# Patient Record
Sex: Male | Born: 1945 | Race: White | Hispanic: No | Marital: Married | Smoking: Former smoker
Health system: Southern US, Community
[De-identification: ages and names within clinical notes are randomized; demographics above are authoritative.]

## PROBLEM LIST (undated history)

## (undated) DIAGNOSIS — E119 Type 2 diabetes mellitus without complications: Secondary | ICD-10-CM

## (undated) DIAGNOSIS — I252 Old myocardial infarction: Secondary | ICD-10-CM

## (undated) DIAGNOSIS — R0600 Dyspnea, unspecified: Secondary | ICD-10-CM

## (undated) DIAGNOSIS — R350 Frequency of micturition: Secondary | ICD-10-CM

## (undated) DIAGNOSIS — I4819 Other persistent atrial fibrillation: Secondary | ICD-10-CM

## (undated) DIAGNOSIS — I219 Acute myocardial infarction, unspecified: Secondary | ICD-10-CM

## (undated) DIAGNOSIS — I10 Essential (primary) hypertension: Secondary | ICD-10-CM

## (undated) DIAGNOSIS — E785 Hyperlipidemia, unspecified: Secondary | ICD-10-CM

## (undated) DIAGNOSIS — M5430 Sciatica, unspecified side: Secondary | ICD-10-CM

## (undated) DIAGNOSIS — I2510 Atherosclerotic heart disease of native coronary artery without angina pectoris: Secondary | ICD-10-CM

## (undated) DIAGNOSIS — R319 Hematuria, unspecified: Secondary | ICD-10-CM

## (undated) DIAGNOSIS — K760 Fatty (change of) liver, not elsewhere classified: Secondary | ICD-10-CM

## (undated) DIAGNOSIS — I700 Atherosclerosis of aorta: Secondary | ICD-10-CM

## (undated) DIAGNOSIS — N183 Chronic kidney disease, stage 3 unspecified: Secondary | ICD-10-CM

## (undated) DIAGNOSIS — I509 Heart failure, unspecified: Secondary | ICD-10-CM

## (undated) DIAGNOSIS — I482 Chronic atrial fibrillation, unspecified: Secondary | ICD-10-CM

## (undated) DIAGNOSIS — M1990 Unspecified osteoarthritis, unspecified site: Secondary | ICD-10-CM

## (undated) DIAGNOSIS — Z7901 Long term (current) use of anticoagulants: Secondary | ICD-10-CM

## (undated) DIAGNOSIS — R9341 Abnormal radiologic findings on diagnostic imaging of renal pelvis, ureter, or bladder: Secondary | ICD-10-CM

## (undated) DIAGNOSIS — Z8619 Personal history of other infectious and parasitic diseases: Secondary | ICD-10-CM

## (undated) DIAGNOSIS — I5032 Chronic diastolic (congestive) heart failure: Secondary | ICD-10-CM

## (undated) DIAGNOSIS — C801 Malignant (primary) neoplasm, unspecified: Secondary | ICD-10-CM

## (undated) DIAGNOSIS — G4700 Insomnia, unspecified: Secondary | ICD-10-CM

## (undated) DIAGNOSIS — R0609 Other forms of dyspnea: Secondary | ICD-10-CM

## (undated) DIAGNOSIS — F419 Anxiety disorder, unspecified: Secondary | ICD-10-CM

## (undated) HISTORY — PX: KIDNEY SURGERY: SHX687

## (undated) HISTORY — DX: Other persistent atrial fibrillation: I48.19

## (undated) HISTORY — PX: COLONOSCOPY: SHX174

## (undated) HISTORY — PX: EYE SURGERY: SHX253

## (undated) HISTORY — DX: Atherosclerotic heart disease of native coronary artery without angina pectoris: I25.10

## (undated) HISTORY — PX: KNEE ARTHROSCOPY: SHX127

## (undated) HISTORY — PX: TRANSTHORACIC ECHOCARDIOGRAM: SHX275

---

## 2013-04-19 DIAGNOSIS — G4700 Insomnia, unspecified: Secondary | ICD-10-CM | POA: Insufficient documentation

## 2013-04-19 DIAGNOSIS — M25669 Stiffness of unspecified knee, not elsewhere classified: Secondary | ICD-10-CM | POA: Insufficient documentation

## 2013-04-19 DIAGNOSIS — R350 Frequency of micturition: Secondary | ICD-10-CM | POA: Insufficient documentation

## 2013-04-19 DIAGNOSIS — R972 Elevated prostate specific antigen [PSA]: Secondary | ICD-10-CM | POA: Insufficient documentation

## 2013-04-19 DIAGNOSIS — F411 Generalized anxiety disorder: Secondary | ICD-10-CM | POA: Insufficient documentation

## 2013-04-19 DIAGNOSIS — C61 Malignant neoplasm of prostate: Secondary | ICD-10-CM | POA: Insufficient documentation

## 2013-04-19 DIAGNOSIS — G44209 Tension-type headache, unspecified, not intractable: Secondary | ICD-10-CM | POA: Insufficient documentation

## 2013-04-19 DIAGNOSIS — Z8546 Personal history of malignant neoplasm of prostate: Secondary | ICD-10-CM | POA: Insufficient documentation

## 2013-04-19 DIAGNOSIS — M5430 Sciatica, unspecified side: Secondary | ICD-10-CM | POA: Insufficient documentation

## 2013-11-13 DIAGNOSIS — I219 Acute myocardial infarction, unspecified: Secondary | ICD-10-CM

## 2013-11-13 HISTORY — DX: Acute myocardial infarction, unspecified: I21.9

## 2014-02-03 ENCOUNTER — Encounter (HOSPITAL_COMMUNITY): Payer: Self-pay | Admitting: Emergency Medicine

## 2014-02-03 ENCOUNTER — Inpatient Hospital Stay (HOSPITAL_COMMUNITY)
Admission: EM | Admit: 2014-02-03 | Discharge: 2014-02-05 | DRG: 282 | Disposition: A | Discharge status: Home / Self Care | Payer: Medicare Other | Attending: Cardiology | Admitting: Cardiology

## 2014-02-03 ENCOUNTER — Emergency Department (HOSPITAL_COMMUNITY): Payer: Medicare Other

## 2014-02-03 DIAGNOSIS — I252 Old myocardial infarction: Secondary | ICD-10-CM | POA: Diagnosis present

## 2014-02-03 DIAGNOSIS — I2541 Coronary artery aneurysm: Secondary | ICD-10-CM | POA: Diagnosis present

## 2014-02-03 DIAGNOSIS — Z79899 Other long term (current) drug therapy: Secondary | ICD-10-CM

## 2014-02-03 DIAGNOSIS — I200 Unstable angina: Secondary | ICD-10-CM | POA: Diagnosis present

## 2014-02-03 DIAGNOSIS — E785 Hyperlipidemia, unspecified: Secondary | ICD-10-CM | POA: Diagnosis present

## 2014-02-03 DIAGNOSIS — R079 Chest pain, unspecified: Secondary | ICD-10-CM | POA: Insufficient documentation

## 2014-02-03 DIAGNOSIS — I2510 Atherosclerotic heart disease of native coronary artery without angina pectoris: Secondary | ICD-10-CM | POA: Diagnosis present

## 2014-02-03 DIAGNOSIS — N289 Disorder of kidney and ureter, unspecified: Secondary | ICD-10-CM | POA: Diagnosis present

## 2014-02-03 DIAGNOSIS — I214 Non-ST elevation (NSTEMI) myocardial infarction: Principal | ICD-10-CM | POA: Diagnosis present

## 2014-02-03 DIAGNOSIS — I10 Essential (primary) hypertension: Secondary | ICD-10-CM | POA: Diagnosis present

## 2014-02-03 HISTORY — DX: Unspecified osteoarthritis, unspecified site: M19.90

## 2014-02-03 HISTORY — DX: Essential (primary) hypertension: I10

## 2014-02-03 LAB — BASIC METABOLIC PANEL
BUN: 27 mg/dL — ABNORMAL HIGH (ref 6–23)
CALCIUM: 9.4 mg/dL (ref 8.4–10.5)
CO2: 26 mEq/L (ref 19–32)
CREATININE: 1.46 mg/dL — AB (ref 0.50–1.35)
Chloride: 99 mEq/L (ref 96–112)
GFR calc non Af Amer: 48 mL/min — ABNORMAL LOW (ref >90)
GFR, EST AFRICAN AMERICAN: 56 mL/min — AB (ref >90)
Glucose, Bld: 168 mg/dL — ABNORMAL HIGH (ref 70–99)
Potassium: 4 mEq/L (ref 3.7–5.3)
Sodium: 139 mEq/L (ref 137–147)

## 2014-02-03 LAB — I-STAT TROPONIN, ED: TROPONIN I, POC: 0.17 ng/mL — AB (ref 0.00–0.08)

## 2014-02-03 LAB — CBC
HEMATOCRIT: 42.4 % (ref 39.0–52.0)
Hemoglobin: 14.9 g/dL (ref 13.0–17.0)
MCH: 30.3 pg (ref 26.0–34.0)
MCHC: 35.1 g/dL (ref 30.0–36.0)
MCV: 86.2 fL (ref 78.0–100.0)
PLATELETS: 227 10*3/uL (ref 150–400)
RBC: 4.92 MIL/uL (ref 4.22–5.81)
RDW: 13.1 % (ref 11.5–15.5)
WBC: 10 10*3/uL (ref 4.0–10.5)

## 2014-02-03 LAB — TROPONIN I
TROPONIN I: 0.37 ng/mL — AB (ref <0.30)
Troponin I: 0.41 ng/mL (ref <0.30)

## 2014-02-03 MED ORDER — ASPIRIN 81 MG PO CHEW
81.0000 mg | CHEWABLE_TABLET | ORAL | Status: AC
  Administered 2014-02-04: 81 mg via ORAL
  Filled 2014-02-03: qty 1

## 2014-02-03 MED ORDER — HEPARIN (PORCINE) IN NACL 100-0.45 UNIT/ML-% IJ SOLN
1350.0000 [IU]/h | INTRAMUSCULAR | Status: DC
  Administered 2014-02-03: 1150 [IU]/h via INTRAVENOUS
  Filled 2014-02-03 (×2): qty 250

## 2014-02-03 MED ORDER — HEPARIN SODIUM (PORCINE) 5000 UNIT/ML IJ SOLN: 60.0000 [IU]/kg | Freq: Once | INTRAMUSCULAR | Status: DC

## 2014-02-03 MED ORDER — NITROGLYCERIN 0.4 MG SL SUBL: 0.4000 mg | SUBLINGUAL_TABLET | SUBLINGUAL | Status: DC | PRN

## 2014-02-03 MED ORDER — METOPROLOL TARTRATE 25 MG PO TABS
25.0000 mg | ORAL_TABLET | Freq: Two times a day (BID) | ORAL | Status: DC
  Administered 2014-02-03 – 2014-02-05 (×4): 25 mg via ORAL
  Filled 2014-02-03 (×5): qty 1

## 2014-02-03 MED ORDER — ASPIRIN EC 81 MG PO TBEC
81.0000 mg | DELAYED_RELEASE_TABLET | Freq: Every day | ORAL | Status: DC
  Administered 2014-02-04 – 2014-02-05 (×2): 81 mg via ORAL
  Filled 2014-02-03 (×2): qty 1

## 2014-02-03 MED ORDER — ASPIRIN 325 MG PO TABS
325.0000 mg | ORAL_TABLET | Freq: Once | ORAL | Status: AC
  Administered 2014-02-03: 325 mg via ORAL
  Filled 2014-02-03: qty 1

## 2014-02-03 MED ORDER — SODIUM CHLORIDE 0.9 % IJ SOLN: 3.0000 mL | Freq: Two times a day (BID) | INTRAMUSCULAR | Status: DC

## 2014-02-03 MED ORDER — SODIUM CHLORIDE 0.9 % IV SOLN
1.0000 mL/kg/h | INTRAVENOUS | Status: DC
  Administered 2014-02-04 (×2): 1 mL/kg/h via INTRAVENOUS

## 2014-02-03 MED ORDER — HEPARIN BOLUS VIA INFUSION
4000.0000 [IU] | Freq: Once | INTRAVENOUS | Status: AC
  Administered 2014-02-03: 4000 [IU] via INTRAVENOUS
  Filled 2014-02-03: qty 4000

## 2014-02-03 MED ORDER — SODIUM CHLORIDE 0.9 % IV SOLN: 250.0000 mL | INTRAVENOUS | Status: DC | PRN

## 2014-02-03 MED ORDER — ZOLPIDEM TARTRATE 5 MG PO TABS
5.0000 mg | ORAL_TABLET | Freq: Every day | ORAL | Status: DC
  Administered 2014-02-03 – 2014-02-04 (×2): 5 mg via ORAL
  Filled 2014-02-03 (×2): qty 1

## 2014-02-03 MED ORDER — SODIUM CHLORIDE 0.9 % IJ SOLN: 3.0000 mL | INTRAMUSCULAR | Status: DC | PRN

## 2014-02-03 MED ORDER — ASPIRIN 81 MG PO CHEW: 81.0000 mg | CHEWABLE_TABLET | ORAL | Status: DC

## 2014-02-03 NOTE — H&P (Signed)
CARDIOLOGY ADMISSION NOTE  Patient ID: Adam Harris MRN: [id number] DOB/AGE: 1946-10-20 68 y.o.  Admit date: 02/03/2014 Primary Physician   [place] [facility] Primary Cardiologist   None Chief Complaint    Chest pain  HPI:  The patient presents for evaluation of chest pain.  He actually had chest pain about 2 weeks ago.  This happened after he walked up some stairs.  Is was 4/10and mid to left chest.  He had no associated symptoms.  He went to bed and it went away.  Two days ago, again climbing the stairs, he had a similar episode although less severe.  He decided today to have an appointment with his primary MD.  Apparently a troponin was sent and was elevated.  He was told to come to the ED.  Here again the troponin was elevated at 0.17.    However, the patient has had no symptoms in the past two days.  Denies ongoing dyspnea, PND or orthopnea.    Past Medical History  Diagnosis Date  . Hypertension   . Arthritis     History reviewed. No pertinent past surgical history.  No Known Allergies  Prior to Admission medications   Medication Sig Start Date End Date Taking? Authorizing Provider  diphenhydramine-acetaminophen (TYLENOL PM) 25-500 MG TABS Take 1 tablet by mouth at bedtime. Take every night for sleep per patient   Yes Historical Provider, MD  ibuprofen (ADVIL,MOTRIN) 200 MG tablet Take 1,200 mg by mouth every 6 (six) hours as needed. Take 6 tablets every day per patient for knee pain   Yes Historical Provider, MD  Olmesartan-Amlodipine-HCTZ (TRIBENZOR) 40-5-25 MG TABS Take 1 tablet by mouth daily.   Yes Historical Provider, MD   History   Social History  . Marital Status: Married    Spouse Name: N/A    Number of Children: N/A  . Years of Education: N/A   Occupational History  . Not on file.   Social History Main Topics  . Smoking status: Never Smoker   . Smokeless tobacco: Not on file  . Alcohol Use: No  . Drug Use: Not on file  . Sexual Activity: Not on  file   Other Topics Concern  . Not on file   Social History Narrative  . No narrative on file    History reviewed. No pertinent family history.   ROS:  Positive for knee pain.  Otherwise as stated in the HPI and negative for all other systems.  Physical Exam: Blood pressure 113/69, pulse 65, resp. rate 17, height 5\' 11"  (1.803 m), weight 242 lb (109.77 kg), SpO2 98.00%.  GENERAL:  Well appearing HEENT:  Pupils equal round and reactive, fundi not visualized, oral mucosa unremarkable NECK:  No jugular venous distention, waveform within normal limits, carotid upstroke brisk and symmetric, no bruits, no thyromegaly LYMPHATICS:  No cervical, inguinal adenopathy LUNGS:  Clear to auscultation bilaterally BACK:  No CVA tenderness CHEST:  Unremarkable HEART:  PMI not displaced or sustained,S1 and S2 within normal limits, no S3, no S4, no clicks, no rubs, no murmurs ABD:  Flat, positive bowel sounds normal in frequency in pitch, no bruits, no rebound, no guarding, no midline pulsatile mass, no hepatomegaly, no splenomegaly EXT:  2 plus pulses throughout, no edema, no cyanosis no clubbing SKIN:  No rashes no nodules NEURO:  Cranial nerves II through XII grossly intact, motor grossly intact throughout PSYCH:  Cognitively intact, oriented to person place and time   Labs: Lab Results  Component  Value Date   BUN 27* 02/03/2014   Lab Results  Component Value Date   CREATININE 1.46* 02/03/2014   Lab Results  Component Value Date   NA 139 02/03/2014   K 4.0 02/03/2014   CL 99 02/03/2014   CO2 26 02/03/2014   Lab Results  Component Value Date   TROPONINI 0.37* 02/03/2014   Lab Results  Component Value Date   WBC 10.0 02/03/2014   HGB 14.9 02/03/2014   HCT 42.4 02/03/2014   MCV 86.2 02/03/2014   PLT 227 02/03/2014     Radiology:  CXR:  The cardiac and mediastinal silhouettes are within normal limits.  The lungs are normally inflated. No airspace consolidation, pleural  effusion, or  pulmonary edema is identified. There is no  pneumothorax.  No acute osseous abnormality identified. Mild multilevel  degenerative disc disease seen within the visualized spine.   EKG:  Sinus rhythm, rate 70, axis within normal limits, intervals within normal limits, no acute ST-T wave changes.  ASSESSMENT AND PLAN:    CHEST PAIN:  The pain has typical and atypical features.  However, his cardiac enzymes are elevated.  Therefore he will need cardiac cath in the AM.  The patient understands that risks included but are not limited to stroke (1 in 1000), death (1 in 1000), kidney failure [usually temporary] (1 in 500), bleeding (1 in 200), allergic reaction [possibly serious] (1 in 200).  The patient understands and agrees to proceed.   HTN:  I will hold his ARB/HCTZ pending cath.    ELEVATED CREATININE:   As above.  I will check a creat in the AM.     Signed: Minus Breeding 02/03/2014, 6:00 PM

## 2014-02-03 NOTE — ED Notes (Signed)
NOTIFIED DR. ZACKOWSKI IN PERSON OF PATIENTS PANIC LAB RESULTS OF I-STAT TROPONIN @16 :05 PM ,02/03/2014.

## 2014-02-03 NOTE — Progress Notes (Signed)
ANTICOAGULATION CONSULT NOTE - Initial Consult  Pharmacy Consult for Heparin Indication: chest pain/ACS  No Known Allergies  Patient Measurements: Height: 5\' 11"  (180.3 cm) (Patient reported) Weight: 242 lb (109.77 kg) (Patient reported ) IBW/kg (Calculated) : 75.3 Heparin Dosing Weight: 99 kg   Vital Signs: BP: 113/69 mmHg (03/24 1715) Pulse Rate: 65 (03/24 1715)  Labs:  Recent Labs  02/03/14 1538 02/03/14 1612  HGB 14.9  --   HCT 42.4  --   PLT 227  --   CREATININE 1.46*  --   TROPONINI  --  0.37*    Estimated Creatinine Clearance: 61.9 ml/min (by C-G formula based on Cr of 1.46).   Medical History: Past Medical History  Diagnosis Date  . Hypertension   . Arthritis     Medications:   (Not in a hospital admission)  Assessment: 89 YOM with chest pain to start heparin for ACS/STEMI. Troponin was elevated at 0.17. Hgb and Plt wnl. He is not on any chronic anticoagulation prior to admission.   Goal of Therapy:  Heparin level 0.3-0.7 units/ml Monitor platelets by anticoagulation protocol: Yes   Plan:  Give 4000 units bolus x 1 Start heparin infusion at 1150 units/hr Check anti-Xa level in 6 hours and daily while on heparin Continue to monitor H&H and platelets  Albertina Parr, PharmD.  Clinical Pharmacist Pager [phone]

## 2014-02-03 NOTE — ED Notes (Signed)
Per pt sts that he has been having episodes of chest pain and heaviness intermittently over the past few week. sts some heaviness now. Denies SOB.

## 2014-02-03 NOTE — ED Notes (Signed)
MD at bedside.  Cardiologist.

## 2014-02-03 NOTE — ED Notes (Signed)
PA Student at bedside

## 2014-02-03 NOTE — ED Provider Notes (Signed)
CSN: [id number]     Arrival date & time 02/03/14  1515 History   First MD Initiated Contact with Patient 02/03/14 1546     Chief Complaint  Patient presents with  . Chest Pain     (Consider location/radiation/quality/duration/timing/severity/associated sxs/prior Treatment) HPI Comments: Patient is a 68 year old male with past medical history of hypertension and arthritis who presents to the emergency department from his primary care physician's office with intermittent chest pain x2 weeks. Patient states 2 weeks ago he had an episode of midsternal chest pain described as a cramping, radiating towards his left shoulder rated 4/10. Chest pain lasted about 30 minutes and subsided on its own. He was asymptomatic until 2 days ago when the chest pain returned, lasting about 30 minutes and subsiding without any intervention. Pain has not returned since, however he decided to be checked out as primary care physician's office, he had blood work done and was advised to go to the emergency department because of an abnormal lab value. Denies ever having chest pain like this in the past. Denies associated shortness of breath, nausea, vomiting or diaphoresis, no recent cough. Denies any cardiac history. Nonsmoker.  Patient is a 68 y.o. male presenting with chest pain. The history is provided by the patient and the spouse.  Chest Pain   Past Medical History  Diagnosis Date  . Hypertension   . Arthritis    Past Surgical History  Procedure Laterality Date  . Knee arthroscopy     Family History  Problem Relation Age of Onset  . Hypertension Mother    History  Substance Use Topics  . Smoking status: Never Smoker   . Smokeless tobacco: Not on file  . Alcohol Use: No    Review of Systems  Cardiovascular: Positive for chest pain.  All other systems reviewed and are negative.      Allergies  Review of patient's allergies indicates no known allergies.  Home Medications   Current Outpatient  Rx  Name  Route  Sig  Dispense  Refill  . diphenhydramine-acetaminophen (TYLENOL PM) 25-500 MG TABS   Oral   Take 1 tablet by mouth at bedtime. Take every night for sleep per patient         . ibuprofen (ADVIL,MOTRIN) 200 MG tablet   Oral   Take 1,200 mg by mouth every 6 (six) hours as needed. Take 6 tablets every day per patient for knee pain         . Olmesartan-Amlodipine-HCTZ (TRIBENZOR) 40-5-25 MG TABS   Oral   Take 1 tablet by mouth daily.          BP 132/77  Pulse 70  Resp 18  Ht 5\' 11"  (1.803 m)  Wt 242 lb (109.77 kg)  BMI 33.77 kg/m2  SpO2 99% Physical Exam  Nursing note and vitals reviewed. Constitutional: He is oriented to person, place, and time. He appears well-developed and well-nourished. No distress.  HENT:  Head: Normocephalic and atraumatic.  Mouth/Throat: Oropharynx is clear and moist.  Eyes: Conjunctivae and EOM are normal. Pupils are equal, round, and reactive to light.  Neck: Normal range of motion. Neck supple. No JVD present.  Cardiovascular: Normal rate, regular rhythm, normal heart sounds and intact distal pulses.   No extremity edema.  Pulmonary/Chest: Effort normal and breath sounds normal. No respiratory distress.  Abdominal: Soft. Bowel sounds are normal. There is no tenderness.  Musculoskeletal: Normal range of motion. He exhibits no edema.  Neurological: He is alert and oriented to person,  place, and time. He has normal strength. No sensory deficit.  Speech fluent, goal oriented. Moves limbs without ataxia. Equal grip strength bilateral.  Skin: Skin is warm and dry. He is not diaphoretic.  Psychiatric: He has a normal mood and affect. His behavior is normal.    ED Course  Procedures (including critical care time) Labs Review Labs Reviewed  BASIC METABOLIC PANEL - Abnormal; Notable for the following:    Glucose, Bld 168 (*)    BUN 27 (*)    Creatinine, Ser 1.46 (*)    GFR calc non Af Amer 48 (*)    GFR calc Af Amer 56 (*)     All other components within normal limits  TROPONIN I - Abnormal; Notable for the following:    Troponin I 0.37 (*)    All other components within normal limits  I-STAT TROPOININ, ED - Abnormal; Notable for the following:    Troponin i, poc 0.17 (*)    All other components within normal limits  CBC  HEPARIN LEVEL (UNFRACTIONATED)   Imaging Review Dg Chest 2 View  02/03/2014   CLINICAL DATA:  Left upper chest pain, hypertension  EXAM: CHEST  2 VIEW  COMPARISON:  None available  FINDINGS: The cardiac and mediastinal silhouettes are within normal limits.  The lungs are normally inflated. No airspace consolidation, pleural effusion, or pulmonary edema is identified. There is no pneumothorax.  No acute osseous abnormality identified. Mild multilevel degenerative disc disease seen within the visualized spine.  IMPRESSION: No active cardiopulmonary disease.   Electronically Signed   By: Jeannine Boga M.D.   On: 02/03/2014 16:05     EKG Interpretation   Date/Time:  Tuesday February 03 2014 16:20:29 EDT Ventricular Rate:  70 PR Interval:  182 QRS Duration: 84 QT Interval:  391 QTC Calculation: 422 R Axis:   57 Text Interpretation:  Sinus rhythm Nonspecific repol abnormality, lateral  leads No significant change since last tracing Confirmed by ZACKOWSKI  MD,  SCOTT [id number]) on 02/03/2014 4:27:12 PM      MDM   Final diagnoses:  NSTEMI (non-ST elevated myocardial infarction)   Presenting with 2 episodes of chest pain over the past 2 weeks. Currently asymptomatic. He is well appearing and in no apparent distress. Vital signs stable. Labs obtained in triage prior to patient being seen, i-STAT troponin I elevated at 0.17. EKG showing nonspecific ST abnormalities in leads 2, aVR, V1, V4, V5 and V6. EKG repeated and unchanged. Plan to obtain regular lab troponin. Case discussed with attending Dr. Rogene Houston who agrees with plan of care.  Regular lab troponin elevated at 0.37. Heparin bolus and  drip started. Cardiology consult is appreciated, patient admitted by Dr. Percival Spanish for cardiac catheterization tomorrow.  Illene Labrador, PA-C 02/03/14 [phone]

## 2014-02-03 NOTE — ED Provider Notes (Addendum)
Medical screening examination/treatment/procedure(s) were conducted as a shared visit with non-physician practitioner(s) and myself.  I personally evaluated the patient during the encounter.   EKG Interpretation   Date/Time:  Tuesday February 03 2014 16:20:29 EDT Ventricular Rate:  70 PR Interval:  182 QRS Duration: 84 QT Interval:  391 QTC Calculation: 422 R Axis:   57 Text Interpretation:  Sinus rhythm Nonspecific repol abnormality, lateral  leads No significant change since last tracing Confirmed by Latissa Frick  MD,  Markelle Asaro [phone]) on 02/03/2014 4:27:12 PM       Patient seen by me. The patient referred in from primary care doctor's office after lab work was suspicious for abnormal cardiac enzymes. Patient had some sniffing and chest pain about 2 weeks ago had some mild discomfort yesterday and today. No history of coronary disease. Does have a history of hypertension. The patient's point-of-care troponin here was elevated. Patient further troponin also came back elevated. His be consistent with a non-STEMI. We do not have significant pain at this time. Patient given aspirin. We'll hold on the nitroglycerin but we'll start heparin. We'll discuss with cardiology. This all would be consistent with a non-STEMI. EKG as above did have some subtle changes in V1 with some ST segment elevation and some subtle depression in the lateral leads and inferior leads. No old EKG to compare it with. We did do 2 EKGs here and there was no significant change in them.    Results for orders placed during the hospital encounter of 02/03/14  CBC      Result Value Ref Range   WBC 10.0  4.0 - 10.5 K/uL   RBC 4.92  4.22 - 5.81 MIL/uL   Hemoglobin 14.9  13.0 - 17.0 g/dL   HCT 42.4  39.0 - 52.0 %   MCV 86.2  78.0 - 100.0 fL   MCH 30.3  26.0 - 34.0 pg   MCHC 35.1  30.0 - 36.0 g/dL   RDW 13.1  11.5 - 15.5 %   Platelets 227  150 - 400 K/uL  BASIC METABOLIC PANEL      Result Value Ref Range   Sodium 139  137 - 147  mEq/L   Potassium 4.0  3.7 - 5.3 mEq/L   Chloride 99  96 - 112 mEq/L   CO2 26  19 - 32 mEq/L   Glucose, Bld 168 (*) 70 - 99 mg/dL   BUN 27 (*) 6 - 23 mg/dL   Creatinine, Ser 1.46 (*) 0.50 - 1.35 mg/dL   Calcium 9.4  8.4 - 10.5 mg/dL   GFR calc non Af Amer 48 (*) >90 mL/min   GFR calc Af Amer 56 (*) >90 mL/min  TROPONIN I      Result Value Ref Range   Troponin I 0.37 (*) <0.30 ng/mL  I-STAT TROPOININ, ED      Result Value Ref Range   Troponin i, poc 0.17 (*) 0.00 - 0.08 ng/mL   Comment NOTIFIED PHYSICIAN     Comment 3            Dg Chest 2 View  02/03/2014   CLINICAL DATA:  Left upper chest pain, hypertension  EXAM: CHEST  2 VIEW  COMPARISON:  None available  FINDINGS: The cardiac and mediastinal silhouettes are within normal limits.  The lungs are normally inflated. No airspace consolidation, pleural effusion, or pulmonary edema is identified. There is no pneumothorax.  No acute osseous abnormality identified. Mild multilevel degenerative disc disease seen within the visualized spine.  IMPRESSION: No active cardiopulmonary disease.   Electronically Signed   By: Jeannine Boga M.D.   On: 02/03/2014 16:05    CRITICAL CARE Performed by: Mervin Kung. Total critical care time: 30 Critical care time was exclusive of separately billable procedures and treating other patients. Critical care was necessary to treat or prevent imminent or life-threatening deterioration. Critical care was time spent personally by me on the following activities: development of treatment plan with patient and/or surrogate as well as nursing, discussions with consultants, evaluation of patient's response to treatment, examination of patient, obtaining history from patient or surrogate, ordering and performing treatments and interventions, ordering and review of laboratory studies, ordering and review of radiographic studies, pulse oximetry and re-evaluation of patient's condition.   Mervin Kung,  MD 02/03/14 [place], MD 02/03/14 [phone]

## 2014-02-03 NOTE — ED Notes (Addendum)
Notified Dr. Venita Sheffield and PA Michele Mcalpine

## 2014-02-03 NOTE — ED Notes (Signed)
Cardiac Diet Tray ordered for pt. At Cardiologist Request.

## 2014-02-04 ENCOUNTER — Encounter (HOSPITAL_COMMUNITY)
Admission: EM | Disposition: A | Discharge status: Home / Self Care | Payer: Self-pay | Source: Home / Self Care | Attending: Cardiology

## 2014-02-04 ENCOUNTER — Institutional Professional Consult (permissible substitution): Payer: Self-pay | Admitting: Cardiology

## 2014-02-04 DIAGNOSIS — E785 Hyperlipidemia, unspecified: Secondary | ICD-10-CM

## 2014-02-04 DIAGNOSIS — I214 Non-ST elevation (NSTEMI) myocardial infarction: Secondary | ICD-10-CM | POA: Diagnosis present

## 2014-02-04 DIAGNOSIS — I2510 Atherosclerotic heart disease of native coronary artery without angina pectoris: Secondary | ICD-10-CM

## 2014-02-04 HISTORY — PX: LEFT HEART CATHETERIZATION WITH CORONARY ANGIOGRAM: SHX5451

## 2014-02-04 LAB — BASIC METABOLIC PANEL
BUN: 24 mg/dL — ABNORMAL HIGH (ref 6–23)
CHLORIDE: 100 meq/L (ref 96–112)
CO2: 26 mEq/L (ref 19–32)
Calcium: 9.2 mg/dL (ref 8.4–10.5)
Creatinine, Ser: 1.42 mg/dL — ABNORMAL HIGH (ref 0.50–1.35)
GFR calc non Af Amer: 50 mL/min — ABNORMAL LOW (ref >90)
GFR, EST AFRICAN AMERICAN: 58 mL/min — AB (ref >90)
Glucose, Bld: 131 mg/dL — ABNORMAL HIGH (ref 70–99)
POTASSIUM: 3.9 meq/L (ref 3.7–5.3)
Sodium: 140 mEq/L (ref 137–147)

## 2014-02-04 LAB — CBC
HEMATOCRIT: 40 % (ref 39.0–52.0)
HEMOGLOBIN: 13.8 g/dL (ref 13.0–17.0)
MCH: 29.9 pg (ref 26.0–34.0)
MCHC: 34.5 g/dL (ref 30.0–36.0)
MCV: 86.8 fL (ref 78.0–100.0)
Platelets: 178 10*3/uL (ref 150–400)
RBC: 4.61 MIL/uL (ref 4.22–5.81)
RDW: 13 % (ref 11.5–15.5)
WBC: 9.4 10*3/uL (ref 4.0–10.5)

## 2014-02-04 LAB — HEPARIN LEVEL (UNFRACTIONATED)
Heparin Unfractionated: 0.28 IU/mL — ABNORMAL LOW (ref 0.30–0.70)
Heparin Unfractionated: 0.36 IU/mL (ref 0.30–0.70)

## 2014-02-04 LAB — PROTIME-INR
INR: 0.99 (ref 0.00–1.49)
Prothrombin Time: 12.9 seconds (ref 11.6–15.2)

## 2014-02-04 LAB — TROPONIN I
TROPONIN I: 0.32 ng/mL — AB (ref <0.30)
Troponin I: 0.36 ng/mL (ref <0.30)

## 2014-02-04 LAB — LIPID PANEL
CHOL/HDL RATIO: 4.5 ratio
CHOLESTEROL: 154 mg/dL (ref 0–200)
HDL: 34 mg/dL — ABNORMAL LOW (ref >39)
LDL Cholesterol: 96 mg/dL (ref 0–99)
TRIGLYCERIDES: 122 mg/dL (ref <150)
VLDL: 24 mg/dL (ref 0–40)

## 2014-02-04 LAB — TSH: TSH: 3.527 u[IU]/mL (ref 0.350–4.500)

## 2014-02-04 SURGERY — LEFT HEART CATHETERIZATION WITH CORONARY ANGIOGRAM
Anesthesia: LOCAL

## 2014-02-04 MED ORDER — SODIUM CHLORIDE 0.9 % IV SOLN: INTRAVENOUS | Status: AC

## 2014-02-04 MED ORDER — CLOPIDOGREL BISULFATE 300 MG PO TABS
ORAL_TABLET | ORAL | Status: AC
  Administered 2014-02-05: 75 mg via ORAL
  Filled 2014-02-04: qty 2

## 2014-02-04 MED ORDER — VERAPAMIL HCL 2.5 MG/ML IV SOLN
INTRAVENOUS | Status: AC
  Filled 2014-02-04: qty 2

## 2014-02-04 MED ORDER — ATORVASTATIN CALCIUM 20 MG PO TABS
20.0000 mg | ORAL_TABLET | Freq: Every day | ORAL | Status: DC
  Administered 2014-02-04 – 2014-02-05 (×2): 20 mg via ORAL
  Filled 2014-02-04 (×2): qty 1

## 2014-02-04 MED ORDER — CLOPIDOGREL BISULFATE 75 MG PO TABS
75.0000 mg | ORAL_TABLET | Freq: Every day | ORAL | Status: DC
  Administered 2014-02-05: 75 mg via ORAL
  Filled 2014-02-04: qty 1

## 2014-02-04 MED ORDER — MIDAZOLAM HCL 2 MG/2ML IJ SOLN
INTRAMUSCULAR | Status: AC
  Filled 2014-02-04: qty 2

## 2014-02-04 MED ORDER — HEPARIN SODIUM (PORCINE) 1000 UNIT/ML IJ SOLN
INTRAMUSCULAR | Status: AC
  Filled 2014-02-04: qty 1

## 2014-02-04 MED ORDER — HEPARIN (PORCINE) IN NACL 2-0.9 UNIT/ML-% IJ SOLN
INTRAMUSCULAR | Status: AC
  Filled 2014-02-04: qty 1000

## 2014-02-04 MED ORDER — LIDOCAINE HCL (PF) 1 % IJ SOLN
INTRAMUSCULAR | Status: AC
  Filled 2014-02-04: qty 30

## 2014-02-04 MED ORDER — GUAIFENESIN-DM 100-10 MG/5ML PO SYRP
5.0000 mL | ORAL_SOLUTION | ORAL | Status: DC | PRN
  Administered 2014-02-04: 5 mL via ORAL
  Filled 2014-02-04: qty 5

## 2014-02-04 MED ORDER — FENTANYL CITRATE 0.05 MG/ML IJ SOLN
INTRAMUSCULAR | Status: AC
  Filled 2014-02-04: qty 2

## 2014-02-04 NOTE — CV Procedure (Signed)
   Cardiac Catheterization Procedure Note  Name: Adam Harris MRN: [id number] DOB: 03/26/1946  Procedure: Left Heart Cath, Selective Coronary Angiography, LV angiography  Indication: Non-ST elevation myocardial infarction.  Medications:  Sedation:  1 mg IV Versed, 50 mcg IV Fentanyl  Contrast:  55 ML Omnipaque   Procedural Details: The right wrist was prepped, draped, and anesthetized with 1% lidocaine. Using the modified Seldinger technique, a 5 French sheath was introduced into the right radial artery. 3 mg of verapamil was administered through the sheath, weight-based unfractionated heparin was administered intravenously. A Jackie catheter was used for selective coronary angiography and left ventricular angiography.  There were no immediate procedural complications. A TR band was used for radial hemostasis at the completion of the procedure.  The patient was transferred to the post catheterization recovery area for further monitoring.  Procedural Findings:  Hemodynamics: AO:  136/83   mmHg LV:  137/16    mmHg LVEDP: 19  mmHg  Coronary angiography: Coronary dominance: Codominant   Left Main:  Normal  Left Anterior Descending (LAD):  Normal in size. There is 30% proximal stenosis. There is diffuse 20% disease in the midsegment there are faint left-to-right collaterals.  1st diagonal (D1):  Large in size with 20% proximal stenosis.  2nd diagonal (D2):  Normal in size with minor irregularities.  3rd diagonal (D3):  Very small in size.  Circumflex (LCx):  Large aneurysmal vessel especially in the midsegment with sluggish coronary flow. There is diffuse 20% disease proximally. There is 50% stenosis in the midsegment.  1st obtuse marginal:  Small in size with minor irregularities.  2nd obtuse marginal:  Normal in size with 20% ostial disease.  3rd obtuse marginal:  Normal in size with 20% proximal disease.   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 There is  50% proximal disease. This gives 2 normal size posterolateral branches which are free of significant disease and a small PDA with minor irregularities.   Right Coronary Artery: Seems to be medium in size and occluded proximally.   Left ventriculography: Left ventricular systolic function is normal , LVEF is estimated at 55-60 %, there is no significant mitral regurgitation   Final Conclusions:   1. 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 The vessel overall seems to be small to medium in size and codominant. 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 2. Normal LV systolic function with moderately elevated left ventricular end-diastolic pressure.  Recommendations:  Given aneurysmal changes of the left circumflex with sluggish coronary flow, I recommend lifelong dual antiplatelet therapy if tolerated. He was started on Plavix. Continue aggressive medical therapy for coronary artery disease. Expect discharge tomorrow.  [name] MD, [facility] 02/04/2014, 2:04 PM

## 2014-02-04 NOTE — Progress Notes (Signed)
ANTICOAGULATION CONSULT NOTE   Pharmacy Consult for Heparin Indication: chest pain/ACS  No Known Allergies  Patient Measurements: Height: 5\' 11"  (180.3 cm) Weight: 240 lb 14.4 oz (109.272 kg) IBW/kg (Calculated) : 75.3 Heparin Dosing Weight: 99 kg   Vital Signs: Temp: 98.4 F (36.9 C) (03/24 2036) Temp src: Oral (03/24 2036) BP: 153/84 mmHg (03/24 2036) Pulse Rate: 80 (03/24 2036)  Labs:  Recent Labs  02/03/14 1538 02/03/14 1612 02/03/14 2235 02/04/14 0257  HGB 14.9  --   --  13.8  HCT 42.4  --   --  40.0  PLT 227  --   --  178  HEPARINUNFRC  --   --   --  0.28*  CREATININE 1.46*  --   --   --   TROPONINI  --  0.37* 0.41*  --     Estimated Creatinine Clearance: 61.7 ml/min (by C-G formula based on Cr of 1.46).   Medical History: Past Medical History  Diagnosis Date  . Hypertension   . Arthritis     Medications:  Prescriptions prior to admission  Medication Sig Dispense Refill  . diphenhydramine-acetaminophen (TYLENOL PM) 25-500 MG TABS Take 1 tablet by mouth at bedtime. Take every night for sleep per patient      . ibuprofen (ADVIL,MOTRIN) 200 MG tablet Take 1,200 mg by mouth every 6 (six) hours as needed. Take 6 tablets every day per patient for knee pain      . Olmesartan-Amlodipine-HCTZ (TRIBENZOR) 40-5-25 MG TABS Take 1 tablet by mouth daily.        Assessment: 63 YOM with chest pain to start heparin for ACS/STEMI. Troponin was elevated at 0.17. Hgb and Plt wnl. He is not on any chronic anticoagulation prior to admission. His initial heparin level is slightly less than goal.  Goal of Therapy:  Heparin level 0.3-0.7 units/ml Monitor platelets by anticoagulation protocol: Yes   Plan:  Increase heparin infusion to 1350 units/hr Check anti-Xa level in 6 hours and daily while on heparin Continue to monitor H&H and platelets  Excell Seltzer, PharmD.  Clinical Pharmacist Pager [phone]

## 2014-02-04 NOTE — Progress Notes (Signed)
    Subjective:  No chest pain or dyspnea since hospitalization. The patient reports episodes of progressive chest discomfort with exertion over 2 weeks prior to admission  Objective:  Vital Signs in the last 24 hours: Temp:  [98.4 F (36.9 C)-99 F (37.2 C)] 99 F (37.2 C) (03/25 0601) Pulse Rate:  [62-80] 66 (03/25 0601) Resp:  [12-21] 18 (03/25 0601) BP: (113-155)/(60-99) 127/71 mmHg (03/25 0601) SpO2:  [97 %-100 %] 98 % (03/25 0601) Weight:  [109.272 kg (240 lb 14.4 oz)-109.77 kg (242 lb)] 109.272 kg (240 lb 14.4 oz) (03/24 2036)  Intake/Output from previous day: 03/24 0701 - 03/25 0700 In: -  Out: 800 [Urine:800]  Physical Exam: Pt is alert and oriented, NAD HEENT: normal Neck: JVP - normal Lungs: CTA bilaterally CV: RRR without murmur or gallop Abd: soft, NT, Positive BS, no hepatomegaly Ext: no C/C/E, distal pulses intact and equal Skin: warm/dry no rash   Lab Results:  Recent Labs  02/03/14 1538 02/04/14 0257  WBC 10.0 9.4  HGB 14.9 13.8  PLT 227 178    Recent Labs  02/03/14 1538 02/04/14 0257  NA 139 140  K 4.0 3.9  CL 99 100  CO2 26 26  GLUCOSE 168* 131*  BUN 27* 24*  CREATININE 1.46* 1.42*    Recent Labs  02/03/14 2235 02/04/14 0257  TROPONINI 0.41* 0.36*    Assessment/Plan:  1. Non-ST elevation MI. Symptoms seem consistent with acute coronary syndrome. He is currently on heparin, aspirin, and metoprolol. I am going to add a statin drug. Plans for cardiac catheterization and possible PCI today. I have reviewed the risks, indications, and alternatives to cath and PCI with the patient. He understands and agrees to proceed.  2. Hypertension. His ARB and diuretic are currently on hold because of mildly elevated creatinine in anticipation of cardiac catheterization today. Will likely resume these medicines 24-48 hours post cath.  3. Elevated creatinine. Baseline unknown, but mild renal insufficiency noted. Creatinine this morning is 1.4 to  which is essentially unchanged from yesterday. His ARB and diuretic are on hold. Will check a 2-D echocardiogram to assess left ventricular function so that ventriculography can be avoided.  4. Lipids: Total cholesterol 154, LDL is 96. Considering ACS presentation, will add atorvastatin  Disposition: Pending cardiac catheterization findings.   Sherren Mocha, M.D. 02/04/2014, 9:06 AM

## 2014-02-04 NOTE — Progress Notes (Signed)
UR Completed.  Kaelin Holford Jane [phone] 02/04/2014  

## 2014-02-04 NOTE — Interval H&P Note (Signed)
Cath Lab Visit (complete for each Cath Lab visit)  Clinical Evaluation Leading to the Procedure:   ACS: yes  Non-ACS:    Anginal Classification: CCS IV  Anti-ischemic medical therapy: Minimal Therapy (1 class of medications)  Non-Invasive Test Results: No non-invasive testing performed  Prior CABG: No previous CABG      History and Physical Interval Note:  02/04/2014 1:33 PM  Adam Harris  has presented today for surgery, with the diagnosis of CP  The various methods of treatment have been discussed with the patient and family. After consideration of risks, benefits and other options for treatment, the patient has consented to  Procedure(s): LEFT HEART CATHETERIZATION WITH CORONARY ANGIOGRAM (N/A) as a surgical intervention .  The patient's history has been reviewed, patient examined, no change in status, stable for surgery.  I have reviewed the patient's chart and labs.  Questions were answered to the patient's satisfaction.     Adam Harris

## 2014-02-04 NOTE — H&P (View-Only) (Signed)
    Subjective:  No chest pain or dyspnea since hospitalization. The patient reports episodes of progressive chest discomfort with exertion over 2 weeks prior to admission  Objective:  Vital Signs in the last 24 hours: Temp:  [98.4 F (36.9 C)-99 F (37.2 C)] 99 F (37.2 C) (03/25 0601) Pulse Rate:  [62-80] 66 (03/25 0601) Resp:  [12-21] 18 (03/25 0601) BP: (113-155)/(60-99) 127/71 mmHg (03/25 0601) SpO2:  [97 %-100 %] 98 % (03/25 0601) Weight:  [109.272 kg (240 lb 14.4 oz)-109.77 kg (242 lb)] 109.272 kg (240 lb 14.4 oz) (03/24 2036)  Intake/Output from previous day: 03/24 0701 - 03/25 0700 In: -  Out: 800 [Urine:800]  Physical Exam: Pt is alert and oriented, NAD HEENT: normal Neck: JVP - normal Lungs: CTA bilaterally CV: RRR without murmur or gallop Abd: soft, NT, Positive BS, no hepatomegaly Ext: no C/C/E, distal pulses intact and equal Skin: warm/dry no rash   Lab Results:  Recent Labs  02/03/14 1538 02/04/14 0257  WBC 10.0 9.4  HGB 14.9 13.8  PLT 227 178    Recent Labs  02/03/14 1538 02/04/14 0257  NA 139 140  K 4.0 3.9  CL 99 100  CO2 26 26  GLUCOSE 168* 131*  BUN 27* 24*  CREATININE 1.46* 1.42*    Recent Labs  02/03/14 2235 02/04/14 0257  TROPONINI 0.41* 0.36*    Assessment/Plan:  1. Non-ST elevation MI. Symptoms seem consistent with acute coronary syndrome. He is currently on heparin, aspirin, and metoprolol. I am going to add a statin drug. Plans for cardiac catheterization and possible PCI today. I have reviewed the risks, indications, and alternatives to cath and PCI with the patient. He understands and agrees to proceed.  2. Hypertension. His ARB and diuretic are currently on hold because of mildly elevated creatinine in anticipation of cardiac catheterization today. Will likely resume these medicines 24-48 hours post cath.  3. Elevated creatinine. Baseline unknown, but mild renal insufficiency noted. Creatinine this morning is 1.4 to  which is essentially unchanged from yesterday. His ARB and diuretic are on hold. Will check a 2-D echocardiogram to assess left ventricular function so that ventriculography can be avoided.  4. Lipids: Total cholesterol 154, LDL is 96. Considering ACS presentation, will add atorvastatin  Disposition: Pending cardiac catheterization findings.   Sherren Mocha, M.D. 02/04/2014, 9:06 AM

## 2014-02-05 DIAGNOSIS — E785 Hyperlipidemia, unspecified: Secondary | ICD-10-CM | POA: Diagnosis present

## 2014-02-05 LAB — BASIC METABOLIC PANEL
BUN: 18 mg/dL (ref 6–23)
CALCIUM: 8.9 mg/dL (ref 8.4–10.5)
CHLORIDE: 103 meq/L (ref 96–112)
CO2: 24 meq/L (ref 19–32)
Creatinine, Ser: 1.35 mg/dL (ref 0.50–1.35)
GFR calc non Af Amer: 53 mL/min — ABNORMAL LOW (ref >90)
GFR, EST AFRICAN AMERICAN: 61 mL/min — AB (ref >90)
Glucose, Bld: 129 mg/dL — ABNORMAL HIGH (ref 70–99)
Potassium: 4.1 mEq/L (ref 3.7–5.3)
SODIUM: 140 meq/L (ref 137–147)

## 2014-02-05 MED ORDER — ATORVASTATIN CALCIUM 20 MG PO TABS: 20.0000 mg | ORAL_TABLET | Freq: Every day | ORAL | Status: DC

## 2014-02-05 MED ORDER — METOPROLOL TARTRATE 25 MG PO TABS: 25.0000 mg | ORAL_TABLET | Freq: Two times a day (BID) | ORAL | Status: DC

## 2014-02-05 MED ORDER — NITROGLYCERIN 0.4 MG SL SUBL: 0.4000 mg | SUBLINGUAL_TABLET | SUBLINGUAL | Status: DC | PRN

## 2014-02-05 MED ORDER — ASPIRIN 81 MG PO TBEC: 81.0000 mg | DELAYED_RELEASE_TABLET | Freq: Every day | ORAL | Status: DC

## 2014-02-05 MED ORDER — OLMESARTAN-AMLODIPINE-HCTZ 40-5-25 MG PO TABS: 1.0000 | ORAL_TABLET | Freq: Every day | ORAL | Status: DC

## 2014-02-05 MED ORDER — CLOPIDOGREL BISULFATE 75 MG PO TABS: 75.0000 mg | ORAL_TABLET | Freq: Every day | ORAL | Status: DC

## 2014-02-05 NOTE — Progress Notes (Signed)
Pt provided with dc instructions and educaiton. Pt verbalized understanding. Pt has no questions at this time. IV removed with tip intact. Heart monitor cleaned and returned to front. Dorna Bloom, Rn

## 2014-02-05 NOTE — Discharge Summary (Signed)
Physician Discharge Summary     Patient ID: Adam Harris MRN: [id number] DOB/AGE: 1946-03-20 68 y.o. Cardiologist:  Hochrein(New)   Admit date: 02/03/2014 Discharge date: 02/05/2014  Admission Diagnoses:  NSTEMI  Discharge Diagnoses:  Principal Problem:   NSTEMI, initial episode of care Active Problems:   HTN (hypertension)   Intermediate coronary syndrome   Dyslipidemia   Discharged Condition: stable  Hospital Course:   The patient presents for evaluation of chest pain. He actually had chest pain about 2 weeks ago. This happened after he walked up some stairs. Is was 4/10and mid to left chest. He had no associated symptoms. He went to bed and it went away. Two days ago, again climbing the stairs, he had a similar episode although less severe. He decided today to have an appointment with his primary MD. Apparently a troponin was sent and was elevated. He was told to come to the ED. Here again the troponin was elevated at 0.17. However, the patient has had no symptoms in the past two days. Denies ongoing dyspnea, PND or orthopnea.   He was admitted and ruled in for NSTEMI.  IV heparin was added. ASA, metoprolol and statin added.  LDL at 96.  Triglycerides < 150.  Diet and exercise discussed.  Follow up coaching/encouragement required.  ARB and diuretic were held.  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 The vessel overall seems to be small to medium in size and codominant. 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  Normal LV systolic function with moderately elevated left ventricular end-diastolic pressure. Recommendations were for lifelong dual antiplatelet therapy with ASA, and plavix.  SCr improved mildly post cath.  Will resume ARB the day after DC.  The patient was seen by Dr. Burt Knack who felt he was stable for DC home.   Consults:  None  Significant Diagnostic Studies:  Coronary angiography Procedural Findings:  Hemodynamics:  AO: 136/83 mmHg  LV: 137/16 mmHg  LVEDP: 19 mmHg  Coronary angiography:  Coronary dominance: Codominant  Left Main: Normal  Left Anterior Descending (LAD): Normal in size. There is 30% proximal stenosis. There is diffuse 20% disease in the midsegment there are faint left-to-right collaterals.  1st diagonal (D1): Large in size with 20% proximal stenosis.  2nd diagonal (D2): Normal in size with minor irregularities.  3rd diagonal (D3): Very small in size.  Circumflex (LCx): Large aneurysmal vessel especially in the midsegment with sluggish coronary flow. There is diffuse 20% disease proximally. There is 50% stenosis in the midsegment.  1st obtuse marginal: Small in size with minor irregularities.  2nd obtuse marginal: Normal in size with 20% ostial disease.  3rd obtuse marginal: Normal in size with 20% proximal disease.  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 There is 50% proximal disease. This gives 2 normal size posterolateral branches which are free of significant disease and a small PDA with minor irregularities. Right Coronary Artery: Seems to be medium in size and occluded proximally. Left ventriculography: Left ventricular systolic function is normal , LVEF is estimated at 55-60 %, there is no significant mitral regurgitation  Final Conclusions:  1. 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 The vessel overall seems to be small to medium in size and codominant. 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  2. Normal LV systolic function with moderately elevated left  ventricular end-diastolic pressure.  Recommendations:  Given aneurysmal changes of the left circumflex with sluggish coronary flow, I recommend lifelong dual antiplatelet therapy if tolerated. He was  started on Plavix. Continue aggressive medical therapy for coronary artery disease. Expect discharge tomorrow.  [name] MD, [facility]  02/04/2014, 2:04 PM  Lipid Panel     Component Value Date/Time   CHOL 154 02/04/2014 0257   TRIG 122 02/04/2014 0257   HDL 34* 02/04/2014 0257   CHOLHDL 4.5 02/04/2014 0257   VLDL 24 02/04/2014 0257   LDLCALC 96 02/04/2014 0257    Treatments: See above.  Discharge Exam: Blood pressure 125/61, pulse 63, temperature 98.6 F (37 C), temperature source Oral, resp. rate 18, height 5\' 11"  (1.803 m), weight 240 lb 14.4 oz (109.272 kg), SpO2 100.00%.   Disposition: Final discharge disposition not confirmed      Discharge Orders   Future Appointments Provider Department Dept Phone   02/20/2014 9:50 AM Liliane Shi, PA-C [facility] [phone]   Future Orders Complete By Expires   Diet - low sodium heart healthy  As directed    Discharge instructions  As directed    Comments:     No lifting with your right arm for three days.  Restart Tribenzor tomorrow.   Increase activity slowly  As directed        Medication List         aspirin 81 MG EC tablet  Take 1 tablet (81 mg total) by mouth daily.     atorvastatin 20 MG tablet  Commonly known as:  LIPITOR  Take 1 tablet (20 mg total) by mouth daily at 6 PM.     clopidogrel 75 MG tablet  Commonly known as:  PLAVIX  Take 1 tablet (75 mg total) by mouth daily with breakfast.     diphenhydramine-acetaminophen 25-500 MG Tabs  Commonly known as:  TYLENOL PM  Take 1 tablet by mouth at bedtime. Take every night for sleep per patient     ibuprofen 200 MG tablet  Commonly known as:  ADVIL,MOTRIN  Take 1,200 mg by mouth every 6 (six) hours as needed. Take 6 tablets every day per patient for knee pain     metoprolol tartrate 25 MG tablet  Commonly known as:  LOPRESSOR  Take 1 tablet (25 mg total) by mouth 2 (two) times daily.     nitroGLYCERIN 0.4 MG SL tablet  Commonly known as:   NITROSTAT  Place 1 tablet (0.4 mg total) under the tongue every 5 (five) minutes x 3 doses as needed for chest pain.     Olmesartan-Amlodipine-HCTZ 40-5-25 MG Tabs  Commonly known as:  TRIBENZOR  Take 1 tablet by mouth daily.       Follow-up Information   Follow up with Richardson Dopp, PA-C On 02/20/2014. (9:50)    Specialty:  Physician Assistant   Contact information:   [address] [place] [address] [phone]       Signed: Tarri Fuller 02/05/2014, 10:13 AM

## 2014-02-05 NOTE — Progress Notes (Signed)
Subjective: No Complaints  Objective: Vital signs in last 24 hours: Temp:  [98.6 F (37 C)-99.5 F (37.5 C)] 98.6 F (37 C) (03/26 0630) Pulse Rate:  [60-74] 63 (03/26 0630) Resp:  [18] 18 (03/26 0630) BP: (125-155)/(61-101) 125/61 mmHg (03/26 0630) SpO2:  [98 %-100 %] 100 % (03/26 0630)    Intake/Output from previous day: 03/25 0701 - 03/26 0700 In: 240 [P.O.:240] Out: 1600 [Urine:1600] Intake/Output this shift:    Medications Current Facility-Administered Medications  Medication Dose Route Frequency Provider Last Rate Last Dose  . aspirin EC tablet 81 mg  81 mg Oral Daily Minus Breeding, MD   81 mg at 02/04/14 [id number]  . atorvastatin (LIPITOR) tablet 20 mg  20 mg Oral q1800 Sherren Mocha, MD   20 mg at 02/04/14 2125  . clopidogrel (PLAVIX) tablet 75 mg  75 mg Oral Q breakfast [name], MD      . guaiFENesin-dextromethorphan [facility] DM) 100-10 MG/5ML syrup 5 mL  5 mL Oral Q4H PRN Cecilie Kicks, NP   5 mL at 02/04/14 2125  . metoprolol tartrate (LOPRESSOR) tablet 25 mg  25 mg Oral BID Minus Breeding, MD   25 mg at 02/04/14 2124  . nitroGLYCERIN (NITROSTAT) SL tablet 0.4 mg  0.4 mg Sublingual Q5 Min x 3 PRN Minus Breeding, MD      . zolpidem Lorrin Mais) tablet 5 mg  5 mg Oral QHS Alwyn Pea, MD   5 mg at 02/04/14 2124    PE: General appearance: alert, cooperative and no distress Lungs: clear to auscultation bilaterally Heart: regular rate and rhythm, S1, S2 normal, no murmur, click, rub or gallop Extremities: No LEE Pulses: 2+ and symmetric Skin: Warm and dry Neurologic: Grossly normal  Lab Results:   Recent Labs  02/03/14 1538 02/04/14 0257  WBC 10.0 9.4  HGB 14.9 13.8  HCT 42.4 40.0  PLT 227 178   BMET  Recent Labs  02/03/14 1538 02/04/14 0257 02/05/14 0530  NA 139 140 140  K 4.0 3.9 4.1  CL 99 100 103  CO2 26 26 24   GLUCOSE 168* 131* 129*  BUN 27* 24* 18  CREATININE 1.46* 1.42* 1.35  CALCIUM 9.4 9.2 8.9   PT/INR  Recent  Labs  02/04/14 0257  LABPROT 12.9  INR 0.99   Cholesterol  Recent Labs  02/04/14 0257  CHOL 154   Lipid Panel     Component Value Date/Time   CHOL 154 02/04/2014 0257   TRIG 122 02/04/2014 0257   HDL 34* 02/04/2014 0257   CHOLHDL 4.5 02/04/2014 0257   VLDL 24 02/04/2014 0257   LDLCALC 96 02/04/2014 0257     Cardiac Panel (last 3 results)  Recent Labs  02/03/14 2235 02/04/14 0257 02/04/14 0930  TROPONINI 0.41* 0.36* 0.32*    Studies/Results: LHC Coronary angiography:  Coronary dominance: Codominant  Left Main: Normal  Left Anterior Descending (LAD): Normal in size. There is 30% proximal stenosis. There is diffuse 20% disease in the midsegment there are faint left-to-right collaterals.  1st diagonal (D1): Large in size with 20% proximal stenosis.  2nd diagonal (D2): Normal in size with minor irregularities.  3rd diagonal (D3): Very small in size.  Circumflex (LCx): Large aneurysmal vessel especially in the midsegment with sluggish coronary flow. There is diffuse 20% disease proximally. There is 50% stenosis in the midsegment.  1st obtuse marginal: Small in size with minor irregularities.  2nd obtuse marginal: Normal in size with 20% ostial disease.  3rd obtuse marginal:  Normal in size with 20% proximal disease.  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 There is 50% proximal disease. This gives 2 normal size posterolateral branches which are free of significant disease and a small PDA with minor irregularities. Right Coronary Artery: Seems to be medium in size and occluded proximally. Left ventriculography: Left ventricular systolic function is normal , LVEF is estimated at 55-60 %, there is no significant mitral regurgitation  Final Conclusions:  1. 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 The vessel overall seems to be small to medium in size and codominant.  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  2. Normal LV systolic function with moderately elevated left ventricular end-diastolic pressure.  Recommendations:  Given aneurysmal changes of the left circumflex with sluggish coronary flow, I recommend lifelong dual antiplatelet therapy if tolerated. He was started on Plavix. Continue aggressive medical therapy for coronary artery disease. Expect discharge tomorrow.  [name] MD, [facility]  02/04/2014, 2:04 PM   Assessment/Plan  Principal Problem:   NSTEMI, initial episode of care  Peak troponin 0.41.  SP LHC revealing severe single vessel disease.  Chronically occluded RCA. with faint  left-to-right collaterals.  It is small to medium in size and codominant. Aneurysmal left circumflex artery with sluggish  coronary flow.  Normal LV systolic function with moderately elevated left ventricular end-diastolic pressure.  Dual  antiplatelet recommended.  ASA, Plavix. Lopressor.  DC home today.  Recommend checking a P2Y12 at next  office visit.   Active Problems:   HTN (hypertension)  BP stable and controlled -lopressor   Dyslipidemia  Continue statin.  Discussed diet and exercise.  Triglycerides are below 150 but I would target a TG:HDL of 1.0 or  less.   Intermediate coronary syndrome      LOS: 2 days    HAGER, BRYAN PA-C 02/05/2014 7:50 AM  Patient seen, examined. Available data reviewed. Agree with findings, assessment, and plan as outlined by Tarri Fuller, PA-C. Exam reveals alert, oriented male in NAD. Lungs clear, heart RRR without murmur, abdomen soft, obese, ext without edema. Cath findings reviewed. Medications reviewed. Would discharge home today on current medical therapy. Follow-up with Dr Percival Spanish as outpatient.   Sherren Mocha, M.D. 02/05/2014 10:06 AM

## 2014-02-06 ENCOUNTER — Telehealth: Payer: Self-pay | Admitting: Cardiology

## 2014-02-06 NOTE — Telephone Encounter (Signed)
New message    Need a note return to work.    Fax # [phone]  Attention Davy Pique

## 2014-02-06 NOTE — Telephone Encounter (Signed)
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.  He does not do any manual labor.  States he was told to exercise and has been during so which is more strenuous than working.  He does not wish to remain out of work until his follow up appt.  Aware I will discuss with Dr Percival Spanish and call him back.

## 2014-02-06 NOTE — Telephone Encounter (Signed)
Reviewed with Dr Percival Spanish who states patient may return to work 02/07/2014 without restrictions.  Pt is aware and note will be faxed as requested.

## 2014-02-10 ENCOUNTER — Encounter: Payer: Self-pay | Admitting: *Deleted

## 2014-02-17 DIAGNOSIS — E119 Type 2 diabetes mellitus without complications: Secondary | ICD-10-CM | POA: Insufficient documentation

## 2014-02-17 DIAGNOSIS — E1159 Type 2 diabetes mellitus with other circulatory complications: Secondary | ICD-10-CM | POA: Insufficient documentation

## 2014-02-20 ENCOUNTER — Encounter: Payer: Medicare Other | Admitting: Physician Assistant

## 2014-02-24 ENCOUNTER — Encounter: Payer: Medicare Other | Admitting: Physician Assistant

## 2014-03-09 ENCOUNTER — Encounter: Payer: Medicare Other | Admitting: Physician Assistant

## 2014-03-09 ENCOUNTER — Encounter (INDEPENDENT_AMBULATORY_CARE_PROVIDER_SITE_OTHER): Payer: Self-pay

## 2014-03-09 ENCOUNTER — Encounter: Payer: Self-pay | Admitting: Physician Assistant

## 2014-03-09 ENCOUNTER — Ambulatory Visit (INDEPENDENT_AMBULATORY_CARE_PROVIDER_SITE_OTHER): Payer: Medicare Other | Admitting: Physician Assistant

## 2014-03-09 VITALS — BP 110/71 | HR 50 | Ht 71.0 in | Wt 225.0 lb

## 2014-03-09 DIAGNOSIS — I10 Essential (primary) hypertension: Secondary | ICD-10-CM

## 2014-03-09 DIAGNOSIS — I252 Old myocardial infarction: Secondary | ICD-10-CM

## 2014-03-09 DIAGNOSIS — I25119 Atherosclerotic heart disease of native coronary artery with unspecified angina pectoris: Secondary | ICD-10-CM | POA: Insufficient documentation

## 2014-03-09 DIAGNOSIS — I2510 Atherosclerotic heart disease of native coronary artery without angina pectoris: Secondary | ICD-10-CM

## 2014-03-09 DIAGNOSIS — E785 Hyperlipidemia, unspecified: Secondary | ICD-10-CM

## 2014-03-09 NOTE — Patient Instructions (Signed)
NO CHANGES WERE MADE TODAY WITH MEDICATIONS  You have been referred to [place] physician recommends that you return for lab work in: [place] [facility];IPID AND LIVER PANEL  Your physician recommends that you schedule a follow-up appointment in: [place] DR. HOCHREIN

## 2014-03-09 NOTE — Progress Notes (Signed)
[place], [address] [place], [place]  [id number] Phone: [phone] Fax:  [phone]  Date:  03/09/2014   ID:  Adam Harris, DOB 1946/04/25, MRN [id number]  PCP:  No primary provider on file.  Cardiologist:  Dr. Minus Breeding     History of Present Illness: Adam Harris is a 68 y.o. male with a history of HTN. He was admitted 3/24-3/26 with a non-STEMI. Cardiac catheterization demonstrated chronically occluded RCA and an aneurysmal circumflex with sluggish flow. Lifelong dual antiplatelet therapy was recommended. 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  He did have an elevated creatinine.  This was improved at d/c.    He returns with his wife. He is doing well. He denies chest pain, dyspnea, orthopnea, PND or edema. He denies syncope or near-syncope.  Studies:  - LHC (02/04/14):  Proximal LAD 30%, mid LAD 20%, proximal D1 20%, circumflex aneurysmal with sluggish flow in the middle segment and prox 20%, mid circumflex 50%, ostial OM2 20%, proximal OM3 20%, proximal AV circumflex 50%, proximal RCA occluded with left to right collaterals from the LAD, EF 50-50%.   Recent Labs: 02/04/2014: HDL Cholesterol 34*; Hemoglobin 13.8; LDL (calc) 96; TSH 3.527  02/05/2014: Creatinine 1.35; Potassium 4.1   Wt Readings from Last 3 Encounters:  03/09/14 225 lb (102.059 kg)  02/03/14 240 lb 14.4 oz (109.272 kg)  02/03/14 240 lb 14.4 oz (109.272 kg)     Past Medical History  Diagnosis Date  . Hypertension   . Arthritis     Current Outpatient Prescriptions  Medication Sig Dispense Refill  . aspirin EC 81 MG EC tablet Take 1 tablet (81 mg total) by mouth daily.      Marland Kitchen atorvastatin (LIPITOR) 20 MG tablet Take 1 tablet (20 mg total) by mouth daily at 6 PM.  30 tablet  5  . clopidogrel (PLAVIX) 75 MG tablet Take 1 tablet (75 mg total) by mouth daily with breakfast.  30 tablet  11  . diphenhydramine-acetaminophen (TYLENOL PM) 25-500 MG TABS Take 1 tablet by mouth at bedtime. Take  every night for sleep per patient      . meloxicam (MOBIC) 7.5 MG tablet Take 7.5 mg by mouth daily.       . metoprolol tartrate (LOPRESSOR) 25 MG tablet Take 1 tablet (25 mg total) by mouth 2 (two) times daily.  60 tablet  5  . nitroGLYCERIN (NITROSTAT) 0.4 MG SL tablet Place 1 tablet (0.4 mg total) under the tongue every 5 (five) minutes x 3 doses as needed for chest pain.  25 tablet  11  . Olmesartan-Amlodipine-HCTZ (TRIBENZOR) 40-10-25 MG TABS Take by mouth.      Marland Kitchen ibuprofen (ADVIL,MOTRIN) 200 MG tablet Take 1,200 mg by mouth every 6 (six) hours as needed. Take 6 tablets every day per patient for knee pain      . metFORMIN (GLUCOPHAGE) 500 MG tablet Take 500 mg by mouth daily with breakfast.        No current facility-administered medications for this visit.    Allergies:   Review of patient's allergies indicates no known allergies.   Social History:  The patient  reports that he has never smoked. He does not have any smokeless tobacco history on file. He reports that he does not drink alcohol.   Family History:  The patient's family history includes Hypertension in his mother.   ROS:  Please see the history of present illness.      All other systems  reviewed and negative.   PHYSICAL EXAM: VS:  BP 110/71  Pulse 50  Ht 5\' 11"  (1.803 m)  Wt 225 lb (102.059 kg)  BMI 31.39 kg/m2 Well nourished, well developed, in no acute distress HEENT: normal Neck: no JVD Cardiac:  normal S1, S2; RRR; no murmur Lungs:  clear to auscultation bilaterally, no wheezing, rhonchi or rales Abd: soft, nontender, no hepatomegaly Ext: no edemaright wrist without hematoma or mass  Skin: warm and dry Neuro:  CNs 2-12 intact, no focal abnormalities noted  EKG:  Sinus bradycardia, HR 50, normal axis, T-wave inversions in 1, aVL, V5-6     ASSESSMENT AND PLAN:  1. History of non-ST elevation myocardial infarction (NSTEMI): Continue aspirin, Plavix, statin, beta blocker. Refer to cardiac  rehabilitation. 2. Coronary Artery Disease: As noted his chronically occluded RCA is to be treated medically. He will need to remain on lifelong dual antiplatelet therapy in light of his aneurysmal circumflex with sluggish flow and chronically occluded RCA. Continue aspirin, Plavix, statin, beta blocker. 3. HTN (hypertension): Controlled. 4. Dyslipidemia: Continue statin. Check lipids and LFTs in 6 weeks. 5. Disposition: Follow up with Dr. Percival Spanish in 6 weeks.  Signed, Richardson Dopp, PA-C  03/09/2014 2:28 PM

## 2014-04-16 ENCOUNTER — Inpatient Hospital Stay (HOSPITAL_COMMUNITY): Admission: RE | Admit: 2014-04-16 | Payer: Medicare Other | Source: Ambulatory Visit

## 2014-04-20 ENCOUNTER — Encounter: Payer: Self-pay | Admitting: Cardiology

## 2014-04-20 ENCOUNTER — Ambulatory Visit (INDEPENDENT_AMBULATORY_CARE_PROVIDER_SITE_OTHER): Payer: Medicare Other | Admitting: Cardiology

## 2014-04-20 ENCOUNTER — Other Ambulatory Visit: Payer: Medicare Other

## 2014-04-20 ENCOUNTER — Ambulatory Visit (HOSPITAL_COMMUNITY): Payer: Medicare Other

## 2014-04-20 VITALS — BP 122/74 | HR 67 | Ht 71.0 in | Wt 223.0 lb

## 2014-04-20 DIAGNOSIS — E785 Hyperlipidemia, unspecified: Secondary | ICD-10-CM

## 2014-04-20 DIAGNOSIS — I10 Essential (primary) hypertension: Secondary | ICD-10-CM

## 2014-04-20 DIAGNOSIS — I2510 Atherosclerotic heart disease of native coronary artery without angina pectoris: Secondary | ICD-10-CM

## 2014-04-20 NOTE — Progress Notes (Signed)
HPI The patient presents for follow up of CAD with a hospitalizatoin 3/24-3/26 with a non-STEMI. Cardiac catheterization demonstrated chronically occluded RCA and an aneurysmal circumflex with sluggish flow. Lifelong dual antiplatelet therapy was recommended. 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 He did have an elevated creatinine. This was improved at discharge.  Since I last saw him he has done well.  The patient denies any new symptoms such as chest discomfort, neck or arm discomfort. There has been no new shortness of breath, PND or orthopnea. There have been no reported palpitations, presyncope or syncope.   No Known Allergies  Current Outpatient Prescriptions  Medication Sig Dispense Refill  . aspirin EC 81 MG EC tablet Take 1 tablet (81 mg total) by mouth daily.      Marland Kitchen atorvastatin (LIPITOR) 20 MG tablet Take 1 tablet (20 mg total) by mouth daily at 6 PM.  30 tablet  5  . clopidogrel (PLAVIX) 75 MG tablet Take 1 tablet (75 mg total) by mouth daily with breakfast.  30 tablet  11  . diphenhydramine-acetaminophen (TYLENOL PM) 25-500 MG TABS Take 1 tablet by mouth at bedtime. Take every night for sleep per patient      . ibuprofen (ADVIL,MOTRIN) 200 MG tablet Take 1,200 mg by mouth every 6 (six) hours as needed. Take 6 tablets every day per patient for knee pain      . meloxicam (MOBIC) 7.5 MG tablet Take 7.5 mg by mouth daily.       . metFORMIN (GLUCOPHAGE) 500 MG tablet Take 500 mg by mouth daily with breakfast.       . metoprolol tartrate (LOPRESSOR) 25 MG tablet Take 1 tablet (25 mg total) by mouth 2 (two) times daily.  60 tablet  5  . nitroGLYCERIN (NITROSTAT) 0.4 MG SL tablet Place 1 tablet (0.4 mg total) under the tongue every 5 (five) minutes x 3 doses as needed for chest pain.  25 tablet  11  . Olmesartan-Amlodipine-HCTZ (TRIBENZOR) 40-10-25 MG TABS Take by mouth.       No current facility-administered medications for this visit.    Past Medical History    Diagnosis Date  . Hypertension   . Arthritis     Past Surgical History  Procedure Laterality Date  . Knee arthroscopy      ROS:  As stated in the HPI and negative for all other systems.  PHYSICAL EXAM BP 122/74  Pulse 67  Ht 5\' 11"  (1.803 m)  Wt 223 lb (101.152 kg)  BMI 31.12 kg/m2  SpO2 97% GENERAL:  Well appearing NECK:  No jugular venous distention, waveform within normal limits, carotid upstroke brisk and symmetric, no bruits, no thyromegaly LUNGS:  Clear to auscultation bilaterally BACK:  No CVA tenderness CHEST:  Unremarkable HEART:  PMI not displaced or sustained,S1 and S2 within normal limits, no S3, no S4, no clicks, no rubs, no murmurs ABD:  Flat, positive bowel sounds normal in frequency in pitch, no bruits, no rebound, no guarding, no midline pulsatile mass, no hepatomegaly, no splenomegaly EXT:  2 plus pulses throughout, no edema, no cyanosis no clubbing   ASSESSMENT AND PLAN  CAD:  The patient has no new sypmtoms.  No further cardiovascular testing is indicated.  We will continue with aggressive risk reduction and meds as listed.  DYSLIPIDEMIA:  He was to have his lipids drawn.  However, he ate this morning.  He will come back for lipid profile and liver enzymes.  HTN:  The blood pressure is at target. No change in medications is indicated. We will continue with therapeutic lifestyle changes (TLC).

## 2014-04-20 NOTE — Patient Instructions (Signed)
Your physician recommends that you return for fasting lab work tomorrow at the [facility] across from [place] recommends that you continue on your current medications as directed. Please refer to the Current Medication list given to you today.  Your physician wants you to follow-up in: 6 months with Dr. Percival Spanish at the [facility] office in [place] above the [place]. You will receive a reminder letter in the mail two months in advance. If you don't receive a letter, please call our office to schedule the follow-up appointment.

## 2014-04-21 ENCOUNTER — Ambulatory Visit (HOSPITAL_COMMUNITY): Payer: Medicare Other

## 2014-04-21 ENCOUNTER — Other Ambulatory Visit: Payer: Medicare Other

## 2014-04-21 ENCOUNTER — Other Ambulatory Visit (INDEPENDENT_AMBULATORY_CARE_PROVIDER_SITE_OTHER): Payer: Medicare Other

## 2014-04-21 DIAGNOSIS — E785 Hyperlipidemia, unspecified: Secondary | ICD-10-CM

## 2014-04-21 LAB — HEPATIC FUNCTION PANEL
ALT: 14 U/L (ref 0–53)
AST: 18 U/L (ref 0–37)
Albumin: 4.3 g/dL (ref 3.5–5.2)
Alkaline Phosphatase: 60 U/L (ref 39–117)
Bilirubin, Direct: 0.2 mg/dL (ref 0.0–0.3)
TOTAL PROTEIN: 7.6 g/dL (ref 6.0–8.3)
Total Bilirubin: 1.5 mg/dL — ABNORMAL HIGH (ref 0.2–1.2)

## 2014-04-21 LAB — LIPID PANEL
CHOLESTEROL: 130 mg/dL (ref 0–200)
HDL: 40.5 mg/dL (ref >39.00)
LDL Cholesterol: 81 mg/dL (ref 0–99)
NonHDL: 89.5
TRIGLYCERIDES: 45 mg/dL (ref 0.0–149.0)
Total CHOL/HDL Ratio: 3
VLDL: 9 mg/dL (ref 0.0–40.0)

## 2014-04-22 ENCOUNTER — Ambulatory Visit (HOSPITAL_COMMUNITY): Payer: Medicare Other

## 2014-04-24 ENCOUNTER — Ambulatory Visit (HOSPITAL_COMMUNITY): Payer: Medicare Other

## 2014-04-27 ENCOUNTER — Ambulatory Visit (HOSPITAL_COMMUNITY): Payer: Medicare Other

## 2014-04-29 ENCOUNTER — Ambulatory Visit (HOSPITAL_COMMUNITY): Payer: Medicare Other

## 2014-05-01 ENCOUNTER — Ambulatory Visit (HOSPITAL_COMMUNITY): Payer: Medicare Other

## 2014-05-04 ENCOUNTER — Ambulatory Visit (HOSPITAL_COMMUNITY): Payer: Medicare Other

## 2014-05-06 ENCOUNTER — Ambulatory Visit (HOSPITAL_COMMUNITY): Payer: Medicare Other

## 2014-05-08 ENCOUNTER — Ambulatory Visit (HOSPITAL_COMMUNITY): Payer: Medicare Other

## 2014-05-11 ENCOUNTER — Ambulatory Visit (HOSPITAL_COMMUNITY): Payer: Medicare Other

## 2014-05-13 ENCOUNTER — Ambulatory Visit (HOSPITAL_COMMUNITY): Payer: Medicare Other

## 2014-05-18 ENCOUNTER — Ambulatory Visit (HOSPITAL_COMMUNITY): Payer: Medicare Other

## 2014-05-20 ENCOUNTER — Ambulatory Visit (HOSPITAL_COMMUNITY): Payer: Medicare Other

## 2014-05-22 ENCOUNTER — Ambulatory Visit (HOSPITAL_COMMUNITY): Payer: Medicare Other

## 2014-05-25 ENCOUNTER — Ambulatory Visit (HOSPITAL_COMMUNITY): Payer: Medicare Other

## 2014-05-27 ENCOUNTER — Ambulatory Visit (HOSPITAL_COMMUNITY): Payer: Medicare Other

## 2014-05-29 ENCOUNTER — Ambulatory Visit (HOSPITAL_COMMUNITY): Payer: Medicare Other

## 2014-06-01 ENCOUNTER — Ambulatory Visit (HOSPITAL_COMMUNITY): Payer: Medicare Other

## 2014-06-03 ENCOUNTER — Ambulatory Visit (HOSPITAL_COMMUNITY): Payer: Medicare Other

## 2014-06-05 ENCOUNTER — Ambulatory Visit (HOSPITAL_COMMUNITY): Payer: Medicare Other

## 2014-06-08 ENCOUNTER — Ambulatory Visit (HOSPITAL_COMMUNITY): Payer: Medicare Other

## 2014-06-10 ENCOUNTER — Ambulatory Visit (HOSPITAL_COMMUNITY): Payer: Medicare Other

## 2014-06-12 ENCOUNTER — Ambulatory Visit (HOSPITAL_COMMUNITY): Payer: Medicare Other

## 2014-06-15 ENCOUNTER — Ambulatory Visit (HOSPITAL_COMMUNITY): Payer: Medicare Other

## 2014-06-17 ENCOUNTER — Ambulatory Visit (HOSPITAL_COMMUNITY): Payer: Medicare Other

## 2014-06-19 ENCOUNTER — Ambulatory Visit (HOSPITAL_COMMUNITY): Payer: Medicare Other

## 2014-06-22 ENCOUNTER — Ambulatory Visit (HOSPITAL_COMMUNITY): Payer: Medicare Other

## 2014-06-24 ENCOUNTER — Ambulatory Visit (HOSPITAL_COMMUNITY): Payer: Medicare Other

## 2014-06-26 ENCOUNTER — Ambulatory Visit (HOSPITAL_COMMUNITY): Payer: Medicare Other

## 2014-06-29 ENCOUNTER — Ambulatory Visit (HOSPITAL_COMMUNITY): Payer: Medicare Other

## 2014-07-01 ENCOUNTER — Ambulatory Visit (HOSPITAL_COMMUNITY): Payer: Medicare Other

## 2014-07-03 ENCOUNTER — Ambulatory Visit (HOSPITAL_COMMUNITY): Payer: Medicare Other

## 2014-07-06 ENCOUNTER — Ambulatory Visit (HOSPITAL_COMMUNITY): Payer: Medicare Other

## 2014-07-08 ENCOUNTER — Ambulatory Visit (HOSPITAL_COMMUNITY): Payer: Medicare Other

## 2014-07-10 ENCOUNTER — Ambulatory Visit (HOSPITAL_COMMUNITY): Payer: Medicare Other

## 2014-07-13 ENCOUNTER — Ambulatory Visit (HOSPITAL_COMMUNITY): Payer: Medicare Other

## 2014-07-15 ENCOUNTER — Ambulatory Visit (HOSPITAL_COMMUNITY): Payer: Medicare Other

## 2014-07-17 ENCOUNTER — Ambulatory Visit (HOSPITAL_COMMUNITY): Payer: Medicare Other

## 2014-07-22 ENCOUNTER — Ambulatory Visit (HOSPITAL_COMMUNITY): Payer: Medicare Other

## 2014-07-24 ENCOUNTER — Ambulatory Visit (HOSPITAL_COMMUNITY): Payer: Medicare Other

## 2014-07-24 DIAGNOSIS — Z955 Presence of coronary angioplasty implant and graft: Secondary | ICD-10-CM | POA: Insufficient documentation

## 2014-07-27 ENCOUNTER — Encounter: Payer: Self-pay | Admitting: Internal Medicine

## 2014-07-27 ENCOUNTER — Ambulatory Visit (INDEPENDENT_AMBULATORY_CARE_PROVIDER_SITE_OTHER): Payer: Medicare Other | Admitting: Internal Medicine

## 2014-07-27 ENCOUNTER — Other Ambulatory Visit (INDEPENDENT_AMBULATORY_CARE_PROVIDER_SITE_OTHER): Payer: Medicare Other

## 2014-07-27 VITALS — BP 122/68 | HR 68 | Temp 98.3°F | Resp 16 | Ht 71.0 in | Wt 231.1 lb

## 2014-07-27 DIAGNOSIS — R972 Elevated prostate specific antigen [PSA]: Secondary | ICD-10-CM | POA: Diagnosis not present

## 2014-07-27 DIAGNOSIS — E119 Type 2 diabetes mellitus without complications: Secondary | ICD-10-CM

## 2014-07-27 DIAGNOSIS — I2510 Atherosclerotic heart disease of native coronary artery without angina pectoris: Secondary | ICD-10-CM

## 2014-07-27 DIAGNOSIS — I2583 Coronary atherosclerosis due to lipid rich plaque: Principal | ICD-10-CM

## 2014-07-27 DIAGNOSIS — E785 Hyperlipidemia, unspecified: Secondary | ICD-10-CM

## 2014-07-27 DIAGNOSIS — I10 Essential (primary) hypertension: Secondary | ICD-10-CM

## 2014-07-27 DIAGNOSIS — Z23 Encounter for immunization: Secondary | ICD-10-CM

## 2014-07-27 DIAGNOSIS — E1122 Type 2 diabetes mellitus with diabetic chronic kidney disease: Secondary | ICD-10-CM

## 2014-07-27 LAB — BASIC METABOLIC PANEL
BUN: 28 mg/dL — AB (ref 6–23)
CALCIUM: 9.4 mg/dL (ref 8.4–10.5)
CO2: 27 mEq/L (ref 19–32)
Chloride: 103 mEq/L (ref 96–112)
Creatinine, Ser: 1.6 mg/dL — ABNORMAL HIGH (ref 0.4–1.5)
GFR: 45.96 mL/min — AB (ref >60.00)
GLUCOSE: 157 mg/dL — AB (ref 70–99)
Potassium: 4.1 mEq/L (ref 3.5–5.1)
Sodium: 139 mEq/L (ref 135–145)

## 2014-07-27 LAB — HEMOGLOBIN A1C: Hgb A1c MFr Bld: 7.1 % — ABNORMAL HIGH (ref 4.6–6.5)

## 2014-07-27 MED ORDER — PNEUMOCOCCAL VAC POLYVALENT 25 MCG/0.5ML IJ INJ: 0.5000 mL | INJECTION | INTRAMUSCULAR | Status: DC

## 2014-07-27 MED ORDER — DICLOFENAC SODIUM 1 % TD GEL: 2.0000 g | Freq: Three times a day (TID) | TRANSDERMAL | Status: DC | PRN

## 2014-07-27 NOTE — Assessment & Plan Note (Signed)
Per patient he had abnormal PSA and then biopsy with low grade changes and follows with urology every 6 months to monitor PSA. No new symptoms.

## 2014-07-27 NOTE — Assessment & Plan Note (Signed)
BP initially elevated but reduced with sitting for about 10 minutes. He did not take his tribenzor this morning because he was coming in but usually take it. He is also on metorolol 25 mg bid. HR 68 today. Check BMP today. Filed Vitals:   07/27/14 1023 07/27/14 1046  BP: 168/90 122/68  Pulse: 68   Temp: 98.3 F (36.8 C)   TempSrc: Oral   Resp: 16   Height: 5\' 11"  (1.803 m)   Weight: 231 lb 1.9 oz (104.835 kg)   SpO2: 96%

## 2014-07-27 NOTE — Patient Instructions (Signed)
We will check on your blood work today. We will give you a cream medicine for pain that you can use on your heel and your knee. It is called voltaren gel and you can use it up to 3 times per day.  Work on increasing your exercise to help keep your heart healthy.   Come back in about 3-6 months to check on your blood pressure and your sugars.   We will give you the flu shot and pneumonia shot today.   Use these stretches to help your ankle and heel to recover.  Achilles Tendinitis  with Rehab Achilles tendinitis is a disorder of the Achilles tendon. The Achilles tendon connects the large calf muscles (Gastrocnemius and Soleus) to the heel bone (calcaneus). This tendon is sometimes called the heel cord. It is important for pushing-off and standing on your toes and is important for walking, running, or jumping. Tendinitis is often caused by overuse and repetitive microtrauma. SYMPTOMS  Pain, tenderness, swelling, warmth, and redness may occur over the Achilles tendon even at rest.  Pain with pushing off, or flexing or extending the ankle.  Pain that is worsened after or during activity. CAUSES   Overuse sometimes seen with rapid increase in exercise programs or in sports requiring running and jumping.  Poor physical conditioning (strength and flexibility or endurance).  Running sports, especially training running down hills.  Inadequate warm-up before practice or play or failure to stretch before participation.  Injury to the tendon. PREVENTION   Warm up and stretch before practice or competition.  Allow time for adequate rest and recovery between practices and competition.  Keep up conditioning.  Keep up ankle and leg flexibility.  Improve or keep muscle strength and endurance.  Improve cardiovascular fitness.  Use proper technique.  Use proper equipment (shoes, skates).  To help prevent recurrence, taping, protective strapping, or an adhesive bandage may be recommended  for several weeks after healing is complete. PROGNOSIS   Recovery may take weeks to several months to heal.  Longer recovery is expected if symptoms have been prolonged.  Recovery is usually quicker if the inflammation is due to a direct blow as compared with overuse or sudden strain. RELATED COMPLICATIONS   Healing time will be prolonged if the condition is not correctly treated. The injury must be given plenty of time to heal.  Symptoms can reoccur if activity is resumed too soon.  Untreated, tendinitis may increase the risk of tendon rupture requiring additional time for recovery and possibly surgery. TREATMENT   The first treatment consists of rest anti-inflammatory medication, and ice to relieve the pain.  Stretching and strengthening exercises after resolution of pain will likely help reduce the risk of recurrence. Referral to a physical therapist or athletic trainer for further evaluation and treatment may be helpful.  A walking boot or cast may be recommended to rest the Achilles tendon. This can help break the cycle of inflammation and microtrauma.  Arch supports (orthotics) may be prescribed or recommended by your caregiver as an adjunct to therapy and rest.  Surgery to remove the inflamed tendon lining or degenerated tendon tissue is rarely necessary and has shown less than predictable results. MEDICATION   Nonsteroidal anti-inflammatory medications, such as aspirin and ibuprofen, may be used for pain and inflammation relief. Do not take within 7 days before surgery. Take these as directed by your caregiver. Contact your caregiver immediately if any bleeding, stomach upset, or signs of allergic reaction occur. Other minor pain relievers, such  as acetaminophen, may also be used.  Pain relievers may be prescribed as necessary by your caregiver. Do not take prescription pain medication for longer than 4 to 7 days. Use only as directed and only as much as you need.  Cortisone  injections are rarely indicated. Cortisone injections may weaken tendons and predispose to rupture. It is better to give the condition more time to heal than to use them. HEAT AND COLD  Cold is used to relieve pain and reduce inflammation for acute and chronic Achilles tendinitis. Cold should be applied for 10 to 15 minutes every 2 to 3 hours for inflammation and pain and immediately after any activity that aggravates your symptoms. Use ice packs or an ice massage.  Heat may be used before performing stretching and strengthening activities prescribed by your caregiver. Use a heat pack or a warm soak. SEEK MEDICAL CARE IF:  Symptoms get worse or do not improve in 2 weeks despite treatment.  New, unexplained symptoms develop. Drugs used in treatment may produce side effects. EXERCISES RANGE OF MOTION (ROM) AND STRETCHING EXERCISES - Achilles Tendinitis  These exercises may help you when beginning to rehabilitate your injury. Your symptoms may resolve with or without further involvement from your physician, physical therapist or athletic trainer. While completing these exercises, remember:   Restoring tissue flexibility helps normal motion to return to the joints. This allows healthier, less painful movement and activity.  An effective stretch should be held for at least 30 seconds.  A stretch should never be painful. You should only feel a gentle lengthening or release in the stretched tissue. STRETCH - Gastroc, Standing   Place hands on wall.  Extend right / left leg, keeping the front knee somewhat bent.  Slightly point your toes inward on your back foot.  Keeping your right / left heel on the floor and your knee straight, shift your weight toward the wall, not allowing your back to arch.  You should feel a gentle stretch in the right / left calf. Hold this position for __________ seconds. Repeat __________ times. Complete this stretch __________ times per day. STRETCH - Soleus,  Standing   Place hands on wall.  Extend right / left leg, keeping the other knee somewhat bent.  Slightly point your toes inward on your back foot.  Keep your right / left heel on the floor, bend your back knee, and slightly shift your weight over the back leg so that you feel a gentle stretch deep in your back calf.  Hold this position for __________ seconds. Repeat __________ times. Complete this stretch __________ times per day. STRETCH - Gastrocsoleus, Standing  Note: This exercise can place a lot of stress on your foot and ankle. Please complete this exercise only if specifically instructed by your caregiver.   Place the ball of your right / left foot on a step, keeping your other foot firmly on the same step.  Hold on to the wall or a rail for balance.  Slowly lift your other foot, allowing your body weight to press your heel down over the edge of the step.  You should feel a stretch in your right / left calf.  Hold this position for __________ seconds.  Repeat this exercise with a slight bend in your knee. Repeat __________ times. Complete this stretch __________ times per day.  STRENGTHENING EXERCISES - Achilles Tendinitis These exercises may help you when beginning to rehabilitate your injury. They may resolve your symptoms with or without further involvement  from your physician, physical therapist or athletic trainer. While completing these exercises, remember:   Muscles can gain both the endurance and the strength needed for everyday activities through controlled exercises.  Complete these exercises as instructed by your physician, physical therapist or athletic trainer. Progress the resistance and repetitions only as guided.  You may experience muscle soreness or fatigue, but the pain or discomfort you are trying to eliminate should never worsen during these exercises. If this pain does worsen, stop and make certain you are following the directions exactly. If the pain  is still present after adjustments, discontinue the exercise until you can discuss the trouble with your clinician. STRENGTH - Plantar-flexors   Sit with your right / left leg extended. Holding onto both ends of a rubber exercise band/tubing, loop it around the ball of your foot. Keep a slight tension in the band.  Slowly push your toes away from you, pointing them downward.  Hold this position for __________ seconds. Return slowly, controlling the tension in the band/tubing. Repeat __________ times. Complete this exercise __________ times per day.  STRENGTH - Plantar-flexors   Stand with your feet shoulder width apart. Steady yourself with a wall or table using as little support as needed.  Keeping your weight evenly spread over the width of your feet, rise up on your toes.*  Hold this position for __________ seconds. Repeat __________ times. Complete this exercise __________ times per day.  *If this is too easy, shift your weight toward your right / left leg until you feel challenged. Ultimately, you may be asked to do this exercise with your right / left foot only. STRENGTH - Plantar-flexors, Eccentric  Note: This exercise can place a lot of stress on your foot and ankle. Please complete this exercise only if specifically instructed by your caregiver.   Place the balls of your feet on a step. With your hands, use only enough support from a wall or rail to keep your balance.  Keep your knees straight and rise up on your toes.  Slowly shift your weight entirely to your right / left toes and pick up your opposite foot. Gently and with controlled movement, lower your weight through your right / left foot so that your heel drops below the level of the step. You will feel a slight stretch in the back of your calf at the end position.  Use the healthy leg to help rise up onto the balls of both feet, then lower weight only on the right / left leg again. Build up to 15 repetitions. Then progress  to 3 consecutive sets of 15 repetitions.*  After completing the above exercise, complete the same exercise with a slight knee bend (about 30 degrees). Again, build up to 15 repetitions. Then progress to 3 consecutive sets of 15 repetitions.* Perform this exercise __________ times per day.  *When you easily complete 3 sets of 15, your physician, physical therapist or athletic trainer may advise you to add resistance by wearing a backpack filled with additional weight. STRENGTH - Plantar Flexors, Seated   Sit on a chair that allows your feet to rest flat on the ground. If necessary, sit at the edge of the chair.  Keeping your toes firmly on the ground, lift your right / left heel as far as you can without increasing any discomfort in your ankle. Repeat __________ times. Complete this exercise __________ times a day. *If instructed by your physician, physical therapist or athletic trainer, you may add ____________________ of  resistance by placing a weighted object on your right / left knee. Document Released: 05/31/2005 Document Revised: 01/22/2012 Document Reviewed: 02/11/2009 [facility] Patient Information 2015 [place], [place]. This information is not intended to replace advice given to you by your health care provider. Make sure you discuss any questions you have with your health care provider.

## 2014-07-27 NOTE — Progress Notes (Signed)
   Subjective:    Patient ID: Adam Harris, male    DOB: September 13, 1946, 68 y.o.   MRN: [id number]  HPI The patient is a 68 YO man who is coming in today to establish care. He has PMH of DM type 2, CAD with recent NSTEMI with stenting earlier this year, HTN, dyslipidemia. He is having some heel pain and thinks that he was told he strained his achilles tendon and so he is walking a little different and having some pain in the contralateral knee. He denies chest pain with activity, SOB, abdominal pain, nausea, diarrhea, constipation. He is in today with his wife and she helps to provide some of the history.   Review of Systems  Constitutional: Negative for fever, chills, activity change, appetite change and fatigue.  HENT: Negative.   Respiratory: Negative for cough, chest tightness, shortness of breath and wheezing.   Cardiovascular: Negative for chest pain, palpitations and leg swelling.  Gastrointestinal: Negative for abdominal pain, diarrhea, constipation and abdominal distention.  Musculoskeletal: Positive for myalgias. Negative for back pain, gait problem and joint swelling.  Neurological: Negative for dizziness, weakness, numbness and headaches.      Objective:   Physical Exam  Vitals reviewed. Constitutional: He is oriented to person, place, and time. He appears well-developed and well-nourished.  Overweight  HENT:  Head: Normocephalic and atraumatic.  Eyes: EOM are normal.  Neck: Normal range of motion.  Cardiovascular: Normal rate and regular rhythm.   No murmur heard. Pulmonary/Chest: Effort normal and breath sounds normal. No respiratory distress. He has no wheezes. He has no rales.  Abdominal: Soft. Bowel sounds are normal. He exhibits no distension. There is no tenderness. There is no rebound.  Neurological: He is alert and oriented to person, place, and time.  Skin: Skin is warm and dry.   Filed Vitals:   07/27/14 1023 07/27/14 1046  BP: 168/90 122/68  Pulse: 68   Temp: 98.3  F (36.8 C)   TempSrc: Oral   Resp: 16   Height: 5\' 11"  (1.803 m)   Weight: 231 lb 1.9 oz (104.835 kg)   SpO2: 96%        Assessment & Plan:

## 2014-07-27 NOTE — Assessment & Plan Note (Signed)
Continue lipitor  ?

## 2014-07-27 NOTE — Progress Notes (Signed)
Pre visit review using our clinic review tool, if applicable. No additional management support is needed unless otherwise documented below in the visit note. 

## 2014-07-27 NOTE — Assessment & Plan Note (Signed)
Will check HgA1c today but he is taking metformin 500 daily at this time and room to increase. He is on ARB, lipitor.

## 2014-07-27 NOTE — Assessment & Plan Note (Signed)
Patient currently without anginal symptoms. He is on lifelong DAPT with ASA and plavix. He is on statin and BP good today. Has not needed his nitro recently.

## 2014-07-28 MED ORDER — METFORMIN HCL 500 MG PO TABS: 500.0000 mg | ORAL_TABLET | Freq: Two times a day (BID) | ORAL | Status: DC

## 2014-07-28 NOTE — Addendum Note (Signed)
Addended by: Vertell Novak A on: 07/28/2014 03:32 PM   Modules accepted: Orders

## 2014-07-30 ENCOUNTER — Telehealth: Payer: Self-pay | Admitting: Internal Medicine

## 2014-07-30 NOTE — Telephone Encounter (Signed)
Patient states pharmacy is sending a PA over for voltaren

## 2014-08-03 ENCOUNTER — Ambulatory Visit: Payer: Medicare Other | Admitting: Family Medicine

## 2014-08-10 ENCOUNTER — Telehealth: Payer: Self-pay | Admitting: Family Medicine

## 2014-08-10 NOTE — Telephone Encounter (Signed)
error 

## 2014-08-13 ENCOUNTER — Encounter: Payer: Self-pay | Admitting: Family Medicine

## 2014-08-13 ENCOUNTER — Other Ambulatory Visit (INDEPENDENT_AMBULATORY_CARE_PROVIDER_SITE_OTHER): Payer: Medicare Other

## 2014-08-13 ENCOUNTER — Ambulatory Visit (INDEPENDENT_AMBULATORY_CARE_PROVIDER_SITE_OTHER): Payer: Medicare Other | Admitting: Family Medicine

## 2014-08-13 VITALS — BP 136/72 | HR 62 | Ht 71.0 in | Wt 231.0 lb

## 2014-08-13 DIAGNOSIS — M79672 Pain in left foot: Secondary | ICD-10-CM

## 2014-08-13 DIAGNOSIS — M6788 Other specified disorders of synovium and tendon, other site: Secondary | ICD-10-CM | POA: Insufficient documentation

## 2014-08-13 DIAGNOSIS — M7662 Achilles tendinitis, left leg: Secondary | ICD-10-CM

## 2014-08-13 NOTE — Assessment & Plan Note (Signed)
Patient does have what appears to be calcific Achilles tendinosis. This chronic problem and will take some time to improve. Patient is going to do an icing regimen, home exercise program and was given a pneumatic compression sleeve that I think will help stabilize the Achilles. We discussed the importance of weight loss and staying active. We discussed shoe choices that could be beneficial as well. Patient will try these interventions and come back and see me again in 3 weeks for further evaluation and treatment.

## 2014-08-13 NOTE — Patient Instructions (Signed)
Good to see you.  Ice 20 minutes 2 times daily. Usually after activity and before bed. Exercises 3 times a week.  Try the brace with activity.  Try the pennsaid up to 2 times daily when you are having pain.  A pinkie worth. Come back again in 3 weeks and we will see if that knee is better.

## 2014-08-13 NOTE — Progress Notes (Signed)
[facility] [address], [place] [address] Phone: [phone] Subjective:    I'm seeing this patient by the request  of:  Olga Millers, MD   CC: Left ankle pain  [id number] Adam Harris is a 68 y.o. male coming in with complaint of left ankle pain. Patient states that this has been hurting for multiple months the symptoms have gotten worse in the last 2 months. Patient states that he stands for more than 3-4 hours he has pain on the posterior aspect near his heel. Patient states that sometimes it seems to be swelling. Depends on what shoes he wears. Patient has had some benefit when he actually wears a heel lift. Patient has not tried any other home remedies at this time. Sometimes takes ibuprofen even though he is not supposed to with his heart medications he states. Patient denies that this is stopping him from any activities. Denies any radiation of pain or any numbness. Denies any weakness in his legs.    Past medical history, social, surgical and family history all reviewed in electronic medical record.   Review of Systems: No headache, visual changes, nausea, vomiting, diarrhea, constipation, dizziness, abdominal pain, skin rash, fevers, chills, night sweats, weight loss, swollen lymph nodes, body aches, joint swelling, muscle aches, chest pain, shortness of breath, mood changes.   Objective Blood pressure 136/72, pulse 62, height 5\' 11"  (1.803 m), weight 231 lb (104.781 kg), SpO2 97.00%.  General: No apparent distress alert and oriented x3 mood and affect normal, dressed appropriately.  HEENT: Pupils equal, extraocular movements intact  Respiratory: Patient's speak in full sentences and does not appear short of breath  Cardiovascular: No lower extremity edema, non tender, no erythema  Skin: Warm dry intact with no signs of infection or rash on extremities or on axial skeleton.  Abdomen: Soft nontender  Neuro: Cranial nerves II through  XII are intact, neurovascularly intact in all extremities with 2+ DTRs and 2+ pulses.  Lymph: No lymphadenopathy of posterior or anterior cervical chain or axillae bilaterally.  Gait normal with good balance and coordination.  MSK:  Non tender with full range of motion and good stability and symmetric strength and tone of shoulders, elbows, wrist, hip, knee and  bilaterally. Osteophytic changes in multiple joints. Ankle: Haglund's nodule on the posterior aspect of the heel. Patient also has some mild pes planus Range of motion is full in all directions. Strength is 5/5 in all directions. Stable lateral and medial ligaments; squeeze test and kleiger test unremarkable; Talar dome nontender; No pain at base of 5th MT; No tenderness over cuboid; No tenderness over N spot or navicular prominence No tenderness on posterior aspects of lateral and medial malleolus No sign of peroneal tendon subluxations or tenderness to palpation Negative tarsal tunnel tinel's Able to walk 4 steps. Contralateral ankle is unremarkable with pes planus noted as well.  MSK US performed of: Left ankle This study was ordered, performed, and interpreted by Charlann Boxer D.O.  Foot/Ankle:   All structures visualized.   Talar dome unremarkable  Ankle mortise without effusion. Peroneus longus and brevis tendons unremarkable on long and transverse views without sheath effusions. Posterior tibialis, flexor hallucis longus, and flexor digitorum longus tendons unremarkable on long and transverse views without sheath effusions. Achilles tendon visualized with significant hypoechoic changes at its insertion as well as calcific changes. No true tear appreciated.  Anterior Talofibular Ligament and Calcaneofibular Ligaments unremarkable and intact. Deltoid Ligament unremarkable and intact. Plantar  fascia intact and without effusion, normal thickness. No increased doppler signal, cap sign, or thickening of tibial cortex. Power  doppler signal normal.  IMPRESSION:  Chronic calcific Achilles tendinosis       Impression and Recommendations:     This case required medical decision making of moderate complexity.

## 2014-08-14 ENCOUNTER — Telehealth: Payer: Self-pay | Admitting: Family Medicine

## 2014-08-14 NOTE — Telephone Encounter (Signed)
Spoke to pt's wife, dr. Tamala Julian advised him to use the topical voltaren gel, apply, ice, & can take OTC tylenol 650mg  TID. Pt's wife stated that he has done all of this but has not helped. She asked if Dr. Tamala Julian would prescribe Hydrocodone because she's worried that her husband is in so much pain & it may effect her heart, I told her that if she's worried about the pt's heart then prescribing him Hydrocodone would not be a good idea. She insisted again that the pt needs it to help with the pain, I advised her that Dr. Tamala Julian does not prescribe those meds & she then hung up the phone.

## 2014-08-14 NOTE — Telephone Encounter (Signed)
Wife is requesting something for pain for her husband.  He has a pain med but he can not take b/c of blood thinner that he is on.

## 2014-09-25 ENCOUNTER — Telehealth: Payer: Self-pay | Admitting: Cardiology

## 2014-09-25 NOTE — Telephone Encounter (Addendum)
New message     Pt is having teeth pulled on Monday.  Need clearance to have this done---will call back today with the fax number 2:12pm please fax note to [phone]

## 2014-09-25 NOTE — Telephone Encounter (Signed)
Will defer to Dr Debara Pickett (D.O.D),  Dr Percival Spanish is not avaiable

## 2014-09-25 NOTE — Telephone Encounter (Signed)
Would hold ASA for 7 days and plavix for 5 days prior to dental work, resume after.  Dr. Debara Pickett (for Dr. Percival Spanish)

## 2014-09-25 NOTE — Telephone Encounter (Signed)
Returned call to patient no answer on home phone,no answer on cell phone.Left message on both phones Dr.Hilty advised hold aspirin for 7 days and hold plavix for 5 days prior to dental work,then resume after.Note faxed to fax # [phone].

## 2014-09-25 NOTE — Telephone Encounter (Signed)
Pt is calling back to see if the paper will be faxed to him today in regards to medications he can and can not take before he has a tooth pulled on 11/16. Please call  Thanks

## 2014-10-21 ENCOUNTER — Other Ambulatory Visit: Payer: Self-pay | Admitting: Physician Assistant

## 2014-10-22 ENCOUNTER — Encounter (HOSPITAL_COMMUNITY): Payer: Self-pay | Admitting: Cardiovascular Disease

## 2014-10-28 ENCOUNTER — Telehealth: Payer: Self-pay | Admitting: Cardiology

## 2014-10-28 NOTE — Telephone Encounter (Signed)
Pt. To see Kerin Ransom at [address] at 0800 hrs on 10/29/2014 for c/o CP states feels ok today but wants to be checked out

## 2014-10-28 NOTE — Telephone Encounter (Signed)
New Message  Per Pt wife- pt yesterday (12/15) had cp on left side, took 2 nitro (under tongue) and a baby aspirin and felt better; today 12/16 feels better. Pt wife wants him to see someone. Please call back and discuss.

## 2014-10-29 ENCOUNTER — Encounter: Payer: Self-pay | Admitting: Cardiology

## 2014-10-29 ENCOUNTER — Ambulatory Visit (INDEPENDENT_AMBULATORY_CARE_PROVIDER_SITE_OTHER): Payer: Medicare Other | Admitting: Cardiology

## 2014-10-29 VITALS — BP 140/82 | HR 82 | Ht 71.0 in | Wt 230.0 lb

## 2014-10-29 DIAGNOSIS — N184 Chronic kidney disease, stage 4 (severe): Secondary | ICD-10-CM | POA: Insufficient documentation

## 2014-10-29 DIAGNOSIS — E785 Hyperlipidemia, unspecified: Secondary | ICD-10-CM

## 2014-10-29 DIAGNOSIS — N183 Chronic kidney disease, stage 3 unspecified: Secondary | ICD-10-CM

## 2014-10-29 DIAGNOSIS — I4891 Unspecified atrial fibrillation: Secondary | ICD-10-CM | POA: Insufficient documentation

## 2014-10-29 DIAGNOSIS — I208 Other forms of angina pectoris: Secondary | ICD-10-CM

## 2014-10-29 DIAGNOSIS — I10 Essential (primary) hypertension: Secondary | ICD-10-CM

## 2014-10-29 DIAGNOSIS — E1122 Type 2 diabetes mellitus with diabetic chronic kidney disease: Secondary | ICD-10-CM

## 2014-10-29 DIAGNOSIS — N1832 Chronic kidney disease, stage 3b: Secondary | ICD-10-CM | POA: Insufficient documentation

## 2014-10-29 DIAGNOSIS — I482 Chronic atrial fibrillation, unspecified: Secondary | ICD-10-CM

## 2014-10-29 DIAGNOSIS — N189 Chronic kidney disease, unspecified: Secondary | ICD-10-CM

## 2014-10-29 DIAGNOSIS — I25118 Atherosclerotic heart disease of native coronary artery with other forms of angina pectoris: Secondary | ICD-10-CM

## 2014-10-29 LAB — BASIC METABOLIC PANEL
BUN: 38 mg/dL — ABNORMAL HIGH (ref 6–23)
CO2: 24 mEq/L (ref 19–32)
Calcium: 9.7 mg/dL (ref 8.4–10.5)
Chloride: 102 mEq/L (ref 96–112)
Creatinine, Ser: 1.6 mg/dL — ABNORMAL HIGH (ref 0.4–1.5)
GFR: 44.63 mL/min — ABNORMAL LOW (ref >60.00)
Glucose, Bld: 133 mg/dL — ABNORMAL HIGH (ref 70–99)
Potassium: 4.2 mEq/L (ref 3.5–5.1)
Sodium: 137 mEq/L (ref 135–145)

## 2014-10-29 LAB — CBC
HCT: 43.5 % (ref 39.0–52.0)
Hemoglobin: 14.2 g/dL (ref 13.0–17.0)
MCHC: 32.7 g/dL (ref 30.0–36.0)
MCV: 88.8 fl (ref 78.0–100.0)
Platelets: 234 10*3/uL (ref 150.0–400.0)
RBC: 4.9 Mil/uL (ref 4.22–5.81)
RDW: 13.9 % (ref 11.5–15.5)
WBC: 9.2 10*3/uL (ref 4.0–10.5)

## 2014-10-29 LAB — TSH: TSH: 1.98 u[IU]/mL (ref 0.35–4.50)

## 2014-10-29 MED ORDER — APIXABAN 5 MG PO TABS: 5.0000 mg | ORAL_TABLET | Freq: Two times a day (BID) | ORAL | Status: DC

## 2014-10-29 NOTE — Assessment & Plan Note (Signed)
Last SCr 1.6 Sept 2015

## 2014-10-29 NOTE — Assessment & Plan Note (Signed)
Treated

## 2014-10-29 NOTE — Patient Instructions (Addendum)
Your physician has recommended you make the following change in your medication:    STOP TAKING PLAVIX   START TAKING ELIQUIS 5MG  TWICE A DAY  Your physician recommends that you return for lab work in:   BMP CBC TSH AND [place]   Your physician has requested that you have an echocardiogram. Echocardiography is a painless test that uses sound waves to create images of your heart. It provides your doctor with information about the size and shape of your heart and how well your heart's chambers and valves are working. This procedure takes approximately one hour. There are no restrictions for this procedure. SCHEDULE THE SAME DAY AS APPOINTMENT WITH [facility]   Your physician recommends that you schedule a follow-up appointment in:  IN ONE MONTH WITH DR [facility]   Atrial Fibrillation Atrial fibrillation is a type of irregular heart rhythm (arrhythmia). During atrial fibrillation, the upper chambers of the heart (atria) quiver continuously in a chaotic pattern. This causes an irregular and often rapid heart rate.  Atrial fibrillation is the result of the heart becoming overloaded with disorganized signals that tell it to beat. These signals are normally released one at a time by a part of the right atrium called the sinoatrial node. They then travel from the atria to the lower chambers of the heart (ventricles), causing the atria and ventricles to contract and pump blood as they pass. In atrial fibrillation, parts of the atria outside of the sinoatrial node also release these signals. This results in two problems. First, the atria receive so many signals that they do not have time to fully contract. Second, the ventricles, which can only receive one signal at a time, beat irregularly and out of rhythm with the atria.  There are three types of atrial fibrillation:   Paroxysmal. Paroxysmal atrial fibrillation starts suddenly and stops on its own within a week.  Persistent. Persistent atrial fibrillation  lasts for more than a week. It may stop on its own or with treatment.  Permanent. Permanent atrial fibrillation does not go away. Episodes of atrial fibrillation may lead to permanent atrial fibrillation. Atrial fibrillation can prevent your heart from pumping blood normally. It increases your risk of stroke and can lead to heart failure.  CAUSES   Heart conditions, including a heart attack, heart failure, coronary artery disease, and heart valve conditions.   Inflammation of the sac that surrounds the heart (pericarditis).  Blockage of an artery in the lungs (pulmonary embolism).  Pneumonia or other infections.  Chronic lung disease.  Thyroid problems, especially if the thyroid is overactive (hyperthyroidism).  Caffeine, excessive alcohol use, and use of some illegal drugs.   Use of some medicines, including certain decongestants and diet pills.  Heart surgery.   Birth defects.  Sometimes, no cause can be found. When this happens, the atrial fibrillation is called lone atrial fibrillation. The risk of complications from atrial fibrillation increases if you have lone atrial fibrillation and you are age 67 years or older. RISK FACTORS  Heart failure.  Coronary artery disease.  Diabetes mellitus.   High blood pressure (hypertension).   Obesity.   Other arrhythmias.   Increased age. SIGNS AND SYMPTOMS   A feeling that your heart is beating rapidly or irregularly.   A feeling of discomfort or pain in your chest.   Shortness of breath.   Sudden light-headedness or weakness.   Getting tired easily when exercising.   Urinating more often than normal (mainly when atrial fibrillation first begins).  In paroxysmal atrial fibrillation, symptoms may start and suddenly stop. DIAGNOSIS  Your health care provider may be able to detect atrial fibrillation when taking your pulse. Your health care provider may have you take a test called an ambulatory  electrocardiogram (ECG). An ECG records your heartbeat patterns over a 24-hour period. You may also have other tests, such as:  Transthoracic echocardiogram (TTE). During echocardiography, sound waves are used to evaluate how blood flows through your heart.  Transesophageal echocardiogram (TEE).  Stress test. There is more than one type of stress test. If a stress test is needed, ask your health care provider about which type is best for you.  Chest X-ray exam.  Blood tests.  Computed tomography (CT). TREATMENT  Treatment may include:  Treating any underlying conditions. For example, if you have an overactive thyroid, treating the condition may correct atrial fibrillation.  Taking medicine. Medicines may be given to control a rapid heart rate or to prevent blood clots, heart failure, or a stroke.  Having a procedure to correct the rhythm of the heart:  Electrical cardioversion. During electrical cardioversion, a controlled, low-energy shock is delivered to the heart through your skin. If you have chest pain, very low blood pressure, or sudden heart failure, this procedure may need to be done as an emergency.  Catheter ablation. During this procedure, heart tissues that send the signals that cause atrial fibrillation are destroyed.  Surgical ablation. During this surgery, thin lines of heart tissue that carry the abnormal signals are destroyed. This procedure can either be an open-heart surgery or a minimally invasive surgery. With the minimally invasive surgery, small cuts are made to access the heart instead of a large opening.  Pulmonary venous isolation. During this surgery, tissue around the veins that carry blood from the lungs (pulmonary veins) is destroyed. This tissue is thought to carry the abnormal signals. HOME CARE INSTRUCTIONS   Take medicines only as directed by your health care provider. Some medicines can make atrial fibrillation worse or recur.  If blood thinners  were prescribed by your health care provider, take them exactly as directed. Too much blood-thinning medicine can cause bleeding. If you take too little, you will not have the needed protection against stroke and other problems.  Perform blood tests at home if directed by your health care provider. Perform blood tests exactly as directed.  Quit smoking if you smoke.  Do not drink alcohol.  Do not drink caffeinated beverages such as coffee, soda, and some teas. You may drink decaffeinated coffee, soda, or tea.   Maintain a healthy weight.Do not use diet pills unless your health care provider approves. They may make heart problems worse.   Follow diet instructions as directed by your health care provider.  Exercise regularly as directed by your health care provider.  Keep all follow-up visits as directed by your health care provider. This is important. PREVENTION  The following substances can cause atrial fibrillation to recur:   Caffeinated beverages.  Alcohol.  Certain medicines, especially those used for breathing problems.  Certain herbs and herbal medicines, such as those containing ephedra or ginseng.  Illegal drugs, such as cocaine and amphetamines. Sometimes medicines are given to prevent atrial fibrillation from recurring. Proper treatment of any underlying condition is also important in helping prevent recurrence.  SEEK MEDICAL CARE IF:  You notice a change in the rate, rhythm, or strength of your heartbeat.  You suddenly begin urinating more frequently.  You tire more easily when exerting yourself  or exercising. SEEK IMMEDIATE MEDICAL CARE IF:   You have chest pain, abdominal pain, sweating, or weakness.  You feel nauseous.  You have shortness of breath.  You suddenly have swollen feet and ankles.  You feel dizzy.  Your face or limbs feel numb or weak.  You have a change in your vision or speech. MAKE SURE YOU:   Understand these instructions.  Will  watch your condition.  Will get help right away if you are not doing well or get worse. Document Released: 10/30/2005 Document Revised: 03/16/2014 Document Reviewed: 12/10/2012 [facility] Patient Information 2015 [place], [place]. This information is not intended to replace advice given to you by your health care provider. Make sure you discuss any questions you have with your health care provider.

## 2014-10-29 NOTE — Assessment & Plan Note (Signed)
Controlled.  

## 2014-10-29 NOTE — Progress Notes (Signed)
10/29/2014 Ethel Mara   1945/12/21  [id number]  Primary Physician Olga Millers, MD Primary Cardiologist: Dr Percival Spanish  HPI:  68 y/o male with a history of NSTEMI March 2015. 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 He was treated medically. He was last seen in June 2015. He is seen in the office today to be "checked out" after he had an episode of exertional chest pain 48 hrs ago. In the office he is noted to be in AF which is new for him. He admits he has had fatigue since the summer but was unaware of any tachycardia. $8 hrs ago he felt chest discomfort when he was going up and down stairs. His symptoms were relieved with NTG x 2 and rest.    Current Outpatient Prescriptions  Medication Sig Dispense Refill  . aspirin EC 81 MG EC tablet Take 1 tablet (81 mg total) by mouth daily.    Marland Kitchen atorvastatin (LIPITOR) 20 MG tablet Take 1 tablet (20 mg total) by mouth daily at 6 PM. 30 tablet 5  . metFORMIN (GLUCOPHAGE) 500 MG tablet Take 1 tablet (500 mg total) by mouth 2 (two) times daily with a meal. 60 tablet 6  . metoprolol tartrate (LOPRESSOR) 25 MG tablet TAKE 1 TABLET BY MOUTH TWICE DAILY 60 tablet 0  . Olmesartan-Amlodipine-HCTZ (TRIBENZOR) 40-10-25 MG TABS Take by mouth.    Marland Kitchen apixaban (ELIQUIS) 5 MG TABS tablet Take 1 tablet (5 mg total) by mouth 2 (two) times daily. 60 tablet 5  . nitroGLYCERIN (NITROSTAT) 0.4 MG SL tablet Place 1 tablet (0.4 mg total) under the tongue every 5 (five) minutes x 3 doses as needed for chest pain. (Patient not taking: Reported on 10/29/2014) 25 tablet 11   No current facility-administered medications for this visit.    No Known Allergies  History   Social History  . Marital Status: Married    Spouse Name: N/A    Number of Children: N/A  . Years of Education: N/A   Occupational History  . Not on file.   Social History Main Topics  . Smoking status: Never Smoker   . Smokeless tobacco: Not on file  . Alcohol Use: No  .  Drug Use: Not on file  . Sexual Activity: Not on file   Other Topics Concern  . Not on file   Social History Narrative     Review of Systems: General: negative for chills, fever, night sweats or weight changes.  Cardiovascular: negative for chest pain, dyspnea on exertion, edema, orthopnea, palpitations, paroxysmal nocturnal dyspnea or shortness of breath Dermatological: negative for rash Respiratory: negative for cough or wheezing Urologic: negative for hematuria Abdominal: negative for nausea, vomiting, diarrhea, bright red blood per rectum, melena, or hematemesis Neurologic: negative for visual changes, syncope, or dizziness All other systems reviewed and are otherwise negative except as noted above.    Blood pressure 140/82, pulse 82, height 5\' 11"  (1.803 m), weight 230 lb (104.327 kg).  General appearance: alert, cooperative and no distress Neck: no carotid bruit and no JVD Lungs: clear to auscultation bilaterally Heart: irregularly irregular rhythm Extremities: no edema Skin: pale, cool, dry Neurologic: Grossly normal  EKG AF with CVR, inferior Qs  ASSESSMENT AND PLAN:   Exertional angina Pt came today to be "checked out" after an episode of exertional chest pain 48 hrs ago.   Coronary Artery Disease Known CTO of RCA with aneurysmal dilatation of the CFX and slow flow  Atrial fibrillation,  new onset Pt was unaware of AF, last office visit June 2015 NSR by exam.  Essential hypertension Controlled  Diabetes mellitus, type 2 Type 2 NIDDM  Chronic renal disease, stage III Last SCr 1.6 Sept 2015  Dyslipidemia Treated   PLAN  Reviewed with Dr Irish Lack in the office today. Will start Eliquis 5 mg BID, stop Plavix, check labs and echo. His rate is controlled on Lopressor. F/U with Dr Percival Spanish in 4 weeks for possible DCCV.   Alishba Naples KPA-C 10/29/2014 9:46 AM

## 2014-10-29 NOTE — Assessment & Plan Note (Signed)
Pt came today to be "checked out" after an episode of exertional chest pain 48 hrs ago.

## 2014-10-29 NOTE — Assessment & Plan Note (Signed)
Pt was unaware of AF, last office visit June 2015 NSR by exam.

## 2014-10-29 NOTE — Assessment & Plan Note (Signed)
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

## 2014-10-29 NOTE — Assessment & Plan Note (Signed)
Type 2 NIDDM 

## 2014-10-30 ENCOUNTER — Other Ambulatory Visit: Payer: Self-pay | Admitting: Cardiology

## 2014-10-30 DIAGNOSIS — N183 Chronic kidney disease, stage 3 unspecified: Secondary | ICD-10-CM

## 2014-11-03 ENCOUNTER — Telehealth: Payer: Self-pay | Admitting: Cardiology

## 2014-11-03 NOTE — Telephone Encounter (Signed)
Pt called in stating that Lurena Joiner called in him in reference to a medication change and he was returning his call.   Thanks

## 2014-11-03 NOTE — Telephone Encounter (Signed)
Pt verifying lab results and new medication. No other questions. Pt voiced understanding.

## 2014-11-03 NOTE — Telephone Encounter (Signed)
LMTCB

## 2014-11-05 ENCOUNTER — Other Ambulatory Visit: Payer: Self-pay | Admitting: *Deleted

## 2014-11-05 ENCOUNTER — Encounter: Payer: Self-pay | Admitting: Cardiology

## 2014-11-05 MED ORDER — OLMESARTAN-AMLODIPINE-HCTZ 40-10-12.5 MG PO TABS: 1.0000 | ORAL_TABLET | Freq: Every day | ORAL | Status: DC

## 2014-11-05 NOTE — Telephone Encounter (Signed)
New message        Returning Adam Harris's call

## 2014-11-05 NOTE — Telephone Encounter (Signed)
This encounter was created in error - please disregard.

## 2014-11-12 ENCOUNTER — Other Ambulatory Visit: Payer: Self-pay | Admitting: Physician Assistant

## 2014-11-12 NOTE — Telephone Encounter (Signed)
E sent to pharm 

## 2014-12-01 ENCOUNTER — Other Ambulatory Visit: Payer: Self-pay | Admitting: Cardiology

## 2014-12-08 ENCOUNTER — Ambulatory Visit (HOSPITAL_COMMUNITY)
Admission: RE | Admit: 2014-12-08 | Discharge: 2014-12-08 | Disposition: A | Discharge status: Home / Self Care | Payer: Medicare Other | Source: Ambulatory Visit | Attending: Cardiology | Admitting: Cardiology

## 2014-12-08 ENCOUNTER — Ambulatory Visit (INDEPENDENT_AMBULATORY_CARE_PROVIDER_SITE_OTHER): Payer: Medicare Other | Admitting: Cardiology

## 2014-12-08 ENCOUNTER — Encounter: Payer: Self-pay | Admitting: Cardiology

## 2014-12-08 VITALS — BP 122/80 | HR 51 | Ht 71.0 in | Wt 227.0 lb

## 2014-12-08 DIAGNOSIS — I481 Persistent atrial fibrillation: Secondary | ICD-10-CM

## 2014-12-08 DIAGNOSIS — I482 Chronic atrial fibrillation, unspecified: Secondary | ICD-10-CM

## 2014-12-08 DIAGNOSIS — I4891 Unspecified atrial fibrillation: Secondary | ICD-10-CM

## 2014-12-08 NOTE — Progress Notes (Signed)
HPI The patient presents for follow up of CAD with a hospitalizatoin 3/24-3/26 with a non-STEMI. Cardiac catheterization demonstrated chronically occluded RCA and an aneurysmal circumflex with sluggish flow. Lifelong dual antiplatelet therapy was recommended. 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   He came back last month with some vague chest pain.  He was noted to be in atrial fib.  He did not really feel this.  The patient currently denies any new symptoms such as chest discomfort, neck or arm discomfort. There has been no new shortness of breath, PND or orthopnea. There have been no reported palpitations, presyncope or syncope.  He does do some exercising.  He was started on Eliquis at the last appt.  He is back for follow up.   No Known Allergies  Current Outpatient Prescriptions  Medication Sig Dispense Refill  . apixaban (ELIQUIS) 5 MG TABS tablet Take 1 tablet (5 mg total) by mouth 2 (two) times daily. 60 tablet 5  . aspirin EC 81 MG EC tablet Take 1 tablet (81 mg total) by mouth daily.    Marland Kitchen atorvastatin (LIPITOR) 20 MG tablet TAKE 1 TABLET BY MOUTH DAILY AT 6 PM 30 tablet 6  . metFORMIN (GLUCOPHAGE) 500 MG tablet Take 1 tablet (500 mg total) by mouth 2 (two) times daily with a meal. 60 tablet 6  . metoprolol tartrate (LOPRESSOR) 25 MG tablet Take ONE tablet by mouth twice daily 180 tablet 3  . nitroGLYCERIN (NITROSTAT) 0.4 MG SL tablet Place 1 tablet (0.4 mg total) under the tongue every 5 (five) minutes x 3 doses as needed for chest pain. (Patient not taking: Reported on 10/29/2014) 25 tablet 11  . Olmesartan-Amlodipine-HCTZ 40-10-12.5 MG TABS Take 1 tablet by mouth daily. 30 tablet 12   No current facility-administered medications for this visit.    Past Medical History  Diagnosis Date  . Hypertension   . Arthritis   . CAD (coronary artery disease)     Occluded RCA 12/2013    Past Surgical History  Procedure Laterality Date  . Knee arthroscopy    . Left  heart catheterization with coronary angiogram N/A 02/04/2014    Procedure: LEFT HEART CATHETERIZATION WITH CORONARY ANGIOGRAM;  Surgeon: [name], MD;  Location: [facility] CATH LAB;  Service: Cardiovascular;  Laterality: N/A;    ROS:  As stated in the HPI and negative for all other systems.  PHYSICAL EXAM There were no vitals taken for this visit. GENERAL:  Well appearing NECK:  No jugular venous distention, waveform within normal limits, carotid upstroke brisk and symmetric, no bruits, no thyromegaly LUNGS:  Clear to auscultation bilaterally BACK:  No CVA tenderness CHEST:  Unremarkable HEART:  PMI not displaced or sustained,S1 and S2 within normal limits, no S3, no S4, no clicks, no rubs, no murmurs ABD:  Flat, positive bowel sounds normal in frequency in pitch, no bruits, no rebound, no guarding, no midline pulsatile mass, no hepatomegaly, no splenomegaly EXT:  2 plus pulses throughout, no edema, no cyanosis no clubbing  EKG:    Sinus rhythm, rate 51, axis within normal limits, intervals within normal limits, no acute ST-T wave changes.  12/08/2014   ASSESSMENT AND PLAN  CAD:  The patient has no new sypmtoms.  No further cardiovascular testing is indicated.  We will continue with aggressive risk reduction and meds as listed.  DYSLIPIDEMIA:   His LD was 81 with an HDL of 40.5.  He will remain on the meds as listed.  HTN:  The blood pressure is at target. No change in medications is indicated. We will continue with therapeutic lifestyle changes (TLC).  ATRIAL FIB:  He is in NSR today.  He is not noticing this rhythm.  He tolerates anticoagulation.  No change in therapy is indicated.   Echo was done today.  Results are pending.   Mr. Avid Sollie has a CHA2DS2 - VASc score of 3 with a risk of stroke of 3.2%  and a HAS - BLED score of 1 with a low risk of bleeding.  We discussed at length the atrial fib.

## 2014-12-08 NOTE — Progress Notes (Signed)
2D Echo Performed 12/08/2014    Adam Harris, RCS

## 2014-12-08 NOTE — Patient Instructions (Signed)
Your physician recommends that you schedule a follow-up appointment in: 6 months with Dr. Hochrein  

## 2014-12-24 ENCOUNTER — Other Ambulatory Visit (INDEPENDENT_AMBULATORY_CARE_PROVIDER_SITE_OTHER): Payer: Medicare Other

## 2014-12-24 ENCOUNTER — Ambulatory Visit (INDEPENDENT_AMBULATORY_CARE_PROVIDER_SITE_OTHER): Payer: Medicare Other | Admitting: Internal Medicine

## 2014-12-24 ENCOUNTER — Encounter: Payer: Self-pay | Admitting: Internal Medicine

## 2014-12-24 VITALS — BP 150/82 | HR 65 | Temp 97.4°F | Resp 18 | Ht 71.0 in | Wt 229.4 lb

## 2014-12-24 DIAGNOSIS — Z418 Encounter for other procedures for purposes other than remedying health state: Secondary | ICD-10-CM

## 2014-12-24 DIAGNOSIS — Z299 Encounter for prophylactic measures, unspecified: Secondary | ICD-10-CM

## 2014-12-24 DIAGNOSIS — E119 Type 2 diabetes mellitus without complications: Secondary | ICD-10-CM

## 2014-12-24 DIAGNOSIS — Z23 Encounter for immunization: Secondary | ICD-10-CM | POA: Diagnosis not present

## 2014-12-24 DIAGNOSIS — Z0000 Encounter for general adult medical examination without abnormal findings: Secondary | ICD-10-CM

## 2014-12-24 DIAGNOSIS — R972 Elevated prostate specific antigen [PSA]: Secondary | ICD-10-CM

## 2014-12-24 DIAGNOSIS — I4891 Unspecified atrial fibrillation: Secondary | ICD-10-CM

## 2014-12-24 DIAGNOSIS — E1122 Type 2 diabetes mellitus with diabetic chronic kidney disease: Secondary | ICD-10-CM

## 2014-12-24 DIAGNOSIS — Z1211 Encounter for screening for malignant neoplasm of colon: Secondary | ICD-10-CM

## 2014-12-24 LAB — BASIC METABOLIC PANEL
BUN: 33 mg/dL — ABNORMAL HIGH (ref 6–23)
CO2: 29 mEq/L (ref 19–32)
Calcium: 9.7 mg/dL (ref 8.4–10.5)
Chloride: 106 mEq/L (ref 96–112)
Creatinine, Ser: 1.26 mg/dL (ref 0.40–1.50)
GFR: 60.47 mL/min (ref >60.00)
Glucose, Bld: 122 mg/dL — ABNORMAL HIGH (ref 70–99)
Potassium: 4.3 mEq/L (ref 3.5–5.1)
SODIUM: 141 meq/L (ref 135–145)

## 2014-12-24 LAB — PSA: PSA: 2.69 ng/mL (ref 0.10–4.00)

## 2014-12-24 LAB — MICROALBUMIN / CREATININE URINE RATIO
Creatinine,U: 151.1 mg/dL
MICROALB/CREAT RATIO: 3.4 mg/g (ref 0.0–30.0)
Microalb, Ur: 5.1 mg/dL — ABNORMAL HIGH (ref 0.0–1.9)

## 2014-12-24 LAB — HEMOGLOBIN A1C: Hgb A1c MFr Bld: 6.5 % (ref 4.6–6.5)

## 2014-12-24 NOTE — Progress Notes (Signed)
   Subjective:    Patient ID: Adam Harris, male    DOB: 1946/09/05, 69 y.o.   MRN: [id number]  HPI The patient is a 69 YO man who is coming in today for wellness. He has been diagnosed with paroxysmal atrial fibrillation since our last visit. He has then in sinus rhytmn when he came back. He is currently taking anticoagulation for it and will follow up with cardiology to see if this is necessary long term. He is dealing with some heel pain and may get injection to see if this helps soon. Trying exercises as well.  Here for medicare wellness  Diet: heart healthy Physical activity: sedentary Depression/mood screen: negative Hearing: intact to whispered voice Visual acuity: grossly normal, needs eye exam and reminded about that ADLs: capable Fall risk: none Home safety: good Cognitive evaluation: intact to orientation, naming, recall and repetition EOL planning: adv directives, full code/ I agree  I have personally reviewed and have noted 1. The patient's medical and social history 2. Their use of alcohol, tobacco or illicit drugs 3. Their current medications and supplements 4. The patient's functional ability including ADL's, fall risks, home safety risks and hearing or visual impairment. 5. Diet and physical activities 6. Evidence for depression or mood disorders 7. Care team reviewed and updated  Review of Systems  Constitutional: Negative for fever, chills, activity change, appetite change and fatigue.  HENT: Negative.   Eyes: Negative.   Respiratory: Negative for cough, chest tightness, shortness of breath and wheezing.   Cardiovascular: Negative for chest pain, palpitations and leg swelling.  Gastrointestinal: Negative for abdominal pain, diarrhea, constipation and abdominal distention.  Genitourinary: Negative.   Musculoskeletal: Positive for myalgias. Negative for back pain, joint swelling and gait problem.  Skin: Negative.   Neurological: Negative for dizziness, weakness,  numbness and headaches.  Psychiatric/Behavioral: Negative.       Objective:   Physical Exam  Constitutional: He is oriented to person, place, and time. He appears well-developed and well-nourished.  Overweight  HENT:  Head: Normocephalic and atraumatic.  Eyes: EOM are normal.  Neck: Normal range of motion.  Cardiovascular: Normal rate and regular rhythm.   No murmur heard. Pulmonary/Chest: Effort normal and breath sounds normal. No respiratory distress. He has no wheezes. He has no rales.  Abdominal: Soft. Bowel sounds are normal. He exhibits no distension. There is no tenderness. There is no rebound.  Musculoskeletal:  Left heel tenderness to palpation, no skin changes or swelling. Good pulses to the feet bilaterally with PT and DP.  Neurological: He is alert and oriented to person, place, and time.  Skin: Skin is warm and dry.  Vitals reviewed.  Filed Vitals:   12/24/14 0947  BP: 150/82  Pulse: 65  Temp: 97.4 F (36.3 C)  TempSrc: Oral  Resp: 18  Height: 5\' 11"  (1.803 m)  Weight: 229 lb 6.4 oz (104.055 kg)  SpO2: 98%      Assessment & Plan:  Tdap and Pneumonia 23 given today.

## 2014-12-24 NOTE — Patient Instructions (Signed)
We will send you to get the colon cancer screening and you should hear back about scheduling that.   We are going to check the blood and urine today to check on the kidneys and the PSA. We will call you back about the results.  Work on losing about 5 pounds before you come back. We will see you back in about 6 months to check again on the diabetes and the cholesterol.   Exercise to Lose Weight Exercise and a healthy diet may help you lose weight. Your doctor may suggest specific exercises. EXERCISE IDEAS AND TIPS  Choose low-cost things you enjoy doing, such as walking, bicycling, or exercising to workout videos.  Take stairs instead of the elevator.  Walk during your lunch break.  Park your car further away from work or school.  Go to a gym or an exercise class.  Start with 5 to 10 minutes of exercise each day. Build up to 30 minutes of exercise 4 to 6 days a week.  Wear shoes with good support and comfortable clothes.  Stretch before and after working out.  Work out until you breathe harder and your heart beats faster.  Drink extra water when you exercise.  Do not do so much that you hurt yourself, feel dizzy, or get very short of breath. Exercises that burn about 150 calories:  Running 1  miles in 15 minutes.  Playing volleyball for 45 to 60 minutes.  Washing and waxing a car for 45 to 60 minutes.  Playing touch football for 45 minutes.  Walking 1  miles in 35 minutes.  Pushing a stroller 1  miles in 30 minutes.  Playing basketball for 30 minutes.  Raking leaves for 30 minutes.  Bicycling 5 miles in 30 minutes.  Walking 2 miles in 30 minutes.  Dancing for 30 minutes.  Shoveling snow for 15 minutes.  Swimming laps for 20 minutes.  Walking up stairs for 15 minutes.  Bicycling 4 miles in 15 minutes.  Gardening for 30 to 45 minutes.  Jumping rope for 15 minutes.  Washing windows or floors for 45 to 60 minutes. Document Released: 12/02/2010  Document Revised: 01/22/2012 Document Reviewed: 12/02/2010 [facility] Patient Information 2015 [place], [place]. This information is not intended to replace advice given to you by your health care provider. Make sure you discuss any questions you have with your health care provider.

## 2014-12-24 NOTE — Progress Notes (Signed)
Pre visit review using our clinic review tool, if applicable. No additional management support is needed unless otherwise documented below in the visit note. 

## 2014-12-25 ENCOUNTER — Encounter: Payer: Self-pay | Admitting: Internal Medicine

## 2014-12-25 DIAGNOSIS — Z1211 Encounter for screening for malignant neoplasm of colon: Secondary | ICD-10-CM | POA: Insufficient documentation

## 2014-12-25 DIAGNOSIS — Z0000 Encounter for general adult medical examination without abnormal findings: Secondary | ICD-10-CM | POA: Insufficient documentation

## 2014-12-25 NOTE — Assessment & Plan Note (Signed)
Check HgA1c and microalbumin to creatinine ratio. He is currently taking metformin and will continue. Check BMP to ensure adequate kidney function.

## 2014-12-25 NOTE — Assessment & Plan Note (Signed)
He is overdue for colonoscopy and referral placed for GI today. Check labs. Given tdap and pneumonia 23 today. He is up to date on his flu shot. Still needs shingles shot and talked to him about it and he wants to think about it. Also overdue for eye exam and reminded him about that.

## 2014-12-25 NOTE — Assessment & Plan Note (Signed)
Check PSA today. 

## 2014-12-25 NOTE — Assessment & Plan Note (Signed)
Unclear to me if he still warrants anticoagulation as isolated incident. Will continue to monitor but regular rhythm today on exam. Rate at goal of <110.

## 2014-12-31 ENCOUNTER — Ambulatory Visit: Payer: Medicare Other

## 2014-12-31 VITALS — BP 120/78

## 2014-12-31 DIAGNOSIS — Z0130 Encounter for examination of blood pressure without abnormal findings: Secondary | ICD-10-CM

## 2015-01-28 ENCOUNTER — Ambulatory Visit: Payer: Medicare Other | Admitting: Internal Medicine

## 2015-01-29 ENCOUNTER — Encounter: Payer: Self-pay | Admitting: Internal Medicine

## 2015-06-03 ENCOUNTER — Telehealth: Payer: Self-pay | Admitting: Cardiology

## 2015-06-03 NOTE — Telephone Encounter (Signed)
Spoke to wife.  Patient had episode of midsternal chest pain last night. Was relieved w/ pepto bismol, however, head was cold and clammy shortly after.  He took [employer], 3x81mg  ASA. Reported feeling better, went back to sleep.  Pt not having problems today as he reports to wife, but she expresses some concern. She felt he overdid some manual labor previous day which may have been too much for him.  Advised ER if symptoms return/still having symptoms. 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  Wife wanted to set up appt w/ Dr. Percival Spanish - patient due for 6 mo f/u. Will defer to see what is advised - next available routine schedule openings in September. Reinforced instruction to go to ER w/ active symptoms. Understanding verbalized.

## 2015-06-03 NOTE — Telephone Encounter (Signed)
Returned call w/ Dr. Rosezella Florida recommendations. Wife of patient received information. voiced understanding. Informed her will have scheduling call her to set up provider appt.

## 2015-06-03 NOTE — Telephone Encounter (Signed)
Adam Harris is calling because Adam Harris is having some pain in his chest , he was cold and clamy . Please call    Thanks

## 2015-06-03 NOTE — Telephone Encounter (Signed)
Needs to go to ED with recurrent symptoms.   Otherwise follow up at next available appt with me or APP. r

## 2015-06-24 ENCOUNTER — Ambulatory Visit: Payer: Medicare Other | Admitting: Internal Medicine

## 2015-06-25 ENCOUNTER — Other Ambulatory Visit: Payer: Self-pay | Admitting: Internal Medicine

## 2015-07-08 ENCOUNTER — Encounter: Payer: Self-pay | Admitting: Cardiology

## 2015-07-08 ENCOUNTER — Ambulatory Visit (INDEPENDENT_AMBULATORY_CARE_PROVIDER_SITE_OTHER): Payer: Medicare Other | Admitting: Cardiology

## 2015-07-08 VITALS — BP 138/82 | HR 56 | Ht 71.0 in | Wt 226.0 lb

## 2015-07-08 DIAGNOSIS — I10 Essential (primary) hypertension: Secondary | ICD-10-CM | POA: Diagnosis not present

## 2015-07-08 DIAGNOSIS — I25118 Atherosclerotic heart disease of native coronary artery with other forms of angina pectoris: Secondary | ICD-10-CM

## 2015-07-08 DIAGNOSIS — I4891 Unspecified atrial fibrillation: Secondary | ICD-10-CM

## 2015-07-08 DIAGNOSIS — E785 Hyperlipidemia, unspecified: Secondary | ICD-10-CM

## 2015-07-08 MED ORDER — ISOSORBIDE MONONITRATE ER 30 MG PO TB24: 30.0000 mg | ORAL_TABLET | Freq: Every day | ORAL | Status: DC

## 2015-07-08 NOTE — Progress Notes (Signed)
HPI The patient presents for follow up of CAD with a hospitalizatoin 3/24-3/26/15 with a non-STEMI. Cardiac catheterization demonstrated chronically occluded RCA and an aneurysmal circumflex with sluggish flow. Lifelong dual antiplatelet therapy was recommended.  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   He was subsequently found to be in atrial fib.  He did not really feel this.  He is now on ASA and Eliquis  He is  Working at [employer] He periodically gets some chest discomfort this is very rare. He takes a nitroglycerin rarely. They did call me in on 7/21 with some symptoms that were treated with TUMs and he thought it was GI.   He has had occasional back pain. None of this is very reminiscent of his previous arm pain which was his angina. He is able to do activities such as sometimes loading or moving stock without limitations.   He denies any new shortness of breath, PND or orthopnea. He's had no palpitations, presyncope or syncope.   No Known Allergies  Current Outpatient Prescriptions  Medication Sig Dispense Refill  . apixaban (ELIQUIS) 5 MG TABS tablet Take 1 tablet (5 mg total) by mouth 2 (two) times daily. 60 tablet 5  . aspirin EC 81 MG EC tablet Take 1 tablet (81 mg total) by mouth daily.    Marland Kitchen atorvastatin (LIPITOR) 20 MG tablet Take 20 mg by mouth every evening.  5  . metFORMIN (GLUCOPHAGE) 500 MG tablet Take ONE tablet by mouth twice daily with meals 60 tablet 1  . metoprolol tartrate (LOPRESSOR) 25 MG tablet Take ONE tablet by mouth twice daily 180 tablet 3  . nitroGLYCERIN (NITROSTAT) 0.4 MG SL tablet Place 1 tablet (0.4 mg total) under the tongue every 5 (five) minutes x 3 doses as needed for chest pain. 25 tablet 11  . Olmesartan-Amlodipine-HCTZ 40-10-12.5 MG TABS Take 1 tablet by mouth daily. 30 tablet 12   No current facility-administered medications for this visit.    Past Medical History  Diagnosis Date  . Hypertension   . Arthritis   . CAD (coronary  artery disease)     Occluded RCA 12/2013  . Atrial fibrillation     Past Surgical History  Procedure Laterality Date  . Knee arthroscopy    . Left heart catheterization with coronary angiogram N/A 02/04/2014    Procedure: LEFT HEART CATHETERIZATION WITH CORONARY ANGIOGRAM;  Surgeon: [name], MD;  Location: [facility];  Service: Cardiovascular;  Laterality: N/A;    ROS:  As stated in the HPI and negative for all other systems.  PHYSICAL EXAM BP 138/82 mmHg  Pulse 56  Ht 5\' 11"  (1.803 m)  Wt 226 lb (102.513 kg)  BMI 31.53 kg/m2 GENERAL:  Well appearing NECK:  No jugular venous distention, waveform within normal limits, carotid upstroke brisk and symmetric, no bruits, no thyromegaly LUNGS:  Clear to auscultation bilaterally BACK:  No CVA tenderness CHEST:  Unremarkable HEART:  PMI not displaced or sustained,S1 and S2 within normal limits, no S3, no S4, no clicks, no rubs, no murmurs ABD:  Flat, positive bowel sounds normal in frequency in pitch, no bruits, no rebound, no guarding, no midline pulsatile mass, no hepatomegaly, no splenomegaly EXT:  2 plus pulses throughout, no edema, no cyanosis no clubbing  EKG:    Sinus rhythm, rate 56, axis within normal limits, intervals within normal limits,  he does have some mild lateral T-wave inversions are slightly more pronounced than previous but had been  present on other EKG well.  07/08/2015   ASSESSMENT AND PLAN  CAD:  The patient has atypical chest pain. He has an occluded RCA  Which could cause symptoms. At this point I think he needs aggressive medical management. Since his symptoms are mild and seem to be stable I would not consider treatment of his CTO. However, we could consider this if he has continued recurrent symptoms or perhaps  high-risk findings on stress testing in the future.  We will continue with aggressive risk reduction and meds as listed.  DYSLIPIDEMIA:   His LD was 81 with an HDL of 40.5.  He will remain on the  meds as listed.   This was last June. I will order another fasting lipid profile.  HTN:  The blood pressure is at target. No change in medications is indicated. We will continue with therapeutic lifestyle changes (TLC).  ATRIAL FIB:  He is in NSR today.  He is not noticing this rhythm.  He tolerates anticoagulation.  No change in therapy is indicated.  .   Mr. Paramveer Swoveland has a CHA2DS2 - VASc score of 3 with a risk of stroke of 3.2%

## 2015-07-08 NOTE — Patient Instructions (Signed)
Your physician wants you to follow-up in: 6 Months. You will receive a reminder letter in the mail two months in advance. If you don't receive a letter, please call our office to schedule the follow-up appointment.  Your physician has recommended you make the following change in your medication: START Isosorbide 30 mg daily  Your physician recommends that you return for lab work Fast Lipids

## 2015-07-13 ENCOUNTER — Ambulatory Visit: Payer: Medicare Other | Admitting: Internal Medicine

## 2015-07-13 DIAGNOSIS — Z0289 Encounter for other administrative examinations: Secondary | ICD-10-CM

## 2015-08-13 ENCOUNTER — Other Ambulatory Visit: Payer: Self-pay | Admitting: Physician Assistant

## 2015-08-13 NOTE — Telephone Encounter (Signed)
REFILL 

## 2015-09-24 ENCOUNTER — Other Ambulatory Visit: Payer: Self-pay | Admitting: Cardiology

## 2015-09-29 ENCOUNTER — Other Ambulatory Visit: Payer: Self-pay | Admitting: Internal Medicine

## 2015-10-28 ENCOUNTER — Other Ambulatory Visit: Payer: Self-pay | Admitting: Physician Assistant

## 2015-10-29 NOTE — Telephone Encounter (Signed)
Rx request sent to pharmacy.  

## 2015-12-10 ENCOUNTER — Other Ambulatory Visit: Payer: Self-pay | Admitting: Cardiology

## 2015-12-16 DIAGNOSIS — C61 Malignant neoplasm of prostate: Secondary | ICD-10-CM | POA: Diagnosis not present

## 2015-12-16 DIAGNOSIS — R8271 Bacteriuria: Secondary | ICD-10-CM | POA: Diagnosis not present

## 2015-12-16 DIAGNOSIS — Z0000 Encounter for general adult medical examination without abnormal findings: Secondary | ICD-10-CM | POA: Diagnosis not present

## 2016-01-06 ENCOUNTER — Other Ambulatory Visit: Payer: Self-pay | Admitting: Cardiology

## 2016-01-06 NOTE — Telephone Encounter (Signed)
Rx(s) sent to pharmacy electronically.  

## 2016-02-18 ENCOUNTER — Other Ambulatory Visit: Payer: Self-pay | Admitting: Cardiology

## 2016-02-18 NOTE — Telephone Encounter (Signed)
Rx(s) sent to pharmacy electronically.  

## 2016-02-19 ENCOUNTER — Other Ambulatory Visit: Payer: Self-pay | Admitting: Cardiology

## 2016-02-21 NOTE — Telephone Encounter (Signed)
REFILL 

## 2016-05-22 ENCOUNTER — Other Ambulatory Visit (INDEPENDENT_AMBULATORY_CARE_PROVIDER_SITE_OTHER): Payer: Medicare Other

## 2016-05-22 ENCOUNTER — Ambulatory Visit (INDEPENDENT_AMBULATORY_CARE_PROVIDER_SITE_OTHER): Payer: Medicare Other | Admitting: Internal Medicine

## 2016-05-22 ENCOUNTER — Encounter: Payer: Self-pay | Admitting: Internal Medicine

## 2016-05-22 VITALS — BP 162/88 | HR 67 | Temp 98.6°F | Resp 18 | Ht 71.0 in | Wt 230.0 lb

## 2016-05-22 DIAGNOSIS — E119 Type 2 diabetes mellitus without complications: Secondary | ICD-10-CM

## 2016-05-22 DIAGNOSIS — I10 Essential (primary) hypertension: Secondary | ICD-10-CM | POA: Diagnosis not present

## 2016-05-22 DIAGNOSIS — Z1159 Encounter for screening for other viral diseases: Secondary | ICD-10-CM

## 2016-05-22 DIAGNOSIS — E785 Hyperlipidemia, unspecified: Secondary | ICD-10-CM

## 2016-05-22 DIAGNOSIS — Z23 Encounter for immunization: Secondary | ICD-10-CM | POA: Diagnosis not present

## 2016-05-22 DIAGNOSIS — Z0000 Encounter for general adult medical examination without abnormal findings: Secondary | ICD-10-CM

## 2016-05-22 DIAGNOSIS — Z1211 Encounter for screening for malignant neoplasm of colon: Secondary | ICD-10-CM

## 2016-05-22 LAB — CBC
HCT: 44.7 % (ref 39.0–52.0)
Hemoglobin: 15 g/dL (ref 13.0–17.0)
MCHC: 33.6 g/dL (ref 30.0–36.0)
MCV: 85.2 fl (ref 78.0–100.0)
Platelets: 214 10*3/uL (ref 150.0–400.0)
RBC: 5.25 Mil/uL (ref 4.22–5.81)
RDW: 13.7 % (ref 11.5–15.5)
WBC: 7.9 10*3/uL (ref 4.0–10.5)

## 2016-05-22 LAB — LIPID PANEL
CHOL/HDL RATIO: 5
Cholesterol: 178 mg/dL (ref 0–200)
HDL: 37.4 mg/dL — AB (ref >39.00)
LDL CALC: 116 mg/dL — AB (ref 0–99)
NONHDL: 140.7
TRIGLYCERIDES: 122 mg/dL (ref 0.0–149.0)
VLDL: 24.4 mg/dL (ref 0.0–40.0)

## 2016-05-22 LAB — MICROALBUMIN / CREATININE URINE RATIO
Creatinine,U: 196.1 mg/dL
MICROALB/CREAT RATIO: 0.5 mg/g (ref 0.0–30.0)
Microalb, Ur: 1 mg/dL (ref 0.0–1.9)

## 2016-05-22 LAB — COMPREHENSIVE METABOLIC PANEL
ALT: 11 U/L (ref 0–53)
AST: 14 U/L (ref 0–37)
Albumin: 4.5 g/dL (ref 3.5–5.2)
Alkaline Phosphatase: 70 U/L (ref 39–117)
BILIRUBIN TOTAL: 1.1 mg/dL (ref 0.2–1.2)
BUN: 30 mg/dL — ABNORMAL HIGH (ref 6–23)
CALCIUM: 9.8 mg/dL (ref 8.4–10.5)
CHLORIDE: 105 meq/L (ref 96–112)
CO2: 29 meq/L (ref 19–32)
Creatinine, Ser: 1.52 mg/dL — ABNORMAL HIGH (ref 0.40–1.50)
GFR: 48.5 mL/min — AB (ref >60.00)
GLUCOSE: 118 mg/dL — AB (ref 70–99)
Potassium: 4.5 mEq/L (ref 3.5–5.1)
SODIUM: 140 meq/L (ref 135–145)
Total Protein: 7.4 g/dL (ref 6.0–8.3)

## 2016-05-22 LAB — HEMOGLOBIN A1C: Hgb A1c MFr Bld: 6.7 % — ABNORMAL HIGH (ref 4.6–6.5)

## 2016-05-22 NOTE — Patient Instructions (Signed)
We have given you the pneumonia shot today.  We are checking the blood and urine today.  You do need to go see the eye doctor for an eye exam since you have the diabetes.   Diabetes and Standards of Medical Care Diabetes is complicated. You may find that your diabetes team includes a dietitian, nurse, diabetes educator, eye doctor, and more. To help everyone know what is going on and to help you get the care you deserve, the following schedule of care was developed to help keep you on track. Below are the tests, exams, vaccines, medicines, education, and plans you will need. HbA1c test This test shows how well you have controlled your glucose over the past 2-3 months. It is used to see if your diabetes management plan needs to be adjusted.   It is performed at least 2 times a year if you are meeting treatment goals.  It is performed 4 times a year if therapy has changed or if you are not meeting treatment goals. Blood pressure test  This test is performed at every routine medical visit. The goal is less than 140/90 mm Hg for most people, but 130/80 mm Hg in some cases. Ask your health care provider about your goal. Dental exam  Follow up with the dentist regularly. Eye exam  If you are diagnosed with type 1 diabetes as a child, get an exam upon reaching the age of 13 years or older and having had diabetes for 3-5 years. Yearly eye exams are recommended after that initial eye exam.  If you are diagnosed with type 1 diabetes as an adult, get an exam within 5 years of diagnosis and then yearly.  If you are diagnosed with type 2 diabetes, get an exam as soon as possible after the diagnosis and then yearly. Foot care exam  Visual foot exams are performed at every routine medical visit. The exams check for cuts, injuries, or other problems with the feet.  You should have a complete foot exam performed every year. This exam includes an inspection of the structure and skin of your feet, a  check of the pulses in your feet, and a check of the sensation in your feet.  Type 1 diabetes: The first exam is performed 5 years after diagnosis.  Type 2 diabetes: The first exam is performed at the time of diagnosis.  Check your feet nightly for cuts, injuries, or other problems with your feet. Tell your health care provider if anything is not healing. Kidney function test (urine microalbumin)  This test is performed once a year.  Type 1 diabetes: The first test is performed 5 years after diagnosis.  Type 2 diabetes: The first test is performed at the time of diagnosis.  A serum creatinine and estimated glomerular filtration rate (eGFR) test is done once a year to assess the level of chronic kidney disease (CKD), if present. Lipid profile (cholesterol, HDL, LDL, triglycerides)  Performed every 5 years for most people.  The goal for LDL is less than 100 mg/dL. If you are at high risk, the goal is less than 70 mg/dL.  The goal for HDL is 40 mg/dL-50 mg/dL for men and 50 mg/dL-60 mg/dL for women. An HDL cholesterol of 60 mg/dL or higher gives some protection against heart disease.  The goal for triglycerides is less than 150 mg/dL. Immunizations  The flu (influenza) vaccine is recommended yearly for every person 61 months of age or older who has diabetes.  The pneumonia (pneumococcal)  vaccine is recommended for every person 37 years of age or older who has diabetes. Adults 39 years of age or older may receive the pneumonia vaccine as a series of two separate shots.  The hepatitis B vaccine is recommended for adults shortly after they have been diagnosed with diabetes.  The Tdap (tetanus, diphtheria, and pertussis) vaccine should be given:  According to normal childhood vaccination schedules, for children.  Every 10 years, for adults who have diabetes. Diabetes self-management education  Education is recommended at diagnosis and ongoing as needed. Treatment plan  Your  treatment plan is reviewed at every medical visit.   This information is not intended to replace advice given to you by your health care provider. Make sure you discuss any questions you have with your health care provider.   Document Released: 08/27/2009 Document Revised: 11/20/2014 Document Reviewed: 04/01/2013 Elsevier Interactive Patient Education 2016 [place] Maintenance, Male A healthy lifestyle and preventative care can promote health and wellness.  Maintain regular health, dental, and eye exams.  Eat a healthy diet. Foods like vegetables, fruits, whole grains, low-fat dairy products, and lean protein foods contain the nutrients you need and are low in calories. Decrease your intake of foods high in solid fats, added sugars, and salt. Get information about a proper diet from your health care provider, if necessary.  Regular physical exercise is one of the most important things you can do for your health. Most adults should get at least 150 minutes of moderate-intensity exercise (any activity that increases your heart rate and causes you to sweat) each week. In addition, most adults need muscle-strengthening exercises on 2 or more days a week.   Maintain a healthy weight. The body mass index (BMI) is a screening tool to identify possible weight problems. It provides an estimate of body fat based on height and weight. Your health care provider can find your BMI and can help you achieve or maintain a healthy weight. For males 20 years and older:  A BMI below 18.5 is considered underweight.  A BMI of 18.5 to 24.9 is normal.  A BMI of 25 to 29.9 is considered overweight.  A BMI of 30 and above is considered obese.  Maintain normal blood lipids and cholesterol by exercising and minimizing your intake of saturated fat. Eat a balanced diet with plenty of fruits and vegetables. Blood tests for lipids and cholesterol should begin at age 4 and be repeated every 5 years. If your  lipid or cholesterol levels are high, you are over age 14, or you are at high risk for heart disease, you may need your cholesterol levels checked more frequently.Ongoing high lipid and cholesterol levels should be treated with medicines if diet and exercise are not working.  If you smoke, find out from your health care provider how to quit. If you do not use tobacco, do not start.  Lung cancer screening is recommended for adults aged 10-80 years who are at high risk for developing lung cancer because of a history of smoking. A yearly low-dose CT scan of the lungs is recommended for people who have at least a 30-pack-year history of smoking and are current smokers or have quit within the past 15 years. A pack year of smoking is smoking an average of 1 pack of cigarettes a day for 1 year (for example, a 30-pack-year history of smoking could mean smoking 1 pack a day for 30 years or 2 packs a day for 15 years). Yearly  screening should continue until the smoker has stopped smoking for at least 15 years. Yearly screening should be stopped for people who develop a health problem that would prevent them from having lung cancer treatment.  If you choose to drink alcohol, do not have more than 2 drinks per day. One drink is considered to be 12 oz (360 mL) of beer, 5 oz (150 mL) of wine, or 1.5 oz (45 mL) of liquor.  Avoid the use of street drugs. Do not share needles with anyone. Ask for help if you need support or instructions about stopping the use of drugs.  High blood pressure causes heart disease and increases the risk of stroke. High blood pressure is more likely to develop in:  People who have blood pressure in the end of the normal range (100-139/85-89 mm Hg).  People who are overweight or obese.  People who are African American.  If you are 47-73 years of age, have your blood pressure checked every 3-5 years. If you are 27 years of age or older, have your blood pressure checked every year. You  should have your blood pressure measured twice--once when you are at a hospital or clinic, and once when you are not at a hospital or clinic. Record the average of the two measurements. To check your blood pressure when you are not at a hospital or clinic, you can use:  An automated blood pressure machine at a pharmacy.  A home blood pressure monitor.  If you are 19-26 years old, ask your health care provider if you should take aspirin to prevent heart disease.  Diabetes screening involves taking a blood sample to check your fasting blood sugar level. This should be done once every 3 years after age 110 if you are at a normal weight and without risk factors for diabetes. Testing should be considered at a younger age or be carried out more frequently if you are overweight and have at least 1 risk factor for diabetes.  Colorectal cancer can be detected and often prevented. Most routine colorectal cancer screening begins at the age of 24 and continues through age 26. However, your health care provider may recommend screening at an earlier age if you have risk factors for colon cancer. On a yearly basis, your health care provider may provide home test kits to check for hidden blood in the stool. A small camera at the end of a tube may be used to directly examine the colon (sigmoidoscopy or colonoscopy) to detect the earliest forms of colorectal cancer. Talk to your health care provider about this at age 85 when routine screening begins. A direct exam of the colon should be repeated every 5-10 years through age 58, unless early forms of precancerous polyps or small growths are found.  People who are at an increased risk for hepatitis B should be screened for this virus. You are considered at high risk for hepatitis B if:  You were born in a country where hepatitis B occurs often. Talk with your health care provider about which countries are considered high risk.  Your parents were born in a high-risk  country and you have not received a shot to protect against hepatitis B (hepatitis B vaccine).  You have HIV or AIDS.  You use needles to inject street drugs.  You live with, or have sex with, someone who has hepatitis B.  You are a man who has sex with other men (MSM).  You get hemodialysis treatment.  You take  certain medicines for conditions like cancer, organ transplantation, and autoimmune conditions.  Hepatitis C blood testing is recommended for all people born from [birth year] through 1964 and any individual with known risk factors for hepatitis C.  Healthy men should no longer receive prostate-specific antigen (PSA) blood tests as part of routine cancer screening. Talk to your health care provider about prostate cancer screening.  Testicular cancer screening is not recommended for adolescents or adult males who have no symptoms. Screening includes self-exam, a health care provider exam, and other screening tests. Consult with your health care provider about any symptoms you have or any concerns you have about testicular cancer.  Practice safe sex. Use condoms and avoid high-risk sexual practices to reduce the spread of sexually transmitted infections (STIs).  You should be screened for STIs, including gonorrhea and chlamydia if:  You are sexually active and are younger than 24 years.  You are older than 24 years, and your health care provider tells you that you are at risk for this type of infection.  Your sexual activity has changed since you were last screened, and you are at an increased risk for chlamydia or gonorrhea. Ask your health care provider if you are at risk.  If you are at risk of being infected with HIV, it is recommended that you take a prescription medicine daily to prevent HIV infection. This is called pre-exposure prophylaxis (PrEP). You are considered at risk if:  You are a man who has sex with other men (MSM).  You are a heterosexual man who is sexually active  with multiple partners.  You take drugs by injection.  You are sexually active with a partner who has HIV.  Talk with your health care provider about whether you are at high risk of being infected with HIV. If you choose to begin PrEP, you should first be tested for HIV. You should then be tested every 3 months for as long as you are taking PrEP.  Use sunscreen. Apply sunscreen liberally and repeatedly throughout the day. You should seek shade when your shadow is shorter than you. Protect yourself by wearing long sleeves, pants, a wide-brimmed hat, and sunglasses year round whenever you are outdoors.  Tell your health care provider of new moles or changes in moles, especially if there is a change in shape or color. Also, tell your health care provider if a mole is larger than the size of a pencil eraser.  A one-time screening for abdominal aortic aneurysm (AAA) and surgical repair of large AAAs by ultrasound is recommended for men aged 71-75 years who are current or former smokers.  Stay current with your vaccines (immunizations).   This information is not intended to replace advice given to you by your health care provider. Make sure you discuss any questions you have with your health care provider.   Document Released: 04/27/2008 Document Revised: 11/20/2014 Document Reviewed: 03/27/2011 Elsevier Interactive Patient Education [employer].

## 2016-05-22 NOTE — Progress Notes (Signed)
   Subjective:    Patient ID: Adam Harris, male    DOB: 1946/11/11, 70 y.o.   MRN: [id number]  HPI Here for medicare wellness, no new complaints. Please see A/P for status and treatment of chronic medical problems.   Diet: heart healthy/carb mod Physical activity: sedentary Depression/mood screen: negative Hearing: intact to whispered voice Visual acuity: grossly normal, performs annual eye exam  ADLs: capable Fall risk: none Home safety: good Cognitive evaluation: intact to orientation, naming, recall and repetition EOL planning: adv directives discussed  I have personally reviewed and have noted 1. The patient's medical and social history - reviewed today no changes 2. Their use of alcohol, tobacco or illicit drugs 3. Their current medications and supplements 4. The patient's functional ability including ADL's, fall risks, home safety risks and hearing or visual impairment. 5. Diet and physical activities 6. Evidence for depression or mood disorders 7. Care team reviewed and updated (available in snapshot)  Review of Systems  Constitutional: Negative for fever, chills, activity change, appetite change and fatigue.  HENT: Negative.   Eyes: Negative.   Respiratory: Negative for cough, chest tightness, shortness of breath and wheezing.   Cardiovascular: Negative for chest pain, palpitations and leg swelling.  Gastrointestinal: Negative for abdominal pain, diarrhea, constipation and abdominal distention.  Genitourinary: Negative.   Musculoskeletal: Positive for myalgias and arthralgias. Negative for back pain, joint swelling and gait problem.  Skin: Negative.   Neurological: Negative for dizziness, weakness, numbness and headaches.  Psychiatric/Behavioral: Negative.       Objective:   Physical Exam  Constitutional: He is oriented to person, place, and time. He appears well-developed and well-nourished.  Overweight  HENT:  Head: Normocephalic and atraumatic.  Eyes: EOM are  normal.  Neck: Normal range of motion.  Cardiovascular: Normal rate and regular rhythm.   No murmur heard. Pulmonary/Chest: Effort normal and breath sounds normal. No respiratory distress. He has no wheezes. He has no rales.  Abdominal: Soft. Bowel sounds are normal. He exhibits no distension. There is no tenderness. There is no rebound.  Musculoskeletal:  See foot exam  Neurological: He is alert and oriented to person, place, and time.  Skin: Skin is warm and dry.  Vitals reviewed.  Filed Vitals:   05/22/16 1535  BP: 180/90  Pulse: 67  Temp: 98.6 F (37 C)  TempSrc: Oral  Resp: 18  Height: 5\' 11"  (1.803 m)  Weight: 230 lb (104.327 kg)  SpO2: 97%      Assessment & Plan:  Prevnar 13 given at visit

## 2016-05-22 NOTE — Progress Notes (Signed)
Pre visit review using our clinic review tool, if applicable. No additional management support is needed unless otherwise documented below in the visit note. 

## 2016-05-23 ENCOUNTER — Encounter: Payer: Self-pay | Admitting: Internal Medicine

## 2016-05-23 LAB — HEPATITIS C ANTIBODY: HCV Ab: NEGATIVE

## 2016-05-23 NOTE — Assessment & Plan Note (Signed)
Checking lipid panel and adjust as needed. Taking lipitor 20 mg daily.  

## 2016-05-23 NOTE — Assessment & Plan Note (Signed)
He did not take any BP meds this morning. Prior visit he had taken with cardiology and at goal. Continue imdur, metoprolol, tribenzor. Checking CMP and adjust as needed. No symptoms from high pressure.

## 2016-05-23 NOTE — Assessment & Plan Note (Signed)
Referral to GI again for colonoscopy as he did not follow through last time. Given prevnar 13 to complete pneumonia series. Checking labs. Counseled about sun safety and mole surveillance. Given 10 year screening recommendations.

## 2016-05-23 NOTE — Assessment & Plan Note (Signed)
Not known to be complicated. Checking HgA1c and microalbumin to creatinine ratio. Has not had follow up in >1 year. Last Hga1c at goal. Taking metformin 500 mg BID and ACE-I. Adjust as needed. Foot exam done and reminded about the importance of eye exam as he has not had that done.

## 2016-05-31 ENCOUNTER — Encounter: Payer: Self-pay | Admitting: Family Medicine

## 2016-05-31 ENCOUNTER — Ambulatory Visit (INDEPENDENT_AMBULATORY_CARE_PROVIDER_SITE_OTHER)
Admission: RE | Admit: 2016-05-31 | Discharge: 2016-05-31 | Disposition: A | Discharge status: Home / Self Care | Payer: Medicare Other | Source: Ambulatory Visit | Attending: Family Medicine | Admitting: Family Medicine

## 2016-05-31 ENCOUNTER — Ambulatory Visit (INDEPENDENT_AMBULATORY_CARE_PROVIDER_SITE_OTHER): Payer: Medicare Other | Admitting: Family Medicine

## 2016-05-31 VITALS — BP 142/82 | HR 66 | Wt 235.0 lb

## 2016-05-31 DIAGNOSIS — M545 Low back pain, unspecified: Secondary | ICD-10-CM

## 2016-05-31 DIAGNOSIS — M25561 Pain in right knee: Secondary | ICD-10-CM | POA: Diagnosis not present

## 2016-05-31 DIAGNOSIS — M179 Osteoarthritis of knee, unspecified: Secondary | ICD-10-CM | POA: Insufficient documentation

## 2016-05-31 DIAGNOSIS — M25562 Pain in left knee: Secondary | ICD-10-CM

## 2016-05-31 DIAGNOSIS — M170 Bilateral primary osteoarthritis of knee: Secondary | ICD-10-CM

## 2016-05-31 DIAGNOSIS — M5136 Other intervertebral disc degeneration, lumbar region: Secondary | ICD-10-CM | POA: Diagnosis not present

## 2016-05-31 MED ORDER — TIZANIDINE HCL 4 MG PO TABS: 4.0000 mg | ORAL_TABLET | Freq: Every evening | ORAL | Status: DC

## 2016-05-31 NOTE — Assessment & Plan Note (Signed)
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 Patient likely does have osteophytic changes. We discussed home exercises, core strengthening, patient only has a brace. X-rays are pending. Muscle relaxer given to help him with his nighttime pain. We discussed if worsening symptoms he could be a candidate for gabapentin as well as formal physical therapy. Patient will follow-up again in 3-4 weeks.

## 2016-05-31 NOTE — Progress Notes (Signed)
[facility] [address] [place], [place] [address] Phone: [phone] Subjective:    I'm seeing this patient by the request  of:    CC: bilateral knee pain, low back pain  [id number] Adam Harris is a 70 y.o. male coming in with complaint of bilateral knee pain. Patient has had this intermittently for years. Seems to be worsening. Starting to have trouble going up and down stairs or hills. Seems to be more on the medial and anterior. Patient states that sometimes it feels like his legs are a little bit weak. Patient denies any significant radiation of the pain. Denies any numbness. Patient though states that making it more difficult to do activities of daily living he states such as chopping wood. Has not notice any swelling  Patient also complaining of back pain. Patient was moving some heavy objects. Patient felt a sharp pain on the right side. Has had chronic back pain for quite some time but this is significantly worse. Has been wearing a back brace which seems to be helpful. Mild radiation into the right buttocks. No weakness of the lower extremities. Some mild stiffness at night but nothing that keeps him up or wakes him up. States though if he makes quick movement she can have sharp stabbing pain. Has tried some over-the-counter medicines with very minimal benefit     Past Medical History  Diagnosis Date  . Hypertension   . Arthritis   . CAD (coronary artery disease)     Occluded RCA 12/2013  . Atrial fibrillation [facility])    Past Surgical History  Procedure Laterality Date  . Knee arthroscopy    . Left heart catheterization with coronary angiogram N/A 02/04/2014    Procedure: LEFT HEART CATHETERIZATION WITH CORONARY ANGIOGRAM;  Surgeon: [name], MD;  Location: [facility] CATH LAB;  Service: Cardiovascular;  Laterality: N/A;   Social History   Social History  . Marital Status: Married    Spouse Name: N/A  . Number of Children: N/A  . Years of  Education: N/A   Social History Main Topics  . Smoking status: Never Smoker   . Smokeless tobacco: None  . Alcohol Use: No  . Drug Use: None  . Sexual Activity: Not Asked   Other Topics Concern  . None   Social History Narrative   No Known Allergies Family History  Problem Relation Age of Onset  . Hypertension Mother     Past medical history, social, surgical and family history all reviewed in electronic medical record.  No pertanent information unless stated regarding to the chief complaint.   Review of Systems: No headache, visual changes, nausea, vomiting, diarrhea, constipation, dizziness, abdominal pain, skin rash, fevers, chills, night sweats, weight loss, swollen lymph nodes, body aches, joint swelling, muscle aches, chest pain, shortness of breath, mood changes.   Objective Blood pressure 142/82, pulse 66, weight 235 lb (106.595 kg).  General: No apparent distress alert and oriented x3 mood and affect normal, dressed appropriately.  HEENT: Pupils equal, extraocular movements intact  Respiratory: Patient's speak in full sentences and does not appear short of breath  Cardiovascular: No lower extremity edema, non tender, no erythema  Skin: Warm dry intact with no signs of infection or rash on extremities or on axial skeleton.  Abdomen: Soft nontender  Neuro: Cranial nerves II through XII are intact, neurovascularly intact in all extremities with 2+ DTRs and 2+ pulses.  Lymph: No lymphadenopathy of posterior or anterior cervical chain or axillae  bilaterally.  Gait normal with good balance and coordination.  MSK:  Non tender with full range of motion and good stability and symmetric strength and tone of shoulders, elbows, wrist, hip, and ankles bilaterally. Arthritic changes of multiple joints Knee: Osteophytic changes of the knees bilaterally. Mild valgus deformity bilaterally Tenderness to palpation over the medial joint line ROM full in flexion and extension and lower leg  rotation. Mild instability noted with valgus force bilaterally Mild painful patellar compression. Patellar glide with mildcrepitus. Patellar and quadriceps tendons unremarkable. Hamstring and quadriceps strength is normal.    Back Exam:  Inspection: Unremarkable  Motion: Flexion 35 deg, Extension 15 deg, Side Bending to 35 deg bilaterally,  Rotation to 35 deg bilaterally  SLR laying: Negative  XSLR laying: Negative  Palpable tenderness: tenderness to palpation in the paraspinal musculature on the right side of the lumbar spine overlying lipoma noted FABER: negative. Sensory change: Gross sensation intact to all lumbar and sacral dermatomes.  Reflexes: 2+ at both patellar tendons, 2+ at achilles tendons, Babinski's downgoing.  Strength at foot  Plantar-flexion: 5/5 Dorsi-flexion: 5/5 Eversion: 5/5 Inversion: 5/5  Leg strength  Quad: 5/5 Hamstring: 5/5 Hip flexor: 5/5 Hip abductors: 5/5  Gait unremarkable.  After informed written and verbal consent, patient was seated on exam table. Right knee was prepped with alcohol swab and utilizing anterolateral approach, patient's right knee space was injected with 4:1  marcaine 0.5%: Kenalog 40mg /dL. Patient tolerated the procedure well without immediate complications.  After informed written and verbal consent, patient was seated on exam table. Left knee was prepped with alcohol swab and utilizing anterolateral approach, patient's left knee space was injected with 4:1  marcaine 0.5%: Kenalog 40mg /dL. Patient tolerated the procedure well without immediate complications.   Impression and Recommendations:     This case required medical decision making of moderate complexity.      Note: This dictation was prepared with Dragon dictation along with smaller phrase technology. Any transcriptional errors that result from this process are unintentional.

## 2016-05-31 NOTE — Patient Instructions (Signed)
Good to see you Ice 20 minutes 2 times daily. Usually after activity and before bed. pennsaid pinkie amount topically 2 times daily as needed. Stop if your stomach hurts or you bruise easy  2 xrays downstairs today.  For the knees, Good shoes with rigid bottom.  Jalene Mullet, [place] or New balance greater then 700 For the back also take a muscle relaxer at night as needed for pain and can take during the day but may make you drowsy.  DO NOT drive on it.  Vitamin D 2000 IU dialy  Tart cherry extract any dose at night  See me again in 3-4 weeks to make sure you are doing all right.

## 2016-05-31 NOTE — Assessment & Plan Note (Signed)
Given bilateral injections. We'll avoid oral anti-inflammatories secondary to patient's coronary artery disease and renal disease. Patient given some topical anti-inflammatory to try but warned of potential side effects. We discussed icing regimen. Discussed over-the-counter medications a could be beneficial. We discussed proper shoes as well as orthotics. Patient and will come back and see me again in 3-4 weeks. X-rays are pending. Likely moderate to severe arthritis and he would be a candidate for viscous supplementation.

## 2016-06-08 NOTE — Progress Notes (Signed)
[facility] [address] [place], [place] [address] Phone: [phone] Subjective:     CC: bilateral knee pain, low back pain f/u  [id number]  Adam Harris is a 70 y.o. male coming in with complaint of bilateral knee pain.  Moderate OA on xray, independently visualized by me.  Given injections at last visit.  Was to do HEP, icing and proper shoes.  Patient states left knee better but the right knee gives him significant pain. Patient is having continued instability.    Patient also complaining of back pain.  States he is having more of a discomfort. Patient states that it seems to continue to catch from time to time. Patient  Xrays show  . 4 mm anterolisthesis of L4 on L5. 2. 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 3. Degenerative change of the facet joints of L4-5 and L5-S1. Given HEP, icing, muscle relaxer and topical NSAIDs Patient states continues to have some discomfort. Patient was little more guided somewhat to do. No significant pain going down the legs.    Past Medical History:  Diagnosis Date  . Arthritis   . Atrial fibrillation ([place])   . CAD (coronary artery disease)    Occluded RCA 12/2013  . Hypertension    Past Surgical History:  Procedure Laterality Date  . KNEE ARTHROSCOPY    . LEFT HEART CATHETERIZATION WITH CORONARY ANGIOGRAM N/A 02/04/2014   Procedure: LEFT HEART CATHETERIZATION WITH CORONARY ANGIOGRAM;  Surgeon: [name], MD;  Location: [facility] C[facility];  Service: Cardiovascular;  Laterality: N/A;   Social History   Social History  . Marital status: Married    Spouse name: N/A  . Number of children: N/A  . Years of education: N/A   Social History Main Topics  . Smoking status: Never Smoker  . Smokeless tobacco: None  . Alcohol use No  . Drug use: Unknown  . Sexual activity: Not Asked   Other Topics Concern  . None   Social History Narrative  . None   No Known Allergies Family History  Problem  Relation Age of Onset  . Hypertension Mother     Past medical history, social, surgical and family history all reviewed in electronic medical record.  No pertanent information unless stated regarding to the chief complaint.   Review of Systems: No headache, visual changes, nausea, vomiting, diarrhea, constipation, dizziness, abdominal pain, skin rash, fevers, chills, night sweats, weight loss, swollen lymph nodes, body aches, joint swelling, muscle aches, chest pain, shortness of breath, mood changes.   Objective  Blood pressure 132/80, pulse (!) 56, weight 229 lb (103.9 kg).  General: No apparent distress alert and oriented x3 mood and affect normal, dressed appropriately.  HEENT: Pupils equal, extraocular movements intact  Respiratory: Patient's speak in full sentences and does not appear short of breath  Cardiovascular: No lower extremity edema, non tender, no erythema  Skin: Warm dry intact with no signs of infection or rash on extremities or on axial skeleton.  Abdomen: Soft nontender  Neuro: Cranial nerves II through XII are intact, neurovascularly intact in all extremities with 2+ DTRs and 2+ pulses.  Lymph: No lymphadenopathy of posterior or anterior cervical chain or axillae bilaterally.  Gait normal with good balance and coordination.  MSK:  Non tender with full range of motion and good stability and symmetric strength and tone of shoulders, elbows, wrist, hip, and ankles bilaterally. Arthritic changes of multiple joints Knee:right Osteophytic changes of the knees bilaterally. Mild valgus  deformity bilaterally Tenderness to palpation over the medial joint line ROM full in flexion and extension and lower leg rotation. Mild instability noted with valgus force bilaterally Mild painful patellar compression. Patellar glide with mildcrepitus. Patellar and quadriceps tendons unremarkable. Hamstring and quadriceps strength is normal.  Contralateral side considerably better. Still some  mild tenderness   Back Exam:  Inspection: Unremarkable  Motion: Flexion 35 deg, Extension 15 deg, Side Bending to 35 deg bilaterally,  Rotation to 35 deg bilaterally  SLR laying: Negative  XSLR laying: Negative  Palpable tenderness: tenderness to palpation in the paraspinal musculature on the right side of the lumbar spine overlying lipoma noted FABER: negative. Sensory change: Gross sensation intact to all lumbar and sacral dermatomes.  Reflexes: 2+ at both patellar tendons, 2+ at achilles tendons, Babinski's downgoing.  Strength at foot  Plantar-flexion: 5/5 Dorsi-flexion: 5/5 Eversion: 5/5 Inversion: 5/5  Leg strength  Quad: 5/5 Hamstring: 5/5 Hip flexor: 5/5 Hip abductors: 5/5  Gait unremarkable.  After informed written and verbal consent, patient was seated on exam table. Right knee was prepped with alcohol swab and utilizing anterolateral approach, patient's right knee space was injected with 4:1  25 mg/1 mL of Monovisc(sodium hyaluronate) in a prefilled syringe was injected easily into the knee through a 22-gauge needle.. Patient tolerated the procedure well without immediate complications.     Impression and Recommendations:     This case required medical decision making of moderate complexity.      Note: This dictation was prepared with Dragon dictation along with smaller phrase technology. Any transcriptional errors that result from this process are unintentional.

## 2016-06-09 ENCOUNTER — Encounter: Payer: Self-pay | Admitting: Family Medicine

## 2016-06-09 ENCOUNTER — Ambulatory Visit (INDEPENDENT_AMBULATORY_CARE_PROVIDER_SITE_OTHER): Payer: Medicare Other | Admitting: Family Medicine

## 2016-06-09 VITALS — BP 132/80 | HR 56 | Wt 229.0 lb

## 2016-06-09 DIAGNOSIS — G8929 Other chronic pain: Secondary | ICD-10-CM

## 2016-06-09 DIAGNOSIS — M545 Low back pain, unspecified: Secondary | ICD-10-CM

## 2016-06-09 DIAGNOSIS — M170 Bilateral primary osteoarthritis of knee: Secondary | ICD-10-CM | POA: Diagnosis not present

## 2016-06-09 NOTE — Patient Instructions (Addendum)
Good to see you  Ice is your friend We did monvisc in your knee.  This hopefully will help for 6 months.  [place] will be calling you  Continue all the same medicines and lets hope the back keeps getting better See me again in 4 weeks.

## 2016-06-09 NOTE — Assessment & Plan Note (Signed)
Patient states that it continues to have some discomfort. Encouraged with the idea of formal physical therapy was referred today. We discussed icing regimen and given a trial of topical anti-inflammatory's. Muscle relaxers given. Patient wanted to hold off on the gabapentin at this moment. No radicular symptoms. We'll consider that in the future. Follow-up in 4 weeks.

## 2016-06-09 NOTE — Assessment & Plan Note (Signed)
Patient was given an injection today. Viscous supple mentation and the knee. Should do very well with conservative therapy. We discussed bracing. Patient rather do formal physical therapy. Patient will come back and see me again in 4 weeks to follow-up on his back and we'll discuss the knee as well.

## 2016-06-27 ENCOUNTER — Other Ambulatory Visit: Payer: Self-pay | Admitting: Internal Medicine

## 2016-06-30 ENCOUNTER — Encounter: Payer: Self-pay | Admitting: Internal Medicine

## 2016-07-04 ENCOUNTER — Other Ambulatory Visit: Payer: Self-pay | Admitting: Cardiology

## 2016-07-04 NOTE — Telephone Encounter (Signed)
Rx(s) sent to pharmacy electronically.  

## 2016-07-11 NOTE — Progress Notes (Deleted)
[facility] [address] [place], [place] [address] Phone: [phone] Subjective:     CC: bilateral knee pain, low back pain f/u  [id number]  Adam Harris is a 70 y.o. male coming in with complaint of bilateral knee pain.  Moderate OA on xray, independently visualized by me.  Given injections at last visit.  Was to do HEP, icing and proper shoes.  Patient states left knee better  Patient did have a viscous supple mentation and the right knee but continued to give him difficulty. Patient states    Patient also complaining of back pain.  States he is having more of a discomfort. Patient states that it seems to continue to catch from time to time. Patient  Xrays show  . 4 mm anterolisthesis of L4 on L5. 2. 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 3. Degenerative change of the facet joints of L4-5 and L5-S1. Given HEP, icing, muscle relaxer and topical NSAIDs Patient was going to formal physical therapy. Patient states    Past Medical History:  Diagnosis Date  . Arthritis   . Atrial fibrillation ([place])   . CAD (coronary artery disease)    Occluded RCA 12/2013  . Hypertension    Past Surgical History:  Procedure Laterality Date  . KNEE ARTHROSCOPY    . LEFT HEART CATHETERIZATION WITH CORONARY ANGIOGRAM N/A 02/04/2014   Procedure: LEFT HEART CATHETERIZATION WITH CORONARY ANGIOGRAM;  Surgeon: [name], MD;  Location: [facility] CATH LAB;  Service: Cardiovascular;  Laterality: N/A;   Social History   Social History  . Marital status: Married    Spouse name: N/A  . Number of children: N/A  . Years of education: N/A   Social History Main Topics  . Smoking status: Never Smoker  . Smokeless tobacco: Not on file  . Alcohol use No  . Drug use: Unknown  . Sexual activity: Not on file   Other Topics Concern  . Not on file   Social History Narrative  . No narrative on file   No Known Allergies Family History  Problem Relation Age  of Onset  . Hypertension Mother     Past medical history, social, surgical and family history all reviewed in electronic medical record.  No pertanent information unless stated regarding to the chief complaint.   Review of Systems: No headache, visual changes, nausea, vomiting, diarrhea, constipation, dizziness, abdominal pain, skin rash, fevers, chills, night sweats, weight loss, swollen lymph nodes, body aches, joint swelling, muscle aches, chest pain, shortness of breath, mood changes.   Objective  There were no vitals taken for this visit.  General: No apparent distress alert and oriented x3 mood and affect normal, dressed appropriately.  HEENT: Pupils equal, extraocular movements intact  Respiratory: Patient's speak in full sentences and does not appear short of breath  Cardiovascular: No lower extremity edema, non tender, no erythema  Skin: Warm dry intact with no signs of infection or rash on extremities or on axial skeleton.  Abdomen: Soft nontender  Neuro: Cranial nerves II through XII are intact, neurovascularly intact in all extremities with 2+ DTRs and 2+ pulses.  Lymph: No lymphadenopathy of posterior or anterior cervical chain or axillae bilaterally.  Gait normal with good balance and coordination.  MSK:  Non tender with full range of motion and good stability and symmetric strength and tone of shoulders, elbows, wrist, hip, and ankles bilaterally. Arthritic changes of multiple joints Knee:right Osteophytic changes of the knees bilaterally. Mild valgus deformity  bilaterally Tenderness to palpation over the medial joint line ROM full in flexion and extension and lower leg rotation. Mild instability noted with valgus force bilaterally Mild painful patellar compression. Patellar glide with mildcrepitus. Patellar and quadriceps tendons unremarkable. Hamstring and quadriceps strength is normal.  Contralateral side considerably better. Still some mild tenderness   Back Exam:    Inspection: Unremarkable  Motion: Flexion 35 deg, Extension 15 deg, Side Bending to 35 deg bilaterally,  Rotation to 35 deg bilaterally  SLR laying: Negative  XSLR laying: Negative  Palpable tenderness: tenderness to palpation in the paraspinal musculature on the right side of the lumbar spine overlying lipoma noted FABER: negative. Sensory change: Gross sensation intact to all lumbar and sacral dermatomes.  Reflexes: 2+ at both patellar tendons, 2+ at achilles tendons, Babinski's downgoing.  Strength at foot  Plantar-flexion: 5/5 Dorsi-flexion: 5/5 Eversion: 5/5 Inversion: 5/5  Leg strength  Quad: 5/5 Hamstring: 5/5 Hip flexor: 5/5 Hip abductors: 5/5  Gait unremarkable.  After informed written and verbal consent, patient was seated on exam table. Right knee was prepped with alcohol swab and utilizing anterolateral approach, patient's right knee space was injected with 4:1  25 mg/1 mL of Monovisc(sodium hyaluronate) in a prefilled syringe was injected easily into the knee through a 22-gauge needle.. Patient tolerated the procedure well without immediate complications.     Impression and Recommendations:     This case required medical decision making of moderate complexity.      Note: This dictation was prepared with Dragon dictation along with smaller phrase technology. Any transcriptional errors that result from this process are unintentional.

## 2016-07-12 ENCOUNTER — Ambulatory Visit: Payer: Medicare Other | Admitting: Family Medicine

## 2016-07-19 DIAGNOSIS — C61 Malignant neoplasm of prostate: Secondary | ICD-10-CM | POA: Diagnosis not present

## 2016-07-29 ENCOUNTER — Other Ambulatory Visit: Payer: Self-pay | Admitting: Cardiology

## 2016-08-04 ENCOUNTER — Other Ambulatory Visit: Payer: Self-pay | Admitting: Family Medicine

## 2016-08-07 NOTE — Telephone Encounter (Signed)
Refill done.  

## 2016-08-12 ENCOUNTER — Other Ambulatory Visit: Payer: Self-pay | Admitting: Cardiology

## 2016-08-14 NOTE — Telephone Encounter (Signed)
Rx request sent to pharmacy.  

## 2016-08-21 ENCOUNTER — Other Ambulatory Visit: Payer: Self-pay | Admitting: Cardiology

## 2016-08-22 ENCOUNTER — Other Ambulatory Visit: Payer: Self-pay

## 2016-08-22 MED ORDER — NITROGLYCERIN 0.4 MG SL SUBL: SUBLINGUAL_TABLET | SUBLINGUAL | 6 refills | Status: DC

## 2016-08-23 DIAGNOSIS — C61 Malignant neoplasm of prostate: Secondary | ICD-10-CM | POA: Diagnosis not present

## 2016-08-23 DIAGNOSIS — R351 Nocturia: Secondary | ICD-10-CM | POA: Diagnosis not present

## 2016-08-23 DIAGNOSIS — N401 Enlarged prostate with lower urinary tract symptoms: Secondary | ICD-10-CM | POA: Diagnosis not present

## 2016-08-28 ENCOUNTER — Encounter: Payer: Self-pay | Admitting: Cardiology

## 2016-08-28 NOTE — Progress Notes (Signed)
HPI The patient presents for follow up of CAD with a hospitalizatoin 3/24-3/26/15 with a non-STEMI. Cardiac catheterization demonstrated chronically occluded RCA and an aneurysmal circumflex with sluggish flow. Lifelong dual antiplatelet therapy was recommended.  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   He was subsequently found to be in atrial fib.  He did not really feel this.  He is now on ASA and Eliquis.  He returns for follow up.  He reports that he only gets chest pain if he eats birthday cake.  He otherwise hasn't had to use any nitroglycerin. He's not been taking is aspirin. He's been taking Motrin every day because of knee pain. He stopped taking his Lipitor. He denies any new shortness of breath, PND or orthopnea. He's had no new palpitations, presyncope or syncope.   No Known Allergies  Current Outpatient Prescriptions  Medication Sig Dispense Refill  . aspirin EC 81 MG EC tablet Take 1 tablet (81 mg total) by mouth daily.    Marland Kitchen atorvastatin (LIPITOR) 20 MG tablet Take 1 tablet (20 mg total) by mouth every evening. 30 tablet 11  . ELIQUIS 5 MG TABS tablet Take ONE tablet by mouth twice daily 60 tablet 6  . isosorbide mononitrate (IMDUR) 30 MG 24 hr tablet Take 1 tablet (30 mg total) by mouth daily. 60 tablet 0  . metFORMIN (GLUCOPHAGE) 500 MG tablet Take ONE (1) tablet by mouth twice daily with meals 60 tablet 3  . metoprolol tartrate (LOPRESSOR) 25 MG tablet Take 1 tablet (25 mg total) by mouth 2 (two) times daily. Please schedule appointment for refills. 60 tablet 0  . nitroGLYCERIN (NITROSTAT) 0.4 MG SL tablet Place 1 tablet under the tongue every 5 minutes times 3 doses as needed for chest pains, call 911 if 2nd dose does not help 25 tablet 6  . Olmesartan-Amlodipine-HCTZ (TRIBENZOR) 40-10-12.5 MG TABS Take 1 tablet by mouth daily. <PLEASE MAKE APPOINTMENT FOR REFILLS> 30 tablet 0  . tiZANidine (ZANAFLEX) 4 MG tablet Take 1 tablet (4 mg total) by mouth Nightly.  30 tablet 0   No current facility-administered medications for this visit.     Past Medical History:  Diagnosis Date  . Arthritis   . Atrial fibrillation ([place])   . CAD (coronary artery disease)    Occluded RCA 12/2013  . Hypertension     Past Surgical History:  Procedure Laterality Date  . KNEE ARTHROSCOPY    . LEFT HEART CATHETERIZATION WITH CORONARY ANGIOGRAM N/A 02/04/2014   Procedure: LEFT HEART CATHETERIZATION WITH CORONARY ANGIOGRAM;  Surgeon: [name], MD;  Location: [facility];  Service: Cardiovascular;  Laterality: N/A;    ROS:   As stated in the HPI and negative for all other systems.  PHYSICAL EXAM BP 104/72 (BP Location: Left Arm, Patient Position: Sitting, Cuff Size: Normal)   Pulse (!) 54   Ht 5\' 11"  (1.803 m)   Wt 231 lb (104.8 kg)   BMI 32.22 kg/m  GENERAL:  Well appearing NECK:  No jugular venous distention, waveform within normal limits, carotid upstroke brisk and symmetric, no bruits, no thyromegaly LUNGS:  Clear to auscultation bilaterally BACK:  No CVA tenderness CHEST:  Unremarkable HEART:  PMI not displaced or sustained,S1 and S2 within normal limits, no S3, no S4, no clicks, no rubs, no murmurs ABD:  Flat, positive bowel sounds normal in frequency in pitch, no bruits, no rebound, no guarding, no midline pulsatile mass, no hepatomegaly, no splenomegaly EXT:  2  plus pulses throughout, no edema, no cyanosis no clubbing  EKG:    Sinus rhythm, rate 55, axis within normal limits, intervals within normal limits,  he does have some mild lateral T-wave inversions unchanged from the previous.  08/30/2016  Lab Results  Component Value Date   CHOL 178 05/22/2016   TRIG 122.0 05/22/2016   HDL 37.40 (L) 05/22/2016   LDLCALC 116 (H) 05/22/2016   Lab Results  Component Value Date   HGBA1C 6.7 (H) 05/22/2016    ASSESSMENT AND PLAN  CAD:  The patient has atypical chest pain. He has an occluded RCA   Is done relatively well. He was doing well with  the combination aspirin and Eliquis and I would like to continue this. However, he's going to stop the Motrin and talk to his primary care provider about something else is knee pain.  He's going to stop eating birthday cake.  DYSLIPIDEMIA:   He is not at target with his LDL but hasn't been taking his Lipitor because he ran out. I'm going to re-prescribe this.  HTN:  The blood pressure is at target. No change in medications is indicated. We will continue with therapeutic lifestyle changes (TLC).  ATRIAL FIB:  He is in NSR today.  He is not noticing this rhythm.  He tolerates anticoagulation.  No change in therapy is indicated.     [name] has a CHA2DS2 - VASc score of 3 with a risk of stroke of 3.2%

## 2016-08-29 ENCOUNTER — Ambulatory Visit (INDEPENDENT_AMBULATORY_CARE_PROVIDER_SITE_OTHER): Payer: Medicare Other | Admitting: Cardiology

## 2016-08-29 ENCOUNTER — Encounter: Payer: Self-pay | Admitting: Cardiology

## 2016-08-29 VITALS — BP 104/72 | HR 54 | Ht 71.0 in | Wt 231.0 lb

## 2016-08-29 DIAGNOSIS — I2510 Atherosclerotic heart disease of native coronary artery without angina pectoris: Secondary | ICD-10-CM

## 2016-08-29 DIAGNOSIS — I10 Essential (primary) hypertension: Secondary | ICD-10-CM

## 2016-08-29 MED ORDER — ATORVASTATIN CALCIUM 20 MG PO TABS: 20.0000 mg | ORAL_TABLET | Freq: Every evening | ORAL | 11 refills | Status: DC

## 2016-08-29 NOTE — Patient Instructions (Signed)
Medication Instructions:  RESTART- Atorvastatin(Lipitor) 1 tablet daily  Labwork: None Ordered  Testing/Procedures: None Ordered  Follow-Up: Your physician wants you to follow-up in: 1 Year. You will receive a reminder letter in the mail two months in advance. If you don't receive a letter, please call our office to schedule the follow-up appointment.   Any Other Special Instructions Will Be Listed Below (If Applicable).   If you need a refill on your cardiac medications before your next appointment, please call your pharmacy.

## 2016-08-30 ENCOUNTER — Encounter: Payer: Self-pay | Admitting: Cardiology

## 2016-09-19 ENCOUNTER — Other Ambulatory Visit: Payer: Self-pay

## 2016-09-19 MED ORDER — OLMESARTAN-AMLODIPINE-HCTZ 40-10-12.5 MG PO TABS: 1.0000 | ORAL_TABLET | Freq: Every day | ORAL | 0 refills | Status: DC

## 2016-09-20 ENCOUNTER — Other Ambulatory Visit: Payer: Self-pay | Admitting: *Deleted

## 2016-10-10 ENCOUNTER — Other Ambulatory Visit: Payer: Self-pay | Admitting: Cardiology

## 2016-10-26 ENCOUNTER — Telehealth: Payer: Self-pay | Admitting: Cardiology

## 2016-10-26 MED ORDER — METOPROLOL TARTRATE 25 MG PO TABS: 25.0000 mg | ORAL_TABLET | Freq: Two times a day (BID) | ORAL | 10 refills | Status: DC

## 2016-10-26 MED ORDER — OLMESARTAN-AMLODIPINE-HCTZ 40-10-12.5 MG PO TABS: 1.0000 | ORAL_TABLET | Freq: Every day | ORAL | 10 refills | Status: DC

## 2016-10-26 NOTE — Telephone Encounter (Signed)
New message     *STAT* If patient is at the pharmacy, call can be transferred to refill team.   1. Which medications need to be refilled? (please list name of each medication and dose if known) metoprolol tartrate (LOPRESSOR) 25 MG Olmesartan-Amlodipine-HCTZ (TRIBENZOR) 40-10-12.5 MG TABS tablet 2. Which pharmacy/location (including street and city if local pharmacy) is medication to be sent to? [facility]   3. Do they need a 30 day or 90 day supply? 30 days

## 2016-10-26 NOTE — Telephone Encounter (Signed)
Rx(s) sent to pharmacy electronically.  

## 2016-11-13 DIAGNOSIS — I4819 Other persistent atrial fibrillation: Secondary | ICD-10-CM

## 2016-11-13 HISTORY — DX: Other persistent atrial fibrillation: I48.19

## 2016-11-15 ENCOUNTER — Telehealth: Payer: Self-pay | Admitting: Cardiology

## 2016-11-15 MED ORDER — APIXABAN 5 MG PO TABS: 5.0000 mg | ORAL_TABLET | Freq: Two times a day (BID) | ORAL | 5 refills | Status: DC

## 2016-11-15 NOTE — Telephone Encounter (Signed)
°*  STAT* If patient is at the pharmacy, call can be transferred to refill team.   1. Which medications need to be refilled? (please list name of each medication and dose if known) Eliquis- Please call today,pt is completely out of it 2. Which pharmacy/location (including street and city if local pharmacy) is medication to be sent to?[facility] [phone]  3. Do they need a 30 day or 90 day supply? 60 and refills

## 2016-11-15 NOTE — Telephone Encounter (Signed)
Spoke with pharmacy staff and provided verbal Rx for eliquis

## 2016-11-20 ENCOUNTER — Inpatient Hospital Stay (HOSPITAL_COMMUNITY)
Admission: EM | Admit: 2016-11-20 | Discharge: 2016-11-22 | DRG: 281 | Disposition: A | Discharge status: Home / Self Care | Payer: Medicare Other | Attending: Cardiology | Admitting: Cardiology

## 2016-11-20 ENCOUNTER — Encounter (HOSPITAL_COMMUNITY): Payer: Self-pay | Admitting: Emergency Medicine

## 2016-11-20 ENCOUNTER — Emergency Department (HOSPITAL_COMMUNITY): Payer: Medicare Other

## 2016-11-20 ENCOUNTER — Ambulatory Visit: Payer: Medicare Other | Admitting: Internal Medicine

## 2016-11-20 DIAGNOSIS — Z79899 Other long term (current) drug therapy: Secondary | ICD-10-CM | POA: Diagnosis not present

## 2016-11-20 DIAGNOSIS — Z7984 Long term (current) use of oral hypoglycemic drugs: Secondary | ICD-10-CM | POA: Diagnosis not present

## 2016-11-20 DIAGNOSIS — R001 Bradycardia, unspecified: Secondary | ICD-10-CM | POA: Diagnosis not present

## 2016-11-20 DIAGNOSIS — E1122 Type 2 diabetes mellitus with diabetic chronic kidney disease: Secondary | ICD-10-CM | POA: Diagnosis present

## 2016-11-20 DIAGNOSIS — N184 Chronic kidney disease, stage 4 (severe): Secondary | ICD-10-CM | POA: Diagnosis present

## 2016-11-20 DIAGNOSIS — E785 Hyperlipidemia, unspecified: Secondary | ICD-10-CM | POA: Diagnosis not present

## 2016-11-20 DIAGNOSIS — I4430 Unspecified atrioventricular block: Secondary | ICD-10-CM | POA: Diagnosis present

## 2016-11-20 DIAGNOSIS — I4891 Unspecified atrial fibrillation: Secondary | ICD-10-CM

## 2016-11-20 DIAGNOSIS — Z7901 Long term (current) use of anticoagulants: Secondary | ICD-10-CM | POA: Diagnosis not present

## 2016-11-20 DIAGNOSIS — I10 Essential (primary) hypertension: Secondary | ICD-10-CM | POA: Diagnosis present

## 2016-11-20 DIAGNOSIS — T45516A Underdosing of anticoagulants, initial encounter: Secondary | ICD-10-CM | POA: Diagnosis present

## 2016-11-20 DIAGNOSIS — Z7982 Long term (current) use of aspirin: Secondary | ICD-10-CM | POA: Diagnosis not present

## 2016-11-20 DIAGNOSIS — R000 Tachycardia, unspecified: Secondary | ICD-10-CM | POA: Diagnosis not present

## 2016-11-20 DIAGNOSIS — I4892 Unspecified atrial flutter: Secondary | ICD-10-CM

## 2016-11-20 DIAGNOSIS — I129 Hypertensive chronic kidney disease with stage 1 through stage 4 chronic kidney disease, or unspecified chronic kidney disease: Secondary | ICD-10-CM | POA: Diagnosis not present

## 2016-11-20 DIAGNOSIS — I25119 Atherosclerotic heart disease of native coronary artery with unspecified angina pectoris: Secondary | ICD-10-CM | POA: Diagnosis present

## 2016-11-20 DIAGNOSIS — I252 Old myocardial infarction: Secondary | ICD-10-CM

## 2016-11-20 DIAGNOSIS — R7303 Prediabetes: Secondary | ICD-10-CM

## 2016-11-20 DIAGNOSIS — I2510 Atherosclerotic heart disease of native coronary artery without angina pectoris: Secondary | ICD-10-CM | POA: Diagnosis present

## 2016-11-20 DIAGNOSIS — N1832 Chronic kidney disease, stage 3b: Secondary | ICD-10-CM | POA: Diagnosis present

## 2016-11-20 DIAGNOSIS — N183 Chronic kidney disease, stage 3 unspecified: Secondary | ICD-10-CM | POA: Diagnosis present

## 2016-11-20 DIAGNOSIS — R079 Chest pain, unspecified: Secondary | ICD-10-CM | POA: Diagnosis not present

## 2016-11-20 DIAGNOSIS — I480 Paroxysmal atrial fibrillation: Secondary | ICD-10-CM | POA: Diagnosis present

## 2016-11-20 DIAGNOSIS — I214 Non-ST elevation (NSTEMI) myocardial infarction: Principal | ICD-10-CM

## 2016-11-20 DIAGNOSIS — E119 Type 2 diabetes mellitus without complications: Secondary | ICD-10-CM

## 2016-11-20 DIAGNOSIS — Z8249 Family history of ischemic heart disease and other diseases of the circulatory system: Secondary | ICD-10-CM

## 2016-11-20 DIAGNOSIS — I25110 Atherosclerotic heart disease of native coronary artery with unstable angina pectoris: Secondary | ICD-10-CM | POA: Diagnosis present

## 2016-11-20 DIAGNOSIS — E1159 Type 2 diabetes mellitus with other circulatory complications: Secondary | ICD-10-CM

## 2016-11-20 DIAGNOSIS — I481 Persistent atrial fibrillation: Secondary | ICD-10-CM | POA: Diagnosis not present

## 2016-11-20 DIAGNOSIS — I25118 Atherosclerotic heart disease of native coronary artery with other forms of angina pectoris: Secondary | ICD-10-CM | POA: Diagnosis not present

## 2016-11-20 LAB — URINALYSIS, ROUTINE W REFLEX MICROSCOPIC
Bacteria, UA: NONE SEEN
Bilirubin Urine: NEGATIVE
GLUCOSE, UA: NEGATIVE mg/dL
Ketones, ur: NEGATIVE mg/dL
Leukocytes, UA: NEGATIVE
Nitrite: NEGATIVE
PH: 6 (ref 5.0–8.0)
Protein, ur: 30 mg/dL — AB
SPECIFIC GRAVITY, URINE: 1.024 (ref 1.005–1.030)
Squamous Epithelial / LPF: NONE SEEN

## 2016-11-20 LAB — CBC WITH DIFFERENTIAL/PLATELET
BASOS ABS: 0 10*3/uL (ref 0.0–0.1)
Basophils Relative: 0 %
Eosinophils Absolute: 0 10*3/uL (ref 0.0–0.7)
Eosinophils Relative: 0 %
HEMATOCRIT: 47 % (ref 39.0–52.0)
Hemoglobin: 16.2 g/dL (ref 13.0–17.0)
LYMPHS ABS: 2.1 10*3/uL (ref 0.7–4.0)
Lymphocytes Relative: 12 %
MCH: 30.1 pg (ref 26.0–34.0)
MCHC: 34.5 g/dL (ref 30.0–36.0)
MCV: 87.4 fL (ref 78.0–100.0)
MONOS PCT: 6 %
Monocytes Absolute: 1.1 10*3/uL — ABNORMAL HIGH (ref 0.1–1.0)
NEUTROS PCT: 82 %
Neutro Abs: 14.6 10*3/uL — ABNORMAL HIGH (ref 1.7–7.7)
PLATELETS: 257 10*3/uL (ref 150–400)
RBC: 5.38 MIL/uL (ref 4.22–5.81)
RDW: 13.9 % (ref 11.5–15.5)
WBC: 17.8 10*3/uL — AB (ref 4.0–10.5)

## 2016-11-20 LAB — APTT
APTT: 35 s (ref 24–36)
aPTT: 74 seconds — ABNORMAL HIGH (ref 24–36)

## 2016-11-20 LAB — BASIC METABOLIC PANEL
ANION GAP: 12 (ref 5–15)
BUN: 33 mg/dL — ABNORMAL HIGH (ref 6–20)
CO2: 23 mmol/L (ref 22–32)
Calcium: 9.6 mg/dL (ref 8.9–10.3)
Chloride: 105 mmol/L (ref 101–111)
Creatinine, Ser: 1.5 mg/dL — ABNORMAL HIGH (ref 0.61–1.24)
GFR, EST AFRICAN AMERICAN: 53 mL/min — AB (ref >60)
GFR, EST NON AFRICAN AMERICAN: 45 mL/min — AB (ref >60)
GLUCOSE: 176 mg/dL — AB (ref 65–99)
POTASSIUM: 3.7 mmol/L (ref 3.5–5.1)
Sodium: 140 mmol/L (ref 135–145)

## 2016-11-20 LAB — HEPARIN LEVEL (UNFRACTIONATED): Heparin Unfractionated: 1.58 IU/mL — ABNORMAL HIGH (ref 0.30–0.70)

## 2016-11-20 LAB — I-STAT TROPONIN, ED: TROPONIN I, POC: 0.23 ng/mL — AB (ref 0.00–0.08)

## 2016-11-20 LAB — TROPONIN I
Troponin I: 1.77 ng/mL (ref <0.03)
Troponin I: 2.53 ng/mL (ref <0.03)

## 2016-11-20 LAB — MAGNESIUM: Magnesium: 1.9 mg/dL (ref 1.7–2.4)

## 2016-11-20 LAB — TSH: TSH: 1.287 u[IU]/mL (ref 0.350–4.500)

## 2016-11-20 MED ORDER — ISOSORBIDE MONONITRATE ER 30 MG PO TB24
30.0000 mg | ORAL_TABLET | Freq: Every day | ORAL | Status: DC
  Administered 2016-11-20 – 2016-11-21 (×2): 30 mg via ORAL
  Filled 2016-11-20 (×2): qty 1

## 2016-11-20 MED ORDER — SODIUM CHLORIDE 0.9% FLUSH: 3.0000 mL | INTRAVENOUS | Status: DC | PRN

## 2016-11-20 MED ORDER — HEPARIN (PORCINE) IN NACL 100-0.45 UNIT/ML-% IJ SOLN
1300.0000 [IU]/h | INTRAMUSCULAR | Status: DC
  Administered 2016-11-20 – 2016-11-21 (×2): 1300 [IU]/h via INTRAVENOUS
  Filled 2016-11-20 (×2): qty 250

## 2016-11-20 MED ORDER — SODIUM CHLORIDE 0.9 % WEIGHT BASED INFUSION: 1.0000 mL/kg/h | INTRAVENOUS | Status: DC

## 2016-11-20 MED ORDER — DILTIAZEM HCL 100 MG IV SOLR
5.0000 mg/h | Freq: Once | INTRAVENOUS | Status: AC
  Administered 2016-11-20: 15 mg/h via INTRAVENOUS
  Filled 2016-11-20: qty 100

## 2016-11-20 MED ORDER — ASPIRIN 81 MG PO CHEW
81.0000 mg | CHEWABLE_TABLET | ORAL | Status: AC
  Administered 2016-11-21: 81 mg via ORAL
  Filled 2016-11-20: qty 1

## 2016-11-20 MED ORDER — ASPIRIN 81 MG PO CHEW
324.0000 mg | CHEWABLE_TABLET | Freq: Once | ORAL | Status: AC
  Administered 2016-11-20: 324 mg via ORAL
  Filled 2016-11-20: qty 4

## 2016-11-20 MED ORDER — DILTIAZEM LOAD VIA INFUSION
10.0000 mg | Freq: Once | INTRAVENOUS | Status: AC
  Administered 2016-11-20: 10 mg via INTRAVENOUS

## 2016-11-20 MED ORDER — SODIUM CHLORIDE 0.9 % IV SOLN: 250.0000 mL | INTRAVENOUS | Status: DC | PRN

## 2016-11-20 MED ORDER — SODIUM CHLORIDE 0.9 % WEIGHT BASED INFUSION
3.0000 mL/kg/h | INTRAVENOUS | Status: DC
  Administered 2016-11-21: 3 mL/kg/h via INTRAVENOUS

## 2016-11-20 MED ORDER — ATORVASTATIN CALCIUM 20 MG PO TABS
20.0000 mg | ORAL_TABLET | Freq: Every day | ORAL | Status: DC
  Administered 2016-11-20: 20 mg via ORAL
  Filled 2016-11-20: qty 1

## 2016-11-20 MED ORDER — NITROGLYCERIN 0.4 MG SL SUBL: 0.4000 mg | SUBLINGUAL_TABLET | SUBLINGUAL | Status: DC | PRN

## 2016-11-20 MED ORDER — DILTIAZEM LOAD VIA INFUSION
10.0000 mg | Freq: Once | INTRAVENOUS | Status: DC
  Filled 2016-11-20: qty 10

## 2016-11-20 MED ORDER — TRAZODONE HCL 50 MG PO TABS
50.0000 mg | ORAL_TABLET | Freq: Every evening | ORAL | Status: DC | PRN
  Administered 2016-11-20 – 2016-11-21 (×2): 50 mg via ORAL
  Filled 2016-11-20 (×2): qty 1

## 2016-11-20 MED ORDER — ASPIRIN EC 81 MG PO TBEC: 81.0000 mg | DELAYED_RELEASE_TABLET | Freq: Every day | ORAL | Status: DC

## 2016-11-20 MED ORDER — SODIUM CHLORIDE 0.9% FLUSH
3.0000 mL | Freq: Two times a day (BID) | INTRAVENOUS | Status: DC
  Administered 2016-11-20 – 2016-11-21 (×2): 3 mL via INTRAVENOUS

## 2016-11-20 MED ORDER — ATORVASTATIN CALCIUM 20 MG PO TABS: 20.0000 mg | ORAL_TABLET | Freq: Every evening | ORAL | Status: DC

## 2016-11-20 MED ORDER — METOPROLOL TARTRATE 25 MG PO TABS
25.0000 mg | ORAL_TABLET | Freq: Two times a day (BID) | ORAL | Status: DC
  Administered 2016-11-20 – 2016-11-21 (×3): 25 mg via ORAL
  Filled 2016-11-20 (×3): qty 1

## 2016-11-20 MED ORDER — DILTIAZEM HCL 100 MG IV SOLR
5.0000 mg/h | Freq: Once | INTRAVENOUS | Status: AC
  Administered 2016-11-20: 5 mg/h via INTRAVENOUS
  Filled 2016-11-20: qty 100

## 2016-11-20 NOTE — Progress Notes (Signed)
Pt HR noted to be dropping into 40s nonsustained but sustains in the 50s - pt still afib/aflutter. Dilt drip stopped per parameters. VSS, patient asymptomatic. [place], [place] with cardiology paged and made aware. To place hold parameters on metoprolol. Will continue to monitor patient closely.

## 2016-11-20 NOTE — ED Notes (Signed)
Attempted report 

## 2016-11-20 NOTE — H&P (Signed)
Patient ID: Adam Harris MRN: [id number], DOB/AGE: 1946/03/19   Admit date: 11/20/2016   Primary Physician: Hoyt Koch, MD Primary Cardiologist: Dr. Percival Spanish   Pt. Profile:  71 y/o male with h/o CAD and PAF, presenting to the ED with complaint of chest pain, found to be in atrial fibrillation/flutter w/ RVR.   Problem List  Past Medical History:  Diagnosis Date  . Arthritis   . Atrial fibrillation ([place])   . CAD (coronary artery disease)    Occluded RCA 12/2013  . Hypertension     Past Surgical History:  Procedure Laterality Date  . KNEE ARTHROSCOPY    . LEFT HEART CATHETERIZATION WITH CORONARY ANGIOGRAM N/A 02/04/2014   Procedure: LEFT HEART CATHETERIZATION WITH CORONARY ANGIOGRAM;  Surgeon: [name], MD;  Location: [facility];  Service: Cardiovascular;  Laterality: N/A;     Allergies  No Known Allergies  HPI  The patient is a 71 y/o male, followed by Dr. Percival Spanish, with a h/o CAD and atrial fibrillation. In March 2015, he presented with a NSTEMI. Cardiac catheterization demonstrated chronically occluded RCA with faint left-to-right collaterals and an aneurysmal circumflex with sluggish flow. Lifelong dual antiplatelet therapy was recommended.  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 He is on chronic anticoagulation with Eliquis for his PAF. Also takes ASA given his CAD.   It is documented in his last OV note from 08/2016 that "he only gets chest pain if he eats birthday cake". Yesterday, [birth year], was his 71th birthday. However he denies consumption of cake. He was in his usual state of health until he suddenly developed substernal chest tightness yesterday. Occurred at rest. It did not radiate. It was different from his angina he had with his MI in 2015, which was only left arm pain. He had no anterior CP in 2015. He denies any recent history of left arm pain. No dyspnea. He denies palpitations. He reports that he took 4 SL NTG tablets at home and his  systolic BP dropped into the 60s. He called EMS. By the time they arrived his BP had improved some and he started to feel better but still had CP. He was noted to be tachycardiac and brought to the ED. He was given adenosine and Cardizem and rate improved demonstrating atrial flutter with variable AV block and lateral ST depressions. His CP resolved. He remains in atrial flutter on telemetry with HR in the 70s. BP is stable. He denies any ETOH. No excess caffeine. No tobacco use. He admits that his diet is poor. He eats a high calorie, high fat diet. He admits to compliance issues with his meds. He often forgets to take the evening doses of his BID meds, including Eliquis. He has missed several doses within the past month. POC troponin is abnormal at 0.23 in the ED. His WBC is elevated at 17. CXR shows no focal airspace disease or consolidation in the lungs. However his lung exam is + for faint bibasilar crackles. He denies dyspnea and cough. He is afebrile. UA pending. BMP shows elevated BUN/SCr at 33/1.50. His baseline Scr 1.3-1.6.     Home Medications  Prior to Admission medications   Medication Sig Start Date End Date Taking? Authorizing Provider  apixaban (ELIQUIS) 5 MG TABS tablet Take 1 tablet (5 mg total) by mouth 2 (two) times daily. 11/15/16  Yes Minus Breeding, MD  aspirin EC 81 MG EC tablet Take 1 tablet (81 mg total) by mouth daily. 02/05/14  Yes Brett Canales, PA-C  atorvastatin (LIPITOR) 20 MG tablet Take 1 tablet (20 mg total) by mouth every evening. 08/29/16  Yes Minus Breeding, MD  isosorbide mononitrate (IMDUR) 30 MG 24 hr tablet TAKE 1 TABLET (30 MG TOTAL) BY MOUTH DAILY. 10/11/16  Yes Minus Breeding, MD  metFORMIN (GLUCOPHAGE) 500 MG tablet Take ONE (1) tablet by mouth twice daily with meals 06/27/16  Yes Hoyt Koch, MD  metoprolol tartrate (LOPRESSOR) 25 MG tablet Take 1 tablet (25 mg total) by mouth 2 (two) times daily. 10/26/16  Yes Minus Breeding, MD  nitroGLYCERIN  (NITROSTAT) 0.4 MG SL tablet Place 1 tablet under the tongue every 5 minutes times 3 doses as needed for chest pains, call 911 if 2nd dose does not help 08/22/16  Yes Minus Breeding, MD  Olmesartan-Amlodipine-HCTZ (TRIBENZOR) 40-10-12.5 MG TABS Take 1 tablet by mouth daily. 10/26/16  Yes Minus Breeding, MD    Family History  Family History  Problem Relation Age of Onset  . Hypertension Mother     Social History  Social History   Social History  . Marital status: Married    Spouse name: N/A  . Number of children: N/A  . Years of education: N/A   Occupational History  . Not on file.   Social History Main Topics  . Smoking status: Never Smoker  . Smokeless tobacco: Never Used  . Alcohol use No  . Drug use: No  . Sexual activity: Not on file   Other Topics Concern  . Not on file   Social History Narrative  . No narrative on file     Review of Systems General:  No chills, fever, night sweats or weight changes.  Cardiovascular:  + chest pain, - dyspnea on exertion, edema, orthopnea, palpitations, paroxysmal nocturnal dyspnea. Dermatological: No rash, lesions/masses Respiratory: No cough, dyspnea Urologic: No hematuria, dysuria Abdominal:   No nausea, vomiting, diarrhea, bright red blood per rectum, melena, or hematemesis Neurologic:  No visual changes, wkns, changes in mental status. All other systems reviewed and are otherwise negative except as noted above.  Physical Exam  Blood pressure 104/63, pulse 73, resp. rate 16, SpO2 93 %.  General: Pleasant, NAD Psych: Normal affect. Neuro: Alert and oriented X 3. Moves all extremities spontaneously. HEENT: Normal  Neck: Supple without bruits or JVD. Lungs:  Resp regular and unlabored, faint bibasilar crackles  Heart: RRR no s3, s4, or murmurs. Abdomen: Soft, non-tender, non-distended, BS + x 4.  Extremities: No clubbing, cyanosis or edema. DP/PT/Radials 2+ and equal bilaterally.  Labs  Troponin [facility]  Test)  Recent Labs  11/20/16 0510  TROPIPOC 0.23*   No results for input(s): CKTOTAL, CKMB, TROPONINI in the last 72 hours. Lab Results  Component Value Date   WBC 17.8 (H) 11/20/2016   HGB 16.2 11/20/2016   HCT 47.0 11/20/2016   MCV 87.4 11/20/2016   PLT 257 11/20/2016    Recent Labs Lab 11/20/16 0500  NA 140  K 3.7  CL 105  CO2 23  BUN 33*  CREATININE 1.50*  CALCIUM 9.6  GLUCOSE 176*   Lab Results  Component Value Date   CHOL 178 05/22/2016   HDL 37.40 (L) 05/22/2016   LDLCALC 116 (H) 05/22/2016   TRIG 122.0 05/22/2016   No results found for: DDIMER   Radiology/Studies  Dg Chest Portable 1 View  Result Date: 11/20/2016 CLINICAL DATA:  Chest pain and diaphoresis tonight. EXAM: PORTABLE CHEST 1 VIEW COMPARISON:  02/03/2014 FINDINGS: Normal heart  size and pulmonary vascularity. No focal airspace disease or consolidation in the lungs. No blunting of costophrenic angles. No pneumothorax. Mediastinal contours appear intact. Calcified and tortuous aorta. IMPRESSION: No active disease. Electronically Signed   By: Lucienne Capers M.D.   On: 11/20/2016 04:34    ECG  Atrial flutter with variable AV block, lateral ST depressions    ASSESSMENT AND PLAN  Active Problems:   Atrial fibrillation ([place])  1. CAD/ Chest Pain with Moderate-High Risk For Cardiac Etiology: known CAD with prior NSTEMI in 2015 with cath showing CTO of the RCA with faint left-to-right collaterals and an aneurysmal circumflex with sluggish flow, treated medically. He now has recurrent CP, c/w unstable angina. Initial POC troponin is abnormal at 0.23. ? If secondary to coronary ischemia from worsening coronary artery disease vs demand ischemia from rapid atrial fibrillation/ flutter. Continue to cycle cardiac enzymes to assess trend. We will plan for ischemic w/u to better assess. Repeat LHC vs NST, pending troponin trend. Continue medical therapy for CAD. Check 2D echo.   2. Atrial Flutter w/RVR: rate  improved with IV Cardizem. Continue to monitor on telemetry. Given possible need for cath, we will hold Eliquis and start IV heparin per pharmacy. He has had compliance issues with BID meds. We will need to consider change to once daily Xarelto to help improve compliance for stroke risk reduction. Continue to monitor on telemetry. If cardioversion is needed, he will require a TEE prior to r/o LA thrombus, given several missed doses of Eliqius within the past month.   - possible causes for his recurrent atrial arhythmia may be coronary ischemia vs possible infection (elevated WBC of 17). We will continue ischemic w/u. Repeat CBC in the am. Follow results of UA. Monitor vitals signs. Consider repeat CXR if further increase in WBC. He is afebrile and CXR is unremarkable, however faint bibasilar rales are noted on exam. K is WNL. We will check a Mg and TSH level. He denies caffeine and ETOH use. Check 2D echo to assess LVF and wall motion. Continue home metoprolol.   3. HLD: Lipid panel 7/17 showed an elevated LDL level at 116 mg/dL. Goal given CAD is < 70 mg/dL. Recheck FLP and increase to 40 mg nightly, if not at goal.     Signed, Lyda Jester, PA-C 11/20/2016, 9:47 AM   History and all data above reviewed.  Patient examined.  I agree with the findings as above. He reports chest pain similar to his previous complaints.  He then took at home two NTG and thinks his BP fell.  Cam into the ED with atrial flutter with RVR.  Troponins are on the rise.   The patient exam reveals DHR:CBULAGT (2:1)   ,  Lungs: Clear,  Abd: Positive bowel sounds, no rebound no guarding, Ext No edema  .  All available labs, radiology testing, previous records reviewed. Agree with documented assessment and plan. Continue IV Dilt for now.  IV Heparin. Convert to PO and perhaps Xareto at discharge (once daily).  NSTEMI:  Possibly secondary to rapid rate and CTO.  However, will need a cardiac cath.  The patient understands that risks  included but are not limited to stroke (1 in 1000), death (1 in 9), kidney failure [usually temporary] (1 in 500), bleeding (1 in 200), allergic reaction [possibly serious] (1 in 200).  The patient understands and agrees to proceed.   Plan for AM.      Minus Breeding  2:23 PM  11/20/2016

## 2016-11-20 NOTE — Progress Notes (Signed)
ANTICOAGULATION CONSULT NOTE - Follow Up Consult  Pharmacy Consult for Heparin Indication: chest pain/ACS and history of atrial fibrillation (Eliquis PTA)  No Known Allergies  Patient Measurements: Height: 5\' 11"  (180.3 cm) Weight: 226 lb 4.8 oz (102.6 kg) IBW/kg (Calculated) : 75.3 Heparin Dosing Weight: 96.7 kg  Vital Signs: Temp: 97.6 F (36.4 C) (01/08 1142) Temp Source: Oral (01/08 1142) BP: 106/61 (01/08 1031) Pulse Rate: 75 (01/08 1142)  Labs:  Recent Labs  11/20/16 0500 11/20/16 1121 11/20/16 1145 11/20/16 1615 11/20/16 2034  HGB 16.2  --   --   --   --   HCT 47.0  --   --   --   --   PLT 257  --   --   --   --   APTT  --   --  35  --  74*  HEPARINUNFRC  --   --  1.58*  --   --   CREATININE 1.50*  --   --   --   --   TROPONINI  --  1.77*  --  2.53*  --     Estimated Creatinine Clearance: 55.9 mL/min (by C-G formula based on SCr of 1.5 mg/dL (H)).   Medications:  Infusions:  . [START ON 11/21/2016] sodium chloride     Followed by  . [START ON 11/21/2016] sodium chloride    . heparin 1,300 Units/hr (11/20/16 1249)    Assessment: 71 year old male on Eliquis PTA for history of atrial fibrillation with new chest pain transitioning from Eliquis to IV heparin for pending cardiac cath.   Dosing on aPTT at this time due to inaccurate heparin levels with recent Eliquis. Will plan to transition to heparin levels once correlating with aPTTs.   APTT is therapeutic at 74 sec on 1300 units/hr.  No bleeding noted.   Goal of Therapy:  Heparin level 0.3-0.7 units/ml aPTT 66-102 seconds Monitor platelets by anticoagulation protocol: Yes   Plan:  Continue heparin at 1300 units/hr. Daily aPTT, heparin level and CBC.   Sloan Leiter, PharmD, BCPS Clinical Pharmacist Clinical phone 11/20/2016 from 2:30 to [place] [phone] After hours, call #[phone] 11/20/2016,9:17 PM

## 2016-11-20 NOTE — ED Triage Notes (Signed)
Patient arrives from home with complaint of sudden onset chest pain while laying down to sleep. History of MI. Patient took 3 SL NTG tabs at home via wife. Afterward patient became pale and diaphoretic. First responders noted SBP to be @ 60. By arrival of [facility], BP had recovered to SBP of 100. PTA HR was 150+. EMS gave 6 of adenosine and 10 of diltiazem PTA. Once HR slowed they were able to see that patient was in Afib. After reduction in rate, patient reported to EMS that his pain was improved.

## 2016-11-20 NOTE — ED Notes (Signed)
Nurse Shanon Brow is drawing the blood

## 2016-11-20 NOTE — ED Provider Notes (Addendum)
Adam Harris DEPT Provider Note   CSN: [id number] Arrival date & time: 11/20/16  1540  By signing my name below, I, Adam Harris, attest that this documentation has been prepared under the direction and in the presence of Merryl Hacker, MD . Electronically Signed: Higinio Harris, Scribe. 11/20/2016. 4:03 AM.  History   Chief Complaint Chief Complaint  Patient presents with  . Chest Pain   The history is provided by the patient. No language interpreter was used.   HPI Comments: Adam Harris is a 71 y.o. male with PMHx of CAD, A-Fib, MI and HTN, brought in by EMS to the Emergency Department complaining of sudden onset, gradually improving, central chest pain radiating into his right arm that began a few minutes PTA. Pt reports he was laying down in bed at the onset of his pain. He notes he took 3 SL NTG tablets at home with no relief but states he experienced symptoms of lightheadedness soon after. He denies any loss of consciousness. Per EMS, pt was given 6 adenosine and 10 diltiazem en route to the ED with moderate relief of his pain. He notes his chest pain has now resolved in the ED. He reports he experienced similar symptoms of chest pain 2 days ago while lying down in bed. Pt notes hx of MI in 2015 and states his current symptoms do not feel similar. He denies abdominal pain and leg swelling. He initially denied history of atrial fibrillation but chart review reveals history of paroxysmal atrial fibrillation. Patient is currently on a liquids. He states he takes it "most of the time." When asked when he last missed a dose, patient is unsure.   Per last clinic note by Dr. Percival Spanish: "CAD with a hospitalizatoin 3/24-3/26/15 with a non-STEMI. Cardiac catheterization demonstrated chronically occluded RCA and an aneurysmal circumflex with sluggish flow. Lifelong dual antiplatelet therapy was recommended.  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   He was subsequently found to be in atrial  fib.  He did not really feel this.  He is now on ASA and Eliquis."  Past Medical History:  Diagnosis Date  . Arthritis   . Atrial fibrillation ([place])   . CAD (coronary artery disease)    Occluded RCA 12/2013  . Hypertension     Patient Active Problem List   Diagnosis Date Noted  . Degenerative arthritis of knee, bilateral 05/31/2016  . Low back pain 05/31/2016  . Routine general medical examination at a health care facility 12/25/2014  . Exertional angina ([place]) 10/29/2014  . Atrial fibrillation, new onset ([place]) 10/29/2014  . Chronic renal disease, stage III 10/29/2014  . Coronary Artery Disease 03/09/2014  . Diabetes mellitus, type 2 ([place]) 02/17/2014  . Dyslipidemia 02/05/2014  . History of non-ST elevation myocardial infarction (NSTEMI) 02/03/2014  . Essential hypertension 02/03/2014  . Abnormal prostate specific antigen 04/19/2013    Past Surgical History:  Procedure Laterality Date  . KNEE ARTHROSCOPY    . LEFT HEART CATHETERIZATION WITH CORONARY ANGIOGRAM N/A 02/04/2014   Procedure: LEFT HEART CATHETERIZATION WITH CORONARY ANGIOGRAM;  Surgeon: [name], MD;  Location: [facility]ATH [facility];  Service: Cardiovascular;  Laterality: N/A;    Home Medications    Prior to Admission medications   Medication Sig Start Date End Date Taking? Authorizing Provider  apixaban (ELIQUIS) 5 MG TABS tablet Take 1 tablet (5 mg total) by mouth 2 (two) times daily. 11/15/16   Minus Breeding, MD  aspirin EC 81 MG EC tablet Take  1 tablet (81 mg total) by mouth daily. 02/05/14   Brett Canales, PA-C  atorvastatin (LIPITOR) 20 MG tablet Take 1 tablet (20 mg total) by mouth every evening. 08/29/16   Minus Breeding, MD  isosorbide mononitrate (IMDUR) 30 MG 24 hr tablet TAKE 1 TABLET (30 MG TOTAL) BY MOUTH DAILY. 10/11/16   Minus Breeding, MD  metFORMIN (GLUCOPHAGE) 500 MG tablet Take ONE (1) tablet by mouth twice daily with meals 06/27/16   Hoyt Koch, MD  metoprolol tartrate (LOPRESSOR) 25 MG  tablet Take 1 tablet (25 mg total) by mouth 2 (two) times daily. 10/26/16   Minus Breeding, MD  nitroGLYCERIN (NITROSTAT) 0.4 MG SL tablet Place 1 tablet under the tongue every 5 minutes times 3 doses as needed for chest pains, call 911 if 2nd dose does not help 08/22/16   Minus Breeding, MD  Olmesartan-Amlodipine-HCTZ [facility]) 40-10-12.5 MG TABS Take 1 tablet by mouth daily. 10/26/16   Minus Breeding, MD  tiZANidine (ZANAFLEX) 4 MG tablet Take 1 tablet (4 mg total) by mouth Nightly. 08/07/16   Lyndal Pulley, DO    Family History Family History  Problem Relation Age of Onset  . Hypertension Mother     Social History Social History  Substance Use Topics  . Smoking status: Never Smoker  . Smokeless tobacco: Never Used  . Alcohol use No     Allergies   Patient has no known allergies.   Review of Systems Review of Systems  Constitutional: Negative for fever.  Cardiovascular: Positive for chest pain. Negative for leg swelling.  Gastrointestinal: Negative for abdominal pain.  Neurological: Positive for light-headedness. Negative for syncope.  All other systems reviewed and are negative.  Physical Exam Updated Vital Signs BP 110/70   Pulse (!) 149   Resp 25   SpO2 96%   Physical Exam  Constitutional: He is oriented to person, place, and time. He appears well-developed and well-nourished. No distress.  HENT:  Head: Normocephalic and atraumatic.  Cardiovascular: Normal heart sounds.   No murmur heard. Tachycardia, irregular rhythm  Pulmonary/Chest: Effort normal and breath sounds normal. No respiratory distress. He has no wheezes.  Abdominal: Soft. Bowel sounds are normal. There is no tenderness. There is no rebound.  Musculoskeletal: He exhibits edema.  Neurological: He is alert and oriented to person, place, and time.  Skin: Skin is warm and dry.  Psychiatric: He has a normal mood and affect.  Nursing note and vitals reviewed.    ED Treatments / Results   Labs (all labs ordered are listed, but only abnormal results are displayed) Labs Reviewed  CBC WITH DIFFERENTIAL/PLATELET - Abnormal; Notable for the following:       Result Value   WBC 17.8 (*)    Neutro Abs 14.6 (*)    Monocytes Absolute 1.1 (*)    All other components within normal limits  BASIC METABOLIC PANEL - Abnormal; Notable for the following:    Glucose, Bld 176 (*)    BUN 33 (*)    Creatinine, Ser 1.50 (*)    GFR calc non Af Amer 45 (*)    GFR calc Af Amer 53 (*)    All other components within normal limits  I-STAT TROPOININ, ED - Abnormal; Notable for the following:    Troponin i, poc 0.23 (*)    All other components within normal limits  URINALYSIS, ROUTINE W REFLEX MICROSCOPIC    EKG  EKG Interpretation  Date/Time:  Monday November 20 2016 03:41:35 EST Ventricular Rate:  118 PR Interval:    QRS Duration: 123 QT Interval:  364 QTC Calculation: 423 R Axis:   60 Text Interpretation:  Atrial flutter with varied AV block, Nonspecific intraventricular conduction delay Repol abnrm suggests ischemia, diffuse leads elevation in AVR, depressions laterally Confirmed by Dina Rich  MD, COURTNEY ([id number]) on 11/20/2016 3:50:56 AM       Radiology Dg Chest Portable 1 View  Result Date: 11/20/2016 CLINICAL DATA:  Chest pain and diaphoresis tonight. EXAM: PORTABLE CHEST 1 VIEW COMPARISON:  02/03/2014 FINDINGS: Normal heart size and pulmonary vascularity. No focal airspace disease or consolidation in the lungs. No blunting of costophrenic angles. No pneumothorax. Mediastinal contours appear intact. Calcified and tortuous aorta. IMPRESSION: No active disease. Electronically Signed   By: Lucienne Capers M.D.   On: 11/20/2016 04:34    Procedures Procedures (including critical care time)  CRITICAL CARE Performed by: Merryl Hacker   Total critical care time: 25 minutes  Critical care time was exclusive of separately billable procedures and treating other patients.  Critical  care was necessary to treat or prevent imminent or life-threatening deterioration.  Critical care was time spent personally by me on the following activities: development of treatment Harris with patient and/or surrogate as well as nursing, discussions with consultants, evaluation of patient's response to treatment, examination of patient, obtaining history from patient or surrogate, ordering and performing treatments and interventions, ordering and review of laboratory studies, ordering and review of radiographic studies, pulse oximetry and re-evaluation of patient's condition.  Medications Ordered in ED Medications  diltiazem (CARDIZEM) 100 mg in dextrose 5 % 100 mL (1 mg/mL) infusion (5 mg/hr Intravenous New Bag/Given 11/20/16 0538)    DIAGNOSTIC STUDIES:  Oxygen Saturation is 96% on RA, normal by my interpretation.    COORDINATION OF CARE:  4:03 AM Discussed treatment Harris with pt at bedside and pt agreed to Harris.  Initial Impression / Assessment and Harris / ED Course  I have reviewed the triage vital signs and the nursing notes.  Pertinent labs & imaging results that were available during my care of the patient were reviewed by me and considered in my medical decision making (see chart for details).  Clinical Course     Patient presents in atrial fibrillation. Initially reported chest pain which resolved with rate control by EMS. EKG shows isolated elevation in aVR with depression laterally. Patient is currently asymptomatic. His rates now are back in the 140s. At baseline he is on metoprolol but responded well to diltiazem in route. He is placed on a diltiazem drip. Unclear how compliant he is with his request. May not be a candidate for cardioversion for this reason. Troponin elevated at 0.23. He was given aspirin. He remains without chest pain. Diltiazem drip is being actively titrated with minimal improvement of heart rate. Will consult cardiology for further management.  7:20  AM Patient now rate controlled on a diltiazem drip with a rate of 15. Awaiting cardiology consult.  Cardiology to admit.  I personally performed the services described in this documentation, which was scribed in my presence. The recorded information has been reviewed and is accurate.   Final Clinical Impressions(s) / ED Diagnoses   Final diagnoses:  Atrial fibrillation with RVR [facility])    New Prescriptions New Prescriptions   No medications on file     Merryl Hacker, MD 11/20/16 [place], MD 11/20/16 [phone]

## 2016-11-20 NOTE — Progress Notes (Signed)
ANTICOAGULATION CONSULT NOTE - Initial Consult  Pharmacy Consult for Heparin Indication: chest pain/ACS and hx atrial fibrillation (Eliquis PTA)  No Known Allergies  Patient Measurements: Height: 5\' 11"  (180.3 cm) Weight: 226 lb 4.8 oz (102.6 kg) IBW/kg (Calculated) : 75.3 Heparin Dosing Weight: 96.7 kg  Vital Signs: Temp: 97.9 F (36.6 C) (01/08 1031) Temp Source: Oral (01/08 1031) BP: 106/61 (01/08 1031) Pulse Rate: 84 (01/08 1031)  Labs:  Recent Labs  11/20/16 0500  HGB 16.2  HCT 47.0  PLT 257  CREATININE 1.50*    Estimated Creatinine Clearance: 55.9 mL/min (by C-G formula based on SCr of 1.5 mg/dL (H)).   Medical History: Past Medical History:  Diagnosis Date  . Arthritis   . Atrial fibrillation ([place])   . CAD (coronary artery disease)    Occluded RCA 12/2013  . Hypertension     Medications:  Prescriptions Prior to Admission  Medication Sig Dispense Refill Last Dose  . apixaban (ELIQUIS) 5 MG TABS tablet Take 1 tablet (5 mg total) by mouth 2 (two) times daily. 60 tablet 5 11/19/2016 at Unknown time  . aspirin EC 81 MG EC tablet Take 1 tablet (81 mg total) by mouth daily.   11/19/2016 at Unknown time  . atorvastatin (LIPITOR) 20 MG tablet Take 1 tablet (20 mg total) by mouth every evening. 30 tablet 11 11/19/2016 at Unknown time  . isosorbide mononitrate (IMDUR) 30 MG 24 hr tablet TAKE 1 TABLET (30 MG TOTAL) BY MOUTH DAILY. 60 tablet 5 11/19/2016 at Unknown time  . metFORMIN (GLUCOPHAGE) 500 MG tablet Take ONE (1) tablet by mouth twice daily with meals 60 tablet 3 11/19/2016 at Unknown time  . metoprolol tartrate (LOPRESSOR) 25 MG tablet Take 1 tablet (25 mg total) by mouth 2 (two) times daily. 60 tablet 10 11/19/2016 at 2030  . nitroGLYCERIN (NITROSTAT) 0.4 MG SL tablet Place 1 tablet under the tongue every 5 minutes times 3 doses as needed for chest pains, call 911 if 2nd dose does not help 25 tablet 6 11/20/2016 at Unknown time  . Olmesartan-Amlodipine-HCTZ (TRIBENZOR)  40-10-12.5 MG TABS Take 1 tablet by mouth daily. 30 tablet 10 11/19/2016 at Unknown time    Assessment: 71 yo M on Eliquis PTA for hx afib.  Pt admits to noncompliance with meds, especially missing the evening dose of his BID medications including Eliquis.  He presented to ED with CP.  To transition to heparin pending plans for cardiac cath.  Last Eliquis dose 1/7 AM.  Residual drug effects may influence heparin level monitoring.  Will obtain baseline labs to evaluate.  Goal of Therapy:  Heparin level 0.3-0.7 units/ml aPTT 66-102 seconds Monitor platelets by anticoagulation protocol: Yes   Plan:  Stat heparin level and aPTT to evaluate baseline Heparin infusion at 1300 units/hr. Depending on baseline labs, will order heparin level or aPTT for 8 hour follow-up. Daily heparin level (or aPTT) and CBC while on heparin  [employer], Pharm.D., BCPS Clinical Pharmacist Pager [phone] 11/20/2016 11:36 AM

## 2016-11-20 NOTE — Progress Notes (Signed)
CRITICAL VALUE ALERT  Critical value received:  Trop 1.77  Date of notification:  11/20/2016  Time of notification:  4315  Critical value read back:Yes.    Nurse who received alert:  Clint Lipps RN   MD notified (1st page):  Ellen Henri, [place]  Time of first page:  1235  MD notified (2nd page):  Time of second page:  Responding MD:  Ellen Henri, PA  Time MD responded:  [phone]

## 2016-11-21 ENCOUNTER — Encounter (HOSPITAL_COMMUNITY)
Admission: EM | Disposition: A | Discharge status: Home / Self Care | Payer: Self-pay | Source: Home / Self Care | Attending: Cardiology

## 2016-11-21 ENCOUNTER — Other Ambulatory Visit (HOSPITAL_COMMUNITY): Payer: Medicare Other

## 2016-11-21 DIAGNOSIS — I25118 Atherosclerotic heart disease of native coronary artery with other forms of angina pectoris: Secondary | ICD-10-CM

## 2016-11-21 DIAGNOSIS — I481 Persistent atrial fibrillation: Secondary | ICD-10-CM

## 2016-11-21 HISTORY — PX: CARDIAC CATHETERIZATION: SHX172

## 2016-11-21 LAB — BASIC METABOLIC PANEL
ANION GAP: 7 (ref 5–15)
BUN: 24 mg/dL — ABNORMAL HIGH (ref 6–20)
CALCIUM: 8.9 mg/dL (ref 8.9–10.3)
CO2: 26 mmol/L (ref 22–32)
Chloride: 106 mmol/L (ref 101–111)
Creatinine, Ser: 1.54 mg/dL — ABNORMAL HIGH (ref 0.61–1.24)
GFR, EST AFRICAN AMERICAN: 51 mL/min — AB (ref >60)
GFR, EST NON AFRICAN AMERICAN: 44 mL/min — AB (ref >60)
GLUCOSE: 154 mg/dL — AB (ref 65–99)
POTASSIUM: 3.8 mmol/L (ref 3.5–5.1)
Sodium: 139 mmol/L (ref 135–145)

## 2016-11-21 LAB — CBC
HEMATOCRIT: 39.8 % (ref 39.0–52.0)
HEMOGLOBIN: 13.1 g/dL (ref 13.0–17.0)
MCH: 28.9 pg (ref 26.0–34.0)
MCHC: 32.9 g/dL (ref 30.0–36.0)
MCV: 87.9 fL (ref 78.0–100.0)
Platelets: 196 10*3/uL (ref 150–400)
RBC: 4.53 MIL/uL (ref 4.22–5.81)
RDW: 13.9 % (ref 11.5–15.5)
WBC: 10.8 10*3/uL — ABNORMAL HIGH (ref 4.0–10.5)

## 2016-11-21 LAB — LIPID PANEL
CHOL/HDL RATIO: 4 ratio
CHOLESTEROL: 124 mg/dL (ref 0–200)
HDL: 31 mg/dL — AB (ref >40)
LDL Cholesterol: 74 mg/dL (ref 0–99)
Triglycerides: 97 mg/dL (ref <150)
VLDL: 19 mg/dL (ref 0–40)

## 2016-11-21 LAB — TROPONIN I: Troponin I: 2.35 ng/mL (ref <0.03)

## 2016-11-21 LAB — APTT: aPTT: 89 seconds — ABNORMAL HIGH (ref 24–36)

## 2016-11-21 LAB — PROTIME-INR
INR: 1.24
Prothrombin Time: 15.6 seconds — ABNORMAL HIGH (ref 11.4–15.2)

## 2016-11-21 LAB — HEPARIN LEVEL (UNFRACTIONATED): HEPARIN UNFRACTIONATED: 1.1 [IU]/mL — AB (ref 0.30–0.70)

## 2016-11-21 SURGERY — LEFT HEART CATH AND CORONARY ANGIOGRAPHY

## 2016-11-21 MED ORDER — ONDANSETRON HCL 4 MG/2ML IJ SOLN: 4.0000 mg | Freq: Four times a day (QID) | INTRAMUSCULAR | Status: DC | PRN

## 2016-11-21 MED ORDER — MIDAZOLAM HCL 2 MG/2ML IJ SOLN
INTRAMUSCULAR | Status: AC
  Filled 2016-11-21: qty 2

## 2016-11-21 MED ORDER — ATORVASTATIN CALCIUM 80 MG PO TABS
80.0000 mg | ORAL_TABLET | Freq: Every day | ORAL | Status: DC
  Administered 2016-11-21: 80 mg via ORAL
  Filled 2016-11-21: qty 1

## 2016-11-21 MED ORDER — ISOSORBIDE MONONITRATE ER 30 MG PO TB24
30.0000 mg | ORAL_TABLET | Freq: Every day | ORAL | Status: DC
  Administered 2016-11-22: 30 mg via ORAL
  Filled 2016-11-21: qty 1

## 2016-11-21 MED ORDER — FENTANYL CITRATE (PF) 100 MCG/2ML IJ SOLN
INTRAMUSCULAR | Status: DC | PRN
  Administered 2016-11-21: 25 ug via INTRAVENOUS

## 2016-11-21 MED ORDER — SODIUM CHLORIDE 0.9 % IV SOLN: 250.0000 mL | INTRAVENOUS | Status: DC | PRN

## 2016-11-21 MED ORDER — SODIUM CHLORIDE 0.9% FLUSH
3.0000 mL | Freq: Two times a day (BID) | INTRAVENOUS | Status: DC
  Administered 2016-11-22: 3 mL via INTRAVENOUS

## 2016-11-21 MED ORDER — ATORVASTATIN CALCIUM 40 MG PO TABS: 40.0000 mg | ORAL_TABLET | Freq: Every day | ORAL | Status: DC

## 2016-11-21 MED ORDER — HEPARIN SODIUM (PORCINE) 1000 UNIT/ML IJ SOLN
INTRAMUSCULAR | Status: DC | PRN
  Administered 2016-11-21: 5000 [IU] via INTRAVENOUS

## 2016-11-21 MED ORDER — HEPARIN SODIUM (PORCINE) 1000 UNIT/ML IJ SOLN
INTRAMUSCULAR | Status: AC
  Filled 2016-11-21: qty 1

## 2016-11-21 MED ORDER — VERAPAMIL HCL 2.5 MG/ML IV SOLN
INTRAVENOUS | Status: AC
  Filled 2016-11-21: qty 2

## 2016-11-21 MED ORDER — HEPARIN (PORCINE) IN NACL 100-0.45 UNIT/ML-% IJ SOLN
1300.0000 [IU]/h | INTRAMUSCULAR | Status: DC
  Administered 2016-11-21: 1300 [IU]/h via INTRAVENOUS
  Filled 2016-11-21: qty 250

## 2016-11-21 MED ORDER — LIDOCAINE HCL (PF) 1 % IJ SOLN
INTRAMUSCULAR | Status: AC
  Filled 2016-11-21: qty 30

## 2016-11-21 MED ORDER — HEPARIN (PORCINE) IN NACL 2-0.9 UNIT/ML-% IJ SOLN
INTRAMUSCULAR | Status: AC
  Filled 2016-11-21: qty 1000

## 2016-11-21 MED ORDER — METOPROLOL TARTRATE 25 MG PO TABS
37.5000 mg | ORAL_TABLET | Freq: Two times a day (BID) | ORAL | Status: DC
  Administered 2016-11-21: 37.5 mg via ORAL
  Filled 2016-11-21: qty 1

## 2016-11-21 MED ORDER — SODIUM CHLORIDE 0.9% FLUSH: 3.0000 mL | INTRAVENOUS | Status: DC | PRN

## 2016-11-21 MED ORDER — LIDOCAINE HCL (PF) 1 % IJ SOLN
INTRAMUSCULAR | Status: DC | PRN
  Administered 2016-11-21: 2 mL

## 2016-11-21 MED ORDER — IOPAMIDOL (ISOVUE-370) INJECTION 76%
INTRAVENOUS | Status: DC | PRN
  Administered 2016-11-21: 100 mL via INTRA_ARTERIAL

## 2016-11-21 MED ORDER — OFF THE BEAT BOOK
Freq: Once | Status: AC
  Administered 2016-11-21: 23:00:00
  Filled 2016-11-21: qty 1

## 2016-11-21 MED ORDER — VERAPAMIL HCL 2.5 MG/ML IV SOLN
INTRAVENOUS | Status: DC | PRN
  Administered 2016-11-21: 10 mL via INTRA_ARTERIAL

## 2016-11-21 MED ORDER — MIDAZOLAM HCL 2 MG/2ML IJ SOLN
INTRAMUSCULAR | Status: DC | PRN
  Administered 2016-11-21: 2 mg via INTRAVENOUS

## 2016-11-21 MED ORDER — ACETAMINOPHEN 325 MG PO TABS: 650.0000 mg | ORAL_TABLET | ORAL | Status: DC | PRN

## 2016-11-21 MED ORDER — FENTANYL CITRATE (PF) 100 MCG/2ML IJ SOLN
INTRAMUSCULAR | Status: AC
  Filled 2016-11-21: qty 2

## 2016-11-21 MED ORDER — SODIUM CHLORIDE 0.9 % IV SOLN: INTRAVENOUS | Status: DC

## 2016-11-21 MED ORDER — HEPARIN (PORCINE) IN NACL 2-0.9 UNIT/ML-% IJ SOLN
INTRAMUSCULAR | Status: DC | PRN
  Administered 2016-11-21: 1000 mL

## 2016-11-21 MED ORDER — IOPAMIDOL (ISOVUE-370) INJECTION 76%
INTRAVENOUS | Status: AC
  Filled 2016-11-21: qty 100

## 2016-11-21 MED ORDER — ASPIRIN 81 MG PO CHEW
81.0000 mg | CHEWABLE_TABLET | Freq: Every day | ORAL | Status: DC
  Administered 2016-11-22: 81 mg via ORAL
  Filled 2016-11-21: qty 1

## 2016-11-21 SURGICAL SUPPLY — 11 items
CATH EXPO 5FR FR4 (CATHETERS) ×2 IMPLANT
CATH OPTITORQUE JACKY 4.0 5F (CATHETERS) ×2 IMPLANT
CATH OPTITORQUE TIG 4.0 5F (CATHETERS) ×2 IMPLANT
DEVICE RAD COMP TR BAND LRG (VASCULAR PRODUCTS) ×2 IMPLANT
GLIDESHEATH SLEND SS 6F .021 (SHEATH) ×2 IMPLANT
GUIDEWIRE INQWIRE 1.5J.035X260 (WIRE) ×1 IMPLANT
INQWIRE 1.5J .035X260CM (WIRE) ×2
KIT HEART LEFT (KITS) ×2 IMPLANT
PACK CARDIAC CATHETERIZATION (CUSTOM PROCEDURE TRAY) ×2 IMPLANT
TRANSDUCER W/STOPCOCK (MISCELLANEOUS) ×2 IMPLANT
TUBING CIL FLEX 10 FLL-RA (TUBING) ×2 IMPLANT

## 2016-11-21 NOTE — Progress Notes (Deleted)
Note created in error.   Lyda Jester, PA-C

## 2016-11-21 NOTE — Interval H&P Note (Signed)
Cath Lab Visit (complete for each Cath Lab visit)  Clinical Evaluation Leading to the Procedure:   ACS: Yes.    Non-ACS:    Anginal Classification: CCS III  Anti-ischemic medical therapy: Maximal Therapy (2 or more classes of medications)  Non-Invasive Test Results: No non-invasive testing performed  Prior CABG: No previous CABG      History and Physical Interval Note:  11/21/2016 12:12 PM  Adam Harris  has presented today for surgery, with the diagnosis of n stemi  The various methods of treatment have been discussed with the patient and family. After consideration of risks, benefits and other options for treatment, the patient has consented to  Procedure(s): Left Heart Cath and Coronary Angiography (N/A) as a surgical intervention .  The patient's history has been reviewed, patient examined, no change in status, stable for surgery.  I have reviewed the patient's chart and labs.  Questions were answered to the patient's satisfaction.     Shelva Majestic

## 2016-11-21 NOTE — H&P (View-Only) (Signed)
Patient Name: Adam Harris Date of Encounter: 11/21/2016  Primary Cardiologist: Dr. [facility] Problem List     Active Problems:   Essential hypertension   NSTEMI (non-ST elevated myocardial infarction) ([place])   Dyslipidemia   CAD (coronary artery disease)   Diabetes mellitus, type 2 (HCC)   Chronic renal disease, stage III   Atrial fibrillation [facility])    Patient Profile     71 y/o male with h/o CAD and PAF, presenting to the ED with complaint of chest pain, found to be in atrial fibrillation/flutter w/ RVR and troponin elevation c/w NSTEMI.    Subjective   Doing well this am. No complaints. He denies any current CP. No dyspnea.   Inpatient Medications    Scheduled Meds: . aspirin EC  81 mg Oral Daily  . atorvastatin  20 mg Oral QHS  . isosorbide mononitrate  30 mg Oral Daily  . metoprolol tartrate  25 mg Oral BID  . sodium chloride flush  3 mL Intravenous Q12H   Continuous Infusions: . sodium chloride 1 mL/kg/hr (11/21/16 0736)  . heparin 1,300 Units/hr (11/20/16 1249)   PRN Meds: sodium chloride, nitroGLYCERIN, sodium chloride flush, traZODone   Vital Signs    Vitals:   11/20/16 2131 11/20/16 2151 11/21/16 0032 11/21/16 0429  BP: 108/66 119/67 109/74 126/90  Pulse: 61 64  73  Resp:      Temp: 97.6 F (36.4 C)  98.2 F (36.8 C)   TempSrc: Oral  Oral   SpO2: 96%  95% 97%  Weight:      Height:        Intake/Output Summary (Last 24 hours) at 11/21/16 0758 Last data filed at 11/21/16 0700  Gross per 24 hour  Intake          1143.67 ml  Output              850 ml  Net           293.67 ml   Filed Weights   11/20/16 1031  Weight: 226 lb 4.8 oz (102.6 kg)    Physical Exam   GEN: Well nourished, well developed, in no acute distress.  HEENT: Grossly normal.  Neck: Supple, no JVD, carotid bruits, or masses. Cardiac: irregular rhythm, regular rate, no murmurs, rubs, or gallops. No clubbing, cyanosis, edema.  Radials/DP/PT 2+ and equal bilaterally.   Respiratory:  Respirations regular and unlabored, clear to auscultation bilaterally. GI: Soft, nontender, nondistended, BS + x 4. MS: no deformity or atrophy. Skin: warm and dry, no rash. Neuro:  Strength and sensation are intact. Psych: AAOx3.  Normal affect.  Labs    CBC  Recent Labs  11/20/16 0500 11/21/16 0551  WBC 17.8* 10.8*  NEUTROABS 14.6*  --   HGB 16.2 13.1  HCT 47.0 39.8  MCV 87.4 87.9  PLT 257 229   Basic Metabolic Panel  Recent Labs  11/20/16 0500 11/20/16 1121 11/21/16 0551  NA 140  --  139  K 3.7  --  3.8  CL 105  --  106  CO2 23  --  26  GLUCOSE 176*  --  154*  BUN 33*  --  24*  CREATININE 1.50*  --  1.54*  CALCIUM 9.6  --  8.9  MG  --  1.9  --    Liver Function Tests No results for input(s): AST, ALT, ALKPHOS, BILITOT, PROT, ALBUMIN in the last 72 hours. No results for input(s): LIPASE, AMYLASE in the last  72 hours. Cardiac Enzymes  Recent Labs  11/20/16 1121 11/20/16 1615 11/20/16 2313  TROPONINI 1.77* 2.53* 2.35*   BNP Invalid input(s): POCBNP D-Dimer No results for input(s): DDIMER in the last 72 hours. Hemoglobin A1C No results for input(s): HGBA1C in the last 72 hours. Fasting Lipid Panel  Recent Labs  11/21/16 0551  CHOL 124  HDL 31*  LDLCALC 74  TRIG 97  CHOLHDL 4.0   Thyroid Function Tests  Recent Labs  11/20/16 1121  TSH 1.287    Telemetry    Atrial flutter w/ CVR - Personally Reviewed  Radiology    Dg Chest Portable 1 View  Result Date: 11/20/2016 CLINICAL DATA:  Chest pain and diaphoresis tonight. EXAM: PORTABLE CHEST 1 VIEW COMPARISON:  02/03/2014 FINDINGS: Normal heart size and pulmonary vascularity. No focal airspace disease or consolidation in the lungs. No blunting of costophrenic angles. No pneumothorax. Mediastinal contours appear intact. Calcified and tortuous aorta. IMPRESSION: No active disease. Electronically Signed   By: Lucienne Capers M.D.   On: 11/20/2016 04:34    Cardiac Studies   2D  Echo- pending   LHC- pending. Scheduled 11/21/16  Patient Profile     71 y/o male with h/o CAD and PAF, presenting to the ED with complaint of chest pain, found to be in atrial fibrillation/flutter w/ RVR and troponin elevation c/w NSTEMI.   Assessment & Plan    Active Problems:   Essential hypertension   NSTEMI (non-ST elevated myocardial infarction) ([place])   Dyslipidemia   CAD (coronary artery disease)   Diabetes mellitus, type 2 (HCC)   Chronic renal disease, stage III   Atrial fibrillation ([place])   1. CAD/ NSTEMI: Troponin peaked at 2.53. He is on IV heparin with plans for LHC this am with Dr. Claiborne Billings. Prior cath in 2015 showed chronically occluded RCA with faint left-to-right collaterals and an aneurysmal circumflex with sluggish flow. He is currently CP free on heparin. Continue ASA, Lipitor and metoprolol.   [name] has a CHA2DS2 - VASc score of 3 with a risk of stroke of 3.2%.   2. Atrial Flutter: HR controlled in the 80s currently. IV Cardizem discontinued yesterday due to bradycardia with HR in the 40s. Home BB, 25 mg of metoprolol BID ordered with hold parameters. Continue to monitor on telemetry. Eliquis is on hold for cath today. He is on IV heparin. We will consider transition to once daily Xarelto to help improve compliance. Pt admits to difficulties remembering to take evening dose of Eliquis at home. 2D echo pending. TSH, Mg and K all WNL. UA negative for UTI. CXR unremarkable. WBC improving from 17>>10.8   3. DLD: fasting Lipid panel shows LDL of 74 mg/dL. Given known CAD and new NSTEMI, will increase Lipitor from 20- 40 mg nightly for further LDL reduction.   4. Chronic Renal Insufficiency: SCr is 1.54 today. He is receiving pre cath IV hydration, 0.9% NaCl infusion at 100 mL/hr. His home metformin and ARB currently on hold. Will continue to hydrate post cath. F/u BMP in the am.   5. DM: Metformin on hold for cath. Fasting BG was 154. Treat with SSI.   6. HTN: BP is  currently well controlled. ARB currently on hold for cath. Monitor pressures.   Signed, Lyda Jester, PA-C  11/21/2016, 7:58 AM   History and all data above reviewed.  Patient examined.  I agree with the findings as above.   No further chest pain.  No SOB.  The patient exam  reveals COR  Irregular  ,  Lungs: Clear  ,  Abd: Positive bowel sounds, no rebound no guarding, Ext No edema  .  All available labs, radiology testing, previous records reviewed. Agree with documented assessment and plan. Atrial flutter:  Rate OK.  Start Xarelto after the case.  CKD.  Limit dye with cath.   Minus Breeding  8:39 AM  11/21/2016

## 2016-11-21 NOTE — Plan of Care (Signed)
Problem: Education: Goal: Understanding of medication regimen will improve Outcome: Progressing RN provided medication education to patient on all medications administered thus far this shift.  Patient stated understanding.

## 2016-11-21 NOTE — Progress Notes (Signed)
ANTICOAGULATION CONSULT NOTE - Follow Up Consult  Pharmacy Consult for Heparin Indication: chest pain/ACS and history of atrial fibrillation (Eliquis PTA)  No Known Allergies  Patient Measurements: Height: 5\' 11"  (180.3 cm) Weight: 226 lb 4.8 oz (102.6 kg) IBW/kg (Calculated) : 75.3 Heparin Dosing Weight: 96.7 kg  Vital Signs: Temp: 98 F (36.7 C) (01/09 0757) Temp Source: Oral (01/09 0757) BP: 119/85 (01/09 1435) Pulse Rate: 47 (01/09 1435)  Labs:  Recent Labs  11/20/16 0500 11/20/16 1121 11/20/16 1145 11/20/16 1615 11/20/16 2034 11/20/16 2313 11/21/16 0551  HGB 16.2  --   --   --   --   --  13.1  HCT 47.0  --   --   --   --   --  39.8  PLT 257  --   --   --   --   --  196  APTT  --   --  35  --  74*  --  89*  LABPROT  --   --   --   --   --   --  15.6*  INR  --   --   --   --   --   --  1.24  HEPARINUNFRC  --   --  1.58*  --   --   --  1.10*  CREATININE 1.50*  --   --   --   --   --  1.54*  TROPONINI  --  1.77*  --  2.53*  --  2.35*  --     Estimated Creatinine Clearance: 54.4 mL/min (by C-G formula based on SCr of 1.54 mg/dL (H)).   Medications:  Infusions:  . sodium chloride    . heparin Stopped (11/21/16 1144)    Assessment: 71 year old male on Eliquis PTA for history of atrial fibrillation with new chest pain transitioning from Eliquis to IV heparin for cardiac cath.  Pt is now s/p cath with plans order to restart heparin 8 hours after sheath pull.  Ultimate plans for medical management of CAD and likely restart oral anticoagulation 1/10.  Dosing based on aPTT at this time due to inaccurate heparin levels with recent Eliquis. Will plan to transition to heparin levels once correlating with aPTTs.   APTT was therapeutic on 1300 units/hr prior to cath.  No bleeding noted.  Sheath removed and TR band placed at 1315.  Heparin to start at 2115.  Goal of Therapy:  Heparin level 0.3-0.7 units/ml aPTT 66-102 seconds Monitor platelets by anticoagulation  protocol: Yes   Plan:  At 2115, restart heparin at 1300 units/hr. Daily aPTT, heparin level and CBC.  Follow-up plans to restart oral anticoagulation.  [employer], Pharm.D., BCPS Clinical Pharmacist Pager [phone] 11/21/2016 4:36 PM

## 2016-11-21 NOTE — Progress Notes (Addendum)
Patient Name: Adam Harris Date of Encounter: 11/21/2016  Primary Cardiologist: Dr. [facility] Problem List     Active Problems:   Essential hypertension   NSTEMI (non-ST elevated myocardial infarction) ([place])   Dyslipidemia   CAD (coronary artery disease)   Diabetes mellitus, type 2 (HCC)   Chronic renal disease, stage III   Atrial fibrillation [facility])    Patient Profile     71 y/o male with h/o CAD and PAF, presenting to the ED with complaint of chest pain, found to be in atrial fibrillation/flutter w/ RVR and troponin elevation c/w NSTEMI.    Subjective   Doing well this am. No complaints. He denies any current CP. No dyspnea.   Inpatient Medications    Scheduled Meds: . aspirin EC  81 mg Oral Daily  . atorvastatin  20 mg Oral QHS  . isosorbide mononitrate  30 mg Oral Daily  . metoprolol tartrate  25 mg Oral BID  . sodium chloride flush  3 mL Intravenous Q12H   Continuous Infusions: . sodium chloride 1 mL/kg/hr (11/21/16 0736)  . heparin 1,300 Units/hr (11/20/16 1249)   PRN Meds: sodium chloride, nitroGLYCERIN, sodium chloride flush, traZODone   Vital Signs    Vitals:   11/20/16 2131 11/20/16 2151 11/21/16 0032 11/21/16 0429  BP: 108/66 119/67 109/74 126/90  Pulse: 61 64  73  Resp:      Temp: 97.6 F (36.4 C)  98.2 F (36.8 C)   TempSrc: Oral  Oral   SpO2: 96%  95% 97%  Weight:      Height:        Intake/Output Summary (Last 24 hours) at 11/21/16 0758 Last data filed at 11/21/16 0700  Gross per 24 hour  Intake          1143.67 ml  Output              850 ml  Net           293.67 ml   Filed Weights   11/20/16 1031  Weight: 226 lb 4.8 oz (102.6 kg)    Physical Exam   GEN: Well nourished, well developed, in no acute distress.  HEENT: Grossly normal.  Neck: Supple, no JVD, carotid bruits, or masses. Cardiac: irregular rhythm, regular rate, no murmurs, rubs, or gallops. No clubbing, cyanosis, edema.  Radials/DP/PT 2+ and equal bilaterally.   Respiratory:  Respirations regular and unlabored, clear to auscultation bilaterally. GI: Soft, nontender, nondistended, BS + x 4. MS: no deformity or atrophy. Skin: warm and dry, no rash. Neuro:  Strength and sensation are intact. Psych: AAOx3.  Normal affect.  Labs    CBC  Recent Labs  11/20/16 0500 11/21/16 0551  WBC 17.8* 10.8*  NEUTROABS 14.6*  --   HGB 16.2 13.1  HCT 47.0 39.8  MCV 87.4 87.9  PLT 257 703   Basic Metabolic Panel  Recent Labs  11/20/16 0500 11/20/16 1121 11/21/16 0551  NA 140  --  139  K 3.7  --  3.8  CL 105  --  106  CO2 23  --  26  GLUCOSE 176*  --  154*  BUN 33*  --  24*  CREATININE 1.50*  --  1.54*  CALCIUM 9.6  --  8.9  MG  --  1.9  --    Liver Function Tests No results for input(s): AST, ALT, ALKPHOS, BILITOT, PROT, ALBUMIN in the last 72 hours. No results for input(s): LIPASE, AMYLASE in the last  72 hours. Cardiac Enzymes  Recent Labs  11/20/16 1121 11/20/16 1615 11/20/16 2313  TROPONINI 1.77* 2.53* 2.35*   BNP Invalid input(s): POCBNP D-Dimer No results for input(s): DDIMER in the last 72 hours. Hemoglobin A1C No results for input(s): HGBA1C in the last 72 hours. Fasting Lipid Panel  Recent Labs  11/21/16 0551  CHOL 124  HDL 31*  LDLCALC 74  TRIG 97  CHOLHDL 4.0   Thyroid Function Tests  Recent Labs  11/20/16 1121  TSH 1.287    Telemetry    Atrial flutter w/ CVR - Personally Reviewed  Radiology    Dg Chest Portable 1 View  Result Date: 11/20/2016 CLINICAL DATA:  Chest pain and diaphoresis tonight. EXAM: PORTABLE CHEST 1 VIEW COMPARISON:  02/03/2014 FINDINGS: Normal heart size and pulmonary vascularity. No focal airspace disease or consolidation in the lungs. No blunting of costophrenic angles. No pneumothorax. Mediastinal contours appear intact. Calcified and tortuous aorta. IMPRESSION: No active disease. Electronically Signed   By: Lucienne Capers M.D.   On: 11/20/2016 04:34    Cardiac Studies   2D  Echo- pending   LHC- pending. Scheduled 11/21/16  Patient Profile     71 y/o male with h/o CAD and PAF, presenting to the ED with complaint of chest pain, found to be in atrial fibrillation/flutter w/ RVR and troponin elevation c/w NSTEMI.   Assessment & Plan    Active Problems:   Essential hypertension   NSTEMI (non-ST elevated myocardial infarction) ([place])   Dyslipidemia   CAD (coronary artery disease)   Diabetes mellitus, type 2 (HCC)   Chronic renal disease, stage III   Atrial fibrillation ([place])   1. CAD/ NSTEMI: Troponin peaked at 2.53. He is on IV heparin with plans for LHC this am with Dr. Claiborne Billings. Prior cath in 2015 showed chronically occluded RCA with faint left-to-right collaterals and an aneurysmal circumflex with sluggish flow. He is currently CP free on heparin. Continue ASA, Lipitor and metoprolol.   Mr. Stanislav Gervase has a CHA2DS2 - VASc score of 3 with a risk of stroke of 3.2%.   2. Atrial Flutter: HR controlled in the 80s currently. IV Cardizem discontinued yesterday due to bradycardia with HR in the 40s. Home BB, 25 mg of metoprolol BID ordered with hold parameters. Continue to monitor on telemetry. Eliquis is on hold for cath today. He is on IV heparin. We will consider transition to once daily Xarelto to help improve compliance. Pt admits to difficulties remembering to take evening dose of Eliquis at home. 2D echo pending. TSH, Mg and K all WNL. UA negative for UTI. CXR unremarkable. WBC improving from 17>>10.8   3. DLD: fasting Lipid panel shows LDL of 74 mg/dL. Given known CAD and new NSTEMI, will increase Lipitor from 20- 40 mg nightly for further LDL reduction.   4. Chronic Renal Insufficiency: SCr is 1.54 today. He is receiving pre cath IV hydration, 0.9% NaCl infusion at 100 mL/hr. His home metformin and ARB currently on hold. Will continue to hydrate post cath. F/u BMP in the am.   5. DM: Metformin on hold for cath. Fasting BG was 154. Treat with SSI.   6. HTN: BP is  currently well controlled. ARB currently on hold for cath. Monitor pressures.   Signed, Lyda Jester, PA-C  11/21/2016, 7:58 AM   History and all data above reviewed.  Patient examined.  I agree with the findings as above.   No further chest pain.  No SOB.  The patient exam  reveals COR  Irregular  ,  Lungs: Clear  ,  Abd: Positive bowel sounds, no rebound no guarding, Ext No edema  .  All available labs, radiology testing, previous records reviewed. Agree with documented assessment and plan. Atrial flutter:  Rate OK.  Start Xarelto after the case.  CKD.  Limit dye with cath.   Minus Breeding  8:39 AM  11/21/2016

## 2016-11-21 NOTE — Care Management Note (Addendum)
Case Management Note  Patient Details  Name: Ruslan Mccabe MRN: [id number] Date of Birth: Dec 19, 1945  Subjective/Objective: Pt presented for Atrial Fib. CM received referral for Xarelto. Benefits Check completed and cost will be: S/W EZRA @ [facility] # [phone]   XARELTO 20 MG PO DAILY   COVER- YES  CO-PAY- $ 47.00  TIER- 1 DRUG  PRIOR APPROVAL- NO  PHARMACY : [facility]          Action/Plan: Pt is from home with wife. 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 Per pt he still drives. CM did provide pt with 30 day free Xarelto Card. Pt uses [facility] and medication is available. No further needs identified by CM at this time.   Expected Discharge Date:                  Expected Discharge Plan:  Home/Self Care  In-House Referral:  NA  Discharge planning Services  CM Consult, Medication Assistance  Post Acute Care Choice:  NA Choice offered to:  NA  DME Arranged:  N/A DME Agency:  NA  HH Arranged:  NA HH Agency:  NA  Status of Service:  Completed, signed off  If discussed at [place] of Stay Meetings, dates discussed:    Additional Comments:  Bethena Roys, RN 11/21/2016, 3:03 PM

## 2016-11-22 ENCOUNTER — Encounter (HOSPITAL_COMMUNITY): Payer: Self-pay | Admitting: Cardiovascular Disease

## 2016-11-22 ENCOUNTER — Inpatient Hospital Stay (HOSPITAL_COMMUNITY): Payer: Medicare Other

## 2016-11-22 DIAGNOSIS — I4891 Unspecified atrial fibrillation: Secondary | ICD-10-CM

## 2016-11-22 LAB — BASIC METABOLIC PANEL
ANION GAP: 6 (ref 5–15)
BUN: 17 mg/dL (ref 6–20)
CO2: 24 mmol/L (ref 22–32)
Calcium: 8.5 mg/dL — ABNORMAL LOW (ref 8.9–10.3)
Chloride: 111 mmol/L (ref 101–111)
Creatinine, Ser: 1.29 mg/dL — ABNORMAL HIGH (ref 0.61–1.24)
GFR calc Af Amer: >60 mL/min (ref >60)
GFR, EST NON AFRICAN AMERICAN: 55 mL/min — AB (ref >60)
GLUCOSE: 149 mg/dL — AB (ref 65–99)
POTASSIUM: 3.9 mmol/L (ref 3.5–5.1)
Sodium: 141 mmol/L (ref 135–145)

## 2016-11-22 LAB — ECHOCARDIOGRAM COMPLETE
CHL CUP TV REG PEAK VELOCITY: 293 cm/s
FS: 37 % (ref 28–44)
HEIGHTINCHES: 71 in
IVS/LV PW RATIO, ED: 0.82
LA ID, A-P, ES: 45 mm
LA vol A4C: 65.5 ml
LA vol index: 35.3 mL/m2
LADIAMINDEX: 2.01 cm/m2
LAVOL: 79.1 mL
LEFT ATRIUM END SYS DIAM: 45 mm
LV PW d: 11.7 mm — AB (ref 0.6–1.1)
LVOT area: 3.14 cm2
LVOT diameter: 20 mm
RV sys press: 37 mmHg
TRMAXVEL: 293 cm/s
Weight: 3688 oz

## 2016-11-22 LAB — HEPATIC FUNCTION PANEL
ALK PHOS: 57 U/L (ref 38–126)
ALT: 20 U/L (ref 17–63)
AST: 17 U/L (ref 15–41)
Albumin: 3.1 g/dL — ABNORMAL LOW (ref 3.5–5.0)
BILIRUBIN DIRECT: 0.2 mg/dL (ref 0.1–0.5)
BILIRUBIN TOTAL: 1.3 mg/dL — AB (ref 0.3–1.2)
Indirect Bilirubin: 1.1 mg/dL — ABNORMAL HIGH (ref 0.3–0.9)
Total Protein: 5.5 g/dL — ABNORMAL LOW (ref 6.5–8.1)

## 2016-11-22 LAB — CBC
HCT: 37.6 % — ABNORMAL LOW (ref 39.0–52.0)
HEMOGLOBIN: 12.6 g/dL — AB (ref 13.0–17.0)
MCH: 29 pg (ref 26.0–34.0)
MCHC: 33.5 g/dL (ref 30.0–36.0)
MCV: 86.4 fL (ref 78.0–100.0)
Platelets: 182 10*3/uL (ref 150–400)
RBC: 4.35 MIL/uL (ref 4.22–5.81)
RDW: 13.4 % (ref 11.5–15.5)
WBC: 8.8 10*3/uL (ref 4.0–10.5)

## 2016-11-22 LAB — HEPARIN LEVEL (UNFRACTIONATED): HEPARIN UNFRACTIONATED: 0.43 [IU]/mL (ref 0.30–0.70)

## 2016-11-22 LAB — APTT: aPTT: 69 seconds — ABNORMAL HIGH (ref 24–36)

## 2016-11-22 MED ORDER — METOPROLOL SUCCINATE ER 50 MG PO TB24
75.0000 mg | ORAL_TABLET | Freq: Every day | ORAL | Status: DC
  Administered 2016-11-22: 75 mg via ORAL
  Filled 2016-11-22: qty 1

## 2016-11-22 MED ORDER — NITROGLYCERIN 0.4 MG SL SUBL: SUBLINGUAL_TABLET | SUBLINGUAL | 6 refills | Status: DC

## 2016-11-22 MED ORDER — ISOSORBIDE MONONITRATE ER 30 MG PO TB24: 30.0000 mg | ORAL_TABLET | Freq: Every day | ORAL | 11 refills | Status: DC

## 2016-11-22 MED ORDER — METOPROLOL SUCCINATE ER 25 MG PO TB24: 75.0000 mg | ORAL_TABLET | Freq: Every day | ORAL | 11 refills | Status: DC

## 2016-11-22 MED ORDER — RIVAROXABAN 20 MG PO TABS: 20.0000 mg | ORAL_TABLET | Freq: Every day | ORAL | 11 refills | Status: DC

## 2016-11-22 MED ORDER — ATORVASTATIN CALCIUM 80 MG PO TABS: 80.0000 mg | ORAL_TABLET | Freq: Every evening | ORAL | 11 refills | Status: DC

## 2016-11-22 MED ORDER — RIVAROXABAN 20 MG PO TABS
20.0000 mg | ORAL_TABLET | Freq: Every day | ORAL | Status: DC
  Administered 2016-11-22: 20 mg via ORAL
  Filled 2016-11-22: qty 1

## 2016-11-22 MED ORDER — RIVAROXABAN 20 MG PO TABS: 20.0000 mg | ORAL_TABLET | Freq: Every day | ORAL | 0 refills | Status: DC

## 2016-11-22 NOTE — Progress Notes (Signed)
Nutrition Education Note  RD consulted for nutrition education regarding a Heart Healthy diet.   Lipid Panel     Component Value Date/Time   CHOL 124 11/21/2016 0551   TRIG 97 11/21/2016 0551   HDL 31 (L) 11/21/2016 0551   CHOLHDL 4.0 11/21/2016 0551   VLDL 19 11/21/2016 0551   LDLCALC 74 11/21/2016 0551    RD provided "Heart Healthy Nutrition Therapy" handout from the Academy of [employer]. Reviewed patient's dietary recall. Provided examples on ways to decrease sodium and fat intake in diet. Discouraged intake of processed foods and use of salt shaker. Encouraged fresh fruits and vegetables as well as whole grain sources of carbohydrates to maximize fiber intake. Teach back method used.  Expect good compliance.  Body mass index is 32.15 kg/m. Pt meets criteria for obesity based on current BMI.  Current diet order is heart healthy. Labs and medications reviewed. No further nutrition interventions warranted at this time. Patient being discharged today. RD contact information provided. If additional nutrition issues arise, please re-consult RD.  Molli Barrows, RD, LDN, [place] Pager [phone] After Hours Pager [phone]

## 2016-11-22 NOTE — Discharge Summary (Signed)
Discharge Summary    Patient ID: Adam Harris,  MRN: [id number], DOB/AGE: 1946-11-08 71 y.o.  Admit date: 11/20/2016 Discharge date: 11/22/2016  Primary Care Provider: Hoyt Koch Primary Cardiologist: Dr Percival Spanish  Discharge Diagnoses    Principal Problem:   NSTEMI (non-ST elevated myocardial infarction) [facility]) Active Problems:   Essential hypertension   Dyslipidemia   Coronary artery disease   Diabetes mellitus, type 2 (HCC)   Chronic renal disease, stage III   Atrial fibrillation with RVR (HCC)   Allergies No Known Allergies  Diagnostic Studies/Procedures    11/21/2008 CATH  Prox RCA lesion, 100 %stenosed.  Ost LAD lesion, 45 %stenosed.  3rd Mrg lesion, 80 %stenosed.  Mid Cx to Dist Cx lesion, 100 %stenosed.  Lat 3rd Mrg lesion, 50 %stenosed. Multivessel CAD: The LAD had ostial 40 to less than 50% focal stenosis. There were mild luminal irregularities in the LAD, which extended to the apex but otherwise was without additional significant obstructive disease.    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 The OM1 vessel had 80% stenosis before giving rise to a branch and then had 50% stenosis in the inferior limb. The AV groove circumflex was 99/100% occluded immediately after the large aneurysmal segment and there was faint filling of a very large an additional aneurysmal segment with thrombus and very faint filling of the distal posterolateral branches. There was faint collateralization of the distal posterolateral branches via the inferior branch of the obtuse marginal vessel.       The RCA was totally occluded proximally as had been previously demonstrated and there were faint bridging collaterals supplying the mid RCA which was a nondominant vessel distally.    LVEDP 18 mm  Hg. RECOMMENDATION: Angiographic findings were reviewed with Dr. Tamala Julian. 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 His RCA was totally occluded at that time. His RCA is essentially unchanged. His AV groove circumflex is now occluded, which most likely is the culprit lesion. The patient had been inadequately been taking his anticoagulation. Heparin will be resumed tonight with plans to resume oral anticoagulation. Initially, the patient will be treated with increasing medical therapy. He is completely pain-free. He will be started on nitrates, in addition to increased beta blocker therapy. He is not a candidate for stenting due to the significant aneurysmal dilatations throughout his vessels. If he develops increasing symptomatology, consider possible PTCA and/or evaluation for surgical revascularization. _____________   History of Present Illness     71 y/o male with h/o CAD and PAF, presenting to the ED with complaint of chest pain, found to be in atrial fibrillation/flutter w/ RVR and troponin elevation c/w NSTEMI.   Hospital Course     Consultants: Cardiac rehab   Mr [name] was treated with IV Cardizem and his HR improved. Eliquis held due to possible cath and pt placed on heparin. He had leukocytosis, but his CXR and UA were ok, no fever. His WBCs trended down and normalized before discharge.  His enzymes elevated, indicating a NSTEMI. He was taken to the cath lab on 01/09, results are above. He is to be treated medically, and is on appropriate meds. He is on ASA, BB, high-dose statin and Imdur. On 01/10, he was seen  by cardiac rehab and ambulated without any chest pain or SOB.   A lipid profile was checked, he is not at goal, so will continue high-dose statin. LFTs are pending at the time of discharge.  He is a borderline diabetic. He is encouraged to eat a diabetic diet. His  Tribenzor was held on admission, to allow HR control meds to be increased. Continue to hold this at discharge, his BP was well-controlled.  He remained in atrial fib. He developed bradycardia on the Cardizem, it was d/c'd. He had been on BB pta, it was uptitrated, with improvement in his HR. Compliance with bid meds is poor, all meds were changed to qd, he is compliant with am rx.   On 01/10, he was seen by Dr Percival Spanish and all data were reviewed. He did well with ambulation and his HR control had improved. No further inpatient workup was indicated and he is considered stable for discharge, to follow up as an outpatient.  _____________  Discharge Vitals Blood pressure 118/81, pulse 74, temperature 98.4 F (36.9 C), temperature source Oral, resp. rate 17, height 5\' 11"  (1.803 m), weight 230 lb 8 oz (104.6 kg), SpO2 99 %.  Filed Weights   11/20/16 1031 11/22/16 0333  Weight: 226 lb 4.8 oz (102.6 kg) 230 lb 8 oz (104.6 kg)    Labs & Radiologic Studies    CBC  Recent Labs  11/20/16 0500 11/21/16 0551 11/22/16 0637  WBC 17.8* 10.8* 8.8  NEUTROABS 14.6*  --   --   HGB 16.2 13.1 12.6*  HCT 47.0 39.8 37.6*  MCV 87.4 87.9 86.4  PLT 257 196 505   Basic Metabolic Panel  Recent Labs  11/20/16 1121 11/21/16 0551 11/22/16 0637  NA  --  139 141  K  --  3.8 3.9  CL  --  106 111  CO2  --  26 24  GLUCOSE  --  154* 149*  BUN  --  24* 17  CREATININE  --  1.54* 1.29*  CALCIUM  --  8.9 8.5*  MG 1.9  --   --    Liver Function Tests ordered  Cardiac Enzymes  Recent Labs  11/20/16 1121 11/20/16 1615 11/20/16 2313  TROPONINI 1.77* 2.53* 2.35*   Fasting Lipid Panel  Recent Labs  11/21/16 0551  CHOL 124  HDL 31*  LDLCALC 74  TRIG 97  CHOLHDL 4.0   Thyroid Function Tests  Recent Labs  11/20/16 1121  TSH 1.287   Lab Results  Component Value Date   HGBA1C 6.7 (H) 05/22/2016   _____________  Dg Chest Portable 1 View  Result Date: 11/20/2016 CLINICAL DATA:  Chest  pain and diaphoresis tonight. EXAM: PORTABLE CHEST 1 VIEW COMPARISON:  02/03/2014 FINDINGS: Normal heart size and pulmonary vascularity. No focal airspace disease or consolidation in the lungs. No blunting of costophrenic angles. No pneumothorax. Mediastinal contours appear intact. Calcified and tortuous aorta. IMPRESSION: No active disease. Electronically Signed   By: Lucienne Capers M.D.   On: 11/20/2016 04:34   Disposition   Pt is being discharged home today in good condition.  Follow-up Plans & Appointments    Follow-up Information    Minus Breeding, MD Follow up.   Specialty:  Cardiology Why:  The office will call. Contact information: [place] [id number] [phone]          Discharge Instructions    Amb Referral to Cardiac Rehabilitation    Complete by:  As directed  Diagnosis:  NSTEMI   Diet - low sodium heart healthy    Complete by:  As directed    Diet Carb Modified    Complete by:  As directed    Increase activity slowly    Complete by:  As directed       Discharge Medications   Current Discharge Medication List    START taking these medications   Details  metoprolol succinate (TOPROL-XL) 25 MG 24 hr tablet Take 3 tablets (75 mg total) by mouth daily. Qty: 90 tablet, Refills: 11    rivaroxaban (XARELTO) 20 MG TABS tablet Take 1 tablet (20 mg total) by mouth daily with breakfast. Qty: 30 tablet, Refills: 11      CONTINUE these medications which have CHANGED   Details  atorvastatin (LIPITOR) 80 MG tablet Take 1 tablet (80 mg total) by mouth every evening. Qty: 30 tablet, Refills: 11    isosorbide mononitrate (IMDUR) 30 MG 24 hr tablet Take 1 tablet (30 mg total) by mouth daily. Qty: 60 tablet, Refills: 11    nitroGLYCERIN (NITROSTAT) 0.4 MG SL tablet Place 1 tablet under the tongue every 5 minutes times 3 doses as needed for chest pains, call 911 if 2nd dose does not help Qty: 25 tablet, Refills: 6      CONTINUE  these medications which have NOT CHANGED   Details  aspirin EC 81 MG EC tablet Take 1 tablet (81 mg total) by mouth daily.    metFORMIN (GLUCOPHAGE) 500 MG tablet Take ONE (1) tablet by mouth twice daily with meals Qty: 60 tablet, Refills: 3      STOP taking these medications     apixaban (ELIQUIS) 5 MG TABS tablet      metoprolol tartrate (LOPRESSOR) 25 MG tablet      Olmesartan-Amlodipine-HCTZ (TRIBENZOR) 40-10-12.5 MG TABS          Aspirin prescribed at discharge?  Yes High Intensity Statin Prescribed? (Lipitor 40-80mg  or Crestor 20-40mg ): Yes Beta Blocker Prescribed? Yes For EF <40%, was ACEI/ARB Prescribed? No: n/a ADP Receptor Inhibitor Prescribed? (i.e. Plavix etc.-Includes Medically Managed Patients): Yes For EF <40%, Aldosterone Inhibitor Prescribed? No: n/a Was EF assessed during THIS hospitalization? Yes Was Cardiac Rehab II ordered? (Included Medically managed Patients): Yes   Outstanding Labs/Studies   Hepatic function panel  Duration of Discharge Encounter   Greater than 30 minutes including physician time.  Jonetta Speak NP 11/22/2016, 12:23 PM   Patient seen and examined.  Plan as discussed in my rounding note for today and outlined above. Jeneen Rinks [facility]  11/22/2016  12:41 PM

## 2016-11-22 NOTE — Progress Notes (Signed)
Patient Name: Adam Harris Date of Encounter: 11/22/2016  Primary Cardiologist: Dr [facility] Problem List     Principal Problem:   NSTEMI (non-ST elevated myocardial infarction) [facility]) Active Problems:   Essential hypertension   Dyslipidemia   Coronary artery disease   Diabetes mellitus, type 2 (HCC)   Chronic renal disease, stage III   Atrial fibrillation with RVR (HCC)     Subjective   No chest pain, no SOB.  Concerned about med costs as his wife's meds are expensive but willing to try qd rx for ease of compliance. Was rarely taking rx at night.  Wonders if he is going to die soon.  He wonders if CABG would help.  He wonders if he can go home.  Inpatient Medications    Scheduled Meds: . aspirin  81 mg Oral Daily  . atorvastatin  80 mg Oral q1800  . isosorbide mononitrate  30 mg Oral Daily  . metoprolol tartrate  37.5 mg Oral BID  . sodium chloride flush  3 mL Intravenous Q12H   Continuous Infusions: . sodium chloride Stopped (11/22/16 0345)  . heparin 1,300 Units/hr (11/21/16 2108)   PRN Meds: sodium chloride, acetaminophen, nitroGLYCERIN, ondansetron (ZOFRAN) IV, sodium chloride flush, traZODone   Vital Signs    Vitals:   11/21/16 2010 11/22/16 0009 11/22/16 0328 11/22/16 0333  BP: 120/88 102/79 123/81   Pulse: 81 65 70   Resp:   19   Temp: 98.8 F (37.1 C) 98 F (36.7 C) 97.8 F (36.6 C)   TempSrc: Oral Oral Oral   SpO2: 96% 95% 97%   Weight:    230 lb 8 oz (104.6 kg)  Height:        Intake/Output Summary (Last 24 hours) at 11/22/16 0742 Last data filed at 11/22/16 0659  Gross per 24 hour  Intake              933 ml  Output              775 ml  Net              158 ml   Filed Weights   11/20/16 1031 11/22/16 0333  Weight: 226 lb 4.8 oz (102.6 kg) 230 lb 8 oz (104.6 kg)    Physical Exam    GEN: Well nourished, well developed, in no acute distress.  HEENT: Grossly normal.  Neck: Supple, no JVD seen, difficult to assess 2nd body  habitus, carotid bruits, or masses. Cardiac: Irreg R&R, no murmurs, rubs, or gallops. No clubbing, cyanosis, edema.  Radials/DP/PT 2+ and equal bilaterally. R radial cath site without ecchymosis or hematoma Respiratory:  Respirations regular and unlabored, decreased BS bases bilaterally. GI: Soft, nontender, nondistended, BS + x 4. MS: no deformity or atrophy. Skin: warm and dry, no rash. Neuro:  Strength and sensation are intact. Psych: AAOx3.  Normal affect.  Labs    CBC  Recent Labs  11/20/16 0500 11/21/16 0551  WBC 17.8* 10.8*  NEUTROABS 14.6*  --   HGB 16.2 13.1  HCT 47.0 39.8  MCV 87.4 87.9  PLT 257 242   Basic Metabolic Panel  Recent Labs  11/20/16 0500 11/20/16 1121 11/21/16 0551  NA 140  --  139  K 3.7  --  3.8  CL 105  --  106  CO2 23  --  26  GLUCOSE 176*  --  154*  BUN 33*  --  24*  CREATININE 1.50*  --  1.54*  CALCIUM 9.6  --  8.9  MG  --  1.9  --    Cardiac Enzymes  Recent Labs  11/20/16 1121 11/20/16 1615 11/20/16 2313  TROPONINI 1.77* 2.53* 2.35*   Fasting Lipid Panel  Recent Labs  11/21/16 0551  CHOL 124  HDL 31*  LDLCALC 74  TRIG 97  CHOLHDL 4.0   Thyroid Function Tests  Recent Labs  11/20/16 1121  TSH 1.287    Telemetry    Atrial fib, RVR at times, no pauses > 2.5 sec - Personally Reviewed  ECG    n/a - Personally Reviewed  Radiology    Dg Chest Portable 1 View  Result Date: 11/20/2016 CLINICAL DATA:  Chest pain and diaphoresis tonight. EXAM: PORTABLE CHEST 1 VIEW COMPARISON:  02/03/2014 FINDINGS: Normal heart size and pulmonary vascularity. No focal airspace disease or consolidation in the lungs. No blunting of costophrenic angles. No pneumothorax. Mediastinal contours appear intact. Calcified and tortuous aorta. IMPRESSION: No active disease. Electronically Signed   By: Lucienne Capers M.D.   On: 11/20/2016 04:34     Cardiac Studies   11/21/2008 CATH  Prox RCA lesion, 100 %stenosed.  Ost LAD lesion, 45  %stenosed.  3rd Mrg lesion, 80 %stenosed.  Mid Cx to Dist Cx lesion, 100 %stenosed.  Lat 3rd Mrg lesion, 50 %stenosed.  Multivessel CAD: The LAD had ostial 40 to less than 50% focal stenosis.  There were mild luminal irregularities in the LAD, which extended to the apex but otherwise was without additional significant obstructive disease.    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  The OM1 vessel had 80% stenosis before giving rise to a branch and then had 50% stenosis in the inferior limb.  The AV groove circumflex was 99/100% occluded immediately after the large aneurysmal segment and  there was faint filling of a very large an additional aneurysmal segment with thrombus and very faint filling of the distal posterolateral branches.  There was faint collateralization of the distal posterolateral branches via the inferior branch of the obtuse marginal vessel.       The RCA was totally occluded proximally as had been previously demonstrated and there were faint bridging collaterals supplying the mid RCA which was a nondominant vessel distally.    LVEDP 18 mm Hg. RECOMMENDATION: Angiographic findings were reviewed with Dr. Tamala Julian.  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  His RCA was totally occluded at that time.  His RCA is essentially unchanged.  His AV groove circumflex is now occluded, which most likely is the culprit lesion.  The patient had been inadequately been taking his anticoagulation.  Heparin will be resumed tonight with plans to resume oral anticoagulation.  Initially, the patient will be treated with increasing medical therapy.  He is completely pain-free.  He will be started on nitrates, in addition to  increased beta blocker therapy.  He is not a candidate for stenting due to the significant aneurysmal dilatations throughout his vessels.  If he develops increasing symptomatology, consider possible PTCA and/or evaluation for surgical revascularization.  Patient Profile     71 y/o male with h/o CAD and PAF, presenting to the ED with complaint of chest pain, found to be in atrial fibrillation/flutter w/  RVR and troponin elevation c/w NSTEMI.   Assessment & Plan    Principal Problem:   NSTEMI (non-ST elevated myocardial infarction) [facility]) Active Problems:   Essential hypertension   Dyslipidemia   Coronary artery disease   Diabetes mellitus, type 2 (HCC)   Chronic renal disease, stage III   Atrial fibrillation with RVR ([place])  1. CAD/ NSTEMI: Troponin peak 2.53. He is on IV heparin,  ASA, BB, statin. Prior cath in 2015 showed chronically occluded RCA with faint left-to-right collateralsand an aneurysmal circumflex with sluggish flow>>Now occluded also. Consider PTCA vs TCTS consult if sx not controlled.  - He is currently CP free on heparin. Continue ASA, Lipitor and metoprolol.   Imdur added. - cardiac rehab to see.  2. Atrial Flutter: HR controlled most of the time. IV Cardizem discontinued 01/08 due to bradycardia with HR in the 40s. -  Home BB, metoprolol 25 mg bid >>37.5 mg bid 01/09, with hold parameters.  - Continue to monitor on telemetry. See how HR does w/ ambulation - Eliquis was on hold for cath. He is on IV heparin. Will transition to once daily Xarelto to help improve compliance. Pt admits to difficulties remembering to take evening dose of Eliquis at home.  - 2D echo pending.  - TSH, Mg and K all WNL. UA negative for UTI. CXR unremarkable. WBC improving from 17>>10.8 so probably 2nd MI   3. DLD: fasting Lipid panel shows LDL of 74 mg/dL. Given known CAD and new NSTEMI, will increase Lipitor from 20-> 80 mg daily for further LDL reduction.   4. Chronic Renal Insufficiency:  SCr is 1.54 precath. His home metformin and ARB currently on hold. F/u BMP today (ordered).   5. DM: Metformin on hold for cath. Fasting BG was 154. Treat with SSI.   6. HTN: BP is currently well controlled. ARB currently on hold for cath. Monitor pressures.   Plan: Pt pm compliance is poor. Will change Eliquis to Xarelto. Can change metoprolol to XL. Ok to take statin in am if he otherwise will not remember to take it at all.  - ambulate with cardiac rehab. If does well, consider d/c today w/ TCM f/u in office.  Signed, Rosaria Ferries, PA-C  11/22/2016, 7:42 AM   History and all data above reviewed.  Patient examined.  I agree with the findings as above.  No chest pain.  No SOB.  Ambulated in the hall.  OK to discharge  The patient exam reveals COR:RRR  ,  Lungs: Clear  ,  Abd: Positive bowel sounds, no rebound no guarding, Ext No edema.    .  All available labs, radiology testing, previous records reviewed. Agree with documented assessment and plan. CAD:  Disease as above.  Beta blocker increased.  OK to go home.  Cardiac rehab is seeing. I have consulted dietary.    Jeneen Rinks Ezelle Surprenant  11:28 AM  11/22/2016

## 2016-11-22 NOTE — Progress Notes (Signed)
  Echocardiogram 2D Echocardiogram has been performed.  Adam Harris 11/22/2016, 1:42 PM

## 2016-11-22 NOTE — Progress Notes (Signed)
CARDIAC REHAB PHASE I   PRE:  Rate/Rhythm: 111 a fib  BP:  Sitting: 126/82        SaO2: 95 RA  MODE:  Ambulation: 550 ft   POST:  Rate/Rhythm: 101 a fib  BP:  Sitting: 121/95         SaO2: 95 RA  Pt ambulated 550 ft on RA, IV, handheld assist, steady gait, tolerated well with no complaints other than chronic knee pain. Completed MI education.  Reviewed risk factors, MI book, anti-platelet therapy, activity restrictions, ntg, exercise, heart healthy diet, carb counting, and phase 2 cardiac rehab. Pt verbalized understanding. Pt agrees to phase 2 cardiac rehab referral, will send to [facility] per pt request. Pt to recliner after walk, call bell within reach.    [id number] [name], RN, BSN 11/22/2016 10:19 AM

## 2016-11-22 NOTE — Discharge Instructions (Addendum)
Atrial Fibrillation Introduction Atrial fibrillation is a type of heartbeat that is irregular or fast (rapid). If you have this condition, your heart keeps quivering in a weird (chaotic) way. This condition can make it so your heart cannot pump blood normally. Having this condition gives a person more risk for stroke, heart failure, and other heart problems. There are different types of atrial fibrillation. Talk with your doctor to learn about the type that you have. Follow these instructions at home:  Take over-the-counter and prescription medicines only as told by your doctor.  If your doctor prescribed a blood-thinning medicine, take it exactly as told. Taking too much of it can cause bleeding. If you do not take enough of it, you will not have the protection that you need against stroke and other problems.  Do not use any tobacco products. These include cigarettes, chewing tobacco, and e-cigarettes. If you need help quitting, ask your doctor.  If you have apnea (obstructive sleep apnea), manage it as told by your doctor.  Do not drink alcohol.  Do not drink beverages that have caffeine. These include coffee, soda, and tea.  Maintain a healthy weight. Do not use diet pills unless your doctor says they are safe for you. Diet pills may make heart problems worse.  Follow diet instructions as told by your doctor.  Exercise regularly as told by your doctor.  Keep all follow-up visits as told by your doctor. This is important. Contact a doctor if:  You notice a change in the speed, rhythm, or strength of your heartbeat.  You are taking a blood-thinning medicine and you notice more bruising.  You get tired more easily when you move or exercise. Get help right away if:  You have pain in your chest or your belly (abdomen).  You have sweating or weakness.  You feel sick to your stomach (nauseous).  You notice blood in your throw up (vomit), poop (stool), or pee (urine).  You are  short of breath.  You suddenly have swollen feet and ankles.  You feel dizzy.  Your suddenly get weak or numb in your face, arms, or legs, especially if it happens on one side of your body.  You have trouble talking, trouble understanding, or both.  Your face or your eyelid droops on one side. These symptoms may be an emergency. Do not wait to see if the symptoms will go away. Get medical help right away. Call your local emergency services (911 in the [place] Do not drive yourself to the hospital.  This information is not intended to replace advice given to you by your health care provider. Make sure you discuss any questions you have with your health care provider. Document Released: 08/08/2008 Document Revised: 04/06/2016 Document Reviewed: 02/24/2015  2017 Elsevier PLEASE REMEMBER TO BRING ALL OF YOUR MEDICATIONS TO EACH OF YOUR FOLLOW-UP OFFICE VISITS.  PLEASE ATTEND ALL SCHEDULED FOLLOW-UP APPOINTMENTS.   Activity: Increase activity slowly as tolerated. You may shower, but no soaking baths (or swimming) for 1 week. No driving for 1 week. No lifting over 5 lbs for 2 weeks. No sexual activity for 1 week.   You May Return to Work: in 3 weeks (if applicable)  Wound Care: You may wash cath site gently with soap and water. Keep cath site clean and dry. If you notice pain, swelling, bleeding or pus at your cath site, please call [phone].    Cardiac Cath Site Care Refer to this sheet in the next few weeks. These  instructions provide you with information on caring for yourself after your procedure. Your caregiver may also give you more specific instructions. Your treatment has been planned according to current medical practices, but problems sometimes occur. Call your caregiver if you have any problems or questions after your procedure. HOME CARE INSTRUCTIONS  You may shower 24 hours after the procedure. Remove the bandage (dressing) and gently wash the site with plain soap and water.  Gently pat the site dry.   Do not apply powder or lotion to the site.   Do not sit in a bathtub, swimming pool, or whirlpool for 5 to 7 days.   No bending, squatting, or lifting anything over 10 pounds (4.5 kg) as directed by your caregiver.   Inspect the site at least twice daily.   Do not drive home if you are discharged the same day of the procedure. Have someone else drive you.   You may drive 24 hours after the procedure unless otherwise instructed by your caregiver.  What to expect:  Any bruising will usually fade within 1 to 2 weeks.   Blood that collects in the tissue (hematoma) may be painful to the touch. It should usually decrease in size and tenderness within 1 to 2 weeks.  SEEK IMMEDIATE MEDICAL CARE IF:  You have unusual pain at the site or down the affected limb.   You have redness, warmth, swelling, or pain at the site.   You have drainage (other than a small amount of blood on the dressing).   You have chills.   You have a fever or persistent symptoms for more than 72 hours.   You have a fever and your symptoms suddenly get worse.   Your leg becomes pale, cool, tingly, or numb.   You have heavy bleeding from the site. Hold pressure on the site.  Document Released: 12/02/2010 Document Revised: 10/19/2011 Document Reviewed:   Information on my medicine - XARELTO (Rivaroxaban)  This medication education was reviewed with me or my healthcare representative as part of my discharge preparation.  Why was Xarelto prescribed for you? Xarelto was prescribed for you to reduce the risk of a blood clot forming that can cause a stroke if you have a medical condition called atrial fibrillation (a type of irregular heartbeat).  What do you need to know about xarelto ? Take your Xarelto ONCE DAILY at the same time every day with your evening meal. If you have difficulty swallowing the tablet whole, you may crush it and mix in applesauce just prior to taking your  dose.  Take Xarelto exactly as prescribed by your doctor and DO NOT stop taking Xarelto without talking to the doctor who prescribed the medication.  Stopping without other stroke prevention medication to take the place of Xarelto may increase your risk of developing a clot that causes a stroke.  Refill your prescription before you run out.  After discharge, you should have regular check-up appointments with your healthcare provider that is prescribing your Xarelto.  In the future your dose may need to be changed if your kidney function or weight changes by a significant amount.  What do you do if you miss a dose? If you are taking Xarelto ONCE DAILY and you miss a dose, take it as soon as you remember on the same day then continue your regularly scheduled once daily regimen the next day. Do not take two doses of Xarelto at the same time or on the same day.   Important  Safety Information A possible side effect of Xarelto is bleeding. You should call your healthcare provider right away if you experience any of the following: ? Bleeding from an injury or your nose that does not stop. ? Unusual colored urine (red or dark brown) or unusual colored stools (red or black). ? Unusual bruising for unknown reasons. ? A serious fall or if you hit your head (even if there is no bleeding).  Some medicines may interact with Xarelto and might increase your risk of bleeding while on Xarelto. To help avoid this, consult your healthcare provider or pharmacist prior to using any new prescription or non-prescription medications, including herbals, vitamins, non-steroidal anti-inflammatory drugs (NSAIDs) and supplements.  This website has more information on Xarelto: [email or web address]

## 2016-11-22 NOTE — Progress Notes (Addendum)
ANTICOAGULATION CONSULT NOTE - Follow Up Consult  Pharmacy Consult for Heparin > Xarelto Indication: chest pain/ACS and history of atrial fibrillation (Eliquis PTA)  No Known Allergies  Patient Measurements: Height: 5\' 11"  (180.3 cm) Weight: 230 lb 8 oz (104.6 kg) IBW/kg (Calculated) : 75.3 Heparin Dosing Weight: 96.7 kg  Vital Signs: Temp: 98.1 F (36.7 C) (01/10 0821) Temp Source: Oral (01/10 0821) BP: 121/81 (01/10 0821) Pulse Rate: 71 (01/10 0821)  Labs:  Recent Labs  11/20/16 0500 11/20/16 1121 11/20/16 1145 11/20/16 1615 11/20/16 2034 11/20/16 2313 11/21/16 0551  HGB 16.2  --   --   --   --   --  13.1  HCT 47.0  --   --   --   --   --  39.8  PLT 257  --   --   --   --   --  196  APTT  --   --  35  --  74*  --  89*  LABPROT  --   --   --   --   --   --  15.6*  INR  --   --   --   --   --   --  1.24  HEPARINUNFRC  --   --  1.58*  --   --   --  1.10*  CREATININE 1.50*  --   --   --   --   --  1.54*  TROPONINI  --  1.77*  --  2.53*  --  2.35*  --     Estimated Creatinine Clearance: 54.9 mL/min (by C-G formula based on SCr of 1.54 mg/dL (H)).   Medications:  Infusions:  . sodium chloride Stopped (11/22/16 0345)  . heparin 1,300 Units/hr (11/21/16 2108)    Assessment: 71 year old male on Eliquis PTA for history of atrial fibrillation with new chest pain transitioning from Eliquis to IV heparin for cardiac cath.  Pt is now s/p cath with heparin resumed. Ultimate plans for medical management of CAD and likely restart oral anticoagulation 1/10.  Pharmacy consulted to transition from heparin to Xarelto (due to compliance issues with evening dose of Eliquis). CBC wnl, CrCl>50, no bleed documented.  Goal of Therapy:  Stroke prevention Monitor platelets by anticoagulation protocol: Yes   Plan:  D/c heparin at time of 1st dose of Xarelto 20mg  Qbreakfast - communicated plan with RN Monitor CBC, s/sx bleeding   Elicia Lamp, PharmD, BCPS Clinical  Pharmacist 11/22/2016 8:35 AM

## 2016-11-23 ENCOUNTER — Telehealth: Payer: Self-pay | Admitting: *Deleted

## 2016-11-23 NOTE — Telephone Encounter (Signed)
Pt was on TCM list admitted for NSTEMI (non-ST elevated myocardial infarction) ([place]). Pt was D/C 1/10,. And will f/u w/Dr. Jenkins Rouge...Johny Chess

## 2016-11-24 ENCOUNTER — Telehealth: Payer: Self-pay | Admitting: Cardiology

## 2016-11-24 NOTE — Telephone Encounter (Signed)
SPOKE TO PATIENT  Patient wanted to know if he should be taking tribeniznor . This medication was not on discharge list. Patient states he has been taking medication for years.  RN  Reviewed DISCHARGE INSTRUCTION SUMMARY  patient is not take medication .  Follow up at office appointment 11/29/16  With [facility]  Patient also states he get short of breath ( anxious when he lays flat.  no noted edema, congestion . Recommend  Use 2 pillows , or elevate head of bed or use pillow wedge.  If continue inform extender at office appointment. Patient verbalized understanding.

## 2016-11-24 NOTE — Telephone Encounter (Signed)
Pt was discharged from the hospital.Question about the medication list he received on discharge.

## 2016-11-27 ENCOUNTER — Telehealth: Payer: Self-pay | Admitting: Cardiology

## 2016-11-27 NOTE — Telephone Encounter (Signed)
Spoke with pt states that he has sores with scabs on his head, arms and legs. He states that he just noticed he had them Saturday. Pt states no other symptoms, but they do look infected because they are red around scab. Informed pt to clean area, and keep covered to avoid infection and discuss at 11-29-16 appoint with Almyra Deforest because he sill thinks that this is from [place].

## 2016-11-27 NOTE — Telephone Encounter (Signed)
Mr.Onstad is calling about a new blood thinner and he is breaking out in sores on his elbows ,legs calfs , hands and on top of his head . He cannot remember the name of the medication . Please call

## 2016-11-28 NOTE — Telephone Encounter (Signed)
Will assess on followup. Thank you

## 2016-11-29 ENCOUNTER — Ambulatory Visit: Payer: Medicare Other | Admitting: Physician Assistant

## 2016-12-01 ENCOUNTER — Telehealth: Payer: Self-pay | Admitting: Cardiology

## 2016-12-01 ENCOUNTER — Telehealth (HOSPITAL_COMMUNITY): Payer: Self-pay | Admitting: Internal Medicine

## 2016-12-01 MED ORDER — APIXABAN 5 MG PO TABS: 5.0000 mg | ORAL_TABLET | Freq: Two times a day (BID) | ORAL | 5 refills | Status: DC

## 2016-12-01 NOTE — Telephone Encounter (Signed)
Xarelto may cause skin blister in ~ 1% of patients.   Recommend to use urgent care services if blisters "infected" .   May go back to eliquis 5mg  BID if okay with cardiologist.  LFT = within normal limits Age=71yo Scr=1.29 (CrCl= 77ml/min ; est with IBW) BMI = 32.2

## 2016-12-01 NOTE — Telephone Encounter (Addendum)
Routed to Dr. Percival Spanish to review medications and determine if patient can resume eliquis in place of xarelto (changed at hospital discharge)  Spoke with Dr. Percival Spanish - he is [place] with patient changed back to eliquis 5mg  BID Notified patient Stressed importance of med adherence. He voiced understanding He has some eliquis at home and also asked for refill to be sent to pharmacy Rx(s) sent to pharmacy electronically.  xarelto added to allergy list

## 2016-12-01 NOTE — Telephone Encounter (Signed)
Pt had left arm pain last night, denies any pain today, denies chest pain, SOB, dizziness-lightheadedness -has some sores on body-wife wants pt seen today-pls advise

## 2016-12-01 NOTE — Telephone Encounter (Signed)
Attempted to return call to patient's wife, VM not set up  Called patient. He states he had left arm pain last night - not severe He states every time he is in the hospital he gets sores on his legs and his arms (see triage call from 1/15) - med changes were made upon hospital discharge  - patient OK to review symptoms and sores w/provider at appt on 1/25 - aware there is no sooner appt  Wife returned call - she states patient is weak, tired - explained that metoprolol tartrate was changed to metoprolol succinate at increased dose (tribenzor stopped) - explained that beta-blocker can cause fatigue at times -- wife states patient has red, scab-like sores on his elbow, leg, head and is cleaning his "sores" with alcohol -- wife would like to know if xarelto could cause this - would like him to go back on eliquis   Both patient and wife have concerns about patient being on xarelto given lawsuits advertised on TV. Wife would like the patient to go back on eliquis.   Will route to clinical pharmacy staff for assistance Explained to patient's wife that we will call back with advice but it is imperative that he take blood thinner

## 2016-12-01 NOTE — Telephone Encounter (Signed)
S/w Laquina H. from [employer] verifying benefits for pt for Cardiac Rehab. Primary Insurance is [employer] Medicare and Secondary Insurance is [employer] with $1,080.00 Deductible,30% co-insurance and Out of Pocket $4,388.00, with nothing met. No limit of visits.... Reference # [id number]... KJ °

## 2016-12-03 NOTE — Telephone Encounter (Signed)
OK to restart Eliquis which he was taking previously.  Call Mr. Clayson with the results and send results to Hoyt Koch, MD

## 2016-12-07 ENCOUNTER — Ambulatory Visit (INDEPENDENT_AMBULATORY_CARE_PROVIDER_SITE_OTHER): Payer: Medicare Other | Admitting: Physician Assistant

## 2016-12-07 ENCOUNTER — Encounter: Payer: Self-pay | Admitting: Physician Assistant

## 2016-12-07 VITALS — BP 149/100 | HR 85 | Ht 71.0 in | Wt 223.6 lb

## 2016-12-07 DIAGNOSIS — I214 Non-ST elevation (NSTEMI) myocardial infarction: Secondary | ICD-10-CM | POA: Diagnosis not present

## 2016-12-07 DIAGNOSIS — I4891 Unspecified atrial fibrillation: Secondary | ICD-10-CM | POA: Diagnosis not present

## 2016-12-07 DIAGNOSIS — R0602 Shortness of breath: Secondary | ICD-10-CM | POA: Diagnosis not present

## 2016-12-07 DIAGNOSIS — I480 Paroxysmal atrial fibrillation: Secondary | ICD-10-CM

## 2016-12-07 MED ORDER — OLMESARTAN-AMLODIPINE-HCTZ 40-10-12.5 MG PO TABS: 0.5000 | ORAL_TABLET | Freq: Every day | ORAL | 6 refills | Status: DC

## 2016-12-07 MED ORDER — METOPROLOL SUCCINATE ER 100 MG PO TB24: 100.0000 mg | ORAL_TABLET | Freq: Every day | ORAL | 6 refills | Status: DC

## 2016-12-07 NOTE — Progress Notes (Signed)
Cardiology Office Note   Date:  12/07/2016   ID:  Adam Harris, DOB 1946-07-25, MRN [id number]  PCP:  Hoyt Koch, MD  Cardiologist:  Dr. Percival Spanish 08/29/2016 and in the hospital [place], Suanne Marker, [place]   Chief Complaint  Patient presents with  . Follow-up  . Shortness of Breath    with exertion; when laying down at night;  . Fatigue    constantly during the day.     History of Present Illness: Adam Harris is a 71 y.o. male with a history of 100% RCA, PAF, HTN  Admit 01/08-01/10 with atrial fibrillation, RVR and NSTEMI. S/p cath w/ RCA & CFX 100%, med rx for now. LFTs were pending. Beta blocker increased for better heart rate control. Eliquis was changed to Xarelto for cost savings, the patient developed skin sores and switched back to Eliquis.  Adam Harris presents for post-hospital follow up.  He is having trouble breathing when he lays down at night. He is sleeping in the car so he can sit up. He denies DOE, but his wife feels he gets winded and he states his legs get tired. He does c/o fatigue, but feels this is because he is not sleeping at night. He snores heavily and may stop breathing at times at night.   He has not had palpitations, is not aware of his irregular HR.   He got lesions all over after d/c, they were blisters which popped open. They have improved since stopping the Xarelto, he is tolerating the Eliquis.   He has not had chest pain. He is compliant with meds, he had not been in the past. He is pleased that he has lost weight. His diet is much better. He is walking around the grocery store with his wife, and states he will walk for about 10 minutes at a time. He is interested in cardiac rehabilitation. He has not had chest pain with ambulation. He gets a little short of breath, and his legs get tired.   Past Medical History:  Diagnosis Date  . Arthritis   . CAD (coronary artery disease)    Occluded RCA 12/2013; NSTEMI 11/2016 w/ RCA & CFX 100%, med rx. If  that does not work, PTCA or CABG  . Hypertension   . Persistent atrial fibrillation ([place]) 11/2016    Past Surgical History:  Procedure Laterality Date  . CARDIAC CATHETERIZATION N/A 11/21/2016   Procedure: Left Heart Cath and Coronary Angiography;  Surgeon: Troy Sine, MD;  Location: [facility];  Service: Cardiovascular;  Laterality: N/A;  . KNEE ARTHROSCOPY    . LEFT HEART CATHETERIZATION WITH CORONARY ANGIOGRAM N/A 02/04/2014   Procedure: LEFT HEART CATHETERIZATION WITH CORONARY ANGIOGRAM;  Surgeon: [name], MD;  Location: [facility];  Service: Cardiovascular;  Laterality: N/A;    Current Outpatient Prescriptions  Medication Sig Dispense Refill  . apixaban (ELIQUIS) 5 MG TABS tablet Take 1 tablet (5 mg total) by mouth 2 (two) times daily. 60 tablet 5  . aspirin EC 81 MG EC tablet Take 1 tablet (81 mg total) by mouth daily.    Marland Kitchen atorvastatin (LIPITOR) 80 MG tablet Take 1 tablet (80 mg total) by mouth every evening. 30 tablet 11  . isosorbide mononitrate (IMDUR) 30 MG 24 hr tablet Take 1 tablet (30 mg total) by mouth daily. 60 tablet 11  . metFORMIN (GLUCOPHAGE) 500 MG tablet Take ONE (1) tablet by mouth twice daily with meals 60 tablet 3  . metoprolol  succinate (TOPROL-XL) 25 MG 24 hr tablet Take 3 tablets (75 mg total) by mouth daily. 90 tablet 11  . nitroGLYCERIN (NITROSTAT) 0.4 MG SL tablet Place 1 tablet under the tongue every 5 minutes times 3 doses as needed for chest pains, call 911 if 2nd dose does not help 25 tablet 6   No current facility-administered medications for this visit.     Allergies:   Xarelto [rivaroxaban]    Social History:  The patient  reports that he has never smoked. He has never used smokeless tobacco. He reports that he does not drink alcohol or use drugs.   Family History:  The patient's family history includes Hypertension in his mother.    ROS:  Please see the history of present illness. All other systems are reviewed and negative.     PHYSICAL EXAM: VS:  BP (!) 149/100   Pulse 85   Ht 5\' 11"  (1.803 m)   Wt 223 lb 9.6 oz (101.4 kg)   BMI 31.19 kg/m  , BMI Body mass index is 31.19 kg/m. GEN: Well nourished, well developed, male in no acute distress  HEENT: normal for age  Neck: minimal JVD, no carotid bruit, no masses Cardiac: Irreg R&R; no murmur, no rubs, ?+S3 gallop Respiratory: decreased BS bases bilaterally with few rales R, normal work of breathing GI: soft, nontender, nondistended, + BS MS: no deformity or atrophy; no edema; distal pulses are 2+ in all 4 extremities   Skin: warm and dry, no rash Neuro:  Strength and sensation are intact Psych: euthymic mood, full affect   EKG:  EKG is ordered today. The ekg ordered today demonstrates Atrial fib, HR 95.  ECHO: 11/22/2016 - Left ventricle: The cavity size was normal. Systolic function was   normal. The estimated ejection fraction was in the range of 55%   to 60%. Wall motion was normal; there were no regional wall   motion abnormalities. - Aortic valve: Trileaflet; moderately thickened, moderately   calcified leaflets. - Mitral valve: There was mild regurgitation. - Left atrium: The atrium was moderately dilated. - Right atrium:  The atrium was normal in size. - Tricuspid valve: There was mild regurgitation. - Pulmonary arteries: Systolic pressure was mildly increased. PA   peak pressure: 37 mm Hg (S). Impressions: - Compared to the prior study, there has been no significant   interval change.  CATH: 11/21/2016  Prox RCA lesion, 100 %stenosed.  Ost LAD lesion, 45 %stenosed.  3rd Mrg lesion, 80 %stenosed.  Mid Cx to Dist Cx lesion, 100 %stenosed.  Lat 3rd Mrg lesion, 50 %stenosed.  Multivessel CAD: The LAD had ostial 40 to less than 50% focal stenosis.  There were mild luminal irregularities in the LAD, which extended to the apex but otherwise was without additional significant obstructive disease. The left circumflex vessel was a  large dominant vessel that had a large region of aneurysmal dilation in the proximal to mid segment prior to bifurcating into a bifurcating marginal branch and the  AV groove circumflex.  The OM1 vessel had 80% stenosis before giving rise to a branch and then had 50% stenosis in the inferior limb.  The AV groove circumflex was 99/100% occluded immediately after the large aneurysmal segment and  there was faint filling of a very large an additional aneurysmal segment with thrombus and very faint filling of the distal posterolateral branches.  There was faint collateralization of the distal posterolateral branches via the inferior branch of the obtuse marginal vessel. The RCA  was totally occluded proximally as had been previously demonstrated and there were faint bridging collaterals supplying the mid RCA which was a nondominant vessel distally. VEDP 18 mm Hg. RECOMMENDATION: Angiographic findings were reviewed with Dr. Tamala Julian.  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  His RCA was totally occluded at that time.  His RCA is essentially unchanged.  His AV groove circumflex is now occluded, which most likely is the culprit lesion.  The patient had been inadequately been taking his anticoagulation.  Heparin will be resumed tonight with plans to resume oral anticoagulation.  Initially, the patient will be treated with increasing medical therapy.  He is completely pain-free.  He will be started on nitrates, in addition to increased beta blocker therapy.  He is not a candidate for stenting due to the significant aneurysmal dilatations throughout his vessels.  If he develops increasing symptomatology, consider possible PTCA and/or evaluation for surgical revascularization.    Dg Chest Portable 1 View  Result Date: 11/20/2016 CLINICAL DATA:  Chest pain and diaphoresis tonight. EXAM: PORTABLE CHEST 1 VIEW  COMPARISON:  02/03/2014 FINDINGS: Normal heart size and pulmonary vascularity. No focal airspace disease or consolidation in the lungs. No blunting of costophrenic angles. No pneumothorax. Mediastinal contours appear intact. Calcified and tortuous aorta. IMPRESSION: No active disease. Electronically Signed   By: Lucienne Capers M.D.   On: 11/20/2016 04:34     Recent Labs: 11/20/2016: Magnesium 1.9; TSH 1.287 11/22/2016: ALT 20; BUN 17; Creatinine, Ser 1.29; Hemoglobin 12.6; Platelets 182; Potassium 3.9; Sodium 141   Lipid Panel    Component Value Date/Time   CHOL 124 11/21/2016 0551   TRIG 97 11/21/2016 0551   HDL 31 (L) 11/21/2016 0551   CHOLHDL 4.0 11/21/2016 0551   VLDL 19 11/21/2016 0551   LDLCALC 74 11/21/2016 0551     Wt Readings from Last 3 Encounters:  12/07/16 223 lb 9.6 oz (101.4 kg)  11/22/16 230 lb 8 oz (104.6 kg)  08/29/16 231 lb (104.8 kg)     Other studies Reviewed: Additional studies/ records that were reviewed today include: Office notes, hospital records and testing.  ASSESSMENT AND PLAN:  1.  Persistent atrial fib: His rate runs high pretty easily. Will increase the metoprolol from 75 mg 200 mg daily. He understands that the beta blocker may be making him tired, but it is help keep his heart rate under control and he needs that right now. He accepts this.  I feel he would benefit from maintaining sinus rhythm. His left atrium was moderately dilated, right atrium was normal in size. The importance of compliance with the Eliquis was emphasized. Once he has been on the Eliquis for or weeks, we can pursue cardioversion without a TEE which is preferable. The patient is agreeable to this and wished to pursue it. We will schedule cardioversion in 4 weeks and he is okay to get labs performed the day of the procedure.  2. Chronic anticoagulation: He is not having any bleeding issues with the Eliquis. The skin lesions that he developed on Xarelto were healing without any  signs of infection.  3. Hypertension: He liked the Tribenzor, felt that he did well on it.We can restart the Tribenzor at half dose. If his blood pressure still not adequately controlled, we can go to a full dose. He has a blood pressure cuff at home, and will watch for hypotension.  4. Dyspnea on  exertion: His last chest x-ray did not show any acute disease. He does not have significant volume overload by exam. However he has a mild cough and continues to have dyspnea on exertion. We'll recheck a chest x-ray and follow up on those results.   Current medicines are reviewed at length with the patient today.  The patient does not have concerns regarding medicines.  The following changes have been made:  Increase metoprolol, restart Tribenzor  Labs/ tests ordered today include:   Orders Placed This Encounter  Procedures  . DG Chest 2 View  . EKG 12-Lead     Disposition:   FU with Dr. Percival Spanish  Signed, Rosaria Ferries, PA-C  12/07/2016 1:52 PM    [facility] Phone: [phone]; Fax: [phone]  This note was written with the assistance of speech recognition software. Please excuse any transcriptional errors.

## 2016-12-07 NOTE — Patient Instructions (Signed)
Medication Instructions:  INCREASE metoprolol to 100mg  one time daily.  RESTART Tribenzor (1/2 tablet) one time daily.   Labwork: NONE  Testing/Procedures: Your physician has recommended that you have a Cardioversion (DCCV). Electrical Cardioversion uses a jolt of electricity to your heart either through paddles or wired patches attached to your chest. This is a controlled, usually prescheduled, procedure. Defibrillation is done under light anesthesia in the hospital, and you usually go home the day of the procedure. This is done to get your heart back into a normal rhythm. You are not awake for the procedure. Please see the instruction sheet given to you today.  A chest x-ray takes a picture of the organs and structures inside the chest, including the heart, lungs, and blood vessels. This test can show several things, including, whether the heart is enlarges; whether fluid is building up in the lungs; and whether pacemaker / defibrillator leads are still in place.   Follow-Up: Follow up after Cardioversion completed.  Any Other Special Instructions Will Be Listed Below (If Applicable).     If you need a refill on your cardiac medications before your next appointment, please call your pharmacy.

## 2016-12-12 ENCOUNTER — Ambulatory Visit (INDEPENDENT_AMBULATORY_CARE_PROVIDER_SITE_OTHER)
Admission: RE | Admit: 2016-12-12 | Discharge: 2016-12-12 | Disposition: A | Discharge status: Home / Self Care | Payer: Medicare Other | Source: Ambulatory Visit | Attending: Physician Assistant | Admitting: Physician Assistant

## 2016-12-12 DIAGNOSIS — I480 Paroxysmal atrial fibrillation: Secondary | ICD-10-CM | POA: Diagnosis not present

## 2016-12-12 DIAGNOSIS — R05 Cough: Secondary | ICD-10-CM | POA: Diagnosis not present

## 2016-12-12 DIAGNOSIS — R0602 Shortness of breath: Secondary | ICD-10-CM | POA: Diagnosis not present

## 2016-12-13 ENCOUNTER — Telehealth: Payer: Self-pay | Admitting: Physician Assistant

## 2016-12-13 NOTE — Telephone Encounter (Signed)
New message   Pt wife verbalized that he is calling for reults of X-ray

## 2016-12-13 NOTE — Telephone Encounter (Signed)
Spoke to wife. Noted CXR results still pending review, will communicate w her once read by Suanne Marker.  She outlines continued concerns for patient's dyspnea.  Wife states pt continues to get SOB and wake at night. He is a "side sleeper". Cannot determine a different cause for his sleep interruptions, he feels SOB and gets scared to go back to bed.  She's concerned that his shortness of breath, lethargy is tied to medication. Discussed w her and advised that often adjustments to medications such as his BB can produce increased lethargy (metoprolol increased at last visit). Advised to continue at most current dosing unless o/w instructed.  She asks if OK for pt to drink caffeine. Aware he was instructed to stop in hospital, wanted to verify. I advised caffeine can aggravate cardiac arrhythmias and not to resume it -- would need provider review.  Will seek review by [facility]. Wife requesting call back tomorrow. I've informed her I'm out of office but will request this be addressed by Suanne Marker or triage RN

## 2016-12-14 ENCOUNTER — Telehealth: Payer: Self-pay | Admitting: Cardiology

## 2016-12-14 DIAGNOSIS — Z79899 Other long term (current) drug therapy: Secondary | ICD-10-CM

## 2016-12-14 NOTE — Telephone Encounter (Signed)
New Message      Wife is returning your call about Adam Harris's xray results

## 2016-12-14 NOTE — Telephone Encounter (Signed)
Preliminary reading: IMPRESSION: Mild CHF with interstitial edema and small bilateral pleural effusions. No evidence of pneumonia.   Thoracic aortic atherosclerosis.  Awaiting hochrein

## 2016-12-14 NOTE — Telephone Encounter (Signed)
Spoke wit pt's wife she is very frustrated that Dr Percival Spanish has not read result for pt. I assured her that we have requested that Dr Percival Spanish for reading and also preliminary has been reviewed and that we sent additional message for results. Wife hung up.

## 2016-12-14 NOTE — Telephone Encounter (Signed)
Follow Up   Pt wife calling back frustrated that she has not been contacted back with her husbands results. Requesting a call back

## 2016-12-14 NOTE — Telephone Encounter (Signed)
I did not have a CXR in my in basket to review.  After seeing this note reviewed the CXR done on 1/30.  There was no pneumonia.  He might have some mild edema and might feel better with Lasix.  Give 20 mg daily po.  (disp Lasix 20 mg po daily disp number 30 with 3 refills).  Have him bet a bmet in one week.

## 2016-12-15 MED ORDER — FUROSEMIDE 20 MG PO TABS: 20.0000 mg | ORAL_TABLET | Freq: Every day | ORAL | 3 refills | Status: DC

## 2016-12-15 NOTE — Telephone Encounter (Signed)
Follow up   Pt wife verbalized that she is calling because she is becoming restless because she do not have the results

## 2016-12-15 NOTE — Telephone Encounter (Signed)
Returned call to patient's wife. She expressed understanding regarding results and recommendations. Thankful for return call and advice. I have called rx to pharmacy and ordered labwork for [place] at [place] per request. She's aware to call if no improvement or new concerns & that we'll follow up w the results of labwork to notify if potassium supplement advised.

## 2016-12-15 NOTE — Telephone Encounter (Signed)
See other note - CXR reviewed by Dr. Percival Spanish, recommendations communicated to caller.

## 2016-12-16 ENCOUNTER — Encounter: Payer: Self-pay | Admitting: Cardiology

## 2016-12-16 ENCOUNTER — Telehealth: Payer: Self-pay | Admitting: Cardiology

## 2016-12-16 NOTE — Telephone Encounter (Signed)
Patient called stating that he was recently placed on Lasix for volume overload.  He is on Lasix 20mg  daily.  He has noticed tonight that his feet are swollen.  He is not SOB except for going up stairs and some mild SOB when he lays down.  I instructed him to take another Lasix 20mg  tonight and then take 40mg  of Lasix in am.  He is to call in am if symptoms are not better or sooner if symptoms worsen.  I also instructed him to call on Monday for further recs on Lasix dosage from Dr. Percival Spanish on Monday.

## 2016-12-16 NOTE — Telephone Encounter (Signed)
This encounter was created in error - please disregard.

## 2016-12-18 ENCOUNTER — Telehealth: Payer: Self-pay | Admitting: Cardiology

## 2016-12-18 DIAGNOSIS — Z79899 Other long term (current) drug therapy: Secondary | ICD-10-CM

## 2016-12-18 NOTE — Telephone Encounter (Signed)
Pt called back needing clarification on lab order as well as when to go. Requested this for collection at [facility] lab site. Reminded him to get this done 1 week  After medication changes. Pt voiced understanding and thanks.

## 2016-12-18 NOTE — Telephone Encounter (Signed)
New message   Pt c/o swelling: STAT is pt has developed SOB within 24 hours  1. How long have you been experiencing swelling? Since Friday night  2. Where is the swelling located? Ankles - both  3.  Are you currently taking a "fluid pill"? Yes , talk on -call was advise to take 2  4.  Are you currently SOB? A little   5.  Have you traveled recently? No

## 2016-12-18 NOTE — Telephone Encounter (Signed)
Reviewed chart. Spoke to wife.  As instructed, pt started the lasix 20mg  on Friday. Noted no improvement so spoke to Dr. Radford Pax (on-call) over weekend who instructed to take 40mg . He did this Sunday, today as well. Wife states "slight" improvement in his LE swelling as compared to Wednesday or Thursday of last week. Wants to know if OK to continue the 40mg  lasix until swelling improved.  Advised since he already took 40mg  today, would reach out to Dr. Percival Spanish for further advice. Advised still to plan on doing BMET 1 week after initiation of medication.

## 2016-12-22 NOTE — Telephone Encounter (Signed)
"  Yellow sheet" filled out and dropped off w/ indication to call patient at phone number [phone].  Patient request reads "confused abt medication/med list". No other information given.   Called number, no answer, goes to VM. Left msg to call.

## 2016-12-25 ENCOUNTER — Telehealth: Payer: Self-pay | Admitting: Family Medicine

## 2016-12-25 NOTE — Telephone Encounter (Signed)
Patient called in upset because he got a no show fee.  He said that he does a lot of business with [facility] and that the fee will be waived.  I tried to calm patient down and tell him why we charge a no show fee.  Patient continued to yell.  I told patient I was not going to help him with him yelling at me.  I have transferred patient to your voice mail.

## 2016-12-26 NOTE — Telephone Encounter (Signed)
Msg left for patient to call.

## 2016-12-27 ENCOUNTER — Telehealth: Payer: Self-pay | Admitting: Cardiology

## 2016-12-27 NOTE — Telephone Encounter (Signed)
Agree with plan to increase Lasix x ~2 days, but with planned DCCV, would probably have him come in after ~2 d to assess - can see APP.  Glenetta Hew

## 2016-12-27 NOTE — Telephone Encounter (Signed)
  Pt of Dr. Phillips Grout to Manuela Schwartz, spouse of patient. Explains that the patient has been short of breath since hospital, cold as well, uses space heater to keep warm.  Notes that his sleep pattern is improved, but not sure it's where it should be. Still a little fatigued throughout the day.  Has upcoming TEE cardioversion on 2/20 w Dr. Oval Linsey.  He's continued to have shortness of breath, c/o this on exertion and sometimes when reclined. Wife states still having a little swelling in his lower extremities - this is improved from 1-2 weeks ago.  States pt has cough. Denies other URI symptoms.  She did not have any HR or BP readings for me. I did ask her to try to get VS if she's able.  Informed her I'd seek review from provider. Note that some of his symptoms should improve following cardioversion but would see if we could do anything to address the continued shortness of breath in the interim.  Last BMET ~1 month ago. Advise if OK to increase lasix, any other advice?

## 2016-12-27 NOTE — Telephone Encounter (Signed)
Please call,pt is having some issues,stays cold all the time and short of breath.

## 2016-12-27 NOTE — Telephone Encounter (Signed)
As discussed w Dr. Ellyn Hack, advice communicated to patient's wife. She voiced that the patient took extra lasix today for total of 40mg , and did note some improvement including reduced swelling in lower extremities. Advised to continue this for now and to follow up w APP. Appt made to see Adam Harris Friday afternoon.  Aware to call if new concerns.  We also discussed BP monitoring and obtaining a home cuff.

## 2016-12-29 ENCOUNTER — Encounter: Payer: Self-pay | Admitting: Physician Assistant

## 2016-12-29 ENCOUNTER — Ambulatory Visit (INDEPENDENT_AMBULATORY_CARE_PROVIDER_SITE_OTHER): Payer: Medicare Other | Admitting: Physician Assistant

## 2016-12-29 VITALS — BP 128/85 | HR 71 | Ht 71.0 in | Wt 221.0 lb

## 2016-12-29 DIAGNOSIS — I481 Persistent atrial fibrillation: Secondary | ICD-10-CM | POA: Diagnosis not present

## 2016-12-29 DIAGNOSIS — I5043 Acute on chronic combined systolic (congestive) and diastolic (congestive) heart failure: Secondary | ICD-10-CM | POA: Diagnosis not present

## 2016-12-29 DIAGNOSIS — I4819 Other persistent atrial fibrillation: Secondary | ICD-10-CM

## 2016-12-29 DIAGNOSIS — Z7901 Long term (current) use of anticoagulants: Secondary | ICD-10-CM

## 2016-12-29 NOTE — Progress Notes (Signed)
Cardiology Office Note   Date:  12/29/2016   ID:  Lyon Dumont, DOB 09-15-1946, MRN [id number]  PCP:  Hoyt Koch, MD  Cardiologist:  Dr. Percival Spanish 08/29/2016 and in the hospital [place], [address] 12/07/2016  Chief Complaint  Patient presents with  . Shortness of Breath  . Edema  . Cough    History of Present Illness: Adam Harris is a 71 y.o. male with a history of 100% RCA, PAF, HTN  Admit 01/08-01/10 with atrial fibrillation, RVR and NSTEMI. S/p cath w/ RCA & CFX 100%, med rx for now. LFTs OK. BB increased, Eliquis>>Xarelto for cost savings>>skin sores>>Eliquis at d/c. 01/25 office visit and beta blocker increased, follow-up in 3 weeks to schedule cardioversion, Tribenzor restarted at half dose, CXR with mild CHF, small effusions, Lasix started at 20 mg a day, BMET in one week 02/03 phone note describing shortness of breath, Lasix increased to 40 mg daily, BMET has not been done 02/14 phone note describing ongoing dyspnea on exertion and feeling cold all the time  Adam Harris presents for follow up.  His HR was > 140 about a week and a half ago. He was just checking his HR, but did feel a little bit off, just not quite normal. His HR has been otherwise normal.  He is generally compliant with his medications but did miss 2 doses of Lasix recently. His LE edema is worse because of that. He is not over-drinking but drinks Gatorade. His wife tries to help him be compliant with a low-sodium diet.   His breathing is better. He is weighing himself daily.His weight has been stable on his home scales until the last few days when it has trended up.  Even when his heart rate was 140 or more, he did not have any chest pain. He has not had any chest pain with exertion.  Even when his heart rate is controlled, he just does not feel that well.   Past Medical History:  Diagnosis Date  . Arthritis   . CAD (coronary artery disease)    Occluded RCA 12/2013; NSTEMI 11/2016 w/ RCA &  CFX 100%, med rx. If that does not work, PTCA or CABG  . Hypertension   . Persistent atrial fibrillation ([place]) 11/2016    Past Surgical History:  Procedure Laterality Date  . CARDIAC CATHETERIZATION N/A 11/21/2016   Procedure: Left Heart Cath and Coronary Angiography;  Surgeon: Troy Sine, MD;  Location: [facility];  Service: Cardiovascular;  Laterality: N/A;  . KNEE ARTHROSCOPY    . LEFT HEART CATHETERIZATION WITH CORONARY ANGIOGRAM N/A 02/04/2014   Procedure: LEFT HEART CATHETERIZATION WITH CORONARY ANGIOGRAM;  Surgeon: [name], MD;  Location: [facility];  Service: Cardiovascular;  Laterality: N/A;    Current Outpatient Prescriptions  Medication Sig Dispense Refill  . apixaban (ELIQUIS) 5 MG TABS tablet Take 1 tablet (5 mg total) by mouth 2 (two) times daily. 60 tablet 5  . aspirin EC 81 MG EC tablet Take 1 tablet (81 mg total) by mouth daily.    Marland Kitchen atorvastatin (LIPITOR) 80 MG tablet Take 1 tablet (80 mg total) by mouth every evening. 30 tablet 11  . furosemide (LASIX) 20 MG tablet Take 40 mg by mouth daily.    . isosorbide mononitrate (IMDUR) 30 MG 24 hr tablet Take 1 tablet (30 mg total) by mouth daily. 60 tablet 11  . metFORMIN (GLUCOPHAGE) 500 MG tablet Take ONE (1) tablet by mouth twice daily with  meals 60 tablet 3  . metoprolol succinate (TOPROL-XL) 100 MG 24 hr tablet Take 1 tablet (100 mg total) by mouth daily. 60 tablet 6  . nitroGLYCERIN (NITROSTAT) 0.4 MG SL tablet Place 1 tablet under the tongue every 5 minutes times 3 doses as needed for chest pains, call 911 if 2nd dose does not help 25 tablet 6  . Olmesartan-Amlodipine-HCTZ (TRIBENZOR) 40-10-12.5 MG TABS Take 0.5 tablets by mouth daily. 30 tablet 6   No current facility-administered medications for this visit.     Allergies:   Xarelto [rivaroxaban]    Social History:  The patient  reports that he has never smoked. He has never used smokeless tobacco. He reports that he does not drink alcohol or use  drugs.   Family History:  The patient's family history includes Hypertension in his mother.    ROS:  Please see the history of present illness. All other systems are reviewed and negative.    PHYSICAL EXAM: VS:  BP 128/85   Pulse 71   Ht 5\' 11"  (1.803 m)   Wt 221 lb (100.2 kg)   BMI 30.82 kg/m  , BMI Body mass index is 30.82 kg/m. GEN: Well nourished, well developed, male in no acute distress  HEENT: normal for age  Neck: JVD at 8 cm, mildly positive hepatojugular reflux, no carotid bruit, no masses Cardiac: Irregular rate and rhythm; no murmur, no rubs, or gallops Respiratory: Decreased breath sounds bases bilaterally, normal work of breathing GI: soft, nontender, nondistended, + BS MS: no deformity or atrophy; one-2+ lower extremity edema; distal pulses are 2+ in all 4 extremities   Skin: warm and dry, areas of petechiae noted on both lower extremities, there is some erythema on the right lower extremity but no weeping lesions, no areas of tenderness Neuro:  Strength and sensation are intact Psych: euthymic mood, full affect   EKG:  EKG is ordered today. The ekg ordered today demonstrates atrial fibrillation versus atypical atrial flutter, heart rate 74  ECHO: 11/22/2016 - Left ventricle: The cavity size was normal. Systolic function was normal. The estimated ejection fraction was in the range of 55% to 60%. Wall motion was normal; there were no regional wall motion abnormalities. - Aortic valve: Trileaflet; moderately thickened, moderately calcified leaflets. - Mitral valve: There was mild regurgitation. - Left atrium: The atrium was moderately dilated. - Right atrium: The atrium was normal in size. - Tricuspid valve: There was mild regurgitation. - Pulmonary arteries: Systolic pressure was mildly increased. PA peak pressure: 37 mm Hg (S). Impressions: - Compared to the prior study, there has been no significant interval change.  CATH:  11/21/2016  Prox RCA lesion, 100 %stenosed.  Ost LAD lesion, 45 %stenosed.  3rd Mrg lesion, 80 %stenosed.  Mid Cx to Dist Cx lesion, 100 %stenosed.  Lat 3rd Mrg lesion, 50 %stenosed. Multivessel CAD: The LAD had ostial 40 to less than 50% focal stenosis. There were mild luminal irregularities in the LAD, which extended to the apex but otherwise was without additional significant obstructive disease. 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 The OM1 vessel had 80% stenosis before giving rise to a branch and then had 50% stenosis in the inferior limb. The AV groove circumflex was 99/100% occluded immediately after the large aneurysmal segment and there was faint filling of a very large  an additional aneurysmal segment with thrombus and very faint filling of the distal posterolateral branches. There was faint collateralization of the distal posterolateral branches via the inferior branch of the obtuse marginal vessel. The RCA was totally occluded proximally as had been previously demonstrated and there were faint bridging collaterals supplying the mid RCA which was a nondominant vessel distally. VEDP 18 mm Hg. RECOMMENDATION: Angiographic findings were reviewed with Dr. Tamala Julian. 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 His RCA was totally occluded at that time. His RCA is essentially unchanged. His AV groove circumflex is now occluded, which most likely is the culprit lesion. The patient had been inadequately been taking his anticoagulation. Heparin will be resumed tonight with plans to resume oral anticoagulation. Initially, the patient will be treated with increasing medical therapy. He is completely  pain-free. He will be started on nitrates, in addition to increased beta blocker therapy. He is not a candidate for stenting due to the significant aneurysmal dilatations throughout his vessels. If he develops increasing symptomatology, consider possible PTCA and/or evaluation for surgical revascularization.   Recent Labs: 11/20/2016: Magnesium 1.9; TSH 1.287 11/22/2016: ALT 20; BUN 17; Creatinine, Ser 1.29; Hemoglobin 12.6; Platelets 182; Potassium 3.9; Sodium 141    Lipid Panel    Component Value Date/Time   CHOL 124 11/21/2016 0551   TRIG 97 11/21/2016 0551   HDL 31 (L) 11/21/2016 0551   CHOLHDL 4.0 11/21/2016 0551   VLDL 19 11/21/2016 0551   LDLCALC 74 11/21/2016 0551     Wt Readings from Last 3 Encounters:  12/29/16 221 lb (100.2 kg)  12/07/16 223 lb 9.6 oz (101.4 kg)  11/22/16 230 lb 8 oz (104.6 kg)     Other studies Reviewed: Additional studies/ records that were reviewed today include: Office notes, hospital records and testing.  ASSESSMENT AND PLAN:  1.  Persistent atrial fibrillation: His cardioversion is set up for next week. He understands that he has to get labs done on Monday morning in order that they will be to see them at the hospital on Tuesday. He understands not to miss a dose of his Eliquis. He assures me that he has not. Continue metoprolol.  2. Acute on chronic diastolic CHF: He has missed a couple of doses of Lasix. He is getting excess sodium and the Gatorade if nothing else. He is encouraged to be compliant with a 2000 mg sodium diet. Continue daily weights. Drink water at between 1 and 1.5 L daily. Take Lasix 40 mg daily. Get labs done on Monday. Hopefully his lower extremity edema and respiratory status will improve social he is stable for cardioversion on Tuesday.  3. Chronic anticoagulation: The patient states he has not missed any doses of Eliquis.  4. CAD: He is not having any ischemic symptoms. Continue current therapy.   Current medicines are  reviewed at length with the patient today.  The patient does not have concerns regarding medicines.  The following changes have been made:  no change  Labs/ tests ordered today include:  No orders of the defined types were placed in this encounter.   Disposition:   FU with Dr. Percival Spanish  Signed, Rosaria Ferries, PA-C  12/29/2016 4:00 PM    [place] Phone: [phone]; Fax: [phone]  This note was written with the assistance of speech recognition software. Please excuse any transcriptional errors.

## 2016-12-29 NOTE — Patient Instructions (Addendum)
Medication Instructions:   YOU HAVE BEEN INSTRUCTED TO  NOT  MISS ANY  DOSES OF YOUR LASIX   If you need a refill on your cardiac medications before your next appointment, please call your pharmacy.  Labwork: BMET AND CBC  ON Monday     Testing/Procedures: NONE ORDERED  TODAY    Follow-Up: KEEP SCHEDULED CARDIOVERSION   Any Other Special Instructions Will Be Listed Below (If Applicable).  YOU  HAVE BEEN  RECOMMENDED TO DRINK 1 TO 1 AND HALF LITERS OF WATER DAILY   WEIGH YOUR SELF DAILY CONTACT OFFICE IF YOU  HAVE A WEIGHT GAIN OF  3 LBS IN  24 HOURS OR 5 LBS                                                                                                                                       Low-Sodium Eating Plan Sodium raises blood pressure and causes water to be held in the body. Getting less sodium from food will help lower your blood pressure, reduce any swelling, and protect your heart, liver, and kidneys. We get sodium by adding salt (sodium chloride) to food. Most of our sodium comes from canned, boxed, and frozen foods. Restaurant foods, fast foods, and pizza are also very high in sodium. Even if you take medicine to lower your blood pressure or to reduce fluid in your body, getting less sodium from your food is important. What is my plan? Most people should limit their sodium intake to 2,300 mg a day. Your health care provider recommends that you limit your sodium intake to __________ a day. What do I need to know about this eating plan? For the low-sodium eating plan, you will follow these general guidelines:  Choose foods with a % Daily Value for sodium of less than 5% (as listed on the food label).  Use salt-free seasonings or herbs instead of table salt or sea salt.  Check with your health care provider or pharmacist before using salt substitutes.  Eat fresh foods.  Eat more vegetables and fruits.  Limit canned vegetables. If you do use them, rinse them well to  decrease the sodium.  Limit cheese to 1 oz (28 g) per day.  Eat lower-sodium products, often labeled as "lower sodium" or "no salt added."  Avoid foods that contain monosodium glutamate (MSG). MSG is sometimes added to [place] food and some canned foods.  Check food labels (Nutrition Facts labels) on foods to learn how much sodium is in one serving.  Eat more home-cooked food and less restaurant, buffet, and fast food.  When eating at a restaurant, ask that your food be prepared with less salt, or no salt if possible. How do I read food labels for sodium information? The Nutrition Facts label lists the amount of sodium in one serving of the food. If you eat more than one serving, you must multiply the listed  amount of sodium by the number of servings. Food labels may also identify foods as:  Sodium free-Less than 5 mg in a serving.  Very low sodium-35 mg or less in a serving.  Low sodium-140 mg or less in a serving.  Light in sodium-50% less sodium in a serving. For example, if a food that usually has 300 mg of sodium is changed to become light in sodium, it will have 150 mg of sodium.  Reduced sodium-25% less sodium in a serving. For example, if a food that usually has 400 mg of sodium is changed to reduced sodium, it will have 300 mg of sodium. What foods can I eat? Grains  Low-sodium cereals, including oats, puffed wheat and rice, and shredded wheat cereals. Low-sodium crackers. Unsalted rice and pasta. Lower-sodium bread. Vegetables  Frozen or fresh vegetables. Low-sodium or reduced-sodium canned vegetables. Low-sodium or reduced-sodium tomato sauce and paste. Low-sodium or reduced-sodium tomato and vegetable juices. Fruits  Fresh, frozen, and canned fruit. Fruit juice. Meat and Other Protein Products  Low-sodium canned tuna and salmon. Fresh or frozen meat, poultry, seafood, and fish. Lamb. Unsalted nuts. Dried beans, peas, and lentils without added salt. Unsalted canned beans.  Homemade soups without salt. Eggs. Dairy  Milk. Soy milk. Ricotta cheese. Low-sodium or reduced-sodium cheeses. Yogurt. Condiments  Fresh and dried herbs and spices. Salt-free seasonings. Onion and garlic powders. Low-sodium varieties of mustard and ketchup. Fresh or refrigerated horseradish. Lemon juice. Fats and Oils  Reduced-sodium salad dressings. Unsalted butter. Other  Unsalted popcorn and pretzels. The items listed above may not be a complete list of recommended foods or beverages. Contact your dietitian for more options.  What foods are not recommended? Grains  Instant hot cereals. Bread stuffing, pancake, and biscuit mixes. Croutons. Seasoned rice or pasta mixes. Noodle soup cups. Boxed or frozen macaroni and cheese. Self-rising flour. Regular salted crackers. Vegetables  Regular canned vegetables. Regular canned tomato sauce and paste. Regular tomato and vegetable juices. Frozen vegetables in sauces. Salted [place] fries. Olives. Angie Fava. Relishes. Sauerkraut. Salsa. Meat and Other Protein Products  Salted, canned, smoked, spiced, or pickled meats, seafood, or fish. Bacon, ham, sausage, hot dogs, corned beef, chipped beef, and packaged luncheon meats. Salt pork. Jerky. Pickled herring. Anchovies, regular canned tuna, and sardines. Salted nuts. Dairy  Processed cheese and cheese spreads. Cheese curds. Blue cheese and cottage cheese. Buttermilk. Condiments  Onion and garlic salt, seasoned salt, table salt, and sea salt. Canned and packaged gravies. Worcestershire sauce. Tartar sauce. Barbecue sauce. Teriyaki sauce. Soy sauce, including reduced sodium. Steak sauce. Fish sauce. Oyster sauce. Cocktail sauce. Horseradish that you find on the shelf. Regular ketchup and mustard. Meat flavorings and tenderizers. Bouillon cubes. Hot sauce. Tabasco sauce. Marinades. Taco seasonings. Relishes. Fats and Oils  Regular salad dressings. Salted butter. Margarine. Ghee. Bacon fat. Other  Potato and  tortilla chips. Corn chips and puffs. Salted popcorn and pretzels. Canned or dried soups. Pizza. Frozen entrees and pot pies. The items listed above may not be a complete list of foods and beverages to avoid. Contact your dietitian for more information.  This information is not intended to replace advice given to you by your health care provider. Make sure you discuss any questions you have with your health care provider. Document Released: 04/21/2002 Document Revised: 04/06/2016 Document Reviewed: 09/03/2013 Elsevier Interactive Patient Education  2017 [employer].

## 2017-01-01 ENCOUNTER — Other Ambulatory Visit: Payer: Self-pay

## 2017-01-01 ENCOUNTER — Telehealth: Payer: Self-pay | Admitting: Cardiovascular Disease

## 2017-01-01 DIAGNOSIS — I10 Essential (primary) hypertension: Secondary | ICD-10-CM | POA: Diagnosis not present

## 2017-01-01 DIAGNOSIS — I4891 Unspecified atrial fibrillation: Secondary | ICD-10-CM | POA: Diagnosis not present

## 2017-01-01 DIAGNOSIS — N289 Disorder of kidney and ureter, unspecified: Secondary | ICD-10-CM

## 2017-01-01 LAB — CBC WITH DIFFERENTIAL/PLATELET
BASOS ABS: 94 {cells}/uL (ref 0–200)
Basophils Relative: 1 %
Eosinophils Absolute: 188 cells/uL (ref 15–500)
Eosinophils Relative: 2 %
HCT: 42.4 % (ref 38.5–50.0)
HEMOGLOBIN: 13.6 g/dL (ref 13.2–17.1)
LYMPHS ABS: 1974 {cells}/uL (ref 850–3900)
Lymphocytes Relative: 21 %
MCH: 27.8 pg (ref 27.0–33.0)
MCHC: 32.1 g/dL (ref 32.0–36.0)
MCV: 86.7 fL (ref 80.0–100.0)
MONO ABS: 1316 {cells}/uL — AB (ref 200–950)
MPV: 10 fL (ref 7.5–12.5)
Monocytes Relative: 14 %
NEUTROS PCT: 62 %
Neutro Abs: 5828 cells/uL (ref 1500–7800)
Platelets: 256 10*3/uL (ref 140–400)
RBC: 4.89 MIL/uL (ref 4.20–5.80)
RDW: 15.2 % — ABNORMAL HIGH (ref 11.0–15.0)
WBC: 9.4 10*3/uL (ref 3.8–10.8)

## 2017-01-01 LAB — BASIC METABOLIC PANEL
BUN: 34 mg/dL — AB (ref 7–25)
CO2: 27 mmol/L (ref 20–31)
Calcium: 8.8 mg/dL (ref 8.6–10.3)
Chloride: 104 mmol/L (ref 98–110)
Creat: 1.77 mg/dL — ABNORMAL HIGH (ref 0.70–1.18)
GLUCOSE: 108 mg/dL — AB (ref 65–99)
POTASSIUM: 3.9 mmol/L (ref 3.5–5.3)
Sodium: 140 mmol/L (ref 135–146)

## 2017-01-01 NOTE — Telephone Encounter (Signed)
Left message for pt to call me

## 2017-01-01 NOTE — Telephone Encounter (Signed)
New Message     Pt can not have cardio version done 01/02/17 he is at the hospital with his mother, wcb to reschedule next week   Please call to confirm you got this message

## 2017-01-01 NOTE — Telephone Encounter (Signed)
Called spoke to wife to see when patient would be available next week for cardioversion.  Wife states patient had an emergency and is unable to have cardioversion tomorrow 01/02/17.  Wife states she will call back tomorrow , she will know a specific day of availability. Will route to scheduler and extender , primary cardiologist  Called an cancelled cardioversion with endoscopy - spoke to Autumn

## 2017-01-02 ENCOUNTER — Encounter (HOSPITAL_COMMUNITY): Admission: RE | Payer: Self-pay | Source: Ambulatory Visit

## 2017-01-02 ENCOUNTER — Ambulatory Visit (HOSPITAL_COMMUNITY): Admission: RE | Admit: 2017-01-02 | Payer: Medicare Other | Source: Ambulatory Visit | Admitting: Cardiovascular Disease

## 2017-01-02 SURGERY — CARDIOVERSION
Anesthesia: Monitor Anesthesia Care

## 2017-01-03 NOTE — Telephone Encounter (Signed)
Spoke to patient. Note that he actually had needed to cancel the cardioversion due to family emergency. He's voiced he's ready to get this rescheduled now, and aware I have sent a msg to schedulers to help arrange this.  His BUN, Cr were elevated at last check (34 and 1.77). Discussed w patient -- he's not noting any LE swelling, SOB, etc. VSS. Pt thinks he is a little dehydrated, had not been drinking a lot of fluids prior to labs. He is going to increase fluid intake. I advised to monitor for LE swelling, call if he notes this. For now advised to stay on same dose of lasix, unsure if he needs to hold this or reduce dose - will seek clarification from provider. Will see if/when labwork recheck advised.  Pt voiced thanks for call.  Routed to Dr. Oval Linsey to address.

## 2017-01-03 NOTE — Telephone Encounter (Signed)
-----   Message from Lonn Georgia, PA-C sent at 01/01/2017  4:03 PM EST ----- Patient of Dr Percival Spanish He needs to hold his Lasix tomorrow. (For DCCV at the hospital) His lymphocytes are a little elevated on the CBC, follow up with primary MD for this. Thanks

## 2017-01-03 NOTE — Telephone Encounter (Signed)
This is an established Dr Percival Spanish patient that Dr Oval Linsey was only scheduled to do cardioversion  Will forward to Dr Stanford Breed DOD since Dr Percival Spanish out of the office

## 2017-01-03 NOTE — Telephone Encounter (Signed)
Change lasix to 20 mg daily with additional 20 mg daily PRN increasing edema or dyspnea; BMET one week with results to Dr Percival Spanish. Kirk Ruths

## 2017-01-04 NOTE — Telephone Encounter (Signed)
Advised patient of change, verbalized understanding Stated he will go to [place] next Thursday or Friday (3/1 or 3/2)

## 2017-01-12 NOTE — Telephone Encounter (Signed)
Spoke with patient during his last visit at the office. He apologized to [facility] for his behavior on that date. Informed him we would void the NS fee as a one time courtesy. Pt understood.

## 2017-02-01 ENCOUNTER — Telehealth (HOSPITAL_COMMUNITY): Payer: Self-pay | Admitting: Internal Medicine

## 2017-02-01 NOTE — Telephone Encounter (Signed)
I called and spoke to patient about scheduling and participating in the Cardiac Rehab program. Patient stated that he currently has a part-time job and will contact me when he is ready to schedule. Patient given my contact information.

## 2017-02-12 ENCOUNTER — Other Ambulatory Visit: Payer: Self-pay | Admitting: Internal Medicine

## 2017-03-26 ENCOUNTER — Telehealth: Payer: Self-pay | Admitting: Physician Assistant

## 2017-03-29 NOTE — Telephone Encounter (Signed)
close encounter °

## 2017-04-02 ENCOUNTER — Encounter: Payer: Self-pay | Admitting: Physician Assistant

## 2017-04-02 ENCOUNTER — Ambulatory Visit (INDEPENDENT_AMBULATORY_CARE_PROVIDER_SITE_OTHER): Payer: Medicare Other | Admitting: Physician Assistant

## 2017-04-02 VITALS — BP 122/70 | HR 52 | Ht 71.0 in | Wt 201.0 lb

## 2017-04-02 DIAGNOSIS — Z7901 Long term (current) use of anticoagulants: Secondary | ICD-10-CM | POA: Diagnosis not present

## 2017-04-02 DIAGNOSIS — I10 Essential (primary) hypertension: Secondary | ICD-10-CM

## 2017-04-02 DIAGNOSIS — I4819 Other persistent atrial fibrillation: Secondary | ICD-10-CM

## 2017-04-02 DIAGNOSIS — I2510 Atherosclerotic heart disease of native coronary artery without angina pectoris: Secondary | ICD-10-CM

## 2017-04-02 DIAGNOSIS — I5032 Chronic diastolic (congestive) heart failure: Secondary | ICD-10-CM

## 2017-04-02 DIAGNOSIS — I481 Persistent atrial fibrillation: Secondary | ICD-10-CM

## 2017-04-02 DIAGNOSIS — Z79899 Other long term (current) drug therapy: Secondary | ICD-10-CM | POA: Diagnosis not present

## 2017-04-02 DIAGNOSIS — I4891 Unspecified atrial fibrillation: Secondary | ICD-10-CM | POA: Diagnosis not present

## 2017-04-02 MED ORDER — METOPROLOL TARTRATE 50 MG PO TABS: 50.0000 mg | ORAL_TABLET | Freq: Two times a day (BID) | ORAL | 11 refills | Status: DC

## 2017-04-02 NOTE — Progress Notes (Signed)
Cardiology Office Note   Date:  04/02/2017   ID:  Sly Parlee, DOB 31-Jul-1946, MRN [id number]  PCP:  Hoyt Koch, MD  Cardiologist:  Dr Percival Spanish 08/29/2016 and in hospital 11/2016 Lenoard Aden 12/29/2016  Chief Complaint  Patient presents with  . Follow-up    3 month f/u    History of Present Illness: Adam Harris is a 71 y.o. male with a history of 100% RCA & CFX, PAF on Eliquis, HTN, EF 55% by echo 11/2016  01/25 office visit and beta blocker increased, follow-up in 3 weeks to schedule cardioversion, Tribenzor restarted at half dose, CXR with mild CHF, small effusions, Lasix started at 20 mg a day, BMET in one week 02/03 phone note describing shortness of breath, Lasix increased to 40 mg daily, BMET had not been done 02/14 phone note describing ongoing dyspnea on exertion and feeling cold all the time 02/16 Office visit, pt w/ volume overload and Na indiscretion, Lasix and Na compliance emphasized. Pt in afib. Pt had been scheduled for DCCV, canceled this due to family emergency  Adam Harris presents for cardiology follow up.  He has been feeling well. No pain. He has lost weight, has been watching what he eats. He has been working at the [employer]. He is tired after he works 8 hours on his feet, but not in an unusual way. He is not exercising.   He has had some LE edema when not taking Lasix, but that has improved. He is taking Lasix 20 mg qd, takes 2 tabs occasionally when needed for edema. He denies Dyspnea on exertion, orthopnea, or PND. He has some lower extremity edema after being on his feet all day, but does not wake with it.  He has not had palpitations. He does not know when he converted to SR, but started noticing HR in the 50s over a week ago. He has not had chest pain with exertion or at rest.    Past Medical History:  Diagnosis Date  . Arthritis   . CAD (coronary artery disease)    Occluded RCA 12/2013; NSTEMI 11/2016 w/ RCA & CFX 100%, med rx. If  that does not work, PTCA or CABG  . Hypertension   . Persistent atrial fibrillation ([place]) 11/2016    Past Surgical History:  Procedure Laterality Date  . CARDIAC CATHETERIZATION N/A 11/21/2016   Procedure: Left Heart Cath and Coronary Angiography;  Surgeon: Troy Sine, MD;  Location: [facility];  Service: Cardiovascular;  Laterality: N/A;  . KNEE ARTHROSCOPY    . LEFT HEART CATHETERIZATION WITH CORONARY ANGIOGRAM N/A 02/04/2014   Procedure: LEFT HEART CATHETERIZATION WITH CORONARY ANGIOGRAM;  Surgeon: [name], MD;  Location: [facility];  Service: Cardiovascular;  Laterality: N/A;    Current Outpatient Prescriptions  Medication Sig Dispense Refill  . apixaban (ELIQUIS) 5 MG TABS tablet Take 1 tablet (5 mg total) by mouth 2 (two) times daily. 60 tablet 5  . aspirin EC 81 MG EC tablet Take 1 tablet (81 mg total) by mouth daily.    Marland Kitchen atorvastatin (LIPITOR) 80 MG tablet Take 1 tablet (80 mg total) by mouth every evening. 30 tablet 11  . furosemide (LASIX) 20 MG tablet Take 20 mg by mouth daily. AND AN EXTRA TABLET AS NEEDED FOR SWELLING OR SOB    . isosorbide mononitrate (IMDUR) 30 MG 24 hr tablet Take 1 tablet (30 mg total) by mouth daily. 60 tablet 11  . metFORMIN (GLUCOPHAGE)  500 MG tablet TAKE 1 TABLET BY MOUTH TWICE A DAY WITH MEALS 60 tablet 5  . metoprolol succinate (TOPROL-XL) 100 MG 24 hr tablet Take 1 tablet (100 mg total) by mouth daily. 60 tablet 6  . nitroGLYCERIN (NITROSTAT) 0.4 MG SL tablet Place 1 tablet under the tongue every 5 minutes times 3 doses as needed for chest pains, call 911 if 2nd dose does not help 25 tablet 6  . Olmesartan-Amlodipine-HCTZ (TRIBENZOR) 40-10-12.5 MG TABS Take 0.5 tablets by mouth daily. 30 tablet 6   No current facility-administered medications for this visit.     Allergies:   Xarelto [rivaroxaban]    Social History:  The patient  reports that he has never smoked. He has never used smokeless tobacco. He reports that he does  not drink alcohol or use drugs.   Family History:  The patient's family history includes Hypertension in his mother.    ROS:  Please see the history of present illness. All other systems are reviewed and negative.    PHYSICAL EXAM: VS:  BP 122/70 (BP Location: Right Arm, Patient Position: Sitting, Cuff Size: Normal)   Pulse (!) 52   Ht 5\' 11"  (1.803 m)   Wt 201 lb (91.2 kg)   BMI 28.03 kg/m  , BMI Body mass index is 28.03 kg/m. GEN: Well nourished, well developed, male in no acute distress  HEENT: normal for age  Neck: no JVD, no carotid bruit, no masses Cardiac: RRR; no murmur, no rubs, or gallops Respiratory:  clear to auscultation bilaterally, normal work of breathing GI: soft, nontender, nondistended, + BS MS: no deformity or atrophy; no edema; distal pulses are 2+ in all 4 extremities   Skin: warm and dry, no rash Neuro:  Strength and sensation are intact Psych: euthymic mood, full affect   EKG:  EKG is ordered today. The ekg ordered today demonstrates sinus bradycardia, heart rate 52, no acute ischemic changes   Recent Labs: 11/20/2016: Magnesium 1.9; TSH 1.287 11/22/2016: ALT 20 01/01/2017: BUN 34; Creat 1.77; Hemoglobin 13.6; Platelets 256; Potassium 3.9; Sodium 140    Lipid Panel    Component Value Date/Time   CHOL 124 11/21/2016 0551   TRIG 97 11/21/2016 0551   HDL 31 (L) 11/21/2016 0551   CHOLHDL 4.0 11/21/2016 0551   VLDL 19 11/21/2016 0551   LDLCALC 74 11/21/2016 0551     Wt Readings from Last 3 Encounters:  04/02/17 201 lb (91.2 kg)  12/29/16 221 lb (100.2 kg)  12/07/16 223 lb 9.6 oz (101.4 kg)     Other studies Reviewed: Additional studies/ records that were reviewed today include: office notes, hospital records and testing.  ASSESSMENT AND PLAN:  1.  Persistent atrial fibrillation:He was in atrial fibrillation oral prolonged period of time. However, he has converted spontaneously to sinus rhythm. Continue his beta blocker. For cost savings, he  wishes to change to Lopressor which we will do with no dose change.  2. Chronic anticoagulation: He is not having any bleeding issues on the Eliquis, continue this  3. CAD: He feels the Imdur is helping him quite a bit. He is on aspirin, a beta blocker, and a statin as well. I advised that we could increase the Imdur if needed for symptoms, but he does not feel this is needed at this time. He is encouraged to continue to increase his activity and let us know if he has any symptoms.  4. Hypertension: His blood pressure is well controlled on current medications.  5.  Chronic diastolic CHF: He has been monitoring his weight carefully and has no evidence of volume overload by exam. He is currently taking Lasix 20 mg daily. We will check a BMET.   Current medicines are reviewed at length with the patient today.  The patient has concerns regarding medicines. Concerns were addressed  The following changes have been made:  Change Toprol-X to Lopressor  Labs/ tests ordered today include:   Orders Placed This Encounter  Procedures  . Basic Metabolic Panel (BMET)  . EKG 12-Lead     Disposition:   FU with Dr. Percival Spanish  Signed, Rosaria Ferries, PA-C  04/02/2017 4:41 PM    [place] Phone: [phone]; Fax: [phone]  This note was written with the assistance of speech recognition software. Please excuse any transcriptional errors.

## 2017-04-02 NOTE — Patient Instructions (Addendum)
Medication Instructions:  STOP TOPROL XL START METOPROLOL TARTRATE 50MG  TWICE DAILY  If you need a refill on your cardiac medications before your next appointment, please call your pharmacy.  Labwork: BMP TODAY HERE IN OUR OFFICE AT [facility]  Follow-Up: Your physician wants you to follow-up in: 3 MONTHS WITH DR [facility]   Special Instructions: CONTINUE DAILY WEIGHTS  FOLLOW 2,000MG  DAILY SODIUM DIET (SEE BELOW)  WE WILL CALL YOU IF KIDNEY FUNCTION/LAB IS ABNORMAL  Thank you for choosing [facility] at [facility]!!     DASH Eating Plan LOW SODIUM DIET 2,000MG  SODIUM DAILY DASH stands for "Dietary Approaches to Stop Hypertension." The DASH eating plan is a healthy eating plan that has been shown to reduce high blood pressure (hypertension). It may also reduce your risk for type 2 diabetes, heart disease, and stroke. The DASH eating plan may also help with weight loss. What are tips for following this plan? General guidelines   Avoid eating more than 2,300 mg (milligrams) of salt (sodium) a day. If you have hypertension, you may need to reduce your sodium intake to 1,500 mg a day.  Limit alcohol intake to no more than 1 drink a day for nonpregnant women and 2 drinks a day for men. One drink equals 12 oz of beer, 5 oz of wine, or 1 oz of hard liquor.  Work with your health care provider to maintain a healthy body weight or to lose weight. Ask what an ideal weight is for you.  Get at least 30 minutes of exercise that causes your heart to beat faster (aerobic exercise) most days of the week. Activities may include walking, swimming, or biking.  Work with your health care provider or diet and nutrition specialist (dietitian) to adjust your eating plan to your individual calorie needs. Reading food labels   Check food labels for the amount of sodium per serving. Choose foods with less than 5 percent of the Daily Value of sodium. Generally, foods with less than 300 mg of sodium  per serving fit into this eating plan.  To find whole grains, look for the word "whole" as the first word in the ingredient list. Shopping   Buy products labeled as "low-sodium" or "no salt added."  Buy fresh foods. Avoid canned foods and premade or frozen meals. Cooking   Avoid adding salt when cooking. Use salt-free seasonings or herbs instead of table salt or sea salt. Check with your health care provider or pharmacist before using salt substitutes.  Do not fry foods. Cook foods using healthy methods such as baking, boiling, grilling, and broiling instead.  Cook with heart-healthy oils, such as olive, canola, soybean, or sunflower oil. Meal planning    Eat a balanced diet that includes:  5 or more servings of fruits and vegetables each day. At each meal, try to fill half of your plate with fruits and vegetables.  Up to 6-8 servings of whole grains each day.  Less than 6 oz of lean meat, poultry, or fish each day. A 3-oz serving of meat is about the same size as a deck of cards. One egg equals 1 oz.  2 servings of low-fat dairy each day.  A serving of nuts, seeds, or beans 5 times each week.  Heart-healthy fats. Healthy fats called Omega-3 fatty acids are found in foods such as flaxseeds and coldwater fish, like sardines, salmon, and mackerel.  Limit how much you eat of the following:  Canned or prepackaged foods.  Food that is high  in trans fat, such as fried foods.  Food that is high in saturated fat, such as fatty meat.  Sweets, desserts, sugary drinks, and other foods with added sugar.  Full-fat dairy products.  Do not salt foods before eating.  Try to eat at least 2 vegetarian meals each week.  Eat more home-cooked food and less restaurant, buffet, and fast food.  When eating at a restaurant, ask that your food be prepared with less salt or no salt, if possible. What foods are recommended? The items listed may not be a complete list. Talk with your dietitian  about what dietary choices are best for you. Grains  Whole-grain or whole-wheat bread. Whole-grain or whole-wheat pasta. Brown rice. Modena Morrow. Bulgur. Whole-grain and low-sodium cereals. Pita bread. Low-fat, low-sodium crackers. Whole-wheat flour tortillas. Vegetables  Fresh or frozen vegetables (raw, steamed, roasted, or grilled). Low-sodium or reduced-sodium tomato and vegetable juice. Low-sodium or reduced-sodium tomato sauce and tomato paste. Low-sodium or reduced-sodium canned vegetables. Fruits  All fresh, dried, or frozen fruit. Canned fruit in natural juice (without added sugar). Meat and other protein foods  Skinless chicken or [place]. Ground chicken or [place]. Pork with fat trimmed off. Fish and seafood. Egg whites. Dried beans, peas, or lentils. Unsalted nuts, nut butters, and seeds. Unsalted canned beans. Lean cuts of beef with fat trimmed off. Low-sodium, lean deli meat. Dairy  Low-fat (1%) or fat-free (skim) milk. Fat-free, low-fat, or reduced-fat cheeses. Nonfat, low-sodium ricotta or cottage cheese. Low-fat or nonfat yogurt. Low-fat, low-sodium cheese. Fats and oils  Soft margarine without trans fats. Vegetable oil. Low-fat, reduced-fat, or light mayonnaise and salad dressings (reduced-sodium). Canola, safflower, olive, soybean, and sunflower oils. Avocado. Seasoning and other foods  Herbs. Spices. Seasoning mixes without salt. Unsalted popcorn and pretzels. Fat-free sweets. What foods are not recommended? The items listed may not be a complete list. Talk with your dietitian about what dietary choices are best for you. Grains  Baked goods made with fat, such as croissants, muffins, or some breads. Dry pasta or rice meal packs. Vegetables  Creamed or fried vegetables. Vegetables in a cheese sauce. Regular canned vegetables (not low-sodium or reduced-sodium). Regular canned tomato sauce and paste (not low-sodium or reduced-sodium). Regular tomato and vegetable juice (not  low-sodium or reduced-sodium). Angie Fava. Olives. Fruits  Canned fruit in a light or heavy syrup. Fried fruit. Fruit in cream or butter sauce. Meat and other protein foods  Fatty cuts of meat. Ribs. Fried meat. Berniece Salines. Sausage. Bologna and other processed lunch meats. Salami. Fatback. Hotdogs. Bratwurst. Salted nuts and seeds. Canned beans with added salt. Canned or smoked fish. Whole eggs or egg yolks. Chicken or [place] with skin. Dairy  Whole or 2% milk, cream, and half-and-half. Whole or full-fat cream cheese. Whole-fat or sweetened yogurt. Full-fat cheese. Nondairy creamers. Whipped toppings. Processed cheese and cheese spreads. Fats and oils  Butter. Stick margarine. Lard. Shortening. Ghee. Bacon fat. Tropical oils, such as coconut, palm kernel, or palm oil. Seasoning and other foods  Salted popcorn and pretzels. Onion salt, garlic salt, seasoned salt, table salt, and sea salt. Worcestershire sauce. Tartar sauce. Barbecue sauce. Teriyaki sauce. Soy sauce, including reduced-sodium. Steak sauce. Canned and packaged gravies. Fish sauce. Oyster sauce. Cocktail sauce. Horseradish that you find on the shelf. Ketchup. Mustard. Meat flavorings and tenderizers. Bouillon cubes. Hot sauce and Tabasco sauce. Premade or packaged marinades. Premade or packaged taco seasonings. Relishes. Regular salad dressings. Where to find more information:  National Heart, Lung, and Blood Institute: https://wilson-eaton.com/  American  Heart Association: www.heart.org Summary  The DASH eating plan is a healthy eating plan that has been shown to reduce high blood pressure (hypertension). It may also reduce your risk for type 2 diabetes, heart disease, and stroke.  With the DASH eating plan, you should limit salt (sodium) intake to 2,300 mg a day. If you have hypertension, you may need to reduce your sodium intake to 1,500 mg a day.  When on the DASH eating plan, aim to eat more fresh fruits and vegetables, whole grains, lean  proteins, low-fat dairy, and heart-healthy fats.  Work with your health care provider or diet and nutrition specialist (dietitian) to adjust your eating plan to your individual calorie needs. This information is not intended to replace advice given to you by your health care provider. Make sure you discuss any questions you have with your health care provider. Document Released: 10/19/2011 Document Revised: 10/23/2016 Document Reviewed: 10/23/2016 Elsevier Interactive Patient Education  2017 [employer].

## 2017-04-03 LAB — BASIC METABOLIC PANEL
BUN / CREAT RATIO: 18 (ref 10–24)
BUN: 22 mg/dL (ref 8–27)
CO2: 24 mmol/L (ref 18–29)
CREATININE: 1.24 mg/dL (ref 0.76–1.27)
Calcium: 9.7 mg/dL (ref 8.6–10.2)
Chloride: 103 mmol/L (ref 96–106)
GFR calc Af Amer: 68 mL/min/{1.73_m2} (ref >59)
GFR calc non Af Amer: 59 mL/min/{1.73_m2} — ABNORMAL LOW (ref >59)
Glucose: 133 mg/dL — ABNORMAL HIGH (ref 65–99)
POTASSIUM: 4.2 mmol/L (ref 3.5–5.2)
SODIUM: 140 mmol/L (ref 134–144)

## 2017-04-16 ENCOUNTER — Other Ambulatory Visit: Payer: Self-pay | Admitting: Cardiology

## 2017-04-16 NOTE — Telephone Encounter (Signed)
Rx request sent to pharmacy.  

## 2017-06-07 DIAGNOSIS — C61 Malignant neoplasm of prostate: Secondary | ICD-10-CM | POA: Diagnosis not present

## 2017-06-12 ENCOUNTER — Other Ambulatory Visit: Payer: Self-pay | Admitting: Pharmacist

## 2017-06-12 MED ORDER — APIXABAN 5 MG PO TABS: 5.0000 mg | ORAL_TABLET | Freq: Two times a day (BID) | ORAL | 5 refills | Status: DC

## 2017-07-02 NOTE — Progress Notes (Signed)
Cardiology Office Note   Date:  07/04/2017   ID:  Adam Harris, Alferd Apa 04-26-1946, MRN [id number]  PCP:  Hoyt Koch, MD  Cardiologist:   Minus Breeding, MD    Chief Complaint  Patient presents with  . Atrial Fibrillation      History of Present Illness: Adam Harris is a 71 y.o. male who presents for atrial fib and CAD.   He was in atrial fibrillation was being considered for cardioversion but he apparently went back in sinus rhythm. He really feel his fibrillation. He actually feels well. He is active working sometimes 8 hours a day part-time weekly at the [employer]. He's lost weight.  The patient denies any new symptoms such as chest discomfort, neck or arm discomfort. There has been no new shortness of breath, PND or orthopnea. There have been no reported palpitations, presyncope or syncope.   Past Medical History:  Diagnosis Date  . Arthritis   . CAD (coronary artery disease)    Occluded RCA 12/2013; NSTEMI 11/2016 w/ RCA & CFX 100%, med rx. If that does not work, PTCA or CABG  . Hypertension   . Persistent atrial fibrillation ([place]) 11/2016    Past Surgical History:  Procedure Laterality Date  . CARDIAC CATHETERIZATION N/A 11/21/2016   Procedure: Left Heart Cath and Coronary Angiography;  Surgeon: Troy Sine, MD;  Location: [facility];  Service: Cardiovascular;  Laterality: N/A;  . KNEE ARTHROSCOPY    . LEFT HEART CATHETERIZATION WITH CORONARY ANGIOGRAM N/A 02/04/2014   Procedure: LEFT HEART CATHETERIZATION WITH CORONARY ANGIOGRAM;  Surgeon: [name], MD;  Location: [facility];  Service: Cardiovascular;  Laterality: N/A;     Current Outpatient Prescriptions  Medication Sig Dispense Refill  . apixaban (ELIQUIS) 5 MG TABS tablet Take 1 tablet (5 mg total) by mouth 2 (two) times daily. 60 tablet 5  . atorvastatin (LIPITOR) 80 MG tablet Take 1 tablet (80 mg total) by mouth every evening. 30 tablet 11  . furosemide (LASIX) 20 MG tablet Take 20 mg by  mouth daily. AND AN EXTRA TABLET AS NEEDED FOR SWELLING OR SOB    . furosemide (LASIX) 20 MG tablet Take 1 tablet (20 mg total) by mouth daily. 30 tablet 3  . isosorbide mononitrate (IMDUR) 30 MG 24 hr tablet Take 1 tablet (30 mg total) by mouth daily. 60 tablet 11  . metFORMIN (GLUCOPHAGE) 500 MG tablet TAKE 1 TABLET BY MOUTH TWICE A DAY WITH MEALS 60 tablet 5  . metoprolol tartrate (LOPRESSOR) 50 MG tablet Take 1 tablet (50 mg total) by mouth 2 (two) times daily. 60 tablet 11  . nitroGLYCERIN (NITROSTAT) 0.4 MG SL tablet Place 1 tablet under the tongue every 5 minutes times 3 doses as needed for chest pains, call 911 if 2nd dose does not help 25 tablet 6  . Olmesartan-Amlodipine-HCTZ (TRIBENZOR) 40-10-12.5 MG TABS Take 0.5 tablets by mouth daily. 30 tablet 6   No current facility-administered medications for this visit.     Allergies:   Xarelto [rivaroxaban]     ROS:  Please see the history of present illness.   Otherwise, review of systems are positive for none.   All other systems are reviewed and negative.    PHYSICAL EXAM: VS:  BP 112/72   Pulse (!) 52   Ht 5\' 11"  (1.803 m)   Wt 200 lb (90.7 kg)   BMI 27.89 kg/m  , BMI Body mass index is 27.89 kg/m.  GENERAL:  Well appearing HEENT:  Pupils equal round and reactive, fundi not visualized, oral mucosa unremarkable NECK:  No jugular venous distention, waveform within normal limits, carotid upstroke brisk and symmetric, no bruits, no thyromegaly LUNGS:  Clear to auscultation bilaterally BACK:  No CVA tenderness CHEST:  Unremarkable HEART:  PMI not displaced or sustained,S1 and S2 within normal limits, no S3, no S4, no clicks, no rubs, no murmurs ABD:  Flat, positive bowel sounds normal in frequency in pitch, no bruits, no rebound, no guarding, no midline pulsatile mass, no hepatomegaly, no splenomegaly EXT:  2 plus pulses throughout, no edema, no cyanosis no clubbing    EKG:  EKG is not ordered today.    Recent  Labs: 11/20/2016: Magnesium 1.9; TSH 1.287 11/22/2016: ALT 20 04/02/2017: BUN 22; Creatinine, Ser 1.24; Potassium 4.2; Sodium 140 07/03/2017: Hemoglobin 13.4; Platelets 227    Lipid Panel    Component Value Date/Time   CHOL 124 11/21/2016 0551   TRIG 97 11/21/2016 0551   HDL 31 (L) 11/21/2016 0551   CHOLHDL 4.0 11/21/2016 0551   VLDL 19 11/21/2016 0551   LDLCALC 74 11/21/2016 0551      Wt Readings from Last 3 Encounters:  07/03/17 200 lb (90.7 kg)  04/02/17 201 lb (91.2 kg)  12/29/16 221 lb (100.2 kg)      Other studies Reviewed: Additional studies/ records that were reviewed today include: None. Review of the above records demonstrates:  Please see elsewhere in the note.     ASSESSMENT AND PLAN:  ATRIAL FIB:  Mr. Antoinne Spadaccini has a CHA2DS2 - VASc score of 3.   He spontaneously converted to NSR.  On exam he is maintaining sinus rhythm. I would like him to continue on Eliquis but he will stop his aspirin. I will check a CBC. He's having no symptoms  CAD:   Occlusive disease managed medically.  He is having no symptoms.  He will continue with risk reduction.   HTN:  The blood pressure is at target. No change in medications is indicated. We will continue with therapeutic lifestyle changes (TLC).  CHRONIC DIASTOLIC HF:  He is euvolemic.  No change in therapy.       Current medicines are reviewed at length with the patient today.  The patient does not have concerns regarding medicines.  The following changes have been made:  As above  Labs/ tests ordered today include:   Orders Placed This Encounter  Procedures  . CBC     Disposition:   FU with Rhonda Barrett in six months.     Signed, Minus Breeding, MD  07/04/2017 9:07 AM    [facility]

## 2017-07-03 ENCOUNTER — Encounter: Payer: Self-pay | Admitting: Cardiology

## 2017-07-03 ENCOUNTER — Ambulatory Visit (INDEPENDENT_AMBULATORY_CARE_PROVIDER_SITE_OTHER): Payer: Medicare Other | Admitting: Cardiology

## 2017-07-03 VITALS — BP 112/72 | HR 52 | Ht 71.0 in | Wt 200.0 lb

## 2017-07-03 DIAGNOSIS — Z79899 Other long term (current) drug therapy: Secondary | ICD-10-CM | POA: Diagnosis not present

## 2017-07-03 DIAGNOSIS — I480 Paroxysmal atrial fibrillation: Secondary | ICD-10-CM

## 2017-07-03 DIAGNOSIS — I2510 Atherosclerotic heart disease of native coronary artery without angina pectoris: Secondary | ICD-10-CM | POA: Diagnosis not present

## 2017-07-03 NOTE — Patient Instructions (Signed)
Medication Instructions:  STOP- Aspirin  Labwork: CBC  Testing/Procedures: None Ordered  Follow-Up: Your physician wants you to follow-up in: 6 Months with Rosaria Ferries. You will receive a reminder letter in the mail two months in advance. If you don't receive a letter, please call our office to schedule the follow-up appointment.   Any Other Special Instructions Will Be Listed Below (If Applicable).   If you need a refill on your cardiac medications before your next appointment, please call your pharmacy.

## 2017-07-04 ENCOUNTER — Encounter: Payer: Self-pay | Admitting: Cardiology

## 2017-07-04 LAB — CBC
HEMATOCRIT: 40.2 % (ref 37.5–51.0)
HEMOGLOBIN: 13.4 g/dL (ref 13.0–17.7)
MCH: 27.2 pg (ref 26.6–33.0)
MCHC: 33.3 g/dL (ref 31.5–35.7)
MCV: 82 fL (ref 79–97)
Platelets: 227 10*3/uL (ref 150–379)
RBC: 4.93 x10E6/uL (ref 4.14–5.80)
RDW: 15.7 % — ABNORMAL HIGH (ref 12.3–15.4)
WBC: 11.7 10*3/uL — ABNORMAL HIGH (ref 3.4–10.8)

## 2017-07-05 ENCOUNTER — Telehealth: Payer: Self-pay | Admitting: Cardiology

## 2017-07-05 NOTE — Telephone Encounter (Signed)
New message     Pt returning Gray call for lab results

## 2017-07-06 NOTE — Telephone Encounter (Signed)
Pt aware of his results

## 2017-07-11 DIAGNOSIS — C61 Malignant neoplasm of prostate: Secondary | ICD-10-CM | POA: Diagnosis not present

## 2017-07-11 DIAGNOSIS — N400 Enlarged prostate without lower urinary tract symptoms: Secondary | ICD-10-CM | POA: Diagnosis not present

## 2017-07-11 LAB — PSA: PSA: 10.8

## 2017-07-13 ENCOUNTER — Encounter: Payer: Self-pay | Admitting: Internal Medicine

## 2017-07-13 LAB — CHG URINALYSIS NONAUTO W/O SCOPE
BILIRUBIN: NEGATIVE
BLOOD: NEGATIVE
GLUCOSE: NEGATIVE
KETONE: NEGATIVE
Leukocytes, UA: NEGATIVE — AB
Nitrites: NEGATIVE
PH: 6.5
Protein: NEGATIVE
SPECIFIC GRAVITY: 1.02
Urobilinogen, UA: NORMAL

## 2017-07-13 NOTE — Progress Notes (Signed)
Abstracted and sent to scan  

## 2017-07-20 ENCOUNTER — Telehealth: Payer: Self-pay | Admitting: Cardiology

## 2017-07-20 NOTE — Telephone Encounter (Signed)
Xanax needs to come from performing MD or primary care.

## 2017-07-20 NOTE — Telephone Encounter (Signed)
New message    Pt wife is calling asking for a call back. She said she has some questions about her husband. Please call. She asked if she could have a call back before 5.

## 2017-07-20 NOTE — Telephone Encounter (Signed)
S/w pt's wife (DPR) she states that pt is having prostate  biopsy, she states that "last time he had this done he went into "shock" sounds like pt had vaso-vagal reaction, cold and clammy, sweating, etc. Wife states that pt is very anxious and he would like xanax or something else to calm pt down for procedure, she says that they do not use anesthesia just a general. She states that she does not want him to have "any heart problems" due to pt being so anxious. Informed wife to call surgeon, she states that they are asking the doctor and could not give her an answer either. Informed wife notified that we do not usually do this, also mentioned that she could call PCP also for this. Verbalizes understanding she will await call back.

## 2017-07-23 NOTE — Telephone Encounter (Signed)
Noted  

## 2017-07-24 DIAGNOSIS — C61 Malignant neoplasm of prostate: Secondary | ICD-10-CM | POA: Diagnosis not present

## 2017-07-31 DIAGNOSIS — C61 Malignant neoplasm of prostate: Secondary | ICD-10-CM | POA: Diagnosis not present

## 2017-08-23 ENCOUNTER — Other Ambulatory Visit: Payer: Self-pay | Admitting: Cardiology

## 2017-09-18 IMAGING — DX DG LUMBAR SPINE COMPLETE 4+V
5 series · 5 of 5 positions shown · non-contrast
Comparison: None.

CLINICAL DATA: Right low back pain for 1 month after stopping would

EXAM:
LUMBAR SPINE - COMPLETE 4+ VIEW

[l-spine ap]
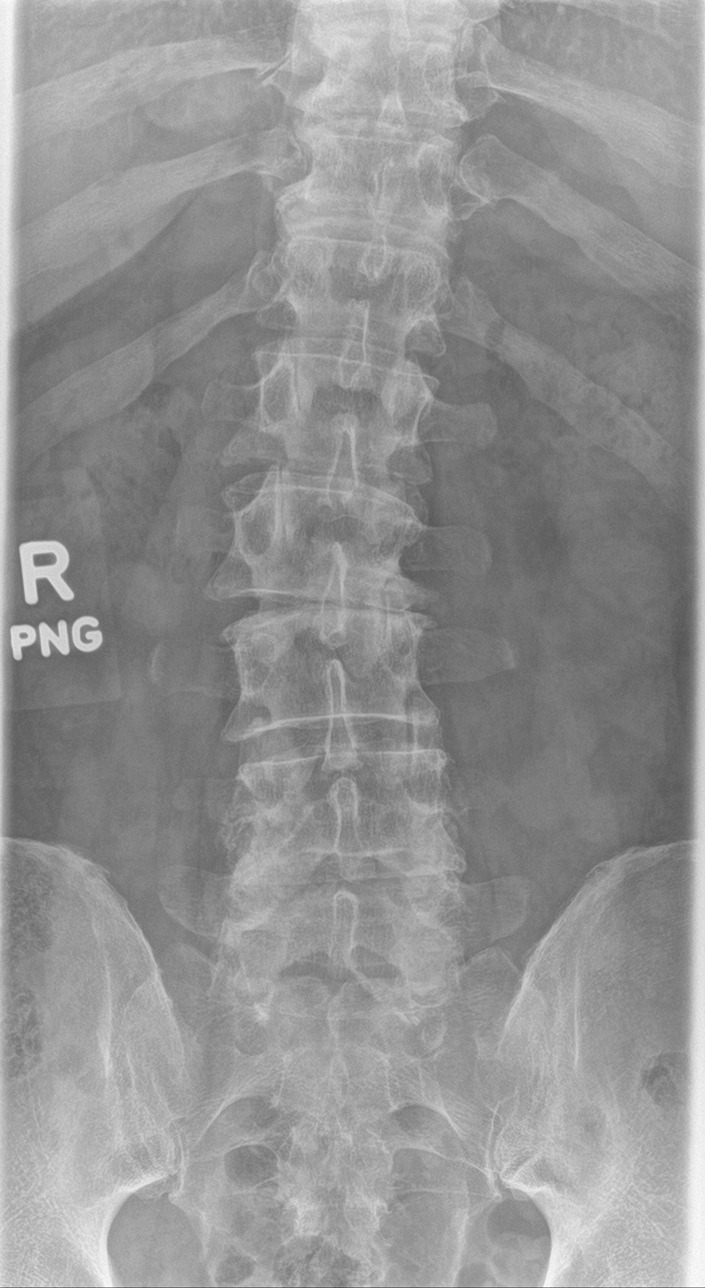

[l-spine obl (1 of 2)]
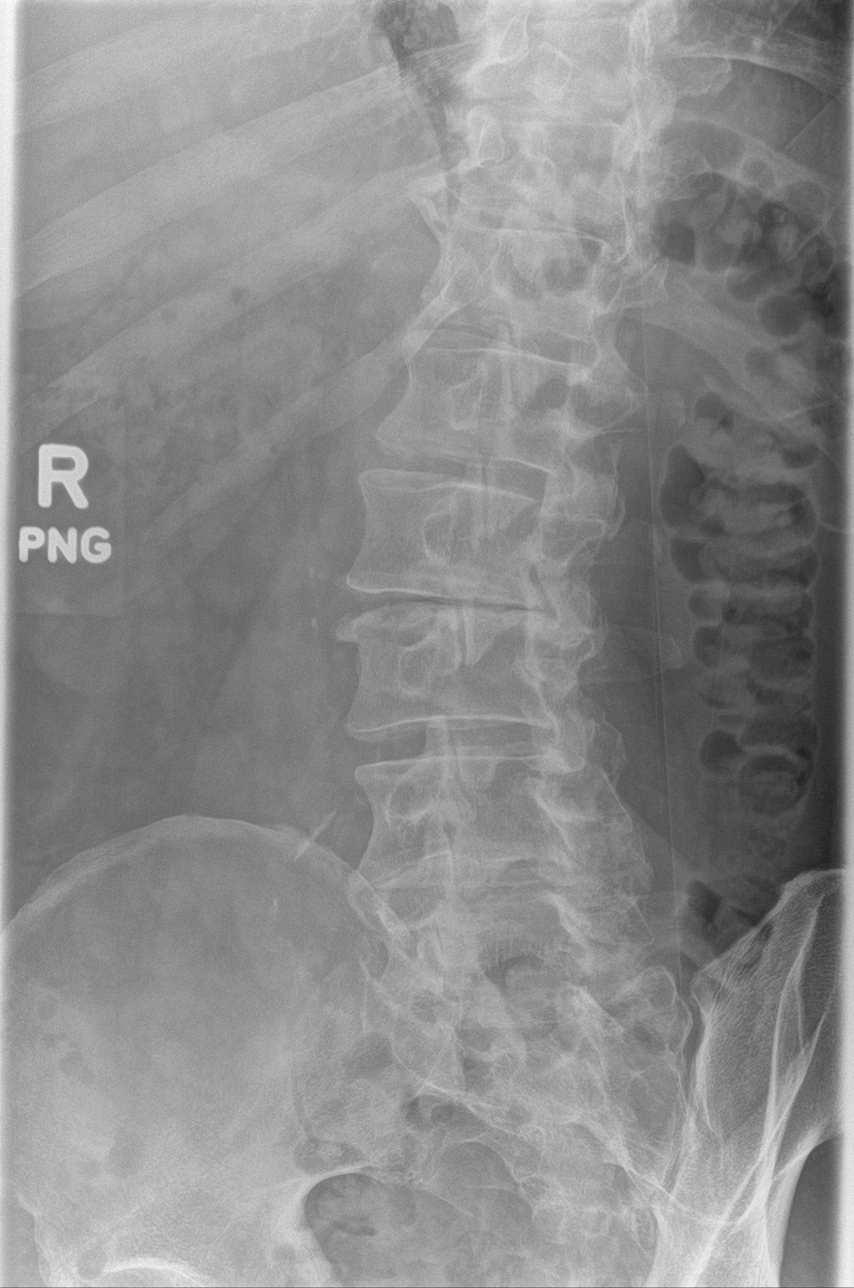

[l-spine obl (2 of 2)]
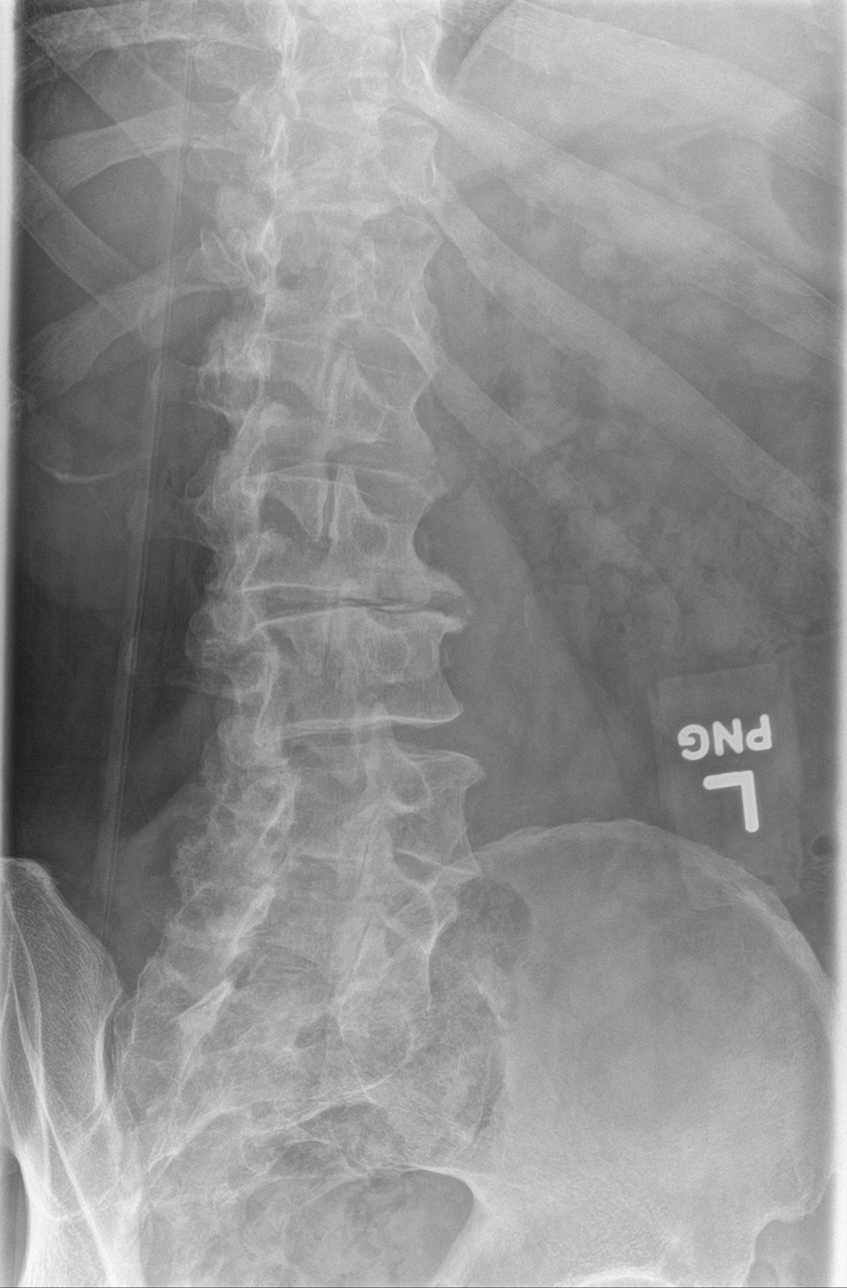

[l-spine lat]
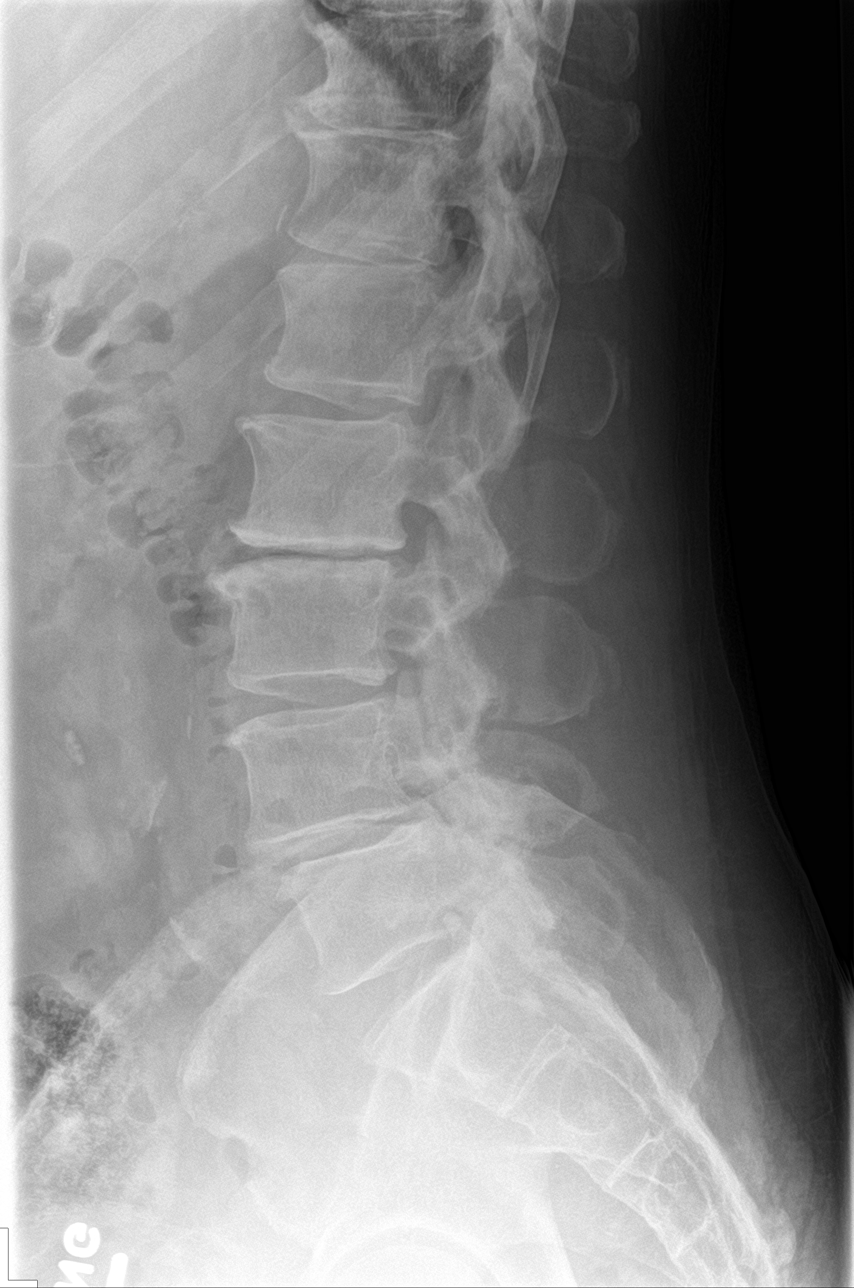

[l-spine spot]
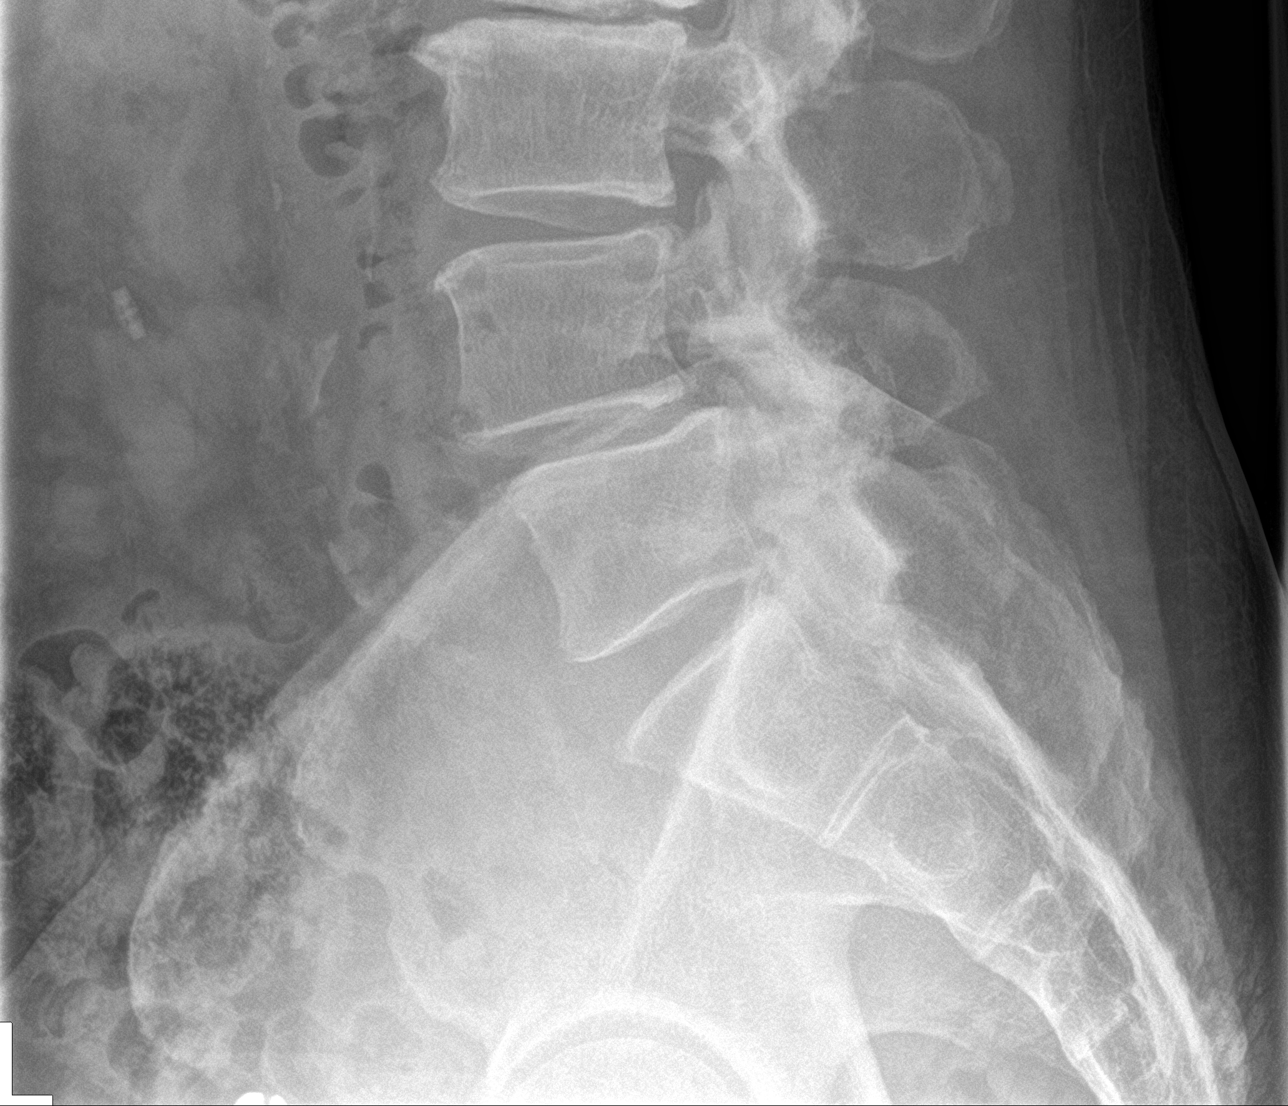

[5 of 5 positions shown; findings below may reference images not displayed]

FINDINGS: There is approximately 4 mm anterolisthesis of L4 on L5 which
appears to be due to degenerative change involving the facet joints.
Mild degenerative disc disease is noted at L4-5 and to a slightly
greater degree at at L2-3 where there is more loss of joint space
and sclerosis with spurring. No compression deformity is seen.
Degenerative change does involve the facet joints of L4-5 and L5-S1.
The SI joints are corticated.
IMPRESSION: 1. 4 mm anterolisthesis of L4 on L5.
2. Degenerative disc disease involving L2-3 and L4-5.
3. Degenerative change of the facet joints of L4-5 and L5-S1.

## 2017-12-04 ENCOUNTER — Other Ambulatory Visit: Payer: Self-pay | Admitting: *Deleted

## 2017-12-04 MED ORDER — ATORVASTATIN CALCIUM 80 MG PO TABS: 80.0000 mg | ORAL_TABLET | Freq: Every evening | ORAL | 11 refills | Status: DC

## 2017-12-14 DIAGNOSIS — Z8619 Personal history of other infectious and parasitic diseases: Secondary | ICD-10-CM

## 2017-12-14 HISTORY — DX: Personal history of other infectious and parasitic diseases: Z86.19

## 2017-12-27 ENCOUNTER — Other Ambulatory Visit: Payer: Self-pay

## 2017-12-27 MED ORDER — ISOSORBIDE MONONITRATE ER 30 MG PO TB24: 30.0000 mg | ORAL_TABLET | Freq: Every day | ORAL | 5 refills | Status: DC

## 2018-01-03 ENCOUNTER — Telehealth: Payer: Self-pay | Admitting: Physician Assistant

## 2018-01-03 NOTE — Telephone Encounter (Signed)
Attempt to return call, no answer and VM full, unable to leave message.

## 2018-01-03 NOTE — Telephone Encounter (Signed)
New Message   Pt c/o of Chest Pain: STAT if CP now or developed within 24 hours  1. Are you having CP right now? No   2. Are you experiencing any other symptoms (ex. SOB, nausea, vomiting, sweating)? No symptoms   3. How long have you been experiencing CP? Past two days  4. Is your CP continuous or coming and going? Coming and going  5. Have you taken Nitroglycerin? Yes, today   Patients wife is calling on his behalf.  ?

## 2018-01-04 NOTE — Telephone Encounter (Signed)
Pt wife called back, she states that pt is not currently having chest pain it has just been happening on and off since pulling a muscle in his back at work. She states that pt was lifting a box at work and pulled a muscle she states that it is on the same side as the pulled muscle and it happens when the pain in his back is hurting so she states that she does not think that this is his "normal" but will keep an eye on it and verbalizes understanding she will go to ER if pain/symptoms recur. Pt has appt schedued 2-27 for evaluation. She will call back if sx return

## 2018-01-04 NOTE — Telephone Encounter (Signed)
Attempted to return call, VM not set up.

## 2018-01-07 DIAGNOSIS — M546 Pain in thoracic spine: Secondary | ICD-10-CM | POA: Diagnosis not present

## 2018-01-07 DIAGNOSIS — R3121 Asymptomatic microscopic hematuria: Secondary | ICD-10-CM | POA: Diagnosis not present

## 2018-01-08 DIAGNOSIS — R3121 Asymptomatic microscopic hematuria: Secondary | ICD-10-CM | POA: Diagnosis not present

## 2018-01-08 DIAGNOSIS — C61 Malignant neoplasm of prostate: Secondary | ICD-10-CM | POA: Diagnosis not present

## 2018-01-08 LAB — PSA: PSA: 1.67

## 2018-01-09 ENCOUNTER — Ambulatory Visit (INDEPENDENT_AMBULATORY_CARE_PROVIDER_SITE_OTHER): Payer: Medicare Other | Admitting: Physician Assistant

## 2018-01-09 ENCOUNTER — Encounter: Payer: Self-pay | Admitting: Physician Assistant

## 2018-01-09 VITALS — BP 104/64 | HR 76 | Temp 98.6°F | Ht 71.0 in | Wt 208.0 lb

## 2018-01-09 DIAGNOSIS — I10 Essential (primary) hypertension: Secondary | ICD-10-CM

## 2018-01-09 DIAGNOSIS — R319 Hematuria, unspecified: Secondary | ICD-10-CM | POA: Diagnosis not present

## 2018-01-09 DIAGNOSIS — B029 Zoster without complications: Secondary | ICD-10-CM

## 2018-01-09 DIAGNOSIS — I480 Paroxysmal atrial fibrillation: Secondary | ICD-10-CM

## 2018-01-09 DIAGNOSIS — I2510 Atherosclerotic heart disease of native coronary artery without angina pectoris: Secondary | ICD-10-CM

## 2018-01-09 DIAGNOSIS — R0789 Other chest pain: Secondary | ICD-10-CM | POA: Diagnosis not present

## 2018-01-09 NOTE — Patient Instructions (Addendum)
Medication Instructions: Your physician recommends that you continue on your current medications as directed. Please refer to the Current Medication list given to you today.  If you need a refill on your cardiac medications before your next appointment, please call your pharmacy.   Labwork: Your provider would like for you to have the following labs today: CBC  Follow-Up: Your physician wants you to follow-up in 6 months with Dr. Percival Spanish. You will receive a reminder letter in the mail two months in advance. If you don't receive a letter, please call our office at [phone] to schedule this follow-up appointment.   Please follow up with your PCP about your rash.   Thank you for choosing [facility]care at [facility]!!

## 2018-01-09 NOTE — Progress Notes (Signed)
Cardiology Office Note    Date:  01/11/2018   ID:  Adam Harris, DOB August 03, 1946, MRN [id number]  PCP:  Hoyt Koch, MD  Cardiologist:  Dr. Percival Spanish  Chief Complaint  Patient presents with  . Follow-up    seen for Dr. Percival Spanish, atypical chest pain    History of Present Illness:  Adam Harris is a 72 y.o. male with PMH of CAD, HTN, and atrial fibrillation.  He has known occluded RCA since February 2015.  He underwent cardiac catheterization for NSTEMI in January 2018, this showed occluded left circumflex and RCA, 80% small OM 3, 45% ostial LAD lesion, medical therapy was recommended.  He is on metoprolol and Eliquis for atrial fibrillation.  He was supposed to undergo DC cardioversion in February 2018, however it was fortunately enough to convert prior to the planned procedure.  Patient presents today for evaluation of chest pain.  He was actually seen in urgent care 3 days ago.  For the past 4 days, he has been having persistent left-sided chest pain radiating from the back to the front.  He says the chest pain does occasionally go away however only briefly, for all purpose, his chest pain is pretty much persistent throughout the day.  On examination, patient had distinctive red rash following the dermatome both on his back and also in his left chest.  There are also blisters as well.  He denies any of blisters on his wife recently.  EKG shows he has went back to into atrial fibrillation.  Otherwise he has been compliant with his blood thinner and rate control therapy.  Given lack of significant discomfort, I will continue him using the rate control strategy for his atrial fibrillation.  However his chest pain is more consistent with shingles.  I have advised him to discuss this with his primary care provider to start on antiviral medication.  EKG showed nonspecific changes without clear evidence of ischemia.  He did complain of recent hematuria, I will obtain a CBC, unless hemoglobin  significantly drops, otherwise I would not recommend to discontinue Eliquis.    Past Medical History:  Diagnosis Date  . Arthritis   . CAD (coronary artery disease)    Occluded RCA 12/2013; NSTEMI 11/2016 w/ RCA & CFX 100%, med rx. If that does not work, PTCA or CABG  . Hypertension   . Persistent atrial fibrillation ([place]) 11/2016    Past Surgical History:  Procedure Laterality Date  . CARDIAC CATHETERIZATION N/A 11/21/2016   Procedure: Left Heart Cath and Coronary Angiography;  Surgeon: Troy Sine, MD;  Location: [facility];  Service: Cardiovascular;  Laterality: N/A;  . KNEE ARTHROSCOPY    . LEFT HEART CATHETERIZATION WITH CORONARY ANGIOGRAM N/A 02/04/2014   Procedure: LEFT HEART CATHETERIZATION WITH CORONARY ANGIOGRAM;  Surgeon: [name], MD;  Location: [facility];  Service: Cardiovascular;  Laterality: N/A;    Current Medications: Outpatient Medications Prior to Visit  Medication Sig Dispense Refill  . apixaban (ELIQUIS) 5 MG TABS tablet Take 1 tablet (5 mg total) by mouth 2 (two) times daily. 60 tablet 5  . atorvastatin (LIPITOR) 80 MG tablet Take 1 tablet (80 mg total) by mouth every evening. 30 tablet 11  . furosemide (LASIX) 20 MG tablet Take 20 mg by mouth daily. AND AN EXTRA TABLET AS NEEDED FOR SWELLING OR SOB    . isosorbide mononitrate (IMDUR) 30 MG 24 hr tablet Take 1 tablet (30 mg total) by mouth daily. 30 tablet  5  . meloxicam (MOBIC) 7.5 MG tablet Take 7.5 mg by mouth daily.    . metFORMIN (GLUCOPHAGE) 500 MG tablet TAKE 1 TABLET BY MOUTH TWICE A DAY WITH MEALS 60 tablet 5  . metoprolol tartrate (LOPRESSOR) 50 MG tablet Take 1 tablet (50 mg total) by mouth 2 (two) times daily. 60 tablet 11  . nitroGLYCERIN (NITROSTAT) 0.4 MG SL tablet Place 1 tablet under the tongue every 5 minutes times 3 doses as needed for chest pains, call 911 if 2nd dose does not help 25 tablet 6  . Olmesartan-Amlodipine-HCTZ (TRIBENZOR) 40-10-12.5 MG TABS Take 0.5 tablets by  mouth daily. 30 tablet 6  . furosemide (LASIX) 20 MG tablet TAKE 1 TABLET (20 MG TOTAL) BY MOUTH DAILY. 30 tablet 3   No facility-administered medications prior to visit.      Allergies:   Xarelto [rivaroxaban]   Social History   Socioeconomic History  . Marital status: Married    Spouse name: None  . Number of children: None  . Years of education: None  . Highest education level: None  Social Needs  . Financial resource strain: None  . Food insecurity - worry: None  . Food insecurity - inability: None  . Transportation needs - medical: None  . Transportation needs - non-medical: None  Occupational History  . None  Tobacco Use  . Smoking status: Never Smoker  . Smokeless tobacco: Never Used  Substance and Sexual Activity  . Alcohol use: No  . Drug use: No  . Sexual activity: None  Other Topics Concern  . None  Social History Narrative  . None     Family History:  The patient's family history includes Hypertension in his mother.   ROS:   Please see the history of present illness.    ROS All other systems reviewed and are negative.   PHYSICAL EXAM:   VS:  BP 104/64   Pulse 76   Temp 98.6 F (37 C) (Oral)   Ht 5\' 11"  (1.803 m)   Wt 208 lb (94.3 kg)   SpO2 98%   BMI 29.01 kg/m    GEN: Well nourished, well developed, in no acute distress  HEENT: normal  Neck: no JVD, carotid bruits, or masses Cardiac: Irregularly irregular; no murmurs, rubs, or gallops,no edema  Respiratory:  clear to auscultation bilaterally, normal work of breathing GI: soft, nontender, nondistended, + BS MS: no deformity or atrophy  Skin: warm and dry, no rash Neuro:  Alert and Oriented x 3, Strength and sensation are intact Psych: euthymic mood, full affect  Wt Readings from Last 3 Encounters:  01/10/18 208 lb (94.3 kg)  01/09/18 208 lb (94.3 kg)  07/03/17 200 lb (90.7 kg)      Studies/Labs Reviewed:   EKG:  EKG is ordered today.  The ekg ordered today demonstrates atrial  fibrillation with nonspecific ST-T wave changes  Recent Labs: 04/02/2017: BUN 22; Creatinine, Ser 1.24; Potassium 4.2; Sodium 140 01/09/2018: Hemoglobin 14.3; Platelets 132   Lipid Panel    Component Value Date/Time   CHOL 124 11/21/2016 0551   TRIG 97 11/21/2016 0551   HDL 31 (L) 11/21/2016 0551   CHOLHDL 4.0 11/21/2016 0551   VLDL 19 11/21/2016 0551   LDLCALC 74 11/21/2016 0551    Additional studies/ records that were reviewed today include:   Cath 11/21/2016 Conclusion     Prox RCA lesion, 100 %stenosed.  Ost LAD lesion, 45 %stenosed.  3rd Mrg lesion, 80 %stenosed.  Mid Cx to  Dist Cx lesion, 100 %stenosed.  Lat 3rd Mrg lesion, 50 %stenosed.   Multivessel CAD: The LAD had ostial 40 to less than 50% focal stenosis.  There were mild luminal irregularities in the LAD, which extended to the apex but otherwise was without additional significant obstructive disease.  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  The OM1 vessel had 80% stenosis before giving rise to a branch and then had 50% stenosis in the inferior limb.  The AV groove circumflex was 99/100% occluded immediately after the large aneurysmal segment and  there was faint filling of a very large an additional aneurysmal segment with thrombus and very faint filling of the distal posterolateral branches.  There was faint collateralization of the distal posterolateral branches via the inferior branch of the obtuse marginal vessel.  The RCA was totally occluded proximally as had been previously demonstrated and there were faint bridging collaterals supplying the mid RCA which was a nondominant vessel distally.  LVEDP 18 mm Hg.  RECOMMENDATION: Angiographic findings were reviewed with Dr. Tamala Julian.  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  His RCA was totally occluded at that time.  His RCA is essentially unchanged.  His AV groove circumflex is now occluded, which most likely is the culprit lesion.  The patient had been inadequately been taking his anticoagulation.  Heparin will be resumed tonight with plans to resume oral anticoagulation.  Initially, the patient will be treated with increasing medical therapy.  He is completely pain-free.  He will be started on nitrates, in addition to increased beta blocker therapy.  He is not a candidate for stenting due to the significant aneurysmal dilatations throughout his vessels.  If he develops increasing symptomatology, consider possible PTCA and/or evaluation for surgical revascularization.      ASSESSMENT:    1. Atypical chest pain   2. Hematuria, unspecified type   3. Essential hypertension   4. Coronary artery disease involving native coronary artery of native heart without angina pectoris   5. Paroxysmal atrial fibrillation (HCC)   6. Herpes zoster without complication      PLAN:  In order of problems listed above:  1. Atypical chest pain: He essentially has been having persistent chest pain for the past 4-5 days, worse with palpation.  When undressed, he has red rash with blisters that following dermatome on his back and the chest.  This is consistent with shingles.  He will need to see his primary care doctor immediately and potentially start on antiviral medication.  He is still very contagious at this point, at this time I did not recommend him to have direct physical contact with other people.  2. CAD: Denies any exertional chest pain.  Not on aspirin given the need of Eliquis.  3. Paroxysmal atrial fibrillation: On Eliquis.  Continue rate control with metoprolol to 50 mg twice daily  4. Shingles: Advised the patient to discuss with his primary care provider immediately to start on  antiviral medication  5. Hematuria: Very rare, I will obtain a CBC, unless hemoglobin significantly drops, I would not recommend discontinue Eliquis.    Medication Adjustments/Labs and Tests Ordered: Current medicines are reviewed at length with the patient today.  Concerns regarding medicines are outlined  above.  Medication changes, Labs and Tests ordered today are listed in the Patient Instructions below. Patient Instructions  Medication Instructions: Your physician recommends that you continue on your current medications as directed. Please refer to the Current Medication list given to you today.  If you need a refill on your cardiac medications before your next appointment, please call your pharmacy.   Labwork: Your provider would like for you to have the following labs today: CBC  Follow-Up: Your physician wants you to follow-up in 6 months with Dr. Percival Spanish. You will receive a reminder letter in the mail two months in advance. If you don't receive a letter, please call our office at [phone] to schedule this follow-up appointment.   Please follow up with your PCP about your rash.   Thank you for choosing [facility]care at [facility], Adam Harris, [place]  01/11/2018 10:50 AM    [address], [place], [place]  [address] Phone: [phone]; Fax: [phone]

## 2018-01-10 ENCOUNTER — Telehealth: Payer: Self-pay

## 2018-01-10 ENCOUNTER — Ambulatory Visit (INDEPENDENT_AMBULATORY_CARE_PROVIDER_SITE_OTHER): Payer: Medicare Other | Admitting: Internal Medicine

## 2018-01-10 ENCOUNTER — Encounter: Payer: Self-pay | Admitting: Internal Medicine

## 2018-01-10 DIAGNOSIS — B029 Zoster without complications: Secondary | ICD-10-CM | POA: Diagnosis not present

## 2018-01-10 LAB — CBC
Hematocrit: 42.1 % (ref 37.5–51.0)
Hemoglobin: 14.3 g/dL (ref 13.0–17.7)
MCH: 28 pg (ref 26.6–33.0)
MCHC: 34 g/dL (ref 31.5–35.7)
MCV: 83 fL (ref 79–97)
PLATELETS: 132 10*3/uL — AB (ref 150–379)
RBC: 5.1 x10E6/uL (ref 4.14–5.80)
RDW: 14.7 % (ref 12.3–15.4)
WBC: 9.5 10*3/uL (ref 3.4–10.8)

## 2018-01-10 MED ORDER — GABAPENTIN 300 MG PO CAPS: 300.0000 mg | ORAL_CAPSULE | Freq: Three times a day (TID) | ORAL | 3 refills | Status: DC

## 2018-01-10 MED ORDER — TRAMADOL HCL 50 MG PO TABS: 50.0000 mg | ORAL_TABLET | Freq: Three times a day (TID) | ORAL | 0 refills | Status: DC | PRN

## 2018-01-10 MED ORDER — VALACYCLOVIR HCL 1 G PO TABS: 1000.0000 mg | ORAL_TABLET | Freq: Three times a day (TID) | ORAL | 0 refills | Status: DC

## 2018-01-10 NOTE — Patient Instructions (Signed)
We have sent in the valtrex to treat the shingles. Take 1 pill 3 times per day for 1 week.  We have sent in gabapentin to use for pain up to 3 times per day. This is for the nerve pain.  If the gabapentin does not work we have also sent in tramadol to use for pain up to 2 times per day.    Shingles Shingles is an infection that causes a painful skin rash and fluid-filled blisters. Shingles is caused by the same virus that causes chickenpox. Shingles only develops in people who:  Have had chickenpox.  Have gotten the chickenpox vaccine. (This is rare.)  The first symptoms of shingles may be itching, tingling, or pain in an area on your skin. A rash will follow in a few days or weeks. The rash is usually on one side of the body in a bandlike or beltlike pattern. Over time, the rash turns into fluid-filled blisters that break open, scab over, and dry up. Medicines may:  Help you manage pain.  Help you recover more quickly.  Help to prevent long-term problems.  Follow these instructions at home: Medicines  Take medicines only as told by your doctor.  Apply an anti-itch or numbing cream to the affected area as told by your doctor. Blister and Rash Care  Take a cool bath or put cool compresses on the area of the rash or blisters as told by your doctor. This may help with pain and itching.  Keep your rash covered with a loose bandage (dressing). Wear loose-fitting clothing.  Keep your rash and blisters clean with mild soap and cool water or as told by your doctor.  Check your rash every day for signs of infection. These include redness, swelling, and pain that lasts or gets worse.  Do not pick your blisters.  Do not scratch your rash. General instructions  Rest as told by your doctor.  Keep all follow-up visits as told by your doctor. This is important.  Until your blisters scab over, your infection can cause chickenpox in people who have never had it or been vaccinated  against it. To prevent this from happening, avoid touching other people or being around other people, especially: ? Babies. ? Pregnant women. ? Children who have eczema. ? Elderly people who have transplants. ? People who have chronic illnesses, such as leukemia or AIDS. Contact a doctor if:  Your pain does not get better with medicine.  Your pain does not get better after the rash heals.  Your rash looks infected. Signs of infection include: ? Redness. ? Swelling. ? Pain that lasts or gets worse. Get help right away if:  The rash is on your face or nose.  You have pain in your face, pain around your eye area, or loss of feeling on one side of your face.  You have ear pain or you have ringing in your ear.  You have loss of taste.  Your condition gets worse. This information is not intended to replace advice given to you by your health care provider. Make sure you discuss any questions you have with your health care provider. Document Released: 04/17/2008 Document Revised: 06/25/2016 Document Reviewed: 08/11/2014 Elsevier Interactive Patient Education  [employer].

## 2018-01-10 NOTE — Assessment & Plan Note (Signed)
Classic rash on exam. Valtrex 1 g TID for 1 week and gabapentin for pain with tramadol in case this is ineffective. Advised about typical course.

## 2018-01-10 NOTE — Telephone Encounter (Signed)
PA started on CoverMyMeds KEY: [id number]

## 2018-01-10 NOTE — Progress Notes (Signed)
   Subjective:    Patient ID: Adam Harris, male    DOB: 03-22-1946, 72 y.o.   MRN: [id number]  HPI The patient is a 72 YO man coming in for chest pain. He saw his cardiologist yesterday and they told him that he had shingles but he should come to [place] for it. He has been suffering with this for about 5-6 days now. Has a red rash where he is having the pain. Some blistering. Overall pain is worsening. He has tried otc pain medications with no relief. Denies fevers or chills.   Review of Systems  Constitutional: Negative.   Respiratory: Negative for cough, chest tightness and shortness of breath.   Cardiovascular: Positive for chest pain. Negative for palpitations and leg swelling.  Gastrointestinal: Negative for abdominal distention, abdominal pain, constipation, diarrhea, nausea and vomiting.  Musculoskeletal: Positive for back pain.  Skin: Positive for rash.  Neurological: Negative.   Psychiatric/Behavioral: Negative.       Objective:   Physical Exam  Constitutional: He is oriented to person, place, and time. He appears well-developed and well-nourished.  HENT:  Head: Normocephalic and atraumatic.  Eyes: EOM are normal.  Neck: Normal range of motion.  Cardiovascular: Normal rate and regular rhythm.  Pulmonary/Chest: Effort normal and breath sounds normal. No respiratory distress. He has no wheezes. He has no rales. He exhibits tenderness.  Classic dermatomal rash on the mid back spreading around the left flank to chest with blistering.   Abdominal: Soft. Bowel sounds are normal. He exhibits no distension. There is no tenderness. There is no rebound.  Musculoskeletal: He exhibits no edema.  Neurological: He is alert and oriented to person, place, and time. Coordination normal.  Skin: Skin is warm and dry.   Vitals:   01/10/18 0757  BP: 110/76  Pulse: 83  Temp: 97.8 F (36.6 C)  TempSrc: Oral  SpO2: 99%  Weight: 208 lb (94.3 kg)  Height: 5\' 11"  (1.803 m)      Assessment & Plan:

## 2018-01-11 ENCOUNTER — Encounter: Payer: Self-pay | Admitting: Physician Assistant

## 2018-01-18 NOTE — Telephone Encounter (Signed)
PA denied.

## 2018-01-24 ENCOUNTER — Ambulatory Visit: Payer: Medicare Other | Admitting: Cardiology

## 2018-02-01 ENCOUNTER — Other Ambulatory Visit: Payer: Self-pay | Admitting: Cardiology

## 2018-02-04 ENCOUNTER — Telehealth: Payer: Self-pay

## 2018-02-04 DIAGNOSIS — R3121 Asymptomatic microscopic hematuria: Secondary | ICD-10-CM | POA: Diagnosis not present

## 2018-02-04 DIAGNOSIS — R3129 Other microscopic hematuria: Secondary | ICD-10-CM | POA: Diagnosis not present

## 2018-02-04 NOTE — Telephone Encounter (Signed)
Cell phone was busy, LVM on home phone to call back to let us know if he knew what medication and dose he was taking that did not work (tramadol or gabapentin).

## 2018-02-04 NOTE — Telephone Encounter (Signed)
What medication is he taking and at what doses?

## 2018-02-04 NOTE — Telephone Encounter (Signed)
Copied from [place] [phone]. Topic: Quick Communication - Office Called Patient >> Feb 04, 2018 10:14 AM Peace, Tammy L wrote: Patient came into the office today.  States Dr. Sharlet Salina gave him gabapentin and tramadol.  States the pharmacy would only let him fill one but he dropped both off.  Patient states he is not sure which one the pharmacy gave him but it is not working and he wants to fill the other.  Patient is to contact the pharnacy in regard and give [place] a call if he needs script.  Please ask what happened to the other script? >> Feb 04, 2018 11:07 AM Robina Ade, Helene Kelp D wrote: Patient called back today and said that he is in pain and would like pain medication called that will be approved by his insurance. Please call patient back, thanks.

## 2018-02-04 NOTE — Telephone Encounter (Signed)
The PA for the tramadol was denied

## 2018-02-08 ENCOUNTER — Other Ambulatory Visit: Payer: Self-pay | Admitting: Internal Medicine

## 2018-02-08 ENCOUNTER — Other Ambulatory Visit: Payer: Self-pay | Admitting: Cardiology

## 2018-02-12 ENCOUNTER — Other Ambulatory Visit: Payer: Self-pay | Admitting: Cardiology

## 2018-02-12 DIAGNOSIS — R3121 Asymptomatic microscopic hematuria: Secondary | ICD-10-CM | POA: Diagnosis not present

## 2018-02-12 DIAGNOSIS — R9341 Abnormal radiologic findings on diagnostic imaging of renal pelvis, ureter, or bladder: Secondary | ICD-10-CM | POA: Diagnosis not present

## 2018-02-12 MED ORDER — METOPROLOL TARTRATE 50 MG PO TABS: 50.0000 mg | ORAL_TABLET | Freq: Two times a day (BID) | ORAL | 11 refills | Status: DC

## 2018-02-12 NOTE — Telephone Encounter (Signed)
Rx(s) sent to pharmacy electronically.  

## 2018-02-13 ENCOUNTER — Other Ambulatory Visit: Payer: Self-pay

## 2018-02-13 NOTE — Telephone Encounter (Signed)
Control database checked last refill: 02/04/2018 LOV: 01/10/2018

## 2018-02-14 ENCOUNTER — Other Ambulatory Visit: Payer: Self-pay | Admitting: Urology

## 2018-02-15 ENCOUNTER — Telehealth: Payer: Self-pay

## 2018-02-15 NOTE — Telephone Encounter (Signed)
   [facility] Pre-operative Risk Assessment    Request for surgical clearance:  1. What type of surgery is being performed? Cyptoscopy, Bilateral Retrograde Pyelogram, Possible Bilateral Ureteroscopy Ureteral  Biopsy and Stent  2. When is this surgery scheduled? 02/22/18   3. What type of clearance is required (medical clearance vs. Pharmacy clearance to hold med vs. Both)? Medicaion  4. Are there any medications that need to be held prior to surgery and how long? Eliquis ( need to hold 3 days prior)  5. Practice name and name of physician performing surgery? [facility] (Dr. Karsten Ro)   6. What is your office phone and fax number? [phone] Fax: [phone]  7. Anesthesia type (None, local, MAC, general) ? General    Meryl Crutch 02/15/2018, 8:11 AM  _________________________________________________________________   (provider comments below)

## 2018-02-18 NOTE — Telephone Encounter (Signed)
Called Patient .  Left detailed message for patient (DPR)w/medication directions and to call dr Simone Curia office for furthr questions Forwarded to requesting providers office via [facility]

## 2018-02-18 NOTE — Telephone Encounter (Signed)
   Primary Cardiologist: Minus Breeding, MD  Chart reviewed as part of pre-operative protocol coverage. Given past medical history and time since last visit, based on ACC/AHA guidelines, Berwyn Bigley would be at acceptable risk for the planned procedure without further cardiovascular testing.   Patient is on Eliquis in the setting of atrial fib. I will forward to Pharmacy for recommendations prior to release.   I will route this recommendation to the requesting party via Epic fax function and remove from pre-op pool.  Please call with questions.  Jory Sims, NP 02/18/2018, 2:12 PM

## 2018-02-18 NOTE — Telephone Encounter (Signed)
Have read and reviewed recommendations from pharmacist. Will route.

## 2018-02-18 NOTE — Telephone Encounter (Signed)
Forwarded to requesting providers office via EIC 

## 2018-02-18 NOTE — Telephone Encounter (Signed)
Patient with diagnosis of ATRIAL FIBRILLATION on ELIQUIS 5MG  for anticoagulation.    Procedure: BILATERAL PYELOGRAM AND CUTOSCOPY Date of procedure: 02/22/18  CHADS2-VASc score of  5 (CHF, HTN, AGE, DM2, CAD); NO HISTORY OF VTE/STROKE OR TIA  CrCl = 58 ML/MIN (WITH IBW)  Per office protocol, patient can hold ELIQUIS for 3 days prior to procedure.     Angela Platner Rodriguez-Guzman PharmD, BCPS, [place] [id number] 02/18/2018 3:36 PM

## 2018-02-20 ENCOUNTER — Encounter: Payer: Self-pay | Admitting: Internal Medicine

## 2018-02-22 ENCOUNTER — Telehealth: Payer: Self-pay | Admitting: Internal Medicine

## 2018-02-22 ENCOUNTER — Other Ambulatory Visit: Payer: Self-pay | Admitting: Internal Medicine

## 2018-02-22 MED ORDER — LIDOCAINE 5 % EX PTCH: 1.0000 | MEDICATED_PATCH | CUTANEOUS | 0 refills | Status: DC

## 2018-02-22 NOTE — Telephone Encounter (Signed)
Patient called and asked about the Valtrex prescription, he says "well the shingles are all scabbed over, it's just this one area that is painful. I don't know if I need this or not, but I definitely need something for the pain." I advised the Lidoderm patches were sent in earlier today to [facility]. He says "I went down there to pick them up and they said they need a prior authorization." I advised this will be sent to the provider for the authorization of the Lidoderm and for approval of the Valtrex. He says "can the Lidoderm be sent to [facility] instead, because [facility] is expensive to buy medications, if it's no trouble." I advised this could be done and a prior authorization will be sent to prevent further delays.

## 2018-02-22 NOTE — Telephone Encounter (Signed)
Copied from [place] [id number]. Topic: Quick Communication - Rx Refill/Question >> Feb 22, 2018  1:58 PM Bea Graff, NT wrote: Medication: valACYclovir (VALTREX) 1000 MG tablet Has the patient contacted their pharmacy? Yes.   (Agent: If no, request that the patient contact the pharmacy for the refill.) Preferred Pharmacy (with phone number or street name): [place], [phone] (Phone) [phone] (Fax)  Agent: Please be advised that RX refills may take up to 3 business days. We ask that you follow-up with your pharmacy.

## 2018-02-22 NOTE — Telephone Encounter (Signed)
Patient informed Lidoderm patches sent in

## 2018-02-22 NOTE — Telephone Encounter (Signed)
What is he taking for it now?

## 2018-02-22 NOTE — Telephone Encounter (Signed)
Sent in lidoderm patches to use for the pain which tends to be good in shingles. If rash is still present needs follow up visit as this was almost 2 months ago now to make sure this is still the problem and no new problems.

## 2018-02-22 NOTE — Telephone Encounter (Signed)
Copied from [place] [id number]. Topic: Quick Communication - See Telephone Encounter >> Feb 22, 2018  8:13 AM Margot Ables wrote: CRM for notification. See Telephone encounter for: 02/22/18. Pt wife called stating he is in constant pain from shingles. The medications that have been prescribed are not helping. Pt is not sleeping because he is hurting so much. She states pt has shown no signs of improvement other than some scabbing on the initial location of shingles. Pt is wanting a stronger medicine so that he is able to function. Please advise.  [place], [place] [address] [phone] (Phone) [phone] (Fax)

## 2018-02-22 NOTE — Telephone Encounter (Signed)
Gabapentin states that he took all those and it did not seem to help   Tramadol moved on to this medication and is taking 1 in the morning and 1 at night  Also states he finished the valacyclovir   States that the pain is worse at night when he tries to sleep states "it feels like a hot poker on my back" is wanting something stronger to help with the pain so he can sleep.

## 2018-02-26 ENCOUNTER — Telehealth: Payer: Self-pay

## 2018-02-26 ENCOUNTER — Telehealth: Payer: Self-pay | Admitting: Cardiology

## 2018-02-26 NOTE — Telephone Encounter (Signed)
New Message:      Request for surgical clearance:  1. What type of surgery is being performed? Cyptoscopy, Bilateral Retrograde Pyelogram, Possible Bilateral Ureteroscopy Ureteral  Biopsy and Stent  2. When is this surgery scheduled? 03/18/18  3. What type of clearance is required (medical clearance vs. Pharmacy clearance to hold med vs. Both)? Medicaion  4. Are there any medications that need to be held prior to surgery and how long? Eliquis ( need to hold 3 days prior)  5. Practice name and name of physician performing surgery? [facility] (Dr. Karsten Ro)   6. What is your office phone and fax number? [phone] Fax: [phone]  7. Anesthesia type (None, local, MAC, general) ? General     Marlowe Kays states she received something back but was told that the pharmacist would have to look at it and get back with them. Marlowe Kays states they need it faxed if it was put in epic.

## 2018-02-26 NOTE — Telephone Encounter (Signed)
PA started on CoverMyMeds KEY: [id number]

## 2018-02-27 NOTE — Telephone Encounter (Signed)
Patient with diagnosis of atrial fibrillation on Eliquis for anticoagulation.    Procedure: cypotscopy, bilateral retrograde pyelogram, possible bilateral ureteroscopy, uretal biopsy and stent Date of procedure: 03/18/18  CHADS2-VASc score of  5 (CHF, HTN, AGE, DM2,, CAD,)  CrCl 58.2 (with IBW) Platelet count 256  Per office protocol, patient can hold Eliquis for 3 days prior to procedure.    Patient should restart Eliquis , 24-72 hrs after procedure at discretion of procedure MD

## 2018-02-27 NOTE — Telephone Encounter (Signed)
There are 2 separate pharmacy recommendations regarding the anticoagulation.  Routing to pharmacy here at [facility] for review.   Burtis Junes, RN, [place] [address] [place], [place]  [phone]

## 2018-03-06 ENCOUNTER — Telehealth: Payer: Self-pay | Admitting: Physician Assistant

## 2018-03-06 MED ORDER — OLMESARTAN-AMLODIPINE-HCTZ 40-10-12.5 MG PO TABS: 0.5000 | ORAL_TABLET | Freq: Every day | ORAL | 6 refills | Status: DC

## 2018-03-06 NOTE — Telephone Encounter (Signed)
New Message   *STAT* If patient is at the pharmacy, call can be transferred to refill team.   1. Which medications need to be refilled? (please list name of each medication and dose if known) Olmesartan-Amlodipine-HCTZ (TRIBENZOR) 40-10-12.5 MG TABS  2. Which pharmacy/location (including street and city if local pharmacy) is medication to be sent to? [place], [place] [employer] [facility]  3. Do they need a 30 day or 90 day supply? [place]

## 2018-03-13 ENCOUNTER — Encounter (HOSPITAL_BASED_OUTPATIENT_CLINIC_OR_DEPARTMENT_OTHER): Payer: Self-pay | Admitting: *Deleted

## 2018-03-15 ENCOUNTER — Encounter (HOSPITAL_BASED_OUTPATIENT_CLINIC_OR_DEPARTMENT_OTHER): Payer: Self-pay

## 2018-03-15 ENCOUNTER — Other Ambulatory Visit: Payer: Self-pay

## 2018-03-15 NOTE — Progress Notes (Signed)
Spoke with: [place] NPO:  After Midnight, no gum, candy, or mints   Arrival time: 0530AM Labs: Istat 8, EKG in epic 01/10/2018 AM medications:  Metoprolol, Isosorbide Pre op orders: Yes Ride home:  Manuela Schwartz (wife) [phone]

## 2018-03-16 NOTE — H&P (Signed)
HPI: Adam Harris is a 72 year-old male With hematuria and abnormal findings on CT of Rt. ureter and Lt. renal pelvis  He did not see the blood in his urine. The blood was first detected 01/07/2018. He has not been told that they had blood in the urine in the past.   He does have a history of smoking. He does not have a history of exposure to chemicals or fumes. He does not have a history of urinary infections. He does not have a burning sensation when he urinates. There is not a history of GU malignancy in the family. There is not a history of calculus disease in the family. He is not having pain. He has not recently had unwanted weight loss.   This condition would be considered of mild to moderate severity with no modifying factors or associated signs or symptoms other than as noted above.   01/08/18: He came in to see me today because he said for a couple of days he has felt bad. He was experiencing pain in his upper back between the shoulder blades or a little bit higher but no flank pain. He has not seen any gross hematuria but was seen yesterday and started on meloxicam. He was also told he had microscopic hematuria and was recommended that he follow up with me.   02/12/18: He returns today for completion of his workup of microscopic hematuria and has not had any new voiding complaints, gross hematuria or pain.     ALLERGIES: No Allergies    MEDICATIONS: Atorvastatin Calcium 20 MG Oral Tablet Oral  Eliquis 5 mg tablet Oral  MetFORMIN HCl - 500 MG Oral Tablet Oral  Metoprolol Tartrate 25 MG Oral Tablet Oral  Nitrostat 0.4 MG Sublingual Tablet Sublingual Sublingual  Tribenzor 40 mg-10 mg-25 mg tablet Oral     GU PSH: Locm 300-399Mg /Ml Iodine,1Ml - 02/04/2018 Prostate Needle Biopsy - 07/24/2017      PSH Notes: Knee Arthroscopy   NON-GU PSH: Surgical Pathology, Gross And Microscopic Examination For Prostate Needle - 07/24/2017    GU PMH: Microscopic hematuria, He was noted to have  microscopic hematuria today. We therefore have discussed the need for further evaluation. He did not appear to have any infection but did have a few white cells so I am going to culture his urine. We will then obtain a creatinine and schedule him for a CT scan to evaluate the upper tract and will then have him return for lower tract evaluation with cystoscopy. - 01/08/2018 Prostate Cancer (Stable), His prostate was noted to be entirely benign today. I will obtain a PSA today since he has due for a recheck. - 01/08/2018, (Stable), I went over his pathology report with him today which has revealed no evidence of grade or stage progression of his low risk prostate cancer. My recommendation to him has been continued active surveillance and he has agreed with this plan., - 07/31/2017 (Stable), I have discussed with the patient the possibility of blood per rectum, per urethra and in the ejaculate. He was counseled to contact me if he has any difficulties following his prostate biopsy whatsoever., - 07/24/2017 (Stable), His prostate was noted to be smooth and benign to examination however his PSA has risen significantly. It was 8.3 when we did his biopsy initially and then came down quite low. I recommended we repeat the PSA today but also I told him this time we proceed with a repeat biopsy., - 07/11/2017 (Stable), His prostate remains benign  on his examination and his PSA continues to remain low and stable. 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 - 07/11/2017 BPH w/LUTS (Stable), He has some BPH by exam with nocturia 2 but he said it is not significant enough that he would want to consider any form of pharmacologic therapy at this time. - 08/23/2016, Benign prostatic hyperplasia with urinary obstruction, -  2016 Nocturia (Stable), His nocturia is not a significant bother to him. 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  Treatment: None currently   Left hydrocele: At one point he found it to be a bother but he says it really not giving him any trouble other than occasionally when he crosses his legs. It is not increasing in size.     NON-GU PMH: Encounter for general adult medical examination without abnormal findings, Encounter for preventive health examination - 2017 Atherosclerotic heart disease of native coronary artery without angina pectoris, CAD (coronary artery disease) - 2016 Personal history of other diseases of the circulatory system, History of atrial fibrillation - 2016, History of angina pectoris, - 2016, History of hypertension, - 2014 Personal history of other endocrine, nutritional and metabolic disease, History of hypercholesterolemia - 2016, History of type 2 diabetes mellitus, - 2016 Stenosis of anus and rectum, Rectal stricture - 2016    FAMILY HISTORY: Family Health Status - Mother's Age - Runs In Family Family Health Status Number - Runs In Family Father Deceased At [place] ___ - Runs In Family Hypertension - Mother   SOCIAL HISTORY: Marital Status: Married Preferred Language: English; Race: White Has never drank.  Drinks 3 caffeinated drinks per day.     Notes: Former smoker, Occupation:, Marital History - Currently Married, Caffeine Use, Alcohol Use   REVIEW OF SYSTEMS:    GU Review Male:   Patient denies frequent urination, hard to postpone urination, burning/ pain with urination, get up at night to urinate, leakage of urine, stream starts and stops, trouble starting your stream, have to strain to urinate , erection problems, and penile pain.  Gastrointestinal (Upper):   Patient denies nausea,  vomiting, and indigestion/ heartburn.  Gastrointestinal (Lower):   Patient denies diarrhea and constipation.  Constitutional:   Patient denies fever, night sweats, weight loss, and fatigue.  Skin:   Patient denies skin rash/ lesion and itching.  Eyes:   Patient denies blurred vision and double vision.  Ears/ Nose/ Throat:   Patient denies sore throat and sinus problems.  Hematologic/Lymphatic:   Patient denies swollen glands and easy bruising.  Cardiovascular:   Patient denies leg swelling and chest pains.  Respiratory:   Patient denies cough and shortness of breath.  Endocrine:   Patient denies excessive thirst.  Musculoskeletal:   Patient denies back pain and joint pain.  Neurological:   Patient denies headaches and dizziness.  Psychologic:   Patient denies depression and anxiety.   VITAL SIGNS:    Weight 211 lb / 95.71 kg  Height 71 in / 180.34 cm  BP 142/94 mmHg  Pulse 71 /min  BMI 29.4 kg/m   Physical Exam  Constitutional: Well nourished and well developed . No acute distress.  ENT:. The ears and nose are normal in appearance.   Neck: The appearance of the neck is normal and no neck mass is present.   Pulmonary: No respiratory distress and normal respiratory rhythm and effort.   Cardiovascular: Heart rate and rhythm are normal . No peripheral edema.   Abdomen: The abdomen is soft and nontender. No masses are palpated. No CVA tenderness. No hernias are palpable. No hepatosplenomegaly noted.   Rectal: Rectal exam demonstrates normal sphincter tone, no tenderness and no masses. Estimated prostate size is 1+. He has a mild rectal stricture. The prostate has no nodularity and is not tender. The left seminal vesicle is nonpalpable. The right seminal vesicle is nonpalpable. The perineum is normal on inspection.   Genitourinary: Examination of the penis demonstrates no discharge, no masses, no lesions and a normal meatus. The penis is uncircumcised. The scrotum is without lesions.  Examination of the left scrotum demostrates a hydrocele. The right epididymis is palpably normal and non-tender. The left epididymis is palpably normal and non-tender. The right testis is non-tender and without masses. The left testis is non-tender and without masses.   Lymphatics: The femoral and inguinal nodes are not enlarged or tender.   Skin: Normal skin turgor, no visible rash and no visible skin lesions.   Neuro/Psych:. Mood and affect are appropriate.     PAST DATA REVIEWED:  Source Of History:  Patient  Lab Test Review:   BUN/Creatinine  Records Review:   Previous Patient Records, POC Tool  X-Ray Review: C.T. Hematuria: Reviewed Films. Reviewed Report. Discussed With Patient. EXAM: CT ABDOMEN AND PELVIS WITHOUT AND WITH CONTRAST TECHNIQUE: Multidetector CT imaging of the abdomen and pelvis was performed following the standard protocol before and following the bolus administration of intravenous contrast. CONTRAST: 125 cc of Isovue 300 COMPARISON: None FINDINGS: Lower chest: Mild interstitial thickening identified within the lung bases. Scattered small calcified and noncalcified nodules are identified within both lung bases. No pleural effusion. Hepatobiliary: No focal liver abnormality is seen. No gallstones, gallbladder wall thickening, or biliary dilatation. Pancreas: Unremarkable. No pancreatic ductal dilatation or surrounding inflammatory changes. Spleen: Normal in size without focal abnormality. Adrenals/Urinary Tract: Normal adrenal glands. There is mild right hydroureter with ureteral enhancement to the level of the urinary bladder. No urinary tract calculi or focal lesion identified. Unremarkable appearance of the left kidney. The urinary bladder appears normal. Stomach/Bowel: The stomach appears normal. The small bowel loops have a normal course and caliber. No obstruction. The appendix is visualized and appears normal. No pathologic dilatation of the colon. Vascular/Lymphatic: Aortic  atherosclerosis. No aneurysm. Left periaortic node is upper limits of normal in size measuring 1 cm, image 42/9. No pelvic or inguinal adenopathy. Reproductive: Prostate gland measures 4.6 by 4.5 by 5.3 cm, (volume = 57 cm^3) image 87/601. Right-sided hydrocele is noted, image 98/9 Other: No ascites or focal fluid collections. Musculoskeletal: Degenerative disc disease identified within the lumbar spine. IMPRESSION: 1. No urinary tract calculi or focal urinary tract lesion identified. 2. There is mild right hydroureter with ureteral enhancement which may be the sequelae of recently passed calculus or infection. 3. Mild prostate gland enlargement. 4. Right hydrocele. 5. Aortic Atherosclerosis (ICD10-I70.0). 6. Calcified and noncalcified nodules are identified within both lung bases. Findings may be the sequelae of prior granulomatous disease. Consider further evaluation with nonemergent CT of the chest. 7. Borderline enlarged left retroperitoneal lymph node measures 1 cm.     01/08/18 07/11/17 06/07/17 07/19/16 12/14/15 05/28/15 04/21/14 10/07/13  PSA  Total PSA 1.67 ng/mL 4.28 ng/mL 10.80 ng/mL 1.92 ng/dl 1.68  3.03  2.71  2.69     01/08/18  General Chemistry  Creatinine 1.3 mg/dL  Notes:                     His creatinine was noted to be 1.3.   PROCEDURES:         Flexible Cystoscopy - 52000  Risks, benefits, and some of the potential complications of the procedure were discussed at length with the patient including infection, bleeding, voiding discomfort, urinary retention, fever, chills, sepsis, and others. All questions were answered. Informed consent was obtained. Sterile technique and 2% Lidocaine intraurethral analgesia were used.  Meatus:  Normal size. Normal location. Normal condition.  Urethra:  No strictures.  External Sphincter:  Normal.  Verumontanum:  Normal.  Prostate:  Borderline obstructing. Mild hyperplasia.  Bladder Neck:  Non-obstructing.  Ureteral Orifices:  Normal location.  Normal size. Normal shape. Effluxed clear urine.  Bladder:  No trabeculation. No tumors. Normal mucosa. No stones.      The lower urinary tract was carefully examined. The procedure was well-tolerated and without complications. Instructions were given to call the office immediately for bloody urine, difficulty urinating, urinary retention, painful or frequent urination, fever or other illness. The patient stated that he understood these instructions and would comply with them.   ASSESSMENT/PLAN:      ICD-10 Details  1 GU:   Abnormal radiologic findings on diagnostic imaging of of renal pelvis, ureter, or bladder - R93.41 Bilateral, Due to the finding of abnormalities of both his right ureter and left renal pelvic region we are going to proceed with further evaluation with cystoscopy, bilateral retrograde pyelography, bilateral ureteroscopy and possible bilateral double-J stent placement.  2   Microscopic hematuria - R31.21 Stable - He had no abnormality of the lower track on cystoscopy today.          Notes:   I discussed with the patient the findings I noted on his CT scan which has revealed, according to the radiologist, some dilation of the right ureter with enhancement of the ureteral wall down to the level of the bladder. I do appreciate this however I am more concerned by what appears to be a possible filling defect in the area of the renal pelvis at the level of the UPJ on the left-hand side. That is the side where the single slightly enlarged lymph node is noted as well.   His left hydrocele was noted as well.

## 2018-03-17 ENCOUNTER — Encounter (HOSPITAL_BASED_OUTPATIENT_CLINIC_OR_DEPARTMENT_OTHER): Payer: Self-pay | Admitting: Anesthesiology

## 2018-03-17 NOTE — Anesthesia Preprocedure Evaluation (Addendum)
Anesthesia Evaluation  Patient identified by MRN, date of birth, ID band Patient awake    Reviewed: Allergy & Precautions, NPO status , Patient's Chart, lab work & pertinent test results  Airway Mallampati: II       Dental  (+) Edentulous Upper, Edentulous Lower   Pulmonary neg pulmonary ROS,    Pulmonary exam normal breath sounds clear to auscultation       Cardiovascular hypertension, Pt. on home beta blockers and Pt. on medications + angina + CAD and + Past MI   Rhythm:Irregular Rate:Normal     Neuro/Psych negative neurological ROS  negative psych ROS   GI/Hepatic negative GI ROS, Neg liver ROS,   Endo/Other  diabetes, Type 2, Oral Hypoglycemic Agents  Renal/GU      Musculoskeletal   Abdominal Normal abdominal exam  (+)   Peds  Hematology negative hematology ROS (+)   Anesthesia Other Findings   Reproductive/Obstetrics                           Anesthesia Physical Anesthesia Plan  ASA: III  Anesthesia Plan: General   Post-op Pain Management:    Induction: Intravenous  PONV Risk Score and Plan: 3 and Ondansetron  Airway Management Planned: LMA  Additional Equipment:   Intra-op Plan:   Post-operative Plan:   Informed Consent: I have reviewed the patients History and Physical, chart, labs and discussed the procedure including the risks, benefits and alternatives for the proposed anesthesia with the patient or authorized representative who has indicated his/her understanding and acceptance.   Dental advisory given  Plan Discussed with: CRNA and Surgeon  Anesthesia Plan Comments:        Anesthesia Quick Evaluation

## 2018-03-18 ENCOUNTER — Encounter (HOSPITAL_BASED_OUTPATIENT_CLINIC_OR_DEPARTMENT_OTHER)
Admission: RE | Disposition: A | Discharge status: Home / Self Care | Payer: Self-pay | Source: Ambulatory Visit | Attending: Urology

## 2018-03-18 ENCOUNTER — Ambulatory Visit (HOSPITAL_BASED_OUTPATIENT_CLINIC_OR_DEPARTMENT_OTHER): Payer: Medicare Other | Admitting: Anesthesiology

## 2018-03-18 ENCOUNTER — Encounter (HOSPITAL_BASED_OUTPATIENT_CLINIC_OR_DEPARTMENT_OTHER): Payer: Self-pay | Admitting: *Deleted

## 2018-03-18 ENCOUNTER — Ambulatory Visit (HOSPITAL_COMMUNITY)
Admission: RE | Admit: 2018-03-18 | Discharge: 2018-03-18 | Disposition: A | Discharge status: Home / Self Care | Payer: Medicare Other | Source: Ambulatory Visit | Attending: Urology | Admitting: Urology

## 2018-03-18 ENCOUNTER — Other Ambulatory Visit: Payer: Self-pay

## 2018-03-18 DIAGNOSIS — E7800 Pure hypercholesterolemia, unspecified: Secondary | ICD-10-CM | POA: Diagnosis not present

## 2018-03-18 DIAGNOSIS — Z87891 Personal history of nicotine dependence: Secondary | ICD-10-CM | POA: Diagnosis not present

## 2018-03-18 DIAGNOSIS — Z79899 Other long term (current) drug therapy: Secondary | ICD-10-CM | POA: Insufficient documentation

## 2018-03-18 DIAGNOSIS — N433 Hydrocele, unspecified: Secondary | ICD-10-CM | POA: Insufficient documentation

## 2018-03-18 DIAGNOSIS — Z7901 Long term (current) use of anticoagulants: Secondary | ICD-10-CM | POA: Insufficient documentation

## 2018-03-18 DIAGNOSIS — E119 Type 2 diabetes mellitus without complications: Secondary | ICD-10-CM | POA: Diagnosis not present

## 2018-03-18 DIAGNOSIS — Z7984 Long term (current) use of oral hypoglycemic drugs: Secondary | ICD-10-CM | POA: Diagnosis not present

## 2018-03-18 DIAGNOSIS — N35819 Other urethral stricture, male, unspecified site: Secondary | ICD-10-CM | POA: Diagnosis not present

## 2018-03-18 DIAGNOSIS — N529 Male erectile dysfunction, unspecified: Secondary | ICD-10-CM | POA: Diagnosis not present

## 2018-03-18 DIAGNOSIS — R3129 Other microscopic hematuria: Secondary | ICD-10-CM | POA: Insufficient documentation

## 2018-03-18 DIAGNOSIS — I252 Old myocardial infarction: Secondary | ICD-10-CM | POA: Insufficient documentation

## 2018-03-18 DIAGNOSIS — R896 Abnormal cytological findings in specimens from other organs, systems and tissues: Secondary | ICD-10-CM | POA: Diagnosis not present

## 2018-03-18 DIAGNOSIS — R3121 Asymptomatic microscopic hematuria: Secondary | ICD-10-CM | POA: Diagnosis not present

## 2018-03-18 DIAGNOSIS — I10 Essential (primary) hypertension: Secondary | ICD-10-CM | POA: Insufficient documentation

## 2018-03-18 DIAGNOSIS — I2510 Atherosclerotic heart disease of native coronary artery without angina pectoris: Secondary | ICD-10-CM | POA: Diagnosis not present

## 2018-03-18 DIAGNOSIS — R9389 Abnormal findings on diagnostic imaging of other specified body structures: Secondary | ICD-10-CM | POA: Diagnosis not present

## 2018-03-18 DIAGNOSIS — I4891 Unspecified atrial fibrillation: Secondary | ICD-10-CM | POA: Insufficient documentation

## 2018-03-18 DIAGNOSIS — R9341 Abnormal radiologic findings on diagnostic imaging of renal pelvis, ureter, or bladder: Secondary | ICD-10-CM

## 2018-03-18 DIAGNOSIS — N99115 Postprocedural fossa navicularis urethral stricture: Secondary | ICD-10-CM | POA: Diagnosis not present

## 2018-03-18 DIAGNOSIS — Z8546 Personal history of malignant neoplasm of prostate: Secondary | ICD-10-CM | POA: Insufficient documentation

## 2018-03-18 DIAGNOSIS — R93421 Abnormal radiologic findings on diagnostic imaging of right kidney: Secondary | ICD-10-CM | POA: Diagnosis present

## 2018-03-18 HISTORY — DX: Type 2 diabetes mellitus without complications: E11.9

## 2018-03-18 HISTORY — DX: Old myocardial infarction: I25.2

## 2018-03-18 HISTORY — DX: Long term (current) use of anticoagulants: Z79.01

## 2018-03-18 HISTORY — DX: Other forms of dyspnea: R06.09

## 2018-03-18 HISTORY — DX: Chronic kidney disease, stage 3 (moderate): N18.3

## 2018-03-18 HISTORY — DX: Hematuria, unspecified: R31.9

## 2018-03-18 HISTORY — DX: Acute myocardial infarction, unspecified: I21.9

## 2018-03-18 HISTORY — DX: Personal history of other infectious and parasitic diseases: Z86.19

## 2018-03-18 HISTORY — PX: CYSTOSCOPY/RETROGRADE/URETEROSCOPY: SHX5316

## 2018-03-18 HISTORY — DX: Abnormal radiologic findings on diagnostic imaging of renal pelvis, ureter, or bladder: R93.41

## 2018-03-18 HISTORY — DX: Dyspnea, unspecified: R06.00

## 2018-03-18 HISTORY — DX: Chronic kidney disease, stage 3 unspecified: N18.30

## 2018-03-18 LAB — POCT I-STAT, CHEM 8
BUN: 27 mg/dL — ABNORMAL HIGH (ref 6–20)
Calcium, Ion: 1.25 mmol/L (ref 1.15–1.40)
Chloride: 105 mmol/L (ref 101–111)
Creatinine, Ser: 1.3 mg/dL — ABNORMAL HIGH (ref 0.61–1.24)
Glucose, Bld: 118 mg/dL — ABNORMAL HIGH (ref 65–99)
HCT: 38 % — ABNORMAL LOW (ref 39.0–52.0)
Hemoglobin: 12.9 g/dL — ABNORMAL LOW (ref 13.0–17.0)
Potassium: 3.8 mmol/L (ref 3.5–5.1)
Sodium: 141 mmol/L (ref 135–145)
TCO2: 25 mmol/L (ref 22–32)

## 2018-03-18 LAB — GLUCOSE, CAPILLARY: Glucose-Capillary: 131 mg/dL — ABNORMAL HIGH (ref 65–99)

## 2018-03-18 SURGERY — CYSTOSCOPY/RETROGRADE/URETEROSCOPY
Anesthesia: General | Site: Ureter | Laterality: Bilateral

## 2018-03-18 MED ORDER — LIDOCAINE HCL (CARDIAC) PF 100 MG/5ML IV SOSY
PREFILLED_SYRINGE | INTRAVENOUS | Status: DC | PRN
  Administered 2018-03-18: 20 mg via INTRAVENOUS

## 2018-03-18 MED ORDER — OXYCODONE HCL 5 MG/5ML PO SOLN
5.0000 mg | Freq: Once | ORAL | Status: DC | PRN
  Filled 2018-03-18: qty 5

## 2018-03-18 MED ORDER — FENTANYL CITRATE (PF) 100 MCG/2ML IJ SOLN
25.0000 ug | INTRAMUSCULAR | Status: DC | PRN
  Administered 2018-03-18 (×2): 25 ug via INTRAVENOUS
  Administered 2018-03-18: 50 ug via INTRAVENOUS
  Filled 2018-03-18: qty 1

## 2018-03-18 MED ORDER — PHENAZOPYRIDINE HCL 100 MG PO TABS
ORAL_TABLET | ORAL | Status: AC
  Filled 2018-03-18: qty 2

## 2018-03-18 MED ORDER — FENTANYL CITRATE (PF) 100 MCG/2ML IJ SOLN
INTRAMUSCULAR | Status: AC
  Filled 2018-03-18: qty 2

## 2018-03-18 MED ORDER — PROPOFOL 10 MG/ML IV BOLUS
INTRAVENOUS | Status: AC
Start: 2018-03-18 — End: 2018-03-18
  Filled 2018-03-18: qty 40

## 2018-03-18 MED ORDER — PHENYLEPHRINE HCL 10 MG/ML IJ SOLN
INTRAMUSCULAR | Status: DC | PRN
  Administered 2018-03-18 (×2): 40 ug via INTRAVENOUS

## 2018-03-18 MED ORDER — PROPOFOL 10 MG/ML IV BOLUS
INTRAVENOUS | Status: DC | PRN
  Administered 2018-03-18 (×2): 50 mg via INTRAVENOUS

## 2018-03-18 MED ORDER — HYDROCODONE-ACETAMINOPHEN 10-325 MG PO TABS: 1.0000 | ORAL_TABLET | ORAL | 0 refills | Status: DC | PRN

## 2018-03-18 MED ORDER — DEXAMETHASONE SODIUM PHOSPHATE 10 MG/ML IJ SOLN
INTRAMUSCULAR | Status: AC
  Filled 2018-03-18: qty 1

## 2018-03-18 MED ORDER — KETOROLAC TROMETHAMINE 15 MG/ML IJ SOLN
15.0000 mg | Freq: Once | INTRAMUSCULAR | Status: DC
  Filled 2018-03-18: qty 1

## 2018-03-18 MED ORDER — HYDROCODONE-ACETAMINOPHEN 5-325 MG PO TABS
ORAL_TABLET | ORAL | Status: AC
  Filled 2018-03-18: qty 1

## 2018-03-18 MED ORDER — PHENAZOPYRIDINE HCL 200 MG PO TABS: 200.0000 mg | ORAL_TABLET | Freq: Three times a day (TID) | ORAL | 0 refills | Status: DC | PRN

## 2018-03-18 MED ORDER — HYDROCODONE-ACETAMINOPHEN 5-325 MG PO TABS
1.0000 | ORAL_TABLET | Freq: Once | ORAL | Status: AC
  Administered 2018-03-18: 1 via ORAL
  Filled 2018-03-18: qty 1

## 2018-03-18 MED ORDER — ONDANSETRON HCL 4 MG/2ML IJ SOLN
INTRAMUSCULAR | Status: DC | PRN
  Administered 2018-03-18: 4 mg via INTRAVENOUS

## 2018-03-18 MED ORDER — DEXAMETHASONE SODIUM PHOSPHATE 10 MG/ML IJ SOLN
INTRAMUSCULAR | Status: DC | PRN
  Administered 2018-03-18: 10 mg via INTRAVENOUS

## 2018-03-18 MED ORDER — OXYCODONE HCL 5 MG PO TABS
5.0000 mg | ORAL_TABLET | Freq: Once | ORAL | Status: DC | PRN
  Filled 2018-03-18: qty 1

## 2018-03-18 MED ORDER — LIDOCAINE 2% (20 MG/ML) 5 ML SYRINGE
INTRAMUSCULAR | Status: AC
  Filled 2018-03-18: qty 5

## 2018-03-18 MED ORDER — PHENYLEPHRINE HCL 10 MG/ML IJ SOLN
INTRAMUSCULAR | Status: AC
  Filled 2018-03-18: qty 1

## 2018-03-18 MED ORDER — CIPROFLOXACIN IN D5W 400 MG/200ML IV SOLN
400.0000 mg | Freq: Once | INTRAVENOUS | Status: AC
  Administered 2018-03-18: 400 mg via INTRAVENOUS
  Filled 2018-03-18: qty 200

## 2018-03-18 MED ORDER — SODIUM CHLORIDE 0.9 % IV SOLN
INTRAVENOUS | Status: DC
  Administered 2018-03-18 (×2): via INTRAVENOUS
  Filled 2018-03-18: qty 1000

## 2018-03-18 MED ORDER — FENTANYL CITRATE (PF) 100 MCG/2ML IJ SOLN
INTRAMUSCULAR | Status: DC | PRN
  Administered 2018-03-18 (×4): 25 ug via INTRAVENOUS

## 2018-03-18 MED ORDER — MEPERIDINE HCL 25 MG/ML IJ SOLN
6.2500 mg | INTRAMUSCULAR | Status: DC | PRN
  Filled 2018-03-18: qty 1

## 2018-03-18 MED ORDER — CIPROFLOXACIN IN D5W 400 MG/200ML IV SOLN
INTRAVENOUS | Status: AC
  Filled 2018-03-18: qty 200

## 2018-03-18 MED ORDER — IOHEXOL 300 MG/ML  SOLN
INTRAMUSCULAR | Status: DC | PRN
  Administered 2018-03-18: 16 mL via URETHRAL

## 2018-03-18 MED ORDER — PHENAZOPYRIDINE HCL 200 MG PO TABS
200.0000 mg | ORAL_TABLET | Freq: Once | ORAL | Status: AC
  Administered 2018-03-18: 200 mg via ORAL
  Filled 2018-03-18: qty 1

## 2018-03-18 MED ORDER — ONDANSETRON HCL 4 MG/2ML IJ SOLN
INTRAMUSCULAR | Status: AC
  Filled 2018-03-18: qty 2

## 2018-03-18 MED ORDER — ACETAMINOPHEN 325 MG PO TABS
325.0000 mg | ORAL_TABLET | ORAL | Status: DC | PRN
  Filled 2018-03-18: qty 2

## 2018-03-18 MED ORDER — LIDOCAINE HCL (CARDIAC) PF 100 MG/5ML IV SOSY: PREFILLED_SYRINGE | INTRAVENOUS | Status: DC | PRN

## 2018-03-18 MED ORDER — ACETAMINOPHEN 160 MG/5ML PO SOLN
325.0000 mg | ORAL | Status: DC | PRN
  Filled 2018-03-18: qty 20.3

## 2018-03-18 MED ORDER — SODIUM CHLORIDE 0.9 % IR SOLN
Status: DC | PRN
  Administered 2018-03-18: 3000 mL via INTRAVESICAL

## 2018-03-18 MED ORDER — PROMETHAZINE HCL 25 MG/ML IJ SOLN
6.2500 mg | INTRAMUSCULAR | Status: DC | PRN
  Filled 2018-03-18: qty 1

## 2018-03-18 SURGICAL SUPPLY — 15 items
BAG DRAIN URO-CYSTO SKYTR STRL (DRAIN) ×2 IMPLANT
CATH INTERMIT  6FR 70CM (CATHETERS) ×2 IMPLANT
CLOTH BEACON ORANGE TIMEOUT ST (SAFETY) ×2 IMPLANT
GLOVE BIO SURGEON STRL SZ8 (GLOVE) ×2 IMPLANT
GOWN STRL REUS W/TWL XL LVL3 (GOWN DISPOSABLE) ×4 IMPLANT
GUIDEWIRE STR DUAL SENSOR (WIRE) ×2 IMPLANT
IV NS IRRIG 3000ML ARTHROMATIC (IV SOLUTION) ×2 IMPLANT
KIT TURNOVER CYSTO (KITS) ×2 IMPLANT
MANIFOLD NEPTUNE II (INSTRUMENTS) ×2 IMPLANT
NS IRRIG 500ML POUR BTL (IV SOLUTION) ×2 IMPLANT
PACK CYSTO (CUSTOM PROCEDURE TRAY) ×2 IMPLANT
SHEATH URET ACCESS 12FR/35CM (UROLOGICAL SUPPLIES) ×2 IMPLANT
SYR 10ML LL (SYRINGE) ×2 IMPLANT
TUBE CONNECTING 12X1/4 (SUCTIONS) ×2 IMPLANT
TUBING UROLOGY SET (TUBING) ×2 IMPLANT

## 2018-03-18 NOTE — Anesthesia Procedure Notes (Signed)
Procedure Name: LMA Insertion Date/Time: 03/18/2018 7:28 AM Performed by: Justice Rocher, CRNA Pre-anesthesia Checklist: Patient identified, Emergency Drugs available, Suction available and Patient being monitored Patient Re-evaluated:Patient Re-evaluated prior to induction Oxygen Delivery Method: Circle system utilized Preoxygenation: Pre-oxygenation with 100% oxygen Induction Type: IV induction Ventilation: Mask ventilation without difficulty LMA: LMA inserted LMA Size: 5.0 Number of attempts: 1 Airway Equipment and Method: Bite block Placement Confirmation: positive ETCO2 and breath sounds checked- equal and bilateral Tube secured with: Tape Dental Injury: Teeth and Oropharynx as per pre-operative assessment

## 2018-03-18 NOTE — Anesthesia Postprocedure Evaluation (Signed)
Anesthesia Post Note  Patient: Adam Harris  Procedure(s) Performed: CYSTOSCOPY/RETROGRADE/URETEROSCOPY. (Bilateral Ureter)     Patient location during evaluation: PACU Anesthesia Type: General Level of consciousness: awake and sedated Pain management: pain level controlled Vital Signs Assessment: post-procedure vital signs reviewed and stable Respiratory status: spontaneous breathing Cardiovascular status: stable Postop Assessment: no apparent nausea or vomiting Anesthetic complications: no    Last Vitals:  Vitals:   03/18/18 0915 03/18/18 0930  BP: 132/80 (!) 129/91  Pulse: (!) 53 61  Resp: 12 17  Temp:    SpO2: 99% 95%    Last Pain:  Vitals:   03/18/18 0930  TempSrc:   PainSc: 2    Pain Goal: Patients Stated Pain Goal: 4 (03/18/18 0631)               [place]

## 2018-03-18 NOTE — Op Note (Signed)
PATIENT:  Adam Harris  PRE-OPERATIVE DIAGNOSIS: Abnormal findings on CT  POST-OPERATIVE DIAGNOSIS: 1.  Abnormal findings on CT.    2.  Mild fossa navicularis stricture.  PROCEDURE: 1.  Cystoscopy with bilateral retrograde pyelograms including interpretation. 2.  Bilateral ureteroscopy 3.  Dilation of distal urethral stenosis.  SURGEON:  Claybon Jabs  INDICATION: Adam Harris is a 17 male who was evaluated for microscopic hematuria with a CT scan which revealed some enhancement of the right ureteral wall as well as what appeared to be a possible filling defect at the level of the UPJ on the left-hand side.  Cystoscopy revealed no abnormality of the bladder.  He is brought to the operating room for further evaluation of both his right and left ureters.  ANESTHESIA:  General  EBL:  Minimal  DRAINS: None  LOCAL MEDICATIONS USED:  None  SPECIMEN: Barbotage specimen from right ureter to pathology  Description of procedure: After informed consent the patient was taken to the operating room and placed on the table in a supine position. General anesthesia was then administered. Once fully anesthetized the patient was moved to the dorsal lithotomy position and the genitalia were sterilely prepped and draped in standard fashion. An official timeout was then performed.  I initially attempted to pass a 60 French cystoscope but was unable to advance the scope through  the area of the fossa navicularis distally.  I therefore used [employer] sounds to dilate his distal urethra starting at 54 [place] and progressing to 24 [place].  I noted a moderate amount of difficulty passing the 24 [place] but noted no bleeding or tearing of the urethra after the dilation.  The 23 French cystoscope with 30 degree lens was then passed under direct vision down the urethra which was noted to be entirely normal.  The prostatic urethra bladder was entered and fully and systematically inspected.  There were no tumors, stones or  inflammatory lesions.  The right and left ureteral orifice were noted to be of normal configuration and position.  6 [place] open-ended ureteral catheter was then passed through the cystoscope and into the right ureteral orifice.  Full-strength Omnipaque contrast was then injected through the open-ended catheter and up the right ureter under real-time fluoroscopy.  This revealed an entirely normal ureter throughout its length with a normal-appearing intrarenal collecting system.  I therefore passed the 0.038 inch floppy tip sensor guidewire through the open-ended catheter and up the right ureter into the area the renal pelvis under fluoroscopic control and left this in place removing the open-ended catheter and the cystoscope.  The 6 French flexible, digital ureteroscope was then passed over the guidewire after I dilated the intramural ureter with the inner portion of the ureteral access sheath.  I advanced the scope up the ureter under direct vision and noted no abnormality of the ureter throughout its entire length.  I then backed the scope down the ureter and obtained a barbotage specimen from the mid ureter and a second barbotage specimen from the lower ureter and these were combined and sent for cytology.  I then backed the scope on down the ureter under direct vision and again noted entirely normal ureteral mucosa throughout its length.  I then proceeded to perform a left retrograde pyelogram in an identical fashion.  This revealed some slight narrowing of the ureter just above the ureterovesical junction appeared to have no filling defects or mass-effect.  The remainder of the ureter was entirely normal and not dilated.  I noted no evidence of filling defect at the area of the ureteropelvic junction and a normal intrarenal collecting system.  I passed the guidewire in identical fashion as described above and dilated the intramural ureter with the inner portion of the ureteral access sheath and attempted  to pass the flexible, digital ureteroscope over the guidewire but met some resistance at the level of the intramural ureter.  I therefore removed the ureteroscope, inserted the ureteral access sheath over guidewire up the right ureter and then remove the guidewire.  Flexeril ureteroscope was then passed through the access sheath and into the area of the renal pelvis.  I then did a systematic inspection of which noted and then circumferentially evaluated the renal pelvis and ureteropelvic junction region with no filling defect noted.  I then backed the scope down the ureter under direct vision noting the entire ureter to be normal in appearance.  The very distal ureter appeared to be normal as well with no abnormality of the mucosa.  I then removed the access sheath and ureteroscope, drained the bladder and the patient was awakened and taken to recovery room in stable and satisfactory condition.  He tolerated procedure well no intraoperative complications.  PLAN OF CARE: Discharge to home after PACU  PATIENT DISPOSITION:  PACU - hemodynamically stable.

## 2018-03-18 NOTE — Discharge Instructions (Signed)
Cystoscopy patient instructions  Following a cystoscopy, a catheter (a flexible rubber tube) is sometimes left in place to empty the bladder. This may cause some discomfort or a feeling that you need to urinate. Your doctor determines the period of time that the catheter will be left in place. You may have bloody urine for two to three days (Call your doctor if the amount of bleeding increases or does not subside).  You may pass blood clots in your urine, especially if you had a biopsy. It is not unusual to pass small blood clots and have some bloody urine a couple of weeks after your cystoscopy. Again, call your doctor if the bleeding does not subside. You may have: Dysuria (painful urination) Frequency (urinating often) Urgency (strong desire to urinate)  These symptoms are common especially if medicine is instilled into the bladder or a ureteral stent is placed. Avoiding alcohol and caffeine, such as coffee, tea, and chocolate, may help relieve these symptoms. Drink plenty of water, unless otherwise instructed. Your doctor may also prescribe an antibiotic or other medicine to reduce these symptoms.  Cystoscopy results are available soon after the procedure; biopsy results usually take two to four days. Your doctor will discuss the results of your exam with you. Before you go home, you will be given specific instructions for follow-up care.  Note: Printed twice an option to hold but I checked a major Special Instructions:  1 If you are going home with a catheter in place do not take a tub bath until removed by your doctor.  2 You may resume your normal activities.  3 Do not drive or operate machinery if you are taking narcotic pain medicine.  4 Be sure to keep all follow-up appointments with your doctor.   5 Call Your Doctor If: The catheter is not draining  You have severe pain  You are unable to urinate  You have a fever over 101  You have severe bleeding  Post Anesthesia Home  Care Instructions  Activity: Get plenty of rest for the remainder of the day. A responsible individual must stay with you for 24 hours following the procedure.  For the next 24 hours, DO NOT: -Drive a car -[name] -Drink alcoholic beverages -Take any medication unless instructed by your physician -Make any legal decisions or sign important papers.  Meals: Start with liquid foods such as gelatin or soup. Progress to regular foods as tolerated. Avoid greasy, spicy, heavy foods. If nausea and/or vomiting occur, drink only clear liquids until the nausea and/or vomiting subsides. Call your physician if vomiting continues.  Special Instructions/Symptoms: Your throat may feel dry or sore from the anesthesia or the breathing tube placed in your throat during surgery. If this causes discomfort, gargle with warm salt water. The discomfort should disappear within 24 hours.

## 2018-03-18 NOTE — Transfer of Care (Signed)
Immediate Anesthesia Transfer of Care Note  Patient: Adam Harris  Procedure(s) Performed: Procedure(s) (LRB): CYSTOSCOPY/RETROGRADE/URETEROSCOPY. (Bilateral)  Patient Location: PACU  Anesthesia Type: General  Level of Consciousness: awake, sedated, patient cooperative and responds to stimulation  Airway & Oxygen Therapy: Patient Spontanous Breathing and Patient connected to NCO2  Post-op Assessment: Report given to PACU RN, Post -op Vital signs reviewed and stable and Patient moving all extremities  Post vital signs: Reviewed and stable  Complications: No apparent anesthesia complications

## 2018-03-19 ENCOUNTER — Encounter (HOSPITAL_BASED_OUTPATIENT_CLINIC_OR_DEPARTMENT_OTHER): Payer: Self-pay | Admitting: Urology

## 2018-03-26 DIAGNOSIS — R9341 Abnormal radiologic findings on diagnostic imaging of renal pelvis, ureter, or bladder: Secondary | ICD-10-CM | POA: Diagnosis not present

## 2018-05-09 ENCOUNTER — Other Ambulatory Visit: Payer: Self-pay | Admitting: Internal Medicine

## 2018-07-07 ENCOUNTER — Other Ambulatory Visit: Payer: Self-pay

## 2018-07-07 ENCOUNTER — Emergency Department (HOSPITAL_BASED_OUTPATIENT_CLINIC_OR_DEPARTMENT_OTHER)
Admission: EM | Admit: 2018-07-07 | Discharge: 2018-07-07 | Disposition: A | Discharge status: Home / Self Care | Payer: Medicare Other | Attending: Emergency Medicine | Admitting: Emergency Medicine

## 2018-07-07 ENCOUNTER — Encounter (HOSPITAL_BASED_OUTPATIENT_CLINIC_OR_DEPARTMENT_OTHER): Payer: Self-pay | Admitting: *Deleted

## 2018-07-07 DIAGNOSIS — E1122 Type 2 diabetes mellitus with diabetic chronic kidney disease: Secondary | ICD-10-CM | POA: Insufficient documentation

## 2018-07-07 DIAGNOSIS — I2510 Atherosclerotic heart disease of native coronary artery without angina pectoris: Secondary | ICD-10-CM | POA: Diagnosis not present

## 2018-07-07 DIAGNOSIS — W108XXA Fall (on) (from) other stairs and steps, initial encounter: Secondary | ICD-10-CM | POA: Insufficient documentation

## 2018-07-07 DIAGNOSIS — S60142A Contusion of left ring finger with damage to nail, initial encounter: Secondary | ICD-10-CM | POA: Diagnosis not present

## 2018-07-07 DIAGNOSIS — N183 Chronic kidney disease, stage 3 (moderate): Secondary | ICD-10-CM | POA: Insufficient documentation

## 2018-07-07 DIAGNOSIS — S0181XA Laceration without foreign body of other part of head, initial encounter: Secondary | ICD-10-CM

## 2018-07-07 DIAGNOSIS — W01190A Fall on same level from slipping, tripping and stumbling with subsequent striking against furniture, initial encounter: Secondary | ICD-10-CM | POA: Diagnosis not present

## 2018-07-07 DIAGNOSIS — I5032 Chronic diastolic (congestive) heart failure: Secondary | ICD-10-CM | POA: Diagnosis not present

## 2018-07-07 DIAGNOSIS — Y999 Unspecified external cause status: Secondary | ICD-10-CM | POA: Diagnosis not present

## 2018-07-07 DIAGNOSIS — I130 Hypertensive heart and chronic kidney disease with heart failure and stage 1 through stage 4 chronic kidney disease, or unspecified chronic kidney disease: Secondary | ICD-10-CM | POA: Diagnosis not present

## 2018-07-07 DIAGNOSIS — Z7901 Long term (current) use of anticoagulants: Secondary | ICD-10-CM | POA: Diagnosis not present

## 2018-07-07 DIAGNOSIS — S6010XA Contusion of unspecified finger with damage to nail, initial encounter: Secondary | ICD-10-CM

## 2018-07-07 DIAGNOSIS — S01511A Laceration without foreign body of lip, initial encounter: Secondary | ICD-10-CM | POA: Diagnosis not present

## 2018-07-07 DIAGNOSIS — Z7984 Long term (current) use of oral hypoglycemic drugs: Secondary | ICD-10-CM | POA: Insufficient documentation

## 2018-07-07 DIAGNOSIS — Y939 Activity, unspecified: Secondary | ICD-10-CM | POA: Diagnosis not present

## 2018-07-07 DIAGNOSIS — W1800XA Striking against unspecified object with subsequent fall, initial encounter: Secondary | ICD-10-CM

## 2018-07-07 DIAGNOSIS — Y929 Unspecified place or not applicable: Secondary | ICD-10-CM | POA: Diagnosis not present

## 2018-07-07 DIAGNOSIS — Z23 Encounter for immunization: Secondary | ICD-10-CM | POA: Diagnosis not present

## 2018-07-07 MED ORDER — HYDROGEN PEROXIDE 1.5 % MT GEL: 1.0000 "application " | Freq: Two times a day (BID) | OROMUCOSAL | 0 refills | Status: AC

## 2018-07-07 MED ORDER — TETANUS-DIPHTH-ACELL PERTUSSIS 5-2.5-18.5 LF-MCG/0.5 IM SUSP
0.5000 mL | Freq: Once | INTRAMUSCULAR | Status: AC
Start: 2018-07-07 — End: 2018-07-07
  Administered 2018-07-07: 0.5 mL via INTRAMUSCULAR
  Filled 2018-07-07: qty 0.5

## 2018-07-07 MED ORDER — LIDOCAINE HCL (PF) 1 % IJ SOLN
5.0000 mL | Freq: Once | INTRAMUSCULAR | Status: AC
  Administered 2018-07-07: 5 mL via INTRADERMAL
  Filled 2018-07-07: qty 5

## 2018-07-07 NOTE — ED Triage Notes (Signed)
Pt states he was walking downstairs and tripped and fell. Pt has laceration to chin

## 2018-07-07 NOTE — ED Notes (Signed)
ED Provider at bedside for lac repair 

## 2018-07-07 NOTE — ED Provider Notes (Signed)
[facility] EMERGENCY DEPARTMENT Provider Note   CSN: [id number] Arrival date & time: 07/07/18  1724     History   Chief Complaint Chief Complaint  Patient presents with  . Fall  . Facial Laceration    HPI Vuk Skillern is a 72 y.o. male with a past medical history of long-term anticoagulation, CAD, history of MI, CKD, diabetes who presents emergency department for evaluation of laceration after fall.  Patient states that he was walking down the stairs when his son called him.  He turned quickly lost his footing and fell backwards landing his face against the edge of a Bureau.  He also injured his left ring finger.  He denies loss of consciousness, neck pain or head injury.  He is on blood thinner.  He complains of laceration to the outer lower lip.Marland Kitchen  He has noted on the bottom though denies any damage.  Will give 1 dose on the injury occurred just prior to arrival  HPI  Past Medical History:  Diagnosis Date  . Abnormal radiologic findings on diagnostic imaging of renal pelvis, ureter, or bladder    bilateral ureter abnormalities  . Anticoagulant long-term use    eliquis  . Arthritis   . CAD (coronary artery disease) cardiologist-- dr hochrein   NSTEMI 02-04-2014  per cardiac cath chronic occluded RCA w/ faint left-to-right collaterals and aneurysmal LCFx with sluggish coronary flow/   NSTEMI --11-21-2016 per cardiac cath occluded proximal RCA & mid to diastal CFX 100%, med rx. If that does not work, PTCA or CABG  . CKD (chronic kidney disease), stage III [facility])    patient unaware  . DOE (dyspnea on exertion)   . Hematuria   . History of non-ST elevation myocardial infarction (NSTEMI)    02-04-2014  and 11-21-2016  cardiac cath done both times ,  medically management  . History of shingles 12/2017   slight pain and numbness still noted in the area  . Hypertension   . Myocardial infarction ([place]) 2015  . Persistent atrial fibrillation [facility])    cardiologsit-- dr Percival Spanish    . Type 2 diabetes mellitus [facility])     Patient Active Problem List   Diagnosis Date Noted  . Shingles 01/10/2018  . Chronic diastolic CHF (congestive heart failure), NYHA class 2 ([place]) 04/02/2017  . Atrial fibrillation with RVR ([place]) 11/20/2016  . Degenerative arthritis of knee, bilateral 05/31/2016  . Low back pain 05/31/2016  . Routine general medical examination at a health care facility 12/25/2014  . Exertional angina ([place]) 10/29/2014  . Atrial fibrillation, new onset ([place]) 10/29/2014  . Chronic renal disease, stage III ([place]) 10/29/2014  . Coronary artery disease 03/09/2014  . Diabetes mellitus, type 2 ([place]) 02/17/2014  . Dyslipidemia 02/05/2014  . NSTEMI (non-ST elevated myocardial infarction) ([place]) 02/04/2014  . History of non-ST elevation myocardial infarction (NSTEMI) 02/03/2014  . Essential hypertension 02/03/2014  . Abnormal prostate specific antigen 04/19/2013    Past Surgical History:  Procedure Laterality Date  . CARDIAC CATHETERIZATION N/A 11/21/2016   Procedure: Left Heart Cath and Coronary Angiography;  Surgeon: Troy Sine, MD;  Location: [facility] CV LAB;  Service: Cardiovascular;  Laterality: N/A;  pRCA 100% , ostial LAD 45%, OM3 80%, mCFX to dCFX 100% (AV groove), lateral OM3 50%  . COLONOSCOPY    . CYSTOSCOPY/RETROGRADE/URETEROSCOPY Bilateral 03/18/2018   Procedure: CYSTOSCOPY/RETROGRADE/URETEROSCOPY.;  Surgeon: [name], MD;  [facility]: [facility];  Service: Urology;  Laterality: Bilateral;  . KNEE ARTHROSCOPY    .  LEFT HEART CATHETERIZATION WITH CORONARY ANGIOGRAM N/A 02/04/2014   Procedure: LEFT HEART CATHETERIZATION WITH CORONARY ANGIOGRAM;  Surgeon: [name], MD;  Location: [facility] CATH LAB;  Service: Cardiovascular;  Laterality: N/A;  severe one-vessel CAD, chronically occluded RCA with faint left-to-right collaterals;  aneurysmal LCFx with sluggish coronary flow;  normal LVSF w/ moderately elevated LVEDP (ostialOM2 20%, pOM3 20%, pD1  20%, mCFX 50%, diffuse 20% pCFX)  . TRANSTHORACIC ECHOCARDIOGRAM  11-22-2016   dr hochrein   ef 55-60%/ mild MR and TR/ moderate LAE        Home Medications    Prior to Admission medications   Medication Sig Start Date End Date Taking? Authorizing Provider  ELIQUIS 5 MG TABS tablet Take 1 tablet (5 mg total) by mouth 2 (two) times daily. 02/04/18  Yes Minus Breeding, MD  atorvastatin (LIPITOR) 80 MG tablet Take 1 tablet (80 mg total) by mouth every evening. 12/04/17   Minus Breeding, MD  diphenhydramine-acetaminophen (TYLENOL PM) 25-500 MG TABS tablet Take 1 tablet by mouth at bedtime as needed.    [provider]  furosemide (LASIX) 20 MG tablet TAKE 1 TABLET (20 MG TOTAL) BY MOUTH DAILY. 02/04/18 05/05/18  Minus Breeding, MD  HYDROcodone-acetaminophen (NORCO) 10-325 MG tablet Take 1-2 tablets by mouth every 4 (four) hours as needed for moderate pain. Maximum dose per 24 hours - 8 pills 03/18/18   [name], MD  isosorbide mononitrate (IMDUR) 30 MG 24 hr tablet Take 1 tablet (30 mg total) by mouth daily. 12/27/17   Barrett, Evelene Croon, PA-C  metFORMIN (GLUCOPHAGE) 500 MG tablet TAKE 1 TABLET (500 MG TOTAL) BY MOUTH TWO (TWO) TIMES DAILY WITH A MEAL. NEEDS ANNUAL APPOINTMENT FOR FURTHER REFILLS 05/09/18   Hoyt Koch, MD  metoprolol tartrate (LOPRESSOR) 50 MG tablet Take 1 tablet (50 mg total) by mouth 2 (two) times daily. 02/12/18   Minus Breeding, MD  nitroGLYCERIN (NITROSTAT) 0.4 MG SL tablet Place 1 tablet under the tongue every 5 minutes times 3 doses as needed for chest pains, call 911 if 2nd dose does not help 11/22/16   Barrett, Evelene Croon, PA-C  Olmesartan-amLODIPine-HCTZ (TRIBENZOR) 40-10-12.5 MG TABS Take 0.5 tablets by mouth daily. 03/06/18   Almyra Deforest, PA  phenazopyridine (PYRIDIUM) 200 MG tablet Take 1 tablet (200 mg total) by mouth 3 (three) times daily as needed for pain. 03/18/18   [name], MD  traMADol (ULTRAM) 50 MG tablet Take 1 tablet (50 mg total) by mouth  every 8 (eight) hours as needed. 01/10/18   Hoyt Koch, MD    Family History Family History  Problem Relation Age of Onset  . Hypertension Mother     Social History Social History   Tobacco Use  . Smoking status: Never Smoker  . Smokeless tobacco: Never Used  Substance Use Topics  . Alcohol use: No  . Drug use: No     Allergies   Xarelto [rivaroxaban]   Review of Systems Review of Systems Ten systems reviewed and are negative for acute change, except as noted in the HPI.    Physical Exam Updated Vital Signs BP (!) 157/88 (BP Location: Left Arm)   Pulse 64   Temp 97.7 F (36.5 C) (Oral)   Resp 18   Ht 6' (1.829 m)   Wt 97.5 kg   SpO2 100%   BMI 29.16 kg/m   Physical Exam  Physical Exam  Nursing note and vitals reviewed. Constitutional: He appears well-developed and well-nourished. No distress.  HENT:  Head: Normocephalic and 2 cm jagged laceration in the left mental region.  Laceration does extend through to the oral mucosa at the base of the lip. the lip is split but that does not involve the vermilion border.  There is a small hematoma on the posterior lip..  Eyes: Conjunctivae normal are normal. No scleral icterus.  Neck: Normal range of motion. Neck supple.  Cardiovascular: Normal rate, regular rhythm and normal heart sounds.   Pulmonary/Chest: Effort normal and breath sounds normal. No respiratory distress.  Abdominal: Soft. There is no tenderness.  Musculoskeletal: He exhibits no edema.  Neurological: Speech is clear and goal oriented, follows commands Major Cranial nerves without deficit, no facial droop Normal strength in upper and lower extremities bilaterally including dorsiflexion and plantar flexion, strong and equal grip strength Sensation normal to light and sharp touch Moves extremities without ataxia, coordination intact Normal finger to nose and rapid alternating movements Neg romberg, no pronator drift Normal gait Normal  heel-shin and balance Musculoskeletal: Left ring finger with small subungual hematoma just above the lunula.  Bruising on the tip of the finger, minimal tenderness.  Full range of motion and strength.  Neurovascularly intact Skin: Skin is warm and dry. He is not diaphoretic.  Psychiatric: His behavior is normal.    ED Treatments / Results  Labs (all labs ordered are listed, but only abnormal results are displayed) Labs Reviewed - No data to display  EKG None  Radiology No results found.  Procedures Procedures (including critical care time) LACERATION REPAIR Performed by: Margarita Mail Authorized by: Margarita Mail Consent: Verbal consent obtained. Risks and benefits: risks, benefits and alternatives were discussed Consent given by: patient Patient identity confirmed: provided demographic data Prepped and Draped in normal sterile fashion Wound explored  Laceration Location: Left lower face  Laceration Length: 3 cm  No Foreign Bodies seen or palpated  Anesthesia: local infiltration  Local anesthetic: lidocaine 1% w/o epinephrine  Anesthetic total: 3 ml  Irrigation method: syringe Amount of cleaning: standard  Skin closure: Five-point 0 Prolene  Number of sutures: 3  Technique: Simple interrupted  Patient tolerance: Patient tolerated the procedure well with no immediate complications.  Medications Ordered in ED Medications  lidocaine (PF) (XYLOCAINE) 1 % injection 5 mL (has no administration in time range)  Tdap (BOOSTRIX) injection 0.5 mL (has no administration in time range)     Initial Impression / Assessment and Plan / ED Course  I have reviewed the triage vital signs and the nursing notes.  Pertinent labs & imaging results that were available during my care of the patient were reviewed by me and considered in my medical decision making (see chart for details).     Antwane Grose is a 72 y.o. male who presents to ED for laceration of face. Wound  thoroughly cleaned in ED today. Wound explored and bottom of wound seen in a bloodless field. Laceration repaired as dictated above. Patient counseled on home wound care. Follow up with PCP/urgent care or return to ER for suture removal in  5 days. Patient was urged to return to the Emergency Department for worsening pain, swelling, expanding erythema especially if it streaks away from the affected area, fever, or for any additional concerns. Patient verbalized understanding. All questions answered.   Final Clinical Impressions(s) / ED Diagnoses   Final diagnoses:  Fall against object  Facial laceration, initial encounter  Lip laceration, initial encounter  Subungual hematoma of digit of hand, initial encounter    ED Discharge Orders  None       Margarita Mail, PA-C 07/07/18 1943    Charlesetta Shanks, MD 07/07/18 [phone]

## 2018-07-07 NOTE — Discharge Instructions (Signed)
WOUND CARE Please have your stitches/staples removed in 5 to 7 days or sooner if you have concerns. You may do this at any available urgent care or at your primary care doctor's office.  Keep area clean and dry for 24 hours. Do not remove bandage, if applied.  After 24 hours, remove bandage and wash wound gently with mild soap and warm water. Reapply a new bandage after cleaning wound, if directed.  Continue daily cleansing with soap and water until stitches/staples are removed.  Do not apply any ointments or creams to the wound while stitches/staples are in place, as this may cause delayed healing.  Seek medical careif you experience any of the following signs of infection: Swelling, redness, pus drainage, streaking, fever >101.0 F  Seek care if you experience excessive bleeding that does not stop after 15-20 minutes of constant, firm pressure.

## 2018-07-19 ENCOUNTER — Encounter: Payer: Self-pay | Admitting: Gastroenterology

## 2018-07-22 ENCOUNTER — Other Ambulatory Visit: Payer: Self-pay

## 2018-07-22 MED ORDER — ISOSORBIDE MONONITRATE ER 30 MG PO TB24: 30.0000 mg | ORAL_TABLET | Freq: Every day | ORAL | 1 refills | Status: DC

## 2018-07-22 NOTE — Telephone Encounter (Signed)
Rx(s) sent to pharmacy electronically.  

## 2018-07-24 ENCOUNTER — Other Ambulatory Visit: Payer: Self-pay | Admitting: Internal Medicine

## 2018-07-31 ENCOUNTER — Other Ambulatory Visit: Payer: Self-pay | Admitting: Internal Medicine

## 2018-08-23 ENCOUNTER — Encounter: Payer: Self-pay | Admitting: Family Medicine

## 2018-08-23 ENCOUNTER — Other Ambulatory Visit: Payer: Self-pay | Admitting: Cardiology

## 2018-08-23 ENCOUNTER — Other Ambulatory Visit: Payer: Self-pay

## 2018-08-23 ENCOUNTER — Ambulatory Visit (INDEPENDENT_AMBULATORY_CARE_PROVIDER_SITE_OTHER): Payer: Medicare Other | Admitting: Family Medicine

## 2018-08-23 VITALS — BP 118/88 | HR 67 | Temp 98.7°F | Ht 72.0 in | Wt 207.2 lb

## 2018-08-23 DIAGNOSIS — C61 Malignant neoplasm of prostate: Secondary | ICD-10-CM

## 2018-08-23 DIAGNOSIS — E1122 Type 2 diabetes mellitus with diabetic chronic kidney disease: Secondary | ICD-10-CM | POA: Diagnosis not present

## 2018-08-23 DIAGNOSIS — Z1283 Encounter for screening for malignant neoplasm of skin: Secondary | ICD-10-CM

## 2018-08-23 DIAGNOSIS — Z23 Encounter for immunization: Secondary | ICD-10-CM

## 2018-08-23 DIAGNOSIS — N183 Chronic kidney disease, stage 3 unspecified: Secondary | ICD-10-CM

## 2018-08-23 DIAGNOSIS — E785 Hyperlipidemia, unspecified: Secondary | ICD-10-CM | POA: Diagnosis not present

## 2018-08-23 DIAGNOSIS — I5032 Chronic diastolic (congestive) heart failure: Secondary | ICD-10-CM

## 2018-08-23 DIAGNOSIS — I10 Essential (primary) hypertension: Secondary | ICD-10-CM

## 2018-08-23 DIAGNOSIS — Z0000 Encounter for general adult medical examination without abnormal findings: Secondary | ICD-10-CM

## 2018-08-23 NOTE — Progress Notes (Signed)
Adam Harris is a 72 y.o. male here as a new patient to our office to establish care and for his annual exam and f/u on chronic medical issues including HTN, DM, hyperlipidemia, CKD, h/o prostate cancer. He has a significant cardiac hx as well with CHF, CAD, NSTEMI x 2 most recently in 11/2016. He is accompanied by his wife.  He is due for colonoscopy and has appt with GI on 08/29/18.  He appears to be due for labs including A1C, LFTs. BMP, PSA were done in 03/2018 and 12/2017 respectively.  He is due for flu vaccine but is UTD on pneumonia vaccines, Tdap. He was diagnosed with shingles in 12/2017 but has not had shingrix vaccine. Specialists - cardio (Dr. Minus Breeding), urology (Dr. [name]) Recent hospitalization - ER visit in 06/2018 for a fall and laceration repair  He is concerned about an area on his face just beneath Rt eye that has been present for years and has gotten slightly smaller but feels rough and "like a scab" at times. Pt states he picks and peels it. He does have a h/o significant sun exposure.   Past Medical History:  Diagnosis Date  . Abnormal radiologic findings on diagnostic imaging of renal pelvis, ureter, or bladder    bilateral ureter abnormalities  . Anticoagulant long-term use    eliquis  . Arthritis   . CAD (coronary artery disease) cardiologist-- dr hochrein   NSTEMI 02-04-2014  per cardiac cath chronic occluded RCA w/ faint left-to-right collaterals and aneurysmal LCFx with sluggish coronary flow/   NSTEMI --11-21-2016 per cardiac cath occluded proximal RCA & mid to diastal CFX 100%, med rx. If that does not work, PTCA or CABG  . CKD (chronic kidney disease), stage III [facility])    patient unaware  . DOE (dyspnea on exertion)   . Hematuria   . History of non-ST elevation myocardial infarction (NSTEMI)    02-04-2014  and 11-21-2016  cardiac cath done both times ,  medically management  . History of shingles 12/2017   slight pain and numbness still noted in the area   . Hypertension   . Myocardial infarction ([place]) 2015  . Persistent atrial fibrillation    cardiologsit-- dr Percival Spanish  . Type 2 diabetes mellitus ([place])    Past Surgical History:  Procedure Laterality Date  . CARDIAC CATHETERIZATION N/A 11/21/2016   Procedure: Left Heart Cath and Coronary Angiography;  Surgeon: Troy Sine, MD;  Location: [place] [facility];  Service: Cardiovascular;  Laterality: N/A;  pRCA 100% , ostial LAD 45%, OM3 80%, mCFX to dCFX 100% (AV groove), lateral OM3 50%  . COLONOSCOPY    . CYSTOSCOPY/RETROGRADE/URETEROSCOPY Bilateral 03/18/2018   Procedure: CYSTOSCOPY/RETROGRADE/URETEROSCOPY.;  Surgeon: [name], MD;  Location: [facility];  Service: Urology;  Laterality: Bilateral;  . KNEE ARTHROSCOPY    . LEFT HEART CATHETERIZATION WITH CORONARY ANGIOGRAM N/A 02/04/2014   Procedure: LEFT HEART CATHETERIZATION WITH CORONARY ANGIOGRAM;  Surgeon: [name], MD;  Location: [place] CATH [facility];  Service: Cardiovascular;  Laterality: N/A;  severe one-vessel CAD, chronically occluded RCA with faint left-to-right collaterals;  aneurysmal LCFx with sluggish coronary flow;  normal LVSF w/ moderately elevated LVEDP (ostialOM2 20%, pOM3 20%, pD1 20%, mCFX 50%, diffuse 20% pCFX)  . TRANSTHORACIC ECHOCARDIOGRAM  11-22-2016   dr hochrein   ef 55-60%/ mild MR and TR/ moderate LAE   Social History   Socioeconomic History  . Marital status: Married    Spouse name: Not on file  .  Number of children: Not on file  . Years of education: Not on file  . Highest education level: Not on file  Occupational History  . Not on file  Social Needs  . Financial resource strain: Not on file  . Food insecurity:    Worry: Not on file    Inability: Not on file  . Transportation needs:    Medical: Not on file    Non-medical: Not on file  Tobacco Use  . Smoking status: Never Smoker  . Smokeless tobacco: Never Used  Substance and Sexual Activity  . Alcohol use: No  . Drug use: No   . Sexual activity: Not on file  Lifestyle  . Physical activity:    Days per week: Not on file    Minutes per session: Not on file  . Stress: Not on file  Relationships  . Social connections:    Talks on phone: Not on file    Gets together: Not on file    Attends religious service: Not on file    Active member of club or organization: Not on file    Attends meetings of clubs or organizations: Not on file    Relationship status: Not on file  . Intimate partner violence:    Fear of current or ex partner: Not on file    Emotionally abused: Not on file    Physically abused: Not on file    Forced sexual activity: Not on file  Other Topics Concern  . Not on file  Social History Narrative  . Not on file   Family History  Problem Relation Age of Onset  . Hypertension Mother    Immunization History  Administered Date(s) Administered  . DTaP 12/24/2014  . Influenza,inj,Quad PF,6+ Mos 07/27/2014  . Influenza-Unspecified 07/27/2014  . Pneumococcal Conjugate-13 05/22/2016  . Pneumococcal Polysaccharide-23 02/03/2014, 12/24/2014  . Tdap 02/03/2014, 12/24/2014, 07/07/2018   Outpatient Encounter Medications as of 08/23/2018  Medication Sig Note  . ASSURE COMFORT LANCETS 28G MISC Frequency:   Dosage:0     Instructions:  Note:check sugars fasting and if feeling sick   . atorvastatin (LIPITOR) 80 MG tablet Take 1 tablet (80 mg total) by mouth every evening.   . diphenhydramine-acetaminophen (TYLENOL PM) 25-500 MG TABS tablet Take 1 tablet by mouth at bedtime as needed.   Marland Kitchen ELIQUIS 5 MG TABS tablet Take 1 tablet (5 mg total) by mouth 2 (two) times daily.   . furosemide (LASIX) 20 MG tablet Take 1 tablet (20 mg total) by mouth daily. May take extra tab if needed for swelling   . isosorbide mononitrate (IMDUR) 30 MG 24 hr tablet Take 1 tablet (30 mg total) by mouth daily.   . metFORMIN (GLUCOPHAGE) 500 MG tablet TAKE 1 TABLET (500 MG TOTAL) BY MOUTH TWO (TWO) TIMES DAILY WITH A MEAL. NEEDS  ANNUAL APPOINTMENT FOR FURTHER REFILLS   . metoprolol tartrate (LOPRESSOR) 50 MG tablet Take 1 tablet (50 mg total) by mouth 2 (two) times daily.   . nitroGLYCERIN (NITROSTAT) 0.4 MG SL tablet Place 1 tablet under the tongue every 5 minutes times 3 doses as needed for chest pains, call 911 if 2nd dose does not help   . Olmesartan-amLODIPine-HCTZ (TRIBENZOR) 40-10-12.5 MG TABS Take 0.5 tablets by mouth daily.   Marland Kitchen HYDROcodone-acetaminophen (NORCO) 10-325 MG tablet Take 1-2 tablets by mouth every 4 (four) hours as needed for moderate pain. Maximum dose per 24 hours - 8 pills (Patient not taking: Reported on 08/23/2018)   . phenazopyridine (  PYRIDIUM) 200 MG tablet Take 1 tablet (200 mg total) by mouth 3 (three) times daily as needed for pain. (Patient not taking: Reported on 08/23/2018)   . traMADol (ULTRAM) 50 MG tablet Take 1 tablet (50 mg total) by mouth every 8 (eight) hours as needed. (Patient not taking: Reported on 08/23/2018) 03/15/2018: Not currently taking   No facility-administered encounter medications on file as of 08/23/2018.    Review of Systems  Constitutional: Negative for chills, fever, malaise/fatigue and weight loss.  HENT: Negative for congestion, ear discharge, ear pain, hearing loss and sore throat.   Eyes: Negative for blurred vision and double vision.  Respiratory: Positive for shortness of breath (with exertion). Negative for cough and wheezing.   Cardiovascular: Negative for chest pain, palpitations, orthopnea and PND.  Gastrointestinal: Positive for constipation. Negative for abdominal pain, blood in stool, diarrhea, heartburn, nausea and vomiting.  Genitourinary: Positive for frequency (nocturia). Negative for dysuria, hematuria (not currently but has had in the past) and urgency.  Musculoskeletal: Positive for back pain (Rt low back pain when he is on his feet for a long time) and joint pain (B/L knee pain - h/o OA).  Skin: Positive for rash.  Neurological: Negative for  dizziness, tingling, tremors, weakness and headaches.  Psychiatric/Behavioral: Negative for depression. The patient is not nervous/anxious and does not have insomnia.     Allergies  Allergen Reactions  . Xarelto [Rivaroxaban] Other (See Comments)    Skin Blisters    BP 118/88 (BP Location: Left Arm, Patient Position: Sitting, Cuff Size: Normal)   Pulse 67   Temp 98.7 F (37.1 C)   Ht 6' (1.829 m)   Wt 207 lb 3.2 oz (94 kg)   SpO2 97%   BMI 28.10 kg/m   General appearance: alert, appears stated age and no distress Head: Normocephalic, without obvious abnormality, atraumatic Ears: normal TM's and external ear canals both ears Nose: Nares normal. Septum midline. Mucosa normal. No drainage or sinus tenderness. Throat: lips, mucosa, and tongue normal; teeth and gums normal Neck: no adenopathy, no carotid bruit, no JVD, supple, symmetrical, trachea midline and thyroid not enlarged, symmetric, no tenderness/mass/nodules Lungs: clear to auscultation bilaterally Heart: regular rate and rhythm and S1, S2 normal Abdomen: soft, non-tender; bowel sounds normal; no masses,  no organomegaly GU: Lt testicular swelling/edema (chronic as per pt) Extremities: extremities normal, atraumatic, no cyanosis or edema Pulses: 2+ and symmetric Skin: inferior to Rt eye pt with apprx 1cm area of hyperpigmented rough/scaly skin Neurologic: Alert and oriented X 3, normal strength and tone. Normal symmetric reflexes. Normal coordination and gait   A/P: 1. Type 2 diabetes mellitus with stage 3 chronic kidney disease, without long-term current use of insulin ([place])   2. Annual physical exam   3. Chronic diastolic CHF (congestive heart failure), NYHA class 2 (HCC) Chronic  4. Malignant neoplasm of prostate (HCC) Chronic  5. Dyslipidemia   6. Essential hypertension   7. Need for influenza vaccination   8. Screening for skin cancer   - due for A1C, LFTs today; rest of labs UTD - cont current meds at this  time - pt has GI appt next week ahead of colonoscopy - BP at goal, cont current med regimen and stressed importance of low salt diet and regular CV exercise - high dose flu vaccine today, recommended pt call insurance co to find out cost of shingrix vaccine, pneumo vaccine UTD - cont regular f/u with cardio and urology - pt due for cardio appt and  will call to schedule - f/u in 3 mo or sooner PRN  I spent 45 min with the patient today and greater than 50% was spent in counseling, coordination of care, education

## 2018-08-23 NOTE — Patient Instructions (Signed)
Call insurance to ask about shingles vaccine (Shingrix) Check to see if you need any med refills

## 2018-08-24 LAB — HEMOGLOBIN A1C
Hgb A1c MFr Bld: 6.5 % of total Hgb — ABNORMAL HIGH (ref <5.7)
Mean Plasma Glucose: 140 (calc)
eAG (mmol/L): 7.7 (calc)

## 2018-08-24 LAB — AST: AST: 17 U/L (ref 10–35)

## 2018-08-24 LAB — ALT: ALT: 12 U/L (ref 9–46)

## 2018-08-27 ENCOUNTER — Telehealth: Payer: Self-pay

## 2018-08-27 NOTE — Telephone Encounter (Signed)
Copied from [place] [id number]. Topic: General - Other >> Aug 27, 2018  4:22 PM Leward Quan A wrote: Reason for CRM: Patient wife called to request a call with the results of his Labs done on 08/23/18. Ph# [phone]

## 2018-08-28 ENCOUNTER — Other Ambulatory Visit: Payer: Self-pay | Admitting: Family Medicine

## 2018-08-28 ENCOUNTER — Encounter: Payer: Self-pay | Admitting: Family Medicine

## 2018-08-28 MED ORDER — METFORMIN HCL 500 MG PO TABS: 500.0000 mg | ORAL_TABLET | Freq: Two times a day (BID) | ORAL | 3 refills | Status: DC

## 2018-08-28 NOTE — Progress Notes (Signed)
Referring Provider: Hoyt Koch, * Primary Care Physician:  Ronnald Nian, DO   Reason for Consultation:  Colon cancer screening   IMPRESSION:  No prior colon cancer screening Chronic Eliquis therapy CHF, CAD, NSTEMI x 2 most recently in 11/2016 Occassional Advil for knee pain  He is due colon cancer screening. No GI symptoms at this time. We discussed a colonoscopy as a colon cancer screening strategy at length including the risks, benefits, and alternatives. We also discussed risks and benefits to an Eliquis washout prior to proceeding with colonoscopy to for allow for the removal of polyps at the time of his endoscopy.  I discussed the low, but real, risk of a cardiovascular event such as heart attack, stroke, or embolism/thrombosis while off blood thinner.    PLAN: Proceed with colonoscopy if approved for Eliquis washout by his prescribing cardiologist.  I recommended that he discuss alternatives to NSAIDs for management of his arthralgias given his long-term use of Eliquis  I consented the patient at the bedside today discussing the risks, benefits, and alternatives to endoscopic evaluation. In particular, we discussed the risks that include, but are not limited to, reaction to medication, cardiopulmonary compromise, bleeding requiring blood transfusion, aspiration resulting in pneumonia, perforation requiring surgery, and even death. We reviewed the risk of missed lesion including polyps or even cancer. The patient acknowledges these risks and asks that we proceed.   HPI: Adam Harris is a 72 y.o. male who presents for surveillance colonoscopy. He has a significant cardiac hx with CHF, CAD, NSTEMI x 2 most recently in 11/2016. He is currently on Eliquis.   He had a flexible sigmoidoscopy at [facility] care 20 years ago. It was not a pleasant experience due to discomfort. No colon cancer screening since that time.   GI ROS is negative. His wife accompanies him to this  appointment.     Past Medical History:  Diagnosis Date  . Abnormal radiologic findings on diagnostic imaging of renal pelvis, ureter, or bladder    bilateral ureter abnormalities  . Anticoagulant long-term use    eliquis  . Arthritis   . CAD (coronary artery disease) cardiologist-- dr hochrein   NSTEMI 02-04-2014  per cardiac cath chronic occluded RCA w/ faint left-to-right collaterals and aneurysmal LCFx with sluggish coronary flow/   NSTEMI --11-21-2016 per cardiac cath occluded proximal RCA & mid to diastal CFX 100%, med rx. If that does not work, PTCA or CABG  . CKD (chronic kidney disease), stage III [facility])    patient unaware  . DOE (dyspnea on exertion)   . Hematuria   . History of non-ST elevation myocardial infarction (NSTEMI)    02-04-2014  and 11-21-2016  cardiac cath done both times ,  medically management  . History of shingles 12/2017   slight pain and numbness still noted in the area  . Hypertension   . Myocardial infarction ([place]) 2015  . Persistent atrial fibrillation    cardiologsit-- dr Percival Spanish  . Type 2 diabetes mellitus ([place])     Past Surgical History:  Procedure Laterality Date  . CARDIAC CATHETERIZATION N/A 11/21/2016   Procedure: Left Heart Cath and Coronary Angiography;  Surgeon: Troy Sine, MD;  Location: [place] [facility];  Service: Cardiovascular;  Laterality: N/A;  pRCA 100% , ostial LAD 45%, OM3 80%, mCFX to dCFX 100% (AV groove), lateral OM3 50%  . COLONOSCOPY    . CYSTOSCOPY/RETROGRADE/URETEROSCOPY Bilateral 03/18/2018   Procedure: CYSTOSCOPY/RETROGRADE/URETEROSCOPY.;  Surgeon: [name], MD;  Location:  [place];  Service: Urology;  Laterality: Bilateral;  . KNEE ARTHROSCOPY    . LEFT HEART CATHETERIZATION WITH CORONARY ANGIOGRAM N/A 02/04/2014   Procedure: LEFT HEART CATHETERIZATION WITH CORONARY ANGIOGRAM;  Surgeon: [name], MD;  Location: [place] CATH LAB;  Service: Cardiovascular;  Laterality: N/A;  severe one-vessel  CAD, chronically occluded RCA with faint left-to-right collaterals;  aneurysmal LCFx with sluggish coronary flow;  normal LVSF w/ moderately elevated LVEDP (ostialOM2 20%, pOM3 20%, pD1 20%, mCFX 50%, diffuse 20% pCFX)  . TRANSTHORACIC ECHOCARDIOGRAM  11-22-2016   dr hochrein   ef 55-60%/ mild MR and TR/ moderate LAE    Prior to Admission medications   Medication Sig Start Date End Date Taking? Authorizing Provider  ASSURE COMFORT LANCETS 28G MISC Frequency:   Dosage:0     Instructions:  Note:check sugars fasting and if feeling sick 02/17/14   [provider]  atorvastatin (LIPITOR) 80 MG tablet Take 1 tablet (80 mg total) by mouth every evening. 12/04/17   Minus Breeding, MD  diphenhydramine-acetaminophen (TYLENOL PM) 25-500 MG TABS tablet Take 1 tablet by mouth at bedtime as needed.    [provider]  ELIQUIS 5 MG TABS tablet Take 1 tablet (5 mg total) by mouth 2 (two) times daily. 02/04/18   Minus Breeding, MD  furosemide (LASIX) 20 MG tablet Take 1 tablet (20 mg total) by mouth daily. May take extra tab if needed for swelling 08/23/18 11/21/18  Minus Breeding, MD  HYDROcodone-acetaminophen (NORCO) 10-325 MG tablet Take 1-2 tablets by mouth every 4 (four) hours as needed for moderate pain. Maximum dose per 24 hours - 8 pills Patient not taking: Reported on 08/23/2018 03/18/18   [name], MD  isosorbide mononitrate (IMDUR) 30 MG 24 hr tablet Take 1 tablet (30 mg total) by mouth daily. 07/22/18   Minus Breeding, MD  metFORMIN (GLUCOPHAGE) 500 MG tablet TAKE 1 TABLET (500 MG TOTAL) BY MOUTH TWO (TWO) TIMES DAILY WITH A MEAL. NEEDS ANNUAL APPOINTMENT FOR FURTHER REFILLS 05/09/18   Hoyt Koch, MD  metoprolol tartrate (LOPRESSOR) 50 MG tablet Take 1 tablet (50 mg total) by mouth 2 (two) times daily. 02/12/18   Minus Breeding, MD  nitroGLYCERIN (NITROSTAT) 0.4 MG SL tablet Place 1 tablet under the tongue every 5 minutes times 3 doses as needed for chest pains, call 911 if 2nd  dose does not help 11/22/16   Barrett, Evelene Croon, PA-C  Olmesartan-amLODIPine-HCTZ (TRIBENZOR) 40-10-12.5 MG TABS Take 0.5 tablets by mouth daily. 03/06/18   Almyra Deforest, PA  phenazopyridine (PYRIDIUM) 200 MG tablet Take 1 tablet (200 mg total) by mouth 3 (three) times daily as needed for pain. Patient not taking: Reported on 08/23/2018 03/18/18   [name], MD  traMADol (ULTRAM) 50 MG tablet Take 1 tablet (50 mg total) by mouth every 8 (eight) hours as needed. Patient not taking: Reported on 08/23/2018 01/10/18   Hoyt Koch, MD    Current Outpatient Medications  Medication Sig Dispense Refill  . atorvastatin (LIPITOR) 80 MG tablet Take 1 tablet (80 mg total) by mouth every evening. 30 tablet 11  . ELIQUIS 5 MG TABS tablet Take 1 tablet (5 mg total) by mouth 2 (two) times daily. 180 tablet 1  . furosemide (LASIX) 20 MG tablet Take 1 tablet (20 mg total) by mouth daily. May take extra tab if needed for swelling 30 tablet 3  . isosorbide mononitrate (IMDUR) 30 MG 24 hr tablet Take 1 tablet (30 mg total) by mouth  daily. 90 tablet 1  . metFORMIN (GLUCOPHAGE) 500 MG tablet Take 1 tablet (500 mg total) by mouth 2 (two) times daily with a meal. 180 tablet 3  . metoprolol tartrate (LOPRESSOR) 50 MG tablet Take 1 tablet (50 mg total) by mouth 2 (two) times daily. 60 tablet 11  . nitroGLYCERIN (NITROSTAT) 0.4 MG SL tablet Place 1 tablet under the tongue every 5 minutes times 3 doses as needed for chest pains, call 911 if 2nd dose does not help 25 tablet 6  . Olmesartan-amLODIPine-HCTZ (TRIBENZOR) 40-10-12.5 MG TABS Take 0.5 tablets by mouth daily. 30 tablet 6  . diphenhydramine-acetaminophen (TYLENOL PM) 25-500 MG TABS tablet Take 1 tablet by mouth at bedtime as needed.     No current facility-administered medications for this visit.     Allergies as of 08/29/2018 - Review Complete 08/29/2018  Allergen Reaction Noted  . Xarelto [rivaroxaban] Other (See Comments) 12/01/2016    Family History    Problem Relation Age of Onset  . Hypertension Mother     Social History   Socioeconomic History  . Marital status: Married    Spouse name: Not on file  . Number of children: Not on file  . Years of education: Not on file  . Highest education level: Not on file  Occupational History  . Not on file  Social Needs  . Financial resource strain: Not on file  . Food insecurity:    Worry: Not on file    Inability: Not on file  . Transportation needs:    Medical: Not on file    Non-medical: Not on file  Tobacco Use  . Smoking status: Never Smoker  . Smokeless tobacco: Never Used  Substance and Sexual Activity  . Alcohol use: No  . Drug use: No  . Sexual activity: Not on file  Lifestyle  . Physical activity:    Days per week: Not on file    Minutes per session: Not on file  . Stress: Not on file  Relationships  . Social connections:    Talks on phone: Not on file    Gets together: Not on file    Attends religious service: Not on file    Active member of club or organization: Not on file    Attends meetings of clubs or organizations: Not on file    Relationship status: Not on file  . Intimate partner violence:    Fear of current or ex partner: Not on file    Emotionally abused: Not on file    Physically abused: Not on file    Forced sexual activity: Not on file  Other Topics Concern  . Not on file  Social History Narrative  . Not on file    Review of Systems: 12 system ROS is negative except as noted above with the addition of: back pain, fatigue, insomnia.   Physical Exam:   General:   Alert, well-nourished, pleasant and cooperative in NAD. Appears his stated age.  Head:  Normocephalic and atraumatic. Eyes:  Sclera clear, no icterus.   Conjunctiva pink. Lungs:  Clear throughout to auscultation.   No wheezes. Heart:  Regular rate and rhythm; no murmurs Abdomen:  Soft, nontender, normal bowel sounds. No rebound or guarding. No hepatosplenomegaly Extremities:  No  gross deformities or edema. Neurologic:  Alert and  oriented x4;  grossly nonfocal Skin:  No rash or bruise. Psych:  Alert and cooperative. Normal mood and affect.    Brittanee Ghazarian L. Tarri Glenn, Md, MPH [facility]  08/29/2018, 2:15 PM

## 2018-08-28 NOTE — Telephone Encounter (Signed)
Informed pt of lab results. Agrees to take metformin 550mg  BID, requests refill be sent to [facility]. Pt states he is out of that medication.

## 2018-08-28 NOTE — Telephone Encounter (Signed)
Refill sent as requested. 

## 2018-08-29 ENCOUNTER — Telehealth: Payer: Self-pay | Admitting: Emergency Medicine

## 2018-08-29 ENCOUNTER — Encounter: Payer: Self-pay | Admitting: Cardiology

## 2018-08-29 ENCOUNTER — Ambulatory Visit (INDEPENDENT_AMBULATORY_CARE_PROVIDER_SITE_OTHER): Payer: Medicare Other | Admitting: Gastroenterology

## 2018-08-29 VITALS — BP 118/68 | HR 78 | Ht 71.0 in | Wt 209.0 lb

## 2018-08-29 DIAGNOSIS — Z7901 Long term (current) use of anticoagulants: Secondary | ICD-10-CM | POA: Diagnosis not present

## 2018-08-29 DIAGNOSIS — Z1211 Encounter for screening for malignant neoplasm of colon: Secondary | ICD-10-CM | POA: Diagnosis not present

## 2018-08-29 NOTE — Telephone Encounter (Signed)
[facility] Pre-operative Risk Assessment     Request for surgical clearance:     Endoscopy Procedure  What type of surgery is being performed?     colonoscopy  When is this surgery scheduled?     TBD  What type of clearance is required ?   Pharmacy  Are there any medications that need to be held prior to surgery and how long? Eliquis 2 days  Practice name and name of physician performing surgery?      [facility]  What is your office phone and fax number?      Phone- [phone]  [phone]  Anesthesia type (None, local, MAC, general) ?       MAC

## 2018-08-29 NOTE — Progress Notes (Signed)
Cardiology Office Note   Date:  08/30/2018   ID:  Adam Harris, DOB [birth year], MRN [id number]  PCP:  Ronnald Nian, DO  Cardiologist:   Minus Breeding, MD    Chief Complaint  Patient presents with  . Atrial Fibrillation      History of Present Illness: Adam Harris is a 72 y.o. male who presents for atrial fib and CAD.   He was in atrial fibrillation was being considered for cardioversion but he apparently went back in sinus rhythm.  He says he is feeling well.  He does not notice any palpitations today although he is in fibrillation.  He says he does feel like he goes in and out sometimes but is not particularly symptomatic.  He denies any chest pressure, neck or arm discomfort.  He has had no new shortness of breath, PND or orthopnea.  He has had no weight gain.  He still works part-time at the [employer] and and he takes care of his mom who has dementia.  He has to climb some stairs and he has a little dyspnea with this.   Past Medical History:  Diagnosis Date  . Abnormal radiologic findings on diagnostic imaging of renal pelvis, ureter, or bladder    bilateral ureter abnormalities  . Anticoagulant long-term use    eliquis  . Arthritis   . CAD (coronary artery disease) cardiologist-- dr Lorenzo Pereyra   NSTEMI 02-04-2014  per cardiac cath chronic occluded RCA w/ faint left-to-right collaterals and aneurysmal LCFx with sluggish coronary flow/   NSTEMI --11-21-2016 per cardiac cath occluded proximal RCA & mid to diastal CFX 100%, med rx. If that does not work, PTCA or CABG  . CKD (chronic kidney disease), stage III [facility])    patient unaware  . DOE (dyspnea on exertion)   . Hematuria   . History of non-ST elevation myocardial infarction (NSTEMI)    02-04-2014  and 11-21-2016  cardiac cath done both times ,  medically management  . History of shingles 12/2017   slight pain and numbness still noted in the area  . Hypertension   . Myocardial infarction ([place]) 2015  . Persistent atrial  fibrillation    cardiologsit-- dr Percival Spanish  . Type 2 diabetes mellitus ([place])     Past Surgical History:  Procedure Laterality Date  . CARDIAC CATHETERIZATION N/A 11/21/2016   Procedure: Left Heart Cath and Coronary Angiography;  Surgeon: Troy Sine, MD;  Location: [place] [facility];  Service: Cardiovascular;  Laterality: N/A;  pRCA 100% , ostial LAD 45%, OM3 80%, mCFX to dCFX 100% (AV groove), lateral OM3 50%  . COLONOSCOPY    . CYSTOSCOPY/RETROGRADE/URETEROSCOPY Bilateral 03/18/2018   Procedure: CYSTOSCOPY/RETROGRADE/URETEROSCOPY.;  Surgeon: [name], MD;  [facility];  Service: Urology;  Laterality: Bilateral;  . KNEE ARTHROSCOPY    . LEFT HEART CATHETERIZATION WITH CORONARY ANGIOGRAM N/A 02/04/2014   Procedure: LEFT HEART CATHETERIZATION WITH CORONARY ANGIOGRAM;  Surgeon: [name], MD;  Location: [place] CATH LAB;  Service: Cardiovascular;  Laterality: N/A;  severe one-vessel CAD, chronically occluded RCA with faint left-to-right collaterals;  aneurysmal LCFx with sluggish coronary flow;  normal LVSF w/ moderately elevated LVEDP (ostialOM2 20%, pOM3 20%, pD1 20%, mCFX 50%, diffuse 20% pCFX)  . TRANSTHORACIC ECHOCARDIOGRAM  11-22-2016   dr Gates Jividen   ef 55-60%/ mild MR and TR/ moderate LAE     Current Outpatient Medications  Medication Sig Dispense Refill  . atorvastatin (LIPITOR) 80 MG tablet Take 1 tablet (  80 mg total) by mouth every evening. 30 tablet 11  . diphenhydramine-acetaminophen (TYLENOL PM) 25-500 MG TABS tablet Take 1 tablet by mouth at bedtime as needed.    Marland Kitchen ELIQUIS 5 MG TABS tablet Take 1 tablet (5 mg total) by mouth 2 (two) times daily. 180 tablet 1  . furosemide (LASIX) 20 MG tablet Take 1 tablet (20 mg total) by mouth daily. May take extra tab if needed for swelling 30 tablet 3  . isosorbide mononitrate (IMDUR) 30 MG 24 hr tablet Take 1 tablet (30 mg total) by mouth daily. 90 tablet 1  . metFORMIN (GLUCOPHAGE) 500 MG tablet Take 1  tablet (500 mg total) by mouth 2 (two) times daily with a meal. 180 tablet 3  . metoprolol tartrate (LOPRESSOR) 50 MG tablet Take 1 tablet (50 mg total) by mouth 2 (two) times daily. 60 tablet 11  . nitroGLYCERIN (NITROSTAT) 0.4 MG SL tablet Place 1 tablet under the tongue every 5 minutes times 3 doses as needed for chest pains, call 911 if 2nd dose does not help 25 tablet 6  . Olmesartan-amLODIPine-HCTZ (TRIBENZOR) 40-10-12.5 MG TABS Take 0.5 tablets by mouth daily. 30 tablet 6   No current facility-administered medications for this visit.     Allergies:   Xarelto [rivaroxaban]     ROS:  Please see the history of present illness.   Otherwise, review of systems are negative for all other.   All other systems are reviewed and negative.    PHYSICAL EXAM: VS:  BP (!) 158/100   Pulse 62   Ht 5\' 11"  (1.803 m)   Wt 210 lb 12.8 oz (95.6 kg)   BMI 29.40 kg/m  , BMI Body mass index is 29.4 kg/m.  GENERAL:  Well appearing NECK:  No jugular venous distention, waveform within normal limits, carotid upstroke brisk and symmetric, no bruits, no thyromegaly LUNGS:  Clear to auscultation bilaterally CHEST:  Unremarkable HEART:  PMI not displaced or sustained,S1 and S2 within normal limits, no S3, no clicks, no rubs, no murmurs, irregular ABD:  Flat, positive bowel sounds normal in frequency in pitch, no bruits, no rebound, no guarding, no midline pulsatile mass, no hepatomegaly, no splenomegaly EXT:  2 plus pulses throughout, no edema, no cyanosis no clubbing    EKG:  EKG is  ordered today. Atrial fibrillation, rate 62, axis within normal limits, intervals within normal limits, nonspecific anterior T wave changes.   Recent Labs: 01/09/2018: Platelets 132 03/18/2018: BUN 27; Creatinine, Ser 1.30; Hemoglobin 12.9; Potassium 3.8; Sodium 141 08/23/2018: ALT 12    Lipid Panel    Component Value Date/Time   CHOL 124 11/21/2016 0551   TRIG 97 11/21/2016 0551   HDL 31 (L) 11/21/2016 0551    CHOLHDL 4.0 11/21/2016 0551   VLDL 19 11/21/2016 0551   LDLCALC 74 11/21/2016 0551      Wt Readings from Last 3 Encounters:  08/30/18 210 lb 12.8 oz (95.6 kg)  08/29/18 209 lb (94.8 kg)  08/23/18 207 lb 3.2 oz (94 kg)      Other studies Reviewed: Additional studies/ records that were reviewed today include: Labs. Review of the above records demonstrates:      ASSESSMENT AND PLAN:  ATRIAL FIB:  [name] has a CHA2DS2 - VASc score of 3.    He is not really noticing this.  He tolerates anticoagulation.  No change in therapy is indicated.  CAD:   Occlusive disease managed medically.   He is having no symptoms.  No change in therapy is indicated.  HTN:  The blood pressure is slightly elevated.  He can check a blood pressure diary and use his mom's blood pressure cuff.  Further med changes will be based on this.   CHRONIC DIASTOLIC HF:  He is euvolemic.  No change in therapy is indicated.   DYSLIPIDEMIA:  He needs a repeat lipid profile.  He was at target last year.      Current medicines are reviewed at length with the patient today.  The patient does not have concerns regarding medicines.  The following changes have been made:  None  Labs/ tests ordered today include:    Orders Placed This Encounter  Procedures  . Lipid panel  . EKG 12-Lead     Disposition:   FU with me in one year.    Signed, Minus Breeding, MD  08/30/2018 2:24 PM    [place]

## 2018-08-29 NOTE — Patient Instructions (Addendum)
Tips for colonoscopy:  -Stay well hydrated for 3-4 days prior to the exam. This reduces nausea and dehydration.  -To prevent skin and hemorrhoid irritation: prior to wiping, put A&Dointment or vaseline on the toilet paper. -Keep a towel or pad on the bed.  -Drink 64oz of clear liquids in the morning of prep day (prior to starting the prep) to be sure that there is enough fluid to flush the colon and stay hydrated!!!! This is in addition to the fluids required for preparation.  It has been recommended to you by your physician that you have a(n) colonscopy completed. Per your request, we did not schedule the procedure(s) today. Please contact our office at [phone] should you decide to have the procedure completed.

## 2018-08-30 ENCOUNTER — Ambulatory Visit (INDEPENDENT_AMBULATORY_CARE_PROVIDER_SITE_OTHER): Payer: Medicare Other | Admitting: Cardiology

## 2018-08-30 ENCOUNTER — Encounter: Payer: Self-pay | Admitting: Cardiology

## 2018-08-30 VITALS — BP 158/100 | HR 62 | Ht 71.0 in | Wt 210.8 lb

## 2018-08-30 DIAGNOSIS — I10 Essential (primary) hypertension: Secondary | ICD-10-CM | POA: Diagnosis not present

## 2018-08-30 DIAGNOSIS — I2510 Atherosclerotic heart disease of native coronary artery without angina pectoris: Secondary | ICD-10-CM | POA: Diagnosis not present

## 2018-08-30 DIAGNOSIS — I480 Paroxysmal atrial fibrillation: Secondary | ICD-10-CM

## 2018-08-30 DIAGNOSIS — E785 Hyperlipidemia, unspecified: Secondary | ICD-10-CM | POA: Diagnosis not present

## 2018-08-30 NOTE — Patient Instructions (Signed)
Medication Instructions:  Continue current medications  If you need a refill on your cardiac medications before your next appointment, please call your pharmacy.  Labwork: Fasting Lipids HERE IN OUR OFFICE AT [facility]  Take the provided lab slips with you to the lab for your blood draw.   You will need to fast. DO NOT EAT OR DRINK PAST MIDNIGHT.    If you have labs (blood work) drawn today and your tests are completely normal, you will receive your results only by: Marland Kitchen MyChart Message (if you have MyChart) OR . A paper copy in the mail If you have any lab test that is abnormal or we need to change your treatment, we will call you to review the results.  Testing/Procedures: None Ordered   Follow-Up: You will need a follow up appointment in 6 Months.  Please call our office 2 months in advance([phone]) to schedule the appointment.  You may see  DR Percival Spanish or one of the following Advanced Practice Providers on your designated Care Team:   . Jory Sims, DNP, ANP . Rhonda Barrett, PA-C .  Marland Kitchen Kerin Ransom, PA-C . Daleen Snook Kroeger, PA-C . Sande Rives, PA-C .  Marland Kitchen Almyra Deforest, PA-C . Fabian Sharp, PA-C  At [facility], you and your health needs are our priority.  As part of our continuing mission to provide you with exceptional heart care, we have created designated Provider Care Teams.  These Care Teams include your primary Cardiologist (physician) and Advanced Practice Providers (APPs -  Physician Assistants and Nurse Practitioners) who all work together to provide you with the care you need, when you need it.   Thank you for choosing [facility] at [facility]!!

## 2018-08-30 NOTE — Telephone Encounter (Signed)
Pt takes Eliquis for afib with CHADS2VASc score of 5 (age, HTN, CHF, DM, CAD). CrCl is 80mL/min. Ok to hold Eliquis for 1-2 days prior to procedure.

## 2018-10-23 ENCOUNTER — Ambulatory Visit: Payer: Medicare Other | Admitting: Family Medicine

## 2018-10-25 ENCOUNTER — Ambulatory Visit: Payer: Medicare Other | Admitting: Family Medicine

## 2018-11-19 ENCOUNTER — Telehealth: Payer: Self-pay | Admitting: Cardiology

## 2018-11-19 NOTE — Telephone Encounter (Signed)
Returned call to patient's wife who is concerned since patient took NTG twice over the week. She states it is hard to get any info out of him and he did not want to speak to nurse on the phone despite wife's attempts. She reports she was SOB after going up the steps and had a little chest pain. He had ran out of imdur for a few days. She reports he is feeling better now and is back on medication   He is scheduled for an appt with Arnold Long DNP on 11/26/18.  Advised that if he has recurrent chest pain that is worse in intensity for frequency, he should seek ED evaluation.   Routed to MD as Juluis Rainier

## 2018-11-19 NOTE — Telephone Encounter (Signed)
New Message    Pt's wife called and says he  took nitroglycerin 2 times over the weekend. She said she is unaware of any pain he is having. She scheduled him an appt. Please call

## 2018-11-26 ENCOUNTER — Encounter: Payer: Self-pay | Admitting: Adult Health

## 2018-11-26 ENCOUNTER — Ambulatory Visit (INDEPENDENT_AMBULATORY_CARE_PROVIDER_SITE_OTHER): Payer: Medicare Other | Admitting: Adult Health

## 2018-11-26 VITALS — BP 130/78 | HR 76 | Ht 71.0 in | Wt 208.6 lb

## 2018-11-26 DIAGNOSIS — E785 Hyperlipidemia, unspecified: Secondary | ICD-10-CM | POA: Diagnosis not present

## 2018-11-26 DIAGNOSIS — Z79899 Other long term (current) drug therapy: Secondary | ICD-10-CM | POA: Diagnosis not present

## 2018-11-26 DIAGNOSIS — R079 Chest pain, unspecified: Secondary | ICD-10-CM | POA: Diagnosis not present

## 2018-11-26 DIAGNOSIS — I4891 Unspecified atrial fibrillation: Secondary | ICD-10-CM | POA: Diagnosis not present

## 2018-11-26 NOTE — Patient Instructions (Signed)
Medication Instructions:  NO CHANGES- Your physician recommends that you continue on your current medications as directed. Please refer to the Current Medication list given to you today. If you need a refill on your cardiac medications before your next appointment, please call your pharmacy.  Labwork: A1C,BMET,CBC,FLP AND LFT  HERE IN OUR OFFICE AT [facility]   You will need to fast. DO NOT EAT OR DRINK PAST MIDNIGHT.      Take the provided lab slips with you to the lab for your blood draw.    When you have your labs (blood work) drawn today and your tests are completely normal, you will receive your results only by MyChart Message (if you have MyChart) -OR-  A paper copy in the mail.  If you have any lab test that is abnormal or we need to change your treatment, we will call you to review these results.  Follow-Up: You will need a follow up appointment in [place] . You may see Minus Breeding, MD or one of the following Advanced Practice Providers on your designated Care Team:  Jory Sims, DNP, AACC  Rosaria Ferries, PA-C  At [facility], you and your health needs are our priority.  As part of our continuing mission to provide you with exceptional heart care, we have created designated Provider Care Teams.  These Care Teams include your primary Cardiologist (physician) and Advanced Practice Providers (APPs -  Physician Assistants and Nurse Practitioners) who all work together to provide you with the care you need, when you need it.  Thank you for choosing [facility] at [facility]!!

## 2018-11-26 NOTE — Progress Notes (Signed)
Cardiology Office Note   Date:  11/26/2018   ID:  Tip Atienza, DOB 1946-07-29, MRN [id number]  PCP:  Louie Casa, DO  Cardiologist: Dr. Percival Spanish  Chief Complaint  Patient presents with  . Atrial Fibrillation  . Coronary Artery Disease     History of Present Illness: Adam Harris is a 73 y.o. male who presents for ongoing assessment and management of atrial fib, converted to NSR on pharmacological management. CAD with most recent cardiac cath  01/2014 revealing severe 1 vessel disease with CTO RCA with faint left to right collaterals, aneurysmal LCFx with sluggish coronary flow;  normal LVSF w/ moderately elevated LVEDP (ostialOM2 20%, pOM3 20%, pD1 20%, mCFX 50%, diffuse 20% pCFX). Chronic diastolic CHF and HL.    He was last seen on 08/30/2018 and was stable, no complaints of racing HR or palpitations or chest pain. He was found to be euvolemic.   The patient's wife called our office on 11/19/2017 reporting that the patient had taken NTG X 2 over that last week. He was more dyspneic when going up the stairs, and had some chest discomfort associated with this. He had run out of Imdur for a few days but was back on it  He was scheduled today for follow up.   He admits that he is not taking his medications as directed, often forgetting to take his medications or taking them at different times. He states that he can tell when he forgets because he has some discomfort or dyspnea. He continues to work in his yard but climbing stairs continues to be a challenge.   Past Medical History:  Diagnosis Date  . Abnormal radiologic findings on diagnostic imaging of renal pelvis, ureter, or bladder    bilateral ureter abnormalities  . Anticoagulant long-term use    eliquis  . Arthritis   . CAD (coronary artery disease) cardiologist-- dr hochrein   NSTEMI 02-04-2014  per cardiac cath chronic occluded RCA w/ faint left-to-right collaterals and aneurysmal LCFx with sluggish coronary flow/   NSTEMI  --11-21-2016 per cardiac cath occluded proximal RCA & mid to diastal CFX 100%, med rx. If that does not work, PTCA or CABG  . CKD (chronic kidney disease), stage III [facility])    patient unaware  . DOE (dyspnea on exertion)   . Hematuria   . History of non-ST elevation myocardial infarction (NSTEMI)    02-04-2014  and 11-21-2016  cardiac cath done both times ,  medically management  . History of shingles 12/2017   slight pain and numbness still noted in the area  . Hypertension   . Myocardial infarction ([place]) 2015  . Persistent atrial fibrillation    cardiologsit-- dr Percival Spanish  . Type 2 diabetes mellitus ([place])     Past Surgical History:  Procedure Laterality Date  . CARDIAC CATHETERIZATION N/A 11/21/2016   Procedure: Left Heart Cath and Coronary Angiography;  Surgeon: Troy Sine, MD;  Location: [place] [facility];  Service: Cardiovascular;  Laterality: N/A;  pRCA 100% , ostial LAD 45%, OM3 80%, mCFX to dCFX 100% (AV groove), lateral OM3 50%  . COLONOSCOPY    . CYSTOSCOPY/RETROGRADE/URETEROSCOPY Bilateral 03/18/2018   Procedure: CYSTOSCOPY/RETROGRADE/URETEROSCOPY.;  Surgeon: [name], MD;  Location: [facility];  Service: Urology;  Laterality: Bilateral;  . KNEE ARTHROSCOPY    . LEFT HEART CATHETERIZATION WITH CORONARY ANGIOGRAM N/A 02/04/2014   Procedure: LEFT HEART CATHETERIZATION WITH CORONARY ANGIOGRAM;  Surgeon: [name], MD;  Location: [place] CATH LAB;  Service:  Cardiovascular;  Laterality: N/A;  severe one-vessel CAD, chronically occluded RCA with faint left-to-right collaterals;  aneurysmal LCFx with sluggish coronary flow;  normal LVSF w/ moderately elevated LVEDP (ostialOM2 20%, pOM3 20%, pD1 20%, mCFX 50%, diffuse 20% pCFX)  . TRANSTHORACIC ECHOCARDIOGRAM  11-22-2016   dr hochrein   ef 55-60%/ mild MR and TR/ moderate LAE     Current Outpatient Medications  Medication Sig Dispense Refill  . atorvastatin (LIPITOR) 80 MG tablet Take 1 tablet (80 mg total)  by mouth every evening. 30 tablet 11  . diphenhydramine-acetaminophen (TYLENOL PM) 25-500 MG TABS tablet Take 1 tablet by mouth at bedtime as needed.    Marland Kitchen ELIQUIS 5 MG TABS tablet Take 1 tablet (5 mg total) by mouth 2 (two) times daily. 180 tablet 1  . isosorbide mononitrate (IMDUR) 30 MG 24 hr tablet Take 1 tablet (30 mg total) by mouth daily. 90 tablet 1  . metFORMIN (GLUCOPHAGE) 500 MG tablet Take 1 tablet (500 mg total) by mouth 2 (two) times daily with a meal. 180 tablet 3  . metoprolol tartrate (LOPRESSOR) 50 MG tablet Take 1 tablet (50 mg total) by mouth 2 (two) times daily. 60 tablet 11  . nitroGLYCERIN (NITROSTAT) 0.4 MG SL tablet Place 1 tablet under the tongue every 5 minutes times 3 doses as needed for chest pains, call 911 if 2nd dose does not help 25 tablet 6  . Olmesartan-amLODIPine-HCTZ (TRIBENZOR) 40-10-12.5 MG TABS Take 0.5 tablets by mouth daily. 30 tablet 6  . furosemide (LASIX) 20 MG tablet Take 1 tablet (20 mg total) by mouth daily. May take extra tab if needed for swelling 30 tablet 3   No current facility-administered medications for this visit.     Allergies:   Xarelto [rivaroxaban]    Social History:  The patient  reports that he has never smoked. He has never used smokeless tobacco. He reports that he does not drink alcohol or use drugs.   Family History:  The patient's family history includes Hypertension in his mother.    ROS: All other systems are reviewed and negative. Unless otherwise mentioned in H&P    PHYSICAL EXAM: VS:  BP 130/78 (BP Location: Left Arm, Patient Position: Sitting)   Pulse 76   Ht 5\' 11"  (1.803 m)   Wt 208 lb 9.6 oz (94.6 kg)   BMI 29.09 kg/m  , BMI Body mass index is 29.09 kg/m. GEN: Well nourished, well developed, in no acute distress HEENT: normal Neck: no JVD, carotid bruits, or masses Cardiac: RRR; no murmurs, rubs, or gallops,no edema  Respiratory:  Clear to auscultation bilaterally, normal work of breathing GI: soft,  nontender, nondistended, + BS MS: no deformity or atrophy Skin: warm and dry, no rash Neuro:  Strength and sensation are intact Psych: euthymic mood, full affect   EKG: Atrial fibrillation, rate of 76 bpm.   Recent Labs: 01/09/2018: Platelets 132 03/18/2018: BUN 27; Creatinine, Ser 1.30; Hemoglobin 12.9; Potassium 3.8; Sodium 141 08/23/2018: ALT 12    Lipid Panel    Component Value Date/Time   CHOL 124 11/21/2016 0551   TRIG 97 11/21/2016 0551   HDL 31 (L) 11/21/2016 0551   CHOLHDL 4.0 11/21/2016 0551   VLDL 19 11/21/2016 0551   LDLCALC 74 11/21/2016 0551      Wt Readings from Last 3 Encounters:  11/26/18 208 lb 9.6 oz (94.6 kg)  08/30/18 210 lb 12.8 oz (95.6 kg)  08/29/18 209 lb (94.8 kg)      Other studies  Reviewed: Cardiac Cath 11/21/2016 Conclusion     Prox RCA lesion, 100 %stenosed.  Ost LAD lesion, 45 %stenosed.  3rd Mrg lesion, 80 %stenosed.  Mid Cx to Dist Cx lesion, 100 %stenosed.  Lat 3rd Mrg lesion, 50 %stenosed.   Multivessel CAD: The LAD had ostial 40 to less than 50% focal stenosis.  There were mild luminal irregularities in the LAD, which extended to the apex but otherwise was without additional significant obstructive disease.  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  The OM1 vessel had 80% stenosis before giving rise to a branch and then had 50% stenosis in the inferior limb.  The AV groove circumflex was 99/100% occluded immediately after the large aneurysmal segment and  there was faint filling of a very large an additional aneurysmal segment with thrombus and very faint filling of the distal posterolateral branches.  There was faint collateralization of the distal posterolateral branches via the inferior branch of the obtuse marginal vessel.  The RCA was totally  occluded proximally as had been previously demonstrated and there were faint bridging collaterals supplying the mid RCA which was a nondominant vessel distally.  LVEDP 18 mm Hg.  RECOMMENDATION: Angiographic findings were reviewed with Dr. Tamala Julian.  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  His RCA was totally occluded at that time.  His RCA is essentially unchanged.  His AV groove circumflex is now occluded, which most likely is the culprit lesion.  The patient had been inadequately been taking his anticoagulation.  Heparin will be resumed tonight with plans to resume oral anticoagulation.  Initially, the patient will be treated with increasing medical therapy.  He is completely pain-free.  He will be started on nitrates, in addition to increased beta blocker therapy.  He is not a candidate for stenting due to the significant aneurysmal dilatations throughout his vessels.  If he develops increasing symptomatology, consider possible PTCA and/or evaluation for surgical revascularization.    Echocardiogram 11/22/2016 Left ventricle: The cavity size was normal. Systolic function was   normal. The estimated ejection fraction was in the range of 55%   to 60%. Wall motion was normal; there were no regional wall   motion abnormalities. - Aortic valve: Trileaflet; moderately thickened, moderately   calcified leaflets. - Mitral valve: There was mild regurgitation. - Left atrium: The atrium was moderately dilated. - Tricuspid valve: There was mild regurgitation. - Pulmonary arteries: Systolic pressure was mildly increased. PA   peak pressure: 37 mm Hg (S).  Impressions:  - Compared to the prior study, there has been no significant   interval change.  ASSESSMENT AND PLAN:  1. Chest pain: Multiple etiology for this, with know CAD, inconsistent medication dosing at home, and stress. He states that  he often forgets to take his medication regularly. He has a pill dispenser that he states he needs to start using again. I have suggested "pill packs" which can be filled for him by the pharmacy. He is interested in this but wants to wait and think about it.   I have advised him to take his medications as directed and encouraged him to use whatever method helps him to remember to take them. I will check BMET, CBC to evaluate for abnormalities  Last labs were in July of 2019,  2. Atrial fib: He is rate controlled at this time. He is on Eliquis BID. He sometimes forgets the evening dose. I have explained the importance of taking the anticoagulant as directed to avoid stroke. He verbalizes understanding. I think the pill pack would be best for him, if he is willing to change to this, especially in light of high risk for CVA.   3. CAD: Per cath above, not a candidate for PCI per 2018 recommendations due to significant aneurysmal dilatations. Consideration for revascularization if symptoms persist. Will defer to Dr.Hochrein for recommendations to repeat cath.   4. Diabetes: He will have Hgb A1c completed with labs.   5. Hyperlipidemia: Continue atorvastatin 40 mg daily. I will check lipids with goal of LDL < 70.    Current medicines are reviewed at length with the patient today.    Labs/ tests ordered today include: BMET, CBC, Hgb A1c, Lipids and LFT's   Phill Myron. West Pugh, ANP, AACC   11/26/2018 4:21 PM    [address] Office [phone] Fax [phone]

## 2018-12-03 ENCOUNTER — Telehealth: Payer: Self-pay | Admitting: Cardiology

## 2018-12-03 NOTE — Telephone Encounter (Signed)
Spoke to patient. Informed patient the medication is generic and no samples ar available.   Patient states the medication has gone up in price and he does not know if he can afford medication.patient states he and his wife have several medication that are over $100 dollars.   RN informed patient 1 or 2 things have happen 1-  No longer on formulary  2- need to meet his deductible until price goes down.  RN informed patient to contact insurance company--to see if that is the issue also medication could be separate if necessary. Patient may call back if assistance is needed.  Patient verbalized understanding.   Patient states he will call insurance tomoorow

## 2018-12-03 NOTE — Telephone Encounter (Signed)
New Message          Patient calling the office for samples of medication:   1.  What medication and dosage are you requesting samples for?Tribenzor  2.  Are you currently out of this medication? Yes

## 2018-12-04 ENCOUNTER — Telehealth: Payer: Self-pay

## 2018-12-04 NOTE — Telephone Encounter (Signed)
Left Pt.  A VM to call back need to inform pt. That we do not carry these medications in the office.  Copied from [place]. Topic: General - Inquiry >> Dec 04, 2018 12:25 PM Virl Axe D wrote: Reason for CRM: Pt called to see if there are any samples of tribenzor and eliquis that he could pick up. Please advise.

## 2018-12-06 ENCOUNTER — Telehealth: Payer: Self-pay | Admitting: Cardiology

## 2018-12-06 NOTE — Telephone Encounter (Signed)
Left message to call back  

## 2018-12-06 NOTE — Telephone Encounter (Signed)
Pt c/o medication issue:  1. Name of Medication: TRIBENZOR   2. How are you currently taking this medication (dosage and times per day)? 1 daily  3. Are you having a reaction (difficulty breathing--STAT)? NO   4. What is your medication issue? Need a replacement to expensive please give pt a call back.

## 2018-12-09 ENCOUNTER — Telehealth: Payer: Self-pay | Admitting: Cardiology

## 2018-12-09 NOTE — Telephone Encounter (Signed)
New Message        Patient is calling today to see if we can switch "Olmesartan" to something cheaper, patient has been without for two weeks

## 2018-12-09 NOTE — Telephone Encounter (Signed)
Spoke to wife states she is concerned   patient has not had medication in several weeks. Informed wife RN had discuss with patient about medication. Patient states he wanted to call insurance to see if something is cheaper  wife verbalized understanding.

## 2018-12-09 NOTE — Telephone Encounter (Signed)
Spoke with patient. He states the medication is over $100 for pill. Informed patient this mediation a combination pill of 3 different medication in one.  RN INFORMED PATIENT MEDICATION COULD BE  SEPARATED IF NEEDED. Patient wanted to know the cost , RN informed patient office does not know the price for medication , but patient can call insurance to see what is on his formulary and contact office  for the change. Patient verbalized understanding and states he will call back

## 2019-01-11 ENCOUNTER — Other Ambulatory Visit: Payer: Self-pay | Admitting: Cardiology

## 2019-02-07 ENCOUNTER — Other Ambulatory Visit: Payer: Self-pay | Admitting: Cardiology

## 2019-02-10 ENCOUNTER — Telehealth: Payer: Self-pay | Admitting: *Deleted

## 2019-02-10 NOTE — Telephone Encounter (Signed)
Try calling pt several times with no call back, pt appt was cancel

## 2019-02-11 ENCOUNTER — Telehealth: Payer: Medicare Other | Admitting: Cardiology

## 2019-02-12 DIAGNOSIS — R319 Hematuria, unspecified: Secondary | ICD-10-CM

## 2019-02-12 HISTORY — DX: Hematuria, unspecified: R31.9

## 2019-02-25 ENCOUNTER — Encounter (HOSPITAL_BASED_OUTPATIENT_CLINIC_OR_DEPARTMENT_OTHER): Payer: Self-pay

## 2019-02-25 ENCOUNTER — Emergency Department (HOSPITAL_BASED_OUTPATIENT_CLINIC_OR_DEPARTMENT_OTHER)
Admission: EM | Admit: 2019-02-25 | Discharge: 2019-02-25 | Disposition: A | Discharge status: Home / Self Care | Payer: Medicare Other | Attending: Emergency Medicine | Admitting: Emergency Medicine

## 2019-02-25 ENCOUNTER — Ambulatory Visit: Payer: Medicare Other | Admitting: Cardiology

## 2019-02-25 ENCOUNTER — Other Ambulatory Visit: Payer: Self-pay

## 2019-02-25 ENCOUNTER — Emergency Department (HOSPITAL_BASED_OUTPATIENT_CLINIC_OR_DEPARTMENT_OTHER): Payer: Medicare Other

## 2019-02-25 DIAGNOSIS — M549 Dorsalgia, unspecified: Secondary | ICD-10-CM | POA: Diagnosis not present

## 2019-02-25 DIAGNOSIS — Z7984 Long term (current) use of oral hypoglycemic drugs: Secondary | ICD-10-CM | POA: Insufficient documentation

## 2019-02-25 DIAGNOSIS — Z8546 Personal history of malignant neoplasm of prostate: Secondary | ICD-10-CM | POA: Insufficient documentation

## 2019-02-25 DIAGNOSIS — E1122 Type 2 diabetes mellitus with diabetic chronic kidney disease: Secondary | ICD-10-CM | POA: Diagnosis not present

## 2019-02-25 DIAGNOSIS — N183 Chronic kidney disease, stage 3 (moderate): Secondary | ICD-10-CM | POA: Insufficient documentation

## 2019-02-25 DIAGNOSIS — I2510 Atherosclerotic heart disease of native coronary artery without angina pectoris: Secondary | ICD-10-CM | POA: Insufficient documentation

## 2019-02-25 DIAGNOSIS — Z79899 Other long term (current) drug therapy: Secondary | ICD-10-CM | POA: Insufficient documentation

## 2019-02-25 DIAGNOSIS — R319 Hematuria, unspecified: Secondary | ICD-10-CM | POA: Diagnosis not present

## 2019-02-25 DIAGNOSIS — Z7901 Long term (current) use of anticoagulants: Secondary | ICD-10-CM | POA: Insufficient documentation

## 2019-02-25 DIAGNOSIS — R310 Gross hematuria: Secondary | ICD-10-CM | POA: Diagnosis not present

## 2019-02-25 DIAGNOSIS — I5032 Chronic diastolic (congestive) heart failure: Secondary | ICD-10-CM | POA: Diagnosis not present

## 2019-02-25 DIAGNOSIS — R109 Unspecified abdominal pain: Secondary | ICD-10-CM | POA: Diagnosis not present

## 2019-02-25 DIAGNOSIS — I130 Hypertensive heart and chronic kidney disease with heart failure and stage 1 through stage 4 chronic kidney disease, or unspecified chronic kidney disease: Secondary | ICD-10-CM | POA: Diagnosis not present

## 2019-02-25 LAB — URINALYSIS, MICROSCOPIC (REFLEX): RBC / HPF: >50 RBC/hpf (ref 0–5)

## 2019-02-25 LAB — BASIC METABOLIC PANEL
Anion gap: 9 (ref 5–15)
BUN: 30 mg/dL — ABNORMAL HIGH (ref 8–23)
CO2: 22 mmol/L (ref 22–32)
Calcium: 9.2 mg/dL (ref 8.9–10.3)
Chloride: 106 mmol/L (ref 98–111)
Creatinine, Ser: 1.55 mg/dL — ABNORMAL HIGH (ref 0.61–1.24)
GFR calc Af Amer: 51 mL/min — ABNORMAL LOW (ref >60)
GFR calc non Af Amer: 44 mL/min — ABNORMAL LOW (ref >60)
Glucose, Bld: 198 mg/dL — ABNORMAL HIGH (ref 70–99)
Potassium: 4.2 mmol/L (ref 3.5–5.1)
Sodium: 137 mmol/L (ref 135–145)

## 2019-02-25 LAB — URINALYSIS, ROUTINE W REFLEX MICROSCOPIC
Bilirubin Urine: NEGATIVE
Glucose, UA: NEGATIVE mg/dL
Ketones, ur: NEGATIVE mg/dL
Nitrite: NEGATIVE
Protein, ur: 100 mg/dL — AB
Specific Gravity, Urine: 1.025 (ref 1.005–1.030)
pH: 6 (ref 5.0–8.0)

## 2019-02-25 LAB — CBC WITH DIFFERENTIAL/PLATELET
Abs Immature Granulocytes: 0.02 10*3/uL (ref 0.00–0.07)
Basophils Absolute: 0.1 10*3/uL (ref 0.0–0.1)
Basophils Relative: 1 %
Eosinophils Absolute: 0.1 10*3/uL (ref 0.0–0.5)
Eosinophils Relative: 1 %
HCT: 45.1 % (ref 39.0–52.0)
Hemoglobin: 14.6 g/dL (ref 13.0–17.0)
Immature Granulocytes: 0 %
Lymphocytes Relative: 19 %
Lymphs Abs: 1.6 10*3/uL (ref 0.7–4.0)
MCH: 28.6 pg (ref 26.0–34.0)
MCHC: 32.4 g/dL (ref 30.0–36.0)
MCV: 88.3 fL (ref 80.0–100.0)
Monocytes Absolute: 0.9 10*3/uL (ref 0.1–1.0)
Monocytes Relative: 10 %
Neutro Abs: 5.9 10*3/uL (ref 1.7–7.7)
Neutrophils Relative %: 69 %
Platelets: 196 10*3/uL (ref 150–400)
RBC: 5.11 MIL/uL (ref 4.22–5.81)
RDW: 14.6 % (ref 11.5–15.5)
WBC: 8.6 10*3/uL (ref 4.0–10.5)
nRBC: 0 % (ref 0.0–0.2)

## 2019-02-25 MED ORDER — IOHEXOL 300 MG/ML  SOLN
80.0000 mL | Freq: Once | INTRAMUSCULAR | Status: AC | PRN
  Administered 2019-02-25: 80 mL via INTRAVENOUS

## 2019-02-25 MED ORDER — SODIUM CHLORIDE 0.9 % IV BOLUS
500.0000 mL | Freq: Once | INTRAVENOUS | Status: AC
  Administered 2019-02-25: 500 mL via INTRAVENOUS

## 2019-02-25 MED ORDER — CEPHALEXIN 250 MG PO CAPS
500.0000 mg | ORAL_CAPSULE | Freq: Once | ORAL | Status: AC
  Administered 2019-02-25: 500 mg via ORAL
  Filled 2019-02-25: qty 2

## 2019-02-25 MED ORDER — CEPHALEXIN 500 MG PO CAPS: 500.0000 mg | ORAL_CAPSULE | Freq: Four times a day (QID) | ORAL | 0 refills | Status: AC

## 2019-02-25 NOTE — Discharge Instructions (Addendum)
Please take Keflex until completed.  Please follow-up with Dr. Karsten Ro for further evaluation and treatment of your pain and blood in the urine.  This needs to be rechecked and make sure that it clears.  Please return the emergency department develop any new or worsening symptoms including worsening pain, fevers, severe abdominal pain, intractable vomiting, or any other new or concerning symptoms.

## 2019-02-25 NOTE — ED Triage Notes (Signed)
C/o flank pain x months-blood in urine x 3-4 days-NAD-steady gait

## 2019-02-25 NOTE — ED Notes (Signed)
Patient transported to CT 

## 2019-02-25 NOTE — ED Provider Notes (Signed)
[facility] Provider Note   CSN: [id number] Arrival date & time: 02/25/19  2131    History   Chief Complaint Chief Complaint  Patient presents with   Flank Pain    HPI Adam Harris is a 73 y.o. male with history of atrial fibrillation anticoagulated on Eliquis who presents with a several month history of intermittent right flank pain and 3-day history of gross hematuria.  He reports his pain was worse prior to arrival, however he took Advil which has now resolved his pain.  He denies any fevers, chest pain, shortness of breath, abdominal pain, nausea, vomiting, urinary symptoms, other than hematuria.  He has no history of kidney stones.  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     HPI  Past Medical History:  Diagnosis Date   Abnormal radiologic findings on diagnostic imaging of renal pelvis, ureter, or bladder    bilateral ureter abnormalities   Anticoagulant long-term use    eliquis   Arthritis    CAD (coronary artery disease) cardiologist-- dr hochrein   NSTEMI 02-04-2014  per cardiac cath chronic occluded RCA w/ faint left-to-right collaterals and aneurysmal LCFx with sluggish coronary flow/   NSTEMI --11-21-2016 per cardiac cath occluded proximal RCA & mid to diastal CFX 100%, med rx. If that does not work, PTCA or CABG   CKD (chronic kidney disease), stage III ([place])    patient unaware   DOE (dyspnea on exertion)    Hematuria    History of non-ST elevation myocardial infarction (NSTEMI)    02-04-2014  and 11-21-2016  cardiac cath done both times ,  medically management   History of shingles 12/2017   slight pain and numbness still noted in the area   Hypertension    Myocardial infarction [facility]) 2015   Persistent atrial fibrillation    cardiologsit-- dr hochrein   Type 2 diabetes mellitus ([place])     Patient Active Problem List   Diagnosis Date Noted   Shingles 01/10/2018   Chronic  diastolic CHF (congestive heart failure), NYHA class 2 ([place]) 04/02/2017   Atrial fibrillation with RVR ([place]) 11/20/2016   Degenerative arthritis of knee, bilateral 05/31/2016   Low back pain 05/31/2016   Routine general medical examination at a health care facility 12/25/2014   Exertional angina ([place]) 10/29/2014   Atrial fibrillation, new onset ([place]) 10/29/2014   Chronic renal disease, stage III ([place]) 10/29/2014   History of coronary artery stent placement 07/24/2014   Coronary artery disease 03/09/2014   Diabetes mellitus, type 2 ([place]) 02/17/2014   Dyslipidemia 02/05/2014   NSTEMI (non-ST elevated myocardial infarction) ([place]) 02/04/2014   History of non-ST elevation myocardial infarction (NSTEMI) 02/03/2014   Essential hypertension 02/03/2014   Chest pain 02/03/2014   Abnormal prostate specific antigen 04/19/2013   Anxiety state 04/19/2013   Increased frequency of urination 04/19/2013   Malignant neoplasm of prostate ([place]) 04/19/2013   Insomnia 04/19/2013   Stiffness of joint, lower leg 04/19/2013   Sciatica 04/19/2013   Tension type headache 04/19/2013    Past Surgical History:  Procedure Laterality Date   CARDIAC CATHETERIZATION N/A 11/21/2016   Procedure: Left Heart Cath and Coronary Angiography;  Surgeon: Troy Sine, MD;  Location: [facility];  Service: Cardiovascular;  Laterality: N/A;  pRCA 100% , ostial LAD 45%, OM3 80%, mCFX to dCFX 100% (AV groove), lateral OM3 50%   COLONOSCOPY     CYSTOSCOPY/RETROGRADE/URETEROSCOPY Bilateral 03/18/2018   Procedure: CYSTOSCOPY/RETROGRADE/URETEROSCOPY.;  Surgeon: [name], MD;  Location: [facility];  Service: Urology;  Laterality: Bilateral;   KNEE ARTHROSCOPY     LEFT HEART CATHETERIZATION WITH CORONARY ANGIOGRAM N/A 02/04/2014   Procedure: LEFT HEART CATHETERIZATION WITH CORONARY ANGIOGRAM;  Surgeon: [name], MD;  Location: [facility];  Service: Cardiovascular;   Laterality: N/A;  severe one-vessel CAD, chronically occluded RCA with faint left-to-right collaterals;  aneurysmal LCFx with sluggish coronary flow;  normal LVSF w/ moderately elevated LVEDP (ostialOM2 20%, pOM3 20%, pD1 20%, mCFX 50%, diffuse 20% pCFX)   TRANSTHORACIC ECHOCARDIOGRAM  11-22-2016   dr hochrein   ef 55-60%/ mild MR and TR/ moderate LAE        Home Medications    Prior to Admission medications   Medication Sig Start Date End Date Taking? Authorizing Provider  atorvastatin (LIPITOR) 80 MG tablet TAKE 1 TABLET (80 MG TOTAL) BY MOUTH EVERY EVENING. 01/14/19   Lendon Colonel, NP  cephALEXin (KEFLEX) 500 MG capsule Take 1 capsule (500 mg total) by mouth 4 (four) times daily for 10 days. 02/25/19 03/07/19  Svara Twyman, Bea Graff, PA-C  diphenhydramine-acetaminophen (TYLENOL PM) 25-500 MG TABS tablet Take 1 tablet by mouth at bedtime as needed.    [provider]  ELIQUIS 5 MG TABS tablet TAKE 1 TABLET (5 MG TOTAL) BY MOUTH TWICE A DAY 02/10/19   Minus Breeding, MD  furosemide (LASIX) 20 MG tablet Take 1 tablet (20 mg total) by mouth daily. May take extra tab if needed for swelling 08/23/18 11/21/18  Minus Breeding, MD  isosorbide mononitrate (IMDUR) 30 MG 24 hr tablet Take 1 tablet (30 mg total) by mouth daily. 07/22/18   Minus Breeding, MD  metFORMIN (GLUCOPHAGE) 500 MG tablet Take 1 tablet (500 mg total) by mouth 2 (two) times daily with a meal. 08/28/18   Cirigliano, Garvin Fila, DO  metoprolol tartrate (LOPRESSOR) 50 MG tablet Take 1 tablet (50 mg total) by mouth 2 (two) times daily. 02/12/18   Minus Breeding, MD  nitroGLYCERIN (NITROSTAT) 0.4 MG SL tablet Place 1 tablet under the tongue every 5 minutes times 3 doses as needed for chest pains, call 911 if 2nd dose does not help 11/22/16   Barrett, Evelene Croon, PA-C  Olmesartan-amLODIPine-HCTZ (TRIBENZOR) 40-10-12.5 MG TABS Take 0.5 tablets by mouth daily. 03/06/18   Almyra Deforest, PA    Family History Family History  Problem Relation Age  of Onset   Hypertension Mother     Social History Social History   Tobacco Use   Smoking status: Never Smoker   Smokeless tobacco: Never Used  Substance Use Topics   Alcohol use: No   Drug use: No     Allergies   Xarelto [rivaroxaban]   Review of Systems Review of Systems  Constitutional: Negative for chills and fever.  HENT: Negative for facial swelling and sore throat.   Respiratory: Negative for shortness of breath.   Cardiovascular: Negative for chest pain.  Gastrointestinal: Negative for abdominal pain, nausea and vomiting.  Genitourinary: Positive for flank pain and hematuria. Negative for dysuria.  Musculoskeletal: Positive for back pain.  Skin: Negative for rash and wound.  Neurological: Negative for headaches.  Psychiatric/Behavioral: The patient is not nervous/anxious.      Physical Exam Updated Vital Signs BP (!) 154/103 (BP Location: Left Arm)    Pulse 78    Temp 97.9 F (36.6 C) (Oral)    Resp 18    Ht 5\' 11"  (1.803 m)    Wt 98  kg    SpO2 98%    BMI 30.13 kg/m   Physical Exam Vitals signs and nursing note reviewed.  Constitutional:      General: He is not in acute distress.    Appearance: He is well-developed. He is not diaphoretic.  HENT:     Head: Normocephalic and atraumatic.     Mouth/Throat:     Pharynx: No oropharyngeal exudate.  Eyes:     General: No scleral icterus.       Right eye: No discharge.        Left eye: No discharge.     Conjunctiva/sclera: Conjunctivae normal.     Pupils: Pupils are equal, round, and reactive to light.  Neck:     Musculoskeletal: Normal range of motion and neck supple.     Thyroid: No thyromegaly.  Cardiovascular:     Rate and Rhythm: Normal rate and regular rhythm.     Heart sounds: Normal heart sounds. No murmur. No friction rub. No gallop.   Pulmonary:     Effort: Pulmonary effort is normal. No respiratory distress.     Breath sounds: Normal breath sounds. No stridor. No wheezing or rales.    Abdominal:     General: Bowel sounds are normal. There is no distension.     Palpations: Abdomen is soft.     Tenderness: There is no abdominal tenderness. There is no right CVA tenderness, left CVA tenderness, guarding or rebound.  Lymphadenopathy:     Cervical: No cervical adenopathy.  Skin:    General: Skin is warm and dry.     Coloration: Skin is not pale.     Findings: No rash.  Neurological:     Mental Status: He is alert.     Coordination: Coordination normal.      ED Treatments / Results  Labs (all labs ordered are listed, but only abnormal results are displayed) Labs Reviewed  URINALYSIS, ROUTINE W REFLEX MICROSCOPIC - Abnormal; Notable for the following components:      Result Value   Color, Urine BROWN (*)    APPearance TURBID (*)    Hgb urine dipstick LARGE (*)    Protein, ur 100 (*)    Leukocytes,Ua TRACE (*)    All other components within normal limits  BASIC METABOLIC PANEL - Abnormal; Notable for the following components:   Glucose, Bld 198 (*)    BUN 30 (*)    Creatinine, Ser 1.55 (*)    GFR calc non Af Amer 44 (*)    GFR calc Af Amer 51 (*)    All other components within normal limits  URINALYSIS, MICROSCOPIC (REFLEX) - Abnormal; Notable for the following components:   Bacteria, UA FEW (*)    All other components within normal limits  URINE CULTURE  CBC WITH DIFFERENTIAL/PLATELET    EKG None  Radiology Ct Abdomen Pelvis W Contrast  Result Date: 02/25/2019 CLINICAL DATA:  Right flank pain for several months with hematuria EXAM: CT ABDOMEN AND PELVIS WITH CONTRAST TECHNIQUE: Multidetector CT imaging of the abdomen and pelvis was performed using the standard protocol following bolus administration of intravenous contrast. CONTRAST:  32mL OMNIPAQUE 300 COMPARISON:  02/04/2018 FINDINGS: Lower chest: Changes of prior granulomatous disease are noted. No focal infiltrate or effusion is noted. Hepatobiliary: Gallbladder is within normal limits. The liver is  fatty infiltrated. No focal mass is seen. Pancreas: Unremarkable. No pancreatic ductal dilatation or surrounding inflammatory changes. Spleen: Normal in size without focal abnormality. Adrenals/Urinary Tract: Adrenal glands are  within normal limits. Kidneys are well visualized bilaterally. Normal enhancement pattern is seen. Delayed images demonstrate excretion bilaterally. There is again noted some thickening of the walls of the collecting system and right ureter with mild hydronephrosis and hydroureter similar to that seen on the prior exam. This may be related to underlying urinary tract infection. Bladder is well distended. Stomach/Bowel: The appendix is within normal limits. No obstructive or inflammatory changes of large or small bowel are noted. Stomach is within normal limits. Vascular/Lymphatic: Aortic atherosclerosis. No enlarged abdominal or pelvic lymph nodes. Reproductive: Prostate is unremarkable. Other: No abdominal wall hernia or abnormality. No abdominopelvic ascites. Musculoskeletal: Degenerative changes of lumbar spine are noted. IMPRESSION: Wall thickening involving the collecting system and ureter on the right with mild hydronephrosis and hydroureter. The overall appearance is similar to that seen on the prior exam from 2019. This may again represent some underlying urinary tract infection. No calculi are identified. Fatty liver. No other focal abnormality is seen. Electronically Signed   By: Inez Catalina M.D.   On: 02/25/2019 23:32    Procedures Procedures (including critical care time)  Medications Ordered in ED Medications  cephALEXin (KEFLEX) capsule 500 mg (has no administration in time range)  iohexol (OMNIPAQUE) 300 MG/ML solution 80 mL (80 mLs Intravenous Contrast Given 02/25/19 2236)  sodium chloride 0.9 % bolus 500 mL (500 mLs Intravenous New Bag/Given 02/25/19 2251)     Initial Impression / Assessment and Plan / ED Course  I have reviewed the triage vital signs and the  nursing notes.  Pertinent labs & imaging results that were available during my care of the patient were reviewed by me and considered in my medical decision making (see chart for details).        Patient with 1 month intermittent right flank pain and a 3 to 4-day history of gross hematuria.  CBC unremarkable.  BMP shows mild AKI with BUN 30 and creatinine 1.55.  Patient given 500 mL IV fluid bolus in the ED.  UA shows large hematuria, trace leukocytes, greater than 50 RBCs, 6-10 WBCs, few bacteria.  CT abdomen pelvis shows Wall thickening involving the collecting system and ureter on the right with mild hydronephrosis and hydroureter. The overall appearance is similar to that seen on the prior exam from 2019. This may again represent some underlying urinary tract infection. No calculi are identified.  We will treat with Keflex with pyelonephritis coverage.  Urine culture sent.  Patient will be referred to urology for further work-up.  Return precautions discussed.  Patient understands and agrees with plan.  Patient vital stable throughout ED course and discharged in satisfactory condition.  Patient also evaluated by my attending, Dr. Ellender Hose, who guided the patient's management and agrees with plan.  Final Clinical Impressions(s) / ED Diagnoses   Final diagnoses:  Gross hematuria    ED Discharge Orders         Ordered    cephALEXin (KEFLEX) 500 MG capsule  4 times daily     02/25/19 2344           Frederica Kuster, PA-C 02/25/19 2347    Duffy Bruce, MD 02/26/19 1501

## 2019-02-25 NOTE — ED Notes (Signed)
ED Provider at bedside. 

## 2019-02-27 ENCOUNTER — Ambulatory Visit: Payer: Medicare Other | Admitting: Family Medicine

## 2019-02-27 DIAGNOSIS — N130 Hydronephrosis with ureteropelvic junction obstruction: Secondary | ICD-10-CM | POA: Diagnosis not present

## 2019-02-27 DIAGNOSIS — R3121 Asymptomatic microscopic hematuria: Secondary | ICD-10-CM | POA: Diagnosis not present

## 2019-02-27 DIAGNOSIS — R310 Gross hematuria: Secondary | ICD-10-CM | POA: Diagnosis not present

## 2019-02-27 DIAGNOSIS — R9341 Abnormal radiologic findings on diagnostic imaging of renal pelvis, ureter, or bladder: Secondary | ICD-10-CM | POA: Diagnosis not present

## 2019-02-27 DIAGNOSIS — C61 Malignant neoplasm of prostate: Secondary | ICD-10-CM | POA: Diagnosis not present

## 2019-02-27 LAB — URINE CULTURE: Culture: NO GROWTH

## 2019-02-28 ENCOUNTER — Telehealth: Payer: Self-pay | Admitting: Cardiology

## 2019-02-28 NOTE — Telephone Encounter (Signed)
lmtcb - pt needs evisit now or fu in august.  6 month recall.

## 2019-03-03 ENCOUNTER — Other Ambulatory Visit: Payer: Self-pay | Admitting: Cardiology

## 2019-03-03 NOTE — Telephone Encounter (Signed)
imdur 30 mg refilled.

## 2019-03-05 ENCOUNTER — Other Ambulatory Visit: Payer: Self-pay | Admitting: Physician Assistant

## 2019-03-06 NOTE — Telephone Encounter (Signed)
Olmesartan/amlodipine/HCT refilled.

## 2019-03-10 ENCOUNTER — Telehealth: Payer: Self-pay | Admitting: *Deleted

## 2019-03-10 NOTE — Telephone Encounter (Signed)
03/10/19 LMOM @ 7703 am,re: appointment.

## 2019-04-21 ENCOUNTER — Telehealth: Payer: Self-pay | Admitting: Cardiology

## 2019-04-21 NOTE — Telephone Encounter (Signed)
LMTCB to r/s appt with Dr. Percival Spanish on 6/19, change in provider's schedule, when patient calls back please reschedule patient to another day.

## 2019-05-02 ENCOUNTER — Ambulatory Visit: Payer: Medicare Other | Admitting: Cardiology

## 2019-05-12 ENCOUNTER — Telehealth: Payer: Self-pay | Admitting: Cardiology

## 2019-05-12 NOTE — Progress Notes (Signed)
Cardiology Office Note   Date:  05/13/2019   ID:  Shayaan, Parke Oct 22, 1946, MRN [id number]  PCP:  Ronnald Nian, DO  Cardiologist:   Minus Breeding, MD    No chief complaint on file.     History of Present Illness: Adam Harris is a 73 y.o. male who presents for atrial fib and CAD and atrial fibrillation.  Most recent cardiac cath  01/2014 revealed severe 1 vessel disease with CTO RCA with faint left to right collaterals, aneurysmal LCFx with sluggish coronary flow; normal LVSF w/ moderately elevated LVEDP (ostialOM2 20%, pOM3 20%, pD1 20%, mCFX 50%, diffuse 20% pCFX). Chronic diastolic CHF and HL.   At the last visit in January he was having chest pain.  It was revealed that he was not taking his meds consistently.     Since he was last seen he is done quite well.  He frequently walks up 2-3 flights of stairs at his mother's condo.  He does not have any chest pressure with this.  He denies any shortness of breath, PND or orthopnea.  He said no palpitations, presyncope or syncope.  Said no weight gain or edema.  Past Medical History:  Diagnosis Date  . Abnormal radiologic findings on diagnostic imaging of renal pelvis, ureter, or bladder    bilateral ureter abnormalities  . Anticoagulant long-term use    eliquis  . Arthritis   . CAD (coronary artery disease) cardiologist-- dr Sharonda Llamas   NSTEMI 02-04-2014  per cardiac cath chronic occluded RCA w/ faint left-to-right collaterals and aneurysmal LCFx with sluggish coronary flow/   NSTEMI --11-21-2016 per cardiac cath occluded proximal RCA & mid to diastal CFX 100%, med rx. If that does not work, PTCA or CABG  . CKD (chronic kidney disease), stage III [facility])    patient unaware  . DOE (dyspnea on exertion)   . Hematuria   . History of non-ST elevation myocardial infarction (NSTEMI)    02-04-2014  and 11-21-2016  cardiac cath done both times ,  medically management  . History of shingles 12/2017   slight pain and numbness still noted  in the area  . Hypertension   . Myocardial infarction ([place]) 2015  . Persistent atrial fibrillation    cardiologsit-- dr Percival Spanish  . Type 2 diabetes mellitus ([place])     Past Surgical History:  Procedure Laterality Date  . CARDIAC CATHETERIZATION N/A 11/21/2016   Procedure: Left Heart Cath and Coronary Angiography;  Surgeon: Troy Sine, MD;  Location: [place] [facility];  Service: Cardiovascular;  Laterality: N/A;  pRCA 100% , ostial LAD 45%, OM3 80%, mCFX to dCFX 100% (AV groove), lateral OM3 50%  . COLONOSCOPY    . CYSTOSCOPY/RETROGRADE/URETEROSCOPY Bilateral 03/18/2018   Procedure: CYSTOSCOPY/RETROGRADE/URETEROSCOPY.;  Surgeon: [name], MD;  Location: [facility];  Service: Urology;  Laterality: Bilateral;  . KNEE ARTHROSCOPY    . LEFT HEART CATHETERIZATION WITH CORONARY ANGIOGRAM N/A 02/04/2014   Procedure: LEFT HEART CATHETERIZATION WITH CORONARY ANGIOGRAM;  Surgeon: [name], MD;  Location: [place] [facility];  Service: Cardiovascular;  Laterality: N/A;  severe one-vessel CAD, chronically occluded RCA with faint left-to-right collaterals;  aneurysmal LCFx with sluggish coronary flow;  normal LVSF w/ moderately elevated LVEDP (ostialOM2 20%, pOM3 20%, pD1 20%, mCFX 50%, diffuse 20% pCFX)  . TRANSTHORACIC ECHOCARDIOGRAM  11-22-2016   dr Larhonda Dettloff   ef 55-60%/ mild MR and TR/ moderate LAE     Current Outpatient Medications  Medication Sig Dispense Refill  .  atorvastatin (LIPITOR) 80 MG tablet TAKE 1 TABLET (80 MG TOTAL) BY MOUTH EVERY EVENING. 30 tablet 10  . diphenhydramine-acetaminophen (TYLENOL PM) 25-500 MG TABS tablet Take 1 tablet by mouth at bedtime as needed.    Marland Kitchen ELIQUIS 5 MG TABS tablet TAKE 1 TABLET (5 MG TOTAL) BY MOUTH TWICE A DAY 180 tablet 1  . furosemide (LASIX) 20 MG tablet Take 1 tablet (20 mg total) by mouth daily. May take extra tab if needed for swelling 30 tablet 3  . isosorbide mononitrate (IMDUR) 30 MG 24 hr tablet TAKE ONE TABLET BY MOUTH  DAILY 90 tablet 1  . metFORMIN (GLUCOPHAGE) 500 MG tablet Take 1 tablet (500 mg total) by mouth 2 (two) times daily with a meal. 180 tablet 3  . nitroGLYCERIN (NITROSTAT) 0.4 MG SL tablet Place 1 tablet under the tongue every 5 minutes times 3 doses as needed for chest pains, call 911 if 2nd dose does not help 25 tablet 6  . Olmesartan-amLODIPine-HCTZ 40-10-12.5 MG TABS TAKE 0.5(HALF) TABLETS BY MOUTH DAILY. 30 tablet 5  . metoprolol tartrate (LOPRESSOR) 50 MG tablet Take 1 tablet (50 mg total) by mouth 2 (two) times daily. 180 tablet 3  . sildenafil (VIAGRA) 50 MG tablet Take 1 tablet (50 mg total) by mouth daily as needed for erectile dysfunction. 10 tablet 3   No current facility-administered medications for this visit.     Allergies:   Xarelto [rivaroxaban]    ROS:  Please see the history of present illness and ED.   Otherwise, review of systems are negative for all other.   All other systems are reviewed and negative.    PHYSICAL EXAM: VS:  BP 123/78   Pulse 81   Temp (!) 97.3 F (36.3 C)   Ht 5\' 11"  (1.803 m)   Wt 214 lb 6.4 oz (97.3 kg)   BMI 29.90 kg/m  , BMI Body mass index is 29.9 kg/m.  GENERAL:  Well appearing NECK:  No jugular venous distention, waveform within normal limits, carotid upstroke brisk and symmetric, no bruits, no thyromegaly LUNGS:  Clear to auscultation bilaterally CHEST:  Unremarkable HEART:  PMI not displaced or sustained,S1 and S2 within normal limits, no S3, no S4, no clicks, no rubs, no murmurs ABD:  Flat, positive bowel sounds normal in frequency in pitch, no bruits, no rebound, no guarding, no midline pulsatile mass, no hepatomegaly, no splenomegaly EXT:  2 plus pulses throughout, no edema, no cyanosis no clubbing    EKG:  EKG is not  ordered today.   Recent Labs: 08/23/2018: ALT 12 02/25/2019: BUN 30; Creatinine, Ser 1.55; Hemoglobin 14.6; Platelets 196; Potassium 4.2; Sodium 137    Lipid Panel    Component Value Date/Time   CHOL 124  11/21/2016 0551   TRIG 97 11/21/2016 0551   HDL 31 (L) 11/21/2016 0551   CHOLHDL 4.0 11/21/2016 0551   VLDL 19 11/21/2016 0551   LDLCALC 74 11/21/2016 0551      Wt Readings from Last 3 Encounters:  05/13/19 214 lb 6.4 oz (97.3 kg)  02/25/19 216 lb 0.8 oz (98 kg)  11/26/18 208 lb 9.6 oz (94.6 kg)      Other studies Reviewed: Additional studies/ records that were reviewed today include: None. Review of the above records demonstrates:      ASSESSMENT AND PLAN:  ATRIAL FIB:  Mr. Koehn Salehi has a CHA2DS2 - VASc score of 3.   Continue current therapy.   CAD:   Occlusive disease managed medically.  No change in therapy.   HTN:  The blood pressure is at target.  No change in therapy.   CHRONIC DIASTOLIC HF: He seems to be euvolemic.  Continue meds as listed.  DYSLIPIDEMIA: I will check a lipid profile today.  Goal will be an LDL less than 70.  ED: He really wants therapy for this and I will give him a trial of Viagra 50 mg as needed but he understands he cannot take his Imdur when he is taking his medication and we reviewed these instructions.    Current medicines are reviewed at length with the patient today.  The patient does not have concerns regarding medicines.  The following changes have been made:  As abve  Labs/ tests ordered today include:    Orders Placed This Encounter  Procedures  . Lipid panel     Disposition:   FU with me in one year.    Signed, Minus Breeding, MD  05/13/2019 1:02 PM    [place]

## 2019-05-12 NOTE — Telephone Encounter (Signed)
LVM , reminding pt of his appt on 05-13-19 with Dr Percival Spanish.

## 2019-05-13 ENCOUNTER — Other Ambulatory Visit: Payer: Self-pay

## 2019-05-13 ENCOUNTER — Ambulatory Visit (INDEPENDENT_AMBULATORY_CARE_PROVIDER_SITE_OTHER): Payer: Medicare Other | Admitting: Cardiology

## 2019-05-13 ENCOUNTER — Encounter: Payer: Self-pay | Admitting: Cardiology

## 2019-05-13 VITALS — BP 123/78 | HR 81 | Temp 97.3°F | Ht 71.0 in | Wt 214.4 lb

## 2019-05-13 DIAGNOSIS — I4891 Unspecified atrial fibrillation: Secondary | ICD-10-CM

## 2019-05-13 DIAGNOSIS — E785 Hyperlipidemia, unspecified: Secondary | ICD-10-CM | POA: Diagnosis not present

## 2019-05-13 DIAGNOSIS — I5032 Chronic diastolic (congestive) heart failure: Secondary | ICD-10-CM | POA: Diagnosis not present

## 2019-05-13 DIAGNOSIS — I2510 Atherosclerotic heart disease of native coronary artery without angina pectoris: Secondary | ICD-10-CM | POA: Diagnosis not present

## 2019-05-13 DIAGNOSIS — N529 Male erectile dysfunction, unspecified: Secondary | ICD-10-CM

## 2019-05-13 MED ORDER — SILDENAFIL CITRATE 50 MG PO TABS: 50.0000 mg | ORAL_TABLET | Freq: Every day | ORAL | 3 refills | Status: DC | PRN

## 2019-05-13 MED ORDER — METOPROLOL TARTRATE 50 MG PO TABS: 50.0000 mg | ORAL_TABLET | Freq: Two times a day (BID) | ORAL | 3 refills | Status: DC

## 2019-05-13 NOTE — Patient Instructions (Addendum)
Medication Instructions:  SILDENAFIL 50 MG AS NEEDED, NO ISOSORBIDE DAY BEFORE, DAY OF, OR DAY AFTER   If you need a refill on your cardiac medications before your next appointment, please call your pharmacy.   Lab work: FASTING LP TODAY   If you have labs (blood work) drawn today and your tests are completely normal, you will receive your results only by: Marland Kitchen MyChart Message (if you have MyChart) OR . A paper copy in the mail If you have any lab test that is abnormal or we need to change your treatment, we will call you to review the results.  Testing/Procedures: NONE   Follow-Up: At [facility], you and your health needs are our priority.  As part of our continuing mission to provide you with exceptional heart care, we have created designated Provider Care Teams.  These Care Teams include your primary Cardiologist (physician) and Advanced Practice Providers (APPs -  Physician Assistants and Nurse Practitioners) who all work together to provide you with the care you need, when you need it. You will need a follow up appointment in 12 months.  Please call our office 2 months in advance to schedule this appointment.  You may see Minus Breeding, MD or one of the following Advanced Practice Providers on your designated Care Team:   Rosaria Ferries, PA-C . Jory Sims, DNP, ANP

## 2019-06-25 ENCOUNTER — Telehealth: Payer: Self-pay | Admitting: *Deleted

## 2019-06-25 DIAGNOSIS — E785 Hyperlipidemia, unspecified: Secondary | ICD-10-CM | POA: Diagnosis not present

## 2019-06-25 NOTE — Telephone Encounter (Signed)
Eliquis 5 mg samples 2 boxes Lot [id number] Exp 4/22 Given to patient while he was here with wife

## 2019-06-26 LAB — LIPID PANEL
Chol/HDL Ratio: 3.5 ratio (ref 0.0–5.0)
Cholesterol, Total: 149 mg/dL (ref 100–199)
HDL: 43 mg/dL (ref >39)
LDL Calculated: 88 mg/dL (ref 0–99)
Triglycerides: 91 mg/dL (ref 0–149)
VLDL Cholesterol Cal: 18 mg/dL (ref 5–40)

## 2019-07-07 ENCOUNTER — Other Ambulatory Visit: Payer: Self-pay | Admitting: Cardiology

## 2019-07-08 ENCOUNTER — Telehealth: Payer: Self-pay | Admitting: Cardiology

## 2019-07-08 DIAGNOSIS — E785 Hyperlipidemia, unspecified: Secondary | ICD-10-CM

## 2019-07-08 MED ORDER — ROSUVASTATIN CALCIUM 40 MG PO TABS: 40.0000 mg | ORAL_TABLET | Freq: Every day | ORAL | 3 refills | Status: DC

## 2019-07-08 NOTE — Telephone Encounter (Signed)
Called patient, gave results. Ordered new medication and ordered new lab work in 10 weeks. FYI to primary nurse.

## 2019-07-08 NOTE — Telephone Encounter (Signed)
° ° °  Please return call to patient with lab results 

## 2019-07-29 ENCOUNTER — Telehealth: Payer: Self-pay | Admitting: Cardiology

## 2019-07-29 NOTE — Telephone Encounter (Signed)
Advised wife currently out of Eliquis samples, ok to call back

## 2019-07-29 NOTE — Telephone Encounter (Signed)
° ° °  Patient calling the office for samples of medication: ° ° °1.  What medication and dosage are you requesting samples for? °ELIQUIS 5 MG TABS tablet ° °2.  Are you currently out of this medication? yes ° ° °

## 2019-08-26 ENCOUNTER — Other Ambulatory Visit: Payer: Self-pay | Admitting: Cardiology

## 2019-09-04 ENCOUNTER — Ambulatory Visit (INDEPENDENT_AMBULATORY_CARE_PROVIDER_SITE_OTHER): Payer: Medicare Other

## 2019-09-04 ENCOUNTER — Other Ambulatory Visit: Payer: Self-pay

## 2019-09-04 DIAGNOSIS — Z23 Encounter for immunization: Secondary | ICD-10-CM

## 2019-10-14 DIAGNOSIS — Z8616 Personal history of COVID-19: Secondary | ICD-10-CM

## 2019-10-14 HISTORY — DX: Personal history of COVID-19: Z86.16

## 2019-10-15 ENCOUNTER — Other Ambulatory Visit: Payer: Self-pay | Admitting: Family Medicine

## 2019-10-15 MED ORDER — METFORMIN HCL 500 MG PO TABS: 500.0000 mg | ORAL_TABLET | Freq: Two times a day (BID) | ORAL | 3 refills | Status: DC

## 2019-10-21 ENCOUNTER — Telehealth: Payer: Self-pay | Admitting: Family Medicine

## 2019-10-21 DIAGNOSIS — C61 Malignant neoplasm of prostate: Secondary | ICD-10-CM | POA: Diagnosis not present

## 2019-10-21 DIAGNOSIS — R310 Gross hematuria: Secondary | ICD-10-CM | POA: Diagnosis not present

## 2019-10-21 NOTE — Telephone Encounter (Signed)
Pt stated he tested positive for Covid 19 and is experiencing a slight cough and some chills. He would like to know if Dr. Loletha Grayer could send a rx to his pharmacy for the symptoms. Please advise.  [place], [address] [phone] (Phone) [phone] (Fax)

## 2019-10-22 MED ORDER — BENZONATATE 100 MG PO CAPS: 100.0000 mg | ORAL_CAPSULE | Freq: Three times a day (TID) | ORAL | 1 refills | Status: DC | PRN

## 2019-10-22 NOTE — Telephone Encounter (Signed)
Called pateint made aware meds called into pharmacy.

## 2019-10-22 NOTE — Telephone Encounter (Signed)
Rx sent for tessalon 1 cap TID PRN to pharm as requested. Hope he feels better!

## 2019-10-28 DIAGNOSIS — Z7189 Other specified counseling: Secondary | ICD-10-CM | POA: Insufficient documentation

## 2019-10-28 NOTE — Progress Notes (Deleted)
Cardiology Office Note   Date:  10/28/2019   ID:  Adam Harris, DOB 06-13-1946, MRN [id number]  PCP:  Ronnald Nian, DO  Cardiologist:   Minus Breeding, MD    No chief complaint on file.     History of Present Illness: Adam Harris is a 73 y.o. male who presents for atrial fib and CAD and atrial fibrillation.  Most recent cardiac cath  01/2014 revealed severe 1 vessel disease with CTO RCA with faint left to right collaterals, aneurysmal LCFx with sluggish coronary flow; normal LVSF w/ moderately elevated LVEDP (ostialOM2 20%, pOM3 20%, pD1 20%, mCFX 50%, diffuse 20% pCFX). Chronic diastolic CHF and HL.     Since I last saw him he has done ***    ***At the last visit in January he was having chest pain.  It was revealed that he was not taking his meds consistently.     Since he was last seen he is done quite well.  He frequently walks up 2-3 flights of stairs at his mother's condo.  He does not have any chest pressure with this.  He denies any shortness of breath, PND or orthopnea.  He said no palpitations, presyncope or syncope.  Said no weight gain or edema.  Past Medical History:  Diagnosis Date  . Abnormal radiologic findings on diagnostic imaging of renal pelvis, ureter, or bladder    bilateral ureter abnormalities  . Anticoagulant long-term use    eliquis  . Arthritis   . CAD (coronary artery disease) cardiologist-- dr Kingson Lohmeyer   NSTEMI 02-04-2014  per cardiac cath chronic occluded RCA w/ faint left-to-right collaterals and aneurysmal LCFx with sluggish coronary flow/   NSTEMI --11-21-2016 per cardiac cath occluded proximal RCA & mid to diastal CFX 100%, med rx. If that does not work, PTCA or CABG  . CKD (chronic kidney disease), stage III [facility])    patient unaware  . DOE (dyspnea on exertion)   . Hematuria   . History of non-ST elevation myocardial infarction (NSTEMI)    02-04-2014  and 11-21-2016  cardiac cath done both times ,  medically management  . History of  shingles 12/2017   slight pain and numbness still noted in the area  . Hypertension   . Myocardial infarction ([place]) 2015  . Persistent atrial fibrillation    cardiologsit-- dr Percival Spanish  . Type 2 diabetes mellitus ([place])     Past Surgical History:  Procedure Laterality Date  . CARDIAC CATHETERIZATION N/A 11/21/2016   Procedure: Left Heart Cath and Coronary Angiography;  Surgeon: Troy Sine, MD;  Location: [place] [facility];  Service: Cardiovascular;  Laterality: N/A;  pRCA 100% , ostial LAD 45%, OM3 80%, mCFX to dCFX 100% (AV groove), lateral OM3 50%  . COLONOSCOPY    . CYSTOSCOPY/RETROGRADE/URETEROSCOPY Bilateral 03/18/2018   Procedure: CYSTOSCOPY/RETROGRADE/URETEROSCOPY.;  Surgeon: [name], MD;  Location: [facility];  Service: Urology;  Laterality: Bilateral;  . KNEE ARTHROSCOPY    . LEFT HEART CATHETERIZATION WITH CORONARY ANGIOGRAM N/A 02/04/2014   Procedure: LEFT HEART CATHETERIZATION WITH CORONARY ANGIOGRAM;  Surgeon: [name], MD;  Location: [place] [facility];  Service: Cardiovascular;  Laterality: N/A;  severe one-vessel CAD, chronically occluded RCA with faint left-to-right collaterals;  aneurysmal LCFx with sluggish coronary flow;  normal LVSF w/ moderately elevated LVEDP (ostialOM2 20%, pOM3 20%, pD1 20%, mCFX 50%, diffuse 20% pCFX)  . TRANSTHORACIC ECHOCARDIOGRAM  11-22-2016   dr Giliana Vantil   ef 55-60%/ mild MR and TR/  moderate LAE     Current Outpatient Medications  Medication Sig Dispense Refill  . benzonatate (TESSALON) 100 MG capsule Take 1 capsule (100 mg total) by mouth 3 (three) times daily as needed for cough. 30 capsule 1  . diphenhydramine-acetaminophen (TYLENOL PM) 25-500 MG TABS tablet Take 1 tablet by mouth at bedtime as needed.    Marland Kitchen ELIQUIS 5 MG TABS tablet TAKE 1 TABLET (5 MG TOTAL) BY MOUTH TWICE A DAY 180 tablet 1  . furosemide (LASIX) 20 MG tablet Take 1 tablet (20 mg total) by mouth daily. May take extra tab if needed for swelling 30  tablet 3  . isosorbide mononitrate (IMDUR) 30 MG 24 hr tablet TAKE ONE TABLET BY MOUTH DAILY 90 tablet 2  . metFORMIN (GLUCOPHAGE) 500 MG tablet Take 1 tablet (500 mg total) by mouth 2 (two) times daily with a meal. 180 tablet 3  . metoprolol tartrate (LOPRESSOR) 50 MG tablet Take 1 tablet (50 mg total) by mouth 2 (two) times daily. 180 tablet 3  . nitroGLYCERIN (NITROSTAT) 0.4 MG SL tablet PLACE ONE TABLET UNDER THE TONGUE EVERY 5 MINUTES TIMES THREE DOSES AS NEEDED FOR CHEST PAIN. CALL 911 IF 2ND DOSE DOES NOT HELP 25 tablet 5  . Olmesartan-amLODIPine-HCTZ 40-10-12.5 MG TABS TAKE 0.5(HALF) TABLETS BY MOUTH DAILY. 30 tablet 5  . rosuvastatin (CRESTOR) 40 MG tablet Take 1 tablet (40 mg total) by mouth daily. 90 tablet 3  . sildenafil (VIAGRA) 50 MG tablet Take 1 tablet (50 mg total) by mouth daily as needed for erectile dysfunction. 10 tablet 3   No current facility-administered medications for this visit.    Allergies:   Xarelto [rivaroxaban]    ROS:  ***.   Otherwise, review of systems are negative for all other.   All other systems are reviewed and negative.    PHYSICAL EXAM: VS:  There were no vitals taken for this visit. , BMI There is no height or weight on file to calculate BMI.  GENERAL:  Well appearing NECK:  No jugular venous distention, waveform within normal limits, carotid upstroke brisk and symmetric, no bruits, no thyromegaly LUNGS:  Clear to auscultation bilaterally CHEST:  Unremarkable HEART:  PMI not displaced or sustained,S1 and S2 within normal limits, no S3, no clicks, no rubs, *** murmurs, irregular ABD:  Flat, positive bowel sounds normal in frequency in pitch, no bruits, no rebound, no guarding, no midline pulsatile mass, no hepatomegaly, no splenomegaly EXT:  2 plus pulses throughout, no edema, no cyanosis no clubbing    ***GENERAL:  Well appearing NECK:  No jugular venous distention, waveform within normal limits, carotid upstroke brisk and symmetric, no  bruits, no thyromegaly LUNGS:  Clear to auscultation bilaterally CHEST:  Unremarkable HEART:  PMI not displaced or sustained,S1 and S2 within normal limits, no S3, no S4, no clicks, no rubs, no murmurs ABD:  Flat, positive bowel sounds normal in frequency in pitch, no bruits, no rebound, no guarding, no midline pulsatile mass, no hepatomegaly, no splenomegaly EXT:  2 plus pulses throughout, no edema, no cyanosis no clubbing    EKG:  EKG is *** ordered today. ***  Recent Labs: 02/25/2019: BUN 30; Creatinine, Ser 1.55; Hemoglobin 14.6; Platelets 196; Potassium 4.2; Sodium 137    Lipid Panel    Component Value Date/Time   CHOL 149 06/25/2019 1621   TRIG 91 06/25/2019 1621   HDL 43 06/25/2019 1621   CHOLHDL 3.5 06/25/2019 1621   CHOLHDL 4.0 11/21/2016 0551   VLDL 19  11/21/2016 0551   LDLCALC 88 06/25/2019 1621      Wt Readings from Last 3 Encounters:  05/13/19 214 lb 6.4 oz (97.3 kg)  02/25/19 216 lb 0.8 oz (98 kg)  11/26/18 208 lb 9.6 oz (94.6 kg)      Other studies Reviewed: Additional studies/ records that were reviewed today include: ***. Review of the above records demonstrates:   ***   ASSESSMENT AND PLAN:  CHRONIC ATRIAL FIB:  Mr. Owen Pratte has a CHA2DS2 - VASc score of 3.   *** Continue current therapy.   CAD:   *** Occlusive disease managed medically.   No change in therapy.   HTN:  The blood pressure is *** at target.  No change in therapy.   CHRONIC DIASTOLIC HF:  *** He seems to be euvolemic.  Continue meds as listed.  DYSLIPIDEMIA: ***  I will check a lipid profile today.  Goal will be an LDL less than 70.  ED:   *** He really wants therapy for this and I will give him a trial of Viagra 50 mg as needed but he understands he cannot take his Imdur when he is taking his medication and we reviewed these instructions.  COVID EDUCATION:  ***   Current medicines are reviewed at length with the patient today.  The patient does not have concerns regarding  medicines.  The following changes have been made:  As abve  Labs/ tests ordered today include:    No orders of the defined types were placed in this encounter.    Disposition:   FU with me in one year.    Signed, Minus Breeding, MD  10/28/2019 6:35 PM    [place]

## 2019-10-29 ENCOUNTER — Ambulatory Visit: Payer: Medicare Other | Admitting: Cardiology

## 2019-11-21 ENCOUNTER — Other Ambulatory Visit: Payer: Self-pay | Admitting: Cardiology

## 2019-11-24 DIAGNOSIS — R310 Gross hematuria: Secondary | ICD-10-CM | POA: Diagnosis not present

## 2019-11-25 ENCOUNTER — Telehealth: Payer: Self-pay | Admitting: Cardiology

## 2019-11-25 ENCOUNTER — Other Ambulatory Visit: Payer: Self-pay | Admitting: Urology

## 2019-11-25 NOTE — Telephone Encounter (Signed)
   [facility] Pre-operative Risk Assessment    Request for surgical clearance:  What type of surgery is being performed?  Cystoscopy Right Retrograde Pyelogram Right Ureteroscopy Possible Stent Placement   1. When is this surgery scheduled?  Pending Clearance   2. What type of clearance is required (medical clearance vs. Pharmacy clearance to hold med vs. Both)? Pharmacy & Surgical Clearance  3. Are there any medications that need to be held prior to surgery and how long?  Eliquis needs to be held 3 days prior  4. Practice name and name of physician performing surgery?  [facility] Dr. [name]  5. What is your office phone number  [phone]    7.   What is your office fax number  [phone]  8.   Anesthesia type (None, local, MAC, general) ?  General    Trilby Drummer 11/25/2019, 3:19 PM  _________________________________________________________________   (provider comments below)

## 2019-11-25 NOTE — Telephone Encounter (Signed)
Patient with diagnosis of afib on Eliquis for anticoagulation.    Procedure: Cystoscopy Right Retrograde Pyelogram Right Ureteroscopy Possible Stent Placement   Date of procedure: TBD  CHADS2-VASc score of  5 (CHF, HTN, AGE, DM2, CAD)  CrCl 48ml/min  Per office protocol, patient can hold Eliquis for 3 days prior to procedure.

## 2019-11-26 ENCOUNTER — Encounter: Payer: Medicare Other | Admitting: Family Medicine

## 2019-11-26 ENCOUNTER — Other Ambulatory Visit: Payer: Self-pay

## 2019-11-26 NOTE — Telephone Encounter (Signed)
Left message to call back and ask to speak with pre-op team. 

## 2019-11-27 ENCOUNTER — Ambulatory Visit (INDEPENDENT_AMBULATORY_CARE_PROVIDER_SITE_OTHER): Payer: Medicare Other | Admitting: Family Medicine

## 2019-11-27 ENCOUNTER — Encounter: Payer: Self-pay | Admitting: Family Medicine

## 2019-11-27 VITALS — BP 108/76 | HR 81 | Temp 97.4°F | Ht 71.0 in | Wt 213.6 lb

## 2019-11-27 DIAGNOSIS — I4891 Unspecified atrial fibrillation: Secondary | ICD-10-CM

## 2019-11-27 DIAGNOSIS — N1831 Chronic kidney disease, stage 3a: Secondary | ICD-10-CM

## 2019-11-27 DIAGNOSIS — E1122 Type 2 diabetes mellitus with diabetic chronic kidney disease: Secondary | ICD-10-CM

## 2019-11-27 DIAGNOSIS — I10 Essential (primary) hypertension: Secondary | ICD-10-CM | POA: Diagnosis not present

## 2019-11-27 DIAGNOSIS — I5032 Chronic diastolic (congestive) heart failure: Secondary | ICD-10-CM

## 2019-11-27 DIAGNOSIS — E785 Hyperlipidemia, unspecified: Secondary | ICD-10-CM

## 2019-11-27 DIAGNOSIS — E1121 Type 2 diabetes mellitus with diabetic nephropathy: Secondary | ICD-10-CM | POA: Diagnosis not present

## 2019-11-27 DIAGNOSIS — Z0000 Encounter for general adult medical examination without abnormal findings: Secondary | ICD-10-CM | POA: Diagnosis not present

## 2019-11-27 DIAGNOSIS — Z955 Presence of coronary angioplasty implant and graft: Secondary | ICD-10-CM

## 2019-11-27 DIAGNOSIS — Z1211 Encounter for screening for malignant neoplasm of colon: Secondary | ICD-10-CM

## 2019-11-27 NOTE — Telephone Encounter (Addendum)
   Primary Cardiologist: Minus Breeding, MD  Chart reviewed as part of pre-operative protocol coverage. Patient was contacted 11/27/2019 in reference to pre-operative risk assessment for pending surgery as outlined below.  Adam Harris was last seen on 05/13/19 by Dr. Percival Spanish.  Since that day, Adam Harris has done well. He has an exercise tolerance of >4 METS. Reports no chest pain, pressure, tightness. According to the Revised Cardiac Risk Index (RCRI), his perioperative risk of major cardiac events in 6.6%.   Therefore, based on ACC/AHA guidelines, the patient would be at acceptable risk for the planned procedure without further cardiovascular testing.   His case has been reviewed by PharmD - per office protocol patient can hold Eliquis for 3 days prior to procedure.   Adam Harris was contacted via telephone and made aware of these recommendations. Made me aware that he no-showed an early follow up scheduled for December. Requests to reschedule office visits for him and his wife with Dr. Percival Spanish only. These appointments do not need to be completed prior to upcoming procedure, but I will route to [facility] scheduling team for assistance scheduling.   I will route this recommendation to the requesting party via Epic fax function and remove from pre-op pool.  Please call with questions.  Adam Dubonnet, NP 11/27/2019, 11:35 AM  '

## 2019-11-27 NOTE — Progress Notes (Signed)
Adam Harris is a 74 y.o. male  Chief Complaint  Patient presents with  . Annual Exam  . Colonoscopy    due for one    HPI: Adam Harris is a 74 y.o. male here for annual exam and f/u on chronic medical issues.   Pt offers no acute concerns or complaints today. He has a h/o  ureter abnormalities and hematuria, which has recently recurred. He is scheduled for cystoscopy scheduled for 12/15/19 with Dr. Karsten Ro.   Pt also follows with Dr. Alba Cory (cardio) for h/o a-fib, CAD w/ NSTEMI and s/p stent placement, CHF. He missed an appt on 10/29/19 but has not rescheduled.  Today he denies HA, dizziness, vision changes, CP, SOB, n/v/d/c, LE edema, numbness or tingling.  He is UTD on immunizations.   Past Medical History:  Diagnosis Date  . Abnormal radiologic findings on diagnostic imaging of renal pelvis, ureter, or bladder    bilateral ureter abnormalities  . Anticoagulant long-term use    eliquis  . Arthritis   . CAD (coronary artery disease) cardiologist-- dr hochrein   NSTEMI 02-04-2014  per cardiac cath chronic occluded RCA w/ faint left-to-right collaterals and aneurysmal LCFx with sluggish coronary flow/   NSTEMI --11-21-2016 per cardiac cath occluded proximal RCA & mid to diastal CFX 100%, med rx. If that does not work, PTCA or CABG  . CKD (chronic kidney disease), stage III    patient unaware  . DOE (dyspnea on exertion)   . Hematuria   . History of non-ST elevation myocardial infarction (NSTEMI)    02-04-2014  and 11-21-2016  cardiac cath done both times ,  medically management  . History of shingles 12/2017   slight pain and numbness still noted in the area  . Hypertension   . Myocardial infarction ([place]) 2015  . Persistent atrial fibrillation [facility])    cardiologsit-- dr Percival Spanish  . Type 2 diabetes mellitus ([place])     Past Surgical History:  Procedure Laterality Date  . CARDIAC CATHETERIZATION N/A 11/21/2016   Procedure: Left Heart Cath and Coronary Angiography;  Surgeon:  Troy Sine, MD;  Location: [facility];  Service: Cardiovascular;  Laterality: N/A;  pRCA 100% , ostial LAD 45%, OM3 80%, mCFX to dCFX 100% (AV groove), lateral OM3 50%  . COLONOSCOPY    . CYSTOSCOPY/RETROGRADE/URETEROSCOPY Bilateral 03/18/2018   Procedure: CYSTOSCOPY/RETROGRADE/URETEROSCOPY.;  Surgeon: [name], MD;  Location: [facility];  Service: Urology;  Laterality: Bilateral;  . KNEE ARTHROSCOPY    . LEFT HEART CATHETERIZATION WITH CORONARY ANGIOGRAM N/A 02/04/2014   Procedure: LEFT HEART CATHETERIZATION WITH CORONARY ANGIOGRAM;  Surgeon: [name], MD;  Location: [facility];  Service: Cardiovascular;  Laterality: N/A;  severe one-vessel CAD, chronically occluded RCA with faint left-to-right collaterals;  aneurysmal LCFx with sluggish coronary flow;  normal LVSF w/ moderately elevated LVEDP (ostialOM2 20%, pOM3 20%, pD1 20%, mCFX 50%, diffuse 20% pCFX)  . TRANSTHORACIC ECHOCARDIOGRAM  11-22-2016   dr hochrein   ef 55-60%/ mild MR and TR/ moderate LAE    Social History   Socioeconomic History  . Marital status: Married    Spouse name: Not on file  . Number of children: Not on file  . Years of education: Not on file  . Highest education level: Not on file  Occupational History  . Not on file  Tobacco Use  . Smoking status: Never Smoker  . Smokeless tobacco: Never Used  Substance and Sexual Activity  . Alcohol use: No  .  Drug use: No  . Sexual activity: Not on file  Other Topics Concern  . Not on file  Social History Narrative  . Not on file   Social Determinants of Health   Financial Resource Strain:   . Difficulty of Paying Living Expenses: Not on file  Food Insecurity:   . Worried About Charity fundraiser in the Last Year: Not on file  . Ran Out of Food in the Last Year: Not on file  Transportation Needs:   . Lack of Transportation (Medical): Not on file  . Lack of Transportation (Non-Medical): Not on file  Physical Activity:   .  Days of Exercise per Week: Not on file  . Minutes of Exercise per Session: Not on file  Stress:   . Feeling of Stress : Not on file  Social Connections:   . Frequency of Communication with Friends and Family: Not on file  . Frequency of Social Gatherings with Friends and Family: Not on file  . Attends Religious Services: Not on file  . Active Member of Clubs or Organizations: Not on file  . Attends Archivist Meetings: Not on file  . Marital Status: Not on file  Intimate Partner Violence:   . Fear of Current or Ex-Partner: Not on file  . Emotionally Abused: Not on file  . Physically Abused: Not on file  . Sexually Abused: Not on file    Family History  Problem Relation Age of Onset  . Hypertension Mother      Immunization History  Administered Date(s) Administered  . DTaP 12/24/2014  . Fluad Quad(high Dose 65+) 09/04/2019  . Influenza, High Dose Seasonal PF 08/23/2018  . Influenza,inj,Quad PF,6+ Mos 07/27/2014  . Influenza-Unspecified 07/27/2014  . Pneumococcal Conjugate-13 05/22/2016  . Pneumococcal Polysaccharide-23 02/03/2014, 12/24/2014  . Tdap 02/03/2014, 12/24/2014, 07/07/2018    Outpatient Encounter Medications as of 11/27/2019  Medication Sig  . diphenhydramine-acetaminophen (TYLENOL PM) 25-500 MG TABS tablet Take 1 tablet by mouth at bedtime as needed.  Marland Kitchen ELIQUIS 5 MG TABS tablet TAKE 1 TABLET (5 MG TOTAL) BY MOUTH TWICE A DAY  . furosemide (LASIX) 20 MG tablet TAKE ONE TABLET BY MOUTH DAILY . MAY TAKE EXTRA TABLET IF NEEDED FOR SWELLING  . isosorbide mononitrate (IMDUR) 30 MG 24 hr tablet TAKE ONE TABLET BY MOUTH DAILY  . metFORMIN (GLUCOPHAGE) 500 MG tablet Take 1 tablet (500 mg total) by mouth 2 (two) times daily with a meal.  . nitroGLYCERIN (NITROSTAT) 0.4 MG SL tablet PLACE ONE TABLET UNDER THE TONGUE EVERY 5 MINUTES TIMES THREE DOSES AS NEEDED FOR CHEST PAIN. CALL 911 IF 2ND DOSE DOES NOT HELP  . Olmesartan-amLODIPine-HCTZ 40-10-12.5 MG TABS TAKE  0.5(HALF) TABLETS BY MOUTH DAILY.  . rosuvastatin (CRESTOR) 40 MG tablet Take 1 tablet (40 mg total) by mouth daily.  . metoprolol tartrate (LOPRESSOR) 50 MG tablet Take 1 tablet (50 mg total) by mouth 2 (two) times daily.  . [DISCONTINUED] benzonatate (TESSALON) 100 MG capsule Take 1 capsule (100 mg total) by mouth 3 (three) times daily as needed for cough. (Patient not taking: Reported on 11/27/2019)  . [DISCONTINUED] sildenafil (VIAGRA) 50 MG tablet Take 1 tablet (50 mg total) by mouth daily as needed for erectile dysfunction. (Patient not taking: Reported on 11/27/2019)   No facility-administered encounter medications on file as of 11/27/2019.     ROS: Gen: no fever, chills  Skin: no rash, itching ENT: no ear pain, ear drainage, nasal congestion, rhinorrhea, sinus pressure, sore throat  Eyes: no blurry vision, double vision Resp: no cough, wheeze,SOB CV: no CP, palpitations, LE edema,  GI: no heartburn, n/v/d/c, abd pain GU: no dysuria, urgency, frequency, + hematuria; no testicular swelling or masses MSK: no joint pain, myalgias, back pain Neuro: no dizziness, headache, weakness Psych: no depression, anxiety, insomnia   Allergies  Allergen Reactions  . Xarelto [Rivaroxaban] Other (See Comments)    Skin Blisters     BP 108/76   Pulse 81   Temp (!) 97.4 F (36.3 C)   Ht 5\' 11"  (1.803 m)   Wt 213 lb 9.6 oz (96.9 kg)   SpO2 96%   BMI 29.79 kg/m    BP Readings from Last 3 Encounters:  11/27/19 108/76  05/13/19 123/78  02/25/19 (!) 139/92   Pulse Readings from Last 3 Encounters:  11/27/19 81  05/13/19 81  02/25/19 66   Wt Readings from Last 3 Encounters:  11/27/19 213 lb 9.6 oz (96.9 kg)  05/13/19 214 lb 6.4 oz (97.3 kg)  02/25/19 216 lb 0.8 oz (98 kg)    Physical Exam  Constitutional: He is oriented to person, place, and time. He appears well-developed and well-nourished. No distress.  HENT:  Head: Normocephalic and atraumatic.  Right Ear: Tympanic membrane and  ear canal normal.  Left Ear: Tympanic membrane and ear canal normal.  Nose: Nose normal.  Mouth/Throat: Oropharynx is clear and moist and mucous membranes are normal.  Neck: No thyromegaly present.  Cardiovascular: Normal rate and intact distal pulses.  S1 and S2 within normal limits, no S3, no S4, no clicks, no rubs, no murmurs  Pulmonary/Chest: Effort normal and breath sounds normal. He has no wheezes. He has no rhonchi.  Abdominal: Soft. Bowel sounds are normal. He exhibits no distension and no mass. There is no abdominal tenderness. There is no rebound and no guarding.  Musculoskeletal:        General: No edema. Normal range of motion.     Cervical back: Neck supple.  Lymphadenopathy:    He has no cervical adenopathy.  Neurological: He is alert and oriented to person, place, and time. He exhibits normal muscle tone. Coordination normal.  Skin: Skin is warm and dry.  Psychiatric: He has a normal mood and affect.     A/P:  1. Screening for colon cancer - Colonoscopy Referral  2. Type 2 diabetes mellitus with stage 3a chronic kidney disease, without long-term current use of insulin (HCC) - controlled  - cont glucophage 500mg  BID - cont statin, ARB - Hemoglobin A1c - Urine Microalbumin w/creat. Ratio - pt due for eye exam, will schedule - f/u in 4-57mo or sooner PRN  3. Essential hypertension - controlled, at goal - cont current meds - Basic metabolic panel - Urine Microalbumin w/creat. ratio  4. Annual physical exam - immunizations UTD - due for colo - referral placed today - discussed importance of regular CV exercise, healthy diet, adequate sleep - pt due for eye and dental exam and encouraged him to schedule - labs today - per orders - next CPE in 1 year  5. Chronic diastolic CHF (congestive heart failure), NYHA class 2 ([place]) 6. History of coronary artery stent placement 7. Atrial fibrillation with RVR ([place])  7. Dyslipidemia - cont statin - due for repeat FLP  after statin was changed in 06/2019 - previously ordered by cardio, will draw today - low fat diet and regular CV exercise  This visit occurred during the SARS-CoV-2 public health emergency.  Safety protocols were  in place, including screening questions prior to the visit, additional usage of staff PPE, and extensive cleaning of exam room while observing appropriate contact time as indicated for disinfecting solutions.

## 2019-11-28 LAB — BASIC METABOLIC PANEL
BUN: 31 mg/dL — ABNORMAL HIGH (ref 6–23)
CO2: 23 mEq/L (ref 19–32)
Calcium: 9.3 mg/dL (ref 8.4–10.5)
Chloride: 105 mEq/L (ref 96–112)
Creatinine, Ser: 1.6 mg/dL — ABNORMAL HIGH (ref 0.40–1.50)
GFR: 42.58 mL/min — ABNORMAL LOW (ref >60.00)
Glucose, Bld: 135 mg/dL — ABNORMAL HIGH (ref 70–99)
Potassium: 4.4 mEq/L (ref 3.5–5.1)
Sodium: 137 mEq/L (ref 135–145)

## 2019-11-28 LAB — HEMOGLOBIN A1C: Hgb A1c MFr Bld: 7.1 % — ABNORMAL HIGH (ref 4.6–6.5)

## 2019-11-28 LAB — MICROALBUMIN / CREATININE URINE RATIO
Creatinine,U: 185.3 mg/dL
Microalb Creat Ratio: 17.8 mg/g (ref 0.0–30.0)
Microalb, Ur: 33.1 mg/dL — ABNORMAL HIGH (ref 0.0–1.9)

## 2019-11-30 ENCOUNTER — Encounter: Payer: Self-pay | Admitting: Family Medicine

## 2019-12-03 ENCOUNTER — Telehealth: Payer: Self-pay | Admitting: Family Medicine

## 2019-12-03 DIAGNOSIS — N1831 Chronic kidney disease, stage 3a: Secondary | ICD-10-CM

## 2019-12-03 DIAGNOSIS — E1122 Type 2 diabetes mellitus with diabetic chronic kidney disease: Secondary | ICD-10-CM

## 2019-12-03 MED ORDER — SITAGLIPTIN PHOSPHATE 100 MG PO TABS: 100.0000 mg | ORAL_TABLET | Freq: Every day | ORAL | 3 refills | Status: DC

## 2019-12-03 NOTE — Telephone Encounter (Signed)
I spoke with pt's wife and informed her pt's lab results.  Wife wanted to know if there is anything he can do to keep kidney levels from declining.  Please advise.

## 2019-12-03 NOTE — Telephone Encounter (Signed)
Please apologize to pt for me not reaching out sooner with his lab results. A1C increased from 6.5 to 7.1. goal is less than 7.0. His kidney function remains at/close to baseline, but has slowly declined since last year.  He should not be on metformin with his current kidney function. I will send Rx for different med which will hopefully better control his blood sugars but also not impact his kidney function

## 2019-12-03 NOTE — Telephone Encounter (Signed)
Pt informed

## 2019-12-03 NOTE — Telephone Encounter (Signed)
Good blood sugar and BP control, good water intake

## 2019-12-03 NOTE — Telephone Encounter (Signed)
LMTCB

## 2019-12-03 NOTE — Telephone Encounter (Signed)
Please see message and advise.  Thank you. ° °

## 2019-12-03 NOTE — Telephone Encounter (Signed)
Patient calling about lab results, pt has not gotten lab results.

## 2019-12-04 NOTE — Progress Notes (Signed)
Cardiology Office Note   Date:  12/05/2019   ID:  Adam Harris, DOB 02-Nov-1946, MRN [id number]  PCP:  Ronnald Nian, DO  Cardiologist:   Minus Breeding, MD    Chief Complaint  Patient presents with  . Coronary Artery Disease      History of Present Illness: Adam Harris is a 74 y.o. male who presents for atrial fib and CAD and atrial fibrillation.  Most recent cardiac cath  01/2014 revealed severe 1 vessel disease with CTO RCA with faint left to right collaterals, aneurysmal LCFx with sluggish coronary flow; normal LVSF w/ moderately elevated LVEDP (ostialOM2 20%, pOM3 20%, pD1 20%, mCFX 50%, diffuse 20% pCFX). Chronic diastolic CHF and HL.   He had fibrillation Covid late last year.  It was a mild case like his wife's.  He has had no acute shortness of breath, PND or orthopnea.  He does not notice his fibrillation.  He has had no chest pressure, neck or arm discomfort.  He has had no weight gain or edema.   Past Medical History:  Diagnosis Date  . Abnormal radiologic findings on diagnostic imaging of renal pelvis, ureter, or bladder    bilateral ureter abnormalities  . Anticoagulant long-term use    eliquis  . Arthritis   . CAD (coronary artery disease) cardiologist-- dr Jame Morrell   NSTEMI 02-04-2014  per cardiac cath chronic occluded RCA w/ faint left-to-right collaterals and aneurysmal LCFx with sluggish coronary flow/   NSTEMI --11-21-2016 per cardiac cath occluded proximal RCA & mid to diastal CFX 100%, med rx. If that does not work, PTCA or CABG  . CKD (chronic kidney disease), stage III    patient unaware  . DOE (dyspnea on exertion)   . Hematuria   . History of non-ST elevation myocardial infarction (NSTEMI)    02-04-2014  and 11-21-2016  cardiac cath done both times ,  medically management  . History of shingles 12/2017   slight pain and numbness still noted in the area  . Hypertension   . Myocardial infarction ([place]) 2015  . Persistent atrial fibrillation [facility])     cardiologsit-- dr Percival Spanish  . Type 2 diabetes mellitus ([place])     Past Surgical History:  Procedure Laterality Date  . CARDIAC CATHETERIZATION N/A 11/21/2016   Procedure: Left Heart Cath and Coronary Angiography;  Surgeon: Troy Sine, MD;  Location: [facility];  Service: Cardiovascular;  Laterality: N/A;  pRCA 100% , ostial LAD 45%, OM3 80%, mCFX to dCFX 100% (AV groove), lateral OM3 50%  . COLONOSCOPY    . CYSTOSCOPY/RETROGRADE/URETEROSCOPY Bilateral 03/18/2018   Procedure: CYSTOSCOPY/RETROGRADE/URETEROSCOPY.;  Surgeon: [name], MD;  Location: [facility];  Service: Urology;  Laterality: Bilateral;  . KNEE ARTHROSCOPY    . LEFT HEART CATHETERIZATION WITH CORONARY ANGIOGRAM N/A 02/04/2014   Procedure: LEFT HEART CATHETERIZATION WITH CORONARY ANGIOGRAM;  Surgeon: [name], MD;  Location: [facility];  Service: Cardiovascular;  Laterality: N/A;  severe one-vessel CAD, chronically occluded RCA with faint left-to-right collaterals;  aneurysmal LCFx with sluggish coronary flow;  normal LVSF w/ moderately elevated LVEDP (ostialOM2 20%, pOM3 20%, pD1 20%, mCFX 50%, diffuse 20% pCFX)  . TRANSTHORACIC ECHOCARDIOGRAM  11-22-2016   dr Khaniya Tenaglia   ef 55-60%/ mild MR and TR/ moderate LAE     Current Outpatient Medications  Medication Sig Dispense Refill  . diphenhydramine-acetaminophen (TYLENOL PM) 25-500 MG TABS tablet Take 1 tablet by mouth at bedtime as needed.    Marland Kitchen  ELIQUIS 5 MG TABS tablet TAKE 1 TABLET (5 MG TOTAL) BY MOUTH TWICE A DAY 180 tablet 1  . furosemide (LASIX) 20 MG tablet TAKE ONE TABLET BY MOUTH DAILY . MAY TAKE EXTRA TABLET IF NEEDED FOR SWELLING 45 tablet 1  . isosorbide mononitrate (IMDUR) 30 MG 24 hr tablet TAKE ONE TABLET BY MOUTH DAILY 90 tablet 2  . metoprolol tartrate (LOPRESSOR) 50 MG tablet Take 1 tablet (50 mg total) by mouth 2 (two) times daily. 180 tablet 3  . nitroGLYCERIN (NITROSTAT) 0.4 MG SL tablet PLACE ONE TABLET UNDER THE TONGUE  EVERY 5 MINUTES TIMES THREE DOSES AS NEEDED FOR CHEST PAIN. CALL 911 IF 2ND DOSE DOES NOT HELP 25 tablet 5  . Olmesartan-amLODIPine-HCTZ 40-10-12.5 MG TABS TAKE 0.5(HALF) TABLETS BY MOUTH DAILY. 30 tablet 5  . rosuvastatin (CRESTOR) 40 MG tablet Take 1 tablet (40 mg total) by mouth daily. 90 tablet 3  . sitaGLIPtin (JANUVIA) 100 MG tablet Take 1 tablet (100 mg total) by mouth daily. 90 tablet 3   No current facility-administered medications for this visit.    Allergies:   Xarelto [rivaroxaban]    ROS:  Please see the history of present illness.   Otherwise, review of systems are negative for all other.   All other systems are reviewed and negative.    PHYSICAL EXAM: VS:  BP 132/88   Pulse 78   Temp 97.6 F (36.4 C)   Ht 5\' 11"  (1.803 m)   Wt 215 lb (97.5 kg)   BMI 29.99 kg/m  , BMI Body mass index is 29.99 kg/m.  GENERAL:  Well appearing NECK:  No jugular venous distention, waveform within normal limits, carotid upstroke brisk and symmetric, no bruits, no thyromegaly LUNGS:  Clear to auscultation bilaterally CHEST:  Unremarkable HEART:  PMI not displaced or sustained,S1 and S2 within normal limits, no S3, no clicks, no rubs, no murmurs, irregular.   ABD:  Flat, positive bowel sounds normal in frequency in pitch, no bruits, no rebound, no guarding, no midline pulsatile mass, no hepatomegaly, no splenomegaly EXT:  2 plus pulses throughout, no edema, no cyanosis no clubbing   EKG:  EKG is ordered today. Atrial fibrillation, rate 78, axis within normal limits.  No changes.  Recent Labs: 02/25/2019: Hemoglobin 14.6; Platelets 196 11/27/2019: BUN 31; Creatinine, Ser 1.60; Potassium 4.4; Sodium 137    Lipid Panel    Component Value Date/Time   CHOL 149 06/25/2019 1621   TRIG 91 06/25/2019 1621   HDL 43 06/25/2019 1621   CHOLHDL 3.5 06/25/2019 1621   CHOLHDL 4.0 11/21/2016 0551   VLDL 19 11/21/2016 0551   LDLCALC 88 06/25/2019 1621      Wt Readings from Last 3 Encounters:   12/05/19 215 lb (97.5 kg)  11/27/19 213 lb 9.6 oz (96.9 kg)  05/13/19 214 lb 6.4 oz (97.3 kg)      Other studies Reviewed: Additional studies/ records that were reviewed today include: Labs Review of the above records demonstrates:   NA   ASSESSMENT AND PLAN:  CHRONIC ATRIAL FIB:  Adam Harris has a CHA2DS2 - VASc score of 3.    He is tolerating anticoagulation.  No change in therapy.   CAD:   Patient has had no chest discomfort.  No change in therapy.  HTN:  The blood pressure is at target.  No change in therapy.   CHRONIC DIASTOLIC HF: He seems to be euvolemic.  We will continue meds as listed.   DYSLIPIDEMIA:  LDL was 88 with an HDL of 43.  We can change his diet slightly and have improvement with this.  COVID EDUCATION: We talked about indications for vaccination in the weeks ahead since he has some natural immunity from his infection.  Current medicines are reviewed at length with the patient today.  The patient does not have concerns regarding medicines.  The following changes have been made:  None  Labs/ tests ordered today include:  None  No orders of the defined types were placed in this encounter.    Disposition:   FU with me in one year.    Signed, Minus Breeding, MD  12/05/2019 5:51 PM    [place]

## 2019-12-05 ENCOUNTER — Other Ambulatory Visit: Payer: Self-pay

## 2019-12-05 ENCOUNTER — Encounter: Payer: Self-pay | Admitting: Cardiology

## 2019-12-05 ENCOUNTER — Ambulatory Visit (INDEPENDENT_AMBULATORY_CARE_PROVIDER_SITE_OTHER): Payer: Medicare Other | Admitting: Cardiology

## 2019-12-05 VITALS — BP 132/88 | HR 78 | Temp 97.6°F | Ht 71.0 in | Wt 215.0 lb

## 2019-12-05 DIAGNOSIS — E785 Hyperlipidemia, unspecified: Secondary | ICD-10-CM

## 2019-12-05 DIAGNOSIS — I10 Essential (primary) hypertension: Secondary | ICD-10-CM | POA: Diagnosis not present

## 2019-12-05 DIAGNOSIS — I482 Chronic atrial fibrillation, unspecified: Secondary | ICD-10-CM

## 2019-12-05 DIAGNOSIS — I2510 Atherosclerotic heart disease of native coronary artery without angina pectoris: Secondary | ICD-10-CM | POA: Diagnosis not present

## 2019-12-05 DIAGNOSIS — Z7189 Other specified counseling: Secondary | ICD-10-CM

## 2019-12-05 NOTE — Patient Instructions (Signed)
Medication Instructions:  No Changes *If you need a refill on your cardiac medications before your next appointment, please call your pharmacy*  Lab Work: None  Testing/Procedures: None  Follow-Up: At [facility], you and your health needs are our priority.  As part of our continuing mission to provide you with exceptional heart care, we have created designated Provider Care Teams.  These Care Teams include your primary Cardiologist (physician) and Advanced Practice Providers (APPs -  Physician Assistants and Nurse Practitioners) who all work together to provide you with the care you need, when you need it.  Your next appointment:   1 year(s)  You will receive a reminder letter in the mail two months in advance. If you don't receive a letter, please call our office to schedule the follow-up appointment.   The format for your next appointment:   In Person  Provider:   James Hochrein, MD   

## 2019-12-07 NOTE — Progress Notes (Deleted)
     12/07/2019 Adam Harris [id number] [birth year]  Chief Complaint:   History of Present Illness: Adam Harris is a 74 year old male with a past medical history of hypertension, coronary artery disease, NSTEMI 01/2014 and 02/5037, Diastolic CHF, atrial fibrillation on Eliquis, DM II, CKD stage III, hematuria and COVID 19 last year.   Flexible sigmoidoscopy by [facility] 20 years ago. No colon cancer screening since then.   He was seen by his cardiologist, Dr. Minus Breeding, on 12/05/2019. His EKG was stable, afib rate 78 b/min. Follow up in 1 year.   He is not a candidate for stenting due to the significant aneurysmal dilatations throughout his vessels  Hematuria cystoscopy scheduled for 12/15/19 with Dr. Karsten Ro.   ??? Prostate cancer 2014 listed in care everywhere  BMP Latest Ref Rng & Units 11/27/2019 02/25/2019 03/18/2018  Glucose 70 - 99 mg/dL 135(H) 198(H) 118(H)  BUN 6 - 23 mg/dL 31(H) 30(H) 27(H)  Creatinine 0.40 - 1.50 mg/dL 1.60(H) 1.55(H) 1.30(H)  BUN/Creat Ratio 10 - 24 - - -  Sodium 135 - 145 mEq/L 137 137 141  Potassium 3.5 - 5.1 mEq/L 4.4 4.2 3.8  Chloride 96 - 112 mEq/L 105 106 105  CO2 19 - 32 mEq/L 23 22 -  Calcium 8.4 - 10.5 mg/dL 9.3 9.2 -    CBC Latest Ref Rng & Units 02/25/2019 03/18/2018 01/09/2018  WBC 4.0 - 10.5 K/uL 8.6 - 9.5  Hemoglobin 13.0 - 17.0 g/dL 14.6 12.9(L) 14.3  Hematocrit 39.0 - 52.0 % 45.1 38.0(L) 42.1  Platelets 150 - 400 K/uL 196 - 132(L)    Current Medications, Allergies, Past Medical History, Past Surgical History, Family History and Social History were reviewed in [employer] record.   Physical Exam: There were no vitals taken for this visit. General: Well developed , w   ***male in no acute distress Head: Normocephalic and atraumatic Eyes:  sclerae anicteric, conjunctiva pink  Ears: Normal auditory acuity Lungs: Clear throughout to auscultation Heart: Regular rate and rhythm Abdomen: Soft, non tender and non  distended. No masses, no hepatomegaly. Normal bowel sounds Rectal: *** Musculoskeletal: Symmetrical with no gross deformities  Extremities: No edema  Neurological: Alert oriented x 4, grossly nonfocal Psychological:  Alert and cooperative. Normal mood and affect  Assessment and Recommendations: 1.    2. Atrial fibrillation on Eliquis.  CHA2DS2 - VASc score of 3.      3. CAD. CHF-D.

## 2019-12-08 ENCOUNTER — Emergency Department (HOSPITAL_BASED_OUTPATIENT_CLINIC_OR_DEPARTMENT_OTHER): Payer: Medicare Other

## 2019-12-08 ENCOUNTER — Emergency Department (HOSPITAL_BASED_OUTPATIENT_CLINIC_OR_DEPARTMENT_OTHER)
Admission: EM | Admit: 2019-12-08 | Discharge: 2019-12-08 | Disposition: A | Discharge status: Home / Self Care | Payer: Medicare Other | Attending: Emergency Medicine | Admitting: Emergency Medicine

## 2019-12-08 ENCOUNTER — Ambulatory Visit: Payer: Medicare Other | Admitting: Nurse Practitioner

## 2019-12-08 ENCOUNTER — Encounter (HOSPITAL_BASED_OUTPATIENT_CLINIC_OR_DEPARTMENT_OTHER): Payer: Self-pay | Admitting: Emergency Medicine

## 2019-12-08 ENCOUNTER — Other Ambulatory Visit: Payer: Self-pay

## 2019-12-08 DIAGNOSIS — M064 Inflammatory polyarthropathy: Secondary | ICD-10-CM | POA: Insufficient documentation

## 2019-12-08 DIAGNOSIS — I4891 Unspecified atrial fibrillation: Secondary | ICD-10-CM | POA: Diagnosis not present

## 2019-12-08 DIAGNOSIS — I5032 Chronic diastolic (congestive) heart failure: Secondary | ICD-10-CM | POA: Insufficient documentation

## 2019-12-08 DIAGNOSIS — M1711 Unilateral primary osteoarthritis, right knee: Secondary | ICD-10-CM | POA: Diagnosis not present

## 2019-12-08 DIAGNOSIS — I252 Old myocardial infarction: Secondary | ICD-10-CM | POA: Diagnosis not present

## 2019-12-08 DIAGNOSIS — I2510 Atherosclerotic heart disease of native coronary artery without angina pectoris: Secondary | ICD-10-CM | POA: Insufficient documentation

## 2019-12-08 DIAGNOSIS — E1122 Type 2 diabetes mellitus with diabetic chronic kidney disease: Secondary | ICD-10-CM | POA: Insufficient documentation

## 2019-12-08 DIAGNOSIS — Z7984 Long term (current) use of oral hypoglycemic drugs: Secondary | ICD-10-CM | POA: Diagnosis not present

## 2019-12-08 DIAGNOSIS — Z888 Allergy status to other drugs, medicaments and biological substances status: Secondary | ICD-10-CM | POA: Diagnosis not present

## 2019-12-08 DIAGNOSIS — Z79899 Other long term (current) drug therapy: Secondary | ICD-10-CM | POA: Insufficient documentation

## 2019-12-08 DIAGNOSIS — M25561 Pain in right knee: Secondary | ICD-10-CM | POA: Diagnosis present

## 2019-12-08 DIAGNOSIS — M1990 Unspecified osteoarthritis, unspecified site: Secondary | ICD-10-CM

## 2019-12-08 DIAGNOSIS — Z9861 Coronary angioplasty status: Secondary | ICD-10-CM | POA: Insufficient documentation

## 2019-12-08 DIAGNOSIS — N183 Chronic kidney disease, stage 3 unspecified: Secondary | ICD-10-CM | POA: Diagnosis not present

## 2019-12-08 DIAGNOSIS — I130 Hypertensive heart and chronic kidney disease with heart failure and stage 1 through stage 4 chronic kidney disease, or unspecified chronic kidney disease: Secondary | ICD-10-CM | POA: Insufficient documentation

## 2019-12-08 LAB — SYNOVIAL CELL COUNT + DIFF, W/ CRYSTALS
Crystals, Fluid: NONE SEEN
Eosinophils-Synovial: 0 % (ref 0–1)
Lymphocytes-Synovial Fld: 0 % (ref 0–20)
Monocyte-Macrophage-Synovial Fluid: 39 % — ABNORMAL LOW (ref 50–90)
Neutrophil, Synovial: 61 % — ABNORMAL HIGH (ref 0–25)
WBC, Synovial: 12000 /mm3 — ABNORMAL HIGH (ref 0–200)

## 2019-12-08 LAB — CBG MONITORING, ED: Glucose-Capillary: 154 mg/dL — ABNORMAL HIGH (ref 70–99)

## 2019-12-08 LAB — URIC ACID: Uric Acid, Serum: 5.3 mg/dL (ref 3.7–8.6)

## 2019-12-08 MED ORDER — LIDOCAINE HCL 1 % IJ SOLN
5.0000 mL | Freq: Once | INTRAMUSCULAR | Status: AC
  Administered 2019-12-08: 5 mL
  Filled 2019-12-08: qty 20

## 2019-12-08 MED ORDER — PREDNISONE 20 MG PO TABS
30.0000 mg | ORAL_TABLET | Freq: Once | ORAL | Status: AC
  Administered 2019-12-08: 30 mg via ORAL
  Filled 2019-12-08: qty 1

## 2019-12-08 MED ORDER — PREDNISONE 10 MG PO TABS: 30.0000 mg | ORAL_TABLET | Freq: Every day | ORAL | 0 refills | Status: AC

## 2019-12-08 MED FILL — predniSONE 10 MG TABS: 10 | 4 days supply | Qty: 12 | Fill #0

## 2019-12-08 NOTE — ED Notes (Signed)
Dr. Rees at bedside  

## 2019-12-08 NOTE — Addendum Note (Signed)
Addended by: Therisa Doyne on: 12/08/2019 05:31 PM   Modules accepted: Orders

## 2019-12-08 NOTE — ED Triage Notes (Signed)
Right knee pain started during the middle of the night.  Got up and took hot bath and took Aleve.  Denies known injury.  Sts he worked in the yard yesterday but does not remember injuring it. It was fine yesterday.

## 2019-12-08 NOTE — ED Provider Notes (Signed)
[facility] EMERGENCY DEPARTMENT Provider Note   CSN: [id number] Arrival date & time: 12/08/19  4098     History Chief Complaint  Patient presents with  . Knee Pain    Dannel Althaus is a 74 y.o. male.  The history is provided by the patient and medical records. No language interpreter was used.  Knee Pain  Jabier Bartoli is a 74 y.o. male who presents to the Emergency Department complaining of right knee pain. He presents the emergency department complaining of right knee pain that began last night, waking him from sleep. Pain is located throughout the anterior and posterior aspect of the knee and is worse with range of motion and ambulation. He woke up in the night and took a warm bath and took two aleve. He states that his pain is mildly improved but still persists. He worked in the yard yesterday but does not recall any injuries. No prior similar symptoms. He does have a history of atrial fibrillation and takes eliquis. He did not take it this morning. He also has a history of CKD. No history of gout.    Past Medical History:  Diagnosis Date  . Abnormal radiologic findings on diagnostic imaging of renal pelvis, ureter, or bladder    bilateral ureter abnormalities  . Anticoagulant long-term use    eliquis  . Arthritis   . CAD (coronary artery disease) cardiologist-- dr hochrein   NSTEMI 02-04-2014  per cardiac cath chronic occluded RCA w/ faint left-to-right collaterals and aneurysmal LCFx with sluggish coronary flow/   NSTEMI --11-21-2016 per cardiac cath occluded proximal RCA & mid to diastal CFX 100%, med rx. If that does not work, PTCA or CABG  . CKD (chronic kidney disease), stage III    patient unaware  . DOE (dyspnea on exertion)   . Hematuria   . History of non-ST elevation myocardial infarction (NSTEMI)    02-04-2014  and 11-21-2016  cardiac cath done both times ,  medically management  . History of shingles 12/2017   slight pain and numbness still noted in the area    . Hypertension   . Myocardial infarction ([place]) 2015  . Persistent atrial fibrillation [facility])    cardiologsit-- dr Percival Spanish  . Type 2 diabetes mellitus [facility])     Patient Active Problem List   Diagnosis Date Noted  . Educated about COVID-19 virus infection 10/28/2019  . Shingles 01/10/2018  . Chronic diastolic CHF (congestive heart failure), NYHA class 2 ([place]) 04/02/2017  . Atrial fibrillation with RVR ([place]) 11/20/2016  . Degenerative arthritis of knee, bilateral 05/31/2016  . Low back pain 05/31/2016  . Routine general medical examination at a health care facility 12/25/2014  . Exertional angina ([place]) 10/29/2014  . Atrial fibrillation, new onset ([place]) 10/29/2014  . Chronic renal disease, stage III 10/29/2014  . History of coronary artery stent placement 07/24/2014  . Coronary artery disease 03/09/2014  . Diabetes mellitus, type 2 ([place]) 02/17/2014  . Dyslipidemia 02/05/2014  . NSTEMI (non-ST elevated myocardial infarction) ([place]) 02/04/2014  . History of non-ST elevation myocardial infarction (NSTEMI) 02/03/2014  . Essential hypertension 02/03/2014  . Chest pain 02/03/2014  . Abnormal prostate specific antigen 04/19/2013  . Anxiety state 04/19/2013  . Increased frequency of urination 04/19/2013  . Malignant neoplasm of prostate ([place]) 04/19/2013  . Insomnia 04/19/2013  . Stiffness of joint, lower leg 04/19/2013  . Sciatica 04/19/2013  . Tension type headache 04/19/2013    Past Surgical History:  Procedure Laterality Date  . CARDIAC  CATHETERIZATION N/A 11/21/2016   Procedure: Left Heart Cath and Coronary Angiography;  Surgeon: Troy Sine, MD;  Location: [facility] CV LAB;  Service: Cardiovascular;  Laterality: N/A;  pRCA 100% , ostial LAD 45%, OM3 80%, mCFX to dCFX 100% (AV groove), lateral OM3 50%  . COLONOSCOPY    . CYSTOSCOPY/RETROGRADE/URETEROSCOPY Bilateral 03/18/2018   Procedure: CYSTOSCOPY/RETROGRADE/URETEROSCOPY.;  Surgeon: [name], MD;  Location: [facility];  Service: Urology;  Laterality: Bilateral;  . KNEE ARTHROSCOPY    . LEFT HEART CATHETERIZATION WITH CORONARY ANGIOGRAM N/A 02/04/2014   Procedure: LEFT HEART CATHETERIZATION WITH CORONARY ANGIOGRAM;  Surgeon: [name], MD;  Location: [facility] CATH LAB;  Service: Cardiovascular;  Laterality: N/A;  severe one-vessel CAD, chronically occluded RCA with faint left-to-right collaterals;  aneurysmal LCFx with sluggish coronary flow;  normal LVSF w/ moderately elevated LVEDP (ostialOM2 20%, pOM3 20%, pD1 20%, mCFX 50%, diffuse 20% pCFX)  . TRANSTHORACIC ECHOCARDIOGRAM  11-22-2016   dr hochrein   ef 55-60%/ mild MR and TR/ moderate LAE       Family History  Problem Relation Age of Onset  . Hypertension Mother     Social History   Tobacco Use  . Smoking status: Never Smoker  . Smokeless tobacco: Never Used  Substance Use Topics  . Alcohol use: No  . Drug use: No    Home Medications Prior to Admission medications   Medication Sig Start Date End Date Taking? Authorizing Provider  diphenhydramine-acetaminophen (TYLENOL PM) 25-500 MG TABS tablet Take 1 tablet by mouth at bedtime as needed.    [provider]  ELIQUIS 5 MG TABS tablet TAKE 1 TABLET (5 MG TOTAL) BY MOUTH TWICE A DAY 02/10/19   Minus Breeding, MD  furosemide (LASIX) 20 MG tablet TAKE ONE TABLET BY MOUTH DAILY . MAY TAKE EXTRA TABLET IF NEEDED FOR SWELLING 11/21/19   Minus Breeding, MD  isosorbide mononitrate (IMDUR) 30 MG 24 hr tablet TAKE ONE TABLET BY MOUTH DAILY 07/07/19   Minus Breeding, MD  metoprolol tartrate (LOPRESSOR) 50 MG tablet Take 1 tablet (50 mg total) by mouth 2 (two) times daily. 05/13/19 08/11/19  Minus Breeding, MD  nitroGLYCERIN (NITROSTAT) 0.4 MG SL tablet PLACE ONE TABLET UNDER THE TONGUE EVERY 5 MINUTES TIMES THREE DOSES AS NEEDED FOR CHEST PAIN. CALL 911 IF 2ND DOSE DOES NOT HELP 08/26/19   Minus Breeding, MD  Olmesartan-amLODIPine-HCTZ 40-10-12.5 MG TABS TAKE 0.5(HALF) TABLETS BY  MOUTH DAILY. 03/06/19   Almyra Deforest, PA  predniSONE (DELTASONE) 10 MG tablet Take 3 tablets (30 mg total) by mouth daily for 4 days. 12/08/19 12/12/19  Quintella Reichert, MD  rosuvastatin (CRESTOR) 40 MG tablet Take 1 tablet (40 mg total) by mouth daily. 07/08/19   Minus Breeding, MD  sitaGLIPtin (JANUVIA) 100 MG tablet Take 1 tablet (100 mg total) by mouth daily. 12/03/19   CiriglianoGarvin Fila, DO    Allergies    Xarelto [rivaroxaban]  Review of Systems   Review of Systems  All other systems reviewed and are negative.   Physical Exam Updated Vital Signs BP 125/86 (BP Location: Right Arm)   Pulse 68   Temp 97.7 F (36.5 C) (Oral)   Resp 18   Ht 6' (1.829 m)   Wt 94.3 kg   SpO2 98%   BMI 28.18 kg/m   Physical Exam Vitals and nursing note reviewed.  Constitutional:      Appearance: He is well-developed.  HENT:     Head: Normocephalic and atraumatic.  Cardiovascular:     Rate and Rhythm: Normal rate and regular rhythm.  Pulmonary:     Effort: Pulmonary effort is normal. No respiratory distress.  Musculoskeletal:     Comments: Moderate edema to the right knee with decreased range of motion in the right knee. There is no significant tenderness to palpation throughout the knee. There is no overlying erythema. 2+ DP pulses bilaterally.  Skin:    General: Skin is warm and dry.  Neurological:     Mental Status: He is alert and oriented to person, place, and time.  Psychiatric:        Behavior: Behavior normal.     ED Results / Procedures / Treatments   Labs (all labs ordered are listed, but only abnormal results are displayed) Labs Reviewed  SYNOVIAL CELL COUNT + DIFF, W/ CRYSTALS - Abnormal; Notable for the following components:      Result Value   Appearance-Synovial CLOUDY (*)    WBC, Synovial 12,000 (*)    Neutrophil, Synovial 61 (*)    Monocyte-Macrophage-Synovial Fluid 39 (*)    All other components within normal limits  CBG MONITORING, ED - Abnormal; Notable for the  following components:   Glucose-Capillary 154 (*)    All other components within normal limits  BODY FLUID CULTURE  URIC ACID  GLUCOSE, BODY FLUID OTHER    EKG None  Radiology DG Knee Complete 4 Views Right  Result Date: 12/08/2019 CLINICAL DATA:  Right knee pain EXAM: RIGHT KNEE - COMPLETE 4+ VIEW COMPARISON:  2017 FINDINGS: Alignment is anatomic. Joint effusion is present. There are tricompartmental changes of osteoarthritis my greatest in the medial and patellofemoral compartments. Medial compartment joint space narrowing is similar to the prior study. Vascular calcification is present. IMPRESSION: Tricompartmental osteoarthritis. Electronically Signed   By: Macy Mis M.D.   On: 12/08/2019 09:08    Procedures .Joint Aspiration/Arthrocentesis  Date/Time: 12/08/2019 9:35 AM Performed by: Quintella Reichert, MD Authorized by: Quintella Reichert, MD   Consent:    Consent obtained:  Verbal   Consent given by:  Patient   Risks discussed:  Bleeding, infection and pain Location:    Location:  Knee   Knee:  R knee Anesthesia (see MAR for exact dosages):    Anesthesia method:  Local infiltration   Local anesthetic:  Lidocaine 1% w/o epi Procedure details:    Preparation: Patient was prepped and draped in usual sterile fashion     Needle gauge:  18 G   Ultrasound guidance: no     Approach:  Lateral   Aspirate amount:  20   Aspirate characteristics:  Yellow   Steroid injected: no     Specimen collected: yes   Post-procedure details:    Dressing:  Adhesive bandage   Patient tolerance of procedure:  Tolerated well, no immediate complications   (including critical care time)  Medications Ordered in ED Medications  predniSONE (DELTASONE) tablet 30 mg (has no administration in time range)  lidocaine (XYLOCAINE) 1 % (with pres) injection 5 mL (5 mLs Infiltration Given 12/08/19 0947)    ED Course  I have reviewed the triage vital signs and the nursing  notes.  Pertinent labs & imaging results that were available during my care of the patient were reviewed by me and considered in my medical decision making (see chart for details).    MDM Rules/Calculators/A&P                     patient here for evaluation  of right knee pain and swelling. He is non-toxic appearing on evaluation. He does have decreased range of motion and the right knee effusion without any overlying erythema. Joint aspiration performed per procedure note. Synovial fluid is not consistent with septic arthritis. There is elevation in  WBCs consistent with inflammatory arthritis. Will treat with course of prednisone. Discussed with patient home care for knee pain and swelling. Discussed outpatient follow-up and return precautions.  Final Clinical Impression(s) / ED Diagnoses Final diagnoses:  Inflammatory arthritis    Rx / DC Orders ED Discharge Orders         Ordered    predniSONE (DELTASONE) 10 MG tablet  Daily     12/08/19 1231           Quintella Reichert, MD 12/08/19 1236

## 2019-12-09 LAB — GLUCOSE, BODY FLUID OTHER: Glucose, Body Fluid Other: 139 mg/dL

## 2019-12-09 NOTE — Progress Notes (Signed)
Copy of covid positive test result from 10-21-2019 [facility] on chart, patient stated covid symptoms were coughing and weakness and resolved during 14 day quarantine. No coughing or weakness now per patient.

## 2019-12-10 ENCOUNTER — Other Ambulatory Visit: Payer: Self-pay

## 2019-12-10 ENCOUNTER — Encounter (HOSPITAL_BASED_OUTPATIENT_CLINIC_OR_DEPARTMENT_OTHER): Payer: Self-pay | Admitting: Urology

## 2019-12-10 NOTE — Progress Notes (Signed)
Spoke with: Benino NPO:  After Midnight, no gum, candy, or mints   Arrival time: 0530 AM Labs: Istat 8 (EKG 11/2019, HGb A1C 11/2019 in epic) AM medications: Isosorbide, Metoprolol, Rosuvastatin Pre op orders: Yes Ride home:  Manuela Schwartz (wife) [phone] or Larkin Ina (son) [phone] COVID-19 positive 10/21/2019 had cough and fatigue, denies any symptoms in the last 14 days.

## 2019-12-11 LAB — BODY FLUID CULTURE: Culture: NO GROWTH

## 2019-12-14 NOTE — H&P (Signed)
HPI: Adam Harris is a 74 year-old male with history of gross hematuria and an abnormal right renal pelvis by CT scan.  He first noticed the symptoms approximately 10/13/2018. He did not see the blood in his urine. He has not seen blood clots.   He does have a burning sensation when he urinates. He is not currently having trouble urinating.   He has not had kidney stones. He is not having pain. He has not recently had unwanted weight loss.   10/02/19: He came in to see me today because for the last 2-3 years he has had progressively worsening lower urinary tract symptoms consisting of nocturia every 1-2 hours. This is associated with hesitancy and decreased force of urinary stream as well as a prolonged stream but not associated with any hematuria or difficulty with recurrent infections. He has had a stone in the past but is not experiencing any flank pain.   10/21/19: He has come in to see me today because he has continued to see intermittent gross hematuria. He said when it occurs he has some pain in the right flank region. His urine then clears and his pain resolved. He does not see any stones pass nor does he passed any clots. The last time this occurred was 2 days ago. The pain is not severe. He has been completely evaluated for an abnormality of his right ureter with ureteroscopy and far Barbot[place] urine specimen for cytology which were negative as well as urine cytology on follow-up which was also negative.     ALLERGIES: No Allergies    MEDICATIONS: Sildenafil Citrate 20 mg tablet 1-2 tablet PO PRN  Atorvastatin Calcium 20 MG Oral Tablet Oral  Eliquis 5 mg tablet Oral  MetFORMIN HCl - 500 MG Oral Tablet Oral  Metoprolol Tartrate 25 MG Oral Tablet Oral  Nitrostat 0.4 MG Sublingual Tablet Sublingual Sublingual  Tribenzor 40 mg-10 mg-25 mg tablet Oral     GU PSH: Cystoscopy - 2019 Cystoscopy Ureteroscopy, Bilateral - 2019 Locm 300-399Mg /Ml Iodine,1Ml - 2019 Prostate Needle Biopsy -  2018       PSH Notes: Knee Arthroscopy   NON-GU PSH: Surgical Pathology, Gross And Microscopic Examination For Prostate Needle - 2018     GU PMH: Gross hematuria, He experienced gross hematuria. He is on Eliquis. My suspicion is that this is going to be from the prostate since no other abnormality was noted previously. - 02/27/2019 Hydronephrosis (Stable), Right, He has mild right hydronephrosis noted on his CT scan that is unchanged and has been evaluated and noted to be nonobstructive in nature. - 02/27/2019 Prostate Cancer (Stable), His prostate remains benign to exam. I will obtain a PSA and have recommended he return in 6 months for repeat DRE and PSA. - 02/27/2019, (Stable), His prostate was noted to be entirely benign today. I will obtain a PSA today since he has due for a recheck., - 2019 (Stable), I went over his pathology report with him today which has revealed no evidence of grade or stage progression of his low risk prostate cancer. My recommendation to him has been continued active surveillance and he has agreed with this plan., - 2018 (Stable), I have discussed with the patient the possibility of blood per rectum, per urethra and in the ejaculate. He was counseled to contact me if he has any difficulties following his prostate biopsy whatsoever., - 2018 (Stable), His prostate was noted to be smooth and benign to examination however his PSA has risen significantly. It was  8.3 when we did his biopsy initially and then came down quite low. I recommended we repeat the PSA today but also I told him this time we proceed with a repeat biopsy., - 2018 (Stable), His prostate remains benign on his examination and his PSA continues to remain low and stable. I will continue active surveillance with DRE and PSA again in 6 months., - 2017, Adenocarcinoma of prostate, - 2017 Abnormal radiologic findings on diagnostic imaging of of renal pelvis, ureter, or bladder, Bilateral, Due to the finding of  abnormalities of both his right ureter and left renal pelvic region we are going to proceed with further evaluation with cystoscopy, bilateral retrograde pyelography, bilateral ureteroscopy and possible bilateral double-J stent placement. - 2019 Microscopic hematuria, He was noted to have microscopic hematuria today. We therefore have discussed the need for further evaluation. He did not appear to have any infection but did have a few white cells so I am going to culture his urine. We will then obtain a creatinine and schedule him for a CT scan to evaluate the upper tract and will then have him return for lower tract evaluation with cystoscopy. - 2019 BPH w/o LUTS (Stable), He does have some slight prostatic enlargement on exam but does not have significant voiding symptoms. - 2018 BPH w/LUTS (Stable), He has some BPH by exam with nocturia 2 but he said it is not significant enough that he would want to consider any form of pharmacologic therapy at this time. - 2017, Benign prostatic hyperplasia with urinary obstruction, - 2016 Nocturia (Stable), His nocturia is not a significant bother to him. - 2017 Hydrocele, Unspec, Hydrocele, left - 37 Male ED, unspecified, Erectile dysfunction - 2015      PMH Notes: Adenocarcinoma of the prostate: His PSA in 1/13 was found to be 8.33. He had no worrisome nodularity or induration noted on DRE but did have a mild rectal stricture. He underwent TRUS/BX on 01/18/12 at which time his prostate was noted to be 56 cc.  Pathology: He was found to have a single core positive for Gleason 3+3 = 6 adenocarcinoma involving 10% of that core.  Treatment: Active surveillance  Repeat TRUS/BX 07/24/17: Prostate volume - 57 cc  Pathology: 1 positive for Gleason 6 in 5% from the left apex laterally.  Treatment: Continued active surveillance.    BPH with LUTS: He reported having some slowing of his urinary stream as well as nocturia.  Treatment: None currently   Left hydrocele:  At one point he found it to be a bother but he says it really not giving him any trouble other than occasionally when he crosses his legs. It is not increasing in size.   Microscopic hematuria: He was noted to have microscopic hematuria in 2/19. Evaluation with a CT scan revealed possible abnormalities of the right ureter and left renal pelvic region and therefore on 03/18/18 he underwent cystoscopy with bilateral retrogrades and bilateral ureteroscopy. No abnormalities were noted. Are because urine from the right ureter was negative.  Cytology 4 /20: Negative.      NON-GU PMH: Encounter for general adult medical examination without abnormal findings, Encounter for preventive health examination - 2017 Atherosclerotic heart disease of native coronary artery without angina pectoris, CAD (coronary artery disease) - 2016 Personal history of other diseases of the circulatory system, History of angina pectoris - 2016, History of atrial fibrillation, - 2016, History of hypertension, - 2014 Personal history of other endocrine, nutritional and metabolic disease, History of type 2  diabetes mellitus - 2016, History of hypercholesterolemia, - 2016 Stenosis of anus and rectum, Rectal stricture - 2016    FAMILY HISTORY: Family Health Status - Mother's Age - Runs In Family Family Health Status Number - Runs In Family Father Deceased At [place] ___ - Runs In Family Hypertension - Mother   SOCIAL HISTORY: Marital Status: Married Preferred Language: English; Race: White Has never drank.  Drinks 3 caffeinated drinks per day.     Notes: Former smoker, Occupation:, Marital History - Currently Married, Caffeine Use, Alcohol Use   REVIEW OF SYSTEMS:    GU Review Male:   Patient denies frequent urination, hard to postpone urination, burning/ pain with urination, get up at night to urinate, leakage of urine, stream starts and stops, trouble starting your stream, have to strain to urinate , erection problems, and  penile pain.  Gastrointestinal (Upper):   Patient denies nausea, vomiting, and indigestion/ heartburn.  Gastrointestinal (Lower):   Patient denies diarrhea and constipation.  Constitutional:   Patient denies fever, night sweats, weight loss, and fatigue.  Skin:   Patient denies skin rash/ lesion and itching.  Eyes:   Patient denies blurred vision and double vision.  Ears/ Nose/ Throat:   Patient denies sore throat and sinus problems.  Hematologic/Lymphatic:   Patient denies swollen glands and easy bruising.  Cardiovascular:   Patient denies leg swelling and chest pains.  Respiratory:   Patient denies cough and shortness of breath.  Endocrine:   Patient denies excessive thirst.  Musculoskeletal:   Patient reports back pain. Patient denies joint pain.  Neurological:   Patient denies headaches and dizziness.  Psychologic:   Patient denies depression and anxiety.   VITAL SIGNS:    Weight 215 lb / 97.52 kg  Height 72 in / 182.88 cm  BP 155/89 mmHg  Pulse 84 /min  Temperature 97.5 F / 36.3 C  BMI 29.2 kg/m   Physical Exam  Constitutional: Well nourished and well developed . No acute distress.   ENT: The ears and nose are normal in appearance.   Neck: The appearance of the neck is normal and no neck mass is present.   Pulmonary: No respiratory distress and normal respiratory rhythm and effort.   Cardiovascular: Heart rate and rhythm are normal . No peripheral edema.   Abdomen: The abdomen is soft and nontender. No masses are palpated. No CVA tenderness. No hernias are palpable. No hepatosplenomegaly noted.   Rectal: Rectal exam demonstrates normal sphincter tone, no tenderness and no masses. Estimated prostate size is 1+. He has a mild rectal stricture. The prostate has no nodularity and is not tender. The left seminal vesicle is nonpalpable. The right seminal vesicle is nonpalpable. The perineum is normal on inspection.   Genitourinary: Examination of the penis demonstrates  no discharge, no masses, no lesions and a normal meatus. The penis is uncircumcised. The scrotum is without lesions. Examination of the left scrotum demostrates a hydrocele. The right epididymis is palpably normal and non-tender. The left epididymis is palpably normal and non-tender. The right testis is non-tender and without masses. The left testis is non-tender and without masses.   Lymphatics: The femoral and inguinal nodes are not enlarged or tender.   Skin: Normal skin turgor, no visible rash and no visible skin lesions.   Neuro/Psych:. Mood and affect are appropriate.   PAST DATA REVIEWED:  Source Of History:  Patient  Lab Test Review:   Urine Cytology, BUN/Creatinine  Records Review:   Previous Patient Records, POC  Tool   02/27/19 01/08/18 07/11/17 06/07/17 07/19/16 12/14/15 05/28/15 04/21/14  PSA  Total PSA 3.27 ng/mL 1.67 ng/mL 4.28 ng/mL 10.80 ng/mL 1.92 ng/dl 1.68  3.03  2.71    Notes:                     His cytology in 4/20 was negative. His creatinine in 4/20 was 1.55.   PROCEDURES:          Urinalysis w/Scope Dipstick Dipstick Cont'd Micro  Color: Brown Bilirubin: Neg mg/dL WBC/hpf: 0 - 5/hpf  Appearance: Cloudy Ketones: Neg mg/dL RBC/hpf: >60/hpf  Specific Gravity: 1.025 Blood: 3+ ery/uL Bacteria: Rare (0-9/hpf)  pH: 6.5 Protein: 3+ mg/dL Cystals: NS (Not Seen)  Glucose: Neg mg/dL Urobilinogen: 1.0 mg/dL Casts: Granular    Nitrites: Neg Trichomonas: Not Present    Leukocyte Esterase: Trace leu/uL Mucous: Not Present      Epithelial Cells: NS (Not Seen)      Yeast: NS (Not Seen)      Sperm: Not Present    ASSESSMENT/PLAN:      ICD-10 Details  1 GU:   Gross hematuria - R31.0 Continues to have intermittent gross hematuria. It seems to be associated with some pain in his right flank region but no history of stones. With his previous abnormality of his ureter I have recommended we repeat a CT scan with and without contrast. The CT scan revealed progression the soft  tissue process in the area of the right renal pelvis. This has been evaluated ureteroscopically in the past and found to be benign but there appears to have been progression and I therefore have recommended further evaluation with repeat cystoscopy, right retrograde pyelogram, right ureteroscopy with biopsy of the renal pelvic mucosa and probable stent. We discussed procedure in detail and he has undergone an identical procedure little over year ago. We did discuss the fact that my concern is that of urothelial malignancy. He will be scheduled for surgery. 2   Prostate Cancer - C61 Stable - He has prostate cancer and is undergoing active surveillance and so I will obtain PSA today as well.

## 2019-12-14 NOTE — Anesthesia Preprocedure Evaluation (Addendum)
Anesthesia Evaluation  Patient identified by MRN, date of birth, ID band Patient awake    Reviewed: Allergy & Precautions, NPO status , Patient's Chart, lab work & pertinent test results  History of Anesthesia Complications Negative for: history of anesthetic complications  Airway Mallampati: II  TM Distance: >3 FB Neck ROM: Full    Dental  (+) Edentulous Upper, Edentulous Lower   Pulmonary neg pulmonary ROS,    Pulmonary exam normal        Cardiovascular hypertension, Pt. on medications + angina + CAD and + Past MI  Normal cardiovascular exam+ dysrhythmias Atrial Fibrillation      Neuro/Psych negative neurological ROS  negative psych ROS   GI/Hepatic negative GI ROS, Neg liver ROS,   Endo/Other  diabetes, Type 2, Oral Hypoglycemic Agents  Renal/GU      Musculoskeletal   Abdominal   Peds  Hematology negative hematology ROS (+)   Anesthesia Other Findings   Reproductive/Obstetrics                            Anesthesia Physical  Anesthesia Plan  ASA: III  Anesthesia Plan: General   Post-op Pain Management:    Induction: Intravenous  PONV Risk Score and Plan: 3 and Ondansetron and Dexamethasone  Airway Management Planned: LMA  Additional Equipment:   Intra-op Plan:   Post-operative Plan: Extubation in OR  Informed Consent: I have reviewed the patients History and Physical, chart, labs and discussed the procedure including the risks, benefits and alternatives for the proposed anesthesia with the patient or authorized representative who has indicated his/her understanding and acceptance.     Dental advisory given  Plan Discussed with: Anesthesiologist and CRNA  Anesthesia Plan Comments:        Anesthesia Quick Evaluation

## 2019-12-15 ENCOUNTER — Ambulatory Visit (HOSPITAL_BASED_OUTPATIENT_CLINIC_OR_DEPARTMENT_OTHER)
Admission: RE | Admit: 2019-12-15 | Discharge: 2019-12-15 | Disposition: A | Discharge status: Home / Self Care | Payer: Medicare Other | Attending: Urology | Admitting: Urology

## 2019-12-15 ENCOUNTER — Encounter (HOSPITAL_BASED_OUTPATIENT_CLINIC_OR_DEPARTMENT_OTHER): Payer: Self-pay | Admitting: Urology

## 2019-12-15 ENCOUNTER — Ambulatory Visit (HOSPITAL_BASED_OUTPATIENT_CLINIC_OR_DEPARTMENT_OTHER): Payer: Medicare Other | Admitting: Anesthesiology

## 2019-12-15 ENCOUNTER — Other Ambulatory Visit: Payer: Self-pay

## 2019-12-15 ENCOUNTER — Encounter (HOSPITAL_BASED_OUTPATIENT_CLINIC_OR_DEPARTMENT_OTHER)
Admission: RE | Disposition: A | Discharge status: Home / Self Care | Payer: Self-pay | Source: Home / Self Care | Attending: Urology

## 2019-12-15 DIAGNOSIS — I2510 Atherosclerotic heart disease of native coronary artery without angina pectoris: Secondary | ICD-10-CM | POA: Diagnosis not present

## 2019-12-15 DIAGNOSIS — I214 Non-ST elevation (NSTEMI) myocardial infarction: Secondary | ICD-10-CM | POA: Diagnosis not present

## 2019-12-15 DIAGNOSIS — I252 Old myocardial infarction: Secondary | ICD-10-CM | POA: Insufficient documentation

## 2019-12-15 DIAGNOSIS — E119 Type 2 diabetes mellitus without complications: Secondary | ICD-10-CM | POA: Insufficient documentation

## 2019-12-15 DIAGNOSIS — I10 Essential (primary) hypertension: Secondary | ICD-10-CM | POA: Insufficient documentation

## 2019-12-15 DIAGNOSIS — Z79899 Other long term (current) drug therapy: Secondary | ICD-10-CM | POA: Diagnosis not present

## 2019-12-15 DIAGNOSIS — C641 Malignant neoplasm of right kidney, except renal pelvis: Secondary | ICD-10-CM | POA: Diagnosis not present

## 2019-12-15 DIAGNOSIS — D090 Carcinoma in situ of bladder: Secondary | ICD-10-CM | POA: Insufficient documentation

## 2019-12-15 DIAGNOSIS — E7800 Pure hypercholesterolemia, unspecified: Secondary | ICD-10-CM | POA: Insufficient documentation

## 2019-12-15 DIAGNOSIS — Z8546 Personal history of malignant neoplasm of prostate: Secondary | ICD-10-CM | POA: Insufficient documentation

## 2019-12-15 DIAGNOSIS — Z7901 Long term (current) use of anticoagulants: Secondary | ICD-10-CM | POA: Insufficient documentation

## 2019-12-15 DIAGNOSIS — Z87891 Personal history of nicotine dependence: Secondary | ICD-10-CM | POA: Diagnosis not present

## 2019-12-15 DIAGNOSIS — C651 Malignant neoplasm of right renal pelvis: Secondary | ICD-10-CM | POA: Diagnosis not present

## 2019-12-15 DIAGNOSIS — E785 Hyperlipidemia, unspecified: Secondary | ICD-10-CM | POA: Diagnosis not present

## 2019-12-15 DIAGNOSIS — Z7984 Long term (current) use of oral hypoglycemic drugs: Secondary | ICD-10-CM | POA: Insufficient documentation

## 2019-12-15 DIAGNOSIS — I4819 Other persistent atrial fibrillation: Secondary | ICD-10-CM | POA: Insufficient documentation

## 2019-12-15 DIAGNOSIS — R9341 Abnormal radiologic findings on diagnostic imaging of renal pelvis, ureter, or bladder: Secondary | ICD-10-CM | POA: Diagnosis not present

## 2019-12-15 DIAGNOSIS — R310 Gross hematuria: Secondary | ICD-10-CM

## 2019-12-15 DIAGNOSIS — I4891 Unspecified atrial fibrillation: Secondary | ICD-10-CM | POA: Diagnosis not present

## 2019-12-15 HISTORY — DX: Chronic diastolic (congestive) heart failure: I50.32

## 2019-12-15 HISTORY — DX: Hyperlipidemia, unspecified: E78.5

## 2019-12-15 HISTORY — DX: Atherosclerosis of aorta: I70.0

## 2019-12-15 HISTORY — DX: Insomnia, unspecified: G47.00

## 2019-12-15 HISTORY — DX: Frequency of micturition: R35.0

## 2019-12-15 HISTORY — DX: Sciatica, unspecified side: M54.30

## 2019-12-15 HISTORY — PX: CYSTOSCOPY WITH URETEROSCOPY AND STENT PLACEMENT: SHX6377

## 2019-12-15 HISTORY — DX: Anxiety disorder, unspecified: F41.9

## 2019-12-15 HISTORY — DX: Fatty (change of) liver, not elsewhere classified: K76.0

## 2019-12-15 LAB — POCT I-STAT, CHEM 8
BUN: 35 mg/dL — ABNORMAL HIGH (ref 8–23)
Calcium, Ion: 1.28 mmol/L (ref 1.15–1.40)
Chloride: 106 mmol/L (ref 98–111)
Creatinine, Ser: 1.3 mg/dL — ABNORMAL HIGH (ref 0.61–1.24)
Glucose, Bld: 141 mg/dL — ABNORMAL HIGH (ref 70–99)
HCT: 47 % (ref 39.0–52.0)
Hemoglobin: 16 g/dL (ref 13.0–17.0)
Potassium: 4.3 mmol/L (ref 3.5–5.1)
Sodium: 141 mmol/L (ref 135–145)
TCO2: 25 mmol/L (ref 22–32)

## 2019-12-15 LAB — GLUCOSE, CAPILLARY: Glucose-Capillary: 146 mg/dL — ABNORMAL HIGH (ref 70–99)

## 2019-12-15 SURGERY — CYSTOURETEROSCOPY, WITH STENT INSERTION
Anesthesia: General | Site: Ureter | Laterality: Right

## 2019-12-15 MED ORDER — ONDANSETRON HCL 4 MG/2ML IJ SOLN
INTRAMUSCULAR | Status: DC | PRN
  Administered 2019-12-15: 4 mg via INTRAVENOUS

## 2019-12-15 MED ORDER — PROMETHAZINE HCL 25 MG/ML IJ SOLN
6.2500 mg | INTRAMUSCULAR | Status: DC | PRN
  Filled 2019-12-15: qty 1

## 2019-12-15 MED ORDER — FENTANYL CITRATE (PF) 100 MCG/2ML IJ SOLN
INTRAMUSCULAR | Status: DC | PRN
  Administered 2019-12-15: 50 ug via INTRAVENOUS
  Administered 2019-12-15 (×2): 25 ug via INTRAVENOUS

## 2019-12-15 MED ORDER — PHENYLEPHRINE 40 MCG/ML (10ML) SYRINGE FOR IV PUSH (FOR BLOOD PRESSURE SUPPORT)
PREFILLED_SYRINGE | INTRAVENOUS | Status: DC | PRN
  Administered 2019-12-15: 40 ug via INTRAVENOUS

## 2019-12-15 MED ORDER — FENTANYL CITRATE (PF) 100 MCG/2ML IJ SOLN
25.0000 ug | INTRAMUSCULAR | Status: DC | PRN
  Filled 2019-12-15: qty 1

## 2019-12-15 MED ORDER — PROPOFOL 10 MG/ML IV BOLUS
INTRAVENOUS | Status: DC | PRN
  Administered 2019-12-15: 120 mg via INTRAVENOUS

## 2019-12-15 MED ORDER — LIDOCAINE 2% (20 MG/ML) 5 ML SYRINGE
INTRAMUSCULAR | Status: AC
  Filled 2019-12-15: qty 5

## 2019-12-15 MED ORDER — SODIUM CHLORIDE 0.9 % IV SOLN
INTRAVENOUS | Status: DC
  Administered 2019-12-15: 50 mL/h via INTRAVENOUS
  Filled 2019-12-15: qty 1000

## 2019-12-15 MED ORDER — CELECOXIB 200 MG PO CAPS
ORAL_CAPSULE | ORAL | Status: AC
  Filled 2019-12-15: qty 2

## 2019-12-15 MED ORDER — CIPROFLOXACIN IN D5W 400 MG/200ML IV SOLN
INTRAVENOUS | Status: AC
  Filled 2019-12-15: qty 200

## 2019-12-15 MED ORDER — PROPOFOL 10 MG/ML IV BOLUS
INTRAVENOUS | Status: AC
  Filled 2019-12-15: qty 20

## 2019-12-15 MED ORDER — FENTANYL CITRATE (PF) 100 MCG/2ML IJ SOLN
INTRAMUSCULAR | Status: AC
  Filled 2019-12-15: qty 2

## 2019-12-15 MED ORDER — SODIUM CHLORIDE 0.9 % IR SOLN
Status: DC | PRN
  Administered 2019-12-15: 3000 mL

## 2019-12-15 MED ORDER — LIDOCAINE 2% (20 MG/ML) 5 ML SYRINGE
INTRAMUSCULAR | Status: DC | PRN
  Administered 2019-12-15: 80 mg via INTRAVENOUS

## 2019-12-15 MED ORDER — IOHEXOL 300 MG/ML  SOLN
INTRAMUSCULAR | Status: DC | PRN
  Administered 2019-12-15: 50 mL

## 2019-12-15 MED ORDER — PHENYLEPHRINE 40 MCG/ML (10ML) SYRINGE FOR IV PUSH (FOR BLOOD PRESSURE SUPPORT)
PREFILLED_SYRINGE | INTRAVENOUS | Status: AC
  Filled 2019-12-15: qty 10

## 2019-12-15 MED ORDER — CIPROFLOXACIN IN D5W 400 MG/200ML IV SOLN
400.0000 mg | Freq: Once | INTRAVENOUS | Status: AC
  Administered 2019-12-15: 400 mg via INTRAVENOUS
  Filled 2019-12-15: qty 200

## 2019-12-15 MED ORDER — BELLADONNA ALKALOIDS-OPIUM 16.2-60 MG RE SUPP
RECTAL | Status: AC
  Filled 2019-12-15: qty 1

## 2019-12-15 MED ORDER — DEXAMETHASONE SODIUM PHOSPHATE 10 MG/ML IJ SOLN
INTRAMUSCULAR | Status: DC | PRN
  Administered 2019-12-15: 6 mg via INTRAVENOUS

## 2019-12-15 MED ORDER — ONDANSETRON HCL 4 MG/2ML IJ SOLN
INTRAMUSCULAR | Status: AC
  Filled 2019-12-15: qty 2

## 2019-12-15 MED ORDER — PHENAZOPYRIDINE HCL 200 MG PO TABS: 200.0000 mg | ORAL_TABLET | Freq: Three times a day (TID) | ORAL | 0 refills | Status: DC | PRN

## 2019-12-15 MED ORDER — DEXAMETHASONE SODIUM PHOSPHATE 10 MG/ML IJ SOLN
INTRAMUSCULAR | Status: AC
  Filled 2019-12-15: qty 1

## 2019-12-15 MED ORDER — CELECOXIB 400 MG PO CAPS
400.0000 mg | ORAL_CAPSULE | Freq: Once | ORAL | Status: AC
  Administered 2019-12-15: 400 mg via ORAL
  Filled 2019-12-15: qty 1

## 2019-12-15 MED ORDER — ACETAMINOPHEN 500 MG PO TABS
ORAL_TABLET | ORAL | Status: AC
  Filled 2019-12-15: qty 2

## 2019-12-15 MED ORDER — ACETAMINOPHEN 500 MG PO TABS
1000.0000 mg | ORAL_TABLET | Freq: Once | ORAL | Status: AC
  Administered 2019-12-15: 1000 mg via ORAL
  Filled 2019-12-15: qty 2

## 2019-12-15 SURGICAL SUPPLY — 24 items
BAG DRAIN URO-CYSTO SKYTR STRL (DRAIN) ×2 IMPLANT
BASKET ZERO TIP NITINOL 2.4FR (BASKET) IMPLANT
BRUSH URET BIOPSY 3F (UROLOGICAL SUPPLIES) ×2 IMPLANT
CATH INTERMIT  6FR 70CM (CATHETERS) IMPLANT
CATH URET 5FR 28IN CONE TIP (BALLOONS)
CATH URET 5FR 70CM CONE TIP (BALLOONS) IMPLANT
CLOTH BEACON ORANGE TIMEOUT ST (SAFETY) ×2 IMPLANT
DRSG TELFA 3X8 NADH (GAUZE/BANDAGES/DRESSINGS) ×2 IMPLANT
EXTRACTOR STONE 1.7FRX115CM (UROLOGICAL SUPPLIES) IMPLANT
FIBER LASER FLEXIVA 365 (UROLOGICAL SUPPLIES) IMPLANT
FIBER LASER TRAC TIP (UROLOGICAL SUPPLIES) IMPLANT
GLOVE BIO SURGEON STRL SZ8 (GLOVE) ×2 IMPLANT
GOWN STRL REUS W/TWL XL LVL3 (GOWN DISPOSABLE) ×2 IMPLANT
GUIDEWIRE ANG ZIPWIRE 038X150 (WIRE) IMPLANT
GUIDEWIRE STR DUAL SENSOR (WIRE) ×2 IMPLANT
IV NS IRRIG 3000ML ARTHROMATIC (IV SOLUTION) ×4 IMPLANT
KIT TURNOVER CYSTO (KITS) ×2 IMPLANT
MANIFOLD NEPTUNE II (INSTRUMENTS) IMPLANT
NS IRRIG 500ML POUR BTL (IV SOLUTION) ×2 IMPLANT
PACK CYSTO (CUSTOM PROCEDURE TRAY) ×2 IMPLANT
SHEATH URETERAL 12FRX35CM (MISCELLANEOUS) ×2 IMPLANT
TUBE CONNECTING 12X1/4 (SUCTIONS) IMPLANT
TUBING UROLOGY SET (TUBING) ×2 IMPLANT
WATER STERILE IRR 3000ML UROMA (IV SOLUTION) IMPLANT

## 2019-12-15 NOTE — Anesthesia Postprocedure Evaluation (Signed)
Anesthesia Post Note  Patient: Adam Harris  Procedure(s) Performed: CYSTOSCOPY WITH RIGHT RETROGRADE/ RIGHT URETEROSCOPY/ BIOPSY (Right Ureter)     Patient location during evaluation: PACU Anesthesia Type: General Level of consciousness: awake and alert Pain management: pain level controlled Vital Signs Assessment: post-procedure vital signs reviewed and stable Respiratory status: spontaneous breathing, nonlabored ventilation, respiratory function stable and patient connected to nasal cannula oxygen Cardiovascular status: blood pressure returned to baseline and stable Postop Assessment: no apparent nausea or vomiting Anesthetic complications: no    Last Vitals:  Vitals:   12/15/19 0915 12/15/19 0930  BP: (!) 137/94 (!) 143/90  Pulse: 72 70  Resp: 11 16  Temp:    SpO2: 95% 96%    Last Pain:  Vitals:   12/15/19 0938  TempSrc:   PainSc: 0-No pain                 Adam Harris DANIEL

## 2019-12-15 NOTE — Anesthesia Procedure Notes (Signed)
Procedure Name: LMA Insertion Date/Time: 12/15/2019 7:35 AM Performed by: Victoriano Lain, CRNA Pre-anesthesia Checklist: Patient identified, Emergency Drugs available, Suction available, Patient being monitored and Timeout performed Patient Re-evaluated:Patient Re-evaluated prior to induction Oxygen Delivery Method: Circle system utilized Preoxygenation: Pre-oxygenation with 100% oxygen Induction Type: IV induction LMA: LMA with gastric port inserted LMA Size: 4.0 Number of attempts: 1 Placement Confirmation: positive ETCO2 and breath sounds checked- equal and bilateral Tube secured with: Tape Dental Injury: Teeth and Oropharynx as per pre-operative assessment

## 2019-12-15 NOTE — Op Note (Signed)
PATIENT:  Adam Harris  PRE-OPERATIVE DIAGNOSIS: 1.  Intermittent gross hematuria. 2.  Abnormal appearing renal pelvic region on CT scan.  POST-OPERATIVE DIAGNOSIS: Same  PROCEDURE: 1.  Cystoscopy with right retrograde pyelogram including interpretation. 2.  Bladder biopsy. 3.  Right ureteroscopy with ureteral biopsies.  SURGEON:  Claybon Jabs  INDICATION: Adam Harris is a 74 year old male who has been experiencing intermittent gross hematuria.  He was evaluated for this with cystoscopy and retrograde pyelograms as well as ureteroscopy and biopsies in 5/19 with no abnormality identified pathologically.  He continued to have intermittent gross hematuria.  A repeat CT scan revealed what appeared to be some thickening of the renal pelvis region but no definite mass or filling defect was seen.  The left side was noted to be normal in appearance.  He is therefore brought back to the operating room today for repeat cystoscopy and evaluation of his collecting system on the right-hand side.  ANESTHESIA:  General  EBL:  Minimal  DRAINS: None  LOCAL MEDICATIONS USED:  None  SPECIMEN: 1.  Right renal pelvis biopsies. 2.  Brush biopsy of right renal pelvis. 3.  Barbotage from right renal pelvis. 4.  Bladder biopsies.  Findings: - There were some slightly red areas on the posterior wall the bladder without any papillary lesion associated however because of his history of hematuria these were biopsied. - The entire ureter was noted to be normal on retrograde pyelogram with no definite filling defects or significant abnormality identified in the area of the renal pelvis. - The ureter was noted to be entirely normal throughout its length on ureteroscopy.  The renal pelvis was also noted to be completely normal in appearance with normal-appearing mucosa without any evidence of erythema or irregularity of the mucosa identifiable.  Each calyx was identified and visualized with no definite abnormality noted  either.  Description of procedure: After informed consent the patient was taken to the operating room and placed on the table in a supine position. General anesthesia was then administered. Once fully anesthetized the patient was moved to the dorsal lithotomy position and the genitalia were sterilely prepped and draped in standard fashion. An official timeout was then performed.  The [employer] sounds were used to dilate the urethra distally up to 58 [place] and then the 23 [place] rigid cystoscope with 30 degree lens was passed under direct vision down the urethra which was noted to be entirely normal.  The prostatic urethra revealed bilobar hypertrophy but had no lesions.  Upon entering the bladder I noted 1+ trabeculation.  The right and left ureteral orifice were of normal configuration and position.  There were 2 areas of slightly reddened mucosa on the posterior wall just to the right of midline with no definite papillary lesions identified within the bladder upon full inspection.  No other worrisome erythematous areas were identified either.  A 6 French open-ended ureteral catheter was then passed through the cystoscope and into the right ureteral orifice and full-strength Omnipaque contrast was injected under direct fluoroscopy to perform a right retrograde pyelogram.  This revealed a normal ureter throughout its entire length with no filling defects or mass-effect.  The intrarenal collecting system also appeared to be free of any mass or definite abnormality.  I therefore passed a 0.038 inch sensor guidewire through the open-ended catheter into the area the renal pelvis and remove the open-ended catheter and cystoscope.  I passed the inner portion of ureteral access sheath over the guidewire to dilate the  intramural ureter and then proceeded with ureteroscopy.  A 6 French rigid ureteroscope was then passed under direct vision into the bladder and into the right ureteral orifice.  I was able to pass  this up the right ureter without difficulty under direct vision and noted no abnormalities of the ureter.  Upon entering the renal pelvis my visualization was somewhat limited due to the fact that it was a rigid scope but I found no lesions or worrisome areas.  Because of somewhat limited visualization I therefore elected to proceed with flexible ureteroscopy and therefore removed the rigid scope and passed the flexible ureteroscope over the guidewire into the area of the renal pelvis and remove the guidewire.  I then did a full and systematic inspection of each calyx and found no lesions.  I spent a great deal of time in the area of the renal pelvis and could find no abnormality of the mucosa.  It appeared entirely normal with no irregularities of the mucosal lining, no papillary lesions and no erythematous patches.  The UPJ appeared normal and the proximal ureter also was visualized and noted to be entirely normal.  I first used the biopsy forceps that were passed through the flexible ureteroscope and obtained minute biopsies from several areas of the renal pelvis.  I then performed barbotage and collected this for cytology.  I next passed a brush biopsy through the ureteroscope and brushed the renal pelvis in several locations.  This was then removed and included in the cytology specimen after which I then performed barbotage a second time after the brushing had occurred and included this in the cytology specimen.  There was no significant bleeding occurring and therefore I backed the ureteroscope down the ureter under direct visualization and noted the ureter was entirely normal throughout its length.  I reinserted the rigid cystoscope and used the cold cup biopsy forceps to obtain biopsies from the red areas on the posterior wall of the bladder.  These were then fulgurated with a Bugbee electrode and the bladder was drained, the cystoscope was removed and the patient was awakened and taken to the recovery  room in stable and satisfactory condition.  He tolerated the procedure well with no intraoperative complications.  PLAN OF CARE: Discharge to home after PACU  PATIENT DISPOSITION:  PACU - hemodynamically stable.

## 2019-12-15 NOTE — Discharge Instructions (Addendum)

## 2019-12-15 NOTE — Transfer of Care (Signed)
Immediate Anesthesia Transfer of Care Note  Patient: Adam Harris  Procedure(s) Performed: CYSTOSCOPY WITH RIGHT RETROGRADE/ RIGHT URETEROSCOPY/ BIOPSY (Right Ureter)  Patient Location: PACU  Anesthesia Type:General  Level of Consciousness: awake, alert , oriented and patient cooperative  Airway & Oxygen Therapy: Patient Spontanous Breathing and Patient connected to face mask oxygen  Post-op Assessment: Report given to RN, Post -op Vital signs reviewed and stable and Patient moving all extremities  Post vital signs: Reviewed and stable  Last Vitals:  Vitals Value Taken Time  BP 163/104 12/15/19 0855  Temp    Pulse 40 12/15/19 0858  Resp 16 12/15/19 0858  SpO2 100 % 12/15/19 0858  Vitals shown include unvalidated device data.  Last Pain:  Vitals:   12/15/19 0621  TempSrc: Oral  PainSc: 0-No pain      Patients Stated Pain Goal: 5 (40/08/67 6195)  Complications: No apparent anesthesia complications

## 2019-12-16 LAB — SURGICAL PATHOLOGY

## 2019-12-16 LAB — CYTOLOGY - NON PAP

## 2019-12-23 DIAGNOSIS — C651 Malignant neoplasm of right renal pelvis: Secondary | ICD-10-CM | POA: Diagnosis not present

## 2019-12-23 DIAGNOSIS — D090 Carcinoma in situ of bladder: Secondary | ICD-10-CM | POA: Diagnosis not present

## 2020-01-06 DIAGNOSIS — C651 Malignant neoplasm of right renal pelvis: Secondary | ICD-10-CM | POA: Diagnosis not present

## 2020-01-06 DIAGNOSIS — C61 Malignant neoplasm of prostate: Secondary | ICD-10-CM | POA: Diagnosis not present

## 2020-01-06 DIAGNOSIS — D381 Neoplasm of uncertain behavior of trachea, bronchus and lung: Secondary | ICD-10-CM | POA: Diagnosis not present

## 2020-01-06 DIAGNOSIS — D090 Carcinoma in situ of bladder: Secondary | ICD-10-CM | POA: Diagnosis not present

## 2020-01-07 LAB — BASIC METABOLIC PANEL
CO2: 32 — AB (ref 13–22)
Chloride: 105 (ref 99–108)
Creatinine: 1.3 (ref 0.6–1.3)
Glucose: 105
Sodium: 141 (ref 137–147)

## 2020-01-07 LAB — HEPATIC FUNCTION PANEL
ALT: 9 — AB (ref 10–40)
AST: 14 (ref 14–40)
Bilirubin, Total: 2.4

## 2020-01-07 LAB — COMPREHENSIVE METABOLIC PANEL
Albumin: 4.2 (ref 3.5–5.0)
Calcium: 9.9 (ref 8.7–10.7)
GFR calc Af Amer: 62.7
GFR calc non Af Amer: 54.1

## 2020-01-14 DIAGNOSIS — D090 Carcinoma in situ of bladder: Secondary | ICD-10-CM | POA: Diagnosis not present

## 2020-01-14 DIAGNOSIS — R911 Solitary pulmonary nodule: Secondary | ICD-10-CM | POA: Diagnosis not present

## 2020-01-14 DIAGNOSIS — R310 Gross hematuria: Secondary | ICD-10-CM | POA: Diagnosis not present

## 2020-01-14 DIAGNOSIS — C651 Malignant neoplasm of right renal pelvis: Secondary | ICD-10-CM | POA: Diagnosis not present

## 2020-01-14 DIAGNOSIS — C61 Malignant neoplasm of prostate: Secondary | ICD-10-CM | POA: Diagnosis not present

## 2020-01-14 DIAGNOSIS — C659 Malignant neoplasm of unspecified renal pelvis: Secondary | ICD-10-CM | POA: Diagnosis not present

## 2020-01-14 DIAGNOSIS — C679 Malignant neoplasm of bladder, unspecified: Secondary | ICD-10-CM | POA: Diagnosis not present

## 2020-01-14 DIAGNOSIS — Z5111 Encounter for antineoplastic chemotherapy: Secondary | ICD-10-CM | POA: Diagnosis not present

## 2020-01-21 DIAGNOSIS — D090 Carcinoma in situ of bladder: Secondary | ICD-10-CM | POA: Diagnosis not present

## 2020-01-21 DIAGNOSIS — Z5111 Encounter for antineoplastic chemotherapy: Secondary | ICD-10-CM | POA: Diagnosis not present

## 2020-01-26 ENCOUNTER — Other Ambulatory Visit: Payer: Self-pay | Admitting: Cardiology

## 2020-01-26 ENCOUNTER — Other Ambulatory Visit: Payer: Self-pay | Admitting: Physician Assistant

## 2020-01-26 NOTE — Telephone Encounter (Signed)
Rx request sent to pharmacy.  

## 2020-01-28 DIAGNOSIS — D090 Carcinoma in situ of bladder: Secondary | ICD-10-CM | POA: Diagnosis not present

## 2020-01-28 DIAGNOSIS — Z5111 Encounter for antineoplastic chemotherapy: Secondary | ICD-10-CM | POA: Diagnosis not present

## 2020-02-04 ENCOUNTER — Ambulatory Visit: Payer: Medicare Other | Admitting: *Deleted

## 2020-02-04 DIAGNOSIS — D090 Carcinoma in situ of bladder: Secondary | ICD-10-CM | POA: Diagnosis not present

## 2020-02-04 DIAGNOSIS — Z5111 Encounter for antineoplastic chemotherapy: Secondary | ICD-10-CM | POA: Diagnosis not present

## 2020-02-11 DIAGNOSIS — D090 Carcinoma in situ of bladder: Secondary | ICD-10-CM | POA: Diagnosis not present

## 2020-02-11 DIAGNOSIS — Z5111 Encounter for antineoplastic chemotherapy: Secondary | ICD-10-CM | POA: Diagnosis not present

## 2020-02-18 DIAGNOSIS — Z5111 Encounter for antineoplastic chemotherapy: Secondary | ICD-10-CM | POA: Diagnosis not present

## 2020-02-18 DIAGNOSIS — D090 Carcinoma in situ of bladder: Secondary | ICD-10-CM | POA: Diagnosis not present

## 2020-02-26 ENCOUNTER — Other Ambulatory Visit: Payer: Self-pay | Admitting: Cardiology

## 2020-02-27 NOTE — Telephone Encounter (Signed)
74 M 97.5 kg, SCr 1.6 (2/21), LOV 1/21 Hochrein

## 2020-03-01 ENCOUNTER — Telehealth: Payer: Self-pay | Admitting: Cardiology

## 2020-03-01 NOTE — Telephone Encounter (Signed)
      [facility] Pre-operative Risk Assessment    Request for surgical clearance:  1. What type of surgery is being performed? Right robot assisted laparoscopic nephroureterectomy   2. When is this surgery scheduled? TBD   3. What type of clearance is required (medical clearance vs. Pharmacy clearance to hold med vs. Both)? Both  4. Are there any medications that need to be held prior to surgery and how long? Hold Eliquis 48-72 hours prior   5. Practice name and name of physician performing surgery? Dr. [name]   6. What is your office phone number [phone] ext [phone]    7.   What is your office fax number [phone]  8.   Anesthesia type (None, local, MAC, general) ? General    Angeline S Hammer 03/01/2020, 2:41 PM  _________________________________________________________________   (provider comments below)

## 2020-03-01 NOTE — Progress Notes (Deleted)
Subjective:   Adam Harris is a 74 y.o. male who presents for Medicare Annual/Subsequent preventive examination.  Review of Systems:   Home Safety/Smoke Alarms: Feels safe in home. Smoke alarms in place.    Male:   CCS-     PSA-  Lab Results  Component Value Date   PSA 1.67 01/08/2018   PSA 10.8 07/11/2017   PSA 2.69 12/24/2014       Objective:    Vitals: There were no vitals taken for this visit.  There is no height or weight on file to calculate BMI.  Advanced Directives 12/15/2019 12/08/2019 02/25/2019 07/07/2018 03/18/2018 11/20/2016 02/03/2014  Does Patient Have a Medical Advance Directive? No No No No No No Patient does not have advance directive;Patient would not like information  Would patient like information on creating a medical advance directive? Yes (MAU/Ambulatory/Procedural Areas - Information given) Yes (ED - Information included in AVS) - - No - Patient declined No - Patient declined -  Pre-existing out of facility DNR order (yellow form or pink MOST form) - - - - - - No    Tobacco Social History   Tobacco Use  Smoking Status Never Smoker  Smokeless Tobacco Former Systems developer   Types: Chew     Counseling given: Not Answered   Clinical Intake:                       Past Medical History:  Diagnosis Date   Abnormal radiologic findings on diagnostic imaging of renal pelvis, ureter, or bladder    bilateral ureter abnormalities   Anticoagulant long-term use    eliquis   Anxiety    Arthritis    CAD (coronary artery disease) cardiologist-- dr hochrein   NSTEMI 02-04-2014  per cardiac cath chronic occluded RCA w/ faint left-to-right collaterals and aneurysmal LCFx with sluggish coronary flow/   NSTEMI --11-21-2016 per cardiac cath occluded proximal RCA & mid to diastal CFX 100%, med rx. If that does not work, PTCA or CABG   Chronic diastolic heart failure ([place])    CKD (chronic kidney disease), stage III    patient unaware   DOE (dyspnea on  exertion)    2-3 flights of stairs   Fatty liver    pt denies   Hematuria    Hematuria 02/2019   History of COVID-19 10/2019   History of non-ST elevation myocardial infarction (NSTEMI)    02-04-2014  and 11-21-2016  cardiac cath done both times ,  medically management   History of shingles 12/2017   slight pain and numbness still noted in the area   Hyperlipidemia    Hypertension    Insomnia    Myocardial infarction [facility]) 2015   Persistent atrial fibrillation [facility])    cardiologsit-- dr hochrein   Sciatica    Thoracic aortic atherosclerosis ([place])    Type 2 diabetes mellitus ([place])    Urinary frequency    Past Surgical History:  Procedure Laterality Date   CARDIAC CATHETERIZATION N/A 11/21/2016   Procedure: Left Heart Cath and Coronary Angiography;  Surgeon: Troy Sine, MD;  Location: [place] [facility];  Service: Cardiovascular;  Laterality: N/A;  pRCA 100% , ostial LAD 45%, OM3 80%, mCFX to dCFX 100% (AV groove), lateral OM3 50%   COLONOSCOPY     CYSTOSCOPY WITH URETEROSCOPY AND STENT PLACEMENT Right 12/15/2019   Procedure: CYSTOSCOPY WITH RIGHT RETROGRADE/ RIGHT URETEROSCOPY/ BIOPSY;  Surgeon: [name], MD;  Location: [place];  Service:  Urology;  Laterality: Right;   CYSTOSCOPY/RETROGRADE/URETEROSCOPY Bilateral 03/18/2018   Procedure: CYSTOSCOPY/RETROGRADE/URETEROSCOPY.;  Surgeon: [name], MD;  Location[facility];  Service: Urology;  Laterality: Bilateral;   KNEE ARTHROSCOPY Right    LEFT HEART CATHETERIZATION WITH CORONARY ANGIOGRAM N/A 02/04/2014   Procedure: LEFT HEART CATHETERIZATION WITH CORONARY ANGIOGRAM;  Surgeon: [name], MD;  Location: [place] [facility];  Service: Cardiovascular;  Laterality: N/A;  severe one-vessel CAD, chronically occluded RCA with faint left-to-right collaterals;  aneurysmal LCFx with sluggish coronary flow;  normal LVSF w/ moderately elevated LVEDP (ostialOM2 20%, pOM3 20%, pD1 20%,  mCFX 50%, diffuse 20% pCFX)   TRANSTHORACIC ECHOCARDIOGRAM  11-22-2016   dr hochrein   ef 55-60%/ mild MR and TR/ moderate LAE   Family History  Problem Relation Age of Onset   Hypertension Mother    Social History   Socioeconomic History   Marital status: Married    Spouse name: Not on file   Number of children: Not on file   Years of education: Not on file   Highest education level: Not on file  Occupational History   Not on file  Tobacco Use   Smoking status: Never Smoker   Smokeless tobacco: Former Systems developer    Types: Chew  Substance and Sexual Activity   Alcohol use: No   Drug use: No   Sexual activity: Not on file  Other Topics Concern   Not on file  Social History Narrative   Not on file   Social Determinants of Health   Financial Resource Strain:    Difficulty of Paying Living Expenses:   Food Insecurity:    Worried About Charity fundraiser in the Last Year:    Arboriculturist in the Last Year:   Transportation Needs:    Film/video editor (Medical):    Lack of Transportation (Non-Medical):   Physical Activity:    Days of Exercise per Week:    Minutes of Exercise per Session:   Stress:    Feeling of Stress :   Social Connections:    Frequency of Communication with Friends and Family:    Frequency of Social Gatherings with Friends and Family:    Attends Religious Services:    Active Member of Clubs or Organizations:    Attends Archivist Meetings:    Marital Status:     Outpatient Encounter Medications as of 03/03/2020  Medication Sig   diphenhydramine-acetaminophen (TYLENOL PM) 25-500 MG TABS tablet Take 1 tablet by mouth at bedtime as needed.   ELIQUIS 5 MG TABS tablet TAKE ONE TABLET BY MOUTH TWICE A DAY   furosemide (LASIX) 20 MG tablet TAKE ONE TABLET BY MOUTH DAILY . MAY TAKE EXTRA TABLET IF NEEDED FOR SWELLING (Patient taking differently: as needed. )   isosorbide mononitrate (IMDUR) 30 MG 24 hr tablet  TAKE ONE TABLET BY MOUTH DAILY (Patient taking differently: every morning. )   metoprolol tartrate (LOPRESSOR) 50 MG tablet Take 1 tablet (50 mg total) by mouth 2 (two) times daily.   nitroGLYCERIN (NITROSTAT) 0.4 MG SL tablet PLACE ONE TABLET UNDER THE TONGUE EVERY 5 MINUTES TIMES THREE DOSES AS NEEDED FOR CHEST PAIN. CALL 911 IF 2ND DOSE DOES NOT HELP   Olmesartan-amLODIPine-HCTZ 40-10-12.5 MG TABS TAKE HALF TABLET BY MOUTH DAILY   phenazopyridine (PYRIDIUM) 200 MG tablet Take 1 tablet (200 mg total) by mouth 3 (three) times daily as needed for pain.   rosuvastatin (CRESTOR) 40 MG tablet Take 1 tablet (  40 mg total) by mouth every morning.   sitaGLIPtin (JANUVIA) 100 MG tablet Take 1 tablet (100 mg total) by mouth daily.   No facility-administered encounter medications on file as of 03/03/2020.    Activities of Daily Living In your present state of health, do you have any difficulty performing the following activities: 12/15/2019 12/15/2019  Hearing? - N  Vision? - N  Difficulty concentrating or making decisions? - N  Walking or climbing stairs? - N  Dressing or bathing? - N  Doing errands, shopping? N -  Some recent data might be hidden    Patient Care Team: Ronnald Nian, DO as PCP - General (Family Medicine) Minus Breeding, MD as PCP - Cardiology (Cardiology)   Assessment:   This is a routine wellness examination for Ehan. Physical assessment deferred to PCP.  Exercise Activities and Dietary recommendations   Diet (meal preparation, eat out, water intake, caffeinated beverages, dairy products, fruits and vegetables): {Desc; diets:16563} Breakfast: Lunch:  Dinner:      Goals   None     Fall Risk Fall Risk  08/23/2018 05/22/2016  Falls in the past year? Yes No  Number falls in past yr: 1 -     Depression Screen PHQ 2/9 Scores 11/27/2019 08/23/2018 05/22/2016  PHQ - 2 Score 0 0 0    Cognitive Function   Ad8 score reviewed for issues:  Issues making  decisions:  Less interest in hobbies / activities:  Repeats questions, stories (family complaining):  Trouble using ordinary gadgets (microwave, computer, phone):  Forgets the month or year:   Mismanaging finances:   Remembering appts:  Daily problems with thinking and/or memory: Ad8 score is=          Immunization History  Administered Date(s) Administered   DTaP 12/24/2014   Fluad Quad(high Dose 65+) 09/04/2019   Influenza, High Dose Seasonal PF 08/23/2018   Influenza,inj,Quad PF,6+ Mos 07/27/2014   Influenza-Unspecified 07/27/2014   Pneumococcal Conjugate-13 05/22/2016   Pneumococcal Polysaccharide-23 02/03/2014, 12/24/2014   Tdap 02/03/2014, 12/24/2014, 07/07/2018   Screening Tests Health Maintenance  Topic Date Due   OPHTHALMOLOGY EXAM  Never done   COVID-19 Vaccine (1) Never done   COLONOSCOPY  Never done   FOOT EXAM  05/22/2017   HEMOGLOBIN A1C  05/26/2020   INFLUENZA VACCINE  06/13/2020   TETANUS/TDAP  07/07/2028   Hepatitis C Screening  Completed   PNA vac Low Risk Adult  Completed     Plan:   ***  I have personally reviewed and noted the following in the patients chart:    Medical and social history  Use of alcohol, tobacco or illicit drugs   Current medications and supplements  Functional ability and status  Nutritional status  Physical activity  Advanced directives  List of other physicians  Hospitalizations, surgeries, and ER visits in previous 12 months  Vitals  Screenings to include cognitive, depression, and falls  Referrals and appointments  In addition, I have reviewed and discussed with patient certain preventive protocols, quality metrics, and best practice recommendations. A written personalized care plan for preventive services as well as general preventive health recommendations were provided to patient.     Shela Nevin, [place]  03/01/2020

## 2020-03-02 NOTE — Telephone Encounter (Signed)
Patient with diagnosis of afib on Eliquis for anticoagulation.    Procedure: Right robot assisted laparoscopic nephroureterectomy  Date of procedure: TBD  CHADS2-VASc score of 5 (age, CHF, HTN, DM, CAD)  CrCl 68 Platelet count 196  Ok to hold Eliquis for 2-3 days prior to procedure as requested.

## 2020-03-02 NOTE — Telephone Encounter (Signed)
   Primary Cardiologist: Minus Breeding, MD  Chart reviewed as part of pre-operative protocol coverage. Given past medical history and time since last visit, based on ACC/AHA guidelines, Adam Harris would be at acceptable risk for the planned procedure without further cardiovascular testing.   Patient with diagnosis of afib on Eliquis for anticoagulation.    Procedure: Right robot assisted laparoscopic nephroureterectomy Date of procedure: TBD  CHADS2-VASc score of 5 (age, CHF, HTN, DM, CAD)  CrCl 68 Platelet count 196  Ok to hold Eliquis for 2-3 days prior to procedure as requested.   I will route this recommendation to the requesting party via Epic fax function and remove from pre-op pool.  Please call with questions.  Jossie Ng. [facility] [place] [address] Office [phone] Fax [phone]

## 2020-03-03 ENCOUNTER — Ambulatory Visit: Payer: Medicare Other | Admitting: *Deleted

## 2020-03-05 DIAGNOSIS — D090 Carcinoma in situ of bladder: Secondary | ICD-10-CM | POA: Diagnosis not present

## 2020-03-05 DIAGNOSIS — C651 Malignant neoplasm of right renal pelvis: Secondary | ICD-10-CM | POA: Diagnosis not present

## 2020-03-08 ENCOUNTER — Encounter: Payer: Self-pay | Admitting: Family Medicine

## 2020-03-08 ENCOUNTER — Other Ambulatory Visit: Payer: Self-pay | Admitting: Urology

## 2020-03-08 MED ORDER — FLEET ENEMA 7-19 GM/118ML RE ENEM: 1.0000 | ENEMA | Freq: Once | RECTAL | Status: DC

## 2020-03-10 NOTE — Patient Instructions (Signed)
DUE TO COVID-19 ONLY ONE VISITOR IS ALLOWED TO COME WITH YOU AND STAY IN THE WAITING ROOM ONLY DURING PRE OP AND PROCEDURE DAY OF SURGERY. THE 2 VISITORS  MAY VISIT WITH YOU AFTER SURGERY IN YOUR PRIVATE ROOM DURING VISITING HOURS ONLY!  YOU NEED TO HAVE A COVID 19 TEST ON_5/6/21______ @__1 :30 p_____, THIS TEST MUST BE DONE BEFORE SURGERY, COME  [place] , [id number].  ([place]) ONCE YOUR COVID TEST IS COMPLETED, PLEASE BEGIN THE QUARANTINE INSTRUCTIONS AS OUTLINED IN YOUR HANDOUT.                Adam Harris   Your procedure is scheduled on: 03/22/20   Report to [facility] Main  Entrance   Report to Short Stay at 5:30 AM     Call this number if you have problems the morning of surgery [phone]    Remember: Do not eat food or drink liquids :After Midnight.   BRUSH YOUR TEETH MORNING OF SURGERY AND RINSE YOUR MOUTH OUT, NO CHEWING GUM CANDY OR MINTS.     Take these medicines the morning of surgery with A SIP OF WATER: Isosorbide., Metoprolol  DO NOT TAKE ANY DIABETIC MEDICATIONS DAY OF YOUR SURGERY   How to Manage Your Diabetes Before and After Surgery  Why is it important to control my blood sugar before and after surgery? . Improving blood sugar levels before and after surgery helps healing and can limit problems. . A way of improving blood sugar control is eating a healthy diet by: o  Eating less sugar and carbohydrates o  Increasing activity/exercise o  Talking with your doctor about reaching your blood sugar goals . High blood sugars (greater than 180 mg/dL) can raise your risk of infections and slow your recovery, so you will need to focus on controlling your diabetes during the weeks before surgery. . Make sure that the doctor who takes care of your diabetes knows about your planned surgery including the date and location.  How do I manage my blood sugar before surgery? . Check your blood sugar at least 4 times a day,  starting 2 days before surgery, to make sure that the level is not too high or low. o Check your blood sugar the morning of your surgery when you wake up and every 2 hours until you get to the Short Stay unit. . If your blood sugar is less than 70 mg/dL, you will need to treat for low blood sugar: o Do not take insulin. o Treat a low blood sugar (less than 70 mg/dL) with  cup of clear juice (cranberry or apple), 4 glucose tablets, OR glucose gel. o Recheck blood sugar in 15 minutes after treatment (to make sure it is greater than 70 mg/dL). If your blood sugar is not greater than 70 mg/dL on recheck, call [phone] for further instructions. . Report your blood sugar to the short stay nurse when you get to Short Stay.  . If you are admitted to the hospital after surgery: o Your blood sugar will be checked by the staff and you will probably be given insulin after surgery (instead of oral diabetes medicines) to make sure you have good blood sugar levels. o The goal for blood sugar control after surgery is 80-180 mg/dL.  WHAT DO I DO ABOUT MY DIABETES MEDICATION?  Marland Kitchen Do not take oral diabetes medicines (pills) the morning of surgery.  You may not have any metal on your body including              piercings  Do not wear jewelry,  lotions, powders or  deodorant                        Men may shave face and neck.   Do not bring valuables to the hospital. [place].  Contacts, dentures or bridgework may not be worn into surgery.      Special Instructions: N/A              Please read over the following fact sheets you were given: _____________________________________________________________________             [facility] - Preparing for Surgery Before surgery, you can play an important role.   Because skin is not sterile, your skin needs to be as free of germs as possible.   You can reduce the number of germs on your skin  by washing with CHG (chlorahexidine gluconate) soap before surgery.   CHG is an antiseptic cleaner which kills germs and bonds with the skin to continue killing germs even after washing. Please DO NOT use if you have an allergy to CHG or antibacterial soaps.   If your skin becomes reddened/irritated stop using the CHG and inform your nurse when you arrive at Short Stay.   You may shave your face/neck.  Please follow these instructions carefully:  1.  Shower with CHG Soap the night before surgery and the  morning of Surgery.  2.  If you choose to wash your hair, wash your hair first as usual with your  normal  shampoo.  3.  After you shampoo, rinse your hair and body thoroughly to remove the  shampoo.                                        4.  Use CHG as you would any other liquid soap.  You can apply chg directly  to the skin and wash                       Gently with a scrungie or clean washcloth.  5.  Apply the CHG Soap to your body ONLY FROM THE NECK DOWN.   Do not use on face/ open                           Wound or open sores. Avoid contact with eyes, ears mouth and genitals (private parts).                       Wash face,  Genitals (private parts) with your normal soap.             6.  Wash thoroughly, paying special attention to the area where your surgery  will be performed.  7.  Thoroughly rinse your body with warm water from the neck down.  8.  DO NOT shower/wash with your normal soap after using and rinsing off  the CHG Soap.             9.  Pat yourself dry with a clean towel.  10.  Wear clean pajamas.            11.  Place clean sheets on your bed the night of your first shower and do not  sleep with pets. Day of Surgery : Do not apply any lotions/deodorants the morning of surgery.  Please wear clean clothes to the hospital/surgery center.  FAILURE TO FOLLOW THESE INSTRUCTIONS MAY RESULT IN THE CANCELLATION OF YOUR SURGERY PATIENT  SIGNATURE_________________________________  NURSE SIGNATURE__________________________________  ________________________________________________________________________

## 2020-03-11 ENCOUNTER — Other Ambulatory Visit: Payer: Self-pay

## 2020-03-11 ENCOUNTER — Encounter (HOSPITAL_COMMUNITY): Payer: Self-pay

## 2020-03-11 ENCOUNTER — Encounter (HOSPITAL_COMMUNITY)
Admission: RE | Admit: 2020-03-11 | Discharge: 2020-03-11 | Disposition: A | Discharge status: Home / Self Care | Payer: Medicare Other | Source: Ambulatory Visit | Attending: Urology | Admitting: Urology

## 2020-03-11 DIAGNOSIS — Z79899 Other long term (current) drug therapy: Secondary | ICD-10-CM | POA: Insufficient documentation

## 2020-03-11 DIAGNOSIS — Z8616 Personal history of COVID-19: Secondary | ICD-10-CM | POA: Insufficient documentation

## 2020-03-11 DIAGNOSIS — E118 Type 2 diabetes mellitus with unspecified complications: Secondary | ICD-10-CM | POA: Diagnosis not present

## 2020-03-11 DIAGNOSIS — Z01812 Encounter for preprocedural laboratory examination: Secondary | ICD-10-CM | POA: Diagnosis not present

## 2020-03-11 DIAGNOSIS — Z7901 Long term (current) use of anticoagulants: Secondary | ICD-10-CM | POA: Insufficient documentation

## 2020-03-11 DIAGNOSIS — I2510 Atherosclerotic heart disease of native coronary artery without angina pectoris: Secondary | ICD-10-CM | POA: Insufficient documentation

## 2020-03-11 DIAGNOSIS — E785 Hyperlipidemia, unspecified: Secondary | ICD-10-CM | POA: Diagnosis not present

## 2020-03-11 DIAGNOSIS — C7919 Secondary malignant neoplasm of other urinary organs: Secondary | ICD-10-CM | POA: Insufficient documentation

## 2020-03-11 DIAGNOSIS — C651 Malignant neoplasm of right renal pelvis: Secondary | ICD-10-CM | POA: Diagnosis not present

## 2020-03-11 DIAGNOSIS — I5032 Chronic diastolic (congestive) heart failure: Secondary | ICD-10-CM | POA: Insufficient documentation

## 2020-03-11 DIAGNOSIS — N183 Chronic kidney disease, stage 3 unspecified: Secondary | ICD-10-CM | POA: Diagnosis not present

## 2020-03-11 DIAGNOSIS — I130 Hypertensive heart and chronic kidney disease with heart failure and stage 1 through stage 4 chronic kidney disease, or unspecified chronic kidney disease: Secondary | ICD-10-CM | POA: Insufficient documentation

## 2020-03-11 DIAGNOSIS — I252 Old myocardial infarction: Secondary | ICD-10-CM | POA: Diagnosis not present

## 2020-03-11 LAB — BASIC METABOLIC PANEL
Anion gap: 6 (ref 5–15)
BUN: 28 mg/dL — ABNORMAL HIGH (ref 8–23)
CO2: 28 mmol/L (ref 22–32)
Calcium: 8.8 mg/dL — ABNORMAL LOW (ref 8.9–10.3)
Chloride: 105 mmol/L (ref 98–111)
Creatinine, Ser: 1.77 mg/dL — ABNORMAL HIGH (ref 0.61–1.24)
GFR calc Af Amer: 43 mL/min — ABNORMAL LOW (ref >60)
GFR calc non Af Amer: 37 mL/min — ABNORMAL LOW (ref >60)
Glucose, Bld: 121 mg/dL — ABNORMAL HIGH (ref 70–99)
Potassium: 3.9 mmol/L (ref 3.5–5.1)
Sodium: 139 mmol/L (ref 135–145)

## 2020-03-11 LAB — TYPE AND SCREEN
ABO/RH(D): A POS
Antibody Screen: NEGATIVE

## 2020-03-11 LAB — CBC
HCT: 48.9 % (ref 39.0–52.0)
Hemoglobin: 15.8 g/dL (ref 13.0–17.0)
MCH: 29.2 pg (ref 26.0–34.0)
MCHC: 32.3 g/dL (ref 30.0–36.0)
MCV: 90.4 fL (ref 80.0–100.0)
Platelets: 158 10*3/uL (ref 150–400)
RBC: 5.41 MIL/uL (ref 4.22–5.81)
RDW: 13.4 % (ref 11.5–15.5)
WBC: 8.2 10*3/uL (ref 4.0–10.5)
nRBC: 0 % (ref 0.0–0.2)

## 2020-03-11 LAB — HEMOGLOBIN A1C
Hgb A1c MFr Bld: 6.9 % — ABNORMAL HIGH (ref 4.8–5.6)
Mean Plasma Glucose: 151.33 mg/dL

## 2020-03-11 LAB — GLUCOSE, CAPILLARY: Glucose-Capillary: 136 mg/dL — ABNORMAL HIGH (ref 70–99)

## 2020-03-11 NOTE — Progress Notes (Signed)
PCP - Cherene Julian Cardiologist - Dr. Vita Barley  Chest x-ray no-  EKG - 12/05/19 Stress Test - no ECHO - 2018 Cardiac Cath - 20015,2018  Sleep Study - no CPAP -   Fasting Blood Sugar - Pt has no idea. Checks Blood Sugar __0___ times a day  Blood Thinner Instructions:Eliquis Aspirin Instructions:Stop 2 days prior to DOS. Dr. [name] Last Dose:5/7  Anesthesia review:   Patient denies shortness of breath, fever, cough and chest pain at PAT appointment yes  Patient verbalized understanding of instructions that were given to them at the PAT appointment. Patient was also instructed that they will need to review over the PAT instructions again at home before surgery. yes

## 2020-03-12 LAB — ABO/RH: ABO/RH(D): A POS

## 2020-03-12 NOTE — Progress Notes (Signed)
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 [name].   NSTEMI 02-04-2014  per cardiac cath chronic occluded RCA w/ faint left-to-right collaterals and aneurysmal LCFx with sluggish coronary flow/   NSTEMI --11-21-2016 per cardiac cath occluded proximal RCA & mid to diastal CFX 100%, med rx. If that does not work, PTCA or CABG  Per cardiology note 02/21/2020, "Chart reviewed as part of pre-operative protocol coverage. Given past medical history and time since last visit, based on ACC/AHA guidelines, Adam Harris would be at acceptable risk for the planned procedure without further cardiovascular testing.  Patient with diagnosis ofafibon Eliquisfor anticoagulation.  Procedure:Right robot assisted laparoscopic nephroureterectomy Date of procedure:TBD CHADS2-VASc score of5(age, CHF, HTN, DM, CAD) CrCl68 Platelet count196 Ok to hold Eliquis for 2-3 days prior to procedure as requested."  Anticipate pt can proceed with planned procedure barring acute status change.   VS: BP (!) 135/102 (BP Location: Right Arm)   Pulse 68   Temp 36.9 C (Oral)   Resp 18   Ht 5\' 11"  (1.803 m)   Wt 98.6 kg   SpO2 99%   BMI 30.33 kg/m   PROVIDERS: Ronnald Nian, DO is PCP   Minus Breeding, MD is Cardiologist  LABS: Labs reviewed: Acceptable for surgery. (all labs ordered are listed, but only abnormal results are displayed)  Labs Reviewed  GLUCOSE, CAPILLARY - Abnormal; Notable for the following components:      Result  Value   Glucose-Capillary 136 (*)    All other components within normal limits  ABO/RH     IMAGES:   EKG: 12/05/2019 Rate 78 bpm Atrial fibrillation  Nonspecific ST and T wave abnormality   CV: Echo 11/22/2016 Study Conclusions   - Left ventricle: The cavity size was normal. Systolic function was  normal. The estimated ejection fraction was in the range of 55%  to 60%. Wall motion was normal; there were no regional wall  motion abnormalities.  - Aortic valve: Trileaflet; moderately thickened, moderately  calcified leaflets.  - Mitral valve: There was mild regurgitation.  - Left atrium: The atrium was moderately dilated.  - Tricuspid valve: There was mild regurgitation.  - Pulmonary arteries: Systolic pressure was mildly increased. PA  peak pressure: 37 mm Hg (S).   Impressions:   - Compared to the prior study, there has been no significant  interval change.  Past Medical History:  Diagnosis Date  . Abnormal radiologic findings on diagnostic imaging of renal pelvis, ureter, or bladder    bilateral ureter abnormalities  . Anticoagulant long-term use    eliquis  . Anxiety   . Arthritis   . CAD (coronary artery disease) cardiologist-- dr hochrein   NSTEMI 02-04-2014  per cardiac cath chronic occluded RCA w/ faint left-to-right collaterals and aneurysmal LCFx with sluggish coronary flow/   NSTEMI --11-21-2016 per cardiac cath occluded proximal RCA & mid to diastal CFX 100%, med rx. If that does not work, PTCA or CABG  . Chronic diastolic heart failure ([place])   . CKD (chronic kidney  disease), stage III    patient unaware  . DOE (dyspnea on exertion)    2-3 flights of stairs  . Dysrhythmia 2019   A-Fib  . Fatty liver    pt denies  . Hematuria   . Hematuria 02/2019  . History of COVID-19 10/2019  . History of non-ST elevation myocardial infarction (NSTEMI)    02-04-2014  and 11-21-2016  cardiac cath done both times ,  medically management  . History of  shingles 12/2017   slight pain and numbness still noted in the area  . Hyperlipidemia   . Hypertension   . Insomnia   . Myocardial infarction ([place]) 2015  . Persistent atrial fibrillation [facility])    cardiologsit-- dr Percival Spanish  . Sciatica   . Thoracic aortic atherosclerosis ([place])   . Type 2 diabetes mellitus ([place])   . Urinary frequency     Past Surgical History:  Procedure Laterality Date  . CARDIAC CATHETERIZATION N/A 11/21/2016   Procedure: Left Heart Cath and Coronary Angiography;  Surgeon: Troy Sine, MD;  Location: [facility];  Service: Cardiovascular;  Laterality: N/A;  pRCA 100% , ostial LAD 45%, OM3 80%, mCFX to dCFX 100% (AV groove), lateral OM3 50%  . COLONOSCOPY    . CYSTOSCOPY WITH URETEROSCOPY AND STENT PLACEMENT Right 12/15/2019   Procedure: CYSTOSCOPY WITH RIGHT RETROGRADE/ RIGHT URETEROSCOPY/ BIOPSY;  Surgeon: [name], MD;  Location: [facility];  Service: Urology;  Laterality: Right;  . CYSTOSCOPY/RETROGRADE/URETEROSCOPY Bilateral 03/18/2018   Procedure: CYSTOSCOPY/RETROGRADE/URETEROSCOPY.;  Surgeon: [name], MD;  Location[facility];  Service: Urology;  Laterality: Bilateral;  . KNEE ARTHROSCOPY Right   . LEFT HEART CATHETERIZATION WITH CORONARY ANGIOGRAM N/A 02/04/2014   Procedure: LEFT HEART CATHETERIZATION WITH CORONARY ANGIOGRAM;  Surgeon: [name], MD;  Location: [facility] CATH LAB;  Service: Cardiovascular;  Laterality: N/A;  severe one-vessel CAD, chronically occluded RCA with faint left-to-right collaterals;  aneurysmal LCFx with sluggish coronary flow;  normal LVSF w/ moderately elevated LVEDP (ostialOM2 20%, pOM3 20%, pD1 20%, mCFX 50%, diffuse 20% pCFX)  . TRANSTHORACIC ECHOCARDIOGRAM  11-22-2016   dr hochrein   ef 55-60%/ mild MR and TR/ moderate LAE    MEDICATIONS: . diphenhydramine-acetaminophen (TYLENOL PM) 25-500 MG TABS tablet  . ELIQUIS 5 MG TABS tablet  . furosemide (LASIX) 20 MG tablet  . isosorbide  mononitrate (IMDUR) 30 MG 24 hr tablet  . metoprolol tartrate (LOPRESSOR) 25 MG tablet  . metoprolol tartrate (LOPRESSOR) 50 MG tablet  . nitroGLYCERIN (NITROSTAT) 0.4 MG SL tablet  . Olmesartan-amLODIPine-HCTZ 40-10-12.5 MG TABS  . phenazopyridine (PYRIDIUM) 200 MG tablet  . rosuvastatin (CRESTOR) 40 MG tablet  . sitaGLIPtin (JANUVIA) 100 MG tablet   No current facility-administered medications for this encounter.   . sodium phosphate (FLEET) 7-19 GM/118ML enema 1 enema    Maia Plan [facility] Pre-Surgical Testing [phone] 03/12/20  12:43 PM

## 2020-03-18 ENCOUNTER — Other Ambulatory Visit (HOSPITAL_COMMUNITY)
Admission: RE | Admit: 2020-03-18 | Discharge: 2020-03-18 | Disposition: A | Discharge status: Home / Self Care | Payer: Medicare Other | Source: Ambulatory Visit | Attending: Urology | Admitting: Urology

## 2020-03-18 DIAGNOSIS — Z20822 Contact with and (suspected) exposure to covid-19: Secondary | ICD-10-CM | POA: Diagnosis not present

## 2020-03-18 DIAGNOSIS — Z01812 Encounter for preprocedural laboratory examination: Secondary | ICD-10-CM | POA: Diagnosis not present

## 2020-03-18 LAB — SARS CORONAVIRUS 2 (TAT 6-24 HRS): SARS Coronavirus 2: NEGATIVE

## 2020-03-19 NOTE — H&P (Signed)
Office Visit Report     03/05/2020   --------------------------------------------------------------------------------   Adam Harris  MRN: [id number]  DOB: [birth year], 74 year old Male  SSN: [id number]   PRIMARY CARE:  Letta Median, MD  REFERRING:  Scott A. MacDiarmid, MD  PROVIDER:  [name], M.D.  TREATING:  [name], M.D.  LOCATION:  [facility] [phone]     --------------------------------------------------------------------------------   CC/HPI: Urothelial carcinoma    Adam Harris is a 74 year old gentleman followed since 2013 by Dr. 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 He was initially diagnosed on 01/18/12 after he was found to have an elevated PSA of 4.28. His biopsy demonstrated 10% of 1 out of 12 biopsy cores positive for Gleason 3+3=6 adenocarcinoma. A second biopsy performed on 07/24/17 again confirmed Gleason 3+3=6 adenocarcinoma in 5% of 1 out of 12 biopsy cores. His most recent PSA was 2.39 in December 2020. 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   He developed gross hematuria in the spring of 2019 that prompted an evaluation including a CT scan with concerning right hydroureter with ureteral enhancement. He underwent an evaluation including cystoscopy and bilateral ureteroscopy that was unremarkable. He again developed hematuria in April 2020 and urine cytology was negative at that time. Finally, he again developed gross hematuria in December 2020 prompting repeat imaging with CT that confirmed stable proximal right ureteral and renal collecting system enhancement. An incidental 2.8 cm right middle lobe pulmonary nodule was also seen. 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 Right ureteroscopic evaluation demonstrated a cytology that was positive for high grade urothelial carcinoma. He has now completed induction intravesical BCG.   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   His PMH is significant for CAD, hypertension, diabetes, hyperlipidemia, and atrial fibrillation (on Eliquis). He has no history of prior abdominal surgeries. His cardiologist is Dr. Minus Breeding. He was last seen in January and he has been cleared to proceed with surgery in his felt to be an acceptable risk. He also has been approved to stop his anticoagulation 48-72 hours before surgery. His last serum Cr was 1.3.   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     ALLERGIES: No Allergies    MEDICATIONS: Atorvastatin Calcium 20 MG Oral Tablet Oral  Eliquis 5 mg tablet Oral  MetFORMIN HCl - 500 MG Oral Tablet Oral  Metoprolol Tartrate 25 MG Oral Tablet Oral  Nitrostat 0.4 MG Sublingual Tablet Sublingual Sublingual  Tribenzor 40 mg-10 mg-25 mg tablet Oral     GU PSH: Bladder Instill AntiCA Agent - 02/18/2020, 02/11/2020, 02/04/2020, 01/28/2020, 01/21/2020, 01/14/2020 Cysto Bladder Ureth Biopsy - 12/15/2019 Cysto Uretero Biopsy Fulgura, Right - 12/15/2019 Cystoscopy - 2019 Cystoscopy Ureteroscopy, Bilateral - 03/18/2018 Locm 300-399Mg /Ml Iodine,1Ml - 01/14/2020, 11/24/2019, 2019 Prostate Needle Biopsy - 2018       PSH Notes: Knee Arthroscopy   NON-GU PSH: Surgical Pathology, Gross And Microscopic Examination For Prostate Needle - 2018     GU PMH: CIS of the bladder - 12/15/2019 Renal pelvis cancer, right, Right - 12/15/2019 Gross hematuria, Continues to have intermittent gross hematuria. It seems to be associated with some pain in his right flank region but no history of stones. With his previous abnormality of his ureter I have recommended we repeat  a CT scan with and without contrast and then will determine further evaluation once that is been completed. - 10/21/2019, He experienced gross hematuria. He is on Eliquis. My suspicion is that this  is going to be from the prostate since no other abnormality was noted previously., - 02/27/2019 Hydronephrosis (Stable), Right, He has mild right hydronephrosis noted on his CT scan that is unchanged and has been evaluated and noted to be nonobstructive in nature. - 02/27/2019 Prostate Cancer (Stable), His prostate remains benign to exam. I will obtain a PSA and have recommended he return in 6 months for repeat DRE and PSA. - 02/27/2019, (Stable), His prostate was noted to be entirely benign today. I will obtain a PSA today since he has due for a recheck., - 2019 (Stable), I went over his pathology report with him today which has revealed no evidence of grade or stage progression of his low risk prostate cancer. My recommendation to him has been continued active surveillance and he has agreed with this plan., - 2018 (Stable), I have discussed with the patient the possibility of blood per rectum, per urethra and in the ejaculate. He was counseled to contact me if he has any difficulties following his prostate biopsy whatsoever., - 2018 (Stable), His prostate was noted to be smooth and benign to examination however his PSA has risen significantly. It was 8.3 when we did his biopsy initially and then came down quite low. I recommended we repeat the PSA today but also I told him this time we proceed with a repeat biopsy., - 2018 (Stable), His prostate remains benign on his examination and his PSA continues to remain low and stable. I will continue active surveillance with DRE and PSA again in 6 months., - 2017, Adenocarcinoma of prostate, - 2017 Abnormal radiologic findings on diagnostic imaging of of renal pelvis, ureter, or bladder, Bilateral, Due to the finding of abnormalities of both his right ureter and left renal pelvic region we are going to proceed with further evaluation with cystoscopy, bilateral retrograde pyelography, bilateral ureteroscopy and possible bilateral double-J stent placement. -  2019 Microscopic hematuria, He was noted to have microscopic hematuria today. We therefore have discussed the need for further evaluation. He did not appear to have any infection but did have a few white cells so I am going to culture his urine. We will then obtain a creatinine and schedule him for a CT scan to evaluate the upper tract and will then have him return for lower tract evaluation with cystoscopy. - 2019 BPH w/o LUTS (Stable), He does have some slight prostatic enlargement on exam but does not have significant voiding symptoms. - 2018 BPH w/LUTS (Stable), He has some BPH by exam with nocturia 2 but he said it is not significant enough that he would want to consider any form of pharmacologic therapy at this time. - 2017, Benign prostatic hyperplasia with urinary obstruction, - 2016 Nocturia (Stable), His nocturia is not a significant bother to him. 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 He had no worrisome nodularity or induration noted on DRE but did have a mild rectal stricture. He underwent TRUS/BX on 01/18/12 at which time his prostate was noted to be 56 cc.  Pathology: He was found to have a single core positive for Gleason 3+3 = 6 adenocarcinoma involving 10% of that  core.  Treatment: Active surveillance  Repeat TRUS/BX 07/24/17: Prostate volume - 57 cc  Pathology: 1 positive for Gleason 6 in 5% from the left apex laterally.  Treatment: Continued active surveillance.    BPH with LUTS: He reported having some slowing of his urinary stream as well as nocturia.  Treatment: None currently   Left hydrocele: At one point he found it to be a bother but he says it really not giving him any trouble other than occasionally when he crosses his legs. It is not increasing in size.   Microscopic hematuria: He was noted to have microscopic hematuria in  2/19. Evaluation with a CT scan revealed possible abnormalities of the right ureter and left renal pelvic region and therefore on 03/18/18 he underwent cystoscopy with bilateral retrogrades and bilateral ureteroscopy. No abnormalities were noted. Are because urine from the right ureter was negative.  Cytology 4 /20: Negative.      NON-GU PMH: Neoplasm of uncertain behavior of trachea, bronchus and lung - 01/06/2020 Encounter for general adult medical examination without abnormal findings, Encounter for preventive health examination - 2017 Atherosclerotic heart disease of native coronary artery without angina pectoris, CAD (coronary artery disease) - 2016 Personal history of other diseases of the circulatory system, History of angina pectoris - 2016, History of atrial fibrillation, - 2016, History of hypertension, - 2014 Personal history of other endocrine, nutritional and metabolic disease, History of hypercholesterolemia - 2016, History of type 2 diabetes mellitus, - 2016 Stenosis of anus and rectum, Rectal stricture - 2016    FAMILY HISTORY: Family Health Status - Mother's Age - Runs In Family Family Health Status Number - Runs In Family Father Deceased At [place] ___ - Runs In Family Hypertension - Mother   SOCIAL HISTORY: Marital Status: Married Preferred Language: English; Race: White Has never drank.  Drinks 3 caffeinated drinks per day.     Notes: Former smoker, Occupation:, Marital History - Currently Married, Caffeine Use, Alcohol Use   REVIEW OF SYSTEMS:    GU Review Male:   Patient denies frequent urination, hard to postpone urination, burning/ pain with urination, get up at night to urinate, leakage of urine, stream starts and stops, trouble starting your streams, and have to strain to urinate .  Gastrointestinal (Lower):   Patient denies diarrhea and constipation.  Gastrointestinal (Upper):   Patient denies nausea and vomiting.  Constitutional:   Patient denies fever, night  sweats, weight loss, and fatigue.  Skin:   Patient denies skin rash/ lesion and itching.  Eyes:   Patient denies blurred vision and double vision.  Ears/ Nose/ Throat:   Patient denies sore throat and sinus problems.  Hematologic/Lymphatic:   Patient denies swollen glands and easy bruising.  Cardiovascular:   Patient denies leg swelling and chest pains.  Respiratory:   Patient denies cough and shortness of breath.  Endocrine:   Patient denies excessive thirst.  Musculoskeletal:   Patient denies back pain and joint pain.  Neurological:   Patient denies headaches and dizziness.  Psychologic:   Patient denies depression and anxiety.   VITAL SIGNS:      03/05/2020 11:34 AM  Weight 215 lb / 97.52 kg  Height 71 in / 180.34 cm  BP 144/104 mmHg  Pulse 85 /min  Temperature 96.9 F / 36.0 C  BMI 30.0 kg/m   MULTI-SYSTEM PHYSICAL EXAMINATION:    Constitutional: Well-nourished. No physical deformities. Normally developed. Good grooming.  Neck: Neck symmetrical, not swollen. Normal tracheal position.  Respiratory: No labored  breathing, no use of accessory muscles. Clear bilaterally.  Cardiovascular: Normal temperature, normal extremity pulses, no swelling, no varicosities. Irregularly, irregular.  Lymphatic: No enlargement of neck, axillae, groin.  Skin: No paleness, no jaundice, no cyanosis. No lesion, no ulcer, no rash.  Neurologic / Psychiatric: Oriented to time, oriented to place, oriented to person. No depression, no anxiety, no agitation.  Gastrointestinal: No mass, no tenderness, no rigidity, non obese abdomen.  Eyes: Normal conjunctivae. Normal eyelids.  Ears, Nose, Mouth, and Throat: Left ear no scars, no lesions, no masses. Right ear no scars, no lesions, no masses. Nose no scars, no lesions, no masses. Normal hearing. Normal lips.  Musculoskeletal: Normal gait and station of head and neck.     Complexity of Data:  Records Review:   Previous Patient Records  Urine Test Review:    Urinalysis  X-Ray Review: C.T. Chest: Reviewed Films.     10/21/19 02/27/19 01/08/18 07/11/17 06/07/17 07/19/16 12/14/15 05/28/15  PSA  Total PSA 2.39 ng/mL 3.27 ng/mL 1.67 ng/mL 4.28 ng/mL 10.80 ng/mL 1.92 ng/dl 1.68  3.03    Notes:                     CT CHEST WITH CONTRAST 01/14/2020   --------------------------------------------------------------------------------   Forrest Moron  MRN: [id number] Ordering Provider: [name], JR  DOB: [birth year] Date Collected: 01/14/2020 08:00:00  SSN: [id number] Date Completed: 01/14/2020 16:02:57   --------------------------------------------------------------------------------   CLINICAL DATA: Renal pelvic cancer and bladder cancer. Pulmonary  nodule. Prostate cancer 8 years ago on active surveillance.  Nonsmoker. No chest complaints.   EXAM:  CT CHEST WITH CONTRAST   TECHNIQUE:  Multidetector CT imaging of the chest was performed during  intravenous contrast administration.   CONTRAST: 100 cc Omnipaque 300   COMPARISON: Abdominal CT 11/24/2019. No prior chest CT. Chest  radiograph 12/12/2016 is reviewed.   FINDINGS:  Cardiovascular: Aortic and branch vessel atherosclerosis. Tortuous  thoracic aorta. Borderline cardiomegaly with left atrial  enlargement. Multivessel coronary artery atherosclerosis. No central  pulmonary embolism, on this non-dedicated study.   Mediastinum/Nodes: Small left low jugular nodes are not pathologic  by size criteria. Multiple small middle and anterior mediastinal  nodes are likely reactive. None are pathologic by size criteria. No  hilar adenopathy.   Lungs/Pleura: No pleural fluid. Mild centrilobular emphysema.   The previously detailed right middle lobe nodule is actually a bulla  or bleb along the anterior most right minor fissure, including on  73/3. 2.6 cm.   Numerous basilar predominant primarily calcified pulmonary nodules.  There are other nodules, on the order of 3-4 mm, which are not   definitely calcified. Example in the left lower lobe at 4 mm on 90/3  in the right lower lobe at 4 mm on 69/3. No dominant or highly  suspicious pulmonary nodule identified.   Upper Abdomen: Heterogeneous high right hepatic lobe enhancement,  likely related to small perfusion anomalies. Normal imaged portions  of the spleen, stomach, pancreas, gallbladder, adrenal glands,  kidneys.   Musculoskeletal: Mild left-sided gynecomastia. Upper thoracic  spondylosis.   IMPRESSION:  1. Innumerable calcified granulomas. Other small nodules are not  definitely calcified and therefore technically indeterminate. Felt  unlikely to represent pulmonary metastasis. Consider chest CT  follow-up at 6-12 months. Alternatively, many of these nodules could  be re-evaluated on abdominal CT surveillance.  2. The questioned nodule in the right middle lobe on the prior  abdominal CT is actually a bulla or bleb along the anterior most  right minor fissure. No suspicious or dominant pulmonary nodule.  3. No thoracic adenopathy.  4. Aortic atherosclerosis (ICD10-I70.0), coronary artery  atherosclerosis and emphysema (ICD10-J43.9).  5. Mild left gynecomastia.    Electronically Signed  By: Abigail Miyamoto M.D.  On: 01/14/2020 16:00    PROCEDURES:         Flexible Cystoscopy - 52000  Indication: Carcinoma in situ of the bladder Risks, benefits, and potential complications of the procedure were discussed with the patient including infection, bleeding, voiding discomfort, urinary retention, fever, chills, sepsis, and others. All questions were answered. Informed consent was obtained. Sterile technique and intraurethral analgesia were used.  Meatus:  Normal size. Normal location. Normal condition.  Urethra:  No strictures.  External Sphincter:  Normal.  Verumontanum:  Normal.  Prostate:  Mild hyperplasia. Non-obstructing.  Bladder Neck:  Non-obstructing.  Ureteral Orifices:  Normal location. Normal size. Normal  shape. Effluxed clear urine.  Bladder:  Systematic examination of the bladder reveals no obvious bladder tumors, stones. There are areas of erythema along the trigone particularly toward the right side. These findings are consistent with BCG effect although persistent/recurrent carcinoma in situ cannot be completely ruled out. A bladder washing was obtained for cytology.      Chaperone: Alleen Borne The procedure was well-tolerated and without complications. Instructions were given to call the office immediately if questions or problems.         Urinalysis w/Scope Dipstick Dipstick Cont'd Micro  Color: Amber Bilirubin: Neg mg/dL WBC/hpf: 6 - 10/hpf  Appearance: Cloudy Ketones: Neg mg/dL RBC/hpf: >60/hpf  Specific Gravity: 1.025 Blood: 3+ ery/uL Bacteria: NS (Not Seen)  pH: 6.0 Protein: 3+ mg/dL Cystals: NS (Not Seen)  Glucose: Neg mg/dL Urobilinogen: 1.0 mg/dL Casts: NS (Not Seen)    Nitrites: Neg Trichomonas: Not Present    Leukocyte Esterase: Trace leu/uL Mucous: Not Present      Epithelial Cells: 0 - 5/hpf      Yeast: NS (Not Seen)      Sperm: Not Present    ASSESSMENT:      ICD-10 Details  1 GU:   CIS of the bladder - D09.0   2   Renal pelvis cancer, 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          Document Letter(s):  Created for Patient: Clinical Summary         Notes:   1. Carcinoma in situ of the bladder: A bladder washing has been obtained for cytology today. If his cytology is negative, I do think we can proceed with treatment of his right-sided upper tract urothelial carcinoma. If his cytology is suspicious or positive, I do think that we should proceed with cystoscopy and repeat biopsy to exclude the presence of persistent carcinoma in situ. We would then discussed the  options of proceeding with a repeat induction course of BCG versus cystectomy. We would discuss this in the context of how to address his right upper urinary tract urothelial cancer.   2. 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 We have discussed that procedure in detail today including the potential risks. These include but are not limited to potential cardiopulmonary risks associated with general anesthesia, bleeding, infection, damage to adjacent organs or structures, decline in renal function, recurrent malignancy, etc. We discussed the ongoing need for surveillance and possible further treatment. We discussed the expected postoperative recovery course. All questions were answered to his stated satisfaction.   He will be instructed to hold his anticoagulation for a minimum of 48 hours prior to his planned surgery.   3. Low risk prostate cancer: Following definitive therapy for his upper tract urothelial tumor, we will discuss repeating his evaluation for his low risk prostate cancer discuss ongoing surveillance.   Cc: Dr. Letta Median  Dr. Minus Breeding  Dr. [name]     * Signed by [name], M.D. on 03/05/20 at 4:43 PM (EDT)*

## 2020-03-21 NOTE — Anesthesia Preprocedure Evaluation (Addendum)
Anesthesia Evaluation  Patient identified by MRN, date of birth, ID band Patient awake    Reviewed: Allergy & Precautions, NPO status , Patient's Chart, lab work & pertinent test results, reviewed documented beta blocker date and time   History of Anesthesia Complications Negative for: history of anesthetic complications  Airway Mallampati: II  TM Distance: >3 FB Neck ROM: Full    Dental  (+) Edentulous Upper, Edentulous Lower   Pulmonary neg PE 03/18/2020 SARS coronavirus NEG   breath sounds clear to auscultation       Cardiovascular hypertension, Pt. on medications and Pt. on home beta blockers + angina + CAD and + Past MI  + dysrhythmias Atrial Fibrillation  Rhythm:Regular Rate:Normal  '18 cath: Prox RCA lesion, 100 %stenosed.  Ost LAD lesion, 45 %stenosed.  3rd Mrg lesion, 80 %stenosed.  Mid Cx to Dist Cx lesion, 100 %stenosed.  Lat 3rd Mrg lesion, 50 %stenosed.  '18 ECHO:  EF 55-60%, mild MR, some AV thickening but no stenosis, mild TR   Neuro/Psych  Headaches, Anxiety    GI/Hepatic negative GI ROS, Neg liver ROS,   Endo/Other  diabetes (glu 96), Oral Hypoglycemic Agents  Renal/GU Renal InsufficiencyRenal disease     Musculoskeletal  (+) Arthritis ,   Abdominal   Peds  Hematology Eliquis: last dose saturday   Anesthesia Other Findings   Reproductive/Obstetrics                            Anesthesia Physical Anesthesia Plan  ASA: III  Anesthesia Plan: General   Post-op Pain Management:    Induction: Intravenous  PONV Risk Score and Plan: 3 and Ondansetron, Dexamethasone and Treatment may vary due to age or medical condition  Airway Management Planned: Oral ETT  Additional Equipment: Arterial line  Intra-op Plan:   Post-operative Plan: Extubation in OR  Informed Consent: I have reviewed the patients History and Physical, chart, labs and discussed the procedure  including the risks, benefits and alternatives for the proposed anesthesia with the patient or authorized representative who has indicated his/her understanding and acceptance.       Plan Discussed with: CRNA and Surgeon  Anesthesia Plan Comments:        Anesthesia Quick Evaluation

## 2020-03-22 ENCOUNTER — Other Ambulatory Visit: Payer: Self-pay

## 2020-03-22 ENCOUNTER — Encounter (HOSPITAL_COMMUNITY): Payer: Self-pay | Admitting: Urology

## 2020-03-22 ENCOUNTER — Encounter (HOSPITAL_COMMUNITY)
Admission: RE | Disposition: A | Discharge status: Home / Self Care | Payer: Self-pay | Source: Ambulatory Visit | Attending: Urology

## 2020-03-22 ENCOUNTER — Inpatient Hospital Stay (HOSPITAL_COMMUNITY): Payer: Medicare Other | Admitting: Physician Assistant

## 2020-03-22 ENCOUNTER — Inpatient Hospital Stay (HOSPITAL_COMMUNITY): Payer: Medicare Other | Admitting: Certified Registered Nurse Anesthetist

## 2020-03-22 ENCOUNTER — Inpatient Hospital Stay (HOSPITAL_COMMUNITY)
Admission: RE | Admit: 2020-03-22 | Discharge: 2020-03-24 | DRG: 658 | Disposition: A | Discharge status: Home / Self Care | Payer: Medicare Other | Source: Ambulatory Visit | Attending: Urology | Admitting: Urology

## 2020-03-22 DIAGNOSIS — J432 Centrilobular emphysema: Secondary | ICD-10-CM | POA: Diagnosis present

## 2020-03-22 DIAGNOSIS — M47814 Spondylosis without myelopathy or radiculopathy, thoracic region: Secondary | ICD-10-CM | POA: Diagnosis present

## 2020-03-22 DIAGNOSIS — E785 Hyperlipidemia, unspecified: Secondary | ICD-10-CM | POA: Diagnosis present

## 2020-03-22 DIAGNOSIS — C651 Malignant neoplasm of right renal pelvis: Secondary | ICD-10-CM | POA: Diagnosis not present

## 2020-03-22 DIAGNOSIS — D0919 Carcinoma in situ of other urinary organs: Secondary | ICD-10-CM | POA: Diagnosis not present

## 2020-03-22 DIAGNOSIS — N62 Hypertrophy of breast: Secondary | ICD-10-CM | POA: Diagnosis present

## 2020-03-22 DIAGNOSIS — I4891 Unspecified atrial fibrillation: Secondary | ICD-10-CM | POA: Diagnosis present

## 2020-03-22 DIAGNOSIS — I2510 Atherosclerotic heart disease of native coronary artery without angina pectoris: Secondary | ICD-10-CM | POA: Diagnosis not present

## 2020-03-22 DIAGNOSIS — Z8249 Family history of ischemic heart disease and other diseases of the circulatory system: Secondary | ICD-10-CM | POA: Diagnosis not present

## 2020-03-22 DIAGNOSIS — D090 Carcinoma in situ of bladder: Secondary | ICD-10-CM | POA: Diagnosis not present

## 2020-03-22 DIAGNOSIS — Z87891 Personal history of nicotine dependence: Secondary | ICD-10-CM

## 2020-03-22 DIAGNOSIS — I214 Non-ST elevation (NSTEMI) myocardial infarction: Secondary | ICD-10-CM | POA: Diagnosis not present

## 2020-03-22 DIAGNOSIS — C689 Malignant neoplasm of urinary organ, unspecified: Secondary | ICD-10-CM | POA: Diagnosis present

## 2020-03-22 DIAGNOSIS — I700 Atherosclerosis of aorta: Secondary | ICD-10-CM | POA: Diagnosis not present

## 2020-03-22 DIAGNOSIS — I517 Cardiomegaly: Secondary | ICD-10-CM | POA: Diagnosis not present

## 2020-03-22 DIAGNOSIS — I4819 Other persistent atrial fibrillation: Secondary | ICD-10-CM | POA: Diagnosis not present

## 2020-03-22 DIAGNOSIS — D4111 Neoplasm of uncertain behavior of right renal pelvis: Secondary | ICD-10-CM | POA: Diagnosis not present

## 2020-03-22 DIAGNOSIS — E119 Type 2 diabetes mellitus without complications: Secondary | ICD-10-CM | POA: Diagnosis present

## 2020-03-22 HISTORY — PX: ROBOT ASSITED LAPAROSCOPIC NEPHROURETERECTOMY: SHX6077

## 2020-03-22 LAB — BASIC METABOLIC PANEL
Anion gap: 10 (ref 5–15)
BUN: 32 mg/dL — ABNORMAL HIGH (ref 8–23)
CO2: 24 mmol/L (ref 22–32)
Calcium: 9 mg/dL (ref 8.9–10.3)
Chloride: 106 mmol/L (ref 98–111)
Creatinine, Ser: 2.1 mg/dL — ABNORMAL HIGH (ref 0.61–1.24)
GFR calc Af Amer: 35 mL/min — ABNORMAL LOW (ref >60)
GFR calc non Af Amer: 30 mL/min — ABNORMAL LOW (ref >60)
Glucose, Bld: 161 mg/dL — ABNORMAL HIGH (ref 70–99)
Potassium: 4.6 mmol/L (ref 3.5–5.1)
Sodium: 140 mmol/L (ref 135–145)

## 2020-03-22 LAB — HEMOGLOBIN AND HEMATOCRIT, BLOOD
HCT: 48.8 % (ref 39.0–52.0)
Hemoglobin: 15.8 g/dL (ref 13.0–17.0)

## 2020-03-22 LAB — GLUCOSE, CAPILLARY
Glucose-Capillary: 96 mg/dL (ref 70–99)
Glucose-Capillary: 161 mg/dL — ABNORMAL HIGH (ref 70–99)
Glucose-Capillary: 130 mg/dL — ABNORMAL HIGH (ref 70–99)
Glucose-Capillary: 163 mg/dL — ABNORMAL HIGH (ref 70–99)

## 2020-03-22 SURGERY — NEPHROURETERECTOMY, ROBOT-ASSISTED, LAPAROSCOPIC
Anesthesia: General | Laterality: Right

## 2020-03-22 MED ORDER — MEPERIDINE HCL 50 MG/ML IJ SOLN: 6.2500 mg | INTRAMUSCULAR | Status: DC | PRN

## 2020-03-22 MED ORDER — PHENYLEPHRINE HCL-NACL 10-0.9 MG/250ML-% IV SOLN
INTRAVENOUS | Status: DC | PRN
Start: 2020-03-22 — End: 2020-03-22
  Administered 2020-03-22: 20 ug/min via INTRAVENOUS

## 2020-03-22 MED ORDER — MORPHINE SULFATE (PF) 2 MG/ML IV SOLN: 2.0000 mg | INTRAVENOUS | Status: DC | PRN

## 2020-03-22 MED ORDER — ROSUVASTATIN CALCIUM 20 MG PO TABS
40.0000 mg | ORAL_TABLET | Freq: Every day | ORAL | Status: DC
  Administered 2020-03-23: 40 mg via ORAL
  Filled 2020-03-22: qty 2

## 2020-03-22 MED ORDER — ACETAMINOPHEN 325 MG PO TABS
650.0000 mg | ORAL_TABLET | ORAL | Status: DC
  Administered 2020-03-22 – 2020-03-24 (×10): 650 mg via ORAL
  Filled 2020-03-22 (×11): qty 2

## 2020-03-22 MED ORDER — LACTATED RINGERS IV SOLN
INTRAVENOUS | Status: DC | PRN
Start: 2020-03-22 — End: 2020-03-22

## 2020-03-22 MED ORDER — LACTATED RINGERS IR SOLN
Status: DC | PRN
  Administered 2020-03-22: 1000 mL

## 2020-03-22 MED ORDER — HYDROMORPHONE HCL 1 MG/ML IJ SOLN
INTRAMUSCULAR | Status: AC
  Filled 2020-03-22: qty 2

## 2020-03-22 MED ORDER — SULFAMETHOXAZOLE-TRIMETHOPRIM 800-160 MG PO TABS
1.0000 | ORAL_TABLET | Freq: Two times a day (BID) | ORAL | 0 refills | Status: DC
Start: 2020-03-22 — End: 2020-08-12

## 2020-03-22 MED ORDER — ISOSORBIDE MONONITRATE ER 30 MG PO TB24
30.0000 mg | ORAL_TABLET | Freq: Every day | ORAL | Status: DC
  Administered 2020-03-23 – 2020-03-24 (×2): 30 mg via ORAL
  Filled 2020-03-22 (×2): qty 1

## 2020-03-22 MED ORDER — DEXAMETHASONE SODIUM PHOSPHATE 10 MG/ML IJ SOLN
INTRAMUSCULAR | Status: DC | PRN
  Administered 2020-03-22: 4 mg via INTRAVENOUS

## 2020-03-22 MED ORDER — ONDANSETRON HCL 4 MG/2ML IJ SOLN
INTRAMUSCULAR | Status: DC | PRN
  Administered 2020-03-22: 4 mg via INTRAVENOUS

## 2020-03-22 MED ORDER — PROPOFOL 10 MG/ML IV BOLUS
INTRAVENOUS | Status: DC | PRN
  Administered 2020-03-22: 90 mg via INTRAVENOUS

## 2020-03-22 MED ORDER — PROMETHAZINE HCL 25 MG/ML IJ SOLN
6.2500 mg | INTRAMUSCULAR | Status: DC | PRN
  Administered 2020-03-22: 12.5 mg via INTRAVENOUS

## 2020-03-22 MED ORDER — METOPROLOL TARTRATE 25 MG PO TABS
25.0000 mg | ORAL_TABLET | Freq: Two times a day (BID) | ORAL | Status: DC
  Administered 2020-03-23 – 2020-03-24 (×3): 25 mg via ORAL
  Filled 2020-03-22 (×3): qty 1

## 2020-03-22 MED ORDER — DOCUSATE SODIUM 100 MG PO CAPS
100.0000 mg | ORAL_CAPSULE | Freq: Two times a day (BID) | ORAL | Status: DC
  Administered 2020-03-22 – 2020-03-24 (×4): 100 mg via ORAL
  Filled 2020-03-22 (×4): qty 1

## 2020-03-22 MED ORDER — MAGNESIUM CITRATE PO SOLN: 0.5000 | Freq: Once | ORAL | Status: DC

## 2020-03-22 MED ORDER — FENTANYL CITRATE (PF) 100 MCG/2ML IJ SOLN
INTRAMUSCULAR | Status: DC | PRN
  Administered 2020-03-22 (×2): 50 ug via INTRAVENOUS
  Administered 2020-03-22: 100 ug via INTRAVENOUS
  Administered 2020-03-22 (×3): 50 ug via INTRAVENOUS

## 2020-03-22 MED ORDER — BELLADONNA ALKALOIDS-OPIUM 16.2-60 MG RE SUPP: 1.0000 | Freq: Four times a day (QID) | RECTAL | Status: DC | PRN

## 2020-03-22 MED ORDER — SODIUM CHLORIDE (PF) 0.9 % IJ SOLN
INTRAMUSCULAR | Status: DC | PRN
  Administered 2020-03-22: 20 mL

## 2020-03-22 MED ORDER — ROCURONIUM BROMIDE 10 MG/ML (PF) SYRINGE
PREFILLED_SYRINGE | INTRAVENOUS | Status: AC
  Filled 2020-03-22: qty 10

## 2020-03-22 MED ORDER — INSULIN ASPART 100 UNIT/ML ~~LOC~~ SOLN
0.0000 [IU] | Freq: Three times a day (TID) | SUBCUTANEOUS | Status: DC
  Administered 2020-03-22 – 2020-03-23 (×2): 2 [IU] via SUBCUTANEOUS
  Administered 2020-03-23 (×2): 3 [IU] via SUBCUTANEOUS

## 2020-03-22 MED ORDER — ONDANSETRON HCL 4 MG/2ML IJ SOLN: 4.0000 mg | INTRAMUSCULAR | Status: DC | PRN

## 2020-03-22 MED ORDER — STERILE WATER FOR IRRIGATION IR SOLN
Status: DC | PRN
  Administered 2020-03-22: 1000 mL

## 2020-03-22 MED ORDER — SODIUM CHLORIDE (PF) 0.9 % IJ SOLN
INTRAMUSCULAR | Status: AC
  Filled 2020-03-22: qty 20

## 2020-03-22 MED ORDER — LACTATED RINGERS IV SOLN: INTRAVENOUS | Status: DC

## 2020-03-22 MED ORDER — DIPHENHYDRAMINE HCL 12.5 MG/5ML PO ELIX: 12.5000 mg | ORAL_SOLUTION | Freq: Four times a day (QID) | ORAL | Status: DC | PRN

## 2020-03-22 MED ORDER — FENTANYL CITRATE (PF) 100 MCG/2ML IJ SOLN
INTRAMUSCULAR | Status: AC
  Filled 2020-03-22: qty 2

## 2020-03-22 MED ORDER — FENTANYL CITRATE (PF) 250 MCG/5ML IJ SOLN
INTRAMUSCULAR | Status: AC
  Filled 2020-03-22: qty 5

## 2020-03-22 MED ORDER — LIDOCAINE 5 % EX PTCH
1.0000 | MEDICATED_PATCH | CUTANEOUS | Status: DC
  Administered 2020-03-22 – 2020-03-23 (×2): 1 via TRANSDERMAL
  Filled 2020-03-22 (×2): qty 1

## 2020-03-22 MED ORDER — DIPHENHYDRAMINE HCL 50 MG/ML IJ SOLN: 12.5000 mg | Freq: Four times a day (QID) | INTRAMUSCULAR | Status: DC | PRN

## 2020-03-22 MED ORDER — CHLORHEXIDINE GLUCONATE CLOTH 2 % EX PADS
6.0000 | MEDICATED_PAD | Freq: Every day | CUTANEOUS | Status: DC
  Administered 2020-03-23: 11:00:00 6 via TOPICAL

## 2020-03-22 MED ORDER — HYDROMORPHONE HCL 1 MG/ML IJ SOLN
0.2500 mg | INTRAMUSCULAR | Status: DC | PRN
  Administered 2020-03-22: 0.5 mg via INTRAVENOUS

## 2020-03-22 MED ORDER — HYDRALAZINE HCL 20 MG/ML IJ SOLN: 5.0000 mg | INTRAMUSCULAR | Status: DC | PRN

## 2020-03-22 MED ORDER — ONDANSETRON HCL 4 MG/2ML IJ SOLN
INTRAMUSCULAR | Status: AC
  Filled 2020-03-22: qty 2

## 2020-03-22 MED ORDER — MIDAZOLAM HCL 2 MG/2ML IJ SOLN: 0.5000 mg | Freq: Once | INTRAMUSCULAR | Status: DC | PRN

## 2020-03-22 MED ORDER — TRAMADOL HCL 50 MG PO TABS: 50.0000 mg | ORAL_TABLET | Freq: Four times a day (QID) | ORAL | 0 refills | Status: DC | PRN

## 2020-03-22 MED ORDER — CEFAZOLIN SODIUM-DEXTROSE 2-4 GM/100ML-% IV SOLN
2.0000 g | Freq: Once | INTRAVENOUS | Status: AC
  Administered 2020-03-22: 08:00:00 2 g via INTRAVENOUS
  Filled 2020-03-22: qty 100

## 2020-03-22 MED ORDER — ROCURONIUM BROMIDE 10 MG/ML (PF) SYRINGE
PREFILLED_SYRINGE | INTRAVENOUS | Status: DC | PRN
  Administered 2020-03-22: 20 mg via INTRAVENOUS
  Administered 2020-03-22 (×3): 10 mg via INTRAVENOUS
  Administered 2020-03-22: 60 mg via INTRAVENOUS

## 2020-03-22 MED ORDER — SODIUM CHLORIDE 0.45 % IV SOLN: INTRAVENOUS | Status: DC

## 2020-03-22 MED ORDER — LIDOCAINE 2% (20 MG/ML) 5 ML SYRINGE
INTRAMUSCULAR | Status: AC
  Filled 2020-03-22: qty 5

## 2020-03-22 MED ORDER — LIDOCAINE 2% (20 MG/ML) 5 ML SYRINGE
INTRAMUSCULAR | Status: DC | PRN
  Administered 2020-03-22: 80 mg via INTRAVENOUS

## 2020-03-22 MED ORDER — SUGAMMADEX SODIUM 200 MG/2ML IV SOLN
INTRAVENOUS | Status: DC | PRN
  Administered 2020-03-22: 200 mg via INTRAVENOUS

## 2020-03-22 MED ORDER — BUPIVACAINE LIPOSOME 1.3 % IJ SUSP
20.0000 mL | Freq: Once | INTRAMUSCULAR | Status: AC
  Administered 2020-03-22: 20 mL
  Filled 2020-03-22: qty 20

## 2020-03-22 MED ORDER — NITROGLYCERIN 0.4 MG SL SUBL: 0.4000 mg | SUBLINGUAL_TABLET | SUBLINGUAL | Status: DC | PRN

## 2020-03-22 MED ORDER — SENNA 8.6 MG PO TABS
1.0000 | ORAL_TABLET | Freq: Every day | ORAL | Status: DC
  Administered 2020-03-22 – 2020-03-24 (×3): 8.6 mg via ORAL
  Filled 2020-03-22 (×3): qty 1

## 2020-03-22 MED ORDER — PROMETHAZINE HCL 25 MG/ML IJ SOLN
INTRAMUSCULAR | Status: AC
  Filled 2020-03-22: qty 1

## 2020-03-22 MED ORDER — OXYCODONE HCL 5 MG PO TABS
5.0000 mg | ORAL_TABLET | ORAL | Status: DC | PRN
  Administered 2020-03-22: 5 mg via ORAL
  Filled 2020-03-22: qty 1

## 2020-03-22 MED ORDER — PHENYLEPHRINE 40 MCG/ML (10ML) SYRINGE FOR IV PUSH (FOR BLOOD PRESSURE SUPPORT)
PREFILLED_SYRINGE | INTRAVENOUS | Status: DC | PRN
  Administered 2020-03-22: 60 ug via INTRAVENOUS
  Administered 2020-03-22 (×2): 80 ug via INTRAVENOUS
  Administered 2020-03-22: 40 ug via INTRAVENOUS
  Administered 2020-03-22: 60 ug via INTRAVENOUS
  Administered 2020-03-22: 40 ug via INTRAVENOUS

## 2020-03-22 MED ORDER — PROPOFOL 10 MG/ML IV BOLUS
INTRAVENOUS | Status: AC
  Filled 2020-03-22: qty 20

## 2020-03-22 SURGICAL SUPPLY — 64 items
BAG LAPAROSCOPIC 12 15 PORT 16 (BASKET) ×1 IMPLANT
BAG RETRIEVAL 12/15 (BASKET) ×2
CATH FOLEY 3WAY  5CC 18FR (CATHETERS) ×2
CATH FOLEY 3WAY 30CC 16FR (CATHETERS) ×1 IMPLANT
CATH FOLEY 3WAY 5CC 18FR (CATHETERS) ×1 IMPLANT
CHLORAPREP W/TINT 26 (MISCELLANEOUS) ×2 IMPLANT
CLIP VESOLOCK LG 6/CT PURPLE (CLIP) ×3 IMPLANT
CLIP VESOLOCK MED LG 6/CT (CLIP) ×2 IMPLANT
COVER SURGICAL LIGHT HANDLE (MISCELLANEOUS) ×2 IMPLANT
COVER TIP SHEARS 8 DVNC (MISCELLANEOUS) ×1 IMPLANT
COVER TIP SHEARS 8MM DA VINCI (MISCELLANEOUS) ×2
COVER WAND RF STERILE (DRAPES) IMPLANT
CUTTER FLEX LINEAR 45M (STAPLE) ×1 IMPLANT
DECANTER SPIKE VIAL GLASS SM (MISCELLANEOUS) ×2 IMPLANT
DERMABOND ADVANCED (GAUZE/BANDAGES/DRESSINGS) ×1
DERMABOND ADVANCED .7 DNX12 (GAUZE/BANDAGES/DRESSINGS) ×2 IMPLANT
DRAIN CHANNEL 15F RND FF 3/16 (WOUND CARE) ×2 IMPLANT
DRAPE ARM DVNC X/XI (DISPOSABLE) ×4 IMPLANT
DRAPE COLUMN DVNC XI (DISPOSABLE) ×1 IMPLANT
DRAPE DA VINCI XI ARM (DISPOSABLE) ×8
DRAPE DA VINCI XI COLUMN (DISPOSABLE) ×2
DRAPE INCISE IOBAN 66X45 STRL (DRAPES) ×2 IMPLANT
DRAPE LAPAROSCOPIC ABDOMINAL (DRAPES) ×2 IMPLANT
DRAPE SHEET LG 3/4 BI-LAMINATE (DRAPES) ×2 IMPLANT
DRSG TEGADERM 4X4.75 (GAUZE/BANDAGES/DRESSINGS) ×2 IMPLANT
DRSG TEGADERM 6X8 (GAUZE/BANDAGES/DRESSINGS) ×1 IMPLANT
ELECT PENCIL ROCKER SW 15FT (MISCELLANEOUS) ×2 IMPLANT
ELECT REM PT RETURN 15FT ADLT (MISCELLANEOUS) ×2 IMPLANT
EVACUATOR SILICONE 100CC (DRAIN) ×2 IMPLANT
GAUZE SPONGE 2X2 8PLY STRL LF (GAUZE/BANDAGES/DRESSINGS) IMPLANT
GLOVE BIO SURGEON STRL SZ 6.5 (GLOVE) ×2 IMPLANT
GLOVE BIOGEL M STRL SZ7.5 (GLOVE) ×4 IMPLANT
GOWN STRL REUS W/TWL LRG LVL3 (GOWN DISPOSABLE) ×6 IMPLANT
IRRIG SUCT STRYKERFLOW 2 WTIP (MISCELLANEOUS)
IRRIGATION SUCT STRKRFLW 2 WTP (MISCELLANEOUS) IMPLANT
KIT BASIN (CUSTOM PROCEDURE TRAY) ×2 IMPLANT
KIT TURNOVER KIT A (KITS) IMPLANT
NS IRRIG 1000ML POUR BTL (IV SOLUTION) ×2 IMPLANT
PENCIL SMOKE EVACUATOR (MISCELLANEOUS) IMPLANT
PLUG CATH AND CAP STER (CATHETERS) ×2 IMPLANT
PROTECTOR NERVE ULNAR (MISCELLANEOUS) ×4 IMPLANT
RELOAD 45 VASCULAR/THIN (ENDOMECHANICALS) ×2 IMPLANT
RELOAD STAPLE 45 2.5 WHT GRN (ENDOMECHANICALS) IMPLANT
SEAL CANN UNIV 5-8 DVNC XI (MISCELLANEOUS) ×5 IMPLANT
SEAL XI 5MM-8MM UNIVERSAL (MISCELLANEOUS) ×10
SET IRRIG Y TYPE TUR BLADDER L (SET/KITS/TRAYS/PACK) ×2 IMPLANT
SET TUBE SMOKE EVAC HIGH FLOW (TUBING) ×2 IMPLANT
SOLUTION ELECTROLUBE (MISCELLANEOUS) ×2 IMPLANT
SPONGE GAUZE 2X2 STER 10/PKG (GAUZE/BANDAGES/DRESSINGS) ×1
SUT ETHILON 3 0 PS 1 (SUTURE) ×2 IMPLANT
SUT MNCRL AB 4-0 PS2 18 (SUTURE) ×4 IMPLANT
SUT PDS AB 1 CTX 36 (SUTURE) ×4 IMPLANT
SUT V-LOC BARB 180 2/0GR6 GS22 (SUTURE)
SUT VIC AB 0 CT1 27 (SUTURE) ×2
SUT VIC AB 0 CT1 27XBRD ANTBC (SUTURE) ×1 IMPLANT
SUT VICRYL 0 UR6 27IN ABS (SUTURE) ×2 IMPLANT
SUT VLOC BARB 180 ABS3/0GR12 (SUTURE) ×2
SUTURE V-LC BRB 180 2/0GR6GS22 (SUTURE) IMPLANT
SUTURE VLOC BRB 180 ABS3/0GR12 (SUTURE) ×1 IMPLANT
TOWEL OR NON WOVEN STRL DISP B (DISPOSABLE) ×2 IMPLANT
TRAY FOLEY MTR SLVR 16FR STAT (SET/KITS/TRAYS/PACK) ×2 IMPLANT
TRAY LAPAROSCOPIC (CUSTOM PROCEDURE TRAY) ×2 IMPLANT
TROCAR XCEL 12X100 BLDLESS (ENDOMECHANICALS) ×2 IMPLANT
WATER STERILE IRR 1000ML POUR (IV SOLUTION) ×4 IMPLANT

## 2020-03-22 NOTE — Anesthesia Postprocedure Evaluation (Signed)
Anesthesia Post Note  Patient: Adam Harris  Procedure(s) Performed: XI ROBOT ASSITED LAPAROSCOPIC NEPHROURETERECTOMY (Right )     Patient location during evaluation: PACU Anesthesia Type: General Level of consciousness: awake and alert, oriented and patient cooperative Pain management: pain level controlled Vital Signs Assessment: post-procedure vital signs reviewed and stable Respiratory status: spontaneous breathing, nonlabored ventilation, respiratory function stable and patient connected to nasal cannula oxygen Cardiovascular status: blood pressure returned to baseline and stable Postop Assessment: no apparent nausea or vomiting Anesthetic complications: no    Last Vitals:  Vitals:   03/22/20 1430 03/22/20 1519  BP: 126/89 (!) 141/79  Pulse: 67 69  Resp: 12 20  Temp: 36.7 C 36.8 C  SpO2: 96% 96%    Last Pain:  Vitals:   03/22/20 1523  TempSrc:   PainSc: 0-No pain                 Jerzi Tigert,E. Hisayo Delossantos

## 2020-03-22 NOTE — Progress Notes (Signed)
Urologic Surgery Post-op note  Subjective: The patient is doing well.  No complaints. No acute events since OR.  UOP picked up in PACU; thin cherry red.  Drowsy after phenergan.   Objective: Vital signs in last 24 hours: Temp:  [97.8 F (36.6 C)-98.3 F (36.8 C)] 98.3 F (36.8 C) (05/10 1519) Pulse Rate:  [50-83] 69 (05/10 1519) Resp:  [11-23] 20 (05/10 1519) BP: (113-153)/(79-102) 141/79 (05/10 1519) SpO2:  [94 %-100 %] 96 % (05/10 1519) Arterial Line BP: (150-154)/(80-84) 154/82 (05/10 1230) Weight:  [99.1 kg] 99.1 kg (05/10 1521)  Intake/Output from previous day: No intake/output data recorded. Intake/Output this shift: Total I/O In: 2400 [I.V.:2400] Out: 775 [Urine:575; Drains:100; Blood:100]  Physical Exam:  General: Alert and oriented. Abdomen: Soft, Nondistended.  GU: Foley in place with clear cherry red urine Incisions: Clean and dry. JP with SS output  Lab Results: Recent Labs    03/22/20 1214  HGB 15.8  HCT 48.8    Assessment/Plan: POD#0 from right robotic nephroureterectomy   1) Continue to monitor

## 2020-03-22 NOTE — Op Note (Signed)
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 Right robotic-assisted laparoscopic nephroureterectomy  Surgeon: [name]. M.D.   Resident: Dr. Sharlot Gowda   Anesthesia: General   Complications: None  EBL: 100  IVF: 1300 mL crystalloid   Specimens:  1. Right kidney, ureter, and bladder cuff  Disposition of specimens: Pathology   Drains:  1. # 15 Blake pelvic drain 2. 18 Fr Foley catheter  Indication:   Adam Harris is a 73 y.o. 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  After a thorough review of the management options, he elected to proceed with surgical treatment and the above procedure. We have discussed the potential benefits and risks of the procedure, side effects of the proposed treatment, the likelihood of the patient achieving the goals of the procedure, and any potential problems that might occur during the procedure or recuperation. Informed consent has been obtained.   Description of procedure:   The patient was taken to the operating room and a general anesthetic was administered. The patient was given preoperative antibiotics, placed in the right modified flank position with care to pad all potential pressure points, and prepped and draped in the usual sterile fashion. Next a preoperative timeout was performed.   A site was selected on the right side of the umbilicus for placement of the camera port. This was placed using a standard open Hassan technique which allowed entry into the peritoneal cavity under direct vision and without difficulty. A 12 mm port was placed and a pneumoperitoneum established. The camera was then used to inspect the abdomen and there was no evidence of any intra-abdominal injuries or other abnormalities. The remaining abdominal ports were then placed. 8 mm robotic ports were placed in the  right upper quadrant, right lower quadrant, and far right lateral abdominal wall. A 12 mm port was placed in the upper midline for laparoscopic assistance. All ports were placed under direct vision without difficulty.   Utilizing the cautery scissors, the white line of Toldt was incised allowing the colon to be mobilized medially and the plane between the mesocolon and the anterior layer of Gerota's fascia to be developed and the kidney to be exposed. The ureter and gonadal vein were identified inferiorly and the ureter was lifted anteriorly off the psoas muscle. Dissection proceeded superiorly along the gonadal vein until it entered the inferior vena cava. The gonadal vein was divided after ligation with multiple Weck clips. The renal hilum was then encountered. There was a single renal artery and vein.  Gerota's fascia was then intentionally entered superiorly to spare the adrenal gland and the hepatorenal ligaments were divided. The lateral attachments to the kidney were then divided allowing the kidney to be freely mobile. Dissection then proceeded inferiorly along the ureter toward the pelvis. The gonadal vein had been ligated superiorly at its insertion into the IVC and was again ligated with Weck clips distally and divided. The ureter was dissected free down the the common iliac vessels.   At this point attention returned to the renal fossa. Hemostasis was ensured.  Attention then focused on the pelvic dissection. The robotic cart was undocked. An additional 8 mm robotic port was placed in the lower portion of the left abdomen and the cart was redocked. The ureter was further dissected freely into the pelvis after the overlying peritoneum was incised and the small vessels feeding the ureter  was ligated with bipolar energy. A Weck clip was placed on the ureter distally to avoid any risk of tumor spillage. The detrusor muscle fibers were divided and the mucosa could be visualized. A 3-0 V-lock suture was  secured at the lateral side of the margin of resection in the bladder. The mucosa was then entered and the ureter and a surrounding bladder cuff were removed. The cystotomy was then closed with the v-lock suture in a running fashion with a second imbricating layer as well. The bladder was then filled with saline and there was no evidence for urine leak.   A # 15 Blake drain was then brought through the lateral lower port site and positioned in the perivesical space. The specimen was placed in a 12 mm retrieval bag and later removed through an upper midline incision extending from the 12 mm port site. The midline extraction site was closed with two running # 1 PDS sutures. All other laparoscopic/robotic ports were removed under direct vision and the pneumoperitoneum let down with inspection of the operative field performed and hemostasis again confirmed. All incision sites were then injected with local anesthetic and reapproximated at the skin level with 4-0 monocryl subcuticular closures. Dermabond was applied to the skin. The patient tolerated the procedure well and without complications. The patient was able to be extubated and transferred to the recovery unit in satisfactory condition.    [name] MD

## 2020-03-22 NOTE — Interval H&P Note (Signed)
History and Physical Interval Note:  03/22/2020 6:53 AM  Adam Harris  has presented today for surgery, with the diagnosis of UROTHELIAL CARCINOMA OF RIGHT RENAL PELVIS AND URETER.  The various methods of treatment have been discussed with the patient and family. After consideration of risks, benefits and other options for treatment, the patient has consented to  Procedure(s): XI ROBOT ASSITED LAPAROSCOPIC NEPHROURETERECTOMY (Right) as a surgical intervention.  The patient's history has been reviewed, patient examined, no change in status, stable for surgery.  I have reviewed the patient's chart and labs.  Questions were answered to the patient's satisfaction.     [employer]

## 2020-03-22 NOTE — Anesthesia Procedure Notes (Signed)
Procedure Name: Intubation Date/Time: 03/22/2020 7:29 AM Performed by: Montel Clock, CRNA Pre-anesthesia Checklist: Patient identified, Emergency Drugs available, Suction available, Patient being monitored and Timeout performed Patient Re-evaluated:Patient Re-evaluated prior to induction Oxygen Delivery Method: Circle system utilized Preoxygenation: Pre-oxygenation with 100% oxygen Induction Type: IV induction Ventilation: Mask ventilation without difficulty and Oral airway inserted - appropriate to patient size Laryngoscope Size: Mac and 3 Grade View: Grade I Tube type: Oral Tube size: 7.5 mm Number of attempts: 1 Airway Equipment and Method: Stylet Placement Confirmation: ETT inserted through vocal cords under direct vision,  positive ETCO2 and breath sounds checked- equal and bilateral Secured at: 23 cm Tube secured with: Tape Dental Injury: Teeth and Oropharynx as per pre-operative assessment

## 2020-03-22 NOTE — Transfer of Care (Signed)
Immediate Anesthesia Transfer of Care Note  Patient: Court Gracia  Procedure(s) Performed: XI ROBOT ASSITED LAPAROSCOPIC NEPHROURETERECTOMY (Right )  Patient Location: PACU  Anesthesia Type:General  Level of Consciousness: drowsy and patient cooperative  Airway & Oxygen Therapy: Patient Spontanous Breathing and Patient connected to face mask oxygen  Post-op Assessment: Report given to RN and Post -op Vital signs reviewed and stable  Post vital signs: Reviewed and stable  Last Vitals:  Vitals Value Taken Time  BP 138/87 03/22/20 1153  Temp    Pulse 88 03/22/20 1156  Resp 13 03/22/20 1155  SpO2 100 % 03/22/20 1156  Vitals shown include unvalidated device data.  Last Pain:  Vitals:   03/22/20 0542  TempSrc: Oral         Complications: No apparent anesthesia complications

## 2020-03-22 NOTE — Progress Notes (Signed)
Hand Irrigated Foley catheter with 120 NS per Dr. [name] request, patient tolerated well, no resistance with irrigation. 175cc bloody clear urine returned. Two small clots noted. MD Borden notified, no new orders will continue to monitor. MD [name] to see patient when finished in the OR.

## 2020-03-22 NOTE — Progress Notes (Signed)
Patient ID: Adam Harris, male   DOB: [birth year], 74 y.o.   MRN: [id number]  Post-op note  Subjective: The patient is doing well.  No complaints.  Objective: Vital signs in last 24 hours: Temp:  [97.8 F (36.6 C)-98.3 F (36.8 C)] 98.3 F (36.8 C) (05/10 1519) Pulse Rate:  [50-83] 69 (05/10 1519) Resp:  [11-23] 20 (05/10 1519) BP: (113-153)/(79-102) 141/79 (05/10 1519) SpO2:  [94 %-100 %] 96 % (05/10 1519) Arterial Line BP: (150-154)/(80-84) 154/82 (05/10 1230) Weight:  [99.1 kg] 99.1 kg (05/10 1521)  Intake/Output from previous day: No intake/output data recorded. Intake/Output this shift: Total I/O In: 2400 [I.V.:2400] Out: 775 [Urine:575; Drains:100; Blood:100]  Physical Exam:  General: Alert and oriented. Abdomen: Soft, Nondistended. Incisions: Clean and dry. GU: Urine pink and draining.  Lab Results: Recent Labs    03/22/20 1214  HGB 15.8  HCT 48.8    Assessment/Plan: Adam Harris   1) Continue to monitor, ambulate, IS   [name]. MD   LOS: 0 days   [name] 03/22/2020, 4:32 PM

## 2020-03-23 LAB — BASIC METABOLIC PANEL
Anion gap: 7 (ref 5–15)
Anion gap: 6 (ref 5–15)
BUN: 38 mg/dL — ABNORMAL HIGH (ref 8–23)
BUN: 39 mg/dL — ABNORMAL HIGH (ref 8–23)
CO2: 24 mmol/L (ref 22–32)
CO2: 26 mmol/L (ref 22–32)
Calcium: 8 mg/dL — ABNORMAL LOW (ref 8.9–10.3)
Calcium: 8.5 mg/dL — ABNORMAL LOW (ref 8.9–10.3)
Chloride: 102 mmol/L (ref 98–111)
Chloride: 103 mmol/L (ref 98–111)
Creatinine, Ser: 2.43 mg/dL — ABNORMAL HIGH (ref 0.61–1.24)
Creatinine, Ser: 2.63 mg/dL — ABNORMAL HIGH (ref 0.61–1.24)
GFR calc Af Amer: 29 mL/min — ABNORMAL LOW (ref >60)
GFR calc Af Amer: 27 mL/min — ABNORMAL LOW (ref >60)
GFR calc non Af Amer: 25 mL/min — ABNORMAL LOW (ref >60)
GFR calc non Af Amer: 23 mL/min — ABNORMAL LOW (ref >60)
Glucose, Bld: 187 mg/dL — ABNORMAL HIGH (ref 70–99)
Glucose, Bld: 158 mg/dL — ABNORMAL HIGH (ref 70–99)
Potassium: 4.7 mmol/L (ref 3.5–5.1)
Potassium: 4.7 mmol/L (ref 3.5–5.1)
Sodium: 133 mmol/L — ABNORMAL LOW (ref 135–145)
Sodium: 135 mmol/L (ref 135–145)

## 2020-03-23 LAB — HEMOGLOBIN AND HEMATOCRIT, BLOOD
HCT: 37.6 % — ABNORMAL LOW (ref 39.0–52.0)
HCT: 39.3 % (ref 39.0–52.0)
Hemoglobin: 12.2 g/dL — ABNORMAL LOW (ref 13.0–17.0)
Hemoglobin: 12.7 g/dL — ABNORMAL LOW (ref 13.0–17.0)

## 2020-03-23 LAB — GLUCOSE, CAPILLARY
Glucose-Capillary: 131 mg/dL — ABNORMAL HIGH (ref 70–99)
Glucose-Capillary: 168 mg/dL — ABNORMAL HIGH (ref 70–99)
Glucose-Capillary: 160 mg/dL — ABNORMAL HIGH (ref 70–99)
Glucose-Capillary: 203 mg/dL — ABNORMAL HIGH (ref 70–99)

## 2020-03-23 MED ORDER — LIDOCAINE 5 % EX PTCH
1.0000 | MEDICATED_PATCH | Freq: Every day | CUTANEOUS | Status: DC
  Administered 2020-03-23: 1 via TRANSDERMAL
  Filled 2020-03-23: qty 1

## 2020-03-23 MED ORDER — TRAMADOL HCL 50 MG PO TABS
50.0000 mg | ORAL_TABLET | Freq: Two times a day (BID) | ORAL | Status: DC | PRN
  Administered 2020-03-23: 50 mg via ORAL
  Filled 2020-03-23: qty 1

## 2020-03-23 MED ORDER — TRAMADOL HCL 50 MG PO TABS: 50.0000 mg | ORAL_TABLET | Freq: Four times a day (QID) | ORAL | Status: DC | PRN

## 2020-03-23 MED ORDER — SODIUM CHLORIDE 0.45 % IV SOLN: INTRAVENOUS | Status: DC

## 2020-03-23 NOTE — Progress Notes (Addendum)
Patient ID: Adam Harris, male   DOB: 01-19-1946, 74 y.o.   MRN: [id number]  1 Day Post-Op Subjective: Pt overall doing well.  Pain controlled. He was not ambulated last night for unclear reasons.  Drain stitch replaced last night.  Objective: Vital signs in last 24 hours: Temp:  [97.8 F (36.6 C)-98.5 F (36.9 C)] 98 F (36.7 C) (05/11 0452) Pulse Rate:  [50-88] 66 (05/11 0452) Resp:  [11-23] 18 (05/11 0452) BP: (113-149)/(72-102) 115/72 (05/11 0452) SpO2:  [94 %-100 %] 97 % (05/11 0452) Arterial Line BP: (150-154)/(80-84) 154/82 (05/10 1230) Weight:  [99.1 kg] 99.1 kg (05/10 1521)  Intake/Output from previous day: 05/10 0701 - 05/11 0700 In: 6062.3 [P.O.:2280; I.V.:3782.3] Out: 2835 [Urine:2345; Drains:390; Blood:100] Intake/Output this shift: No intake/output data recorded.  Physical Exam:  General: Alert and oriented CV: Irregular Lungs: Clear Abdomen: Soft, ND, positive BS Incisions: C/D/I Ext: NT, No erythema  Lab Results: Recent Labs    03/22/20 1214 03/23/20 0519  HGB 15.8 12.2*  HCT 48.8 37.6*   BMET Recent Labs    03/22/20 1214 03/23/20 0519  NA 140 133*  K 4.6 4.7  CL 106 102  CO2 24 24  GLUCOSE 161* 187*  BUN 32* 38*  CREATININE 2.10* 2.43*  CALCIUM 9.0 8.0*     Studies/Results: Path pending  Assessment/Plan: POD # 1 s/p right RAL nephroureterectomy - Ambulate, IS, DVT prophylaxis - Advance diet as tolerated, monitor BG levels and continue SSI - Monitor renal function and H/H (suspect decrease in H/H due to hemodilution based on excellent UOP and hemodynamic instability).  Recheck labs this afternoon. - Path pending - Oral pain medication - Continue to hold anticoagulation - Continue IVF at 50 cc/hr and monitor UOP and drain output   LOS: 1 day   [name] 03/23/2020, 7:22 AM

## 2020-03-23 NOTE — Progress Notes (Signed)
Patient ID: Jacksyn Beeks, male   DOB: [birth year], 74 y.o.   MRN: [id number]  1 Day Post-Op Subjective: No issues today.  Does report some right shoulder discomfort, likely positional or insufflation related. Has walked twice in the hallways. Tolerating diet without nausea and emesis. Pain well controlled. Urine output adequate today. JP output 160 during day shift. Afternoon labs with creatinine of 2.6.  Hemoglobin stable at 12.7.  Objective: Vital signs in last 24 hours: Temp:  [97.5 F (36.4 C)-98.5 F (36.9 C)] 97.6 F (36.4 C) (05/11 1318) Pulse Rate:  [53-88] 53 (05/11 1318) Resp:  [18-20] 20 (05/11 1318) BP: (115-149)/(72-102) 134/85 (05/11 1318) SpO2:  [96 %-98 %] 97 % (05/11 1318)  Intake/Output from previous day: 05/10 0701 - 05/11 0700 In: 6062.3 [P.O.:2280; I.V.:3782.3] Out: 2835 [Urine:2345; Drains:390; Blood:100] Intake/Output this shift: Total I/O In: 690 [P.O.:240; I.V.:450] Out: 960 [Urine:800; Drains:160]  Physical Exam:  General: Alert and oriented Abdomen: Soft, ND Incisions: C/D/I Ext: NT, No erythema  Lab Results: Recent Labs    03/22/20 1214 03/23/20 0519 03/23/20 1323  HGB 15.8 12.2* 12.7*  HCT 48.8 37.6* 39.3   BMET Recent Labs    03/23/20 0519 03/23/20 1323  NA 133* 135  K 4.7 4.7  CL 102 103  CO2 24 26  GLUCOSE 187* 158*  BUN 38* 39*  CREATININE 2.43* 2.63*  CALCIUM 8.0* 8.5*     Studies/Results: Path pending  Assessment/Plan: POD # 1 s/p right RAL nephroureterectomy  - Multimodal analgesia. Orals. - Ambulate, IS, DVT prophylaxis - Advance diet as tolerated, monitor BG levels and continue SSI - Monitor renal function and H/H (suspect decrease in H/H due to hemodilution based on excellent UOP and hemodynamic instability).  Recheck labs in AM - Check AM JP Cr - Path pending - Continue to hold anticoagulation - Continue IVF at 50 cc/hr and monitor UOP and drain output   LOS: 1 day   Nancee Liter 03/23/2020, 6:18  PM

## 2020-03-23 NOTE — Progress Notes (Signed)
Pt JP drain was leaking a fairly large amount of blood from site. Upon assessment, stitch was pulled out of skin and was no longer holding the drain in place. On call urologist Fredderick Phenix, MD notified and came to bed side to replace the stitch. Verbal orders to restart fluids at 100ml/h at 0400 also given. Pt has no concerns or complaints and tolerated the procedure very well. Will continue to monitor.

## 2020-03-24 LAB — BASIC METABOLIC PANEL
Anion gap: 6 (ref 5–15)
BUN: 42 mg/dL — ABNORMAL HIGH (ref 8–23)
CO2: 24 mmol/L (ref 22–32)
Calcium: 8.1 mg/dL — ABNORMAL LOW (ref 8.9–10.3)
Chloride: 104 mmol/L (ref 98–111)
Creatinine, Ser: 2.51 mg/dL — ABNORMAL HIGH (ref 0.61–1.24)
GFR calc Af Amer: 28 mL/min — ABNORMAL LOW (ref >60)
GFR calc non Af Amer: 24 mL/min — ABNORMAL LOW (ref >60)
Glucose, Bld: 129 mg/dL — ABNORMAL HIGH (ref 70–99)
Potassium: 4.5 mmol/L (ref 3.5–5.1)
Sodium: 134 mmol/L — ABNORMAL LOW (ref 135–145)

## 2020-03-24 LAB — GLUCOSE, CAPILLARY: Glucose-Capillary: 107 mg/dL — ABNORMAL HIGH (ref 70–99)

## 2020-03-24 LAB — CREATININE, FLUID (PLEURAL, PERITONEAL, JP DRAINAGE): Creat, Fluid: 2.6 mg/dL

## 2020-03-24 LAB — HEMOGLOBIN AND HEMATOCRIT, BLOOD
HCT: 34.9 % — ABNORMAL LOW (ref 39.0–52.0)
Hemoglobin: 11.3 g/dL — ABNORMAL LOW (ref 13.0–17.0)

## 2020-03-24 NOTE — Discharge Instructions (Signed)
1.  Activity:  You are encouraged to ambulate frequently (about every hour during waking hours) to help prevent blood clots from forming in your legs or lungs.  However, you should not engage in any heavy lifting (> 10-15 lbs), strenuous activity, or straining. 2. Diet: You should advance your diet as instructed by your physician.  It will be normal to have some bloating, nausea, and abdominal discomfort intermittently. 3. Prescriptions:  You will be provided a prescription for pain medication to take as needed.  If your pain is not severe enough to require the prescription pain medication, you may take extra strength Tylenol instead which will have less side effects.  You should also take a prescribed stool softener to avoid straining with bowel movements as the prescription pain medication may constipate you. 4. Incisions: You may remove your dressing bandages 48 hours after surgery if not removed in the hospital.  You will either have some small staples or special tissue glue at each of the incision sites. Once the bandages are removed (if present), the incisions may stay open to air.  You may start showering (but not soaking or bathing in water) the 2nd day after surgery and the incisions simply need to be patted dry after the shower.  No additional care is needed. 5. What to call us about: You should call the office [phone]) if you develop fever > 101 or develop persistent vomiting. 6.   If you have a catheter, you will be taught how to take care of the catheter by the nursing staff prior to discharge from the hospital.  You may periodically feel a strong urge to void with the catheter in place.  This is a bladder spasm and most often can occur when having a bowel movement or moving around. It is typically self-limited and usually will stop after a few minutes.  You may use some Vaseline or Neosporin around the tip of the catheter to reduce friction at the tip of the penis. You may also see some  blood in the urine.  A very small amount of blood can make the urine look quite red.  As long as the catheter is draining well, there usually is not a problem.  However, if the catheter is not draining well and is bloody, you should call the office [phone]) to notify us.       7. YOU MAY RESTART YOUR ELIQUIS ON Friday MORNING.

## 2020-03-24 NOTE — Progress Notes (Signed)
Patient ID: Adam Harris, male   DOB: 10/01/1946, 74 y.o.   MRN: [id number]  2 Days Post-Op Subjective: Pt doing well.  Ambulating well.  Passing flatus.  Tolerating diet.   Objective: Vital signs in last 24 hours: Temp:  [97.5 F (36.4 C)-99.2 F (37.3 C)] 98.2 F (36.8 C) (05/12 0509) Pulse Rate:  [53-88] 68 (05/12 0509) Resp:  [14-20] 16 (05/12 0509) BP: (113-134)/(71-85) 113/75 (05/12 0509) SpO2:  [91 %-98 %] 91 % (05/12 0509)  Intake/Output from previous day: 05/11 0701 - 05/12 0700 In: 1150 [P.O.:700; I.V.:450] Out: 3330 [Urine:3100; Drains:230] Intake/Output this shift: No intake/output data recorded.  Physical Exam:  General: Alert and oriented CV: Irregular Lungs: Clear Abdomen: Soft, ND, positive BS, Drain with serosanguinous drainage Incisions: C/D/I Ext: NT, No erythema  Lab Results: Recent Labs    03/23/20 0519 03/23/20 1323 03/24/20 0458  HGB 12.2* 12.7* 11.3*  HCT 37.6* 39.3 34.9*   BMET Recent Labs    03/23/20 1323 03/24/20 0458  NA 135 134*  K 4.7 4.5  CL 103 104  CO2 26 24  GLUCOSE 158* 129*  BUN 39* 42*  CREATININE 2.63* 2.51*  CALCIUM 8.5* 8.1*     Studies/Results: No results found.  Assessment/Plan: POD # 2 s/p right RAL nephroureterectomy - SL IVF - Check drain Cr - D/C home with catheter - Renal function stabilized - Path pending   LOS: 2 days   [name] 03/24/2020, 7:13 AM

## 2020-03-24 NOTE — Progress Notes (Signed)
All discharge informations including follow-up, foley care, medications and prescription med reviewed with patient. IVs removed and all patient belonging returned to patient. No further question ask. Patient waiting to be picked up by wife.

## 2020-03-24 NOTE — Discharge Summary (Signed)
[facility] Discharge Summary  Admit date: 03/22/2020  Discharge date and time: 03/24/20   Discharge to: Home  Discharge Service: Urology  Discharge Attending Physician:  Dr. [name]  Discharge  Diagnoses: Urothelial carcinoma  Secondary Diagnosis: Active Problems:   Urothelial carcinoma ([place])   OR Procedures: Procedure(s): XI ROBOT ASSITED LAPAROSCOPIC NEPHROURETERECTOMY 03/22/2020   Ancillary Procedures: None   Discharge Day Services: The patient was seen and examined by the Urology team both in the morning and immediately prior to discharge.  Vital signs and laboratory values were stable and within normal limits. JP Cr was consistent with serum and therefore removed. The physical exam was benign and unchanged and all surgical wounds were examined.  Discharge instructions were explained and all questions answered.  Subjective  No acute events overnight. Pain Controlled. No fever or chills.  Objective Patient Vitals for the past 8 hrs:  BP Temp Temp src Pulse Resp SpO2  03/24/20 0509 113/75 98.2 F (36.8 C) Oral 68 16 91 %  03/24/20 0206 113/73 98.4 F (36.9 C) Oral 67 14 91 %   No intake/output data recorded.  General Appearance:        No acute distress Lungs:                       Normal work of breathing on room air Heart:                                Regular rate and rhythm Abdomen:                         Soft, appropriately-tender, non-distended Extremities:                      Warm and well perfused GU:        Foley draining clear yellow urine.    Hospital Course:  The patient underwent robotic right nephroureterectomy with bladder cuff on 03/22/2020.  The patient tolerated the procedure well, was extubated in the OR, and afterwards was taken to the PACU for routine post-surgical care. When stable the patient was transferred to the floor.   The patient did well postoperatively. JP Drain was consistent with serum on POD2 and therefore removed. The  patient's diet was slowly advanced and at the time of discharge was tolerating a regular diet.  The patient was discharged home 2 Days Post-Op, at which point was tolerating a regular solid diet, was able to care for his catheter, have adequate pain control with P.O. pain medication, and could ambulate without difficulty. The patient will follow up with [place] for post op chec with cystogram.   He will resume eliquis tomorrow.   Condition at Discharge: Improved  Discharge Medications:  Allergies as of 03/24/2020      Reactions   Xarelto [rivaroxaban] Other (See Comments)   Skin Blisters       Medication List    STOP taking these medications   Eliquis 5 MG Tabs tablet Generic drug: apixaban     TAKE these medications   diphenhydramine-acetaminophen 25-500 MG Tabs tablet Commonly known as: TYLENOL PM Take 1 tablet by mouth at bedtime as needed (sleep.).   furosemide 20 MG tablet Commonly known as: LASIX TAKE ONE TABLET BY MOUTH DAILY . MAY TAKE EXTRA TABLET IF NEEDED FOR SWELLING What changed: See the new instructions.   isosorbide  mononitrate 30 MG 24 hr tablet Commonly known as: IMDUR TAKE ONE TABLET BY MOUTH DAILY   metoprolol tartrate 25 MG tablet Commonly known as: LOPRESSOR Take 25 mg by mouth 2 (two) times daily.   metoprolol tartrate 50 MG tablet Commonly known as: LOPRESSOR Take 1 tablet (50 mg total) by mouth 2 (two) times daily.   nitroGLYCERIN 0.4 MG SL tablet Commonly known as: NITROSTAT PLACE ONE TABLET UNDER THE TONGUE EVERY 5 MINUTES TIMES THREE DOSES AS NEEDED FOR CHEST PAIN. CALL 911 IF 2ND DOSE DOES NOT HELP What changed:   how much to take  how to take this  when to take this  reasons to take this  additional instructions   Olmesartan-amLODIPine-HCTZ 40-10-12.5 MG Tabs TAKE HALF TABLET BY MOUTH DAILY   phenazopyridine 200 MG tablet Commonly known as: Pyridium Take 1 tablet (200 mg total) by mouth 3 (three) times daily as needed for pain.    rosuvastatin 40 MG tablet Commonly known as: CRESTOR Take 1 tablet (40 mg total) by mouth every morning. What changed: when to take this   sitaGLIPtin 100 MG tablet Commonly known as: Januvia Take 1 tablet (100 mg total) by mouth daily.   sulfamethoxazole-trimethoprim 800-160 MG tablet Commonly known as: BACTRIM DS Take 1 tablet by mouth 2 (two) times daily. Begin on Friday morning (03/26/20)   traMADol 50 MG tablet Commonly known as: ULTRAM Take 1-2 tablets (50-100 mg total) by mouth every 6 (six) hours as needed (pain).

## 2020-03-25 ENCOUNTER — Other Ambulatory Visit: Payer: Self-pay

## 2020-03-25 ENCOUNTER — Encounter (HOSPITAL_BASED_OUTPATIENT_CLINIC_OR_DEPARTMENT_OTHER): Payer: Self-pay

## 2020-03-25 ENCOUNTER — Emergency Department (HOSPITAL_BASED_OUTPATIENT_CLINIC_OR_DEPARTMENT_OTHER)
Admission: EM | Admit: 2020-03-25 | Discharge: 2020-03-25 | Disposition: A | Discharge status: Home / Self Care | Payer: Medicare Other | Attending: Emergency Medicine | Admitting: Emergency Medicine

## 2020-03-25 DIAGNOSIS — N183 Chronic kidney disease, stage 3 unspecified: Secondary | ICD-10-CM | POA: Insufficient documentation

## 2020-03-25 DIAGNOSIS — I2510 Atherosclerotic heart disease of native coronary artery without angina pectoris: Secondary | ICD-10-CM | POA: Insufficient documentation

## 2020-03-25 DIAGNOSIS — Z7984 Long term (current) use of oral hypoglycemic drugs: Secondary | ICD-10-CM | POA: Insufficient documentation

## 2020-03-25 DIAGNOSIS — E1122 Type 2 diabetes mellitus with diabetic chronic kidney disease: Secondary | ICD-10-CM | POA: Diagnosis not present

## 2020-03-25 DIAGNOSIS — Z8616 Personal history of COVID-19: Secondary | ICD-10-CM | POA: Insufficient documentation

## 2020-03-25 DIAGNOSIS — M1180 Other specified crystal arthropathies, unspecified site: Secondary | ICD-10-CM

## 2020-03-25 DIAGNOSIS — M11861 Other specified crystal arthropathies, right knee: Secondary | ICD-10-CM | POA: Diagnosis not present

## 2020-03-25 DIAGNOSIS — Z79899 Other long term (current) drug therapy: Secondary | ICD-10-CM | POA: Insufficient documentation

## 2020-03-25 DIAGNOSIS — M25561 Pain in right knee: Secondary | ICD-10-CM | POA: Diagnosis not present

## 2020-03-25 DIAGNOSIS — I5032 Chronic diastolic (congestive) heart failure: Secondary | ICD-10-CM | POA: Diagnosis not present

## 2020-03-25 DIAGNOSIS — Z87891 Personal history of nicotine dependence: Secondary | ICD-10-CM | POA: Diagnosis not present

## 2020-03-25 DIAGNOSIS — I130 Hypertensive heart and chronic kidney disease with heart failure and stage 1 through stage 4 chronic kidney disease, or unspecified chronic kidney disease: Secondary | ICD-10-CM | POA: Insufficient documentation

## 2020-03-25 LAB — GRAM STAIN

## 2020-03-25 LAB — SURGICAL PATHOLOGY

## 2020-03-25 LAB — SYNOVIAL CELL COUNT + DIFF, W/ CRYSTALS
Eosinophils-Synovial: 0 % (ref 0–1)
Lymphocytes-Synovial Fld: 3 % (ref 0–20)
Monocyte-Macrophage-Synovial Fluid: 4 % — ABNORMAL LOW (ref 50–90)
Neutrophil, Synovial: 93 % — ABNORMAL HIGH (ref 0–25)
WBC, Synovial: 30000 /mm3 — ABNORMAL HIGH (ref 0–200)

## 2020-03-25 MED ORDER — PREDNISONE 10 MG PO TABS: 40.0000 mg | ORAL_TABLET | Freq: Every day | ORAL | 0 refills | Status: DC

## 2020-03-25 MED ORDER — OXYCODONE-ACETAMINOPHEN 5-325 MG PO TABS
1.0000 | ORAL_TABLET | Freq: Once | ORAL | Status: AC
  Administered 2020-03-25: 1 via ORAL
  Filled 2020-03-25: qty 1

## 2020-03-25 MED ORDER — LIDOCAINE HCL (PF) 1 % IJ SOLN
5.0000 mL | Freq: Once | INTRAMUSCULAR | Status: AC
  Administered 2020-03-25: 5 mL via INTRADERMAL
  Filled 2020-03-25: qty 5

## 2020-03-25 MED ORDER — PREDNISONE 10 MG PO TABS: 40.0000 mg | ORAL_TABLET | Freq: Every day | ORAL | 0 refills | Status: AC

## 2020-03-25 NOTE — ED Notes (Signed)
PT's WIFE CAN BE REACHED AT [phone]

## 2020-03-25 NOTE — ED Provider Notes (Signed)
[name] Note   CSN: [id number] Arrival date & time: 03/25/20  1449     History Chief Complaint  Patient presents with  . Joint Swelling    Adam Harris is a 74 y.o. male.  HPI     Presents with right knee pain Had nephrectomy 3 days ago Last night at 2AM woke with pain If not moving doesn't hurt but if trying to move or walk it is severe pain No fevers No injuries/falls/twisting of knee No n/v/abd pain Will be starting abx on Monday after his surgery No cp/dyspnea/no calf pain Similar pain to what he had in January Played football when he was young  Past Medical History:  Diagnosis Date  . Abnormal radiologic findings on diagnostic imaging of renal pelvis, ureter, or bladder    bilateral ureter abnormalities  . Anticoagulant long-term use    eliquis  . Anxiety   . Arthritis   . CAD (coronary artery disease) cardiologist-- dr hochrein   NSTEMI 02-04-2014  per cardiac cath chronic occluded RCA w/ faint left-to-right collaterals and aneurysmal LCFx with sluggish coronary flow/   NSTEMI --11-21-2016 per cardiac cath occluded proximal RCA & mid to diastal CFX 100%, med rx. If that does not work, PTCA or CABG  . Chronic diastolic heart failure ([place])   . CKD (chronic kidney disease), stage III    patient unaware  . DOE (dyspnea on exertion)    2-3 flights of stairs  . Dysrhythmia 2019   A-Fib  . Fatty liver    pt denies  . Hematuria   . Hematuria 02/2019  . History of COVID-19 10/2019  . History of non-ST elevation myocardial infarction (NSTEMI)    02-04-2014  and 11-21-2016  cardiac cath done both times ,  medically management  . History of shingles 12/2017   slight pain and numbness still noted in the area  . Hyperlipidemia   . Hypertension   . Insomnia   . Myocardial infarction ([place]) 2015  . Persistent atrial fibrillation [facility])    cardiologsit-- dr Percival Spanish  . Sciatica   . Thoracic aortic atherosclerosis ([place])   .  Type 2 diabetes mellitus ([place])   . Urinary frequency     Patient Active Problem List   Diagnosis Date Noted  . 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 Educated about COVID-19 virus infection 10/28/2019  . Shingles 01/10/2018  . Chronic diastolic CHF (congestive heart failure), NYHA class 2 ([place]) 04/02/2017  . Atrial fibrillation with RVR ([place]) 11/20/2016  . Degenerative arthritis of knee, bilateral 05/31/2016  . Low back pain 05/31/2016  . Routine general medical examination at a health care facility 12/25/2014  . Exertional angina ([place]) 10/29/2014  . Atrial fibrillation, new onset ([place]) 10/29/2014  . Chronic renal disease, stage III 10/29/2014  . History of coronary artery stent placement 07/24/2014  . Coronary artery disease 03/09/2014  . Diabetes mellitus, type 2 ([place]) 02/17/2014  . Dyslipidemia 02/05/2014  . NSTEMI (non-ST elevated myocardial infarction) ([place]) 02/04/2014  . History of non-ST elevation myocardial infarction (NSTEMI) 02/03/2014  . Essential hypertension 02/03/2014  . Chest pain 02/03/2014  . Abnormal prostate specific antigen 04/19/2013  . Anxiety state 04/19/2013  . Increased frequency of urination 04/19/2013  . 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 Insomnia 04/19/2013  . Stiffness of joint, lower leg 04/19/2013  . Sciatica 04/19/2013  . Tension type headache 04/19/2013    Past Surgical History:  Procedure Laterality Date  . CARDIAC CATHETERIZATION N/A  11/21/2016   Procedure: Left Heart Cath and Coronary Angiography;  Surgeon: Troy Sine, MD;  Location: [facility];  Service: Cardiovascular;  Laterality: N/A;  pRCA 100% , ostial LAD 45%, OM3 80%, mCFX to dCFX 100% (AV groove), lateral OM3 50%  . COLONOSCOPY    . CYSTOSCOPY WITH URETEROSCOPY AND STENT PLACEMENT Right 12/15/2019   Procedure: CYSTOSCOPY WITH RIGHT RETROGRADE/ RIGHT URETEROSCOPY/ BIOPSY;  Surgeon: [name], MD;  Location: [facility];  Service: Urology;   Laterality: Right;  . CYSTOSCOPY/RETROGRADE/URETEROSCOPY Bilateral 03/18/2018   Procedure: CYSTOSCOPY/RETROGRADE/URETEROSCOPY.;  Surgeon: [name], MD;  Location: [facility];  Service: Urology;  Laterality: Bilateral;  . KNEE ARTHROSCOPY Right   . LEFT HEART CATHETERIZATION WITH CORONARY ANGIOGRAM N/A 02/04/2014   Procedure: LEFT HEART CATHETERIZATION WITH CORONARY ANGIOGRAM;  Surgeon: [name], MD;  Location: [facility] C[facility];  Service: Cardiovascular;  Laterality: N/A;  severe one-vessel CAD, chronically occluded RCA with faint left-to-right collaterals;  aneurysmal LCFx with sluggish coronary flow;  normal LVSF w/ moderately elevated LVEDP (ostialOM2 20%, pOM3 20%, pD1 20%, mCFX 50%, diffuse 20% pCFX)  . ROBOT ASSITED LAPAROSCOPIC NEPHROURETERECTOMY Right 03/22/2020   Procedure: XI ROBOT ASSITED LAPAROSCOPIC NEPHROURETERECTOMY;  Surgeon: [name], MD;  Location: [facility];  Service: Urology;  Laterality: Right;  . TRANSTHORACIC ECHOCARDIOGRAM  11-22-2016   dr hochrein   ef 55-60%/ mild MR and TR/ moderate LAE       Family History  Problem Relation Age of Onset  . Hypertension Mother     Social History   Tobacco Use  . Smoking status: Never Smoker  . Smokeless tobacco: Former Systems developer    Types: Chew  Substance Use Topics  . Alcohol use: No  . Drug use: No    Home Medications Prior to Admission medications   Medication Sig Start Date End Date Taking? Authorizing Provider  diphenhydramine-acetaminophen (TYLENOL PM) 25-500 MG TABS tablet Take 1 tablet by mouth at bedtime as needed (sleep.).     [provider]  furosemide (LASIX) 20 MG tablet TAKE ONE TABLET BY MOUTH DAILY . MAY TAKE EXTRA TABLET IF NEEDED FOR SWELLING Patient taking differently: Take 20 mg by mouth daily as needed for fluid.  11/21/19   Minus Breeding, MD  isosorbide mononitrate (IMDUR) 30 MG 24 hr tablet TAKE ONE TABLET BY MOUTH DAILY Patient taking differently: Take 30 mg by mouth  daily.  07/07/19   Minus Breeding, MD  metoprolol tartrate (LOPRESSOR) 25 MG tablet Take 25 mg by mouth 2 (two) times daily.    [provider]  metoprolol tartrate (LOPRESSOR) 50 MG tablet Take 1 tablet (50 mg total) by mouth 2 (two) times daily. Patient not taking: Reported on 03/09/2020 05/13/19 03/09/21  Minus Breeding, MD  nitroGLYCERIN (NITROSTAT) 0.4 MG SL tablet PLACE ONE TABLET UNDER THE TONGUE EVERY 5 MINUTES TIMES THREE DOSES AS NEEDED FOR CHEST PAIN. CALL 911 IF 2ND DOSE DOES NOT HELP Patient taking differently: Place 0.4 mg under the tongue every 5 (five) minutes x 3 doses as needed for chest pain. CALL 911 IF 2ND DOSE DOES NOT HELP 08/26/19   Minus Breeding, MD  Olmesartan-amLODIPine-HCTZ 40-10-12.5 MG TABS TAKE HALF TABLET BY MOUTH DAILY Patient taking differently: Take 0.5 tablets by mouth daily.  01/27/20   Minus Breeding, MD  phenazopyridine (PYRIDIUM) 200 MG tablet Take 1 tablet (200 mg total) by mouth 3 (three) times daily as needed for pain. Patient not taking: Reported on 03/09/2020 12/15/19   Karsten Ro,  Elta Guadeloupe, MD  predniSONE (DELTASONE) 10 MG tablet Take 4 tablets (40 mg total) by mouth daily for 5 days. 03/25/20 03/30/20  Gareth Morgan, MD  rosuvastatin (CRESTOR) 40 MG tablet Take 1 tablet (40 mg total) by mouth every morning. Patient taking differently: Take 40 mg by mouth daily.  01/26/20   Minus Breeding, MD  sitaGLIPtin (JANUVIA) 100 MG tablet Take 1 tablet (100 mg total) by mouth daily. 12/03/19   Cirigliano, Garvin Fila, DO  sulfamethoxazole-trimethoprim (BACTRIM DS) 800-160 MG tablet Take 1 tablet by mouth 2 (two) times daily. Begin on Friday morning (03/26/20) 03/22/20   [name], MD  traMADol (ULTRAM) 50 MG tablet Take 1-2 tablets (50-100 mg total) by mouth every 6 (six) hours as needed (pain). 03/22/20   [name], MD    Allergies    Xarelto [rivaroxaban]  Review of Systems   Review of Systems  Constitutional: Negative for fever.  HENT: Negative for sore  throat.   Respiratory: Negative for shortness of breath.   Cardiovascular: Negative for chest pain.  Gastrointestinal: Negative for abdominal pain, diarrhea, nausea and vomiting.  Genitourinary: Negative for difficulty urinating (catheter in place).  Musculoskeletal: Positive for arthralgias. Negative for neck stiffness.  Skin: Negative for rash.  Neurological: Negative for syncope.    Physical Exam Updated Vital Signs BP 127/83   Pulse 98   Temp 99 F (37.2 C) (Oral)   Resp 20   Ht 5\' 11"  (1.803 m)   Wt 98.9 kg   SpO2 95%   BMI 30.40 kg/m   Physical Exam Vitals and nursing note reviewed.  Constitutional:      General: He is not in acute distress.    Appearance: Normal appearance. He is not ill-appearing, toxic-appearing or diaphoretic.  HENT:     Head: Normocephalic.  Eyes:     Conjunctiva/sclera: Conjunctivae normal.  Cardiovascular:     Rate and Rhythm: Normal rate and regular rhythm.     Pulses: Normal pulses.  Pulmonary:     Effort: Pulmonary effort is normal. No respiratory distress.  Musculoskeletal:        General: Swelling present. No deformity or signs of injury. Tenderness: right knee with swelling/joint effusion, decreased ROM, pain with passive ROM, no erythema. mild warmth.     Cervical back: No rigidity.  Skin:    General: Skin is warm and dry.     Coloration: Skin is not jaundiced or pale.  Neurological:     General: No focal deficit present.     Mental Status: He is alert and oriented to person, place, and time.     ED Results / Procedures / Treatments   Labs (all labs ordered are listed, but only abnormal results are displayed) Labs Reviewed  SYNOVIAL CELL COUNT + DIFF, W/ CRYSTALS - Abnormal; Notable for the following components:      Result Value   Color, Synovial YELLOW (*)    Appearance-Synovial TURBID (*)    WBC, Synovial 30,000 (*)    Neutrophil, Synovial 93 (*)    Monocyte-Macrophage-Synovial Fluid 4 (*)    All other components  within normal limits  GRAM STAIN  CULTURE, BODY FLUID-BOTTLE  GLUCOSE, BODY FLUID OTHER  PROTEIN, BODY FLUID (OTHER)    EKG None  Radiology No results found.  Procedures Procedures (including critical care time)  Medications Ordered in ED Medications  oxyCODONE-acetaminophen (PERCOCET/ROXICET) 5-325 MG per tablet 1 tablet (1 tablet Oral Given 03/25/20 1542)  lidocaine (PF) (XYLOCAINE) 1 % injection 5 mL (5 mLs  Intradermal Given by Other 03/25/20 1543)    ED Course  I have reviewed the triage vital signs and the nursing notes.  Pertinent labs & imaging results that were available during my care of the patient were reviewed by me and considered in my medical decision making (see chart for details).    MDM Rules/Calculators/A&P                      74yo male with history above including recent nephrectomy, afib on eliquis, presents with concern for right knee swelling. Had similar swelling 12/08/2019 and presented to ED, appeared to be inflammatory arthritis, had arthrocentesis and prednisone and improved until now.  No hx of trauma, do not see indication for repeat XR today. Prior showed tricompartmental osteoarthritis.  Knee warmth with severe pain with passive ROM. Discussed risks and benefits of arthrocentesis and performed with 30cc straw colored fluid aspirated.  Fluid showed cell count 30000.  Intracellular calcium pyrophosphate and extracellular calcium pyrophosphate seen.  Gram stain with WBC, no organisms. Culture sent.  Given rx for prednisone for pseudogout. Has pain medications from recent nephrectomy that he may also use for this. Recommend follow up with his PCP.   Final Clinical Impression(s) / ED Diagnoses Final diagnoses:  Calcium pyrophosphate crystal arthritis  Acute pain of right knee    Rx / DC Orders ED Discharge Orders         Ordered    predniSONE (DELTASONE) 10 MG tablet  Daily,   Status:  Discontinued     03/25/20 2014    predniSONE  (DELTASONE) 10 MG tablet  Daily     03/25/20 2028           Gareth Morgan, MD 03/26/20 0050

## 2020-03-25 NOTE — ED Notes (Signed)
Pt assisted with calling wife using our phone

## 2020-03-25 NOTE — ED Triage Notes (Signed)
Pt c/o R knee pain and swelling that started last PM. Pt states similar thing happened in the past where he had to have fluid removed and to be placed on abx.

## 2020-03-26 DIAGNOSIS — C651 Malignant neoplasm of right renal pelvis: Secondary | ICD-10-CM | POA: Diagnosis not present

## 2020-03-26 LAB — PROTEIN, BODY FLUID (OTHER): Total Protein, Body Fluid Other: 3 g/dL

## 2020-03-26 LAB — GLUCOSE, BODY FLUID OTHER: Glucose, Body Fluid Other: 74 mg/dL

## 2020-03-30 LAB — CULTURE, BODY FLUID W GRAM STAIN -BOTTLE: Culture: NO GROWTH

## 2020-04-06 ENCOUNTER — Other Ambulatory Visit: Payer: Self-pay | Admitting: Cardiology

## 2020-04-20 DIAGNOSIS — C651 Malignant neoplasm of right renal pelvis: Secondary | ICD-10-CM | POA: Diagnosis not present

## 2020-05-15 ENCOUNTER — Other Ambulatory Visit: Payer: Self-pay | Admitting: Cardiology

## 2020-05-25 ENCOUNTER — Other Ambulatory Visit: Payer: Self-pay

## 2020-05-25 NOTE — Progress Notes (Deleted)
Subjective:   Adam Harris is a 74 y.o. male who presents for Medicare Annual/Subsequent preventive examination.  Review of Systems:         Objective:    Vitals: There were no vitals taken for this visit.  There is no height or weight on file to calculate BMI.  Advanced Directives 03/25/2020 03/22/2020 03/11/2020 12/15/2019 12/08/2019 02/25/2019 07/07/2018  Does Patient Have a Medical Advance Directive? No No No No No No No  Would patient like information on creating a medical advance directive? - No - Patient declined No - Patient declined Yes (MAU/Ambulatory/Procedural Areas - Information given) Yes (ED - Information included in AVS) - -  Pre-existing out of facility DNR order (yellow form or pink MOST form) - - - - - - -    Tobacco Social History   Tobacco Use  Smoking Status Never Smoker  Smokeless Tobacco Former Systems developer  . Types: Chew     Counseling given: Not Answered   Clinical Intake:                 Nutrition Risk Assessment:  Has the patient had any N/V/D within the last 2 months?  {YES/NO:21197} Does the patient have any non-healing wounds?  {YES/NO:21197} Has the patient had any unintentional weight loss or weight gain?  {YES/NO:21197}  Diabetes:  Is the patient diabetic?  {YES/NO:21197} If diabetic, was a CBG obtained today?  {YES/NO:21197} Did the patient bring in their glucometer from home?  {YES/NO:21197} How often do you monitor your CBG's? ***.   Financial Strains and Diabetes Management:  Are you having any financial strains with the device, your supplies or your medication? {YES/NO:21197}.  Does the patient want to be seen by Chronic Care Management for management of their diabetes?  {YES/NO:21197} Would the patient like to be referred to a Nutritionist or for Diabetic Management?  {YES/NO:21197}  Diabetic Exams:  Diabetic Eye Exam: Completed ***. Overdue for diabetic eye exam. Pt has been advised about the importance in completing this exam. A  referral has been placed today. Message sent to referral coordinator for scheduling purposes. Advised pt to expect a call from our office re: appt.  Diabetic Foot Exam: Completed ***. Pt has been advised about the importance in completing this exam. Pt is scheduled for diabetic foot exam on ***.         Past Medical History:  Diagnosis Date  . Abnormal radiologic findings on diagnostic imaging of renal pelvis, ureter, or bladder    bilateral ureter abnormalities  . Anticoagulant long-term use    eliquis  . Anxiety   . Arthritis   . CAD (coronary artery disease) cardiologist-- dr hochrein   NSTEMI 02-04-2014  per cardiac cath chronic occluded RCA w/ faint left-to-right collaterals and aneurysmal LCFx with sluggish coronary flow/   NSTEMI --11-21-2016 per cardiac cath occluded proximal RCA & mid to diastal CFX 100%, med rx. If that does not work, PTCA or CABG  . Chronic diastolic heart failure ([place])   . CKD (chronic kidney disease), stage III    patient unaware  . DOE (dyspnea on exertion)    2-3 flights of stairs  . Dysrhythmia 2019   A-Fib  . Fatty liver    pt denies  . Hematuria   . Hematuria 02/2019  . History of COVID-19 10/2019  . History of non-ST elevation myocardial infarction (NSTEMI)    02-04-2014  and 11-21-2016  cardiac cath done both times ,  medically management  . History of  shingles 12/2017   slight pain and numbness still noted in the area  . Hyperlipidemia   . Hypertension   . Insomnia   . Myocardial infarction ([place]) 2015  . Persistent atrial fibrillation [facility])    cardiologsit-- dr Percival Spanish  . Sciatica   . Thoracic aortic atherosclerosis ([place])   . Type 2 diabetes mellitus ([place])   . Urinary frequency    Past Surgical History:  Procedure Laterality Date  . CARDIAC CATHETERIZATION N/A 11/21/2016   Procedure: Left Heart Cath and Coronary Angiography;  Surgeon: Troy Sine, MD;  Location: [place] [facility];  Service: Cardiovascular;  Laterality: N/A;   pRCA 100% , ostial LAD 45%, OM3 80%, mCFX to dCFX 100% (AV groove), lateral OM3 50%  . COLONOSCOPY    . CYSTOSCOPY WITH URETEROSCOPY AND STENT PLACEMENT Right 12/15/2019   Procedure: CYSTOSCOPY WITH RIGHT RETROGRADE/ RIGHT URETEROSCOPY/ BIOPSY;  Surgeon: [name], MD;  Location: [place];  Service: Urology;  Laterality: Right;  . CYSTOSCOPY/RETROGRADE/URETEROSCOPY Bilateral 03/18/2018   Procedure: CYSTOSCOPY/RETROGRADE/URETEROSCOPY.;  Surgeon: [name], MD;  [facility];  Service: Urology;  Laterality: Bilateral;  . KNEE ARTHROSCOPY Right   . LEFT HEART CATHETERIZATION WITH CORONARY ANGIOGRAM N/A 02/04/2014   Procedure: LEFT HEART CATHETERIZATION WITH CORONARY ANGIOGRAM;  Surgeon: [name], MD;  Location: [place] [facility];  Service: Cardiovascular;  Laterality: N/A;  severe one-vessel CAD, chronically occluded RCA with faint left-to-right collaterals;  aneurysmal LCFx with sluggish coronary flow;  normal LVSF w/ moderately elevated LVEDP (ostialOM2 20%, pOM3 20%, pD1 20%, mCFX 50%, diffuse 20% pCFX)  . ROBOT ASSITED LAPAROSCOPIC NEPHROURETERECTOMY Right 03/22/2020   Procedure: XI ROBOT ASSITED LAPAROSCOPIC NEPHROURETERECTOMY;  Surgeon: [name], MD;  [facility];  Service: Urology;  Laterality: Right;  . TRANSTHORACIC ECHOCARDIOGRAM  11-22-2016   dr hochrein   ef 55-60%/ mild MR and TR/ moderate LAE   Family History  Problem Relation Age of Onset  . Hypertension Mother    Social History   Socioeconomic History  . Marital status: Married    Spouse name: Not on file  . Number of children: Not on file  . Years of education: Not on file  . Highest education level: Not on file  Occupational History  . Not on file  Tobacco Use  . Smoking status: Never Smoker  . Smokeless tobacco: Former Systems developer    Types: Secondary school teacher  . Vaping Use: Never used  Substance and Sexual Activity  . Alcohol use: No  . Drug use: No  . Sexual  activity: Not on file  Other Topics Concern  . Not on file  Social History Narrative  . Not on file   Social Determinants of Health   Financial Resource Strain:   . Difficulty of Paying Living Expenses:   Food Insecurity:   . Worried About Charity fundraiser in the Last Year:   . Arboriculturist in the Last Year:   Transportation Needs:   . Film/video editor (Medical):   Marland Kitchen Lack of Transportation (Non-Medical):   Physical Activity:   . Days of Exercise per Week:   . Minutes of Exercise per Session:   Stress:   . Feeling of Stress :   Social Connections:   . Frequency of Communication with Friends and Family:   . Frequency of Social Gatherings with Friends and Family:   . Attends Religious Services:   . Active Member of Clubs or Organizations:   .  Attends Archivist Meetings:   Marland Kitchen Marital Status:     Outpatient Encounter Medications as of 05/26/2020  Medication Sig  . diphenhydramine-acetaminophen (TYLENOL PM) 25-500 MG TABS tablet Take 1 tablet by mouth at bedtime as needed (sleep.).   Marland Kitchen furosemide (LASIX) 20 MG tablet TAKE ONE TABLET BY MOUTH DAILY . MAY TAKE EXTRA TABLET IF NEEDED FOR SWELLING (Patient taking differently: Take 20 mg by mouth daily as needed for fluid. )  . isosorbide mononitrate (IMDUR) 30 MG 24 hr tablet TAKE ONE TABLET BY MOUTH DAILY  . metoprolol tartrate (LOPRESSOR) 25 MG tablet TAKE ONE TABLET BY MOUTH TWICE A DAY  . nitroGLYCERIN (NITROSTAT) 0.4 MG SL tablet PLACE ONE TABLET UNDER THE TONGUE EVERY 5 MINUTES TIMES THREE DOSES AS NEEDED FOR CHEST PAIN. CALL 911 IF 2ND DOSE DOES NOT HELP (Patient taking differently: Place 0.4 mg under the tongue every 5 (five) minutes x 3 doses as needed for chest pain. CALL 911 IF 2ND DOSE DOES NOT HELP)  . Olmesartan-amLODIPine-HCTZ 40-10-12.5 MG TABS TAKE HALF TABLET BY MOUTH DAILY (Patient taking differently: Take 0.5 tablets by mouth daily. )  . phenazopyridine (PYRIDIUM) 200 MG tablet Take 1 tablet (200  mg total) by mouth 3 (three) times daily as needed for pain. (Patient not taking: Reported on 03/09/2020)  . rosuvastatin (CRESTOR) 40 MG tablet Take 1 tablet (40 mg total) by mouth every morning. (Patient taking differently: Take 40 mg by mouth daily. )  . sitaGLIPtin (JANUVIA) 100 MG tablet Take 1 tablet (100 mg total) by mouth daily.  Marland Kitchen sulfamethoxazole-trimethoprim (BACTRIM DS) 800-160 MG tablet Take 1 tablet by mouth 2 (two) times daily. Begin on Friday morning (03/26/20)  . traMADol (ULTRAM) 50 MG tablet Take 1-2 tablets (50-100 mg total) by mouth every 6 (six) hours as needed (pain).   Facility-Administered Encounter Medications as of 05/26/2020  Medication  . sodium phosphate (FLEET) 7-19 GM/118ML enema 1 enema    Activities of Daily Living In your present state of health, do you have any difficulty performing the following activities: 03/22/2020 03/22/2020  Hearing? - N  Vision? - N  Difficulty concentrating or making decisions? - N  Walking or climbing stairs? - N  Dressing or bathing? - N  Doing errands, shopping? N -  Some recent data might be hidden    Patient Care Team: Ronnald Nian, DO as PCP - General (Family Medicine) Minus Breeding, MD as PCP - Cardiology (Cardiology)   Assessment:   This is a routine wellness examination for Adam Harris.  Exercise Activities and Dietary recommendations    Goals Addressed   None     Fall Risk Fall Risk  08/23/2018 05/22/2016  Falls in the past year? Yes No  Number falls in past yr: 1 -   FALL RISK PREVENTION PERTAINING TO THE HOME:  Any stairs in or around the home? {YES/NO:21197} If so, are there any without handrails? {YES/NO:21197}  Home free of loose throw rugs in walkways, pet beds, electrical cords, etc? {YES/NO:21197} Adequate lighting in your home to reduce risk of falls? {YES/NO:21197}  ASSISTIVE DEVICES UTILIZED TO PREVENT FALLS:  Life alert? {YES/NO:21197} Use of a cane, walker or w/c? {YES/NO:21197} Grab  bars in the bathroom? {YES/NO:21197} Shower chair or bench in shower? {YES/NO:21197} Elevated toilet seat or a handicapped toilet? {YES/NO:21197}   TIMED UP AND GO:  Was the test performed? {YES/NO:21197}.  Length of time to ambulate 10 feet: *** sec.   GAIT:  Appearance of gait: Gait steady  and fast *** with/without the use of an assistive device. OR Gait slow and steady *** with/without the use of an assistive device.  Education: Fall risk prevention has been discussed.  Intervention(s) required? {YES/NO:21197}  DME/home health order needed?  {YES/NO:21197}   Depression Screen PHQ 2/9 Scores 11/27/2019 08/23/2018 05/22/2016  PHQ - 2 Score 0 0 0    Cognitive Function        Immunization History  Administered Date(s) Administered  . DTaP 12/24/2014  . Fluad Quad(high Dose 65+) 09/04/2019  . Influenza, High Dose Seasonal PF 08/23/2018  . Influenza,inj,Quad PF,6+ Mos 07/27/2014  . Influenza-Unspecified 07/27/2014  . Pneumococcal Conjugate-13 05/22/2016  . Pneumococcal Polysaccharide-23 02/03/2014, 12/24/2014  . Tdap 02/03/2014, 12/24/2014, 07/07/2018    Qualifies for Shingles Vaccine? {YES/NO:21197} Zostavax completed ***. Due for Shingrix. Education has been provided regarding the importance of this vaccine. Pt has been advised to call insurance company to determine out of pocket expense. Advised may also receive vaccine at local pharmacy or Health Dept. Verbalized acceptance and understanding.  Tdap:  Up to Date  Flu Vaccine: Due 07/2020  Pneumococcal Vaccine:  Up to Dtae  Covid-19 Vaccine: *** Information provided/ Completed vaccines  Screening Tests Health Maintenance  Topic Date Due  . OPHTHALMOLOGY EXAM  Never done  . COVID-19 Vaccine (1) Never done  . COLONOSCOPY  Never done  . FOOT EXAM  05/22/2017  . INFLUENZA VACCINE  06/13/2020  . HEMOGLOBIN A1C  09/10/2020  . TETANUS/TDAP  07/07/2028  . Hepatitis C Screening  Completed  . PNA vac Low Risk Adult   Completed   Cancer Screenings:  Colorectal Screening: Completed ***. Repeat every *** years; No longer required. Referral to GI placed ***. Pt aware the office will call re: appt.  Lung Cancer Screening: (Low Dose CT Chest recommended if Age 62-80 years, 30 pack-year currently smoking OR have quit w/in 15years.) does not qualify.    Additional Screening:  Hepatitis C Screening: Completed 05/22/2016  Dental Screening: Recommended annual dental exams for proper oral hygiene  Community Resource Referral:  CRR required this visit?  {YES/NO:21197}       Plan:  I have personally reviewed and addressed the Medicare Annual Wellness questionnaire and have noted the following in the patient's chart:  A. Medical and social history B. Use of alcohol, tobacco or illicit drugs  C. Current medications and supplements D. Functional ability and status E.  Nutritional status F.  Physical activity G. Advance directives H. List of other physicians I.  Hospitalizations, surgeries, and ER visits in previous 12 months J.  [place] such as hearing and vision if needed, cognitive and depression L. Referrals and appointments   In addition, I have reviewed and discussed with patient certain preventive protocols, quality metrics, and best practice recommendations. A written personalized care plan for preventive services as well as general preventive health recommendations were provided to patient.   Signed,   Marta Antu, LPN  1/66/0630 Nurse Health Advisor  Nurse Notes:

## 2020-05-26 ENCOUNTER — Encounter: Payer: Self-pay | Admitting: Family Medicine

## 2020-05-26 ENCOUNTER — Ambulatory Visit (INDEPENDENT_AMBULATORY_CARE_PROVIDER_SITE_OTHER): Payer: Medicare Other | Admitting: Family Medicine

## 2020-05-26 ENCOUNTER — Ambulatory Visit: Payer: Medicare Other

## 2020-05-26 VITALS — BP 110/82 | HR 71 | Temp 97.6°F | Ht 71.0 in | Wt 207.8 lb

## 2020-05-26 DIAGNOSIS — E785 Hyperlipidemia, unspecified: Secondary | ICD-10-CM

## 2020-05-26 DIAGNOSIS — I25118 Atherosclerotic heart disease of native coronary artery with other forms of angina pectoris: Secondary | ICD-10-CM

## 2020-05-26 DIAGNOSIS — E1121 Type 2 diabetes mellitus with diabetic nephropathy: Secondary | ICD-10-CM | POA: Diagnosis not present

## 2020-05-26 DIAGNOSIS — I5032 Chronic diastolic (congestive) heart failure: Secondary | ICD-10-CM | POA: Diagnosis not present

## 2020-05-26 DIAGNOSIS — N1831 Chronic kidney disease, stage 3a: Secondary | ICD-10-CM

## 2020-05-26 DIAGNOSIS — E1122 Type 2 diabetes mellitus with diabetic chronic kidney disease: Secondary | ICD-10-CM

## 2020-05-26 DIAGNOSIS — D090 Carcinoma in situ of bladder: Secondary | ICD-10-CM

## 2020-05-26 DIAGNOSIS — I10 Essential (primary) hypertension: Secondary | ICD-10-CM

## 2020-05-26 NOTE — Progress Notes (Signed)
Adam Harris is a 74 y.o. male  No chief complaint on file.   HPI: Adam Harris is a 74 y.o. male here for routine f/u on chronic medical issues including HTN, hyperlipidemia, CAD, DM with CKD. In 03/2020, pt had Rt nephroureterectomy with Dr. [name] [facility]). He does not need any medication refills today. He follows with urology (Dr. [name], Dr. Karsten Ro), cardio (Dr. Percival Spanish). He is UTD on labs. No acute issues or concerns today.  Past Medical History:  Diagnosis Date  . Abnormal radiologic findings on diagnostic imaging of renal pelvis, ureter, or bladder    bilateral ureter abnormalities  . Anticoagulant long-term use    eliquis  . Anxiety   . Arthritis   . CAD (coronary artery disease) cardiologist-- dr hochrein   NSTEMI 02-04-2014  per cardiac cath chronic occluded RCA w/ faint left-to-right collaterals and aneurysmal LCFx with sluggish coronary flow/   NSTEMI --11-21-2016 per cardiac cath occluded proximal RCA & mid to diastal CFX 100%, med rx. If that does not work, PTCA or CABG  . Chronic diastolic heart failure ([place])   . CKD (chronic kidney disease), stage III    patient unaware  . DOE (dyspnea on exertion)    2-3 flights of stairs  . Dysrhythmia 2019   A-Fib  . Fatty liver    pt denies  . Hematuria   . Hematuria 02/2019  . History of COVID-19 10/2019  . History of non-ST elevation myocardial infarction (NSTEMI)    02-04-2014  and 11-21-2016  cardiac cath done both times ,  medically management  . History of shingles 12/2017   slight pain and numbness still noted in the area  . Hyperlipidemia   . Hypertension   . Insomnia   . Myocardial infarction ([place]) 2015  . Persistent atrial fibrillation [facility])    cardiologsit-- dr Percival Spanish  . Sciatica   . Thoracic aortic atherosclerosis ([place])   . Type 2 diabetes mellitus ([place])   . Urinary frequency     Past Surgical History:  Procedure Laterality Date  . CARDIAC CATHETERIZATION N/A 11/21/2016   Procedure: Left  Heart Cath and Coronary Angiography;  Surgeon: Troy Sine, MD;  Location: [place] [facility];  Service: Cardiovascular;  Laterality: N/A;  pRCA 100% , ostial LAD 45%, OM3 80%, mCFX to dCFX 100% (AV groove), lateral OM3 50%  . COLONOSCOPY    . CYSTOSCOPY WITH URETEROSCOPY AND STENT PLACEMENT Right 12/15/2019   Procedure: CYSTOSCOPY WITH RIGHT RETROGRADE/ RIGHT URETEROSCOPY/ BIOPSY;  Surgeon: [name], MD;  Location: [place];  Service: Urology;  Laterality: Right;  . CYSTOSCOPY/RETROGRADE/URETEROSCOPY Bilateral 03/18/2018   Procedure: CYSTOSCOPY/RETROGRADE/URETEROSCOPY.;  Surgeon: [name], MD;  Location: [facility];  Service: Urology;  Laterality: Bilateral;  . KNEE ARTHROSCOPY Right   . LEFT HEART CATHETERIZATION WITH CORONARY ANGIOGRAM N/A 02/04/2014   Procedure: LEFT HEART CATHETERIZATION WITH CORONARY ANGIOGRAM;  Surgeon: [name], MD;  Location: [place] [facility];  Service: Cardiovascular;  Laterality: N/A;  severe one-vessel CAD, chronically occluded RCA with faint left-to-right collaterals;  aneurysmal LCFx with sluggish coronary flow;  normal LVSF w/ moderately elevated LVEDP (ostialOM2 20%, pOM3 20%, pD1 20%, mCFX 50%, diffuse 20% pCFX)  . ROBOT ASSITED LAPAROSCOPIC NEPHROURETERECTOMY Right 03/22/2020   Procedure: XI ROBOT ASSITED LAPAROSCOPIC NEPHROURETERECTOMY;  Surgeon: [name], MD;  [facility];  Service: Urology;  Laterality: Right;  . TRANSTHORACIC ECHOCARDIOGRAM  11-22-2016   dr hochrein   ef 55-60%/ mild MR and TR/ moderate  LAE    Social History   Socioeconomic History  . Marital status: Married    Spouse name: Not on file  . Number of children: Not on file  . Years of education: Not on file  . Highest education level: Not on file  Occupational History  . Not on file  Tobacco Use  . Smoking status: Never Smoker  . Smokeless tobacco: Former Systems developer    Types: Secondary school teacher  . Vaping Use: Never used  Substance and  Sexual Activity  . Alcohol use: No  . Drug use: No  . Sexual activity: Not on file  Other Topics Concern  . Not on file  Social History Narrative  . Not on file   Social Determinants of Health   Financial Resource Strain:   . Difficulty of Paying Living Expenses:   Food Insecurity:   . Worried About Charity fundraiser in the Last Year:   . Arboriculturist in the Last Year:   Transportation Needs:   . Film/video editor (Medical):   Marland Kitchen Lack of Transportation (Non-Medical):   Physical Activity:   . Days of Exercise per Week:   . Minutes of Exercise per Session:   Stress:   . Feeling of Stress :   Social Connections:   . Frequency of Communication with Friends and Family:   . Frequency of Social Gatherings with Friends and Family:   . Attends Religious Services:   . Active Member of Clubs or Organizations:   . Attends Archivist Meetings:   Marland Kitchen Marital Status:   Intimate Partner Violence:   . Fear of Current or Ex-Partner:   . Emotionally Abused:   Marland Kitchen Physically Abused:   . Sexually Abused:     Family History  Problem Relation Age of Onset  . Hypertension Mother      Immunization History  Administered Date(s) Administered  . DTaP 12/24/2014  . Fluad Quad(high Dose 65+) 09/04/2019  . Influenza, High Dose Seasonal PF 08/23/2018  . Influenza,inj,Quad PF,6+ Mos 07/27/2014  . Influenza-Unspecified 07/27/2014  . Pneumococcal Conjugate-13 05/22/2016  . Pneumococcal Polysaccharide-23 02/03/2014, 12/24/2014  . Tdap 02/03/2014, 12/24/2014, 07/07/2018    Outpatient Encounter Medications as of 05/26/2020  Medication Sig  . diphenhydramine-acetaminophen (TYLENOL PM) 25-500 MG TABS tablet Take 1 tablet by mouth at bedtime as needed (sleep.).   Marland Kitchen ELIQUIS 5 MG TABS tablet Take 5 mg by mouth 2 (two) times daily.  . furosemide (LASIX) 20 MG tablet TAKE ONE TABLET BY MOUTH DAILY . MAY TAKE EXTRA TABLET IF NEEDED FOR SWELLING (Patient taking differently: Take 20 mg by  mouth daily as needed for fluid. )  . isosorbide mononitrate (IMDUR) 30 MG 24 hr tablet TAKE ONE TABLET BY MOUTH DAILY  . metoprolol tartrate (LOPRESSOR) 25 MG tablet TAKE ONE TABLET BY MOUTH TWICE A DAY  . nitroGLYCERIN (NITROSTAT) 0.4 MG SL tablet PLACE ONE TABLET UNDER THE TONGUE EVERY 5 MINUTES TIMES THREE DOSES AS NEEDED FOR CHEST PAIN. CALL 911 IF 2ND DOSE DOES NOT HELP (Patient taking differently: Place 0.4 mg under the tongue every 5 (five) minutes x 3 doses as needed for chest pain. CALL 911 IF 2ND DOSE DOES NOT HELP)  . Olmesartan-amLODIPine-HCTZ 40-10-12.5 MG TABS TAKE HALF TABLET BY MOUTH DAILY (Patient taking differently: Take 0.5 tablets by mouth daily. )  . rosuvastatin (CRESTOR) 40 MG tablet Take 1 tablet (40 mg total) by mouth every morning. (Patient taking differently: Take 40 mg by mouth  daily. )  . sitaGLIPtin (JANUVIA) 100 MG tablet Take 1 tablet (100 mg total) by mouth daily.  . phenazopyridine (PYRIDIUM) 200 MG tablet Take 1 tablet (200 mg total) by mouth 3 (three) times daily as needed for pain. (Patient not taking: Reported on 03/09/2020)  . sulfamethoxazole-trimethoprim (BACTRIM DS) 800-160 MG tablet Take 1 tablet by mouth 2 (two) times daily. Begin on Friday morning (03/26/20) (Patient not taking: Reported on 05/26/2020)  . traMADol (ULTRAM) 50 MG tablet Take 1-2 tablets (50-100 mg total) by mouth every 6 (six) hours as needed (pain). (Patient not taking: Reported on 05/26/2020)   Facility-Administered Encounter Medications as of 05/26/2020  Medication  . sodium phosphate (FLEET) 7-19 GM/118ML enema 1 enema     ROS: Pertinent positives and negatives noted in HPI. Remainder of ROS non-contributory    Allergies  Allergen Reactions  . Xarelto [Rivaroxaban] Other (See Comments)    Skin Blisters     BP 110/82 (BP Location: Left Arm, Patient Position: Sitting, Cuff Size: Normal)   Pulse 71   Temp 97.6 F (36.4 C) (Temporal)   Ht 5\' 11"  (1.803 m)   Wt 207 lb 12.8 oz  (94.3 kg)   SpO2 98%   BMI 28.98 kg/m   BP Readings from Last 3 Encounters:  05/26/20 110/82  03/25/20 127/83  03/24/20 113/75   Pulse Readings from Last 3 Encounters:  05/26/20 71  03/25/20 98  03/24/20 68   Wt Readings from Last 3 Encounters:  05/26/20 207 lb 12.8 oz (94.3 kg)  03/25/20 218 lb (98.9 kg)  03/22/20 218 lb 7.6 oz (99.1 kg)     Physical Exam Vitals reviewed.  Constitutional:      General: He is not in acute distress.    Appearance: Normal appearance.  Cardiovascular:     Rate and Rhythm: Normal rate and regular rhythm.     Pulses: Normal pulses.  Pulmonary:     Effort: Pulmonary effort is normal. No respiratory distress.     Breath sounds: Normal breath sounds. No wheezing, rhonchi or rales.  Abdominal:     General: Bowel sounds are normal.  Skin:    General: Skin is warm and dry.  Neurological:     General: No focal deficit present.     Mental Status: He is alert and oriented to person, place, and time.  Psychiatric:        Mood and Affect: Mood normal.        Behavior: Behavior normal.      A/P:  1. Essential hypertension - controlled, at goal - cont current meds  2. Type 2 diabetes mellitus with stage 3a chronic kidney disease, without long-term current use of insulin (HCC) - last A1C = 6.9 - cont current meds  3. Chronic diastolic CHF (congestive heart failure), NYHA class 2 (HCC) - stable, cont current meds - cont regular f/u with cardio  4. Dyslipidemia - cont crestor - due for FLP in 11/2020  5. Atherosclerotic heart disease of native coronary artery with other forms of angina pectoris ([place]) - stable, cont current meds - cont regular f/u with cardio  6. Carcinoma in situ of bladder - follows regularly with urology, due in 06/2020 for f/u   This visit occurred during the SARS-CoV-2 public health emergency.  Safety protocols were in place, including screening questions prior to the visit, additional usage of staff PPE, and  extensive cleaning of exam room while observing appropriate contact time as indicated for disinfecting solutions.

## 2020-05-27 ENCOUNTER — Ambulatory Visit: Payer: Medicare Other | Admitting: Family Medicine

## 2020-05-31 ENCOUNTER — Telehealth: Payer: Self-pay | Admitting: Family Medicine

## 2020-05-31 NOTE — Progress Notes (Signed)
  Chronic Care Management   Note  05/31/2020 Name: Maki Sweetser MRN: [id number] DOB: 04-09-1946  Suheyb Raucci is a 74 y.o. year old male who is a primary care patient of Cirigliano, Garvin Fila, DO. I reached out to Forrest Moron by phone today in response to a referral sent by Mr. Tyress Syracuse's PCP, Ronnald Nian, DO.   Mr. Fillinger was given information about Chronic Care Management services today including:  1. CCM service includes personalized support from designated clinical staff supervised by his physician, including individualized plan of care and coordination with other care providers 2. 24/7 contact phone numbers for assistance for urgent and routine care needs. 3. Service will only be billed when office clinical staff spend 20 minutes or more in a month to coordinate care. 4. Only one practitioner may furnish and bill the service in a calendar month. 5. The patient may stop CCM services at any time (effective at the end of the month) by phone call to the office staff.   Patient agreed to services and verbal consent obtained.   Follow up plan:   [facility]

## 2020-05-31 NOTE — Addendum Note (Signed)
Addended by: Darral Dash on: 05/31/2020 10:46 AM   Modules accepted: Orders

## 2020-06-09 ENCOUNTER — Telehealth: Payer: Self-pay

## 2020-06-09 NOTE — Progress Notes (Signed)
Have you seen any other providers since your last visit?no  Any changes in your medications or health? YES  Patient not taking phenazopyridine 200 MG,sodium phosphate 7-19 GM/118ML,Sulfamethoxazole-trimethoprim (Bactrim DS)800-160mg   Any side effects from any medications? No  Do you have an symptoms or problems not managed by your medications? No  Any concerns about your health right now? Yes  Patient is concerned about his heart , stated that the cardiologist told him that he had a blockage in his heart , he also little worried about his kidneys due to he had kidney cancer in the past.  Has your provider asked that you check blood pressure, blood sugar, or follow special diet at home? Yes  Patient stated  Urologist Borden MD recommended that he stay away from sugar, salt and carbohydrates , patient is trying his best to follow the recommendations.   Do you get any type of exercise on a regular basis? Yes  Patient walks alittle bit and always up and going and moving around.  Can you think of a goal you would like to reach for your health? To have his heart better  Do you have any problems getting your medications? Yes  Is there anything that you would like to discuss during the appointment?    Patient want assistance with medication expenses as he stated that him and his wife can barely afford the medications.   Patients informed me that his urologist does not want him to change any of his medication or th way he takes them  until he see his urologist [facility] again.  Please bring medications and supplements to appointment

## 2020-06-10 ENCOUNTER — Ambulatory Visit: Payer: Medicare Other

## 2020-06-11 ENCOUNTER — Ambulatory Visit: Payer: Medicare Other

## 2020-06-11 ENCOUNTER — Other Ambulatory Visit: Payer: Self-pay

## 2020-06-11 DIAGNOSIS — I10 Essential (primary) hypertension: Secondary | ICD-10-CM

## 2020-06-11 DIAGNOSIS — E1122 Type 2 diabetes mellitus with diabetic chronic kidney disease: Secondary | ICD-10-CM

## 2020-06-11 DIAGNOSIS — N1832 Chronic kidney disease, stage 3b: Secondary | ICD-10-CM

## 2020-06-11 NOTE — Chronic Care Management (AMB) (Signed)
Chronic Care Management Pharmacy  Name: Adam Harris  MRN: [id number] DOB: [birth year]   Chief Complaint/ HPI  Adam Harris,  74 y.o. , male presents for their Initial CCM visit with the clinical pharmacist In office.  PCP : Ronnald Nian, DO Patient Care Team: Ronnald Nian, DO as PCP - General (Family Medicine) Minus Breeding, MD as PCP - Cardiology (Cardiology) Germaine Pomfret, [facility] as Pharmacist (Pharmacist)  Their chronic conditions include: Hypertension, Hyperlipidemia, Diabetes, Atrial Fibrillation, Heart Failure, Coronary Artery Disease, Chronic Kidney Disease, Anxiety and Insomnia   Office Visits: 05/26/20: Patient presented to Dr. Bryan Lemma for follow-up. No medication changes made.   Consult Visit: 03/25/20: Patient presented to ED for acute knee pain. Patient prescribed prednisone.  03/22/20: Patient underwent Nephroureterectomy.   Allergies  Allergen Reactions   Xarelto [Rivaroxaban] Other (See Comments)    Skin Blisters     Medications: Outpatient Encounter Medications as of 06/11/2020  Medication Sig   diphenhydramine-acetaminophen (TYLENOL PM) 25-500 MG TABS tablet Take 1 tablet by mouth at bedtime as needed (sleep.).    ELIQUIS 5 MG TABS tablet Take 5 mg by mouth 2 (two) times daily.   furosemide (LASIX) 20 MG tablet TAKE ONE TABLET BY MOUTH DAILY . MAY TAKE EXTRA TABLET IF NEEDED FOR SWELLING (Patient taking differently: Take 20 mg by mouth daily as needed for fluid. )   isosorbide mononitrate (IMDUR) 30 MG 24 hr tablet TAKE ONE TABLET BY MOUTH DAILY   metoprolol tartrate (LOPRESSOR) 25 MG tablet TAKE ONE TABLET BY MOUTH TWICE A DAY   nitroGLYCERIN (NITROSTAT) 0.4 MG SL tablet PLACE ONE TABLET UNDER THE TONGUE EVERY 5 MINUTES TIMES THREE DOSES AS NEEDED FOR CHEST PAIN. CALL 911 IF 2ND DOSE DOES NOT HELP (Patient taking differently: Place 0.4 mg under the tongue every 5 (five) minutes x 3 doses as needed for chest pain. CALL 911 IF 2ND DOSE DOES NOT  HELP)   Olmesartan-amLODIPine-HCTZ 40-10-12.5 MG TABS TAKE HALF TABLET BY MOUTH DAILY (Patient taking differently: Take 0.5 tablets by mouth daily. )   phenazopyridine (PYRIDIUM) 200 MG tablet Take 1 tablet (200 mg total) by mouth 3 (three) times daily as needed for pain. (Patient not taking: Reported on 03/09/2020)   rosuvastatin (CRESTOR) 40 MG tablet Take 1 tablet (40 mg total) by mouth every morning. (Patient taking differently: Take 40 mg by mouth daily. )   sitaGLIPtin (JANUVIA) 100 MG tablet Take 1 tablet (100 mg total) by mouth daily.   sulfamethoxazole-trimethoprim (BACTRIM DS) 800-160 MG tablet Take 1 tablet by mouth 2 (two) times daily. Begin on Friday morning (03/26/20) (Patient not taking: Reported on 05/26/2020)   traMADol (ULTRAM) 50 MG tablet Take 1-2 tablets (50-100 mg total) by mouth every 6 (six) hours as needed (pain). (Patient not taking: Reported on 05/26/2020)   Facility-Administered Encounter Medications as of 06/11/2020  Medication   sodium phosphate (FLEET) 7-19 GM/118ML enema 1 enema     Current Diagnosis/Assessment:  SDOH Interventions     Most Recent Value  SDOH Interventions  Financial Strain Interventions Other (Comment)  [Referred to [employer] for Extra Help Program]  Transportation Interventions Intervention Not Indicated      Goals Addressed            This Visit's Progress    Chronic Care Management       CARE PLAN ENTRY (see longitudinal plan of care for additional care plan information)  Current Barriers:   Chronic Disease Management support, education, and care coordination  needs related to Hypertension, Hyperlipidemia, Diabetes, Atrial Fibrillation, Heart Failure, Coronary Artery Disease, Chronic Kidney Disease, Anxiety and Insomnia    Hypertension BP Readings from Last 3 Encounters:  05/26/20 110/82  03/25/20 127/83  03/24/20 113/75    Pharmacist Clinical Goal(s): o Over the next 90 days, patient will work with PharmD and  providers to maintain BP goal <130/80  Current regimen:   Furosemide 20 mg daily as needed  Isosorbide Mononitrate 30 mg daily   Metoprolol tartrate 25 mg twice daily   Olmesartan-Amlodipine-HCTZ 40-10-12.5 mg 1/2 tab daily  Interventions: o Discussed low salt diet and exercising as tolerated extensively  Patient self care activities - Over the next 90 days, patient will: o Check BP weekly, document, and provide at future appointments o Ensure daily salt intake < 2300 mg/day  Hyperlipidemia Lab Results  Component Value Date/Time   LDLCALC 88 06/25/2019 04:21 PM    Pharmacist Clinical Goal(s): o Over the next 90 days, patient will work with PharmD and providers to achieve LDL goal < 70  Current regimen:  o Rosuvastatin 40 mg daily   Interventions: o Discussed low cholesterol diet and exercising as tolerated extensively o Recommend stopping rosuvastatin and restarting atorvastatin 80 mg daily  Diabetes Lab Results  Component Value Date/Time   HGBA1C 6.9 (H) 03/11/2020 02:04 PM   HGBA1C 7.1 (H) 11/27/2019 03:56 PM    Pharmacist Clinical Goal(s): o Over the next 90 days, patient will work with PharmD and providers to maintain A1c goal <7%  Current regimen:  o Januvia 100 mg daily   Interventions: o Discussed carbohydrate counting and exercising as tolerated extensively o Recommend decreasing Januvia to 50 mg daily   Patient self care activities - Over the next 90 days, patient will: o Contact provider with any episodes of hypoglycemia  Medication management  Pharmacist Clinical Goal(s): o Over the next 90 days, patient will work with PharmD and providers to maintain optimal medication adherence  Current pharmacy: [facility]  Interventions o Comprehensive medication review performed. o Continue current medication management strategy o   Patient self care activities - Over the next 90 days, patient will: o Take medications as  prescribed o Report any questions or concerns to PharmD and/or provider(s) o Reach out to [employer] and apply for Extra Help Program to help with medication costs.       AFIB   Patient is currently rate controlled.  Patient has failed these meds in past: n/a Patient is currently controlled on the following medications:   Eliquis 5 mg BID   Metoprolol tartrate 25 mg BID   We discussed:  Denies unusual bruising/bleeding. Patient states affordability challenges with Elliquis.   Plan  Continue current medications  Patient to apply for Extra Help Program, otherwise will try for PAP.   Heart Failure   Type: Diastolic  Last ejection fraction: 55-60% NYHA Class: II (slight limitation of activity) AHA HF Stage: B (Heart disease present - no symptoms present)  Patient has failed these meds in past: n/a Patient is currently controlled on the following medications:   Furosemide 20 mg daily (takes PRN)  Isosorbide Mononitrate 30 mg daily   Metoprolol tartrate 25 mg BID   Olmesartan-Amlodipine-HCTZ 40-10-12.5 mg 1/2 tab daily   We discussed weighing daily; if you gain more than 3 pounds in one day or 5 pounds in one week call your doctor  Plan  Continue current medications  Diabetes   A1c goal <7%  Recent Relevant Labs: Lab  Results  Component Value Date/Time   HGBA1C 6.9 (H) 03/11/2020 02:04 PM   HGBA1C 7.1 (H) 11/27/2019 03:56 PM   GFR 42.58 (L) 11/27/2019 03:56 PM   GFR 48.50 (L) 05/22/2016 04:27 PM   MICROALBUR 33.1 (H) 11/28/2019 02:29 PM   MICROALBUR 1.0 05/22/2016 04:27 PM    CMP Latest Ref Rng & Units 03/24/2020 03/23/2020 03/23/2020  Glucose 70 - 99 mg/dL 129(H) 158(H) 187(H)  BUN 8 - 23 mg/dL 42(H) 39(H) 38(H)  Creatinine 0.61 - 1.24 mg/dL 2.51(H) 2.63(H) 2.43(H)  Sodium 135 - 145 mmol/L 134(L) 135 133(L)  Potassium 3.5 - 5.1 mmol/L 4.5 4.7 4.7  Chloride 98 - 111 mmol/L 104 103 102  CO2 22 - 32 mmol/L 24 26 24   Calcium 8.9 - 10.3 mg/dL 8.1(L)  8.5(L) 8.0(L)  Total Protein 6.5 - 8.1 g/dL - - -  Total Bilirubin 0.3 - 1.2 mg/dL - - -  Alkaline Phos 38 - 126 U/L - - -  AST 14 - 40 - - -  ALT 10 - 40 - - -     Last diabetic Eye exam: No results found for: HMDIABEYEEXA  Last diabetic Foot exam: No results found for: HMDIABFOOTEX   Checking BG: Never  Recent FBG Readings: n/a Recent pre-meal BG readings: n/a Recent 2hr PP BG readings:  n/a Recent HS BG readings: n/a  Patient has failed these meds in past: Metformin Patient is currently controlled on the following medications:  Januvia 100 mg daily   We discussed: diet and exercise extensively  Plan  Recommend decreasing Januvia to 50 mg daily (based on eGFR 30-45 mL/min)   Hypertension   BP goal is:  <130/80  Office blood pressures are  BP Readings from Last 3 Encounters:  05/26/20 110/82  03/25/20 127/83  03/24/20 113/75   Patient checks BP at home infrequently Patient home BP readings are ranging: n/a  Patient has failed these meds in the past: n/a Patient is currently controlled on the following medications:   Furosemide 20 mg daily (takes PRN)  Isosorbide Mononitrate 30 mg daily   Metoprolol tartrate 25 mg BID   Olmesartan-Amlodipine-HCTZ 40-10-12.5 mg 1/2 tab daily  We discussed diet and exercise extensively  Plan  Continue current medications   Hyperlipidemia   NSTEMI 2017   LDL goal < 70  Lipid Panel     Component Value Date/Time   CHOL 149 06/25/2019 1621   TRIG 91 06/25/2019 1621   HDL 43 06/25/2019 1621   LDLCALC 88 06/25/2019 1621    Hepatic Function Latest Ref Rng & Units 01/07/2020 08/23/2018 11/22/2016  Total Protein 6.5 - 8.1 g/dL - - 5.5(L)  Albumin 3.5 - 5.0 4.2 - 3.1(L)  AST 14 - 40 14 17 17   ALT 10 - 40 9(A) 12 20  Alk Phosphatase 38 - 126 U/L - - 57  Total Bilirubin 0.3 - 1.2 mg/dL - - 1.3(H)  Bilirubin, Direct 0.1 - 0.5 mg/dL - - 0.2     The ASCVD Risk score Mikey Bussing DC Jr., et al., 2013) failed to calculate for  the following reasons:   The patient has a prior MI or stroke diagnosis   Patient has failed these meds in past: Atorvastatin 80 mg daily (ineffective)  Patient is currently uncontrolled on the following medications:   Rosuvastatin 40 mg daily   We discussed:  diet and exercise extensively. Patient unsure why switched to rosuvastatin, switched on 06/29/19 as LDL not at goal.   Plan  Recommend stopping Rosuvastatin  40 mg daily due to chronic kidney disease  Recommend resuming atorvastatin 80 mg daily due to lack of impact on kidney function.  Misc / OTC     Phenazopyridine 200 mg TID PRN   Bactrim DS   Nitroglycerin 0.4 mg SL PRN   Tramadol 50 mg   Tylenol PM   Plan  Continue current medications   Medication Management   Pt uses [facility] for all medications Uses pill box? Yes  Plan  Continue current medication management strategy  Follow up: 2 week CMA phone visit for medication cost assistance.   Doristine Section Clinical Pharmacist [facility] at [facility]  [phone]

## 2020-06-11 NOTE — Patient Instructions (Addendum)
Visit Information It was great speaking with you today!  Please let me know if you have any questions about our visit.  Goals Addressed            This Visit's Progress   . Chronic Care Management       CARE PLAN ENTRY (see longitudinal plan of care for additional care plan information)  Current Barriers:  . Chronic Disease Management support, education, and care coordination needs related to Hypertension, Hyperlipidemia, Diabetes, Atrial Fibrillation, Heart Failure, Coronary Artery Disease, Chronic Kidney Disease, Anxiety and Insomnia    Hypertension BP Readings from Last 3 Encounters:  05/26/20 110/82  03/25/20 127/83  03/24/20 113/75   . Pharmacist Clinical Goal(s): o Over the next 90 days, patient will work with PharmD and providers to maintain BP goal <130/80 . Current regimen:  . Furosemide 20 mg daily as needed . Isosorbide Mononitrate 30 mg daily  . Metoprolol tartrate 25 mg twice daily  . Olmesartan-Amlodipine-HCTZ 40-10-12.5 mg 1/2 tab daily . Interventions: o Discussed low salt diet and exercising as tolerated extensively . Patient self care activities - Over the next 90 days, patient will: o Check BP weekly, document, and provide at future appointments o Ensure daily salt intake < 2300 mg/day  Hyperlipidemia Lab Results  Component Value Date/Time   LDLCALC 88 06/25/2019 04:21 PM   . Pharmacist Clinical Goal(s): o Over the next 90 days, patient will work with PharmD and providers to achieve LDL goal < 70 . Current regimen:  o Rosuvastatin 40 mg daily  . Interventions: o Discussed low cholesterol diet and exercising as tolerated extensively o Recommend stopping rosuvastatin and restarting atorvastatin 80 mg daily  Diabetes Lab Results  Component Value Date/Time   HGBA1C 6.9 (H) 03/11/2020 02:04 PM   HGBA1C 7.1 (H) 11/27/2019 03:56 PM   . Pharmacist Clinical Goal(s): o Over the next 90 days, patient will work with PharmD and providers to maintain A1c  goal <7% . Current regimen:  o Januvia 100 mg daily  . Interventions: o Discussed carbohydrate counting and exercising as tolerated extensively o Recommend decreasing Januvia to 50 mg daily  . Patient self care activities - Over the next 90 days, patient will: o Contact provider with any episodes of hypoglycemia  Medication management . Pharmacist Clinical Goal(s): o Over the next 90 days, patient will work with PharmD and providers to maintain optimal medication adherence . Current pharmacy: [facility] . Interventions o Comprehensive medication review performed. o Continue current medication management strategy o  . Patient self care activities - Over the next 90 days, patient will: o Take medications as prescribed o Report any questions or concerns to PharmD and/or provider(s) o Reach out to [employer] and apply for Extra Help Program to help with medication costs.       Adam Harris was given information about Chronic Care Management services today including:  1. CCM service includes personalized support from designated clinical staff supervised by his physician, including individualized plan of care and coordination with other care providers 2. 24/7 contact phone numbers for assistance for urgent and routine care needs. 3. Standard insurance, coinsurance, copays and deductibles apply for chronic care management only during months in which we provide at least 20 minutes of these services. Most insurances cover these services at 100%, however patients may be responsible for any copay, coinsurance and/or deductible if applicable. This service may help you avoid the need for more expensive face-to-face services. 4. Only one practitioner may furnish  and bill the service in a calendar month. 5. The patient may stop CCM services at any time (effective at the end of the month) by phone call to the office staff.  Patient agreed to services and verbal consent obtained.    Print copy of patient instructions provided.  The pharmacy team will reach out to the patient again over the next 14 days.   [place] at [facility]  [phone]

## 2020-06-15 LAB — PSA: PSA: 8.69

## 2020-06-16 ENCOUNTER — Ambulatory Visit: Payer: Self-pay

## 2020-06-16 ENCOUNTER — Telehealth: Payer: Self-pay

## 2020-06-16 DIAGNOSIS — E785 Hyperlipidemia, unspecified: Secondary | ICD-10-CM

## 2020-06-16 DIAGNOSIS — E1122 Type 2 diabetes mellitus with diabetic chronic kidney disease: Secondary | ICD-10-CM

## 2020-06-16 NOTE — Chronic Care Management (AMB) (Signed)
Patient called stating he was denied coverage for the Extra Help ([place] Program).   Will work on PAP applications for eligible medications for him and his wife, Adam Harris.   Patient also stated he was interested in enhanced pharmacy services with [facility]. Informed patient I will have to do a cost analysis to ensure his medication prices will not increase.   Verbal consent obtained for [facility] enhanced pharmacy services (medication synchronization, adherence packaging, delivery coordination). A medication sync plan was created to allow patient to get all medications delivered once every 30 to 90 days per patient preference. Patient understands they have freedom to choose pharmacy and clinical pharmacist will coordinate care between all prescribers and [facility].  Will begin onboarding process for patient and wife.

## 2020-06-16 NOTE — Chronic Care Management (AMB) (Signed)
Chronic Care Management Pharmacy Assistant   Name: Adam Harris  MRN: [id number] DOB: [birth year]  Reason for Encounter: Medication Review/ Patient assistance form  PCP : Ronnald Nian, DO  Allergies:   Allergies  Allergen Reactions  . Xarelto [Rivaroxaban] Other (See Comments)    Skin Blisters     Medications: Outpatient Encounter Medications as of 06/16/2020  Medication Sig  . diphenhydramine-acetaminophen (TYLENOL PM) 25-500 MG TABS tablet Take 1 tablet by mouth at bedtime as needed (sleep.).   Marland Kitchen ELIQUIS 5 MG TABS tablet Take 5 mg by mouth 2 (two) times daily.  . furosemide (LASIX) 20 MG tablet TAKE ONE TABLET BY MOUTH DAILY . MAY TAKE EXTRA TABLET IF NEEDED FOR SWELLING (Patient taking differently: Take 20 mg by mouth daily as needed for fluid. )  . isosorbide mononitrate (IMDUR) 30 MG 24 hr tablet TAKE ONE TABLET BY MOUTH DAILY  . metoprolol tartrate (LOPRESSOR) 25 MG tablet TAKE ONE TABLET BY MOUTH TWICE A DAY  . nitroGLYCERIN (NITROSTAT) 0.4 MG SL tablet PLACE ONE TABLET UNDER THE TONGUE EVERY 5 MINUTES TIMES THREE DOSES AS NEEDED FOR CHEST PAIN. CALL 911 IF 2ND DOSE DOES NOT HELP (Patient taking differently: Place 0.4 mg under the tongue every 5 (five) minutes x 3 doses as needed for chest pain. CALL 911 IF 2ND DOSE DOES NOT HELP)  . Olmesartan-amLODIPine-HCTZ 40-10-12.5 MG TABS TAKE HALF TABLET BY MOUTH DAILY (Patient taking differently: Take 0.5 tablets by mouth daily. )  . phenazopyridine (PYRIDIUM) 200 MG tablet Take 1 tablet (200 mg total) by mouth 3 (three) times daily as needed for pain. (Patient not taking: Reported on 03/09/2020)  . rosuvastatin (CRESTOR) 40 MG tablet Take 1 tablet (40 mg total) by mouth every morning. (Patient taking differently: Take 40 mg by mouth daily. )  . sitaGLIPtin (JANUVIA) 100 MG tablet Take 1 tablet (100 mg total) by mouth daily.  Marland Kitchen sulfamethoxazole-trimethoprim (BACTRIM DS) 800-160 MG tablet Take 1 tablet by mouth 2 (two) times daily. Begin  on Friday morning (03/26/20) (Patient not taking: Reported on 05/26/2020)  . traMADol (ULTRAM) 50 MG tablet Take 1-2 tablets (50-100 mg total) by mouth every 6 (six) hours as needed (pain). (Patient not taking: Reported on 05/26/2020)   Facility-Administered Encounter Medications as of 06/16/2020  Medication  . sodium phosphate (FLEET) 7-19 GM/118ML enema 1 enema    Current Diagnosis: Patient Active Problem List   Diagnosis Date Noted  . 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 Educated about COVID-19 virus infection 10/28/2019  . Shingles 01/10/2018  . Chronic diastolic CHF (congestive heart failure), NYHA class 2 ([place]) 04/02/2017  . Atrial fibrillation with RVR ([place]) 11/20/2016  . Degenerative arthritis of knee, bilateral 05/31/2016  . Low back pain 05/31/2016  . Routine general medical examination at a health care facility 12/25/2014  . Exertional angina ([place]) 10/29/2014  . Atrial fibrillation, new onset ([place]) 10/29/2014  . Chronic renal disease, stage III 10/29/2014  . History of coronary artery stent placement 07/24/2014  . Coronary artery disease 03/09/2014  . Diabetes mellitus, type 2 ([place]) 02/17/2014  . Dyslipidemia 02/05/2014  . NSTEMI (non-ST elevated myocardial infarction) ([place]) 02/04/2014  . History of non-ST elevation myocardial infarction (NSTEMI) 02/03/2014  . Essential hypertension 02/03/2014  . Chest pain 02/03/2014  . Abnormal prostate specific antigen 04/19/2013  . Anxiety state 04/19/2013  . Increased frequency of urination 04/19/2013  . 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 Insomnia 04/19/2013  . Stiffness of joint, lower leg 04/19/2013  .  Sciatica 04/19/2013  . Tension type headache 04/19/2013    Goals Addressed   None     Follow-Up:  Patient Assistance Coordination   Form has been completed and forward to CPP for review.  [place] Pharmacist Assistant [phone]

## 2020-06-18 NOTE — Progress Notes (Signed)
Chronic Care Management Pharmacy Assistant   Name: Adam Harris  MRN: [id number] DOB: [birth year]  Reason for Encounter: Medication Review  Patient Questions:  1.  Have you seen any other providers since your last visit? No  2.  Any changes in your medicines or health? No    PCP : Ronnald Nian, DO  Allergies:   Allergies  Allergen Reactions  . Xarelto [Rivaroxaban] Other (See Comments)    Skin Blisters     Medications: Outpatient Encounter Medications as of 06/16/2020  Medication Sig  . diphenhydramine-acetaminophen (TYLENOL PM) 25-500 MG TABS tablet Take 1 tablet by mouth at bedtime as needed (sleep.).   Marland Kitchen ELIQUIS 5 MG TABS tablet Take 5 mg by mouth 2 (two) times daily.  . furosemide (LASIX) 20 MG tablet TAKE ONE TABLET BY MOUTH DAILY . MAY TAKE EXTRA TABLET IF NEEDED FOR SWELLING (Patient taking differently: Take 20 mg by mouth daily as needed for fluid. )  . isosorbide mononitrate (IMDUR) 30 MG 24 hr tablet TAKE ONE TABLET BY MOUTH DAILY  . metoprolol tartrate (LOPRESSOR) 25 MG tablet TAKE ONE TABLET BY MOUTH TWICE A DAY  . nitroGLYCERIN (NITROSTAT) 0.4 MG SL tablet PLACE ONE TABLET UNDER THE TONGUE EVERY 5 MINUTES TIMES THREE DOSES AS NEEDED FOR CHEST PAIN. CALL 911 IF 2ND DOSE DOES NOT HELP (Patient taking differently: Place 0.4 mg under the tongue every 5 (five) minutes x 3 doses as needed for chest pain. CALL 911 IF 2ND DOSE DOES NOT HELP)  . Olmesartan-amLODIPine-HCTZ 40-10-12.5 MG TABS TAKE HALF TABLET BY MOUTH DAILY (Patient taking differently: Take 0.5 tablets by mouth daily. )  . phenazopyridine (PYRIDIUM) 200 MG tablet Take 1 tablet (200 mg total) by mouth 3 (three) times daily as needed for pain. (Patient not taking: Reported on 03/09/2020)  . rosuvastatin (CRESTOR) 40 MG tablet Take 1 tablet (40 mg total) by mouth every morning. (Patient taking differently: Take 40 mg by mouth daily. )  . sitaGLIPtin (JANUVIA) 100 MG tablet Take 1 tablet (100 mg total) by mouth  daily.  Marland Kitchen sulfamethoxazole-trimethoprim (BACTRIM DS) 800-160 MG tablet Take 1 tablet by mouth 2 (two) times daily. Begin on Friday morning (03/26/20) (Patient not taking: Reported on 05/26/2020)  . traMADol (ULTRAM) 50 MG tablet Take 1-2 tablets (50-100 mg total) by mouth every 6 (six) hours as needed (pain). (Patient not taking: Reported on 05/26/2020)   Facility-Administered Encounter Medications as of 06/16/2020  Medication  . sodium phosphate (FLEET) 7-19 GM/118ML enema 1 enema    Current Diagnosis: Patient Active Problem List   Diagnosis Date Noted  . 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 Educated about COVID-19 virus infection 10/28/2019  . Shingles 01/10/2018  . Chronic diastolic CHF (congestive heart failure), NYHA class 2 ([place]) 04/02/2017  . Atrial fibrillation with RVR ([place]) 11/20/2016  . Degenerative arthritis of knee, bilateral 05/31/2016  . Low back pain 05/31/2016  . Routine general medical examination at a health care facility 12/25/2014  . Exertional angina ([place]) 10/29/2014  . Atrial fibrillation, new onset ([place]) 10/29/2014  . Chronic renal disease, stage III 10/29/2014  . History of coronary artery stent placement 07/24/2014  . Coronary artery disease 03/09/2014  . Diabetes mellitus, type 2 ([place]) 02/17/2014  . Dyslipidemia 02/05/2014  . NSTEMI (non-ST elevated myocardial infarction) ([place]) 02/04/2014  . History of non-ST elevation myocardial infarction (NSTEMI) 02/03/2014  . Essential hypertension 02/03/2014  . Chest pain 02/03/2014  . Abnormal prostate specific antigen 04/19/2013  . Anxiety state 04/19/2013  .  Increased frequency of urination 04/19/2013  . 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 Insomnia 04/19/2013  . Stiffness of joint, lower leg 04/19/2013  . Sciatica 04/19/2013  . Tension type headache 04/19/2013    Goals Addressed   None     Follow-Up:  Patient Assistance Coordination and Pharmacist Review   Completed Onboarding  form.  Spoke to patient to inform him that we are sending him a Patient assistance form  for Eliquis by mail.Informed patient to include a copy of his proof of income AND a copy of his Explanation of Benefits (EOB) statement from her insurance.Advised patient to return the PAP forms back to the [place] office.  [place] Pharmacist Assistant [phone]

## 2020-06-21 ENCOUNTER — Other Ambulatory Visit: Payer: Self-pay | Admitting: Cardiology

## 2020-06-21 MED ORDER — ELIQUIS 5 MG PO TABS: 5.0000 mg | ORAL_TABLET | Freq: Two times a day (BID) | ORAL | 1 refills | Status: DC

## 2020-06-21 MED ORDER — ISOSORBIDE MONONITRATE ER 30 MG PO TB24: 30.0000 mg | ORAL_TABLET | Freq: Every day | ORAL | 1 refills | Status: DC

## 2020-06-21 MED ORDER — NITROGLYCERIN 0.4 MG SL SUBL: SUBLINGUAL_TABLET | SUBLINGUAL | 5 refills | Status: DC

## 2020-06-21 MED ORDER — OLMESARTAN-AMLODIPINE-HCTZ 40-10-12.5 MG PO TABS: 0.5000 | ORAL_TABLET | Freq: Every day | ORAL | 4 refills | Status: DC

## 2020-06-21 MED ORDER — FUROSEMIDE 20 MG PO TABS: 20.0000 mg | ORAL_TABLET | Freq: Every day | ORAL | 1 refills | Status: DC | PRN

## 2020-06-21 NOTE — Telephone Encounter (Signed)
New Message    *STAT* If patient is at the pharmacy, call can be transferred to refill team.   1. Which medications need to be refilled? (please list name of each medication and dose if known) ELIQUIS 5 MG TABS tablet, furosemide (LASIX) 20 MG tablet, isosorbide mononitrate (IMDUR) 30 MG 24 hr tablet, metoprolol tartrate (LOPRESSOR) 25 MG tablet, nitroGLYCERIN (NITROSTAT) 0.4 MG SL tablet, Olmesartan-amLODIPine-HCTZ 40-10-12.5 MG TABS, rosuvastatin (CRESTOR) 40 MG tablet  2. Which pharmacy/location (including street and city if local pharmacy) is medication to be sent to? [facility] - [place], [place] - [place] [address]  3. Do they need a 30 day or 90 day supply? 90 day

## 2020-06-22 ENCOUNTER — Other Ambulatory Visit: Payer: Self-pay

## 2020-06-22 DIAGNOSIS — E1122 Type 2 diabetes mellitus with diabetic chronic kidney disease: Secondary | ICD-10-CM

## 2020-06-22 MED ORDER — SITAGLIPTIN PHOSPHATE 100 MG PO TABS: 100.0000 mg | ORAL_TABLET | Freq: Every day | ORAL | 1 refills | Status: DC

## 2020-06-23 ENCOUNTER — Telehealth: Payer: Self-pay | Admitting: Cardiology

## 2020-06-23 DIAGNOSIS — C651 Malignant neoplasm of right renal pelvis: Secondary | ICD-10-CM | POA: Diagnosis not present

## 2020-06-23 DIAGNOSIS — C61 Malignant neoplasm of prostate: Secondary | ICD-10-CM | POA: Diagnosis not present

## 2020-06-23 DIAGNOSIS — D090 Carcinoma in situ of bladder: Secondary | ICD-10-CM | POA: Diagnosis not present

## 2020-06-23 DIAGNOSIS — R8271 Bacteriuria: Secondary | ICD-10-CM | POA: Diagnosis not present

## 2020-06-23 NOTE — Telephone Encounter (Signed)
Left a message at [facility] pharmacy. Spoke with pt, aware samples at the front desk for pick up. Also gave the patient the number to the assistance program to get help through the donut hole.

## 2020-06-23 NOTE — Telephone Encounter (Signed)
Patient calling the office for samples of medication:   1.  What medication and dosage are you requesting samples for? ELIQUIS 5 MG TABS tablet 2.  Are you currently out of this medication? No, but is in the doughnut hole.

## 2020-07-02 ENCOUNTER — Other Ambulatory Visit (HOSPITAL_COMMUNITY): Payer: Self-pay | Admitting: Urology

## 2020-07-02 ENCOUNTER — Telehealth: Payer: Self-pay | Admitting: Cardiology

## 2020-07-02 ENCOUNTER — Other Ambulatory Visit: Payer: Self-pay | Admitting: Urology

## 2020-07-02 DIAGNOSIS — C61 Malignant neoplasm of prostate: Secondary | ICD-10-CM

## 2020-07-02 NOTE — Telephone Encounter (Signed)
      [facility] Pre-operative Risk Assessment    HEARTCARE STAFF: - Please ensure there is not already an duplicate clearance open for this procedure. - Under Visit Info/Reason for Call, type in Other and utilize the format Clearance MM/DD/YY or Clearance TBD. Do not use dashes or single digits. - If request is for dental extraction, please clarify the # of teeth to be extracted.  Request for surgical clearance:  1. What type of surgery is being performed? cystoscopy bladder biopsy transurethral resection of bladder tumor, transrectal ultrasound guided prostate biopsy  2. When is this surgery scheduled? TBD   3. What type of clearance is required (medical clearance vs. Pharmacy clearance to hold med vs. Both)? Both  4. Are there any medications that need to be held prior to surgery and how long? Eliquis need to be held 3 days prior   5. Practice name and name of physician performing surgery? Dr. [name]  6. What is the office phone number? [place]   7.   What is the office fax number? [phone]  8.   Anesthesia type (None, local, MAC, general) ? General   Adam Harris 07/02/2020, 3:39 PM  _________________________________________________________________   (provider comments below)

## 2020-07-05 ENCOUNTER — Encounter: Payer: Self-pay | Admitting: Family Medicine

## 2020-07-05 ENCOUNTER — Other Ambulatory Visit: Payer: Self-pay | Admitting: Cardiology

## 2020-07-05 NOTE — Telephone Encounter (Signed)
Patient with diagnosis of afib on Eliquis for anticoagulation.    Procedure: cystoscopy bladder biopsy, TURP Date of procedure: TBD  CHADS2-VASc score of 5 (age, CHF, HTN, DM, CAD)  CrCl 39mL/min Platelet count 158K  Per office protocol, patient can hold Eliquis for 3 days prior to procedure as requested.

## 2020-07-05 NOTE — Telephone Encounter (Signed)
Clinical pharmacist to review Eliquis 

## 2020-07-05 NOTE — Telephone Encounter (Signed)
   Primary Cardiologist: Minus Breeding, MD  Chart reviewed and patient contacted by phone today as part of pre-operative protocol coverage. Given past medical history and time since last visit, based on ACC/AHA guidelines, Josia Cueva would be at acceptable risk for the planned procedure without further cardiovascular testing.   OK to hold Eliquis 3 days pre op if needed-resume as soon as possible post op.   I will route this recommendation to the requesting party via Epic fax function and remove from pre-op pool.  Please call with questions.  Kerin Ransom, PA-C 07/05/2020, 10:29 AM

## 2020-07-09 ENCOUNTER — Other Ambulatory Visit: Payer: Self-pay | Admitting: Physician Assistant

## 2020-07-23 NOTE — Patient Instructions (Signed)
DUE TO COVID-19 ONLY ONE VISITOR IS ALLOWED TO COME WITH YOU AND STAY IN THE WAITING ROOM ONLY DURING PRE OP AND PROCEDURE.   IF YOU WILL BE ADMITTED INTO THE HOSPITAL YOU ARE ALLOWED ONE SUPPORT PERSON DURING VISITATION HOURS ONLY (10AM -8PM)   . The support person may change daily. . The support person must pass our screening, gel in and out, and wear a mask at all times, including in the patient's room. . Patients must also wear a mask when staff or their support person are in the room.   COVID SWAB TESTING MUST BE COMPLETED ON:  Monday, 08-09-20 @    [address] [place], [place] [address]  (Must self quarantine after testing. Follow instructions on handout.)        Your procedure is scheduled on: Thursday, 08-12-20   Report to [facility] Main  Entrance   Report to Short Stay at 5:30 AM   [facility])    Call this number if you have problems the morning of surgery [phone]   Follow prep per surgeon's office:  USE A FLEET ENEMA 2-3 HOURS PRIOR TO ARRIVAL    Do not eat food :After Midnight.   May have liquids until 4:30 AM  day of surgery   CLEAR LIQUID DIET  Foods Allowed                                                                     Foods Excluded  Water, Black Coffee and tea, regular and decaf            liquids that you cannot  Plain Jell-O in any flavor  (No red)                                   see through such as: Fruit ices (not with fruit pulp)                                      milk, soups, orange juice              Iced Popsicles (No red)                                      All solid food                                   Apple juices Sports drinks like Gatorade (No red) Lightly seasoned clear broth or consume(fat free) Sugar, honey syrup    Oral Hygiene is also important to reduce your risk of infection.                                    Remember - BRUSH YOUR TEETH THE MORNING OF SURGERY WITH YOUR REGULAR TOOTHPASTE   Do NOT smoke  after Midnight   Take these medicines the  morning of surgery with A SIP OF WATER: Metoprolol, Isosorbide                  How to Manage Your Diabetes Before and After Surgery  Why is it important to control my blood sugar before and after surgery? . Improving blood sugar levels before and after surgery helps healing and can limit problems. . A way of improving blood sugar control is eating a healthy diet by: o  Eating less sugar and carbohydrates o  Increasing activity/exercise o  Talking with your doctor about reaching your blood sugar goals . High blood sugars (greater than 180 mg/dL) can raise your risk of infections and slow your recovery, so you will need to focus on controlling your diabetes during the weeks before surgery. . Make sure that the doctor who takes care of your diabetes knows about your planned surgery including the date and location.  How do I manage my blood sugar before surgery? . Check your blood sugar at least 4 times a day, starting 2 days before surgery, to make sure that the level is not too high or low. o Check your blood sugar the morning of your surgery when you wake up and every 2 hours until you get to the Short Stay unit. . If your blood sugar is less than 70 mg/dL, you will need to treat for low blood sugar: o Do not take insulin. o Treat a low blood sugar (less than 70 mg/dL) with  cup of clear juice (cranberry or apple), 4 glucose tablets, OR glucose gel. o Recheck blood sugar in 15 minutes after treatment (to make sure it is greater than 70 mg/dL). If your blood sugar is not greater than 70 mg/dL on recheck, call [phone] for further instructions. . Report your blood sugar to the short stay nurse when you get to Short Stay.  . If you are admitted to the hospital after surgery: o Your blood sugar will be checked by the staff and you will probably be given insulin after surgery (instead of oral diabetes medicines) to make sure you have good blood  sugar levels. o The goal for blood sugar control after surgery is 80-180 mg/dL.   WHAT DO I DO ABOUT MY DIABETES MEDICATION?  Marland Kitchen Do not take oral diabetes medicines (pills) the morning of surgery.  . THE DAY BEFORE SURGERY:  Take Januvia as prescribed.       . THE MORNING OF SURGERY:  Do not take Januvia                  You may not have any metal on your body including jewelry, and body piercings             Do not wear lotions, powders, perfumes/cologne, or deodorant             Men may shave face and neck.   Do not bring valuables to the hospital. [place].   Contacts, dentures or bridgework may not be worn into surgery.     Patients discharged the day of surgery will not be allowed to drive home.               Please read over the following fact sheets you were given: IF YOU HAVE QUESTIONS ABOUT YOUR PRE OP INSTRUCTIONS PLEASE CALL [phone]    [facility] - Preparing for Surgery Before surgery, you can play an important role.  Because skin  is not sterile, your skin needs to be as free of germs as possible.  You can reduce the number of germs on your skin by washing with CHG (chlorahexidine gluconate) soap before surgery.  CHG is an antiseptic cleaner which kills germs and bonds with the skin to continue killing germs even after washing. Please DO NOT use if you have an allergy to CHG or antibacterial soaps.  If your skin becomes reddened/irritated stop using the CHG and inform your nurse when you arrive at Short Stay. Do not shave (including legs and underarms) for at least 48 hours prior to the first CHG shower.  You may shave your face/neck.  Please follow these instructions carefully:  1.  Shower with CHG Soap the night before surgery and the  morning of surgery.  2.  If you choose to wash your hair, wash your hair first as usual with your normal  shampoo.  3.  After you shampoo, rinse your hair and body thoroughly to remove the  shampoo.                             4.  Use CHG as you would any other liquid soap.  You can apply chg directly to the skin and wash.  Gently with a scrungie or clean washcloth.  5.  Apply the CHG Soap to your body ONLY FROM THE NECK DOWN.   Do   not use on face/ open                           Wound or open sores. Avoid contact with eyes, ears mouth and   genitals (private parts).                       Wash face,  Genitals (private parts) with your normal soap.             6.  Wash thoroughly, paying special attention to the area where your    surgery  will be performed.  7.  Thoroughly rinse your body with warm water from the neck down.  8.  DO NOT shower/wash with your normal soap after using and rinsing off the CHG Soap.                9.  Pat yourself dry with a clean towel.            10.  Wear clean pajamas.            11.  Place clean sheets on your bed the night of your first shower and do not  sleep with pets. Day of Surgery : Do not apply any lotions/deodorants the morning of surgery.  Please wear clean clothes to the hospital/surgery center.  FAILURE TO FOLLOW THESE INSTRUCTIONS MAY RESULT IN THE CANCELLATION OF YOUR SURGERY  PATIENT SIGNATURE_________________________________  NURSE SIGNATURE__________________________________  ________________________________________________________________________

## 2020-07-23 NOTE — Progress Notes (Signed)
COVID Vaccine Completed: Date COVID Vaccine completed: COVID vaccine manufacturer: [employer]   PCP - Letta Median, DO Cardiologist - Minus Breeding, MD  Clearance in [place] dated 07-02-20 from Kerin Ransom, [place]  Chest x-ray -  EKG - 12-05-19 in Epic Stress Test -  ECHO - 11-22-16 in Epic Cardiac Cath - 11-21-16 in Epic Pacemaker/ICD device last checked:  Sleep Study -  CPAP -   Fasting Blood Sugar -  Checks Blood Sugar _____ times a day  Blood Thinner Instructions:  Eliquis 5 mg.  Can hold 3 days preop Aspirin Instructions: Last Dose:  Anesthesia review:  Afib, CHF, CAD, NSTEMI  Patient denies shortness of breath, fever, cough and chest pain at PAT appointment   Patient verbalized understanding of instructions that were given to them at the PAT appointment. Patient was also instructed that they will need to review over the PAT instructions again at home before surgery.

## 2020-08-02 ENCOUNTER — Encounter (HOSPITAL_COMMUNITY)
Admission: RE | Admit: 2020-08-02 | Discharge: 2020-08-02 | Disposition: A | Discharge status: Home / Self Care | Payer: Medicare Other | Source: Ambulatory Visit | Attending: Anesthesiology | Admitting: Anesthesiology

## 2020-08-02 NOTE — Progress Notes (Signed)
DUE TO COVID-19 ONLY ONE VISITOR IS ALLOWED TO COME WITH YOU AND STAY IN THE WAITING ROOM ONLY DURING PRE OP AND PROCEDURE DAY OF SURGERY. THE 1 VISITOR  MAY VISIT WITH YOU AFTER SURGERY IN YOUR PRIVATE ROOM DURING VISITING HOURS ONLY!  YOU NEED TO HAVE A COVID 19 TEST ON_9/27/2021 ______ @_______ , THIS TEST MUST BE DONE BEFORE SURGERY,  COVID TESTING SITE [address] [place] [address], IT IS ON THE RIGHT GOING OUT [address] APPROXIMATELY  2 MINUTES PAST [employer] ON THE RIGHT. ONCE YOUR COVID TEST IS COMPLETED,  PLEASE BEGIN THE QUARANTINE INSTRUCTIONS AS OUTLINED IN YOUR HANDOUT.                Adam Harris  08/02/2020   Your procedure is scheduled on: 08/12/2020    Report to [facility] Main  Entrance   Report to admitting at    [place] AM     Call this number if you have problems the morning of surgery [phone]    Remember: Do not eat food , candy gum or mints :After Midnight. You may have clear liquids from midnight until 0430am  Fleets enema 2-3 hours prior to surgery   CLEAR LIQUID DIET   Foods Allowed                                                                       Coffee and tea, regular and decaf                              Plain Jell-O any favor except red or purple                                            Fruit ices (not with fruit pulp)                                      Iced Popsicles                                     Carbonated beverages, regular and diet                                    Cranberry, grape and apple juices Sports drinks like Gatorade Lightly seasoned clear broth or consume(fat free) Sugar, honey syrup   _____________________________________________________________________    BRUSH YOUR TEETH MORNING OF SURGERY AND RINSE YOUR MOUTH OUT, NO CHEWING GUM CANDY OR MINTS.     Take these medicines the morning of surgery with A SIP OF WATER:  Imdur, Metoiprolol  DO NOT TAKE ANY DIABETIC MEDICATIONS DAY  OF YOUR SURGERY                               You may  not have any metal on your body including hair pins and              piercings  Do not wear jewelry, make-up, lotions, powders or perfumes, deodorant             Do not wear nail polish on your fingernails.  Do not shave  48 hours prior to surgery.              Men may shave face and neck.   Do not bring valuables to the hospital. [place].  Contacts, dentures or bridgework may not be worn into surgery.  Leave suitcase in the car. After surgery it may be brought to your room.     Patients discharged the day of surgery will not be allowed to drive home. IF YOU ARE HAVING SURGERY AND GOING HOME THE SAME DAY, YOU MUST HAVE AN ADULT TO DRIVE YOU HOME AND BE WITH YOU FOR 24 HOURS. YOU MAY GO HOME BY TAXI OR UBER OR ORTHERWISE, BUT AN ADULT MUST ACCOMPANY YOU HOME AND STAY WITH YOU FOR 24 HOURS.  Name and phone number of your driver:  Special Instructions: N/A              Please read over the following fact sheets you were given: _____________________________________________________________________  [facility] - Preparing for Surgery Before surgery, you can play an important role.  Because skin is not sterile, your skin needs to be as free of germs as possible.  You can reduce the number of germs on your skin by washing with CHG (chlorahexidine gluconate) soap before surgery.  CHG is an antiseptic cleaner which kills germs and bonds with the skin to continue killing germs even after washing. Please DO NOT use if you have an allergy to CHG or antibacterial soaps.  If your skin becomes reddened/irritated stop using the CHG and inform your nurse when you arrive at Short Stay. Do not shave (including legs and underarms) for at least 48 hours prior to the first CHG shower.  You may shave your face/neck. Please follow these instructions carefully:  1.  Shower with CHG Soap the night before surgery  and the  morning of Surgery.  2.  If you choose to wash your hair, wash your hair first as usual with your  normal  shampoo.  3.  After you shampoo, rinse your hair and body thoroughly to remove the  shampoo.                           4.  Use CHG as you would any other liquid soap.  You can apply chg directly  to the skin and wash                       Gently with a scrungie or clean washcloth.  5.  Apply the CHG Soap to your body ONLY FROM THE NECK DOWN.   Do not use on face/ open                           Wound or open sores. Avoid contact with eyes, ears mouth and genitals (private parts).  Wash face,  Genitals (private parts) with your normal soap.             6.  Wash thoroughly, paying special attention to the area where your surgery  will be performed.  7.  Thoroughly rinse your body with warm water from the neck down.  8.  DO NOT shower/wash with your normal soap after using and rinsing off  the CHG Soap.                9.  Pat yourself dry with a clean towel.            10.  Wear clean pajamas.            11.  Place clean sheets on your bed the night of your first shower and do not  sleep with pets. Day of Surgery : Do not apply any lotions/deodorants the morning of surgery.  Please wear clean clothes to the hospital/surgery center.  FAILURE TO FOLLOW THESE INSTRUCTIONS MAY RESULT IN THE CANCELLATION OF YOUR SURGERY PATIENT SIGNATURE_________________________________  NURSE SIGNATURE__________________________________  ________________________________________________________________________

## 2020-08-05 ENCOUNTER — Other Ambulatory Visit: Payer: Self-pay

## 2020-08-05 ENCOUNTER — Encounter (HOSPITAL_COMMUNITY)
Admission: RE | Admit: 2020-08-05 | Discharge: 2020-08-05 | Disposition: A | Discharge status: Home / Self Care | Payer: Medicare Other | Source: Ambulatory Visit | Attending: Urology | Admitting: Urology

## 2020-08-05 ENCOUNTER — Encounter (HOSPITAL_COMMUNITY): Payer: Self-pay

## 2020-08-05 DIAGNOSIS — E119 Type 2 diabetes mellitus without complications: Secondary | ICD-10-CM | POA: Diagnosis not present

## 2020-08-05 DIAGNOSIS — Z01818 Encounter for other preprocedural examination: Secondary | ICD-10-CM | POA: Diagnosis not present

## 2020-08-05 LAB — BASIC METABOLIC PANEL
Anion gap: 8 (ref 5–15)
BUN: 45 mg/dL — ABNORMAL HIGH (ref 8–23)
CO2: 26 mmol/L (ref 22–32)
Calcium: 9.1 mg/dL (ref 8.9–10.3)
Chloride: 108 mmol/L (ref 98–111)
Creatinine, Ser: 2.29 mg/dL — ABNORMAL HIGH (ref 0.61–1.24)
GFR calc Af Amer: 32 mL/min — ABNORMAL LOW (ref >60)
GFR calc non Af Amer: 27 mL/min — ABNORMAL LOW (ref >60)
Glucose, Bld: 105 mg/dL — ABNORMAL HIGH (ref 70–99)
Potassium: 4.5 mmol/L (ref 3.5–5.1)
Sodium: 142 mmol/L (ref 135–145)

## 2020-08-05 LAB — CBC
HCT: 47.1 % (ref 39.0–52.0)
Hemoglobin: 15.2 g/dL (ref 13.0–17.0)
MCH: 29.2 pg (ref 26.0–34.0)
MCHC: 32.3 g/dL (ref 30.0–36.0)
MCV: 90.4 fL (ref 80.0–100.0)
Platelets: 159 10*3/uL (ref 150–400)
RBC: 5.21 MIL/uL (ref 4.22–5.81)
RDW: 14.4 % (ref 11.5–15.5)
WBC: 7.7 10*3/uL (ref 4.0–10.5)
nRBC: 0 % (ref 0.0–0.2)

## 2020-08-05 LAB — HEMOGLOBIN A1C
Hgb A1c MFr Bld: 6.6 % — ABNORMAL HIGH (ref 4.8–5.6)
Mean Plasma Glucose: 142.72 mg/dL

## 2020-08-05 LAB — GLUCOSE, CAPILLARY: Glucose-Capillary: 101 mg/dL — ABNORMAL HIGH (ref 70–99)

## 2020-08-05 NOTE — Progress Notes (Addendum)
Anesthesia Review:  PCP: Cardiologist :dR Hpchrein  LOV- 07/02/20 with Kerin Ransom [facility]  Chest x-ray : EKG :12/05/19  Echo :2018  Stress test: Cardiac Cath : 2018  Activity level: can do a flight of stairs without difficulty  Sleep Study/ CPAP :no Fasting Blood Sugar :      / Checks Blood Sugar -- times a day:   Blood Thinner/ Instructions /Last Dose: ASA / Instructions/ Last Dose :  DM-type 2 glucose 101 at preop  HGBA1C- 6.6 on 08/05/20.  Pt on Eliquis - per pt to stop 2 days prior to surgery  Pt did not take am blood pressure meds prior to preop visit.  Pt to return home to take meds.  BMP done 08/05/20 routed via epic to DR [name].  Takes care of wife at home and helps to take care of 16 year old mother.

## 2020-08-05 NOTE — Progress Notes (Signed)
Anesthesia Chart Review   Case: [id number] Date/Time: 08/12/20 0715   Procedures:      CYSTOSCOPY WITH BLADDER BIOPSY VERSUS TRANSURETHRAL RESECTION OF BLADDER TUMOR (N/A )     BIOPSY TRANSRECTAL ULTRASONIC PROSTATE (TUBP) (N/A )   Anesthesia type: General   Pre-op diagnosis: BLADDER CANCER, PROSTATE CANCER   Location: [facility]OM 03 / [facility]   Surgeons: [name], MD      DISCUSSION:74 y.o. never smoker with h/o HTN, a-fib (on Eliquis), CAD, chronic diastolic heart failure, DM II, CKD Stage III, bladder cancer, prostate cancer scheduled for above procedure 08/12/2020 with Dr. [name].   NSTEMI 02-04-2014 per cardiac cath chronic occluded RCA w/ faint left-to-right collaterals and aneurysmal LCFx with sluggish coronary flow/ NSTEMI --11-21-2016 per cardiac cath occluded proximal RCA &mid to diastal CFX 100%, med rx. If that does not work, PTCA or CABG  Per cardiology preoperative evaluation 07/05/2020, "Chart reviewed and patient contacted by phone today as part of pre-operative protocol coverage. Given past medical history and time since last visit, based on ACC/AHA guidelines, Adam Harris would be at acceptable risk for the planned procedure without further cardiovascular testing.  OK to hold Eliquis 3 days pre op if needed-resume as soon as possible post op."  Anticipate pt can proceed with planned procedure barring acute status change.    VS: BP (!) 142/99   Pulse (!) 59   Temp 36.9 C (Oral)   Ht 5\' 11"  (1.803 m)   Wt 96.2 kg   SpO2 99%   BMI 29.57 kg/m   PROVIDERS: Ronnald Nian, DO is PCP   Minus Breeding, MD is Cardiologist  LABS: Labs reviewed: Acceptable for surgery. (all labs ordered are listed, but only abnormal results are displayed)  Labs Reviewed  BASIC METABOLIC PANEL - Abnormal; Notable for the following components:      Result Value   Glucose, Bld 105 (*)    BUN 45 (*)    Creatinine, Ser 2.29 (*)    GFR calc non Af Amer 27 (*)    GFR calc Af  Amer 32 (*)    All other components within normal limits  HEMOGLOBIN A1C - Abnormal; Notable for the following components:   Hgb A1c MFr Bld 6.6 (*)    All other components within normal limits  GLUCOSE, CAPILLARY - Abnormal; Notable for the following components:   Glucose-Capillary 101 (*)    All other components within normal limits  CBC     IMAGES:   EKG: 12/05/19 Rate 78 bpm  Atrial fibrillation  Nonspecific ST and T wave abnormality   CV: Echo 11/22/2016 Study Conclusions   - Left ventricle: The cavity size was normal. Systolic function was  normal. The estimated ejection fraction was in the range of 55%  to 60%. Wall motion was normal; there were no regional wall  motion abnormalities.  - Aortic valve: Trileaflet; moderately thickened, moderately  calcified leaflets.  - Mitral valve: There was mild regurgitation.  - Left atrium: The atrium was moderately dilated.  - Tricuspid valve: There was mild regurgitation.  - Pulmonary arteries: Systolic pressure was mildly increased. PA  peak pressure: 37 mm Hg (S).   Impressions:   - Compared to the prior study, there has been no significant  interval change. Past Medical History:  Diagnosis Date  . Abnormal radiologic findings on diagnostic imaging of renal pelvis, ureter, or bladder    bilateral ureter abnormalities  . Anticoagulant long-term use    eliquis  .  Anxiety   . Arthritis   . CAD (coronary artery disease) cardiologist-- dr hochrein   NSTEMI 02-04-2014  per cardiac cath chronic occluded RCA w/ faint left-to-right collaterals and aneurysmal LCFx with sluggish coronary flow/   NSTEMI --11-21-2016 per cardiac cath occluded proximal RCA & mid to diastal CFX 100%, med rx. If that does not work, PTCA or CABG  . Chronic diastolic heart failure ([place])   . CKD (chronic kidney disease), stage III    patient unaware  . DOE (dyspnea on exertion)    2-3 flights of stairs  . Dysrhythmia 2019   A-Fib  .  Fatty liver    pt denies  . Hematuria   . Hematuria 02/2019  . History of COVID-19 10/2019  . History of non-ST elevation myocardial infarction (NSTEMI)    02-04-2014  and 11-21-2016  cardiac cath done both times ,  medically management  . History of shingles 12/2017   slight pain and numbness still noted in the area  . Hyperlipidemia   . Hypertension   . Insomnia   . Myocardial infarction ([place]) 2015  . Persistent atrial fibrillation [facility])    cardiologsit-- dr Percival Spanish  . Sciatica   . Thoracic aortic atherosclerosis ([place])   . Type 2 diabetes mellitus ([place])   . Urinary frequency     Past Surgical History:  Procedure Laterality Date  . CARDIAC CATHETERIZATION N/A 11/21/2016   Procedure: Left Heart Cath and Coronary Angiography;  Surgeon: Troy Sine, MD;  Location: [place];  Service: Cardiovascular;  Laterality: N/A;  pRCA 100% , ostial LAD 45%, OM3 80%, mCFX to dCFX 100% (AV groove), lateral OM3 50%  . COLONOSCOPY    . CYSTOSCOPY WITH URETEROSCOPY AND STENT PLACEMENT Right 12/15/2019   Procedure: CYSTOSCOPY WITH RIGHT RETROGRADE/ RIGHT URETEROSCOPY/ BIOPSY;  Surgeon: [name], MD;  Location: [facility];  Service: Urology;  Laterality: Right;  . CYSTOSCOPY/RETROGRADE/URETEROSCOPY Bilateral 03/18/2018   Procedure: CYSTOSCOPY/RETROGRADE/URETEROSCOPY.;  Surgeon: [name], MD;  Location: [facility];  Service: Urology;  Laterality: Bilateral;  . KNEE ARTHROSCOPY Right   . LEFT HEART CATHETERIZATION WITH CORONARY ANGIOGRAM N/A 02/04/2014   Procedure: LEFT HEART CATHETERIZATION WITH CORONARY ANGIOGRAM;  Surgeon: [name], MD;  Location: [place] [facility];  Service: Cardiovascular;  Laterality: N/A;  severe one-vessel CAD, chronically occluded RCA with faint left-to-right collaterals;  aneurysmal LCFx with sluggish coronary flow;  normal LVSF w/ moderately elevated LVEDP (ostialOM2 20%, pOM3 20%, pD1 20%, mCFX 50%, diffuse 20% pCFX)  . ROBOT  ASSITED LAPAROSCOPIC NEPHROURETERECTOMY Right 03/22/2020   Procedure: XI ROBOT ASSITED LAPAROSCOPIC NEPHROURETERECTOMY;  Surgeon: [name], MD;  [facility];  Service: Urology;  Laterality: Right;  . TRANSTHORACIC ECHOCARDIOGRAM  11-22-2016   dr hochrein   ef 55-60%/ mild MR and TR/ moderate LAE    MEDICATIONS: . diphenhydramine-acetaminophen (TYLENOL PM) 25-500 MG TABS tablet  . ELIQUIS 5 MG TABS tablet  . furosemide (LASIX) 20 MG tablet  . isosorbide mononitrate (IMDUR) 30 MG 24 hr tablet  . metoprolol tartrate (LOPRESSOR) 25 MG tablet  . nitroGLYCERIN (NITROSTAT) 0.4 MG SL tablet  . Olmesartan-amLODIPine-HCTZ 40-10-12.5 MG TABS  . phenazopyridine (PYRIDIUM) 200 MG tablet  . rosuvastatin (CRESTOR) 40 MG tablet  . sitaGLIPtin (JANUVIA) 100 MG tablet  . sulfamethoxazole-trimethoprim (BACTRIM DS) 800-160 MG tablet  . traMADol (ULTRAM) 50 MG tablet   No current facility-administered medications for this encounter.   . sodium phosphate (FLEET) 7-19 GM/118ML enema 1 enema  Konrad Felix, PA-C [facility]Surgical Testing [phone]

## 2020-08-05 NOTE — Anesthesia Preprocedure Evaluation (Addendum)
Anesthesia Evaluation  Patient identified by MRN, date of birth, ID band  Reviewed: Allergy & Precautions, NPO status , Patient's Chart, lab work & pertinent test results  Airway Mallampati: II  TM Distance: >3 FB Neck ROM: Full    Dental  (+) Edentulous Upper, Edentulous Lower   Pulmonary neg pulmonary ROS,    Pulmonary exam normal breath sounds clear to auscultation       Cardiovascular hypertension, + angina + CAD, + Past MI, +CHF and + DOE  + dysrhythmias  Rhythm:Regular Rate:Normal  Per cardiology preoperative evaluation 07/05/2020, "Chart reviewedand patient contacted by phone todayas part of pre-operative protocol coverage. Given past medical history and time since last visit, based on ACC/AHA guidelines,Adam Tarowould be at acceptable risk for the planned procedure without further cardiovascular testing.  OK to hold Eliquis 3 days pre op if needed-resume as soon as possible post op.   - Left ventricle: The cavity size was normal. Systolic function was  normal. The estimated ejection fraction was in the range of 55%  to 60%. Wall motion was normal; there were no regional wall  motion abnormalities.  - Aortic valve: Trileaflet; moderately thickened, moderately  calcified leaflets.  - Mitral valve: There was mild regurgitation.  - Left atrium: The atrium was moderately dilated.  - Tricuspid valve: There was mild regurgitation.  - Pulmonary arteries: Systolic pressure was mildly increased. PA  peak pressure: 37 mm Hg (S).    Neuro/Psych  Headaches, Anxiety    GI/Hepatic negative GI ROS, Neg liver ROS,   Endo/Other  diabetes  Renal/GU Renal InsufficiencyRenal disease  negative genitourinary   Musculoskeletal  (+) Arthritis ,   Abdominal Normal abdominal exam  (+)   Peds negative pediatric ROS (+)  Hematology negative hematology ROS (+)   Anesthesia Other Findings   Reproductive/Obstetrics negative  OB ROS                           Anesthesia Physical Anesthesia Plan  ASA: III  Anesthesia Plan: General   Post-op Pain Management:    Induction: Intravenous  PONV Risk Score and Plan: 2  Airway Management Planned: LMA  Additional Equipment: None  Intra-op Plan:   Post-operative Plan:   Informed Consent: I have reviewed the patients History and Physical, chart, labs and discussed the procedure including the risks, benefits and alternatives for the proposed anesthesia with the patient or authorized representative who has indicated his/her understanding and acceptance.       Plan Discussed with:   Anesthesia Plan Comments:       Anesthesia Quick Evaluation

## 2020-08-09 ENCOUNTER — Inpatient Hospital Stay (HOSPITAL_COMMUNITY): Admission: RE | Admit: 2020-08-09 | Payer: Medicare Other | Source: Ambulatory Visit

## 2020-08-11 ENCOUNTER — Other Ambulatory Visit (HOSPITAL_COMMUNITY)
Admission: RE | Admit: 2020-08-11 | Discharge: 2020-08-11 | Disposition: A | Discharge status: Home / Self Care | Payer: Medicare Other | Source: Ambulatory Visit | Attending: Urology | Admitting: Urology

## 2020-08-11 DIAGNOSIS — Z20822 Contact with and (suspected) exposure to covid-19: Secondary | ICD-10-CM | POA: Diagnosis not present

## 2020-08-11 DIAGNOSIS — Z01812 Encounter for preprocedural laboratory examination: Secondary | ICD-10-CM | POA: Insufficient documentation

## 2020-08-11 LAB — SARS CORONAVIRUS 2 (TAT 6-24 HRS): SARS Coronavirus 2: NEGATIVE

## 2020-08-11 NOTE — H&P (Signed)
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 Carcinoma in situ of the bladder  3. Prostate cancer   [name] returns today now approximately 3 months out from his right nephroureterectomy. He denies any hematuria or voiding complaints. He is scheduled for surveillance cystoscopy today. He also scheduled to follow-up on his prostate cancer surveillance.     ALLERGIES: No Allergies    MEDICATIONS: Atorvastatin Calcium 20 MG Oral Tablet Oral  Eliquis 5 mg tablet Oral  MetFORMIN HCl - 500 MG Oral Tablet Oral  Metoprolol Tartrate 25 MG Oral Tablet Oral  Nitrostat 0.4 MG Sublingual Tablet Sublingual Sublingual  Tribenzor 40 mg-10 mg-25 mg tablet Oral     GU PSH: Bladder Instill AntiCA Agent - 02/18/2020, 02/11/2020, 02/04/2020, 01/28/2020, 01/21/2020, 01/14/2020 Cysto Bladder Ureth Biopsy - 12/15/2019 Cysto Uretero Biopsy Fulgura, Right - 12/15/2019 Cystoscopy - 03/05/2020, 2019 Cystoscopy Ureteroscopy, Bilateral - 2019 Inject For cystogram - 03/26/2020 Lap Nephro Ureterectomy, Right - 03/22/2020 Locm 300-399Mg /Ml Iodine,1Ml - 01/14/2020, 11/24/2019, 2019 Prostate Needle Biopsy - 2018       PSH Notes: Knee Arthroscopy   NON-GU PSH: Surgical Pathology, Gross And Microscopic Examination For Prostate Needle - 2018     GU PMH: Renal pelvis cancer, right (Stable) - 04/20/2020, Right, - 12/15/2019 CIS of the bladder - 12/15/2019 Gross hematuria, Continues to have intermittent gross hematuria. It seems to be associated with some pain in his right flank region but no history of stones. With his previous abnormality of his ureter I have recommended we repeat a CT scan with and without contrast and then will determine further evaluation once that is been completed. - 10/21/2019, He experienced gross hematuria. He is on Eliquis. My suspicion is that this is going to be from the prostate since no other abnormality was noted previously., - 02/27/2019 Hydronephrosis (Stable), Right, He has mild right  hydronephrosis noted on his CT scan that is unchanged and has been evaluated and noted to be nonobstructive in nature. - 02/27/2019 Prostate Cancer (Stable), His prostate remains benign to exam. I will obtain a PSA and have recommended he return in 6 months for repeat DRE and PSA. - 02/27/2019, (Stable), His prostate was noted to be entirely benign today. I will obtain a PSA today since he has due for a recheck., - 2019 (Stable), I went over his pathology report with him today which has revealed no evidence of grade or stage progression of his low risk prostate cancer. My recommendation to him has been continued active surveillance and he has agreed with this plan., - 2018 (Stable), I have discussed with the patient the possibility of blood per rectum, per urethra and in the ejaculate. He was counseled to contact me if he has any difficulties following his prostate biopsy whatsoever., - 2018 (Stable), His prostate was noted to be smooth and benign to examination however his PSA has risen significantly. It was 8.3 when we did his biopsy initially and then came down quite low. I recommended we repeat the PSA today but also I told him this time we proceed with a repeat biopsy., - 2018 (Stable), His prostate remains benign on his examination and his PSA continues to remain low and stable. I will continue active surveillance with DRE and PSA again in 6 months., - 2017, Adenocarcinoma of prostate, - 2017 Abnormal radiologic findings on diagnostic imaging of of renal pelvis, ureter, or bladder, Bilateral, Due to the finding of abnormalities of both his right ureter and left renal  pelvic region we are going to proceed with further evaluation with cystoscopy, bilateral retrograde pyelography, bilateral ureteroscopy and possible bilateral double-J stent placement. - 2019 Microscopic hematuria, He was noted to have microscopic hematuria today. We therefore have discussed the need for further evaluation. He did not appear to  have any infection but did have a few white cells so I am going to culture his urine. We will then obtain a creatinine and schedule him for a CT scan to evaluate the upper tract and will then have him return for lower tract evaluation with cystoscopy. - 2019 BPH w/o LUTS (Stable), He does have some slight prostatic enlargement on exam but does not have significant voiding symptoms. - 2018 BPH w/LUTS (Stable), He has some BPH by exam with nocturia 2 but he said it is not significant enough that he would want to consider any form of pharmacologic therapy at this time. - 2017, Benign prostatic hyperplasia with urinary obstruction, - 2016 Nocturia (Stable), His nocturia is not a significant bother to him. 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   Diagnosis: Extensive CIS of the right renal pelvis and ureter  Baseline renal function: Cr 1.77   2) Prostate cancer: His PSA in 1/13 was found to be 8.33. He had no worrisome nodularity or induration noted on DRE but did have a mild rectal stricture. He underwent TRUS/BX on 01/18/12 at which time his prostate was noted to be 56 cc.  Pathology: He was found to have a single core positive for Gleason 3+3 = 6 adenocarcinoma involving 10% of that core.  Treatment: Active surveillance  Repeat TRUS/BX 07/24/17: Prostate volume - 57 cc  Pathology: 1 positive for Gleason 6 in 5% from the left apex laterally.  Treatment: Continued active surveillance.   3) BPH with LUTS: He reported having some slowing of his urinary stream as well as nocturia.  Treatment: None currently   4) Left hydrocele: At one point he found it to be a bother but he says it really not giving him any trouble other than occasionally when he crosses his legs. It is not increasing in size.       NON-GU  PMH: Neoplasm of uncertain behavior of trachea, bronchus and lung - 01/06/2020 Encounter for general adult medical examination without abnormal findings, Encounter for preventive health examination - 2017 Atherosclerotic heart disease of native coronary artery without angina pectoris, CAD (coronary artery disease) - 2016 Personal history of other diseases of the circulatory system, History of angina pectoris - 2016, History of atrial fibrillation, - 2016, History of hypertension, - 2014 Personal history of other endocrine, nutritional and metabolic disease, History of type 2 diabetes mellitus - 2016, History of hypercholesterolemia, - 2016 Stenosis of anus and rectum, Rectal stricture - 2016    FAMILY HISTORY: Family Health Status - Mother's Age - Runs In Family Family Health Status Number - Runs In Family Father Deceased At [place] ___ - Runs In Family Hypertension - Mother   SOCIAL HISTORY: Marital Status: Married Preferred Language: English; Race: White Has never drank.  Drinks 3 caffeinated drinks per day.     Notes: Former smoker, Occupation:, Marital History - Currently Married, Caffeine Use, Alcohol Use   REVIEW OF SYSTEMS:    GU Review Male:   Patient denies frequent urination,  hard to postpone urination, burning/ pain with urination, get up at night to urinate, leakage of urine, stream starts and stops, trouble starting your streams, and have to strain to urinate .  Gastrointestinal (Lower):   Patient denies diarrhea and constipation.  Gastrointestinal (Upper):   Patient denies nausea and vomiting.  Constitutional:   Patient denies fever, night sweats, weight loss, and fatigue.  Skin:   Patient denies skin rash/ lesion and itching.  Eyes:   Patient denies blurred vision and double vision.  Ears/ Nose/ Throat:   Patient denies sinus problems and sore throat.  Hematologic/Lymphatic:   Patient denies swollen glands and easy bruising.  Cardiovascular:   Patient denies leg swelling and  chest pains.  Respiratory:   Patient denies cough and shortness of breath.  Endocrine:   Patient denies excessive thirst.  Musculoskeletal:   Patient denies back pain and joint pain.  Neurological:   Patient denies headaches and dizziness.  Psychologic:   Patient denies depression and anxiety.   VITAL SIGNS:     Weight 206 lb / 93.44 kg  BMI 28.7 kg/m    MULTI-SYSTEM PHYSICAL EXAMINATION:    Constitutional: Well-nourished. No physical deformities. Normally developed. Good grooming.  Respiratory: No labored breathing, no use of accessory muscles. Clear bilaterally.  Cardiovascular: Normal temperature, normal extremity pulses, no swelling, no varicosities. Regular rate and rhythm.  Neurologic / Psychiatric: Oriented to time, oriented to place, oriented to person. No depression, no anxiety, no agitation.     Complexity of Data:  Records Review:   Previous Patient Records   06/15/20 10/21/19 02/27/19 01/08/18 07/11/17 06/07/17 07/19/16 12/14/15  PSA  Total PSA 8.69 ng/mL 2.39 ng/mL 3.27 ng/mL 1.67 ng/mL 4.28 ng/mL 10.80 ng/mL 1.92 ng/dl 1.68      ASSESSMENT:      ICD-10 Details  1 GU:   CIS of the bladder - D09.0   2   Prostate Cancer - C61   3   Renal pelvis cancer, right - C65.1   4 NON-GU:   Bacteriuria - R82.71    PLAN:     1. Urothelial carcinoma of the right renal pelvis and ureter/carcinoma in situ of the bladder: We discussed his findings on cystoscopy that are suspicious. A bladder washing has been obtained for cytology. I have recommended that he proceed with cystoscopy under anesthesia with bladder biopsy vs. transurethral resection for further definitive evaluation. He has completed induction BCG although did not start maintenance therapy after his last cystoscopy due to his need to proceed with his right nephrectomy. We have reviewed this procedure in detail including the potential risks and the expected recovery process.   2. Prostate cancer: We reviewed his PSA  which is significantly increased from baseline. This is concerning and have recommended that we proceed with a repeat prostate biopsy at the time he is in the operating room for further definitive evaluation. It has been 3 years since his last biopsy by Dr. Karsten Ro.

## 2020-08-12 ENCOUNTER — Encounter (HOSPITAL_COMMUNITY)
Admission: RE | Disposition: A | Discharge status: Home / Self Care | Payer: Self-pay | Source: Home / Self Care | Attending: Urology

## 2020-08-12 ENCOUNTER — Telehealth (HOSPITAL_COMMUNITY): Payer: Self-pay | Admitting: *Deleted

## 2020-08-12 ENCOUNTER — Ambulatory Visit (HOSPITAL_COMMUNITY): Payer: Medicare Other | Admitting: Physician Assistant

## 2020-08-12 ENCOUNTER — Ambulatory Visit (HOSPITAL_COMMUNITY)
Admission: RE | Admit: 2020-08-12 | Discharge: 2020-08-12 | Disposition: A | Discharge status: Home / Self Care | Payer: Medicare Other | Source: Ambulatory Visit | Attending: Urology | Admitting: Urology

## 2020-08-12 ENCOUNTER — Encounter (HOSPITAL_COMMUNITY): Payer: Self-pay | Admitting: Urology

## 2020-08-12 ENCOUNTER — Ambulatory Visit (HOSPITAL_COMMUNITY)
Admission: RE | Admit: 2020-08-12 | Discharge: 2020-08-12 | Disposition: A | Discharge status: Home / Self Care | Payer: Medicare Other | Attending: Urology | Admitting: Urology

## 2020-08-12 ENCOUNTER — Ambulatory Visit (HOSPITAL_COMMUNITY): Payer: Medicare Other | Admitting: Anesthesiology

## 2020-08-12 DIAGNOSIS — I509 Heart failure, unspecified: Secondary | ICD-10-CM | POA: Diagnosis not present

## 2020-08-12 DIAGNOSIS — N401 Enlarged prostate with lower urinary tract symptoms: Secondary | ICD-10-CM | POA: Insufficient documentation

## 2020-08-12 DIAGNOSIS — D494 Neoplasm of unspecified behavior of bladder: Secondary | ICD-10-CM | POA: Diagnosis not present

## 2020-08-12 DIAGNOSIS — N4289 Other specified disorders of prostate: Secondary | ICD-10-CM | POA: Diagnosis not present

## 2020-08-12 DIAGNOSIS — C679 Malignant neoplasm of bladder, unspecified: Secondary | ICD-10-CM | POA: Diagnosis not present

## 2020-08-12 DIAGNOSIS — Z87891 Personal history of nicotine dependence: Secondary | ICD-10-CM | POA: Diagnosis not present

## 2020-08-12 DIAGNOSIS — Z79899 Other long term (current) drug therapy: Secondary | ICD-10-CM | POA: Insufficient documentation

## 2020-08-12 DIAGNOSIS — I4891 Unspecified atrial fibrillation: Secondary | ICD-10-CM | POA: Diagnosis not present

## 2020-08-12 DIAGNOSIS — C61 Malignant neoplasm of prostate: Secondary | ICD-10-CM

## 2020-08-12 DIAGNOSIS — I110 Hypertensive heart disease with heart failure: Secondary | ICD-10-CM | POA: Insufficient documentation

## 2020-08-12 DIAGNOSIS — I5032 Chronic diastolic (congestive) heart failure: Secondary | ICD-10-CM | POA: Diagnosis not present

## 2020-08-12 DIAGNOSIS — I2510 Atherosclerotic heart disease of native coronary artery without angina pectoris: Secondary | ICD-10-CM | POA: Diagnosis not present

## 2020-08-12 DIAGNOSIS — Z7984 Long term (current) use of oral hypoglycemic drugs: Secondary | ICD-10-CM | POA: Diagnosis not present

## 2020-08-12 DIAGNOSIS — C678 Malignant neoplasm of overlapping sites of bladder: Secondary | ICD-10-CM | POA: Insufficient documentation

## 2020-08-12 DIAGNOSIS — E119 Type 2 diabetes mellitus without complications: Secondary | ICD-10-CM | POA: Insufficient documentation

## 2020-08-12 DIAGNOSIS — Z7901 Long term (current) use of anticoagulants: Secondary | ICD-10-CM | POA: Insufficient documentation

## 2020-08-12 DIAGNOSIS — R3912 Poor urinary stream: Secondary | ICD-10-CM | POA: Diagnosis not present

## 2020-08-12 DIAGNOSIS — D090 Carcinoma in situ of bladder: Secondary | ICD-10-CM | POA: Diagnosis not present

## 2020-08-12 DIAGNOSIS — Z8553 Personal history of malignant neoplasm of renal pelvis: Secondary | ICD-10-CM | POA: Insufficient documentation

## 2020-08-12 DIAGNOSIS — C641 Malignant neoplasm of right kidney, except renal pelvis: Secondary | ICD-10-CM | POA: Diagnosis present

## 2020-08-12 DIAGNOSIS — N433 Hydrocele, unspecified: Secondary | ICD-10-CM | POA: Diagnosis not present

## 2020-08-12 DIAGNOSIS — I252 Old myocardial infarction: Secondary | ICD-10-CM | POA: Insufficient documentation

## 2020-08-12 DIAGNOSIS — N411 Chronic prostatitis: Secondary | ICD-10-CM | POA: Diagnosis not present

## 2020-08-12 DIAGNOSIS — E7800 Pure hypercholesterolemia, unspecified: Secondary | ICD-10-CM | POA: Diagnosis not present

## 2020-08-12 DIAGNOSIS — Z8554 Personal history of malignant neoplasm of ureter: Secondary | ICD-10-CM | POA: Insufficient documentation

## 2020-08-12 DIAGNOSIS — R351 Nocturia: Secondary | ICD-10-CM | POA: Insufficient documentation

## 2020-08-12 HISTORY — PX: CYSTOSCOPY WITH BIOPSY: SHX5122

## 2020-08-12 HISTORY — PX: PROSTATE BIOPSY: SHX241

## 2020-08-12 LAB — GLUCOSE, CAPILLARY: Glucose-Capillary: 99 mg/dL (ref 70–99)

## 2020-08-12 SURGERY — CYSTOSCOPY, WITH BIOPSY
Anesthesia: General

## 2020-08-12 MED ORDER — SODIUM CHLORIDE 0.9 % IV SOLN
INTRAVENOUS | Status: AC
  Filled 2020-08-12: qty 20

## 2020-08-12 MED ORDER — DEXAMETHASONE SODIUM PHOSPHATE 10 MG/ML IJ SOLN
INTRAMUSCULAR | Status: AC
  Filled 2020-08-12: qty 1

## 2020-08-12 MED ORDER — DEXAMETHASONE SODIUM PHOSPHATE 10 MG/ML IJ SOLN
INTRAMUSCULAR | Status: DC | PRN
  Administered 2020-08-12: 8 mg via INTRAVENOUS

## 2020-08-12 MED ORDER — SUCCINYLCHOLINE CHLORIDE 20 MG/ML IJ SOLN
INTRAMUSCULAR | Status: DC | PRN
  Administered 2020-08-12: 180 mg via INTRAVENOUS

## 2020-08-12 MED ORDER — SODIUM CHLORIDE 0.9 % IR SOLN
Status: DC | PRN
  Administered 2020-08-12: 9000 mL

## 2020-08-12 MED ORDER — MIDAZOLAM HCL 2 MG/2ML IJ SOLN
INTRAMUSCULAR | Status: AC
  Filled 2020-08-12: qty 2

## 2020-08-12 MED ORDER — SUGAMMADEX SODIUM 200 MG/2ML IV SOLN
INTRAVENOUS | Status: DC | PRN
  Administered 2020-08-12: 180 mg via INTRAVENOUS

## 2020-08-12 MED ORDER — ORAL CARE MOUTH RINSE: 15.0000 mL | Freq: Once | OROMUCOSAL | Status: AC

## 2020-08-12 MED ORDER — ESMOLOL HCL 100 MG/10ML IV SOLN
INTRAVENOUS | Status: AC
  Filled 2020-08-12: qty 10

## 2020-08-12 MED ORDER — PROPOFOL 10 MG/ML IV BOLUS
INTRAVENOUS | Status: DC | PRN
  Administered 2020-08-12: 150 mg via INTRAVENOUS

## 2020-08-12 MED ORDER — FENTANYL CITRATE (PF) 100 MCG/2ML IJ SOLN
INTRAMUSCULAR | Status: DC | PRN
Start: 2020-08-12 — End: 2020-08-12
  Administered 2020-08-12: 50 ug via INTRAVENOUS
  Administered 2020-08-12 (×2): 25 ug via INTRAVENOUS

## 2020-08-12 MED ORDER — PHENYLEPHRINE HCL (PRESSORS) 10 MG/ML IV SOLN
INTRAVENOUS | Status: AC
  Filled 2020-08-12: qty 2

## 2020-08-12 MED ORDER — ESMOLOL HCL 100 MG/10ML IV SOLN
INTRAVENOUS | Status: DC | PRN
  Administered 2020-08-12: 20 mg via INTRAVENOUS

## 2020-08-12 MED ORDER — SODIUM CHLORIDE 0.9 % IV SOLN
2.0000 g | Freq: Once | INTRAVENOUS | Status: AC
  Administered 2020-08-12: 2 g via INTRAVENOUS

## 2020-08-12 MED ORDER — ONDANSETRON HCL 4 MG/2ML IJ SOLN
INTRAMUSCULAR | Status: AC
  Filled 2020-08-12: qty 2

## 2020-08-12 MED ORDER — ROCURONIUM BROMIDE 10 MG/ML (PF) SYRINGE
PREFILLED_SYRINGE | INTRAVENOUS | Status: AC
  Filled 2020-08-12: qty 10

## 2020-08-12 MED ORDER — FENTANYL CITRATE (PF) 100 MCG/2ML IJ SOLN
INTRAMUSCULAR | Status: AC
  Administered 2020-08-12: 50 ug via INTRAVENOUS
  Filled 2020-08-12: qty 2

## 2020-08-12 MED ORDER — PHENAZOPYRIDINE HCL 200 MG PO TABS: 200.0000 mg | ORAL_TABLET | Freq: Three times a day (TID) | ORAL | 0 refills | Status: DC | PRN

## 2020-08-12 MED ORDER — FENTANYL CITRATE (PF) 100 MCG/2ML IJ SOLN
INTRAMUSCULAR | Status: AC
  Filled 2020-08-12: qty 2

## 2020-08-12 MED ORDER — FENTANYL CITRATE (PF) 100 MCG/2ML IJ SOLN: 25.0000 ug | INTRAMUSCULAR | Status: DC | PRN

## 2020-08-12 MED ORDER — MIDAZOLAM HCL 5 MG/5ML IJ SOLN
INTRAMUSCULAR | Status: DC | PRN
  Administered 2020-08-12: 1 mg via INTRAVENOUS

## 2020-08-12 MED ORDER — PROPOFOL 10 MG/ML IV BOLUS
INTRAVENOUS | Status: AC
  Filled 2020-08-12: qty 20

## 2020-08-12 MED ORDER — EPHEDRINE SULFATE-NACL 50-0.9 MG/10ML-% IV SOSY
PREFILLED_SYRINGE | INTRAVENOUS | Status: DC | PRN
  Administered 2020-08-12: 10 mg via INTRAVENOUS

## 2020-08-12 MED ORDER — FLEET ENEMA 7-19 GM/118ML RE ENEM: 1.0000 | ENEMA | Freq: Once | RECTAL | Status: DC

## 2020-08-12 MED ORDER — LIDOCAINE 2% (20 MG/ML) 5 ML SYRINGE
INTRAMUSCULAR | Status: DC | PRN
  Administered 2020-08-12: 100 mg via INTRAVENOUS

## 2020-08-12 MED ORDER — ONDANSETRON HCL 4 MG/2ML IJ SOLN
INTRAMUSCULAR | Status: DC | PRN
  Administered 2020-08-12: 4 mg via INTRAVENOUS

## 2020-08-12 MED ORDER — CHLORHEXIDINE GLUCONATE 0.12 % MT SOLN
15.0000 mL | Freq: Once | OROMUCOSAL | Status: AC
  Administered 2020-08-12: 15 mL via OROMUCOSAL

## 2020-08-12 MED ORDER — PROMETHAZINE HCL 25 MG/ML IJ SOLN: 6.2500 mg | INTRAMUSCULAR | Status: DC | PRN

## 2020-08-12 MED ORDER — LACTATED RINGERS IV SOLN: INTRAVENOUS | Status: DC

## 2020-08-12 MED ORDER — ROCURONIUM BROMIDE 100 MG/10ML IV SOLN
INTRAVENOUS | Status: DC | PRN
  Administered 2020-08-12: 30 mg via INTRAVENOUS

## 2020-08-12 SURGICAL SUPPLY — 21 items
BAG URINE DRAIN 2000ML AR STRL (UROLOGICAL SUPPLIES) IMPLANT
BAG URO CATCHER STRL LF (MISCELLANEOUS) ×2 IMPLANT
CATH FOLEY 2WAY SLVR  5CC 18FR (CATHETERS) ×2
CATH FOLEY 2WAY SLVR 5CC 18FR (CATHETERS) ×1 IMPLANT
ELECT REM PT RETURN 15FT ADLT (MISCELLANEOUS) ×2 IMPLANT
GLOVE BIOGEL M STRL SZ7.5 (GLOVE) ×2 IMPLANT
GOWN STRL REUS W/TWL LRG LVL3 (GOWN DISPOSABLE) ×2 IMPLANT
INST BIOPSY MAXCORE 18GX25 (NEEDLE) IMPLANT
INSTR BIOPSY MAXCORE 18GX20 (NEEDLE) ×2 IMPLANT
KIT TURNOVER KIT A (KITS) IMPLANT
LOOP CUT BIPOLAR 24F LRG (ELECTROSURGICAL) ×2 IMPLANT
MANIFOLD NEPTUNE II (INSTRUMENTS) ×2 IMPLANT
NEEDLE SPNL 22GX7 QUINCKE BK (NEEDLE) ×4 IMPLANT
PACK CYSTO (CUSTOM PROCEDURE TRAY) ×2 IMPLANT
PENCIL SMOKE EVACUATOR (MISCELLANEOUS) IMPLANT
SYR CONTROL 10ML LL (SYRINGE) IMPLANT
SYR TOOMEY IRRIG 70ML (MISCELLANEOUS) ×2
SYRINGE TOOMEY IRRIG 70ML (MISCELLANEOUS) ×1 IMPLANT
TUBING CONNECTING 10 (TUBING) ×2 IMPLANT
TUBING UROLOGY SET (TUBING) ×2 IMPLANT
UNDERPAD 30X36 HEAVY ABSORB (UNDERPADS AND DIAPERS) ×2 IMPLANT

## 2020-08-12 NOTE — Anesthesia Postprocedure Evaluation (Signed)
Anesthesia Post Note  Patient: [name]  Procedure(s) Performed: CYSTOSCOPY WITH BLADDER BIOPSY AND TRANSURETHRAL RESECTION OF BLADDER TUMOR (N/A ) BIOPSY TRANSRECTAL ULTRASONIC PROSTATE (TUBP) (N/A )     Patient location during evaluation: PACU Anesthesia Type: General Level of consciousness: awake and alert Pain management: pain level controlled Vital Signs Assessment: post-procedure vital signs reviewed and stable Respiratory status: spontaneous breathing, nonlabored ventilation, respiratory function stable and patient connected to nasal cannula oxygen Cardiovascular status: blood pressure returned to baseline and stable Postop Assessment: no apparent nausea or vomiting Anesthetic complications: no   No complications documented.  Last Vitals:  Vitals:   08/12/20 0915 08/12/20 0950  BP: (!) 156/101 (!) 148/92  Pulse: (!) 45 67  Resp: 19 16  Temp:    SpO2: 98% 100%    Last Pain:  Vitals:   08/12/20 0950  TempSrc:   PainSc: 0-No pain                 Merlinda Frederick

## 2020-08-12 NOTE — Anesthesia Procedure Notes (Signed)

## 2020-08-12 NOTE — Transfer of Care (Signed)
Immediate Anesthesia Transfer of Care Note  Patient: Adam Harris  Procedure(s) Performed: CYSTOSCOPY WITH BLADDER BIOPSY AND TRANSURETHRAL RESECTION OF BLADDER TUMOR (N/A ) BIOPSY TRANSRECTAL ULTRASONIC PROSTATE (TUBP) (N/A )  Patient Location: PACU  Anesthesia Type:General  Level of Consciousness: awake, alert , oriented and patient cooperative  Airway & Oxygen Therapy: Patient Spontanous Breathing and Patient connected to face mask oxygen  Post-op Assessment: Report given to RN, Post -op Vital signs reviewed and stable and Patient moving all extremities  Post vital signs: Reviewed and stable  Last Vitals:  Vitals Value Taken Time  BP 148/107 08/12/20 0841  Temp    Pulse 64 08/12/20 0843  Resp 13 08/12/20 0843  SpO2 99 % 08/12/20 0843  Vitals shown include unvalidated device data.  Last Pain:  Vitals:   08/12/20 0621  TempSrc:   PainSc: 0-No pain         Complications: No complications documented.

## 2020-08-12 NOTE — Op Note (Signed)
Preoperative diagnosis: 1.  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  Low risk prostate cancer  Postoperative diagnosis: 1.  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  Low risk prostate cancer  Procedures: 1.  Transrectal ultrasound-guided prostate needle biopsy 2.  Cystoscopy 3.  Urethral dilation 4.  Transurethral resection of bladder tumor ( 3.5 cm)  Surgeon: [name] MD  Anesthesia: General  Complications: None  Specimens: 1.  Right lateral base prostate biopsy 2.  Right parasagittal base prostate biopsy 3.  Right lateral mid prostate biopsy 4.  Right parasagittal mid prostate biopsy 5.  Right lateral apex prostate biopsy 6.  Right parasagittal apex prostate biopsy 7.  Left lateral base prostate biopsy 8.  Left parasagittal base prostate biopsy 9.  Left lateral mid prostate biopsy 10.  Left parasagittal mid prostate biopsy 11.  Left lateral apex prostate biopsy 12.  Left parasagittal apex prostate biopsy 13.  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  He recently presented for surveillance cystoscopy follow-up and was noted to have a diffuse erythematous area of the bladder along the trigone extending up the right lateral wall concerning for possible carcinoma in situ.  Cytology demonstrated atypical cells.  He was recommended to undergo further evaluation with biopsy/resection.  In addition, he has a history of low risk prostate cancer and has been on active surveillance management.  He has not undergone a prostate biopsy in 3 years and was recommended to proceed with a prostate biopsy for further evaluation.   The potential risks, complications, and the expected recovery process associated with these procedures were discussed in detail and informed consent was obtained.  Description of procedure: The patient was taken the operating room and a general anesthetic was administered.  He was given preoperative antibiotics, placed in the left lateral decubitus position, and prepped in the usual standard fashion.  It was confirmed preoperatively that he had stopped his anticoagulation over 48 hours before his procedure.  A preoperative timeout was performed.  The transrectal ultrasound was then inserted into the rectum and the prostate was carefully examined in both transverse and sagittal views.  Prostate volume was measured at 53 cc.  There was some heterogeneity to the prostate without specific hypoechoic lesions or other concerning findings.  No median lobe was identified.  Under ultrasound guidance, needle biopsies were then obtained from the right lateral and parasagittal regions of the base, mid, and apical prostate as described above.  Additional needle biopsies were then obtained from the corresponding left regions.  A total of 12 biopsies were obtained and all needle biopsies were obtained under ultrasound guidance.  A digital rectal exam was then performed.  Minimal bleeding was noted and no prostate abnormalities were otherwise identified on exam.  The patient was then repositioned in the dorsolithotomy position and another preoperative timeout was performed.  Cystourethroscopy was performed with a 22 French cystoscope sheath.  The bladder was carefully examined with a 30 and 70 degree lens.  The left ureteral orifice was noted in its normal position and effluxing clear urine.  The right ureteral orifice was surgically absent.  There was diffuse erythematous mucosal changes extending  from the left trigone across the middle of the bladder toward the right lateral wall and extending to the bladder neck.  This  accounted for an approximately 3.5 to 4 cm diameter area.  No distinct bladder tumors were identified.  No other abnormalities were seen.  After an attempt to place the 26 French resectoscope sheath, it was necessary to use [employer] sounds to serially dilate the patient's urethra to 30 [place].  The resectoscope was unable to be placed with a visual obturator.  Using loop resection, a large portion of the aforementioned abnormal mucosa was then carefully resected and served as the bladder tumor specimen.  Additional erythematous areas were carefully cauterized and hemostasis was achieved.  The bladder was emptied and reinspected.  There was no active bleeding.  However, the patient's prostate was noted to be enlarged and was somewhat friable.  Considering the patient's need to resume anticoagulation in the near future, a 16 [place] Foley catheter was placed and will be left in place for approximately 2 days.  He tolerated the procedure well without complications.  He was able to be awakened and transferred to recovery unit in satisfactory condition.

## 2020-08-13 ENCOUNTER — Encounter (HOSPITAL_COMMUNITY): Payer: Self-pay | Admitting: Urology

## 2020-08-13 DIAGNOSIS — C61 Malignant neoplasm of prostate: Secondary | ICD-10-CM | POA: Diagnosis not present

## 2020-08-13 LAB — SURGICAL PATHOLOGY

## 2020-08-24 DIAGNOSIS — C678 Malignant neoplasm of overlapping sites of bladder: Secondary | ICD-10-CM | POA: Diagnosis not present

## 2020-08-24 DIAGNOSIS — C651 Malignant neoplasm of right renal pelvis: Secondary | ICD-10-CM | POA: Diagnosis not present

## 2020-08-24 DIAGNOSIS — C61 Malignant neoplasm of prostate: Secondary | ICD-10-CM | POA: Diagnosis not present

## 2020-09-01 ENCOUNTER — Telehealth: Payer: Self-pay | Admitting: Family Medicine

## 2020-09-01 NOTE — Telephone Encounter (Signed)
Left message for patient to schedule Annual Wellness Visit.  Please schedule with Nurse Health Advisor Martha Stanley, RN at [facility]  °

## 2020-09-01 NOTE — Telephone Encounter (Signed)
Left message for patient to schedule Annual Wellness Visit.  Please schedule with Nurse Health Advisor Martha Stanley, RN at [facility]  °

## 2020-09-02 DIAGNOSIS — C678 Malignant neoplasm of overlapping sites of bladder: Secondary | ICD-10-CM | POA: Diagnosis not present

## 2020-09-02 DIAGNOSIS — Z5111 Encounter for antineoplastic chemotherapy: Secondary | ICD-10-CM | POA: Diagnosis not present

## 2020-09-08 DIAGNOSIS — Z905 Acquired absence of kidney: Secondary | ICD-10-CM | POA: Diagnosis not present

## 2020-09-08 DIAGNOSIS — I129 Hypertensive chronic kidney disease with stage 1 through stage 4 chronic kidney disease, or unspecified chronic kidney disease: Secondary | ICD-10-CM | POA: Diagnosis not present

## 2020-09-08 DIAGNOSIS — E1122 Type 2 diabetes mellitus with diabetic chronic kidney disease: Secondary | ICD-10-CM | POA: Diagnosis not present

## 2020-09-08 DIAGNOSIS — R82998 Other abnormal findings in urine: Secondary | ICD-10-CM | POA: Diagnosis not present

## 2020-09-08 DIAGNOSIS — N184 Chronic kidney disease, stage 4 (severe): Secondary | ICD-10-CM | POA: Diagnosis not present

## 2020-09-09 DIAGNOSIS — N3000 Acute cystitis without hematuria: Secondary | ICD-10-CM | POA: Diagnosis not present

## 2020-09-09 DIAGNOSIS — C678 Malignant neoplasm of overlapping sites of bladder: Secondary | ICD-10-CM | POA: Diagnosis not present

## 2020-09-16 DIAGNOSIS — Z5111 Encounter for antineoplastic chemotherapy: Secondary | ICD-10-CM | POA: Diagnosis not present

## 2020-09-16 DIAGNOSIS — C678 Malignant neoplasm of overlapping sites of bladder: Secondary | ICD-10-CM | POA: Diagnosis not present

## 2020-09-23 DIAGNOSIS — C678 Malignant neoplasm of overlapping sites of bladder: Secondary | ICD-10-CM | POA: Diagnosis not present

## 2020-09-23 DIAGNOSIS — Z5111 Encounter for antineoplastic chemotherapy: Secondary | ICD-10-CM | POA: Diagnosis not present

## 2020-09-24 ENCOUNTER — Telehealth: Payer: Self-pay

## 2020-09-24 NOTE — Progress Notes (Signed)
Chronic Care Management Pharmacy Assistant   Name: Adam Harris  MRN: [id number] DOB: 02/28/1946  Reason for [place] Call.  PCP : Ronnald Nian, DO  Allergies:   Allergies  Allergen Reactions  . Xarelto [Rivaroxaban] Other (See Comments)    Skin Blisters     Medications: Outpatient Encounter Medications as of 09/24/2020  Medication Sig  . diphenhydramine-acetaminophen (TYLENOL PM) 25-500 MG TABS tablet Take 1 tablet by mouth at bedtime as needed (sleep).   . furosemide (LASIX) 20 MG tablet TAKE ONE TABLET BY MOUTH DAILY . MAY TAKE EXTRA TABLET IF NEEDED FOR SWELLING (Patient taking differently: Take 20 mg by mouth daily as needed for edema. )  . isosorbide mononitrate (IMDUR) 30 MG 24 hr tablet Take 1 tablet (30 mg total) by mouth daily.  . metoprolol tartrate (LOPRESSOR) 25 MG tablet TAKE ONE TABLET BY MOUTH TWICE A DAY (Patient taking differently: Take 25 mg by mouth 2 (two) times daily. )  . nitroGLYCERIN (NITROSTAT) 0.4 MG SL tablet PLACE ONE TABLET UNDER THE TONGUE EVERY 5 MINUTES TIMES THREE DOSES AS NEEDED FOR CHEST PAIN. CALL 911 IF 2ND DOSE DOES NOT HELP (Patient taking differently: Place 0.4 mg under the tongue every 5 (five) minutes as needed for chest pain. CALL 911 IF 2ND DOSE DOES NOT HELP)  . Olmesartan-amLODIPine-HCTZ 40-10-12.5 MG TABS TAKE HALF TABLET BY MOUTH DAILY (Patient taking differently: Take 0.5 tablets by mouth daily. )  . phenazopyridine (PYRIDIUM) 200 MG tablet Take 1 tablet (200 mg total) by mouth 3 (three) times daily as needed for pain.  . rosuvastatin (CRESTOR) 40 MG tablet Take 1 tablet (40 mg total) by mouth every morning. (Patient taking differently: Take 40 mg by mouth daily. )  . sitaGLIPtin (JANUVIA) 100 MG tablet Take 1 tablet (100 mg total) by mouth daily.   Facility-Administered Encounter Medications as of 09/24/2020  Medication  . sodium phosphate (FLEET) 7-19 GM/118ML enema 1 enema    Current  Diagnosis: Patient Active Problem List   Diagnosis Date Noted  . Urothelial carcinoma ([place]) 03/22/2020  . Educated about COVID-19 virus infection 10/28/2019  . Shingles 01/10/2018  . Chronic diastolic CHF (congestive heart failure), NYHA class 2 ([place]) 04/02/2017  . Atrial fibrillation with RVR ([place]) 11/20/2016  . Degenerative arthritis of knee, bilateral 05/31/2016  . Low back pain 05/31/2016  . Routine general medical examination at a health care facility 12/25/2014  . Exertional angina ([place]) 10/29/2014  . Atrial fibrillation, new onset ([place]) 10/29/2014  . Chronic renal disease, stage III ([place]) 10/29/2014  . History of coronary artery stent placement 07/24/2014  . Coronary artery disease 03/09/2014  . Diabetes mellitus, type 2 ([place]) 02/17/2014  . Dyslipidemia 02/05/2014  . NSTEMI (non-ST elevated myocardial infarction) ([place]) 02/04/2014  . History of non-ST elevation myocardial infarction (NSTEMI) 02/03/2014  . Essential hypertension 02/03/2014  . Chest pain 02/03/2014  . Abnormal prostate specific antigen 04/19/2013  . Anxiety state 04/19/2013  . Increased frequency of urination 04/19/2013  . Malignant neoplasm of prostate ([place]) 04/19/2013  . Insomnia 04/19/2013  . Stiffness of joint, lower leg 04/19/2013  . Sciatica 04/19/2013  . Tension type headache 04/19/2013    Follow-Up:  Pharmacist Review   09/24/2020 Name: Adam Harris MRN: [id number] DOB: November 26, 1945 Adam Harris is a 74 y.o. year old male who is a primary care patient of Ronnald Nian, DO.  Comprehensive medication review performed; Spoke to patient regarding cholesterol  Lipid Panel    Component Value  Date/Time   CHOL 149 06/25/2019 1621   TRIG 91 06/25/2019 1621   HDL 43 06/25/2019 1621   LDLCALC 88 06/25/2019 1621    10-year ASCVD risk score: The ASCVD Risk score Mikey Bussing DC Jr., et al., 2013) failed to calculate for the following reasons:   The patient has a prior MI or stroke diagnosis  . Current  antihyperlipidemic regimen:  ? Rosuvastatin 40 mg daily   Patient wife states patient has forgotten to take his rosuvastatin as instructed.   . Previous antihyperlipidemic medications tried: None ID . ASCVD risk enhancing conditions: DM, HTN and CHF . What recent interventions/DTPs have been made by any provider to improve Cholesterol control since last CPP Visit: None ID . Any recent hospitalizations or ED visits since last visit with CPP? Yes  o 08/12/20  Hospitalization . What diet changes have been made to improve Cholesterol?  o None ID . What exercise is being done to improve Cholesterol?  o None ID    Patient wife states patient had one of his kidneys remove due to cancer and now he has cancer in his bladder.  Patient wife states they decide to stay with their  current pharmacy instead of switching to [facility].   Patient wife states she will discuss with her husband to schedule his next follow up appointment with the clinical Pharmacist.    Adherence Review: Does the patient have >5 day gap between last estimated fill dates? Yes   [facility] Pharmacist Assistant [phone]

## 2020-09-30 DIAGNOSIS — Z5111 Encounter for antineoplastic chemotherapy: Secondary | ICD-10-CM | POA: Diagnosis not present

## 2020-09-30 DIAGNOSIS — C678 Malignant neoplasm of overlapping sites of bladder: Secondary | ICD-10-CM | POA: Diagnosis not present

## 2020-10-11 DIAGNOSIS — Z5111 Encounter for antineoplastic chemotherapy: Secondary | ICD-10-CM | POA: Diagnosis not present

## 2020-10-11 DIAGNOSIS — C678 Malignant neoplasm of overlapping sites of bladder: Secondary | ICD-10-CM | POA: Diagnosis not present

## 2020-10-18 DIAGNOSIS — C678 Malignant neoplasm of overlapping sites of bladder: Secondary | ICD-10-CM | POA: Diagnosis not present

## 2020-10-18 DIAGNOSIS — Z5111 Encounter for antineoplastic chemotherapy: Secondary | ICD-10-CM | POA: Diagnosis not present

## 2020-10-21 ENCOUNTER — Telehealth: Payer: Self-pay

## 2020-10-21 NOTE — Progress Notes (Signed)
  I have attempted without success to contact this patient by phone three times to do his Hypertension  Disease State call. I left a Voice message for patient to return my call.  Bessie Kellihan,CPA Clinical Pharmacist Assistant [phone]   

## 2020-11-01 ENCOUNTER — Telehealth: Payer: Self-pay

## 2020-11-01 NOTE — Progress Notes (Signed)
    Chronic Care Management Pharmacy Assistant   Name: Adam Harris  MRN: [id number] DOB: 1945-12-27  Reason for Encounter: Medication Review. Patient assistance for Eliquis  PCP : Adam Nian, DO  Allergies:   Allergies  Allergen Reactions  . Xarelto [Rivaroxaban] Other (See Comments)    Skin Blisters     Medications: Outpatient Encounter Medications as of 11/01/2020  Medication Sig  . diphenhydramine-acetaminophen (TYLENOL PM) 25-500 MG TABS tablet Take 1 tablet by mouth at bedtime as needed (sleep).   . furosemide (LASIX) 20 MG tablet TAKE ONE TABLET BY MOUTH DAILY . MAY TAKE EXTRA TABLET IF NEEDED FOR SWELLING (Patient taking differently: Take 20 mg by mouth daily as needed for edema. )  . isosorbide mononitrate (IMDUR) 30 MG 24 hr tablet Take 1 tablet (30 mg total) by mouth daily.  . metoprolol tartrate (LOPRESSOR) 25 MG tablet TAKE ONE TABLET BY MOUTH TWICE A DAY (Patient taking differently: Take 25 mg by mouth 2 (two) times daily. )  . nitroGLYCERIN (NITROSTAT) 0.4 MG SL tablet PLACE ONE TABLET UNDER THE TONGUE EVERY 5 MINUTES TIMES THREE DOSES AS NEEDED FOR CHEST PAIN. CALL 911 IF 2ND DOSE DOES NOT HELP (Patient taking differently: Place 0.4 mg under the tongue every 5 (five) minutes as needed for chest pain. CALL 911 IF 2ND DOSE DOES NOT HELP)  . Olmesartan-amLODIPine-HCTZ 40-10-12.5 MG TABS TAKE HALF TABLET BY MOUTH DAILY (Patient taking differently: Take 0.5 tablets by mouth daily. )  . phenazopyridine (PYRIDIUM) 200 MG tablet Take 1 tablet (200 mg total) by mouth 3 (three) times daily as needed for pain.  . rosuvastatin (CRESTOR) 40 MG tablet Take 1 tablet (40 mg total) by mouth every morning. (Patient taking differently: Take 40 mg by mouth daily. )  . sitaGLIPtin (JANUVIA) 100 MG tablet Take 1 tablet (100 mg total) by mouth daily.   Facility-Administered Encounter Medications as of 11/01/2020  Medication  . sodium phosphate (FLEET) 7-19 GM/118ML enema 1 enema     Current Diagnosis: Patient Active Problem List   Diagnosis Date Noted  . Urothelial carcinoma ([place]) 03/22/2020  . Educated about COVID-19 virus infection 10/28/2019  . Shingles 01/10/2018  . Chronic diastolic CHF (congestive heart failure), NYHA class 2 ([place]) 04/02/2017  . Atrial fibrillation with RVR ([place]) 11/20/2016  . Degenerative arthritis of knee, bilateral 05/31/2016  . Low back pain 05/31/2016  . Routine general medical examination at a health care facility 12/25/2014  . Exertional angina ([place]) 10/29/2014  . Atrial fibrillation, new onset ([place]) 10/29/2014  . Chronic renal disease, stage III ([place]) 10/29/2014  . History of coronary artery stent placement 07/24/2014  . Coronary artery disease 03/09/2014  . Diabetes mellitus, type 2 ([place]) 02/17/2014  . Dyslipidemia 02/05/2014  . NSTEMI (non-ST elevated myocardial infarction) ([place]) 02/04/2014  . History of non-ST elevation myocardial infarction (NSTEMI) 02/03/2014  . Essential hypertension 02/03/2014  . Chest pain 02/03/2014  . Abnormal prostate specific antigen 04/19/2013  . Anxiety state 04/19/2013  . Increased frequency of urination 04/19/2013  . Malignant neoplasm of prostate ([place]) 04/19/2013  . Insomnia 04/19/2013  . Stiffness of joint, lower leg 04/19/2013  . Sciatica 04/19/2013  . Tension type headache 04/19/2013    Goals Addressed   None     Follow-Up:  Patient Assistance Coordination   [name] have not receive application for Eliquis.  [facility] Pharmacist Assistant [phone]

## 2020-11-16 ENCOUNTER — Ambulatory Visit: Payer: Medicare Other

## 2020-11-17 ENCOUNTER — Telehealth: Payer: Self-pay

## 2020-11-17 NOTE — Progress Notes (Signed)
Chronic Care Management Pharmacy Assistant   Name: Adam Harris  MRN: [id number] DOB: 06-Apr-1946  Reason for [place] Call.   PCP : Ronnald Nian, DO  Allergies:   Allergies  Allergen Reactions  . Xarelto [Rivaroxaban] Other (See Comments)    Skin Blisters     Medications: Outpatient Encounter Medications as of 11/17/2020  Medication Sig  . diphenhydramine-acetaminophen (TYLENOL PM) 25-500 MG TABS tablet Take 1 tablet by mouth at bedtime as needed (sleep).   . furosemide (LASIX) 20 MG tablet TAKE ONE TABLET BY MOUTH DAILY . MAY TAKE EXTRA TABLET IF NEEDED FOR SWELLING (Patient taking differently: Take 20 mg by mouth daily as needed for edema. )  . isosorbide mononitrate (IMDUR) 30 MG 24 hr tablet Take 1 tablet (30 mg total) by mouth daily.  . metoprolol tartrate (LOPRESSOR) 25 MG tablet TAKE ONE TABLET BY MOUTH TWICE A DAY (Patient taking differently: Take 25 mg by mouth 2 (two) times daily. )  . nitroGLYCERIN (NITROSTAT) 0.4 MG SL tablet PLACE ONE TABLET UNDER THE TONGUE EVERY 5 MINUTES TIMES THREE DOSES AS NEEDED FOR CHEST PAIN. CALL 911 IF 2ND DOSE DOES NOT HELP (Patient taking differently: Place 0.4 mg under the tongue every 5 (five) minutes as needed for chest pain. CALL 911 IF 2ND DOSE DOES NOT HELP)  . Olmesartan-amLODIPine-HCTZ 40-10-12.5 MG TABS TAKE HALF TABLET BY MOUTH DAILY (Patient taking differently: Take 0.5 tablets by mouth daily. )  . phenazopyridine (PYRIDIUM) 200 MG tablet Take 1 tablet (200 mg total) by mouth 3 (three) times daily as needed for pain.  . rosuvastatin (CRESTOR) 40 MG tablet Take 1 tablet (40 mg total) by mouth every morning. (Patient taking differently: Take 40 mg by mouth daily. )  . sitaGLIPtin (JANUVIA) 100 MG tablet Take 1 tablet (100 mg total) by mouth daily.   Facility-Administered Encounter Medications as of 11/17/2020  Medication  . sodium phosphate (FLEET) 7-19 GM/118ML enema 1 enema    Current  Diagnosis: Patient Active Problem List   Diagnosis Date Noted  . Urothelial carcinoma ([place]) 03/22/2020  . Educated about COVID-19 virus infection 10/28/2019  . Shingles 01/10/2018  . Chronic diastolic CHF (congestive heart failure), NYHA class 2 ([place]) 04/02/2017  . Atrial fibrillation with RVR ([place]) 11/20/2016  . Degenerative arthritis of knee, bilateral 05/31/2016  . Low back pain 05/31/2016  . Routine general medical examination at a health care facility 12/25/2014  . Exertional angina ([place]) 10/29/2014  . Atrial fibrillation, new onset ([place]) 10/29/2014  . Chronic renal disease, stage III ([place]) 10/29/2014  . History of coronary artery stent placement 07/24/2014  . Coronary artery disease 03/09/2014  . Diabetes mellitus, type 2 ([place]) 02/17/2014  . Dyslipidemia 02/05/2014  . NSTEMI (non-ST elevated myocardial infarction) ([place]) 02/04/2014  . History of non-ST elevation myocardial infarction (NSTEMI) 02/03/2014  . Essential hypertension 02/03/2014  . Chest pain 02/03/2014  . Abnormal prostate specific antigen 04/19/2013  . Anxiety state 04/19/2013  . Increased frequency of urination 04/19/2013  . Malignant neoplasm of prostate ([place]) 04/19/2013  . Insomnia 04/19/2013  . Stiffness of joint, lower leg 04/19/2013  . Sciatica 04/19/2013  . Tension type headache 04/19/2013    Goals Addressed   None     Reviewed chart prior to disease state call. Spoke with patient regarding BP  Recent Office Vitals: BP Readings from Last 3 Encounters:  08/12/20 (!) 148/92  08/05/20 (!) 142/99  05/26/20 110/82   Pulse Readings from Last 3 Encounters:  08/12/20 67  08/05/20 (!) 59  05/26/20 71    Wt Readings from Last 3 Encounters:  08/12/20 212 lb (96.2 kg)  08/05/20 212 lb (96.2 kg)  05/26/20 207 lb 12.8 oz (94.3 kg)     Kidney Function Lab Results  Component Value Date/Time   CREATININE 2.29 (H) 08/05/2020 08:26 AM   CREATININE 2.51 (H) 03/24/2020 04:58 AM   CREATININE 1.77 (H)  01/01/2017 08:10 AM   GFR 42.58 (L) 11/27/2019 03:56 PM   GFRNONAA 27 (L) 08/05/2020 08:26 AM   GFRAA 32 (L) 08/05/2020 08:26 AM    BMP Latest Ref Rng & Units 08/05/2020 03/24/2020 03/23/2020  Glucose 70 - 99 mg/dL 105(H) 129(H) 158(H)  BUN 8 - 23 mg/dL 45(H) 42(H) 39(H)  Creatinine 0.61 - 1.24 mg/dL 2.29(H) 2.51(H) 2.63(H)  BUN/Creat Ratio 10 - 24 - - -  Sodium 135 - 145 mmol/L 142 134(L) 135  Potassium 3.5 - 5.1 mmol/L 4.5 4.5 4.7  Chloride 98 - 111 mmol/L 108 104 103  CO2 22 - 32 mmol/L 26 24 26   Calcium 8.9 - 10.3 mg/dL 9.1 8.1(L) 8.5(L)    . Current antihypertensive regimen:   Furosemide 20 mg daily as needed  Isosorbide Mononitrate 30 mg daily   Metoprolol tartrate 25 mg twice daily   Olmesartan-Amlodipine-HCTZ 40-10-12.5 mg 1/2 tab daily . What recent interventions/DTPs have been made by any provider to improve Blood Pressure control since last CPP Visit: None ID . Any recent hospitalizations or ED visits since last visit with CPP? No  Adherence Review: Is the patient currently on ACE/ARB medication? Yes Does the patient have >5 day gap between last estimated fill dates? No  LVM 11/17/2020 ,11/23/2020,11/26/2020   I have attempted without success to contact this patient by phone three times to do his hypertension Disease State call. I left a Voice message for patient to return my call.  Maryjean Ka  Follow-Up:  Pharmacist Review

## 2020-11-26 ENCOUNTER — Other Ambulatory Visit: Payer: Self-pay

## 2020-11-30 ENCOUNTER — Ambulatory Visit: Payer: Medicare Other | Admitting: Family Medicine

## 2020-12-07 DIAGNOSIS — I129 Hypertensive chronic kidney disease with stage 1 through stage 4 chronic kidney disease, or unspecified chronic kidney disease: Secondary | ICD-10-CM | POA: Diagnosis not present

## 2020-12-07 DIAGNOSIS — R809 Proteinuria, unspecified: Secondary | ICD-10-CM | POA: Diagnosis not present

## 2020-12-07 DIAGNOSIS — N184 Chronic kidney disease, stage 4 (severe): Secondary | ICD-10-CM | POA: Diagnosis not present

## 2020-12-07 DIAGNOSIS — E1122 Type 2 diabetes mellitus with diabetic chronic kidney disease: Secondary | ICD-10-CM | POA: Diagnosis not present

## 2020-12-08 ENCOUNTER — Telehealth: Payer: Self-pay | Admitting: *Deleted

## 2020-12-08 NOTE — Telephone Encounter (Signed)
Prior authorization started on covermymeds for Eliquis

## 2020-12-09 NOTE — Telephone Encounter (Signed)
Per covermymeds  ? Your request has been approved Your PA request has been approved. Additional information will be provided in the approval communication. (Message 1145)  Advised patient, verbalized understanding

## 2020-12-15 MED ORDER — APIXABAN 5 MG PO TABS: 5.0000 mg | ORAL_TABLET | Freq: Two times a day (BID) | ORAL | 1 refills | Status: DC

## 2020-12-15 NOTE — Addendum Note (Signed)
Addended by: Duy Lemming E on: 12/15/2020 03:41 PM   Modules accepted: Orders

## 2020-12-15 NOTE — Telephone Encounter (Addendum)
Pt just needs $10 copay card activated. I have activated a copay card and sent it to his pharmacy, confirmed copay is now $10. ID [id number], [place], [place], GRP [id number].

## 2020-12-15 NOTE — Telephone Encounter (Signed)
**Note De-Identified Kaiyla Stahly Obfuscation** Letter received from [facility] stating that the pts Eliquis has been approved for coverage. Approval is valid until 12/08/2021  I have notified [employer] of this approval and they are saying that the pts ins BCBS is not paying on this RX at all and that they do not feel that the $500 cost to the pt is a deductible. I advised again that we have already received an approval letter from [place] for the pts Eliquis.  I am forwarding this phone note (and the approval letter [facility] e-mail)  to our pharmacy team for advisement as I am unsure what we need to do from here.

## 2020-12-16 ENCOUNTER — Other Ambulatory Visit: Payer: Self-pay

## 2020-12-17 ENCOUNTER — Encounter: Payer: Self-pay | Admitting: Family Medicine

## 2020-12-17 ENCOUNTER — Ambulatory Visit (INDEPENDENT_AMBULATORY_CARE_PROVIDER_SITE_OTHER): Payer: Medicare Other | Admitting: Family Medicine

## 2020-12-17 VITALS — BP 120/78 | HR 82 | Temp 97.9°F | Ht 71.0 in | Wt 216.6 lb

## 2020-12-17 DIAGNOSIS — I2510 Atherosclerotic heart disease of native coronary artery without angina pectoris: Secondary | ICD-10-CM

## 2020-12-17 DIAGNOSIS — Z23 Encounter for immunization: Secondary | ICD-10-CM

## 2020-12-17 DIAGNOSIS — C689 Malignant neoplasm of urinary organ, unspecified: Secondary | ICD-10-CM

## 2020-12-17 DIAGNOSIS — Z1211 Encounter for screening for malignant neoplasm of colon: Secondary | ICD-10-CM

## 2020-12-17 DIAGNOSIS — E1122 Type 2 diabetes mellitus with diabetic chronic kidney disease: Secondary | ICD-10-CM

## 2020-12-17 DIAGNOSIS — I4891 Unspecified atrial fibrillation: Secondary | ICD-10-CM

## 2020-12-17 DIAGNOSIS — N1831 Chronic kidney disease, stage 3a: Secondary | ICD-10-CM | POA: Diagnosis not present

## 2020-12-17 DIAGNOSIS — N183 Chronic kidney disease, stage 3 unspecified: Secondary | ICD-10-CM

## 2020-12-17 DIAGNOSIS — I10 Essential (primary) hypertension: Secondary | ICD-10-CM

## 2020-12-17 DIAGNOSIS — E785 Hyperlipidemia, unspecified: Secondary | ICD-10-CM | POA: Diagnosis not present

## 2020-12-17 LAB — LIPID PANEL
Cholesterol: 114 mg/dL (ref 0–200)
HDL: 39.3 mg/dL (ref >39.00)
LDL Cholesterol: 66 mg/dL (ref 0–99)
NonHDL: 74.96
Total CHOL/HDL Ratio: 3
Triglycerides: 43 mg/dL (ref 0.0–149.0)
VLDL: 8.6 mg/dL (ref 0.0–40.0)

## 2020-12-17 LAB — BASIC METABOLIC PANEL
BUN: 41 mg/dL — ABNORMAL HIGH (ref 6–23)
CO2: 25 mEq/L (ref 19–32)
Calcium: 9.2 mg/dL (ref 8.4–10.5)
Chloride: 106 mEq/L (ref 96–112)
Creatinine, Ser: 2.26 mg/dL — ABNORMAL HIGH (ref 0.40–1.50)
GFR: 27.95 mL/min — ABNORMAL LOW (ref >60.00)
Glucose, Bld: 106 mg/dL — ABNORMAL HIGH (ref 70–99)
Potassium: 4.7 mEq/L (ref 3.5–5.1)
Sodium: 138 mEq/L (ref 135–145)

## 2020-12-17 LAB — ALT: ALT: 11 U/L (ref 0–53)

## 2020-12-17 LAB — MICROALBUMIN / CREATININE URINE RATIO
Creatinine,U: 29.8 mg/dL
Microalb Creat Ratio: 9.9 mg/g (ref 0.0–30.0)
Microalb, Ur: 2.9 mg/dL — ABNORMAL HIGH (ref 0.0–1.9)

## 2020-12-17 LAB — AST: AST: 17 U/L (ref 0–37)

## 2020-12-17 LAB — HEMOGLOBIN A1C: Hgb A1c MFr Bld: 7 % — ABNORMAL HIGH (ref 4.6–6.5)

## 2020-12-17 MED ORDER — AMLODIPINE BESYLATE 5 MG PO TABS: 5.0000 mg | ORAL_TABLET | Freq: Every day | ORAL | 3 refills | Status: DC

## 2020-12-17 MED ORDER — ROSUVASTATIN CALCIUM 40 MG PO TABS: 40.0000 mg | ORAL_TABLET | ORAL | 3 refills | Status: DC

## 2020-12-17 MED ORDER — OLMESARTAN MEDOXOMIL 20 MG PO TABS: 20.0000 mg | ORAL_TABLET | Freq: Every day | ORAL | 3 refills | Status: DC

## 2020-12-17 MED ORDER — SITAGLIPTIN PHOSPHATE 100 MG PO TABS: 100.0000 mg | ORAL_TABLET | Freq: Every day | ORAL | 3 refills | Status: DC

## 2020-12-17 MED ORDER — FUROSEMIDE 20 MG PO TABS: 20.0000 mg | ORAL_TABLET | Freq: Every day | ORAL | 1 refills | Status: DC | PRN

## 2020-12-17 MED ORDER — ISOSORBIDE MONONITRATE ER 30 MG PO TB24: 30.0000 mg | ORAL_TABLET | Freq: Every day | ORAL | 3 refills | Status: DC

## 2020-12-17 NOTE — Patient Instructions (Signed)
shingrix (shingles vaccine)

## 2020-12-17 NOTE — Progress Notes (Signed)
Adam Harris is a 75 y.o. male  Chief Complaint  Patient presents with  . Annual Exam    F/u HTN and meds.  Wants flu shot today.      HPI: Adam Harris is a 75 y.o. male seen today for routine 64mo f/u on chronic medical issues and labs. Pts PMHx is significant for DM, HTN, HLD, bladder carcinoma, CKD, A-fib on anticoagulation, CAD. Pt would like flu vaccine today.  Pt follows with cardio Dr. Percival Spanish, urology Dr. [name], nephrology [facility]). He had labs done in 07/2020 - CBC, BMP, A1C.   Last Colonoscopy: flex sig > 10 years ago  Med refills needed today: needs meds changed to [employer]  Past Medical History:  Diagnosis Date  . Abnormal radiologic findings on diagnostic imaging of renal pelvis, ureter, or bladder    bilateral ureter abnormalities  . Anticoagulant long-term use    eliquis  . Anxiety   . Arthritis   . CAD (coronary artery disease) cardiologist-- dr hochrein   NSTEMI 02-04-2014  per cardiac cath chronic occluded RCA w/ faint left-to-right collaterals and aneurysmal LCFx with sluggish coronary flow/   NSTEMI --11-21-2016 per cardiac cath occluded proximal RCA & mid to diastal CFX 100%, med rx. If that does not work, PTCA or CABG  . Chronic diastolic heart failure ([place])   . CKD (chronic kidney disease), stage III [facility])    patient unaware  . DOE (dyspnea on exertion)    2-3 flights of stairs  . Dysrhythmia 2019   A-Fib  . Fatty liver    pt denies  . Hematuria   . Hematuria 02/2019  . History of COVID-19 10/2019  . History of non-ST elevation myocardial infarction (NSTEMI)    02-04-2014  and 11-21-2016  cardiac cath done both times ,  medically management  . History of shingles 12/2017   slight pain and numbness still noted in the area  . Hyperlipidemia   . Hypertension   . Insomnia   . Myocardial infarction ([place]) 2015  . Persistent atrial fibrillation [facility])    cardiologsit-- dr Percival Spanish  . Sciatica   . Thoracic aortic atherosclerosis  ([place])   . Type 2 diabetes mellitus ([place])   . Urinary frequency     Past Surgical History:  Procedure Laterality Date  . CARDIAC CATHETERIZATION N/A 11/21/2016   Procedure: Left Heart Cath and Coronary Angiography;  Surgeon: Troy Sine, MD;  Location: [place] CV LAB;  Service: Cardiovascular;  Laterality: N/A;  pRCA 100% , ostial LAD 45%, OM3 80%, mCFX to dCFX 100% (AV groove), lateral OM3 50%  . COLONOSCOPY    . CYSTOSCOPY WITH BIOPSY N/A 08/12/2020   Procedure: CYSTOSCOPY WITH BLADDER BIOPSY AND TRANSURETHRAL RESECTION OF BLADDER TUMOR;  Surgeon: [name], MD;  Location: [facility];  Service: Urology;  Laterality: N/A;  . CYSTOSCOPY WITH URETEROSCOPY AND STENT PLACEMENT Right 12/15/2019   Procedure: CYSTOSCOPY WITH RIGHT RETROGRADE/ RIGHT URETEROSCOPY/ BIOPSY;  Surgeon: [name], MD;  Location: [place];  Service: Urology;  Laterality: Right;  . CYSTOSCOPY/RETROGRADE/URETEROSCOPY Bilateral 03/18/2018   Procedure: CYSTOSCOPY/RETROGRADE/URETEROSCOPY.;  Surgeon: [name], MD;  Location: [facility];  Service: Urology;  Laterality: Bilateral;  . KIDNEY SURGERY    . KNEE ARTHROSCOPY Right   . LEFT HEART CATHETERIZATION WITH CORONARY ANGIOGRAM N/A 02/04/2014   Procedure: LEFT HEART CATHETERIZATION WITH CORONARY ANGIOGRAM;  Surgeon: [name], MD;  Location: [place] CATH LAB;  Service: Cardiovascular;  Laterality: N/A;  severe  one-vessel CAD, chronically occluded RCA with faint left-to-right collaterals;  aneurysmal LCFx with sluggish coronary flow;  normal LVSF w/ moderately elevated LVEDP (ostialOM2 20%, pOM3 20%, pD1 20%, mCFX 50%, diffuse 20% pCFX)  . PROSTATE BIOPSY N/A 08/12/2020   Procedure: BIOPSY TRANSRECTAL ULTRASONIC PROSTATE (TUBP);  Surgeon: [name], MD;  [facility];  Service: Urology;  Laterality: N/A;  . ROBOT ASSITED LAPAROSCOPIC NEPHROURETERECTOMY Right 03/22/2020   Procedure: XI ROBOT ASSITED LAPAROSCOPIC NEPHROURETERECTOMY;   Surgeon: [name], MD;  [facility];  Service: Urology;  Laterality: Right;  . TRANSTHORACIC ECHOCARDIOGRAM  11-22-2016   dr hochrein   ef 55-60%/ mild MR and TR/ moderate LAE    Social History   Socioeconomic History  . Marital status: Married    Spouse name: Not on file  . Number of children: Not on file  . Years of education: Not on file  . Highest education level: Not on file  Occupational History  . Not on file  Tobacco Use  . Smoking status: Never Smoker  . Smokeless tobacco: Former Systems developer    Types: Secondary school teacher  . Vaping Use: Never used  Substance and Sexual Activity  . Alcohol use: No  . Drug use: No  . Sexual activity: Yes  Other Topics Concern  . Not on file  Social History Narrative  . Not on file   Social Determinants of Health   Financial Resource Strain: High Risk  . Difficulty of Paying Living Expenses: Very hard  Food Insecurity: Not on file  Transportation Needs: No Transportation Needs  . Lack of Transportation (Medical): No  . Lack of Transportation (Non-Medical): No  Physical Activity: Not on file  Stress: Not on file  Social Connections: Not on file  Intimate Partner Violence: Not on file    Family History  Problem Relation Age of Onset  . Hypertension Mother      Immunization History  Administered Date(s) Administered  . DTaP 12/24/2014  . Fluad Quad(high Dose 65+) 09/04/2019  . Influenza, High Dose Seasonal PF 08/23/2018  . Influenza,inj,Quad PF,6+ Mos 07/27/2014  . Influenza-Unspecified 07/27/2014  . Pneumococcal Conjugate-13 05/22/2016  . Pneumococcal Polysaccharide-23 02/03/2014, 12/24/2014  . Tdap 02/03/2014, 12/24/2014, 07/07/2018    Outpatient Encounter Medications as of 12/17/2020  Medication Sig  . amLODipine (NORVASC) 5 MG tablet Take 5 mg by mouth daily.  Marland Kitchen apixaban (ELIQUIS) 5 MG TABS tablet Take 1 tablet (5 mg total) by mouth 2 (two) times daily.  . diphenhydramine-acetaminophen (TYLENOL PM) 25-500 MG TABS  tablet Take 1 tablet by mouth at bedtime as needed (sleep).   . furosemide (LASIX) 20 MG tablet TAKE ONE TABLET BY MOUTH DAILY . MAY TAKE EXTRA TABLET IF NEEDED FOR SWELLING (Patient taking differently: Take 20 mg by mouth daily as needed for edema.)  . isosorbide mononitrate (IMDUR) 30 MG 24 hr tablet Take 1 tablet (30 mg total) by mouth daily.  . metoprolol tartrate (LOPRESSOR) 25 MG tablet TAKE ONE TABLET BY MOUTH TWICE A DAY (Patient taking differently: Take 25 mg by mouth 2 (two) times daily.)  . nitroGLYCERIN (NITROSTAT) 0.4 MG SL tablet PLACE ONE TABLET UNDER THE TONGUE EVERY 5 MINUTES TIMES THREE DOSES AS NEEDED FOR CHEST PAIN. CALL 911 IF 2ND DOSE DOES NOT HELP (Patient taking differently: Place 0.4 mg under the tongue every 5 (five) minutes as needed for chest pain. CALL 911 IF 2ND DOSE DOES NOT HELP)  . olmesartan (BENICAR) 20 MG tablet Take 20 mg by mouth daily.  Marland Kitchen  rosuvastatin (CRESTOR) 40 MG tablet Take 1 tablet (40 mg total) by mouth every morning. (Patient taking differently: Take 40 mg by mouth daily.)  . sitaGLIPtin (JANUVIA) 100 MG tablet Take 1 tablet (100 mg total) by mouth daily.  . Olmesartan-amLODIPine-HCTZ 40-10-12.5 MG TABS TAKE HALF TABLET BY MOUTH DAILY (Patient not taking: Reported on 12/17/2020)  . phenazopyridine (PYRIDIUM) 200 MG tablet Take 1 tablet (200 mg total) by mouth 3 (three) times daily as needed for pain. (Patient not taking: Reported on 12/17/2020)   Facility-Administered Encounter Medications as of 12/17/2020  Medication  . sodium phosphate (FLEET) 7-19 GM/118ML enema 1 enema     ROS: Gen: no fever, chills  Skin: no rash, itching ENT: no ear pain, ear drainage, nasal congestion, rhinorrhea, sinus pressure, sore throat Eyes: no blurry vision, double vision Resp: no cough, wheeze,SOB CV: no CP, palpitations, LE edema,  GI: no heartburn, n/v/d/c, abd pain GU: follows with urology and nephrology MSK: no joint pain, myalgias, back pain Neuro: no dizziness,  headache, weakness, vertigo Psych: no depression, anxiety, insomnia   Allergies  Allergen Reactions  . Xarelto [Rivaroxaban] Other (See Comments)    Skin Blisters     BP 120/78   Pulse 82   Temp 97.9 F (36.6 C) (Temporal)   Ht 5\' 11"  (1.803 m)   Wt 216 lb 9.6 oz (98.2 kg)   SpO2 97%   BMI 30.21 kg/m    Wt Readings from Last 3 Encounters:  12/17/20 216 lb 9.6 oz (98.2 kg)  08/12/20 212 lb (96.2 kg)  08/05/20 212 lb (96.2 kg)   Temp Readings from Last 3 Encounters:  12/17/20 97.9 F (36.6 C) (Temporal)  08/12/20 97.7 F (36.5 C)  08/05/20 98.5 F (36.9 C) (Oral)   BP Readings from Last 3 Encounters:  12/17/20 120/78  08/12/20 (!) 148/92  08/05/20 (!) 142/99   Pulse Readings from Last 3 Encounters:  12/17/20 82  08/12/20 67  08/05/20 (!) 59    Physical Exam Constitutional:      General: He is not in acute distress.    Appearance: He is well-developed and well-nourished.  HENT:     Head: Normocephalic and atraumatic.     Right Ear: Tympanic membrane and ear canal normal.     Left Ear: Tympanic membrane and ear canal normal.     Nose: Nose normal.     Mouth/Throat:     Mouth: Oropharynx is clear and moist and mucous membranes are normal.  Neck:     Thyroid: No thyromegaly.  Cardiovascular:     Rate and Rhythm: Normal rate and regular rhythm.     Pulses: Intact distal pulses.     Heart sounds: Normal heart sounds.  Pulmonary:     Effort: Pulmonary effort is normal.     Breath sounds: Normal breath sounds. No wheezing or rhonchi.  Abdominal:     General: Bowel sounds are normal. There is no distension.     Palpations: Abdomen is soft. There is no mass.     Tenderness: There is no abdominal tenderness. There is no guarding or rebound.  Musculoskeletal:        General: No edema.     Cervical back: Neck supple.     Right lower leg: No edema.     Left lower leg: No edema.  Lymphadenopathy:     Cervical: No cervical adenopathy.  Skin:    General: Skin  is warm and dry.  Neurological:     Mental Status:  He is alert and oriented to person, place, and time.     Motor: No abnormal muscle tone.     Coordination: Coordination normal.  Psychiatric:        Mood and Affect: Mood and affect normal.     Lab Results  Component Value Date   NA 142 08/05/2020   K 4.5 08/05/2020   CREATININE 2.29 (H) 08/05/2020   GFRNONAA 27 (L) 08/05/2020   GFRAA 32 (L) 08/05/2020   GLUCOSE 105 (H) 08/05/2020   Lab Results  Component Value Date   HGBA1C 6.6 (H) 08/05/2020   Lab Results  Component Value Date   WBC 7.7 08/05/2020   HGB 15.2 08/05/2020   HCT 47.1 08/05/2020   MCV 90.4 08/05/2020   PLT 159 08/05/2020   Lab Results  Component Value Date   MICROALBUR 33.1 (H) 11/28/2019   MICROALBUR 1.0 05/22/2016   Lab Results  Component Value Date   CHOL 149 06/25/2019   HDL 43 06/25/2019   LDLCALC 88 06/25/2019   TRIG 91 06/25/2019   CHOLHDL 3.5 06/25/2019   Lab Results  Component Value Date   ALT 9 (A) 01/07/2020   AST 14 01/07/2020   ALKPHOS 57 11/22/2016   BILITOT 1.3 (H) 11/22/2016    A/P:  1. Type 2 diabetes mellitus with stage 3a chronic kidney disease, without long-term current use of insulin (HCC) - controlled - Hemoglobin A1c - Microalbumin / creatinine urine ratio Refill: - sitaGLIPtin (JANUVIA) 100 MG tablet; Take 1 tablet (100 mg total) by mouth daily.  Dispense: 90 tablet; Refill: 3 - f/u in 6 mo or sooner PRN  2. Dyslipidemia - Lipid panel - ALT - AST Refill: - rosuvastatin (CRESTOR) 40 MG tablet; Take 1 tablet (40 mg total) by mouth every morning.  Dispense: 90 tablet; Refill: 3  3. Essential hypertension - controlled, at goal Refill: - Basic metabolic panel - amLODipine (NORVASC) 5 MG tablet; Take 1 tablet (5 mg total) by mouth daily.  Dispense: 90 tablet; Refill: 3 - isosorbide mononitrate (IMDUR) 30 MG 24 hr tablet; Take 1 tablet (30 mg total) by mouth daily.  Dispense: 90 tablet; Refill: 3 - olmesartan  (BENICAR) 20 MG tablet; Take 1 tablet (20 mg total) by mouth daily.  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 [name] ([facility])  5. Stage 3 chronic kidney disease, unspecified whether stage 3a or 3b CKD ([place]) - follows with [facility] - Basic metabolic panel  6. Coronary artery disease involving native coronary artery of native heart without angina pectoris 7. Atrial fibrillation with RVR (HCC) - follows with cardio Dr. Percival Spanish - cont eliquis  8. Screening for colon cancer - Ambulatory referral to Gastroenterology   This visit occurred during the SARS-CoV-2 public health emergency.  Safety protocols were in place, including screening questions prior to the visit, additional usage of staff PPE, and extensive cleaning of exam room while observing appropriate contact time as indicated for disinfecting solutions.

## 2020-12-20 ENCOUNTER — Telehealth: Payer: Self-pay

## 2020-12-20 NOTE — Progress Notes (Signed)
Chronic Care Management Pharmacy Assistant   Name: Adam Harris  MRN: [id number] DOB: 10/12/1946  Reason for [place] Call.  Patient Questions:  1.  Have you seen any other providers since your last visit? Yes, 12/17/2020 PCP Letta Median  2.  Any changes in your medicines or health? No     PCP : Ronnald Nian, DO  Allergies:   Allergies  Allergen Reactions  . Xarelto [Rivaroxaban] Other (See Comments)    Skin Blisters     Medications: Outpatient Encounter Medications as of 12/20/2020  Medication Sig  . amLODipine (NORVASC) 5 MG tablet Take 1 tablet (5 mg total) by mouth daily.  Marland Kitchen apixaban (ELIQUIS) 5 MG TABS tablet Take 1 tablet (5 mg total) by mouth 2 (two) times daily.  . diphenhydramine-acetaminophen (TYLENOL PM) 25-500 MG TABS tablet Take 1 tablet by mouth at bedtime as needed (sleep).   . furosemide (LASIX) 20 MG tablet Take 1 tablet (20 mg total) by mouth daily as needed for edema.  . isosorbide mononitrate (IMDUR) 30 MG 24 hr tablet Take 1 tablet (30 mg total) by mouth daily.  . metoprolol tartrate (LOPRESSOR) 25 MG tablet TAKE ONE TABLET BY MOUTH TWICE A DAY (Patient taking differently: Take 25 mg by mouth 2 (two) times daily.)  . nitroGLYCERIN (NITROSTAT) 0.4 MG SL tablet PLACE ONE TABLET UNDER THE TONGUE EVERY 5 MINUTES TIMES THREE DOSES AS NEEDED FOR CHEST PAIN. CALL 911 IF 2ND DOSE DOES NOT HELP (Patient taking differently: Place 0.4 mg under the tongue every 5 (five) minutes as needed for chest pain. CALL 911 IF 2ND DOSE DOES NOT HELP)  . olmesartan (BENICAR) 20 MG tablet Take 1 tablet (20 mg total) by mouth daily.  . rosuvastatin (CRESTOR) 40 MG tablet Take 1 tablet (40 mg total) by mouth every morning.  . sitaGLIPtin (JANUVIA) 100 MG tablet Take 1 tablet (100 mg total) by mouth daily.   Facility-Administered Encounter Medications as of 12/20/2020  Medication  . sodium phosphate (FLEET) 7-19 GM/118ML enema 1 enema    Current  Diagnosis: Patient Active Problem List   Diagnosis Date Noted  . 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 Educated about COVID-19 virus infection 10/28/2019  . Shingles 01/10/2018  . Chronic diastolic CHF (congestive heart failure), NYHA class 2 ([place]) 04/02/2017  . Atrial fibrillation with RVR ([place]) 11/20/2016  . Degenerative arthritis of knee, bilateral 05/31/2016  . Low back pain 05/31/2016  . Routine general medical examination at a health care facility 12/25/2014  . Exertional angina ([place]) 10/29/2014  . Atrial fibrillation, new onset ([place]) 10/29/2014  . Chronic renal disease, stage III ([place]) 10/29/2014  . History of coronary artery stent placement 07/24/2014  . Coronary artery disease 03/09/2014  . Diabetes mellitus, type 2 ([place]) 02/17/2014  . Dyslipidemia 02/05/2014  . NSTEMI (non-ST elevated myocardial infarction) ([place]) 02/04/2014  . History of non-ST elevation myocardial infarction (NSTEMI) 02/03/2014  . Essential hypertension 02/03/2014  . Chest pain 02/03/2014  . Abnormal prostate specific antigen 04/19/2013  . Anxiety state 04/19/2013  . Increased frequency of urination 04/19/2013  . Malignant neoplasm of prostate ([place]) 04/19/2013  . Insomnia 04/19/2013  . Stiffness of joint, lower leg 04/19/2013  . Sciatica 04/19/2013  . Tension type headache 04/19/2013    Goals Addressed   None    Reviewed chart prior to disease state call. Spoke with patient regarding BP  Recent Office Vitals: BP Readings from Last 3 Encounters:  12/17/20 120/78  08/12/20 (!) 148/92  08/05/20 (!) 142/99   Pulse Readings from Last 3 Encounters:  12/17/20 82  08/12/20 67  08/05/20 (!) 59    Wt Readings from Last 3 Encounters:  12/17/20 216 lb 9.6 oz (98.2 kg)  08/12/20 212 lb (96.2 kg)  08/05/20 212 lb (96.2 kg)     Kidney Function Lab Results  Component Value Date/Time   CREATININE 2.26 (H) 12/17/2020 11:38 AM   CREATININE 2.29 (H) 08/05/2020 08:26 AM   CREATININE 1.77 (H)  01/01/2017 08:10 AM   GFR 27.95 (L) 12/17/2020 11:38 AM   GFRNONAA 27 (L) 08/05/2020 08:26 AM   GFRAA 32 (L) 08/05/2020 08:26 AM    BMP Latest Ref Rng & Units 12/17/2020 08/05/2020 03/24/2020  Glucose 70 - 99 mg/dL 106(H) 105(H) 129(H)  BUN 6 - 23 mg/dL 41(H) 45(H) 42(H)  Creatinine 0.40 - 1.50 mg/dL 2.26(H) 2.29(H) 2.51(H)  BUN/Creat Ratio 10 - 24 - - -  Sodium 135 - 145 mEq/L 138 142 134(L)  Potassium 3.5 - 5.1 mEq/L 4.7 4.5 4.5  Chloride 96 - 112 mEq/L 106 108 104  CO2 19 - 32 mEq/L 25 26 24   Calcium 8.4 - 10.5 mg/dL 9.2 9.1 8.1(L)    . Current antihypertensive regimen:   Furosemide 20 mg daily as needed  Isosorbide Mononitrate 30 mg daily   Metoprolol tartrate 25 mg twice daily   Olmesartan-Amlodipine-HCTZ 40-10-12.5 mg 1/2 tab daily  . What recent interventions/DTPs have been made by any provider to improve Blood Pressure control since last CPP Visit: None ID . Any recent hospitalizations or ED visits since last visit with CPP? No  Adherence Review: Is the patient currently on ACE/ARB medication? Yes Does the patient have >5 day gap between last estimated fill dates? Yes  I have attempted without success to contact this patient by phone three times to do his Hypertension Disease State call. I left a Voice message for patient to return my call.  Left voice message on 02/07,02/11,02/14  Maryjean Ka  Follow-Up:  Pharmacist Review   Anderson Malta Clinical Pharmacist Assistant [phone]

## 2020-12-21 ENCOUNTER — Telehealth: Payer: Self-pay | Admitting: Family Medicine

## 2020-12-21 NOTE — Telephone Encounter (Signed)
Kidney function is stable, liver function is normal. Lipid panel is good. A1C increased to 7.0. no med change but f/u in 3 mo for A1C recheck

## 2020-12-21 NOTE — Telephone Encounter (Signed)
patient notified VIA phone that labs haven't been reviewed yet.  Will call him when they are reviewed by provider. Dm/cma

## 2020-12-21 NOTE — Telephone Encounter (Signed)
Pt called and asked if someone could please call him back with lab results, please advise

## 2020-12-22 NOTE — Telephone Encounter (Signed)
lft VM to rtn call. Dm/cma  

## 2020-12-22 NOTE — Telephone Encounter (Signed)
Notified patient of lab results.  Patient verbalized understanding.  

## 2021-01-07 ENCOUNTER — Telehealth: Payer: Self-pay

## 2021-01-07 NOTE — Progress Notes (Signed)
    Chronic Care Management Pharmacy Assistant   Name: Adam Harris  MRN: [id number] DOB: Sep 30, 1946  Reason for Encounter: Medication Review  PCP : Ronnald Nian, DO  Allergies:   Allergies  Allergen Reactions  . Xarelto [Rivaroxaban] Other (See Comments)    Skin Blisters     Medications: Outpatient Encounter Medications as of 01/07/2021  Medication Sig  . amLODipine (NORVASC) 5 MG tablet Take 1 tablet (5 mg total) by mouth daily.  Marland Kitchen apixaban (ELIQUIS) 5 MG TABS tablet Take 1 tablet (5 mg total) by mouth 2 (two) times daily.  . diphenhydramine-acetaminophen (TYLENOL PM) 25-500 MG TABS tablet Take 1 tablet by mouth at bedtime as needed (sleep).   . furosemide (LASIX) 20 MG tablet Take 1 tablet (20 mg total) by mouth daily as needed for edema.  . isosorbide mononitrate (IMDUR) 30 MG 24 hr tablet Take 1 tablet (30 mg total) by mouth daily.  . metoprolol tartrate (LOPRESSOR) 25 MG tablet TAKE ONE TABLET BY MOUTH TWICE A DAY (Patient taking differently: Take 25 mg by mouth 2 (two) times daily.)  . nitroGLYCERIN (NITROSTAT) 0.4 MG SL tablet PLACE ONE TABLET UNDER THE TONGUE EVERY 5 MINUTES TIMES THREE DOSES AS NEEDED FOR CHEST PAIN. CALL 911 IF 2ND DOSE DOES NOT HELP (Patient taking differently: Place 0.4 mg under the tongue every 5 (five) minutes as needed for chest pain. CALL 911 IF 2ND DOSE DOES NOT HELP)  . olmesartan (BENICAR) 20 MG tablet Take 1 tablet (20 mg total) by mouth daily.  . rosuvastatin (CRESTOR) 40 MG tablet Take 1 tablet (40 mg total) by mouth every morning.  . sitaGLIPtin (JANUVIA) 100 MG tablet Take 1 tablet (100 mg total) by mouth daily.   Facility-Administered Encounter Medications as of 01/07/2021  Medication  . sodium phosphate (FLEET) 7-19 GM/118ML enema 1 enema    Current Diagnosis: Patient Active Problem List   Diagnosis Date Noted  . 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 Educated about COVID-19 virus infection 10/28/2019  . Shingles 01/10/2018  .  Chronic diastolic CHF (congestive heart failure), NYHA class 2 ([place]) 04/02/2017  . Atrial fibrillation with RVR ([place]) 11/20/2016  . Degenerative arthritis of knee, bilateral 05/31/2016  . Low back pain 05/31/2016  . Routine general medical examination at a health care facility 12/25/2014  . Exertional angina ([place]) 10/29/2014  . Atrial fibrillation, new onset ([place]) 10/29/2014  . Chronic renal disease, stage III ([place]) 10/29/2014  . History of coronary artery stent placement 07/24/2014  . Coronary artery disease 03/09/2014  . Diabetes mellitus, type 2 ([place]) 02/17/2014  . Dyslipidemia 02/05/2014  . NSTEMI (non-ST elevated myocardial infarction) ([place]) 02/04/2014  . History of non-ST elevation myocardial infarction (NSTEMI) 02/03/2014  . Essential hypertension 02/03/2014  . Chest pain 02/03/2014  . Abnormal prostate specific antigen 04/19/2013  . Anxiety state 04/19/2013  . Increased frequency of urination 04/19/2013  . Malignant neoplasm of prostate ([place]) 04/19/2013  . Insomnia 04/19/2013  . Stiffness of joint, lower leg 04/19/2013  . Sciatica 04/19/2013  . Tension type headache 04/19/2013    Goals Addressed   None    Performed cost analysis for patient, estimated yearly medication cost of $0.00.  Follow-Up:  Pharmacist Review   Bessie Toco Pharmacist Assistant [phone]

## 2021-01-19 DIAGNOSIS — C61 Malignant neoplasm of prostate: Secondary | ICD-10-CM | POA: Diagnosis not present

## 2021-01-19 DIAGNOSIS — C678 Malignant neoplasm of overlapping sites of bladder: Secondary | ICD-10-CM | POA: Diagnosis not present

## 2021-01-19 DIAGNOSIS — C651 Malignant neoplasm of right renal pelvis: Secondary | ICD-10-CM | POA: Diagnosis not present

## 2021-01-21 ENCOUNTER — Other Ambulatory Visit: Payer: Self-pay | Admitting: Urology

## 2021-01-21 ENCOUNTER — Telehealth: Payer: Self-pay | Admitting: Cardiology

## 2021-01-21 NOTE — Telephone Encounter (Signed)
   [facility] Pre-operative Risk Assessment    HEARTCARE STAFF: - Please ensure there is not already an duplicate clearance open for this procedure. - Under Visit Info/Reason for Call, type in Other and utilize the format Clearance MM/DD/YY or Clearance TBD. Do not use dashes or single digits. - If request is for dental extraction, please clarify the # of teeth to be extracted.  Request for surgical clearance:  1. What type of surgery is being performed?  Cystoscopy, Transurethral resection of bladder tumor    2. When is this surgery scheduled? TBD pending Cardiology Clearance   3. What type of clearance is required (medical clearance vs. Pharmacy clearance to hold med vs. Both)? Both   4. Are there any medications that need to be held prior to surgery and how long? Eliquis 48 hr prior to procedure    5. Practice name and name of physician performing surgery? Dr. [name], [facility]    6. What is the office phone number? [phone]   7.   What is the office fax number? [phone]   8.   Anesthesia type (None, local, MAC, general) ? General with paralysis    Johnna Acosta 01/21/2021, 12:19 PM  _________________________________________________________________   (provider comments below)

## 2021-01-21 NOTE — Telephone Encounter (Signed)
Left message for patient to call office to schedule appointment for pre op clearence

## 2021-01-21 NOTE — Telephone Encounter (Signed)
Patient with diagnosis of afib on Eliquis for anticoagulation.    Procedure:  Cystoscopy, Transurethral resection of bladder tumor   Date of procedure: TBD  CHA2DS2-VASc Score = 5  This indicates a 7.2% annual risk of stroke. The patient's score is based upon: CHF History: Yes HTN History: Yes Diabetes History: Yes Stroke History: No Vascular Disease History: Yes Age Score: 1 Gender Score: 0      CrCl 34 ml/min  Per office protocol, patient can hold Eliquis for 2 days prior to procedure.

## 2021-01-21 NOTE — Telephone Encounter (Signed)
Primary Cardiologist:James Hochrein, MD  Chart reviewed as part of pre-operative protocol coverage. Because of Adam Harris's past medical history and time since last visit, he/she will require a follow-up visit in order to better assess preoperative cardiovascular risk.  Pre-op covering staff: - Please schedule appointment and call patient to inform them. - Please contact requesting surgeon's office via preferred method (i.e, phone, fax) to inform them of need for appointment prior to surgery.  If applicable, this message will also be routed to pharmacy pool and/or primary cardiologist for input on holding anticoagulant/antiplatelet agent as requested below so that this information is available at time of patient's appointment.   Deberah Pelton, NP  01/21/2021, 3:39 PM

## 2021-01-24 NOTE — Telephone Encounter (Signed)
Status: Sch   Time:  Length: 20  Visit Type: OFFICE VISIT [1004] Copay: $25.00  Provider:  Jeneen Rinks, MD     Pt already has appt scheduled 02/08/2021@9 :20 AM W/Dr Percival Spanish, pt notified, of upcoming appt, verbalized understanding

## 2021-01-24 NOTE — Telephone Encounter (Signed)
Left message for patient to call back to schedule pre-op clearance appointment, per staff message.

## 2021-02-02 NOTE — Patient Instructions (Addendum)
DUE TO COVID-19 ONLY ONE VISITOR IS ALLOWED TO COME WITH YOU AND STAY IN THE WAITING ROOM ONLY DURING PRE OP AND PROCEDURE DAY OF SURGERY. THE 1 VISITOR  MAY VISIT WITH YOU AFTER SURGERY IN YOUR PRIVATE ROOM DURING VISITING HOURS ONLY!  YOU NEED TO HAVE A COVID 19 TEST ON: 02/07/21 @ 8:45 AM , THIS TEST MUST BE DONE BEFORE SURGERY,  COVID TESTING SITE [place] [address], IT IS ON THE RIGHT GOING OUT [address] APPROXIMATELY  2 MINUTES PAST [employer] ON THE RIGHT. ONCE YOUR COVID TEST IS COMPLETED,  PLEASE BEGIN THE QUARANTINE INSTRUCTIONS AS OUTLINED IN YOUR HANDOUT.                Gumecindo Hopkin   Your procedure is scheduled on: 02/10/21   Report to [facility] Main  Entrance   Report to admitting at: 8:45 AM     Call this number if you have problems the morning of surgery [phone]    Remember: Do not eat solid food  :After Midnight. Clear liquids until: 7:45 am.  CLEAR LIQUID DIET  Foods Allowed                                                                     Foods Excluded  Coffee and tea, regular and decaf                             liquids that you cannot  Plain Jell-O any favor except red or purple                                           see through such as: Fruit ices (not with fruit pulp)                                     milk, soups, orange juice  Iced Popsicles                                    All solid food Carbonated beverages, regular and diet                                    Cranberry, grape and apple juices Sports drinks like Gatorade Lightly seasoned clear broth or consume(fat free) Sugar, honey syrup  Sample Menu Breakfast                                Lunch                                     Supper Cranberry juice  Beef broth                            Chicken broth Jell-O                                     Grape juice                           Apple juice Coffee or tea                         Jell-O                                      Popsicle                                                Coffee or tea                        Coffee or tea  _____________________________________________________________________  BRUSH YOUR TEETH MORNING OF SURGERY AND RINSE YOUR MOUTH OUT, NO CHEWING GUM CANDY OR MINTS.    Take these medicines the morning of surgery with A SIP OF WATER: amlodipine,isosorbide,metoprolol.  How to Manage Your Diabetes Before and After Surgery  Why is it important to control my blood sugar before and after surgery? . Improving blood sugar levels before and after surgery helps healing and can limit problems. . A way of improving blood sugar control is eating a healthy diet by: o  Eating less sugar and carbohydrates o  Increasing activity/exercise o  Talking with your doctor about reaching your blood sugar goals . High blood sugars (greater than 180 mg/dL) can raise your risk of infections and slow your recovery, so you will need to focus on controlling your diabetes during the weeks before surgery. . Make sure that the doctor who takes care of your diabetes knows about your planned surgery including the date and location.  How do I manage my blood sugar before surgery? . Check your blood sugar at least 4 times a day, starting 2 days before surgery, to make sure that the level is not too high or low. o Check your blood sugar the morning of your surgery when you wake up and every 2 hours until you get to the Short Stay unit. . If your blood sugar is less than 70 mg/dL, you will need to treat for low blood sugar: o Do not take insulin. o Treat a low blood sugar (less than 70 mg/dL) with  cup of clear juice (cranberry or apple), 4 glucose tablets, OR glucose gel. o Recheck blood sugar in 15 minutes after treatment (to make sure it is greater than 70 mg/dL). If your blood sugar is not greater than 70 mg/dL on recheck, call [phone] for further  instructions. . Report your blood sugar to the short stay nurse when you get to Short Stay.  . If you are admitted to the hospital after surgery: o Your blood sugar will be checked by the staff and you will probably be given insulin  after surgery (instead of oral diabetes medicines) to make sure you have good blood sugar levels. o The goal for blood sugar control after surgery is 80-180 mg/dL.   WHAT DO I DO ABOUT MY DIABETES MEDICATION?  Marland Kitchen Do not take oral diabetes medicines (pills) the morning of surgery.  . THE MORNING BEFORE SURGERY, take Januvia as usual.       THE MORNING OF SURGERY, DO NOT TAKE ANY DIABETIC MEDICATIONS DAY OF YOUR SURGERY                               You may not have any metal on your body including hair pins and              piercings  Do not wear jewelry, lotions, powders or perfumes, deodorant             Men may shave face and neck.   Do not bring valuables to the hospital. [place].  Contacts, dentures or bridgework may not be worn into surgery.  Leave suitcase in the car. After surgery it may be brought to your room.     Patients discharged the day of surgery will not be allowed to drive home. IF YOU ARE HAVING SURGERY AND GOING HOME THE SAME DAY, YOU MUST HAVE AN ADULT TO DRIVE YOU HOME AND BE WITH YOU FOR 24 HOURS. YOU MAY GO HOME BY TAXI OR UBER OR ORTHERWISE, BUT AN ADULT MUST ACCOMPANY YOU HOME AND STAY WITH YOU FOR 24 HOURS.  Name and phone number of your driver:  Special Instructions: N/A              Please read over the following fact sheets you were given: _____________________________________________________________________      [facility] - Preparing for Surgery Before surgery, you can play an important role.  Because skin is not sterile, your skin needs to be as free of germs as possible.  You can reduce the number of germs on your skin by washing with CHG (chlorahexidine gluconate) soap  before surgery.  CHG is an antiseptic cleaner which kills germs and bonds with the skin to continue killing germs even after washing. Please DO NOT use if you have an allergy to CHG or antibacterial soaps.  If your skin becomes reddened/irritated stop using the CHG and inform your nurse when you arrive at Short Stay. Do not shave (including legs and underarms) for at least 48 hours prior to the first CHG shower.  You may shave your face/neck. Please follow these instructions carefully:  1.  Shower with CHG Soap the night before surgery and the  morning of Surgery.  2.  If you choose to wash your hair, wash your hair first as usual with your  normal  shampoo.  3.  After you shampoo, rinse your hair and body thoroughly to remove the  shampoo.                           4.  Use CHG as you would any other liquid soap.  You can apply chg directly  to the skin and wash                       Gently with a scrungie or clean  washcloth.  5.  Apply the CHG Soap to your body ONLY FROM THE NECK DOWN.   Do not use on face/ open                           Wound or open sores. Avoid contact with eyes, ears mouth and genitals (private parts).                       Wash face,  Genitals (private parts) with your normal soap.             6.  Wash thoroughly, paying special attention to the area where your surgery  will be performed.  7.  Thoroughly rinse your body with warm water from the neck down.  8.  DO NOT shower/wash with your normal soap after using and rinsing off  the CHG Soap.                9.  Pat yourself dry with a clean towel.            10.  Wear clean pajamas.            11.  Place clean sheets on your bed the night of your first shower and do not  sleep with pets. Day of Surgery : Do not apply any lotions/deodorants the morning of surgery.  Please wear clean clothes to the hospital/surgery center.  FAILURE TO FOLLOW THESE INSTRUCTIONS MAY RESULT IN THE CANCELLATION OF YOUR SURGERY PATIENT  SIGNATURE_________________________________  NURSE SIGNATURE__________________________________  ________________________________________________________________________

## 2021-02-03 ENCOUNTER — Encounter (HOSPITAL_COMMUNITY)
Admission: RE | Admit: 2021-02-03 | Discharge: 2021-02-03 | Disposition: A | Discharge status: Home / Self Care | Payer: Medicare Other | Source: Ambulatory Visit | Attending: Urology | Admitting: Urology

## 2021-02-03 ENCOUNTER — Other Ambulatory Visit: Payer: Self-pay

## 2021-02-03 ENCOUNTER — Encounter (HOSPITAL_COMMUNITY): Payer: Self-pay

## 2021-02-03 DIAGNOSIS — Z01818 Encounter for other preprocedural examination: Secondary | ICD-10-CM | POA: Insufficient documentation

## 2021-02-03 HISTORY — DX: Malignant (primary) neoplasm, unspecified: C80.1

## 2021-02-03 HISTORY — DX: Dyspnea, unspecified: R06.00

## 2021-02-03 LAB — BASIC METABOLIC PANEL
Anion gap: 6 (ref 5–15)
BUN: 38 mg/dL — ABNORMAL HIGH (ref 8–23)
CO2: 24 mmol/L (ref 22–32)
Calcium: 9.3 mg/dL (ref 8.9–10.3)
Chloride: 110 mmol/L (ref 98–111)
Creatinine, Ser: 2.34 mg/dL — ABNORMAL HIGH (ref 0.61–1.24)
GFR, Estimated: 28 mL/min — ABNORMAL LOW (ref >60)
Glucose, Bld: 111 mg/dL — ABNORMAL HIGH (ref 70–99)
Potassium: 4.8 mmol/L (ref 3.5–5.1)
Sodium: 140 mmol/L (ref 135–145)

## 2021-02-03 LAB — CBC
HCT: 46.7 % (ref 39.0–52.0)
Hemoglobin: 15 g/dL (ref 13.0–17.0)
MCH: 28.8 pg (ref 26.0–34.0)
MCHC: 32.1 g/dL (ref 30.0–36.0)
MCV: 89.6 fL (ref 80.0–100.0)
Platelets: 143 10*3/uL — ABNORMAL LOW (ref 150–400)
RBC: 5.21 MIL/uL (ref 4.22–5.81)
RDW: 14.6 % (ref 11.5–15.5)
WBC: 8.2 10*3/uL (ref 4.0–10.5)
nRBC: 0 % (ref 0.0–0.2)

## 2021-02-03 LAB — GLUCOSE, CAPILLARY: Glucose-Capillary: 151 mg/dL — ABNORMAL HIGH (ref 70–99)

## 2021-02-03 NOTE — Progress Notes (Signed)
COVID Vaccine Completed: NO Date COVID Vaccine completed: COVID vaccine manufacturer: [place]   PCP - Stanton Kidney Cirigliano: DO Cardiologist - Dr. Minus Breeding. LOV: 12/17/20  Chest x-ray -  EKG -  Stress Test -  ECHO - 11/22/16  Cardiac Cath -  Pacemaker/ICD device last checked: A1-C: 7.0: 12/17/20 Sleep Study -  CPAP -   Fasting Blood Sugar - N/A Checks Blood Sugar ___0__ times a day  Blood Thinner Instructions: Aspirin Instructions: Last Dose:  Anesthesia review: Hx: HTN,Afib,MI,CAD,DIA  Patient denies shortness of breath, fever, cough and chest pain at PAT appointment   Patient verbalized understanding of instructions that were given to them at the PAT appointment. Patient was also instructed that they will need to review over the PAT instructions again at home before surgery.COVID Vaccine Completed: Date COVID Vaccine completed: COVID vaccine manufacturer: [place]   PCP -  Cardiologist -   Chest x-ray -  EKG -  Stress Test -  ECHO -  Cardiac Cath -  Pacemaker/ICD device last checked:  Sleep Study -  CPAP -   Fasting Blood Sugar -  Checks Blood Sugar _____ times a day  Blood Thinner Instructions: Aspirin Instructions: Last Dose:  Anesthesia review:   Patient denies shortness of breath, fever, cough and chest pain at PAT appointment   Patient verbalized understanding of instructions that were given to them at the PAT appointment. Patient was also instructed that they will need to review over the PAT instructions again at home before surgery.

## 2021-02-04 NOTE — Progress Notes (Signed)
Anesthesia Chart Review   Case: [id number] Date/Time: 02/10/21 1030   Procedure: TRANSURETHRAL RESECTION OF BLADDER TUMOR (TURBT)/ CYSTOSCOPY/ LEFT RETROGRADE (N/A ) - GENERAL ANESTHESIA WITH PARALYSIS   Anesthesia type: General   Pre-op diagnosis: BLADDER CANCER   Location: [place] [facility]   Surgeons: [name], MD      DISCUSSION:75 y.o. never smoker with h/o HTN, DM II, A-fib, CAD, chronic diastolic heart failure, CKD Stage III, bladder cancer scheduled for above procedure 02/10/2021 with Dr. [name].   Pt has an upcoming appointment with cardiology 02/08/21.   Addendum 02/09/2021:  Pt seen by cardiology 02/08/2021. Per OV note, "PREOP: The patient is at acceptable risk for the planned surgery.  He has a high functional status.  It is not a high risk procedure.  There are no high risk findings.  According to ACC/AHA guidelines no further testing is indicated."  Anticipate pt can proceed with planned procedure barring acute status change.   VS: BP (!) 144/96   Pulse 84   Temp 36.6 C (Oral)   Ht 6' (1.829 m)   Wt 96.2 kg   SpO2 98%   BMI 28.75 kg/m   PROVIDERS: Ronnald Nian, DO is PCP   Minus Breeding, MD is Cardiologist  LABS: Labs reviewed: Acceptable for surgery. (all labs ordered are listed, but only abnormal results are displayed)  Labs Reviewed  BASIC METABOLIC PANEL - Abnormal; Notable for the following components:      Result Value   Glucose, Bld 111 (*)    BUN 38 (*)    Creatinine, Ser 2.34 (*)    GFR, Estimated 28 (*)    All other components within normal limits  CBC - Abnormal; Notable for the following components:   Platelets 143 (*)    All other components within normal limits  GLUCOSE, CAPILLARY - Abnormal; Notable for the following components:   Glucose-Capillary 151 (*)    All other components within normal limits     IMAGES:   EKG: 02/03/2021 Rate 63 bpm  Atrial fibrillation ST & T wave abnormality, consider lateral  ischemia Abnormal ECG No significant change since last tracing  CV: Echo 11/22/2016 Study Conclusions   - Left ventricle: The cavity size was normal. Systolic function was  normal. The estimated ejection fraction was in the range of 55%  to 60%. Wall motion was normal; there were no regional wall  motion abnormalities.  - Aortic valve: Trileaflet; moderately thickened, moderately  calcified leaflets.  - Mitral valve: There was mild regurgitation.  - Left atrium: The atrium was moderately dilated.  - Tricuspid valve: There was mild regurgitation.  - Pulmonary arteries: Systolic pressure was mildly increased. PA  peak pressure: 37 mm Hg (S).   Impressions:   - Compared to the prior study, there has been no significant  interval change.  Past Medical History:  Diagnosis Date  . Abnormal radiologic findings on diagnostic imaging of renal pelvis, ureter, or bladder    bilateral ureter abnormalities  . Anticoagulant long-term use    eliquis  . Anxiety   . Arthritis   . CAD (coronary artery disease) cardiologist-- dr hochrein   NSTEMI 02-04-2014  per cardiac cath chronic occluded RCA w/ faint left-to-right collaterals and aneurysmal LCFx with sluggish coronary flow/   NSTEMI --11-21-2016 per cardiac cath occluded proximal RCA & mid to diastal CFX 100%, med rx. If that does not work, PTCA or CABG  . Cancer ([place])    bladder and  kidney  . Chronic diastolic heart failure ([place])   . CKD (chronic kidney disease), stage III [facility])    patient unaware  . DOE (dyspnea on exertion)    2-3 flights of stairs  . Dyspnea    With exertion  . Dysrhythmia 2019   A-Fib  . Fatty liver    pt denies  . Hematuria   . Hematuria 02/2019  . History of COVID-19 10/2019  . History of non-ST elevation myocardial infarction (NSTEMI)    02-04-2014  and 11-21-2016  cardiac cath done both times ,  medically management  . History of shingles 12/2017   slight pain and numbness still noted in the  area  . Hyperlipidemia   . Hypertension   . Insomnia   . Myocardial infarction ([place]) 2015  . Persistent atrial fibrillation [facility])    cardiologsit-- dr Percival Spanish  . Sciatica   . Thoracic aortic atherosclerosis ([place])   . Type 2 diabetes mellitus ([place])   . Urinary frequency     Past Surgical History:  Procedure Laterality Date  . CARDIAC CATHETERIZATION N/A 11/21/2016   Procedure: Left Heart Cath and Coronary Angiography;  Surgeon: Troy Sine, MD;  Location: [place] CV LAB;  Service: Cardiovascular;  Laterality: N/A;  pRCA 100% , ostial LAD 45%, OM3 80%, mCFX to dCFX 100% (AV groove), lateral OM3 50%  . COLONOSCOPY    . CYSTOSCOPY WITH BIOPSY N/A 08/12/2020   Procedure: CYSTOSCOPY WITH BLADDER BIOPSY AND TRANSURETHRAL RESECTION OF BLADDER TUMOR;  Surgeon: [name], MD;  [facility];  Service: Urology;  Laterality: N/A;  . CYSTOSCOPY WITH URETEROSCOPY AND STENT PLACEMENT Right 12/15/2019   Procedure: CYSTOSCOPY WITH RIGHT RETROGRADE/ RIGHT URETEROSCOPY/ BIOPSY;  Surgeon: [name], MD;  Location: [place];  Service: Urology;  Laterality: Right;  . CYSTOSCOPY/RETROGRADE/URETEROSCOPY Bilateral 03/18/2018   Procedure: CYSTOSCOPY/RETROGRADE/URETEROSCOPY.;  Surgeon: [name], MD;  Location: [facility];  Service: Urology;  Laterality: Bilateral;  . KIDNEY SURGERY    . KNEE ARTHROSCOPY Right   . LEFT HEART CATHETERIZATION WITH CORONARY ANGIOGRAM N/A 02/04/2014   Procedure: LEFT HEART CATHETERIZATION WITH CORONARY ANGIOGRAM;  Surgeon: [name], MD;  Location: [place] CATH LAB;  Service: Cardiovascular;  Laterality: N/A;  severe one-vessel CAD, chronically occluded RCA with faint left-to-right collaterals;  aneurysmal LCFx with sluggish coronary flow;  normal LVSF w/ moderately elevated LVEDP (ostialOM2 20%, pOM3 20%, pD1 20%, mCFX 50%, diffuse 20% pCFX)  . PROSTATE BIOPSY N/A 08/12/2020   Procedure: BIOPSY TRANSRECTAL ULTRASONIC PROSTATE (TUBP);   Surgeon: [name], MD;  [facility];  Service: Urology;  Laterality: N/A;  . ROBOT ASSITED LAPAROSCOPIC NEPHROURETERECTOMY Right 03/22/2020   Procedure: XI ROBOT ASSITED LAPAROSCOPIC NEPHROURETERECTOMY;  Surgeon: [name], MD;  Location[facility];  Service: Urology;  Laterality: Right;  . TRANSTHORACIC ECHOCARDIOGRAM  11-22-2016   dr hochrein   ef 55-60%/ mild MR and TR/ moderate LAE    MEDICATIONS: . amLODipine (NORVASC) 5 MG tablet  . apixaban (ELIQUIS) 5 MG TABS tablet  . diphenhydramine-acetaminophen (TYLENOL PM) 25-500 MG TABS tablet  . furosemide (LASIX) 20 MG tablet  . isosorbide mononitrate (IMDUR) 30 MG 24 hr tablet  . metoprolol tartrate (LOPRESSOR) 25 MG tablet  . nitroGLYCERIN (NITROSTAT) 0.4 MG SL tablet  . olmesartan (BENICAR) 20 MG tablet  . rosuvastatin (CRESTOR) 40 MG tablet  . sitaGLIPtin (JANUVIA) 100 MG tablet   No current facility-administered medications for this encounter.   . sodium phosphate (FLEET) 7-19 GM/118ML enema  1 enema    Konrad Felix, PA-C [facility] Pre-Surgical Testing [phone]

## 2021-02-04 NOTE — Telephone Encounter (Signed)
Patient needs clarification about how long to hold his blood thinner prior to his surgery. The patient was told at his pre-surgery appt yesterday he needs to hold it for 3 days, but he does not see Dr. Percival Spanish until 3/29. If he waited until he sees Dr. Percival Spanish he would only hold the medication for 2 days.   Patient does not want to delay surgery. Please advise

## 2021-02-07 ENCOUNTER — Encounter: Payer: Self-pay | Admitting: Cardiology

## 2021-02-07 ENCOUNTER — Other Ambulatory Visit (HOSPITAL_COMMUNITY)
Admission: RE | Admit: 2021-02-07 | Discharge: 2021-02-07 | Disposition: A | Discharge status: Home / Self Care | Payer: Medicare Other | Source: Ambulatory Visit | Attending: Urology | Admitting: Urology

## 2021-02-07 DIAGNOSIS — Z01812 Encounter for preprocedural laboratory examination: Secondary | ICD-10-CM | POA: Insufficient documentation

## 2021-02-07 DIAGNOSIS — Z20822 Contact with and (suspected) exposure to covid-19: Secondary | ICD-10-CM | POA: Insufficient documentation

## 2021-02-07 LAB — SARS CORONAVIRUS 2 (TAT 6-24 HRS): SARS Coronavirus 2: NEGATIVE

## 2021-02-07 NOTE — Telephone Encounter (Signed)
Notes will be forwarded to Dr. Percival Spanish for pre op appt 02/08/21.

## 2021-02-07 NOTE — Telephone Encounter (Signed)
   Primary Cardiologist: Minus Breeding, MD  Pre-op team:  Please let the patient know about pharmacy holding recommendations as below:  Per office protocol, patient can hold Eliquis for 2 days prior to procedure. Therefore he should keep his appointment with Dr. Percival Spanish for medical clearance prior to his procedure.   Kathyrn Drown, NP 02/07/2021, 10:04 AM

## 2021-02-07 NOTE — Progress Notes (Signed)
Cardiology Office Note   Date:  02/08/2021   ID:  Adam Harris, DOB March 08, 1946, MRN [id number]  PCP:  Ronnald Nian, DO  Cardiologist:   Minus Breeding, MD    No chief complaint on file.     History of Present Illness: Adam Harris is a 75 y.o. male who presents for atrial fib and CAD and atrial fibrillation.  Most recent cardiac cath  01/2014 revealed severe 1 vessel disease with CTO RCA with faint left to right collaterals, aneurysmal LCFx with sluggish coronary flow; normal LVSF w/ moderately elevated LVEDP (ostialOM2 20%, pOM3 20%, pD1 20%, mCFX 50%, diffuse 20% pCFX). Chronic diastolic CHF, atrial fib and HL.   He is here in part for preop prior prior to bladder biopsy.  Since I last saw him he has done okay.  He still works part-time at the [employer].  The patient denies any new symptoms such as chest discomfort, neck or arm discomfort. There has been no new shortness of breath, PND or orthopnea. There have been no reported palpitations, presyncope or syncope.  He  he says that he might get short of breath 2 or 3 flights of stairs.  Of note he is having a bladder biopsy in a couple of days and needs preop clearance for this.  Past Medical History:  Diagnosis Date  . Abnormal radiologic findings on diagnostic imaging of renal pelvis, ureter, or bladder    bilateral ureter abnormalities  . Anticoagulant long-term use    eliquis  . Anxiety   . Arthritis   . CAD (coronary artery disease) cardiologist-- dr Delaynie Stetzer   NSTEMI 02-04-2014  per cardiac cath chronic occluded RCA w/ faint left-to-right collaterals and aneurysmal LCFx with sluggish coronary flow/   NSTEMI --11-21-2016 per cardiac cath occluded proximal RCA & mid to diastal CFX 100%, med rx. If that does not work, PTCA or CABG  . Cancer ([place])    bladder and kidney  . Chronic diastolic heart failure ([place])   . CKD (chronic kidney disease), stage III [facility])    patient unaware  . DOE (dyspnea on exertion)    2-3 flights of  stairs  . Dyspnea    With exertion  . Fatty liver    pt denies  . Hematuria 02/2019  . History of COVID-19 10/2019  . History of non-ST elevation myocardial infarction (NSTEMI)    02-04-2014  and 11-21-2016  cardiac cath done both times ,  medically management  . History of shingles 12/2017   slight pain and numbness still noted in the area  . Hyperlipidemia   . Hypertension   . Insomnia   . Myocardial infarction ([place]) 2015  . Persistent atrial fibrillation [facility])    cardiologsit-- dr Percival Spanish  . Sciatica   . Thoracic aortic atherosclerosis ([place])   . Type 2 diabetes mellitus ([place])   . Urinary frequency     Past Surgical History:  Procedure Laterality Date  . CARDIAC CATHETERIZATION N/A 11/21/2016   Procedure: Left Heart Cath and Coronary Angiography;  Surgeon: Troy Sine, MD;  Location: [place] [facility];  Service: Cardiovascular;  Laterality: N/A;  pRCA 100% , ostial LAD 45%, OM3 80%, mCFX to dCFX 100% (AV groove), lateral OM3 50%  . COLONOSCOPY    . CYSTOSCOPY WITH BIOPSY N/A 08/12/2020   Procedure: CYSTOSCOPY WITH BLADDER BIOPSY AND TRANSURETHRAL RESECTION OF BLADDER TUMOR;  Surgeon: [name], MD;  Location: [facility];  Service: Urology;  Laterality: N/A;  . CYSTOSCOPY WITH  URETEROSCOPY AND STENT PLACEMENT Right 12/15/2019   Procedure: CYSTOSCOPY WITH RIGHT RETROGRADE/ RIGHT URETEROSCOPY/ BIOPSY;  Surgeon: [name], MD;  Location: [place];  Service: Urology;  Laterality: Right;  . CYSTOSCOPY/RETROGRADE/URETEROSCOPY Bilateral 03/18/2018   Procedure: CYSTOSCOPY/RETROGRADE/URETEROSCOPY.;  Surgeon: [name], MD;  Location: [facility];  Service: Urology;  Laterality: Bilateral;  . KIDNEY SURGERY    . KNEE ARTHROSCOPY Right   . LEFT HEART CATHETERIZATION WITH CORONARY ANGIOGRAM N/A 02/04/2014   Procedure: LEFT HEART CATHETERIZATION WITH CORONARY ANGIOGRAM;  Surgeon: [name], MD;  Location: [facility];  Service: Cardiovascular;   Laterality: N/A;  severe one-vessel CAD, chronically occluded RCA with faint left-to-right collaterals;  aneurysmal LCFx with sluggish coronary flow;  normal LVSF w/ moderately elevated LVEDP (ostialOM2 20%, pOM3 20%, pD1 20%, mCFX 50%, diffuse 20% pCFX)  . PROSTATE BIOPSY N/A 08/12/2020   Procedure: BIOPSY TRANSRECTAL ULTRASONIC PROSTATE (TUBP);  Surgeon: [name], MD;  Location: [facility];  Service: Urology;  Laterality: N/A;  . ROBOT ASSITED LAPAROSCOPIC NEPHROURETERECTOMY Right 03/22/2020   Procedure: XI ROBOT ASSITED LAPAROSCOPIC NEPHROURETERECTOMY;  Surgeon: [name], MD;  Location: [facility];  Service: Urology;  Laterality: Right;  . TRANSTHORACIC ECHOCARDIOGRAM  11-22-2016   dr Mykai Wendorf   ef 55-60%/ mild MR and TR/ moderate LAE     Current Outpatient Medications  Medication Sig Dispense Refill  . amLODipine (NORVASC) 5 MG tablet Take 1 tablet (5 mg total) by mouth daily. 90 tablet 3  . apixaban (ELIQUIS) 5 MG TABS tablet Take 1 tablet (5 mg total) by mouth 2 (two) times daily. 180 tablet 1  . diphenhydramine-acetaminophen (TYLENOL PM) 25-500 MG TABS tablet Take 0.5 tablets by mouth at bedtime as needed (sleep).    . furosemide (LASIX) 20 MG tablet Take 1 tablet (20 mg total) by mouth daily as needed for edema. 90 tablet 1  . isosorbide mononitrate (IMDUR) 30 MG 24 hr tablet Take 1 tablet (30 mg total) by mouth daily. 90 tablet 3  . metoprolol tartrate (LOPRESSOR) 25 MG tablet TAKE ONE TABLET BY MOUTH TWICE A DAY 180 tablet 2  . nitroGLYCERIN (NITROSTAT) 0.4 MG SL tablet PLACE ONE TABLET UNDER THE TONGUE EVERY 5 MINUTES TIMES THREE DOSES AS NEEDED FOR CHEST PAIN. CALL 911 IF 2ND DOSE DOES NOT HELP 25 tablet 5  . olmesartan (BENICAR) 20 MG tablet Take 1 tablet (20 mg total) by mouth daily. 90 tablet 3  . rosuvastatin (CRESTOR) 40 MG tablet Take 1 tablet (40 mg total) by mouth every morning. 90 tablet 3  . sitaGLIPtin (JANUVIA) 100 MG tablet Take 1 tablet (100 mg total) by mouth daily.  90 tablet 3   No current facility-administered medications for this visit.   Facility-Administered Medications Ordered in Other Visits  Medication Dose Route Frequency Provider Last Rate Last Admin  . sodium phosphate (FLEET) 7-19 GM/118ML enema 1 enema  1 enema Rectal Once [name], MD        Allergies:   Xarelto [rivaroxaban]    ROS:  Please see the history of present illness.   Otherwise, review of systems are negative for all none.   All other systems are reviewed and negative.    PHYSICAL EXAM: VS:  BP 110/72 (BP Location: Left Arm, Patient Position: Sitting)   Pulse (!) 53   Ht 5\' 11"  (1.803 m)   Wt 218 lb 3.2 oz (99 kg)   SpO2 98%   BMI 30.43 kg/m  , BMI Body mass index is  30.43 kg/m.  GENERAL:  Well appearing NECK:  No jugular venous distention, waveform within normal limits, carotid upstroke brisk and symmetric, no bruits, no thyromegaly LUNGS:  Clear to auscultation bilaterally CHEST:  Unremarkable HEART:  PMI not displaced or sustained,S1 and S2 within normal limits, no S3, no clicks, no rubs, no murmurs, irregular  ABD:  Flat, positive bowel sounds normal in frequency in pitch, no bruits, no rebound, no guarding, no midline pulsatile mass, no hepatomegaly, no splenomegaly EXT:  2 plus pulses throughout, no edema, no cyanosis no clubbing   EKG:  EKG is  ordered today. Atrial fibrillation, rate 53, axis within normal limits.  No changes.  Poor anterior R wave progression.  Cannot exclude anteroseptal MI.  Recent Labs: 12/17/2020: ALT 11 02/03/2021: BUN 38; Creatinine, Ser 2.34; Hemoglobin 15.0; Platelets 143; Potassium 4.8; Sodium 140    Lipid Panel    Component Value Date/Time   CHOL 114 12/17/2020 1138   CHOL 149 06/25/2019 1621   TRIG 43.0 12/17/2020 1138   HDL 39.30 12/17/2020 1138   HDL 43 06/25/2019 1621   CHOLHDL 3 12/17/2020 1138   VLDL 8.6 12/17/2020 1138   LDLCALC 66 12/17/2020 1138   LDLCALC 88 06/25/2019 1621      Wt Readings from Last  3 Encounters:  02/08/21 218 lb 3.2 oz (99 kg)  02/03/21 212 lb (96.2 kg)  12/17/20 216 lb 9.6 oz (98.2 kg)      Other studies Reviewed: Additional studies/ records that were reviewed today include: Labs Review of the above records demonstrates:   See elsewhere   ASSESSMENT AND PLAN:  CHRONIC ATRIAL FIB:  Mr. Stefanos Haynesworth has a CHA2DS2 - VASc score of 3.    He is tolerating anticoagulation.  No change in therapy.   He can hold his Eliquis for the procedure.  I asked him to ask the operating surgeon when he should resume this.  He has not noticed his fibrillation.  CAD:   The patient has no new sypmtoms.  No further cardiovascular testing is indicated.  We will continue with aggressive risk reduction and meds as listed.  HTN:  The blood pressure is at target. No change in medications is indicated. We will continue with therapeutic lifestyle changes (TLC).  CHRONIC DIASTOLIC HF:   He seems to be euvolemic.  No change in therapy.  DYSLIPIDEMIA:    LDL was 66 with an HDL of 39.  No change in therapy.   DM: His A1c is 7.0.  No change in therapy.  PREOP: The patient is at acceptable risk for the planned surgery.  He has a high functional status.  It is not a high risk procedure.  There are no high risk findings.  According to ACC/AHA guidelines no further testing is indicated.   Current medicines are reviewed at length with the patient today.  The patient does not have concerns regarding medicines.  The following changes have been made:  None  Labs/ tests ordered today include:  None  Orders Placed This Encounter  Procedures  . EKG 12-Lead     Disposition:   FU with me in one year.    Signed, Minus Breeding, MD  02/08/2021 10:30 AM    [place]

## 2021-02-08 ENCOUNTER — Other Ambulatory Visit: Payer: Self-pay

## 2021-02-08 ENCOUNTER — Encounter: Payer: Self-pay | Admitting: Cardiology

## 2021-02-08 ENCOUNTER — Ambulatory Visit (INDEPENDENT_AMBULATORY_CARE_PROVIDER_SITE_OTHER): Payer: Medicare Other | Admitting: Cardiology

## 2021-02-08 VITALS — BP 110/72 | HR 53 | Ht 71.0 in | Wt 218.2 lb

## 2021-02-08 DIAGNOSIS — I2510 Atherosclerotic heart disease of native coronary artery without angina pectoris: Secondary | ICD-10-CM | POA: Diagnosis not present

## 2021-02-08 DIAGNOSIS — I10 Essential (primary) hypertension: Secondary | ICD-10-CM | POA: Diagnosis not present

## 2021-02-08 DIAGNOSIS — E785 Hyperlipidemia, unspecified: Secondary | ICD-10-CM

## 2021-02-08 DIAGNOSIS — I482 Chronic atrial fibrillation, unspecified: Secondary | ICD-10-CM | POA: Diagnosis not present

## 2021-02-08 DIAGNOSIS — I5032 Chronic diastolic (congestive) heart failure: Secondary | ICD-10-CM | POA: Diagnosis not present

## 2021-02-08 NOTE — Patient Instructions (Signed)
Medication Instructions:  Continue current medications  *If you need a refill on your cardiac medications before your next appointment, please call your pharmacy*   Lab Work: None Ordered   Testing/Procedures: None ordered   Follow-Up: At [facility], you and your health needs are our priority.  As part of our continuing mission to provide you with exceptional heart care, we have created designated Provider Care Teams.  These Care Teams include your primary Cardiologist (physician) and Advanced Practice Providers (APPs -  Physician Assistants and Nurse Practitioners) who all work together to provide you with the care you need, when you need it.  We recommend signing up for the patient portal called "MyChart".  Sign up information is provided on this After Visit Summary.  MyChart is used to connect with patients for Virtual Visits (Telemedicine).  Patients are able to view lab/test results, encounter notes, upcoming appointments, etc.  Non-urgent messages can be sent to your provider as well.   To learn more about what you can do with MyChart, go to [email or web address].    Your next appointment:   1 year(s)  The format for your next appointment:   In Person  Provider:   You may see James Hochrein, MD or one of the following Advanced Practice Providers on your designated Care Team:    Rhonda Barrett, PA-C  Kathryn Lawrence, DNP, ANP     

## 2021-02-09 ENCOUNTER — Ambulatory Visit: Payer: Medicare Other | Admitting: Physician Assistant

## 2021-02-09 NOTE — Anesthesia Preprocedure Evaluation (Addendum)
Anesthesia Evaluation  Patient identified by MRN, date of birth, ID band Patient awake    Reviewed: Allergy & Precautions, NPO status , Patient's Chart, lab work & pertinent test results  Airway Mallampati: II  TM Distance: >3 FB Neck ROM: Full    Dental  (+) Dental Advisory Given   Pulmonary shortness of breath,    breath sounds clear to auscultation       Cardiovascular hypertension, Pt. on medications and Pt. on home beta blockers + CAD, + Past MI and +CHF  + dysrhythmias Atrial Fibrillation  Rhythm:Regular Rate:Normal     Neuro/Psych  Headaches,  Neuromuscular disease    GI/Hepatic negative GI ROS, Neg liver ROS,   Endo/Other  diabetes  Renal/GU CRFRenal disease   Bladder CA    Musculoskeletal  (+) Arthritis ,   Abdominal   Peds  Hematology negative hematology ROS (+)   Anesthesia Other Findings   Reproductive/Obstetrics                            Anesthesia Physical Anesthesia Plan  ASA: III  Anesthesia Plan: General   Post-op Pain Management:    Induction: Intravenous  PONV Risk Score and Plan: 2 and Dexamethasone, Ondansetron and Treatment may vary due to age or medical condition  Airway Management Planned: Oral ETT  Additional Equipment: None  Intra-op Plan:   Post-operative Plan: Extubation in OR  Informed Consent: I have reviewed the patients History and Physical, chart, labs and discussed the procedure including the risks, benefits and alternatives for the proposed anesthesia with the patient or authorized representative who has indicated his/her understanding and acceptance.     Dental advisory given  Plan Discussed with: CRNA  Anesthesia Plan Comments:        Anesthesia Quick Evaluation

## 2021-02-09 NOTE — H&P (Signed)
Office Visit Report     01/19/2021   --------------------------------------------------------------------------------   Adam Harris  MRN: [id number]  DOB: [birth year], 75 year old Male  SSN: [id number]   PRIMARY CARE:  Letta Median, MD  REFERRING:  Georgette Dover, MD  PROVIDER:  [name], M.D.  TREATING:  [name], M.D.  LOCATION:  [facility] [phone]     --------------------------------------------------------------------------------   CC/HPI: 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 Prostate cancer   He returns today after completion of induction course of BCG. He had undergone a 6 week course of BCG for carcinoma in situ of the bladder prior to his right nephroureterectomy. He subsequently was noted to have a recurrence and was found have a high-grade, Ta bladder cancer. It was felt that this may be related to possible seeding from his kidney prior to removal. He underwent a repeat induction course of BCG for 6 weeks and follows up after that treatment for cystoscopic evaluation. He states that he has had some intermittent hematuria. He continues to have significant urinary frequency. Overall, he tolerated BCG quite well with minimal side effects otherwise.     ALLERGIES: No Allergies    MEDICATIONS: Tamsulosin Hcl 0.4 mg capsule 1 capsule PO Q HS  Atorvastatin Calcium 20 MG Oral Tablet Oral  Eliquis 5 mg tablet Oral  MetFORMIN HCl - 500 MG Oral Tablet Oral  Metoprolol Tartrate 25 MG Oral Tablet Oral  Nitrostat 0.4 MG Sublingual Tablet Sublingual Sublingual  Tribenzor 40 mg-10 mg-25 mg tablet Oral     GU PSH: Bladder Instill AntiCA Agent - 10/18/2020, 10/11/2020, 09/30/2020, 09/23/2020, 09/16/2020, 09/02/2020, 02/18/2020, 02/11/2020, 02/04/2020, 01/28/2020, 01/21/2020, 01/14/2020 Cysto Bladder Ureth Biopsy - 12/15/2019 Cysto Uretero Biopsy Fulgura, Right - 12/15/2019 Cystoscopy -  06/23/2020, 03/05/2020, 2019 Cystoscopy TURBT 2-5 cm - 08/12/2020 Cystoscopy Ureteroscopy, Bilateral - 2019 Inject For cystogram - 03/26/2020 Lap Nephro Ureterectomy, Right - 03/22/2020 Locm 300-399Mg /Ml Iodine,1Ml - 01/14/2020, 11/24/2019, 2019 Prostate Needle Biopsy - 08/12/2020, 2018       PSH Notes: Knee Arthroscopy   NON-GU PSH: Surgical Pathology, Gross And Microscopic Examination For Prostate Needle - 2018     GU PMH: Bladder Cancer overlapping sites - 10/18/2020, - 10/11/2020, - 09/30/2020, - 09/23/2020, - 09/16/2020, - 09/09/2020, - 09/02/2020, - 08/24/2020 Prostate Cancer - 08/24/2020, (Stable), His prostate remains benign to exam. I will obtain a PSA and have recommended he return in 6 months for repeat DRE and PSA., - 02/27/2019 (Stable), His prostate was noted to be entirely benign today. I will obtain a PSA today since he has due for a recheck., - 2019 (Stable), I went over his pathology report with him today which has revealed no evidence of grade or stage progression of his low risk prostate cancer. My recommendation to him has been continued active surveillance and he has agreed with this plan., - 2018 (Stable), I have discussed with the patient the possibility of blood per rectum, per urethra and in the ejaculate. He was counseled to contact me if he has any difficulties following his prostate biopsy whatsoever., - 2018 (Stable), His prostate was noted to be smooth and benign to examination however his PSA has risen significantly. It was 8.3 when we did his biopsy initially and then came down quite low. I recommended we repeat the PSA today but also I told him this time we proceed with a repeat biopsy., - 2018 (Stable),  His prostate remains benign on his examination and his PSA continues to remain low and stable. I will continue active surveillance with DRE and PSA again in 6 months., - 2017, Adenocarcinoma of prostate, - 2017 Renal pelvis cancer, right - 08/24/2020, (Stable), - 04/20/2020,  Right, - 12/15/2019 Abnormal radiologic findings on diagnostic imaging of of renal pelvis, ureter, or bladder, Bilateral - 08/13/2020, Bilateral, Due to the finding of abnormalities of both his right ureter and left renal pelvic region we are going to proceed with further evaluation with cystoscopy, bilateral retrograde pyelography, bilateral ureteroscopy and possible bilateral double-J stent placement., - 2019 CIS of the bladder - 12/15/2019 Gross hematuria, Continues to have intermittent gross hematuria. It seems to be associated with some pain in his right flank region but no history of stones. With his previous abnormality of his ureter I have recommended we repeat a CT scan with and without contrast and then will determine further evaluation once that is been completed. - 10/21/2019, He experienced gross hematuria. He is on Eliquis. My suspicion is that this is going to be from the prostate since no other abnormality was noted previously., - 02/27/2019 Hydronephrosis (Stable), Right, He has mild right hydronephrosis noted on his CT scan that is unchanged and has been evaluated and noted to be nonobstructive in nature. - 02/27/2019 Microscopic hematuria, He was noted to have microscopic hematuria today. We therefore have discussed the need for further evaluation. He did not appear to have any infection but did have a few white cells so I am going to culture his urine. We will then obtain a creatinine and schedule him for a CT scan to evaluate the upper tract and will then have him return for lower tract evaluation with cystoscopy. - 2019 BPH w/o LUTS (Stable), He does have some slight prostatic enlargement on exam but does not have significant voiding symptoms. - 2018 BPH w/LUTS (Stable), He has some BPH by exam with nocturia 2 but he said it is not significant enough that he would want to consider any form of pharmacologic therapy at this time. - 2017, Benign prostatic hyperplasia with urinary obstruction, -  2016 Nocturia (Stable), His nocturia is not a significant bother to him. 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 He is s/p right RAL nephroureterectomy on 03/22/20.   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 He had no worrisome nodularity or induration noted on DRE but did have a mild rectal stricture. He underwent TRUS/BX on 01/18/12 at which time his prostate was noted to be 56 cc.  Pathology: He was found to have a single core positive for Gleason 3+3 = 6 adenocarcinoma involving 10% of that core.  Treatment: Active surveillance  Repeat TRUS/BX 07/24/17: Prostate volume - 57 cc  Pathology: 1 positive for Gleason 6 in 5% from  the left apex laterally.  Treatment: Continued active surveillance.   Surveillance:  Sep 2021: 12 core biopsy - Benign with chronic inflammation, Vol 53 cc   3) BPH with LUTS: He reported having some slowing of his urinary stream as well as nocturia.  Treatment: None currently   4) Left hydrocele: At one point he found it to be a bother but he says it really not giving him any trouble other than occasionally when he crosses his legs. It is not increasing in size.       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, CAD (coronary artery disease) - 2016 Personal history of other diseases of the circulatory system, History of angina pectoris - 2016 Personal history of other endocrine, nutritional and metabolic disease, History of type 2 diabetes mellitus - 2016 Stenosis of anus and rectum, Rectal stricture - 2016 Atrial Fibrillation Hypercholesterolemia Hypertension    FAMILY HISTORY: Family Health Status - Mother's Age - Runs In Family Family Health Status Number - Runs In Family Father Deceased At [place] ___ - Runs In Family Hypertension - Mother   SOCIAL HISTORY: Marital Status: Married Preferred Language: English; Race: White Has never drank.  Drinks 3 caffeinated drinks per day.     Notes: Former smoker, Occupation:, Marital History - Currently Married, Caffeine Use, Alcohol Use   REVIEW OF SYSTEMS:    GU Review Male:   Patient denies frequent urination, hard to postpone urination, burning/ pain with urination, get up at night to urinate, leakage of urine, stream starts and stops, trouble starting your streams, and have to strain to urinate .  Gastrointestinal (Lower):   Patient denies diarrhea and constipation.  Gastrointestinal (Upper):   Patient denies nausea and vomiting.  Constitutional:   Patient denies fever, night sweats, weight loss, and fatigue.  Skin:   Patient denies itching and skin rash/ lesion.  Eyes:   Patient denies blurred vision and double vision.  Ears/ Nose/ Throat:   Patient denies sore throat and sinus problems.  Hematologic/Lymphatic:   Patient denies swollen glands and easy bruising.  Cardiovascular:   Patient denies leg swelling and chest pains.  Respiratory:   Patient denies cough and shortness of breath.  Endocrine:   Patient denies excessive thirst.  Musculoskeletal:   Patient denies back pain and joint pain.  Neurological:   Patient denies headaches and dizziness.  Psychologic:   Patient denies depression and anxiety.   VITAL SIGNS:       01/19/2021 02:18 PM  Weight 216 lb / 97.98 kg  Height 72 in / 182.88 cm  BP 161/93 mmHg  Pulse 62 /min  Temperature 96.9 F / 36.0 C  BMI 29.3 kg/m   GU PHYSICAL EXAMINATION:    Urethral Meatus: Normal size. No lesion, no wart, no discharge, no polyp. Normal location.   MULTI-SYSTEM PHYSICAL EXAMINATION:    Constitutional: Well-nourished. No physical deformities. Normally developed. Good grooming.  Respiratory: No labored breathing, no use of accessory muscles. Clear bilaterally.  Cardiovascular: Normal temperature, normal extremity pulses, no swelling, no varicosities. Regular rate and rhythm.     Complexity of Data:  Records Review:   Previous Patient Records   06/15/20 10/21/19 02/27/19 01/08/18 07/11/17 06/07/17 07/19/16 12/14/15  PSA  Total PSA 8.69 ng/mL 2.39 ng/mL 3.27 ng/mL 1.67 ng/mL 4.28 ng/mL 10.80 ng/mL 1.92 ng/dl 1.68     PROCEDURES:  Flexible Cystoscopy - 52000  Indication: Urothelial carcinoma the bladder and right renal pelvis Risks, benefits, and potential complications of the procedure were discussed with the patient including infection, bleeding, voiding discomfort, urinary retention, fever, chills, sepsis, and others. All questions were answered. Informed consent was obtained. Sterile technique and intraurethral analgesia were used.  Meatus:  Normal size. Normal location. Normal condition.  Urethra:  No strictures.  External Sphincter:  Normal.  Verumontanum:  Normal.  Prostate:  Non-obstructing. No hyperplasia.  Bladder Neck:  Non-obstructing.  Ureteral Orifices:  the right ureteral orifice was absent. Left ureteral orifice was in its expected location. There was significant erythema along the trigone with an area that appeared to be intermittently bleeding. This area was raised and concerning for possible tumor recurrence. In addition, there are multiple erythematous areas along the posterior bladder that also raise concern for possible tumor recurrence  albeit non specifically. A bladder washing was obtained for cytology.  Bladder:  No trabeculation. No tumors. Normal mucosa. No stones.      Chaperone: SM The procedure was well-tolerated and without complications. Instructions were given to call the office immediately if questions or problems.         Urinalysis w/Scope - 81001 Dipstick Dipstick Cont'd Micro  Color: Yellow Bilirubin: Neg WBC/hpf: 0 - 5/hpf  Appearance: Slightly Cloudy Ketones: Neg RBC/hpf: 40 - 60/hpf  Specific Gravity: 1.025 Blood: 3+ Bacteria: NS (Not Seen)  pH: 5.5 Protein: 1+ Cystals: NS (Not Seen)  Glucose: Neg Urobilinogen: 0.2 Casts: NS (Not Seen)    Nitrites: Neg Trichomonas: Not Present    Leukocyte Esterase: Neg Mucous: Present      Epithelial Cells: 0 - 5/hpf      Yeast: NS (Not Seen)      Sperm: Not Present    Notes:      ASSESSMENT:      ICD-10 Details  1 GU:   Bladder Cancer overlapping sites - C67.8   2   Prostate Cancer - C61   3   Renal pelvis cancer, right - C65.1    PLAN:           Orders Labs Urine Cytology          Schedule Return Visit/Planned Activity: Other See Visit Notes             Note: Will call to schedule surgery          Document Letter(s):  Created for Patient: Clinical Summary         Notes:   1. Urothelial carcinoma of the right renal pelvis /ureter and bladder: We discussed his cystoscopic findings today that do raise concern for possible recurrence. I have recommended proceeding with operative intervention including cystoscopy, left retrograde pyelography, and biopsy/ transurethral resection of his abnormal mucosal areas noted on cystoscopy today. A bladder washing was obtained for cytology.   2. Low risk prostate cancer: We will plan to check a PSA in the near future and DRE for ongoing surveillance.   3. Chronic kidney disease: Continue follow-up with Nephrology.   Cc: Dr. Letta Median    * Signed by [name], M.D. on 01/20/21 at 6:26 PM (EST)*

## 2021-02-10 ENCOUNTER — Encounter (HOSPITAL_COMMUNITY): Payer: Self-pay | Admitting: Urology

## 2021-02-10 ENCOUNTER — Ambulatory Visit (HOSPITAL_COMMUNITY)
Admission: RE | Admit: 2021-02-10 | Discharge: 2021-02-10 | Disposition: A | Discharge status: Home / Self Care | Payer: Medicare Other | Attending: Urology | Admitting: Urology

## 2021-02-10 ENCOUNTER — Ambulatory Visit (HOSPITAL_COMMUNITY): Payer: Medicare Other

## 2021-02-10 ENCOUNTER — Ambulatory Visit (HOSPITAL_COMMUNITY): Payer: Medicare Other | Admitting: Physician Assistant

## 2021-02-10 ENCOUNTER — Ambulatory Visit (HOSPITAL_COMMUNITY): Payer: Medicare Other | Admitting: Certified Registered Nurse Anesthetist

## 2021-02-10 ENCOUNTER — Encounter (HOSPITAL_COMMUNITY)
Admission: RE | Disposition: A | Discharge status: Home / Self Care | Payer: Self-pay | Source: Home / Self Care | Attending: Urology

## 2021-02-10 DIAGNOSIS — N3289 Other specified disorders of bladder: Secondary | ICD-10-CM | POA: Diagnosis not present

## 2021-02-10 DIAGNOSIS — E7800 Pure hypercholesterolemia, unspecified: Secondary | ICD-10-CM | POA: Diagnosis not present

## 2021-02-10 DIAGNOSIS — I130 Hypertensive heart and chronic kidney disease with heart failure and stage 1 through stage 4 chronic kidney disease, or unspecified chronic kidney disease: Secondary | ICD-10-CM | POA: Diagnosis not present

## 2021-02-10 DIAGNOSIS — E1122 Type 2 diabetes mellitus with diabetic chronic kidney disease: Secondary | ICD-10-CM | POA: Insufficient documentation

## 2021-02-10 DIAGNOSIS — I5032 Chronic diastolic (congestive) heart failure: Secondary | ICD-10-CM | POA: Diagnosis not present

## 2021-02-10 DIAGNOSIS — C641 Malignant neoplasm of right kidney, except renal pelvis: Secondary | ICD-10-CM | POA: Diagnosis not present

## 2021-02-10 DIAGNOSIS — N189 Chronic kidney disease, unspecified: Secondary | ICD-10-CM | POA: Insufficient documentation

## 2021-02-10 DIAGNOSIS — I4891 Unspecified atrial fibrillation: Secondary | ICD-10-CM | POA: Insufficient documentation

## 2021-02-10 DIAGNOSIS — Z87891 Personal history of nicotine dependence: Secondary | ICD-10-CM | POA: Diagnosis not present

## 2021-02-10 DIAGNOSIS — C679 Malignant neoplasm of bladder, unspecified: Secondary | ICD-10-CM | POA: Insufficient documentation

## 2021-02-10 DIAGNOSIS — C678 Malignant neoplasm of overlapping sites of bladder: Secondary | ICD-10-CM | POA: Diagnosis not present

## 2021-02-10 DIAGNOSIS — I2510 Atherosclerotic heart disease of native coronary artery without angina pectoris: Secondary | ICD-10-CM | POA: Insufficient documentation

## 2021-02-10 DIAGNOSIS — N3020 Other chronic cystitis without hematuria: Secondary | ICD-10-CM | POA: Diagnosis not present

## 2021-02-10 DIAGNOSIS — Z8249 Family history of ischemic heart disease and other diseases of the circulatory system: Secondary | ICD-10-CM | POA: Diagnosis not present

## 2021-02-10 DIAGNOSIS — N3030 Trigonitis without hematuria: Secondary | ICD-10-CM | POA: Insufficient documentation

## 2021-02-10 DIAGNOSIS — C651 Malignant neoplasm of right renal pelvis: Secondary | ICD-10-CM | POA: Diagnosis not present

## 2021-02-10 DIAGNOSIS — C61 Malignant neoplasm of prostate: Secondary | ICD-10-CM | POA: Insufficient documentation

## 2021-02-10 DIAGNOSIS — I129 Hypertensive chronic kidney disease with stage 1 through stage 4 chronic kidney disease, or unspecified chronic kidney disease: Secondary | ICD-10-CM | POA: Diagnosis not present

## 2021-02-10 HISTORY — PX: TRANSURETHRAL RESECTION OF BLADDER TUMOR: SHX2575

## 2021-02-10 LAB — GLUCOSE, CAPILLARY
Glucose-Capillary: 110 mg/dL — ABNORMAL HIGH (ref 70–99)
Glucose-Capillary: 128 mg/dL — ABNORMAL HIGH (ref 70–99)

## 2021-02-10 SURGERY — TURBT (TRANSURETHRAL RESECTION OF BLADDER TUMOR)
Anesthesia: General

## 2021-02-10 MED ORDER — PROPOFOL 10 MG/ML IV BOLUS
INTRAVENOUS | Status: AC
  Filled 2021-02-10: qty 20

## 2021-02-10 MED ORDER — LIDOCAINE 2% (20 MG/ML) 5 ML SYRINGE
INTRAMUSCULAR | Status: AC
  Filled 2021-02-10: qty 5

## 2021-02-10 MED ORDER — TRAMADOL HCL 50 MG PO TABS
50.0000 mg | ORAL_TABLET | Freq: Once | ORAL | Status: AC
  Administered 2021-02-10: 100 mg via ORAL

## 2021-02-10 MED ORDER — TRAMADOL HCL 50 MG PO TABS: 50.0000 mg | ORAL_TABLET | Freq: Four times a day (QID) | ORAL | 0 refills | Status: DC | PRN

## 2021-02-10 MED ORDER — DEXAMETHASONE SODIUM PHOSPHATE 4 MG/ML IJ SOLN
INTRAMUSCULAR | Status: DC | PRN
  Administered 2021-02-10: 4 mg via INTRAVENOUS

## 2021-02-10 MED ORDER — SODIUM CHLORIDE 0.9 % IR SOLN
Status: DC | PRN
  Administered 2021-02-10: 3000 mL

## 2021-02-10 MED ORDER — CEFAZOLIN SODIUM-DEXTROSE 2-4 GM/100ML-% IV SOLN
2.0000 g | Freq: Once | INTRAVENOUS | Status: AC
  Administered 2021-02-10: 2 g via INTRAVENOUS

## 2021-02-10 MED ORDER — TRAMADOL HCL 50 MG PO TABS
ORAL_TABLET | ORAL | Status: AC
  Filled 2021-02-10: qty 2

## 2021-02-10 MED ORDER — ROCURONIUM BROMIDE 100 MG/10ML IV SOLN
INTRAVENOUS | Status: DC | PRN
  Administered 2021-02-10: 50 mg via INTRAVENOUS

## 2021-02-10 MED ORDER — FENTANYL CITRATE (PF) 100 MCG/2ML IJ SOLN
INTRAMUSCULAR | Status: AC
  Filled 2021-02-10: qty 2

## 2021-02-10 MED ORDER — PROPOFOL 10 MG/ML IV BOLUS
INTRAVENOUS | Status: DC | PRN
  Administered 2021-02-10: 150 mg via INTRAVENOUS

## 2021-02-10 MED ORDER — PHENYLEPHRINE 40 MCG/ML (10ML) SYRINGE FOR IV PUSH (FOR BLOOD PRESSURE SUPPORT)
PREFILLED_SYRINGE | INTRAVENOUS | Status: AC
  Filled 2021-02-10: qty 10

## 2021-02-10 MED ORDER — ONDANSETRON HCL 4 MG/2ML IJ SOLN
INTRAMUSCULAR | Status: DC | PRN
  Administered 2021-02-10: 4 mg via INTRAVENOUS

## 2021-02-10 MED ORDER — ROCURONIUM BROMIDE 10 MG/ML (PF) SYRINGE
PREFILLED_SYRINGE | INTRAVENOUS | Status: AC
  Filled 2021-02-10: qty 10

## 2021-02-10 MED ORDER — ACETAMINOPHEN 500 MG PO TABS: 1000.0000 mg | ORAL_TABLET | Freq: Once | ORAL | Status: DC

## 2021-02-10 MED ORDER — FENTANYL CITRATE (PF) 100 MCG/2ML IJ SOLN
INTRAMUSCULAR | Status: DC | PRN
  Administered 2021-02-10: 50 ug via INTRAVENOUS

## 2021-02-10 MED ORDER — PROMETHAZINE HCL 25 MG/ML IJ SOLN: 6.2500 mg | INTRAMUSCULAR | Status: DC | PRN

## 2021-02-10 MED ORDER — IOHEXOL 300 MG/ML  SOLN
INTRAMUSCULAR | Status: DC | PRN
  Administered 2021-02-10: 8 mL via URETHRAL

## 2021-02-10 MED ORDER — ONDANSETRON HCL 4 MG/2ML IJ SOLN
INTRAMUSCULAR | Status: AC
  Filled 2021-02-10: qty 2

## 2021-02-10 MED ORDER — FENTANYL CITRATE (PF) 100 MCG/2ML IJ SOLN: 25.0000 ug | INTRAMUSCULAR | Status: DC | PRN

## 2021-02-10 MED ORDER — LIDOCAINE HCL (CARDIAC) PF 100 MG/5ML IV SOSY
PREFILLED_SYRINGE | INTRAVENOUS | Status: DC | PRN
  Administered 2021-02-10: 80 mg via INTRAVENOUS

## 2021-02-10 MED ORDER — CHLORHEXIDINE GLUCONATE 0.12 % MT SOLN
15.0000 mL | Freq: Once | OROMUCOSAL | Status: AC
  Administered 2021-02-10: 15 mL via OROMUCOSAL

## 2021-02-10 MED ORDER — ORAL CARE MOUTH RINSE: 15.0000 mL | Freq: Once | OROMUCOSAL | Status: AC

## 2021-02-10 MED ORDER — DEXAMETHASONE SODIUM PHOSPHATE 10 MG/ML IJ SOLN
INTRAMUSCULAR | Status: AC
  Filled 2021-02-10: qty 1

## 2021-02-10 MED ORDER — CEFAZOLIN SODIUM-DEXTROSE 2-4 GM/100ML-% IV SOLN
INTRAVENOUS | Status: AC
  Filled 2021-02-10: qty 100

## 2021-02-10 MED ORDER — SUGAMMADEX SODIUM 200 MG/2ML IV SOLN
INTRAVENOUS | Status: DC | PRN
  Administered 2021-02-10: 396 mg via INTRAVENOUS

## 2021-02-10 MED ORDER — LACTATED RINGERS IV SOLN: INTRAVENOUS | Status: DC

## 2021-02-10 MED ORDER — PHENYLEPHRINE HCL (PRESSORS) 10 MG/ML IV SOLN
INTRAVENOUS | Status: DC | PRN
  Administered 2021-02-10: 80 ug via INTRAVENOUS
  Administered 2021-02-10: 120 ug via INTRAVENOUS
  Administered 2021-02-10 (×2): 80 ug via INTRAVENOUS

## 2021-02-10 MED ORDER — SUGAMMADEX SODIUM 500 MG/5ML IV SOLN
INTRAVENOUS | Status: AC
  Filled 2021-02-10: qty 5

## 2021-02-10 SURGICAL SUPPLY — 15 items
BAG URINE DRAIN 2000ML AR STRL (UROLOGICAL SUPPLIES) IMPLANT
BAG URO CATCHER STRL LF (MISCELLANEOUS) ×2 IMPLANT
DRAPE FOOT SWITCH (DRAPES) ×2 IMPLANT
ELECT REM PT RETURN 15FT ADLT (MISCELLANEOUS) IMPLANT
GLOVE SURG ENC TEXT LTX SZ7.5 (GLOVE) ×2 IMPLANT
GOWN STRL REUS W/TWL LRG LVL3 (GOWN DISPOSABLE) ×2 IMPLANT
KIT TURNOVER KIT A (KITS) ×2 IMPLANT
LOOP CUT BIPOLAR 24F LRG (ELECTROSURGICAL) ×2 IMPLANT
MANIFOLD NEPTUNE II (INSTRUMENTS) ×2 IMPLANT
PACK CYSTO (CUSTOM PROCEDURE TRAY) ×2 IMPLANT
PENCIL SMOKE EVACUATOR (MISCELLANEOUS) IMPLANT
SYR TOOMEY IRRIG 70ML (MISCELLANEOUS) ×2
SYRINGE TOOMEY IRRIG 70ML (MISCELLANEOUS) ×1 IMPLANT
TUBING CONNECTING 10 (TUBING) ×2 IMPLANT
TUBING UROLOGY SET (TUBING) ×2 IMPLANT

## 2021-02-10 NOTE — Discharge Instructions (Addendum)
1. You may see some blood in the urine and may have some burning with urination for 48-72 hours. You also may notice that you have to urinate more frequently or urgently after your procedure which is normal.  2. You should call should you develop an inability urinate, fever > 101, persistent nausea and vomiting that prevents you from eating or drinking to stay hydrated.        3.   You may resume your Eliquis in 72 hrs if no blood in the urine at that time.

## 2021-02-10 NOTE — Anesthesia Postprocedure Evaluation (Signed)
Anesthesia Post Note  Patient: Adam Harris  Procedure(s) Performed: TRANSURETHRAL RESECTION OF BLADDER TUMOR (TURBT)/ CYSTOSCOPY/ LEFT RETROGRADE (N/A )     Patient location during evaluation: PACU Anesthesia Type: General Level of consciousness: awake and alert Pain management: pain level controlled Vital Signs Assessment: post-procedure vital signs reviewed and stable Respiratory status: spontaneous breathing, nonlabored ventilation, respiratory function stable and patient connected to nasal cannula oxygen Cardiovascular status: blood pressure returned to baseline and stable Postop Assessment: no apparent nausea or vomiting Anesthetic complications: no   No complications documented.  Last Vitals:  Vitals:   02/10/21 1156 02/10/21 1200  BP: (!) 144/94   Pulse: 74   Resp:    Temp: 36.6 C   SpO2: 99% 99%    Last Pain:  Vitals:   02/10/21 1159  TempSrc:   PainSc: 4                  Tiajuana Amass

## 2021-02-10 NOTE — Op Note (Signed)
Preoperative diagnosis: Urothelial carcinoma of the right kidney/ureter and bladder  Postoperative diagnosis: Urothelial carcinoma of the right kidney/ureter and bladder  Procedures: 1.  Cystoscopy 2.  Pelvic exam under anesthesia 3.  Left retrograde pyelography with interpretation 4.  Transurethral resection of bladder (2 cm)  Surgeon: Adam Curia MD  Anesthesia: General  Complications: None  EBL: Minimal  Specimens: 1.  Bladder tumor from trigone 2.  Bladder tumor from anterior bladder  Disposition of specimens: Pathology  Intraoperative findings: Left retrograde pyelography was performed with a 6 French ureteral catheter and Omnipaque contrast.  This revealed a normal caliber ureter without filling defects.  No hydronephrosis was noted.  No renal collecting system filling defects noted.  Indication: Adam Harris is a 75 year old gentleman with a history of urothelial carcinoma and carcinoma in situ of the bladder and right upper urinary tract.  He is status post right nephro ureterectomy.  Due to his persistent carcinoma in situ the bladder following his right renal surgery, he underwent a repeat induction course of BCG.  On cystoscopic surveillance, he was noted to have multiple erythematous sites concerning for possible persistent carcinoma in situ.  Urine cytology was suspicious.  He presents today for further evaluation and the above procedures.  The potential risks, complications, and the expected recovery process was discussed in detail.  Informed consent was obtained.  Description of procedure: The patient was taken to the operating room and a general anesthetic was administered.  He was given preoperative antibiotics, placed in the dorsolithotomy position, and prepped and draped in the usual sterile fashion.  Next, a preoperative timeout was performed.  Cystourethroscopy was performed with both a 30 degree and 70 degree lens.  This revealed an absent right ureter  consistent with a surgical history.  The left ureteral orifice was in its expected anatomic location and was effluxing clear urine.  There were multiple raised erythematous areas with the most obvious area at the trigone of the bladder extending toward the right hemitrigone measuring approximately 2 cm in largest diameter.  There was also an erythematous area that appeared suspicious toward the dome of the bladder and a another area at the anterior bladder.  These were smaller than 2 cm each.  I remove the cystoscope and attempted to place the 26 French resectoscope.  However, the patient's urethra would not accept the resectoscope.  He was therefore serially dilated with Adam Harris sounds up to 30 [place].  I then placed the resectoscope and using loop bipolar resection, resected the entire area of raised erythematous tissue toward the trigone and right hemitrigone.  This was sent as a single specimen.  The abnormal areas toward the dome of the bladder were then examined.  These appeared in a very difficult position to perform resection and there was concerned that he might be at risk for bladder perforation.  As such, these areas were fulgurated.  I then turned to the anterior bladder where that abnormal tissue was resected for adequate sampling and sent as a separate specimen.  Hemostasis was achieved with bipolar cautery.  The bladder was then reinspected and there was excellent hemostasis.  The patient was awakened and able to be transferred to the recovery unit in satisfactory condition.

## 2021-02-10 NOTE — Interval H&P Note (Signed)
History and Physical Interval Note:  02/10/2021 9:37 AM  Adam Harris  has presented today for surgery, with the diagnosis of BLADDER CANCER.  The various methods of treatment have been discussed with the patient and family. After consideration of risks, benefits and other options for treatment, the patient has consented to  Procedure(s) with comments: TRANSURETHRAL RESECTION OF BLADDER TUMOR (TURBT)/ CYSTOSCOPY/ LEFT RETROGRADE (N/A) - GENERAL ANESTHESIA WITH PARALYSIS as a surgical intervention.  The patient's history has been reviewed, patient examined, no change in status, stable for surgery.  I have reviewed the patient's chart and labs.  Questions were answered to the patient's satisfaction.     [employer]

## 2021-02-10 NOTE — Anesthesia Procedure Notes (Signed)
Procedure Name: Intubation Date/Time: 02/10/2021 10:11 AM Performed by: Deliah Boston, CRNA Pre-anesthesia Checklist: Patient identified, Emergency Drugs available, Suction available and Patient being monitored Patient Re-evaluated:Patient Re-evaluated prior to induction Oxygen Delivery Method: Circle system utilized Preoxygenation: Pre-oxygenation with 100% oxygen Induction Type: IV induction Ventilation: Mask ventilation without difficulty Laryngoscope Size: Mac and 4 Grade View: Grade I Tube type: Oral Tube size: 7.5 mm Number of attempts: 1 Airway Equipment and Method: Stylet and Oral airway Placement Confirmation: ETT inserted through vocal cords under direct vision,  positive ETCO2 and breath sounds checked- equal and bilateral Secured at: 22 cm Tube secured with: Tape Dental Injury: Teeth and Oropharynx as per pre-operative assessment

## 2021-02-10 NOTE — Transfer of Care (Signed)
Immediate Anesthesia Transfer of Care Note  Patient: Adam Harris  Procedure(s) Performed: Procedure(s) with comments: TRANSURETHRAL RESECTION OF BLADDER TUMOR (TURBT)/ CYSTOSCOPY/ LEFT RETROGRADE (N/A) - GENERAL ANESTHESIA WITH PARALYSIS  Patient Location: PACU  Anesthesia Type:General  Level of Consciousness: Patient easily awoken, sedated, comfortable, cooperative, following commands, responds to stimulation.   Airway & Oxygen Therapy: Patient spontaneously breathing, ventilating well, oxygen via simple oxygen mask.  Post-op Assessment: Report given to PACU RN, vital signs reviewed and stable, moving all extremities.   Post vital signs: Reviewed and stable.  Complications: No apparent anesthesia complications Last Vitals:  Vitals Value Taken Time  BP 130/80 02/10/21 1102  Temp    Pulse 66 02/10/21 1103  Resp 24 02/10/21 1103  SpO2 98 % 02/10/21 1103  Vitals shown include unvalidated device data.  Last Pain:  Vitals:   02/10/21 0930  TempSrc: Oral      Patients Stated Pain Goal: 4 (13/14/38 8875)  Complications: No complications documented.

## 2021-02-11 ENCOUNTER — Encounter (HOSPITAL_COMMUNITY): Payer: Self-pay | Admitting: Urology

## 2021-02-13 LAB — SURGICAL PATHOLOGY

## 2021-02-23 DIAGNOSIS — C678 Malignant neoplasm of overlapping sites of bladder: Secondary | ICD-10-CM | POA: Diagnosis not present

## 2021-02-23 DIAGNOSIS — C61 Malignant neoplasm of prostate: Secondary | ICD-10-CM | POA: Diagnosis not present

## 2021-02-23 DIAGNOSIS — R8271 Bacteriuria: Secondary | ICD-10-CM | POA: Diagnosis not present

## 2021-02-23 DIAGNOSIS — C651 Malignant neoplasm of right renal pelvis: Secondary | ICD-10-CM | POA: Diagnosis not present

## 2021-03-27 IMAGING — CR DG KNEE COMPLETE 4+V*R*
4 series · 4 of 4 positions shown · non-contrast
Comparison: 3404

CLINICAL DATA: Right knee pain

EXAM:
RIGHT KNEE - COMPLETE 4+ VIEW

[t knee ap right]
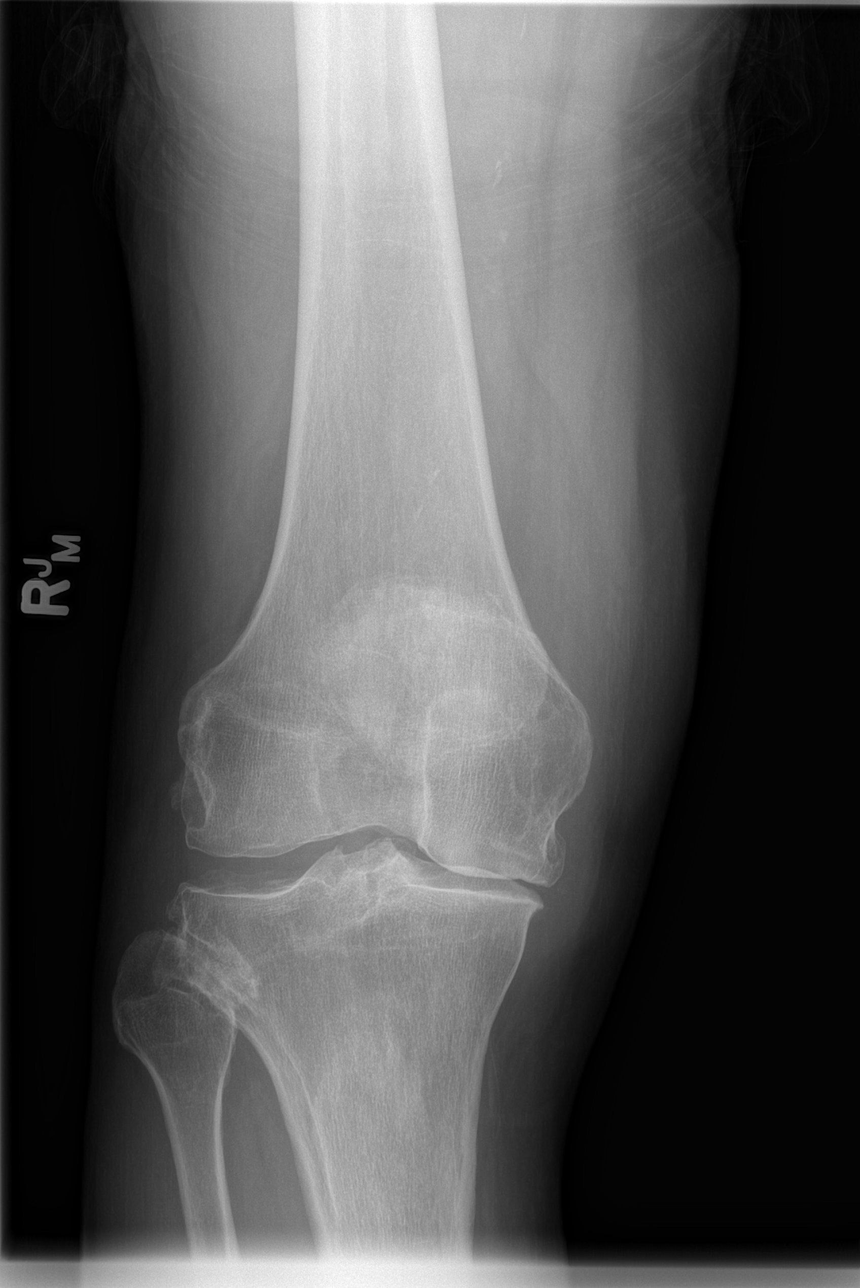

[t knee oblique right (1 of 2)]
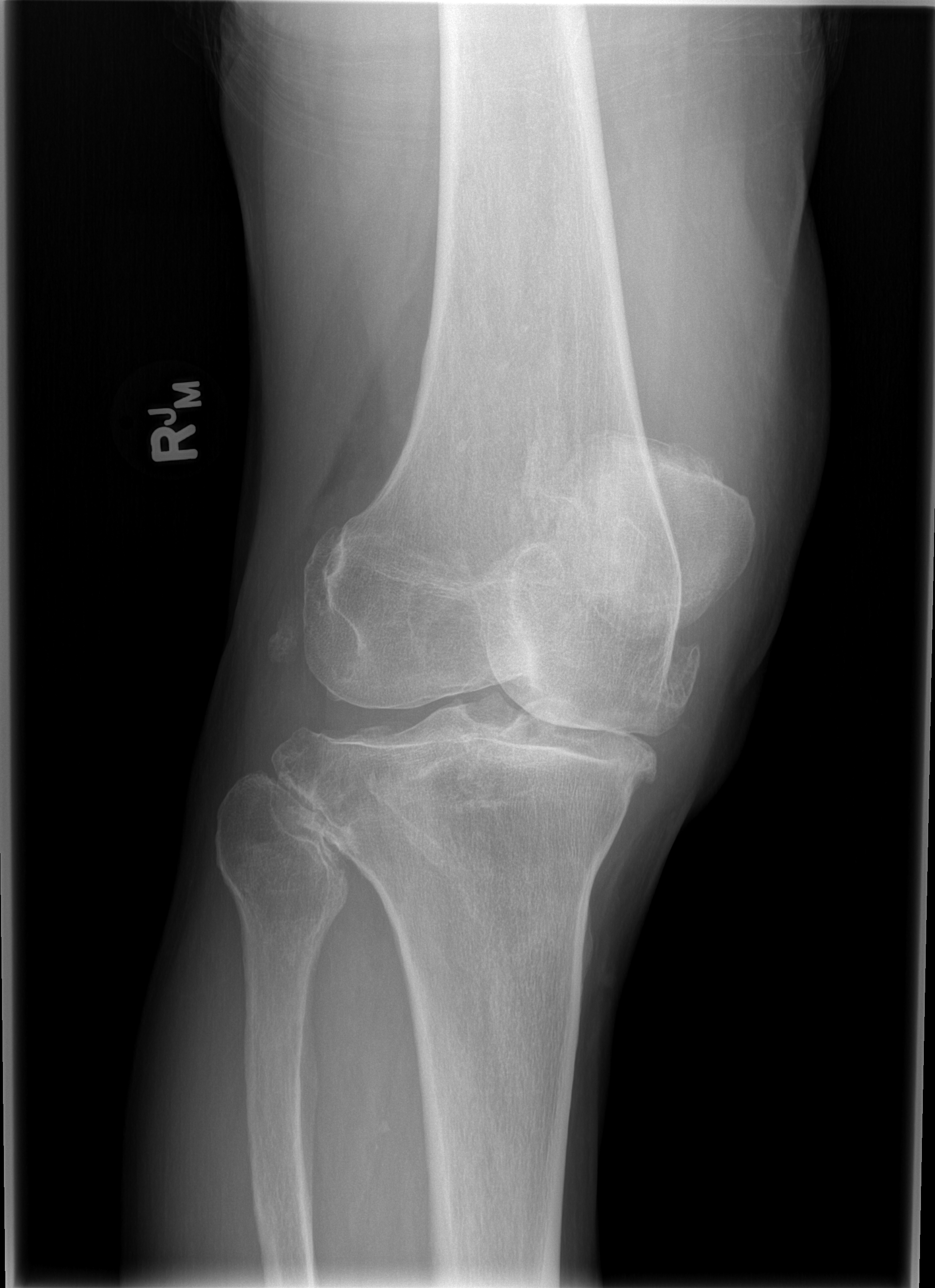

[t knee oblique right (2 of 2)]
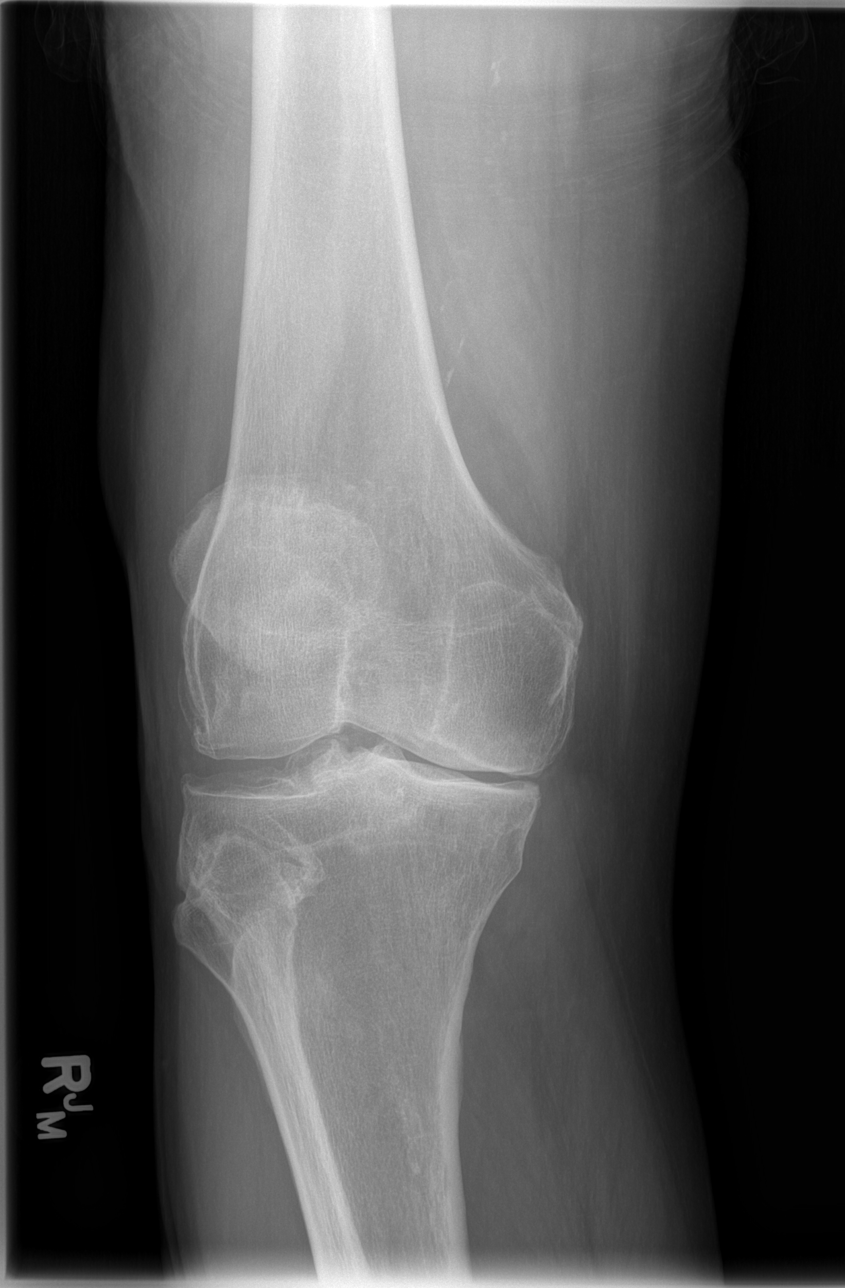

[t knee lat right]
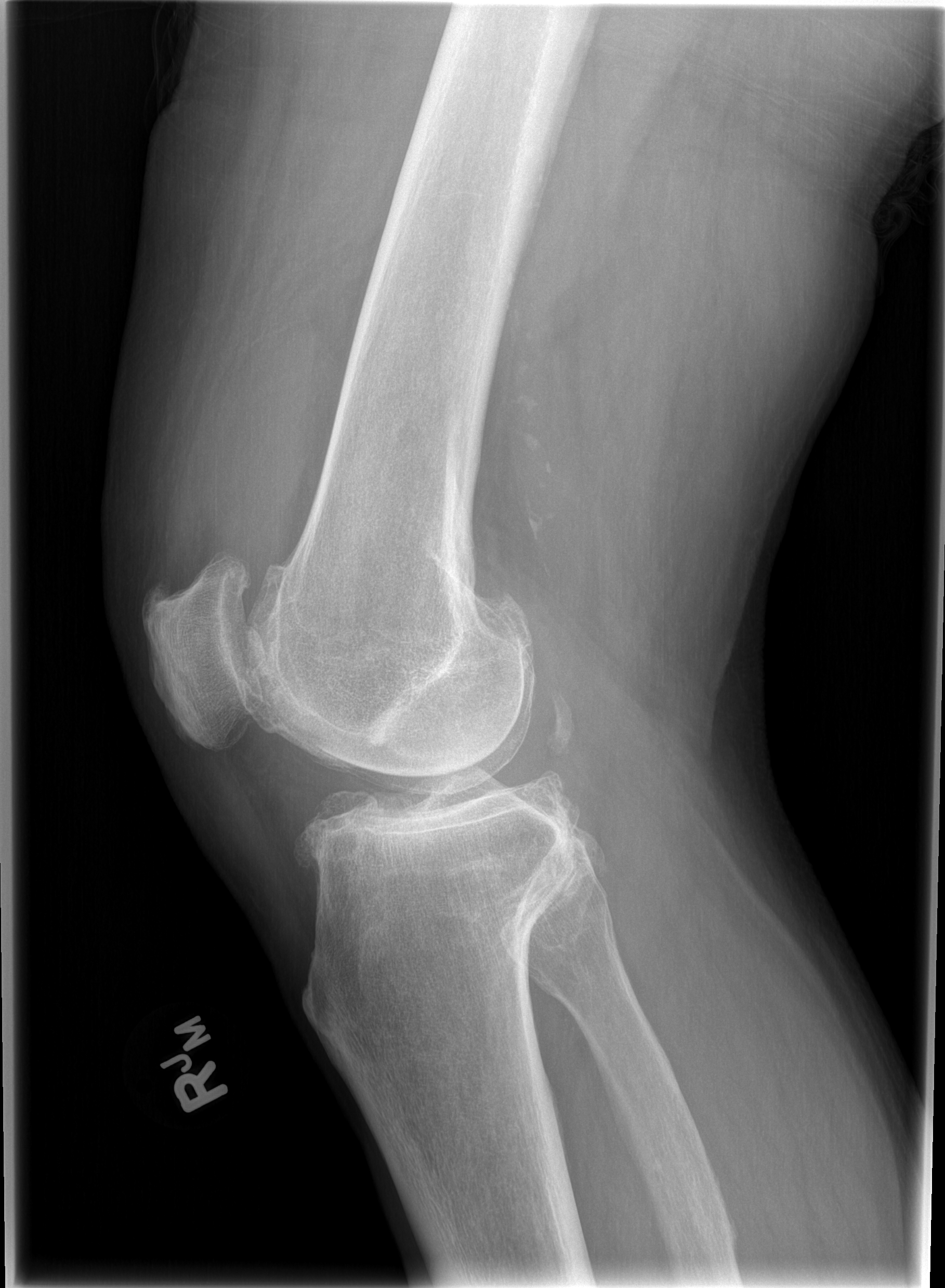

[4 of 4 positions shown; findings below may reference images not displayed]

FINDINGS: Alignment is anatomic. Joint effusion is present. There are
tricompartmental changes of osteoarthritis my greatest in the medial
and patellofemoral compartments. Medial compartment joint space
narrowing is similar to the prior study. Vascular calcification is
present.
IMPRESSION: Tricompartmental osteoarthritis.

## 2021-03-29 DIAGNOSIS — Z5111 Encounter for antineoplastic chemotherapy: Secondary | ICD-10-CM | POA: Diagnosis not present

## 2021-03-29 DIAGNOSIS — E1122 Type 2 diabetes mellitus with diabetic chronic kidney disease: Secondary | ICD-10-CM | POA: Diagnosis not present

## 2021-03-29 DIAGNOSIS — C678 Malignant neoplasm of overlapping sites of bladder: Secondary | ICD-10-CM | POA: Diagnosis not present

## 2021-03-29 DIAGNOSIS — R8271 Bacteriuria: Secondary | ICD-10-CM | POA: Diagnosis not present

## 2021-03-29 DIAGNOSIS — Z905 Acquired absence of kidney: Secondary | ICD-10-CM | POA: Diagnosis not present

## 2021-03-29 DIAGNOSIS — N184 Chronic kidney disease, stage 4 (severe): Secondary | ICD-10-CM | POA: Diagnosis not present

## 2021-03-29 DIAGNOSIS — I129 Hypertensive chronic kidney disease with stage 1 through stage 4 chronic kidney disease, or unspecified chronic kidney disease: Secondary | ICD-10-CM | POA: Diagnosis not present

## 2021-04-06 ENCOUNTER — Telehealth: Payer: Self-pay | Admitting: *Deleted

## 2021-04-06 ENCOUNTER — Ambulatory Visit (INDEPENDENT_AMBULATORY_CARE_PROVIDER_SITE_OTHER): Payer: Medicare Other | Admitting: Gastroenterology

## 2021-04-06 ENCOUNTER — Encounter: Payer: Self-pay | Admitting: Gastroenterology

## 2021-04-06 VITALS — BP 120/66 | HR 76 | Ht 71.0 in | Wt 214.6 lb

## 2021-04-06 DIAGNOSIS — Z7901 Long term (current) use of anticoagulants: Secondary | ICD-10-CM

## 2021-04-06 DIAGNOSIS — Z1211 Encounter for screening for malignant neoplasm of colon: Secondary | ICD-10-CM | POA: Diagnosis not present

## 2021-04-06 DIAGNOSIS — Z01818 Encounter for other preprocedural examination: Secondary | ICD-10-CM

## 2021-04-06 MED ORDER — CLENPIQ 10-3.5-12 MG-GM -GM/160ML PO SOLN: 1.0000 | Freq: Once | ORAL | 0 refills | Status: AC

## 2021-04-06 NOTE — Telephone Encounter (Signed)
Patient with diagnosis of afib on Eliquis for anticoagulation.    Procedure: colonoscopy Date of procedure: 04/22/21  CHA2DS2-VASc Score = 5  This indicates a 7.2% annual risk of stroke. The patient's score is based upon: CHF History: Yes HTN History: Yes Diabetes History: Yes Stroke History: No Vascular Disease History: Yes Age Score: 1 Gender Score: 0     CrCl 30 ml/min Platelet count 143  Per office protocol, patient can hold Eliquis for 2 days prior to procedure.

## 2021-04-06 NOTE — Patient Instructions (Signed)
If you are age 74 or older, your body mass index should be between 23-30. Your Body mass index is 29.93 kg/m. If this is out of the aforementioned range listed, please consider follow up with your Primary Care Provider.  If you are age 35 or younger, your body mass index should be between 19-25. Your Body mass index is 29.93 kg/m. If this is out of the aformentioned range listed, please consider follow up with your Primary Care Provider.   You have been scheduled for a colonoscopy. Please follow written instructions given to you at your visit today.  Please pick up your prep supplies at the pharmacy within the next 1-3 days. If you use inhalers (even only as needed), please bring them with you on the day of your procedure.  Due to recent changes in healthcare laws, you may see the results of your imaging and laboratory studies on MyChart before your provider has had a chance to review them.  We understand that in some cases there may be results that are confusing or concerning to you. Not all laboratory results come back in the same time frame and the provider may be waiting for multiple results in order to interpret others.  Please give [place] 48 hours in order for your provider to thoroughly review all the results before contacting the office for clarification of your results.   The [facility] providers would like to encourage you to use [facility] to communicate with providers for non-urgent requests or questions.  Due to long hold times on the telephone, sending your provider a message by [facility] may be a faster and more efficient way to get a response.  Please allow 48 business hours for a response.  Please remember that this is for non-urgent requests.

## 2021-04-06 NOTE — Telephone Encounter (Signed)
Will route to PharmD for rec's re: holding anticoagulation. Richardson Dopp, PA-C    04/06/2021 9:50 AM

## 2021-04-06 NOTE — Telephone Encounter (Signed)
Higher risk of stroke off anticoagulation for 4 days with chronic atrial fibrillation.  Will route to PharmD and Dr. Percival Spanish for rec's re: holding anticoagulation. Richardson Dopp, PA-C    04/06/2021 4:48 PM

## 2021-04-06 NOTE — Telephone Encounter (Signed)
Okay to hold Eliquis for the procedure.  Only needs to hold for 2 days.

## 2021-04-06 NOTE — Telephone Encounter (Signed)
[facility] Pre-operative Risk Assessment     Request for surgical clearance:     Endoscopy Procedure  What type of surgery is being performed?     colonoscopy  When is this surgery scheduled?     Friday 04/22/21  What type of clearance is required ?   Pharmacy  Are there any medications that need to be held prior to surgery and how long? Eliquis 2 days  Practice name and name of physician performing surgery?      [facility]  What is your office phone and fax number?      Phone- [phone]  [phone]  Anesthesia type (None, local, MAC, general) ?       MAC

## 2021-04-06 NOTE — Progress Notes (Addendum)
04/06/2021 Adam Harris [id number] Sep 15, 1946   HISTORY OF PRESENT ILLNESS: This is a 75 year old male who is new to our office.  He has been referred here by his PCP, Dr. Bryan Lemma, in order to discuss colonoscopy.  He has never had colonoscopy in the past.  He denies any GI complaints.  Says that he moves his bowels well, does not see blood in his stool.  He is on Eliquis for atrial fibrillation.  He just held his Eliquis at the end of March for bladder biopsy and had seen Dr. Percival Spanish, his cardiologist, prior to that and he deemed him not high risk for procedure.  He said he has not had any issues with holding Eliquis.   Past Medical History:  Diagnosis Date  . Abnormal radiologic findings on diagnostic imaging of renal pelvis, ureter, or bladder    bilateral ureter abnormalities  . Anticoagulant long-term use    eliquis  . Anxiety   . Arthritis   . CAD (coronary artery disease) cardiologist-- dr hochrein   NSTEMI 02-04-2014  per cardiac cath chronic occluded RCA w/ faint left-to-right collaterals and aneurysmal LCFx with sluggish coronary flow/   NSTEMI --11-21-2016 per cardiac cath occluded proximal RCA & mid to diastal CFX 100%, med rx. If that does not work, PTCA or CABG  . Cancer ([place])    bladder and kidney  . Chronic diastolic heart failure ([place])   . CKD (chronic kidney disease), stage III [facility])    patient unaware  . DOE (dyspnea on exertion)    2-3 flights of stairs  . Dyspnea    With exertion  . Fatty liver    pt denies  . Hematuria 02/2019  . History of COVID-19 10/2019  . History of non-ST elevation myocardial infarction (NSTEMI)    02-04-2014  and 11-21-2016  cardiac cath done both times ,  medically management  . History of shingles 12/2017   slight pain and numbness still noted in the area  . Hyperlipidemia   . Hypertension   . Insomnia   . Myocardial infarction ([place]) 2015  . Persistent atrial fibrillation [facility])    cardiologsit-- dr Percival Spanish  . Sciatica    . Thoracic aortic atherosclerosis ([place])   . Type 2 diabetes mellitus ([place])   . Urinary frequency    Past Surgical History:  Procedure Laterality Date  . CARDIAC CATHETERIZATION N/A 11/21/2016   Procedure: Left Heart Cath and Coronary Angiography;  Surgeon: Troy Sine, MD;  Location: [place] CV LAB;  Service: Cardiovascular;  Laterality: N/A;  pRCA 100% , ostial LAD 45%, OM3 80%, mCFX to dCFX 100% (AV groove), lateral OM3 50%  . COLONOSCOPY    . CYSTOSCOPY WITH BIOPSY N/A 08/12/2020   Procedure: CYSTOSCOPY WITH BLADDER BIOPSY AND TRANSURETHRAL RESECTION OF BLADDER TUMOR;  Surgeon: [name], MD;  Location: [facility];  Service: Urology;  Laterality: N/A;  . CYSTOSCOPY WITH URETEROSCOPY AND STENT PLACEMENT Right 12/15/2019   Procedure: CYSTOSCOPY WITH RIGHT RETROGRADE/ RIGHT URETEROSCOPY/ BIOPSY;  Surgeon: [name], MD;  Location: [place];  Service: Urology;  Laterality: Right;  . CYSTOSCOPY/RETROGRADE/URETEROSCOPY Bilateral 03/18/2018   Procedure: CYSTOSCOPY/RETROGRADE/URETEROSCOPY.;  Surgeon: [name], MD;  [facility];  Service: Urology;  Laterality: Bilateral;  . KIDNEY SURGERY    . KNEE ARTHROSCOPY Right   . LEFT HEART CATHETERIZATION WITH CORONARY ANGIOGRAM N/A 02/04/2014   Procedure: LEFT HEART CATHETERIZATION WITH CORONARY ANGIOGRAM;  Surgeon: [name], MD;  Location: [facility] CATH  LAB;  Service: Cardiovascular;  Laterality: N/A;  severe one-vessel CAD, chronically occluded RCA with faint left-to-right collaterals;  aneurysmal LCFx with sluggish coronary flow;  normal LVSF w/ moderately elevated LVEDP (ostialOM2 20%, pOM3 20%, pD1 20%, mCFX 50%, diffuse 20% pCFX)  . PROSTATE BIOPSY N/A 08/12/2020   Procedure: BIOPSY TRANSRECTAL ULTRASONIC PROSTATE (TUBP);  Surgeon: [name], MD;  Location[facility];  Service: Urology;  Laterality: N/A;  . ROBOT ASSITED LAPAROSCOPIC NEPHROURETERECTOMY Right 03/22/2020   Procedure: XI ROBOT  ASSITED LAPAROSCOPIC NEPHROURETERECTOMY;  Surgeon: [name], MD;  Location: [facility];  Service: Urology;  Laterality: Right;  . TRANSTHORACIC ECHOCARDIOGRAM  11-22-2016   dr hochrein   ef 55-60%/ mild MR and TR/ moderate LAE  . TRANSURETHRAL RESECTION OF BLADDER TUMOR N/A 02/10/2021   Procedure: TRANSURETHRAL RESECTION OF BLADDER TUMOR (TURBT)/ CYSTOSCOPY/ LEFT RETROGRADE;  Surgeon: [name], MD;  Location: [facility];  Service: Urology;  Laterality: N/A;  GENERAL ANESTHESIA WITH PARALYSIS    reports that he has never smoked. He has quit using smokeless tobacco.  His smokeless tobacco use included chew. He reports that he does not drink alcohol and does not use drugs. family history includes Cancer in his mother; Hypertension in his mother. Allergies  Allergen Reactions  . Xarelto [Rivaroxaban] Other (See Comments)    Skin Blisters       Outpatient Encounter Medications as of 04/06/2021  Medication Sig  . amLODipine (NORVASC) 5 MG tablet Take 1 tablet (5 mg total) by mouth daily.  Marland Kitchen apixaban (ELIQUIS) 5 MG TABS tablet Take 5 mg by mouth 2 (two) times daily.  . diphenhydramine-acetaminophen (TYLENOL PM) 25-500 MG TABS tablet Take 0.5 tablets by mouth at bedtime as needed (sleep).  . furosemide (LASIX) 20 MG tablet Take 1 tablet (20 mg total) by mouth daily as needed for edema.  . isosorbide mononitrate (IMDUR) 30 MG 24 hr tablet Take 1 tablet (30 mg total) by mouth daily.  Marland Kitchen levofloxacin (LEVAQUIN) 250 MG tablet Take 250 mg by mouth daily.  . metoprolol tartrate (LOPRESSOR) 25 MG tablet TAKE ONE TABLET BY MOUTH TWICE A DAY  . nitroGLYCERIN (NITROSTAT) 0.4 MG SL tablet PLACE ONE TABLET UNDER THE TONGUE EVERY 5 MINUTES TIMES THREE DOSES AS NEEDED FOR CHEST PAIN. CALL 911 IF 2ND DOSE DOES NOT HELP  . olmesartan (BENICAR) 20 MG tablet Take 1 tablet (20 mg total) by mouth daily.  . rosuvastatin (CRESTOR) 40 MG tablet Take 1 tablet (40 mg total) by mouth every morning.  . sitaGLIPtin  (JANUVIA) 100 MG tablet Take 1 tablet (100 mg total) by mouth daily.  . [DISCONTINUED] traMADol (ULTRAM) 50 MG tablet Take 1-2 tablets (50-100 mg total) by mouth every 6 (six) hours as needed (pain).   Facility-Administered Encounter Medications as of 04/06/2021  Medication  . sodium phosphate (FLEET) 7-19 GM/118ML enema 1 enema     REVIEW OF SYSTEMS  : All other systems reviewed and negative except where noted in the History of Present Illness.   PHYSICAL EXAM: BP 120/66   Pulse 76   Ht 5\' 11"  (1.803 m)   Wt 214 lb 9.6 oz (97.3 kg)   BMI 29.93 kg/m  General: Well developed white male in no acute distress Head: Normocephalic and atraumatic Eyes:  Sclerae anicteric, conjunctiva pink. Ears: Normal auditory acuity Lungs: Clear throughout to auscultation; no W/R/R. Heart: Irregular; no M/R/G. Abdomen: Soft, non-distended.  BS present.  Non-tender.  Small umbilical hernia noted.  Rectal:  Will be done at the time  of colonoscopy. Musculoskeletal: Symmetrical with no gross deformities  Skin: No lesions on visible extremities Extremities: No edema  Neurological: Alert oriented x 4, grossly non-focal Psychological:  Alert and cooperative. Normal mood and affect  ASSESSMENT AND PLAN: *CRC screening:  Never had a colonoscopy in the past.  No complaints.  Will schedule with Dr. Havery Moros. *Chronic anticoagulation with Eliquis for atrial fibrillation:  Will hold Eliquis for 2 days prior to endoscopic procedures - will instruct when and how to resume after procedure. Benefits and risks of procedure explained including risks of bleeding, perforation, infection, missed lesions, reactions to medications and possible need for hospitalization and surgery for complications. Additional rare but real risk of stroke or other vascular clotting events off Eliquis also explained and need to seek urgent help if any signs of these problems occur. Will communicate by phone or EMR with patient's prescribing  provider, Dr. Percival Spanish to confirm that holding Eliquis is reasonable in this case.    CC:  Ronnald Nian, DO

## 2021-04-06 NOTE — Progress Notes (Signed)
Jess agree with plan however the patient has CKD with a GFR in the 20s. He will need to hold Eliquis for 4 days prior to the procedure given his GFR and bleeding risk. Can you please make sure this is corrected in the request to Dr. Percival Spanish and please let the patient know. Thanks

## 2021-04-06 NOTE — Telephone Encounter (Signed)
Dr. Havery Moros would like patient to hold Eliquis for 4 days  instead of 2. Please advise.

## 2021-04-07 ENCOUNTER — Telehealth: Payer: Self-pay | Admitting: Family Medicine

## 2021-04-07 NOTE — Telephone Encounter (Signed)
FYI patient was only approved for 2 days.

## 2021-04-07 NOTE — Telephone Encounter (Signed)
Left message for patient to call back and schedule Medicare Annual Wellness Visit (AWV) in office.   If not able to come in office, please offer to do virtually or by telephone.   Last AWV:05/22/2016   Please schedule at anytime with Nurse Health Advisor.

## 2021-04-07 NOTE — Telephone Encounter (Signed)
Hi Dr. Percival Spanish, Given the patient's GFR is in high 20s, he is still at bleeding risk for polypectomy with only a 2 day hold. Our GI guidelines recommend a 4 day hold for Eliquis for colonoscopy with GFR this impaired in order to reduce his bleeding risk. Would it be okay if the patient can do that, or if not, is there a timeframe when he would be able to hold it for 4 days? Thanks for your help.  Adam Harris

## 2021-04-08 DIAGNOSIS — C678 Malignant neoplasm of overlapping sites of bladder: Secondary | ICD-10-CM | POA: Diagnosis not present

## 2021-04-12 NOTE — Telephone Encounter (Signed)
Per The American Journal of Gastroenterology: April 2022 - Volume 117 - Issue 4  "The duration of temporary DOAC interruption before endoscopic procedures associated with favorable outcomes is between 1 and 2 days, excluding the day of the procedure, which permits the shortest preprocedural duration of DOAC interruption while balancing bleeding and thromboembolism risk." . No mention of GFR.   Apixaban - 8 to 15 hours; five half-lives will have elapsed by day 1.5 to 3 after the last dose in normal renal function. Accumulation can could results on renal impairment.  Renal dose recommendations for Apixaban use CrCl (per clinical trials) and not GFR. Current CrCl =  38 ml/min (current weight) and 68ml/min with adjusted body weight.  Per cardiologist risk vs benefits assessment. No need to HOLD eliquis for more than 2 days prior to procedure. Okay to resume 1-2 days after procedure if polyps removed.   *MD performing procedure will need to make foinal decision if cardiology recommendations not agreeable with GI assessment and plan*

## 2021-04-14 NOTE — Telephone Encounter (Signed)
Heather I have spoken with Dr. Percival Spanish about this and updated guidelines. Okay to hold 2 days prior to the procedure. Our office will be coming up with standardized protocol for holding anticoagulation pre-procedures given updated guidelines to make sure we are all on the same page. Thanks

## 2021-04-14 NOTE — Telephone Encounter (Signed)
Please advise on recommendations.

## 2021-04-15 NOTE — Telephone Encounter (Signed)
Informed patient to hold Eliquis 2 days prior to procedure. Patient voiced understanding.

## 2021-04-19 DIAGNOSIS — Z5111 Encounter for antineoplastic chemotherapy: Secondary | ICD-10-CM | POA: Diagnosis not present

## 2021-04-19 DIAGNOSIS — C678 Malignant neoplasm of overlapping sites of bladder: Secondary | ICD-10-CM | POA: Diagnosis not present

## 2021-04-22 ENCOUNTER — Other Ambulatory Visit: Payer: Self-pay

## 2021-04-22 ENCOUNTER — Encounter: Payer: Self-pay | Admitting: Gastroenterology

## 2021-04-22 ENCOUNTER — Ambulatory Visit (AMBULATORY_SURGERY_CENTER): Payer: Medicare Other | Admitting: Gastroenterology

## 2021-04-22 VITALS — BP 118/73 | HR 65 | Temp 96.6°F | Resp 12 | Ht 71.0 in | Wt 214.0 lb

## 2021-04-22 DIAGNOSIS — Z1211 Encounter for screening for malignant neoplasm of colon: Secondary | ICD-10-CM

## 2021-04-22 DIAGNOSIS — D122 Benign neoplasm of ascending colon: Secondary | ICD-10-CM | POA: Diagnosis not present

## 2021-04-22 DIAGNOSIS — D124 Benign neoplasm of descending colon: Secondary | ICD-10-CM

## 2021-04-22 DIAGNOSIS — D125 Benign neoplasm of sigmoid colon: Secondary | ICD-10-CM | POA: Diagnosis not present

## 2021-04-22 DIAGNOSIS — D123 Benign neoplasm of transverse colon: Secondary | ICD-10-CM

## 2021-04-22 MED ORDER — SODIUM CHLORIDE 0.9 % IV SOLN: 500.0000 mL | Freq: Once | INTRAVENOUS | Status: DC

## 2021-04-22 NOTE — Patient Instructions (Signed)
Please read handouts provided. Continue present medications. Await pathology results. Resume Eliquis tomorrow.    YOU HAD AN ENDOSCOPIC PROCEDURE TODAY AT [facility]:   Refer to the procedure report that was given to you for any specific questions about what was found during the examination.  If the procedure report does not answer your questions, please call your gastroenterologist to clarify.  If you requested that your care partner not be given the details of your procedure findings, then the procedure report has been included in a sealed envelope for you to review at your convenience later.  YOU SHOULD EXPECT: Some feelings of bloating in the abdomen. Passage of more gas than usual.  Walking can help get rid of the air that was put into your GI tract during the procedure and reduce the bloating. If you had a lower endoscopy (such as a colonoscopy or flexible sigmoidoscopy) you may notice spotting of blood in your stool or on the toilet paper. If you underwent a bowel prep for your procedure, you may not have a normal bowel movement for a few days.  Please Note:  You might notice some irritation and congestion in your nose or some drainage.  This is from the oxygen used during your procedure.  There is no need for concern and it should clear up in a day or so.  SYMPTOMS TO REPORT IMMEDIATELY:  Following lower endoscopy (colonoscopy or flexible sigmoidoscopy):  Excessive amounts of blood in the stool  Significant tenderness or worsening of abdominal pains  Swelling of the abdomen that is new, acute  Fever of 100F or higher   For urgent or emergent issues, a gastroenterologist can be reached at any hour by calling [phone]. Do not use MyChart messaging for urgent concerns.    DIET:  We do recommend a small meal at first, but then you may proceed to your regular diet.  Drink plenty of fluids but you should avoid alcoholic beverages for 24 hours.  ACTIVITY:  You  should plan to take it easy for the rest of today and you should NOT DRIVE or use heavy machinery until tomorrow (because of the sedation medicines used during the test).    FOLLOW UP: Our staff will call the number listed on your records 48-72 hours following your procedure to check on you and address any questions or concerns that you may have regarding the information given to you following your procedure. If we do not reach you, we will leave a message.  We will attempt to reach you two times.  During this call, we will ask if you have developed any symptoms of COVID 19. If you develop any symptoms (ie: fever, flu-like symptoms, shortness of breath, cough etc.) before then, please call [phone].  If you test positive for Covid 19 in the 2 weeks post procedure, please call and report this information to [place].    If any biopsies were taken you will be contacted by phone or by letter within the next 1-3 weeks.  Please call us at [phone] if you have not heard about the biopsies in 3 weeks.    SIGNATURES/CONFIDENTIALITY: You and/or your care partner have signed paperwork which will be entered into your electronic medical record.  These signatures attest to the fact that that the information above on your After Visit Summary has been reviewed and is understood.  Full responsibility of the confidentiality of this discharge information lies with you and/or your care-partner.

## 2021-04-22 NOTE — Op Note (Signed)
[facility] Patient Name: Adam Harris Procedure Date: 04/22/2021 10:44 AM MRN: [id number] Endoscopist: Remo Lipps P. Havery Moros , MD Age: 75 Referring MD:  Date of Birth: 03-Aug-1946 Gender: Male Account #: [id number] Procedure:                Colonoscopy Indications:              Screening for colorectal malignant neoplasm, This                            is the patient's first colonoscopy Medicines:                Monitored Anesthesia Care Procedure:                Pre-Anesthesia Assessment:                           - Prior to the procedure, a History and Physical                            was performed, and patient medications and                            allergies were reviewed. The patient's tolerance of                            previous anesthesia was also reviewed. The risks                            and benefits of the procedure and the sedation                            options and risks were discussed with the patient.                            All questions were answered, and informed consent                            was obtained. Prior Anticoagulants: The patient has                            taken Eliquis (apixaban), last dose was 2 days                            prior to procedure. ASA Grade Assessment: III - A                            patient with severe systemic disease. After                            reviewing the risks and benefits, the patient was                            deemed in satisfactory condition to undergo the  procedure.                           After obtaining informed consent, the colonoscope                            was passed under direct vision. Throughout the                            procedure, the patient's blood pressure, pulse, and                            oxygen saturations were monitored continuously. The                            Olympus CF-[id number]) Colonoscope was                             introduced through the anus and advanced to the the                            cecum, identified by appendiceal orifice and                            ileocecal valve. The colonoscopy was performed                            without difficulty. The patient tolerated the                            procedure well. The quality of the bowel                            preparation was good. The ileocecal valve,                            appendiceal orifice, and rectum were photographed. Scope In: 10:46:06 AM Scope Out: 11:14:09 AM Scope Withdrawal Time: 0 hours 20 minutes 32 seconds  Total Procedure Duration: 0 hours 28 minutes 3 seconds  Findings:                 The perianal and digital rectal examinations were                            normal.                           Four sessile polyps were found in the ascending                            colon. The polyps were 4 to 10 mm in size. These                            polyps were removed with a cold snare. Resection  and retrieval were complete.                           A 3 mm polyp was found in the transverse colon. The                            polyp was sessile. The polyp was removed with a                            cold snare. Resection and retrieval were complete.                           Two flat and sessile polyps were found in the                            descending colon. The polyps were 3 to 4 mm in                            size. These polyps were removed with a cold snare.                            Resection and retrieval were complete.                           Three sessile polyps were found in the sigmoid                            colon. The polyps were 3 to 4 mm in size. These                            polyps were removed with a cold snare. Resection                            and retrieval were complete.                           Internal hemorrhoids were found during  retroflexion.                           The exam was otherwise without abnormality on                            direct and retroflexion views. Complications:            No immediate complications. Estimated blood loss:                            Minimal. Estimated Blood Loss:     Estimated blood loss was minimal. Impression:               - Four 4 to 10 mm polyps in the ascending colon,                            removed with a  cold snare. Resected and retrieved.                           - One 3 mm polyp in the transverse colon, removed                            with a cold snare. Resected and retrieved.                           - Two 3 to 4 mm polyps in the descending colon,                            removed with a cold snare. Resected and retrieved.                           - Three 3 to 4 mm polyps in the sigmoid colon,                            removed with a cold snare. Resected and retrieved.                           - Internal hemorrhoids.                           - The examination was otherwise normal on direct                            and retroflexion views. Recommendation:           - Patient has a contact number available for                            emergencies. The signs and symptoms of potential                            delayed complications were discussed with the                            patient. Return to normal activities tomorrow.                            Written discharge instructions were provided to the                            patient.                           - Resume previous diet.                           - Continue present medications.                           - Resume Eliquis tomorrow                           -  Await pathology results. Remo Lipps P. Kemonie Cutillo, MD 04/22/2021 11:20:07 AM This report has been signed electronically.

## 2021-04-22 NOTE — Progress Notes (Signed)
VS taken by  

## 2021-04-22 NOTE — Progress Notes (Signed)
A and O x3. Report to RN. Tolerated MAC anesthesia well. 

## 2021-04-22 NOTE — Progress Notes (Signed)
Called to room to assist during endoscopic procedure.  Patient ID and intended procedure confirmed with present staff. Received instructions for my participation in the procedure from the performing physician.  

## 2021-04-26 ENCOUNTER — Telehealth: Payer: Self-pay

## 2021-04-26 ENCOUNTER — Telehealth: Payer: Self-pay | Admitting: *Deleted

## 2021-04-26 NOTE — Telephone Encounter (Signed)
Second attempt, left VM.  

## 2021-04-26 NOTE — Telephone Encounter (Signed)
First attempt follow up call to pt, lm on vm 

## 2021-04-29 DIAGNOSIS — C678 Malignant neoplasm of overlapping sites of bladder: Secondary | ICD-10-CM | POA: Diagnosis not present

## 2021-04-29 DIAGNOSIS — Z5111 Encounter for antineoplastic chemotherapy: Secondary | ICD-10-CM | POA: Diagnosis not present

## 2021-05-18 DIAGNOSIS — H35371 Puckering of macula, right eye: Secondary | ICD-10-CM | POA: Diagnosis not present

## 2021-05-18 DIAGNOSIS — H25813 Combined forms of age-related cataract, bilateral: Secondary | ICD-10-CM | POA: Diagnosis not present

## 2021-05-18 DIAGNOSIS — E119 Type 2 diabetes mellitus without complications: Secondary | ICD-10-CM | POA: Diagnosis not present

## 2021-05-18 DIAGNOSIS — H35031 Hypertensive retinopathy, right eye: Secondary | ICD-10-CM | POA: Diagnosis not present

## 2021-05-18 LAB — HM DIABETES EYE EXAM

## 2021-05-27 DIAGNOSIS — C61 Malignant neoplasm of prostate: Secondary | ICD-10-CM | POA: Diagnosis not present

## 2021-05-27 DIAGNOSIS — Z8551 Personal history of malignant neoplasm of bladder: Secondary | ICD-10-CM | POA: Diagnosis not present

## 2021-05-27 DIAGNOSIS — Z85528 Personal history of other malignant neoplasm of kidney: Secondary | ICD-10-CM | POA: Diagnosis not present

## 2021-05-27 DIAGNOSIS — C678 Malignant neoplasm of overlapping sites of bladder: Secondary | ICD-10-CM | POA: Diagnosis not present

## 2021-05-27 DIAGNOSIS — K429 Umbilical hernia without obstruction or gangrene: Secondary | ICD-10-CM | POA: Diagnosis not present

## 2021-05-27 DIAGNOSIS — Z8546 Personal history of malignant neoplasm of prostate: Secondary | ICD-10-CM | POA: Diagnosis not present

## 2021-05-27 LAB — PSA: PSA: 5.54

## 2021-05-31 ENCOUNTER — Ambulatory Visit (INDEPENDENT_AMBULATORY_CARE_PROVIDER_SITE_OTHER): Payer: Medicare Other | Admitting: *Deleted

## 2021-05-31 DIAGNOSIS — Z0000 Encounter for general adult medical examination without abnormal findings: Secondary | ICD-10-CM

## 2021-05-31 NOTE — Patient Instructions (Signed)
Adam Harris , Thank you for taking time to come for your Medicare Wellness Visit. I appreciate your ongoing commitment to your health goals. Please review the following plan we discussed and let me know if I can assist you in the future.   Screening recommendations/referrals: Colonoscopy: up to date Recommended yearly ophthalmology/optometry visit for glaucoma screening and checkup Recommended yearly dental visit for hygiene and checkup  Vaccinations: Influenza vaccine: Education provided Pneumococcal vaccine: up to date Tdap vaccine: up to date Shingles vaccine: Education provided    Advanced directives: Education provided  Conditions/risks identified: na  Preventive Care 74 Years and Older, Male Preventive care refers to lifestyle choices and visits with your health care provider that can promote health and wellness. What does preventive care include? A yearly physical exam. This is also called an annual well check. Dental exams once or twice a year. Routine eye exams. Ask your health care provider how often you should have your eyes checked. Personal lifestyle choices, including: Daily care of your teeth and gums. Regular physical activity. Eating a healthy diet. Avoiding tobacco and drug use. Limiting alcohol use. Practicing safe sex. Taking low doses of aspirin every day. Taking vitamin and mineral supplements as recommended by your health care provider. What happens during an annual well check? The services and screenings done by your health care provider during your annual well check will depend on your age, overall health, lifestyle risk factors, and family history of disease. Counseling  Your health care provider may ask you questions about your: Alcohol use. Tobacco use. Drug use. Emotional well-being. Home and relationship well-being. Sexual activity. Eating habits. History of falls. Memory and ability to understand (cognition). Work and work  Statistician. Screening  You may have the following tests or measurements: Height, weight, and BMI. Blood pressure. Lipid and cholesterol levels. These may be checked every 5 years, or more frequently if you are over 74 years old. Skin check. Lung cancer screening. You may have this screening every year starting at age 74 if you have a 30-pack-year history of smoking and currently smoke or have quit within the past 15 years. Fecal occult blood test (FOBT) of the stool. You may have this test every year starting at age 74. Flexible sigmoidoscopy or colonoscopy. You may have a sigmoidoscopy every 5 years or a colonoscopy every 10 years starting at age 74. Prostate cancer screening. Recommendations will vary depending on your family history and other risks. Hepatitis C blood test. Hepatitis B blood test. Sexually transmitted disease (STD) testing. Diabetes screening. This is done by checking your blood sugar (glucose) after you have not eaten for a while (fasting). You may have this done every 1-3 years. Abdominal aortic aneurysm (AAA) screening. You may need this if you are a current or former smoker. Osteoporosis. You may be screened starting at age 50 if you are at high risk. Talk with your health care provider about your test results, treatment options, and if necessary, the need for more tests. Vaccines  Your health care provider may recommend certain vaccines, such as: Influenza vaccine. This is recommended every year. Tetanus, diphtheria, and acellular pertussis (Tdap, Td) vaccine. You may need a Td booster every 10 years. Zoster vaccine. You may need this after age 74. Pneumococcal 13-valent conjugate (PCV13) vaccine. One dose is recommended after age 74. Pneumococcal polysaccharide (PPSV23) vaccine. One dose is recommended after age 74. Talk to your health care provider about which screenings and vaccines you need and how often you need them.  This information is not intended to replace  advice given to you by your health care provider. Make sure you discuss any questions you have with your health care provider. Document Released: 11/26/2015 Document Revised: 07/19/2016 Document Reviewed: 08/31/2015 Elsevier Interactive Patient Education  2017 [employer] Prevention in the Home Falls can cause injuries. They can happen to people of all ages. There are many things you can do to make your home safe and to help prevent falls. What can I do on the outside of my home? Regularly fix the edges of walkways and driveways and fix any cracks. Remove anything that might make you trip as you walk through a door, such as a raised step or threshold. Trim any bushes or trees on the path to your home. Use bright outdoor lighting. Clear any walking paths of anything that might make someone trip, such as rocks or tools. Regularly check to see if handrails are loose or broken. Make sure that both sides of any steps have handrails. Any raised decks and porches should have guardrails on the edges. Have any leaves, snow, or ice cleared regularly. Use sand or salt on walking paths during winter. Clean up any spills in your garage right away. This includes oil or grease spills. What can I do in the bathroom? Use night lights. Install grab bars by the toilet and in the tub and shower. Do not use towel bars as grab bars. Use non-skid mats or decals in the tub or shower. If you need to sit down in the shower, use a plastic, non-slip stool. Keep the floor dry. Clean up any water that spills on the floor as soon as it happens. Remove soap buildup in the tub or shower regularly. Attach bath mats securely with double-sided non-slip rug tape. Do not have throw rugs and other things on the floor that can make you trip. What can I do in the bedroom? Use night lights. Make sure that you have a light by your bed that is easy to reach. Do not use any sheets or blankets that are too big for your bed.  They should not hang down onto the floor. Have a firm chair that has side arms. You can use this for support while you get dressed. Do not have throw rugs and other things on the floor that can make you trip. What can I do in the kitchen? Clean up any spills right away. Avoid walking on wet floors. Keep items that you use a lot in easy-to-reach places. If you need to reach something above you, use a strong step stool that has a grab bar. Keep electrical cords out of the way. Do not use floor polish or wax that makes floors slippery. If you must use wax, use non-skid floor wax. Do not have throw rugs and other things on the floor that can make you trip. What can I do with my stairs? Do not leave any items on the stairs. Make sure that there are handrails on both sides of the stairs and use them. Fix handrails that are broken or loose. Make sure that handrails are as long as the stairways. Check any carpeting to make sure that it is firmly attached to the stairs. Fix any carpet that is loose or worn. Avoid having throw rugs at the top or bottom of the stairs. If you do have throw rugs, attach them to the floor with carpet tape. Make sure that you have a light switch at the  top of the stairs and the bottom of the stairs. If you do not have them, ask someone to add them for you. What else can I do to help prevent falls? Wear shoes that: Do not have high heels. Have rubber bottoms. Are comfortable and fit you well. Are closed at the toe. Do not wear sandals. If you use a stepladder: Make sure that it is fully opened. Do not climb a closed stepladder. Make sure that both sides of the stepladder are locked into place. Ask someone to hold it for you, if possible. Clearly mark and make sure that you can see: Any grab bars or handrails. First and last steps. Where the edge of each step is. Use tools that help you move around (mobility aids) if they are needed. These  include: Canes. Walkers. Scooters. Crutches. Turn on the lights when you go into a dark area. Replace any light bulbs as soon as they burn out. Set up your furniture so you have a clear path. Avoid moving your furniture around. If any of your floors are uneven, fix them. If there are any pets around you, be aware of where they are. Review your medicines with your doctor. Some medicines can make you feel dizzy. This can increase your chance of falling. Ask your doctor what other things that you can do to help prevent falls. This information is not intended to replace advice given to you by your health care provider. Make sure you discuss any questions you have with your health care provider. Document Released: 08/26/2009 Document Revised: 04/06/2016 Document Reviewed: 12/04/2014 Elsevier Interactive Patient Education  2017 [employer].

## 2021-05-31 NOTE — Progress Notes (Signed)
Subjective:   Adam Harris is a 75 y.o. male who presents for Medicare Annual/Subsequent preventive examination.  I connected with  Adam Harris on 05/31/21 by a telephone enabled telemedicine application and verified that I am speaking with the correct person using two identifiers.   I discussed the limitations of evaluation and management by telemedicine. The patient expressed understanding and agreed to proceed.   Review of Systems    NA Cardiac Risk Factors include: advanced age (>22men, >23 women);diabetes mellitus;hypertension     Objective:    Today's Vitals   There is no height or weight on file to calculate BMI.  Advanced Directives 05/31/2021 02/03/2021 08/12/2020 03/25/2020 03/22/2020 03/11/2020 12/15/2019  Does Patient Have a Medical Advance Directive? No No No No No No No  Would patient like information on creating a medical advance directive? No - Patient declined - No - Patient declined - No - Patient declined No - Patient declined Yes (MAU/Ambulatory/Procedural Areas - Information given)  Pre-existing out of facility DNR order (yellow form or pink MOST form) - - - - - - -    Current Medications (verified) Outpatient Encounter Medications as of 05/31/2021  Medication Sig   amLODipine (NORVASC) 5 MG tablet Take 1 tablet (5 mg total) by mouth daily.   apixaban (ELIQUIS) 5 MG TABS tablet Take 5 mg by mouth 2 (two) times daily.   diphenhydramine-acetaminophen (TYLENOL PM) 25-500 MG TABS tablet Take 0.5 tablets by mouth at bedtime as needed (sleep).   furosemide (LASIX) 20 MG tablet Take 1 tablet (20 mg total) by mouth daily as needed for edema.   isosorbide mononitrate (IMDUR) 30 MG 24 hr tablet Take 1 tablet (30 mg total) by mouth daily.   levofloxacin (LEVAQUIN) 250 MG tablet Take 250 mg by mouth daily.   metoprolol tartrate (LOPRESSOR) 25 MG tablet TAKE ONE TABLET BY MOUTH TWICE A DAY   nitroGLYCERIN (NITROSTAT) 0.4 MG SL tablet PLACE ONE TABLET UNDER THE TONGUE EVERY 5 MINUTES  TIMES THREE DOSES AS NEEDED FOR CHEST PAIN. CALL 911 IF 2ND DOSE DOES NOT HELP   olmesartan (BENICAR) 20 MG tablet Take 1 tablet (20 mg total) by mouth daily.   rosuvastatin (CRESTOR) 40 MG tablet Take 1 tablet (40 mg total) by mouth every morning.   sitaGLIPtin (JANUVIA) 100 MG tablet Take 1 tablet (100 mg total) by mouth daily.   Facility-Administered Encounter Medications as of 05/31/2021  Medication   sodium phosphate (FLEET) 7-19 GM/118ML enema 1 enema    Allergies (verified) Xarelto [rivaroxaban]   History: Past Medical History:  Diagnosis Date   Abnormal radiologic findings on diagnostic imaging of renal pelvis, ureter, or bladder    bilateral ureter abnormalities   Anticoagulant long-term use    eliquis   Anxiety    Arthritis    CAD (coronary artery disease) cardiologist-- dr hochrein   NSTEMI 02-04-2014  per cardiac cath chronic occluded RCA w/ faint left-to-right collaterals and aneurysmal LCFx with sluggish coronary flow/   NSTEMI --11-21-2016 per cardiac cath occluded proximal RCA & mid to diastal CFX 100%, med rx. 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   Chronic diastolic heart failure ([place])    CKD (chronic kidney disease), stage III ([place])    patient unaware   DOE (dyspnea on exertion)    2-3 flights of stairs   Dyspnea    With exertion   Fatty liver    pt denies  Hematuria 02/2019   History of COVID-19 10/2019   History of non-ST elevation myocardial infarction (NSTEMI)    02-04-2014  and 11-21-2016  cardiac cath done both times ,  medically management   History of shingles 12/2017   slight pain and numbness still noted in the area   Hyperlipidemia    Hypertension    Insomnia    Myocardial infarction [facility]) 2015   Persistent atrial fibrillation [facility])    cardiologsit-- dr hochrein   Sciatica    Thoracic aortic atherosclerosis ([place])    Type 2 diabetes mellitus ([place])    Urinary frequency    Past Surgical History:   Procedure Laterality Date   CARDIAC CATHETERIZATION N/A 11/21/2016   Procedure: Left Heart Cath and Coronary Angiography;  Harris: Troy Sine, MD;  Location: [place] CV [facility];  Service: Cardiovascular;  Laterality: N/A;  pRCA 100% , ostial LAD 45%, OM3 80%, mCFX to dCFX 100% (AV groove), lateral OM3 50%   COLONOSCOPY     CYSTOSCOPY WITH BIOPSY N/A 08/12/2020   Procedure: CYSTOSCOPY WITH BLADDER BIOPSY AND TRANSURETHRAL RESECTION OF BLADDER TUMOR;  Harris: [name], MD;  Location: [facility];  Service: Urology;  Laterality: N/A;   CYSTOSCOPY WITH URETEROSCOPY AND STENT PLACEMENT Right 12/15/2019   Procedure: CYSTOSCOPY WITH RIGHT RETROGRADE/ RIGHT URETEROSCOPY/ BIOPSY;  Harris: [name], MD;  Location: [place];  Service: Urology;  Laterality: Right;   CYSTOSCOPY/RETROGRADE/URETEROSCOPY Bilateral 03/18/2018   Procedure: CYSTOSCOPY/RETROGRADE/URETEROSCOPY.;  Harris: [name], MD;  Location[facility];  Service: Urology;  Laterality: Bilateral;   KIDNEY SURGERY     KNEE ARTHROSCOPY Right    LEFT HEART CATHETERIZATION WITH CORONARY ANGIOGRAM N/A 02/04/2014   Procedure: LEFT HEART CATHETERIZATION WITH CORONARY ANGIOGRAM;  Harris: [name], MD;  Location: [place] CATH [facility];  Service: Cardiovascular;  Laterality: N/A;  severe one-vessel CAD, chronically occluded RCA with faint left-to-right collaterals;  aneurysmal LCFx with sluggish coronary flow;  normal LVSF w/ moderately elevated LVEDP (ostialOM2 20%, pOM3 20%, pD1 20%, mCFX 50%, diffuse 20% pCFX)   PROSTATE BIOPSY N/A 08/12/2020   Procedure: BIOPSY TRANSRECTAL ULTRASONIC PROSTATE (TUBP);  Harris: [name], MD;  [facility];  Service: Urology;  Laterality: N/A;   ROBOT ASSITED LAPAROSCOPIC NEPHROURETERECTOMY Right 03/22/2020   Procedure: XI ROBOT ASSITED LAPAROSCOPIC NEPHROURETERECTOMY;  Harris: [name], MD;  [facility];  Service: Urology;  Laterality: Right;   TRANSTHORACIC  ECHOCARDIOGRAM  11-22-2016   dr hochrein   ef 55-60%/ mild MR and TR/ moderate LAE   TRANSURETHRAL RESECTION OF BLADDER TUMOR N/A 02/10/2021   Procedure: TRANSURETHRAL RESECTION OF BLADDER TUMOR (TURBT)/ CYSTOSCOPY/ LEFT RETROGRADE;  Harris: [name], MD;  [facility];  Service: Urology;  Laterality: N/A;  GENERAL ANESTHESIA WITH PARALYSIS   Family History  Problem Relation Age of Onset   Hypertension Mother    Cancer Mother        anal cancer   Esophageal cancer Neg Hx    Pancreatic cancer Neg Hx    Stomach cancer Neg Hx    Liver disease Neg Hx    Social History   Socioeconomic History   Marital status: Married    Spouse name: Not on file   Number of children: Not on file   Years of education: Not on file   Highest education level: Not on file  Occupational History   Not on file  Tobacco Use   Smoking status: Never   Smokeless tobacco: Former  Types: Chew  Vaping Use   Vaping Use: Never used  Substance and Sexual Activity   Alcohol use: No   Drug use: No   Sexual activity: Yes  Other Topics Concern   Not on file  Social History Narrative   Not on file   Social Determinants of Health   Financial Resource Strain: Low Risk    Difficulty of Paying Living Expenses: Not hard at all  Food Insecurity: No Food Insecurity   Worried About Charity fundraiser in the Last Year: Never true   Ran Out of Food in the Last Year: Never true  Transportation Needs: No Transportation Needs   Lack of Transportation (Medical): No   Lack of Transportation (Non-Medical): No  Physical Activity: Insufficiently Active   Days of Exercise per Week: 3 days   Minutes of Exercise per Session: 30 min  Stress: No Stress Concern Present   Feeling of Stress : Not at all  Social Connections: Moderately Integrated   Frequency of Communication with Friends and Family: More than three times a week   Frequency of Social Gatherings with Friends and Family: Three times a week   Attends  Religious Services: 1 to 4 times per year   Active Member of Clubs or Organizations: No   Attends Archivist Meetings: Never   Marital Status: Married    Tobacco Counseling Counseling given: Not Answered   Clinical Intake:  Pre-visit preparation completed: Yes  Pain : No/denies pain     Nutritional Risks: None Diabetes: Yes CBG done?: No Did pt. bring in CBG monitor from home?: No  How often do you need to have someone help you when you read instructions, pamphlets, or other written materials from your doctor or pharmacy?: 1 - Never  Diabetic?  Yes  Nutrition Risk Assessment:  Has the patient had any N/V/D within the last 2 months?  No  Does the patient have any non-healing wounds?  No  Has the patient had any unintentional weight loss or weight gain?  No   Diabetes:  Is the patient diabetic?  Yes  If diabetic, was a CBG obtained today?  No  Did the patient bring in their glucometer from home?  No  How often do you monitor your CBG's? Never.   Financial Strains and Diabetes Management:  Are you having any financial strains with the device, your supplies or your medication? No .  Does the patient want to be seen by Chronic Care Management for management of their diabetes?  No  Would the patient like to be referred to a Nutritionist or for Diabetic Management?  Yes   Diabetic Exams:  Diabetic Eye Exam: Completed . Overdue for diabetic eye exam. Pt has been advised about the importance in completing this exam. A referral has been placed today. Message sent to referral coordinator for scheduling purposes. Advised pt to expect a call from office referred to regarding appt.  Diabetic Foot Exam:. Pt has been advised about the importance in completing this exam. Pt is scheduled for diabetic foot exam on   Interpreter Needed?: No  Information entered by :: Leroy Kennedy LPN   Activities of Daily Living In your present state of health, do you have any  difficulty performing the following activities: 05/31/2021 02/03/2021  Hearing? N N  Vision? N N  Difficulty concentrating or making decisions? N N  Walking or climbing stairs? N N  Dressing or bathing? N N  Doing errands, shopping? N N  Preparing  Food and eating ? N -  Using the Toilet? N -  In the past six months, have you accidently leaked urine? Y -  Do you have problems with loss of bowel control? N -  Managing your Medications? N -  Managing your Finances? N -  Housekeeping or managing your Housekeeping? N -  Some recent data might be hidden    Patient Care Team: Ronnald Nian, DO as PCP - General (Family Medicine) Minus Breeding, MD as PCP - Cardiology (Cardiology) Germaine Pomfret, [facility] as Pharmacist (Pharmacist)  Indicate any recent Medical Services you may have received from other than [facility] providers in the past year (date may be approximate).     Assessment:   This is a routine wellness examination for Dandy.  Hearing/Vision screen Hearing Screening - Comments:: No hearing problems Vision Screening - Comments:: Dr. Katy Fitch Cataracts  Up to date  Dietary issues and exercise activities discussed: Current Exercise Habits: Home exercise routine, Type of exercise: walking, Time (Minutes): 25, Frequency (Times/Week): 4, Weekly Exercise (Minutes/Week): 100, Intensity: Moderate   Goals Addressed             This Visit's Progress    Patient Stated       Loose 10lbs       Depression Screen PHQ 2/9 Scores 05/31/2021 06/01/2020 11/27/2019 08/23/2018 05/22/2016  PHQ - 2 Score 0 0 0 0 0  PHQ- 9 Score - 4 - - -    Fall Risk Fall Risk  05/31/2021 12/17/2020 08/23/2018 05/22/2016  Falls in the past year? 0 0 Yes No  Number falls in past yr: 0 0 1 -  Injury with Fall? 0 0 - -  Follow up Falls evaluation completed;Falls prevention discussed - - -    FALL RISK PREVENTION PERTAINING TO THE HOME:  Any stairs in or around the home? No  If so, are there any without  handrails? Yes  Home free of loose throw rugs in walkways, pet beds, electrical cords, etc? Yes  Adequate lighting in your home to reduce risk of falls? Yes   ASSISTIVE DEVICES UTILIZED TO PREVENT FALLS:  Life alert? No  Use of a cane, walker or w/c? No  Grab bars in the bathroom? No  Shower chair or bench in shower? Yes  Elevated toilet seat or a handicapped toilet? No   TIMED UP AND GO:  Was the test performed? No .  Tele-Health visit  Cognitive Function:        Immunizations Immunization History  Administered Date(s) Administered   DTaP 12/24/2014   Fluad Quad(high Dose 65+) 09/04/2019, 12/17/2020   Influenza, High Dose Seasonal PF 08/23/2018   Influenza,inj,Quad PF,6+ Mos 07/27/2014   Influenza-Unspecified 07/27/2014   Pneumococcal Conjugate-13 05/22/2016   Pneumococcal Polysaccharide-23 02/03/2014, 12/24/2014   Tdap 02/03/2014, 12/24/2014, 07/07/2018    TDAP status: Up to date  Flu Vaccine status: Up to date  Pneumococcal vaccine status: Up to date  Covid-19 vaccine status: Information provided on how to obtain vaccines.   Qualifies for Shingles Vaccine? Yes   Zostavax completed No   Shingrix Completed?: No.    Education has been provided regarding the importance of this vaccine. Patient has been advised to call insurance company to determine out of pocket expense if they have not yet received this vaccine. Advised may also receive vaccine at local pharmacy or Health Dept. Verbalized acceptance and understanding.  Screening Tests Health Maintenance  Topic Date Due   COVID-19 Vaccine (1) Never done  OPHTHALMOLOGY EXAM  Never done   Zoster Vaccines- Shingrix (1 of 2) Never done   FOOT EXAM  05/22/2017   INFLUENZA VACCINE  06/13/2021   HEMOGLOBIN A1C  06/16/2021   TETANUS/TDAP  07/07/2028   COLONOSCOPY (Pts 45-35yrs Insurance coverage will need to be confirmed)  04/23/2031   Hepatitis C Screening  Completed   PNA vac Low Risk Adult  Completed   HPV  VACCINES  Aged Out    Health Maintenance  Health Maintenance Due  Topic Date Due   COVID-19 Vaccine (1) Never done   OPHTHALMOLOGY EXAM  Never done   Zoster Vaccines- Shingrix (1 of 2) Never done   FOOT EXAM  05/22/2017    Colorectal cancer screening: Type of screening: Colonoscopy. Completed 2022. Repeat every no longer required years  Lung Cancer Screening: (Low Dose CT Chest recommended if Age 76-80 years, 30 pack-year currently smoking OR have quit w/in 15years.) does not qualify.   Lung Cancer Screening Referral: na  Additional Screening:  Hepatitis C Screening: does qualify;   Vision Screening: Recommended annual ophthalmology exams for early detection of glaucoma and other disorders of the eye. Is the patient up to date with their annual eye exam?  Yes  Who is the provider or what is the name of the office in which the patient attends annual eye exams? Dr. Katy Fitch If pt is not established with a provider, would they like to be referred to a provider to establish care? Yes .   Dental Screening: Recommended annual dental exams for proper oral hygiene  Community Resource Referral / Chronic Care Management: CRR required this visit?  No   CCM required this visit?  No      Plan:     I have personally reviewed and noted the following in the patient's chart:   Medical and social history Use of alcohol, tobacco or illicit drugs  Current medications and supplements including opioid prescriptions. Patient is not currently taking opioid prescriptions. Functional ability and status Nutritional status Physical activity Advanced directives List of other physicians Hospitalizations, surgeries, and ER visits in previous 12 months Vitals Screenings to include cognitive, depression, and falls Referrals and appointments  In addition, I have reviewed and discussed with patient certain preventive protocols, quality metrics, and best practice recommendations. A written  personalized care plan for preventive services as well as general preventive health recommendations were provided to patient.     Leroy Kennedy, LPN   12/15/3341   Nurse Notes:

## 2021-06-01 ENCOUNTER — Encounter (INDEPENDENT_AMBULATORY_CARE_PROVIDER_SITE_OTHER): Payer: Medicare Other | Admitting: Ophthalmology

## 2021-06-01 ENCOUNTER — Other Ambulatory Visit: Payer: Self-pay

## 2021-06-01 DIAGNOSIS — H35371 Puckering of macula, right eye: Secondary | ICD-10-CM | POA: Diagnosis not present

## 2021-06-01 DIAGNOSIS — H35031 Hypertensive retinopathy, right eye: Secondary | ICD-10-CM | POA: Diagnosis not present

## 2021-06-01 DIAGNOSIS — I10 Essential (primary) hypertension: Secondary | ICD-10-CM

## 2021-06-01 DIAGNOSIS — H43811 Vitreous degeneration, right eye: Secondary | ICD-10-CM | POA: Diagnosis not present

## 2021-06-01 DIAGNOSIS — H2513 Age-related nuclear cataract, bilateral: Secondary | ICD-10-CM

## 2021-06-03 DIAGNOSIS — C651 Malignant neoplasm of right renal pelvis: Secondary | ICD-10-CM | POA: Diagnosis not present

## 2021-06-03 DIAGNOSIS — C61 Malignant neoplasm of prostate: Secondary | ICD-10-CM | POA: Diagnosis not present

## 2021-06-03 DIAGNOSIS — D090 Carcinoma in situ of bladder: Secondary | ICD-10-CM | POA: Diagnosis not present

## 2021-06-10 ENCOUNTER — Other Ambulatory Visit: Payer: Self-pay

## 2021-06-10 ENCOUNTER — Ambulatory Visit (INDEPENDENT_AMBULATORY_CARE_PROVIDER_SITE_OTHER): Payer: Medicare Other | Admitting: Family Medicine

## 2021-06-10 ENCOUNTER — Encounter: Payer: Self-pay | Admitting: Family Medicine

## 2021-06-10 VITALS — BP 138/90 | HR 62 | Temp 97.5°F | Wt 218.8 lb

## 2021-06-10 DIAGNOSIS — N1831 Chronic kidney disease, stage 3a: Secondary | ICD-10-CM | POA: Diagnosis not present

## 2021-06-10 DIAGNOSIS — R600 Localized edema: Secondary | ICD-10-CM

## 2021-06-10 DIAGNOSIS — I5032 Chronic diastolic (congestive) heart failure: Secondary | ICD-10-CM

## 2021-06-10 DIAGNOSIS — E1122 Type 2 diabetes mellitus with diabetic chronic kidney disease: Secondary | ICD-10-CM | POA: Diagnosis not present

## 2021-06-10 MED ORDER — SITAGLIPTIN PHOSPHATE 100 MG PO TABS: 50.0000 mg | ORAL_TABLET | Freq: Every day | ORAL | 3 refills | Status: DC

## 2021-06-10 NOTE — Progress Notes (Signed)
Adam Harris is a 75 y.o. male  Chief Complaint  Patient presents with   Follow-up    Pt c/o swelling in both ankles, no pain/redness.     HPI: Adam Harris is a 75 y.o. male patient who complains of B/L ankle edema. Pt thinks this is back to baseline and came to discuss at his wife's insistence.  He is taking lasix 20mg  daily for the past week or so.  Pt denies pain/tenderness, redness. Pt denies CP, SOB, DOE, orthopnea, edema in hands.  Pts weight has increased 4lbs in 2 mo. Pt states he has been eating more than he should.  He follows with cardio Dr. Percival Spanish, last seen in 01/2021. Pts last echo was in 11/2016: Study Conclusions  - Left ventricle: The cavity size was normal. Systolic function was    normal. The estimated ejection fraction was in the range of 55%    to 60%. Wall motion was normal; there were no regional wall    motion abnormalities.  - Aortic valve: Trileaflet; moderately thickened, moderately    calcified leaflets.  - Mitral valve: There was mild regurgitation.  - Left atrium: The atrium was moderately dilated.  - Tricuspid valve: There was mild regurgitation.  - Pulmonary arteries: Systolic pressure was mildly increased. PA    peak pressure: 37 mm Hg (S).   Impressions:  - Compared to the prior study, there has been no significant    interval change.  Study data:  Comparison was made to the study of 12/08/2014.  Nephro note from 03/2021 recommended decreasing januvia to 25mg  from 100mg  due to CKD.   Past Medical History:  Diagnosis Date   Abnormal radiologic findings on diagnostic imaging of renal pelvis, ureter, or bladder    bilateral ureter abnormalities   Anticoagulant long-term use    eliquis   Anxiety    Arthritis    CAD (coronary artery disease) cardiologist-- dr hochrein   NSTEMI 02-04-2014  per cardiac cath chronic occluded RCA w/ faint left-to-right collaterals and aneurysmal LCFx with sluggish coronary flow/   NSTEMI --11-21-2016 per cardiac  cath occluded proximal RCA & mid to diastal CFX 100%, med rx. 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   Chronic diastolic heart failure ([place])    CKD (chronic kidney disease), stage III ([place])    patient unaware   DOE (dyspnea on exertion)    2-3 flights of stairs   Dyspnea    With exertion   Fatty liver    pt denies   Hematuria 02/2019   History of COVID-19 10/2019   History of non-ST elevation myocardial infarction (NSTEMI)    02-04-2014  and 11-21-2016  cardiac cath done both times ,  medically management   History of shingles 12/2017   slight pain and numbness still noted in the area   Hyperlipidemia    Hypertension    Insomnia    Myocardial infarction ([place]) 2015   Persistent atrial fibrillation [facility])    cardiologsit-- dr hochrein   Sciatica    Thoracic aortic atherosclerosis ([place])    Type 2 diabetes mellitus ([place])    Urinary frequency     Past Surgical History:  Procedure Laterality Date   CARDIAC CATHETERIZATION N/A 11/21/2016   Procedure: Left Heart Cath and Coronary Angiography;  Surgeon: Troy Sine, MD;  Location: [place] [facility];  Service: Cardiovascular;  Laterality: N/A;  pRCA 100% , ostial LAD 45%, OM3  80%, mCFX to dCFX 100% (AV groove), lateral OM3 50%   COLONOSCOPY     CYSTOSCOPY WITH BIOPSY N/A 08/12/2020   Procedure: CYSTOSCOPY WITH BLADDER BIOPSY AND TRANSURETHRAL RESECTION OF BLADDER TUMOR;  Surgeon: [name], MD;  Location: [facility];  Service: Urology;  Laterality: N/A;   CYSTOSCOPY WITH URETEROSCOPY AND STENT PLACEMENT Right 12/15/2019   Procedure: CYSTOSCOPY WITH RIGHT RETROGRADE/ RIGHT URETEROSCOPY/ BIOPSY;  Surgeon: [name], MD;  Location: [place];  Service: Urology;  Laterality: Right;   CYSTOSCOPY/RETROGRADE/URETEROSCOPY Bilateral 03/18/2018   Procedure: CYSTOSCOPY/RETROGRADE/URETEROSCOPY.;  Surgeon: [name], MD;  Location: [facility];  Service: Urology;  Laterality:  Bilateral;   KIDNEY SURGERY     KNEE ARTHROSCOPY Right    LEFT HEART CATHETERIZATION WITH CORONARY ANGIOGRAM N/A 02/04/2014   Procedure: LEFT HEART CATHETERIZATION WITH CORONARY ANGIOGRAM;  Surgeon: [name], MD;  Location: [place] [facility];  Service: Cardiovascular;  Laterality: N/A;  severe one-vessel CAD, chronically occluded RCA with faint left-to-right collaterals;  aneurysmal LCFx with sluggish coronary flow;  normal LVSF w/ moderately elevated LVEDP (ostialOM2 20%, pOM3 20%, pD1 20%, mCFX 50%, diffuse 20% pCFX)   PROSTATE BIOPSY N/A 08/12/2020   Procedure: BIOPSY TRANSRECTAL ULTRASONIC PROSTATE (TUBP);  Surgeon: [name], MD;  Location[facility];  Service: Urology;  Laterality: N/A;   ROBOT ASSITED LAPAROSCOPIC NEPHROURETERECTOMY Right 03/22/2020   Procedure: XI ROBOT ASSITED LAPAROSCOPIC NEPHROURETERECTOMY;  Surgeon: [name], MD;  Location: [facility];  Service: Urology;  Laterality: Right;   TRANSTHORACIC ECHOCARDIOGRAM  11-22-2016   dr hochrein   ef 55-60%/ mild MR and TR/ moderate LAE   TRANSURETHRAL RESECTION OF BLADDER TUMOR N/A 02/10/2021   Procedure: TRANSURETHRAL RESECTION OF BLADDER TUMOR (TURBT)/ CYSTOSCOPY/ LEFT RETROGRADE;  Surgeon: [name], MD;  Location: [facility];  Service: Urology;  Laterality: N/A;  GENERAL ANESTHESIA WITH PARALYSIS    Social History   Socioeconomic History   Marital status: Married    Spouse name: Not on file   Number of children: Not on file   Years of education: Not on file   Highest education level: Not on file  Occupational History   Not on file  Tobacco Use   Smoking status: Never   Smokeless tobacco: Former    Types: Nurse, children's Use: Never used  Substance and Sexual Activity   Alcohol use: No   Drug use: No   Sexual activity: Yes  Other Topics Concern   Not on file  Social History Narrative   Not on file   Social Determinants of Health   Financial Resource Strain: Low Risk    Difficulty of Paying  Living Expenses: Not hard at all  Food Insecurity: No Food Insecurity   Worried About Charity fundraiser in the Last Year: Never true   [place] in the Last Year: Never true  Transportation Needs: No Transportation Needs   Lack of Transportation (Medical): No   Lack of Transportation (Non-Medical): No  Physical Activity: Insufficiently Active   Days of Exercise per Week: 3 days   Minutes of Exercise per Session: 30 min  Stress: No Stress Concern Present   Feeling of Stress : Not at all  Social Connections: Moderately Integrated   Frequency of Communication with Friends and Family: More than three times a week   Frequency of Social Gatherings with Friends and Family: Three times a week   Attends Religious Services: 1 to 4 times per year  Active Member of Clubs or Organizations: No   Attends Archivist Meetings: Never   Marital Status: Married  Human resources officer Violence: Not At Risk   Fear of Current or Ex-Partner: No   Emotionally Abused: No   Physically Abused: No   Sexually Abused: No    Family History  Problem Relation Age of Onset   Hypertension Mother    Cancer Mother        anal cancer   Esophageal cancer Neg Hx    Pancreatic cancer Neg Hx    Stomach cancer Neg Hx    Liver disease Neg Hx      Immunization History  Administered Date(s) Administered   DTaP 12/24/2014   Fluad Quad(high Dose 65+) 09/04/2019, 12/17/2020   Influenza, High Dose Seasonal PF 08/23/2018   Influenza,inj,Quad PF,6+ Mos 07/27/2014   Influenza-Unspecified 07/27/2014   Pneumococcal Conjugate-13 05/22/2016   Pneumococcal Polysaccharide-23 02/03/2014, 12/24/2014   Tdap 02/03/2014, 12/24/2014, 07/07/2018    Outpatient Encounter Medications as of 06/10/2021  Medication Sig   amLODipine (NORVASC) 5 MG tablet Take 1 tablet (5 mg total) by mouth daily.   apixaban (ELIQUIS) 5 MG TABS tablet Take 5 mg by mouth 2 (two) times daily.   diphenhydramine-acetaminophen (TYLENOL PM) 25-500  MG TABS tablet Take 0.5 tablets by mouth at bedtime as needed (sleep).   furosemide (LASIX) 20 MG tablet Take 1 tablet (20 mg total) by mouth daily as needed for edema.   isosorbide mononitrate (IMDUR) 30 MG 24 hr tablet Take 1 tablet (30 mg total) by mouth daily.   metoprolol tartrate (LOPRESSOR) 25 MG tablet TAKE ONE TABLET BY MOUTH TWICE A DAY   nitroGLYCERIN (NITROSTAT) 0.4 MG SL tablet PLACE ONE TABLET UNDER THE TONGUE EVERY 5 MINUTES TIMES THREE DOSES AS NEEDED FOR CHEST PAIN. CALL 911 IF 2ND DOSE DOES NOT HELP   olmesartan (BENICAR) 20 MG tablet Take 1 tablet (20 mg total) by mouth daily.   rosuvastatin (CRESTOR) 40 MG tablet Take 1 tablet (40 mg total) by mouth every morning.   sitaGLIPtin (JANUVIA) 100 MG tablet Take 1 tablet (100 mg total) by mouth daily.   levofloxacin (LEVAQUIN) 250 MG tablet Take 250 mg by mouth daily. (Patient not taking: Reported on 06/10/2021)   Facility-Administered Encounter Medications as of 06/10/2021  Medication   sodium phosphate (FLEET) 7-19 GM/118ML enema 1 enema     ROS: Pertinent positives and negatives noted in HPI. Remainder of ROS non-contributory    Allergies  Allergen Reactions   Xarelto [Rivaroxaban] Other (See Comments)    Skin Blisters     BP 138/90   Pulse 62   Temp (!) 97.5 F (36.4 C) (Temporal)   Wt 218 lb 12.8 oz (99.2 kg)   SpO2 97%   BMI 30.52 kg/m  Wt Readings from Last 3 Encounters:  06/10/21 218 lb 12.8 oz (99.2 kg)  04/22/21 214 lb (97.1 kg)  04/06/21 214 lb 9.6 oz (97.3 kg)   Temp Readings from Last 3 Encounters:  06/10/21 (!) 97.5 F (36.4 C) (Temporal)  04/22/21 (!) 96.6 F (35.9 C)  02/10/21 97.9 F (36.6 C)   BP Readings from Last 3 Encounters:  06/10/21 138/90  04/22/21 118/73  04/06/21 120/66   Pulse Readings from Last 3 Encounters:  06/10/21 62  04/22/21 65  04/06/21 76     Physical Exam Constitutional:      General: He is not in acute distress.    Appearance: He is not ill-appearing.   Cardiovascular:  Rate and Rhythm: Normal rate and regular rhythm.  Pulmonary:     Effort: Pulmonary effort is normal.     Breath sounds: Normal breath sounds. No wheezing, rhonchi or rales.  Musculoskeletal:     Right lower leg: Edema present.     Left lower leg: Edema (trace B/L pedal edema) present.  Neurological:     Mental Status: He is alert and oriented to person, place, and time.  Psychiatric:        Behavior: Behavior normal.     A/P:  1. Bilateral lower extremity edema 2. Chronic diastolic CHF (congestive heart failure), NYHA class 2 (HCC) - trace pedal edema, no other symptoms - pt to take lasix 40mg  daily x 2-3 days then stop lasix -- if no/minimal improvement or if symptoms return or worsen, pt should f/u with new PCP or cardio. Would recommend echo and possible med adjustment  3. Type 2 diabetes mellitus with stage 3a chronic kidney disease, without long-term current use of insulin (HCC) Lab Results  Component Value Date   HGBA1C 7.0 (H) 12/17/2020   - nephro recommends decreasing to 25mg  d/t CKD - will decreased januiva from 100mg  to 50mg  at this time with ultimate goal of reduction to 25mg  - pt to f/u in 2-7mo for A1C, will check BS at home and f/u sooner PRN - may need to add med d/t januiva decrease but will determine based on A1C result at that time Decrease: - sitaGLIPtin (JANUVIA) 100 MG tablet; Take 0.5 tablets (50 mg total) by mouth daily.  Dispense: 90 tablet; Refill: 3 - f/u in 2-3 mo with Dr. Gena Fray   This visit occurred during the SARS-CoV-2 public health emergency.  Safety protocols were in place, including screening questions prior to the visit, additional usage of staff PPE, and extensive cleaning of exam room while observing appropriate contact time as indicated for disinfecting solutions.

## 2021-06-10 NOTE — Patient Instructions (Addendum)
Increase furosemide/lasix to 40mg  (2 tabs) daily x 2-3 days then stop lasix If not change or if symptoms return, schedule follow-up with new PCP or cardio  For your sitagliptan/januvia, take 1/2 tab (50mg ) daily - this is decreased from 100mg  at recommendation of nephrologist. Goal is to get to 25mg  daily.  Follow-up in 2-71mo We may need to add a medication to help your blood sugar since you'll be on less [place]

## 2021-06-11 ENCOUNTER — Other Ambulatory Visit: Payer: Self-pay | Admitting: Cardiology

## 2021-06-23 DIAGNOSIS — C678 Malignant neoplasm of overlapping sites of bladder: Secondary | ICD-10-CM | POA: Diagnosis not present

## 2021-06-23 DIAGNOSIS — Z5111 Encounter for antineoplastic chemotherapy: Secondary | ICD-10-CM | POA: Diagnosis not present

## 2021-06-30 DIAGNOSIS — Z5111 Encounter for antineoplastic chemotherapy: Secondary | ICD-10-CM | POA: Diagnosis not present

## 2021-06-30 DIAGNOSIS — C678 Malignant neoplasm of overlapping sites of bladder: Secondary | ICD-10-CM | POA: Diagnosis not present

## 2021-07-08 DIAGNOSIS — C678 Malignant neoplasm of overlapping sites of bladder: Secondary | ICD-10-CM | POA: Diagnosis not present

## 2021-07-08 DIAGNOSIS — Z5111 Encounter for antineoplastic chemotherapy: Secondary | ICD-10-CM | POA: Diagnosis not present

## 2021-08-11 DIAGNOSIS — N184 Chronic kidney disease, stage 4 (severe): Secondary | ICD-10-CM | POA: Diagnosis not present

## 2021-08-11 DIAGNOSIS — Z905 Acquired absence of kidney: Secondary | ICD-10-CM | POA: Diagnosis not present

## 2021-08-11 DIAGNOSIS — E1122 Type 2 diabetes mellitus with diabetic chronic kidney disease: Secondary | ICD-10-CM | POA: Diagnosis not present

## 2021-08-11 DIAGNOSIS — I129 Hypertensive chronic kidney disease with stage 1 through stage 4 chronic kidney disease, or unspecified chronic kidney disease: Secondary | ICD-10-CM | POA: Diagnosis not present

## 2021-08-19 ENCOUNTER — Other Ambulatory Visit: Payer: Self-pay | Admitting: Cardiology

## 2021-09-27 ENCOUNTER — Ambulatory Visit (INDEPENDENT_AMBULATORY_CARE_PROVIDER_SITE_OTHER): Payer: Medicare Other

## 2021-09-27 ENCOUNTER — Other Ambulatory Visit: Payer: Self-pay

## 2021-09-27 DIAGNOSIS — Z23 Encounter for immunization: Secondary | ICD-10-CM | POA: Diagnosis not present

## 2021-09-27 NOTE — Progress Notes (Addendum)
Per the orders of NP Wilfred Lacy pt is here for flu vaccine high dose, received at 10:35 am given by [place] CMA/CPT. Pt tolerated vaccine well.

## 2021-09-28 DIAGNOSIS — R311 Benign essential microscopic hematuria: Secondary | ICD-10-CM | POA: Diagnosis not present

## 2021-09-28 DIAGNOSIS — D090 Carcinoma in situ of bladder: Secondary | ICD-10-CM | POA: Diagnosis not present

## 2021-09-30 ENCOUNTER — Encounter: Payer: Self-pay | Admitting: Family Medicine

## 2021-09-30 DIAGNOSIS — D090 Carcinoma in situ of bladder: Secondary | ICD-10-CM | POA: Insufficient documentation

## 2021-09-30 DIAGNOSIS — C679 Malignant neoplasm of bladder, unspecified: Secondary | ICD-10-CM | POA: Insufficient documentation

## 2021-10-05 DIAGNOSIS — H43811 Vitreous degeneration, right eye: Secondary | ICD-10-CM | POA: Diagnosis not present

## 2021-10-05 DIAGNOSIS — H35371 Puckering of macula, right eye: Secondary | ICD-10-CM | POA: Diagnosis not present

## 2021-10-05 DIAGNOSIS — H25812 Combined forms of age-related cataract, left eye: Secondary | ICD-10-CM | POA: Diagnosis not present

## 2021-10-05 DIAGNOSIS — E119 Type 2 diabetes mellitus without complications: Secondary | ICD-10-CM | POA: Diagnosis not present

## 2021-10-17 DIAGNOSIS — I129 Hypertensive chronic kidney disease with stage 1 through stage 4 chronic kidney disease, or unspecified chronic kidney disease: Secondary | ICD-10-CM | POA: Diagnosis not present

## 2021-10-17 DIAGNOSIS — N184 Chronic kidney disease, stage 4 (severe): Secondary | ICD-10-CM | POA: Diagnosis not present

## 2021-10-17 DIAGNOSIS — E1122 Type 2 diabetes mellitus with diabetic chronic kidney disease: Secondary | ICD-10-CM | POA: Diagnosis not present

## 2021-10-17 DIAGNOSIS — Z905 Acquired absence of kidney: Secondary | ICD-10-CM | POA: Diagnosis not present

## 2021-10-26 ENCOUNTER — Encounter: Payer: Self-pay | Admitting: Family Medicine

## 2021-10-26 DIAGNOSIS — I129 Hypertensive chronic kidney disease with stage 1 through stage 4 chronic kidney disease, or unspecified chronic kidney disease: Secondary | ICD-10-CM | POA: Insufficient documentation

## 2021-10-26 DIAGNOSIS — Z905 Acquired absence of kidney: Secondary | ICD-10-CM | POA: Insufficient documentation

## 2021-10-27 ENCOUNTER — Telehealth: Payer: Self-pay | Admitting: Cardiology

## 2021-10-27 ENCOUNTER — Other Ambulatory Visit: Payer: Self-pay | Admitting: Urology

## 2021-10-27 NOTE — Telephone Encounter (Signed)
Will route to pharmD then pt will need call. Last OV 01/2021. Cleared for another surgery at that time.

## 2021-10-27 NOTE — Telephone Encounter (Signed)
° °  [facility] Pre-operative Risk Assessment    Request for surgical clearance:  What type of surgery is being performed? Cystoscopy with bladder biopsy    When is this surgery scheduled? 12/29   What type of clearance is required (medical clearance vs. Pharmacy clearance to hold med vs. Both)? Pharmacy   Are there any medications that need to be held prior to surgery and how long?apixaban (ELIQUIS) 5 MG TABS tablet 48hrs prior    Practice name and name of physician performing surgery?Dr. Wynetta Emery borden    What is your office phone number [phone] ext [phone]   7.   What is your office fax number [phone]  8.   Anesthesia type (None, local, MAC, general) ? General    Adam Harris 10/27/2021, 3:15 PM  _________________________________________________________________   (provider comments below)

## 2021-10-28 NOTE — Telephone Encounter (Signed)
Await for clinical pharmacist recommendation

## 2021-10-31 NOTE — Telephone Encounter (Signed)
Primary Cardiologist:James Hochrein, MD  Chart reviewed as part of pre-operative protocol coverage. Because of Adam Harris's past medical history and time since last visit, he/she will require a follow-up visit in order to better assess preoperative cardiovascular risk.  Pre-op covering staff: - Please schedule appointment and call patient to inform them. - Please contact requesting surgeon's office via preferred method (i.e, phone, fax) to inform them of need for appointment prior to surgery.  If applicable, this message will also be routed to pharmacy pool and/or primary cardiologist for input on holding anticoagulant/antiplatelet agent as requested below so that this information is available at time of patient's appointment.   [name], NP  10/31/2021, 2:18 PM

## 2021-10-31 NOTE — Patient Instructions (Addendum)
DUE TO COVID-19 ONLY ONE VISITOR IS ALLOWED TO COME WITH YOU AND STAY IN THE WAITING ROOM ONLY DURING PRE OP AND PROCEDURE DAY OF SURGERY IF YOU ARE GOING HOME AFTER SURGERY. IF YOU ARE SPENDING THE NIGHT 2 PEOPLE MAY VISIT WITH YOU IN YOUR PRIVATE ROOM AFTER SURGERY UNTIL VISITING  HOURS ARE OVER AT 800 PM AND 1  VISITOR  MAY  SPEND THE NIGHT.                 Adam Harris     Your procedure is scheduled on: 11/10/21   Report to [facility] Main  Entrance   Report to admitting at   2:00 PM     Call this number if you have problems the morning of surgery [phone]    Remember: Do not eat food  :After Midnight the night before your surgery,   You may have clear liquids from midnight until 10:00 am    CLEAR LIQUID DIET   Foods Allowed                                                                     Foods Excluded  Coffee and tea, regular and decaf                             liquids that you cannot  Plain Jell-O any favor except red or purple                                           see through such as: Fruit ices (not with fruit pulp)                                     milk, soups, orange juice  Iced Popsicles                                    All solid food Carbonated beverages, regular and diet                                    Cranberry, grape and apple juices Sports drinks like Gatorade Lightly seasoned clear broth or consume(fat free) Sugar  BRUSH YOUR TEETH MORNING OF SURGERY AND RINSE YOUR MOUTH OUT, NO CHEWING GUM CANDY OR MINTS.     Take these medicines the morning of surgery with A SIP OF WATER: Imdur, Metoprolol, Amlodipine  Stop taking _Eliquis__________on _2/26_________as instructed by __Borden___________.      DO NOT TAKE ANY DIABETIC MEDICATIONS DAY OF YOUR SURGERYHow to Manage Your Diabetes Before and After Surgery  Why is it important to control my blood sugar before and after surgery? Improving blood sugar levels before and after  surgery helps healing and can limit problems. A way of improving blood sugar control is eating a healthy diet by:  Eating less sugar and carbohydrates  Increasing activity/exercise  Talking with your doctor about reaching your blood sugar goals High blood sugars (greater than 180 mg/dL) can raise your risk of infections and slow your recovery, so you will need to focus on controlling your diabetes during the weeks before surgery. Make sure that the doctor who takes care of your diabetes knows about your planned surgery including the date and location.  How do I manage my blood sugar before surgery? Check your blood sugar at least 4 times a day, starting 2 days before surgery, to make sure that the level is not too high or low. Check your blood sugar the morning of your surgery when you wake up and every 2 hours until you get to the Short Stay unit. If your blood sugar is less than 70 mg/dL, you will need to treat for low blood sugar: Do not take insulin. Treat a low blood sugar (less than 70 mg/dL) with  cup of clear juice (cranberry or apple), 4 glucose tablets, OR glucose gel. Recheck blood sugar in 15 minutes after treatment (to make sure it is greater than 70 mg/dL). If your blood sugar is not greater than 70 mg/dL on recheck, call [phone] for further instructions. Report your blood sugar to the short stay nurse when you get to Short Stay.  If you are admitted to the hospital after surgery: Your blood sugar will be checked by the staff and you will probably be given insulin after surgery (instead of oral diabetes medicines) to make sure you have good blood sugar levels. The goal for blood sugar control after surgery is 80-180 mg/dL.   WHAT DO I DO ABOUT MY DIABETES MEDICATION?   Do not take your Januvia the day before surgery  Do not take oral diabetes medicines (pills) the morning of surgery.                                 You may not have any metal on your body including               piercings  Do not wear jewelry, lotions, powders or  deodorant             Men may shave face and neck.   Do not bring valuables to the hospital. [place].  Contacts, dentures or bridgework may not be worn into surgery.     Patients discharged the day of surgery will not be allowed to drive home.  IF YOU ARE HAVING SURGERY AND GOING HOME THE SAME DAY, YOU MUST HAVE AN ADULT TO DRIVE YOU HOME AND BE WITH YOU FOR 24 HOURS. YOU MAY GO HOME BY TAXI OR UBER OR ORTHERWISE, BUT AN ADULT MUST ACCOMPANY YOU HOME AND STAY WITH YOU FOR 24 HOURS.  Name and phone number of your driver:  Special Instructions: N/A              Please read over the following fact sheets you were given: _____________________________________________________________________             [facility] - Preparing for Surgery Before surgery, you can play an important role.  Because skin is not sterile, your skin needs to be as free of germs as possible.  You can reduce the number of germs on your skin by washing with CHG (chlorahexidine gluconate) soap before surgery.  CHG is an antiseptic cleaner which kills germs and bonds with the skin to continue killing germs even after washing. Please DO NOT use if you have an allergy to CHG or antibacterial soaps.  If your skin becomes reddened/irritated stop using the CHG and inform your nurse when you arrive at Short Stay..  You may shave your face/neck. Please follow these instructions carefully:  1.  Shower with CHG Soap the night before surgery and the  morning of Surgery.  2.  If you choose to wash your hair, wash your hair first as usual with your  normal  shampoo.  3.  After you shampoo, rinse your hair and body thoroughly to remove the  shampoo.                            4.  Use CHG as you would any other liquid soap.  You can apply chg directly  to the skin and wash                       Gently with a scrungie or  clean washcloth.  5.  Apply the CHG Soap to your body ONLY FROM THE NECK DOWN.   Do not use on face/ open                           Wound or open sores. Avoid contact with eyes, ears mouth and genitals (private parts).                       Wash face,  Genitals (private parts) with your normal soap.             6.  Wash thoroughly, paying special attention to the area where your surgery  will be performed.  7.  Thoroughly rinse your body with warm water from the neck down.  8.  DO NOT shower/wash with your normal soap after using and rinsing off  the CHG Soap.                9.  Pat yourself dry with a clean towel.            10.  Wear clean pajamas.            11.  Place clean sheets on your bed the night of your first shower and do not  sleep with pets. Day of Surgery : Do not apply any lotions/deodorants the morning of surgery.  Please wear clean clothes to the hospital/surgery center.  FAILURE TO FOLLOW THESE INSTRUCTIONS MAY RESULT IN THE CANCELLATION OF YOUR SURGERY PATIENT SIGNATURE_________________________________  NURSE SIGNATURE__________________________________  ________________________________________________________________________

## 2021-10-31 NOTE — Telephone Encounter (Signed)
Patient with diagnosis of atrial fibrillation on Eliquis for anticoagulation.    Procedure: cytoscopy w/ bladder biopsy Date of procedure: 11/10/21   CHA2DS2-VASc Score = 5   This indicates a 7.2% annual risk of stroke. The patient's score is based upon: CHF History: 1 HTN History: 1 Diabetes History: 1 Stroke History: 0 Vascular Disease History: 1 Age Score: 1 Gender Score: 0    CrCl 32  Platelet count 143  Per office protocol, patient can hold Eliquis for 2 days prior to procedure.   Patient will not need bridging with Lovenox (enoxaparin) around procedure.

## 2021-10-31 NOTE — Telephone Encounter (Signed)
Pt has appt with DOD Dr. Sallyanne Kuster 11/03/21. I will send FYI to requesting office pt has appt 11/03/21.

## 2021-11-02 ENCOUNTER — Encounter (HOSPITAL_COMMUNITY)
Admission: RE | Admit: 2021-11-02 | Discharge: 2021-11-02 | Disposition: A | Discharge status: Home / Self Care | Payer: Medicare Other | Source: Ambulatory Visit | Attending: Urology | Admitting: Urology

## 2021-11-02 ENCOUNTER — Encounter (HOSPITAL_COMMUNITY): Payer: Self-pay

## 2021-11-02 ENCOUNTER — Other Ambulatory Visit: Payer: Self-pay

## 2021-11-02 VITALS — BP 161/97 | HR 106 | Temp 98.2°F | Resp 20 | Ht 71.0 in | Wt 217.0 lb

## 2021-11-02 DIAGNOSIS — I4891 Unspecified atrial fibrillation: Secondary | ICD-10-CM | POA: Insufficient documentation

## 2021-11-02 DIAGNOSIS — Z87891 Personal history of nicotine dependence: Secondary | ICD-10-CM | POA: Diagnosis not present

## 2021-11-02 DIAGNOSIS — E1122 Type 2 diabetes mellitus with diabetic chronic kidney disease: Secondary | ICD-10-CM | POA: Diagnosis not present

## 2021-11-02 DIAGNOSIS — N1832 Chronic kidney disease, stage 3b: Secondary | ICD-10-CM

## 2021-11-02 DIAGNOSIS — C679 Malignant neoplasm of bladder, unspecified: Secondary | ICD-10-CM | POA: Insufficient documentation

## 2021-11-02 DIAGNOSIS — N184 Chronic kidney disease, stage 4 (severe): Secondary | ICD-10-CM | POA: Insufficient documentation

## 2021-11-02 DIAGNOSIS — I2510 Atherosclerotic heart disease of native coronary artery without angina pectoris: Secondary | ICD-10-CM | POA: Insufficient documentation

## 2021-11-02 DIAGNOSIS — Z7901 Long term (current) use of anticoagulants: Secondary | ICD-10-CM | POA: Diagnosis not present

## 2021-11-02 DIAGNOSIS — Z01812 Encounter for preprocedural laboratory examination: Secondary | ICD-10-CM | POA: Diagnosis not present

## 2021-11-02 DIAGNOSIS — I129 Hypertensive chronic kidney disease with stage 1 through stage 4 chronic kidney disease, or unspecified chronic kidney disease: Secondary | ICD-10-CM | POA: Diagnosis not present

## 2021-11-02 LAB — BASIC METABOLIC PANEL
Anion gap: 7 (ref 5–15)
BUN: 47 mg/dL — ABNORMAL HIGH (ref 8–23)
CO2: 24 mmol/L (ref 22–32)
Calcium: 9.2 mg/dL (ref 8.9–10.3)
Chloride: 109 mmol/L (ref 98–111)
Creatinine, Ser: 2.38 mg/dL — ABNORMAL HIGH (ref 0.61–1.24)
GFR, Estimated: 28 mL/min — ABNORMAL LOW (ref >60)
Glucose, Bld: 117 mg/dL — ABNORMAL HIGH (ref 70–99)
Potassium: 4.8 mmol/L (ref 3.5–5.1)
Sodium: 140 mmol/L (ref 135–145)

## 2021-11-02 LAB — CBC
HCT: 48.4 % (ref 39.0–52.0)
Hemoglobin: 15.5 g/dL (ref 13.0–17.0)
MCH: 29 pg (ref 26.0–34.0)
MCHC: 32 g/dL (ref 30.0–36.0)
MCV: 90.5 fL (ref 80.0–100.0)
Platelets: 149 10*3/uL — ABNORMAL LOW (ref 150–400)
RBC: 5.35 MIL/uL (ref 4.22–5.81)
RDW: 14.1 % (ref 11.5–15.5)
WBC: 8.8 10*3/uL (ref 4.0–10.5)
nRBC: 0 % (ref 0.0–0.2)

## 2021-11-02 LAB — HEMOGLOBIN A1C
Hgb A1c MFr Bld: 6.8 % — ABNORMAL HIGH (ref 4.8–5.6)
Mean Plasma Glucose: 148.46 mg/dL

## 2021-11-02 LAB — GLUCOSE, CAPILLARY: Glucose-Capillary: 140 mg/dL — ABNORMAL HIGH (ref 70–99)

## 2021-11-02 NOTE — Progress Notes (Signed)
COVID test- NA   PCP - Dr. Juliane Lack Cardiologist - Dr. Vita Barley  Chest x-ray - no EKG - 02/08/21-epic Stress Test - no ECHO - 2018-epic Cardiac Cath - 2015,2018 Pacemaker/ICD device last checked:NA  Sleep Study - no CPAP -   Fasting Blood Sugar - Pt doesn't test Checks Blood Sugar _____ times a day  Blood Thinner Instructions:Eliquis/ Dr. Percival Spanish Aspirin Instructions:Stop 2 days prior to DOS/ Dr. [name] Last Dose:11/07/21  Anesthesia review: yes  Patient denies shortness of breath, fever, cough and chest pain at PAT appointment Pt can climb 2 flight of stairs do housework and ADLs without SOB.  Patient verbalized understanding of instructions that were given to them at the PAT appointment. Patient was also instructed that they will need to review over the PAT instructions again at home before surgery. yes

## 2021-11-03 ENCOUNTER — Encounter: Payer: Self-pay | Admitting: Cardiovascular Disease

## 2021-11-03 ENCOUNTER — Ambulatory Visit (INDEPENDENT_AMBULATORY_CARE_PROVIDER_SITE_OTHER): Payer: Medicare Other | Admitting: Cardiovascular Disease

## 2021-11-03 VITALS — BP 138/86 | HR 68 | Ht 71.0 in | Wt 222.0 lb

## 2021-11-03 DIAGNOSIS — I2510 Atherosclerotic heart disease of native coronary artery without angina pectoris: Secondary | ICD-10-CM | POA: Diagnosis not present

## 2021-11-03 NOTE — Progress Notes (Signed)
Cardiology Office Note:    Date:  11/03/2021   ID:  Adam Harris, DOB 1946-07-12, MRN [id number]  PCP:  Haydee Salter, MD   [facility] Prov[facility] Cardiologist:  Minus Breeding, MD     Referring MD: Haydee Salter, MD   Chief Complaint  Patient presents with   Pre-op Exam         History of Present Illness:    Adam Harris is a 75 y.o. 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  He was seen by Dr. Percival Harris in March of this year for similar reason.  He has not had any change in his clinical status since then.  He has not had any angina at rest or with activity.  He can climb up to 2 or 3 flights of stairs before he becomes short of breath.  He does not have palpitations and denies dizziness, syncope, falls or serious bleeding problems.  He does have intermittent hematuria.  Denies edema, orthopnea and PND.  Past Medical History:  Diagnosis Date   Abnormal radiologic findings on diagnostic imaging of renal pelvis, ureter, or bladder    bilateral ureter abnormalities   Anticoagulant long-term use    eliquis   Anxiety    Arthritis    CAD (coronary artery disease) cardiologist-- dr hochrein   NSTEMI 02-04-2014  per cardiac cath chronic occluded RCA w/ faint left-to-right collaterals and aneurysmal LCFx with sluggish coronary flow/   NSTEMI --11-21-2016 per cardiac cath occluded proximal RCA & mid to diastal CFX 100%, med rx. If that does not work, PTCA or CABG   Cancer [facility])    bladder and kidney   CKD (chronic kidney disease), stage III ([place])    patient unaware   DOE (dyspnea on exertion)    2-3 flights of stairs   Fatty liver    pt denies    Hematuria 02/2019   History of COVID-19 10/2019   History of non-ST elevation myocardial infarction (NSTEMI)    02-04-2014  and 11-21-2016  cardiac cath done both times ,  medically management   History of shingles 12/2017   slight pain and numbness still noted in the area   Hyperlipidemia    Hypertension    Insomnia    Myocardial infarction [facility]) 2015   Persistent atrial fibrillation [facility])    cardiologsit-- dr hochrein   Thoracic aortic atherosclerosis ([place])    Type 2 diabetes mellitus ([place])    Urinary frequency     Past Surgical History:  Procedure Laterality Date   CARDIAC CATHETERIZATION N/A 11/21/2016   Procedure: Left Heart Cath and Coronary Angiography;  Surgeon: Troy Sine, MD;  Location: [place] CV LAB;  Service: Cardiovascular;  Laterality: N/A;  pRCA 100% , ostial LAD 45%, OM3 80%, mCFX to dCFX 100% (AV groove), lateral OM3 50%   COLONOSCOPY     CYSTOSCOPY WITH BIOPSY N/A 08/12/2020   Procedure: CYSTOSCOPY WITH BLADDER BIOPSY AND TRANSURETHRAL RESECTION OF BLADDER TUMOR;  Surgeon: [name], MD;  [facility];  Service: Urology;  Laterality: N/A;   CYSTOSCOPY WITH URETEROSCOPY AND STENT PLACEMENT Right 12/15/2019   Procedure: CYSTOSCOPY WITH RIGHT RETROGRADE/ RIGHT URETEROSCOPY/ BIOPSY;  Surgeon: [name], MD;  Location: [place];  Service:  Urology;  Laterality: Right;   CYSTOSCOPY/RETROGRADE/URETEROSCOPY Bilateral 03/18/2018   Procedure: CYSTOSCOPY/RETROGRADE/URETEROSCOPY.;  Surgeon: [name], MD;  Location[facility];  Service: Urology;  Laterality: Bilateral;   KNEE ARTHROSCOPY Right    LEFT HEART CATHETERIZATION WITH CORONARY ANGIOGRAM N/A 02/04/2014   Procedure: LEFT HEART CATHETERIZATION WITH CORONARY ANGIOGRAM;  Surgeon: [name], MD;  Location: [place] CATH LAB;  Service: Cardiovascular;  Laterality: N/A;  severe one-vessel CAD, chronically occluded RCA with faint left-to-right collaterals;  aneurysmal  LCFx with sluggish coronary flow;  normal LVSF w/ moderately elevated LVEDP (ostialOM2 20%, pOM3 20%, pD1 20%, mCFX 50%, diffuse 20% pCFX)   PROSTATE BIOPSY N/A 08/12/2020   Procedure: BIOPSY TRANSRECTAL ULTRASONIC PROSTATE (TUBP);  Surgeon: [name], MD;  [facility];  Service: Urology;  Laterality: N/A;   ROBOT ASSITED LAPAROSCOPIC NEPHROURETERECTOMY Right 03/22/2020   Procedure: XI ROBOT ASSITED LAPAROSCOPIC NEPHROURETERECTOMY;  Surgeon: [name], MD;  [facility];  Service: Urology;  Laterality: Right;   TRANSTHORACIC ECHOCARDIOGRAM  11-22-2016   dr hochrein   ef 55-60%/ mild MR and TR/ moderate LAE   TRANSURETHRAL RESECTION OF BLADDER TUMOR N/A 02/10/2021   Procedure: TRANSURETHRAL RESECTION OF BLADDER TUMOR (TURBT)/ CYSTOSCOPY/ LEFT RETROGRADE;  Surgeon: [name], MD;  [facility];  Service: Urology;  Laterality: N/A;  GENERAL ANESTHESIA WITH PARALYSIS    Current Medications: Current Meds  Medication Sig   amLODipine (NORVASC) 5 MG tablet Take 1 tablet (5 mg total) by mouth daily.   apixaban (ELIQUIS) 5 MG TABS tablet Take 5 mg by mouth 2 (two) times daily.   furosemide (LASIX) 20 MG tablet TAKE ONE TABLET BY MOUTH DAILY . MAY TAKE EXTRA TABLET IF NEEDED FOR SWELLING (Patient taking differently: Take 20 mg by mouth daily as needed for edema.)   isosorbide mononitrate (IMDUR) 30 MG 24 hr tablet Take 1 tablet (30 mg total) by mouth daily.   metoprolol tartrate (LOPRESSOR) 25 MG tablet TAKE ONE TABLET BY MOUTH TWICE A DAY   nitroGLYCERIN (NITROSTAT) 0.4 MG SL tablet PLACE ONE TABLET UNDER THE TONGUE EVERY 5 MINUTES TIMES THREE DOSES AS NEEDED FOR CHEST PAIN. CALL 911 IF 2ND DOSE DOES NOT HELP   olmesartan (BENICAR) 20 MG tablet Take 1 tablet (20 mg total) by mouth daily.   rosuvastatin (CRESTOR) 40 MG tablet Take 1 tablet (40 mg total) by mouth every morning.   sitaGLIPtin (JANUVIA) 100 MG tablet Take 0.5 tablets (50 mg total) by mouth daily.      Allergies:   Xarelto [rivaroxaban]   Social History   Socioeconomic History   Marital status: Married    Spouse name: Not on file   Number of children: Not on file   Years of education: Not on file   Highest education level: Not on file  Occupational History   Not on file  Tobacco Use   Smoking status: Former    Packs/day: 0.25    Years: 4.00    Pack years: 1.00    Types: Cigarettes    Quit date: 39    Years since quitting: 51.0   Smokeless tobacco: Former    Types: Chew    Quit date: 1972  Vaping Use   Vaping Use: Never used  Substance and Sexual Activity   Alcohol use: No   Drug use: No   Sexual activity: Yes  Other Topics Concern   Not on file  Social History Narrative   Not on file   Social Determinants of Health  Financial Resource Strain: Low Risk    Difficulty of Paying Living Expenses: Not hard at all  Food Insecurity: No Food Insecurity   Worried About Charity fundraiser in the Last Year: Never true   Ran Out of Food in the Last Year: Never true  Transportation Needs: No Transportation Needs   Lack of Transportation (Medical): No   Lack of Transportation (Non-Medical): No  Physical Activity: Insufficiently Active   Days of Exercise per Week: 3 days   Minutes of Exercise per Session: 30 min  Stress: No Stress Concern Present   Feeling of Stress : Not at all  Social Connections: Moderately Integrated   Frequency of Communication with Friends and Family: More than three times a week   Frequency of Social Gatherings with Friends and Family: Three times a week   Attends Religious Services: 1 to 4 times per year   Active Member of Clubs or Organizations: No   Attends Archivist Meetings: Never   Marital Status: Married     Family History: The patient's family history includes Cancer in his mother; Hypertension in his mother. There is no history of Esophageal cancer, Pancreatic cancer, Stomach cancer, or Liver disease.  ROS:   Please  see the history of present illness.     All other systems reviewed and are negative.  EKGs/Labs/Other Studies Reviewed:    The following studies were reviewed today: Echocardiogram 11/22/2016 - Left ventricle: The cavity size was normal. Systolic function was    normal. The estimated ejection fraction was in the range of 55%    to 60%. Wall motion was normal; there were no regional wall    motion abnormalities.  - Aortic valve: Trileaflet; moderately thickened, moderately    calcified leaflets.  - Mitral valve: There was mild regurgitation.  - Left atrium: The atrium was moderately dilated.  - Tricuspid valve: There was mild regurgitation.  - Pulmonary arteries: Systolic pressure was mildly increased. PA    peak pressure: 37 mm Hg (S).   Impressions:   - Compared to the prior study, there has been no significant    interval change.   Cardiac catheterization 11/21/2016 Prox RCA lesion, 100 %stenosed. Ost LAD lesion, 45 %stenosed. 3rd Mrg lesion, 80 %stenosed. Mid Cx to Dist Cx lesion, 100 %stenosed. Lat 3rd Mrg lesion, 50 %stenosed.   Multivessel CAD: The LAD had ostial 40 to less than 50% focal stenosis.  There were mild luminal irregularities in the LAD, which extended to the apex but otherwise was without additional significant obstructive disease.   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  The OM1 vessel had 80% stenosis before giving rise to a branch and then had 50% stenosis in the inferior limb.  The AV groove circumflex was 99/100% occluded immediately after the large aneurysmal segment and  there was faint filling of a very large an additional aneurysmal segment with thrombus and very faint filling of the distal posterolateral branches.  There was faint collateralization of the distal  posterolateral branches via the inferior branch of the obtuse marginal vessel.   The RCA was totally occluded proximally as had been previously demonstrated and there were faint bridging collaterals supplying the mid RCA which was a nondominant vessel distally.   LVEDP 18 mm Hg.   RECOMMENDATION: Angiographic findings were reviewed  with Dr. Tamala Julian.  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  His RCA was totally occluded at that time.  His RCA is essentially unchanged.  His AV groove circumflex is now occluded, which most likely is the culprit lesion.  The patient had been inadequately been taking his anticoagulation.  Heparin will be resumed tonight with plans to resume oral anticoagulation.  Initially, the patient will be treated with increasing medical therapy.  He is completely pain-free.  He will be started on nitrates, in addition to increased beta blocker therapy.  He is not a candidate for stenting due to the significant aneurysmal dilatations throughout his vessels.  If he develops increasing symptomatology, consider possible PTCA and/or evaluation for surgical revascularization.    EKG:  EKG is ordered today.  The ekg ordered today demonstrates relation with controlled rate, nonspecific ST-T wave changes most obvious in leads V6, 1, aVL, borderline QTC 465 ms  Recent Labs: 12/17/2020: ALT 11 11/02/2021: BUN 47; Creatinine, Ser 2.38; Hemoglobin 15.5; Platelets 149; Potassium 4.8; Sodium 140  Recent Lipid Panel    Component Value Date/Time   CHOL 114 12/17/2020 1138   CHOL 149 06/25/2019 1621   TRIG 43.0 12/17/2020 1138   HDL 39.30 12/17/2020 1138   HDL 43 06/25/2019 1621   CHOLHDL 3 12/17/2020 1138   VLDL 8.6 12/17/2020 1138   LDLCALC 66 12/17/2020 1138   LDLCALC 88 06/25/2019 1621     Risk Assessment/Calculations:    CHA2DS2-VASc Score = 5   This indicates a 7.2% annual risk of  stroke. The patient's score is based upon: CHF History: 1 HTN History: 1 Diabetes History: 1 Stroke History: 0 Vascular Disease History: 1 Age Score: 1 Gender Score: 0          Physical Exam:    VS:  BP 138/86    Pulse 68    Ht 5\' 11"  (1.803 m)    Wt 222 lb (100.7 kg)    SpO2 95%    BMI 30.96 kg/m     Wt Readings from Last 3 Encounters:  11/03/21 222 lb (100.7 kg)  11/02/21 217 lb (98.4 kg)  06/10/21 218 lb 12.8 oz (99.2 kg)     GEN: Appears well, well nourished, well developed in no acute distress HEENT: Normal NECK: No JVD; No carotid bruits LYMPHATICS: No lymphadenopathy CARDIAC: Irregular, no murmurs, rubs, gallops RESPIRATORY:  Clear to auscultation without rales, wheezing or rhonchi  ABDOMEN: Soft, non-tender, non-distended MUSCULOSKELETAL:  No edema; No deformity  SKIN: Warm and dry NEUROLOGIC:  Alert and oriented x 3 PSYCHIATRIC:  Normal affect   ASSESSMENT:    1. Coronary artery disease involving native coronary artery of native heart without angina pectoris    PLAN:    In order of problems listed above:  AFib: Adequate rate control.  On appropriate anticoagulation.  Asymptomatic. CAD: On the current medical regimen he does not have angina pectoris (on 3 antianginal medications).  He has not required invasive evaluation or revascularization in almost 5 years.  On beta-blocker and statin, not on aspirin due to full anticoagulation. HTN: Adequate control.  Blood pressure is usually lower than today. HLP: Small dose of highly active statin with lipid profile in desirable range. CKD 4: Acne and has been stable in the 2.2-2.4 range, GFR just under 30 is nephrectomy. Preop CV exam: Despite his many cardiovascular issues, he appears to be very well compensated,  NYHA functional class I, euvolemic.  Low risk for major cardiovascular complications with planned urological procedure.  Can stop the Eliquis for 3 days before surgery.           Medication  Adjustments/Labs and Tests Ordered: Current medicines are reviewed at length with the patient today.  Concerns regarding medicines are outlined above.  Orders Placed This Encounter  Procedures   EKG 12-Lead   No orders of the defined types were placed in this encounter.   Patient Instructions  Medication Instructions:  No changes *If you need a refill on your cardiac medications before your next appointment, please call your pharmacy*   Lab Work: None ordered If you have labs (blood work) drawn today and your tests are completely normal, you will receive your results only by: [place] (if you have MyChart) OR A paper copy in the mail If you have any lab test that is abnormal or we need to change your treatment, we will call you to review the results.   Testing/Procedures: None ordered   Follow-Up: At [facility], you and your health needs are our priority.  As part of our continuing mission to provide you with exceptional heart care, we have created designated Provider Care Teams.  These Care Teams include your primary Cardiologist (physician) and Advanced Practice Providers (APPs -  Physician Assistants and Nurse Practitioners) who all work together to provide you with the care you need, when you need it.  We recommend signing up for the patient portal called "MyChart".  Sign up information is provided on this After Visit Summary.  MyChart is used to connect with patients for Virtual Visits (Telemedicine).  Patients are able to view lab/test results, encounter notes, upcoming appointments, etc.  Non-urgent messages can be sent to your provider as well.   To learn more about what you can do with MyChart, go to [email or web address].    Your next appointment:   12 month(s)  The format for your next appointment:   In Person  Provider:   Minus Breeding, MD       Signed, Sanda Klein, MD  11/03/2021 5:22 PM    [place]

## 2021-11-03 NOTE — Patient Instructions (Signed)
Medication Instructions:  ?No changes ?*If you need a refill on your cardiac medications before your next appointment, please call your pharmacy* ? ? ?Lab Work: ?None ordered ?If you have labs (blood work) drawn today and your tests are completely normal, you will receive your results only by: ?MyChart Message (if you have MyChart) OR ?A paper copy in the mail ?If you have any lab test that is abnormal or we need to change your treatment, we will call you to review the results. ? ? ?Testing/Procedures: ?None ordered ? ? ?Follow-Up: ?At [facility], you and your health needs are our priority.  As part of our continuing mission to provide you with exceptional heart care, we have created designated Provider Care Teams.  These Care Teams include your primary Cardiologist (physician) and Advanced Practice Providers (APPs -  Physician Assistants and Nurse Practitioners) who all work together to provide you with the care you need, when you need it. ? ?We recommend signing up for the patient portal called "MyChart".  Sign up information is provided on this After Visit Summary.  MyChart is used to connect with patients for Virtual Visits (Telemedicine).  Patients are able to view lab/test results, encounter notes, upcoming appointments, etc.  Non-urgent messages can be sent to your provider as well.   ?To learn more about what you can do with MyChart, go to [email or web address].   ? ?Your next appointment:   ?12 month(s) ? ?The format for your next appointment:   ?In Person ? ?Provider:   ?James Hochrein, MD { ? ? ?

## 2021-11-03 NOTE — Progress Notes (Signed)
Anesthesia Chart Review   Case: [id number] Date/Time: 11/10/21 1603   Procedure: CYSTOSCOPY WITH BLADDER BIOPSIES/ LEFT RETROGRADE   Anesthesia type: General   Pre-op diagnosis: BLADDER CANCER   Location: [facility]   Surgeons: [name], MD       DISCUSSION:75 y.o. former smoker with h/o HTN, DM II, CAD, a-fib (Eliquis), CKD Stage IV, bladder cancer scheduled for above procedure 11/10/2021 with Dr. [name].   Pt seen by cardiology 11/03/21. Per OV note, "Preop CV exam: Despite his many cardiovascular issues, he appears to be very well compensated, NYHA functional class I, euvolemic.  Low risk for major cardiovascular complications with planned urological procedure.  Can stop the Eliquis for 3 days before surgery."  Anticipate pt can proceed with planned procedure barring acute status change.   VS: BP (!) 161/97    Pulse (!) 106    Temp 36.8 C (Oral)    Resp 20    Ht 5\' 11"  (1.803 m)    Wt 98.4 kg    SpO2 97%    BMI 30.27 kg/m   PROVIDERS: Haydee Salter, MD is PCP   Minus Breeding, MD is Cardiologist  LABS: Labs reviewed: Acceptable for surgery. (all labs ordered are listed, but only abnormal results are displayed)  Labs Reviewed  HEMOGLOBIN A1C - Abnormal; Notable for the following components:      Result Value   Hgb A1c MFr Bld 6.8 (*)    All other components within normal limits  BASIC METABOLIC PANEL - Abnormal; Notable for the following components:   Glucose, Bld 117 (*)    BUN 47 (*)    Creatinine, Ser 2.38 (*)    GFR, Estimated 28 (*)    All other components within normal limits  CBC - Abnormal; Notable for the following components:   Platelets 149 (*)    All other components within normal limits  GLUCOSE, CAPILLARY - Abnormal; Notable for the following components:   Glucose-Capillary 140 (*)    All other components within normal limits     IMAGES:   EKG: 02/08/2021 Rate 53 bpm  Atrial fibrillation with slow ventricular  response Incomplete right bundle branch block  ST & T wave abnormality, consider lateral ischemia   CV: Echo 11/22/2016 Study Conclusions   - Left ventricle: The cavity size was normal. Systolic function was    normal. The estimated ejection fraction was in the range of 55%    to 60%. Wall motion was normal; there were no regional wall    motion abnormalities.  - Aortic valve: Trileaflet; moderately thickened, moderately    calcified leaflets.  - Mitral valve: There was mild regurgitation.  - Left atrium: The atrium was moderately dilated.  - Tricuspid valve: There was mild regurgitation.  - Pulmonary arteries: Systolic pressure was mildly increased. PA    peak pressure: 37 mm Hg (S).  Past Medical History:  Diagnosis Date   Abnormal radiologic findings on diagnostic imaging of renal pelvis, ureter, or bladder    bilateral ureter abnormalities   Anticoagulant long-term use    eliquis   Anxiety    Arthritis    CAD (coronary artery disease) cardiologist-- dr hochrein   NSTEMI 02-04-2014  per cardiac cath chronic occluded RCA w/ faint left-to-right collaterals and aneurysmal LCFx with sluggish coronary flow/   NSTEMI --11-21-2016 per cardiac cath occluded proximal RCA & mid to diastal CFX 100%, med rx. If that does not work, PTCA or CABG  Cancer [facility])    bladder and kidney   CKD (chronic kidney disease), stage III ([place])    patient unaware   DOE (dyspnea on exertion)    2-3 flights of stairs   Fatty liver    pt denies   Hematuria 02/2019   History of COVID-19 10/2019   History of non-ST elevation myocardial infarction (NSTEMI)    02-04-2014  and 11-21-2016  cardiac cath done both times ,  medically management   History of shingles 12/2017   slight pain and numbness still noted in the area   Hyperlipidemia    Hypertension    Insomnia    Myocardial infarction [facility]) 2015   Persistent atrial fibrillation [facility])    cardiologsit-- dr hochrein   Thoracic aortic atherosclerosis  ([place])    Type 2 diabetes mellitus ([place])    Urinary frequency     Past Surgical History:  Procedure Laterality Date   CARDIAC CATHETERIZATION N/A 11/21/2016   Procedure: Left Heart Cath and Coronary Angiography;  Surgeon: Troy Sine, MD;  Location: [place] CV LAB;  Service: Cardiovascular;  Laterality: N/A;  pRCA 100% , ostial LAD 45%, OM3 80%, mCFX to dCFX 100% (AV groove), lateral OM3 50%   COLONOSCOPY     CYSTOSCOPY WITH BIOPSY N/A 08/12/2020   Procedure: CYSTOSCOPY WITH BLADDER BIOPSY AND TRANSURETHRAL RESECTION OF BLADDER TUMOR;  Surgeon: [name], MD;  [facility];  Service: Urology;  Laterality: N/A;   CYSTOSCOPY WITH URETEROSCOPY AND STENT PLACEMENT Right 12/15/2019   Procedure: CYSTOSCOPY WITH RIGHT RETROGRADE/ RIGHT URETEROSCOPY/ BIOPSY;  Surgeon: [name], MD;  Location: [place];  Service: Urology;  Laterality: Right;   CYSTOSCOPY/RETROGRADE/URETEROSCOPY Bilateral 03/18/2018   Procedure: CYSTOSCOPY/RETROGRADE/URETEROSCOPY.;  Surgeon: [name], MD;  Location: [facility];  Service: Urology;  Laterality: Bilateral;   KNEE ARTHROSCOPY Right    LEFT HEART CATHETERIZATION WITH CORONARY ANGIOGRAM N/A 02/04/2014   Procedure: LEFT HEART CATHETERIZATION WITH CORONARY ANGIOGRAM;  Surgeon: [name], MD;  Location: [place] CATH LAB;  Service: Cardiovascular;  Laterality: N/A;  severe one-vessel CAD, chronically occluded RCA with faint left-to-right collaterals;  aneurysmal LCFx with sluggish coronary flow;  normal LVSF w/ moderately elevated LVEDP (ostialOM2 20%, pOM3 20%, pD1 20%, mCFX 50%, diffuse 20% pCFX)   PROSTATE BIOPSY N/A 08/12/2020   Procedure: BIOPSY TRANSRECTAL ULTRASONIC PROSTATE (TUBP);  Surgeon: [name], MD;  [facility];  Service: Urology;  Laterality: N/A;   ROBOT ASSITED LAPAROSCOPIC NEPHROURETERECTOMY Right 03/22/2020   Procedure: XI ROBOT ASSITED LAPAROSCOPIC NEPHROURETERECTOMY;  Surgeon: [name], MD;  [facility];  Service: Urology;  Laterality: Right;   TRANSTHORACIC ECHOCARDIOGRAM  11-22-2016   dr hochrein   ef 55-60%/ mild MR and TR/ moderate LAE   TRANSURETHRAL RESECTION OF BLADDER TUMOR N/A 02/10/2021   Procedure: TRANSURETHRAL RESECTION OF BLADDER TUMOR (TURBT)/ CYSTOSCOPY/ LEFT RETROGRADE;  Surgeon: [name], MD;  [facility];  Service: Urology;  Laterality: N/A;  GENERAL ANESTHESIA WITH PARALYSIS    MEDICATIONS:  amLODipine (NORVASC) 5 MG tablet   apixaban (ELIQUIS) 5 MG TABS tablet   furosemide (LASIX) 20 MG tablet   isosorbide mononitrate (IMDUR) 30 MG 24 hr tablet   metoprolol tartrate (LOPRESSOR) 25 MG tablet   nitroGLYCERIN (NITROSTAT) 0.4 MG SL tablet   olmesartan (BENICAR) 20 MG tablet   rosuvastatin (CRESTOR) 40 MG tablet   sitaGLIPtin (JANUVIA) 100 MG tablet   No current facility-administered medications for this encounter.    sodium phosphate (FLEET) 7-19  GM/118ML enema 1 enema    [facility], PA-C [facility] Pre-Surgical Testing [phone]

## 2021-11-09 NOTE — H&P (Signed)
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 Prostate cancer   Adam Harris returns today for surveillance cystoscopy following his last 3-week maintenance session of BCG that he completed in August. He states that he did relatively well with this. He has continued to have intermittent hematuria that has occurred periodically. He remains anticoagulated with Eliquis. He has been urinating frequently although has been getting up less at night since beginning Benadryl.     ALLERGIES: No Allergies    MEDICATIONS: Atorvastatin Calcium 20 MG Oral Tablet Oral  Eliquis 5 mg tablet Oral  MetFORMIN HCl - 500 MG Oral Tablet Oral  Metoprolol Tartrate 25 MG Oral Tablet Oral  Nitrostat 0.4 MG Sublingual Tablet Sublingual Sublingual  Tribenzor 40 mg-10 mg-25 mg tablet Oral     GU PSH: Bladder Instill AntiCA Agent - 07/08/2021, 06/30/2021, 06/23/2021, 04/29/2021, 04/19/2021, 03/29/2021, 10/18/2020, 10/11/2020, 09/30/2020, 09/23/2020, 09/16/2020, 09/02/2020, 2021, 2021, 2021, 2021, 2021, 2021 Cysto Bladder Ureth Biopsy - 2021 Cysto Uretero Biopsy Fulgura, Right - 2021 Cystoscopy - 06/03/2021, 01/19/2021, 06/23/2020, 2021, 2019 Cystoscopy TURBT 2-5 cm - 02/10/2021, 08/12/2020 Cystoscopy Ureteroscopy, Bilateral - 2019 Inject For cystogram - 2021 Lap Nephro Ureterectomy, Right - 2021 Locm 300-399Mg /Ml Iodine,1Ml - 2021, 2021, 2019 Prostate Needle Biopsy - 08/12/2020, 2018       [place] Notes: Knee Arthroscopy   NON-GU PSH: Surgical Pathology, Gross And Microscopic Examination For Prostate Needle - 2018     GU PMH: Bladder Cancer overlapping sites - 07/08/2021, - 06/30/2021, - 06/23/2021, - 05/27/2021, - 04/29/2021, - 04/19/2021, - 04/08/2021, - 03/29/2021, - 02/23/2021, - 01/19/2021, - 10/18/2020, - 10/11/2020, - 09/30/2020, - 09/23/2020, - 09/16/2020, - 09/09/2020, - 09/02/2020, - 08/24/2020 CIS of the bladder - 06/03/2021, - 2021 Prostate Cancer - 06/03/2021, -  02/23/2021, - 01/19/2021, - 08/24/2020 (Stable), His prostate remains benign to exam. I will obtain a PSA and have recommended he return in 6 months for repeat DRE and PSA., - 2020 (Stable), His prostate was noted to be entirely benign today. I will obtain a PSA today since he has due for a recheck., - 2019 (Stable), I went over his pathology report with him today which has revealed no evidence of grade or stage progression of his low risk prostate cancer. My recommendation to him has been continued active surveillance and he has agreed with this plan., - 2018 (Stable), I have discussed with the patient the possibility of blood per rectum, per urethra and in the ejaculate. He was counseled to contact me if he has any difficulties following his prostate biopsy whatsoever., - 2018 (Stable), His prostate was noted to be smooth and benign to examination however his PSA has risen significantly. It was 8.3 when we did his biopsy initially and then came down quite low. I recommended we repeat the PSA today but also I told him this time we proceed with a repeat biopsy., - 2018 (Stable), His prostate remains benign on his examination and his PSA continues to remain low and stable. I will continue active surveillance with DRE and PSA again in 6 months., - 2017, Adenocarcinoma of prostate, - 2017 Renal pelvis cancer, right - 06/03/2021, - 02/23/2021, - 01/19/2021, - 08/24/2020 (Stable), - 04/20/2020, Right, - 2021 Abdominal Pain Unspec - 04/19/2021 Abnormal radiologic findings on diagnostic imaging of renal pelvis, ureter, or bladder, Bilateral - 08/13/2020, Bilateral, Due to the finding of abnormalities of both his right ureter and left renal pelvic region we are going  to proceed with further evaluation with cystoscopy, bilateral retrograde pyelography, bilateral ureteroscopy and possible bilateral double-J stent placement., - 2019 Gross hematuria, Continues to have intermittent gross hematuria. It seems to be associated with some  pain in his right flank region but no history of stones. With his previous abnormality of his ureter I have recommended we repeat a CT scan with and without contrast and then will determine further evaluation once that is been completed. - 10/21/2019, He experienced gross hematuria. He is on Eliquis. My suspicion is that this is going to be from the prostate since no other abnormality was noted previously., - 2020 Hydronephrosis (Stable), Right, He has mild right hydronephrosis noted on his CT scan that is unchanged and has been evaluated and noted to be nonobstructive in nature. - 2020 Microscopic hematuria, He was noted to have microscopic hematuria today. We therefore have discussed the need for further evaluation. He did not appear to have any infection but did have a few white cells so I am going to culture his urine. We will then obtain a creatinine and schedule him for a CT scan to evaluate the upper tract and will then have him return for lower tract evaluation with cystoscopy. - 2019 BPH w/o LUTS (Stable), He does have some slight prostatic enlargement on exam but does not have significant voiding symptoms. - 2018 BPH w/LUTS (Stable), He has some BPH by exam with nocturia 2 but he said it is not significant enough that he would want to consider any form of pharmacologic therapy at this time. - 2017, Benign prostatic hyperplasia with urinary obstruction, - 2016 Nocturia (Stable), His nocturia is not a significant bother to him. 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 He is s/p right RAL nephroureterectomy on 03/22/20.   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 He had no worrisome nodularity or induration noted on DRE but did have a mild rectal stricture. He underwent TRUS/BX on 01/18/12 at which time his prostate was noted to be 56 cc.  Pathology: He was found to have a single core positive for Gleason 3+3 = 6 adenocarcinoma involving 10% of that core.  Treatment: Active surveillance  Repeat TRUS/BX 07/24/17: Prostate volume - 57 cc  Pathology: 1 positive for Gleason 6 in 5% from the left apex laterally.  Treatment: Continued active surveillance.   Surveillance:  Sep 2021: 12 core biopsy - Benign with chronic inflammation, Vol 53 cc   3) BPH with LUTS: He reported having some slowing of his urinary stream as well as nocturia.  Treatment: None currently   4) Left hydrocele: At one point he found it to be a bother but  he says it really not giving him any trouble other than occasionally when he crosses his legs. It is not increasing in size.       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  Drinks 3 caffeinated drinks per day.     Notes: Former smoker, Occupation:, Marital History - Currently Married, Caffeine Use, Alcohol Use   REVIEW OF SYSTEMS:    GU Review Male:   Patient denies frequent urination, hard to postpone urination, burning/ pain with urination, get up at night to urinate, leakage of urine, stream starts and stops, trouble starting your streams, and have to strain to urinate .  Gastrointestinal (Upper):   Patient denies nausea and vomiting.  Gastrointestinal (Lower):   Patient denies diarrhea and constipation.  Constitutional:   Patient denies fever, night sweats, weight loss, and fatigue.  Skin:   Patient denies skin rash/ lesion and itching.  Eyes:   Patient denies blurred vision and double vision.  Ears/ Nose/ Throat:   Patient denies sore throat and sinus problems.  Hematologic/Lymphatic:   Patient denies swollen glands and easy bruising.  Cardiovascular:   Patient denies leg swelling and chest pains.  Respiratory:   Patient denies cough and shortness of breath.  Endocrine:   Patient denies excessive thirst.  Musculoskeletal:   Patient denies joint pain and back pain.  Neurological:   Patient denies headaches and dizziness.  Psychologic:   Patient denies depression and anxiety.   VITAL SIGNS:     Weight 220 lb / 99.79 kg  Height 71 in / 180.34 cm  BP 144/87 mmHg  Pulse 76 /min  Temperature 97.7 F / 36.5 C  BMI 30.7 kg/m   GU PHYSICAL EXAMINATION:    Urethral Meatus: Normal size. No lesion, no wart, no discharge, no polyp. Normal location.   MULTI-SYSTEM PHYSICAL EXAMINATION:    Constitutional: Well-nourished. No physical deformities. Normally developed. Good grooming.     Complexity of Data:  Records Review:   Previous Patient Records  Urodynamics Review:   Review Bladder Scan   05/27/21 06/15/20 10/21/19 02/27/19 01/08/18 07/11/17 06/07/17 07/19/16   PSA  Total PSA 5.54 ng/mL 8.69 ng/mL 2.39 ng/mL 3.27 ng/mL 1.67 ng/mL 4.28 ng/mL 10.80 ng/mL 1.92 ng/dl    PROCEDURES:         Flexible Cystoscopy - 52000  Indication: Bladder cancer Risks, benefits, and potential complications of the procedure were discussed with the patient including infection, bleeding, voiding discomfort, urinary retention, fever, chills, sepsis, and others. All questions were answered. Informed consent was obtained. Sterile technique and intraurethral analgesia were used.  Meatus:  Normal size. Normal location. Normal condition.  Urethra:  No strictures.  External Sphincter:  Normal.  Verumontanum:  Normal.  Prostate:  Moderate hyperplasia. Non-obstructing.  Bladder Neck:  Non-obstructing.  Ureteral Orifices:  Normal location. Normal size. Normal shape. Effluxed clear urine.  Bladder:  Visualization was somewhat obscured due to hematuria. He did have an area of mild  bleeding near the neck of the bladder. No tumor was identified. He did have multiple areas of erythema throughout the bladder possibly consistent with BCG change. A bladder washing was obtained for cytology.      Chaperone:BS The procedure was well-tolerated and without complications. Instructions were given to call the office immediately if questions or problems.        PVR Ultrasound - 19509  Scanned Volume: 0 cc         Urinalysis w/Scope - 81001 Dipstick Dipstick Cont'd Micro  Color: Amber Bilirubin: Neg WBC/hpf: 6 - 10/hpf  Appearance: Turbid Ketones: Neg RBC/hpf: 40 - 60/hpf  Specific Gravity: 1.020 Blood: 3+ Bacteria: NS (Not Seen)  pH: 6.0 Protein: 2+ Cystals: NS (Not Seen)  Glucose: Neg Urobilinogen: 0.2 Casts: NS (Not Seen)    Nitrites: Neg Trichomonas: Not Present    Leukocyte Esterase: Trace Mucous: Not Present      Epithelial Cells: NS (Not Seen)      Yeast: NS (Not Seen)      Sperm: Not Present    Notes:  unspun specimen     ASSESSMENT:      ICD-10 Details  1 GU:   CIS of  the bladder - D09.0   2   Microscopic hematuria - R31.1    PLAN:       1. Bladder cancer/upper tract urothelial cancer: He has no clear cystoscopic evidence of recurrence although a bladder washing has been obtained today and visualization was somewhat obscured. If his cytology is negative, he will follow-up in 3 months for surveillance cystoscopy as well as to undergo imaging studies and laboratory studies as ordered at his last visit in July. If he has no evidence of disease recurrence at that time, we will then proceed with another 72-month maintenance cycle of BCG although may consider reducing his dose to half strength.    APPENDED NOTES:    His cytology was noted to be suspicious for recurrence of malignancy. We discussed proceeding with cystoscopy and bladder biopsies for further evaluation. This will be scheduled for the near future. I likely will perform a left retrograde pyelogram at the same time to ensure no upper tract recurrence.    * Signed by [name], M.D. on 10/14/21 at 5:28 PM (EST)*      Cr 2.38    * Signed by [name], M.D. on 11/02/21 at 10:33 AM (EST)*

## 2021-11-10 ENCOUNTER — Encounter (HOSPITAL_COMMUNITY): Payer: Self-pay | Admitting: Urology

## 2021-11-10 ENCOUNTER — Encounter (HOSPITAL_COMMUNITY)
Admission: RE | Disposition: A | Discharge status: Home / Self Care | Payer: Self-pay | Source: Ambulatory Visit | Attending: Urology

## 2021-11-10 ENCOUNTER — Ambulatory Visit (HOSPITAL_COMMUNITY): Payer: Medicare Other | Admitting: Certified Registered Nurse Anesthetist

## 2021-11-10 ENCOUNTER — Ambulatory Visit (HOSPITAL_COMMUNITY)
Admission: RE | Admit: 2021-11-10 | Discharge: 2021-11-10 | Disposition: A | Discharge status: Home / Self Care | Payer: Medicare Other | Source: Ambulatory Visit | Attending: Urology | Admitting: Urology

## 2021-11-10 ENCOUNTER — Ambulatory Visit (HOSPITAL_COMMUNITY): Payer: Medicare Other | Admitting: Physician Assistant

## 2021-11-10 ENCOUNTER — Ambulatory Visit (HOSPITAL_COMMUNITY): Payer: Medicare Other

## 2021-11-10 ENCOUNTER — Other Ambulatory Visit: Payer: Self-pay

## 2021-11-10 DIAGNOSIS — C651 Malignant neoplasm of right renal pelvis: Secondary | ICD-10-CM | POA: Diagnosis not present

## 2021-11-10 DIAGNOSIS — I10 Essential (primary) hypertension: Secondary | ICD-10-CM | POA: Insufficient documentation

## 2021-11-10 DIAGNOSIS — C61 Malignant neoplasm of prostate: Secondary | ICD-10-CM | POA: Insufficient documentation

## 2021-11-10 DIAGNOSIS — R3912 Poor urinary stream: Secondary | ICD-10-CM | POA: Insufficient documentation

## 2021-11-10 DIAGNOSIS — E1122 Type 2 diabetes mellitus with diabetic chronic kidney disease: Secondary | ICD-10-CM

## 2021-11-10 DIAGNOSIS — N433 Hydrocele, unspecified: Secondary | ICD-10-CM | POA: Diagnosis not present

## 2021-11-10 DIAGNOSIS — Z7901 Long term (current) use of anticoagulants: Secondary | ICD-10-CM | POA: Diagnosis not present

## 2021-11-10 DIAGNOSIS — Z87891 Personal history of nicotine dependence: Secondary | ICD-10-CM | POA: Insufficient documentation

## 2021-11-10 DIAGNOSIS — R351 Nocturia: Secondary | ICD-10-CM | POA: Diagnosis not present

## 2021-11-10 DIAGNOSIS — C679 Malignant neoplasm of bladder, unspecified: Secondary | ICD-10-CM | POA: Insufficient documentation

## 2021-11-10 DIAGNOSIS — Z79899 Other long term (current) drug therapy: Secondary | ICD-10-CM | POA: Insufficient documentation

## 2021-11-10 DIAGNOSIS — N3031 Trigonitis with hematuria: Secondary | ICD-10-CM | POA: Diagnosis not present

## 2021-11-10 DIAGNOSIS — N3289 Other specified disorders of bladder: Secondary | ICD-10-CM | POA: Insufficient documentation

## 2021-11-10 DIAGNOSIS — N401 Enlarged prostate with lower urinary tract symptoms: Secondary | ICD-10-CM | POA: Diagnosis not present

## 2021-11-10 DIAGNOSIS — N3021 Other chronic cystitis with hematuria: Secondary | ICD-10-CM | POA: Insufficient documentation

## 2021-11-10 HISTORY — PX: CYSTOSCOPY WITH BIOPSY: SHX5122

## 2021-11-10 LAB — GLUCOSE, CAPILLARY
Glucose-Capillary: 117 mg/dL — ABNORMAL HIGH (ref 70–99)
Glucose-Capillary: 121 mg/dL — ABNORMAL HIGH (ref 70–99)

## 2021-11-10 SURGERY — CYSTOSCOPY, WITH BIOPSY
Anesthesia: General

## 2021-11-10 MED ORDER — STERILE WATER FOR IRRIGATION IR SOLN
Status: DC | PRN
  Administered 2021-11-10: 3000 mL via INTRAVESICAL

## 2021-11-10 MED ORDER — SUCCINYLCHOLINE CHLORIDE 200 MG/10ML IV SOSY
PREFILLED_SYRINGE | INTRAVENOUS | Status: DC | PRN
  Administered 2021-11-10: 120 mg via INTRAVENOUS

## 2021-11-10 MED ORDER — OXYCODONE HCL 5 MG/5ML PO SOLN: 5.0000 mg | Freq: Once | ORAL | Status: DC | PRN

## 2021-11-10 MED ORDER — PHENYLEPHRINE 40 MCG/ML (10ML) SYRINGE FOR IV PUSH (FOR BLOOD PRESSURE SUPPORT)
PREFILLED_SYRINGE | INTRAVENOUS | Status: DC | PRN
  Administered 2021-11-10 (×3): 80 ug via INTRAVENOUS

## 2021-11-10 MED ORDER — IOHEXOL 300 MG/ML  SOLN
INTRAMUSCULAR | Status: DC | PRN
  Administered 2021-11-10: 14:00:00 9 mL via URETHRAL

## 2021-11-10 MED ORDER — PHENYLEPHRINE HCL-NACL 20-0.9 MG/250ML-% IV SOLN
INTRAVENOUS | Status: DC | PRN
  Administered 2021-11-10: 35 ug/min via INTRAVENOUS

## 2021-11-10 MED ORDER — EPHEDRINE 5 MG/ML INJ
INTRAVENOUS | Status: AC
  Filled 2021-11-10: qty 15

## 2021-11-10 MED ORDER — PHENAZOPYRIDINE HCL 200 MG PO TABS: 200.0000 mg | ORAL_TABLET | Freq: Three times a day (TID) | ORAL | 0 refills | Status: DC | PRN

## 2021-11-10 MED ORDER — LIDOCAINE 2% (20 MG/ML) 5 ML SYRINGE
INTRAMUSCULAR | Status: DC | PRN
Start: 2021-11-10 — End: 2021-11-10
  Administered 2021-11-10: 60 mg via INTRAVENOUS

## 2021-11-10 MED ORDER — FENTANYL CITRATE (PF) 100 MCG/2ML IJ SOLN
INTRAMUSCULAR | Status: AC
  Filled 2021-11-10: qty 2

## 2021-11-10 MED ORDER — LACTATED RINGERS IV SOLN: INTRAVENOUS | Status: DC

## 2021-11-10 MED ORDER — ONDANSETRON HCL 4 MG/2ML IJ SOLN
INTRAMUSCULAR | Status: DC | PRN
  Administered 2021-11-10: 4 mg via INTRAVENOUS

## 2021-11-10 MED ORDER — ACETAMINOPHEN 160 MG/5ML PO SOLN: 1000.0000 mg | Freq: Once | ORAL | Status: DC | PRN

## 2021-11-10 MED ORDER — ACETAMINOPHEN 500 MG PO TABS: 1000.0000 mg | ORAL_TABLET | Freq: Once | ORAL | Status: DC | PRN

## 2021-11-10 MED ORDER — DEXAMETHASONE SODIUM PHOSPHATE 10 MG/ML IJ SOLN
INTRAMUSCULAR | Status: AC
  Filled 2021-11-10: qty 1

## 2021-11-10 MED ORDER — PROPOFOL 10 MG/ML IV BOLUS
INTRAVENOUS | Status: DC | PRN
  Administered 2021-11-10: 120 mg via INTRAVENOUS
  Administered 2021-11-10: 20 mg via INTRAVENOUS

## 2021-11-10 MED ORDER — ONDANSETRON HCL 4 MG/2ML IJ SOLN
INTRAMUSCULAR | Status: AC
  Filled 2021-11-10: qty 2

## 2021-11-10 MED ORDER — OXYCODONE HCL 5 MG PO TABS: 5.0000 mg | ORAL_TABLET | Freq: Once | ORAL | Status: DC | PRN

## 2021-11-10 MED ORDER — EPHEDRINE SULFATE-NACL 50-0.9 MG/10ML-% IV SOSY
PREFILLED_SYRINGE | INTRAVENOUS | Status: DC | PRN
  Administered 2021-11-10: 5 mg via INTRAVENOUS
  Administered 2021-11-10: 10 mg via INTRAVENOUS

## 2021-11-10 MED ORDER — FENTANYL CITRATE PF 50 MCG/ML IJ SOSY: 25.0000 ug | PREFILLED_SYRINGE | INTRAMUSCULAR | Status: DC | PRN

## 2021-11-10 MED ORDER — CHLORHEXIDINE GLUCONATE 0.12 % MT SOLN
15.0000 mL | Freq: Once | OROMUCOSAL | Status: AC
  Administered 2021-11-10: 12:00:00 15 mL via OROMUCOSAL

## 2021-11-10 MED ORDER — CEFAZOLIN SODIUM-DEXTROSE 2-4 GM/100ML-% IV SOLN
2.0000 g | Freq: Once | INTRAVENOUS | Status: AC
  Administered 2021-11-10: 14:00:00 2 g via INTRAVENOUS
  Filled 2021-11-10: qty 100

## 2021-11-10 MED ORDER — PHENYLEPHRINE 40 MCG/ML (10ML) SYRINGE FOR IV PUSH (FOR BLOOD PRESSURE SUPPORT)
PREFILLED_SYRINGE | INTRAVENOUS | Status: AC
  Filled 2021-11-10: qty 20

## 2021-11-10 MED ORDER — SUCCINYLCHOLINE CHLORIDE 200 MG/10ML IV SOSY
PREFILLED_SYRINGE | INTRAVENOUS | Status: AC
  Filled 2021-11-10: qty 10

## 2021-11-10 MED ORDER — DEXAMETHASONE SODIUM PHOSPHATE 10 MG/ML IJ SOLN
INTRAMUSCULAR | Status: DC | PRN
  Administered 2021-11-10: 5 mg via INTRAVENOUS

## 2021-11-10 MED ORDER — ORAL CARE MOUTH RINSE: 15.0000 mL | Freq: Once | OROMUCOSAL | Status: AC

## 2021-11-10 MED ORDER — FENTANYL CITRATE (PF) 100 MCG/2ML IJ SOLN
INTRAMUSCULAR | Status: DC | PRN
  Administered 2021-11-10: 100 ug via INTRAVENOUS
  Administered 2021-11-10 (×2): 50 ug via INTRAVENOUS

## 2021-11-10 MED ORDER — ACETAMINOPHEN 10 MG/ML IV SOLN: 1000.0000 mg | Freq: Once | INTRAVENOUS | Status: DC | PRN

## 2021-11-10 SURGICAL SUPPLY — 18 items
BAG URINE DRAIN 2000ML AR STRL (UROLOGICAL SUPPLIES) IMPLANT
BAG URO CATCHER STRL LF (MISCELLANEOUS) ×2 IMPLANT
CATH INTERMIT  6FR 70CM (CATHETERS) ×1 IMPLANT
CLEANER TIP ELECTROSURG 2X2 (MISCELLANEOUS) ×1 IMPLANT
DRAPE FOOT SWITCH (DRAPES) ×2 IMPLANT
ELECT REM PT RETURN 15FT ADLT (MISCELLANEOUS) ×2 IMPLANT
GLOVE SURG ENC TEXT LTX SZ7.5 (GLOVE) ×2 IMPLANT
GLOVE SURG UNDER POLY LF SZ6.5 (GLOVE) ×1 IMPLANT
GOWN STRL REUS W/TWL LRG LVL3 (GOWN DISPOSABLE) ×3 IMPLANT
KIT TURNOVER KIT A (KITS) IMPLANT
LOOP CUT BIPOLAR 24F LRG (ELECTROSURGICAL) IMPLANT
MANIFOLD NEPTUNE II (INSTRUMENTS) ×2 IMPLANT
PACK CYSTO (CUSTOM PROCEDURE TRAY) ×2 IMPLANT
PENCIL SMOKE EVACUATOR (MISCELLANEOUS) IMPLANT
SYR TOOMEY IRRIG 70ML (MISCELLANEOUS) ×2
SYRINGE TOOMEY IRRIG 70ML (MISCELLANEOUS) IMPLANT
TUBING CONNECTING 10 (TUBING) ×2 IMPLANT
TUBING UROLOGY SET (TUBING) ×2 IMPLANT

## 2021-11-10 NOTE — Interval H&P Note (Signed)
History and Physical Interval Note:  11/10/2021 11:52 AM  Adam Harris  has presented today for surgery, with the diagnosis of BLADDER CANCER.  The various methods of treatment have been discussed with the patient and family. After consideration of risks, benefits and other options for treatment, the patient has consented to  Procedure(s): CYSTOSCOPY WITH BLADDER BIOPSIES/ LEFT RETROGRADE (N/A) as a surgical intervention.  The patient's history has been reviewed, patient examined, no change in status, stable for surgery.  I have reviewed the patient's chart and labs.  Questions were answered to the patient's satisfaction.     [employer]

## 2021-11-10 NOTE — Op Note (Signed)
Preoperative diagnosis: History of urothelial carcinoma of the right ureter and bladder  Postoperative diagnosis: History of urothelial carcinoma of the right ureter and bladder  Procedure: 1.  Cystoscopy 2.  Left retrograde pyelography with interpretation 3.  Site selected bladder biopsies  Surgeon: [name] MD  Anesthesia: General  Complications: None  EBL: Minimal  Specimens: 1.  Posterior bladder biopsy 2.  Left trigone biopsy 3.  Right lateral bladder biopsy 4.  Right trigone biopsy  Disposition of specimens: Pathology  Intraoperative findings: A left retrograde pyelogram was performed with a 6 French ureteral catheter and Omnipaque contrast.  This indicated a normal caliber ureter without filling defects.  No left renal collecting system filling defects.  Indication: Adam Harris is a 75 year old gentleman with a history of urothelial carcinoma of the right ureter status post nephroureterectomy.  He is also noted to have a history of carcinoma in situ and high-grade, TA urothelial carcinoma the bladder status post multiple treatments with BCG intravesical therapy.  He recently presented was noted to have some erythematous change within the bladder consistent with inflammatory change related to BCG versus possible carcinoma in situ.  His cytology was suspicious for malignancy and he presents today to undergo further evaluation.  The potential risks, complications, and expected recovery process associated with the above procedures was discussed in detail.  Informed consent was obtained.  Description of procedure: The patient was taken to the operating room and a general anesthetic was administered.  He was given preoperative antibiotics, placed in the dorsolithotomy position, and prepped and draped in usual sterile fashion.  Next, a preoperative timeout was performed.  Cystourethroscopy was then performed with a 30 and 70 degree lens.  This revealed a normal anterior urethra.   He did have evidence of BPH with a high bladder neck and small intravesical median lobe.  Inspection of bladder revealed the left ureteral orifice to be in its normal expected anatomic location.  No right ureteral orifice was present consistent with his prior surgical history.  He had multiple erythematous areas throughout the bladder including the posterior bladder, left trigone, right lateral bladder, and right trigone.  No definite bladder tumors were identified.  The left ureteral orifice was cannulated with a 6 French ureteral catheter.  Omnipaque contrast was then injected and revealed a normal left upper urinary tract without concern for tumor.  Attention then returned to the bladder.  The aforementioned erythematous areas were biopsied with a cold cup biopsy forceps.  Each specimen was sent separately for permanent pathologic analysis.  A Bugbee electrode was then used to achieve hemostasis.  The bladder was then emptied and reinspected and hemostasis appeared excellent.  He was able to be awakened and transferred to recovery unit in satisfactory condition.

## 2021-11-10 NOTE — Discharge Instructions (Addendum)
You may see some blood in the urine and may have some burning with urination for 48-72 hours. You also may notice that you have to urinate more frequently or urgently after your procedure which is normal.  You should call should you develop an inability urinate, fever > 101, persistent nausea and vomiting that prevents you from eating or drinking to stay hydrated.    YOU MAY RESUME ELIQUIS IN 3 DAYS IF YOUR URINE IS CLEAR.  IF STILL BLOOD IN THE URINE, WAIT UNTIL NEXT Tuesday AND CALL THE OFFICE IF STILL WITH BLOODY URINE AT THAT TIME BEFORE RESTARTING ELIQUIS.

## 2021-11-10 NOTE — Anesthesia Procedure Notes (Addendum)
Procedure Name: Intubation Date/Time: 11/10/2021 1:30 PM Performed by: West Pugh, CRNA Pre-anesthesia Checklist: Patient identified, Emergency Drugs available, Suction available, Patient being monitored and Timeout performed Patient Re-evaluated:Patient Re-evaluated prior to induction Oxygen Delivery Method: Circle system utilized Preoxygenation: Pre-oxygenation with 100% oxygen Induction Type: IV induction Ventilation: Mask ventilation without difficulty Laryngoscope Size: Mac and 4 Grade View: Grade I Tube type: Oral Tube size: 7.5 mm Number of attempts: 1 Airway Equipment and Method: Stylet Placement Confirmation: ETT inserted through vocal cords under direct vision, positive ETCO2, CO2 detector and breath sounds checked- equal and bilateral Secured at: 22 cm Tube secured with: Tape Dental Injury: Teeth and Oropharynx as per pre-operative assessment

## 2021-11-10 NOTE — Anesthesia Preprocedure Evaluation (Addendum)
Anesthesia Evaluation  Patient identified by MRN, date of birth, ID band Patient awake    Reviewed: Allergy & Precautions, NPO status , Patient's Chart, lab work & pertinent test results  History of Anesthesia Complications (+) history of anesthetic complications  Airway Mallampati: II  TM Distance: >3 FB Neck ROM: Full    Dental  (+) Dental Advisory Given, Edentulous Upper, Edentulous Lower   Pulmonary shortness of breath, former smoker,    breath sounds clear to auscultation       Cardiovascular hypertension, Pt. on medications and Pt. on home beta blockers (-) angina+ CAD, + Past MI and +CHF  + dysrhythmias Atrial Fibrillation  Rhythm:Irregular  - Left ventricle: The cavity size was normal. Systolic function was  normal. The estimated ejection fraction was in the range of 55%  to 60%. Wall motion was normal; there were no regional wall  motion abnormalities.  - Aortic valve: Trileaflet; moderately thickened, moderately  calcified leaflets.  - Mitral valve: There was mild regurgitation.  - Left atrium: The atrium was moderately dilated.  - Tricuspid valve: There was mild regurgitation.  - Pulmonary arteries: Systolic pressure was mildly increased. PA  peak pressure: 37 mm Hg (S).    Conclusion   ? Prox RCA lesion, 100 %stenosed. ? Ost LAD lesion, 45 %stenosed. ? 3rd Mrg lesion, 80 %stenosed. ? Mid Cx to Dist Cx lesion, 100 %stenosed. ? Lat 3rd Mrg lesion, 50 %stenosed.   Multivessel CAD: The LAD had ostial 40 to less than 50% focal stenosis.  There were mild luminal irregularities in the LAD, which extended to the apex but otherwise was without additional significant obstructive disease.  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   The OM1 vessel had 80% stenosis before giving rise to a branch and then had 50% stenosis in the inferior limb.  The AV groove circumflex was 99/100% occluded immediately after the large aneurysmal segment and  there was faint filling of a very large an additional aneurysmal segment with thrombus and very faint filling of the distal posterolateral branches.  There was faint collateralization of the distal posterolateral branches via the inferior branch of the obtuse marginal vessel.  The RCA was totally occluded proximally as had been previously demonstrated and there were faint bridging collaterals supplying the mid RCA which was a nondominant vessel distally.  LVEDP 18 mm Hg.  RECOMMENDATION: Angiographic findings were reviewed with Dr. Tamala Julian.  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  His RCA was totally occluded at that time.  His RCA is essentially unchanged.  His AV groove circumflex is now occluded, which most likely is the culprit lesion.  The patient had been inadequately been taking his anticoagulation.  Heparin will be resumed tonight with plans to resume oral anticoagulation.  Initially, the patient will be treated with increasing medical therapy.  He is completely pain-free.  He will be started on nitrates, in addition to increased beta blocker therapy.  He is not a candidate for stenting due to the significant aneurysmal dilatations throughout his vessels.  If he develops increasing symptomatology, consider possible PTCA and/or evaluation for surgical revascularization.    Neuro/Psych  Headaches,  Neuromuscular disease    GI/Hepatic negative GI ROS, Neg liver  ROS,   Endo/Other  diabetesLab Results      Component                Value               Date                      HGBA1C                   6.8 (H)             11/02/2021             Renal/GU CRFRenal diseaseLab Results       Component                Value               Date                      CREATININE               2.38 (H)            11/02/2021              Bladder CA    Musculoskeletal  (+) Arthritis ,   Abdominal   Peds  Hematology eliquis  Lab Results      Component                Value               Date                      WBC                      8.8                 11/02/2021                HGB                      15.5                11/02/2021                HCT                      48.4                11/02/2021                MCV                      90.5                11/02/2021                PLT                      149 (L)             11/02/2021              Anesthesia Other Findings   Reproductive/Obstetrics                            Anesthesia Physical Anesthesia Plan  ASA: 3  Anesthesia Plan: General   Post-op Pain Management: Minimal or no pain anticipated   Induction: Intravenous  PONV Risk Score and Plan: 2 and Dexamethasone and Ondansetron  Airway Management Planned: Oral ETT  Additional Equipment: None  Intra-op Plan:   Post-operative Plan: Extubation in OR  Informed Consent: I have reviewed the patients History and Physical, chart, labs and discussed the procedure including the risks, benefits and alternatives for the proposed anesthesia with the patient or authorized representative who has indicated his/her understanding and acceptance.     Dental advisory given  Plan Discussed with: CRNA and Anesthesiologist  Anesthesia Plan Comments:         Anesthesia Quick Evaluation

## 2021-11-10 NOTE — Anesthesia Postprocedure Evaluation (Signed)
Anesthesia Post Note  Patient: Adam Harris  Procedure(s) Performed: CYSTOSCOPY WITH BLADDER BIOPSIES/ LEFT RETROGRADE     Patient location during evaluation: PACU Anesthesia Type: General Level of consciousness: awake and alert Pain management: pain level controlled Vital Signs Assessment: post-procedure vital signs reviewed and stable Respiratory status: spontaneous breathing, nonlabored ventilation, respiratory function stable and patient connected to nasal cannula oxygen Cardiovascular status: blood pressure returned to baseline and stable Postop Assessment: no apparent nausea or vomiting Anesthetic complications: no   No notable events documented.  Last Vitals:  Vitals:   11/10/21 1500 11/10/21 1515  BP: (!) 133/94 (!) 146/91  Pulse: 69 (!) 47  Resp: 13 17  Temp:  (!) 36.3 C  SpO2: 97% 94%    Last Pain:  Vitals:   11/10/21 1515  TempSrc:   PainSc: 0-No pain                 Orabelle Rylee

## 2021-11-10 NOTE — Transfer of Care (Signed)
Immediate Anesthesia Transfer of Care Note  Patient: Adam Harris  Procedure(s) Performed: CYSTOSCOPY WITH BLADDER BIOPSIES/ LEFT RETROGRADE  Patient Location: PACU  Anesthesia Type:General  Level of Consciousness: drowsy  Airway & Oxygen Therapy: Patient Spontanous Breathing and Patient connected to face mask oxygen  Post-op Assessment: Report given to RN and Post -op Vital signs reviewed and stable  Post vital signs: Reviewed and stable  Last Vitals:  Vitals Value Taken Time  BP 130/77 11/10/21 1431  Temp    Pulse 64 11/10/21 1435  Resp 19 11/10/21 1435  SpO2 96 % 11/10/21 1435  Vitals shown include unvalidated device data.  Last Pain:  Vitals:   11/10/21 1133  TempSrc: Oral         Complications: No notable events documented.

## 2021-11-11 ENCOUNTER — Encounter (HOSPITAL_COMMUNITY): Payer: Self-pay | Admitting: Urology

## 2021-11-11 LAB — SURGICAL PATHOLOGY

## 2021-12-02 ENCOUNTER — Encounter: Payer: Self-pay | Admitting: Family Medicine

## 2021-12-02 DIAGNOSIS — N433 Hydrocele, unspecified: Secondary | ICD-10-CM | POA: Insufficient documentation

## 2021-12-05 ENCOUNTER — Encounter (INDEPENDENT_AMBULATORY_CARE_PROVIDER_SITE_OTHER): Payer: Medicare Other | Admitting: Ophthalmology

## 2021-12-12 ENCOUNTER — Other Ambulatory Visit: Payer: Self-pay

## 2021-12-13 ENCOUNTER — Encounter: Payer: Self-pay | Admitting: Family Medicine

## 2021-12-13 ENCOUNTER — Ambulatory Visit (INDEPENDENT_AMBULATORY_CARE_PROVIDER_SITE_OTHER): Payer: Medicare PPO | Admitting: Family Medicine

## 2021-12-13 VITALS — BP 134/80 | HR 67 | Temp 97.1°F | Ht 71.0 in | Wt 220.8 lb

## 2021-12-13 DIAGNOSIS — E1142 Type 2 diabetes mellitus with diabetic polyneuropathy: Secondary | ICD-10-CM

## 2021-12-13 DIAGNOSIS — E1159 Type 2 diabetes mellitus with other circulatory complications: Secondary | ICD-10-CM

## 2021-12-13 DIAGNOSIS — I25119 Atherosclerotic heart disease of native coronary artery with unspecified angina pectoris: Secondary | ICD-10-CM | POA: Diagnosis not present

## 2021-12-13 DIAGNOSIS — H25012 Cortical age-related cataract, left eye: Secondary | ICD-10-CM | POA: Insufficient documentation

## 2021-12-13 DIAGNOSIS — E782 Mixed hyperlipidemia: Secondary | ICD-10-CM | POA: Diagnosis not present

## 2021-12-13 DIAGNOSIS — N184 Chronic kidney disease, stage 4 (severe): Secondary | ICD-10-CM

## 2021-12-13 DIAGNOSIS — I5032 Chronic diastolic (congestive) heart failure: Secondary | ICD-10-CM | POA: Diagnosis not present

## 2021-12-13 DIAGNOSIS — I4891 Unspecified atrial fibrillation: Secondary | ICD-10-CM

## 2021-12-13 DIAGNOSIS — I10 Essential (primary) hypertension: Secondary | ICD-10-CM

## 2021-12-13 NOTE — Progress Notes (Signed)
[facility] [address] [place] [address] Dept: [phone] Dept Fax: [phone]  Transfer of Care Office Visit  Subjective:    Patient ID: Adam Harris, male    DOB: 1946-02-16, 76 y.o..   MRN: [id number]  Chief Complaint  Patient presents with   Establish Care    [facility]- establish care.  C/o having bilateral knee pain.  Declines covid vaccines.      History of Present Illness:  Patient is in today to establish care. Mr. [name] was born in [place], [place]. His family moved to [place] when he was 76 years old. He attended [employer]. He received a BS in Health & Physical Education from [employer] and a Masters in Scientist, physiological from [employer]. 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 He has been married to Adam Harris for 67 years. They have 3 children (45, 42, 38). Their youngest child, Adam Harris, lives at home with them and is struggling with some mental issues. They find this stressful. They also have 2 grandchildren. Mr. [name] quit smoking in his 63s. He denies any alcohol or drug use.  Mr. [name] has a history of atrial fibrillation, hypertension, coronary artery disease, and chronic diastolic heart failure. He is managed on amlodipine and olmesartan for his blood pressure and CHF. He takes furosemide as needed. He is on isosorbide for management of angina. He takes Eliquis for anticoagulation related to his a. fib. He also has hyperlipidemia and is managed on rosuvastatin.  [name] has a history of Type 2 diabetes. He is managed on sitagliptin (Januvia).  [name] has a history of bilateral knee arthritis. He finds this gives him trouble only periodically.  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 He did not require treatment and this is beign followed by Dr. [name] (urology).  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 He has had a right nephroureterectomy. He continues to undergo close follow-upw ith both urology and nephrology.  Past Medical History: Patient Active Problem List   Diagnosis Date Noted   Cataract cortical, senile, left 12/13/2021   Diabetic peripheral neuropathy ([place]) 12/13/2021   Hydrocele 12/02/2021   Solitary kidney, acquired 10/26/2021   Hypertensive renal disease 10/26/2021   Carcinoma in situ of bladder 09/30/2021   Chronic anticoagulation 04/06/2021   Urothelial carcinoma ([place]) 03/22/2020   Chronic diastolic CHF (congestive heart failure), NYHA class 2 ([place]) 04/02/2017   Atrial fibrillation with RVR ([place]) 11/20/2016   Degenerative arthritis of knee, bilateral 05/31/2016   Low back pain 05/31/2016   Exertional angina ([place]) 10/29/2014   Chronic kidney disease, stage IV (severe) ([place]) 10/29/2014   History of coronary artery stent placement 07/24/2014   Coronary artery disease involving native coronary artery of native heart with angina pectoris ([place]) 03/09/2014   Type 2 diabetes mellitus with cardiac complication ([place]) 69/62/9528   Hyperlipidemia 02/05/2014   History of non-ST elevation myocardial infarction (NSTEMI) 02/03/2014   Essential hypertension 02/03/2014   Anxiety state 04/19/2013   History of prostate cancer 04/19/2013   Insomnia 04/19/2013   Stiffness of joint, lower leg 04/19/2013   Sciatica 04/19/2013   Past Surgical History:  Procedure Laterality Date   CARDIAC CATHETERIZATION N/A 11/21/2016   Procedure: Left Heart Cath and Coronary Angiography;  Surgeon: Troy Sine, MD;  Location: [facility];  Service: Cardiovascular;  Laterality: N/A;  pRCA 100% , ostial LAD 45%, OM3 80%, mCFX to dCFX 100% (AV groove), lateral OM3 50%   COLONOSCOPY     CYSTOSCOPY WITH BIOPSY N/A 08/12/2020   Procedure: CYSTOSCOPY WITH BLADDER BIOPSY AND TRANSURETHRAL RESECTION OF BLADDER TUMOR;  Surgeon: [name], MD;  Location: [facility];  Service:  Urology;  Laterality: N/A;   CYSTOSCOPY WITH BIOPSY N/A 11/10/2021   Procedure: CYSTOSCOPY WITH BLADDER BIOPSIES/ LEFT RETROGRADE;  Surgeon: [name], MD;  Location: [facility];  Service: Urology;  Laterality: N/A;   CYSTOSCOPY WITH URETEROSCOPY AND STENT PLACEMENT Right 12/15/2019   Procedure: CYSTOSCOPY WITH RIGHT RETROGRADE/ RIGHT URETEROSCOPY/ BIOPSY;  Surgeon: [name], MD;  Location: [facility];  Service: Urology;  Laterality: Right;   CYSTOSCOPY/RETROGRADE/URETEROSCOPY Bilateral 03/18/2018   Procedure: CYSTOSCOPY/RETROGRADE/URETEROSCOPY.;  Surgeon: [name], MD;  Location: [facility];  Service: Urology;  Laterality: Bilateral;   KNEE ARTHROSCOPY Right    LEFT HEART CATHETERIZATION WITH CORONARY ANGIOGRAM N/A 02/04/2014   Procedure: LEFT HEART CATHETERIZATION WITH CORONARY ANGIOGRAM;  Surgeon: [name], MD;  Location: [facility];  Service: Cardiovascular;  Laterality: N/A;  severe one-vessel CAD, chronically occluded RCA with faint left-to-right collaterals;  aneurysmal LCFx with sluggish coronary flow;  normal LVSF w/ moderately elevated LVEDP (ostialOM2 20%, pOM3 20%, pD1 20%, mCFX 50%, diffuse 20% pCFX)   PROSTATE BIOPSY N/A 08/12/2020   Procedure: BIOPSY TRANSRECTAL ULTRASONIC PROSTATE (TUBP);  Surgeon: [name], MD;  [facility];  Service: Urology;  Laterality: N/A;   ROBOT ASSITED LAPAROSCOPIC NEPHROURETERECTOMY Right 03/22/2020   Procedure: XI ROBOT ASSITED LAPAROSCOPIC NEPHROURETERECTOMY;  Surgeon: [name], MD;  [facility];  Service: Urology;  Laterality: Right;   TRANSTHORACIC ECHOCARDIOGRAM  11-22-2016   dr hochrein   ef 55-60%/ mild MR and TR/ moderate LAE   TRANSURETHRAL RESECTION OF BLADDER TUMOR N/A 02/10/2021   Procedure: TRANSURETHRAL RESECTION OF BLADDER TUMOR (TURBT)/ CYSTOSCOPY/ LEFT RETROGRADE;  Surgeon: [name], MD;  Location[facility];  Service: Urology;  Laterality: N/A;  GENERAL ANESTHESIA  WITH PARALYSIS   Family History  Problem Relation Age of Onset   Hypertension Mother    Cancer Mother        anal cancer   Esophageal cancer Neg Hx    Pancreatic cancer Neg Hx    Stomach cancer Neg Hx    Liver disease Neg Hx    Outpatient Medications Prior to Visit  Medication Sig Dispense Refill   amLODipine (NORVASC) 5 MG tablet Take 1 tablet (5 mg total) by mouth daily. 90 tablet 3   apixaban (ELIQUIS) 5 MG TABS tablet Take 5 mg by mouth 2 (two) times daily.     furosemide (LASIX) 20 MG tablet TAKE ONE TABLET BY MOUTH DAILY . MAY TAKE EXTRA TABLET IF NEEDED FOR SWELLING (Patient taking differently: Take 20 mg by mouth daily as needed for edema.) 45 tablet 4   isosorbide mononitrate (IMDUR) 30 MG 24 hr tablet Take 1 tablet (30 mg total) by mouth daily. 90 tablet 3   ketorolac (ACULAR) 0.4 % SOLN Apply to eye.     metoprolol tartrate (LOPRESSOR) 25 MG tablet TAKE ONE TABLET BY MOUTH TWICE A DAY 180 tablet 1   nitroGLYCERIN (NITROSTAT) 0.4 MG SL tablet PLACE ONE TABLET UNDER THE TONGUE EVERY 5 MINUTES TIMES THREE DOSES AS NEEDED FOR CHEST PAIN. CALL 911 IF 2ND DOSE DOES NOT HELP 25 tablet 5   ofloxacin (OCUFLOX) 0.3 % ophthalmic solution  olmesartan (BENICAR) 20 MG tablet Take 1 tablet (20 mg total) by mouth daily. 90 tablet 3   prednisoLONE acetate (PRED FORTE) 1 % ophthalmic suspension SMARTSIG:In Eye(s)     rosuvastatin (CRESTOR) 40 MG tablet Take 1 tablet (40 mg total) by mouth every morning. 90 tablet 3   sitaGLIPtin (JANUVIA) 100 MG tablet Take 0.5 tablets (50 mg total) by mouth daily. 90 tablet 3   phenazopyridine (PYRIDIUM) 200 MG tablet Take 1 tablet (200 mg total) by mouth 3 (three) times daily as needed for pain. 10 tablet 0   Facility-Administered Medications Prior to Visit  Medication Dose Route Frequency Provider Last Rate Last Admin   sodium phosphate (FLEET) 7-19 GM/118ML enema 1 enema  1 enema Rectal Once [name], MD       Allergies  Allergen Reactions    Xarelto [Rivaroxaban] Other (See Comments)    Skin Blisters     Objective:   Today's Vitals   12/13/21 1312  BP: 134/80  Pulse: 67  Temp: (!) 97.1 F (36.2 C)  TempSrc: Temporal  SpO2: 98%  Weight: 220 lb 12.8 oz (100.2 kg)  Height: 5\' 11"  (1.803 m)   Body mass index is 30.8 kg/m.   General: Well developed, well nourished. No acute distress. Feet- Skin intact. No sign of maceration between toes. Nails are normal. Dorsalis   pedis and posterior tibial artery pulses are normal. 5.07 monofilament testing with   some area insensate on the left sole. Psych: Alert and oriented. Normal mood and affect.  Health Maintenance Due  Topic Date Due   COVID-19 Vaccine (1) Never done   Zoster Vaccines- Shingrix (1 of 2) Never done   Lab Results Last CBC Lab Results  Component Value Date   WBC 8.8 11/02/2021   HGB 15.5 11/02/2021   HCT 48.4 11/02/2021   MCV 90.5 11/02/2021   MCH 29.0 11/02/2021   RDW 14.1 11/02/2021   PLT 149 (L) 11/21/3233   Last metabolic panel Lab Results  Component Value Date   GLUCOSE 117 (H) 11/02/2021   NA 140 11/02/2021   K 4.8 11/02/2021   CL 109 11/02/2021   CO2 24 11/02/2021   BUN 47 (H) 11/02/2021   CREATININE 2.38 (H) 11/02/2021   GFRNONAA 28 (L) 11/02/2021   CALCIUM 9.2 11/02/2021   PROT 5.5 (L) 11/22/2016   ALBUMIN 4.2 01/07/2020   BILITOT 1.3 (H) 11/22/2016   ALKPHOS 57 11/22/2016   AST 17 12/17/2020   ALT 11 12/17/2020   ANIONGAP 7 11/02/2021   Last lipids Lab Results  Component Value Date   CHOL 114 12/17/2020   HDL 39.30 12/17/2020   LDLCALC 66 12/17/2020   TRIG 43.0 12/17/2020   CHOLHDL 3 12/17/2020   Last hemoglobin A1c Lab Results  Component Value Date   HGBA1C 6.8 (H) 11/02/2021     Assessment & Plan:   1. Essential hypertension Blood pressure is at goal. He will continue on amlodipine and olmesartan. Nephrology will continue to follow and advise regarding need for tighter control.  2. Coronary artery disease  involving native coronary artery of native heart with angina pectoris [facility]) Reviewed prior cardiology notes. The patient's problem list noted him having a cardiac stent, but I find no evidence for this being true. He will continue medical management with isosorbide and rosuvastatin.  3. Mixed hyperlipidemia Last lipids at goal (LDL< 70). Continue rosuvastatin.  4. Chronic diastolic CHF (congestive heart failure), NYHA class 2 (HCC) Compensated. Continue olmesartan, metoprolol and PRN furosemide.  5. Atrial fibrillation with RVR (HCC) Rate well controlled on metoprolol. Continue Eliquis.  6. Type 2 diabetes mellitus with cardiac complication (HCC) P8K has been at goal on sitagliptin. Not due for labs today. Foot exam completed.  7. Diabetic peripheral neuropathy ([facility]) There are some insensate areas to the left foot. I reviewed precautions to reduce risk for diabetic foot infection.  8. Chronic kidney disease, stage IV (severe) ([place]) Followed by nephrology. Continue efforts at good BP control.  Haydee Salter, MD

## 2021-12-21 ENCOUNTER — Other Ambulatory Visit: Payer: Self-pay | Admitting: Cardiology

## 2021-12-23 ENCOUNTER — Telehealth: Payer: Self-pay | Admitting: Cardiology

## 2021-12-23 DIAGNOSIS — I10 Essential (primary) hypertension: Secondary | ICD-10-CM

## 2021-12-23 MED ORDER — AMLODIPINE BESYLATE 5 MG PO TABS: 5.0000 mg | ORAL_TABLET | Freq: Every day | ORAL | 3 refills | Status: DC

## 2021-12-23 NOTE — Telephone Encounter (Signed)
°*  STAT* If patient is at the pharmacy, call can be transferred to refill team.   1. Which medications need to be refilled? (please list name of each medication and dose if known) Amlodipine  2. Which pharmacy/location (including street and city if local pharmacy) is medication to be sent to? [facility], [place]  3. Do they need a 30 day or 90 day supply? 30 days or 90 days if price is not too expensive- Please call today,,he is out of his medicine

## 2022-01-13 ENCOUNTER — Telehealth: Payer: Self-pay | Admitting: Cardiology

## 2022-01-13 ENCOUNTER — Other Ambulatory Visit: Payer: Self-pay | Admitting: Cardiology

## 2022-01-13 DIAGNOSIS — E785 Hyperlipidemia, unspecified: Secondary | ICD-10-CM

## 2022-01-13 MED ORDER — ROSUVASTATIN CALCIUM 40 MG PO TABS: 40.0000 mg | ORAL_TABLET | ORAL | 6 refills | Status: DC

## 2022-01-13 NOTE — Telephone Encounter (Signed)
?*  STAT* If patient is at the pharmacy, call can be transferred to refill team. ? ? ?1. Which medications need to be refilled? (please list name of each medication and dose if known)  ?apixaban (ELIQUIS) 5 MG TABS tablet ?rosuvastatin (CRESTOR) 40 MG tablet ? ?2. Which pharmacy/location (including street and city if local pharmacy) is medication to be sent to?[place], [place] ? ?3. Do they need a 30 day or 90 day supply? 30 day ? ?Patient is out of medication  ?

## 2022-01-13 NOTE — Telephone Encounter (Signed)
Prescription refill request for Eliquis received. ?Indication:Afib ?Last office visit:12/22 ?Scr:2.3 ?Age: 76 ?Weight:100.2 kg ? ?Prescription refilled ? ?

## 2022-02-09 ENCOUNTER — Ambulatory Visit (INDEPENDENT_AMBULATORY_CARE_PROVIDER_SITE_OTHER): Payer: Medicare PPO | Admitting: Family Medicine

## 2022-02-09 ENCOUNTER — Other Ambulatory Visit: Payer: Self-pay

## 2022-02-09 ENCOUNTER — Encounter (HOSPITAL_BASED_OUTPATIENT_CLINIC_OR_DEPARTMENT_OTHER): Payer: Self-pay | Admitting: Emergency Medicine

## 2022-02-09 ENCOUNTER — Emergency Department (HOSPITAL_BASED_OUTPATIENT_CLINIC_OR_DEPARTMENT_OTHER)
Admission: EM | Admit: 2022-02-09 | Discharge: 2022-02-09 | Disposition: A | Discharge status: Home / Self Care | Payer: Medicare PPO | Attending: Emergency Medicine | Admitting: Emergency Medicine

## 2022-02-09 ENCOUNTER — Emergency Department: Payer: Self-pay

## 2022-02-09 ENCOUNTER — Emergency Department (HOSPITAL_BASED_OUTPATIENT_CLINIC_OR_DEPARTMENT_OTHER): Payer: Medicare PPO

## 2022-02-09 VITALS — BP 140/80 | Ht 71.0 in | Wt 218.0 lb

## 2022-02-09 DIAGNOSIS — I4891 Unspecified atrial fibrillation: Secondary | ICD-10-CM | POA: Insufficient documentation

## 2022-02-09 DIAGNOSIS — G8929 Other chronic pain: Secondary | ICD-10-CM | POA: Insufficient documentation

## 2022-02-09 DIAGNOSIS — Z7901 Long term (current) use of anticoagulants: Secondary | ICD-10-CM | POA: Diagnosis not present

## 2022-02-09 DIAGNOSIS — M25461 Effusion, right knee: Secondary | ICD-10-CM | POA: Diagnosis not present

## 2022-02-09 DIAGNOSIS — M1711 Unilateral primary osteoarthritis, right knee: Secondary | ICD-10-CM

## 2022-02-09 DIAGNOSIS — Z79899 Other long term (current) drug therapy: Secondary | ICD-10-CM | POA: Insufficient documentation

## 2022-02-09 DIAGNOSIS — M25561 Pain in right knee: Secondary | ICD-10-CM | POA: Diagnosis present

## 2022-02-09 MED ORDER — TRIAMCINOLONE ACETONIDE 40 MG/ML IJ SUSP
40.0000 mg | Freq: Once | INTRAMUSCULAR | Status: AC
Start: 2022-02-09 — End: 2022-02-09
  Administered 2022-02-09: 40 mg via INTRA_ARTICULAR

## 2022-02-09 NOTE — Assessment & Plan Note (Signed)
Acute on chronic in nature.  Exacerbation of his underlying degenerative changes. ?-Counseled on home exercise therapy and supportive care. ?-Aspiration and injection today. ?-Could consider gel or Zilretta injection. ?

## 2022-02-09 NOTE — ED Provider Notes (Signed)
?[facility] ?Provider Note ? ? ?CSN: [id number] ?Arrival date & time: 02/09/22  1223 ? ?  ? ?History ? ?Chief Complaint  ?Patient presents with  ? Knee Pain  ? ? ?Adam Harris is a 76 y.o. male with arthritis in his right knee presented ED with right knee pain and effusion.  He states this happens every couple of years and he typically has his knee drained.  Per my review of external records, 2021 the patient most recently had his knee aspirated, and the synovial fluid samples were unremarkable.  He reports his symptoms were getting worse the past several days.  He is stable to bear weight at this time.  He is on Eliquis for A-fib. ? ?HPI ? ?  ? ?Home Medications ?Prior to Admission medications   ?Medication Sig Start Date End Date Taking? Authorizing Provider  ?amLODipine (NORVASC) 5 MG tablet Take 1 tablet (5 mg total) by mouth daily. 12/23/21   Minus Breeding, MD  ?ELIQUIS 5 MG TABS tablet TAKE 1 TABLET (5 MG TOTAL) BY MOUTH TWO (TWO) TIMES DAILY. 01/13/22   Minus Breeding, MD  ?furosemide (LASIX) 20 MG tablet TAKE ONE TABLET BY MOUTH DAILY . MAY TAKE EXTRA TABLET IF NEEDED FOR SWELLING ?Patient taking differently: Take 20 mg by mouth daily as needed for edema. 08/19/21   Minus Breeding, MD  ?isosorbide mononitrate (IMDUR) 30 MG 24 hr tablet Take 1 tablet (30 mg total) by mouth daily. 12/17/20   Ronnald Nian, DO  ?ketorolac (ACULAR) 0.4 % SOLN Apply to eye. 12/01/21   [provider]  ?metoprolol tartrate (LOPRESSOR) 25 MG tablet TAKE ONE TABLET BY MOUTH TWICE A DAY 12/22/21   Minus Breeding, MD  ?nitroGLYCERIN (NITROSTAT) 0.4 MG SL tablet PLACE ONE TABLET UNDER THE TONGUE EVERY 5 MINUTES TIMES THREE DOSES AS NEEDED FOR CHEST PAIN. CALL 911 IF 2ND DOSE DOES NOT HELP 06/21/20   Minus Breeding, MD  ?ofloxacin (OCUFLOX) 0.3 % ophthalmic solution  12/01/21   [provider]  ?olmesartan (BENICAR) 20 MG tablet Take 1 tablet (20 mg total) by mouth daily. 12/17/20   Ronnald Nian, DO  ?prednisoLONE acetate (PRED FORTE) 1 % ophthalmic suspension SMARTSIG:In Eye(s) 12/01/21   [provider]  ?rosuvastatin (CRESTOR) 40 MG tablet Take 1 tablet (40 mg total) by mouth every morning. 01/13/22   Minus Breeding, MD  ?sitaGLIPtin (JANUVIA) 100 MG tablet Take 0.5 tablets (50 mg total) by mouth daily. 06/10/21   Ronnald Nian, DO  ?   ? ?Allergies    ?Xarelto [rivaroxaban]   ? ?Review of Systems   ?Review of Systems ? ?Physical Exam ?Updated Vital Signs ?BP (!) 141/100   Pulse 77   Temp 97.9 ?F (36.6 ?C) (Oral)   Resp 18   Ht '5\' 11"'$  (1.803 m)   Wt 99.8 kg   SpO2 100%   BMI 30.68 kg/m?  ?Physical Exam ?Constitutional:   ?   General: He is not in acute distress. ?HENT:  ?   Head: Normocephalic and atraumatic.  ?Eyes:  ?   Conjunctiva/sclera: Conjunctivae normal.  ?   Pupils: Pupils are equal, round, and reactive to light.  ?Cardiovascular:  ?   Rate and Rhythm: Normal rate and regular rhythm.  ?Pulmonary:  ?   Effort: Pulmonary effort is normal. No respiratory distress.  ?Musculoskeletal:  ?   Comments: Mild to moderate right peripatellar knee effusion.  Full range of motion at the right knee.  Patient is  able to bear weight.  No overlying warmth or erythema  ?Skin: ?   General: Skin is warm and dry.  ?Neurological:  ?   General: No focal deficit present.  ?   Mental Status: He is alert. Mental status is at baseline.  ?Psychiatric:     ?   Mood and Affect: Mood normal.     ?   Behavior: Behavior normal.  ? ? ?ED Results / Procedures / Treatments   ?Labs ?(all labs ordered are listed, but only abnormal results are displayed) ?Labs Reviewed - No data to display ? ?EKG ?None ? ?Radiology ?DG Knee Complete 4 Views Right ? ?Result Date: 02/09/2022 ?CLINICAL DATA:  Pain and swelling. EXAM: RIGHT KNEE - COMPLETE 4+ VIEW COMPARISON:  None. FINDINGS: No evidence of fracture or dislocation. Joint effusion is present. Tricompartment degenerative change worse in the medial and patellofemoral  compartment where it is at least moderate. Patellar enthesophytes. Vascular calcifications. IMPRESSION: 1. Joint effusion without identified acute osseous abnormality. 2. Chondrocalcinosis with Tricompartment degenerative change worse in the medial and patellofemoral compartments. Electronically Signed   By: Dahlia Bailiff M.D.   On: 02/09/2022 13:04   ? ?Procedures ?Procedures  ? ? ?Medications Ordered in ED ?Medications - No data to display ? ?ED Course/ Medical Decision Making/ A&P ?  ?                        ?Medical Decision Making ?Amount and/or Complexity of Data Reviewed ?Radiology: ordered. ? ? ?Patient is here with suspected nontraumatic, noninfectious right knee effusion, likely related to his significant tricompartmental osteoarthritis.  I personally viewed and interpreted his x-ray imaging and agree with radiologist interpretation, no acute fracture.  I have a low clinical suspicion for septic joint at this time or for gout.  There is no warmth or inflammation readily evident on exam.  I explained to the patient unfortunately do not do aspirations in the emergency department, that he is generally high risk for aspiration complications given that he is on Eliquis, which puts him at risk for bleeding.  I would refer him back to his PCP if they are comfortable doing this in the office, or also a sports medicine physician and he can reach out to that provider.  He can continue with Tylenol at home.  Recommended elevation and ice at home.  Recommended a walker for additional stability.  He verbalized understanding ? ? ? ? ? ? ? ?Final Clinical Impression(s) / ED Diagnoses ?Final diagnoses:  ?Chronic pain of right knee  ?Effusion of right knee  ? ? ?Rx / DC Orders ?ED Discharge Orders   ? ? None  ? ?  ? ? ?  ?Wyvonnia Dusky, MD ?02/09/22 1342 ? ?

## 2022-02-09 NOTE — Progress Notes (Signed)
?Adam Harris - 76 y.o. male MRN [id number]  Date of birth: [birth year] ? ?SUBJECTIVE:  Including CC & ROS.  ?No chief complaint on file. ? ? ?Adam Harris is a 76 y.o. male that is presenting with acute right knee pain.  He is having swelling of the knee.  He has a history of previous effusion from years ago.  Denies any injury or inciting event.  Localized to the knee.  No redness or warmth. ? ?Independent review of the right knee x-ray from today shows chondrocalcinosis with worsening degenerative changes. ? ?Review of Systems ?See HPI  ? ?HISTORY: Past Medical, Surgical, Social, and Family History Reviewed & Updated per EMR.   ?Pertinent Historical Findings include: ? ?Past Medical History:  ?Diagnosis Date  ? Abnormal radiologic findings on diagnostic imaging of renal pelvis, ureter, or bladder   ? bilateral ureter abnormalities  ? Anticoagulant long-term use   ? eliquis  ? Anxiety   ? Arthritis   ? CAD (coronary artery disease) cardiologist-- dr hochrein  ? NSTEMI 02-04-2014  per cardiac cath chronic occluded RCA w/ faint left-to-right collaterals and aneurysmal LCFx with sluggish coronary flow/   NSTEMI --11-21-2016 per cardiac cath occluded proximal RCA & mid to diastal CFX 100%, med rx. If that does not work, PTCA or CABG  ? Cancer [facility])   ? bladder and kidney  ? CKD (chronic kidney disease), stage III ([place])   ? patient unaware  ? DOE (dyspnea on exertion)   ? 2-3 flights of stairs  ? Fatty liver   ? pt denies  ? Hematuria 02/2019  ? History of COVID-19 10/2019  ? History of non-ST elevation myocardial infarction (NSTEMI)   ? 02-04-2014  and 11-21-2016  cardiac cath done both times ,  medically management  ? History of shingles 12/2017  ? slight pain and numbness still noted in the area  ? Hyperlipidemia   ? Hypertension   ? Insomnia   ? Myocardial infarction [facility]) 2015  ? Persistent atrial fibrillation ([place])   ? cardiologsit-- dr hochrein  ? Thoracic aortic atherosclerosis ([place])   ? Type 2 diabetes mellitus ([place])    ? Urinary frequency   ? ? ?Past Surgical History:  ?Procedure Laterality Date  ? CARDIAC CATHETERIZATION N/A 11/21/2016  ? Procedure: Left Heart Cath and Coronary Angiography;  Surgeon: Troy Sine, MD;  Location: [place] [facility];  Service: Cardiovascular;  Laterality: N/A;  pRCA 100% , ostial LAD 45%, OM3 80%, mCFX to dCFX 100% (AV groove), lateral OM3 50%  ? COLONOSCOPY    ? CYSTOSCOPY WITH BIOPSY N/A 08/12/2020  ? Procedure: CYSTOSCOPY WITH BLADDER BIOPSY AND TRANSURETHRAL RESECTION OF BLADDER TUMOR;  Surgeon: [name], MD;  [facility];  Service: Urology;  Laterality: N/A;  ? CYSTOSCOPY WITH BIOPSY N/A 11/10/2021  ? Procedure: CYSTOSCOPY WITH BLADDER BIOPSIES/ LEFT RETROGRADE;  Surgeon: [name], MD;  [facility];  Service: Urology;  Laterality: N/A;  ? CYSTOSCOPY WITH URETEROSCOPY AND STENT PLACEMENT Right 12/15/2019  ? Procedure: CYSTOSCOPY WITH RIGHT RETROGRADE/ RIGHT URETEROSCOPY/ BIOPSY;  Surgeon: [name], MD;  Location: [facility];  Service: Urology;  Laterality: Right;  ? CYSTOSCOPY/RETROGRADE/URETEROSCOPY Bilateral 03/18/2018  ? Procedure: CYSTOSCOPY/RETROGRADE/URETEROSCOPY.;  Surgeon: [name], MD;  Location: [facility];  Service: Urology;  Laterality: Bilateral;  ? KNEE ARTHROSCOPY Right   ? LEFT HEART CATHETERIZATION WITH CORONARY ANGIOGRAM N/A 02/04/2014  ? Procedure: LEFT HEART CATHETERIZATION WITH CORONARY ANGIOGRAM;  Surgeon: [name], MD;  Location: [place] [facility];  Service: Cardiovascular;  Laterality: N/A;  severe one-vessel CAD, chronically occluded RCA with faint left-to-right collaterals;  aneurysmal LCFx with sluggish coronary flow;  normal LVSF w/ moderately elevated LVEDP (ostialOM2 20%, pOM3 20%, pD1 20%, mCFX 50%, diffuse 20% pCFX)  ? PROSTATE BIOPSY N/A 08/12/2020  ? Procedure: BIOPSY TRANSRECTAL ULTRASONIC PROSTATE (TUBP);  Surgeon: [name], MD;  [facility];  Service: Urology;  Laterality: N/A;   ? ROBOT ASSITED LAPAROSCOPIC NEPHROURETERECTOMY Right 03/22/2020  ? Procedure: XI ROBOT ASSITED LAPAROSCOPIC NEPHROURETERECTOMY;  Surgeon: [name], MD;  [facility];  Service: Urology;  Laterality: Right;  ? TRANSTHORACIC ECHOCARDIOGRAM  11-22-2016   dr hochrein  ? ef 55-60%/ mild MR and TR/ moderate LAE  ? TRANSURETHRAL RESECTION OF BLADDER TUMOR N/A 02/10/2021  ? Procedure: TRANSURETHRAL RESECTION OF BLADDER TUMOR (TURBT)/ CYSTOSCOPY/ LEFT RETROGRADE;  Surgeon: [name], MD;  [facility];  Service: Urology;  Laterality: N/A;  GENERAL ANESTHESIA WITH PARALYSIS  ? ? ? ?PHYSICAL EXAM:  ?VS: BP 140/80   Ht '5\' 11"'$  (1.803 m)   Wt 218 lb (98.9 kg)   BMI 30.40 kg/m?  ?Physical Exam ?Gen: NAD, alert, cooperative with exam, well-appearing ?MSK:  ?Neurovascularly intact   ? ? ?Aspiration/Injection Procedure Note ?Adam Harris ?[birth year] ? ?Procedure: Aspiration and Injection ?Indications: Right knee pain ? ?Procedure Details ?Consent: Risks of procedure as well as the alternatives and risks of each were explained to the (patient/caregiver).  Consent for procedure obtained. ?Time Out: Verified patient identification, verified procedure, site/side was marked, verified correct patient position, special equipment/implants available, medications/allergies/relevent history reviewed, required imaging and test results available.  Performed.  The area was cleaned with iodine and alcohol swabs.   ? ?The right knee superior lateral suprapatellar pouch was injected using 3 cc of 1% lidocaine on a 25-gauge 1-1/2 inch needle.  An 18-gauge 1-1/2 inch needle was used to achieve aspiration.  The syringe was switched and a mixture containing 1 cc's of 40 mg Kenalog and 4 cc's of 0.25% bupivacaine was injected.  Ultrasound was used. Images were obtained in long views showing the injection.   ? ?Amount of Fluid Aspirated:  46m ?Character of Fluid: clear and straw colored ?Fluid was sent for: n/a ? ?A sterile dressing was  applied. ? ?Patient did tolerate procedure well. ? ? ? ? ?ASSESSMENT & PLAN:  ? ?OA (osteoarthritis) of knee ?Acute on chronic in nature.  Exacerbation of his underlying degenerative changes. ?-Counseled on home exercise therapy and supportive care. ?-Aspiration and injection today. ?-Could consider gel or Zilretta injection. ? ? ? ? ?

## 2022-02-09 NOTE — Patient Instructions (Signed)
Good to see you Please use ice as needed   Please send me a message in MyChart with any questions or updates.  Please see me back in 4-6 weeks.   --Dr. Windsor Zirkelbach  

## 2022-02-09 NOTE — Discharge Instructions (Addendum)
If your doctor cannot arrange to have your knee drained in the next week or two, you can call to follow up with the sports medicine physician here. ? ?I strongly recommend that you buy a cane or a walker for extra stability at home, to make sure your knee does not buckle or give out on you. ?

## 2022-02-09 NOTE — ED Triage Notes (Signed)
Patient c/o right knee pain and swelling x 3 days. Reports having to have fluid drained from same knee a few times.  ?

## 2022-02-22 ENCOUNTER — Telehealth: Payer: Self-pay | Admitting: Family Medicine

## 2022-02-22 DIAGNOSIS — N1831 Chronic kidney disease, stage 3a: Secondary | ICD-10-CM

## 2022-02-22 DIAGNOSIS — I10 Essential (primary) hypertension: Secondary | ICD-10-CM

## 2022-02-22 MED ORDER — OLMESARTAN MEDOXOMIL 20 MG PO TABS: 20.0000 mg | ORAL_TABLET | Freq: Every day | ORAL | 0 refills | Status: DC

## 2022-02-22 MED ORDER — SITAGLIPTIN PHOSPHATE 100 MG PO TABS: 50.0000 mg | ORAL_TABLET | Freq: Every day | ORAL | 0 refills | Status: DC

## 2022-02-22 NOTE — Telephone Encounter (Signed)
Spoke to patient's wife, Adam Harris,  ?Advised that the Rosuvastatin was sent in by Dr Cleotis Nipper and the Olmesartan and Januvia will be snt in due to having an up coming appointment.   Dm/cma ? ?

## 2022-02-22 NOTE — Telephone Encounter (Signed)
Caller Name: jermain curt ?Call back phone #: [phone] ? ?MEDICATION(S): rosuvastatin (CRESTOR) 40 MG tablet [[id number]] ?Olmesartan ?Januvia-his kidney doc is wanting this pill cut in half because of his kidney function ? ? ? ? ?Days of Med Remaining:  ? ?Has the patient contacted their pharmacy (YES/NO)?  yes ?IF YES, when and what did the pharmacy advise? Contact your PCP ?IF NO, request that the patient contact the pharmacy for the refills in the future.  ?           The pharmacy will send an electronic request (except for controlled medications). ? ?Preferred Pharmacy: [facility] [address]  ?[address], [place] [address]  ?Phone:  [phone]  Fax:  [phone]  ? ?~~~Please advise patient/caregiver to allow 2-3 business days to process RX refills. ? ?

## 2022-02-22 NOTE — Telephone Encounter (Signed)
Patient has called and stated he has no more refills. The pharmacist also called and told the same. ?

## 2022-02-27 DIAGNOSIS — M160 Bilateral primary osteoarthritis of hip: Secondary | ICD-10-CM | POA: Diagnosis not present

## 2022-02-27 DIAGNOSIS — J8410 Pulmonary fibrosis, unspecified: Secondary | ICD-10-CM | POA: Diagnosis not present

## 2022-02-27 DIAGNOSIS — C678 Malignant neoplasm of overlapping sites of bladder: Secondary | ICD-10-CM | POA: Diagnosis not present

## 2022-02-27 DIAGNOSIS — C651 Malignant neoplasm of right renal pelvis: Secondary | ICD-10-CM | POA: Diagnosis not present

## 2022-02-27 DIAGNOSIS — J929 Pleural plaque without asbestos: Secondary | ICD-10-CM | POA: Diagnosis not present

## 2022-02-27 DIAGNOSIS — C679 Malignant neoplasm of bladder, unspecified: Secondary | ICD-10-CM | POA: Diagnosis not present

## 2022-02-27 DIAGNOSIS — Z85528 Personal history of other malignant neoplasm of kidney: Secondary | ICD-10-CM | POA: Diagnosis not present

## 2022-02-27 DIAGNOSIS — D090 Carcinoma in situ of bladder: Secondary | ICD-10-CM | POA: Diagnosis not present

## 2022-02-27 DIAGNOSIS — N3289 Other specified disorders of bladder: Secondary | ICD-10-CM | POA: Diagnosis not present

## 2022-02-27 DIAGNOSIS — C61 Malignant neoplasm of prostate: Secondary | ICD-10-CM | POA: Diagnosis not present

## 2022-02-27 DIAGNOSIS — I2510 Atherosclerotic heart disease of native coronary artery without angina pectoris: Secondary | ICD-10-CM | POA: Diagnosis not present

## 2022-03-01 ENCOUNTER — Other Ambulatory Visit: Payer: Self-pay | Admitting: Family Medicine

## 2022-03-01 DIAGNOSIS — I10 Essential (primary) hypertension: Secondary | ICD-10-CM

## 2022-03-03 ENCOUNTER — Telehealth: Payer: Self-pay | Admitting: Cardiology

## 2022-03-03 DIAGNOSIS — C678 Malignant neoplasm of overlapping sites of bladder: Secondary | ICD-10-CM | POA: Diagnosis not present

## 2022-03-03 NOTE — Telephone Encounter (Signed)
?  Patient calling the office for samples of medication: ? ? ?1.  What medication and dosage are you requesting samples for?ELIQUIS 5 MG TABS tablet ? ?2.  Are you currently out of this medication? Yes ? ?Pt's spouse wondering if they can pick it up later today ?  ?

## 2022-03-06 DIAGNOSIS — I252 Old myocardial infarction: Secondary | ICD-10-CM | POA: Diagnosis not present

## 2022-03-06 DIAGNOSIS — I25119 Atherosclerotic heart disease of native coronary artery with unspecified angina pectoris: Secondary | ICD-10-CM | POA: Diagnosis not present

## 2022-03-06 DIAGNOSIS — E785 Hyperlipidemia, unspecified: Secondary | ICD-10-CM | POA: Diagnosis not present

## 2022-03-06 DIAGNOSIS — E669 Obesity, unspecified: Secondary | ICD-10-CM | POA: Diagnosis not present

## 2022-03-06 DIAGNOSIS — I509 Heart failure, unspecified: Secondary | ICD-10-CM | POA: Diagnosis not present

## 2022-03-06 DIAGNOSIS — H409 Unspecified glaucoma: Secondary | ICD-10-CM | POA: Diagnosis not present

## 2022-03-06 DIAGNOSIS — I110 Hypertensive heart disease with heart failure: Secondary | ICD-10-CM | POA: Diagnosis not present

## 2022-03-06 DIAGNOSIS — E119 Type 2 diabetes mellitus without complications: Secondary | ICD-10-CM | POA: Diagnosis not present

## 2022-03-06 DIAGNOSIS — I4891 Unspecified atrial fibrillation: Secondary | ICD-10-CM | POA: Diagnosis not present

## 2022-03-14 ENCOUNTER — Ambulatory Visit: Payer: Medicare PPO | Admitting: Family Medicine

## 2022-05-02 ENCOUNTER — Encounter: Payer: Self-pay | Admitting: Family Medicine

## 2022-05-02 ENCOUNTER — Ambulatory Visit (INDEPENDENT_AMBULATORY_CARE_PROVIDER_SITE_OTHER): Payer: Medicare PPO | Admitting: Family Medicine

## 2022-05-02 VITALS — BP 126/74 | HR 65 | Temp 96.9°F | Ht 71.0 in | Wt 221.4 lb

## 2022-05-02 DIAGNOSIS — D090 Carcinoma in situ of bladder: Secondary | ICD-10-CM

## 2022-05-02 DIAGNOSIS — E1159 Type 2 diabetes mellitus with other circulatory complications: Secondary | ICD-10-CM

## 2022-05-02 DIAGNOSIS — I10 Essential (primary) hypertension: Secondary | ICD-10-CM

## 2022-05-02 DIAGNOSIS — I4891 Unspecified atrial fibrillation: Secondary | ICD-10-CM

## 2022-05-02 DIAGNOSIS — I5032 Chronic diastolic (congestive) heart failure: Secondary | ICD-10-CM

## 2022-05-02 DIAGNOSIS — Z8546 Personal history of malignant neoplasm of prostate: Secondary | ICD-10-CM | POA: Diagnosis not present

## 2022-05-02 DIAGNOSIS — N184 Chronic kidney disease, stage 4 (severe): Secondary | ICD-10-CM | POA: Diagnosis not present

## 2022-05-02 DIAGNOSIS — I25119 Atherosclerotic heart disease of native coronary artery with unspecified angina pectoris: Secondary | ICD-10-CM | POA: Diagnosis not present

## 2022-05-02 DIAGNOSIS — E782 Mixed hyperlipidemia: Secondary | ICD-10-CM

## 2022-05-02 LAB — BASIC METABOLIC PANEL
BUN: 35 mg/dL — ABNORMAL HIGH (ref 6–23)
CO2: 26 mEq/L (ref 19–32)
Calcium: 9.6 mg/dL (ref 8.4–10.5)
Chloride: 107 mEq/L (ref 96–112)
Creatinine, Ser: 2.34 mg/dL — ABNORMAL HIGH (ref 0.40–1.50)
GFR: 26.55 mL/min — ABNORMAL LOW (ref >60.00)
Glucose, Bld: 113 mg/dL — ABNORMAL HIGH (ref 70–99)
Potassium: 5.3 mEq/L — ABNORMAL HIGH (ref 3.5–5.1)
Sodium: 140 mEq/L (ref 135–145)

## 2022-05-02 LAB — LIPID PANEL
Cholesterol: 100 mg/dL (ref 0–200)
HDL: 35.8 mg/dL — ABNORMAL LOW (ref >39.00)
LDL Cholesterol: 54 mg/dL (ref 0–99)
NonHDL: 64.06
Total CHOL/HDL Ratio: 3
Triglycerides: 51 mg/dL (ref 0.0–149.0)
VLDL: 10.2 mg/dL (ref 0.0–40.0)

## 2022-05-02 LAB — HEMOGLOBIN A1C: Hgb A1c MFr Bld: 7.1 % — ABNORMAL HIGH (ref 4.6–6.5)

## 2022-05-02 LAB — MICROALBUMIN / CREATININE URINE RATIO
Creatinine,U: 26.1 mg/dL
Microalb Creat Ratio: 27.8 mg/g (ref 0.0–30.0)
Microalb, Ur: 7.3 mg/dL — ABNORMAL HIGH (ref 0.0–1.9)

## 2022-05-02 LAB — TSH: TSH: 4.75 u[IU]/mL (ref 0.35–5.50)

## 2022-05-02 MED ORDER — SITAGLIPTIN PHOSPHATE 25 MG PO TABS: 25.0000 mg | ORAL_TABLET | Freq: Every day | ORAL | 3 refills | Status: DC

## 2022-05-02 NOTE — Progress Notes (Signed)
[facility] [address] [place] [address] Dept: [phone] Dept Fax: [phone]  Chronic Care Office Visit  Subjective:    Patient ID: Adam Harris, male    DOB: 1946/06/10, 76 y.o..   MRN: [id number]  Chief Complaint  Patient presents with   Follow-up    3 month f/u. C/o swelling in both lower legs 1-2 weeks.  fasting today.    History of Present Illness:  Patient is in today for reassessment of chronic medical issues.  Mr. Sayas has a history of atrial fibrillation, hypertension, coronary artery disease, and chronic diastolic heart failure. He is managed on amlodipine 5 mg daily and olmesartan  20 mg daily for his blood pressure and CHF. He takes furosemide as needed. He notes his legs have been swollen quite a bit more the last 2 weeks, but admits he only took 1 0r 2 doses of Lasix. He is on isosorbide 30 mg daily for management of angina. He takes Eliquis 5 mg daily for anticoagulation related to his a. fib. He also has hyperlipidemia and is managed on rosuvastatin 40 mg daily.   Mr. Baxendale has a history of Type 2 diabetes. He is managed on sitagliptin (Januvia) 50 mg daily.  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 He is followed by nephrology for this.   Mr. 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 He did not require treatment and this is being followed by Dr. [name] (urology).   Mr. 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 He has had a right nephroureterectomy. He had recent biopsies of the bladder without any finding of cancer at this point.  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[place]) 03/22/2020   Chronic diastolic CHF (congestive heart failure), NYHA class 2 ([place]) 04/02/2017   Atrial fibrillation with RVR ([place]) 11/20/2016   OA (osteoarthritis) of knee 05/31/2016   Low back pain 05/31/2016   Exertional angina ([place]) 10/29/2014   Chronic kidney disease, stage IV (severe) ([place]) 10/29/2014   Coronary artery disease involving native coronary artery of native heart with angina pectoris ([place]) 03/09/2014   Type 2 diabetes mellitus with cardiac complication ([place]) 56/81/2751   Hyperlipidemia 02/05/2014   History of non-ST elevation myocardial infarction (NSTEMI) 02/03/2014   Essential hypertension 02/03/2014   Anxiety state 04/19/2013   History of prostate cancer 04/19/2013   Insomnia 04/19/2013   Stiffness of joint, lower leg 04/19/2013   Sciatica 04/19/2013   Past Surgical History:  Procedure Laterality Date   CARDIAC CATHETERIZATION N/A 11/21/2016   Procedure: Left Heart Cath and Coronary Angiography;  Surgeon: Troy Sine, MD;  Location: [place] [facility];  Service: Cardiovascular;  Laterality: N/A;  pRCA 100% , ostial LAD 45%, OM3 80%, mCFX to dCFX 100% (AV groove), lateral OM3 50%   COLONOSCOPY     CYSTOSCOPY WITH BIOPSY N/A 08/12/2020   Procedure: CYSTOSCOPY WITH BLADDER BIOPSY AND TRANSURETHRAL RESECTION OF BLADDER TUMOR;  Surgeon: [name], MD;  [facility];  Service: Urology;  Laterality: N/A;   CYSTOSCOPY WITH BIOPSY N/A 11/10/2021   Procedure: CYSTOSCOPY WITH BLADDER  BIOPSIES/ LEFT RETROGRADE;  Surgeon: [name], MD;  [facility];  Service: Urology;  Laterality: N/A;   CYSTOSCOPY WITH URETEROSCOPY AND STENT PLACEMENT Right 12/15/2019   Procedure: CYSTOSCOPY WITH RIGHT RETROGRADE/ RIGHT URETEROSCOPY/ BIOPSY;  Surgeon: [name], MD;  Location: [place];  Service: Urology;  Laterality: Right;   CYSTOSCOPY/RETROGRADE/URETEROSCOPY Bilateral  03/18/2018   Procedure: CYSTOSCOPY/RETROGRADE/URETEROSCOPY.;  Surgeon: [name], MD;  Location: [name];  Service: Urology;  Laterality: Bilateral;   EYE SURGERY Bilateral    cateract in January 2023   KNEE ARTHROSCOPY Right    LEFT HEART CATHETERIZATION WITH CORONARY ANGIOGRAM N/A 02/04/2014   Procedure: LEFT HEART CATHETERIZATION WITH CORONARY ANGIOGRAM;  Surgeon: [name], MD;  Location: [place] [facility];  Service: Cardiovascular;  Laterality: N/A;  severe one-vessel CAD, chronically occluded RCA with faint left-to-right collaterals;  aneurysmal LCFx with sluggish coronary flow;  normal LVSF w/ moderately elevated LVEDP (ostialOM2 20%, pOM3 20%, pD1 20%, mCFX 50%, diffuse 20% pCFX)   PROSTATE BIOPSY N/A 08/12/2020   Procedure: BIOPSY TRANSRECTAL ULTRASONIC PROSTATE (TUBP);  Surgeon: [name], MD;  [facility];  Service: Urology;  Laterality: N/A;   ROBOT ASSITED LAPAROSCOPIC NEPHROURETERECTOMY Right 03/22/2020   Procedure: XI ROBOT ASSITED LAPAROSCOPIC NEPHROURETERECTOMY;  Surgeon: [name], MD;  [facility];  Service: Urology;  Laterality: Right;   TRANSTHORACIC ECHOCARDIOGRAM  11-22-2016   dr hochrein   ef 55-60%/ mild MR and TR/ moderate LAE   TRANSURETHRAL RESECTION OF BLADDER TUMOR N/A 02/10/2021   Procedure: TRANSURETHRAL RESECTION OF BLADDER TUMOR (TURBT)/ CYSTOSCOPY/ LEFT RETROGRADE;  Surgeon: [name], MD;  [facility];  Service: Urology;  Laterality: N/A;  GENERAL ANESTHESIA WITH PARALYSIS   Family History  Problem Relation Age of Onset   Hypertension Mother    Cancer Mother        anal cancer   Esophageal cancer Neg Hx    Pancreatic cancer Neg Hx    Stomach cancer Neg Hx    Liver disease Neg Hx    Outpatient Medications Prior to Visit  Medication Sig Dispense Refill   amLODipine (NORVASC) 5 MG tablet Take 1 tablet (5 mg total) by mouth daily. 90 tablet 3   ELIQUIS 5 MG TABS tablet TAKE 1 TABLET (5 MG TOTAL) BY  MOUTH TWO (TWO) TIMES DAILY. 180 tablet 1   furosemide (LASIX) 20 MG tablet TAKE ONE TABLET BY MOUTH DAILY . MAY TAKE EXTRA TABLET IF NEEDED FOR SWELLING (Patient taking differently: Take 20 mg by mouth daily as needed for edema.) 45 tablet 4   isosorbide mononitrate (IMDUR) 30 MG 24 hr tablet TAKE 1 TABLET (30 MG TOTAL) BY MOUTH DAILY. 90 tablet 0   metoprolol tartrate (LOPRESSOR) 25 MG tablet TAKE ONE TABLET BY MOUTH TWICE A DAY 180 tablet 0   nitroGLYCERIN (NITROSTAT) 0.4 MG SL tablet PLACE ONE TABLET UNDER THE TONGUE EVERY 5 MINUTES TIMES THREE DOSES AS NEEDED FOR CHEST PAIN. CALL 911 IF 2ND DOSE DOES NOT HELP 25 tablet 5   olmesartan (BENICAR) 20 MG tablet Take 1 tablet (20 mg total) by mouth daily. 90 tablet 0   rosuvastatin (CRESTOR) 40 MG tablet Take 1 tablet (40 mg total) by mouth every morning. 30 tablet 6   ketorolac (ACULAR) 0.4 % SOLN Apply to eye.     ofloxacin (OCUFLOX) 0.3 % ophthalmic solution      prednisoLONE acetate (PRED FORTE) 1 % ophthalmic suspension SMARTSIG:In Eye(s)     sitaGLIPtin (JANUVIA) 100 MG  tablet Take 0.5 tablets (50 mg total) by mouth daily. 90 tablet 0   sodium phosphate (FLEET) 7-19 GM/118ML enema 1 enema      No facility-administered medications prior to visit.   Allergies  Allergen Reactions   Xarelto [Rivaroxaban] Other (See Comments)    Skin Blisters      Objective:   Today's Vitals   05/02/22 1008  BP: 126/74  Pulse: 65  Temp: (!) 96.9 F (36.1 C)  TempSrc: Temporal  SpO2: 96%  Weight: 221 lb 6.4 oz (100.4 kg)  Height: '5\' 11"'$  (1.803 m)   Body mass index is 30.88 kg/m.   General: Well developed, well nourished. No acute distress. CV: RRR without murmurs or rubs. Pulses 2+ bilaterally. Extremities: 3-4+ edema noted. Psych: Alert and oriented. Normal mood and affect.  Health Maintenance Due  Topic Date Due   Zoster Vaccines- Shingrix (1 of 2) Never done     Assessment & Plan:   1. Type 2 diabetes mellitus with cardiac  complication (HCC) Due for annual DM labs. Nephrology has asked that his sitagliptin dose be lowered to 25 mg due to his kidney disease. I will check his A1c today. We will lower his dose. If his A1c is above goal, we would consider addition of an additional agent. We might consider adding an SGLT2i, as his GFR is >20. This would also potentially benefit his heart failure.  - Basic metabolic panel - Hemoglobin A1c - sitaGLIPtin (JANUVIA) 25 MG tablet; Take 1 tablet (25 mg total) by mouth daily.  Dispense: 90 tablet; Refill: 3 - Microalbumin / creatinine urine ratio  2. Essential hypertension Blood pressure is at goal. Continue amlodipine 5 mg and olmesartan 20 mg. The amlodipine could be contributing to his lower leg edema, so might consider stopping this if the edema persists.  3. Coronary artery disease involving native coronary artery of native heart with angina pectoris (HCC) Stable. Continue isosorbide.  4. Atrial fibrillation with RVR (HCC) Rate in good control. Continue Eliquis.  - TSH  5. Chronic diastolic CHF (congestive heart failure), NYHA class 2 (HCC) Weight is stable despite increased edema and patient has no dyspnea or rales on lung exam. The CHF appears stable. I recommended he start taking his Lasix daily for th next few weeks. I also strongly recommended that he elevate his legs some every day.  - Basic metabolic panel - TSH  6. Mixed hyperlipidemia Due for lipids. Cotninue rosuvastatin 40 mg daily.  - Lipid panel  7. Chronic kidney disease, stage IV (severe) (HCC) We will reassess his kidney function today.  - Basic metabolic panel  8. Carcinoma in situ of bladder 9. History of prostate cancer Continue to follow with urology.  Return in about 3 months (around 08/02/2022) for Reassessment.   Haydee Salter, MD

## 2022-05-04 ENCOUNTER — Telehealth: Payer: Self-pay | Admitting: Family Medicine

## 2022-05-04 NOTE — Telephone Encounter (Signed)
Pt's spouse Manuela Schwartz) is wanting a call back concerning her husband's most recent lab results. Please advise at [phone]

## 2022-05-05 NOTE — Telephone Encounter (Signed)
Unable to leave VM due to mail box is full.  Dm/cma  

## 2022-05-12 ENCOUNTER — Telehealth: Payer: Self-pay | Admitting: Cardiology

## 2022-05-12 NOTE — Telephone Encounter (Signed)
Pt c/o swelling: STAT is pt has developed SOB within 24 hours  If swelling, where is the swelling located? Both feet and ankles  How much weight have you gained and in what time span? Did not know  Have you gained 3 pounds in a day or 5 pounds in a week? No  Do you have a log of your daily weights (if so, list)?   No  Are you currently taking a fluid pill? Yes  Are you currently SOB? No  Have you traveled recently? No   Patient stated he has been having swelling in his feet and ankles.  Started taking fluid pill about a week ago and has seen very little difference.

## 2022-05-12 NOTE — Telephone Encounter (Signed)
Spoke to patient he stated he has been having swelling in both feet and ankles for the past 2 weeks.No sob.Advised to decrease salt.Elevate feet when sitting.Appointment scheduled with Dr.Hochrein 7/6 at 3:30 pm.

## 2022-05-17 NOTE — Progress Notes (Signed)
Cardiology Office Note   Date:  05/18/2022   ID:  Adam Harris, DOB Aug 26, 1946, MRN [id number]  PCP:  Haydee Salter, MD  Cardiologist:   Minus Breeding, MD    Chief Complaint  Patient presents with   Edema      History of Present Illness: Adam Harris is a 76 y.o. male who presents for atrial fib and CAD and atrial fibrillation.  Most recent cardiac cath  01/2014 revealed severe 1 vessel disease with CTO RCA with faint left to right collaterals, aneurysmal LCFx with sluggish coronary flow;  normal LVSF w/ moderately elevated LVEDP (ostialOM2 20%, pOM3 20%, pD1 20%, mCFX 50%, diffuse 20% pCFX). Chronic diastolic CHF, atrial fib and HL.   Since I last saw him he has had some increased lower extremity swelling.  His weights are up only about 4 pounds.  However, he said that the past few weeks or so he has had increased swelling.  He does take 20 mg of Lasix daily.  He is not particularly watching his salt.  He does not drink a ton of fluid.  He is not having any new shortness of breath, PND or orthopnea.  He is not having any new palpitations, presyncope or syncope.   Past Medical History:  Diagnosis Date   Abnormal radiologic findings on diagnostic imaging of renal pelvis, ureter, or bladder    bilateral ureter abnormalities   Anticoagulant long-term use    eliquis   Anxiety    Arthritis    CAD (coronary artery disease) cardiologist-- dr Manvir Thorson   NSTEMI 02-04-2014  per cardiac cath chronic occluded RCA w/ faint left-to-right collaterals and aneurysmal LCFx with sluggish coronary flow/   NSTEMI --11-21-2016 per cardiac cath occluded proximal RCA & mid to diastal CFX 100%, med rx. If that does not work, PTCA or CABG   Cancer [facility])    bladder and kidney   CKD (chronic kidney disease), stage III ([place])    patient unaware   DOE (dyspnea on exertion)    2-3 flights of stairs   Fatty liver    pt denies   Hematuria 02/2019   History of COVID-19 10/2019   History of non-ST elevation  myocardial infarction (NSTEMI)    02-04-2014  and 11-21-2016  cardiac cath done both times ,  medically management   History of shingles 12/2017   slight pain and numbness still noted in the area   Hyperlipidemia    Hypertension    Insomnia    Myocardial infarction [facility]) 2015   Persistent atrial fibrillation [facility])    cardiologsit-- dr Janssen Zee   Thoracic aortic atherosclerosis ([place])    Type 2 diabetes mellitus ([place])    Urinary frequency     Past Surgical History:  Procedure Laterality Date   CARDIAC CATHETERIZATION N/A 11/21/2016   Procedure: Left Heart Cath and Coronary Angiography;  Surgeon: Troy Sine, MD;  Location: [facility];  Service: Cardiovascular;  Laterality: N/A;  pRCA 100% , ostial LAD 45%, OM3 80%, mCFX to dCFX 100% (AV groove), lateral OM3 50%   COLONOSCOPY     CYSTOSCOPY WITH BIOPSY N/A 08/12/2020   Procedure: CYSTOSCOPY WITH BLADDER BIOPSY AND TRANSURETHRAL RESECTION OF BLADDER TUMOR;  Surgeon: [name], MD;  Location: [facility];  Service: Urology;  Laterality: N/A;   CYSTOSCOPY WITH BIOPSY N/A 11/10/2021   Procedure: CYSTOSCOPY WITH BLADDER BIOPSIES/ LEFT RETROGRADE;  Surgeon: [name], MD;  Location: [facility];  Service: Urology;  Laterality: N/A;  CYSTOSCOPY WITH URETEROSCOPY AND STENT PLACEMENT Right 12/15/2019   Procedure: CYSTOSCOPY WITH RIGHT RETROGRADE/ RIGHT URETEROSCOPY/ BIOPSY;  Surgeon: [name], MD;  Location: [facility];  Service: Urology;  Laterality: Right;   CYSTOSCOPY/RETROGRADE/URETEROSCOPY Bilateral 03/18/2018   Procedure: CYSTOSCOPY/RETROGRADE/URETEROSCOPY.;  Surgeon: [name], MD;  Location: [facility];  Service: Urology;  Laterality: Bilateral;   EYE SURGERY Bilateral    cateract in January 2023   KNEE ARTHROSCOPY Right    LEFT HEART CATHETERIZATION WITH CORONARY ANGIOGRAM N/A 02/04/2014   Procedure: LEFT HEART CATHETERIZATION WITH CORONARY ANGIOGRAM;  Surgeon: [name], MD;   Location: [facility];  Service: Cardiovascular;  Laterality: N/A;  severe one-vessel CAD, chronically occluded RCA with faint left-to-right collaterals;  aneurysmal LCFx with sluggish coronary flow;  normal LVSF w/ moderately elevated LVEDP (ostialOM2 20%, pOM3 20%, pD1 20%, mCFX 50%, diffuse 20% pCFX)   PROSTATE BIOPSY N/A 08/12/2020   Procedure: BIOPSY TRANSRECTAL ULTRASONIC PROSTATE (TUBP);  Surgeon: [name], MD;  [facility];  Service: Urology;  Laterality: N/A;   ROBOT ASSITED LAPAROSCOPIC NEPHROURETERECTOMY Right 03/22/2020   Procedure: XI ROBOT ASSITED LAPAROSCOPIC NEPHROURETERECTOMY;  Surgeon: [name], MD;  Location: [facility];  Service: Urology;  Laterality: Right;   TRANSTHORACIC ECHOCARDIOGRAM  11-22-2016   dr Aaden Buckman   ef 55-60%/ mild MR and TR/ moderate LAE   TRANSURETHRAL RESECTION OF BLADDER TUMOR N/A 02/10/2021   Procedure: TRANSURETHRAL RESECTION OF BLADDER TUMOR (TURBT)/ CYSTOSCOPY/ LEFT RETROGRADE;  Surgeon: [name], MD;  Location[facility];  Service: Urology;  Laterality: N/A;  GENERAL ANESTHESIA WITH PARALYSIS     Current Outpatient Medications  Medication Sig Dispense Refill   amLODipine (NORVASC) 5 MG tablet Take 1 tablet (5 mg total) by mouth daily. 90 tablet 3   ELIQUIS 5 MG TABS tablet TAKE 1 TABLET (5 MG TOTAL) BY MOUTH TWO (TWO) TIMES DAILY. 180 tablet 1   furosemide (LASIX) 20 MG tablet TAKE ONE TABLET BY MOUTH DAILY . MAY TAKE EXTRA TABLET IF NEEDED FOR SWELLING (Patient taking differently: Take 20 mg by mouth daily as needed for edema.) 45 tablet 4   isosorbide mononitrate (IMDUR) 30 MG 24 hr tablet TAKE 1 TABLET (30 MG TOTAL) BY MOUTH DAILY. 90 tablet 0   metoprolol tartrate (LOPRESSOR) 25 MG tablet TAKE ONE TABLET BY MOUTH TWICE A DAY 180 tablet 0   olmesartan (BENICAR) 20 MG tablet Take 1 tablet (20 mg total) by mouth daily. 90 tablet 0   rosuvastatin (CRESTOR) 40 MG tablet Take 1 tablet (40 mg total) by mouth every morning. 30 tablet 6    sitaGLIPtin (JANUVIA) 25 MG tablet Take 1 tablet (25 mg total) by mouth daily. 90 tablet 3   nitroGLYCERIN (NITROSTAT) 0.4 MG SL tablet PLACE ONE TABLET UNDER THE TONGUE EVERY 5 MINUTES TIMES THREE DOSES AS NEEDED FOR CHEST PAIN. CALL 911 IF 2ND DOSE DOES NOT HELP (Patient not taking: Reported on 05/18/2022) 25 tablet 5   No current facility-administered medications for this visit.    Allergies:   Xarelto [rivaroxaban]    ROS:  Please see the history of present illness.   Otherwise, review of systems are negative for all none.   All other systems are reviewed and negative.    PHYSICAL EXAM: VS:  BP 114/78 (BP Location: Left Arm, Patient Position: Sitting, Cuff Size: Large)   Pulse 60   Ht '5\' 11"'$  (1.803 m)   Wt 224 lb (101.6 kg)   SpO2 97%   BMI 31.24  kg/m  , BMI Body mass index is 31.24 kg/m.  GENERAL:  Well appearing NECK:  Positive jugular venous distention 6 cm at 45 degrees, waveform within normal limits, carotid upstroke brisk and symmetric, no bruits, no thyromegaly LUNGS:  Clear to auscultation bilaterally CHEST:  Unremarkable HEART:  PMI not displaced or sustained,S1 and S2 within normal limits, no S3, no clicks, no rubs, no murmurs, irregular  ABD:  Flat, positive bowel sounds normal in frequency in pitch, no bruits, no rebound, no guarding, no midline pulsatile mass, no hepatomegaly, no splenomegaly EXT:  2 plus pulses throughout, moderate bilateral lower extremity edema, no cyanosis no clubbing  EKG:  EKG is  ordered today. Atrial fibrillation, rate 60, axis within normal limits.  No changes.    Recent Labs: 11/02/2021: Hemoglobin 15.5; Platelets 149 05/02/2022: BUN 35; Creatinine, Ser 2.34; Potassium 5.3; Sodium 140; TSH 4.75    Lipid Panel    Component Value Date/Time   CHOL 100 05/02/2022 1105   CHOL 149 06/25/2019 1621   TRIG 51.0 05/02/2022 1105   HDL 35.80 (L) 05/02/2022 1105   HDL 43 06/25/2019 1621   CHOLHDL 3 05/02/2022 1105   VLDL 10.2 05/02/2022 1105    LDLCALC 54 05/02/2022 1105   LDLCALC 88 06/25/2019 1621      Wt Readings from Last 3 Encounters:  05/18/22 224 lb (101.6 kg)  05/02/22 221 lb 6.4 oz (100.4 kg)  02/09/22 220 lb (99.8 kg)      Other studies Reviewed: Additional studies/ records that were reviewed today include: Labs Review of the above records demonstrates:   See elsewhere   ASSESSMENT AND PLAN:  CHRONIC ATRIAL FIB:  Adam Harris has a CHA2DS2 - VASc score of 3.   He tolerates anticoagulation.  No change in therapy.  He is up-to-date with blood work.  CAD:   The patient has no new sypmtoms.  No further cardiovascular testing is indicated.  We will continue with aggressive risk reduction and meds as listed.    HTN:  The blood pressure is at target.  No change in therapy.   CHRONIC DIASTOLIC HF: I am going to get an echocardiogram.  I am going to increase his Lasix to 40 mg daily for the next 3 days.  He is going to keep his feet elevated.   DYSLIPIDEMIA:    LDL was 54 with an HDL 35.  No change in therapy.   DM: His A1c 7.1.   CKD IV: I will be checking a basic metabolic profile next week as above.  He has close follow-up with his kidney doctors.  Current medicines are reviewed at length with the patient today.  The patient does not have concerns regarding medicines.  The following changes have been made:  As above  Labs/ tests ordered today include:    Orders Placed This Encounter  Procedures   Basic metabolic panel   EKG [id number]   ECHOCARDIOGRAM COMPLETE     Disposition:   FU with APP 3 months.   Signed, Minus Breeding, MD  05/18/2022 5:10 PM    [facility]

## 2022-05-18 ENCOUNTER — Encounter: Payer: Self-pay | Admitting: Cardiology

## 2022-05-18 ENCOUNTER — Ambulatory Visit (INDEPENDENT_AMBULATORY_CARE_PROVIDER_SITE_OTHER): Payer: Medicare PPO | Admitting: Cardiology

## 2022-05-18 VITALS — BP 114/78 | HR 60 | Ht 71.0 in | Wt 224.0 lb

## 2022-05-18 DIAGNOSIS — I2510 Atherosclerotic heart disease of native coronary artery without angina pectoris: Secondary | ICD-10-CM

## 2022-05-18 DIAGNOSIS — I482 Chronic atrial fibrillation, unspecified: Secondary | ICD-10-CM

## 2022-05-18 DIAGNOSIS — I5032 Chronic diastolic (congestive) heart failure: Secondary | ICD-10-CM

## 2022-05-18 DIAGNOSIS — I10 Essential (primary) hypertension: Secondary | ICD-10-CM | POA: Diagnosis not present

## 2022-05-18 DIAGNOSIS — E785 Hyperlipidemia, unspecified: Secondary | ICD-10-CM | POA: Diagnosis not present

## 2022-05-18 DIAGNOSIS — E118 Type 2 diabetes mellitus with unspecified complications: Secondary | ICD-10-CM | POA: Diagnosis not present

## 2022-05-18 NOTE — Patient Instructions (Addendum)
Medication Instructions:  FUROSEMIDE: Take 40 mg for the next three days and then resume 20 mg once daily  *If you need a refill on your cardiac medications before your next appointment, please call your pharmacy*   Lab Work: Your provider would like for you to return in one week to have the following labs drawn: BMET. You do not need an appointment for the lab. Once in our office lobby there is a podium where you can sign in and ring the doorbell to alert [place] that you are here. The lab is open from 8:00 am to 4 pm; closed for lunch from 12:45pm-1:45pm.  You may also go to any of these [facility] locations:   [place] ([place]) - [facility] [address] [place]    [place] - [address] - [phone] [employer] [address]   If you have labs (blood work) drawn today and your tests are completely normal, you will receive your results only by: [employer] (if you have MyChart) OR A paper copy in the mail If you have any lab test that is abnormal or we need to change your treatment, we will call you to review the results.   Testing/Procedures: Your physician has requested that you have an echocardiogram. Echocardiography is a painless test that uses sound waves to create images of your heart. It provides your doctor with information about the size and shape of your heart and how well your heart's chambers and valves are working. You may receive an ultrasound enhancing agent through an IV if needed to better visualize your heart during the echo.This procedure takes approximately one hour. There are no restrictions for this procedure. This will take place at the [address], [address].    Follow-Up: At [facility], you and your health needs are our priority.  As part of our continuing mission to provide  you with exceptional heart care, we have created designated Provider Care Teams.  These Care Teams include your primary Cardiologist (physician) and Advanced Practice Providers (APPs -  Physician Assistants and Nurse Practitioners) who all work together to provide you with the care you need, when you need it.  We recommend signing up for the patient portal called "MyChart".  Sign up information is provided on this After Visit Summary.  MyChart is used to connect with patients for Virtual Visits (Telemedicine).  Patients are able to view lab/test results, encounter notes, upcoming appointments, etc.  Non-urgent messages can be sent to your provider as well.   To learn more about what you can do with MyChart, go to [email or web address].    Your next appointment:   3 months with an APP  Other Instructions Dr. Percival Spanish recommends [place]            -- [facility]             -- [address]             -- [place]             -- [address] [place]             -- [place]

## 2022-05-25 ENCOUNTER — Telehealth: Payer: Self-pay | Admitting: Family Medicine

## 2022-05-25 DIAGNOSIS — N184 Chronic kidney disease, stage 4 (severe): Secondary | ICD-10-CM | POA: Diagnosis not present

## 2022-05-25 DIAGNOSIS — I482 Chronic atrial fibrillation, unspecified: Secondary | ICD-10-CM | POA: Diagnosis not present

## 2022-05-25 NOTE — Telephone Encounter (Signed)
Left message for patient to call back and schedule Medicare Annual Wellness Visit (AWV).   Please offer to do virtually or by telephone.  Left office number and my jabber [phone].  Last AWV:05/31/2021  Please schedule at anytime with Nurse Health Advisor.

## 2022-05-26 LAB — BASIC METABOLIC PANEL
BUN/Creatinine Ratio: 17 (ref 10–24)
BUN: 37 mg/dL — ABNORMAL HIGH (ref 8–27)
CO2: 21 mmol/L (ref 20–29)
Calcium: 9.1 mg/dL (ref 8.6–10.2)
Chloride: 107 mmol/L — ABNORMAL HIGH (ref 96–106)
Creatinine, Ser: 2.13 mg/dL — ABNORMAL HIGH (ref 0.76–1.27)
Glucose: 111 mg/dL — ABNORMAL HIGH (ref 70–99)
Potassium: 4.6 mmol/L (ref 3.5–5.2)
Sodium: 141 mmol/L (ref 134–144)
eGFR: 32 mL/min/{1.73_m2} — ABNORMAL LOW (ref >59)

## 2022-05-31 DIAGNOSIS — R809 Proteinuria, unspecified: Secondary | ICD-10-CM | POA: Diagnosis not present

## 2022-05-31 DIAGNOSIS — I129 Hypertensive chronic kidney disease with stage 1 through stage 4 chronic kidney disease, or unspecified chronic kidney disease: Secondary | ICD-10-CM | POA: Diagnosis not present

## 2022-05-31 DIAGNOSIS — E1122 Type 2 diabetes mellitus with diabetic chronic kidney disease: Secondary | ICD-10-CM | POA: Diagnosis not present

## 2022-05-31 DIAGNOSIS — Z905 Acquired absence of kidney: Secondary | ICD-10-CM | POA: Diagnosis not present

## 2022-05-31 DIAGNOSIS — I2510 Atherosclerotic heart disease of native coronary artery without angina pectoris: Secondary | ICD-10-CM | POA: Diagnosis not present

## 2022-05-31 DIAGNOSIS — N400 Enlarged prostate without lower urinary tract symptoms: Secondary | ICD-10-CM | POA: Diagnosis not present

## 2022-05-31 DIAGNOSIS — N1832 Chronic kidney disease, stage 3b: Secondary | ICD-10-CM | POA: Diagnosis not present

## 2022-06-01 ENCOUNTER — Encounter (HOSPITAL_COMMUNITY): Payer: Self-pay | Admitting: Cardiology

## 2022-06-01 ENCOUNTER — Ambulatory Visit (HOSPITAL_COMMUNITY): Payer: Medicare PPO | Attending: Internal Medicine

## 2022-06-07 ENCOUNTER — Encounter: Payer: Self-pay | Admitting: Family Medicine

## 2022-06-12 ENCOUNTER — Ambulatory Visit (HOSPITAL_COMMUNITY): Payer: Medicare PPO | Attending: Cardiology

## 2022-06-12 DIAGNOSIS — I482 Chronic atrial fibrillation, unspecified: Secondary | ICD-10-CM | POA: Diagnosis not present

## 2022-06-12 LAB — ECHOCARDIOGRAM COMPLETE: S' Lateral: 1.7 cm

## 2022-06-13 ENCOUNTER — Other Ambulatory Visit: Payer: Self-pay

## 2022-06-13 ENCOUNTER — Ambulatory Visit (INDEPENDENT_AMBULATORY_CARE_PROVIDER_SITE_OTHER): Payer: Medicare PPO

## 2022-06-13 DIAGNOSIS — I10 Essential (primary) hypertension: Secondary | ICD-10-CM

## 2022-06-13 DIAGNOSIS — Z0000 Encounter for general adult medical examination without abnormal findings: Secondary | ICD-10-CM | POA: Diagnosis not present

## 2022-06-13 DIAGNOSIS — C678 Malignant neoplasm of overlapping sites of bladder: Secondary | ICD-10-CM | POA: Diagnosis not present

## 2022-06-13 MED ORDER — ISOSORBIDE MONONITRATE ER 30 MG PO TB24: 30.0000 mg | ORAL_TABLET | Freq: Every day | ORAL | 1 refills | Status: DC

## 2022-06-13 NOTE — Progress Notes (Signed)
Subjective:   Adam Harris is a 76 y.o. male who presents for an Subsequent Medicare Annual Wellness Visit.   I connected with Adam Harris  today by telephone and verified that I am speaking with the correct person using two identifiers. Location patient: home Location provider: work Persons participating in the virtual visit: patient, provider.   I discussed the limitations, risks, security and privacy concerns of performing an evaluation and management service by telephone and the availability of in person appointments. I also discussed with the patient that there may be a patient responsible charge related to this service. The patient expressed understanding and verbally consented to this telephonic visit.    Interactive audio and video telecommunications were attempted between this provider and patient, however failed, due to patient having technical difficulties OR patient did not have access to video capability.  We continued and completed visit with audio only.    Review of Systems           Objective:    Today's Vitals   There is no height or weight on file to calculate BMI.     06/13/2022    9:30 AM 02/09/2022   12:30 PM 11/10/2021   11:51 AM 11/02/2021    8:14 AM 05/31/2021   10:34 AM 02/03/2021    8:31 AM 08/12/2020    6:20 AM  Advanced Directives  Does Patient Have a Medical Advance Directive? No No No No No No No  Would patient like information on creating a medical advance directive? No - Patient declined  No - Patient declined No - Patient declined No - Patient declined  No - Patient declined    Current Medications (verified) Outpatient Encounter Medications as of 06/13/2022  Medication Sig   amLODipine (NORVASC) 5 MG tablet Take 1 tablet (5 mg total) by mouth daily.   ELIQUIS 5 MG TABS tablet TAKE 1 TABLET (5 MG TOTAL) BY MOUTH TWO (TWO) TIMES DAILY.   furosemide (LASIX) 20 MG tablet TAKE ONE TABLET BY MOUTH DAILY . MAY TAKE EXTRA TABLET IF NEEDED FOR SWELLING  (Patient taking differently: Take 20 mg by mouth daily as needed for edema.)   isosorbide mononitrate (IMDUR) 30 MG 24 hr tablet TAKE 1 TABLET (30 MG TOTAL) BY MOUTH DAILY.   metoprolol tartrate (LOPRESSOR) 25 MG tablet TAKE ONE TABLET BY MOUTH TWICE A DAY   nitroGLYCERIN (NITROSTAT) 0.4 MG SL tablet PLACE ONE TABLET UNDER THE TONGUE EVERY 5 MINUTES TIMES THREE DOSES AS NEEDED FOR CHEST PAIN. CALL 911 IF 2ND DOSE DOES NOT HELP   olmesartan (BENICAR) 20 MG tablet Take 1 tablet (20 mg total) by mouth daily.   rosuvastatin (CRESTOR) 40 MG tablet Take 1 tablet (40 mg total) by mouth every morning.   sitaGLIPtin (JANUVIA) 25 MG tablet Take 1 tablet (25 mg total) by mouth daily.   No facility-administered encounter medications on file as of 06/13/2022.    Allergies (verified) Xarelto [rivaroxaban]   History: Past Medical History:  Diagnosis Date   Abnormal radiologic findings on diagnostic imaging of renal pelvis, ureter, or bladder    bilateral ureter abnormalities   Anticoagulant long-term use    eliquis   Anxiety    Arthritis    CAD (coronary artery disease) cardiologist-- dr hochrein   NSTEMI 02-04-2014  per cardiac cath chronic occluded RCA w/ faint left-to-right collaterals and aneurysmal LCFx with sluggish coronary flow/   NSTEMI --11-21-2016 per cardiac cath occluded proximal RCA & mid to diastal CFX 100%, med rx.  If that does not work, PTCA or CABG   Cancer [facility])    bladder and kidney   CKD (chronic kidney disease), stage III ([place])    patient unaware   DOE (dyspnea on exertion)    2-3 flights of stairs   Fatty liver    pt denies   Hematuria 02/2019   History of COVID-19 10/2019   History of non-ST elevation myocardial infarction (NSTEMI)    02-04-2014  and 11-21-2016  cardiac cath done both times ,  medically management   History of shingles 12/2017   slight pain and numbness still noted in the area   Hyperlipidemia    Hypertension    Insomnia    Myocardial infarction [facility])  2015   Persistent atrial fibrillation [facility])    cardiologsit-- dr hochrein   Thoracic aortic atherosclerosis ([place])    Type 2 diabetes mellitus ([place])    Urinary frequency    Past Surgical History:  Procedure Laterality Date   CARDIAC CATHETERIZATION N/A 11/21/2016   Procedure: Left Heart Cath and Coronary Angiography;  Surgeon: Troy Sine, MD;  Location: [place] CV LAB;  Service: Cardiovascular;  Laterality: N/A;  pRCA 100% , ostial LAD 45%, OM3 80%, mCFX to dCFX 100% (AV groove), lateral OM3 50%   COLONOSCOPY     CYSTOSCOPY WITH BIOPSY N/A 08/12/2020   Procedure: CYSTOSCOPY WITH BLADDER BIOPSY AND TRANSURETHRAL RESECTION OF BLADDER TUMOR;  Surgeon: [name], MD;  Location: [facility];  Service: Urology;  Laterality: N/A;   CYSTOSCOPY WITH BIOPSY N/A 11/10/2021   Procedure: CYSTOSCOPY WITH BLADDER BIOPSIES/ LEFT RETROGRADE;  Surgeon: [name], MD;  Location: [facility];  Service: Urology;  Laterality: N/A;   CYSTOSCOPY WITH URETEROSCOPY AND STENT PLACEMENT Right 12/15/2019   Procedure: CYSTOSCOPY WITH RIGHT RETROGRADE/ RIGHT URETEROSCOPY/ BIOPSY;  Surgeon: [name], MD;  Location: [place];  Service: Urology;  Laterality: Right;   CYSTOSCOPY/RETROGRADE/URETEROSCOPY Bilateral 03/18/2018   Procedure: CYSTOSCOPY/RETROGRADE/URETEROSCOPY.;  Surgeon: [name], MD;  Location[facility];  Service: Urology;  Laterality: Bilateral;   EYE SURGERY Bilateral    cateract in January 2023   KNEE ARTHROSCOPY Right    LEFT HEART CATHETERIZATION WITH CORONARY ANGIOGRAM N/A 02/04/2014   Procedure: LEFT HEART CATHETERIZATION WITH CORONARY ANGIOGRAM;  Surgeon: [name], MD;  Location: [place] CATH LAB;  Service: Cardiovascular;  Laterality: N/A;  severe one-vessel CAD, chronically occluded RCA with faint left-to-right collaterals;  aneurysmal LCFx with sluggish coronary flow;  normal LVSF w/ moderately elevated LVEDP (ostialOM2 20%, pOM3 20%, pD1 20%, mCFX  50%, diffuse 20% pCFX)   PROSTATE BIOPSY N/A 08/12/2020   Procedure: BIOPSY TRANSRECTAL ULTRASONIC PROSTATE (TUBP);  Surgeon: [name], MD;  [facility];  Service: Urology;  Laterality: N/A;   ROBOT ASSITED LAPAROSCOPIC NEPHROURETERECTOMY Right 03/22/2020   Procedure: XI ROBOT ASSITED LAPAROSCOPIC NEPHROURETERECTOMY;  Surgeon: [name], MD;  [facility];  Service: Urology;  Laterality: Right;   TRANSTHORACIC ECHOCARDIOGRAM  11-22-2016   dr hochrein   ef 55-60%/ mild MR and TR/ moderate LAE   TRANSURETHRAL RESECTION OF BLADDER TUMOR N/A 02/10/2021   Procedure: TRANSURETHRAL RESECTION OF BLADDER TUMOR (TURBT)/ CYSTOSCOPY/ LEFT RETROGRADE;  Surgeon: [name], MD;  [facility];  Service: Urology;  Laterality: N/A;  GENERAL ANESTHESIA WITH PARALYSIS   Family History  Problem Relation Age of Onset   Hypertension Mother    Cancer Mother        anal cancer   Esophageal cancer Neg Hx  Pancreatic cancer Neg Hx    Stomach cancer Neg Hx    Liver disease Neg Hx    Social History   Socioeconomic History   Marital status: Married    Spouse name: Manuela Schwartz   Number of children: 3   Years of education: Not on file   Highest education level: Master's degree (e.g., MA, MS, MEng, MEd, MSW, MBA)  Occupational History   Occupation: Retired- Leisure centre manager    Comment: [place]  Tobacco Use   Smoking status: Former    Packs/day: 0.25    Years: 4.00    Total pack years: 1.00    Types: Cigarettes    Quit date: 1972    Years since quitting: 51.6   Smokeless tobacco: Former    Types: Chew    Quit date: 1972  Vaping Use   Vaping Use: Never used  Substance and Sexual Activity   Alcohol use: No   Drug use: No   Sexual activity: Yes  Other Topics Concern   Not on file  Social History Narrative   Not on file   Social Determinants of Health   Financial Resource Strain: Low Risk  (06/13/2022)   Overall Financial Resource Strain (CARDIA)    Difficulty of Paying  Living Expenses: Not hard at all  Food Insecurity: No Food Insecurity (06/13/2022)   Hunger Vital Sign    Worried About Running Out of Food in the Last Year: Never true    [place] in the Last Year: Never true  Transportation Needs: No Transportation Needs (06/13/2022)   PRAPARE - Hydrologist (Medical): No    Lack of Transportation (Non-Medical): No  Physical Activity: Insufficiently Active (06/13/2022)   Exercise Vital Sign    Days of Exercise per Week: 3 days    Minutes of Exercise per Session: 30 min  Stress: No Stress Concern Present (06/13/2022)   [place]    Feeling of Stress : Not at all  Social Connections: Moderately Integrated (06/13/2022)   Social Connection and Isolation Panel [NHANES]    Frequency of Communication with Friends and Family: Three times a week    Frequency of Social Gatherings with Friends and Family: Three times a week    Attends Religious Services: 1 to 4 times per year    Active Member of Clubs or Organizations: No    Attends Archivist Meetings: Never    Marital Status: Married    Tobacco Counseling Counseling given: Not Answered   Clinical Intake:  Pre-visit preparation completed: Yes  Pain : No/denies pain     Nutritional Risks: None Diabetes: Yes CBG done?: No Did pt. bring in CBG monitor from home?: No  How often do you need to have someone help you when you read instructions, pamphlets, or other written materials from your doctor or pharmacy?: 1 - Never What is the last grade level you completed in school?: Masters  Diabetic?yes Nutrition Risk Assessment:  Has the patient had any N/V/D within the last 2 months?  No  Does the patient have any non-healing wounds?  No  Has the patient had any unintentional weight loss or weight gain?  No   Diabetes:  Is the patient diabetic?  Yes  If diabetic, was a CBG obtained today?  No   Did the patient bring in their glucometer from home?  Yes  How often do you monitor your CBG's? Daily .   Financial  Strains and Diabetes Management:  Are you having any financial strains with the device, your supplies or your medication? No .  Does the patient want to be seen by Chronic Care Management for management of their diabetes?  No  Would the patient like to be referred to a Nutritionist or for Diabetic Management?  No   Diabetic Exams:  Diabetic Eye Exam: Completed 12/2021 Diabetic Foot Exam: Overdue, Pt has been advised about the importance in completing this exam. Pt is scheduled for diabetic foot exam on next office visit .   Interpreter Needed?: No  Information entered by :: L.Wilson,LPN   Activities of Daily Living    11/02/2021    8:15 AM 11/02/2021    8:13 AM  In your present state of health, do you have any difficulty performing the following activities:  Hearing?  0  Vision?  0  Walking or climbing stairs?  0  Dressing or bathing?  0  Doing errands, shopping? 0     Patient Care Team: Haydee Salter, MD as PCP - General (Family Medicine) Germaine Pomfret, [facility] as Pharmacist (Pharmacist) [name], MD as Consulting Physician (Urology) Minus Breeding, MD as Consulting Physician (Cardiology) Claudia Desanctis, MD as Consulting Physician (Nephrology)  Indicate any recent Medical Services you may have received from other than [facility] providers in the past year (date may be approximate).     Assessment:   This is a routine wellness examination for [place].  Hearing/Vision screen Vision Screening - Comments:: Annual eye exams   Dietary issues and exercise activities discussed:     Goals Addressed   None    Depression Screen    06/13/2022    9:31 AM 06/13/2022    9:29 AM 05/31/2021   10:43 AM 06/01/2020   10:20 AM 11/27/2019    3:08 PM 08/23/2018    3:02 PM 05/22/2016    3:35 PM  PHQ 2/9 Scores  PHQ - 2 Score 0 0 0 0 0 0 0  PHQ- 9 Score    4        Fall Risk    06/13/2022    9:31 AM 05/31/2021   10:44 AM 12/17/2020   10:54 AM 08/23/2018    3:02 PM 05/22/2016    3:35 PM  Fall Risk   Falls in the past year? 0 0 0 Yes No  Number falls in past yr: 0 0 0 1   Injury with Fall? 0 0 0    Follow up Falls evaluation completed;Education provided Falls evaluation completed;Falls prevention discussed       FALL RISK PREVENTION PERTAINING TO THE HOME:  Any stairs in or around the home? Yes  If so, are there any without handrails? No  Home free of loose throw rugs in walkways, pet beds, electrical cords, etc? Yes  Adequate lighting in your home to reduce risk of falls? Yes   ASSISTIVE DEVICES UTILIZED TO PREVENT FALLS:  Life alert? No  Use of a cane, walker or w/c? No  Grab bars in the bathroom? No  Shower chair or bench in shower? Yes  Elevated toilet seat or a handicapped toilet? No     Cognitive Function:    Normal cognitive status assessed by telephone conversation  by this Nurse Health Advisor. No abnormalities found.      06/13/2022    9:34 AM  6CIT Screen  What Year? 0 points  What month? 0 points  What time? 0 points  Count back from 20  0 points  Months in reverse 0 points  Repeat phrase 0 points  Total Score 0 points    Immunizations Immunization History  Administered Date(s) Administered   DTaP 12/24/2014   Fluad Quad(high Dose 65+) 09/04/2019, 12/17/2020, 09/27/2021   Influenza, High Dose Seasonal PF 08/23/2018   Influenza,inj,Quad PF,6+ Mos 07/27/2014   Influenza-Unspecified 07/27/2014   Pneumococcal Conjugate-13 05/22/2016   Pneumococcal Polysaccharide-23 02/03/2014, 12/24/2014   Tdap 02/03/2014, 12/24/2014, 07/07/2018   Typhoid Live 01/27/1966   Vaccinia,smallpox Monkeypox Vaccine Live,pf 01/27/1966    TDAP status: Up to date  Flu Vaccine status: Up to date  Pneumococcal vaccine status: Up to date  Covid-19 vaccine status: Completed vaccines  Qualifies for Shingles Vaccine? Yes   Zostavax  completed No   Shingrix Completed?: No.    Education has been provided regarding the importance of this vaccine. Patient has been advised to call insurance company to determine out of pocket expense if they have not yet received this vaccine. Advised may also receive vaccine at local pharmacy or Health Dept. Verbalized acceptance and understanding.  Screening Tests Health Maintenance  Topic Date Due   COVID-19 Vaccine (1) Never done   Zoster Vaccines- Shingrix (1 of 2) Never done   OPHTHALMOLOGY EXAM  05/18/2022   INFLUENZA VACCINE  06/13/2022   HEMOGLOBIN A1C  11/01/2022   FOOT EXAM  12/13/2022   TETANUS/TDAP  07/07/2028   COLONOSCOPY (Pts 45-7yr Insurance coverage will need to be confirmed)  04/23/2031   Pneumonia Vaccine 75 Years old  Completed   Hepatitis C Screening  Completed   HPV VACCINES  Aged Out    Health Maintenance  Health Maintenance Due  Topic Date Due   COVID-19 Vaccine (1) Never done   Zoster Vaccines- Shingrix (1 of 2) Never done   OPHTHALMOLOGY EXAM  05/18/2022   INFLUENZA VACCINE  06/13/2022    Colorectal cancer screening: No longer required.   Lung Cancer Screening: (Low Dose CT Chest recommended if Age 75-80years, 30 pack-year currently smoking OR have quit w/in 15years.) does not qualify.   Lung Cancer Screening Referral: n/a  Additional Screening:  Hepatitis C Screening: does not qualify;   Vision Screening: Recommended annual ophthalmology exams for early detection of glaucoma and other disorders of the eye. Is the patient up to date with their annual eye exam?  Yes  Who is the provider or what is the name of the office in which the patient attends annual eye exams? Dr.Groat  If pt is not established with a provider, would they like to be referred to a provider to establish care? No .   Dental Screening: Recommended annual dental exams for proper oral hygiene  Community Resource Referral / Chronic Care Management: CRR required this visit?   No   CCM required this visit?  No      Plan:     I have personally reviewed and noted the following in the patient's chart:   Medical and social history Use of alcohol, tobacco or illicit drugs  Current medications and supplements including opioid prescriptions. Patient is not currently taking opioid prescriptions. Functional ability and status Nutritional status Physical activity Advanced directives List of other physicians Hospitalizations, surgeries, and ER visits in previous 12 months Vitals Screenings to include cognitive, depression, and falls Referrals and appointments  In addition, I have reviewed and discussed with patient certain preventive protocols, quality metrics, and best practice recommendations. A written personalized care plan for preventive services as well as general preventive health recommendations were  provided to patient.     Daphane Shepherd, LPN   11/20/3670   Nurse Notes: none

## 2022-06-13 NOTE — Patient Instructions (Signed)
Adam Harris , Thank you for taking time to come for your Medicare Wellness Visit. I appreciate your ongoing commitment to your health goals. Please review the following plan we discussed and let me know if I can assist you in the future.   Screening recommendations/referrals: Colonoscopy: no longer required  Recommended yearly ophthalmology/optometry visit for glaucoma screening and checkup Recommended yearly dental visit for hygiene and checkup  Vaccinations: Influenza vaccine: completed  Pneumococcal vaccine: completed  Tdap vaccine: 08/225/2019 Shingles vaccine: will consider     Advanced directives: none   Conditions/risks identified: none   Next appointment: none   Preventive Care 75 Years and Older, Male Preventive care refers to lifestyle choices and visits with your health care provider that can promote health and wellness. What does preventive care include? A yearly physical exam. This is also called an annual well check. Dental exams once or twice a year. Routine eye exams. Ask your health care provider how often you should have your eyes checked. Personal lifestyle choices, including: Daily care of your teeth and gums. Regular physical activity. Eating a healthy diet. Avoiding tobacco and drug use. Limiting alcohol use. Practicing safe sex. Taking low doses of aspirin every day. Taking vitamin and mineral supplements as recommended by your health care provider. What happens during an annual well check? The services and screenings done by your health care provider during your annual well check will depend on your age, overall health, lifestyle risk factors, and family history of disease. Counseling  Your health care provider may ask you questions about your: Alcohol use. Tobacco use. Drug use. Emotional well-being. Home and relationship well-being. Sexual activity. Eating habits. History of falls. Memory and ability to understand (cognition). Work and work  Statistician. Screening  You may have the following tests or measurements: Height, weight, and BMI. Blood pressure. Lipid and cholesterol levels. These may be checked every 5 years, or more frequently if you are over 26 years old. Skin check. Lung cancer screening. You may have this screening every year starting at age 50 if you have a 30-pack-year history of smoking and currently smoke or have quit within the past 15 years. Fecal occult blood test (FOBT) of the stool. You may have this test every year starting at age 23. Flexible sigmoidoscopy or colonoscopy. You may have a sigmoidoscopy every 5 years or a colonoscopy every 10 years starting at age 37. Prostate cancer screening. Recommendations will vary depending on your family history and other risks. Hepatitis C blood test. Hepatitis B blood test. Sexually transmitted disease (STD) testing. Diabetes screening. This is done by checking your blood sugar (glucose) after you have not eaten for a while (fasting). You may have this done every 1-3 years. Abdominal aortic aneurysm (AAA) screening. You may need this if you are a current or former smoker. Osteoporosis. You may be screened starting at age 31 if you are at high risk. Talk with your health care provider about your test results, treatment options, and if necessary, the need for more tests. Vaccines  Your health care provider may recommend certain vaccines, such as: Influenza vaccine. This is recommended every year. Tetanus, diphtheria, and acellular pertussis (Tdap, Td) vaccine. You may need a Td booster every 10 years. Zoster vaccine. You may need this after age 73. Pneumococcal 13-valent conjugate (PCV13) vaccine. One dose is recommended after age 80. Pneumococcal polysaccharide (PPSV23) vaccine. One dose is recommended after age 37. Talk to your health care provider about which screenings and vaccines you need and  how often you need them. This information is not intended to replace  advice given to you by your health care provider. Make sure you discuss any questions you have with your health care provider. Document Released: 11/26/2015 Document Revised: 07/19/2016 Document Reviewed: 08/31/2015 Elsevier Interactive Patient Education  2017 [place] Prevention in the Home Falls can cause injuries. They can happen to people of all ages. There are many things you can do to make your home safe and to help prevent falls. What can I do on the outside of my home? Regularly fix the edges of walkways and driveways and fix any cracks. Remove anything that might make you trip as you walk through a door, such as a raised step or threshold. Trim any bushes or trees on the path to your home. Use bright outdoor lighting. Clear any walking paths of anything that might make someone trip, such as rocks or tools. Regularly check to see if handrails are loose or broken. Make sure that both sides of any steps have handrails. Any raised decks and porches should have guardrails on the edges. Have any leaves, snow, or ice cleared regularly. Use sand or salt on walking paths during winter. Clean up any spills in your garage right away. This includes oil or grease spills. What can I do in the bathroom? Use night lights. Install grab bars by the toilet and in the tub and shower. Do not use towel bars as grab bars. Use non-skid mats or decals in the tub or shower. If you need to sit down in the shower, use a plastic, non-slip stool. Keep the floor dry. Clean up any water that spills on the floor as soon as it happens. Remove soap buildup in the tub or shower regularly. Attach bath mats securely with double-sided non-slip rug tape. Do not have throw rugs and other things on the floor that can make you trip. What can I do in the bedroom? Use night lights. Make sure that you have a light by your bed that is easy to reach. Do not use any sheets or blankets that are too big for your bed.  They should not hang down onto the floor. Have a firm chair that has side arms. You can use this for support while you get dressed. Do not have throw rugs and other things on the floor that can make you trip. What can I do in the kitchen? Clean up any spills right away. Avoid walking on wet floors. Keep items that you use a lot in easy-to-reach places. If you need to reach something above you, use a strong step stool that has a grab bar. Keep electrical cords out of the way. Do not use floor polish or wax that makes floors slippery. If you must use wax, use non-skid floor wax. Do not have throw rugs and other things on the floor that can make you trip. What can I do with my stairs? Do not leave any items on the stairs. Make sure that there are handrails on both sides of the stairs and use them. Fix handrails that are broken or loose. Make sure that handrails are as long as the stairways. Check any carpeting to make sure that it is firmly attached to the stairs. Fix any carpet that is loose or worn. Avoid having throw rugs at the top or bottom of the stairs. If you do have throw rugs, attach them to the floor with carpet tape. Make sure that you have  a light switch at the top of the stairs and the bottom of the stairs. If you do not have them, ask someone to add them for you. What else can I do to help prevent falls? Wear shoes that: Do not have high heels. Have rubber bottoms. Are comfortable and fit you well. Are closed at the toe. Do not wear sandals. If you use a stepladder: Make sure that it is fully opened. Do not climb a closed stepladder. Make sure that both sides of the stepladder are locked into place. Ask someone to hold it for you, if possible. Clearly mark and make sure that you can see: Any grab bars or handrails. First and last steps. Where the edge of each step is. Use tools that help you move around (mobility aids) if they are needed. These  include: Canes. Walkers. Scooters. Crutches. Turn on the lights when you go into a dark area. Replace any light bulbs as soon as they burn out. Set up your furniture so you have a clear path. Avoid moving your furniture around. If any of your floors are uneven, fix them. If there are any pets around you, be aware of where they are. Review your medicines with your doctor. Some medicines can make you feel dizzy. This can increase your chance of falling. Ask your doctor what other things that you can do to help prevent falls. This information is not intended to replace advice given to you by your health care provider. Make sure you discuss any questions you have with your health care provider. Document Released: 08/26/2009 Document Revised: 04/06/2016 Document Reviewed: 12/04/2014 Elsevier Interactive Patient Education  2017 [employer].

## 2022-06-16 ENCOUNTER — Other Ambulatory Visit: Payer: Self-pay | Admitting: Urology

## 2022-06-16 DIAGNOSIS — C678 Malignant neoplasm of overlapping sites of bladder: Secondary | ICD-10-CM

## 2022-06-16 DIAGNOSIS — C61 Malignant neoplasm of prostate: Secondary | ICD-10-CM

## 2022-06-16 DIAGNOSIS — R9341 Abnormal radiologic findings on diagnostic imaging of renal pelvis, ureter, or bladder: Secondary | ICD-10-CM

## 2022-06-18 ENCOUNTER — Ambulatory Visit (HOSPITAL_COMMUNITY)
Admission: RE | Admit: 2022-06-18 | Discharge: 2022-06-18 | Disposition: A | Discharge status: Home / Self Care | Payer: Medicare PPO | Source: Ambulatory Visit | Attending: Urology | Admitting: Urology

## 2022-06-18 DIAGNOSIS — C678 Malignant neoplasm of overlapping sites of bladder: Secondary | ICD-10-CM

## 2022-06-18 DIAGNOSIS — C61 Malignant neoplasm of prostate: Secondary | ICD-10-CM | POA: Diagnosis not present

## 2022-06-18 DIAGNOSIS — R9341 Abnormal radiologic findings on diagnostic imaging of renal pelvis, ureter, or bladder: Secondary | ICD-10-CM | POA: Diagnosis not present

## 2022-06-18 DIAGNOSIS — R590 Localized enlarged lymph nodes: Secondary | ICD-10-CM | POA: Diagnosis not present

## 2022-06-18 DIAGNOSIS — N281 Cyst of kidney, acquired: Secondary | ICD-10-CM | POA: Diagnosis not present

## 2022-06-18 DIAGNOSIS — C679 Malignant neoplasm of bladder, unspecified: Secondary | ICD-10-CM | POA: Diagnosis not present

## 2022-06-18 DIAGNOSIS — N3289 Other specified disorders of bladder: Secondary | ICD-10-CM | POA: Diagnosis not present

## 2022-06-18 MED ORDER — GADOBUTROL 1 MMOL/ML IV SOLN
10.0000 mL | Freq: Once | INTRAVENOUS | Status: AC | PRN
Start: 2022-06-18 — End: 2022-06-18
  Administered 2022-06-18: 10 mL via INTRAVENOUS

## 2022-06-19 ENCOUNTER — Telehealth: Payer: Self-pay | Admitting: Cardiology

## 2022-06-19 NOTE — Telephone Encounter (Signed)
Patient is returning call for his echo results. Please advise.

## 2022-06-19 NOTE — Telephone Encounter (Signed)
Explained to patient the echo results per Dr. Percival Spanish..."EF is well preserved.  No LV dysfunction.  Some septal hypertrophy which I will follow clinically.  No change in therapy." Patient had no questions or concerns at this time. Reviewed f/u appointment dates with him.

## 2022-06-20 DIAGNOSIS — C678 Malignant neoplasm of overlapping sites of bladder: Secondary | ICD-10-CM | POA: Diagnosis not present

## 2022-06-20 DIAGNOSIS — R3912 Poor urinary stream: Secondary | ICD-10-CM | POA: Diagnosis not present

## 2022-06-20 DIAGNOSIS — N430 Encysted hydrocele: Secondary | ICD-10-CM | POA: Diagnosis not present

## 2022-06-20 DIAGNOSIS — N401 Enlarged prostate with lower urinary tract symptoms: Secondary | ICD-10-CM | POA: Diagnosis not present

## 2022-06-20 DIAGNOSIS — C61 Malignant neoplasm of prostate: Secondary | ICD-10-CM | POA: Diagnosis not present

## 2022-06-21 ENCOUNTER — Encounter: Payer: Self-pay | Admitting: Family Medicine

## 2022-06-21 DIAGNOSIS — C61 Malignant neoplasm of prostate: Secondary | ICD-10-CM | POA: Insufficient documentation

## 2022-06-27 DIAGNOSIS — N430 Encysted hydrocele: Secondary | ICD-10-CM | POA: Diagnosis not present

## 2022-07-18 DIAGNOSIS — R300 Dysuria: Secondary | ICD-10-CM | POA: Diagnosis not present

## 2022-07-19 ENCOUNTER — Other Ambulatory Visit: Payer: Self-pay | Admitting: Urology

## 2022-07-19 ENCOUNTER — Telehealth: Payer: Self-pay | Admitting: Cardiology

## 2022-07-19 NOTE — Telephone Encounter (Signed)
   Pre-operative Risk Assessment    Patient Name: Adam Harris  DOB: November 15, 1945 MRN: [id number]      Request for Surgical Clearance    Procedure:   Left Hydrocele Repair Cystoscopy Bladder Biopsy versus Transurethral Resection of a bladder tumor   Date of Surgery:  Clearance 07/27/22                                 Surgeon:  Dr. [name]  Surgeon's Group or Practice Name:  [facility]  Phone number:  [phone] Fax number:  [phone]   Type of Clearance Requested:   - Medical  - Pharmacy:  Hold Apixaban (Eliquis) Needing to be held 3 days prior    Type of Anesthesia:  General    Additional requests/questions:    Signed, April Henson   07/19/2022, 2:56 PM

## 2022-07-20 ENCOUNTER — Telehealth: Payer: Self-pay | Admitting: *Deleted

## 2022-07-20 NOTE — Telephone Encounter (Signed)
   Name: Adam Harris  DOB: Jan 22, 1946  MRN: [id number]  Primary Cardiologist: None  Chart reviewed as part of pre-operative protocol coverage. Because of [name]'s past medical history and time since last visit, he will require a follow-up telephone visit in order to better assess preoperative cardiovascular risk.  Pre-op covering staff: - Please schedule appointment and call patient to inform them. If patient already had an upcoming appointment within acceptable timeframe, please add "pre-op clearance" to the appointment notes so provider is aware. - Please contact requesting surgeon's office via preferred method (i.e, phone, fax) to inform them of need for appointment prior to surgery.  Per office protocol, patient can hold Eliquis for 3 days prior to procedure.   Patient will not need bridging with Lovenox (enoxaparin) around procedure.  Elgie Collard, PA-C  07/20/2022, 10:25 AM

## 2022-07-20 NOTE — Telephone Encounter (Signed)
Pt agreeable to plan of care for tele pre op appt 07/21/22 @ 1:40 add on due to Eliquis hold time. Pt will need to begin holding Eliquis as on 07/24/22 for procedure 07/27/22.    Med rec and consent are done.

## 2022-07-20 NOTE — Telephone Encounter (Signed)
Pt agreeable to plan of care for tele pre op appt 07/21/22 @ 1:40 add on due to Eliquis hold time. Pt will need to begin holding Eliquis as on 07/24/22 for procedure 07/27/22.   Med rec and consent are done.     Patient Consent for Virtual Visit        [name] has provided verbal consent on 07/20/2022 for a virtual visit (video or telephone).   CONSENT FOR VIRTUAL VISIT FOR:  Adam Harris  By participating in this virtual visit I agree to the following:  I hereby voluntarily request, consent and authorize [facility] and its employed or contracted physicians, physician assistants, nurse practitioners or other licensed health care professionals (the Practitioner), to provide me with telemedicine health care services (the "Services") as deemed necessary by the treating Practitioner. I acknowledge and consent to receive the Services by the Practitioner via telemedicine. I understand that the telemedicine visit will involve communicating with the Practitioner through live audiovisual communication technology and the disclosure of certain medical information by electronic transmission. I acknowledge that I have been given the opportunity to request an in-person assessment or other available alternative prior to the telemedicine visit and am voluntarily participating in the telemedicine visit.  I understand that I have the right to withhold or withdraw my consent to the use of telemedicine in the course of my care at any time, without affecting my right to future care or treatment, and that the Practitioner or I may terminate the telemedicine visit at any time. I understand that I have the right to inspect all information obtained and/or recorded in the course of the telemedicine visit and may receive copies of available information for a reasonable fee.  I understand that some of the potential risks of receiving the Services via telemedicine include:  Delay or interruption in medical evaluation due  to technological equipment failure or disruption; Information transmitted may not be sufficient (e.g. poor resolution of images) to allow for appropriate medical decision making by the Practitioner; and/or  In rare instances, security protocols could fail, causing a breach of personal health information.  Furthermore, I acknowledge that it is my responsibility to provide information about my medical history, conditions and care that is complete and accurate to the best of my ability. I acknowledge that Practitioner's advice, recommendations, and/or decision may be based on factors not within their control, such as incomplete or inaccurate data provided by me or distortions of diagnostic images or specimens that may result from electronic transmissions. I understand that the practice of medicine is not an exact science and that Practitioner makes no warranties or guarantees regarding treatment outcomes. I acknowledge that a copy of this consent can be made available to me via my patient portal ([facility]), or I can request a printed copy by calling the office of [facility].    I understand that my insurance will be billed for this visit.   I have read or had this consent read to me. I understand the contents of this consent, which adequately explains the benefits and risks of the Services being provided via telemedicine.  I have been provided ample opportunity to ask questions regarding this consent and the Services and have had my questions answered to my satisfaction. I give my informed consent for the services to be provided through the use of telemedicine in my medical care

## 2022-07-20 NOTE — Telephone Encounter (Signed)
Patient with diagnosis of atrial fibrillation on Eliquis for anticoagulation.    Procedure: left hydrocele repair cystoscopy balder biopsy vs transurethral resection of bladder tumor Date of procedure: 07/27/22    CHA2DS2-VASc Score = 6   This indicates a 9.7% annual risk of stroke. The patient's score is based upon: CHF History: 1 HTN History: 1 Diabetes History: 1 Stroke History: 0 Vascular Disease History: 1 Age Score: 2 Gender Score: 0   CrCl 36 with adjusted body weight Platelet count 149  Per office protocol, patient can hold Eliquis for 3 days prior to procedure.   Patient will not need bridging with Lovenox (enoxaparin) around procedure.  **This guidance is not considered finalized until pre-operative APP has relayed final recommendations.**

## 2022-07-21 ENCOUNTER — Ambulatory Visit: Payer: Medicare PPO | Attending: Cardiovascular Disease | Admitting: Physician Assistant

## 2022-07-21 DIAGNOSIS — Z01810 Encounter for preprocedural cardiovascular examination: Secondary | ICD-10-CM

## 2022-07-21 NOTE — Progress Notes (Signed)
Virtual Visit via Telephone Note   Because of Adam Harris's co-morbid illnesses, he is at least at moderate risk for complications without adequate follow up.  This format is felt to be most appropriate for this patient at this time.  The patient did not have access to video technology/had technical difficulties with video requiring transitioning to audio format only (telephone).  All issues noted in this document were discussed and addressed.  No physical exam could be performed with this format.  Please refer to the patient's chart for his consent to telehealth for [facility].  Evaluation Performed:  Preoperative cardiovascular risk assessment _____________   Date:  07/21/2022   Patient ID:  Adam Harris, DOB 05-20-1946, MRN [id number] Patient Location:  Home Provider location:   Office  Primary Care Provider:  Haydee Salter, MD Primary Cardiologist:  None  Chief Complaint / Patient Profile   76 y.o. y/o male with a h/o atrial fibrillation on long-term anticoagulation and CAD who is pending left hydrocele repair, cystoscopy bladder biopsy versus transurethral resection of a bladder tumor and presents today for telephonic preoperative cardiovascular risk assessment.  PATIENT NOT AVAILABLE FOR APPOINTMENT TIME. WILL RESCHEDULE MONDAY AT 11AM  Past Medical History    Past Medical History:  Diagnosis Date   Abnormal radiologic findings on diagnostic imaging of renal pelvis, ureter, or bladder    bilateral ureter abnormalities   Anticoagulant long-term use    eliquis   Anxiety    Arthritis    CAD (coronary artery disease) cardiologist-- dr hochrein   NSTEMI 02-04-2014  per cardiac cath chronic occluded RCA w/ faint left-to-right collaterals and aneurysmal LCFx with sluggish coronary flow/   NSTEMI --11-21-2016 per cardiac cath occluded proximal RCA & mid to diastal CFX 100%, med rx. 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   CKD (chronic  kidney disease), stage III ([place])    patient unaware   DOE (dyspnea on exertion)    2-3 flights of stairs   Fatty liver    pt denies   Hematuria 02/2019   History of COVID-19 10/2019   History of non-ST elevation myocardial infarction (NSTEMI)    02-04-2014  and 11-21-2016  cardiac cath done both times ,  medically management   History of shingles 12/2017   slight pain and numbness still noted in the area   Hyperlipidemia    Hypertension    Insomnia    Myocardial infarction [facility]) 2015   Persistent atrial fibrillation [facility])    cardiologsit-- dr hochrein   Thoracic aortic atherosclerosis ([place])    Type 2 diabetes mellitus ([place])    Urinary frequency    Past Surgical History:  Procedure Laterality Date   CARDIAC CATHETERIZATION N/A 11/21/2016   Procedure: Left Heart Cath and Coronary Angiography;  Surgeon: Troy Sine, MD;  Location: [facility];  Service: Cardiovascular;  Laterality: N/A;  pRCA 100% , ostial LAD 45%, OM3 80%, mCFX to dCFX 100% (AV groove), lateral OM3 50%   COLONOSCOPY     CYSTOSCOPY WITH BIOPSY N/A 08/12/2020   Procedure: CYSTOSCOPY WITH BLADDER BIOPSY AND TRANSURETHRAL RESECTION OF BLADDER TUMOR;  Surgeon: [name], MD;  Location: [facility];  Service: Urology;  Laterality: N/A;   CYSTOSCOPY WITH BIOPSY N/A 11/10/2021   Procedure: CYSTOSCOPY WITH BLADDER BIOPSIES/ LEFT RETROGRADE;  Surgeon: [name], MD;  Location: [facility];  Service: Urology;  Laterality: N/A;   CYSTOSCOPY WITH URETEROSCOPY AND  STENT PLACEMENT Right 12/15/2019   Procedure: CYSTOSCOPY WITH RIGHT RETROGRADE/ RIGHT URETEROSCOPY/ BIOPSY;  Surgeon: [name], MD;  Location: [place];  Service: Urology;  Laterality: Right;   CYSTOSCOPY/RETROGRADE/URETEROSCOPY Bilateral 03/18/2018   Procedure: CYSTOSCOPY/RETROGRADE/URETEROSCOPY.;  Surgeon: [name], MD;  Location: [facility];  Service: Urology;  Laterality: Bilateral;   EYE SURGERY Bilateral     cateract in January 2023   KNEE ARTHROSCOPY Right    LEFT HEART CATHETERIZATION WITH CORONARY ANGIOGRAM N/A 02/04/2014   Procedure: LEFT HEART CATHETERIZATION WITH CORONARY ANGIOGRAM;  Surgeon: [name], MD;  Location: [facility];  Service: Cardiovascular;  Laterality: N/A;  severe one-vessel CAD, chronically occluded RCA with faint left-to-right collaterals;  aneurysmal LCFx with sluggish coronary flow;  normal LVSF w/ moderately elevated LVEDP (ostialOM2 20%, pOM3 20%, pD1 20%, mCFX 50%, diffuse 20% pCFX)   PROSTATE BIOPSY N/A 08/12/2020   Procedure: BIOPSY TRANSRECTAL ULTRASONIC PROSTATE (TUBP);  Surgeon: [name], MD;  Location: [facility];  Service: Urology;  Laterality: N/A;   ROBOT ASSITED LAPAROSCOPIC NEPHROURETERECTOMY Right 03/22/2020   Procedure: XI ROBOT ASSITED LAPAROSCOPIC NEPHROURETERECTOMY;  Surgeon: [name], MD;  Location: [facility];  Service: Urology;  Laterality: Right;   TRANSTHORACIC ECHOCARDIOGRAM  11-22-2016   dr hochrein   ef 55-60%/ mild MR and TR/ moderate LAE   TRANSURETHRAL RESECTION OF BLADDER TUMOR N/A 02/10/2021   Procedure: TRANSURETHRAL RESECTION OF BLADDER TUMOR (TURBT)/ CYSTOSCOPY/ LEFT RETROGRADE;  Surgeon: [name], MD;  Location: [facility];  Service: Urology;  Laterality: N/A;  GENERAL ANESTHESIA WITH PARALYSIS    Allergies  Allergies  Allergen Reactions   Xarelto [Rivaroxaban] Other (See Comments)    Skin Blisters     History of Present Illness    Adam Harris is a 76 y.o. male who presents via audio/video conferencing for a telehealth visit today.  Pt was last seen in cardiology clinic on 05/18/2022 by Dr. Percival Spanish.  At that time Adam Harris was doing well .  The patient is now pending procedure as outlined above. Since his last visit, he   Per office protocol, patient can hold Eliquis for 3 days prior to procedure.   Patient will not need bridging with Lovenox (enoxaparin) around procedure. Home Medications    Prior to Admission  medications   Medication Sig Start Date End Date Taking? Authorizing Provider  amLODipine (NORVASC) 5 MG tablet Take 1 tablet (5 mg total) by mouth daily. 12/23/21   Minus Breeding, MD  cefpodoxime (VANTIN) 200 MG tablet Take 200 mg by mouth 2 (two) times daily. 07/18/22   [provider]  diphenhydramine-acetaminophen (TYLENOL PM) 25-500 MG TABS tablet Take 1 tablet by mouth at bedtime as needed (pain/sleep).    [provider]  ELIQUIS 5 MG TABS tablet TAKE 1 TABLET (5 MG TOTAL) BY MOUTH TWO (TWO) TIMES DAILY. 01/13/22   Minus Breeding, MD  finasteride (PROSCAR) 5 MG tablet Take 5 mg by mouth daily. 06/20/22   [provider]  furosemide (LASIX) 20 MG tablet TAKE ONE TABLET BY MOUTH DAILY . MAY TAKE EXTRA TABLET IF NEEDED FOR SWELLING Patient not taking: Reported on 07/20/2022 08/19/21   Minus Breeding, MD  furosemide (LASIX) 40 MG tablet Take 40 mg by mouth daily as needed for fluid.    [provider]  isosorbide mononitrate (IMDUR) 30 MG 24 hr tablet Take 1 tablet (30 mg total) by mouth daily. 06/13/22   Haydee Salter, MD  metoprolol tartrate (LOPRESSOR) 25 MG tablet TAKE  ONE TABLET BY MOUTH TWICE A DAY 12/22/21   Minus Breeding, MD  nitroGLYCERIN (NITROSTAT) 0.4 MG SL tablet PLACE ONE TABLET UNDER THE TONGUE EVERY 5 MINUTES TIMES THREE DOSES AS NEEDED FOR CHEST PAIN. CALL 911 IF 2ND DOSE DOES NOT HELP 06/21/20   Minus Breeding, MD  olmesartan (BENICAR) 20 MG tablet Take 1 tablet (20 mg total) by mouth daily. 02/22/22   Haydee Salter, MD  rosuvastatin (CRESTOR) 40 MG tablet Take 1 tablet (40 mg total) by mouth every morning. 01/13/22   Minus Breeding, MD  sitaGLIPtin (JANUVIA) 25 MG tablet Take 1 tablet (25 mg total) by mouth daily. 05/02/22   Haydee Salter, MD    Physical Exam    Vital Signs:  Jairus Tonne does not have vital signs available for review today.  Given telephonic nature of communication, physical exam is limited. AAOx3. NAD. Normal affect.  Speech  and respirations are unlabored.  Accessory Clinical Findings    None  Assessment & Plan    1.  Preoperative Cardiovascular Risk Assessment:     Recommendations:  Antiplatelet and/or Anticoagulation Recommendations:     A copy of this note will be routed to requesting surgeon.  Time:   Today, I have spent  minutes with the patient with telehealth technology discussing medical history, symptoms, and management plan.     Elgie Collard, PA-C  07/21/2022, 9:49 AM

## 2022-07-22 ENCOUNTER — Other Ambulatory Visit: Payer: Self-pay | Admitting: Family Medicine

## 2022-07-22 DIAGNOSIS — I10 Essential (primary) hypertension: Secondary | ICD-10-CM

## 2022-07-24 ENCOUNTER — Telehealth: Payer: Self-pay | Admitting: *Deleted

## 2022-07-24 ENCOUNTER — Ambulatory Visit: Payer: Medicare PPO | Attending: Physician Assistant | Admitting: Physician Assistant

## 2022-07-24 DIAGNOSIS — Z01810 Encounter for preprocedural cardiovascular examination: Secondary | ICD-10-CM

## 2022-07-24 NOTE — Progress Notes (Signed)
Please see other encounter.

## 2022-07-24 NOTE — Telephone Encounter (Signed)
Pt agreeable to URGENT PRE OP TELE APPT ADD ON TODAY AT 3:40. Med rec not done. Consent has been given.      Patient Consent for Virtual Visit        Rogelio Wegman has provided verbal consent on 07/24/2022 for a virtual visit (video or telephone).   CONSENT FOR VIRTUAL VISIT FOR:  Forrest Moron  By participating in this virtual visit I agree to the following:  I hereby voluntarily request, consent and authorize [facility] and its employed or contracted physicians, physician assistants, nurse practitioners or other licensed health care professionals (the Practitioner), to provide me with telemedicine health care services (the "Services") as deemed necessary by the treating Practitioner. I acknowledge and consent to receive the Services by the Practitioner via telemedicine. I understand that the telemedicine visit will involve communicating with the Practitioner through live audiovisual communication technology and the disclosure of certain medical information by electronic transmission. I acknowledge that I have been given the opportunity to request an in-person assessment or other available alternative prior to the telemedicine visit and am voluntarily participating in the telemedicine visit.  I understand that I have the right to withhold or withdraw my consent to the use of telemedicine in the course of my care at any time, without affecting my right to future care or treatment, and that the Practitioner or I may terminate the telemedicine visit at any time. I understand that I have the right to inspect all information obtained and/or recorded in the course of the telemedicine visit and may receive copies of available information for a reasonable fee.  I understand that some of the potential risks of receiving the Services via telemedicine include:  Delay or interruption in medical evaluation due to technological equipment failure or disruption; Information transmitted may not be sufficient  (e.g. poor resolution of images) to allow for appropriate medical decision making by the Practitioner; and/or  In rare instances, security protocols could fail, causing a breach of personal health information.  Furthermore, I acknowledge that it is my responsibility to provide information about my medical history, conditions and care that is complete and accurate to the best of my ability. I acknowledge that Practitioner's advice, recommendations, and/or decision may be based on factors not within their control, such as incomplete or inaccurate data provided by me or distortions of diagnostic images or specimens that may result from electronic transmissions. I understand that the practice of medicine is not an exact science and that Practitioner makes no warranties or guarantees regarding treatment outcomes. I acknowledge that a copy of this consent can be made available to me via my patient portal ([facility]), or I can request a printed copy by calling the office of [facility].    I understand that my insurance will be billed for this visit.   I have read or had this consent read to me. I understand the contents of this consent, which adequately explains the benefits and risks of the Services being provided via telemedicine.  I have been provided ample opportunity to ask questions regarding this consent and the Services and have had my questions answered to my satisfaction. I give my informed consent for the services to be provided through the use of telemedicine in my medical care

## 2022-07-24 NOTE — Telephone Encounter (Signed)
Pt agreeable to URGENT PRE OP TELE APPT ADD ON TODAY AT 3:40. Med rec not done. Consent has been given.

## 2022-07-24 NOTE — Patient Instructions (Signed)
SURGICAL WAITING ROOM VISITATION Patients having surgery or a procedure may have no more than 2 support people in the waiting area - these visitors may rotate.   Children under the age of 98 must have an adult with them who is not the patient. If the patient needs to stay at the hospital during part of their recovery, the visitor guidelines for inpatient rooms apply. Pre-op nurse will coordinate an appropriate time for 1 support person to accompany patient in pre-op.  This support person may not rotate.    Please refer to the [facility] website for the visitor guidelines for Inpatients (after your surgery is over and you are in a regular room).    Your procedure is scheduled on: 07/27/22   Report to [facility] Main Entrance    Report to admitting at 10:00 AM   Call this number if you have problems the morning of surgery [phone]   Do not eat food or drink fluids :After Midnight.  FOLLOW BOWEL PREP AND ANY ADDITIONAL PRE OP INSTRUCTIONS YOU RECEIVED FROM YOUR SURGEON'S OFFICE!!!     Oral Hygiene is also important to reduce your risk of infection.                                    Remember - BRUSH YOUR TEETH THE MORNING OF SURGERY WITH YOUR REGULAR TOOTHPASTE   Take these medicines the morning of surgery with A SIP OF WATER: Amlodipine, Finasteride, Isosorbide, Metoprolol, Rosuvastatin   DO NOT TAKE ANY ORAL DIABETIC MEDICATIONS DAY OF YOUR SURGERY  How to Manage Your Diabetes Before and After Surgery  Why is it important to control my blood sugar before and after surgery? Improving blood sugar levels before and after surgery helps healing and can limit problems. A way of improving blood sugar control is eating a healthy diet by:  Eating less sugar and carbohydrates  Increasing activity/exercise  Talking with your doctor about reaching your blood sugar goals High blood sugars (greater than 180 mg/dL) can raise your risk of infections and slow your recovery, so you  will need to focus on controlling your diabetes during the weeks before surgery. Make sure that the doctor who takes care of your diabetes knows about your planned surgery including the date and location.  How do I manage my blood sugar before surgery? Check your blood sugar at least 4 times a day, starting 2 days before surgery, to make sure that the level is not too high or low. Check your blood sugar the morning of your surgery when you wake up and every 2 hours until you get to the Short Stay unit. If your blood sugar is less than 70 mg/dL, you will need to treat for low blood sugar: Do not take insulin. Treat a low blood sugar (less than 70 mg/dL) with  cup of clear juice (cranberry or apple), 4 glucose tablets, OR glucose gel. Recheck blood sugar in 15 minutes after treatment (to make sure it is greater than 70 mg/dL). If your blood sugar is not greater than 70 mg/dL on recheck, call [phone] for further instructions. Report your blood sugar to the short stay nurse when you get to Short Stay.  If you are admitted to the hospital after surgery: Your blood sugar will be checked by the staff and you will probably be given insulin after surgery (instead of oral diabetes medicines) to make sure you have  good blood sugar levels. The goal for blood sugar control after surgery is 80-180 mg/dL.   WHAT DO I DO ABOUT MY DIABETES MEDICATION?  Do not take oral diabetes medicines (pills) the morning of surgery.  THE DAY BEFORE SURGERY, take Januvia as prescribed.      THE MORNING OF SURGERY, do not take Januvia.  Reviewed and Endorsed by [facility] Patient Education Committee, August 2015                               You may not have any metal on your body including jewelry, and body piercing             Do not wear lotions, powders, cologne, or deodorant              Men may shave face and neck.   Do not bring valuables to the hospital. [place].   Contacts, dentures or bridgework may not be worn into surgery.  DO NOT [place]. PHARMACY WILL DISPENSE MEDICATIONS LISTED ON YOUR MEDICATION LIST TO YOU DURING YOUR ADMISSION [place]!    Patients discharged on the day of surgery will not be allowed to drive home.  Someone NEEDS to stay with you for the first 24 hours after anesthesia.              Please read over the following fact sheets you were given: IF YOU HAVE QUESTIONS ABOUT YOUR PRE-OP INSTRUCTIONS PLEASE CALL [facility] - Preparing for Surgery Before surgery, you can play an important role.  Because skin is not sterile, your skin needs to be as free of germs as possible.  You can reduce the number of germs on your skin by washing with CHG (chlorahexidine gluconate) soap before surgery.  CHG is an antiseptic cleaner which kills germs and bonds with the skin to continue killing germs even after washing. Please DO NOT use if you have an allergy to CHG or antibacterial soaps.  If your skin becomes reddened/irritated stop using the CHG and inform your nurse when you arrive at Short Stay. Do not shave (including legs and underarms) for at least 48 hours prior to the first CHG shower.  You may shave your face/neck.  Please follow these instructions carefully:  1.  Shower with CHG Soap the night before surgery and the  morning of surgery.  2.  If you choose to wash your hair, wash your hair first as usual with your normal  shampoo.  3.  After you shampoo, rinse your hair and body thoroughly to remove the shampoo.                             4.  Use CHG as you would any other liquid soap.  You can apply chg directly to the skin and wash.  Gently with a scrungie or clean washcloth.  5.  Apply the CHG Soap to your body ONLY FROM THE NECK DOWN.   Do   not use on face/ open                           Wound or open sores. Avoid contact  with eyes, ears mouth and    genitals (private parts).                       Wash face,  Genitals (private parts) with your normal soap.             6.  Wash thoroughly, paying special attention to the area where your    surgery  will be performed.  7.  Thoroughly rinse your body with warm water from the neck down.  8.  DO NOT shower/wash with your normal soap after using and rinsing off the CHG Soap.                9.  Pat yourself dry with a clean towel.            10.  Wear clean pajamas.            11.  Place clean sheets on your bed the night of your first shower and do not  sleep with pets. Day of Surgery : Do not apply any lotions/deodorants the morning of surgery.  Please wear clean clothes to the hospital/surgery center.  FAILURE TO FOLLOW THESE INSTRUCTIONS MAY RESULT IN THE CANCELLATION OF YOUR SURGERY  PATIENT SIGNATURE_________________________________  NURSE SIGNATURE__________________________________  ________________________________________________________________________

## 2022-07-24 NOTE — Progress Notes (Signed)
COVID Vaccine Completed:  Date of COVID positive in last 90 days:  PCP - Arlester Marker, MD Cardiologist - Minus Breeding, MD  Cardiac clearance by Nicholes Rough 07/21/22 Epic   Chest x-ray -  EKG - 05/18/22 Epic Stress Test -  ECHO - 06/12/22 Epic Cardiac Cath - 11/21/16 Epic Pacemaker/ICD device last checked: Spinal Cord Stimulator:  Bowel Prep -   Sleep Study -  CPAP -   Fasting Blood Sugar -  Checks Blood Sugar _____ times a day  Blood Thinner Instructions: Eliquis, hold 3 days Aspirin Instructions: Last Dose:  Activity level:  Can go up a flight of stairs and perform activities of daily living without stopping and without symptoms of chest pain or shortness of breath.  Able to exercise without symptoms  Unable to go up a flight of stairs without symptoms of     Anesthesia review: HTN, CAD, DM2, a fib, CHF, MI, fatty liver  Patient denies shortness of breath, fever, cough and chest pain at PAT appointment  Patient verbalized understanding of instructions that were given to them at the PAT appointment. Patient was also instructed that they will need to review over the PAT instructions again at home before surgery.

## 2022-07-24 NOTE — Addendum Note (Signed)
Addended by: Elgie Collard on: 07/24/2022 03:59 PM   Modules accepted: Level of Service

## 2022-07-24 NOTE — Telephone Encounter (Signed)
Patient is calling due to not being called at 11 am today for his clearance as advised by Tessa on 09/08. Please advise.

## 2022-07-25 ENCOUNTER — Encounter (HOSPITAL_COMMUNITY): Payer: Self-pay

## 2022-07-25 ENCOUNTER — Encounter (HOSPITAL_COMMUNITY)
Admission: RE | Admit: 2022-07-25 | Discharge: 2022-07-25 | Disposition: A | Discharge status: Home / Self Care | Payer: Medicare PPO | Source: Ambulatory Visit | Attending: Urology | Admitting: Urology

## 2022-07-25 VITALS — BP 147/97 | HR 75 | Temp 98.3°F | Resp 14 | Ht 71.0 in | Wt 225.0 lb

## 2022-07-25 DIAGNOSIS — Z01812 Encounter for preprocedural laboratory examination: Secondary | ICD-10-CM | POA: Diagnosis not present

## 2022-07-25 DIAGNOSIS — N433 Hydrocele, unspecified: Secondary | ICD-10-CM | POA: Insufficient documentation

## 2022-07-25 DIAGNOSIS — I4819 Other persistent atrial fibrillation: Secondary | ICD-10-CM | POA: Insufficient documentation

## 2022-07-25 DIAGNOSIS — E1122 Type 2 diabetes mellitus with diabetic chronic kidney disease: Secondary | ICD-10-CM | POA: Insufficient documentation

## 2022-07-25 DIAGNOSIS — Z8616 Personal history of COVID-19: Secondary | ICD-10-CM | POA: Diagnosis not present

## 2022-07-25 DIAGNOSIS — I129 Hypertensive chronic kidney disease with stage 1 through stage 4 chronic kidney disease, or unspecified chronic kidney disease: Secondary | ICD-10-CM | POA: Insufficient documentation

## 2022-07-25 DIAGNOSIS — Z87891 Personal history of nicotine dependence: Secondary | ICD-10-CM | POA: Insufficient documentation

## 2022-07-25 DIAGNOSIS — K769 Liver disease, unspecified: Secondary | ICD-10-CM | POA: Insufficient documentation

## 2022-07-25 DIAGNOSIS — E1159 Type 2 diabetes mellitus with other circulatory complications: Secondary | ICD-10-CM | POA: Diagnosis not present

## 2022-07-25 DIAGNOSIS — N183 Chronic kidney disease, stage 3 unspecified: Secondary | ICD-10-CM | POA: Insufficient documentation

## 2022-07-25 DIAGNOSIS — I2510 Atherosclerotic heart disease of native coronary artery without angina pectoris: Secondary | ICD-10-CM | POA: Insufficient documentation

## 2022-07-25 DIAGNOSIS — C679 Malignant neoplasm of bladder, unspecified: Secondary | ICD-10-CM | POA: Insufficient documentation

## 2022-07-25 DIAGNOSIS — I10 Essential (primary) hypertension: Secondary | ICD-10-CM

## 2022-07-25 LAB — CBC
HCT: 43.3 % (ref 39.0–52.0)
Hemoglobin: 14 g/dL (ref 13.0–17.0)
MCH: 28.7 pg (ref 26.0–34.0)
MCHC: 32.3 g/dL (ref 30.0–36.0)
MCV: 88.9 fL (ref 80.0–100.0)
Platelets: 157 10*3/uL (ref 150–400)
RBC: 4.87 MIL/uL (ref 4.22–5.81)
RDW: 14.6 % (ref 11.5–15.5)
WBC: 9.8 10*3/uL (ref 4.0–10.5)
nRBC: 0 % (ref 0.0–0.2)

## 2022-07-25 LAB — COMPREHENSIVE METABOLIC PANEL
ALT: 14 U/L (ref 0–44)
AST: 21 U/L (ref 15–41)
Albumin: 3.6 g/dL (ref 3.5–5.0)
Alkaline Phosphatase: 75 U/L (ref 38–126)
Anion gap: 7 (ref 5–15)
BUN: 37 mg/dL — ABNORMAL HIGH (ref 8–23)
CO2: 21 mmol/L — ABNORMAL LOW (ref 22–32)
Calcium: 8.9 mg/dL (ref 8.9–10.3)
Chloride: 112 mmol/L — ABNORMAL HIGH (ref 98–111)
Creatinine, Ser: 2.15 mg/dL — ABNORMAL HIGH (ref 0.61–1.24)
GFR, Estimated: 31 mL/min — ABNORMAL LOW (ref >60)
Glucose, Bld: 102 mg/dL — ABNORMAL HIGH (ref 70–99)
Potassium: 4.3 mmol/L (ref 3.5–5.1)
Sodium: 140 mmol/L (ref 135–145)
Total Bilirubin: 1.9 mg/dL — ABNORMAL HIGH (ref 0.3–1.2)
Total Protein: 7.3 g/dL (ref 6.5–8.1)

## 2022-07-25 LAB — HEMOGLOBIN A1C
Hgb A1c MFr Bld: 7.2 % — ABNORMAL HIGH (ref 4.8–5.6)
Mean Plasma Glucose: 159.94 mg/dL

## 2022-07-25 LAB — GLUCOSE, CAPILLARY: Glucose-Capillary: 99 mg/dL (ref 70–99)

## 2022-07-25 NOTE — Progress Notes (Signed)
Creatinine 2.15, results routed to Dr. [name]

## 2022-07-26 DIAGNOSIS — N1832 Chronic kidney disease, stage 3b: Secondary | ICD-10-CM | POA: Diagnosis not present

## 2022-07-26 NOTE — H&P (Signed)
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 Prostate cancer  4. Left hydrocele  5. BPH/hematuria   [name] returns today for continued evaluation of the above urologic issues. He continues to have intermittent hematuria. This appears to be more problematic when he is more active. He remains on Eliquis due to atrial fibrillation. Of note, he did undergo an echocardiogram approximately 1 week ago that demonstrated preserved left ventricular function with an ejection fraction of 60 to 65%. No clots were noted and his valvular function apparently was satisfactory. He has begun to have increased discomfort and bother from his enlarged left hydrocele. He is interested in having this repaired. He unfortunately did not have his PSA drawn prior to his appointment today. His PSA had increased in April and was supposed to be checked prior to his appointment today. He does have a history of low risk prostate cancer that has been followed with his last biopsy having been in September 2021 that was benign. His PSA has fluctuated significantly over time likely related to his multiple interventions for his bladder cancer. His voiding symptoms overall remain relatively stable.     ALLERGIES: No Allergies    MEDICATIONS: Atorvastatin Calcium 20 MG Oral Tablet Oral  Eliquis 5 mg tablet Oral  MetFORMIN HCl - 500 MG Oral Tablet Oral  Metoprolol Tartrate 25 MG Oral Tablet Oral  Nitrostat 0.4 MG Sublingual Tablet Sublingual Sublingual  Tribenzor 40 mg-10 mg-25 mg tablet Oral     GU PSH: Bladder Instill AntiCA Agent - 01/13/2022, 01/06/2022, 12/30/2021, 07/08/2021, 06/30/2021, 06/23/2021, 04/29/2021, 04/19/2021, 03/29/2021, 10/18/2020, 10/11/2020, 09/30/2020, 09/23/2020, 09/16/2020, 09/02/2020, 2021, 2021, 2021, 2021, 2021, 2021 Cysto Bladder Ureth Biopsy - 11/10/2021, 2021 Cysto Uretero Biopsy Fulgura, Right - 2021 Cystoscopy - 03/03/2022, 09/28/2021,  06/03/2021, 01/19/2021, 06/23/2020, 2021, 2019 Cystoscopy TURBT 2-5 cm - 02/10/2021, 08/12/2020 Cystoscopy Ureteroscopy, Bilateral - 2019 Inject For cystogram - 2021 Lap Nephro Ureterectomy, Right - 2021 Locm 300-'399Mg'$ /Ml Iodine,1Ml - 2021, 2021, 2019 Prostate Needle Biopsy - 08/12/2020, 2018       [facility] Notes: Knee Arthroscopy   NON-GU PSH: Surgical Pathology, Gross And Microscopic Examination For Prostate Needle - 2018     GU PMH: Bladder Cancer overlapping sites - 03/03/2022, - 01/13/2022, - 01/06/2022, - 12/30/2021, - 12/23/2021, - 07/08/2021, - 06/30/2021, - 06/23/2021, - 05/27/2021, - 04/29/2021, - 04/19/2021, - 04/08/2021, - 03/29/2021, - 02/23/2021, - 01/19/2021, - 10/18/2020, - 10/11/2020, - 09/30/2020, - 09/23/2020, - 09/16/2020, - 09/09/2020, - 09/02/2020, - 08/24/2020 Prostate Cancer - 03/03/2022, - 11/29/2021, - 06/03/2021, - 02/23/2021, - 01/19/2021, - 08/24/2020 (Stable), His prostate remains benign to exam. I will obtain a PSA and have recommended he return in 6 months for repeat DRE and PSA., - 2020 (Stable), His prostate was noted to be entirely benign today. I will obtain a PSA today since he has due for a recheck., - 2019 (Stable), I went over his pathology report with him today which has revealed no evidence of grade or stage progression of his low risk prostate cancer. My recommendation to him has been continued active surveillance and he has agreed with this plan., - 2018 (Stable), I have discussed with the patient the possibility of blood per rectum, per urethra and in the ejaculate. He was counseled to contact me if he has any difficulties following his prostate biopsy whatsoever., - 2018 (Stable), His prostate was noted to be smooth and benign to examination however his PSA has risen  significantly. It was 8.3 when we did his biopsy initially and then came down quite low. I recommended we repeat the PSA today but also I told him this time we proceed with a repeat biopsy., - 2018 (Stable), His prostate remains  benign on his examination and his PSA continues to remain low and stable. I will continue active surveillance with DRE and PSA again in 6 months., - 2017, Adenocarcinoma of prostate, - 2017 CIS of the bladder - 02/27/2022, - 11/29/2021, - 09/28/2021, - 06/03/2021, - 2021 Renal pelvis cancer, right - 02/27/2022, - 06/03/2021, - 02/23/2021, - 01/19/2021, - 08/24/2020 (Stable), - 2021, Right, - 2021 Hydrocele, Unspec - 11/29/2021, Hydrocele, left, - 2015 Microscopic hematuria - 09/28/2021 Abdominal Pain Unspec - 04/19/2021 Abnormal radiologic findings on diagnostic imaging of renal pelvis, ureter, or bladder, Bilateral - 08/13/2020, Bilateral, Due to the finding of abnormalities of both his right ureter and left renal pelvic region we are going to proceed with further evaluation with cystoscopy, bilateral retrograde pyelography, bilateral ureteroscopy and possible bilateral double-J stent placement., - 2019 Gross hematuria, Continues to have intermittent gross hematuria. It seems to be associated with some pain in his right flank region but no history of stones. With his previous abnormality of his ureter I have recommended we repeat a CT scan with and without contrast and then will determine further evaluation once that is been completed. - 2020, He experienced gross hematuria. He is on Eliquis. My suspicion is that this is going to be from the prostate since no other abnormality was noted previously., - 2020 Hydronephrosis (Stable), Right, He has mild right hydronephrosis noted on his CT scan that is unchanged and has been evaluated and noted to be nonobstructive in nature. - 2020 Microscopic hematuria, He was noted to have microscopic hematuria today. We therefore have discussed the need for further evaluation. He did not appear to have any infection but did have a few white cells so I am going to culture his urine. We will then obtain a creatinine and schedule him for a CT scan to evaluate the upper tract and will  then have him return for lower tract evaluation with cystoscopy. - 2019 BPH w/o LUTS (Stable), He does have some slight prostatic enlargement on exam but does not have significant voiding symptoms. - 2018 BPH w/LUTS (Stable), He has some BPH by exam with nocturia 2 but he said it is not significant enough that he would want to consider any form of pharmacologic therapy at this time. - 2017, Benign prostatic hyperplasia with urinary obstruction, - 2016 Nocturia (Stable), His nocturia is not a significant bother to him. 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 He is s/p right RAL nephroureterectomy on 03/22/20.   Diagnosis: Extensive CIS of the right renal pelvis and ureter  Baseline renal function: Cr 1.77   Feb 2021: CIS of the bladder and right upper tract CIS  Feb-Mar 2021: 6 week induction BCG  May 2021: Right RAL nephroureterectomy  Aug 2021: Diffuse erythematous changes of bladder  Sep 2021: TUR of bladder tumor, High grade Ta urothelial carcinoma  Oct-Dec 2021: Repeat 6 week induction BCG  Mar 2022: Suspicious cystoscopy and cytology, TURBT/biopsy - Benign  May-Jun 2022: 3 week maintenance BCG  Aug 2022: 3 week maintenance BCG  Nov 2022:  Suspicious cytology  Dec 2022: Bladder biopsies - Inflammation, no malignancy, L RPG - normal  Feb-Mar 2023: 3 week maintenance BCG  Apr 2023: Negative cystoscopy but suspicious cytology (discussed going back to the OR for further evaluation but he declined base on negative evaluation in Dec 2022)    2) Prostate cancer: His PSA in 1/13 was found to be 8.33. He had no worrisome nodularity or induration noted on DRE but did have a mild rectal stricture. He underwent TRUS/BX on 01/18/12 at which time his prostate was noted to be 56 cc.  Pathology: He was found to have a single  core positive for Gleason 3+3 = 6 adenocarcinoma involving 10% of that core.  Treatment: Active surveillance  Repeat TRUS/BX 07/24/17: Prostate volume - 57 cc  Pathology: 1 positive for Gleason 6 in 5% from the left apex laterally.  Treatment: Continued active surveillance.   Surveillance:  Sep 2021: 12 core biopsy - Benign with chronic inflammation, Vol 53 cc   3) BPH with LUTS: He reported having some slowing of his urinary stream as well as nocturia.  Treatment: None currently   4) Left hydrocele: At one point he found it to be a bother but he says it really not giving him any trouble other than occasionally when he crosses his legs. It is not increasing in size.       NON-GU PMH: Bacteriuria (Stable) - 02/23/2021, - 2/95/1884 Neoplasm of uncertain behavior of trachea, bronchus and lung - 2021 Atherosclerotic heart disease of native coronary artery without angina pectoris, CAD (coronary artery disease) - 2016 Stenosis of anus and rectum, Rectal stricture - 2016 Atrial Fibrillation Diabetes Type 2 Hypercholesterolemia Hypertension    FAMILY HISTORY: Family Health Status - Mother's Age - Runs In Family Family Health Status Number - Runs In Family Father Deceased At [employer] ___ - Runs In Family Hypertension - Mother   SOCIAL HISTORY: Marital Status: Married Preferred Language: English; Race: White Has never drank.  Drinks 3 caffeinated drinks per day.     Notes: Former smoker, Occupation:, Marital History - Currently Married, Caffeine Use, Alcohol Use   REVIEW OF SYSTEMS:    GU Review Male:   Patient denies frequent urination, hard to postpone urination, burning/ pain with urination, get up at night to urinate, leakage of urine, stream starts and stops, trouble starting your streams, and have to strain to urinate .  Gastrointestinal (Upper):   Patient denies nausea and vomiting.  Gastrointestinal (Lower):   Patient denies diarrhea and constipation.  Constitutional:   Patient  denies fever, night sweats, weight loss, and fatigue.  Skin:   Patient denies skin rash/ lesion and itching.  Eyes:   Patient denies blurred vision and double vision.  Ears/ Nose/ Throat:   Patient denies sore throat and sinus problems.  Hematologic/Lymphatic:   Patient denies swollen glands and easy bruising.  Cardiovascular:   Patient denies leg swelling and chest pains.  Respiratory:   Patient denies cough and shortness of breath.  Endocrine:   Patient denies excessive thirst.  Musculoskeletal:   Patient denies back pain and joint pain.  Neurological:   Patient denies dizziness and headaches.  Psychologic:   Patient denies depression and anxiety.    MULTI-SYSTEM PHYSICAL EXAMINATION:    Constitutional: Well-nourished. No physical deformities. Normally developed. Good grooming.  Respiratory: No labored breathing, no use of accessory muscles. Clear bilaterally.  Cardiovascular: Irregular.  Neurologic / Psychiatric: Oriented to time, oriented to place, oriented to person. No depression, no anxiety,  no agitation.  Gastrointestinal: No mass, no tenderness, no rigidity, non obese abdomen.     Complexity of Data:  Records Review:   Pathology Reports, Previous Patient Records  X-Ray Review: MRI Pelvis: Reviewed Films.  MRI Abdomen: Reviewed Films.     02/27/22 05/27/21 06/15/20 10/21/19 02/27/19 01/08/18 07/11/17 06/07/17  PSA  Total PSA 9.66 ng/mL 5.54 ng/mL 8.69 ng/mL 2.39 ng/mL 3.27 ng/mL 1.67 ng/mL 4.28 ng/mL 10.80 ng/mL   Notes:                     CLINICAL DATA: Multifocal urothelial carcinoma of bladder and right  ureter. Prior right nephroureterectomy.   EXAM:  MRI ABDOMEN AND PELVIS WITHOUT AND WITH CONTRAST   TECHNIQUE:  Multiplanar multisequence MR imaging of the abdomen and pelvis was  performed both before and after the administration of intravenous  contrast.   CONTRAST: 61m GADAVIST GADOBUTROL 1 MMOL/ML IV SOLN   COMPARISON: CT on 02/25/2019   FINDINGS:   COMBINED FINDINGS FOR BOTH MR ABDOMEN AND PELVIS   Lower Chest: No acute findings.   Hepatobiliary: No hepatic masses identified. Gallbladder is  unremarkable. No evidence of biliary ductal dilatation.   Pancreas: No mass or inflammatory changes.   Spleen: Within normal limits in size and appearance.   Adrenals/Urinary Tract: Both adrenal glands are normal in  appearance. Postop changes are seen from previous right  nephroureterectomy. A tiny sub-cm benign left renal cyst is seen,  however no masses are seen involving left renal parenchymal or renal  collecting system. Ureters are nondilated and no masses are  identified. Focal areas of wall thickening are seen along the right  superior bladder wall, consistent with bladder carcinoma.   Stomach/Bowel: No evidence of obstruction, inflammatory process or  abnormal fluid collections.   Vascular/Lymphatic: 10 mm left external iliac lymph node is seen on  image 35/37. Other sub-centimeter lymph nodes are seen in bilateral  iliac chains in left para-aortic region, which are not considered  pathologically enlarged. No acute vascular findings.   Reproductive: No mass or other significant abnormality.   Other: None.   Musculoskeletal: No suspicious bone lesions identified.   IMPRESSION:  Focal areas of wall thickening and enhancement along the right  superior bladder wall, consistent with bladder carcinoma.   10 mm left external iliac lymph node. Metastatic disease cannot be  excluded.   Prior right nephroureterectomy. No evidence of left renal mass or  hydronephrosis. (Suggest follow-up imaging with CT urography due to  its increased spatial resolution, rather than MRI.)    Electronically Signed  By: JMarlaine HindM.D.  On: 06/19/2022 08:59   Creatinine was 2.2.           Urinalysis w/Scope Dipstick Dipstick Cont'd Micro  Color: Red Bilirubin: Neg mg/dL WBC/hpf: 0 - 5/hpf  Appearance: Cloudy Ketones: Neg mg/dL  RBC/hpf: >60/hpf  Specific Gravity: 1.025 Blood: 3+ ery/uL Bacteria: NS (Not Seen)  pH: 6.0 Protein: 1+ mg/dL Cystals: NS (Not Seen)  Glucose: Neg mg/dL Urobilinogen: 1.0 mg/dL Casts: NS (Not Seen)    Nitrites: Neg Trichomonas: Not Present    Leukocyte Esterase: 1+ leu/uL Mucous: Not Present      Epithelial Cells: 0 - 5/hpf      Yeast: NS (Not Seen)      Sperm: Not Present    ASSESSMENT:      ICD-10 Details  1 GU:   Bladder Cancer overlapping sites - C67.8   2   Prostate Cancer - C61  3   Hydrocele - N43.0   4   BPH w/LUTS - N40.1   5   Weak Urinary Stream - R39.12    PLAN:         1. Urothelial carcinoma: We discussed his MRI findings which do suggest some thickening of the right anterior bladder as well as a borderline left pelvic lymph node measuring 10 mm. Considering his previously noted suspicious cytologies, his cytology will be repeated today and we have discussed proceeding with further evaluation endoscopically including cystoscopy, exam under anesthesia, left retrograde pyelography, site selected biopsies including a biopsy of the right anterior bladder wall considering his MR findings. If his evaluation proves to be negative, he will need continued close surveillance imaging likely with a CT scan without contrast of the chest, abdomen, and pelvis in 3 months to ensure no progressive development of metastatic disease.   2. Left hydrocele: This has enlarged and is more bothersome to him. He will undergo scrotal ultrasound for definitive diagnosis. Assuming that this is indeed a simple left hydrocele as suspected, we have reviewed options and he would like to proceed with surgical treatment. Considering that we plan to go to the operating room regardless, he will undergo left hydrocele repair. We have reviewed the potential risks, complications, and the expected recovery process from this procedure. He likely will have a drain for a few days. He understands that he will need to  stop Eliquis at least 48 hours prior.   3. Prostate cancer: Unfortunately, his PSA was not drawn prior to his appointment today as ordered. As such, this will be checked today. Assuming that this is fluctuating and does not raise additional concern, he will continue with active surveillance management with a PSA/DRE in 6 months. We will potentially consider MRI of the prostate in the next 1 to 2 years and plan to avoid additional biopsies unless absolutely indicated. If his PSA has significantly increased further, we will consider proceeding with a biopsy at the time of his upcoming surgical procedure.   4. BPH/hematuria: It appears that his hematuria is likely related to his BPH and then exacerbated by his anticoagulation. We have discussed beginning finasteride 5 mg daily and reviewed proper use and potential side effects of this medication. He will begin this at this time.

## 2022-07-26 NOTE — Progress Notes (Signed)
Anesthesia Chart Review   Case: [id number] Date/Time: 07/27/22 1200   Procedures:      HYDROCELE REPAIR (Left) - GENERAL ANESTHESIA WITH PARALYSIS     CYSTOSCOPY WITH  BLADDER BIOPSY VERSUS TRANSURETHRAL RESECTION OF BLADDER TUMOR/CYSTOSCOPY/ EXAM UNDER ANESTHESIA/ LEFT RETROGRADE PYELOGRAM (Left)   Anesthesia type: General   Pre-op diagnosis: LEFT HYDROCELE, BLADDER CANCER   Location: [facility] 03 / [facility]   Surgeons: [name], MD       DISCUSSION:76 y.o. former smoker with h/o HTN, DM II, CAD, PAF, CKD Stage III, left hydrocele, bladder cancer scheduled for above procedure 07/27/2022 with Dr. [name].   Pt seen by cardiology 07/21/2022. Per OV note, "Mr. Goates's perioperative risk of a major cardiac event is 6.6% according to the Revised Cardiac Risk Index (RCRI).  Therefore, he is at high risk for perioperative complications.   His functional capacity is good at 5.07 METs according to the Duke Activity Status Index (DASI). Recommendations: According to ACC/AHA guidelines, no further cardiovascular testing needed.  The patient may proceed to surgery at acceptable risk.   Antiplatelet and/or Anticoagulation Recommendations:   Eliquis (Apixaban) can be held for 3 days prior to surgery.  Please resume post op when felt to be safe.  "  Anticipate pt can proceed with planned procedure barring acute status change.   VS: BP (!) 147/97   Pulse 75   Temp 36.8 C (Oral)   Resp 14   Ht '5\' 11"'$  (1.803 m)   Wt 102.1 kg   SpO2 95%   BMI 31.38 kg/m   PROVIDERS: Haydee Salter, MD is PCP   Cardiologist - Minus Breeding, MD LABS: Labs reviewed: Acceptable for surgery. (all labs ordered are listed, but only abnormal results are displayed)  Labs Reviewed  HEMOGLOBIN A1C - Abnormal; Notable for the following components:      Result Value   Hgb A1c MFr Bld 7.2 (*)    All other components within normal limits  COMPREHENSIVE METABOLIC PANEL - Abnormal; Notable for the following  components:   Chloride 112 (*)    CO2 21 (*)    Glucose, Bld 102 (*)    BUN 37 (*)    Creatinine, Ser 2.15 (*)    Total Bilirubin 1.9 (*)    GFR, Estimated 31 (*)    All other components within normal limits  GLUCOSE, CAPILLARY  CBC     IMAGES:   EKG:   CV: Echo 06/12/2022 1. Left ventricular ejection fraction, by estimation, is 60 to 65%. The  left ventricle has normal function. The left ventricle has no regional  wall motion abnormalities. There is moderate left ventricular hypertrophy  of the basal-septal segment. Left  ventricular diastolic parameters are indeterminate.   2. Right ventricular systolic function is normal. The right ventricular  size is normal. There is mildly elevated pulmonary artery systolic  pressure.   3. Left atrial size was mildly dilated.   4. The mitral valve is normal in structure. Mild mitral valve  regurgitation. No evidence of mitral stenosis.   5. The aortic valve is tricuspid. There is mild calcification of the  aortic valve. Aortic valve regurgitation is not visualized. Aortic valve  sclerosis is present, with no evidence of aortic valve stenosis.   6. The inferior vena cava is normal in size with greater than 50%  respiratory variability, suggesting right atrial pressure of 3 mmHg.  Past Medical History:  Diagnosis Date   Abnormal radiologic findings on diagnostic imaging  of renal pelvis, ureter, or bladder    bilateral ureter abnormalities   Anticoagulant long-term use    eliquis   Anxiety    Arthritis    CAD (coronary artery disease) cardiologist-- dr hochrein   NSTEMI 02-04-2014  per cardiac cath chronic occluded RCA w/ faint left-to-right collaterals and aneurysmal LCFx with sluggish coronary flow/   NSTEMI --11-21-2016 per cardiac cath occluded proximal RCA & mid to diastal CFX 100%, med rx. If that does not work, PTCA or CABG   Cancer [facility])    bladder and kidney   CKD (chronic kidney disease), stage III ([place])    patient  unaware   DOE (dyspnea on exertion)    2-3 flights of stairs   Fatty liver    pt denies   Hematuria 02/2019   History of COVID-19 10/2019   History of non-ST elevation myocardial infarction (NSTEMI)    02-04-2014  and 11-21-2016  cardiac cath done both times ,  medically management   History of shingles 12/2017   slight pain and numbness still noted in the area   Hyperlipidemia    Hypertension    Insomnia    Myocardial infarction [facility]) 2015   Persistent atrial fibrillation [facility])    cardiologsit-- dr hochrein   Thoracic aortic atherosclerosis ([place])    Type 2 diabetes mellitus ([place])    Urinary frequency     Past Surgical History:  Procedure Laterality Date   CARDIAC CATHETERIZATION N/A 11/21/2016   Procedure: Left Heart Cath and Coronary Angiography;  Surgeon: Troy Sine, MD;  Location: [place] [facility] LAB;  Service: Cardiovascular;  Laterality: N/A;  pRCA 100% , ostial LAD 45%, OM3 80%, mCFX to dCFX 100% (AV groove), lateral OM3 50%   COLONOSCOPY     CYSTOSCOPY WITH BIOPSY N/A 08/12/2020   Procedure: CYSTOSCOPY WITH BLADDER BIOPSY AND TRANSURETHRAL RESECTION OF BLADDER TUMOR;  Surgeon: [name], MD;  [facility];  Service: Urology;  Laterality: N/A;   CYSTOSCOPY WITH BIOPSY N/A 11/10/2021   Procedure: CYSTOSCOPY WITH BLADDER BIOPSIES/ LEFT RETROGRADE;  Surgeon: [name], MD;  [facility];  Service: Urology;  Laterality: N/A;   CYSTOSCOPY WITH URETEROSCOPY AND STENT PLACEMENT Right 12/15/2019   Procedure: CYSTOSCOPY WITH RIGHT RETROGRADE/ RIGHT URETEROSCOPY/ BIOPSY;  Surgeon: [name], MD;  Location: [place];  Service: Urology;  Laterality: Right;   CYSTOSCOPY/RETROGRADE/URETEROSCOPY Bilateral 03/18/2018   Procedure: CYSTOSCOPY/RETROGRADE/URETEROSCOPY.;  Surgeon: [name], MD;  Location: [facility];  Service: Urology;  Laterality: Bilateral;   EYE SURGERY Bilateral    cateract in January 2023   KNEE ARTHROSCOPY  Right    LEFT HEART CATHETERIZATION WITH CORONARY ANGIOGRAM N/A 02/04/2014   Procedure: LEFT HEART CATHETERIZATION WITH CORONARY ANGIOGRAM;  Surgeon: [name], MD;  Location: [place] CATH LAB;  Service: Cardiovascular;  Laterality: N/A;  severe one-vessel CAD, chronically occluded RCA with faint left-to-right collaterals;  aneurysmal LCFx with sluggish coronary flow;  normal LVSF w/ moderately elevated LVEDP (ostialOM2 20%, pOM3 20%, pD1 20%, mCFX 50%, diffuse 20% pCFX)   PROSTATE BIOPSY N/A 08/12/2020   Procedure: BIOPSY TRANSRECTAL ULTRASONIC PROSTATE (TUBP);  Surgeon: [name], MD;  [facility];  Service: Urology;  Laterality: N/A;   ROBOT ASSITED LAPAROSCOPIC NEPHROURETERECTOMY Right 03/22/2020   Procedure: XI ROBOT ASSITED LAPAROSCOPIC NEPHROURETERECTOMY;  Surgeon: [name], MD;  [facility];  Service: Urology;  Laterality: Right;   TRANSTHORACIC ECHOCARDIOGRAM  11-22-2016   dr hochrein   ef 55-60%/ mild MR and TR/  moderate LAE   TRANSURETHRAL RESECTION OF BLADDER TUMOR N/A 02/10/2021   Procedure: TRANSURETHRAL RESECTION OF BLADDER TUMOR (TURBT)/ CYSTOSCOPY/ LEFT RETROGRADE;  Surgeon: [name], MD;  [facility];  Service: Urology;  Laterality: N/A;  GENERAL ANESTHESIA WITH PARALYSIS    MEDICATIONS:  amLODipine (NORVASC) 5 MG tablet   cefpodoxime (VANTIN) 200 MG tablet   diphenhydramine-acetaminophen (TYLENOL PM) 25-500 MG TABS tablet   ELIQUIS 5 MG TABS tablet   finasteride (PROSCAR) 5 MG tablet   furosemide (LASIX) 20 MG tablet   furosemide (LASIX) 40 MG tablet   isosorbide mononitrate (IMDUR) 30 MG 24 hr tablet   metoprolol tartrate (LOPRESSOR) 25 MG tablet   nitroGLYCERIN (NITROSTAT) 0.4 MG SL tablet   olmesartan (BENICAR) 20 MG tablet   rosuvastatin (CRESTOR) 40 MG tablet   sitaGLIPtin (JANUVIA) 25 MG tablet   No current facility-administered medications for this encounter.    Adam Felix Ward, PA-C [facility] [phone]

## 2022-07-27 ENCOUNTER — Other Ambulatory Visit: Payer: Self-pay

## 2022-07-27 ENCOUNTER — Ambulatory Visit (HOSPITAL_BASED_OUTPATIENT_CLINIC_OR_DEPARTMENT_OTHER): Payer: Medicare PPO | Admitting: Anesthesiology

## 2022-07-27 ENCOUNTER — Encounter (HOSPITAL_COMMUNITY): Payer: Self-pay | Admitting: Urology

## 2022-07-27 ENCOUNTER — Ambulatory Visit (HOSPITAL_COMMUNITY)
Admission: RE | Admit: 2022-07-27 | Discharge: 2022-07-27 | Disposition: A | Discharge status: Home / Self Care | Payer: Medicare PPO | Attending: Urology | Admitting: Urology

## 2022-07-27 ENCOUNTER — Encounter (HOSPITAL_COMMUNITY)
Admission: RE | Disposition: A | Discharge status: Home / Self Care | Payer: Self-pay | Source: Home / Self Care | Attending: Urology

## 2022-07-27 ENCOUNTER — Ambulatory Visit (HOSPITAL_COMMUNITY): Payer: Medicare PPO

## 2022-07-27 ENCOUNTER — Ambulatory Visit (HOSPITAL_COMMUNITY): Payer: Medicare PPO | Admitting: Physician Assistant

## 2022-07-27 DIAGNOSIS — F419 Anxiety disorder, unspecified: Secondary | ICD-10-CM

## 2022-07-27 DIAGNOSIS — N433 Hydrocele, unspecified: Secondary | ICD-10-CM | POA: Insufficient documentation

## 2022-07-27 DIAGNOSIS — I4891 Unspecified atrial fibrillation: Secondary | ICD-10-CM | POA: Insufficient documentation

## 2022-07-27 DIAGNOSIS — N189 Chronic kidney disease, unspecified: Secondary | ICD-10-CM | POA: Insufficient documentation

## 2022-07-27 DIAGNOSIS — N452 Orchitis: Secondary | ICD-10-CM | POA: Diagnosis not present

## 2022-07-27 DIAGNOSIS — I509 Heart failure, unspecified: Secondary | ICD-10-CM | POA: Diagnosis not present

## 2022-07-27 DIAGNOSIS — I2510 Atherosclerotic heart disease of native coronary artery without angina pectoris: Secondary | ICD-10-CM | POA: Diagnosis not present

## 2022-07-27 DIAGNOSIS — N3289 Other specified disorders of bladder: Secondary | ICD-10-CM | POA: Diagnosis not present

## 2022-07-27 DIAGNOSIS — N3020 Other chronic cystitis without hematuria: Secondary | ICD-10-CM | POA: Diagnosis not present

## 2022-07-27 DIAGNOSIS — Z7984 Long term (current) use of oral hypoglycemic drugs: Secondary | ICD-10-CM | POA: Diagnosis not present

## 2022-07-27 DIAGNOSIS — E1122 Type 2 diabetes mellitus with diabetic chronic kidney disease: Secondary | ICD-10-CM | POA: Diagnosis not present

## 2022-07-27 DIAGNOSIS — C679 Malignant neoplasm of bladder, unspecified: Secondary | ICD-10-CM

## 2022-07-27 DIAGNOSIS — M1990 Unspecified osteoarthritis, unspecified site: Secondary | ICD-10-CM

## 2022-07-27 DIAGNOSIS — I130 Hypertensive heart and chronic kidney disease with heart failure and stage 1 through stage 4 chronic kidney disease, or unspecified chronic kidney disease: Secondary | ICD-10-CM | POA: Diagnosis not present

## 2022-07-27 DIAGNOSIS — C61 Malignant neoplasm of prostate: Secondary | ICD-10-CM | POA: Insufficient documentation

## 2022-07-27 DIAGNOSIS — Z8249 Family history of ischemic heart disease and other diseases of the circulatory system: Secondary | ICD-10-CM | POA: Insufficient documentation

## 2022-07-27 DIAGNOSIS — I781 Nevus, non-neoplastic: Secondary | ICD-10-CM | POA: Insufficient documentation

## 2022-07-27 DIAGNOSIS — R319 Hematuria, unspecified: Secondary | ICD-10-CM | POA: Diagnosis not present

## 2022-07-27 DIAGNOSIS — E1159 Type 2 diabetes mellitus with other circulatory complications: Secondary | ICD-10-CM

## 2022-07-27 DIAGNOSIS — Z8589 Personal history of malignant neoplasm of other organs and systems: Secondary | ICD-10-CM | POA: Insufficient documentation

## 2022-07-27 DIAGNOSIS — Z8553 Personal history of malignant neoplasm of renal pelvis: Secondary | ICD-10-CM | POA: Diagnosis not present

## 2022-07-27 DIAGNOSIS — N401 Enlarged prostate with lower urinary tract symptoms: Secondary | ICD-10-CM | POA: Insufficient documentation

## 2022-07-27 DIAGNOSIS — I252 Old myocardial infarction: Secondary | ICD-10-CM | POA: Insufficient documentation

## 2022-07-27 DIAGNOSIS — Z8551 Personal history of malignant neoplasm of bladder: Secondary | ICD-10-CM | POA: Insufficient documentation

## 2022-07-27 DIAGNOSIS — Z8554 Personal history of malignant neoplasm of ureter: Secondary | ICD-10-CM | POA: Insufficient documentation

## 2022-07-27 DIAGNOSIS — K769 Liver disease, unspecified: Secondary | ICD-10-CM

## 2022-07-27 DIAGNOSIS — Z87891 Personal history of nicotine dependence: Secondary | ICD-10-CM | POA: Insufficient documentation

## 2022-07-27 DIAGNOSIS — D303 Benign neoplasm of bladder: Secondary | ICD-10-CM | POA: Diagnosis not present

## 2022-07-27 HISTORY — PX: HYDROCELE EXCISION: SHX482

## 2022-07-27 HISTORY — PX: CYSTOSCOPY WITH BIOPSY: SHX5122

## 2022-07-27 LAB — GLUCOSE, CAPILLARY
Glucose-Capillary: 108 mg/dL — ABNORMAL HIGH (ref 70–99)
Glucose-Capillary: 138 mg/dL — ABNORMAL HIGH (ref 70–99)

## 2022-07-27 SURGERY — HYDROCELECTOMY
Anesthesia: General | Laterality: Left

## 2022-07-27 MED ORDER — FLUORESCEIN SODIUM 10 % IV SOLN
INTRAVENOUS | Status: DC | PRN
  Administered 2022-07-27: 250 mg via INTRAVENOUS

## 2022-07-27 MED ORDER — DEXAMETHASONE SODIUM PHOSPHATE 10 MG/ML IJ SOLN
INTRAMUSCULAR | Status: AC
  Filled 2022-07-27: qty 1

## 2022-07-27 MED ORDER — CEFAZOLIN SODIUM-DEXTROSE 2-4 GM/100ML-% IV SOLN
2.0000 g | Freq: Once | INTRAVENOUS | Status: AC
  Administered 2022-07-27: 2 g via INTRAVENOUS
  Filled 2022-07-27: qty 100

## 2022-07-27 MED ORDER — PROPOFOL 10 MG/ML IV BOLUS
INTRAVENOUS | Status: AC
  Filled 2022-07-27: qty 20

## 2022-07-27 MED ORDER — FLUORESCEIN SODIUM 10 % IV SOLN
INTRAVENOUS | Status: AC
  Filled 2022-07-27: qty 5

## 2022-07-27 MED ORDER — FENTANYL CITRATE (PF) 100 MCG/2ML IJ SOLN
INTRAMUSCULAR | Status: DC | PRN
  Administered 2022-07-27: 25 ug via INTRAVENOUS
  Administered 2022-07-27: 50 ug via INTRAVENOUS
  Administered 2022-07-27 (×2): 25 ug via INTRAVENOUS
  Administered 2022-07-27: 50 ug via INTRAVENOUS
  Administered 2022-07-27: 25 ug via INTRAVENOUS

## 2022-07-27 MED ORDER — ROCURONIUM BROMIDE 10 MG/ML (PF) SYRINGE
PREFILLED_SYRINGE | INTRAVENOUS | Status: DC | PRN
  Administered 2022-07-27: 70 mg via INTRAVENOUS

## 2022-07-27 MED ORDER — SUGAMMADEX SODIUM 200 MG/2ML IV SOLN
INTRAVENOUS | Status: DC | PRN
  Administered 2022-07-27: 200 mg via INTRAVENOUS

## 2022-07-27 MED ORDER — FENTANYL CITRATE (PF) 100 MCG/2ML IJ SOLN
INTRAMUSCULAR | Status: AC
  Filled 2022-07-27: qty 2

## 2022-07-27 MED ORDER — HYDROCODONE-ACETAMINOPHEN 5-325 MG PO TABS: 1.0000 | ORAL_TABLET | Freq: Four times a day (QID) | ORAL | 0 refills | Status: DC | PRN

## 2022-07-27 MED ORDER — LIDOCAINE 2% (20 MG/ML) 5 ML SYRINGE
INTRAMUSCULAR | Status: DC | PRN
  Administered 2022-07-27: 60 mg via INTRAVENOUS

## 2022-07-27 MED ORDER — OXYCODONE HCL 5 MG/5ML PO SOLN: 5.0000 mg | Freq: Once | ORAL | Status: DC | PRN

## 2022-07-27 MED ORDER — ONDANSETRON HCL 4 MG/2ML IJ SOLN
INTRAMUSCULAR | Status: DC | PRN
  Administered 2022-07-27: 4 mg via INTRAVENOUS

## 2022-07-27 MED ORDER — ACETAMINOPHEN 500 MG PO TABS
1000.0000 mg | ORAL_TABLET | Freq: Once | ORAL | Status: AC
  Administered 2022-07-27: 1000 mg via ORAL
  Filled 2022-07-27: qty 2

## 2022-07-27 MED ORDER — LACTATED RINGERS IV SOLN
INTRAVENOUS | Status: DC
Start: 2022-07-27 — End: 2022-07-27

## 2022-07-27 MED ORDER — ONDANSETRON HCL 4 MG/2ML IJ SOLN
INTRAMUSCULAR | Status: AC
  Filled 2022-07-27: qty 2

## 2022-07-27 MED ORDER — PHENAZOPYRIDINE HCL 200 MG PO TABS: 200.0000 mg | ORAL_TABLET | Freq: Three times a day (TID) | ORAL | 0 refills | Status: DC | PRN

## 2022-07-27 MED ORDER — AMISULPRIDE (ANTIEMETIC) 5 MG/2ML IV SOLN: 10.0000 mg | Freq: Once | INTRAVENOUS | Status: DC | PRN

## 2022-07-27 MED ORDER — OXYCODONE HCL 5 MG PO TABS: 5.0000 mg | ORAL_TABLET | Freq: Once | ORAL | Status: DC | PRN

## 2022-07-27 MED ORDER — 0.9 % SODIUM CHLORIDE (POUR BTL) OPTIME
TOPICAL | Status: DC | PRN
  Administered 2022-07-27: 1000 mL

## 2022-07-27 MED ORDER — PROPOFOL 10 MG/ML IV BOLUS
INTRAVENOUS | Status: DC | PRN
  Administered 2022-07-27: 100 mg via INTRAVENOUS

## 2022-07-27 MED ORDER — ROCURONIUM BROMIDE 10 MG/ML (PF) SYRINGE
PREFILLED_SYRINGE | INTRAVENOUS | Status: AC
  Filled 2022-07-27: qty 10

## 2022-07-27 MED ORDER — LIDOCAINE HCL (PF) 2 % IJ SOLN
INTRAMUSCULAR | Status: AC
  Filled 2022-07-27: qty 5

## 2022-07-27 MED ORDER — STERILE WATER FOR IRRIGATION IR SOLN
Status: DC | PRN
  Administered 2022-07-27: 12000 mL

## 2022-07-27 MED ORDER — CHLORHEXIDINE GLUCONATE 0.12 % MT SOLN
15.0000 mL | Freq: Once | OROMUCOSAL | Status: AC
  Administered 2022-07-27: 15 mL via OROMUCOSAL

## 2022-07-27 MED ORDER — ORAL CARE MOUTH RINSE: 15.0000 mL | Freq: Once | OROMUCOSAL | Status: AC

## 2022-07-27 MED ORDER — ONDANSETRON HCL 4 MG/2ML IJ SOLN: 4.0000 mg | Freq: Once | INTRAMUSCULAR | Status: DC | PRN

## 2022-07-27 MED ORDER — IOHEXOL 300 MG/ML  SOLN
INTRAMUSCULAR | Status: DC | PRN
  Administered 2022-07-27: 9 mL

## 2022-07-27 MED ORDER — BUPIVACAINE HCL 0.25 % IJ SOLN
INTRAMUSCULAR | Status: AC
  Filled 2022-07-27: qty 1

## 2022-07-27 MED ORDER — HYDROMORPHONE HCL 1 MG/ML IJ SOLN: 0.2500 mg | INTRAMUSCULAR | Status: DC | PRN

## 2022-07-27 MED ORDER — DEXAMETHASONE SODIUM PHOSPHATE 10 MG/ML IJ SOLN
INTRAMUSCULAR | Status: DC | PRN
  Administered 2022-07-27: 4 mg via INTRAVENOUS

## 2022-07-27 SURGICAL SUPPLY — 47 items
BAG COUNTER SPONGE SURGICOUNT (BAG) IMPLANT
BAG URINE DRAIN 2000ML AR STRL (UROLOGICAL SUPPLIES) IMPLANT
BAG URO CATCHER STRL LF (MISCELLANEOUS) ×1 IMPLANT
BNDG GAUZE DERMACEA FLUFF 4 (GAUZE/BANDAGES/DRESSINGS) ×1 IMPLANT
BRIEF MESH DISP LRG (UNDERPADS AND DIAPERS) IMPLANT
COVER MAYO STAND STRL (DRAPES) IMPLANT
COVER SURGICAL LIGHT HANDLE (MISCELLANEOUS) ×1 IMPLANT
DERMABOND IMPLANT
DERMABOND ADVANCED .7 DNX12 (GAUZE/BANDAGES/DRESSINGS) IMPLANT
DRAIN PENROSE 0.25X18 (DRAIN) IMPLANT
DRAPE FOOT SWITCH (DRAPES) ×1 IMPLANT
DRAPE LAPAROTOMY T 98X78 PEDS (DRAPES) ×1 IMPLANT
ELECT NDL TIP 2.8 STRL (NEEDLE) ×1 IMPLANT
ELECT NEEDLE TIP 2.8 STRL (NEEDLE) ×1 IMPLANT
ELECT PENCIL ROCKER SW 15FT (MISCELLANEOUS) IMPLANT
ELECT REM PT RETURN 15FT ADLT (MISCELLANEOUS) ×1 IMPLANT
GLOVE SURG LX STRL 7.5 STRW (GLOVE) ×1 IMPLANT
GOWN STRL REUS W/ TWL XL LVL3 (GOWN DISPOSABLE) ×1 IMPLANT
GOWN STRL REUS W/TWL XL LVL3 (GOWN DISPOSABLE) ×1
GUIDEWIRE STR DUAL SENSOR (WIRE) IMPLANT
KIT BASIN OR (CUSTOM PROCEDURE TRAY) ×1 IMPLANT
KIT TURNOVER KIT A (KITS) IMPLANT
LOOP CUT BIPOLAR 24F LRG (ELECTROSURGICAL) IMPLANT
LOOP MONOPOLAR YLW (ELECTROSURGICAL) IMPLANT
MANIFOLD NEPTUNE II (INSTRUMENTS) ×1 IMPLANT
NEEDLE HYPO 22GX1.5 SAFETY (NEEDLE) IMPLANT
NS IRRIG 1000ML POUR BTL (IV SOLUTION) ×1 IMPLANT
PACK CYSTO (CUSTOM PROCEDURE TRAY) ×1 IMPLANT
PACK GENERAL/GYN (CUSTOM PROCEDURE TRAY) ×1 IMPLANT
PENCIL SMOKE EVACUATOR (MISCELLANEOUS) IMPLANT
SPIKE FLUID TRANSFER (MISCELLANEOUS) ×1 IMPLANT
SUPPORT SCROTAL LG STRP (MISCELLANEOUS) ×1 IMPLANT
SUT CHROMIC 3 0 SH 27 (SUTURE) ×2 IMPLANT
SUT MNCRL AB 4-0 PS2 18 (SUTURE) IMPLANT
SUT PROLENE 4 0 RB 1 (SUTURE)
SUT PROLENE 4-0 RB1 .5 CRCL 36 (SUTURE) IMPLANT
SUT VIC AB 2-0 UR5 27 (SUTURE) IMPLANT
SUT VICRYL 0 TIES 12 18 (SUTURE) IMPLANT
SYR CONTROL 10ML LL (SYRINGE) IMPLANT
SYR TOOMEY IRRIG 70ML (MISCELLANEOUS)
SYRINGE TOOMEY IRRIG 70ML (MISCELLANEOUS) IMPLANT
TOWEL OR 17X26 10 PK STRL BLUE (TOWEL DISPOSABLE) ×2 IMPLANT
TOWEL OR NON WOVEN STRL DISP B (DISPOSABLE) ×1 IMPLANT
TUBING CONNECTING 10 (TUBING) ×1 IMPLANT
TUBING UROLOGY SET (TUBING) ×1 IMPLANT
WATER STERILE IRR 1000ML POUR (IV SOLUTION) ×1 IMPLANT
YANKAUER SUCT BULB TIP 10FT TU (MISCELLANEOUS) IMPLANT

## 2022-07-27 NOTE — Op Note (Signed)
Preoperative diagnosis: 1.  Left hydrocele 2.  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  Left hydrocele 2.  Urothelial carcinoma the bladder with hematuria  Procedures: 1.  Repair of left hydrocele 2.  Cystoscopy 3.  Left retrograde pyelography with interpretation 4.  Site selected bladder biopsies 5.  Fulguration of bladder  Surgeon: [name] MD  Anesthesia: General  Complications: None  EBL: 25 cc  Specimens: 1.  Left hydrocele sac 2.  Left testicular tunica biopsy 3.  Anterior bladder biopsy 4.  Right lateral bladder biopsy 5.  Right trigonal bladder biopsy  Disposition of specimens: Pathology  Intraoperative findings: Left retrograde pyelography was performed the 6 French ureteral catheter and Omnipaque contrast.  This demonstrated a normal caliber ureter without intraureteral filling defects.  No hydronephrosis noted.  No renal collecting system filling defects.  On inspection of the left testis, there was noted to be a gold colored, irregular rind on portions of the left testicular tunica.  This appeared to be consistent with probable chronic inflammation with no firm mass to suggest an underlying testicular tumor.  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  He recently presented with recurrent hematuria and suspicious urinary cytologies.  In addition, he has a symptomatic left hydrocele.  He therefore presented for the above procedures.  The potential risks, complications, and expected recovery process was discussed in detail.  Informed consent was obtained.  Description of procedure: The patient was taken the operating room and a general anesthetic was administered.  He was given preoperative  antibiotics, placed in the dorsolithotomy position, and prepped and draped in the usual sterile fashion.  Next, a preoperative timeout was performed.  Attention turned to the scrotum and an incision was made in the median raphae.  This was carried down through the subcutaneous dartos tissue allowing the tunica vaginalis to be identified.  This was carefully separated from the surrounding dartos tissue allowing the left hydrocele to be delivered.  This was opened with approximately 500 cc of serous appearing fluid returned.  A portion of the left hydrocele sac was then removed and sent for specimen.  The testicle and spermatic cord were then examined.  There did appear to be a gold colored, or regular rind around portions of the testicular tunica very superficially.  Samples of this were biopsied although this was most consistent with chronic inflammatory change rather than malignancy.  The hydrocele sac was then sewn back on itself behind the testicle in a Jaboulay fashion.  This was performed with a 3-0 chromic suture.  Meticulous hemostasis was then achieved with cautery.  1/4 inch Penrose drain was placed through a separate incision in the left hemiscrotum and sewn in place with a 3-0 nylon suture.  The dartos fascia was then closed with a running 3-0 chromic suture.  Skin was reapproximated with a running 4-0 Monocryl vertical mattress suture.  Dermabond was applied to the skin.  Attention then turned to the cystoscopic portion of the procedure.  A 22 French cystoscope was used to examine the urethra which appeared normal.  Inspection of bladder revealed multiple bleeding sites without obvious tumor.  The left ureteral orifice was initially difficult to identify but was eventually identified and a slightly aberrant position likely related to  his prior bladder surgery.  Intravenous fluorescein was administered to help identify the ureteral orifice.  At this point, a 6 [place] ureteral catheter was used to  intubate the left ureteral orifice and a retrograde pyelogram was performed.  Findings are described above and indicated no significant concern for pathology.  Attention then returned to the bladder.  There did appear to be hyperemic and edematous areas at the right trigone, right lateral bladder, and anterior bladder.  Each of these areas was biopsied with a cold cup biopsy forceps.  At this point, it was felt that hemostasis would be best achieved with loop cautery.  The urethra was dilated serially up to 30 [place] with [employer] sounds and the 28 French resectoscope sheath was inserted.  Using a monopolar loop, the aforementioned biopsy sites were cauterized along with surrounding areas that appeared somewhat hyperemic and with high potential to bleed in the future.  The bladder was emptied and reinspected and there appeared to be excellent hemostasis.  The patient tolerated the procedure well without complications.  He was able to be extubated and transferred to the recovery unit in satisfactory condition.

## 2022-07-27 NOTE — Anesthesia Procedure Notes (Signed)
Procedure Name: Intubation Date/Time: 07/27/2022 12:03 PM  Performed by: Niel Hummer, CRNAPre-anesthesia Checklist: Patient identified, Emergency Drugs available, Suction available and Patient being monitored Patient Re-evaluated:Patient Re-evaluated prior to induction Oxygen Delivery Method: Circle system utilized Preoxygenation: Pre-oxygenation with 100% oxygen Induction Type: IV induction Ventilation: Mask ventilation without difficulty and Oral airway inserted - appropriate to patient size Laryngoscope Size: Mac and 4 Grade View: Grade I Tube type: Oral Tube size: 7.0 mm Number of attempts: 1 Airway Equipment and Method: Stylet Placement Confirmation: ETT inserted through vocal cords under direct vision, positive ETCO2 and breath sounds checked- equal and bilateral Secured at: 23 cm Tube secured with: Tape Dental Injury: Teeth and Oropharynx as per pre-operative assessment

## 2022-07-27 NOTE — Discharge Instructions (Signed)
You may see some blood in the urine and may have some burning with urination for 48-72 hours. You also may notice that you have to urinate more frequently or urgently after your procedure which is normal.  You should call should you develop an inability urinate, fever > 101, persistent nausea and vomiting that prevents you from eating or drinking to stay hydrated.  Keep scrotal fluff dressing in place for approximately 48 hours.  The gauze can be changed as needed.  Drainage as expected. Use an ice pack to the scrotum intermittently to help reduce swelling. You may use your pain medication as needed.  If you do not need the prescription pain medication, Tylenol is okay to take.  Avoid ibuprofen, Aleve, or other nonsteroidal anti-inflammatory drugs. Stay off your Eliquis for 1 week.

## 2022-07-27 NOTE — Anesthesia Postprocedure Evaluation (Signed)
Anesthesia Post Note  Patient: [name]  Procedure(s) Performed: HYDROCELE REPAIR (Left) CYSTOSCOPY WITH  BLADDER BIOPSY, TRANSURETHRAL FULGERATION OF BLADDER, EXAM UNDER ANESTHESIA, LEFT RETROGRADE PYELOGRAM (Left)     Patient location during evaluation: PACU Anesthesia Type: General Level of consciousness: awake and alert, oriented and patient cooperative Pain management: pain level controlled Vital Signs Assessment: post-procedure vital signs reviewed and stable Respiratory status: spontaneous breathing, nonlabored ventilation and respiratory function stable Cardiovascular status: blood pressure returned to baseline and stable Postop Assessment: no apparent nausea or vomiting Anesthetic complications: no   No notable events documented.  Last Vitals:  Vitals:   07/27/22 1430 07/27/22 1445  BP: (!) 136/97 138/88  Pulse: 65 66  Resp: 20 13  Temp:  (!) 36.3 C  SpO2: 94% 92%    Last Pain:  Vitals:   07/27/22 1445  TempSrc:   PainSc: 0-No pain                 Adam Harris

## 2022-07-27 NOTE — Anesthesia Preprocedure Evaluation (Addendum)
Anesthesia Evaluation  Patient identified by MRN, date of birth, ID band Patient awake    Reviewed: Allergy & Precautions, NPO status , Patient's Chart, lab work & pertinent test results, reviewed documented beta blocker date and time   Airway Mallampati: III  TM Distance: >3 FB Neck ROM: Full    Dental  (+) Edentulous Lower, Edentulous Upper   Pulmonary former smoker,  Quit smoking 1972   Pulmonary exam normal breath sounds clear to auscultation       Cardiovascular hypertension, Pt. on medications and Pt. on home beta blockers pulmonary hypertension (mild pHTN)+ CAD, + Past MI, +CHF (normal LVEF on last echo) and + DOE  Normal cardiovascular exam+ dysrhythmias (eliquis LD 9/12) Atrial Fibrillation + Valvular Problems/Murmurs (mild MR) MR  Rhythm:Regular Rate:Normal  Echo 05/2022 1. Left ventricular ejection fraction, by estimation, is 60 to 65%. The  left ventricle has normal function. The left ventricle has no regional  wall motion abnormalities. There is moderate left ventricular hypertrophy  of the basal-septal segment. Left  ventricular diastolic parameters are indeterminate.  2. Right ventricular systolic function is normal. The right ventricular  size is normal. There is mildly elevated pulmonary artery systolic  pressure.  3. Left atrial size was mildly dilated.  4. The mitral valve is normal in structure. Mild mitral valve  regurgitation. No evidence of mitral stenosis.  5. The aortic valve is tricuspid. There is mild calcification of the  aortic valve. Aortic valve regurgitation is not visualized. Aortic valve  sclerosis is present, with no evidence of aortic valve stenosis.  6. The inferior vena cava is normal in size with greater than 50%  respiratory variability, suggesting right atrial pressure of 3 mmHg.    Neuro/Psych PSYCHIATRIC DISORDERS Anxiety negative neurological ROS     GI/Hepatic negative GI  ROS, Neg liver ROS,   Endo/Other  diabetes, Well Controlled, Type 2, Oral Hypoglycemic Agentsa1c 7.2  Renal/GU CRF and Renal InsufficiencyRenal diseaseCr 2.15 Bladder dysfunction  Bladder ca    Musculoskeletal  (+) Arthritis , Osteoarthritis,    Abdominal   Peds  Hematology negative hematology ROS (+)   Anesthesia Other Findings   Reproductive/Obstetrics negative OB ROS                            Anesthesia Physical Anesthesia Plan  ASA: 3  Anesthesia Plan: General   Post-op Pain Management: Tylenol PO (pre-op)*   Induction: Intravenous  PONV Risk Score and Plan: 3 and Ondansetron, Dexamethasone and Treatment may vary due to age or medical condition  Airway Management Planned: Oral ETT  Additional Equipment: None  Intra-op Plan:   Post-operative Plan: Extubation in OR  Informed Consent:   Plan Discussed with:   Anesthesia Plan Comments:        Anesthesia Quick Evaluation

## 2022-07-27 NOTE — Transfer of Care (Signed)
Immediate Anesthesia Transfer of Care Note  Patient: Adam Harris  Procedure(s) Performed: HYDROCELE REPAIR (Left) CYSTOSCOPY WITH  BLADDER BIOPSY, TRANSURETHRAL FULGERATION OF BLADDER, EXAM UNDER ANESTHESIA, LEFT RETROGRADE PYELOGRAM (Left)  Patient Location: PACU  Anesthesia Type:General  Level of Consciousness: drowsy  Airway & Oxygen Therapy: Patient Spontanous Breathing and Patient connected to face mask oxygen  Post-op Assessment: Report given to RN, Post -op Vital signs reviewed and stable and Patient moving all extremities X 4  Post vital signs: Reviewed and stable  Last Vitals:  Vitals Value Taken Time  BP 129/81   Temp    Pulse 56 07/27/22 1358  Resp 14 07/27/22 1358  SpO2 93 % 07/27/22 1358  Vitals shown include unvalidated device data.  Last Pain:  Vitals:   07/27/22 1036  TempSrc:   PainSc: 0-No pain         Complications: No notable events documented.

## 2022-07-28 ENCOUNTER — Encounter (HOSPITAL_COMMUNITY): Payer: Self-pay | Admitting: Urology

## 2022-07-28 LAB — SURGICAL PATHOLOGY

## 2022-07-31 DIAGNOSIS — D631 Anemia in chronic kidney disease: Secondary | ICD-10-CM | POA: Diagnosis not present

## 2022-07-31 DIAGNOSIS — E1122 Type 2 diabetes mellitus with diabetic chronic kidney disease: Secondary | ICD-10-CM | POA: Diagnosis not present

## 2022-07-31 DIAGNOSIS — I2510 Atherosclerotic heart disease of native coronary artery without angina pectoris: Secondary | ICD-10-CM | POA: Diagnosis not present

## 2022-07-31 DIAGNOSIS — I129 Hypertensive chronic kidney disease with stage 1 through stage 4 chronic kidney disease, or unspecified chronic kidney disease: Secondary | ICD-10-CM | POA: Diagnosis not present

## 2022-07-31 DIAGNOSIS — R809 Proteinuria, unspecified: Secondary | ICD-10-CM | POA: Diagnosis not present

## 2022-07-31 DIAGNOSIS — N400 Enlarged prostate without lower urinary tract symptoms: Secondary | ICD-10-CM | POA: Diagnosis not present

## 2022-07-31 DIAGNOSIS — N1832 Chronic kidney disease, stage 3b: Secondary | ICD-10-CM | POA: Diagnosis not present

## 2022-07-31 DIAGNOSIS — Z905 Acquired absence of kidney: Secondary | ICD-10-CM | POA: Diagnosis not present

## 2022-07-31 DIAGNOSIS — N390 Urinary tract infection, site not specified: Secondary | ICD-10-CM | POA: Diagnosis not present

## 2022-07-31 DIAGNOSIS — R609 Edema, unspecified: Secondary | ICD-10-CM | POA: Diagnosis not present

## 2022-08-01 DIAGNOSIS — C678 Malignant neoplasm of overlapping sites of bladder: Secondary | ICD-10-CM | POA: Diagnosis not present

## 2022-08-01 DIAGNOSIS — R3121 Asymptomatic microscopic hematuria: Secondary | ICD-10-CM | POA: Diagnosis not present

## 2022-08-02 ENCOUNTER — Ambulatory Visit: Payer: Medicare PPO | Admitting: Family Medicine

## 2022-08-02 ENCOUNTER — Telehealth: Payer: Self-pay | Admitting: Family Medicine

## 2022-08-02 NOTE — Telephone Encounter (Signed)
Pt was a no show 9/20 for an OV with Dr. Gena Fray, this is his first. Letter has been sent out

## 2022-08-07 ENCOUNTER — Telehealth: Payer: Self-pay | Admitting: Family Medicine

## 2022-08-07 NOTE — Telephone Encounter (Signed)
1st no show, fee waived, letter sent 

## 2022-08-07 NOTE — Telephone Encounter (Signed)
Pt said he call 24 hours before his appt to cancel  his appointment because he was in the hospital . Pt stated he received a letter saing sorry he missed his appointment

## 2022-08-16 DIAGNOSIS — D090 Carcinoma in situ of bladder: Secondary | ICD-10-CM | POA: Diagnosis not present

## 2022-08-16 DIAGNOSIS — N430 Encysted hydrocele: Secondary | ICD-10-CM | POA: Diagnosis not present

## 2022-08-18 ENCOUNTER — Ambulatory Visit: Payer: Medicare PPO | Admitting: Nurse Practitioner

## 2022-08-18 NOTE — Progress Notes (Deleted)
Office Visit    Patient Name: Adam Harris Date of Encounter: 08/18/2022  Primary Care Provider:  Haydee Salter, MD Primary Cardiologist:  Minus Breeding, MD  Chief Complaint    76 year old male with a history of permanent atrial fibrillation, CAD, chronic diastolic heart failure, hypertension, hyperlipidemia, type 2 diabetes, and CKD stage IV who presents for follow-up related to CAD, atrial fibrillation, and heart failure.  Past Medical History    Past Medical History:  Diagnosis Date   Abnormal radiologic findings on diagnostic imaging of renal pelvis, ureter, or bladder    bilateral ureter abnormalities   Anticoagulant long-term use    eliquis   Anxiety    Arthritis    CAD (coronary artery disease) cardiologist-- dr hochrein   NSTEMI 02-04-2014  per cardiac cath chronic occluded RCA w/ faint left-to-right collaterals and aneurysmal LCFx with sluggish coronary flow/   NSTEMI --11-21-2016 per cardiac cath occluded proximal RCA & mid to diastal CFX 100%, med rx. If that does not work, PTCA or CABG   Cancer [facility])    bladder and kidney   CKD (chronic kidney disease), stage III ([place])    patient unaware   DOE (dyspnea on exertion)    2-3 flights of stairs   Fatty liver    pt denies   Hematuria 02/2019   History of COVID-19 10/2019   History of non-ST elevation myocardial infarction (NSTEMI)    02-04-2014  and 11-21-2016  cardiac cath done both times ,  medically management   History of shingles 12/2017   slight pain and numbness still noted in the area   Hyperlipidemia    Hypertension    Insomnia    Myocardial infarction [facility]) 2015   Persistent atrial fibrillation [facility])    cardiologsit-- dr hochrein   Thoracic aortic atherosclerosis ([place])    Type 2 diabetes mellitus ([place])    Urinary frequency    Past Surgical History:  Procedure Laterality Date   CARDIAC CATHETERIZATION N/A 11/21/2016   Procedure: Left Heart Cath and Coronary Angiography;  Surgeon: Troy Sine,  MD;  Location: [place] [facility];  Service: Cardiovascular;  Laterality: N/A;  pRCA 100% , ostial LAD 45%, OM3 80%, mCFX to dCFX 100% (AV groove), lateral OM3 50%   COLONOSCOPY     CYSTOSCOPY WITH BIOPSY N/A 08/12/2020   Procedure: CYSTOSCOPY WITH BLADDER BIOPSY AND TRANSURETHRAL RESECTION OF BLADDER TUMOR;  Surgeon: [name], MD;  [facility];  Service: Urology;  Laterality: N/A;   CYSTOSCOPY WITH BIOPSY N/A 11/10/2021   Procedure: CYSTOSCOPY WITH BLADDER BIOPSIES/ LEFT RETROGRADE;  Surgeon: [name], MD;  [facility];  Service: Urology;  Laterality: N/A;   CYSTOSCOPY WITH BIOPSY Left 07/27/2022   Procedure: CYSTOSCOPY WITH  BLADDER BIOPSY, TRANSURETHRAL FULGERATION OF BLADDER, EXAM UNDER ANESTHESIA, LEFT RETROGRADE PYELOGRAM;  Surgeon: [name], MD;  [facility];  Service: Urology;  Laterality: Left;   CYSTOSCOPY WITH URETEROSCOPY AND STENT PLACEMENT Right 12/15/2019   Procedure: CYSTOSCOPY WITH RIGHT RETROGRADE/ RIGHT URETEROSCOPY/ BIOPSY;  Surgeon: [name], MD;  Location: [place];  Service: Urology;  Laterality: Right;   CYSTOSCOPY/RETROGRADE/URETEROSCOPY Bilateral 03/18/2018   Procedure: CYSTOSCOPY/RETROGRADE/URETEROSCOPY.;  Surgeon: [name], MD;  Location: [facility];  Service: Urology;  Laterality: Bilateral;   EYE SURGERY Bilateral    cateract in January 2023   HYDROCELE EXCISION Left 07/27/2022   Procedure: HYDROCELE REPAIR;  Surgeon: [name], MD;  [facility];  Service: Urology;  Laterality: Left;  GENERAL ANESTHESIA WITH PARALYSIS   KNEE ARTHROSCOPY Right    LEFT HEART CATHETERIZATION WITH CORONARY ANGIOGRAM N/A 02/04/2014   Procedure: LEFT HEART CATHETERIZATION WITH CORONARY ANGIOGRAM;  Surgeon: [name], MD;  Location: [place] [facility];  Service: Cardiovascular;  Laterality: N/A;  severe one-vessel CAD, chronically occluded RCA with faint left-to-right collaterals;  aneurysmal LCFx with  sluggish coronary flow;  normal LVSF w/ moderately elevated LVEDP (ostialOM2 20%, pOM3 20%, pD1 20%, mCFX 50%, diffuse 20% pCFX)   PROSTATE BIOPSY N/A 08/12/2020   Procedure: BIOPSY TRANSRECTAL ULTRASONIC PROSTATE (TUBP);  Surgeon: [name], MD;  [facility];  Service: Urology;  Laterality: N/A;   ROBOT ASSITED LAPAROSCOPIC NEPHROURETERECTOMY Right 03/22/2020   Procedure: XI ROBOT ASSITED LAPAROSCOPIC NEPHROURETERECTOMY;  Surgeon: [name], MD;  [facility];  Service: Urology;  Laterality: Right;   TRANSTHORACIC ECHOCARDIOGRAM  11-22-2016   dr hochrein   ef 55-60%/ mild MR and TR/ moderate LAE   TRANSURETHRAL RESECTION OF BLADDER TUMOR N/A 02/10/2021   Procedure: TRANSURETHRAL RESECTION OF BLADDER TUMOR (TURBT)/ CYSTOSCOPY/ LEFT RETROGRADE;  Surgeon: [name], MD;  [facility];  Service: Urology;  Laterality: N/A;  GENERAL ANESTHESIA WITH PARALYSIS    Allergies  Allergies  Allergen Reactions   Xarelto [Rivaroxaban] Other (See Comments)    Skin Blisters     History of Present Illness    76 year old male with the above past medical history including permanent atrial fibrillation, CAD, chronic diastolic heart failure, hypertension, hyperlipidemia, type 2 diabetes, and CKD stage IV.  He has a history of permanent atrial fibrillation on Eliquis.  Generally, he has a history of multivessel CAD.  Cardiac catheterization in January 2018 showed CTO of proximal RCA (with collaterals, unchanged from prior cath in 2015), 45% oLAD, 80% OM3, 100% m-d LCx (aneurysmal, not amenable to PCI), and 50% lateral OM3 stenoses.  Medical therapy was recommended.  Seen in the office on 05/18/2022 and was stable overall from a cardiac standpoint.  He did note some increased bilateral lower extremity edema.  Weight was stable overall.  Reports some dietary indiscretion.  His Lasix was increased to 40 mg daily X3 days. Most recent echocardiogram in 05/2022 showed EF 60 to 65%, normal LV  function, no RWMA, moderate LVH, indeterminate diastolic parameters, normal RV systolic function, mildly elevated PASP, mild mitral valve regurgitation.  He was seen virtually on 07/21/2022 for preoperative cardiac evaluation and was stable from a cardiac standpoint.  He presents today for follow-up.  Since his last visit  Permanent atrial fibrillation: CAD: Chronic diastolic heart failure: Hypertension: Hyperlipidemia: Type 2 diabetes: CKD stage IV: Disposition:  Home Medications    Current Outpatient Medications  Medication Sig Dispense Refill   amLODipine (NORVASC) 5 MG tablet Take 1 tablet (5 mg total) by mouth daily. 90 tablet 3   cefpodoxime (VANTIN) 200 MG tablet Take 200 mg by mouth 2 (two) times daily.     diphenhydramine-acetaminophen (TYLENOL PM) 25-500 MG TABS tablet Take 1 tablet by mouth at bedtime as needed (pain/sleep).     finasteride (PROSCAR) 5 MG tablet Take 5 mg by mouth daily.     furosemide (LASIX) 20 MG tablet TAKE ONE TABLET BY MOUTH DAILY . MAY TAKE EXTRA TABLET IF NEEDED FOR SWELLING (Patient not taking: Reported on 07/20/2022) 45 tablet 4   furosemide (LASIX) 40 MG tablet Take 40 mg by mouth daily as needed for fluid.     HYDROcodone-acetaminophen (NORCO/VICODIN) 5-325 MG tablet Take 1-2 tablets by mouth every 6 (six)  hours as needed. 20 tablet 0   isosorbide mononitrate (IMDUR) 30 MG 24 hr tablet Take 1 tablet (30 mg total) by mouth daily. 90 tablet 1   metoprolol tartrate (LOPRESSOR) 25 MG tablet TAKE ONE TABLET BY MOUTH TWICE A DAY 180 tablet 0   nitroGLYCERIN (NITROSTAT) 0.4 MG SL tablet PLACE ONE TABLET UNDER THE TONGUE EVERY 5 MINUTES TIMES THREE DOSES AS NEEDED FOR CHEST PAIN. CALL 911 IF 2ND DOSE DOES NOT HELP 25 tablet 5   olmesartan (BENICAR) 20 MG tablet TAKE ONE TABLET BY MOUTH DAILY 90 tablet 0   phenazopyridine (PYRIDIUM) 200 MG tablet Take 1 tablet (200 mg total) by mouth 3 (three) times daily as needed for pain. 20 tablet 0   rosuvastatin (CRESTOR)  40 MG tablet Take 1 tablet (40 mg total) by mouth every morning. 30 tablet 6   sitaGLIPtin (JANUVIA) 25 MG tablet Take 1 tablet (25 mg total) by mouth daily. 90 tablet 3   No current facility-administered medications for this visit.     Review of Systems    ***.  All other systems reviewed and are otherwise negative except as noted above.    Physical Exam    VS:  There were no vitals taken for this visit. , BMI There is no height or weight on file to calculate BMI.     GEN: Well nourished, well developed, in no acute distress. HEENT: normal. Neck: Supple, no JVD, carotid bruits, or masses. Cardiac: RRR, no murmurs, rubs, or gallops. No clubbing, cyanosis, edema.  Radials/DP/PT 2+ and equal bilaterally.  Respiratory:  Respirations regular and unlabored, clear to auscultation bilaterally. GI: Soft, nontender, nondistended, BS + x 4. MS: no deformity or atrophy. Skin: warm and dry, no rash. Neuro:  Strength and sensation are intact. Psych: Normal affect.  Accessory Clinical Findings    ECG personally reviewed by me today - *** - no acute changes.   Lab Results  Component Value Date   WBC 9.8 07/25/2022   HGB 14.0 07/25/2022   HCT 43.3 07/25/2022   MCV 88.9 07/25/2022   PLT 157 07/25/2022   Lab Results  Component Value Date   CREATININE 2.15 (H) 07/25/2022   BUN 37 (H) 07/25/2022   NA 140 07/25/2022   K 4.3 07/25/2022   CL 112 (H) 07/25/2022   CO2 21 (L) 07/25/2022   Lab Results  Component Value Date   ALT 14 07/25/2022   AST 21 07/25/2022   ALKPHOS 75 07/25/2022   BILITOT 1.9 (H) 07/25/2022   Lab Results  Component Value Date   CHOL 100 05/02/2022   HDL 35.80 (L) 05/02/2022   LDLCALC 54 05/02/2022   TRIG 51.0 05/02/2022   CHOLHDL 3 05/02/2022    Lab Results  Component Value Date   HGBA1C 7.2 (H) 07/25/2022    Assessment & Plan    1.  ***  No BP recorded.  {Refresh Note OR Click here to enter BP  :1}***   [name], NP 08/18/2022, 4:48 AM

## 2022-08-23 ENCOUNTER — Other Ambulatory Visit: Payer: Self-pay | Admitting: Cardiology

## 2022-08-23 DIAGNOSIS — E785 Hyperlipidemia, unspecified: Secondary | ICD-10-CM

## 2022-08-24 NOTE — Progress Notes (Signed)
Office Visit    Patient Name: Adam Harris Date of Encounter: 08/25/2022  Primary Care Provider:  Haydee Salter, MD Primary Cardiologist:  Minus Breeding, MD  Chief Complaint   76 year old male with a history of permanent atrial fibrillation, CAD, chronic diastolic heart failure, hypertension, hyperlipidemia, type 2 diabetes, and CKD stage IV who presents for follow-up related to heart failure and atrial fibrillation.  Past Medical History    Past Medical History:  Diagnosis Date   Abnormal radiologic findings on diagnostic imaging of renal pelvis, ureter, or bladder    bilateral ureter abnormalities   Anticoagulant long-term use    eliquis   Anxiety    Arthritis    CAD (coronary artery disease) cardiologist-- dr hochrein   NSTEMI 02-04-2014  per cardiac cath chronic occluded RCA w/ faint left-to-right collaterals and aneurysmal LCFx with sluggish coronary flow/   NSTEMI --11-21-2016 per cardiac cath occluded proximal RCA & mid to diastal CFX 100%, med rx. If that does not work, PTCA or CABG   Cancer [facility])    bladder and kidney   CKD (chronic kidney disease), stage III ([place])    patient unaware   DOE (dyspnea on exertion)    2-3 flights of stairs   Fatty liver    pt denies   Hematuria 02/2019   History of COVID-19 10/2019   History of non-ST elevation myocardial infarction (NSTEMI)    02-04-2014  and 11-21-2016  cardiac cath done both times ,  medically management   History of shingles 12/2017   slight pain and numbness still noted in the area   Hyperlipidemia    Hypertension    Insomnia    Myocardial infarction [facility]) 2015   Persistent atrial fibrillation [facility])    cardiologsit-- dr hochrein   Thoracic aortic atherosclerosis ([place])    Type 2 diabetes mellitus ([place])    Urinary frequency    Past Surgical History:  Procedure Laterality Date   CARDIAC CATHETERIZATION N/A 11/21/2016   Procedure: Left Heart Cath and Coronary Angiography;  Surgeon: Troy Sine, MD;   Location: [place] [facility];  Service: Cardiovascular;  Laterality: N/A;  pRCA 100% , ostial LAD 45%, OM3 80%, mCFX to dCFX 100% (AV groove), lateral OM3 50%   COLONOSCOPY     CYSTOSCOPY WITH BIOPSY N/A 08/12/2020   Procedure: CYSTOSCOPY WITH BLADDER BIOPSY AND TRANSURETHRAL RESECTION OF BLADDER TUMOR;  Surgeon: [name], MD;  [facility];  Service: Urology;  Laterality: N/A;   CYSTOSCOPY WITH BIOPSY N/A 11/10/2021   Procedure: CYSTOSCOPY WITH BLADDER BIOPSIES/ LEFT RETROGRADE;  Surgeon: [name], MD;  [facility];  Service: Urology;  Laterality: N/A;   CYSTOSCOPY WITH BIOPSY Left 07/27/2022   Procedure: CYSTOSCOPY WITH  BLADDER BIOPSY, TRANSURETHRAL FULGERATION OF BLADDER, EXAM UNDER ANESTHESIA, LEFT RETROGRADE PYELOGRAM;  Surgeon: [name], MD;  [facility];  Service: Urology;  Laterality: Left;   CYSTOSCOPY WITH URETEROSCOPY AND STENT PLACEMENT Right 12/15/2019   Procedure: CYSTOSCOPY WITH RIGHT RETROGRADE/ RIGHT URETEROSCOPY/ BIOPSY;  Surgeon: [name], MD;  Location: [place];  Service: Urology;  Laterality: Right;   CYSTOSCOPY/RETROGRADE/URETEROSCOPY Bilateral 03/18/2018   Procedure: CYSTOSCOPY/RETROGRADE/URETEROSCOPY.;  Surgeon: [name], MD;  Location: [facility];  Service: Urology;  Laterality: Bilateral;   EYE SURGERY Bilateral    cateract in January 2023   HYDROCELE EXCISION Left 07/27/2022   Procedure: HYDROCELE REPAIR;  Surgeon: [name], MD;  [facility];  Service: Urology;  Laterality: Left;  GENERAL ANESTHESIA  WITH PARALYSIS   KNEE ARTHROSCOPY Right    LEFT HEART CATHETERIZATION WITH CORONARY ANGIOGRAM N/A 02/04/2014   Procedure: LEFT HEART CATHETERIZATION WITH CORONARY ANGIOGRAM;  Surgeon: [name], MD;  Location: [place] [facility];  Service: Cardiovascular;  Laterality: N/A;  severe one-vessel CAD, chronically occluded RCA with faint left-to-right collaterals;  aneurysmal LCFx with sluggish  coronary flow;  normal LVSF w/ moderately elevated LVEDP (ostialOM2 20%, pOM3 20%, pD1 20%, mCFX 50%, diffuse 20% pCFX)   PROSTATE BIOPSY N/A 08/12/2020   Procedure: BIOPSY TRANSRECTAL ULTRASONIC PROSTATE (TUBP);  Surgeon: [name], MD;  [facility];  Service: Urology;  Laterality: N/A;   ROBOT ASSITED LAPAROSCOPIC NEPHROURETERECTOMY Right 03/22/2020   Procedure: XI ROBOT ASSITED LAPAROSCOPIC NEPHROURETERECTOMY;  Surgeon: [name], MD;  [facility];  Service: Urology;  Laterality: Right;   TRANSTHORACIC ECHOCARDIOGRAM  11-22-2016   dr hochrein   ef 55-60%/ mild MR and TR/ moderate LAE   TRANSURETHRAL RESECTION OF BLADDER TUMOR N/A 02/10/2021   Procedure: TRANSURETHRAL RESECTION OF BLADDER TUMOR (TURBT)/ CYSTOSCOPY/ LEFT RETROGRADE;  Surgeon: [name], MD;  [facility];  Service: Urology;  Laterality: N/A;  GENERAL ANESTHESIA WITH PARALYSIS    Allergies  Allergies  Allergen Reactions   Xarelto [Rivaroxaban] Other (See Comments)    Skin Blisters     History of Present Illness    76 year old male with the above past medical history including permanent atrial fibrillation, CAD, chronic diastolic heart failure, hypertension, hyperlipidemia, type 2 diabetes, and CKD stage IV.  He has a history of permanent atrial fibrillation on Eliquis.  Additionally, he has a history of multivessel CAD.  Cardiac catheterization in January 2018 showed CTO of proximal RCA (with collaterals, unchanged from prior cath in 2015), 45% oLAD, 80% OM3, 100% m-d LCx (aneurysmal, not amenable to PCI), and 50% lateral OM3 stenoses.  Medical therapy was recommended.  He was last seen in the office on 05/18/2022 and was stable overall from a cardiac standpoint.  He did note some increased bilateral lower extremity edema.  Weight was stable overall.  He reported some dietary indiscretion.  His Lasix was increased to 40 mg daily X3 days. Most recent echocardiogram in 05/2022 showed EF 60 to 65%,  normal LV function, no RWMA, moderate LVH, indeterminate diastolic parameters, normal RV systolic function, mildly elevated PASP, mild mitral valve regurgitation.  He was seen virtually on 07/21/2022 for preoperative cardiac evaluation and was stable from a cardiac standpoint.  He presents today for follow-up.  Since his last visit he has been stable from a cardiac standpoint.  He does have some nonpitting bilateral lower extremity edema.  He states he has not taken his Lasix in a while.  He denies any chest pain, dyspnea, palpitations, PND, orthopnea.  He did note some recent weight gain however, this has recently improved.  Other than his bilateral lower extremity edema, he denies any additional concerns today.  Home Medications    Current Outpatient Medications  Medication Sig Dispense Refill   amLODipine (NORVASC) 5 MG tablet Take 1 tablet (5 mg total) by mouth daily. 90 tablet 3   apixaban (ELIQUIS) 5 MG TABS tablet Take 5 mg by mouth 2 (two) times daily.     cefpodoxime (VANTIN) 200 MG tablet Take 200 mg by mouth 2 (two) times daily.     diphenhydramine-acetaminophen (TYLENOL PM) 25-500 MG TABS tablet Take 1 tablet by mouth at bedtime as needed (pain/sleep).     finasteride (PROSCAR) 5 MG tablet Take 5 mg  by mouth daily.     furosemide (LASIX) 20 MG tablet TAKE ONE TABLET BY MOUTH DAILY . MAY TAKE EXTRA TABLET IF NEEDED FOR SWELLING 45 tablet 4   isosorbide mononitrate (IMDUR) 30 MG 24 hr tablet Take 1 tablet (30 mg total) by mouth daily. 90 tablet 1   metoprolol tartrate (LOPRESSOR) 25 MG tablet TAKE ONE TABLET BY MOUTH TWICE A DAY 180 tablet 0   nitroGLYCERIN (NITROSTAT) 0.4 MG SL tablet PLACE ONE TABLET UNDER THE TONGUE EVERY 5 MINUTES TIMES THREE DOSES AS NEEDED FOR CHEST PAIN. CALL 911 IF 2ND DOSE DOES NOT HELP 25 tablet 5   olmesartan (BENICAR) 20 MG tablet TAKE ONE TABLET BY MOUTH DAILY 90 tablet 0   rosuvastatin (CRESTOR) 40 MG tablet Take 1 tablet (40 mg total) by mouth every morning.  Please keep scheduled appointment 90 tablet 3   sitaGLIPtin (JANUVIA) 25 MG tablet Take 1 tablet (25 mg total) by mouth daily. 90 tablet 3   furosemide (LASIX) 40 MG tablet Take 40 mg by mouth daily as needed for fluid. (Patient not taking: Reported on 08/25/2022)     HYDROcodone-acetaminophen (NORCO/VICODIN) 5-325 MG tablet Take 1-2 tablets by mouth every 6 (six) hours as needed. (Patient not taking: Reported on 08/25/2022) 20 tablet 0   phenazopyridine (PYRIDIUM) 200 MG tablet Take 1 tablet (200 mg total) by mouth 3 (three) times daily as needed for pain. (Patient not taking: Reported on 08/25/2022) 20 tablet 0   No current facility-administered medications for this visit.     Review of Systems    He denies chest pain, palpitations, dyspnea, pnd, orthopnea, n, v, dizziness, syncope, weight gain, or early satiety. All other systems reviewed and are otherwise negative except as noted above.   Physical Exam    VS:  BP 132/78 (BP Location: Left Arm, Patient Position: Sitting, Cuff Size: Normal)   Pulse 69   Ht '5\' 11"'$  (1.803 m)   Wt 221 lb 12.8 oz (100.6 kg)   SpO2 99%   BMI 30.93 kg/m   GEN: Well nourished, well developed, in no acute distress. HEENT: normal. Neck: Supple, no JVD, carotid bruits, or masses. Cardiac: RRR, no murmurs, rubs, or gallops. No clubbing, cyanosis, edema.  Radials/DP/PT 2+ and equal bilaterally.  Respiratory:  Respirations regular and unlabored, clear to auscultation bilaterally. GI: Soft, nontender, nondistended, BS + x 4. MS: no deformity or atrophy. Skin: warm and dry, no rash. Neuro:  Strength and sensation are intact. Psych: Normal affect.  Accessory Clinical Findings    ECG personally reviewed by me today -atrial fibrillation, 69 bpm.  Lab Results  Component Value Date   WBC 9.8 07/25/2022   HGB 14.0 07/25/2022   HCT 43.3 07/25/2022   MCV 88.9 07/25/2022   PLT 157 07/25/2022   Lab Results  Component Value Date   CREATININE 2.15 (H)  07/25/2022   BUN 37 (H) 07/25/2022   NA 140 07/25/2022   K 4.3 07/25/2022   CL 112 (H) 07/25/2022   CO2 21 (L) 07/25/2022   Lab Results  Component Value Date   ALT 14 07/25/2022   AST 21 07/25/2022   ALKPHOS 75 07/25/2022   BILITOT 1.9 (H) 07/25/2022   Lab Results  Component Value Date   CHOL 100 05/02/2022   HDL 35.80 (L) 05/02/2022   LDLCALC 54 05/02/2022   TRIG 51.0 05/02/2022   CHOLHDL 3 05/02/2022    Lab Results  Component Value Date   HGBA1C 7.2 (H) 07/25/2022  Assessment & Plan   1. Permanent atrial fibrillation: Rate controlled, continue Eliquis, metoprolol.  2. CAD: Cath in January 2018 showed CTO of proximal RCA (with collaterals, unchanged from prior cath in 2015), 45% oLAD, 80% OM3, 100% m-d LCx (aneurysmal, not amenable to PCI), and 50% lateral OM3 stenoses.  Medical therapy was recommended. Stable with no anginal symptoms. No indication for ischemic evaluation.  Continue amlodipine, metoprolol, Benicar, isosorbide, and Crestor.  2. Chronic diastolic heart failure: Most recent echo in 05/2022 showed EF 60 to 65%, normal LV function, no RWMA, moderate LVH, indeterminate diastolic parameters, normal RV systolic function, mildly elevated PASP, mild mitral valve regurgitation.  He does have nonpitting bilateral lower extremity edema.  He states he has not taken his Lasix in a while. Weight  is actually down since his last visit.  I encouraged him to take his Lasix as scribed (as needed for swelling, weight gain 3 pounds overnight, 5 pounds in a week).  Discussed sodium and fluid recommendations.  Continue Lasix, current medications as above.  3. Hypertension: BP well controlled. Continue current antihypertensive regimen.   4. Hyperlipidemia: LDL was 54 in 04/2022.  Continue Crestor.  5. Type 2 diabetes: A1c was 7.2 in 07/2022.  Monitored and managed per PCP.  6. CKD stage IV: Creatinine was 2.07 on 07/2022, stable.  Follows with nephrology.  7. Disposition: Follow-up  as scheduled with Dr. Percival Spanish in December 2023.     [name], NP 08/25/2022, 8:58 AM

## 2022-08-25 ENCOUNTER — Encounter: Payer: Self-pay | Admitting: Nurse Practitioner

## 2022-08-25 ENCOUNTER — Ambulatory Visit: Payer: Medicare PPO | Attending: Cardiovascular Disease | Admitting: Nurse Practitioner

## 2022-08-25 VITALS — BP 132/78 | HR 69 | Ht 71.0 in | Wt 221.8 lb

## 2022-08-25 DIAGNOSIS — I4821 Permanent atrial fibrillation: Secondary | ICD-10-CM | POA: Diagnosis not present

## 2022-08-25 DIAGNOSIS — I5032 Chronic diastolic (congestive) heart failure: Secondary | ICD-10-CM

## 2022-08-25 DIAGNOSIS — E118 Type 2 diabetes mellitus with unspecified complications: Secondary | ICD-10-CM | POA: Diagnosis not present

## 2022-08-25 DIAGNOSIS — N184 Chronic kidney disease, stage 4 (severe): Secondary | ICD-10-CM

## 2022-08-25 DIAGNOSIS — E785 Hyperlipidemia, unspecified: Secondary | ICD-10-CM

## 2022-08-25 DIAGNOSIS — I10 Essential (primary) hypertension: Secondary | ICD-10-CM

## 2022-08-25 DIAGNOSIS — I2510 Atherosclerotic heart disease of native coronary artery without angina pectoris: Secondary | ICD-10-CM

## 2022-08-25 NOTE — Patient Instructions (Signed)
Medication Instructions:  Your physician recommends that you continue on your current medications as directed. Please refer to the Current Medication list given to you today.  *If you need a refill on your cardiac medications before your next appointment, please call your pharmacy*   Lab Work: NONE ordered at this time of appointment   If you have labs (blood work) drawn today and your tests are completely normal, you will receive your results only by: [place] (if you have MyChart) OR A paper copy in the mail If you have any lab test that is abnormal or we need to change your treatment, we will call you to review the results.   Testing/Procedures: NONE ordered at this time of appointment     Follow-Up: At [facility], you and your health needs are our priority.  As part of our continuing mission to provide you with exceptional heart care, we have created designated Provider Care Teams.  These Care Teams include your primary Cardiologist (physician) and Advanced Practice Providers (APPs -  Physician Assistants and Nurse Practitioners) who all work together to provide you with the care you need, when you need it.  We recommend signing up for the patient portal called "MyChart".  Sign up information is provided on this After Visit Summary.  MyChart is used to connect with patients for Virtual Visits (Telemedicine).  Patients are able to view lab/test results, encounter notes, upcoming appointments, etc.  Non-urgent messages can be sent to your provider as well.   To learn more about what you can do with MyChart, go to [email or web address].    Your next appointment:    Keep Follow up   The format for your next appointment:   In Person  Provider:   Minus Breeding, MD     Other Instructions Heart Failure Education:  Weigh yourself EVERY morning after you go to the bathroom but before you eat or drink anything. Write this number down in a weight log/diary. If you  gain 3 pounds overnight or 5 pounds in a week, call the office. Take your medicines as prescribed. If you have concerns about your medications, please call us before you stop taking them. Eat low salt foods--Limit salt (sodium) to 2000 mg per day. This will help prevent your body from holding onto fluid. Read food labels as many processed foods have a lot of sodium, especially canned goods and prepackaged meats. If you would like some assistance choosing low sodium foods, we would be happy to set you up with a nutritionist. Limit all fluids for the day to less than 2 liters (64 ounces). Fluid includes all drinks, coffee, juice, ice chips, soup, jello, and all other liquids. Stay as active as you can everyday. Staying active will give you more energy and make your muscles stronger. Start with 5 minutes at a time and work your way up to 30 minutes a day. Break up your activities--do some in the morning and some in the afternoon. Start with 3 days per week and work your way up to 5 days as you can.  If you have chest pain, feel short of breath, dizzy, or lightheaded, STOP. If you don't feel better after a short rest, call 911. If you do feel better, call the office to let us know you have symptoms with exercise.  Important Information About Sugar

## 2022-09-13 ENCOUNTER — Telehealth: Payer: Self-pay

## 2022-09-13 NOTE — Telephone Encounter (Signed)
Med Adherence Report showing pt due for refill on SITAGLIPTIN 25 MG TABLET [JANUVIA]. LVM for pt to call the office.

## 2022-10-02 DIAGNOSIS — I129 Hypertensive chronic kidney disease with stage 1 through stage 4 chronic kidney disease, or unspecified chronic kidney disease: Secondary | ICD-10-CM | POA: Diagnosis not present

## 2022-10-02 DIAGNOSIS — R809 Proteinuria, unspecified: Secondary | ICD-10-CM | POA: Diagnosis not present

## 2022-10-02 DIAGNOSIS — E1122 Type 2 diabetes mellitus with diabetic chronic kidney disease: Secondary | ICD-10-CM | POA: Diagnosis not present

## 2022-10-02 DIAGNOSIS — I2510 Atherosclerotic heart disease of native coronary artery without angina pectoris: Secondary | ICD-10-CM | POA: Diagnosis not present

## 2022-10-02 DIAGNOSIS — Z905 Acquired absence of kidney: Secondary | ICD-10-CM | POA: Diagnosis not present

## 2022-10-02 DIAGNOSIS — N1832 Chronic kidney disease, stage 3b: Secondary | ICD-10-CM | POA: Diagnosis not present

## 2022-10-02 DIAGNOSIS — N400 Enlarged prostate without lower urinary tract symptoms: Secondary | ICD-10-CM | POA: Diagnosis not present

## 2022-10-03 ENCOUNTER — Ambulatory Visit: Payer: Medicare PPO | Admitting: Family Medicine

## 2022-10-03 ENCOUNTER — Encounter: Payer: Self-pay | Admitting: Family Medicine

## 2022-10-03 ENCOUNTER — Telehealth: Payer: Self-pay | Admitting: Family Medicine

## 2022-10-03 VITALS — BP 134/82 | HR 88 | Temp 97.4°F | Ht 71.0 in | Wt 224.6 lb

## 2022-10-03 DIAGNOSIS — U071 COVID-19: Secondary | ICD-10-CM | POA: Diagnosis not present

## 2022-10-03 LAB — POC COVID19 BINAXNOW: SARS Coronavirus 2 Ag: POSITIVE — AB

## 2022-10-03 MED ORDER — HYDROCODONE BIT-HOMATROP MBR 5-1.5 MG/5ML PO SOLN: 5.0000 mL | Freq: Three times a day (TID) | ORAL | 0 refills | Status: DC | PRN

## 2022-10-03 MED ORDER — NIRMATRELVIR/RITONAVIR (PAXLOVID) TABLET (RENAL DOSING): 2.0000 | ORAL_TABLET | Freq: Two times a day (BID) | ORAL | 0 refills | Status: AC

## 2022-10-03 NOTE — Progress Notes (Signed)
[facility] [address] [place] [address] Dept: [phone] Dept Fax: [phone]  Office Visit  Subjective:    Patient ID: Adam Harris, male    DOB: 03-Jun-1946, 76 y.o..   MRN: [id number]  Chief Complaint  Patient presents with   Acute Visit    C/o having cough, chills, fatigue, dizziness x 2-3 days.      History of Present Illness:  Patient is in today with a 4-day history of cough, chills, fatigue, dizziness, and mild increase in his baseline dyspnea on exertion. He notes he and his wife had a typical cold, early last week. He felt tlike this was resolving, when he developed the symptoms on Sat. A day or so before this, his daughter got sick and then tested positive for COVID. He has had COVID twice in the past. He chose nto to receive COVID vaccination. He does have multiple chronic diseases that would put him at high risk for complications of COVID.  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   Chronic diastolic CHF (congestive heart failure), NYHA class 2 ([place]) 04/02/2017   Atrial fibrillation with RVR ([place]) 11/20/2016   OA (osteoarthritis) of knee 05/31/2016   Low back pain 05/31/2016   Exertional angina 10/29/2014   Chronic kidney disease, stage 3b ([place]) 10/29/2014   Coronary artery disease involving native coronary artery of native heart with angina pectoris ([place]) 03/09/2014   Type 2 diabetes mellitus with cardiac complication ([place]) 17/79/3903   Hyperlipidemia 02/05/2014    History of non-ST elevation myocardial infarction (NSTEMI) 02/03/2014   Essential hypertension 02/03/2014   Anxiety state 04/19/2013   History of prostate cancer 04/19/2013   Insomnia 04/19/2013   Stiffness of joint, lower leg 04/19/2013   Sciatica 04/19/2013   Past Surgical History:  Procedure Laterality Date   CARDIAC CATHETERIZATION N/A 11/21/2016   Procedure: Left Heart Cath and Coronary Angiography;  Surgeon: Troy Sine, MD;  Location: [facility];  Service: Cardiovascular;  Laterality: N/A;  pRCA 100% , ostial LAD 45%, OM3 80%, mCFX to dCFX 100% (AV groove), lateral OM3 50%   COLONOSCOPY     CYSTOSCOPY WITH BIOPSY N/A 08/12/2020   Procedure: CYSTOSCOPY WITH BLADDER BIOPSY AND TRANSURETHRAL RESECTION OF BLADDER TUMOR;  Surgeon: [name], MD;  [facility];  Service: Urology;  Laterality: N/A;   CYSTOSCOPY WITH BIOPSY N/A 11/10/2021   Procedure: CYSTOSCOPY WITH BLADDER BIOPSIES/ LEFT RETROGRADE;  Surgeon: [name], MD;  [facility];  Service: Urology;  Laterality: N/A;   CYSTOSCOPY WITH BIOPSY Left 07/27/2022   Procedure: CYSTOSCOPY WITH  BLADDER BIOPSY, TRANSURETHRAL FULGERATION OF BLADDER, EXAM UNDER ANESTHESIA, LEFT RETROGRADE PYELOGRAM;  Surgeon: [name], MD;  [facility];  Service: Urology;  Laterality: Left;   CYSTOSCOPY WITH URETEROSCOPY AND STENT PLACEMENT Right 12/15/2019   Procedure: CYSTOSCOPY WITH RIGHT RETROGRADE/ RIGHT URETEROSCOPY/ BIOPSY;  Surgeon: [name], MD;  Location: [place];  Service: Urology;  Laterality: Right;   CYSTOSCOPY/RETROGRADE/URETEROSCOPY Bilateral 03/18/2018   Procedure: CYSTOSCOPY/RETROGRADE/URETEROSCOPY.;  Surgeon: [name], MD;  Location: [facility];  Service: Urology;  Laterality: Bilateral;   EYE SURGERY Bilateral    cateract in January 2023   HYDROCELE EXCISION Left 07/27/2022   Procedure: HYDROCELE REPAIR;  Surgeon: [name], MD;  [facility];   Service: Urology;  Laterality: Left;  GENERAL ANESTHESIA WITH PARALYSIS   KNEE ARTHROSCOPY Right    LEFT HEART CATHETERIZATION WITH CORONARY ANGIOGRAM N/A 02/04/2014   Procedure: LEFT HEART CATHETERIZATION WITH CORONARY ANGIOGRAM;  Surgeon: [name], MD;  Location: [place];  Service: Cardiovascular;  Laterality: N/A;  severe one-vessel CAD, chronically occluded RCA with faint left-to-right collaterals;  aneurysmal LCFx with sluggish coronary flow;  normal LVSF w/ moderately elevated LVEDP (ostialOM2 20%, pOM3 20%, pD1 20%, mCFX 50%, diffuse 20% pCFX)   PROSTATE BIOPSY N/A 08/12/2020   Procedure: BIOPSY TRANSRECTAL ULTRASONIC PROSTATE (TUBP);  Surgeon: [name], MD;  [facility];  Service: Urology;  Laterality: N/A;   ROBOT ASSITED LAPAROSCOPIC NEPHROURETERECTOMY Right 03/22/2020   Procedure: XI ROBOT ASSITED LAPAROSCOPIC NEPHROURETERECTOMY;  Surgeon: [name], MD;  [facility];  Service: Urology;  Laterality: Right;   TRANSTHORACIC ECHOCARDIOGRAM  11-22-2016   dr hochrein   ef 55-60%/ mild MR and TR/ moderate LAE   TRANSURETHRAL RESECTION OF BLADDER TUMOR N/A 02/10/2021   Procedure: TRANSURETHRAL RESECTION OF BLADDER TUMOR (TURBT)/ CYSTOSCOPY/ LEFT RETROGRADE;  Surgeon: [name], MD;  [facility];  Service: Urology;  Laterality: N/A;  GENERAL ANESTHESIA WITH PARALYSIS   Family History  Problem Relation Age of Onset   Hypertension Mother    Cancer Mother        anal cancer   Esophageal cancer Neg Hx    Pancreatic cancer Neg Hx    Stomach cancer Neg Hx    Liver disease Neg Hx    Outpatient Medications Prior to Visit  Medication Sig Dispense Refill   apixaban (ELIQUIS) 5 MG TABS tablet Take 5 mg by mouth 2 (two) times daily.     diphenhydramine-acetaminophen (TYLENOL PM) 25-500 MG TABS tablet Take 1 tablet by mouth at bedtime as needed (pain/sleep).     finasteride (PROSCAR) 5 MG tablet Take 5 mg by mouth daily.     furosemide (LASIX) 20 MG tablet  TAKE ONE TABLET BY MOUTH DAILY . MAY TAKE EXTRA TABLET IF NEEDED FOR SWELLING 45 tablet 4   furosemide (LASIX) 40 MG tablet Take 40 mg by mouth daily as needed for fluid.     isosorbide mononitrate (IMDUR) 30 MG 24 hr tablet Take 1 tablet (30 mg total) by mouth daily. 90 tablet 1   metoprolol tartrate (LOPRESSOR) 25 MG tablet TAKE ONE TABLET BY MOUTH TWICE A DAY 180 tablet 0   nitroGLYCERIN (NITROSTAT) 0.4 MG SL tablet PLACE ONE TABLET UNDER THE TONGUE EVERY 5 MINUTES TIMES THREE DOSES AS NEEDED FOR CHEST PAIN. CALL 911 IF 2ND DOSE DOES NOT HELP 25 tablet 5   olmesartan (BENICAR) 20 MG tablet TAKE ONE TABLET BY MOUTH DAILY 90 tablet 0   rosuvastatin (CRESTOR) 40 MG tablet Take 1 tablet (40 mg total) by mouth every morning. Please keep scheduled appointment 90 tablet 3   sitaGLIPtin (JANUVIA) 25 MG tablet Take 1 tablet (25 mg total) by mouth daily. 90 tablet 3   amLODipine (NORVASC) 5 MG tablet Take 1 tablet (5 mg total) by mouth daily. (Patient not taking: Reported on 10/03/2022) 90 tablet 3   cefpodoxime (VANTIN) 200 MG tablet Take 200 mg by mouth 2 (two) times daily. (Patient not taking: Reported on 10/03/2022)  HYDROcodone-acetaminophen (NORCO/VICODIN) 5-325 MG tablet Take 1-2 tablets by mouth every 6 (six) hours as needed. (Patient not taking: Reported on 08/25/2022) 20 tablet 0   phenazopyridine (PYRIDIUM) 200 MG tablet Take 1 tablet (200 mg total) by mouth 3 (three) times daily as needed for pain. (Patient not taking: Reported on 08/25/2022) 20 tablet 0   No facility-administered medications prior to visit.   Allergies  Allergen Reactions   Xarelto [Rivaroxaban] Other (See Comments)    Skin Blisters     Objective:   Today's Vitals   10/03/22 1550  BP: 134/82  Pulse: 88  Temp: (!) 97.4 F (36.3 C)  TempSrc: Temporal  SpO2: 97%  Weight: 224 lb 9.6 oz (101.9 kg)  Height: '5\' 11"'$  (1.803 m)   Body mass index is 31.33 kg/m.   General: Well developed, well nourished. No acute  distress. Lungs: Clear to auscultation bilaterally. No wheezing, rales or rhonchi. CV: RRR without murmurs or rubs. Pulses 2+ bilaterally. Psych: Alert and oriented. Normal mood and affect.  Health Maintenance Due  Topic Date Due   Zoster Vaccines- Shingrix (1 of 2) Never done   OPHTHALMOLOGY EXAM  05/18/2022   Lab Results POCT COVID: Positive    Assessment & Plan:   1. COVID-19 Reviewed home care instructions for COVID. Discussed home care for viral illness, including rest, pushing fluids, and OTC medications as needed for symptom relief. Recommend hot tea with honey for sore throat symptoms.Advised self-isolation at home for at least 5 days. After 5 days, if improved and fever resolved, can be in public, but should wear a mask around others for an additional 5 days. If symptoms, esp, dyspnea worsens, recommend re-evaluation.  I will order a course of Paxlovid. I recommend he reduce his Eliquis dose to once a day during the time he is on Paxlovid. I will also prescribe some cough medicine.  - POC COVID-19 - nirmatrelvir/ritonavir EUA, renal dosing, (PAXLOVID) 10 x 150 MG & 10 x '100MG'$  TABS; Take 2 tablets by mouth 2 (two) times daily for 5 days. (Take nirmatrelvir 150 mg one tablet twice daily for 5 days and ritonavir 100 mg one tablet twice daily for 5 days) Patient GFR is 31  Dispense: 20 tablet; Refill: 0 - HYDROcodone bit-homatropine (HYCODAN) 5-1.5 MG/5ML syrup; Take 5 mLs by mouth every 8 (eight) hours as needed for cough.  Dispense: 120 mL; Refill: 0   Return for As scheduled.   Haydee Salter, MD

## 2022-10-03 NOTE — Patient Instructions (Signed)
Decrease apixiban (Eliquis) to 5 mg once a day while on Paxlovid.

## 2022-10-03 NOTE — Telephone Encounter (Signed)
Error, he made an appointment to be seen today 10/03/22

## 2022-10-11 ENCOUNTER — Ambulatory Visit: Payer: Medicare PPO | Admitting: Family Medicine

## 2022-10-11 ENCOUNTER — Encounter: Payer: Self-pay | Admitting: Family Medicine

## 2022-10-11 VITALS — BP 130/80 | HR 78 | Temp 97.4°F | Ht 71.0 in | Wt 226.8 lb

## 2022-10-11 DIAGNOSIS — I4891 Unspecified atrial fibrillation: Secondary | ICD-10-CM | POA: Diagnosis not present

## 2022-10-11 DIAGNOSIS — I25119 Atherosclerotic heart disease of native coronary artery with unspecified angina pectoris: Secondary | ICD-10-CM

## 2022-10-11 DIAGNOSIS — I5032 Chronic diastolic (congestive) heart failure: Secondary | ICD-10-CM | POA: Diagnosis not present

## 2022-10-11 DIAGNOSIS — E1159 Type 2 diabetes mellitus with other circulatory complications: Secondary | ICD-10-CM

## 2022-10-11 DIAGNOSIS — I10 Essential (primary) hypertension: Secondary | ICD-10-CM

## 2022-10-11 LAB — GLUCOSE, RANDOM: Glucose, Bld: 223 mg/dL — ABNORMAL HIGH (ref 70–99)

## 2022-10-11 LAB — HEMOGLOBIN A1C: Hgb A1c MFr Bld: 7.5 % — ABNORMAL HIGH (ref 4.6–6.5)

## 2022-10-11 NOTE — Progress Notes (Signed)
[facility] [address] [place] [address] Dept: [phone] Dept Fax: [phone]  Chronic Care Office Visit  Subjective:    Patient ID: Adam Harris, male    DOB: 1946/07/24, 76 y.o..   MRN: [id number]  Chief Complaint  Patient presents with   Follow-up    F/u.  C/o having ST.      History of Present Illness:  Patient is in today for reassessment of chronic medical issues.  Mr. Carreno has a history of atrial fibrillation, hypertension, coronary artery disease, and chronic diastolic heart failure. He is managed on metoprolol tartrate 25 mg twice daily and olmesartan  20 mg daily for his blood pressure and CHF. He takes furosemide as needed. He continues to hove lower leg edema and uses compression stockings for this. He is on isosorbide mononitrate 30 mg daily for management of angina. He takes Eliquis 5 mg twice daily for anticoagulation related to his a. fib. He also has hyperlipidemia and is managed on rosuvastatin 40 mg daily.   Mr. Elk has a history of Type 2 diabetes. He is managed on sitagliptin (Januvia) 25 mg daily.   Mr. 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 He has had a right nephroureterectomy. He continue to follow with Dr. [name] for monitoring of this.  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   Chronic diastolic CHF (congestive heart failure), NYHA class 2 ([place]) 04/02/2017   Atrial fibrillation with RVR ([place]) 11/20/2016   OA  (osteoarthritis) of knee 05/31/2016   Low back pain 05/31/2016   Exertional angina 10/29/2014   Chronic kidney disease, stage 3b ([place]) 10/29/2014   Coronary artery disease involving native coronary artery of native heart with angina pectoris ([place]) 03/09/2014   Type 2 diabetes mellitus with cardiac complication ([place]) 37/62/8315   Hyperlipidemia 02/05/2014   History of non-ST elevation myocardial infarction (NSTEMI) 02/03/2014   Essential hypertension 02/03/2014   Anxiety state 04/19/2013   History of prostate cancer 04/19/2013   Insomnia 04/19/2013   Stiffness of joint, lower leg 04/19/2013   Sciatica 04/19/2013   Past Surgical History:  Procedure Laterality Date   CARDIAC CATHETERIZATION N/A 11/21/2016   Procedure: Left Heart Cath and Coronary Angiography;  Surgeon: Troy Sine, MD;  Location: [facility];  Service: Cardiovascular;  Laterality: N/A;  pRCA 100% , ostial LAD 45%, OM3 80%, mCFX to dCFX 100% (AV groove), lateral OM3 50%   COLONOSCOPY     CYSTOSCOPY WITH BIOPSY N/A 08/12/2020   Procedure: CYSTOSCOPY WITH BLADDER BIOPSY AND TRANSURETHRAL RESECTION OF BLADDER TUMOR;  Surgeon: [name], MD;  [facility];  Service: Urology;  Laterality: N/A;   CYSTOSCOPY WITH BIOPSY N/A 11/10/2021   Procedure: CYSTOSCOPY WITH BLADDER BIOPSIES/ LEFT RETROGRADE;  Surgeon: [name], MD;  [facility];  Service: Urology;  Laterality: N/A;   CYSTOSCOPY WITH BIOPSY Left 07/27/2022   Procedure: CYSTOSCOPY WITH  BLADDER BIOPSY, TRANSURETHRAL FULGERATION OF BLADDER, EXAM UNDER ANESTHESIA, LEFT RETROGRADE PYELOGRAM;  Surgeon: [name], MD;  [facility];  Service:  Urology;  Laterality: Left;   CYSTOSCOPY WITH URETEROSCOPY AND STENT PLACEMENT Right 12/15/2019   Procedure: CYSTOSCOPY WITH RIGHT RETROGRADE/ RIGHT URETEROSCOPY/ BIOPSY;  Surgeon: [name], MD;  Location: [facility];  Service: Urology;  Laterality: Right;    CYSTOSCOPY/RETROGRADE/URETEROSCOPY Bilateral 03/18/2018   Procedure: CYSTOSCOPY/RETROGRADE/URETEROSCOPY.;  Surgeon: [name], MD;  Location: [facility];  Service: Urology;  Laterality: Bilateral;   EYE SURGERY Bilateral    cateract in January 2023   HYDROCELE EXCISION Left 07/27/2022   Procedure: HYDROCELE REPAIR;  Surgeon: [name], MD;  [facility];  Service: Urology;  Laterality: Left;  GENERAL ANESTHESIA WITH PARALYSIS   KNEE ARTHROSCOPY Right    LEFT HEART CATHETERIZATION WITH CORONARY ANGIOGRAM N/A 02/04/2014   Procedure: LEFT HEART CATHETERIZATION WITH CORONARY ANGIOGRAM;  Surgeon: [name], MD;  Location: [facility];  Service: Cardiovascular;  Laterality: N/A;  severe one-vessel CAD, chronically occluded RCA with faint left-to-right collaterals;  aneurysmal LCFx with sluggish coronary flow;  normal LVSF w/ moderately elevated LVEDP (ostialOM2 20%, pOM3 20%, pD1 20%, mCFX 50%, diffuse 20% pCFX)   PROSTATE BIOPSY N/A 08/12/2020   Procedure: BIOPSY TRANSRECTAL ULTRASONIC PROSTATE (TUBP);  Surgeon: [name], MD;  [facility];  Service: Urology;  Laterality: N/A;   ROBOT ASSITED LAPAROSCOPIC NEPHROURETERECTOMY Right 03/22/2020   Procedure: XI ROBOT ASSITED LAPAROSCOPIC NEPHROURETERECTOMY;  Surgeon: [name], MD;  [facility];  Service: Urology;  Laterality: Right;   TRANSTHORACIC ECHOCARDIOGRAM  11-22-2016   dr hochrein   ef 55-60%/ mild MR and TR/ moderate LAE   TRANSURETHRAL RESECTION OF BLADDER TUMOR N/A 02/10/2021   Procedure: TRANSURETHRAL RESECTION OF BLADDER TUMOR (TURBT)/ CYSTOSCOPY/ LEFT RETROGRADE;  Surgeon: [name], MD;  [facility];  Service: Urology;  Laterality: N/A;  GENERAL ANESTHESIA WITH PARALYSIS   Family History  Problem Relation Age of Onset   Hypertension Mother    Cancer Mother        anal cancer   Esophageal cancer Neg Hx    Pancreatic cancer Neg Hx    Stomach cancer Neg Hx    Liver disease  Neg Hx    Outpatient Medications Prior to Visit  Medication Sig Dispense Refill   apixaban (ELIQUIS) 5 MG TABS tablet Take 5 mg by mouth 2 (two) times daily.     diphenhydramine-acetaminophen (TYLENOL PM) 25-500 MG TABS tablet Take 1 tablet by mouth at bedtime as needed (pain/sleep).     finasteride (PROSCAR) 5 MG tablet Take 5 mg by mouth daily.     furosemide (LASIX) 20 MG tablet TAKE ONE TABLET BY MOUTH DAILY . MAY TAKE EXTRA TABLET IF NEEDED FOR SWELLING 45 tablet 4   furosemide (LASIX) 40 MG tablet Take 40 mg by mouth daily as needed for fluid.     isosorbide mononitrate (IMDUR) 30 MG 24 hr tablet Take 1 tablet (30 mg total) by mouth daily. 90 tablet 1   metoprolol tartrate (LOPRESSOR) 25 MG tablet TAKE ONE TABLET BY MOUTH TWICE A DAY 180 tablet 0   nitroGLYCERIN (NITROSTAT) 0.4 MG SL tablet PLACE ONE TABLET UNDER THE TONGUE EVERY 5 MINUTES TIMES THREE DOSES AS NEEDED FOR CHEST PAIN. CALL 911 IF 2ND DOSE DOES NOT HELP 25 tablet 5   olmesartan (BENICAR) 20 MG tablet TAKE ONE TABLET BY MOUTH DAILY 90 tablet 0   rosuvastatin (CRESTOR) 40 MG tablet Take 1 tablet (40 mg total) by mouth every morning. Please keep scheduled appointment 90 tablet 3   sitaGLIPtin (JANUVIA) 25  MG tablet Take 1 tablet (25 mg total) by mouth daily. 90 tablet 3   HYDROcodone bit-homatropine (HYCODAN) 5-1.5 MG/5ML syrup Take 5 mLs by mouth every 8 (eight) hours as needed for cough. 120 mL 0   No facility-administered medications prior to visit.   Allergies  Allergen Reactions   Xarelto [Rivaroxaban] Other (See Comments)    Skin Blisters      Objective:   Today's Vitals   10/11/22 1305  BP: 130/80  Pulse: 78  Temp: (!) 97.4 F (36.3 C)  TempSrc: Temporal  SpO2: 99%  Weight: 226 lb 12.8 oz (102.9 kg)  Height: '5\' 11"'$  (1.803 m)   Body mass index is 31.63 kg/m.   General: Well developed, well nourished. No acute distress. Psych: Alert and oriented. Normal mood and affect.  Health Maintenance Due  Topic  Date Due   Zoster Vaccines- Shingrix (1 of 2) Never done   OPHTHALMOLOGY EXAM  05/18/2022     Assessment & Plan:   1. Type 2 diabetes mellitus with cardiac complication (HCC) We will recheck an A1c today. Continue sitagliptin 25 mg daily.  - Glucose, random - Hemoglobin A1c  2. Essential hypertension Blood pressure is in adequate control. Continue olmesartan 20 mg daily.  3. Coronary artery disease involving native coronary artery of native heart with angina pectoris (HCC) No chest pain. Continue isosorbide mononitrate 30 mg daily and metoprolol tartrate 25 mg bid.  4. Atrial fibrillation with RVR (HCC) Rate well controlled. Continue metoprolol tartrate 25 mg twice a day and apixiban 5 mg twice a day.  5. Chronic diastolic CHF (congestive heart failure), NYHA class 2 (HCC) Compensated. Continue metoprolol tartrate 25 mg bid and PRN Lasix.  Return in about 3 months (around 01/11/2023).   Haydee Salter, MD

## 2022-10-31 NOTE — Progress Notes (Deleted)
Cardiology Office Note   Date:  10/31/2022   ID:  Adam Harris, DOB 1946/10/28, MRN [id number]  PCP:  Haydee Salter, MD  Cardiologist:   Minus Breeding, MD    No chief complaint on file.     History of Present Illness: Adam Harris is a 76 y.o. male who presents for atrial fib and CAD and atrial fibrillation.  Most recent cardiac cath  01/2014 revealed severe 1 vessel disease with CTO RCA with faint left to right collaterals, aneurysmal LCFx with sluggish coronary flow;  normal LVSF w/ moderately elevated LVEDP (ostialOM2 20%, pOM3 20%, pD1 20%, mCFX 50%, diffuse 20% pCFX). Chronic diastolic CHF, atrial fib and HL.   At the last visit he had leg swelling and I sent him for an echo.  This demonstrated a normal EF.  There were no significant ***   ***  he has had some increased lower extremity swelling.  His weights are up only about 4 pounds.  However, he said that the past few weeks or so he has had increased swelling.  He does take 20 mg of Lasix daily.  He is not particularly watching his salt.  He does not drink a ton of fluid.  He is not having any new shortness of breath, PND or orthopnea.  He is not having any new palpitations, presyncope or syncope.   Past Medical History:  Diagnosis Date   Abnormal radiologic findings on diagnostic imaging of renal pelvis, ureter, or bladder    bilateral ureter abnormalities   Anticoagulant long-term use    eliquis   Anxiety    Arthritis    CAD (coronary artery disease) cardiologist-- dr Shevon Sian   NSTEMI 02-04-2014  per cardiac cath chronic occluded RCA w/ faint left-to-right collaterals and aneurysmal LCFx with sluggish coronary flow/   NSTEMI --11-21-2016 per cardiac cath occluded proximal RCA & mid to diastal CFX 100%, med rx. If that does not work, PTCA or CABG   Cancer [facility])    bladder and kidney   CKD (chronic kidney disease), stage III ([place])    patient unaware   DOE (dyspnea on exertion)    2-3 flights of stairs   Fatty liver     pt denies   Hematuria 02/2019   History of COVID-19 10/2019   History of non-ST elevation myocardial infarction (NSTEMI)    02-04-2014  and 11-21-2016  cardiac cath done both times ,  medically management   History of shingles 12/2017   slight pain and numbness still noted in the area   Hyperlipidemia    Hypertension    Insomnia    Myocardial infarction [facility]) 2015   Persistent atrial fibrillation [facility])    cardiologsit-- dr Cherise Fedder   Thoracic aortic atherosclerosis ([place])    Type 2 diabetes mellitus ([place])    Urinary frequency     Past Surgical History:  Procedure Laterality Date   CARDIAC CATHETERIZATION N/A 11/21/2016   Procedure: Left Heart Cath and Coronary Angiography;  Surgeon: Troy Sine, MD;  Location: [place] [facility];  Service: Cardiovascular;  Laterality: N/A;  pRCA 100% , ostial LAD 45%, OM3 80%, mCFX to dCFX 100% (AV groove), lateral OM3 50%   COLONOSCOPY     CYSTOSCOPY WITH BIOPSY N/A 08/12/2020   Procedure: CYSTOSCOPY WITH BLADDER BIOPSY AND TRANSURETHRAL RESECTION OF BLADDER TUMOR;  Surgeon: [name], MD;  [facility];  Service: Urology;  Laterality: N/A;   CYSTOSCOPY WITH BIOPSY N/A 11/10/2021   Procedure:  CYSTOSCOPY WITH BLADDER BIOPSIES/ LEFT RETROGRADE;  Surgeon: [name], MD;  [facility];  Service: Urology;  Laterality: N/A;   CYSTOSCOPY WITH BIOPSY Left 07/27/2022   Procedure: CYSTOSCOPY WITH  BLADDER BIOPSY, TRANSURETHRAL FULGERATION OF BLADDER, EXAM UNDER ANESTHESIA, LEFT RETROGRADE PYELOGRAM;  Surgeon: [name], MD;  [facility];  Service: Urology;  Laterality: Left;   CYSTOSCOPY WITH URETEROSCOPY AND STENT PLACEMENT Right 12/15/2019   Procedure: CYSTOSCOPY WITH RIGHT RETROGRADE/ RIGHT URETEROSCOPY/ BIOPSY;  Surgeon: [name], MD;  Location: [place];  Service: Urology;  Laterality: Right;   CYSTOSCOPY/RETROGRADE/URETEROSCOPY Bilateral 03/18/2018   Procedure: CYSTOSCOPY/RETROGRADE/URETEROSCOPY.;   Surgeon: [name], MD;  Location: [facility];  Service: Urology;  Laterality: Bilateral;   EYE SURGERY Bilateral    cateract in January 2023   HYDROCELE EXCISION Left 07/27/2022   Procedure: HYDROCELE REPAIR;  Surgeon: [name], MD;  [facility];  Service: Urology;  Laterality: Left;  GENERAL ANESTHESIA WITH PARALYSIS   KNEE ARTHROSCOPY Right    LEFT HEART CATHETERIZATION WITH CORONARY ANGIOGRAM N/A 02/04/2014   Procedure: LEFT HEART CATHETERIZATION WITH CORONARY ANGIOGRAM;  Surgeon: [name], MD;  Location: [place] CATH LAB;  Service: Cardiovascular;  Laterality: N/A;  severe one-vessel CAD, chronically occluded RCA with faint left-to-right collaterals;  aneurysmal LCFx with sluggish coronary flow;  normal LVSF w/ moderately elevated LVEDP (ostialOM2 20%, pOM3 20%, pD1 20%, mCFX 50%, diffuse 20% pCFX)   PROSTATE BIOPSY N/A 08/12/2020   Procedure: BIOPSY TRANSRECTAL ULTRASONIC PROSTATE (TUBP);  Surgeon: [name], MD;  [facility];  Service: Urology;  Laterality: N/A;   ROBOT ASSITED LAPAROSCOPIC NEPHROURETERECTOMY Right 03/22/2020   Procedure: XI ROBOT ASSITED LAPAROSCOPIC NEPHROURETERECTOMY;  Surgeon: [name], MD;  [facility];  Service: Urology;  Laterality: Right;   TRANSTHORACIC ECHOCARDIOGRAM  11-22-2016   dr Denica Web   ef 55-60%/ mild MR and TR/ moderate LAE   TRANSURETHRAL RESECTION OF BLADDER TUMOR N/A 02/10/2021   Procedure: TRANSURETHRAL RESECTION OF BLADDER TUMOR (TURBT)/ CYSTOSCOPY/ LEFT RETROGRADE;  Surgeon: [name], MD;  [facility];  Service: Urology;  Laterality: N/A;  GENERAL ANESTHESIA WITH PARALYSIS     Current Outpatient Medications  Medication Sig Dispense Refill   apixaban (ELIQUIS) 5 MG TABS tablet Take 5 mg by mouth 2 (two) times daily.     diphenhydramine-acetaminophen (TYLENOL PM) 25-500 MG TABS tablet Take 1 tablet by mouth at bedtime as needed (pain/sleep).     finasteride (PROSCAR) 5 MG tablet  Take 5 mg by mouth daily.     furosemide (LASIX) 20 MG tablet TAKE ONE TABLET BY MOUTH DAILY . MAY TAKE EXTRA TABLET IF NEEDED FOR SWELLING 45 tablet 4   furosemide (LASIX) 40 MG tablet Take 40 mg by mouth daily as needed for fluid.     isosorbide mononitrate (IMDUR) 30 MG 24 hr tablet Take 1 tablet (30 mg total) by mouth daily. 90 tablet 1   metoprolol tartrate (LOPRESSOR) 25 MG tablet TAKE ONE TABLET BY MOUTH TWICE A DAY 180 tablet 0   nitroGLYCERIN (NITROSTAT) 0.4 MG SL tablet PLACE ONE TABLET UNDER THE TONGUE EVERY 5 MINUTES TIMES THREE DOSES AS NEEDED FOR CHEST PAIN. CALL 911 IF 2ND DOSE DOES NOT HELP 25 tablet 5   olmesartan (BENICAR) 20 MG tablet TAKE ONE TABLET BY MOUTH DAILY 90 tablet 0   rosuvastatin (CRESTOR) 40 MG tablet Take 1 tablet (40 mg total) by mouth every morning. Please keep scheduled appointment 90 tablet 3   sitaGLIPtin (JANUVIA)  25 MG tablet Take 1 tablet (25 mg total) by mouth daily. 90 tablet 3   No current facility-administered medications for this visit.    Allergies:   Xarelto [rivaroxaban]    ROS:  Please see the history of present illness.   Otherwise, review of systems are negative for all ***.   All other systems are reviewed and negative.    PHYSICAL EXAM: VS:  There were no vitals taken for this visit. , BMI There is no height or weight on file to calculate BMI.  GENERAL:  Well appearing NECK:  No jugular venous distention, waveform within normal limits, carotid upstroke brisk and symmetric, no bruits, no thyromegaly LUNGS:  Clear to auscultation bilaterally CHEST:  Unremarkable HEART:  PMI not displaced or sustained,S1 and S2 within normal limits, no S3, no S4, no clicks, no rubs, *** murmurs ABD:  Flat, positive bowel sounds normal in frequency in pitch, no bruits, no rebound, no guarding, no midline pulsatile mass, no hepatomegaly, no splenomegaly EXT:  2 plus pulses throughout, no edema, no cyanosis no clubbing    ***GENERAL:  Well  appearing NECK:  Positive jugular venous distention 6 cm at 45 degrees, waveform within normal limits, carotid upstroke brisk and symmetric, no bruits, no thyromegaly LUNGS:  Clear to auscultation bilaterally CHEST:  Unremarkable HEART:  PMI not displaced or sustained,S1 and S2 within normal limits, no S3, no clicks, no rubs, no murmurs, irregular  ABD:  Flat, positive bowel sounds normal in frequency in pitch, no bruits, no rebound, no guarding, no midline pulsatile mass, no hepatomegaly, no splenomegaly EXT:  2 plus pulses throughout, moderate bilateral lower extremity edema, no cyanosis no clubbing  EKG:  EKG is *** ordered today. Atrial fibrillation, rate ***, axis within normal limits.  No changes.    Recent Labs: 05/02/2022: TSH 4.75 07/25/2022: ALT 14; BUN 37; Creatinine, Ser 2.15; Hemoglobin 14.0; Platelets 157; Potassium 4.3; Sodium 140    Lipid Panel    Component Value Date/Time   CHOL 100 05/02/2022 1105   CHOL 149 06/25/2019 1621   TRIG 51.0 05/02/2022 1105   HDL 35.80 (L) 05/02/2022 1105   HDL 43 06/25/2019 1621   CHOLHDL 3 05/02/2022 1105   VLDL 10.2 05/02/2022 1105   LDLCALC 54 05/02/2022 1105   LDLCALC 88 06/25/2019 1621      Wt Readings from Last 3 Encounters:  10/11/22 226 lb 12.8 oz (102.9 kg)  10/03/22 224 lb 9.6 oz (101.9 kg)  08/25/22 221 lb 12.8 oz (100.6 kg)      Other studies Reviewed: Additional studies/ records that were reviewed today include: *** Review of the above records demonstrates:   See elsewhere   ASSESSMENT AND PLAN:  CHRONIC ATRIAL FIB:  Adam Harris has a CHA2DS2 - VASc score of 3.  *** He tolerates anticoagulation.  No change in therapy.  He is up-to-date with blood work.  CAD:  *** The patient has no new sypmtoms.  No further cardiovascular testing is indicated.  We will continue with aggressive risk reduction and meds as listed.    HTN:  The blood pressure is *** at target.  No change in therapy.   CHRONIC DIASTOLIC HF:  ***    I am going to get an echocardiogram.  I am going to increase his Lasix to 40 mg daily for the next 3 days.  He is going to keep his feet elevated.   DYSLIPIDEMIA:    LDL was  *** 54 with an HDL 35.  No change in therapy.   DM: His A1c was ***  7.1.   CKD IV:   ***  I will be checking a basic metabolic profile next week as above.  He has close follow-up with his kidney doctors.  Current medicines are reviewed at length with the patient today.  The patient does not have concerns regarding medicines.  The following changes have been made:  ***  Labs/ tests ordered today include:  ***  No orders of the defined types were placed in this encounter.    Disposition:   FU with ***  Signed, Minus Breeding, MD  10/31/2022 9:37 PM    [place]

## 2022-11-02 ENCOUNTER — Ambulatory Visit: Payer: Medicare PPO | Admitting: Cardiology

## 2022-11-02 DIAGNOSIS — I5032 Chronic diastolic (congestive) heart failure: Secondary | ICD-10-CM

## 2022-11-02 DIAGNOSIS — E785 Hyperlipidemia, unspecified: Secondary | ICD-10-CM

## 2022-11-02 DIAGNOSIS — E118 Type 2 diabetes mellitus with unspecified complications: Secondary | ICD-10-CM

## 2022-11-02 DIAGNOSIS — I482 Chronic atrial fibrillation, unspecified: Secondary | ICD-10-CM

## 2022-11-02 DIAGNOSIS — I10 Essential (primary) hypertension: Secondary | ICD-10-CM

## 2022-11-10 ENCOUNTER — Other Ambulatory Visit: Payer: Self-pay | Admitting: Cardiology

## 2022-11-10 DIAGNOSIS — R300 Dysuria: Secondary | ICD-10-CM | POA: Diagnosis not present

## 2022-11-10 DIAGNOSIS — R351 Nocturia: Secondary | ICD-10-CM | POA: Diagnosis not present

## 2022-11-10 DIAGNOSIS — C678 Malignant neoplasm of overlapping sites of bladder: Secondary | ICD-10-CM | POA: Diagnosis not present

## 2022-11-10 DIAGNOSIS — N401 Enlarged prostate with lower urinary tract symptoms: Secondary | ICD-10-CM | POA: Diagnosis not present

## 2022-11-17 DIAGNOSIS — C679 Malignant neoplasm of bladder, unspecified: Secondary | ICD-10-CM | POA: Diagnosis not present

## 2022-11-17 DIAGNOSIS — C678 Malignant neoplasm of overlapping sites of bladder: Secondary | ICD-10-CM | POA: Diagnosis not present

## 2022-11-17 DIAGNOSIS — Z8551 Personal history of malignant neoplasm of bladder: Secondary | ICD-10-CM | POA: Diagnosis not present

## 2022-11-23 NOTE — Progress Notes (Signed)
Cardiology Office Note   Date:  11/24/2022   ID:  Adam Harris, DOB October 28, 1946, MRN [id number]  PCP:  Adam Salter, MD  Cardiologist:   Adam Breeding, MD    Chief Complaint  Patient presents with   Atrial Fibrillation   Shortness of Breath      History of Present Illness: Adam Harris is a 77 y.o. male who presents for atrial fib and CAD and atrial fibrillation.  Most recent cardiac cath  01/2014 revealed severe 1 vessel disease with CTO RCA with faint left to right collaterals, aneurysmal LCFx with sluggish coronary flow;  normal LVSF w/ moderately elevated LVEDP (ostialOM2 20%, pOM3 20%, pD1 20%, mCFX 50%, diffuse 20% pCFX). Chronic diastolic CHF, atrial fib and HL.   At the last visit he had leg swelling and I sent him for an echo.  This demonstrated a normal EF.  There were no significant valvular abnormalities and only mild pulmonary hypertension.  He says that his lower extremity swelling is improved.  However, he seems to be having increasing shortness of breath.  His oxygen saturation was 88% walking into the room today.  His wife worries that he is more short of breath and he says its about a flight of stairs.  He however seems to be describing some PND and orthopnea.  It does not seem to be any cough fevers or chills.  His blood pressure is elevated today and he said it was elevated when he was seen by neurology recently.  He has been off of anticoagulation because of hematuria.  He is getting this evaluated.  He has not been having any chest pressure, neck or arm discomfort.  He is not really noticing his fibrillation.  He has not had any presyncope or syncope.   Past Medical History:  Diagnosis Date   Abnormal radiologic findings on diagnostic imaging of renal pelvis, ureter, or bladder    bilateral ureter abnormalities   Anticoagulant long-term use    eliquis   Anxiety    Arthritis    CAD (coronary artery disease) cardiologist-- dr Adam Harris   NSTEMI 02-04-2014  per  cardiac cath chronic occluded RCA w/ faint left-to-right collaterals and aneurysmal LCFx with sluggish coronary flow/   NSTEMI --11-21-2016 per cardiac cath occluded proximal RCA & mid to diastal CFX 100%, med rx. 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   CKD (chronic kidney disease), stage III ([place])    patient unaware   DOE (dyspnea on exertion)    Fatty liver    pt denies   Hematuria 02/2019   History of COVID-19 10/2019   History of non-ST elevation myocardial infarction (NSTEMI)    02-04-2014  and 11-21-2016  cardiac cath done both times ,  medically management   History of shingles 12/2017   slight pain and numbness still noted in the area   Hyperlipidemia    Hypertension    Insomnia    Myocardial infarction [facility]) 2015   Persistent atrial fibrillation [facility])    cardiologsit-- dr Adam Harris   Thoracic aortic atherosclerosis ([place])    Type 2 diabetes mellitus ([place])    Urinary frequency     Past Surgical History:  Procedure Laterality Date   CARDIAC CATHETERIZATION N/A 11/21/2016   Procedure: Left Heart Cath and Coronary Angiography;  Surgeon: Adam Sine, MD;  Location: [place] CV LAB;  Service: Cardiovascular;  Laterality: N/A;  pRCA 100% ,  ostial LAD 45%, OM3 80%, mCFX to dCFX 100% (AV groove), lateral OM3 50%   COLONOSCOPY     CYSTOSCOPY WITH BIOPSY N/A 08/12/2020   Procedure: CYSTOSCOPY WITH BLADDER BIOPSY AND TRANSURETHRAL RESECTION OF BLADDER TUMOR;  Surgeon: Adam Bring, MD;  [facility];  Service: Urology;  Laterality: N/A;   CYSTOSCOPY WITH BIOPSY N/A 11/10/2021   Procedure: CYSTOSCOPY WITH BLADDER BIOPSIES/ LEFT RETROGRADE;  Surgeon: Adam Bring, MD;  [facility];  Service: Urology;  Laterality: N/A;   CYSTOSCOPY WITH BIOPSY Left 07/27/2022   Procedure: CYSTOSCOPY WITH  BLADDER BIOPSY, TRANSURETHRAL FULGERATION OF BLADDER, EXAM UNDER ANESTHESIA, LEFT RETROGRADE PYELOGRAM;  Surgeon: Adam Bring, MD;  [facility];  Service: Urology;  Laterality: Left;   CYSTOSCOPY WITH URETEROSCOPY AND STENT PLACEMENT Right 12/15/2019   Procedure: CYSTOSCOPY WITH RIGHT RETROGRADE/ RIGHT URETEROSCOPY/ BIOPSY;  Surgeon: Adam Rhodes, MD;  Location: [place];  Service: Urology;  Laterality: Right;   CYSTOSCOPY/RETROGRADE/URETEROSCOPY Bilateral 03/18/2018   Procedure: CYSTOSCOPY/RETROGRADE/URETEROSCOPY.;  Surgeon: Adam Rhodes, MD;  Location: [facility];  Service: Urology;  Laterality: Bilateral;   EYE SURGERY Bilateral    cateract in January 2023   HYDROCELE EXCISION Left 07/27/2022   Procedure: HYDROCELE REPAIR;  Surgeon: Adam Bring, MD;  [facility];  Service: Urology;  Laterality: Left;  GENERAL ANESTHESIA WITH PARALYSIS   KNEE ARTHROSCOPY Right    LEFT HEART CATHETERIZATION WITH CORONARY ANGIOGRAM N/A 02/04/2014   Procedure: LEFT HEART CATHETERIZATION WITH CORONARY ANGIOGRAM;  Surgeon: Adam Hampshire, MD;  Location: [place] CATH LAB;  Service: Cardiovascular;  Laterality: N/A;  severe one-vessel CAD, chronically occluded RCA with faint left-to-right collaterals;  aneurysmal LCFx with sluggish coronary flow;  normal LVSF w/ moderately elevated LVEDP (ostialOM2 20%, pOM3 20%, pD1 20%, mCFX 50%, diffuse 20% pCFX)   PROSTATE BIOPSY N/A 08/12/2020   Procedure: BIOPSY TRANSRECTAL ULTRASONIC PROSTATE (TUBP);  Surgeon: Adam Bring, MD;  [facility];  Service: Urology;  Laterality: N/A;   ROBOT ASSITED LAPAROSCOPIC NEPHROURETERECTOMY Right 03/22/2020   Procedure: XI ROBOT ASSITED LAPAROSCOPIC NEPHROURETERECTOMY;  Surgeon: Adam Bring, MD;  [facility];  Service: Urology;  Laterality: Right;   TRANSTHORACIC ECHOCARDIOGRAM  11-22-2016   dr Adam Harris   ef 55-60%/ mild MR and TR/ moderate LAE   TRANSURETHRAL RESECTION OF BLADDER TUMOR N/A 02/10/2021   Procedure: TRANSURETHRAL RESECTION OF BLADDER TUMOR (TURBT)/ CYSTOSCOPY/ LEFT RETROGRADE;  Surgeon: Adam Bring, MD;   [facility];  Service: Urology;  Laterality: N/A;  GENERAL ANESTHESIA WITH PARALYSIS     Current Outpatient Medications  Medication Sig Dispense Refill   amLODipine (NORVASC) 5 MG tablet Take 1 tablet (5 mg total) by mouth daily. 90 tablet 3   apixaban (ELIQUIS) 5 MG TABS tablet Take 5 mg by mouth 2 (two) times daily.     cephALEXin (KEFLEX) 500 MG capsule Take 500 mg by mouth 3 (three) times daily.     diphenhydramine-acetaminophen (TYLENOL PM) 25-500 MG TABS tablet Take 1 tablet by mouth at bedtime as needed (pain/sleep).     finasteride (PROSCAR) 5 MG tablet Take 5 mg by mouth daily.     furosemide (LASIX) 20 MG tablet TAKE ONE TABLET BY MOUTH DAILY . MAY TAKE EXTRA TABLET IF NEEDED FOR SWELLING (Patient taking differently: Take 40 mg by mouth daily. May take extra tablet if needed for swelling) 45 tablet 4   isosorbide mononitrate (IMDUR) 30 MG 24 hr tablet Take 1 tablet (30 mg total) by mouth daily. 90 tablet 1  metoprolol tartrate (LOPRESSOR) 25 MG tablet TAKE ONE TABLET BY MOUTH TWICE A DAY 180 tablet 0   nitroGLYCERIN (NITROSTAT) 0.4 MG SL tablet TAKE ONE TABLET UNDER THE TONGUE EVERY FIVE MINUTES FOR THREE DOSES AS NEEDED FOR CHEST PAIN, CALL 911 IF 2ND DOSE DOESN'T HELP 25 tablet 4   olmesartan (BENICAR) 20 MG tablet TAKE ONE TABLET BY MOUTH DAILY (Patient taking differently: Take 40 mg by mouth daily.) 90 tablet 0   rosuvastatin (CRESTOR) 40 MG tablet Take 1 tablet (40 mg total) by mouth every morning. Please keep scheduled appointment 90 tablet 3   sitaGLIPtin (JANUVIA) 25 MG tablet Take 1 tablet (25 mg total) by mouth daily. 90 tablet 3   No current facility-administered medications for this visit.    Allergies:   Xarelto [rivaroxaban]    ROS:  Please see the history of present illness.   Otherwise, review of systems are negative for decreased memory.   All other systems are reviewed and negative.    PHYSICAL EXAM: VS:  BP (!) 188/110   Pulse 79   Ht 6' (1.829 m)    Wt 227 lb 3.2 oz (103.1 kg)   BMI 30.81 kg/m  , BMI Body mass index is 30.81 kg/m.  GENERAL:  Well appearing NECK: Positive jugular venous distention, waveform within normal limits, carotid upstroke brisk and symmetric, no bruits, no thyromegaly LUNGS:  Clear to auscultation bilaterally CHEST:  Unremarkable HEART:  PMI not displaced or sustained,S1 and S2 within normal limits, no S3, no clicks, no rubs, no murmurs, irregular ABD:  Flat, positive bowel sounds normal in frequency in pitch, no bruits, no rebound, no guarding, no midline pulsatile mass, no hepatomegaly, no splenomegaly EXT:  2 plus pulses throughout, moderate bilateral leg edema, no cyanosis no clubbing  EKG:  EKG is not ordered today.    Recent Labs: 05/02/2022: TSH 4.75 07/25/2022: ALT 14; BUN 37; Creatinine, Ser 2.15; Hemoglobin 14.0; Platelets 157; Potassium 4.3; Sodium 140    Lipid Panel    Component Value Date/Time   CHOL 100 05/02/2022 1105   CHOL 149 06/25/2019 1621   TRIG 51.0 05/02/2022 1105   HDL 35.80 (L) 05/02/2022 1105   HDL 43 06/25/2019 1621   CHOLHDL 3 05/02/2022 1105   VLDL 10.2 05/02/2022 1105   LDLCALC 54 05/02/2022 1105   LDLCALC 88 06/25/2019 1621      Wt Readings from Last 3 Encounters:  11/24/22 227 lb 3.2 oz (103.1 kg)  10/11/22 226 lb 12.8 oz (102.9 kg)  10/03/22 224 lb 9.6 oz (101.9 kg)      Other studies Reviewed: Additional studies/ records that were reviewed today include: Labs Review of the above records demonstrates:   See elsewhere   ASSESSMENT AND PLAN:  PERMANENT ATRIAL FIB:  Mr. Nasri Boakye has a CHA2DS2 - VASc score of 3.  Right now he is off anticoagulation because of hematuria that is being worked up.  He should be back on this when felt safe from a urologic standpoint.   CAD: I do not suspect an anginal equivalent and would have high threshold for invasive testing given his renal insufficiency.  I am going to evaluate his shortness of breath as below.   HTN:  The  blood pressure is not at target and he needs 5 mg of amlodipine added to his regimen.   CHRONIC DIASTOLIC HF: I am going to check a BNP, basic metabolic profile, CBC and a chest x-ray.  DYSLIPIDEMIA:    LDL was 54  and he can continue the meds as listed.   DM: His A1c was 7.5.  I will defer to his primary provider.  CKD IV:   Creatinine was 2.06 and stable from previous.  SOB: He tends to minimize his symptoms.  However, his oxygen saturations as below.  I am going to be ordering pulmonary function tests.  I will be checking make sure he is not anemic as above.  I will be checking a BNP and chest x-ray.  He might need right heart cath eventually.  I like to see him back in a few weeks after all of these results are available.  In particular have to have the results of the PFTs.    Current medicines are reviewed at length with the patient today.  The patient does not have concerns regarding medicines.  The following changes have been made:  As above  Labs/ tests ordered today include:   Orders Placed This Encounter  Procedures   DG Chest 2 View   Basic metabolic panel   Brain natriuretic peptide   CBC   Pulmonary Function Test     Disposition:   FU with me after the PFTs.   Signed, Adam Breeding, MD  11/24/2022 9:23 AM    [place]

## 2022-11-24 ENCOUNTER — Encounter: Payer: Self-pay | Admitting: Cardiology

## 2022-11-24 ENCOUNTER — Ambulatory Visit: Payer: Medicare PPO | Attending: Cardiology | Admitting: Cardiology

## 2022-11-24 VITALS — BP 188/110 | HR 79 | Ht 72.0 in | Wt 227.2 lb

## 2022-11-24 DIAGNOSIS — I10 Essential (primary) hypertension: Secondary | ICD-10-CM | POA: Diagnosis not present

## 2022-11-24 DIAGNOSIS — I25119 Atherosclerotic heart disease of native coronary artery with unspecified angina pectoris: Secondary | ICD-10-CM

## 2022-11-24 DIAGNOSIS — E785 Hyperlipidemia, unspecified: Secondary | ICD-10-CM

## 2022-11-24 DIAGNOSIS — I4821 Permanent atrial fibrillation: Secondary | ICD-10-CM | POA: Diagnosis not present

## 2022-11-24 DIAGNOSIS — R0602 Shortness of breath: Secondary | ICD-10-CM | POA: Diagnosis not present

## 2022-11-24 DIAGNOSIS — I5032 Chronic diastolic (congestive) heart failure: Secondary | ICD-10-CM

## 2022-11-24 MED ORDER — AMLODIPINE BESYLATE 5 MG PO TABS: 5.0000 mg | ORAL_TABLET | Freq: Every day | ORAL | 3 refills | Status: DC

## 2022-11-24 NOTE — Patient Instructions (Signed)
Medication Instructions:  *If you need a refill on your cardiac medications before your next appointment, please call your pharmacy*   Lab Work: BNP, BMP, CBC If you have labs (blood work) drawn today and your tests are completely normal, you will receive your results only by: [place] (if you have MyChart) OR A paper copy in the mail If you have any lab test that is abnormal or we need to change your treatment, we will call you to review the results.   Testing/Procedures: A chest x-ray takes a picture of the organs and structures inside the chest, including the heart, lungs, and blood vessels. This test can show several things, including, whether the heart is enlarges; whether fluid is building up in the lungs; and whether pacemaker / defibrillator leads are still in place. [place]  Your physician has recommended that you have a pulmonary function test. Pulmonary Function Tests are a group of tests that measure how well air moves in and out of your lungs. [facility]   Follow-Up: At [facility], you and your health needs are our priority.  As part of our continuing mission to provide you with exceptional heart care, we have created designated Provider Care Teams.  These Care Teams include your primary Cardiologist (physician) and Advanced Practice Providers (APPs -  Physician Assistants and Nurse Practitioners) who all work together to provide you with the care you need, when you need it.  We recommend signing up for the patient portal called "MyChart".  Sign up information is provided on this After Visit Summary.  MyChart is used to connect with patients for Virtual Visits (Telemedicine).  Patients are able to view lab/test results, encounter notes, upcoming appointments, etc.  Non-urgent messages can be sent to your provider as well.   To learn more about what you can do with MyChart, go to [email or web address].     Your next appointment:   2 week(s)  Provider:   Minus Breeding, MD

## 2022-11-27 LAB — CBC
Hematocrit: 44.8 % (ref 37.5–51.0)
Hemoglobin: 14.3 g/dL (ref 13.0–17.7)
MCH: 28.9 pg (ref 26.6–33.0)
MCHC: 31.9 g/dL (ref 31.5–35.7)
MCV: 91 fL (ref 79–97)
Platelets: 155 10*3/uL (ref 150–450)
RBC: 4.95 x10E6/uL (ref 4.14–5.80)
RDW: 15.5 % — ABNORMAL HIGH (ref 11.6–15.4)
WBC: 10.5 10*3/uL (ref 3.4–10.8)

## 2022-11-27 LAB — BRAIN NATRIURETIC PEPTIDE: BNP: 1069.5 pg/mL — ABNORMAL HIGH (ref 0.0–100.0)

## 2022-11-27 LAB — BASIC METABOLIC PANEL
BUN/Creatinine Ratio: 12 (ref 10–24)
BUN: 31 mg/dL — ABNORMAL HIGH (ref 8–27)
CO2: 21 mmol/L (ref 20–29)
Calcium: 9.3 mg/dL (ref 8.6–10.2)
Chloride: 108 mmol/L — ABNORMAL HIGH (ref 96–106)
Creatinine, Ser: 2.49 mg/dL — ABNORMAL HIGH (ref 0.76–1.27)
Glucose: 102 mg/dL — ABNORMAL HIGH (ref 70–99)
Potassium: 4.6 mmol/L (ref 3.5–5.2)
Sodium: 142 mmol/L (ref 134–144)
eGFR: 26 mL/min/{1.73_m2} — ABNORMAL LOW (ref >59)

## 2022-12-01 ENCOUNTER — Encounter: Payer: Self-pay | Admitting: *Deleted

## 2022-12-01 ENCOUNTER — Other Ambulatory Visit: Payer: Self-pay | Admitting: *Deleted

## 2022-12-01 ENCOUNTER — Ambulatory Visit (HOSPITAL_COMMUNITY)
Admission: RE | Admit: 2022-12-01 | Discharge: 2022-12-01 | Disposition: A | Discharge status: Home / Self Care | Payer: Medicare PPO | Source: Ambulatory Visit | Attending: Cardiology | Admitting: Cardiology

## 2022-12-01 DIAGNOSIS — I25119 Atherosclerotic heart disease of native coronary artery with unspecified angina pectoris: Secondary | ICD-10-CM

## 2022-12-01 DIAGNOSIS — E785 Hyperlipidemia, unspecified: Secondary | ICD-10-CM | POA: Diagnosis not present

## 2022-12-01 DIAGNOSIS — I5032 Chronic diastolic (congestive) heart failure: Secondary | ICD-10-CM

## 2022-12-01 DIAGNOSIS — I10 Essential (primary) hypertension: Secondary | ICD-10-CM

## 2022-12-01 DIAGNOSIS — R0602 Shortness of breath: Secondary | ICD-10-CM | POA: Insufficient documentation

## 2022-12-01 DIAGNOSIS — I4821 Permanent atrial fibrillation: Secondary | ICD-10-CM | POA: Diagnosis not present

## 2022-12-01 LAB — PULMONARY FUNCTION TEST
DL/VA % pred: 75 %
DL/VA: 2.96 ml/min/mmHg/L
DLCO cor % pred: 60 %
DLCO cor: 15.71 ml/min/mmHg
DLCO unc % pred: 60 %
DLCO unc: 15.57 ml/min/mmHg
FEF 25-75 Post: 2.72 L/sec
FEF 25-75 Pre: 2.11 L/sec
FEF2575-%Change-Post: 29 %
FEF2575-%Pred-Post: 120 %
FEF2575-%Pred-Pre: 92 %
FEV1-%Change-Post: 3 %
FEV1-%Pred-Post: 90 %
FEV1-%Pred-Pre: 86 %
FEV1-Post: 2.85 L
FEV1-Pre: 2.74 L
FEV1FVC-%Change-Post: 5 %
FEV1FVC-%Pred-Pre: 104 %
FEV6-%Change-Post: -2 %
FEV6-%Pred-Post: 86 %
FEV6-%Pred-Pre: 88 %
FEV6-Post: 3.54 L
FEV6-Pre: 3.63 L
FEV6FVC-%Change-Post: 0 %
FEV6FVC-%Pred-Post: 106 %
FEV6FVC-%Pred-Pre: 106 %
FVC-%Change-Post: -1 %
FVC-%Pred-Post: 82 %
FVC-%Pred-Pre: 83 %
FVC-Post: 3.58 L
FVC-Pre: 3.64 L
Post FEV1/FVC ratio: 79 %
Post FEV6/FVC ratio: 100 %
Pre FEV1/FVC ratio: 75 %
Pre FEV6/FVC Ratio: 100 %
RV % pred: 88 %
RV: 2.33 L
TLC % pred: 84 %
TLC: 6.11 L

## 2022-12-01 MED ORDER — ALBUTEROL SULFATE (2.5 MG/3ML) 0.083% IN NEBU
2.5000 mg | INHALATION_SOLUTION | Freq: Once | RESPIRATORY_TRACT | Status: AC
  Administered 2022-12-01: 2.5 mg via RESPIRATORY_TRACT

## 2022-12-07 NOTE — Progress Notes (Signed)
Cardiology Office Note   Date:  12/08/2022   ID:  Adam Harris, Adam Harris 09-11-1946, MRN [id number]  PCP:  Haydee Salter, MD  Cardiologist:   Minus Breeding, MD    Chief Complaint  Patient presents with   Shortness of Breath      History of Present Illness: Adam Harris is a 77 y.o. male who presents for atrial fib and CAD and atrial fibrillation.  Most recent cardiac cath  01/2014 revealed severe 1 vessel disease with CTO RCA with faint left to right collaterals, aneurysmal LCFx with sluggish coronary flow;  normal LVSF w/ moderately elevated LVEDP (ostialOM2 20%, pOM3 20%, pD1 20%, mCFX 50%, diffuse 20% pCFX). Chronic diastolic CHF, atrial fib and HL.   He had increased leg swelling and echo demonstrated a normal EF.  There were no significant valvular abnormalities and only mild pulmonary hypertension.   BNP was markedly elevated.    PFTs demonstrated no diagnostic findings.  At the time I saw him he was not taking his Lasix routinely.  He has started since taking manage 40 mg daily and his breathing is much improved.  His weight is down about 11 pounds.  He denies chest pressure, neck or arm discomfort.  His lower extremity edema is improved.  States he is on the following   Past Medical History:  Diagnosis Date   Abnormal radiologic findings on diagnostic imaging of renal pelvis, ureter, or bladder    bilateral ureter abnormalities   Anticoagulant long-term use    eliquis   Anxiety    Arthritis    CAD (coronary artery disease) cardiologist-- dr Liddy Deam   NSTEMI 02-04-2014  per cardiac cath chronic occluded RCA w/ faint left-to-right collaterals and aneurysmal LCFx with sluggish coronary flow/   NSTEMI --11-21-2016 per cardiac cath occluded proximal RCA & mid to diastal CFX 100%, med rx. If that does not work, PTCA or CABG   Cancer [facility])    bladder and kidney   CKD (chronic kidney disease), stage III ([place])    patient unaware   DOE (dyspnea on exertion)    Fatty liver    pt  denies   Hematuria 02/2019   History of COVID-19 10/2019   History of non-ST elevation myocardial infarction (NSTEMI)    02-04-2014  and 11-21-2016  cardiac cath done both times ,  medically management   History of shingles 12/2017   slight pain and numbness still noted in the area   Hyperlipidemia    Hypertension    Insomnia    Myocardial infarction [facility]) 2015   Persistent atrial fibrillation [facility])    cardiologsit-- dr Klyn Kroening   Thoracic aortic atherosclerosis ([place])    Type 2 diabetes mellitus ([place])    Urinary frequency     Past Surgical History:  Procedure Laterality Date   CARDIAC CATHETERIZATION N/A 11/21/2016   Procedure: Left Heart Cath and Coronary Angiography;  Surgeon: Troy Sine, MD;  Location: [place] CV LAB;  Service: Cardiovascular;  Laterality: N/A;  pRCA 100% , ostial LAD 45%, OM3 80%, mCFX to dCFX 100% (AV groove), lateral OM3 50%   COLONOSCOPY     CYSTOSCOPY WITH BIOPSY N/A 08/12/2020   Procedure: CYSTOSCOPY WITH BLADDER BIOPSY AND TRANSURETHRAL RESECTION OF BLADDER TUMOR;  Surgeon: [name], MD;  Location: [facility];  Service: Urology;  Laterality: N/A;   CYSTOSCOPY WITH BIOPSY N/A 11/10/2021   Procedure: CYSTOSCOPY WITH BLADDER BIOPSIES/ LEFT RETROGRADE;  Surgeon: [name], MD;  Location: [facility];  Service: Urology;  Laterality: N/A;   CYSTOSCOPY WITH BIOPSY Left 07/27/2022   Procedure: CYSTOSCOPY WITH  BLADDER BIOPSY, TRANSURETHRAL FULGERATION OF BLADDER, EXAM UNDER ANESTHESIA, LEFT RETROGRADE PYELOGRAM;  Surgeon: [name], MD;  Location: [facility];  Service: Urology;  Laterality: Left;   CYSTOSCOPY WITH URETEROSCOPY AND STENT PLACEMENT Right 12/15/2019   Procedure: CYSTOSCOPY WITH RIGHT RETROGRADE/ RIGHT URETEROSCOPY/ BIOPSY;  Surgeon: [name], MD;  Location: [place];  Service: Urology;  Laterality: Right;   CYSTOSCOPY/RETROGRADE/URETEROSCOPY Bilateral 03/18/2018   Procedure: CYSTOSCOPY/RETROGRADE/URETEROSCOPY.;   Surgeon: [name], MD;  Location: [facility];  Service: Urology;  Laterality: Bilateral;   EYE SURGERY Bilateral    cateract in January 2023   HYDROCELE EXCISION Left 07/27/2022   Procedure: HYDROCELE REPAIR;  Surgeon: [name], MD;  [facility];  Service: Urology;  Laterality: Left;  GENERAL ANESTHESIA WITH PARALYSIS   KNEE ARTHROSCOPY Right    LEFT HEART CATHETERIZATION WITH CORONARY ANGIOGRAM N/A 02/04/2014   Procedure: LEFT HEART CATHETERIZATION WITH CORONARY ANGIOGRAM;  Surgeon: [name], MD;  Location: [place] CATH LAB;  Service: Cardiovascular;  Laterality: N/A;  severe one-vessel CAD, chronically occluded RCA with faint left-to-right collaterals;  aneurysmal LCFx with sluggish coronary flow;  normal LVSF w/ moderately elevated LVEDP (ostialOM2 20%, pOM3 20%, pD1 20%, mCFX 50%, diffuse 20% pCFX)   PROSTATE BIOPSY N/A 08/12/2020   Procedure: BIOPSY TRANSRECTAL ULTRASONIC PROSTATE (TUBP);  Surgeon: [name], MD;  [facility];  Service: Urology;  Laterality: N/A;   ROBOT ASSITED LAPAROSCOPIC NEPHROURETERECTOMY Right 03/22/2020   Procedure: XI ROBOT ASSITED LAPAROSCOPIC NEPHROURETERECTOMY;  Surgeon: [name], MD;  [facility];  Service: Urology;  Laterality: Right;   TRANSTHORACIC ECHOCARDIOGRAM  11-22-2016   dr Carrson Lightcap   ef 55-60%/ mild MR and TR/ moderate LAE   TRANSURETHRAL RESECTION OF BLADDER TUMOR N/A 02/10/2021   Procedure: TRANSURETHRAL RESECTION OF BLADDER TUMOR (TURBT)/ CYSTOSCOPY/ LEFT RETROGRADE;  Surgeon: [name], MD;  [facility];  Service: Urology;  Laterality: N/A;  GENERAL ANESTHESIA WITH PARALYSIS     Current Outpatient Medications  Medication Sig Dispense Refill   amLODipine (NORVASC) 5 MG tablet Take 1 tablet (5 mg total) by mouth daily. 90 tablet 3   apixaban (ELIQUIS) 5 MG TABS tablet Take 5 mg by mouth 2 (two) times daily.     diphenhydramine-acetaminophen (TYLENOL PM) 25-500 MG TABS tablet Take 1  tablet by mouth at bedtime as needed (pain/sleep).     finasteride (PROSCAR) 5 MG tablet Take 5 mg by mouth daily.     isosorbide mononitrate (IMDUR) 30 MG 24 hr tablet Take 1 tablet (30 mg total) by mouth daily. 90 tablet 1   metoprolol tartrate (LOPRESSOR) 25 MG tablet TAKE ONE TABLET BY MOUTH TWICE A DAY 180 tablet 0   nitroGLYCERIN (NITROSTAT) 0.4 MG SL tablet TAKE ONE TABLET UNDER THE TONGUE EVERY FIVE MINUTES FOR THREE DOSES AS NEEDED FOR CHEST PAIN, CALL 911 IF 2ND DOSE DOESN'T HELP 25 tablet 4   olmesartan (BENICAR) 20 MG tablet TAKE ONE TABLET BY MOUTH DAILY (Patient taking differently: Take 40 mg by mouth daily.) 90 tablet 0   rosuvastatin (CRESTOR) 40 MG tablet Take 1 tablet (40 mg total) by mouth every morning. Please keep scheduled appointment 90 tablet 3   sitaGLIPtin (JANUVIA) 25 MG tablet Take 1 tablet (25 mg total) by mouth daily. 90 tablet 3   furosemide (LASIX) 20 MG tablet Take 1 tablet (20 mg total) by mouth daily. 90 tablet  3   No current facility-administered medications for this visit.    Allergies:   Xarelto [rivaroxaban]    ROS:  Please see the history of present illness.   Otherwise, review of systems are negative for none.   All other systems are reviewed and negative.    PHYSICAL EXAM: VS:  BP 130/88 (BP Location: Right Arm, Patient Position: Sitting, Cuff Size: Large)   Pulse 61   Ht '5\' 11"'$  (1.803 m)   Wt 216 lb (98 kg)   BMI 30.13 kg/m  , BMI Body mass index is 30.13 kg/m.  GENERAL:  Well appearing NECK:  No jugular venous distention, waveform within normal limits, carotid upstroke brisk and symmetric, no bruits, no thyromegaly LUNGS:  Clear to auscultation bilaterally CHEST:  Unremarkable HEART:  PMI not displaced or sustained,S1 and S2 within normal limits, no S3, no o clicks, no rubs, no murmurs, irregularEXT:  2 plus pulses throughout, no edema, no cyanosis no clubbing   EKG:  EKG is  ordered today. Atrial fibrillation, rate 61, nonspecific ST-T  wave changes.   Recent Labs: 05/02/2022: TSH 4.75 07/25/2022: ALT 14 11/24/2022: BNP 1,069.5; BUN 31; Creatinine, Ser 2.49; Hemoglobin 14.3; Platelets 155; Potassium 4.6; Sodium 142    Lipid Panel    Component Value Date/Time   CHOL 100 05/02/2022 1105   CHOL 149 06/25/2019 1621   TRIG 51.0 05/02/2022 1105   HDL 35.80 (L) 05/02/2022 1105   HDL 43 06/25/2019 1621   CHOLHDL 3 05/02/2022 1105   VLDL 10.2 05/02/2022 1105   LDLCALC 54 05/02/2022 1105   LDLCALC 88 06/25/2019 1621      Wt Readings from Last 3 Encounters:  12/08/22 216 lb (98 kg)  11/24/22 227 lb 3.2 oz (103.1 kg)  10/11/22 226 lb 12.8 oz (102.9 kg)      Other studies Reviewed: Additional studies/ records that were reviewed today include: PFTs Review of the above records demonstrates:   See elsewhere   ASSESSMENT AND PLAN:  PERMANENT ATRIAL FIB:  Mr. Willet Schleifer has a CHA2DS2 - VASc score of 3.  We have held off on anticoagulation because he has had hematuria.  He is supposed to follow-up with urology prior to starting this.  He said he took a couple doses the other day and started bleeding.  I encouraged follow-up and then getting back to me if he is cleared to restart.  CAD:  The patient has no new sypmtoms.  No further cardiovascular testing is indicated.  We will continue with aggressive risk reduction and meds as listed.  HTN:  The blood pressure is at target.  No change in therapy.   CHRONIC DIASTOLIC HF: He seems to be euvolemic now.  Walking around the office his sats stayed at 90% which was better.  No change in therapy.  He is going to go down to 20 mg of Lasix daily and take the 40 mg only as needed.  We will have to keep a close eye on his renal function.    DYSLIPIDEMIA:    LDL was 54.  No change in therapy.  DM: His A1c was 7.5.  No change in therapy.   CKD IV:   Creatinine was 2.49 couple of weeks ago.  I will check this again today.   SOB:   This is much improved with the diuretic.  At this  point I do not see indication for right heart cath in.  I do think his issue was diastolic dysfunction.  Current  medicines are reviewed at length with the patient today.  The patient does not have concerns regarding medicines.  The following changes have been made:  None  Labs/ tests ordered today include:   Orders Placed This Encounter  Procedures   Basic Metabolic Panel (BMET)   EKG 12-Lead     Disposition:   FU with me or APP in two months.   Signed, Minus Breeding, MD  12/08/2022 2:23 PM    [facility]

## 2022-12-08 ENCOUNTER — Ambulatory Visit: Payer: Medicare PPO | Attending: Cardiology | Admitting: Cardiology

## 2022-12-08 ENCOUNTER — Encounter: Payer: Self-pay | Admitting: Cardiology

## 2022-12-08 VITALS — BP 130/88 | HR 61 | Ht 71.0 in | Wt 216.0 lb

## 2022-12-08 DIAGNOSIS — I2510 Atherosclerotic heart disease of native coronary artery without angina pectoris: Secondary | ICD-10-CM | POA: Diagnosis not present

## 2022-12-08 DIAGNOSIS — I10 Essential (primary) hypertension: Secondary | ICD-10-CM | POA: Diagnosis not present

## 2022-12-08 DIAGNOSIS — R0602 Shortness of breath: Secondary | ICD-10-CM | POA: Diagnosis not present

## 2022-12-08 DIAGNOSIS — N289 Disorder of kidney and ureter, unspecified: Secondary | ICD-10-CM | POA: Diagnosis not present

## 2022-12-08 DIAGNOSIS — I4821 Permanent atrial fibrillation: Secondary | ICD-10-CM | POA: Diagnosis not present

## 2022-12-08 DIAGNOSIS — E785 Hyperlipidemia, unspecified: Secondary | ICD-10-CM | POA: Diagnosis not present

## 2022-12-08 MED ORDER — FUROSEMIDE 20 MG PO TABS: 20.0000 mg | ORAL_TABLET | Freq: Every day | ORAL | 3 refills | Status: DC

## 2022-12-08 NOTE — Patient Instructions (Addendum)
Medication Instructions:  Your physician has recommended you make the following change in your medication:  DECREASE: Lasix to '20mg'$  daily.  *If you need a refill on your cardiac medications before your next appointment, please call your pharmacy*   Lab Work: Your physician recommends that you have the following lab drawn today: BMET  If you have labs (blood work) drawn today and your tests are completely normal, you will receive your results only by: MyChart Message (if you have MyChart) OR A paper copy in the mail If you have any lab test that is abnormal or we need to change your treatment, we will call you to review the results.   Testing/Procedures: NONE    Follow-Up: At [facility], you and your health needs are our priority.  As part of our continuing mission to provide you with exceptional heart care, we have created designated Provider Care Teams.  These Care Teams include your primary Cardiologist (physician) and Advanced Practice Providers (APPs -  Physician Assistants and Nurse Practitioners) who all work together to provide you with the care you need, when you need it.  We recommend signing up for the patient portal called "MyChart".  Sign up information is provided on this After Visit Summary.  MyChart is used to connect with patients for Virtual Visits (Telemedicine).  Patients are able to view lab/test results, encounter notes, upcoming appointments, etc.  Non-urgent messages can be sent to your provider as well.   To learn more about what you can do with MyChart, go to [email or web address].    Your next appointment:   4 month(s)  Provider:   Coletta Memos, FNP, Fabian Sharp, PA-C, Jory Sims, DNP, ANP, or Almyra Deforest, PA-C

## 2022-12-09 LAB — BASIC METABOLIC PANEL
BUN/Creatinine Ratio: 17 (ref 10–24)
BUN: 38 mg/dL — ABNORMAL HIGH (ref 8–27)
CO2: 20 mmol/L (ref 20–29)
Calcium: 9.6 mg/dL (ref 8.6–10.2)
Chloride: 107 mmol/L — ABNORMAL HIGH (ref 96–106)
Creatinine, Ser: 2.18 mg/dL — ABNORMAL HIGH (ref 0.76–1.27)
Glucose: 157 mg/dL — ABNORMAL HIGH (ref 70–99)
Potassium: 4.7 mmol/L (ref 3.5–5.2)
Sodium: 142 mmol/L (ref 134–144)
eGFR: 31 mL/min/{1.73_m2} — ABNORMAL LOW (ref >59)

## 2022-12-15 ENCOUNTER — Encounter: Payer: Self-pay | Admitting: *Deleted

## 2022-12-19 ENCOUNTER — Telehealth: Payer: Self-pay | Admitting: Cardiology

## 2022-12-19 ENCOUNTER — Other Ambulatory Visit: Payer: Self-pay | Admitting: Cardiology

## 2022-12-19 NOTE — Telephone Encounter (Signed)
*  STAT* If patient is at the pharmacy, call can be transferred to refill team.   1. Which medications need to be refilled? (please list name of each medication and dose if known) metoprolol tartrate (LOPRESSOR) 25 MG tablet   2. Which pharmacy/location (including street and city if local pharmacy) is medication to be sent to? [place], [place]   3. Do they need a 30 day or 90 day supply? [place]

## 2022-12-20 MED ORDER — METOPROLOL TARTRATE 25 MG PO TABS: 25.0000 mg | ORAL_TABLET | Freq: Two times a day (BID) | ORAL | 0 refills | Status: DC

## 2022-12-27 DIAGNOSIS — N401 Enlarged prostate with lower urinary tract symptoms: Secondary | ICD-10-CM | POA: Diagnosis not present

## 2022-12-27 DIAGNOSIS — R350 Frequency of micturition: Secondary | ICD-10-CM | POA: Diagnosis not present

## 2022-12-27 DIAGNOSIS — R310 Gross hematuria: Secondary | ICD-10-CM | POA: Diagnosis not present

## 2022-12-27 DIAGNOSIS — C678 Malignant neoplasm of overlapping sites of bladder: Secondary | ICD-10-CM | POA: Diagnosis not present

## 2023-01-11 DIAGNOSIS — Z905 Acquired absence of kidney: Secondary | ICD-10-CM | POA: Diagnosis not present

## 2023-01-11 DIAGNOSIS — I2510 Atherosclerotic heart disease of native coronary artery without angina pectoris: Secondary | ICD-10-CM | POA: Diagnosis not present

## 2023-01-11 DIAGNOSIS — I129 Hypertensive chronic kidney disease with stage 1 through stage 4 chronic kidney disease, or unspecified chronic kidney disease: Secondary | ICD-10-CM | POA: Diagnosis not present

## 2023-01-11 DIAGNOSIS — N390 Urinary tract infection, site not specified: Secondary | ICD-10-CM | POA: Diagnosis not present

## 2023-01-11 DIAGNOSIS — E1122 Type 2 diabetes mellitus with diabetic chronic kidney disease: Secondary | ICD-10-CM | POA: Diagnosis not present

## 2023-01-11 DIAGNOSIS — N1832 Chronic kidney disease, stage 3b: Secondary | ICD-10-CM | POA: Diagnosis not present

## 2023-01-11 DIAGNOSIS — R809 Proteinuria, unspecified: Secondary | ICD-10-CM | POA: Diagnosis not present

## 2023-01-11 DIAGNOSIS — N400 Enlarged prostate without lower urinary tract symptoms: Secondary | ICD-10-CM | POA: Diagnosis not present

## 2023-01-15 ENCOUNTER — Other Ambulatory Visit: Payer: Self-pay | Admitting: Family Medicine

## 2023-01-15 DIAGNOSIS — I10 Essential (primary) hypertension: Secondary | ICD-10-CM

## 2023-01-29 DIAGNOSIS — N1832 Chronic kidney disease, stage 3b: Secondary | ICD-10-CM | POA: Diagnosis not present

## 2023-01-30 DIAGNOSIS — R300 Dysuria: Secondary | ICD-10-CM | POA: Diagnosis not present

## 2023-02-13 ENCOUNTER — Telehealth: Payer: Self-pay | Admitting: Family Medicine

## 2023-02-13 NOTE — Telephone Encounter (Signed)
Pt would like a script for Volterin for his pain.

## 2023-02-14 NOTE — Telephone Encounter (Signed)
Unable to leave VM due to mail box is full.  Dm/cma  

## 2023-02-15 NOTE — Telephone Encounter (Signed)
Spoke to patient's wife, she would like to get a prescription for the Voltaren tabs due due patients arthrotic  pain.   Please review and advise.  Thanks. Dm/cma

## 2023-02-16 NOTE — Telephone Encounter (Signed)
Unable to leave VM due to mail box is full.  Dm/cma  

## 2023-02-20 NOTE — Telephone Encounter (Signed)
Unable to leave VM due to mail box is full.  Dm/cma  

## 2023-02-22 DIAGNOSIS — C678 Malignant neoplasm of overlapping sites of bladder: Secondary | ICD-10-CM | POA: Diagnosis not present

## 2023-02-22 NOTE — Telephone Encounter (Signed)
Unable to leave VM due to mail box is full.  Dm/cma  

## 2023-02-23 ENCOUNTER — Other Ambulatory Visit: Payer: Self-pay | Admitting: Urology

## 2023-02-23 DIAGNOSIS — C641 Malignant neoplasm of right kidney, except renal pelvis: Secondary | ICD-10-CM | POA: Diagnosis not present

## 2023-02-23 DIAGNOSIS — C678 Malignant neoplasm of overlapping sites of bladder: Secondary | ICD-10-CM | POA: Diagnosis not present

## 2023-02-23 DIAGNOSIS — Z8551 Personal history of malignant neoplasm of bladder: Secondary | ICD-10-CM | POA: Diagnosis not present

## 2023-02-26 ENCOUNTER — Encounter: Payer: Self-pay | Admitting: *Deleted

## 2023-02-28 DIAGNOSIS — C61 Malignant neoplasm of prostate: Secondary | ICD-10-CM | POA: Diagnosis not present

## 2023-02-28 DIAGNOSIS — C678 Malignant neoplasm of overlapping sites of bladder: Secondary | ICD-10-CM | POA: Diagnosis not present

## 2023-02-28 DIAGNOSIS — C651 Malignant neoplasm of right renal pelvis: Secondary | ICD-10-CM | POA: Diagnosis not present

## 2023-02-28 DIAGNOSIS — R8271 Bacteriuria: Secondary | ICD-10-CM | POA: Diagnosis not present

## 2023-03-01 ENCOUNTER — Other Ambulatory Visit: Payer: Self-pay | Admitting: Urology

## 2023-03-01 DIAGNOSIS — C678 Malignant neoplasm of overlapping sites of bladder: Secondary | ICD-10-CM

## 2023-03-20 ENCOUNTER — Encounter: Payer: Self-pay | Admitting: Cardiology

## 2023-03-20 ENCOUNTER — Telehealth: Payer: Self-pay | Admitting: Family Medicine

## 2023-03-20 NOTE — Telephone Encounter (Signed)
Pt called to ask about he and his wife's appts. During the conversation he stopped and started talking to his wife. He asked a question I replied with is that question for me? He said I guess not sorry I bothered you and hung up. I wasn't sure if he was still talking to his wife.

## 2023-03-21 NOTE — Telephone Encounter (Signed)
Noted. Dm/cma  

## 2023-03-22 ENCOUNTER — Ambulatory Visit
Admission: RE | Admit: 2023-03-22 | Discharge: 2023-03-22 | Disposition: A | Discharge status: Home / Self Care | Payer: Medicare PPO | Source: Ambulatory Visit | Attending: Urology | Admitting: Urology

## 2023-03-22 DIAGNOSIS — N184 Chronic kidney disease, stage 4 (severe): Secondary | ICD-10-CM | POA: Insufficient documentation

## 2023-03-22 DIAGNOSIS — C679 Malignant neoplasm of bladder, unspecified: Secondary | ICD-10-CM | POA: Diagnosis not present

## 2023-03-22 DIAGNOSIS — C678 Malignant neoplasm of overlapping sites of bladder: Secondary | ICD-10-CM

## 2023-03-22 DIAGNOSIS — E119 Type 2 diabetes mellitus without complications: Secondary | ICD-10-CM | POA: Diagnosis not present

## 2023-03-22 DIAGNOSIS — I10 Essential (primary) hypertension: Secondary | ICD-10-CM | POA: Diagnosis not present

## 2023-03-22 DIAGNOSIS — N289 Disorder of kidney and ureter, unspecified: Secondary | ICD-10-CM | POA: Diagnosis not present

## 2023-03-22 NOTE — Progress Notes (Signed)
Cardiology Office Note:   Date:  03/23/2023  ID:  Riggen Raba, DOB [birth year], MRN [id number]  History of Present Illness:   Adam Harris is a 77 y.o. male who presents for atrial fib and CAD and atrial fibrillation.  Most recent cardiac cath  01/2014 revealed severe 1 vessel disease with CTO RCA with faint left to right collaterals, aneurysmal LCFx with sluggish coronary flow;  normal LVSF w/ moderately elevated LVEDP (ostialOM2 20%, pOM3 20%, pD1 20%, mCFX 50%, diffuse 20% pCFX). Chronic diastolic CHF, atrial fib and HL.   He had well preserved EF on echo July 2023.    Since I last saw him he had no new cardiovascular complaints.  He has had increased lower extremity swelling.  He is not having any new shortness of breath, PND or orthopnea.  He is not having any palpitations, presyncope or syncope.  He denies any chest pressure, neck or arm discomfort.  Note he has not been taking his Eliquis because he has had to have urologic instrumentation and he bleeds after that and says sometimes he has spontaneous hematuria so he just stopped the Eliquis.  ROS: As stated in the HPI and negative for all other systems.  Studies Reviewed:    EKG: Atrial fibrillation, rate 61, axis within normal limits, intervals within normal limits, 12/08/2022   Risk Assessment/Calculations:    CHA2DS2-VASc Score = 6   This indicates a 9.7% annual risk of stroke. The patient's score is based upon:   Physical Exam:   VS:  BP (!) 142/90   Pulse 63   Ht 5\' 11"  (1.803 m)   Wt 227 lb (103 kg)   SpO2 97%   BMI 31.66 kg/m    Wt Readings from Last 3 Encounters:  03/23/23 227 lb (103 kg)  12/08/22 216 lb (98 kg)  11/24/22 227 lb 3.2 oz (103.1 kg)     GEN: Well nourished, well developed in no acute distress NECK: No JVD; No carotid bruits CARDIAC: Irregular RR, no murmurs, rubs, gallops RESPIRATORY:  Clear to auscultation without rales, wheezing or rhonchi  ABDOMEN: Soft, non-tender, non-distended EXTREMITIES: Moderate  bilateral leg edema edema; No deformity   ASSESSMENT AND PLAN:   CHRONIC ATRIAL FIB: I was going to refer him for watchman.  However, he would like to try himself back on the Eliquis and see if he feels that again and if he has recurrent hematuria then would consider referral for watchman.   CAD:  The patient has no new sypmtoms.  No further cardiovascular testing is indicated.  We will continue with aggressive risk reduction and meds as listed.  HTN:  The blood pressure is mildly elevated.  No change in therapy at this point.  He can watch this at home and let me know if he is not running in the 120s over 70s on might make med adjustments.   CHRONIC DIASTOLIC HF: I do think he has some excess volume.  Has not been taking his Lasix.  I am going to have him do that at least for 3 days and get compression stockings and keep his feet elevated.  DYSLIPIDEMIA:    LDL was 54 in June 2023.  He should come back for fasting lipid profile.   DM: His A1c is 7.5 and he is having this managed by his primary provider.  CKD IV: Creatinine was 2.0 which is stable.  He is followed by nephrology.        Signed, Rollene Rotunda, MD

## 2023-03-23 ENCOUNTER — Ambulatory Visit: Payer: Medicare PPO | Attending: Cardiology | Admitting: Cardiology

## 2023-03-23 ENCOUNTER — Encounter: Payer: Self-pay | Admitting: Cardiology

## 2023-03-23 VITALS — BP 142/90 | HR 63 | Ht 71.0 in | Wt 227.0 lb

## 2023-03-23 DIAGNOSIS — N184 Chronic kidney disease, stage 4 (severe): Secondary | ICD-10-CM

## 2023-03-23 DIAGNOSIS — I482 Chronic atrial fibrillation, unspecified: Secondary | ICD-10-CM | POA: Diagnosis not present

## 2023-03-23 DIAGNOSIS — I25119 Atherosclerotic heart disease of native coronary artery with unspecified angina pectoris: Secondary | ICD-10-CM | POA: Diagnosis not present

## 2023-03-23 DIAGNOSIS — E1159 Type 2 diabetes mellitus with other circulatory complications: Secondary | ICD-10-CM

## 2023-03-23 DIAGNOSIS — E785 Hyperlipidemia, unspecified: Secondary | ICD-10-CM

## 2023-03-23 NOTE — Patient Instructions (Addendum)
TAKE FUROSEMIDE 20 MG FOR 3 DAYS  Your physician recommends that you return for lab work FASTING   Follow-Up: At [facility], you and your health needs are our priority.  As part of our continuing mission to provide you with exceptional heart care, we have created designated Provider Care Teams.  These Care Teams include your primary Cardiologist (physician) and Advanced Practice Providers (APPs -  Physician Assistants and Nurse Practitioners) who all work together to provide you with the care you need, when you need it.  We recommend signing up for the patient portal called "MyChart".  Sign up information is provided on this After Visit Summary.  MyChart is used to connect with patients for Virtual Visits (Telemedicine).  Patients are able to view lab/test results, encounter notes, upcoming appointments, etc.  Non-urgent messages can be sent to your provider as well.   To learn more about what you can do with MyChart, go to [email or web address].    Your next appointment:   6 month(s)  Provider:   Rollene Rotunda, MD

## 2023-03-29 ENCOUNTER — Other Ambulatory Visit: Payer: Medicare PPO

## 2023-03-31 ENCOUNTER — Inpatient Hospital Stay
Admission: RE | Admit: 2023-03-31 | Discharge: 2023-03-31 | Disposition: A | Discharge status: Home / Self Care | Payer: Medicare PPO | Source: Ambulatory Visit | Attending: Family Medicine | Admitting: Family Medicine

## 2023-04-03 DIAGNOSIS — E785 Hyperlipidemia, unspecified: Secondary | ICD-10-CM | POA: Diagnosis not present

## 2023-04-04 ENCOUNTER — Encounter: Payer: Self-pay | Admitting: *Deleted

## 2023-04-04 LAB — LIPID PANEL
Chol/HDL Ratio: 2.8 ratio (ref 0.0–5.0)
Cholesterol, Total: 89 mg/dL — ABNORMAL LOW (ref 100–199)
HDL: 32 mg/dL — ABNORMAL LOW (ref >39)
LDL Chol Calc (NIH): 45 mg/dL (ref 0–99)
Triglycerides: 49 mg/dL (ref 0–149)
VLDL Cholesterol Cal: 12 mg/dL (ref 5–40)

## 2023-04-05 ENCOUNTER — Telehealth: Payer: Self-pay | Admitting: Cardiology

## 2023-04-05 ENCOUNTER — Other Ambulatory Visit: Payer: Self-pay | Admitting: Urology

## 2023-04-05 NOTE — Telephone Encounter (Signed)
Pharmacy please advise on holding Eliquis prior to cystoscopy scheduled for 04/23/2023. Thank you.

## 2023-04-05 NOTE — Telephone Encounter (Signed)
   Pre-operative Risk Assessment    Patient Name: Adam Harris  DOB: 03-29-1946 MRN: [id number]      Request for Surgical Clearance    Procedure:  CYSTOSCOPY WITH BLADDER AND PROSTATIC URETHRAL BIOPSIES   Date of Surgery:  Clearance 04/23/23                                 Surgeon:  Dr. Heloise Purpura  Surgeon's Group or Practice Name:  [facility]  Phone number:  [phone]  Fax number:  [phone]   Type of Clearance Requested:   - Medical  - Pharmacy:  Hold Apixaban (Eliquis) 48-72 hr hold    Type of Anesthesia:  General    Additional requests/questions:    Signed, April Henson   04/05/2023, 11:07 AM

## 2023-04-05 NOTE — Telephone Encounter (Signed)
Patient with diagnosis of afib on Eliquis for anticoagulation.    Procedure: cystoscopy with biopsy Date of procedure: 04/23/23  CHA2DS2-VASc Score = 6  This indicates a 9.7% annual risk of stroke. The patient's score is based upon: CHF History: 1 HTN History: 1 Diabetes History: 1 Stroke History: 0 Vascular Disease History: 1 Age Score: 2 Gender Score: 0   CrCl 29mL/min Platelet count 155K  Per office protocol, patient can hold Eliquis for 2-3 days prior to procedure.    **This guidance is not considered finalized until pre-operative APP has relayed final recommendations.**

## 2023-04-05 NOTE — Telephone Encounter (Signed)
   Patient Name: Adam Harris  DOB: 07-27-1946 MRN: [id number]  Primary Cardiologist: Rollene Rotunda, MD  Chart reviewed as part of pre-operative protocol coverage. Given past medical history and time since last visit, based on ACC/AHA guidelines, [name] is at acceptable risk for the planned procedure without further cardiovascular testing.  Patient was seen in the office by Dr. Antoine Poche and required no additional cardiovascular testing at this time.  The patient was advised that if he develops new symptoms prior to surgery to contact our office to arrange for a follow-up visit, and he verbalized understanding.  Per office protocol, patient can hold Eliquis for 2-3 days prior to procedure.   I will route this recommendation to the requesting party via Epic fax function and remove from pre-op pool.  Please call with questions.  Napoleon Form, Leodis Rains, NP 04/05/2023, 12:26 PM

## 2023-04-06 ENCOUNTER — Ambulatory Visit (INDEPENDENT_AMBULATORY_CARE_PROVIDER_SITE_OTHER): Payer: Medicare PPO | Admitting: Family Medicine

## 2023-04-06 ENCOUNTER — Encounter: Payer: Self-pay | Admitting: Family Medicine

## 2023-04-06 VITALS — BP 132/80 | HR 82 | Temp 97.6°F | Ht 71.0 in | Wt 225.4 lb

## 2023-04-06 DIAGNOSIS — R399 Unspecified symptoms and signs involving the genitourinary system: Secondary | ICD-10-CM | POA: Diagnosis not present

## 2023-04-06 DIAGNOSIS — L02414 Cutaneous abscess of left upper limb: Secondary | ICD-10-CM | POA: Diagnosis not present

## 2023-04-06 DIAGNOSIS — I4891 Unspecified atrial fibrillation: Secondary | ICD-10-CM | POA: Diagnosis not present

## 2023-04-06 MED ORDER — MUPIROCIN CALCIUM 2 % EX CREA
1.0000 | TOPICAL_CREAM | Freq: Two times a day (BID) | CUTANEOUS | 0 refills | Status: DC
Start: 2023-04-06 — End: 2023-11-05

## 2023-04-06 MED ORDER — TAMSULOSIN HCL 0.4 MG PO CAPS
0.4000 mg | ORAL_CAPSULE | Freq: Every day | ORAL | 3 refills | Status: DC
Start: 2023-04-06 — End: 2023-08-30

## 2023-04-06 NOTE — Assessment & Plan Note (Signed)
I will try adding tamsulosin to Adam Harris finasteride to see if the combination is more effective for him.

## 2023-04-06 NOTE — Progress Notes (Signed)
[facility] [address] [place] [address] Dept: [phone] Dept Fax: [phone]  Office Visit  Subjective:    Patient ID: Adam Harris, male    DOB: 1946-03-31, 77 y.o..   MRN: [id number]  Chief Complaint  Patient presents with   Sore    C/o having 2 red areas on the LT arm x 4-5 days.      History of Present Illness:  Patient is in today who presents with a concern for bug bites on his arm. He states he thinks they may have bed bugs in their home. He has developed two raised lesions on the arm over the past week.  Additionally, he notes he continues to not sleep well due to frequent urination. He is on finasteride 5 mg daily, but has not noted significant improvements with this. He does have a history of prostate cancer and has had noninvasive bladder cancer. he sees urology for this.  Adam Harris notes his apixiban has been stopped due to bleeding concerns. His cardiologist plans for him to have a Watchman device placed.   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   Chronic diastolic CHF (congestive heart failure), NYHA class 2 (HCC) 04/02/2017   Atrial fibrillation with RVR (HCC) 11/20/2016   OA (osteoarthritis) of knee 05/31/2016   Low back pain 05/31/2016   Exertional angina 10/29/2014   Chronic kidney disease, stage 3b (HCC) 10/29/2014   Coronary artery disease involving native coronary artery of native heart with angina  pectoris (HCC) 03/09/2014   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/2014   Hyperlipidemia 02/05/2014   History of non-ST elevation myocardial infarction (NSTEMI) 02/03/2014   Essential hypertension 02/03/2014   Anxiety state 04/19/2013   History of prostate cancer 04/19/2013   Insomnia 04/19/2013   Stiffness of joint, lower leg 04/19/2013   Sciatica 04/19/2013   Past Surgical History:  Procedure Laterality Date   CARDIAC CATHETERIZATION N/A 11/21/2016   Procedure: Left Heart Cath and Coronary Angiography;  Surgeon: Lennette Bihari, MD;  Location: [facility];  Service: Cardiovascular;  Laterality: N/A;  pRCA 100% , ostial LAD 45%, OM3 80%, mCFX to dCFX 100% (AV groove), lateral OM3 50%   COLONOSCOPY     CYSTOSCOPY WITH BIOPSY N/A 08/12/2020   Procedure: CYSTOSCOPY WITH BLADDER BIOPSY AND TRANSURETHRAL RESECTION OF BLADDER TUMOR;  Surgeon: Heloise Purpura, MD;  Location: [facility];  Service: Urology;  Laterality: N/A;   CYSTOSCOPY WITH BIOPSY N/A 11/10/2021   Procedure: CYSTOSCOPY WITH BLADDER BIOPSIES/ LEFT RETROGRADE;  Surgeon: Heloise Purpura, MD;  Location: [facility];  Service: Urology;  Laterality: N/A;   CYSTOSCOPY WITH BIOPSY Left 07/27/2022   Procedure: CYSTOSCOPY WITH  BLADDER BIOPSY, TRANSURETHRAL FULGERATION OF BLADDER, EXAM UNDER ANESTHESIA, LEFT RETROGRADE PYELOGRAM;  Surgeon: Heloise Purpura, MD;  Location: [facility];  Service: Urology;  Laterality: Left;   CYSTOSCOPY WITH URETEROSCOPY AND STENT PLACEMENT Right 12/15/2019   Procedure: CYSTOSCOPY WITH RIGHT RETROGRADE/ RIGHT URETEROSCOPY/ BIOPSY;  Surgeon: Ihor Gully, MD;  Location: [facility];  Service: Urology;  Laterality: Right;   CYSTOSCOPY/RETROGRADE/URETEROSCOPY Bilateral 03/18/2018   Procedure: CYSTOSCOPY/RETROGRADE/URETEROSCOPY.;  Surgeon: Ihor Gully, MD;  Location: [facility];  Service: Urology;  Laterality: Bilateral;   EYE SURGERY Bilateral    cateract in January  2023   HYDROCELE EXCISION Left 07/27/2022   Procedure: HYDROCELE REPAIR;  Surgeon: Heloise Purpura, MD;  Location: [facility];  Service: Urology;  Laterality: Left;  GENERAL ANESTHESIA WITH PARALYSIS   KNEE ARTHROSCOPY Right    LEFT HEART CATHETERIZATION WITH CORONARY ANGIOGRAM N/A 02/04/2014   Procedure: LEFT HEART CATHETERIZATION WITH CORONARY ANGIOGRAM;  Surgeon: Iran Ouch, MD;  Location: [facility];  Service: Cardiovascular;  Laterality: N/A;  severe one-vessel CAD, chronically occluded RCA with faint left-to-right collaterals;  aneurysmal LCFx with sluggish coronary flow;  normal LVSF w/ moderately elevated LVEDP (ostialOM2 20%, pOM3 20%, pD1 20%, mCFX 50%, diffuse 20% pCFX)   PROSTATE BIOPSY N/A 08/12/2020   Procedure: BIOPSY TRANSRECTAL ULTRASONIC PROSTATE (TUBP);  Surgeon: Heloise Purpura, MD;  Location: [facility];  Service: Urology;  Laterality: N/A;   ROBOT ASSITED LAPAROSCOPIC NEPHROURETERECTOMY Right 03/22/2020   Procedure: XI ROBOT ASSITED LAPAROSCOPIC NEPHROURETERECTOMY;  Surgeon: Heloise Purpura, MD;  Location: [facility];  Service: Urology;  Laterality: Right;   TRANSTHORACIC ECHOCARDIOGRAM  11-22-2016   dr hochrein   ef 55-60%/ mild MR and TR/ moderate LAE   TRANSURETHRAL RESECTION OF BLADDER TUMOR N/A 02/10/2021   Procedure: TRANSURETHRAL RESECTION OF BLADDER TUMOR (TURBT)/ CYSTOSCOPY/ LEFT RETROGRADE;  Surgeon: Heloise Purpura, MD;  Location: [facility];  Service: Urology;  Laterality: N/A;  GENERAL ANESTHESIA WITH PARALYSIS   Family History  Problem Relation Age of Onset   Hypertension Mother    Cancer Mother        anal cancer   Esophageal cancer Neg Hx    Pancreatic cancer Neg Hx    Stomach cancer Neg Hx    Liver disease Neg Hx    Outpatient Medications Prior to Visit  Medication Sig Dispense Refill   amLODipine (NORVASC) 5 MG tablet Take 1 tablet (5 mg total) by mouth daily. 90 tablet 3   diphenhydramine-acetaminophen (TYLENOL PM) 25-500 MG TABS tablet Take 1 tablet by mouth at  bedtime as needed (pain/sleep).     finasteride (PROSCAR) 5 MG tablet Take 5 mg by mouth daily.     furosemide (LASIX) 20 MG tablet Take 1 tablet (20 mg total) by mouth daily. 90 tablet 3   isosorbide mononitrate (IMDUR) 30 MG 24 hr tablet TAKE ONE TABLET BY MOUTH DAILY 90 tablet 0   metoprolol tartrate (LOPRESSOR) 25 MG tablet Take 1 tablet (25 mg total) by mouth 2 (two) times daily. (Patient taking differently: Take 25 mg by mouth once.) 180 tablet 0   nitroGLYCERIN (NITROSTAT) 0.4 MG SL tablet TAKE ONE TABLET UNDER THE TONGUE EVERY FIVE MINUTES FOR THREE DOSES AS NEEDED FOR CHEST PAIN, CALL 911 IF 2ND DOSE DOESN'T HELP 25 tablet 4   olmesartan (BENICAR) 20 MG tablet TAKE ONE TABLET BY MOUTH DAILY (Patient taking differently: Take 40 mg by mouth daily.) 90 tablet 0   rosuvastatin (CRESTOR) 40 MG tablet Take 1 tablet (40 mg total) by mouth every morning. Please keep scheduled appointment 90 tablet 3   sitaGLIPtin (JANUVIA) 25 MG tablet Take 1 tablet (25 mg total) by mouth daily. 90 tablet 3   apixaban (ELIQUIS) 5 MG TABS tablet Take 5 mg by mouth 2 (two) times daily. (Patient not taking: Reported on 03/23/2023)  No facility-administered medications prior to visit.   Allergies  Allergen Reactions   Xarelto [Rivaroxaban] Other (See Comments)    Skin Blisters      Objective:   Today's Vitals   04/06/23 1548  BP: 132/80  Pulse: 82  Temp: 97.6 F (36.4 C)  TempSrc: Temporal  SpO2: 96%  Weight: 225 lb 6.4 oz (102.2 kg)  Height: 5\' 11"  (1.803 m)   Body mass index is 31.44 kg/m.   General: Well developed, well nourished. No acute distress. Skin: Warm and dry. There are two raised areas on the left forearm with surrounding reddish-purple   discoloration. I was able to express a small amount of pus from both sites. Psych: Alert and oriented. Normal mood and affect.  Health Maintenance Due  Topic Date Due   Zoster Vaccines- Shingrix (1 of 2) Never done   OPHTHALMOLOGY EXAM   05/18/2022   FOOT EXAM  12/13/2022   Diabetic kidney evaluation - Urine ACR  05/03/2023   Imaging: MR Abdomen and Pelvis (03/22/2023) IMPRESSION: 1. Mild degradation secondary to lack of IV contrast and motion. 2. Similar to decrease in mild right-sided bladder wall thickening, nonspecific in the setting of prior bladder cancer. Most likely treatment related. 3. Borderline left external iliac and right inguinal adenopathy, decreased. This could represent response to therapy of metastasis or be reactive. 4. No evidence of abdominal metastasis or recurrent disease, status post right nephrectomy.    Assessment & Plan:   Problem List Items Addressed This Visit       Cardiovascular and Mediastinum   Atrial fibrillation with RVR (HCC)    Rate controlled currently. Patient has stopped taking his Eliquis. He has been recommended to have a Watchman device placed.        Other   Lower urinary tract symptoms (LUTS)    I will try adding tamsulosin to Adam Harris finasteride to see if the combination is more effective for him.      Relevant Medications   tamsulosin (FLOMAX) 0.4 MG CAPS capsule   Other Visit Diagnoses     Cutaneous abscess of left upper extremity    -  Primary   Small superficial abscess. These may have started as bed bug bites. I will treat with topical mupirocin.   Relevant Medications   mupirocin cream (BACTROBAN) 2 %       Return in about 4 weeks (around 05/04/2023) for Reassessment of diabetes.   Loyola Mast, MD

## 2023-04-06 NOTE — Assessment & Plan Note (Signed)
Rate controlled currently. Patient has stopped taking his Eliquis. He has been recommended to have a Watchman device placed.

## 2023-04-10 NOTE — Patient Instructions (Addendum)
SURGICAL WAITING ROOM VISITATION Patients having surgery or a procedure may have no more than 2 support people in the waiting area - these visitors may rotate.    Children under the age of 87 must have an adult with them who is not the patient.  If the patient needs to stay at the hospital during part of their recovery, the visitor guidelines for inpatient rooms apply. Pre-op nurse will coordinate an appropriate time for 1 support person to accompany patient in pre-op.  This support person may not rotate.    Please refer to the [facility] website for the visitor guidelines for Inpatients (after your surgery is over and you are in a regular room).       Your procedure is scheduled on: 04-23-23   Report to [facility] Main Entrance    Report to admitting at 1:00 PM   Call this number if you have problems the morning of surgery [phone]   Do not eat food or drink liquids:After Midnight.          If you have questions, please contact your surgeon's office.   FOLLOW  ANY ADDITIONAL PRE OP INSTRUCTIONS YOU RECEIVED FROM YOUR SURGEON'S OFFICE!!!     Oral Hygiene is also important to reduce your risk of infection.                                    Remember - BRUSH YOUR TEETH THE MORNING OF SURGERY WITH YOUR REGULAR TOOTHPASTE   Do NOT smoke after Midnight   Take these medicines the morning of surgery with A SIP OF WATER:   Amlodipine  Finasteride  Isosorbide  Metoprolol  Rosuvastatin  Tamsulosin  How to Manage Your Diabetes Before and After Surgery  Why is it important to control my blood sugar before and after surgery? Improving blood sugar levels before and after surgery helps healing and can limit problems. A way of improving blood sugar control is eating a healthy diet by:  Eating less sugar and carbohydrates  Increasing activity/exercise  Talking with your doctor about reaching your blood sugar goals High blood sugars (greater than 180 mg/dL) can raise your  risk of infections and slow your recovery, so you will need to focus on controlling your diabetes during the weeks before surgery. Make sure that the doctor who takes care of your diabetes knows about your planned surgery including the date and location.  How do I manage my blood sugar before surgery? Check your blood sugar at least 4 times a day, starting 2 days before surgery, to make sure that the level is not too high or low. Check your blood sugar the morning of your surgery when you wake up and every 2 hours until you get to the Short Stay unit. If your blood sugar is less than 70 mg/dL, you will need to treat for low blood sugar: Do not take insulin. Treat a low blood sugar (less than 70 mg/dL) with  cup of clear juice (cranberry or apple), 4 glucose tablets, OR glucose gel. Recheck blood sugar in 15 minutes after treatment (to make sure it is greater than 70 mg/dL). If your blood sugar is not greater than 70 mg/dL on recheck, call [phone] for further instructions. Report your blood sugar to the short stay nurse when you get to Short Stay.  If you are admitted to the hospital after surgery: Your blood sugar will be  checked by the staff and you will probably be given insulin after surgery (instead of oral diabetes medicines) to make sure you have good blood sugar levels. The goal for blood sugar control after surgery is 80-180 mg/dL.   WHAT DO I DO ABOUT MY DIABETES MEDICATION?  Do not take oral diabetes medicines (pills) the morning of surgery.        Hold Farxiga 3 days before surgery (do not take after 04-19-23)   DO NOT TAKE THE FOLLOWING 7 DAYS PRIOR TO SURGERY: Ozempic, Wegovy, Rybelsus (Semaglutide), Byetta (exenatide), Bydureon (exenatide ER), Victoza, Saxenda (liraglutide), or Trulicity (dulaglutide) Mounjaro (Tirzepatide) Adlyxin (Lixisenatide), Polyethylene Glycol Loxenatide.   Reviewed and Endorsed by [facility] Patient Education Committee, August 2015                               You may not have any metal on your body including  jewelry, and body piercing             Do not wear lotions, powders, cologne, or deodorant              Men may shave face and neck.   Do not bring valuables to the hospital. [facility] IS NOT RESPONSIBLE   FOR VALUABLES.   Contacts, dentures or bridgework may not be worn into surgery.  DO NOT BRING YOUR HOME MEDICATIONS TO THE HOSPITAL. PHARMACY WILL DISPENSE MEDICATIONS LISTED ON YOUR MEDICATION LIST TO YOU DURING YOUR ADMISSION IN THE HOSPITAL!    Patients discharged on the day of surgery will not be allowed to drive home.  Someone NEEDS to stay with you for the first 24 hours after anesthesia.               Please read over the following fact sheets you were given: IF YOU HAVE QUESTIONS ABOUT YOUR PRE-OP INSTRUCTIONS PLEASE CALL [phone] [place]  If you received a COVID test during your pre-op visit  it is requested that you wear a mask when out in public, stay away from anyone that may not be feeling well and notify your surgeon if you develop symptoms. If you test positive for Covid or have been in contact with anyone that has tested positive in the last 10 days please notify you surgeon.  [facility] - Preparing for Surgery Before surgery, you can play an important role.  Because skin is not sterile, your skin needs to be as free of germs as possible.  You can reduce the number of germs on your skin by washing with CHG (chlorahexidine gluconate) soap before surgery.  CHG is an antiseptic cleaner which kills germs and bonds with the skin to continue killing germs even after washing. Please DO NOT use if you have an allergy to CHG or antibacterial soaps.  If your skin becomes reddened/irritated stop using the CHG and inform your nurse when you arrive at Short Stay. Do not shave (including legs and underarms) for at least 48 hours prior to the first CHG shower.  You may shave your face/neck.  Please follow these  instructions carefully:  1.  Shower with CHG Soap the night before surgery and the  morning of surgery.  2.  If you choose to wash your hair, wash your hair first as usual with your normal  shampoo.  3.  After you shampoo, rinse your hair and body thoroughly to remove the shampoo.  4.  Use CHG as you would any other liquid soap.  You can apply chg directly to the skin and wash.  Gently with a scrungie or clean washcloth.  5.  Apply the CHG Soap to your body ONLY FROM THE NECK DOWN.   Do   not use on face/ open                           Wound or open sores. Avoid contact with eyes, ears mouth and   genitals (private parts).                       Wash face,  Genitals (private parts) with your normal soap.             6.  Wash thoroughly, paying special attention to the area where your    surgery  will be performed.  7.  Thoroughly rinse your body with warm water from the neck down.  8.  DO NOT shower/wash with your normal soap after using and rinsing off the CHG Soap.                9.  Pat yourself dry with a clean towel.            10.  Wear clean pajamas.            11.  Place clean sheets on your bed the night of your first shower and do not  sleep with pets. Day of Surgery : Do not apply any lotions/deodorants the morning of surgery.  Please wear clean clothes to the hospital/surgery center.  FAILURE TO FOLLOW THESE INSTRUCTIONS MAY RESULT IN THE CANCELLATION OF YOUR SURGERY  PATIENT SIGNATURE_________________________________  NURSE SIGNATURE__________________________________  ________________________________________________________________________

## 2023-04-10 NOTE — Progress Notes (Addendum)
COVID Vaccine Completed:  No  Date of COVID positive in last 90 days:  No  PCP - Herbie Drape, MD Cardiologist - Rollene Rotunda, MD  Cardiac clearance in Epic dated 04-05-23 by Robin Searing, NP  Chest x-ray - N/A EKG - 12-08-22 Epic Stress Test - N/A ECHO - 06-12-22 Epic Cardiac Cath - 11-21-16 Epic Pacemaker/ICD device last checked: Spinal Cord Stimulator:  Bowel Prep - N/A  Sleep Study - N/A CPAP -   CBG 120 at PAT Fasting Blood Sugar -  Checks Blood Sugar - does not check   Last dose of GLP1 agonist-  N/A GLP1 instructions:  N/A   Marcelline Deist Last dose of SGLT-2 inhibitors-  N/A SGLT-2 instructions: Hold x3 days (do not take after 04-19-23)   Blood Thinner Instructions: N/A Aspirin Instructions: Last Dose:  Activity level:  Can go up a flight of stairs and perform activities of daily living without stopping and without symptoms of chest pain or shortness of breath.  Anesthesia review: CAD, Afib, Hx of MI, CHF, HTN, DM, CKD  Creatinine 2.46 on PAT labs  Patient denies shortness of breath, fever, cough and chest pain at PAT appointment  Patient verbalized understanding of instructions that were given to them at the PAT appointment. Patient was also instructed that they will need to review over the PAT instructions again at home before surgery.

## 2023-04-13 ENCOUNTER — Other Ambulatory Visit: Payer: Self-pay

## 2023-04-13 ENCOUNTER — Encounter (HOSPITAL_COMMUNITY): Payer: Self-pay

## 2023-04-13 ENCOUNTER — Encounter (HOSPITAL_COMMUNITY)
Admission: RE | Admit: 2023-04-13 | Discharge: 2023-04-13 | Disposition: A | Discharge status: Home / Self Care | Payer: Medicare PPO | Source: Ambulatory Visit | Attending: Urology | Admitting: Urology

## 2023-04-13 VITALS — BP 143/84 | HR 53 | Temp 98.4°F | Resp 16 | Ht 71.0 in | Wt 220.8 lb

## 2023-04-13 DIAGNOSIS — I129 Hypertensive chronic kidney disease with stage 1 through stage 4 chronic kidney disease, or unspecified chronic kidney disease: Secondary | ICD-10-CM | POA: Insufficient documentation

## 2023-04-13 DIAGNOSIS — E1136 Type 2 diabetes mellitus with diabetic cataract: Secondary | ICD-10-CM | POA: Insufficient documentation

## 2023-04-13 DIAGNOSIS — Z87891 Personal history of nicotine dependence: Secondary | ICD-10-CM | POA: Diagnosis not present

## 2023-04-13 DIAGNOSIS — I4891 Unspecified atrial fibrillation: Secondary | ICD-10-CM | POA: Diagnosis not present

## 2023-04-13 DIAGNOSIS — C679 Malignant neoplasm of bladder, unspecified: Secondary | ICD-10-CM | POA: Diagnosis not present

## 2023-04-13 DIAGNOSIS — I2510 Atherosclerotic heart disease of native coronary artery without angina pectoris: Secondary | ICD-10-CM | POA: Diagnosis not present

## 2023-04-13 DIAGNOSIS — E119 Type 2 diabetes mellitus without complications: Secondary | ICD-10-CM

## 2023-04-13 DIAGNOSIS — E1122 Type 2 diabetes mellitus with diabetic chronic kidney disease: Secondary | ICD-10-CM | POA: Diagnosis not present

## 2023-04-13 DIAGNOSIS — Z01812 Encounter for preprocedural laboratory examination: Secondary | ICD-10-CM | POA: Diagnosis not present

## 2023-04-13 LAB — BASIC METABOLIC PANEL
Anion gap: 8 (ref 5–15)
BUN: 39 mg/dL — ABNORMAL HIGH (ref 8–23)
CO2: 20 mmol/L — ABNORMAL LOW (ref 22–32)
Calcium: 8.7 mg/dL — ABNORMAL LOW (ref 8.9–10.3)
Chloride: 109 mmol/L (ref 98–111)
Creatinine, Ser: 2.46 mg/dL — ABNORMAL HIGH (ref 0.61–1.24)
GFR, Estimated: 26 mL/min — ABNORMAL LOW (ref >60)
Glucose, Bld: 121 mg/dL — ABNORMAL HIGH (ref 70–99)
Potassium: 4.7 mmol/L (ref 3.5–5.1)
Sodium: 137 mmol/L (ref 135–145)

## 2023-04-13 LAB — GLUCOSE, CAPILLARY: Glucose-Capillary: 120 mg/dL — ABNORMAL HIGH (ref 70–99)

## 2023-04-14 LAB — HEMOGLOBIN A1C
Hgb A1c MFr Bld: 6.7 % — ABNORMAL HIGH (ref 4.8–5.6)
Mean Plasma Glucose: 146 mg/dL

## 2023-04-16 NOTE — Anesthesia Preprocedure Evaluation (Addendum)
Anesthesia Evaluation  Patient identified by MRN, date of birth, ID band Patient awake    Reviewed: Allergy & Precautions, NPO status , Patient's Chart, lab work & pertinent test results, reviewed documented beta blocker date and time   Airway Mallampati: III  TM Distance: >3 FB Neck ROM: Full    Dental  (+) Edentulous Upper, Edentulous Lower, Dental Advisory Given   Pulmonary former smoker   Pulmonary exam normal breath sounds clear to auscultation       Cardiovascular hypertension, Pt. on medications and Pt. on home beta blockers + angina  + CAD, + Past MI and +CHF  Normal cardiovascular exam+ dysrhythmias (not on anticoagulation) Atrial Fibrillation  Rhythm:Regular Rate:Normal  TTE 2023  1. Left ventricular ejection fraction, by estimation, is 60 to 65%. The  left ventricle has normal function. The left ventricle has no regional  wall motion abnormalities. There is moderate left ventricular hypertrophy  of the basal-septal segment. Left  ventricular diastolic parameters are indeterminate.   2. Right ventricular systolic function is normal. The right ventricular  size is normal. There is mildly elevated pulmonary artery systolic  pressure.   3. Left atrial size was mildly dilated.   4. The mitral valve is normal in structure. Mild mitral valve  regurgitation. No evidence of mitral stenosis.   5. The aortic valve is tricuspid. There is mild calcification of the  aortic valve. Aortic valve regurgitation is not visualized. Aortic valve  sclerosis is present, with no evidence of aortic valve stenosis.   6. The inferior vena cava is normal in size with greater than 50%  respiratory variability, suggesting right atrial pressure of 3 mmHg.    Cath 2018 Multivessel CAD: The LAD had ostial 40 to less than 50% focal stenosis.  There were mild luminal irregularities in the LAD, which extended to the apex but otherwise was without  additional significant obstructive disease.   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  The OM1 vessel had 80% stenosis before giving rise to a branch and then had 50% stenosis in the inferior limb.  The AV groove circumflex was 99/100% occluded immediately after the large aneurysmal segment and  there was faint filling of a very large an additional aneurysmal segment with thrombus and very faint filling of the distal posterolateral branches.  There was faint collateralization of the distal posterolateral branches via the inferior branch of the obtuse marginal vessel.   The RCA was totally occluded proximally as had been previously demonstrated and there were faint bridging collaterals supplying the mid RCA which was a nondominant vessel distally.   LVEDP 18 mm Hg.   RECOMMENDATION: Angiographic findings were reviewed with Dr. Katrinka Blazing.  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  His RCA was totally occluded at that time.  His RCA is essentially unchanged.  His AV groove circumflex is now occluded, which most likely is the culprit lesion.  The patient had been inadequately been taking his anticoagulation.  Heparin will be resumed tonight with plans to resume oral anticoagulation.  Initially, the patient will be treated with increasing medical therapy.  He is completely pain-free.  He will be started on nitrates, in addition to increased beta blocker therapy.  He is not  a candidate for stenting due to the significant aneurysmal dilatations throughout his vessels.  If he develops increasing symptomatology, consider possible PTCA and/or evaluation for surgical revascularization.    Neuro/Psych  PSYCHIATRIC DISORDERS Anxiety      negative neurological ROS     GI/Hepatic negative GI ROS, Neg liver ROS,,,  Endo/Other  diabetes, Type 2, Oral Hypoglycemic Agents    Renal/GU Renal InsufficiencyRenal diseaseLab Results      Component                Value               Date                      CREATININE               2.46 (H)            04/13/2023                BUN                      39 (H)              04/13/2023                NA                       137                 04/13/2023                K                        4.7                 04/13/2023                CL                       109                 04/13/2023                CO2                      20 (L)              04/13/2023             negative genitourinary   Musculoskeletal  (+) Arthritis ,    Abdominal   Peds  Hematology negative hematology ROS (+)   Anesthesia Other Findings   Reproductive/Obstetrics                             Anesthesia Physical Anesthesia Plan  ASA: 3  Anesthesia Plan: General   Post-op Pain Management: Tylenol PO (pre-op)*   Induction: Intravenous  PONV Risk Score and Plan: 2 and Dexamethasone, Ondansetron and Treatment may vary due to age or medical condition  Airway Management Planned: Oral ETT  Additional Equipment:   Intra-op Plan:   Post-operative Plan: Extubation in OR  Informed Consent: I have reviewed the patients History and Physical, chart, labs and discussed the procedure including the risks, benefits and alternatives for the proposed anesthesia with the patient or authorized representative who  has indicated his/her understanding and acceptance.     Dental advisory given  Plan Discussed with: CRNA  Anesthesia Plan Comments: (See PAT note 04/13/2023)       Anesthesia Quick Evaluation

## 2023-04-16 NOTE — Progress Notes (Signed)
Anesthesia Chart Review   Case: [id number] Date/Time: 04/23/23 1500   Procedure: CYSTOSCOPY WITH BLADDER AND PROSTATIC URETHRAL BIOPSIES - 60 MINUTES NEEDED FOR CASE   Anesthesia type: General   Pre-op diagnosis: BLADDER CANCER   Location: [facility]OM 03 / [facility]   Surgeons: Heloise Purpura, MD       DISCUSSION:77 y.o. former smoker with h/o HTN, atrial fibrillation, CAD, DM II, CKD Stage III, bladder cancer scheduled for above procedure 04/23/2023 with Dr. Heloise Purpura.   Per cardiology preoperative evaluation 04/05/2023, "Chart reviewed as part of pre-operative protocol coverage. Given past medical history and time since last visit, based on ACC/AHA guidelines, Adam Harris is at acceptable risk for the planned procedure without further cardiovascular testing.  Patient was seen in the office by Dr. Antoine Poche and required no additional cardiovascular testing at this time. The patient was advised that if he develops new symptoms prior to surgery to contact our office to arrange for a follow-up visit, and he verbalized understanding. Per office protocol, patient can hold Eliquis for 2-3 days prior to procedure. "  Anticipate pt can proceed with planned procedure barring acute status change.   VS: BP (!) 143/84   Pulse (!) 53   Temp 36.9 C (Oral)   Resp 16   Ht 5\' 11"  (1.803 m)   Wt 100.2 kg   SpO2 96%   BMI 30.80 kg/m   PROVIDERS: Loyola Mast, MD is PCP   Primary Cardiologist: Rollene Rotunda, MD  LABS: Labs reviewed: Acceptable for surgery. (all labs ordered are listed, but only abnormal results are displayed)  Labs Reviewed  HEMOGLOBIN A1C - Abnormal; Notable for the following components:      Result Value   Hgb A1c MFr Bld 6.7 (*)    All other components within normal limits  BASIC METABOLIC PANEL - Abnormal; Notable for the following components:   CO2 20 (*)    Glucose, Bld 121 (*)    BUN 39 (*)    Creatinine, Ser 2.46 (*)    Calcium 8.7 (*)    GFR, Estimated 26 (*)    All  other components within normal limits  GLUCOSE, CAPILLARY - Abnormal; Notable for the following components:   Glucose-Capillary 120 (*)    All other components within normal limits     IMAGES:   EKG:   CV: Echo 06/12/2022 1. Left ventricular ejection fraction, by estimation, is 60 to 65%. The  left ventricle has normal function. The left ventricle has no regional  wall motion abnormalities. There is moderate left ventricular hypertrophy  of the basal-septal segment. Left  ventricular diastolic parameters are indeterminate.   2. Right ventricular systolic function is normal. The right ventricular  size is normal. There is mildly elevated pulmonary artery systolic  pressure.   3. Left atrial size was mildly dilated.   4. The mitral valve is normal in structure. Mild mitral valve  regurgitation. No evidence of mitral stenosis.   5. The aortic valve is tricuspid. There is mild calcification of the  aortic valve. Aortic valve regurgitation is not visualized. Aortic valve  sclerosis is present, with no evidence of aortic valve stenosis.   6. The inferior vena cava is normal in size with greater than 50%  respiratory variability, suggesting right atrial pressure of 3 mmHg.  Past Medical History:  Diagnosis Date   Abnormal radiologic findings on diagnostic imaging of renal pelvis, ureter, or bladder    bilateral ureter abnormalities   Anticoagulant long-term use  eliquis   Anxiety    Arthritis    CAD (coronary artery disease) cardiologist-- dr hochrein   NSTEMI 02-04-2014  per cardiac cath chronic occluded RCA w/ faint left-to-right collaterals and aneurysmal LCFx with sluggish coronary flow/   NSTEMI --11-21-2016 per cardiac cath occluded proximal RCA & mid to diastal CFX 100%, med rx. If that does not work, PTCA or CABG   Cancer [facility])    bladder and kidney   CKD (chronic kidney disease), stage III (HCC)    patient unaware   DOE (dyspnea on exertion)    Fatty liver    pt  denies   Hematuria 02/2019   History of COVID-19 10/2019   History of non-ST elevation myocardial infarction (NSTEMI)    02-04-2014  and 11-21-2016  cardiac cath done both times ,  medically management   History of shingles 12/2017   slight pain and numbness still noted in the area   Hyperlipidemia    Hypertension    Insomnia    Myocardial infarction [facility]) 2015   Persistent atrial fibrillation [facility])    cardiologsit-- dr hochrein   Thoracic aortic atherosclerosis (HCC)    Type 2 diabetes mellitus (HCC)    Urinary frequency     Past Surgical History:  Procedure Laterality Date   CARDIAC CATHETERIZATION N/A 11/21/2016   Procedure: Left Heart Cath and Coronary Angiography;  Surgeon: Lennette Bihari, MD;  Location: [facility];  Service: Cardiovascular;  Laterality: N/A;  pRCA 100% , ostial LAD 45%, OM3 80%, mCFX to dCFX 100% (AV groove), lateral OM3 50%   COLONOSCOPY     CYSTOSCOPY WITH BIOPSY N/A 08/12/2020   Procedure: CYSTOSCOPY WITH BLADDER BIOPSY AND TRANSURETHRAL RESECTION OF BLADDER TUMOR;  Surgeon: Heloise Purpura, MD;  Location: [facility];  Service: Urology;  Laterality: N/A;   CYSTOSCOPY WITH BIOPSY N/A 11/10/2021   Procedure: CYSTOSCOPY WITH BLADDER BIOPSIES/ LEFT RETROGRADE;  Surgeon: Heloise Purpura, MD;  Location[facility];  Service: Urology;  Laterality: N/A;   CYSTOSCOPY WITH BIOPSY Left 07/27/2022   Procedure: CYSTOSCOPY WITH  BLADDER BIOPSY, TRANSURETHRAL FULGERATION OF BLADDER, EXAM UNDER ANESTHESIA, LEFT RETROGRADE PYELOGRAM;  Surgeon: Heloise Purpura, MD;  Location: [facility];  Service: Urology;  Laterality: Left;   CYSTOSCOPY WITH URETEROSCOPY AND STENT PLACEMENT Right 12/15/2019   Procedure: CYSTOSCOPY WITH RIGHT RETROGRADE/ RIGHT URETEROSCOPY/ BIOPSY;  Surgeon: Ihor Gully, MD;  Location: [facility];  Service: Urology;  Laterality: Right;   CYSTOSCOPY/RETROGRADE/URETEROSCOPY Bilateral 03/18/2018   Procedure: CYSTOSCOPY/RETROGRADE/URETEROSCOPY.;   Surgeon: Ihor Gully, MD;  Location: [facility];  Service: Urology;  Laterality: Bilateral;   EYE SURGERY Bilateral    cateract in January 2023   HYDROCELE EXCISION Left 07/27/2022   Procedure: HYDROCELE REPAIR;  Surgeon: Heloise Purpura, MD;  Location[facility];  Service: Urology;  Laterality: Left;  GENERAL ANESTHESIA WITH PARALYSIS   KNEE ARTHROSCOPY Right    LEFT HEART CATHETERIZATION WITH CORONARY ANGIOGRAM N/A 02/04/2014   Procedure: LEFT HEART CATHETERIZATION WITH CORONARY ANGIOGRAM;  Surgeon: Iran Ouch, MD;  Location: [facility];  Service: Cardiovascular;  Laterality: N/A;  severe one-vessel CAD, chronically occluded RCA with faint left-to-right collaterals;  aneurysmal LCFx with sluggish coronary flow;  normal LVSF w/ moderately elevated LVEDP (ostialOM2 20%, pOM3 20%, pD1 20%, mCFX 50%, diffuse 20% pCFX)   PROSTATE BIOPSY N/A 08/12/2020   Procedure: BIOPSY TRANSRECTAL ULTRASONIC PROSTATE (TUBP);  Surgeon: Heloise Purpura, MD;  [facility];  Service: Urology;  Laterality: N/A;   ROBOT ASSITED  LAPAROSCOPIC NEPHROURETERECTOMY Right 03/22/2020   Procedure: XI ROBOT ASSITED LAPAROSCOPIC NEPHROURETERECTOMY;  Surgeon: Heloise Purpura, MD;  [facility];  Service: Urology;  Laterality: Right;   TRANSTHORACIC ECHOCARDIOGRAM  11-22-2016   dr hochrein   ef 55-60%/ mild MR and TR/ moderate LAE   TRANSURETHRAL RESECTION OF BLADDER TUMOR N/A 02/10/2021   Procedure: TRANSURETHRAL RESECTION OF BLADDER TUMOR (TURBT)/ CYSTOSCOPY/ LEFT RETROGRADE;  Surgeon: Heloise Purpura, MD;  [facility];  Service: Urology;  Laterality: N/A;  GENERAL ANESTHESIA WITH PARALYSIS    MEDICATIONS:  amLODipine (NORVASC) 5 MG tablet   FARXIGA 10 MG TABS tablet   finasteride (PROSCAR) 5 MG tablet   furosemide (LASIX) 20 MG tablet   isosorbide mononitrate (IMDUR) 30 MG 24 hr tablet   metoprolol tartrate (LOPRESSOR) 25 MG tablet   mupirocin cream (BACTROBAN) 2 %   nitroGLYCERIN (NITROSTAT)  0.4 MG SL tablet   olmesartan (BENICAR) 40 MG tablet   rosuvastatin (CRESTOR) 40 MG tablet   sitaGLIPtin (JANUVIA) 25 MG tablet   sodium bicarbonate 650 MG tablet   tamsulosin (FLOMAX) 0.4 MG CAPS capsule   No current facility-administered medications for this encounter.    Jodell Cipro Ward, PA-C [facility] [phone]

## 2023-04-17 DIAGNOSIS — C678 Malignant neoplasm of overlapping sites of bladder: Secondary | ICD-10-CM | POA: Diagnosis not present

## 2023-04-17 DIAGNOSIS — R8271 Bacteriuria: Secondary | ICD-10-CM | POA: Diagnosis not present

## 2023-04-18 ENCOUNTER — Telehealth: Payer: Self-pay

## 2023-04-18 DIAGNOSIS — I10 Essential (primary) hypertension: Secondary | ICD-10-CM

## 2023-04-18 MED ORDER — ISOSORBIDE MONONITRATE ER 30 MG PO TB24
30.0000 mg | ORAL_TABLET | Freq: Every day | ORAL | 1 refills | Status: DC
Start: 2023-04-18 — End: 2023-08-24

## 2023-04-18 NOTE — Telephone Encounter (Signed)
Requesting: ISOSORBIDE 30MG  ER Last Visit: 04/06/2023 Next Visit: Visit date not found Last Refill: 01/15/2023  Please Advise

## 2023-04-20 NOTE — H&P (Signed)
Office Visit Report     04/17/2023   --------------------------------------------------------------------------------   Adam Harris  MRN: [id number]  DOB: 03-15-1946, 77 year old Male  SSN: [id number]   PRIMARY CARE:  Adam Shu, MD  PRIMARY CARE FAX:  [phone]  REFERRING:  Adam Harris  PROVIDER:  Heloise Harris, M.D.  TREATING:  Adam Harris, [place]  LOCATION:  [facility] [phone]     --------------------------------------------------------------------------------   CC/HPI: Pt presents today for pre-operative history and physical exam in anticipation of cysto, bladder/prostate/urethral biopsies by Dr. Laverle Harris on 04/23/23. He is doing well and is without complaint.   Cardiac clearance obtained and pt may stop Eliquis 2-3 days pre-op.   Pt denies F/C, HA, CP, SOB, N/V, diarrhea/constipation, back pain, flank pain, hematuria, and dysuria.     HX:   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 Prostate cancer  4. Left hydrocele  5. BPH/hematuria   Adam Harris follows up today for ongoing close surveillance of his urothelial carcinoma. He did attempt to get CT imaging of the chest, abdomen, and pelvis last week but his serum creatinine was noted to be at 2.0. As such, he did undergo a CT scan of the chest without contrast but his abdominal and pelvic scan was canceled in favor of rescheduling him for an MR urogram. This has not yet been performed. He denies any recent gross hematuria. He does continue to have fairly significant urinary frequency and sometimes is getting up every 10 minutes at night with very poor sleep. He was supposed to have his PSA drawn last week when he had his renal function checked. Unfortunately, this was not checked by the lab as was ordered. He follows up today for surveillance cystoscopy. His last biopsy was negative when performed in September 2023.      ALLERGIES: Xarelto - Hives    MEDICATIONS: Finasteride 5 mg tablet 1 tablet PO Daily  Amlodipine Besylate 5 mg tablet 1 tablet PO Daily  Eliquis 5 mg tablet Oral  Farxiga 10 mg tablet 1 tablet PO Daily  Furosemide 20 mg tablet 1 tablet PO Daily  Isosorbide Mononitrate Er 30 mg tablet, extended release 24 hr 1 tablet PO Daily  Januvia 25 mg tablet 1 tablet PO Daily  Metoprolol Tartrate 25 mg tablet 1 tablet PO Daily  Nitrostat 0.4 MG Sublingual Tablet Sublingual Sublingual  Olmesartan Medoxomil 40 mg tablet 1 tablet PO Daily  Rosuvastatin Calcium 40 mg tablet 1 tablet PO Daily     GU PSH: Bladder Instill AntiCA Agent - 01/13/2022, 01/06/2022, 12/30/2021, 07/08/2021, 06/30/2021, 06/23/2021, 04/29/2021, 04/19/2021, 2022, 10/18/2020, 2021, 2021, 2021, 2021, 2021, 2021, 2021, 2021, 2021, 2021, 2021 Cysto Bladder Ureth Biopsy - 07/27/2022, 11/10/2021, 2021 Cysto Uretero Biopsy Fulgura, Right - 2021 Cystoscopy - 02/28/2023, 11/17/2022, 06/20/2022, 03/03/2022, 09/28/2021, 06/03/2021, 2022, 2021, 2021, 2019 Cystoscopy TURBT 2-5 cm - 2022, 2021 Cystoscopy Ureteroscopy, Bilateral - 2019 Inject For cystogram - 2021 Lap Nephro Ureterectomy, Right - 2021 Locm 300-399Mg /Ml Iodine,1Ml - 2021, 2021, 2019 Prostate Needle Biopsy - 2021, 2018 Repair Hydrocele - 07/27/2022       PSH Notes: Knee Arthroscopy   NON-GU PSH: Surgical Pathology, Gross And Microscopic Examination For Prostate Needle - 2018     GU PMH: History of bladder cancer - 02/28/2023, - 02/23/2023, - 11/17/2022 Prostate Cancer - 02/28/2023, - 06/20/2022, - 03/03/2022, - 11/29/2021, - 06/03/2021, - 2022, - 2022, -  2021 (Stable), His prostate remains benign to exam. I will obtain a PSA and have recommended he return in 6 months for repeat DRE and PSA., - 2020 (Stable), His prostate was noted to be entirely benign today. I will obtain a PSA today since he has due for a recheck., - 2019 (Stable), I went over his pathology report with him today which has  revealed no evidence of grade or stage progression of his low risk prostate cancer. My recommendation to him has been continued active surveillance and he has agreed with this plan., - 2018 (Stable), I have discussed with the patient the possibility of blood per rectum, per urethra and in the ejaculate. He was counseled to contact me if he has any difficulties following his prostate biopsy whatsoever., - 2018 (Stable), His prostate was noted to be smooth and benign to examination however his PSA has risen significantly. It was 8.3 when we did his biopsy initially and then came down quite low. I recommended we repeat the PSA today but also I told him this time we proceed with a repeat biopsy., - 2018 (Stable), His prostate remains benign on his examination and his PSA continues to remain low and stable. I will continue active surveillance with DRE and PSA again in 6 months., - 2017, Adenocarcinoma of prostate, - 2017 Renal pelvis cancer, right - 02/28/2023, - 02/27/2022, - 06/03/2021, - 2022, - 2022, - 2021 (Stable), - 2021, Right, - 2021 Bladder Cancer overlapping sites - 12/27/2022, - 11/10/2022, - 08/01/2022, - 06/20/2022, - 03/03/2022, - 01/13/2022, - 01/06/2022, - 12/30/2021, - 12/23/2021, - 07/08/2021, - 06/30/2021, - 06/23/2021, - 05/27/2021, - 04/29/2021, - 04/19/2021, - 2022, - 2022, - 2022, - 2022, - 10/18/2020, - 2021, - 2021, - 2021, - 2021, - 2021, - 2021, - 2021 BPH w/LUTS - 12/27/2022, - 11/10/2022 (Stable), - 06/20/2022 (Stable), He has some BPH by exam with nocturia 2 but he said it is not significant enough that he would want to consider any form of pharmacologic therapy at this time., - 2017, Benign prostatic hyperplasia with urinary obstruction, - 2016 Gross hematuria - 12/27/2022, Continues to have intermittent gross hematuria. It seems to be associated with some pain in his right flank region but no history of stones. With his previous abnormality of his ureter I have recommended we repeat a CT scan with and  without contrast and then will determine further evaluation once that is been completed., - 2020, He experienced gross hematuria. He is on Eliquis. My suspicion is that this is going to be from the prostate since no other abnormality was noted previously., - 2020 Urinary Frequency - 12/27/2022 Dysuria - 11/10/2022 Nocturia (Stable) - 11/10/2022, (Stable), His nocturia is not a significant bother to him., - 2017 CIS of the bladder - 08/16/2022, - 02/27/2022, - 11/29/2021, - 09/28/2021, - 06/03/2021, - 2021 Hydrocele - 08/16/2022, - 08/01/2022, - 06/27/2022, - 06/20/2022 Weak Urinary Stream - 06/20/2022 Hydrocele, Unspec - 11/29/2021, Hydrocele, left, - 2015 Microscopic hematuria - 09/28/2021 Abdominal Pain Unspec - 04/19/2021 Abnormal radiologic findings on diagnostic imaging of renal pelvis, ureter, or bladder, Bilateral - 2021, Bilateral, Due to the finding of abnormalities of both his right ureter and left renal pelvic region we are going to proceed with further evaluation with cystoscopy, bilateral retrograde pyelography, bilateral ureteroscopy and possible bilateral double-J stent placement., - 2019 Hydronephrosis (Stable), Right, He has mild right hydronephrosis noted on his CT scan that is unchanged and has been evaluated and noted to be nonobstructive in nature. -  2020 Microscopic hematuria, He was noted to have microscopic hematuria today. We therefore have discussed the need for further evaluation. He did not appear to have any infection but did have a few white cells so I am going to culture his urine. We will then obtain a creatinine and schedule him for a CT scan to evaluate the upper tract and will then have him return for lower tract evaluation with cystoscopy. - 2019 BPH w/o LUTS (Stable), He does have some slight prostatic enlargement on exam but does not have significant voiding symptoms. 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 He is s/p right RAL nephroureterectomy on 03/22/20.   Diagnosis: Extensive CIS of the right renal pelvis and ureter  Baseline renal function: Cr 1.77   Feb 2021: CIS of the bladder and right upper tract CIS  Feb-Mar 2021: 6 week induction BCG  May 2021: Right RAL nephroureterectomy  Aug 2021: Diffuse erythematous changes of bladder  Sep 2021: TUR of bladder tumor, High grade Ta urothelial carcinoma  Oct-Dec 2021: Repeat 6 week induction BCG  Mar 2022: Suspicious cystoscopy and cytology, TURBT/biopsy - Benign  May-Jun 2022: 3 week maintenance BCG  Aug 2022: 3 week maintenance BCG  Nov 2022: Suspicious cytology  Dec 2022: Bladder biopsies - Inflammation, no malignancy, L RPG - normal  Feb-Mar 2023: 3 week maintenance BCG  Apr 2023: Negative cystoscopy but suspicious cytology (discussed going back to the OR for further evaluation but he declined base on negative evaluation in Dec 2022)  Sep 2023: Bladder biopsies - benign  Jan 2024: Positive cytology for high grade urothelial carcinoma (discussed options and elected to proceed with repeat upper tract imaging and office cystoscopy considering the multiple endoscopic evaluations despite concerning cytologies    2) Prostate cancer: His PSA in 1/13 was found to be 8.33. He had no worrisome nodularity or induration noted on DRE but did have a mild rectal stricture. He underwent TRUS/BX on 01/18/12 at which time his prostate was noted to be 56 cc.  Pathology: He was found to have a single core positive for Gleason 3+3 = 6 adenocarcinoma involving 10% of that core.  Treatment: Active surveillance  Repeat TRUS/BX 07/24/17: Prostate volume - 57 cc  Pathology: 1 positive for Gleason 6 in 5% from the left apex laterally.  Treatment: Continued active surveillance.   Surveillance:  Sep 2021: 12 core biopsy - Benign with chronic inflammation, Vol 53  cc   3) BPH with LUTS: He reported having some slowing of his urinary stream as well as nocturia.  Treatment: None currently   4) Left hydrocele: At one point he found it to be a bother but he says it really not giving him any trouble other than occasionally when he crosses his legs. It became more symptomatic during 2023.   Sep 2023: Left hydrocele repair       NON-GU PMH: Bacteriuria (Stable) - 02/28/2023, (Stable), - 11/17/2022 (Stable), - 2022, - 2021 Hypertensive urgency - 11/10/2022 Neoplasm of uncertain behavior of trachea, bronchus and lung - 2021 Atherosclerotic heart disease of native coronary artery without angina pectoris, CAD (coronary artery disease) - 2016 Stenosis of anus and rectum, Rectal stricture - 2016 Atrial Fibrillation Diabetes Type 2 Hypercholesterolemia Hypertension    FAMILY HISTORY: Family Health Status - Mother's  Age - Runs In Family Family Health Status Number - Runs In Family Father Deceased At Age86 ___ - Runs In Family Hypertension - Mother   SOCIAL HISTORY: Marital Status: Married Preferred Language: English; Race: White Has never drank.  Drinks 3 caffeinated drinks per day.     Notes: Former smoker, Occupation:, Marital History - Currently Married, Caffeine Use, Alcohol Use   REVIEW OF SYSTEMS:    GU Review Male:   Patient denies frequent urination, hard to postpone urination, burning/ pain with urination, get up at night to urinate, leakage of urine, stream starts and stops, trouble starting your stream, have to strain to urinate , erection problems, and penile pain.  Gastrointestinal (Upper):   Patient denies nausea, vomiting, and indigestion/ heartburn.  Gastrointestinal (Lower):   Patient denies diarrhea and constipation.  Constitutional:   Patient denies fever, night sweats, weight loss, and fatigue.  Skin:   Patient denies skin rash/ lesion and itching.  Eyes:   Patient denies blurred vision and double vision.  Ears/ Nose/ Throat:    Patient denies sore throat and sinus problems.  Hematologic/Lymphatic:   Patient denies swollen glands and easy bruising.  Cardiovascular:   Patient denies leg swelling and chest pains.  Respiratory:   Patient denies cough and shortness of breath.  Endocrine:   Patient denies excessive thirst.  Musculoskeletal:   Patient denies back pain and joint pain.  Neurological:   Patient denies headaches and dizziness.  Psychologic:   Patient denies depression and anxiety.   VITAL SIGNS:      04/17/2023 12:55 PM  BP 155/97 mmHg  Pulse 74 /min  Temperature 97.3 F / 36.2 C   MULTI-SYSTEM PHYSICAL EXAMINATION:    Constitutional: Well-nourished. No physical deformities. Normally developed. Good grooming.  Neck: Neck symmetrical, not swollen. Normal tracheal position.  Respiratory: Normal breath sounds. No labored breathing, no use of accessory muscles.   Cardiovascular: Regular rate and rhythm. No murmur, no gallop.   Lymphatic: No enlargement of neck, axillae, groin.  Skin: No paleness, no jaundice, no cyanosis. No lesion, no ulcer, no rash.  Neurologic / Psychiatric: Oriented to time, oriented to place, oriented to person. No depression, no anxiety, no agitation.  Gastrointestinal: No mass, no tenderness, no rigidity, obese abdomen.   Eyes: Normal conjunctivae. Normal eyelids.  Ears, Nose, Mouth, and Throat: Left ear no scars, no lesions, no masses. Right ear no scars, no lesions, no masses. Nose no scars, no lesions, no masses. Normal hearing. Normal lips.  Musculoskeletal: Normal gait and station of head and neck.     Complexity of Data:  Records Review:   Previous Patient Records  Urine Test Review:   Urinalysis   04/17/23  Urinalysis  Urine Appearance Slightly Cloudy   Urine Color Yellow   Urine Glucose 3+ mg/dL  Urine Bilirubin Neg mg/dL  Urine Ketones Neg mg/dL  Urine Specific Gravity 1.025   Urine Blood 3+ ery/uL  Urine pH 6.0   Urine Protein 2+ mg/dL  Urine Urobilinogen 0.2  mg/dL  Urine Nitrites Neg   Urine Leukocyte Esterase Trace leu/uL  Urine WBC/hpf 6 - 10/hpf   Urine RBC/hpf 40 - 60/hpf   Urine Epithelial Cells 0 - 5/hpf   Urine Bacteria Few (10-25/hpf)   Urine Mucous Not Present   Urine Yeast NS (Not Seen)   Urine Trichomonas Not Present   Urine Cystals NS (Not Seen)   Urine Casts NS (Not Seen)   Urine Sperm Not Present    PROCEDURES:  Urinalysis w/Scope - 81001 Dipstick Dipstick Cont'd Micro  Color: Yellow Bilirubin: Neg mg/dL WBC/hpf: 6 - 40/JWJ  Appearance: Slightly Cloudy Ketones: Neg mg/dL RBC/hpf: 40 - 19/JYN  Specific Gravity: 1.025 Blood: 3+ ery/uL Bacteria: Few (10-25/hpf)  pH: 6.0 Protein: 2+ mg/dL Cystals: NS (Not Seen)  Glucose: 3+ mg/dL Urobilinogen: 0.2 mg/dL Casts: NS (Not Seen)    Nitrites: Neg Trichomonas: Not Present    Leukocyte Esterase: Trace leu/uL Mucous: Not Present      Epithelial Cells: 0 - 5/hpf      Yeast: NS (Not Seen)      Sperm: Not Present    ASSESSMENT:      ICD-10 Details  1 GU:   Bladder Cancer overlapping sites - C67.8    PLAN:           Orders Labs Urine Culture          Schedule Return Visit/Planned Activity: Keep Scheduled Appointment - Schedule Surgery          Document Letter(s):  Created for Patient: Clinical Summary         Notes:   There are no changes in the patients history or physical exam since last evaluation by Dr. Laverle Harris. Pt is scheduled to undergo cysto with bladder/prostate/urethral biopsies on 04/23/23.   Check urine culture r/o infection.   All pt's questions were answered to the best of my ability.          Next Appointment:      Next Appointment: 04/23/2023 03:15 PM    Appointment Type: Surgery     Location: [facility] [phone]    Provider: Heloise Harris, M.D.    Reason for Visit: [facility]/OP CYSTO, BLADDER AND PROSTATIC URETHRAL BIOPSISES      * Signed by Adam Amor, PA on 04/17/23 at 1:10 PM (EDT)*      The information  contained in this medical record document is considered private and confidential

## 2023-04-23 ENCOUNTER — Ambulatory Visit (HOSPITAL_COMMUNITY)
Admission: RE | Admit: 2023-04-23 | Discharge: 2023-04-23 | Disposition: A | Discharge status: Home / Self Care | Payer: Medicare PPO | Attending: Urology | Admitting: Urology

## 2023-04-23 ENCOUNTER — Encounter (HOSPITAL_COMMUNITY): Payer: Self-pay | Admitting: Urology

## 2023-04-23 ENCOUNTER — Ambulatory Visit (HOSPITAL_COMMUNITY): Payer: Medicare PPO | Admitting: Physician Assistant

## 2023-04-23 ENCOUNTER — Encounter (HOSPITAL_COMMUNITY)
Admission: RE | Disposition: A | Discharge status: Home / Self Care | Payer: Self-pay | Source: Home / Self Care | Attending: Urology

## 2023-04-23 ENCOUNTER — Other Ambulatory Visit: Payer: Self-pay

## 2023-04-23 ENCOUNTER — Ambulatory Visit (HOSPITAL_BASED_OUTPATIENT_CLINIC_OR_DEPARTMENT_OTHER): Payer: Medicare PPO | Admitting: Anesthesiology

## 2023-04-23 DIAGNOSIS — I25119 Atherosclerotic heart disease of native coronary artery with unspecified angina pectoris: Secondary | ICD-10-CM | POA: Diagnosis not present

## 2023-04-23 DIAGNOSIS — N400 Enlarged prostate without lower urinary tract symptoms: Secondary | ICD-10-CM | POA: Insufficient documentation

## 2023-04-23 DIAGNOSIS — N3289 Other specified disorders of bladder: Secondary | ICD-10-CM | POA: Diagnosis not present

## 2023-04-23 DIAGNOSIS — C678 Malignant neoplasm of overlapping sites of bladder: Secondary | ICD-10-CM | POA: Insufficient documentation

## 2023-04-23 DIAGNOSIS — I110 Hypertensive heart disease with heart failure: Secondary | ICD-10-CM | POA: Diagnosis not present

## 2023-04-23 DIAGNOSIS — I252 Old myocardial infarction: Secondary | ICD-10-CM | POA: Diagnosis not present

## 2023-04-23 DIAGNOSIS — C680 Malignant neoplasm of urethra: Secondary | ICD-10-CM | POA: Diagnosis not present

## 2023-04-23 DIAGNOSIS — Z87891 Personal history of nicotine dependence: Secondary | ICD-10-CM | POA: Insufficient documentation

## 2023-04-23 DIAGNOSIS — D090 Carcinoma in situ of bladder: Secondary | ICD-10-CM | POA: Diagnosis not present

## 2023-04-23 DIAGNOSIS — I509 Heart failure, unspecified: Secondary | ICD-10-CM

## 2023-04-23 DIAGNOSIS — Z7901 Long term (current) use of anticoagulants: Secondary | ICD-10-CM | POA: Insufficient documentation

## 2023-04-23 DIAGNOSIS — E119 Type 2 diabetes mellitus without complications: Secondary | ICD-10-CM

## 2023-04-23 HISTORY — PX: CYSTOSCOPY WITH BIOPSY: SHX5122

## 2023-04-23 LAB — GLUCOSE, CAPILLARY
Glucose-Capillary: 78 mg/dL (ref 70–99)
Glucose-Capillary: 55 mg/dL — ABNORMAL LOW (ref 70–99)
Glucose-Capillary: 94 mg/dL (ref 70–99)

## 2023-04-23 SURGERY — CYSTOSCOPY, WITH BIOPSY
Anesthesia: General

## 2023-04-23 MED ORDER — FENTANYL CITRATE (PF) 100 MCG/2ML IJ SOLN
INTRAMUSCULAR | Status: AC
  Filled 2023-04-23: qty 2

## 2023-04-23 MED ORDER — LACTATED RINGERS IV SOLN: INTRAVENOUS | Status: DC

## 2023-04-23 MED ORDER — LACTATED RINGERS IV SOLN: INTRAVENOUS | Status: DC | PRN

## 2023-04-23 MED ORDER — LIDOCAINE 2% (20 MG/ML) 5 ML SYRINGE
INTRAMUSCULAR | Status: DC | PRN
  Administered 2023-04-23: 80 mg via INTRAVENOUS

## 2023-04-23 MED ORDER — DEXAMETHASONE SODIUM PHOSPHATE 10 MG/ML IJ SOLN
INTRAMUSCULAR | Status: DC | PRN
  Administered 2023-04-23: 5 mg via INTRAVENOUS

## 2023-04-23 MED ORDER — EPHEDRINE SULFATE (PRESSORS) 50 MG/ML IJ SOLN
INTRAMUSCULAR | Status: DC | PRN
  Administered 2023-04-23 (×3): 10 mg via INTRAVENOUS

## 2023-04-23 MED ORDER — ACETAMINOPHEN 500 MG PO TABS: 1000.0000 mg | ORAL_TABLET | Freq: Once | ORAL | Status: DC

## 2023-04-23 MED ORDER — PHENAZOPYRIDINE HCL 200 MG PO TABS: 200.0000 mg | ORAL_TABLET | Freq: Three times a day (TID) | ORAL | 0 refills | Status: DC | PRN

## 2023-04-23 MED ORDER — SODIUM CHLORIDE 0.9 % IR SOLN
Status: DC | PRN
  Administered 2023-04-23: 3000 mL

## 2023-04-23 MED ORDER — STERILE WATER FOR IRRIGATION IR SOLN
Status: DC | PRN
  Administered 2023-04-23 (×2): 3000 mL

## 2023-04-23 MED ORDER — ORAL CARE MOUTH RINSE: 15.0000 mL | Freq: Once | OROMUCOSAL | Status: AC

## 2023-04-23 MED ORDER — EPHEDRINE 5 MG/ML INJ
INTRAVENOUS | Status: AC
  Filled 2023-04-23: qty 5

## 2023-04-23 MED ORDER — PROPOFOL 10 MG/ML IV BOLUS
INTRAVENOUS | Status: AC
  Filled 2023-04-23: qty 20

## 2023-04-23 MED ORDER — PROPOFOL 10 MG/ML IV BOLUS
INTRAVENOUS | Status: DC | PRN
  Administered 2023-04-23: 15 mg via INTRAVENOUS

## 2023-04-23 MED ORDER — 0.9 % SODIUM CHLORIDE (POUR BTL) OPTIME
TOPICAL | Status: DC | PRN
  Administered 2023-04-23: 1000 mL

## 2023-04-23 MED ORDER — ONDANSETRON HCL 4 MG/2ML IJ SOLN
INTRAMUSCULAR | Status: DC | PRN
  Administered 2023-04-23: 4 mg via INTRAVENOUS

## 2023-04-23 MED ORDER — FENTANYL CITRATE PF 50 MCG/ML IJ SOSY: 25.0000 ug | PREFILLED_SYRINGE | INTRAMUSCULAR | Status: DC | PRN

## 2023-04-23 MED ORDER — FENTANYL CITRATE (PF) 100 MCG/2ML IJ SOLN
INTRAMUSCULAR | Status: DC | PRN
  Administered 2023-04-23: 50 ug via INTRAVENOUS

## 2023-04-23 MED ORDER — CEFAZOLIN SODIUM-DEXTROSE 2-4 GM/100ML-% IV SOLN
2.0000 g | INTRAVENOUS | Status: AC
  Administered 2023-04-23: 2 g via INTRAVENOUS
  Filled 2023-04-23: qty 100

## 2023-04-23 MED ORDER — CHLORHEXIDINE GLUCONATE 0.12 % MT SOLN
15.0000 mL | Freq: Once | OROMUCOSAL | Status: AC
  Administered 2023-04-23: 15 mL via OROMUCOSAL

## 2023-04-23 SURGICAL SUPPLY — 18 items
BAG DRN RND TRDRP ANRFLXCHMBR (UROLOGICAL SUPPLIES)
BAG URINE DRAIN 2000ML AR STRL (UROLOGICAL SUPPLIES) IMPLANT
BAG URO CATCHER STRL LF (MISCELLANEOUS) ×1 IMPLANT
DRAPE FOOT SWITCH (DRAPES) ×1 IMPLANT
DRSG TELFA 3X8 NADH STRL (GAUZE/BANDAGES/DRESSINGS) IMPLANT
ELECT REM PT RETURN 15FT ADLT (MISCELLANEOUS) ×1 IMPLANT
GLOVE SURG LX STRL 7.5 STRW (GLOVE) ×1 IMPLANT
GOWN STRL REUS W/ TWL XL LVL3 (GOWN DISPOSABLE) ×1 IMPLANT
GOWN STRL REUS W/TWL XL LVL3 (GOWN DISPOSABLE) ×1
KIT TURNOVER KIT A (KITS) IMPLANT
LOOP CUT BIPOLAR 24F LRG (ELECTROSURGICAL) IMPLANT
MANIFOLD NEPTUNE II (INSTRUMENTS) ×1 IMPLANT
PACK CYSTO (CUSTOM PROCEDURE TRAY) ×1 IMPLANT
PENCIL SMOKE EVACUATOR (MISCELLANEOUS) IMPLANT
SYR TOOMEY IRRIG 70ML (MISCELLANEOUS)
SYRINGE TOOMEY IRRIG 70ML (MISCELLANEOUS) IMPLANT
TUBING CONNECTING 10 (TUBING) ×1 IMPLANT
TUBING UROLOGY SET (TUBING) ×1 IMPLANT

## 2023-04-23 NOTE — Anesthesia Procedure Notes (Signed)
Procedure Name: Intubation Date/Time: 04/23/2023 4:54 PM  Performed by: Deri Fuelling, CRNAPre-anesthesia Checklist: Patient identified, Emergency Drugs available, Suction available and Patient being monitored Patient Re-evaluated:Patient Re-evaluated prior to induction Oxygen Delivery Method: Circle system utilized Preoxygenation: Pre-oxygenation with 100% oxygen Induction Type: IV induction Ventilation: Mask ventilation without difficulty LMA: LMA inserted LMA Size: 4.0 Tube type: Oral Number of attempts: 1 Airway Equipment and Method: Stylet and Oral airway Placement Confirmation: ETT inserted through vocal cords under direct vision, positive ETCO2 and breath sounds checked- equal and bilateral Tube secured with: Tape Dental Injury: Teeth and Oropharynx as per pre-operative assessment

## 2023-04-23 NOTE — Anesthesia Postprocedure Evaluation (Signed)
Anesthesia Post Note  Patient: Adam Harris  Procedure(s) Performed: CYSTOSCOPY WITH BLADDER AND PROSTATIC URETHRAL BIOPSIES     Patient location during evaluation: PACU Anesthesia Type: General Level of consciousness: awake and alert Pain management: pain level controlled Vital Signs Assessment: post-procedure vital signs reviewed and stable Respiratory status: spontaneous breathing, nonlabored ventilation, respiratory function stable and patient connected to nasal cannula oxygen Cardiovascular status: blood pressure returned to baseline and stable Postop Assessment: no apparent nausea or vomiting Anesthetic complications: no   No notable events documented.  Last Vitals:  Vitals:   04/23/23 1750 04/23/23 1755  BP:    Pulse: 64 (!) 59  Resp: 18 17  Temp:    SpO2: 93% 93%    Last Pain:  Vitals:   04/23/23 1755  TempSrc:   PainSc: 0-No pain                 Donnell Beauchamp

## 2023-04-23 NOTE — Interval H&P Note (Signed)
History and Physical Interval Note:  04/23/2023 3:54 PM  Adam Harris  has presented today for surgery, with the diagnosis of BLADDER CANCER.  The various methods of treatment have been discussed with the patient and family. After consideration of risks, benefits and other options for treatment, the patient has consented to  Procedure(s) with comments: CYSTOSCOPY WITH BLADDER AND PROSTATIC URETHRAL BIOPSIES (N/A) - 60 MINUTES NEEDED FOR CASE as a surgical intervention.  The patient's history has been reviewed, patient examined, no change in status, stable for surgery.  I have reviewed the patient's chart and labs.  Questions were answered to the patient's satisfaction.     [employer]

## 2023-04-23 NOTE — Discharge Instructions (Signed)
You may see some blood in the urine and may have some burning with urination for 48-72 hours. You also may notice that you have to urinate more frequently or urgently after your procedure which is normal.  You should call should you develop an inability urinate, fever > 101, persistent nausea and vomiting that prevents you from eating or drinking to stay hydrated.    

## 2023-04-23 NOTE — Transfer of Care (Signed)
Immediate Anesthesia Transfer of Care Note  Patient: Adam Harris  Procedure(s) Performed: CYSTOSCOPY WITH BLADDER AND PROSTATIC URETHRAL BIOPSIES  Patient Location: PACU  Anesthesia Type:General  Level of Consciousness: awake and alert   Airway & Oxygen Therapy: Patient Spontanous Breathing and Patient connected to face mask oxygen  Post-op Assessment: Report given to RN and Post -op Vital signs reviewed and stable  Post vital signs: Reviewed and stable  Last Vitals:  Vitals Value Taken Time  BP    Temp    Pulse 62 04/23/23 1738  Resp 20 04/23/23 1738  SpO2 92 % 04/23/23 1738  Vitals shown include unvalidated device data.  Last Pain:  Vitals:   04/23/23 1320  TempSrc: Oral         Complications: No notable events documented.

## 2023-04-23 NOTE — Op Note (Signed)
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  Cystoscopy 2.  Bladder biopsies 3.  Prostatic urethral biopsies  Surgeon: Moody Bruins MD  Anesthesia: General  Complications: None  EBL: Minimal  Specimens: 1.  Bladder biopsies 2.  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  He has been undergoing surveillance cystoscopy and was recently noted to have another suspicious urine cytology.  Upper tract imaging has been unremarkable.  His bladder is demonstrated persistent but stable erythematous change without evidence of malignancy despite multiple biopsy procedures.  He presents today to undergo the above procedures due to persistence of suspicious urine cytology.  The potential risks, complications, and expected recovery process was discussed in detail.  Informed consent was obtained.  Description of procedure: The patient was taken the operating room and a general anesthetic was administered.  He was given preoperative antibiotics, placed in the dorsolithotomy position, and prepped and draped in usual sterile fashion.  Next, a preoperative timeout was performed.  Cystourethroscopy was performed with a 22 French cystoscope.  This revealed a normal anterior and posterior urethra with mild prostatic hypertrophy.  Inspection of bladder revealed stable erythematous change.  The right ureteral orifice was absent consistent with a surgical history.  Erythematous change was noted along the trigone in particular the left hemitrigone along with the posterior bladder.  Using  cold cup biopsy forceps, these erythematous areas were biopsied with care to avoid the right ureteral orifice.  A Bugbee electrode was used for hemostasis.  Additionally, the cold cup biopsy forceps was used to perform prostatic urethral biopsies which were also then cauterized with the Bugbee electrode.  The bladder was emptied and reinspected.  There did not appear to be any active bleeding within the bladder or prostatic urethra.  Was felt that a catheter would not be necessary.  The patient tolerated the procedure well without complications.  He was able to be extubated and transferred to the recovery unit in satisfactory condition.

## 2023-04-24 ENCOUNTER — Encounter (HOSPITAL_COMMUNITY): Payer: Self-pay | Admitting: Urology

## 2023-04-27 ENCOUNTER — Telehealth: Payer: Self-pay | Admitting: Family Medicine

## 2023-04-27 LAB — SURGICAL PATHOLOGY

## 2023-04-27 NOTE — Telephone Encounter (Signed)
Pt's wife, Darl Pikes is wanting a sleeping aid for her husband. He is having difficulty sleeping at night, nothing seems to help. She said this has been discussed in the past.  [id number] [place], [place] - [address] AT [address] [facility] [address] [place] [address] Phone: [phone]  Fax: [phone] DEA #: [id number]

## 2023-04-30 MED ORDER — TRAZODONE HCL 50 MG PO TABS: 25.0000 mg | ORAL_TABLET | Freq: Every evening | ORAL | 3 refills | Status: DC | PRN

## 2023-04-30 NOTE — Addendum Note (Signed)
Addended by: Loyola Mast on: 04/30/2023 05:03 PM   Modules accepted: Orders

## 2023-05-04 ENCOUNTER — Ambulatory Visit: Payer: Medicare PPO | Admitting: Family Medicine

## 2023-05-04 ENCOUNTER — Encounter: Payer: Self-pay | Admitting: Family Medicine

## 2023-05-04 VITALS — BP 132/80 | HR 74 | Temp 98.0°F | Ht 71.0 in | Wt 220.0 lb

## 2023-05-04 DIAGNOSIS — E1159 Type 2 diabetes mellitus with other circulatory complications: Secondary | ICD-10-CM | POA: Diagnosis not present

## 2023-05-04 DIAGNOSIS — N1832 Chronic kidney disease, stage 3b: Secondary | ICD-10-CM

## 2023-05-04 DIAGNOSIS — I25119 Atherosclerotic heart disease of native coronary artery with unspecified angina pectoris: Secondary | ICD-10-CM | POA: Diagnosis not present

## 2023-05-04 DIAGNOSIS — C689 Malignant neoplasm of urinary organ, unspecified: Secondary | ICD-10-CM

## 2023-05-04 DIAGNOSIS — I4891 Unspecified atrial fibrillation: Secondary | ICD-10-CM

## 2023-05-04 DIAGNOSIS — I5032 Chronic diastolic (congestive) heart failure: Secondary | ICD-10-CM | POA: Diagnosis not present

## 2023-05-04 DIAGNOSIS — I10 Essential (primary) hypertension: Secondary | ICD-10-CM

## 2023-05-04 DIAGNOSIS — Z7984 Long term (current) use of oral hypoglycemic drugs: Secondary | ICD-10-CM

## 2023-05-04 DIAGNOSIS — E782 Mixed hyperlipidemia: Secondary | ICD-10-CM | POA: Diagnosis not present

## 2023-05-04 DIAGNOSIS — B351 Tinea unguium: Secondary | ICD-10-CM

## 2023-05-04 DIAGNOSIS — E1142 Type 2 diabetes mellitus with diabetic polyneuropathy: Secondary | ICD-10-CM

## 2023-05-04 LAB — MICROALBUMIN / CREATININE URINE RATIO
Creatinine,U: 72.5 mg/dL
Microalb Creat Ratio: 110.8 mg/g — ABNORMAL HIGH (ref 0.0–30.0)
Microalb, Ur: 80.3 mg/dL — ABNORMAL HIGH (ref 0.0–1.9)

## 2023-05-04 NOTE — Assessment & Plan Note (Signed)
Compensated. Continue metoprolol tartrate 25 mg twice daily, olmesartan 40 mg daily, dapagliflozin 10 mg daily and PRN furosemide.

## 2023-05-04 NOTE — Assessment & Plan Note (Signed)
Reviewed aspects of foot care related to neuropathy.

## 2023-05-04 NOTE — Assessment & Plan Note (Signed)
Blood pressure is in good control. Continue metoprolol tartrate 25 mg twice daily, amlodipine 5 mg daily, olmesartan 40 mg daily.

## 2023-05-04 NOTE — Assessment & Plan Note (Signed)
Reviewed recent biopsy results with M.r Nils Flack. He will follow-up with Dr. Laverle Patter concerning the ongoing management plan.

## 2023-05-04 NOTE — Assessment & Plan Note (Signed)
At goal. Continue rosuvastatin 40 mg daily. 

## 2023-05-04 NOTE — Assessment & Plan Note (Signed)
Stable. Continue focus on blood pressure control and use of dapagliflozin 10 mg daily.

## 2023-05-04 NOTE — Assessment & Plan Note (Signed)
No chest pain. Continue metoprolol tartrate 25 mg twice daily and isosorbide mononitrate 30 mg daily.

## 2023-05-04 NOTE — Progress Notes (Signed)
[facility] [address] [place] [address] Dept: [phone] Dept Fax: [phone]  Chronic Care Office Visit  Subjective:    Patient ID: Adam Harris, male    DOB: September 11, 1946, 77 y.o..   MRN: [id number]  Chief Complaint  Patient presents with   Diabetes    4 week follow up, concerns with frequent urination, Insomnia, referral to dermatology   History of Present Illness:  Patient is in today for reassessment of chronic medical issues.  Mr. Ripp has a history of atrial fibrillation. He is on metoprolol tartrate 25 mg twice daily for rate control. He is no longer taking anticoagulation due to urinary tract bleeding issues.  Mr. Rinehart has a history of hypertension. He is managed on on metoprolol tartrate 25 mg twice daily, amlodipine 5 mg daily, olmesartan 40 mg daily.  Mr. Purdy has a history of coronary artery disease. He is managed on metoprolol tartrate 25 mg twice daily and isosorbide mononitrate 30 mg daily. He is not on aspirin due to hematuria issues.  Mr. Yurchak has a history of chronic diastolic heart failure. He is managed on metoprolol tartrate 25 mg twice daily, olmesartan 40 mg daily, dapagliflozin 10 mg daily and PRN furosemide.  Mr. Strike has a history of hyperlipidemia and is managed on rosuvastatin 40 mg daily.   Mr. Carley has a history of Type 2 diabetes. He is managed on sitagliptin (Januvia) 25 mg daily and dapagliflozin 10 mg daily.   Mr. 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 He has had a right nephroureterectomy. He continues to have issues with hematuria. He follows with Dr. Laverle Patter for monitoring of this.  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   Chronic anticoagulation 04/06/2021   Urothelial carcinoma, right renal pelvis and ureter (HCC) 03/22/2020   Chronic diastolic CHF (congestive heart failure), NYHA class 2 (HCC) 04/02/2017   Atrial fibrillation with RVR (HCC) 11/20/2016   OA (osteoarthritis) of knee 05/31/2016   Low back pain 05/31/2016   Exertional angina 10/29/2014   Chronic kidney disease, stage 3b (HCC) 10/29/2014   Coronary artery disease involving native coronary artery of native heart with angina pectoris (HCC) 03/09/2014   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/2014   Hyperlipidemia 02/05/2014   History of non-ST elevation myocardial infarction (NSTEMI) 02/03/2014   Essential hypertension 02/03/2014   Anxiety state 04/19/2013   History of prostate cancer 04/19/2013   Insomnia 04/19/2013   Stiffness of joint, lower leg 04/19/2013   Sciatica 04/19/2013   Past Surgical History:  Procedure Laterality Date   CARDIAC CATHETERIZATION N/A 11/21/2016   Procedure: Left Heart Cath and Coronary Angiography;  Surgeon: Lennette Bihari, MD;  Location: [facility];  Service: Cardiovascular;  Laterality: N/A;  pRCA 100% , ostial LAD 45%, OM3 80%, mCFX to dCFX 100% (AV groove), lateral OM3 50%   COLONOSCOPY     CYSTOSCOPY WITH BIOPSY N/A 08/12/2020   Procedure: CYSTOSCOPY WITH BLADDER BIOPSY AND TRANSURETHRAL RESECTION OF BLADDER TUMOR;  Surgeon: Heloise Purpura, MD;  Location: [facility];  Service: Urology;  Laterality: N/A;   CYSTOSCOPY WITH BIOPSY N/A 11/10/2021   Procedure: CYSTOSCOPY WITH BLADDER BIOPSIES/ LEFT RETROGRADE;  Surgeon: Heloise Purpura, MD;  Location: [facility];  Service: Urology;  Laterality: N/A;   CYSTOSCOPY WITH BIOPSY Left 07/27/2022    Procedure: CYSTOSCOPY WITH  BLADDER BIOPSY, TRANSURETHRAL FULGERATION OF BLADDER, EXAM UNDER ANESTHESIA, LEFT RETROGRADE PYELOGRAM;  Surgeon: Heloise Purpura, MD;  Location: [facility];  Service: Urology;  Laterality: Left;   CYSTOSCOPY WITH BIOPSY N/A 04/23/2023   Procedure: CYSTOSCOPY WITH BLADDER AND PROSTATIC URETHRAL BIOPSIES;  Surgeon: Heloise Purpura, MD;  Location: [facility];  Service: Urology;  Laterality: N/A;  60 MINUTES NEEDED FOR CASE   CYSTOSCOPY WITH URETEROSCOPY AND STENT PLACEMENT Right 12/15/2019   Procedure: CYSTOSCOPY WITH RIGHT RETROGRADE/ RIGHT URETEROSCOPY/ BIOPSY;  Surgeon: Ihor Gully, MD;  Location: [facility];  Service: Urology;  Laterality: Right;   CYSTOSCOPY/RETROGRADE/URETEROSCOPY Bilateral 03/18/2018   Procedure: CYSTOSCOPY/RETROGRADE/URETEROSCOPY.;  Surgeon: Ihor Gully, MD;  Location: [facility];  Service: Urology;  Laterality: Bilateral;   EYE SURGERY Bilateral    cateract in January 2023   HYDROCELE EXCISION Left 07/27/2022   Procedure: HYDROCELE REPAIR;  Surgeon: Heloise Purpura, MD;  Location: [facility];  Service: Urology;  Laterality: Left;  GENERAL ANESTHESIA WITH PARALYSIS   KNEE ARTHROSCOPY Right    LEFT HEART CATHETERIZATION WITH CORONARY ANGIOGRAM N/A 02/04/2014   Procedure: LEFT HEART CATHETERIZATION WITH CORONARY ANGIOGRAM;  Surgeon: Iran Ouch, MD;  Location: [facility];  Service: Cardiovascular;  Laterality: N/A;  severe one-vessel CAD, chronically occluded RCA with faint left-to-right collaterals;  aneurysmal LCFx with sluggish coronary flow;  normal LVSF w/ moderately elevated LVEDP (ostialOM2 20%, pOM3 20%, pD1 20%, mCFX 50%, diffuse 20% pCFX)   PROSTATE BIOPSY N/A 08/12/2020   Procedure: BIOPSY TRANSRECTAL ULTRASONIC PROSTATE (TUBP);  Surgeon: Heloise Purpura, MD;  Location: [facility];  Service: Urology;  Laterality: N/A;   ROBOT ASSITED LAPAROSCOPIC NEPHROURETERECTOMY Right 03/22/2020   Procedure: XI ROBOT ASSITED  LAPAROSCOPIC NEPHROURETERECTOMY;  Surgeon: Heloise Purpura, MD;  Location: [facility];  Service: Urology;  Laterality: Right;   TRANSTHORACIC ECHOCARDIOGRAM  11-22-2016   dr hochrein   ef 55-60%/ mild MR and TR/ moderate LAE   TRANSURETHRAL RESECTION OF BLADDER TUMOR N/A 02/10/2021   Procedure: TRANSURETHRAL RESECTION OF BLADDER TUMOR (TURBT)/ CYSTOSCOPY/ LEFT RETROGRADE;  Surgeon: Heloise Purpura, MD;  Location: [facility];  Service: Urology;  Laterality: N/A;  GENERAL ANESTHESIA WITH PARALYSIS   Family History  Problem Relation Age of Onset   Hypertension Mother    Cancer Mother        anal cancer   Esophageal cancer Neg Hx    Pancreatic cancer Neg Hx    Stomach cancer Neg Hx    Liver disease Neg Hx    Outpatient Medications Prior to Visit  Medication Sig Dispense Refill   amLODipine (NORVASC) 5 MG tablet Take 1 tablet (5 mg total) by mouth daily. 90 tablet 3   FARXIGA 10 MG TABS tablet Take 10 mg by mouth daily.     finasteride (PROSCAR) 5 MG tablet Take 5 mg by mouth daily.     furosemide (LASIX) 20 MG tablet Take 1 tablet (20 mg total) by mouth daily. 90 tablet 3   isosorbide mononitrate (IMDUR) 30 MG 24 hr tablet Take 1 tablet (30 mg total) by mouth daily. 90 tablet 1   metoprolol tartrate (LOPRESSOR) 25 MG tablet Take 1 tablet (25  mg total) by mouth 2 (two) times daily. (Patient taking differently: Take 25 mg by mouth daily.) 180 tablet 0   mupirocin cream (BACTROBAN) 2 % Apply 1 Application topically 2 (two) times daily. 15 g 0   nitroGLYCERIN (NITROSTAT) 0.4 MG SL tablet TAKE ONE TABLET UNDER THE TONGUE EVERY FIVE MINUTES FOR THREE DOSES AS NEEDED FOR CHEST PAIN, CALL 911 IF 2ND DOSE DOESN'T HELP 25 tablet 4   olmesartan (BENICAR) 40 MG tablet Take 40 mg by mouth daily.     phenazopyridine (PYRIDIUM) 200 MG tablet Take 1 tablet (200 mg total) by mouth 3 (three) times daily as needed for pain. 20 tablet 0   rosuvastatin (CRESTOR) 40 MG tablet Take 1 tablet (40 mg total) by mouth every  morning. Please keep scheduled appointment 90 tablet 3   sitaGLIPtin (JANUVIA) 25 MG tablet Take 1 tablet (25 mg total) by mouth daily. 90 tablet 3   sodium bicarbonate 650 MG tablet Take 650 mg by mouth 2 (two) times daily.     tamsulosin (FLOMAX) 0.4 MG CAPS capsule Take 1 capsule (0.4 mg total) by mouth daily. 30 capsule 3   traZODone (DESYREL) 50 MG tablet Take 0.5-1 tablets (25-50 mg total) by mouth at bedtime as needed for sleep. 30 tablet 3   No facility-administered medications prior to visit.   Allergies  Allergen Reactions   Xarelto [Rivaroxaban] Other (See Comments)    Skin Blisters    Objective:   Today's Vitals   05/04/23 1327  BP: 132/80  Pulse: 74  Temp: 98 F (36.7 C)  SpO2: 94%  Weight: 220 lb (99.8 kg)  Height: 5\' 11"  (1.803 m)   Body mass index is 30.68 kg/m.   General: Well developed, well nourished. No acute distress. Feet- Skin intact. No sign of maceration between toes. Nails are thickened and dystrophic. Dorsalis pedis and posterior   tibial artery pulses are normal. 5.07 monofilament testing shows absence of sensation in toes. Psych: Alert and oriented. Normal mood and affect.  Health Maintenance Due  Topic Date Due   COVID-19 Vaccine (1) Never done   Zoster Vaccines- Shingrix (1 of 2) Never done   OPHTHALMOLOGY EXAM  05/18/2022   FOOT EXAM  12/13/2022   Diabetic kidney evaluation - Urine ACR  05/03/2023   Medicare Annual Wellness (AWV)  06/14/2023   Lab Results: Lab Results  Component Value Date   HGBA1C 6.7 (H) 04/13/2023      Latest Ref Rng & Units 04/13/2023   11:20 AM 12/08/2022    2:19 PM 11/24/2022    9:16 AM  BMP  Glucose 70 - 99 mg/dL 562  130  865   BUN 8 - 23 mg/dL 39  38  31   Creatinine 0.61 - 1.24 mg/dL 7.84  6.96  2.95   BUN/Creat Ratio 10 - 24  17  12    Sodium 135 - 145 mmol/L 137  142  142   Potassium 3.5 - 5.1 mmol/L 4.7  4.7  4.6   Chloride 98 - 111 mmol/L 109  107  108   CO2 22 - 32 mmol/L 20  20  21    Calcium 8.9  - 10.3 mg/dL 8.7  9.6  9.3    Lab Results  Component Value Date   CHOL 89 (L) 04/03/2023   HDL 32 (L) 04/03/2023   LDLCALC 45 04/03/2023   TRIG 49 04/03/2023   CHOLHDL 2.8 04/03/2023     Assessment & Plan:   Problem List Items  Addressed This Visit       Cardiovascular and Mediastinum   Essential hypertension    Blood pressure is in good control. Continue metoprolol tartrate 25 mg twice daily, amlodipine 5 mg daily, olmesartan 40 mg daily.      Coronary artery disease involving native coronary artery of native heart with angina pectoris (HCC)    No chest pain. Continue metoprolol tartrate 25 mg twice daily and isosorbide mononitrate 30 mg daily.      Type 2 diabetes mellitus with cardiac complication (HCC)    Diabetes is in good control. Continue sitagliptin (Januvia) 25 mg daily and dapagliflozin 10 mg daily. I recommend he follow-up for his annual eye exam. We will check micral today.      Relevant Orders   Microalbumin / creatinine urine ratio   Atrial fibrillation with RVR (HCC) - Primary    Rate controlled currently. Patient has stopped taking his Eliquis. He has been recommended to have a Watchman device placed.      Chronic diastolic CHF (congestive heart failure), NYHA class 2 (HCC)    Compensated. Continue metoprolol tartrate 25 mg twice daily, olmesartan 40 mg daily, dapagliflozin 10 mg daily and PRN furosemide.        Endocrine   Diabetic peripheral neuropathy (HCC)    Reviewed aspects of foot care related to neuropathy.        Genitourinary   Chronic kidney disease, stage 3b (HCC)    Stable. Continue focus on blood pressure control and use of dapagliflozin 10 mg daily.        Other   Hyperlipidemia    At goal. Continue rosuvastatin 40 mg daily.      Urothelial carcinoma, right renal pelvis and ureter [facility])    Reviewed recent biopsy results with M.r Nils Flack. He will follow-up with Dr. Laverle Patter concerning the ongoing management plan.      Other Visit  Diagnoses     Onychomycosis       I will refer to podiatry.   Relevant Orders   Ambulatory referral to Podiatry       Return in about 3 months (around 08/04/2023) for Reassessment.   Loyola Mast, MD

## 2023-05-04 NOTE — Assessment & Plan Note (Signed)
Rate controlled currently. Patient has stopped taking his Eliquis. He has been recommended to have a Watchman device placed. 

## 2023-05-04 NOTE — Assessment & Plan Note (Signed)
Diabetes is in good control. Continue sitagliptin (Januvia) 25 mg daily and dapagliflozin 10 mg daily. I recommend he follow-up for his annual eye exam. We will check micral today.

## 2023-05-09 ENCOUNTER — Telehealth: Payer: Self-pay | Admitting: Family Medicine

## 2023-05-09 DIAGNOSIS — F5101 Primary insomnia: Secondary | ICD-10-CM

## 2023-05-09 NOTE — Telephone Encounter (Signed)
Pt's wife says he is suppose to not get traZODone (DESYREL) 50 MG tablet [[id number]]. He has talked about getting another script besides this one.   [facility] - [place], [place] - [address] [place], [place] [address] Phone: [phone]  Fax: [phone]   Darl Pikes at [phone] [facility])

## 2023-05-09 NOTE — Telephone Encounter (Signed)
Pt is requesting for a change from his Trazodone. He stated it is not working.

## 2023-05-10 NOTE — Telephone Encounter (Signed)
Spoke to patient's wife, the Trazodone isn't working for him.  She states that there was a discussion at last OV about getting a different medication.   Please review and advise.  Thanks.  Dm/cma

## 2023-05-10 NOTE — Telephone Encounter (Signed)
Lft VM to rtn call. Dm/cma  

## 2023-05-11 DIAGNOSIS — I129 Hypertensive chronic kidney disease with stage 1 through stage 4 chronic kidney disease, or unspecified chronic kidney disease: Secondary | ICD-10-CM | POA: Diagnosis not present

## 2023-05-11 DIAGNOSIS — N184 Chronic kidney disease, stage 4 (severe): Secondary | ICD-10-CM | POA: Diagnosis not present

## 2023-05-11 DIAGNOSIS — I2510 Atherosclerotic heart disease of native coronary artery without angina pectoris: Secondary | ICD-10-CM | POA: Diagnosis not present

## 2023-05-11 DIAGNOSIS — Z905 Acquired absence of kidney: Secondary | ICD-10-CM | POA: Diagnosis not present

## 2023-05-11 DIAGNOSIS — E1122 Type 2 diabetes mellitus with diabetic chronic kidney disease: Secondary | ICD-10-CM | POA: Diagnosis not present

## 2023-05-11 DIAGNOSIS — N1832 Chronic kidney disease, stage 3b: Secondary | ICD-10-CM | POA: Diagnosis not present

## 2023-05-11 DIAGNOSIS — R809 Proteinuria, unspecified: Secondary | ICD-10-CM | POA: Diagnosis not present

## 2023-05-11 DIAGNOSIS — N400 Enlarged prostate without lower urinary tract symptoms: Secondary | ICD-10-CM | POA: Diagnosis not present

## 2023-05-11 MED ORDER — HYDROXYZINE PAMOATE 25 MG PO CAPS
25.0000 mg | ORAL_CAPSULE | Freq: Three times a day (TID) | ORAL | 2 refills | Status: DC | PRN
Start: 2023-05-11 — End: 2024-07-30

## 2023-05-11 NOTE — Addendum Note (Signed)
Addended by: Loyola Mast on: 05/11/2023 01:03 PM   Modules accepted: Orders

## 2023-05-15 DIAGNOSIS — R8271 Bacteriuria: Secondary | ICD-10-CM | POA: Diagnosis not present

## 2023-05-15 DIAGNOSIS — C61 Malignant neoplasm of prostate: Secondary | ICD-10-CM | POA: Diagnosis not present

## 2023-05-15 DIAGNOSIS — D090 Carcinoma in situ of bladder: Secondary | ICD-10-CM | POA: Diagnosis not present

## 2023-05-25 ENCOUNTER — Ambulatory Visit (INDEPENDENT_AMBULATORY_CARE_PROVIDER_SITE_OTHER): Payer: Medicare PPO | Admitting: Podiatry

## 2023-05-25 DIAGNOSIS — B351 Tinea unguium: Secondary | ICD-10-CM

## 2023-05-25 DIAGNOSIS — M79675 Pain in left toe(s): Secondary | ICD-10-CM

## 2023-05-25 DIAGNOSIS — M79674 Pain in right toe(s): Secondary | ICD-10-CM

## 2023-05-25 NOTE — Progress Notes (Signed)
  Subjective:  Patient ID: Adam Harris, male    DOB: [birth year],  MRN: [id number]  Chief Complaint  Patient presents with   Nail Problem   77 y.o. male returns for the above complaint.  Patient presents with thickened elongated dystrophic mycotic toenails x 10 mild pain on palpation worse with ambulation is with pressure he is not regularly self and denies any other complaints he would like for me to do it.  Objective:  There were no vitals filed for this visit. Podiatric Exam: Vascular: dorsalis pedis and posterior tibial pulses are palpable bilateral. Capillary return is immediate. Temperature gradient is WNL. Skin turgor WNL  Sensorium: Normal Semmes Weinstein monofilament test. Normal tactile sensation bilaterally. Nail Exam: Pt has thick disfigured discolored nails with subungual debris noted bilateral entire nail hallux through fifth toenails.  Pain on palpation to the nails. Ulcer Exam: There is no evidence of ulcer or pre-ulcerative changes or infection. Orthopedic Exam: Muscle tone and strength are WNL. No limitations in general ROM. No crepitus or effusions noted.  Skin: No Porokeratosis. No infection or ulcers    Assessment & Plan:   1. Pain due to onychomycosis of toenails of both feet     Patient was evaluated and treated and all questions answered.  Onychomycosis with pain  -Nails palliatively debrided as below. -Educated on self-care  Procedure: Nail Debridement Rationale: pain  Type of Debridement: manual, sharp debridement. Instrumentation: Nail nipper, rotary burr. Number of Nails: 10  Procedures and Treatment: Consent by patient was obtained for treatment procedures. The patient understood the discussion of treatment and procedures well. All questions were answered thoroughly reviewed. Debridement of mycotic and hypertrophic toenails, 1 through 5 bilateral and clearing of subungual debris. No ulceration, no infection noted.  Return Visit-Office Procedure: Patient  instructed to return to the office for a follow up visit 3 months for continued evaluation and treatment.  Nicholes Rough, DPM    No follow-ups on file.

## 2023-06-01 ENCOUNTER — Telehealth: Payer: Self-pay | Admitting: Hematology

## 2023-06-01 NOTE — Telephone Encounter (Signed)
Rescheduled appointment per referral message. Patient is aware of the changes made to her upcoming appointments.

## 2023-06-04 DIAGNOSIS — C61 Malignant neoplasm of prostate: Secondary | ICD-10-CM | POA: Diagnosis not present

## 2023-06-08 ENCOUNTER — Inpatient Hospital Stay: Payer: Medicare PPO | Attending: Hematology | Admitting: Hematology

## 2023-06-08 ENCOUNTER — Encounter: Payer: Self-pay | Admitting: Hematology

## 2023-06-08 ENCOUNTER — Inpatient Hospital Stay: Payer: Medicare PPO

## 2023-06-08 ENCOUNTER — Other Ambulatory Visit: Payer: Self-pay

## 2023-06-08 VITALS — BP 147/102 | HR 54 | Temp 97.6°F | Resp 18 | Ht 70.0 in | Wt 214.5 lb

## 2023-06-08 DIAGNOSIS — E1122 Type 2 diabetes mellitus with diabetic chronic kidney disease: Secondary | ICD-10-CM | POA: Diagnosis not present

## 2023-06-08 DIAGNOSIS — Z7901 Long term (current) use of anticoagulants: Secondary | ICD-10-CM | POA: Insufficient documentation

## 2023-06-08 DIAGNOSIS — Z7984 Long term (current) use of oral hypoglycemic drugs: Secondary | ICD-10-CM | POA: Insufficient documentation

## 2023-06-08 DIAGNOSIS — N184 Chronic kidney disease, stage 4 (severe): Secondary | ICD-10-CM | POA: Diagnosis not present

## 2023-06-08 DIAGNOSIS — Z87891 Personal history of nicotine dependence: Secondary | ICD-10-CM | POA: Diagnosis not present

## 2023-06-08 DIAGNOSIS — C680 Malignant neoplasm of urethra: Secondary | ICD-10-CM | POA: Diagnosis not present

## 2023-06-08 DIAGNOSIS — I129 Hypertensive chronic kidney disease with stage 1 through stage 4 chronic kidney disease, or unspecified chronic kidney disease: Secondary | ICD-10-CM | POA: Insufficient documentation

## 2023-06-08 DIAGNOSIS — I2510 Atherosclerotic heart disease of native coronary artery without angina pectoris: Secondary | ICD-10-CM | POA: Insufficient documentation

## 2023-06-08 DIAGNOSIS — Z79899 Other long term (current) drug therapy: Secondary | ICD-10-CM | POA: Insufficient documentation

## 2023-06-08 DIAGNOSIS — C679 Malignant neoplasm of bladder, unspecified: Secondary | ICD-10-CM

## 2023-06-08 DIAGNOSIS — C689 Malignant neoplasm of urinary organ, unspecified: Secondary | ICD-10-CM

## 2023-06-08 DIAGNOSIS — E785 Hyperlipidemia, unspecified: Secondary | ICD-10-CM | POA: Insufficient documentation

## 2023-06-08 NOTE — Progress Notes (Unsigned)
[facility]   [phone] Fax:([phone]   Clinic New Consult Note   Patient Care Team: Loyola Mast, MD as PCP - General (Family Medicine) Rollene Rotunda, MD as PCP - Cardiology (Cardiology) Heloise Purpura, MD as Consulting Physician (Urology) Rollene Rotunda, MD as Consulting Physician (Cardiology) Estanislado Emms, MD as Consulting Physician (Nephrology)  Date of Service:  06/08/2023  CHIEF COMPLAINTS/PURPOSE OF CONSULTATION:  Recurrent urethral carcinoma in situ  REFERRING PHYSICIAN:  Dr. Laverle Patter  ASSESSMENT & PLAN:  Adam Harris is a 77 y.o.  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  He has had multiple BCG maintenance therapy, is BCG refractory. -Given his BCG refractory disease, recurrence in bladder and the urethra, I recommend systemic therapy with Keytruda every 3 weeks (or every 6 weeks if he tolerates well) for 2 years, per NCCN guideline.  He had no history of autoimmune disease, overall in good health, there is no contraindication for immunotherapy. --Therapy consent: Side effects including but does not not limited to, fatigue, pneumonitis, colitis, thyroid and other endocrine disorders, skin rash, arthralgia, hepatitis, nephritis, autoimmune related cytopenias and neurotoxicity risk, with small possibility of fetal side effects, were discussed with patient in great detail.  We discussed side effect happens in 40-60% patients and includes severe side effects requiring hospitalization, he agrees to proceed.  We discussed the success rate (disease-free in 5  years) is about 40%. -The goal of therapy is curative,  -will schedule to start in 1-2 weeks -We also discussed the role of port placement, if he has poor IV access.  Will hold off for now.   Hypertension, diabetes, coronary artery disease, CKD -Continue medications, and follow-up with PCP and cardiologist.    PLAN:  -I recommend immunotherapy Keytruda every 3 weeks, will start in 1 to 2 weeks, patient agrees to proceed -Phone visit 1 to 2 weeks after his first treatment.  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 male is a here because of recurrent urethral carcinoma. The patient was referred by his urologist Dr. Laverle Patter. The patient presents to the clinic today accompanied by his wife.  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, status post BCG induction and right RAL nephroureterectomy on Mar 22, 2020 September 2021: TUR of bladder tumor, high-grade TA urothelial carcinoma October to December 2021: Repeat a 6-week induction BCG March 2022: Suspicious cystoscopy and cytology, TURBT tumor  biopsy was benign May to June of 2022: 3-week maintenance BCG August 2022: 3-week maintenance BCG November 2022: Suspicious cytology February to March 2023: 3 weeks of maintenance BCG January 2024: Positive cytology for  high-grade urothelial carcinoma June 2024: Repeat bladder biopsies and prostatic urethral biopsies showed persistent positive cytology, urothelial carcinoma in situ  Patient usually presented with mild hematuria, urinary frequency when he had a recurrence.  He still has some urinary frequency, no hematuria lately.  He otherwise feels well overall.  He does have multiple medical comorbidities including hypertension, diabetes, coronary artery disease, and stage IV chronic kidney disease.  He functions normally at home.    REVIEW OF SYSTEMS:    Constitutional: Denies fevers, chills or abnormal night sweats Eyes: Denies blurriness of vision, double vision or watery eyes Ears, nose, mouth, throat, and face: Denies mucositis or sore throat Respiratory: Denies cough, dyspnea or wheezes Cardiovascular: Denies palpitation, chest discomfort or lower extremity swelling Gastrointestinal:  Denies nausea, heartburn or change in bowel habits Skin: Denies abnormal skin rashes Lymphatics: Denies new lymphadenopathy or easy bruising Neurological:Denies numbness, tingling or new weaknesses Behavioral/Psych: Mood is stable, no new changes  All other systems were reviewed with the patient and are negative.   MEDICAL HISTORY:  Past Medical History:  Diagnosis Date   Abnormal radiologic findings on diagnostic imaging of renal pelvis, ureter, or bladder    bilateral ureter abnormalities   Anticoagulant long-term use    eliquis   Anxiety    Arthritis    CAD (coronary artery disease) cardiologist-- dr hochrein   NSTEMI 02-04-2014  per cardiac cath chronic occluded RCA w/ faint left-to-right collaterals and aneurysmal LCFx with sluggish coronary flow/   NSTEMI --11-21-2016 per cardiac cath occluded proximal RCA & mid to diastal CFX 100%, med rx. If that does not work, PTCA or CABG   Cancer [facility])    bladder and kidney   CKD (chronic kidney disease), stage III (HCC)    patient unaware   DOE (dyspnea on  exertion)    Fatty liver    pt denies   Hematuria 02/2019   History of COVID-19 10/2019   History of non-ST elevation myocardial infarction (NSTEMI)    02-04-2014  and 11-21-2016  cardiac cath done both times ,  medically management   History of shingles 12/2017   slight pain and numbness still noted in the area   Hyperlipidemia    Hypertension    Insomnia    Myocardial infarction [facility]) 2015   Persistent atrial fibrillation [facility])    cardiologsit-- dr hochrein   Thoracic aortic atherosclerosis (HCC)    Type 2 diabetes mellitus (HCC)    Urinary frequency     SURGICAL HISTORY: Past Surgical History:  Procedure Laterality Date   CARDIAC CATHETERIZATION N/A 11/21/2016   Procedure: Left Heart Cath and Coronary Angiography;  Surgeon: Lennette Bihari, MD;  Location: [facility] INVASIVE CV [facility];  Service: Cardiovascular;  Laterality: N/A;  pRCA 100% , ostial LAD 45%, OM3 80%, mCFX to dCFX 100% (AV groove), lateral OM3 50%   COLONOSCOPY     CYSTOSCOPY WITH BIOPSY N/A 08/12/2020   Procedure: CYSTOSCOPY WITH BLADDER BIOPSY AND TRANSURETHRAL RESECTION OF BLADDER TUMOR;  Surgeon: Heloise Purpura, MD;  Location: [facility] ORS;  Service: Urology;  Laterality: N/A;   CYSTOSCOPY WITH BIOPSY N/A 11/10/2021   Procedure: CYSTOSCOPY WITH BLADDER BIOPSIES/ LEFT RETROGRADE;  Surgeon: Heloise Purpura, MD;  Location: [facility];  Service: Urology;  Laterality: N/A;   CYSTOSCOPY WITH BIOPSY Left 07/27/2022  Procedure: CYSTOSCOPY WITH  BLADDER BIOPSY, TRANSURETHRAL FULGERATION OF BLADDER, EXAM UNDER ANESTHESIA, LEFT RETROGRADE PYELOGRAM;  Surgeon: Heloise Purpura, MD;  Location: [facility];  Service: Urology;  Laterality: Left;   CYSTOSCOPY WITH BIOPSY N/A 04/23/2023   Procedure: CYSTOSCOPY WITH BLADDER AND PROSTATIC URETHRAL BIOPSIES;  Surgeon: Heloise Purpura, MD;  Location: [facility];  Service: Urology;  Laterality: N/A;  60 MINUTES NEEDED FOR CASE   CYSTOSCOPY WITH URETEROSCOPY AND STENT PLACEMENT Right 12/15/2019   Procedure: CYSTOSCOPY  WITH RIGHT RETROGRADE/ RIGHT URETEROSCOPY/ BIOPSY;  Surgeon: Ihor Gully, MD;  Location: [facility];  Service: Urology;  Laterality: Right;   CYSTOSCOPY/RETROGRADE/URETEROSCOPY Bilateral 03/18/2018   Procedure: CYSTOSCOPY/RETROGRADE/URETEROSCOPY.;  Surgeon: Ihor Gully, MD;  Location: [facility];  Service: Urology;  Laterality: Bilateral;   EYE SURGERY Bilateral    cateract in January 2023   HYDROCELE EXCISION Left 07/27/2022   Procedure: HYDROCELE REPAIR;  Surgeon: Heloise Purpura, MD;  Location: [facility];  Service: Urology;  Laterality: Left;  GENERAL ANESTHESIA WITH PARALYSIS   KNEE ARTHROSCOPY Right    LEFT HEART CATHETERIZATION WITH CORONARY ANGIOGRAM N/A 02/04/2014   Procedure: LEFT HEART CATHETERIZATION WITH CORONARY ANGIOGRAM;  Surgeon: Iran Ouch, MD;  Location: [facility] CATH [facility];  Service: Cardiovascular;  Laterality: N/A;  severe one-vessel CAD, chronically occluded RCA with faint left-to-right collaterals;  aneurysmal LCFx with sluggish coronary flow;  normal LVSF w/ moderately elevated LVEDP (ostialOM2 20%, pOM3 20%, pD1 20%, mCFX 50%, diffuse 20% pCFX)   PROSTATE BIOPSY N/A 08/12/2020   Procedure: BIOPSY TRANSRECTAL ULTRASONIC PROSTATE (TUBP);  Surgeon: Heloise Purpura, MD;  Location: [facility];  Service: Urology;  Laterality: N/A;   ROBOT ASSITED LAPAROSCOPIC NEPHROURETERECTOMY Right 03/22/2020   Procedure: XI ROBOT ASSITED LAPAROSCOPIC NEPHROURETERECTOMY;  Surgeon: Heloise Purpura, MD;  Location: [facility];  Service: Urology;  Laterality: Right;   TRANSTHORACIC ECHOCARDIOGRAM  11-22-2016   dr hochrein   ef 55-60%/ mild MR and TR/ moderate LAE   TRANSURETHRAL RESECTION OF BLADDER TUMOR N/A 02/10/2021   Procedure: TRANSURETHRAL RESECTION OF BLADDER TUMOR (TURBT)/ CYSTOSCOPY/ LEFT RETROGRADE;  Surgeon: Heloise Purpura, MD;  Location: [facility];  Service: Urology;  Laterality: N/A;  GENERAL ANESTHESIA WITH PARALYSIS    SOCIAL HISTORY: Social History    Socioeconomic History   Marital status: Married    Spouse name: Darl Pikes   Number of children: 3   Years of education: Not on file   Highest education level: Master's degree (e.g., MA, MS, MEng, MEd, MSW, MBA)  Occupational History   Occupation: Retired- Psychologist, occupational    Comment: [employer]  Tobacco Use   Smoking status: Former    Current packs/day: 0.00    Average packs/day: 0.3 packs/day for 4.0 years (1.0 ttl pk-yrs)    Types: Cigarettes    Start date: 1968    Quit date: 1972    Years since quitting: 52.6   Smokeless tobacco: Former    Types: Chew    Quit date: 1972  Vaping Use   Vaping status: Never Used  Substance and Sexual Activity   Alcohol use: No   Drug use: No   Sexual activity: Yes  Other Topics Concern   Not on file  Social History Narrative   Not on file   Social Determinants of Health   Financial Resource Strain: Low Risk  (06/13/2022)   Overall Financial Resource Strain (CARDIA)    Difficulty of Paying Living Expenses: Not hard at all  Food Insecurity: No Food Insecurity (06/13/2022)   Hunger Vital Sign  Worried About Programme researcher, broadcasting/film/video in the Last Year: Never true    Ran Out of Food in the Last Year: Never true  Transportation Needs: No Transportation Needs (06/13/2022)   PRAPARE - Administrator, Civil Service (Medical): No    Lack of Transportation (Non-Medical): No  Physical Activity: Insufficiently Active (06/13/2022)   Exercise Vital Sign    Days of Exercise per Week: 3 days    Minutes of Exercise per Session: 30 min  Stress: No Stress Concern Present (06/13/2022)   [employer] of Occupational Health - Occupational Stress Questionnaire    Feeling of Stress : Not at all  Social Connections: Moderately Integrated (06/13/2022)   Social Connection and Isolation Panel [NHANES]    Frequency of Communication with Friends and Family: Three times a week    Frequency of Social Gatherings with Friends and Family: Three times a week    Attends  Religious Services: 1 to 4 times per year    Active Member of Clubs or Organizations: No    Attends Banker Meetings: Never    Marital Status: Married  Catering manager Violence: Not At Risk (06/13/2022)   Humiliation, Afraid, Rape, and Kick questionnaire    Fear of Current or Ex-Partner: No    Emotionally Abused: No    Physically Abused: No    Sexually Abused: No    FAMILY HISTORY: Family History  Problem Relation Age of Onset   Hypertension Mother    Cancer Mother        anal cancer   Esophageal cancer Neg Hx    Pancreatic cancer Neg Hx    Stomach cancer Neg Hx    Liver disease Neg Hx     ALLERGIES:  is allergic to xarelto [rivaroxaban].  MEDICATIONS:  Current Outpatient Medications  Medication Sig Dispense Refill   amLODipine (NORVASC) 5 MG tablet Take 1 tablet (5 mg total) by mouth daily. 90 tablet 3   FARXIGA 10 MG TABS tablet Take 10 mg by mouth daily.     finasteride (PROSCAR) 5 MG tablet Take 5 mg by mouth daily.     furosemide (LASIX) 20 MG tablet Take 1 tablet (20 mg total) by mouth daily. 90 tablet 3   hydrOXYzine (VISTARIL) 25 MG capsule Take 1 capsule (25 mg total) by mouth every 8 (eight) hours as needed. 30 capsule 2   isosorbide mononitrate (IMDUR) 30 MG 24 hr tablet Take 1 tablet (30 mg total) by mouth daily. 90 tablet 1   metoprolol tartrate (LOPRESSOR) 25 MG tablet Take 1 tablet (25 mg total) by mouth 2 (two) times daily. (Patient taking differently: Take 25 mg by mouth daily.) 180 tablet 0   mupirocin cream (BACTROBAN) 2 % Apply 1 Application topically 2 (two) times daily. 15 g 0   nitroGLYCERIN (NITROSTAT) 0.4 MG SL tablet TAKE ONE TABLET UNDER THE TONGUE EVERY FIVE MINUTES FOR THREE DOSES AS NEEDED FOR CHEST PAIN, CALL 911 IF 2ND DOSE DOESN'T HELP 25 tablet 4   olmesartan (BENICAR) 40 MG tablet Take 40 mg by mouth daily.     phenazopyridine (PYRIDIUM) 200 MG tablet Take 1 tablet (200 mg total) by mouth 3 (three) times daily as needed for pain.  20 tablet 0   rosuvastatin (CRESTOR) 40 MG tablet Take 1 tablet (40 mg total) by mouth every morning. Please keep scheduled appointment 90 tablet 3   sitaGLIPtin (JANUVIA) 25 MG tablet Take 1 tablet (25 mg total) by mouth daily. 90 tablet 3  sodium bicarbonate 650 MG tablet Take 650 mg by mouth 2 (two) times daily.     tamsulosin (FLOMAX) 0.4 MG CAPS capsule Take 1 capsule (0.4 mg total) by mouth daily. 30 capsule 3   No current facility-administered medications for this visit.    PHYSICAL EXAMINATION: ECOG PERFORMANCE STATUS: 0 - Asymptomatic  Vitals:   06/08/23 1132  BP: (!) 147/102  Pulse: (!) 54  Resp: 18  Temp: 97.6 F (36.4 C)   Filed Weights   06/08/23 1132  Weight: 214 lb 8 oz (97.3 kg)    GENERAL:alert, no distress and comfortable SKIN: skin color, texture, turgor are normal, no rashes or significant lesions EYES: normal, Conjunctiva are pink and non-injected, sclera clear NECK: supple, thyroid normal size, non-tender, without nodularity LYMPH:  no palpable lymphadenopathy in the cervical, axillary  LUNGS: clear to auscultation and percussion with normal breathing effort HEART: regular rate & rhythm and no murmurs and no lower extremity edema ABDOMEN:abdomen soft, non-tender and normal bowel sounds Musculoskeletal:no cyanosis of digits and no clubbing  NEURO: alert & oriented x 3 with fluent speech, no focal motor/sensory deficits  LABORATORY DATA:  I have reviewed the data as listed    Latest Ref Rng & Units 11/24/2022    9:16 AM 07/25/2022    8:25 AM 11/02/2021    8:30 AM  CBC  WBC 3.4 - 10.8 x10E3/uL 10.5  9.8  8.8   Hemoglobin 13.0 - 17.7 g/dL 16.1  09.6  04.5   Hematocrit 37.5 - 51.0 % 44.8  43.3  48.4   Platelets 150 - 450 x10E3/uL 155  157  149        Latest Ref Rng & Units 04/13/2023   11:20 AM 12/08/2022    2:19 PM 11/24/2022    9:16 AM  CMP  Glucose 70 - 99 mg/dL 409  811  914   BUN 8 - 23 mg/dL 39  38  31   Creatinine 0.61 - 1.24 mg/dL 7.82   9.56  2.13   Sodium 135 - 145 mmol/L 137  142  142   Potassium 3.5 - 5.1 mmol/L 4.7  4.7  4.6   Chloride 98 - 111 mmol/L 109  107  108   CO2 22 - 32 mmol/L 20  20  21    Calcium 8.9 - 10.3 mg/dL 8.7  9.6  9.3      RADIOGRAPHIC STUDIES: I have personally reviewed the radiological images as listed and agreed with the findings in the report. No results found.   Orders Placed This Encounter  Procedures   CBC with Differential (Cancer Center Only)    Standing Status:   Future    Standing Expiration Date:   06/14/2024   CMP (Cancer Center only)    Standing Status:   Future    Standing Expiration Date:   06/14/2024   T4    Standing Status:   Future    Standing Expiration Date:   06/14/2024   TSH    Standing Status:   Future    Standing Expiration Date:   06/14/2024   CBC with Differential (Cancer Center Only)    Standing Status:   Future    Standing Expiration Date:   07/05/2024   CMP (Cancer Center only)    Standing Status:   Future    Standing Expiration Date:   07/05/2024   CBC with Differential (Cancer Center Only)    Standing Status:   Future    Standing Expiration Date:  07/26/2024   CMP (Cancer Center only)    Standing Status:   Future    Standing Expiration Date:   07/26/2024   T4    Standing Status:   Future    Standing Expiration Date:   07/26/2024   TSH    Standing Status:   Future    Standing Expiration Date:   07/26/2024    All questions were answered. The patient knows to call the clinic with any problems, questions or concerns. The total time spent in the appointment was 60 minutes.     Malachy Mood, MD 06/08/2023  I, Sharlette Dense, am acting as scribe for Malachy Mood, MD.   I have reviewed the above documentation for accuracy and completeness, and I agree with the above.

## 2023-06-08 NOTE — Progress Notes (Signed)
START ON PATHWAY REGIMEN - Bladder     A cycle is every 21 days:     Pembrolizumab   **Always confirm dose/schedule in your pharmacy ordering system**  Patient Characteristics: Pre-Cystectomy or Nonsurgical Candidate, M0 (Clinical Staging), High-Grade cTa, cN0 or cTis/cT1, cN0, Refractory to Intravesical BCG and Referring Urologist Indicates Other Intravesical Therapies Not Preferred Therapeutic Status: Pre-Cystectomy or Nonsurgical Candidate, M0 (Clinical Staging) AJCC M Category: cM0 AJCC 8 Stage Grouping: 0is AJCC T Category: cTis AJCC N Category: cN0 Intent of Therapy: Curative Intent, Discussed with Patient

## 2023-06-09 ENCOUNTER — Encounter: Payer: Self-pay | Admitting: Hematology

## 2023-06-09 NOTE — Assessment & Plan Note (Addendum)
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  He has had multiple BCG maintenance therapy, is BCG refractory. -Given his BCG refractory disease, recurrence in bladder and the urethra, I recommend systemic therapy with Keytruda every 3 weeks (or every 6 weeks if he tolerates well) for 2 years, per NCCN guideline.  He had no history of autoimmune disease, overall in good health, there is no contraindication for immunotherapy. --Therapy consent: Side effects including but does not not limited to, fatigue, pneumonitis, colitis, thyroid and other endocrine disorders, skin rash, arthralgia, hepatitis, nephritis, autoimmune related cytopenias and neurotoxicity risk, with small possibility of fetal side effects, were discussed with patient in great detail.  We discussed side effect happens in 40-60% patients and includes severe side effects requiring hospitalization, he agrees to proceed.  We discussed the success rate (disease-free in 5 years) is about 40%. -The goal of therapy is curative,  -will schedule to start in 1-2 weeks -We also discussed the role of port placement, if he has poor IV access.  Will hold off for now.

## 2023-06-10 ENCOUNTER — Other Ambulatory Visit: Payer: Self-pay

## 2023-06-11 ENCOUNTER — Other Ambulatory Visit: Payer: Self-pay | Admitting: Cardiology

## 2023-06-14 ENCOUNTER — Other Ambulatory Visit: Payer: Medicare PPO

## 2023-06-14 ENCOUNTER — Ambulatory Visit: Payer: Medicare PPO | Admitting: Hematology

## 2023-06-14 ENCOUNTER — Inpatient Hospital Stay: Payer: Medicare PPO | Attending: Hematology

## 2023-06-14 ENCOUNTER — Other Ambulatory Visit: Payer: Self-pay

## 2023-06-14 DIAGNOSIS — I10 Essential (primary) hypertension: Secondary | ICD-10-CM | POA: Insufficient documentation

## 2023-06-14 DIAGNOSIS — C680 Malignant neoplasm of urethra: Secondary | ICD-10-CM | POA: Insufficient documentation

## 2023-06-14 DIAGNOSIS — Z79899 Other long term (current) drug therapy: Secondary | ICD-10-CM | POA: Insufficient documentation

## 2023-06-14 DIAGNOSIS — Z5112 Encounter for antineoplastic immunotherapy: Secondary | ICD-10-CM | POA: Insufficient documentation

## 2023-06-14 DIAGNOSIS — E119 Type 2 diabetes mellitus without complications: Secondary | ICD-10-CM | POA: Insufficient documentation

## 2023-06-15 ENCOUNTER — Inpatient Hospital Stay: Payer: Medicare PPO

## 2023-06-15 ENCOUNTER — Other Ambulatory Visit: Payer: Self-pay | Admitting: Hematology

## 2023-06-15 ENCOUNTER — Other Ambulatory Visit: Payer: Self-pay

## 2023-06-15 VITALS — BP 133/84 | HR 61 | Temp 97.8°F | Resp 18 | Wt 218.8 lb

## 2023-06-15 DIAGNOSIS — I10 Essential (primary) hypertension: Secondary | ICD-10-CM | POA: Diagnosis not present

## 2023-06-15 DIAGNOSIS — Z5112 Encounter for antineoplastic immunotherapy: Secondary | ICD-10-CM | POA: Diagnosis not present

## 2023-06-15 DIAGNOSIS — E119 Type 2 diabetes mellitus without complications: Secondary | ICD-10-CM | POA: Diagnosis not present

## 2023-06-15 DIAGNOSIS — C680 Malignant neoplasm of urethra: Secondary | ICD-10-CM | POA: Diagnosis not present

## 2023-06-15 DIAGNOSIS — C679 Malignant neoplasm of bladder, unspecified: Secondary | ICD-10-CM

## 2023-06-15 DIAGNOSIS — Z79899 Other long term (current) drug therapy: Secondary | ICD-10-CM | POA: Diagnosis not present

## 2023-06-15 LAB — CMP (CANCER CENTER ONLY)
ALT: 11 U/L (ref 0–44)
AST: 16 U/L (ref 15–41)
Albumin: 4.1 g/dL (ref 3.5–5.0)
Alkaline Phosphatase: 95 U/L (ref 38–126)
Anion gap: 8 (ref 5–15)
BUN: 33 mg/dL — ABNORMAL HIGH (ref 8–23)
CO2: 20 mmol/L — ABNORMAL LOW (ref 22–32)
Calcium: 9.3 mg/dL (ref 8.9–10.3)
Chloride: 111 mmol/L (ref 98–111)
Creatinine: 2.34 mg/dL — ABNORMAL HIGH (ref 0.61–1.24)
GFR, Estimated: 28 mL/min — ABNORMAL LOW (ref >60)
Glucose, Bld: 177 mg/dL — ABNORMAL HIGH (ref 70–99)
Potassium: 3.8 mmol/L (ref 3.5–5.1)
Sodium: 139 mmol/L (ref 135–145)
Total Bilirubin: 2.2 mg/dL — ABNORMAL HIGH (ref 0.3–1.2)
Total Protein: 7 g/dL (ref 6.5–8.1)

## 2023-06-15 LAB — CBC WITH DIFFERENTIAL (CANCER CENTER ONLY)
Abs Immature Granulocytes: 0.04 10*3/uL (ref 0.00–0.07)
Basophils Absolute: 0.1 10*3/uL (ref 0.0–0.1)
Basophils Relative: 1 %
Eosinophils Absolute: 0.2 10*3/uL (ref 0.0–0.5)
Eosinophils Relative: 2 %
HCT: 43.9 % (ref 39.0–52.0)
Hemoglobin: 15 g/dL (ref 13.0–17.0)
Immature Granulocytes: 0 %
Lymphocytes Relative: 16 %
Lymphs Abs: 1.5 10*3/uL (ref 0.7–4.0)
MCH: 29.2 pg (ref 26.0–34.0)
MCHC: 34.2 g/dL (ref 30.0–36.0)
MCV: 85.4 fL (ref 80.0–100.0)
Monocytes Absolute: 1.1 10*3/uL — ABNORMAL HIGH (ref 0.1–1.0)
Monocytes Relative: 12 %
Neutro Abs: 6.3 10*3/uL (ref 1.7–7.7)
Neutrophils Relative %: 69 %
Platelet Count: 145 10*3/uL — ABNORMAL LOW (ref 150–400)
RBC: 5.14 MIL/uL (ref 4.22–5.81)
RDW: 14.6 % (ref 11.5–15.5)
WBC Count: 9.3 10*3/uL (ref 4.0–10.5)
nRBC: 0 % (ref 0.0–0.2)

## 2023-06-15 LAB — TSH: TSH: 3.897 u[IU]/mL (ref 0.350–4.500)

## 2023-06-15 MED ORDER — SODIUM CHLORIDE 0.9 % IV SOLN
200.0000 mg | Freq: Once | INTRAVENOUS | Status: AC
  Administered 2023-06-15: 200 mg via INTRAVENOUS
  Filled 2023-06-15: qty 200

## 2023-06-15 MED ORDER — SODIUM CHLORIDE 0.9 % IV SOLN: Freq: Once | INTRAVENOUS | Status: AC

## 2023-06-15 NOTE — Patient Instructions (Signed)
[facility]  Discharge Instructions: Thank you for choosing [facility] to provide your oncology and hematology care.   If you have a lab appointment with the Cancer Center, please go directly to the Cancer Center and check in at the registration area.   Wear comfortable clothing and clothing appropriate for easy access to any Portacath or PICC line.   We strive to give you quality time with your provider. You may need to reschedule your appointment if you arrive late (15 or more minutes).  Arriving late affects you and other patients whose appointments are after yours.  Also, if you miss three or more appointments without notifying the office, you may be dismissed from the clinic at the provider's discretion.      For prescription refill requests, have your pharmacy contact our office and allow 72 hours for refills to be completed.    Today you received the following chemotherapy and/or immunotherapy agents: Keytruda      To help prevent nausea and vomiting after your treatment, we encourage you to take your nausea medication as directed.  BELOW ARE SYMPTOMS THAT SHOULD BE REPORTED IMMEDIATELY: *FEVER GREATER THAN 100.4 F (38 C) OR HIGHER *CHILLS OR SWEATING *NAUSEA AND VOMITING THAT IS NOT CONTROLLED WITH YOUR NAUSEA MEDICATION *UNUSUAL SHORTNESS OF BREATH *UNUSUAL BRUISING OR BLEEDING *URINARY PROBLEMS (pain or burning when urinating, or frequent urination) *BOWEL PROBLEMS (unusual diarrhea, constipation, pain near the anus) TENDERNESS IN MOUTH AND THROAT WITH OR WITHOUT PRESENCE OF ULCERS (sore throat, sores in mouth, or a toothache) UNUSUAL RASH, SWELLING OR PAIN  UNUSUAL VAGINAL DISCHARGE OR ITCHING   Items with * indicate a potential emergency and should be followed up as soon as possible or go to the Emergency Department if any problems should occur.  Please show the CHEMOTHERAPY ALERT CARD or IMMUNOTHERAPY ALERT CARD at  check-in to the Emergency Department and triage nurse.  Should you have questions after your visit or need to cancel or reschedule your appointment, please contact [facility]  Dept: [phone]  and follow the prompts.  Office hours are 8:00 a.m. to 4:30 p.m. Monday - Friday. Please note that voicemails left after 4:00 p.m. may not be returned until the following business day.  We are closed weekends and major holidays. You have access to a nurse at all times for urgent questions. Please call the main number to the clinic Dept: [phone] and follow the prompts.   For any non-urgent questions, you may also contact your provider using MyChart. We now offer e-Visits for anyone 18 and older to request care online for non-urgent symptoms. For details visit [email or web address].   Also download the MyChart app! Go to the app store, search "MyChart", open the app, select [facility], and log in with your MyChart username and password.  Pembrolizumab Injection What is this medication? PEMBROLIZUMAB (PEM broe LIZ ue mab) treats some types of cancer. It works by helping your immune system slow or stop the spread of cancer cells. It is a monoclonal antibody. This medicine may be used for other purposes; ask your health care provider or pharmacist if you have questions. COMMON BRAND NAME(S): Keytruda What should I tell my care team before I take this medication? They need to know if you have any of these conditions: Allogeneic stem cell transplant (uses someone else's stem cells) Autoimmune diseases, such as Crohn disease, ulcerative colitis, lupus History of chest radiation Nervous   system problems, such as Guillain-Barre syndrome, myasthenia gravis Organ transplant An unusual or allergic reaction to pembrolizumab, other medications, foods, dyes, or preservatives Pregnant or trying to get pregnant Breast-feeding How should I use this medication? This  medication is injected into a vein. It is given by your care team in a hospital or clinic setting. A special MedGuide will be given to you before each treatment. Be sure to read this information carefully each time. Talk to your care team about the use of this medication in children. While it may be prescribed for children as young as 6 months for selected conditions, precautions do apply. Overdosage: If you think you have taken too much of this medicine contact a poison control center or emergency room at once. NOTE: This medicine is only for you. Do not share this medicine with others. What if I miss a dose? Keep appointments for follow-up doses. It is important not to miss your dose. Call your care team if you are unable to keep an appointment. What may interact with this medication? Interactions have not been studied. This list may not describe all possible interactions. Give your health care provider a list of all the medicines, herbs, non-prescription drugs, or dietary supplements you use. Also tell them if you smoke, drink alcohol, or use illegal drugs. Some items may interact with your medicine. What should I watch for while using this medication? Your condition will be monitored carefully while you are receiving this medication. You may need blood work while taking this medication. This medication may cause serious skin reactions. They can happen weeks to months after starting the medication. Contact your care team right away if you notice fevers or flu-like symptoms with a rash. The rash may be red or purple and then turn into blisters or peeling of the skin. You may also notice a red rash with swelling of the face, lips, or lymph nodes in your neck or under your arms. Tell your care team right away if you have any change in your eyesight. Talk to your care team if you may be pregnant. Serious birth defects can occur if you take this medication during pregnancy and for 4 months after the last  dose. You will need a negative pregnancy test before starting this medication. Contraception is recommended while taking this medication and for 4 months after the last dose. Your care team can help you find the option that works for you. Do not breastfeed while taking this medication and for 4 months after the last dose. What side effects may I notice from receiving this medication? Side effects that you should report to your care team as soon as possible: Allergic reactions--skin rash, itching, hives, swelling of the face, lips, tongue, or throat Dry cough, shortness of breath or trouble breathing Eye pain, redness, irritation, or discharge with blurry or decreased vision Heart muscle inflammation--unusual weakness or fatigue, shortness of breath, chest pain, fast or irregular heartbeat, dizziness, swelling of the ankles, feet, or hands Hormone gland problems--headache, sensitivity to light, unusual weakness or fatigue, dizziness, fast or irregular heartbeat, increased sensitivity to cold or heat, excessive sweating, constipation, hair loss, increased thirst or amount of urine, tremors or shaking, irritability Infusion reactions--chest pain, shortness of breath or trouble breathing, feeling faint or lightheaded Kidney injury (glomerulonephritis)--decrease in the amount of urine, red or dark brown urine, foamy or bubbly urine, swelling of the ankles, hands, or feet Liver injury--right upper belly pain, loss of appetite, nausea, light-colored stool, dark yellow or   brown urine, yellowing skin or eyes, unusual weakness or fatigue Pain, tingling, or numbness in the hands or feet, muscle weakness, change in vision, confusion or trouble speaking, loss of balance or coordination, trouble walking, seizures Rash, fever, and swollen lymph nodes Redness, blistering, peeling, or loosening of the skin, including inside the mouth Sudden or severe stomach pain, bloody diarrhea, fever, nausea, vomiting Side effects  that usually do not require medical attention (report to your care team if they continue or are bothersome): Bone, joint, or muscle pain Diarrhea Fatigue Loss of appetite Nausea Skin rash This list may not describe all possible side effects. Call your doctor for medical advice about side effects. You may report side effects to FDA at 1-800-FDA-1088. Where should I keep my medication? This medication is given in a hospital or clinic. It will not be stored at home. NOTE: This sheet is a summary. It may not cover all possible information. If you have questions about this medicine, talk to your doctor, pharmacist, or health care provider.  2024 Elsevier/Gold Standard (2022-03-14 00:00:00)  

## 2023-06-15 NOTE — Progress Notes (Signed)
Per Mosetta Putt MD, ok to treat today with bilirubin 2.2 and SCR 2.34.

## 2023-06-18 ENCOUNTER — Telehealth: Payer: Self-pay

## 2023-06-18 ENCOUNTER — Other Ambulatory Visit: Payer: Self-pay

## 2023-06-18 NOTE — Telephone Encounter (Signed)
Pt called stating he's having increased urine frequency, burning with urination, and dark color urine.  Pt denied fever, lower back pain, and blood in urine.  Pt stated he had these symptoms 2 to 3 days prior to his Pembrolizumab infusion.  Pt stated it seems like his symptoms have gotten worse.  Asked pt had he reached out to his urologist office regarding his symptoms.  Pt stated "NO" but will contact them now.  Instructed pt if his urology office cannot see him today to please call Dr. Latanya Maudlin office back.  Pt verbalized understanding and had no further questions.

## 2023-06-18 NOTE — Telephone Encounter (Signed)
Spoke with pt via telephone to f/u on call from this morning.  Pt stated he was able to get an appt w/his urologist on 06/19/2023.  Pt was "Thankful" for the return f/u call regarding conversation this morning.  Instructed pt to contact Dr. Latanya Maudlin office if the appt on 06/19/2023 w/the Urologist office is canceled.  Pt verbalized understanding and had no further questions or concerns.

## 2023-06-19 ENCOUNTER — Telehealth: Payer: Self-pay | Admitting: *Deleted

## 2023-06-19 DIAGNOSIS — N3000 Acute cystitis without hematuria: Secondary | ICD-10-CM | POA: Diagnosis not present

## 2023-06-19 NOTE — Telephone Encounter (Signed)
Called pt to see how he did with his treatment & he reports doing well & denies any side effects from immunotherapy.  He knows his next appts & how to reach [place] if needed.

## 2023-06-19 NOTE — Telephone Encounter (Signed)
-----   Message from Nurse Threasa Beards sent at 06/15/2023  2:58 PM EDT ----- Regarding: Dr Mosetta Putt pt, first time Keytruda infusion. Dr Mosetta Putt pt came in 8/2 for first time Keytruda infusion. Tolerated infusion well. Needs call back.

## 2023-06-26 ENCOUNTER — Inpatient Hospital Stay: Payer: Medicare PPO | Admitting: Hematology

## 2023-06-26 ENCOUNTER — Other Ambulatory Visit: Payer: Self-pay

## 2023-06-26 DIAGNOSIS — C689 Malignant neoplasm of urinary organ, unspecified: Secondary | ICD-10-CM

## 2023-06-26 NOTE — Progress Notes (Signed)
[facility]   Telephone:(336) [phone] Fax:(336) [phone]   Clinic Follow up Note   Patient Care Team: Loyola Mast, MD as PCP - General (Family Medicine) Rollene Rotunda, MD as PCP - Cardiology (Cardiology) Heloise Purpura, MD as Consulting Physician (Urology) Rollene Rotunda, MD as Consulting Physician (Cardiology) Estanislado Emms, MD as Consulting Physician (Nephrology) Malachy Mood, MD as Consulting Physician (Hematology and Oncology)  Date of Service:  06/26/2023  I connected with Adam Harris on 06/26/2023 at  1:40 PM EDT by {Blank single:19197::"video enabled telemedicine visit","telephone visit"} and verified that I am speaking with the correct person using two identifiers.  I discussed the limitations, risks, security and privacy concerns of performing an evaluation and management service by telephone and the availability of in person appointments. I also discussed with the patient that there may be a patient responsible charge related to this service. The patient expressed understanding and agreed to proceed.   Other persons participating in the visit and their role in the encounter:  ***  Patient's location:  *** Provider's location:  ***  CHIEF COMPLAINT: f/u of Recurrent urethral carcinoma in situ   CURRENT THERAPY:  Keytruda q3 weeks  ASSESSMENT & PLAN:  Adam Harris is a 77 y.o. 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  He has had multiple BCG maintenance therapy, is BCG refractory. -Given his BCG refractory disease, recurrence in bladder and the urethra, I recommend systemic  therapy with Keytruda every 3 weeks (or every 6 weeks if he tolerates well) for 2 years, per NCCN guideline.  He had no history of autoimmune disease, overall in good health, there is no contraindication for immunotherapy. --he started on 06/15/2023.   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   06/08/2023 Cancer Staging   Staging form: Urinary Bladder, AJCC 8th Edition - Clinical stage from 06/08/2023: Stage 0is (cTis, cN0, cM0) - Signed by Malachy Mood, MD on 06/09/2023 WHO/ISUP grade (low/high): High Grade Histologic grading system: 2 grade system   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 He was last seen by me on 06/08/2023.    All other systems were reviewed with the patient and are negative.  MEDICAL HISTORY:  Past Medical History:  Diagnosis Date   Abnormal radiologic findings on diagnostic imaging of renal pelvis, ureter, or  bladder    bilateral ureter abnormalities   Anticoagulant long-term use    eliquis   Anxiety    Arthritis    CAD (coronary artery disease) cardiologist-- dr hochrein   NSTEMI 02-04-2014  per cardiac cath chronic occluded RCA w/ faint left-to-right collaterals and aneurysmal LCFx with sluggish coronary  flow/   NSTEMI --11-21-2016 per cardiac cath occluded proximal RCA & mid to diastal CFX 100%, med rx. If that does not work, PTCA or CABG   Cancer [facility])    bladder and kidney   CKD (chronic kidney disease), stage III (HCC)    patient unaware   DOE (dyspnea on exertion)    Fatty liver    pt denies   Hematuria 02/2019   History of COVID-19 10/2019   History of non-ST elevation myocardial infarction (NSTEMI)    02-04-2014  and 11-21-2016  cardiac cath done both times ,  medically management   History of shingles 12/2017   slight pain and numbness still noted in the area   Hyperlipidemia    Hypertension    Insomnia    Myocardial infarction [facility]) 2015   Persistent atrial fibrillation [facility])    cardiologsit-- dr hochrein   Thoracic aortic atherosclerosis (HCC)    Type 2 diabetes mellitus (HCC)    Urinary frequency     SURGICAL HISTORY: Past Surgical History:  Procedure Laterality Date   CARDIAC CATHETERIZATION N/A 11/21/2016   Procedure: Left Heart Cath and Coronary Angiography;  Surgeon: Lennette Bihari, MD;  Location: [facility] INVASIVE CV LAB;  Service: Cardiovascular;  Laterality: N/A;  pRCA 100% , ostial LAD 45%, OM3 80%, mCFX to dCFX 100% (AV groove), lateral OM3 50%   COLONOSCOPY     CYSTOSCOPY WITH BIOPSY N/A 08/12/2020   Procedure: CYSTOSCOPY WITH BLADDER BIOPSY AND TRANSURETHRAL RESECTION OF BLADDER TUMOR;  Surgeon: Heloise Purpura, MD;  Location: [facility] ORS;  Service: Urology;  Laterality: N/A;   CYSTOSCOPY WITH BIOPSY N/A 11/10/2021   Procedure: CYSTOSCOPY WITH BLADDER BIOPSIES/ LEFT RETROGRADE;  Surgeon: Heloise Purpura, MD;  Location: [facility];  Service: Urology;  Laterality: N/A;   CYSTOSCOPY WITH BIOPSY Left 07/27/2022   Procedure: CYSTOSCOPY WITH  BLADDER BIOPSY, TRANSURETHRAL FULGERATION OF BLADDER, EXAM UNDER ANESTHESIA, LEFT RETROGRADE PYELOGRAM;  Surgeon: Heloise Purpura, MD;  Location: [facility];  Service: Urology;  Laterality: Left;   CYSTOSCOPY WITH BIOPSY N/A 04/23/2023   Procedure:  CYSTOSCOPY WITH BLADDER AND PROSTATIC URETHRAL BIOPSIES;  Surgeon: Heloise Purpura, MD;  Location: [facility];  Service: Urology;  Laterality: N/A;  60 MINUTES NEEDED FOR CASE   CYSTOSCOPY WITH URETEROSCOPY AND STENT PLACEMENT Right 12/15/2019   Procedure: CYSTOSCOPY WITH RIGHT RETROGRADE/ RIGHT URETEROSCOPY/ BIOPSY;  Surgeon: Ihor Gully, MD;  Location: [facility];  Service: Urology;  Laterality: Right;   CYSTOSCOPY/RETROGRADE/URETEROSCOPY Bilateral 03/18/2018   Procedure: CYSTOSCOPY/RETROGRADE/URETEROSCOPY.;  Surgeon: Ihor Gully, MD;  Location: [facility];  Service: Urology;  Laterality: Bilateral;   EYE SURGERY Bilateral    cateract in January 2023   HYDROCELE EXCISION Left 07/27/2022   Procedure: HYDROCELE REPAIR;  Surgeon: Heloise Purpura, MD;  Location: [facility];  Service: Urology;  Laterality: Left;  GENERAL ANESTHESIA WITH PARALYSIS   KNEE ARTHROSCOPY Right    LEFT HEART CATHETERIZATION WITH CORONARY ANGIOGRAM N/A 02/04/2014   Procedure: LEFT HEART CATHETERIZATION WITH CORONARY ANGIOGRAM;  Surgeon: Iran Ouch, MD;  Location: [facility] CATH LAB;  Service: Cardiovascular;  Laterality: N/A;  severe one-vessel CAD, chronically occluded RCA with faint left-to-right collaterals;  aneurysmal LCFx  with sluggish coronary flow;  normal LVSF w/ moderately elevated LVEDP (ostialOM2 20%, pOM3 20%, pD1 20%, mCFX 50%, diffuse 20% pCFX)   PROSTATE BIOPSY N/A 08/12/2020   Procedure: BIOPSY TRANSRECTAL ULTRASONIC PROSTATE (TUBP);  Surgeon: Heloise Purpura, MD;  Location: [facility];  Service: Urology;  Laterality: N/A;   ROBOT ASSITED LAPAROSCOPIC NEPHROURETERECTOMY Right 03/22/2020   Procedure: XI ROBOT ASSITED LAPAROSCOPIC NEPHROURETERECTOMY;  Surgeon: Heloise Purpura, MD;  Location: [facility];  Service: Urology;  Laterality: Right;   TRANSTHORACIC ECHOCARDIOGRAM  11-22-2016   dr hochrein   ef 55-60%/ mild MR and TR/ moderate LAE   TRANSURETHRAL RESECTION OF BLADDER TUMOR N/A 02/10/2021    Procedure: TRANSURETHRAL RESECTION OF BLADDER TUMOR (TURBT)/ CYSTOSCOPY/ LEFT RETROGRADE;  Surgeon: Heloise Purpura, MD;  Location: [facility];  Service: Urology;  Laterality: N/A;  GENERAL ANESTHESIA WITH PARALYSIS    I have reviewed the social history and family history with the patient and they are unchanged from previous note.  ALLERGIES:  is allergic to xarelto [rivaroxaban].  MEDICATIONS:  Current Outpatient Medications  Medication Sig Dispense Refill   amLODipine (NORVASC) 5 MG tablet Take 1 tablet (5 mg total) by mouth daily. 90 tablet 3   FARXIGA 10 MG TABS tablet Take 10 mg by mouth daily.     finasteride (PROSCAR) 5 MG tablet Take 5 mg by mouth daily.     furosemide (LASIX) 20 MG tablet Take 1 tablet (20 mg total) by mouth daily. 90 tablet 3   hydrOXYzine (VISTARIL) 25 MG capsule Take 1 capsule (25 mg total) by mouth every 8 (eight) hours as needed. 30 capsule 2   isosorbide mononitrate (IMDUR) 30 MG 24 hr tablet Take 1 tablet (30 mg total) by mouth daily. 90 tablet 1   metoprolol tartrate (LOPRESSOR) 25 MG tablet TAKE ONE TABLET BY MOUTH TWICE A DAY 180 tablet 2   mupirocin cream (BACTROBAN) 2 % Apply 1 Application topically 2 (two) times daily. 15 g 0   nitroGLYCERIN (NITROSTAT) 0.4 MG SL tablet TAKE ONE TABLET UNDER THE TONGUE EVERY FIVE MINUTES FOR THREE DOSES AS NEEDED FOR CHEST PAIN, CALL 911 IF 2ND DOSE DOESN'T HELP 25 tablet 4   olmesartan (BENICAR) 40 MG tablet Take 40 mg by mouth daily.     phenazopyridine (PYRIDIUM) 200 MG tablet Take 1 tablet (200 mg total) by mouth 3 (three) times daily as needed for pain. 20 tablet 0   rosuvastatin (CRESTOR) 40 MG tablet Take 1 tablet (40 mg total) by mouth every morning. Please keep scheduled appointment 90 tablet 3   sitaGLIPtin (JANUVIA) 25 MG tablet Take 1 tablet (25 mg total) by mouth daily. 90 tablet 3   sodium bicarbonate 650 MG tablet Take 650 mg by mouth 2 (two) times daily.     tamsulosin (FLOMAX) 0.4 MG CAPS capsule Take 1  capsule (0.4 mg total) by mouth daily. 30 capsule 3   No current facility-administered medications for this visit.    PHYSICAL EXAMINATION: ECOG PERFORMANCE STATUS: {CHL ONC ECOG PS:[id number]}  There were no vitals filed for this visit. Wt Readings from Last 3 Encounters:  06/15/23 218 lb 12.8 oz (99.2 kg)  06/08/23 214 lb 8 oz (97.3 kg)  05/04/23 220 lb (99.8 kg)    *** No vitals taken today, Exam not performed today  LABORATORY DATA:  I have reviewed the data as listed    Latest Ref Rng & Units 06/15/2023    1:33 PM 11/24/2022    9:16 AM 07/25/2022    8:25  AM  CBC  WBC 4.0 - 10.5 K/uL 9.3  10.5  9.8   Hemoglobin 13.0 - 17.0 g/dL 98.1  19.1  47.8   Hematocrit 39.0 - 52.0 % 43.9  44.8  43.3   Platelets 150 - 400 K/uL 145  155  157         Latest Ref Rng & Units 06/15/2023    1:33 PM 04/13/2023   11:20 AM 12/08/2022    2:19 PM  CMP  Glucose 70 - 99 mg/dL 295  621  308   BUN 8 - 23 mg/dL 33  39  38   Creatinine 0.61 - 1.24 mg/dL 6.57  8.46  9.62   Sodium 135 - 145 mmol/L 139  137  142   Potassium 3.5 - 5.1 mmol/L 3.8  4.7  4.7   Chloride 98 - 111 mmol/L 111  109  107   CO2 22 - 32 mmol/L 20  20  20    Calcium 8.9 - 10.3 mg/dL 9.3  8.7  9.6   Total Protein 6.5 - 8.1 g/dL 7.0     Total Bilirubin 0.3 - 1.2 mg/dL 2.2     Alkaline Phos 38 - 126 U/L 95     AST 15 - 41 U/L 16     ALT 0 - 44 U/L 11         RADIOGRAPHIC STUDIES: I have personally reviewed the radiological images as listed and agreed with the findings in the report. No results found.    No orders of the defined types were placed in this encounter.  All questions were answered. The patient knows to call the clinic with any problems, questions or concerns. No barriers to learning was detected. The total time spent in the appointment was {CHL ONC TIME VISIT - [id number]}.     Salome Holmes, CMA 06/26/2023   Carolin Coy am acting as scribe for Malachy Mood, MD.   {Add scribe attestation  statement}

## 2023-06-26 NOTE — Assessment & Plan Note (Signed)
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  He has had multiple BCG maintenance therapy, is BCG refractory. -Given his BCG refractory disease, recurrence in bladder and the urethra, I recommend systemic therapy with Keytruda every 3 weeks (or every 6 weeks if he tolerates well) for 2 years, per NCCN guideline.  He had no history of autoimmune disease, overall in good health, there is no contraindication for immunotherapy. --he started on 06/15/2023.

## 2023-06-28 ENCOUNTER — Encounter (INDEPENDENT_AMBULATORY_CARE_PROVIDER_SITE_OTHER): Payer: Self-pay

## 2023-07-02 ENCOUNTER — Encounter: Payer: Self-pay | Admitting: Hematology

## 2023-07-02 NOTE — Progress Notes (Signed)
I called pt for phone visit and did not get an answer, left a message for him to call back.  Adam Harris

## 2023-07-04 ENCOUNTER — Encounter: Payer: Self-pay | Admitting: Hematology

## 2023-07-04 NOTE — Progress Notes (Signed)
Called pt to introduce myself as his Dance movement psychotherapist and to discuss the [employer].  I went over what it covers and went over the income requirement and he's over the income limit so unfortunately he doesn't qualify for the grant at this time.

## 2023-07-05 ENCOUNTER — Inpatient Hospital Stay (HOSPITAL_BASED_OUTPATIENT_CLINIC_OR_DEPARTMENT_OTHER): Payer: Medicare PPO | Admitting: Nurse Practitioner

## 2023-07-05 ENCOUNTER — Inpatient Hospital Stay: Payer: Medicare PPO

## 2023-07-05 VITALS — BP 156/86 | HR 66 | Temp 97.7°F | Resp 17 | Wt 216.4 lb

## 2023-07-05 DIAGNOSIS — C679 Malignant neoplasm of bladder, unspecified: Secondary | ICD-10-CM | POA: Diagnosis not present

## 2023-07-05 DIAGNOSIS — N390 Urinary tract infection, site not specified: Secondary | ICD-10-CM

## 2023-07-05 DIAGNOSIS — R319 Hematuria, unspecified: Secondary | ICD-10-CM | POA: Diagnosis not present

## 2023-07-05 DIAGNOSIS — R350 Frequency of micturition: Secondary | ICD-10-CM | POA: Diagnosis not present

## 2023-07-05 DIAGNOSIS — E119 Type 2 diabetes mellitus without complications: Secondary | ICD-10-CM | POA: Diagnosis not present

## 2023-07-05 DIAGNOSIS — C689 Malignant neoplasm of urinary organ, unspecified: Secondary | ICD-10-CM

## 2023-07-05 DIAGNOSIS — C680 Malignant neoplasm of urethra: Secondary | ICD-10-CM | POA: Diagnosis not present

## 2023-07-05 DIAGNOSIS — I10 Essential (primary) hypertension: Secondary | ICD-10-CM | POA: Diagnosis not present

## 2023-07-05 DIAGNOSIS — Z79899 Other long term (current) drug therapy: Secondary | ICD-10-CM | POA: Diagnosis not present

## 2023-07-05 DIAGNOSIS — Z5112 Encounter for antineoplastic immunotherapy: Secondary | ICD-10-CM | POA: Diagnosis not present

## 2023-07-05 LAB — CMP (CANCER CENTER ONLY)
ALT: 13 U/L (ref 0–44)
AST: 17 U/L (ref 15–41)
Albumin: 4.1 g/dL (ref 3.5–5.0)
Alkaline Phosphatase: 94 U/L (ref 38–126)
Anion gap: 7 (ref 5–15)
BUN: 32 mg/dL — ABNORMAL HIGH (ref 8–23)
CO2: 23 mmol/L (ref 22–32)
Calcium: 9.6 mg/dL (ref 8.9–10.3)
Chloride: 111 mmol/L (ref 98–111)
Creatinine: 2.64 mg/dL — ABNORMAL HIGH (ref 0.61–1.24)
GFR, Estimated: 24 mL/min — ABNORMAL LOW (ref >60)
Glucose, Bld: 111 mg/dL — ABNORMAL HIGH (ref 70–99)
Potassium: 3.9 mmol/L (ref 3.5–5.1)
Sodium: 141 mmol/L (ref 135–145)
Total Bilirubin: 3 mg/dL — ABNORMAL HIGH (ref 0.3–1.2)
Total Protein: 7.7 g/dL (ref 6.5–8.1)

## 2023-07-05 LAB — CBC WITH DIFFERENTIAL (CANCER CENTER ONLY)
Abs Immature Granulocytes: 0.02 10*3/uL (ref 0.00–0.07)
Basophils Absolute: 0.1 10*3/uL (ref 0.0–0.1)
Basophils Relative: 1 %
Eosinophils Absolute: 0.2 10*3/uL (ref 0.0–0.5)
Eosinophils Relative: 2 %
HCT: 49.1 % (ref 39.0–52.0)
Hemoglobin: 16.2 g/dL (ref 13.0–17.0)
Immature Granulocytes: 0 %
Lymphocytes Relative: 8 %
Lymphs Abs: 0.7 10*3/uL (ref 0.7–4.0)
MCH: 29 pg (ref 26.0–34.0)
MCHC: 33 g/dL (ref 30.0–36.0)
MCV: 88 fL (ref 80.0–100.0)
Monocytes Absolute: 0.9 10*3/uL (ref 0.1–1.0)
Monocytes Relative: 10 %
Neutro Abs: 6.9 10*3/uL (ref 1.7–7.7)
Neutrophils Relative %: 79 %
Platelet Count: 134 10*3/uL — ABNORMAL LOW (ref 150–400)
RBC: 5.58 MIL/uL (ref 4.22–5.81)
RDW: 15 % (ref 11.5–15.5)
WBC Count: 8.9 10*3/uL (ref 4.0–10.5)
nRBC: 0 % (ref 0.0–0.2)

## 2023-07-05 LAB — URINALYSIS, ROUTINE W REFLEX MICROSCOPIC
Bacteria, UA: NONE SEEN
Bilirubin Urine: NEGATIVE
Glucose, UA: >=500 mg/dL — AB
Ketones, ur: NEGATIVE mg/dL
Leukocytes,Ua: NEGATIVE
Nitrite: POSITIVE — AB
Protein, ur: 100 mg/dL — AB
RBC / HPF: >50 RBC/hpf (ref 0–5)
Specific Gravity, Urine: 1.015 (ref 1.005–1.030)
WBC, UA: >50 WBC/hpf (ref 0–5)
pH: 5 (ref 5.0–8.0)

## 2023-07-05 MED ORDER — OXYBUTYNIN CHLORIDE ER 5 MG PO TB24
5.0000 mg | ORAL_TABLET | Freq: Every day | ORAL | 0 refills | Status: DC
Start: 2023-07-05 — End: 2023-10-08

## 2023-07-05 MED ORDER — SODIUM CHLORIDE 0.9 % IV SOLN: Freq: Once | INTRAVENOUS | Status: AC

## 2023-07-05 MED ORDER — SODIUM CHLORIDE 0.9 % IV SOLN
200.0000 mg | Freq: Once | INTRAVENOUS | Status: AC
  Administered 2023-07-05: 200 mg via INTRAVENOUS
  Filled 2023-07-05: qty 8

## 2023-07-05 NOTE — Progress Notes (Signed)
Per Mosetta Putt MD, ok to treat today with Bilirubin 3.0 and SCR 2.64.

## 2023-07-05 NOTE — Assessment & Plan Note (Addendum)
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  He has had multiple BCG maintenance therapy, is BCG refractory. -Given his BCG refractory disease, recurrence in bladder and the urethra, I recommend systemic therapy with Keytruda every 3 weeks (or every 6 weeks if he tolerates well) for 2 years, per NCCN guideline.  He had no history of autoimmune disease, overall in good health, there is no contraindication for immunotherapy. --he started on 06/15/2023.  -07/05/2023 - he presents for Cycle 2 day 1 Keytruda  Labs reviewed  -CBC showing WBC 8.9; Hgb 16.2; Hct 49.1; Plt 134; Anc 6.9 -CMP - K 3.9; glucose 111; BUN 32; Creatinine 2.64; eGFR 24; Ca 9.6; LFTs normal with t.Bili 3.0.   He complains of urinary frequency and pain while urinating. He states that these symptoms are generally present, but they have recently been worse than normal. States that he has been evaluated per his urologist. States that he did not have infection when he saw urology last.  He denies chest pain, chest pressure, or shortness of breath. He denies headaches or visual disturbances. He denies abdominal pain, nausea, vomiting, or changes in bowel habits.

## 2023-07-05 NOTE — Progress Notes (Addendum)
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  He has had multiple BCG maintenance therapy, is BCG refractory. -Given his BCG refractory disease, recurrence in bladder and the urethra, I recommend systemic therapy with Keytruda every 3 weeks (or every 6 weeks if he tolerates well) for 2 years, per NCCN guideline.  He had no history of autoimmune disease, overall in good health, there is no contraindication for immunotherapy. --he started on 06/15/2023.  -07/05/2023 - he presents for Cycle 2 day 1 Keytruda  Labs reviewed  -CBC showing WBC 8.9; Hgb 16.2; Hct 49.1; Plt 134; Anc 6.9 -CMP - K 3.9; glucose 111; BUN 32; Creatinine 2.64; eGFR 24; Ca 9.6; LFTs normal with t.Bili 3.0.   He complains of urinary frequency and pain while urinating. He states that these symptoms are generally present, but they have recently been worse than normal. States that he has been evaluated per his urologist. States that he did not have infection when he saw urology last.  He denies chest pain, chest pressure, or shortness of breath. He denies headaches or visual disturbances. He denies abdominal pain,  nausea, vomiting, or changes in bowel habits.    Plan: .Labs reviewed  -CBC showing WBC 8.9; Hgb 16.2; Hct 49.1; Plt 134; Anc 6.9 -CMP - K 3.9; glucose 111; BUN 32; Creatinine 2.64; eGFR 24; Ca 9.6; LFTs normal with t.Bili 3.0.   Evaluate for uti today and treat as indicated.  Proceed with Keytruda today, Cycle 2 Day 1.  Labs/flush, follow up, and treatment as scheduled.  The patient understands the plans discussed today and is in agreement with them.  He knows to contact our office if he develops concerns prior to his next appointment.  I provided 30 minutes of face-to-face time during this encounter and > 50% was spent counseling as documented under my assessment and plan.    Carlean Jews, NP  [facility] [address] [place] [address] Dept: [phone] Dept Fax: [phone]   No orders of the defined types were placed in this encounter.     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 He denies fevers or chills. He denies pain. His appetite is good. His weight has decreased 2 pounds over last month .    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  -He has had multiple recurrence of  Ta and Tis urothelial carcinoma in bladder since then, with the most recent one in June 2024, also involve the urethra of prostate.  He has had multiple BCG maintenance therapy, is BCG refractory. -Given his BCG refractory disease, recurrence in bladder and the urethra, I recommend systemic therapy with Keytruda every 3 weeks (or every 6 weeks if he tolerates well) for 2 years,  per NCCN guideline.  He had no history of autoimmune disease, overall in good health, there is no contraindication for immunotherapy. -continues regular follow up appointments with urology provider.  -will treat for uti with antibiotics as indicated per u/a done today -continue with Keytruda every 3 weeks.   I have reviewed the past medical history, past surgical history, social history and family history with the patient and they are unchanged from previous note.  ALLERGIES:  is allergic to xarelto [rivaroxaban].  MEDICATIONS:  Current Outpatient Medications  Medication Sig Dispense Refill   amLODipine (NORVASC) 5 MG tablet Take 1 tablet (5 mg total) by mouth daily. 90 tablet 3   cephALEXin (KEFLEX) 500 MG capsule Take 1 capsule (500 mg total) by mouth 3 (three) times daily. 21 capsule 0   FARXIGA 10 MG TABS tablet Take 10 mg by mouth daily.     finasteride (PROSCAR) 5 MG tablet Take 5 mg by mouth daily.     furosemide (LASIX) 20 MG tablet Take 1 tablet (20 mg total) by mouth daily. 90 tablet 3   hydrOXYzine (VISTARIL) 25 MG capsule Take 1 capsule (25 mg total) by mouth every 8 (eight) hours as needed. 30 capsule 2   isosorbide mononitrate (IMDUR) 30 MG 24 hr tablet Take 1 tablet (30 mg total) by mouth daily. 90 tablet 1   metoprolol tartrate (LOPRESSOR) 25 MG tablet TAKE ONE TABLET BY MOUTH TWICE A DAY 180 tablet 2   nitroGLYCERIN (NITROSTAT) 0.4 MG SL tablet TAKE ONE TABLET UNDER THE TONGUE EVERY FIVE MINUTES FOR THREE DOSES AS NEEDED FOR CHEST PAIN, CALL 911 IF 2ND DOSE DOESN'T HELP 25 tablet 4   olmesartan (BENICAR) 40 MG tablet Take 40 mg by mouth daily.     oxybutynin (DITROPAN-XL) 5 MG 24 hr tablet Take 1 tablet (5 mg total) by mouth at bedtime. 30 tablet 0   rosuvastatin (CRESTOR) 40 MG tablet Take 1 tablet (40 mg total) by mouth every morning. Please keep scheduled appointment 90 tablet 3   sitaGLIPtin (JANUVIA) 25 MG tablet Take 1 tablet (25 mg total) by mouth daily. 90 tablet 3    sodium bicarbonate 650 MG tablet Take 650 mg by mouth 2 (two) times daily.     tamsulosin (FLOMAX) 0.4 MG CAPS capsule Take 1 capsule (0.4 mg total) by mouth daily. 30 capsule 3   mupirocin cream (BACTROBAN) 2 % Apply 1 Application topically 2 (two) times daily. (Patient not taking: Reported on 07/05/2023) 15 g 0   No current facility-administered medications for this visit.    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   06/08/2023 Cancer Staging   Staging form: Urinary  Bladder, AJCC 8th Edition - Clinical stage from 06/08/2023: Stage 0is (cTis, cN0, cM0) - Signed by Malachy Mood, MD on 06/09/2023 WHO/ISUP grade (low/high): High Grade Histologic grading system: 2 grade system   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)   06/15/2023 -  Chemotherapy   Patient is on Treatment Plan : BLADDER Pembrolizumab (200) q21d         REVIEW OF SYSTEMS:   Constitutional: Denies fevers, chills or abnormal weight loss Eyes: Denies blurriness of vision Ears, nose, mouth, throat, and face: Denies mucositis or sore throat Respiratory: Denies cough, dyspnea or wheezes Cardiovascular: Denies palpitation, chest discomfort or lower extremity swelling Gastrointestinal:  Denies nausea, heartburn or change in bowel habits Skin: Denies abnormal skin rashes Lymphatics: Denies new lymphadenopathy or easy bruising Neurological:Denies numbness, tingling or new  weaknesses Behavioral/Psych: Mood is stable, no new changes  GU: urinary frequency and burning during urination.  All other systems were reviewed with the patient and are negative.   VITALS:   Today's Vitals   07/05/23 1327 07/05/23 1334  BP: (!) 156/86   Pulse: 66   Resp: 17   Temp: 97.7 F (36.5 C)   TempSrc: Oral   SpO2: 97%   Weight: 216 lb 6.4 oz (98.2 kg)   PainSc:  0-No pain   Body mass index is 31.05 kg/m.   Wt Readings from Last 3 Encounters:  07/05/23 216 lb 6.4 oz (98.2 kg)  06/15/23 218 lb 12.8 oz (99.2 kg)  06/08/23 214 lb 8 oz (97.3 kg)    Body mass index is 31.05 kg/m.  Performance status (ECOG): 1 - Symptomatic but completely ambulatory  PHYSICAL EXAM:   GENERAL:alert, no distress and comfortable SKIN: skin color, texture, turgor are normal, no rashes or significant lesions EYES: normal, Conjunctiva are pink and non-injected, sclera clear OROPHARYNX:no exudate, no erythema and lips, buccal mucosa, and tongue normal  NECK: supple, thyroid normal size, non-tender, without nodularity LYMPH:  no palpable lymphadenopathy in the cervical, axillary or inguinal LUNGS: clear to auscultation and percussion with normal breathing effort HEART: regular rate & rhythm and no murmurs and no lower extremity edema ABDOMEN:abdomen soft, non-tender and normal bowel sounds Musculoskeletal:no cyanosis of digits and no clubbing  NEURO: alert & oriented x 3 with fluent speech, no focal motor/sensory deficits  LABORATORY DATA:  I have reviewed the data as listed    Component Value Date/Time   NA 141 07/05/2023 1248   NA 142 12/08/2022 1419   K 3.9 07/05/2023 1248   CL 111 07/05/2023 1248   CO2 23 07/05/2023 1248   GLUCOSE 111 (H) 07/05/2023 1248   BUN 32 (H) 07/05/2023 1248   BUN 38 (H) 12/08/2022 1419   CREATININE 2.64 (H) 07/05/2023 1248   CREATININE 1.77 (H) 01/01/2017 0810   CALCIUM 9.6 07/05/2023 1248   PROT 7.7 07/05/2023 1248   ALBUMIN 4.1 07/05/2023 1248    AST 17 07/05/2023 1248   ALT 13 07/05/2023 1248   ALKPHOS 94 07/05/2023 1248   BILITOT 3.0 (H) 07/05/2023 1248   GFRNONAA 24 (L) 07/05/2023 1248   GFRAA 32 (L) 08/05/2020 0826     Lab Results  Component Value Date   WBC 8.9 07/05/2023   NEUTROABS 6.9 07/05/2023   HGB 16.2 07/05/2023   HCT 49.1 07/05/2023   MCV 88.0 07/05/2023   PLT 134 (L) 07/05/2023   Addendum I have seen the patient, examined him. I  agree with the assessment and and plan and have edited the notes.   Adam Harris tolerated first cycle Keytruda well overall, with mild fatigue, no other significant side effects.  Lab reviewed, adequate for treatment, will proceed to cycle 2 today.  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  We again reviewed what to watch at home after treatment, he knows to call us if he has significant side effects. I spent a total of 25 minutes for his visit today, more than 50% time on face-to-face counseling.  Malachy Mood MD 07/05/2023

## 2023-07-05 NOTE — Patient Instructions (Signed)

## 2023-07-06 ENCOUNTER — Other Ambulatory Visit: Payer: Self-pay | Admitting: Family Medicine

## 2023-07-06 ENCOUNTER — Telehealth: Payer: Self-pay

## 2023-07-06 DIAGNOSIS — E1159 Type 2 diabetes mellitus with other circulatory complications: Secondary | ICD-10-CM

## 2023-07-06 MED ORDER — CEPHALEXIN 500 MG PO CAPS
500.0000 mg | ORAL_CAPSULE | Freq: Three times a day (TID) | ORAL | 0 refills | Status: DC
Start: 2023-07-06 — End: 2023-08-06

## 2023-07-06 NOTE — Telephone Encounter (Signed)
Spoke to w/pt regarding UA results.  Informed pt that he has a UTI and that Vincent Gros, NP sent in a prescription for Keflex to pt's preferred pharmacy for him to take 3 times daily for 3 days.  Pt verbalized understanding and had no further questions or concerns.

## 2023-07-06 NOTE — Telephone Encounter (Signed)
Requesting: JANUVIA 25MG  TAB  Last Visit: 05/04/2023 Next Visit: 08/06/2023 Last Refill: 05/02/2022  Please Advise

## 2023-07-13 ENCOUNTER — Encounter: Payer: Self-pay | Admitting: Hematology

## 2023-07-26 ENCOUNTER — Inpatient Hospital Stay: Payer: Medicare PPO

## 2023-07-26 ENCOUNTER — Encounter: Payer: Self-pay | Admitting: Hematology

## 2023-07-26 ENCOUNTER — Ambulatory Visit (HOSPITAL_COMMUNITY)
Admission: RE | Admit: 2023-07-26 | Discharge: 2023-07-26 | Disposition: A | Discharge status: Home / Self Care | Payer: Medicare PPO | Source: Ambulatory Visit | Attending: Hematology | Admitting: Hematology

## 2023-07-26 ENCOUNTER — Inpatient Hospital Stay: Payer: Medicare PPO | Attending: Hematology | Admitting: Hematology

## 2023-07-26 VITALS — BP 144/86 | HR 59 | Temp 98.0°F | Resp 17 | Wt 221.3 lb

## 2023-07-26 DIAGNOSIS — C689 Malignant neoplasm of urinary organ, unspecified: Secondary | ICD-10-CM | POA: Insufficient documentation

## 2023-07-26 DIAGNOSIS — C680 Malignant neoplasm of urethra: Secondary | ICD-10-CM | POA: Diagnosis not present

## 2023-07-26 DIAGNOSIS — Z79899 Other long term (current) drug therapy: Secondary | ICD-10-CM | POA: Insufficient documentation

## 2023-07-26 DIAGNOSIS — R5383 Other fatigue: Secondary | ICD-10-CM | POA: Insufficient documentation

## 2023-07-26 DIAGNOSIS — R059 Cough, unspecified: Secondary | ICD-10-CM | POA: Insufficient documentation

## 2023-07-26 DIAGNOSIS — Z7901 Long term (current) use of anticoagulants: Secondary | ICD-10-CM | POA: Diagnosis not present

## 2023-07-26 DIAGNOSIS — C679 Malignant neoplasm of bladder, unspecified: Secondary | ICD-10-CM

## 2023-07-26 DIAGNOSIS — R0609 Other forms of dyspnea: Secondary | ICD-10-CM | POA: Insufficient documentation

## 2023-07-26 DIAGNOSIS — R0600 Dyspnea, unspecified: Secondary | ICD-10-CM | POA: Diagnosis not present

## 2023-07-26 DIAGNOSIS — Z5112 Encounter for antineoplastic immunotherapy: Secondary | ICD-10-CM | POA: Diagnosis not present

## 2023-07-26 LAB — CMP (CANCER CENTER ONLY)
ALT: 29 U/L (ref 0–44)
AST: 28 U/L (ref 15–41)
Albumin: 3.9 g/dL (ref 3.5–5.0)
Alkaline Phosphatase: 78 U/L (ref 38–126)
Anion gap: 7 (ref 5–15)
BUN: 33 mg/dL — ABNORMAL HIGH (ref 8–23)
CO2: 22 mmol/L (ref 22–32)
Calcium: 9.6 mg/dL (ref 8.9–10.3)
Chloride: 111 mmol/L (ref 98–111)
Creatinine: 2.55 mg/dL — ABNORMAL HIGH (ref 0.61–1.24)
GFR, Estimated: 25 mL/min — ABNORMAL LOW (ref >60)
Glucose, Bld: 152 mg/dL — ABNORMAL HIGH (ref 70–99)
Potassium: 4.2 mmol/L (ref 3.5–5.1)
Sodium: 140 mmol/L (ref 135–145)
Total Bilirubin: 3.3 mg/dL — ABNORMAL HIGH (ref 0.3–1.2)
Total Protein: 7.1 g/dL (ref 6.5–8.1)

## 2023-07-26 LAB — CBC WITH DIFFERENTIAL (CANCER CENTER ONLY)
Abs Immature Granulocytes: 0.02 10*3/uL (ref 0.00–0.07)
Basophils Absolute: 0.1 10*3/uL (ref 0.0–0.1)
Basophils Relative: 1 %
Eosinophils Absolute: 0.3 10*3/uL (ref 0.0–0.5)
Eosinophils Relative: 3 %
HCT: 44.4 % (ref 39.0–52.0)
Hemoglobin: 14.6 g/dL (ref 13.0–17.0)
Immature Granulocytes: 0 %
Lymphocytes Relative: 16 %
Lymphs Abs: 1.4 10*3/uL (ref 0.7–4.0)
MCH: 29 pg (ref 26.0–34.0)
MCHC: 32.9 g/dL (ref 30.0–36.0)
MCV: 88.3 fL (ref 80.0–100.0)
Monocytes Absolute: 1.1 10*3/uL — ABNORMAL HIGH (ref 0.1–1.0)
Monocytes Relative: 12 %
Neutro Abs: 6 10*3/uL (ref 1.7–7.7)
Neutrophils Relative %: 68 %
Platelet Count: 135 10*3/uL — ABNORMAL LOW (ref 150–400)
RBC: 5.03 MIL/uL (ref 4.22–5.81)
RDW: 16.2 % — ABNORMAL HIGH (ref 11.5–15.5)
WBC Count: 8.9 10*3/uL (ref 4.0–10.5)
nRBC: 0 % (ref 0.0–0.2)

## 2023-07-26 LAB — TSH: TSH: 4.003 u[IU]/mL (ref 0.350–4.500)

## 2023-07-26 NOTE — Progress Notes (Signed)
Patient seen by Dr. America Brown are within treatment parameters.  Labs reviewed: bilirubin is 3.3   Per physician team, patient will not be receiving treatment today.

## 2023-07-26 NOTE — Assessment & Plan Note (Signed)
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  He has had multiple BCG maintenance therapy, is BCG refractory. -Given his BCG refractory disease, recurrence in bladder and the urethra, I recommend systemic therapy with Keytruda every 3 weeks (or every 6 weeks if he tolerates well) for 2 years, per NCCN guideline.  He had no history of autoimmune disease, overall in good health, there is no contraindication for immunotherapy. --he started on 06/15/2023.

## 2023-07-26 NOTE — Progress Notes (Signed)
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  He has had multiple BCG maintenance therapy, is BCG refractory. -Given his BCG refractory disease, recurrence in bladder and the urethra, I recommend systemic therapy with Keytruda every 3 weeks (or every 6 weeks if he tolerates well) for 2 years, per NCCN guideline.  He had no history of autoimmune disease, overall in good health, there is no contraindication for immunotherapy. --he started on 06/15/2023.  -Status post 2 cycle Keytruda.  He reports mild to moderate dyspnea on exertion, and more fatigued lately. 6 mins walking showed lowest O2sat 89% -This time also showed increasing total bilirubin (he has chronic mild hyperbilirubinemia, worse today).  Will hold treatment today.    PLAN: -I order  Chest X-ray today -pt did a 6 min walking today -lab reviewed -CMP bilirubin is 3.3  -No treatment today  -will likely need CT  chest -postpone treatment to next week.   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   06/08/2023 Cancer Staging   Staging form: Urinary Bladder, AJCC 8th Edition - Clinical stage from 06/08/2023: Stage 0is (cTis, cN0, cM0) - Signed by Malachy Mood, MD on 06/09/2023 WHO/ISUP grade (low/high): High Grade Histologic grading system: 2 grade system   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 He presents to the clinic accompanied by wife. Pt state that he coughs a little more, than usual. Sometimes he cough in morning and at night. Pt state that he has some dyspnea when being  mobile. Pt state that he has a problem with the urgency to urinate. Pt is able to care of himself. Pt report that his energy  level has decrease. Pt appetite is good.      All other systems were reviewed with the patient and are negative.  MEDICAL HISTORY:  Past Medical History:  Diagnosis Date   Abnormal radiologic findings on diagnostic imaging of renal pelvis, ureter, or bladder    bilateral ureter abnormalities   Anticoagulant long-term use    eliquis   Anxiety    Arthritis    CAD (coronary artery disease) cardiologist--  dr hochrein   NSTEMI 02-04-2014  per cardiac cath chronic occluded RCA w/ faint left-to-right collaterals and aneurysmal LCFx with sluggish coronary flow/   NSTEMI --11-21-2016 per cardiac cath occluded proximal RCA & mid to diastal CFX 100%, med rx. If that does not work, PTCA or CABG   Cancer [facility])    bladder and kidney   CKD (chronic kidney disease), stage III (HCC)    patient unaware   DOE (dyspnea on exertion)    Fatty liver    pt denies   Hematuria 02/2019   History of COVID-19 10/2019   History of non-ST elevation myocardial infarction (NSTEMI)    02-04-2014  and 11-21-2016  cardiac cath done both times ,  medically management   History of shingles 12/2017   slight pain and numbness still noted in the area   Hyperlipidemia    Hypertension    Insomnia    Myocardial infarction [facility]) 2015   Persistent atrial fibrillation [facility])    cardiologsit-- dr hochrein   Thoracic aortic atherosclerosis (HCC)    Type 2 diabetes mellitus (HCC)    Urinary frequency     SURGICAL HISTORY: Past Surgical History:  Procedure Laterality Date   CARDIAC CATHETERIZATION N/A 11/21/2016   Procedure: Left Heart Cath and Coronary Angiography;  Surgeon: Lennette Bihari, MD;  Location: [facility] INVAS[facility];  Service: Cardiovascular;  Laterality: N/A;  pRCA 100% , ostial LAD 45%, OM3 80%, mCFX to dCFX 100% (AV groove), lateral OM3 50%   COLONOSCOPY     CYSTOSCOPY WITH BIOPSY N/A 08/12/2020   Procedure: CYSTOSCOPY WITH BLADDER BIOPSY AND TRANSURETHRAL RESECTION OF BLADDER TUMOR;  Surgeon: Heloise Purpura, MD;  Location: [facility] ORS;  Service: Urology;  Laterality: N/A;   CYSTOSCOPY WITH BIOPSY N/A 11/10/2021   Procedure: CYSTOSCOPY WITH BLADDER BIOPSIES/ LEFT RETROGRADE;  Surgeon: Heloise Purpura, MD;  Location: [facility];  Service: Urology;  Laterality: N/A;   CYSTOSCOPY WITH BIOPSY Left 07/27/2022   Procedure: CYSTOSCOPY WITH  BLADDER BIOPSY, TRANSURETHRAL FULGERATION OF BLADDER, EXAM UNDER ANESTHESIA, LEFT RETROGRADE  PYELOGRAM;  Surgeon: Heloise Purpura, MD;  Location: [facility];  Service: Urology;  Laterality: Left;   CYSTOSCOPY WITH BIOPSY N/A 04/23/2023   Procedure: CYSTOSCOPY WITH BLADDER AND PROSTATIC URETHRAL BIOPSIES;  Surgeon: Heloise Purpura, MD;  Location: [facility];  Service: Urology;  Laterality: N/A;  60 MINUTES NEEDED FOR CASE   CYSTOSCOPY WITH URETEROSCOPY AND STENT PLACEMENT Right 12/15/2019   Procedure: CYSTOSCOPY WITH RIGHT RETROGRADE/ RIGHT URETEROSCOPY/ BIOPSY;  Surgeon: Ihor Gully, MD;  Location: [facility];  Service: Urology;  Laterality: Right;   CYSTOSCOPY/RETROGRADE/URETEROSCOPY Bilateral 03/18/2018   Procedure: CYSTOSCOPY/RETROGRADE/URETEROSCOPY.;  Surgeon: Ihor Gully, MD;  Location: [facility]  Service: Urology;  Laterality: Bilateral;   EYE SURGERY Bilateral    cateract in January 2023   HYDROCELE EXCISION Left 07/27/2022   Procedure: HYDROCELE REPAIR;  Surgeon: Heloise Purpura, MD;  Location: [facility];  Service: Urology;  Laterality: Left;  GENERAL ANESTHESIA WITH PARALYSIS   KNEE ARTHROSCOPY Right    LEFT HEART  CATHETERIZATION WITH CORONARY ANGIOGRAM N/A 02/04/2014   Procedure: LEFT HEART CATHETERIZATION WITH CORONARY ANGIOGRAM;  Surgeon: Iran Ouch, MD;  Location: [facility] CATH [facility];  Service: Cardiovascular;  Laterality: N/A;  severe one-vessel CAD, chronically occluded RCA with faint left-to-right collaterals;  aneurysmal LCFx with sluggish coronary flow;  normal LVSF w/ moderately elevated LVEDP (ostialOM2 20%, pOM3 20%, pD1 20%, mCFX 50%, diffuse 20% pCFX)   PROSTATE BIOPSY N/A 08/12/2020   Procedure: BIOPSY TRANSRECTAL ULTRASONIC PROSTATE (TUBP);  Surgeon: Heloise Purpura, MD;  Location: [facility];  Service: Urology;  Laterality: N/A;   ROBOT ASSITED LAPAROSCOPIC NEPHROURETERECTOMY Right 03/22/2020   Procedure: XI ROBOT ASSITED LAPAROSCOPIC NEPHROURETERECTOMY;  Surgeon: Heloise Purpura, MD;  Location: [facility];  Service: Urology;  Laterality: Right;    TRANSTHORACIC ECHOCARDIOGRAM  11-22-2016   dr hochrein   ef 55-60%/ mild MR and TR/ moderate LAE   TRANSURETHRAL RESECTION OF BLADDER TUMOR N/A 02/10/2021   Procedure: TRANSURETHRAL RESECTION OF BLADDER TUMOR (TURBT)/ CYSTOSCOPY/ LEFT RETROGRADE;  Surgeon: Heloise Purpura, MD;  Location: [facility];  Service: Urology;  Laterality: N/A;  GENERAL ANESTHESIA WITH PARALYSIS    I have reviewed the social history and family history with the patient and they are unchanged from previous note.  ALLERGIES:  is allergic to xarelto [rivaroxaban].  MEDICATIONS:  Current Outpatient Medications  Medication Sig Dispense Refill   amLODipine (NORVASC) 5 MG tablet Take 1 tablet (5 mg total) by mouth daily. 90 tablet 3   cephALEXin (KEFLEX) 500 MG capsule Take 1 capsule (500 mg total) by mouth 3 (three) times daily. 21 capsule 0   FARXIGA 10 MG TABS tablet Take 10 mg by mouth daily.     finasteride (PROSCAR) 5 MG tablet Take 5 mg by mouth daily.     furosemide (LASIX) 20 MG tablet Take 1 tablet (20 mg total) by mouth daily. 90 tablet 3   hydrOXYzine (VISTARIL) 25 MG capsule Take 1 capsule (25 mg total) by mouth every 8 (eight) hours as needed. 30 capsule 2   isosorbide mononitrate (IMDUR) 30 MG 24 hr tablet Take 1 tablet (30 mg total) by mouth daily. 90 tablet 1   metoprolol tartrate (LOPRESSOR) 25 MG tablet TAKE ONE TABLET BY MOUTH TWICE A DAY 180 tablet 2   mupirocin cream (BACTROBAN) 2 % Apply 1 Application topically 2 (two) times daily. (Patient not taking: Reported on 07/05/2023) 15 g 0   nitroGLYCERIN (NITROSTAT) 0.4 MG SL tablet TAKE ONE TABLET UNDER THE TONGUE EVERY FIVE MINUTES FOR THREE DOSES AS NEEDED FOR CHEST PAIN, CALL 911 IF 2ND DOSE DOESN'T HELP 25 tablet 4   olmesartan (BENICAR) 40 MG tablet Take 40 mg by mouth daily.     oxybutynin (DITROPAN-XL) 5 MG 24 hr tablet Take 1 tablet (5 mg total) by mouth at bedtime. 30 tablet 0   rosuvastatin (CRESTOR) 40 MG tablet Take 1 tablet (40 mg total) by mouth  every morning. Please keep scheduled appointment 90 tablet 3   sitaGLIPtin (JANUVIA) 25 MG tablet TAKE ONE TABLET BY MOUTH DAILY 60 tablet 0   sodium bicarbonate 650 MG tablet Take 650 mg by mouth 2 (two) times daily.     tamsulosin (FLOMAX) 0.4 MG CAPS capsule Take 1 capsule (0.4 mg total) by mouth daily. 30 capsule 3   No current facility-administered medications for this visit.    PHYSICAL EXAMINATION: ECOG PERFORMANCE STATUS: 1 - Symptomatic but completely ambulatory  Vitals:   07/26/23 1415  BP: (!) 144/86  Pulse: (!) 59  Resp:  17  Temp: 98 F (36.7 C)  SpO2: 94%   Wt Readings from Last 3 Encounters:  07/26/23 221 lb 4.8 oz (100.4 kg)  07/05/23 216 lb 6.4 oz (98.2 kg)  06/15/23 218 lb 12.8 oz (99.2 kg)     GENERAL:alert, no distress and comfortable SKIN: skin color normal, no rashes or significant lesions EYES: normal, Conjunctiva are pink and non-injected, sclera clear  NEURO: alert & oriented x 3 with fluent speech LUNGS:(-) clear to auscultation and percussion with normal breathing effort HEART: (+)regular rate & rhythm and no murmurs and no lower extremity edema  LABORATORY DATA:  I have reviewed the data as listed    Latest Ref Rng & Units 07/26/2023    1:45 PM 07/05/2023   12:48 PM 06/15/2023    1:33 PM  CBC  WBC 4.0 - 10.5 K/uL 8.9  8.9  9.3   Hemoglobin 13.0 - 17.0 g/dL 16.1  09.6  04.5   Hematocrit 39.0 - 52.0 % 44.4  49.1  43.9   Platelets 150 - 400 K/uL 135  134  145         Latest Ref Rng & Units 07/05/2023   12:48 PM 06/15/2023    1:33 PM 04/13/2023   11:20 AM  CMP  Glucose 70 - 99 mg/dL 409  811  914   BUN 8 - 23 mg/dL 32  33  39   Creatinine 0.61 - 1.24 mg/dL 7.82  9.56  2.13   Sodium 135 - 145 mmol/L 141  139  137   Potassium 3.5 - 5.1 mmol/L 3.9  3.8  4.7   Chloride 98 - 111 mmol/L 111  111  109   CO2 22 - 32 mmol/L 23  20  20    Calcium 8.9 - 10.3 mg/dL 9.6  9.3  8.7   Total Protein 6.5 - 8.1 g/dL 7.7  7.0    Total Bilirubin 0.3 - 1.2 mg/dL  3.0  2.2    Alkaline Phos 38 - 126 U/L 94  95    AST 15 - 41 U/L 17  16    ALT 0 - 44 U/L 13  11        RADIOGRAPHIC STUDIES: I have personally reviewed the radiological images as listed and agreed with the findings in the report. No results found.    No orders of the defined types were placed in this encounter.  All questions were answered. The patient knows to call the clinic with any problems, questions or concerns. No barriers to learning was detected. The total time spent in the appointment was 30 minutes.     Malachy Mood, MD 07/26/2023   Carolin Coy, CMA, am acting as scribe for Malachy Mood, MD.   I have reviewed the above documentation for accuracy and completeness, and I agree with the above.

## 2023-07-27 ENCOUNTER — Telehealth: Payer: Self-pay

## 2023-07-27 ENCOUNTER — Other Ambulatory Visit: Payer: Self-pay

## 2023-07-27 ENCOUNTER — Encounter: Payer: Self-pay | Admitting: Hematology

## 2023-07-27 NOTE — Telephone Encounter (Signed)
Pt and spouse called to get the results of the pt's CXR done on 07/26/2023.  Informed pt and spouse that Dr. Mosetta Putt is out of the office today but this nurse will make her aware of their call.  Pt's spouse wanted to know what did the CXR say.  Informed pt's spouse that this nurse could read to them what the radiology stated in their reading of the CXR but could not interpret any of the information.  Pt's and spouse verbalized understanding and requested if this nurse could read to them what the radiologist stated.  Stated the radiologist stated "No active cardiopulmonary disease."  Both pt and spouse was glad to hear the results but also requested to speak with Dr. Mosetta Putt when she returns to the office regarding the results.  Stated this nurse will make Dr. Mosetta Putt aware of their call.

## 2023-07-28 LAB — T4: T4, Total: 6.2 ug/dL (ref 4.5–12.0)

## 2023-07-30 ENCOUNTER — Telehealth: Payer: Self-pay

## 2023-07-30 ENCOUNTER — Other Ambulatory Visit: Payer: Self-pay

## 2023-07-30 NOTE — Telephone Encounter (Signed)
LVM stating that Dr. Mosetta Putt ordered a CT scan of the pt's chest that she would like for the pt to have completed before the end of this week.  Instructed pt to contact Central Scheduling at [phone] option#3 to schedule the chest CT Scan.  Stated someone from Dr. Latanya Maudlin scheduling team will contact him to get him scheduled next week to go over the results of the Chest CT.  Instructed pt to contact Dr. Latanya Maudlin office should he have additional questions or concerns.

## 2023-07-31 ENCOUNTER — Ambulatory Visit (HOSPITAL_BASED_OUTPATIENT_CLINIC_OR_DEPARTMENT_OTHER)
Admission: RE | Admit: 2023-07-31 | Discharge: 2023-07-31 | Disposition: A | Discharge status: Home / Self Care | Payer: Medicare PPO | Source: Ambulatory Visit | Attending: Hematology | Admitting: Hematology

## 2023-07-31 ENCOUNTER — Telehealth: Payer: Self-pay | Admitting: Hematology

## 2023-07-31 DIAGNOSIS — R0600 Dyspnea, unspecified: Secondary | ICD-10-CM | POA: Insufficient documentation

## 2023-07-31 DIAGNOSIS — C679 Malignant neoplasm of bladder, unspecified: Secondary | ICD-10-CM | POA: Diagnosis not present

## 2023-07-31 DIAGNOSIS — R918 Other nonspecific abnormal finding of lung field: Secondary | ICD-10-CM | POA: Diagnosis not present

## 2023-07-31 DIAGNOSIS — R0602 Shortness of breath: Secondary | ICD-10-CM | POA: Diagnosis not present

## 2023-08-03 ENCOUNTER — Inpatient Hospital Stay: Payer: Medicare PPO

## 2023-08-03 VITALS — BP 154/86 | HR 68 | Temp 97.6°F | Resp 16 | Wt 216.6 lb

## 2023-08-03 DIAGNOSIS — C679 Malignant neoplasm of bladder, unspecified: Secondary | ICD-10-CM

## 2023-08-03 DIAGNOSIS — Z7901 Long term (current) use of anticoagulants: Secondary | ICD-10-CM | POA: Diagnosis not present

## 2023-08-03 DIAGNOSIS — R5383 Other fatigue: Secondary | ICD-10-CM | POA: Diagnosis not present

## 2023-08-03 DIAGNOSIS — R059 Cough, unspecified: Secondary | ICD-10-CM | POA: Diagnosis not present

## 2023-08-03 DIAGNOSIS — C680 Malignant neoplasm of urethra: Secondary | ICD-10-CM | POA: Diagnosis not present

## 2023-08-03 DIAGNOSIS — R0609 Other forms of dyspnea: Secondary | ICD-10-CM | POA: Diagnosis not present

## 2023-08-03 DIAGNOSIS — Z79899 Other long term (current) drug therapy: Secondary | ICD-10-CM | POA: Diagnosis not present

## 2023-08-03 DIAGNOSIS — Z5112 Encounter for antineoplastic immunotherapy: Secondary | ICD-10-CM | POA: Diagnosis not present

## 2023-08-03 LAB — CBC WITH DIFFERENTIAL (CANCER CENTER ONLY)
Abs Immature Granulocytes: 0.03 10*3/uL (ref 0.00–0.07)
Basophils Absolute: 0.1 10*3/uL (ref 0.0–0.1)
Basophils Relative: 1 %
Eosinophils Absolute: 0.2 10*3/uL (ref 0.0–0.5)
Eosinophils Relative: 3 %
HCT: 43.3 % (ref 39.0–52.0)
Hemoglobin: 14.3 g/dL (ref 13.0–17.0)
Immature Granulocytes: 0 %
Lymphocytes Relative: 14 %
Lymphs Abs: 1.1 10*3/uL (ref 0.7–4.0)
MCH: 29.1 pg (ref 26.0–34.0)
MCHC: 33 g/dL (ref 30.0–36.0)
MCV: 88 fL (ref 80.0–100.0)
Monocytes Absolute: 0.9 10*3/uL (ref 0.1–1.0)
Monocytes Relative: 12 %
Neutro Abs: 5.6 10*3/uL (ref 1.7–7.7)
Neutrophils Relative %: 70 %
Platelet Count: 143 10*3/uL — ABNORMAL LOW (ref 150–400)
RBC: 4.92 MIL/uL (ref 4.22–5.81)
RDW: 16.4 % — ABNORMAL HIGH (ref 11.5–15.5)
WBC Count: 8 10*3/uL (ref 4.0–10.5)
nRBC: 0 % (ref 0.0–0.2)

## 2023-08-03 LAB — CMP (CANCER CENTER ONLY)
ALT: 14 U/L (ref 0–44)
AST: 16 U/L (ref 15–41)
Albumin: 3.9 g/dL (ref 3.5–5.0)
Alkaline Phosphatase: 76 U/L (ref 38–126)
Anion gap: 6 (ref 5–15)
BUN: 31 mg/dL — ABNORMAL HIGH (ref 8–23)
CO2: 21 mmol/L — ABNORMAL LOW (ref 22–32)
Calcium: 9.3 mg/dL (ref 8.9–10.3)
Chloride: 113 mmol/L — ABNORMAL HIGH (ref 98–111)
Creatinine: 2.21 mg/dL — ABNORMAL HIGH (ref 0.61–1.24)
GFR, Estimated: 30 mL/min — ABNORMAL LOW (ref >60)
Glucose, Bld: 143 mg/dL — ABNORMAL HIGH (ref 70–99)
Potassium: 4.2 mmol/L (ref 3.5–5.1)
Sodium: 140 mmol/L (ref 135–145)
Total Bilirubin: 3.2 mg/dL — ABNORMAL HIGH (ref 0.3–1.2)
Total Protein: 7.2 g/dL (ref 6.5–8.1)

## 2023-08-03 LAB — TSH: TSH: 4.858 u[IU]/mL — ABNORMAL HIGH (ref 0.350–4.500)

## 2023-08-03 MED ORDER — SODIUM CHLORIDE 0.9 % IV SOLN: Freq: Once | INTRAVENOUS | Status: AC

## 2023-08-03 MED ORDER — SODIUM CHLORIDE 0.9 % IV SOLN
200.0000 mg | Freq: Once | INTRAVENOUS | Status: AC
  Administered 2023-08-03: 200 mg via INTRAVENOUS
  Filled 2023-08-03 (×2): qty 8

## 2023-08-03 NOTE — Progress Notes (Signed)
Ok to treat with bili 3.2 mg/dL per Dr Bertis Ruddy

## 2023-08-04 LAB — T4: T4, Total: 5.5 ug/dL (ref 4.5–12.0)

## 2023-08-06 ENCOUNTER — Inpatient Hospital Stay: Payer: Medicare PPO | Admitting: Hematology

## 2023-08-06 ENCOUNTER — Ambulatory Visit: Payer: Medicare PPO | Admitting: Family Medicine

## 2023-08-06 ENCOUNTER — Other Ambulatory Visit: Payer: Self-pay | Admitting: Hematology

## 2023-08-06 ENCOUNTER — Ambulatory Visit (INDEPENDENT_AMBULATORY_CARE_PROVIDER_SITE_OTHER): Payer: Medicare PPO

## 2023-08-06 ENCOUNTER — Encounter: Payer: Self-pay | Admitting: Family Medicine

## 2023-08-06 ENCOUNTER — Telehealth: Payer: Self-pay | Admitting: Hematology

## 2023-08-06 VITALS — BP 118/76 | HR 50 | Temp 97.3°F | Ht 70.0 in | Wt 217.0 lb

## 2023-08-06 VITALS — BP 118/76 | HR 50 | Temp 97.3°F | Ht 70.0 in | Wt 217.8 lb

## 2023-08-06 DIAGNOSIS — C689 Malignant neoplasm of urinary organ, unspecified: Secondary | ICD-10-CM | POA: Diagnosis not present

## 2023-08-06 DIAGNOSIS — N1832 Chronic kidney disease, stage 3b: Secondary | ICD-10-CM

## 2023-08-06 DIAGNOSIS — Z0000 Encounter for general adult medical examination without abnormal findings: Secondary | ICD-10-CM | POA: Diagnosis not present

## 2023-08-06 DIAGNOSIS — E1159 Type 2 diabetes mellitus with other circulatory complications: Secondary | ICD-10-CM | POA: Diagnosis not present

## 2023-08-06 DIAGNOSIS — Z23 Encounter for immunization: Secondary | ICD-10-CM | POA: Diagnosis not present

## 2023-08-06 DIAGNOSIS — I10 Essential (primary) hypertension: Secondary | ICD-10-CM | POA: Diagnosis not present

## 2023-08-06 DIAGNOSIS — I25119 Atherosclerotic heart disease of native coronary artery with unspecified angina pectoris: Secondary | ICD-10-CM

## 2023-08-06 DIAGNOSIS — I4891 Unspecified atrial fibrillation: Secondary | ICD-10-CM | POA: Diagnosis not present

## 2023-08-06 DIAGNOSIS — Z7984 Long term (current) use of oral hypoglycemic drugs: Secondary | ICD-10-CM

## 2023-08-06 DIAGNOSIS — I5032 Chronic diastolic (congestive) heart failure: Secondary | ICD-10-CM | POA: Diagnosis not present

## 2023-08-06 LAB — GLUCOSE, RANDOM: Glucose, Bld: 122 mg/dL — ABNORMAL HIGH (ref 70–99)

## 2023-08-06 LAB — PHOSPHORUS: Phosphorus: 4.2 mg/dL (ref 2.3–4.6)

## 2023-08-06 LAB — HEMOGLOBIN A1C: Hgb A1c MFr Bld: 6.7 % — ABNORMAL HIGH (ref 4.6–6.5)

## 2023-08-06 NOTE — Assessment & Plan Note (Signed)
Compensated. Continue metoprolol tartrate 25 mg twice daily, olmesartan 40 mg daily, dapagliflozin 10 mg daily and PRN furosemide.

## 2023-08-06 NOTE — Assessment & Plan Note (Signed)
Rate controlled currently. Continue metoprolol tartrate 25 mg bid.

## 2023-08-06 NOTE — Assessment & Plan Note (Signed)
Stable. Continue focus on blood pressure control and use of dapagliflozin 10 mg daily.

## 2023-08-06 NOTE — Progress Notes (Signed)
[facility]IMARY [facility]ARE [facility] [address] [place] [address] Dept: [phone] Dept Fax: [phone]  Chronic Care Office Visit  Subjective:    Patient ID: Adam Harris, male    DOB: 07-15-1946, 77 y.o..   MRN: [id number]  Chief Complaint  Patient presents with   Hypertension    3 month f/u. C/o having knee pain.    History of Present Illness:  Patient is in today for reassessment of chronic medical issues.  Adam Harris has a history of atrial fibrillation. He is on metoprolol tartrate 25 mg twice daily for rate control. He is no longer taking anticoagulation due to urinary tract bleeding issues.   Adam Harris has a history of hypertension. He is managed on on metoprolol tartrate 25 mg twice daily, amlodipine 5 mg daily, olmesartan 40 mg daily.   Adam Harris has a history of coronary artery disease. He is managed on metoprolol tartrate 25 mg twice daily and isosorbide mononitrate 30 mg daily. He is not on aspirin due to hematuria issues.   Adam Harris has a history of chronic diastolic heart failure. He is managed on metoprolol tartrate 25 mg twice daily, olmesartan 40 mg daily, dapagliflozin 10 mg daily and PRN furosemide.   Adam Harris has a history of hyperlipidemia and is managed on rosuvastatin 40 mg daily.   Adam Harris has a history of Type 2 diabetes. He is managed on sitagliptin (Januvia) 25 mg daily and dapagliflozin 10 mg daily.   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 He has had a right nephroureterectomy. He continues to have issues with hematuria. Recently, he was started on every 2 week infusions of Keytruda.  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   Chronic diastolic CHF (congestive heart failure), NYHA class 2 (HCC) 04/02/2017   Atrial fibrillation with RVR (HCC) 11/20/2016   OA (osteoarthritis) of knee 05/31/2016   Low back pain 05/31/2016   Exertional angina 10/29/2014   Chronic kidney disease, stage 3b (HCC) 10/29/2014   Coronary artery disease involving native coronary artery of native heart with angina pectoris (HCC) 03/09/2014   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/2014   Hyperlipidemia 02/05/2014   History of non-ST elevation myocardial infarction (NSTEMI) 02/03/2014   Essential hypertension 02/03/2014   Anxiety state 04/19/2013   History of prostate cancer 04/19/2013   Insomnia 04/19/2013   Stiffness of joint, lower leg 04/19/2013   Sciatica 04/19/2013   Past Surgical History:  Procedure Laterality Date   CARDIAC CATHETERIZATION N/A 11/21/2016   Procedure: Left Heart Cath and Coronary Angiography;  Surgeon: Lennette Bihari, MD;  Location: [facility];  Service: Cardiovascular;  Laterality: N/A;  pRCA 100% , ostial LAD 45%, OM3 80%, mCFX to dCFX 100% (AV groove), lateral OM3 50%   COLONOSCOPY     CYSTOSCOPY WITH BIOPSY N/A 08/12/2020   Procedure: CYSTOSCOPY WITH BLADDER BIOPSY AND TRANSURETHRAL RESECTION OF  BLADDER TUMOR;  Surgeon: Heloise Purpura, MD;  Location: [facility];  Service: Urology;  Laterality: N/A;   CYSTOSCOPY WITH BIOPSY N/A 11/10/2021   Procedure: CYSTOSCOPY WITH BLADDER BIOPSIES/ LEFT RETROGRADE;  Surgeon: Heloise Purpura, MD;  Location: [facility];  Service: Urology;  Laterality: N/A;   CYSTOSCOPY WITH BIOPSY Left 07/27/2022   Procedure: CYSTOSCOPY WITH  BLADDER  BIOPSY, TRANSURETHRAL FULGERATION OF BLADDER, EXAM UNDER ANESTHESIA, LEFT RETROGRADE PYELOGRAM;  Surgeon: Heloise Purpura, MD;  Location: [facility];  Service: Urology;  Laterality: Left;   CYSTOSCOPY WITH BIOPSY N/A 04/23/2023   Procedure: CYSTOSCOPY WITH BLADDER AND PROSTATIC URETHRAL BIOPSIES;  Surgeon: Heloise Purpura, MD;  Location: [facility];  Service: Urology;  Laterality: N/A;  60 MINUTES NEEDED FOR CASE   CYSTOSCOPY WITH URETEROSCOPY AND STENT PLACEMENT Right 12/15/2019   Procedure: CYSTOSCOPY WITH RIGHT RETROGRADE/ RIGHT URETEROSCOPY/ BIOPSY;  Surgeon: Ihor Gully, MD;  Location: [facility];  Service: Urology;  Laterality: Right;   CYSTOSCOPY/RETROGRADE/URETEROSCOPY Bilateral 03/18/2018   Procedure: CYSTOSCOPY/RETROGRADE/URETEROSCOPY.;  Surgeon: Ihor Gully, MD;  Location: [facility];  Service: Urology;  Laterality: Bilateral;   EYE SURGERY Bilateral    cateract in January 2023   HYDROCELE EXCISION Left 07/27/2022   Procedure: HYDROCELE REPAIR;  Surgeon: Heloise Purpura, MD;  Location: [facility];  Service: Urology;  Laterality: Left;  GENERAL ANESTHESIA WITH PARALYSIS   KNEE ARTHROSCOPY Right    LEFT HEART CATHETERIZATION WITH CORONARY ANGIOGRAM N/A 02/04/2014   Procedure: LEFT HEART CATHETERIZATION WITH CORONARY ANGIOGRAM;  Surgeon: Iran Ouch, MD;  Location: [facility];  Service: Cardiovascular;  Laterality: N/A;  severe one-vessel CAD, chronically occluded RCA with faint left-to-right collaterals;  aneurysmal LCFx with sluggish coronary flow;  normal LVSF w/ moderately elevated LVEDP (ostialOM2 20%, pOM3 20%, pD1 20%, mCFX 50%, diffuse 20% pCFX)   PROSTATE BIOPSY N/A 08/12/2020   Procedure: BIOPSY TRANSRECTAL ULTRASONIC PROSTATE (TUBP);  Surgeon: Heloise Purpura, MD;  Location: [facility];  Service: Urology;  Laterality: N/A;   ROBOT ASSITED LAPAROSCOPIC NEPHROURETERECTOMY Right 03/22/2020   Procedure: XI ROBOT ASSITED LAPAROSCOPIC NEPHROURETERECTOMY;  Surgeon:  Heloise Purpura, MD;  Location: [facility];  Service: Urology;  Laterality: Right;   TRANSTHORACIC ECHOCARDIOGRAM  11-22-2016   dr hochrein   ef 55-60%/ mild MR and TR/ moderate LAE   TRANSURETHRAL RESECTION OF BLADDER TUMOR N/A 02/10/2021   Procedure: TRANSURETHRAL RESECTION OF BLADDER TUMOR (TURBT)/ CYSTOSCOPY/ LEFT RETROGRADE;  Surgeon: Heloise Purpura, MD;  Location: [facility];  Service: Urology;  Laterality: N/A;  GENERAL ANESTHESIA WITH PARALYSIS   Family History  Problem Relation Age of Onset   Hypertension Mother    Cancer Mother        anal cancer   Esophageal cancer Neg Hx    Pancreatic cancer Neg Hx    Stomach cancer Neg Hx    Liver disease Neg Hx    Outpatient Medications Prior to Visit  Medication Sig Dispense Refill   amLODipine (NORVASC) 5 MG tablet Take 1 tablet (5 mg total) by mouth daily. 90 tablet 3   FARXIGA 10 MG TABS tablet Take 10 mg by mouth daily.     finasteride (PROSCAR) 5 MG tablet Take 5 mg by mouth daily.     furosemide (LASIX) 20 MG tablet Take 1 tablet (20 mg total) by mouth daily. 90 tablet 3   hydrOXYzine (VISTARIL) 25 MG capsule Take 1 capsule (25 mg total) by mouth every 8 (eight) hours as needed. 30 capsule 2   isosorbide mononitrate (IMDUR) 30 MG  24 hr tablet Take 1 tablet (30 mg total) by mouth daily. 90 tablet 1   metoprolol tartrate (LOPRESSOR) 25 MG tablet TAKE ONE TABLET BY MOUTH TWICE A DAY 180 tablet 2   nitroGLYCERIN (NITROSTAT) 0.4 MG SL tablet TAKE ONE TABLET UNDER THE TONGUE EVERY FIVE MINUTES FOR THREE DOSES AS NEEDED FOR CHEST PAIN, CALL 911 IF 2ND DOSE DOESN'T HELP 25 tablet 4   olmesartan (BENICAR) 40 MG tablet Take 40 mg by mouth daily.     oxybutynin (DITROPAN-XL) 5 MG 24 hr tablet Take 1 tablet (5 mg total) by mouth at bedtime. 30 tablet 0   rosuvastatin (CRESTOR) 40 MG tablet Take 1 tablet (40 mg total) by mouth every morning. Please keep scheduled appointment 90 tablet 3   sitaGLIPtin (JANUVIA) 25 MG tablet TAKE ONE TABLET BY MOUTH DAILY  60 tablet 0   sodium bicarbonate 650 MG tablet Take 650 mg by mouth 2 (two) times daily.     tamsulosin (FLOMAX) 0.4 MG CAPS capsule Take 1 capsule (0.4 mg total) by mouth daily. 30 capsule 3   mupirocin cream (BACTROBAN) 2 % Apply 1 Application topically 2 (two) times daily. (Patient not taking: Reported on 07/05/2023) 15 g 0   cephALEXin (KEFLEX) 500 MG capsule Take 1 capsule (500 mg total) by mouth 3 (three) times daily. 21 capsule 0   No facility-administered medications prior to visit.   Allergies  Allergen Reactions   Xarelto [Rivaroxaban] Other (See Comments)    Skin Blisters    Objective:   Today's Vitals   08/06/23 1258  BP: 118/76  Pulse: (!) 50  Temp: (!) 97.3 F (36.3 C)  TempSrc: Temporal  SpO2: 99%  Weight: 217 lb 12.8 oz (98.8 kg)  Height: 5\' 10"  (1.778 m)   Body mass index is 31.25 kg/m.   General: Well developed, well nourished. No acute distress. Extremities: No edema noted. Psych: Alert and oriented. Normal mood and affect.  Health Maintenance Due  Topic Date Due   Zoster Vaccines- Shingrix (1 of 2) Never done   OPHTHALMOLOGY EXAM  05/18/2022   INFLUENZA VACCINE  06/14/2023   Medicare Annual Wellness (AWV)  06/14/2023   Lab Results    Latest Ref Rng & Units 08/03/2023    2:19 PM 07/26/2023    1:45 PM 07/05/2023   12:48 PM  CBC  WBC 4.0 - 10.5 K/uL 8.0  8.9  8.9   Hemoglobin 13.0 - 17.0 g/dL 46.9  62.9  52.8   Hematocrit 39.0 - 52.0 % 43.3  44.4  49.1   Platelets 150 - 400 K/uL 143  135  134        Latest Ref Rng & Units 08/03/2023    2:19 PM 07/26/2023    1:45 PM 07/05/2023   12:48 PM  CMP  Glucose 70 - 99 mg/dL 413  244  010   BUN 8 - 23 mg/dL 31  33  32   Creatinine 0.61 - 1.24 mg/dL 2.72  5.36  6.44   Sodium 135 - 145 mmol/L 140  140  141   Potassium 3.5 - 5.1 mmol/L 4.2  4.2  3.9   Chloride 98 - 111 mmol/L 113  111  111   CO2 22 - 32 mmol/L 21  22  23    Calcium 8.9 - 10.3 mg/dL 9.3  9.6  9.6   Total Protein 6.5 - 8.1 g/dL 7.2  7.1   7.7   Total Bilirubin 0.3 - 1.2 mg/dL 3.2  3.3  3.0   Alkaline Phos 38 - 126 U/L 76  78  94   AST 15 - 41 U/L 16  28  17    ALT 0 - 44 U/L 14  29  13     Assessment & Plan:   Problem List Items Addressed This Visit       Cardiovascular and Mediastinum   Atrial fibrillation with RVR (HCC) - Primary    Rate controlled currently. Continue metoprolol tartrate 25 mg bid.      Chronic diastolic CHF (congestive heart failure), NYHA class 2 (HCC)    Compensated. Continue metoprolol tartrate 25 mg twice daily, olmesartan 40 mg daily, dapagliflozin 10 mg daily and PRN furosemide.      Coronary artery disease involving native coronary artery of native heart with angina pectoris (HCC)    No chest pain. Continue metoprolol tartrate 25 mg twice daily and isosorbide mononitrate 30 mg daily.      Essential hypertension    Blood pressure is in good control. Continue metoprolol tartrate 25 mg twice daily, amlodipine 5 mg daily, olmesartan 40 mg daily.      Type 2 diabetes mellitus with cardiac complication (HCC)    I will check A1c today. Continue sitagliptin (Januvia) 25 mg daily and dapagliflozin 10 mg daily.      Relevant Orders   Glucose, random   Hemoglobin A1c     Genitourinary   Chronic kidney disease, stage 3b (HCC)    Stable. Continue focus on blood pressure control and use of dapagliflozin 10 mg daily.      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      Relevant Medications   Pembrolizumab (KEYTRUDA IV)   Other Visit Diagnoses     Need for immunization against influenza       Relevant Orders   Flu Vaccine Trivalent High Dose (Fluad) (Completed)       Return in about 3 months (around 11/05/2023) for Reassessment.   Loyola Mast, MD

## 2023-08-06 NOTE — Patient Instructions (Signed)
Adam Harris , Thank you for taking time to come for your Medicare Wellness Visit. I appreciate your ongoing commitment to your health goals. Please review the following plan we discussed and let me know if I can assist you in the future.   Referrals/Orders/Follow-Ups/Clinician Recommendations: none  This is a list of the screening recommended for you and due dates:  Health Maintenance  Topic Date Due   Zoster (Shingles) Vaccine (1 of 2) Never done   Eye exam for diabetics  05/18/2022   COVID-19 Vaccine (1) 08/22/2023*   Hemoglobin A1C  10/13/2023   Yearly kidney health urinalysis for diabetes  05/03/2024   Complete foot exam   05/03/2024   Yearly kidney function blood test for diabetes  08/02/2024   Medicare Annual Wellness Visit  08/05/2024   DTaP/Tdap/Td vaccine (5 - Td or Tdap) 07/07/2028   Pneumonia Vaccine  Completed   Flu Shot  Completed   Hepatitis C Screening  Completed   HPV Vaccine  Aged Out   Colon Cancer Screening  Discontinued  *Topic was postponed. The date shown is not the original due date.    Advanced directives: (Declined) Advance directive discussed with you today. Even though you declined this today, please call our office should you change your mind, and we can give you the proper paperwork for you to fill out.  Next Medicare Annual Wellness Visit scheduled for next year: Yes  insert Preventive Care attachment Insert FALL PREVENTION attachment if needed

## 2023-08-06 NOTE — Assessment & Plan Note (Signed)
No chest pain. Continue metoprolol tartrate 25 mg twice daily and isosorbide mononitrate 30 mg daily.

## 2023-08-06 NOTE — Assessment & Plan Note (Signed)
Blood pressure is in good control. Continue metoprolol tartrate 25 mg twice daily, amlodipine 5 mg daily, olmesartan 40 mg daily.

## 2023-08-06 NOTE — Assessment & Plan Note (Signed)
Currently on Keytruda.

## 2023-08-06 NOTE — Progress Notes (Signed)
Subjective:   Jonathn Santoli is a 77 y.o. male who presents for Medicare Annual/Subsequent preventive examination.  Visit Complete: In person    Cardiac Risk Factors include: advanced age (>56men, >63 women);diabetes mellitus;dyslipidemia;hypertension;male gender     Objective:    Today's Vitals   08/06/23 1342  BP: 118/76  Pulse: (!) 50  Temp: (!) 97.3 F (36.3 C)  TempSrc: Oral  SpO2: 99%  Weight: 217 lb (98.4 kg)  Height: 5\' 10"  (1.778 m)   Body mass index is 31.14 kg/m.     08/06/2023    1:49 PM 04/23/2023    1:25 PM 04/13/2023   11:11 AM 07/27/2022   10:27 AM 07/25/2022    8:10 AM 06/13/2022    9:30 AM 02/09/2022   12:30 PM  Advanced Directives  Does Patient Have a Medical Advance Directive? No No No No No No No  Would patient like information on creating a medical advance directive? No - Patient declined No - Patient declined Yes (MAU/Ambulatory/Procedural Areas - Information given) No - Patient declined No - Patient declined No - Patient declined     Current Medications (verified) Outpatient Encounter Medications as of 08/06/2023  Medication Sig   amLODipine (NORVASC) 5 MG tablet Take 1 tablet (5 mg total) by mouth daily.   FARXIGA 10 MG TABS tablet Take 10 mg by mouth daily.   finasteride (PROSCAR) 5 MG tablet Take 5 mg by mouth daily.   furosemide (LASIX) 20 MG tablet Take 1 tablet (20 mg total) by mouth daily.   hydrOXYzine (VISTARIL) 25 MG capsule Take 1 capsule (25 mg total) by mouth every 8 (eight) hours as needed.   isosorbide mononitrate (IMDUR) 30 MG 24 hr tablet Take 1 tablet (30 mg total) by mouth daily.   metoprolol tartrate (LOPRESSOR) 25 MG tablet TAKE ONE TABLET BY MOUTH TWICE A DAY   nitroGLYCERIN (NITROSTAT) 0.4 MG SL tablet TAKE ONE TABLET UNDER THE TONGUE EVERY FIVE MINUTES FOR THREE DOSES AS NEEDED FOR CHEST PAIN, CALL 911 IF 2ND DOSE DOESN'T HELP   olmesartan (BENICAR) 40 MG tablet Take 40 mg by mouth daily.   oxybutynin (DITROPAN-XL) 5 MG 24 hr  tablet Take 1 tablet (5 mg total) by mouth at bedtime.   rosuvastatin (CRESTOR) 40 MG tablet Take 1 tablet (40 mg total) by mouth every morning. Please keep scheduled appointment   sitaGLIPtin (JANUVIA) 25 MG tablet TAKE ONE TABLET BY MOUTH DAILY   sodium bicarbonate 650 MG tablet Take 650 mg by mouth 2 (two) times daily.   tamsulosin (FLOMAX) 0.4 MG CAPS capsule Take 1 capsule (0.4 mg total) by mouth daily.   mupirocin cream (BACTROBAN) 2 % Apply 1 Application topically 2 (two) times daily. (Patient not taking: Reported on 07/05/2023)   No facility-administered encounter medications on file as of 08/06/2023.    Allergies (verified) Xarelto [rivaroxaban]   History: Past Medical History:  Diagnosis Date   Abnormal radiologic findings on diagnostic imaging of renal pelvis, ureter, or bladder    bilateral ureter abnormalities   Anticoagulant long-term use    eliquis   Anxiety    Arthritis    CAD (coronary artery disease) cardiologist-- dr hochrein   NSTEMI 02-04-2014  per cardiac cath chronic occluded RCA w/ faint left-to-right collaterals and aneurysmal LCFx with sluggish coronary flow/   NSTEMI --11-21-2016 per cardiac cath occluded proximal RCA & mid to diastal CFX 100%, med rx. If that does not work, PTCA or CABG   Cancer (HCC)  bladder and kidney   CKD (chronic kidney disease), stage III (HCC)    patient unaware   DOE (dyspnea on exertion)    Fatty liver    pt denies   Hematuria 02/2019   History of COVID-19 10/2019   History of non-ST elevation myocardial infarction (NSTEMI)    02-04-2014  and 11-21-2016  cardiac cath done both times ,  medically management   History of shingles 12/2017   slight pain and numbness still noted in the area   Hyperlipidemia    Hypertension    Insomnia    Myocardial infarction [facility]) 2015   Persistent atrial fibrillation [facility])    cardiologsit-- dr hochrein   Thoracic aortic atherosclerosis (HCC)    Type 2 diabetes mellitus (HCC)    Urinary  frequency    Past Surgical History:  Procedure Laterality Date   CARDIAC CATHETERIZATION N/A 11/21/2016   Procedure: Left Heart Cath and Coronary Angiography;  Surgeon: Lennette Bihari, MD;  Location: [facility];  Service: Cardiovascular;  Laterality: N/A;  pRCA 100% , ostial LAD 45%, OM3 80%, mCFX to dCFX 100% (AV groove), lateral OM3 50%   COLONOSCOPY     CYSTOSCOPY WITH BIOPSY N/A 08/12/2020   Procedure: CYSTOSCOPY WITH BLADDER BIOPSY AND TRANSURETHRAL RESECTION OF BLADDER TUMOR;  Surgeon: Heloise Purpura, MD;  Location: [facility];  Service: Urology;  Laterality: N/A;   CYSTOSCOPY WITH BIOPSY N/A 11/10/2021   Procedure: CYSTOSCOPY WITH BLADDER BIOPSIES/ LEFT RETROGRADE;  Surgeon: Heloise Purpura, MD;  Location: [facility];  Service: Urology;  Laterality: N/A;   CYSTOSCOPY WITH BIOPSY Left 07/27/2022   Procedure: CYSTOSCOPY WITH  BLADDER BIOPSY, TRANSURETHRAL FULGERATION OF BLADDER, EXAM UNDER ANESTHESIA, LEFT RETROGRADE PYELOGRAM;  Surgeon: Heloise Purpura, MD;  Location: [facility];  Service: Urology;  Laterality: Left;   CYSTOSCOPY WITH BIOPSY N/A 04/23/2023   Procedure: CYSTOSCOPY WITH BLADDER AND PROSTATIC URETHRAL BIOPSIES;  Surgeon: Heloise Purpura, MD;  Location: [facility];  Service: Urology;  Laterality: N/A;  60 MINUTES NEEDED FOR CASE   CYSTOSCOPY WITH URETEROSCOPY AND STENT PLACEMENT Right 12/15/2019   Procedure: CYSTOSCOPY WITH RIGHT RETROGRADE/ RIGHT URETEROSCOPY/ BIOPSY;  Surgeon: Ihor Gully, MD;  Location[facility];  Service: Urology;  Laterality: Right;   CYSTOSCOPY/RETROGRADE/URETEROSCOPY Bilateral 03/18/2018   Procedure: CYSTOSCOPY/RETROGRADE/URETEROSCOPY.;  Surgeon: Ihor Gully, MD;  Location: [facility];  Service: Urology;  Laterality: Bilateral;   EYE SURGERY Bilateral    cateract in January 2023   HYDROCELE EXCISION Left 07/27/2022   Procedure: HYDROCELE REPAIR;  Surgeon: Heloise Purpura, MD;  Location: [facility];  Service: Urology;  Laterality:  Left;  GENERAL ANESTHESIA WITH PARALYSIS   KNEE ARTHROSCOPY Right    LEFT HEART CATHETERIZATION WITH CORONARY ANGIOGRAM N/A 02/04/2014   Procedure: LEFT HEART CATHETERIZATION WITH CORONARY ANGIOGRAM;  Surgeon: Iran Ouch, MD;  Location: [facility];  Service: Cardiovascular;  Laterality: N/A;  severe one-vessel CAD, chronically occluded RCA with faint left-to-right collaterals;  aneurysmal LCFx with sluggish coronary flow;  normal LVSF w/ moderately elevated LVEDP (ostialOM2 20%, pOM3 20%, pD1 20%, mCFX 50%, diffuse 20% pCFX)   PROSTATE BIOPSY N/A 08/12/2020   Procedure: BIOPSY TRANSRECTAL ULTRASONIC PROSTATE (TUBP);  Surgeon: Heloise Purpura, MD;  Location: [facility];  Service: Urology;  Laterality: N/A;   ROBOT ASSITED LAPAROSCOPIC NEPHROURETERECTOMY Right 03/22/2020   Procedure: XI ROBOT ASSITED LAPAROSCOPIC NEPHROURETERECTOMY;  Surgeon: Heloise Purpura, MD;  Location: [facility];  Service: Urology;  Laterality: Right;   TRANSTHORACIC ECHOCARDIOGRAM  11-22-2016   dr  hochrein   ef 55-60%/ mild MR and TR/ moderate LAE   TRANSURETHRAL RESECTION OF BLADDER TUMOR N/A 02/10/2021   Procedure: TRANSURETHRAL RESECTION OF BLADDER TUMOR (TURBT)/ CYSTOSCOPY/ LEFT RETROGRADE;  Surgeon: Heloise Purpura, MD;  Location[facility];  Service: Urology;  Laterality: N/A;  GENERAL ANESTHESIA WITH PARALYSIS   Family History  Problem Relation Age of Onset   Hypertension Mother    Cancer Mother        anal cancer   Esophageal cancer Neg Hx    Pancreatic cancer Neg Hx    Stomach cancer Neg Hx    Liver disease Neg Hx    Social History   Socioeconomic History   Marital status: Married    Spouse name: Darl Pikes   Number of children: 3   Years of education: Not on file   Highest education level: Master's degree (e.g., MA, MS, MEng, MEd, MSW, MBA)  Occupational History   Occupation: Retired- Psychologist, occupational    Comment: [employer]  Tobacco Use   Smoking status: Former    Current packs/day: 0.00    Average packs/day: 0.3  packs/day for 4.0 years (1.0 ttl pk-yrs)    Types: Cigarettes    Start date: 1968    Quit date: 1972    Years since quitting: 52.7   Smokeless tobacco: Former    Types: Chew    Quit date: 1972  Vaping Use   Vaping status: Never Used  Substance and Sexual Activity   Alcohol use: No   Drug use: No   Sexual activity: Yes  Other Topics Concern   Not on file  Social History Narrative   Not on file   Social Determinants of Health   Financial Resource Strain: Low Risk  (08/06/2023)   Overall Financial Resource Strain (CARDIA)    Difficulty of Paying Living Expenses: Not hard at all  Food Insecurity: No Food Insecurity (08/06/2023)   Hunger Vital Sign    Worried About Running Out of Food in the Last Year: Never true    Ran Out of Food in the Last Year: Never true  Transportation Needs: No Transportation Needs (08/06/2023)   PRAPARE - Administrator, Civil Service (Medical): No    Lack of Transportation (Non-Medical): No  Physical Activity: Inactive (08/06/2023)   Exercise Vital Sign    Days of Exercise per Week: 0 days    Minutes of Exercise per Session: 0 min  Stress: No Stress Concern Present (08/06/2023)   [employer] of Occupational Health - Occupational Stress Questionnaire    Feeling of Stress : Not at all  Social Connections: Moderately Integrated (08/06/2023)   Social Connection and Isolation Panel [NHANES]    Frequency of Communication with Friends and Family: More than three times a week    Frequency of Social Gatherings with Friends and Family: More than three times a week    Attends Religious Services: More than 4 times per year    Active Member of [employer] or Organizations: No    Attends Engineer, structural: Never    Marital Status: Married    Tobacco Counseling Counseling given: Not Answered   Clinical Intake:  Pre-visit preparation completed: Yes  Pain : No/denies pain     Nutritional Status: BMI > 30  Obese Nutritional Risks:  None Diabetes: Yes CBG done?: No Did pt. bring in CBG monitor from home?: No  How often do you need to have someone help you when you read instructions, pamphlets, or other written materials  from your doctor or pharmacy?: 1 - Never  Interpreter Needed?: No  Information entered by :: NAllen LPN   Activities of Daily Living    08/06/2023    1:43 PM 04/13/2023   11:10 AM  In your present state of health, do you have any difficulty performing the following activities:  Hearing? 0 0  Vision? 0 0  Difficulty concentrating or making decisions? 0 0  Walking or climbing stairs? 0 0  Dressing or bathing? 0 0  Doing errands, shopping? 0   Preparing Food and eating ? N   Using the Toilet? N   In the past six months, have you accidently leaked urine? Y   Comment trouble with bladder   Do you have problems with loss of bowel control? N   Managing your Medications? N   Managing your Finances? N   Housekeeping or managing your Housekeeping? N     Patient Care Team: Loyola Mast, MD as PCP - General (Family Medicine) Rollene Rotunda, MD as PCP - Cardiology (Cardiology) Heloise Purpura, MD as Consulting Physician (Urology) Rollene Rotunda, MD as Consulting Physician (Cardiology) Estanislado Emms, MD as Consulting Physician (Nephrology) Malachy Mood, MD as Consulting Physician (Hematology and Oncology) [facility]  Indicate any recent Medical Services you may have received from other than [facility] providers in the past year (date may be approximate).     Assessment:   This is a routine wellness examination for Vandy.  Hearing/Vision screen Hearing Screening - Comments:: Denies hearing issues Vision Screening - Comments:: Regular eye exams, [facility]   Goals Addressed             This Visit's Progress    Patient Stated       08/06/2023, get through treatment       Depression Screen    08/06/2023    1:52 PM 08/06/2023    1:03 PM 05/04/2023    1:31 PM  04/06/2023    3:58 PM 06/13/2022    9:31 AM 06/13/2022    9:29 AM 05/31/2021   10:43 AM  PHQ 2/9 Scores  PHQ - 2 Score 0 0 0 0 0 0 0  PHQ- 9 Score 4          Fall Risk    08/06/2023    1:51 PM 08/06/2023    1:03 PM 05/04/2023    1:30 PM 04/06/2023    3:57 PM 06/13/2022    9:31 AM  Fall Risk   Falls in the past year? 0 0 0 0 0  Number falls in past yr: 0 0 0 0 0  Injury with Fall? 0 0 1 0 0  Risk for fall due to : No Fall Risks No Fall Risks History of fall(s) No Fall Risks   Follow up Falls prevention discussed;Falls evaluation completed Falls evaluation completed Falls evaluation completed Falls evaluation completed Falls evaluation completed;Education provided    MEDICARE RISK AT HOME: Medicare Risk at Home Any stairs in or around the home?: Yes If so, are there any without handrails?: No Home free of loose throw rugs in walkways, pet beds, electrical cords, etc?: Yes Adequate lighting in your home to reduce risk of falls?: Yes Life alert?: No Use of a cane, walker or w/c?: No Grab bars in the bathroom?: No Shower chair or bench in shower?: No Elevated toilet seat or a handicapped toilet?: No  TIMED UP AND GO:  Was the test performed?  Yes  Length of time  to ambulate 10 feet: 5 sec Gait slow and steady without use of assistive device    Cognitive Function:        08/06/2023    1:53 PM 06/13/2022    9:34 AM  6CIT Screen  What Year? 0 points 0 points  What month? 0 points 0 points  What time? 0 points 0 points  Count back from 20 0 points 0 points  Months in reverse 0 points 0 points  Repeat phrase 2 points 0 points  Total Score 2 points 0 points    Immunizations Immunization History  Administered Date(s) Administered   DTaP 12/24/2014   Fluad Quad(high Dose 65+) 09/04/2019, 12/17/2020, 09/27/2021   Fluad Trivalent(High Dose 65+) 08/06/2023   Influenza, High Dose Seasonal PF 08/23/2018   Influenza,inj,Quad PF,6+ Mos 07/27/2014   Influenza-Unspecified 07/27/2014    Pneumococcal Conjugate-13 05/22/2016   Pneumococcal Polysaccharide-23 02/03/2014, 12/24/2014   Tdap 02/03/2014, 12/24/2014, 07/07/2018   Typhoid Live 01/27/1966   Vaccinia,smallpox Monkeypox Vaccine Live,pf 01/27/1966    TDAP status: Up to date  Flu Vaccine status: Up to date  Pneumococcal vaccine status: Up to date  Covid-19 vaccine status: Information provided on how to obtain vaccines.   Qualifies for Shingles Vaccine? Yes   Zostavax completed No   Shingrix Completed?: No.    Education has been provided regarding the importance of this vaccine. Patient has been advised to call insurance company to determine out of pocket expense if they have not yet received this vaccine. Advised may also receive vaccine at local pharmacy or Health Dept. Verbalized acceptance and understanding.  Screening Tests Health Maintenance  Topic Date Due   Zoster Vaccines- Shingrix (1 of 2) Never done   OPHTHALMOLOGY EXAM  05/18/2022   COVID-19 Vaccine (1) 08/22/2023 (Originally 11/20/1950)   HEMOGLOBIN A1C  10/13/2023   Diabetic kidney evaluation - Urine ACR  05/03/2024   FOOT EXAM  05/03/2024   Diabetic kidney evaluation - eGFR measurement  08/02/2024   Medicare Annual Wellness (AWV)  08/05/2024   DTaP/Tdap/Td (5 - Td or Tdap) 07/07/2028   Pneumonia Vaccine 26+ Years old  Completed   INFLUENZA VACCINE  Completed   Hepatitis C Screening  Completed   HPV VACCINES  Aged Out   Colonoscopy  Discontinued    Health Maintenance  Health Maintenance Due  Topic Date Due   Zoster Vaccines- Shingrix (1 of 2) Never done   OPHTHALMOLOGY EXAM  05/18/2022    Colorectal cancer screening: No longer required.   Lung Cancer Screening: (Low Dose CT Chest recommended if Age 52-80 years, 20 pack-year currently smoking OR have quit w/in 15years.) does not qualify.   Lung Cancer Screening Referral: no  Additional Screening:  Hepatitis C Screening: does qualify; Completed 05/22/2016  Vision Screening:  Recommended annual ophthalmology exams for early detection of glaucoma and other disorders of the eye. Is the patient up to date with their annual eye exam?  No  Who is the provider or what is the name of the office in which the patient attends annual eye exams? [facility] If pt is not established with a provider, would they like to be referred to a provider to establish care? No .   Dental Screening: Recommended annual dental exams for proper oral hygiene  Diabetic Foot Exam: Diabetic Foot Exam: Completed 05/04/2023  Community Resource Referral / Chronic Care Management: CRR required this visit?  No   CCM required this visit?  No     Plan:  I have personally reviewed and noted the following in the patient's chart:   Medical and social history Use of alcohol, tobacco or illicit drugs  Current medications and supplements including opioid prescriptions. Patient is not currently taking opioid prescriptions. Functional ability and status Nutritional status Physical activity Advanced directives List of other physicians Hospitalizations, surgeries, and ER visits in previous 12 months Vitals Screenings to include cognitive, depression, and falls Referrals and appointments  In addition, I have reviewed and discussed with patient certain preventive protocols, quality metrics, and best practice recommendations. A written personalized care plan for preventive services as well as general preventive health recommendations were provided to patient.     Barb Merino, LPN   1/61/0960   After Visit Summary: (In Person-Declined) Patient declined AVS at this time.  Nurse Notes: none

## 2023-08-06 NOTE — Assessment & Plan Note (Signed)
I will check A1c today. Continue sitagliptin (Januvia) 25 mg daily and dapagliflozin 10 mg daily.

## 2023-08-07 ENCOUNTER — Other Ambulatory Visit: Payer: Self-pay

## 2023-08-07 ENCOUNTER — Inpatient Hospital Stay: Payer: Medicare PPO | Admitting: Hematology

## 2023-08-10 DIAGNOSIS — I129 Hypertensive chronic kidney disease with stage 1 through stage 4 chronic kidney disease, or unspecified chronic kidney disease: Secondary | ICD-10-CM | POA: Diagnosis not present

## 2023-08-10 DIAGNOSIS — Z905 Acquired absence of kidney: Secondary | ICD-10-CM | POA: Diagnosis not present

## 2023-08-10 DIAGNOSIS — E1122 Type 2 diabetes mellitus with diabetic chronic kidney disease: Secondary | ICD-10-CM | POA: Diagnosis not present

## 2023-08-10 DIAGNOSIS — N184 Chronic kidney disease, stage 4 (severe): Secondary | ICD-10-CM | POA: Diagnosis not present

## 2023-08-10 DIAGNOSIS — R809 Proteinuria, unspecified: Secondary | ICD-10-CM | POA: Diagnosis not present

## 2023-08-10 DIAGNOSIS — I2510 Atherosclerotic heart disease of native coronary artery without angina pectoris: Secondary | ICD-10-CM | POA: Diagnosis not present

## 2023-08-10 DIAGNOSIS — N400 Enlarged prostate without lower urinary tract symptoms: Secondary | ICD-10-CM | POA: Diagnosis not present

## 2023-08-10 DIAGNOSIS — N1832 Chronic kidney disease, stage 3b: Secondary | ICD-10-CM | POA: Diagnosis not present

## 2023-08-14 DIAGNOSIS — Z961 Presence of intraocular lens: Secondary | ICD-10-CM | POA: Diagnosis not present

## 2023-08-14 DIAGNOSIS — E119 Type 2 diabetes mellitus without complications: Secondary | ICD-10-CM | POA: Diagnosis not present

## 2023-08-16 ENCOUNTER — Inpatient Hospital Stay: Payer: Medicare PPO | Admitting: Hematology

## 2023-08-16 ENCOUNTER — Inpatient Hospital Stay: Payer: Medicare PPO | Attending: Hematology

## 2023-08-16 ENCOUNTER — Other Ambulatory Visit: Payer: Medicare PPO

## 2023-08-16 ENCOUNTER — Other Ambulatory Visit: Payer: Self-pay

## 2023-08-16 ENCOUNTER — Ambulatory Visit: Payer: Medicare PPO | Admitting: Hematology

## 2023-08-16 ENCOUNTER — Encounter: Payer: Self-pay | Admitting: Hematology

## 2023-08-16 ENCOUNTER — Inpatient Hospital Stay: Payer: Medicare PPO

## 2023-08-16 ENCOUNTER — Ambulatory Visit: Payer: Medicare PPO

## 2023-08-16 VITALS — BP 149/94 | HR 71 | Temp 97.4°F | Resp 16 | Ht 70.0 in | Wt 215.8 lb

## 2023-08-16 DIAGNOSIS — C679 Malignant neoplasm of bladder, unspecified: Secondary | ICD-10-CM

## 2023-08-16 DIAGNOSIS — Z7901 Long term (current) use of anticoagulants: Secondary | ICD-10-CM | POA: Insufficient documentation

## 2023-08-16 DIAGNOSIS — I10 Essential (primary) hypertension: Secondary | ICD-10-CM | POA: Insufficient documentation

## 2023-08-16 DIAGNOSIS — C7919 Secondary malignant neoplasm of other urinary organs: Secondary | ICD-10-CM | POA: Insufficient documentation

## 2023-08-16 DIAGNOSIS — R350 Frequency of micturition: Secondary | ICD-10-CM | POA: Insufficient documentation

## 2023-08-16 DIAGNOSIS — C680 Malignant neoplasm of urethra: Secondary | ICD-10-CM | POA: Diagnosis not present

## 2023-08-16 DIAGNOSIS — C689 Malignant neoplasm of urinary organ, unspecified: Secondary | ICD-10-CM

## 2023-08-16 DIAGNOSIS — G479 Sleep disorder, unspecified: Secondary | ICD-10-CM | POA: Diagnosis not present

## 2023-08-16 DIAGNOSIS — E119 Type 2 diabetes mellitus without complications: Secondary | ICD-10-CM | POA: Diagnosis not present

## 2023-08-16 DIAGNOSIS — Z5112 Encounter for antineoplastic immunotherapy: Secondary | ICD-10-CM | POA: Insufficient documentation

## 2023-08-16 DIAGNOSIS — Z79899 Other long term (current) drug therapy: Secondary | ICD-10-CM | POA: Insufficient documentation

## 2023-08-16 DIAGNOSIS — Z7984 Long term (current) use of oral hypoglycemic drugs: Secondary | ICD-10-CM | POA: Diagnosis not present

## 2023-08-16 DIAGNOSIS — I4819 Other persistent atrial fibrillation: Secondary | ICD-10-CM | POA: Insufficient documentation

## 2023-08-16 LAB — CBC WITH DIFFERENTIAL (CANCER CENTER ONLY)
Abs Immature Granulocytes: 0.03 10*3/uL (ref 0.00–0.07)
Basophils Absolute: 0.1 10*3/uL (ref 0.0–0.1)
Basophils Relative: 1 %
Eosinophils Absolute: 0.2 10*3/uL (ref 0.0–0.5)
Eosinophils Relative: 2 %
HCT: 44.6 % (ref 39.0–52.0)
Hemoglobin: 14.7 g/dL (ref 13.0–17.0)
Immature Granulocytes: 0 %
Lymphocytes Relative: 15 %
Lymphs Abs: 1.4 10*3/uL (ref 0.7–4.0)
MCH: 29.5 pg (ref 26.0–34.0)
MCHC: 33 g/dL (ref 30.0–36.0)
MCV: 89.4 fL (ref 80.0–100.0)
Monocytes Absolute: 1.1 10*3/uL — ABNORMAL HIGH (ref 0.1–1.0)
Monocytes Relative: 12 %
Neutro Abs: 6.6 10*3/uL (ref 1.7–7.7)
Neutrophils Relative %: 70 %
Platelet Count: 160 10*3/uL (ref 150–400)
RBC: 4.99 MIL/uL (ref 4.22–5.81)
RDW: 15.3 % (ref 11.5–15.5)
WBC Count: 9.4 10*3/uL (ref 4.0–10.5)
nRBC: 0 % (ref 0.0–0.2)

## 2023-08-16 LAB — CMP (CANCER CENTER ONLY)
ALT: 9 U/L (ref 0–44)
AST: 15 U/L (ref 15–41)
Albumin: 4 g/dL (ref 3.5–5.0)
Alkaline Phosphatase: 96 U/L (ref 38–126)
Anion gap: 7 (ref 5–15)
BUN: 31 mg/dL — ABNORMAL HIGH (ref 8–23)
CO2: 21 mmol/L — ABNORMAL LOW (ref 22–32)
Calcium: 9.3 mg/dL (ref 8.9–10.3)
Chloride: 112 mmol/L — ABNORMAL HIGH (ref 98–111)
Creatinine: 2.51 mg/dL — ABNORMAL HIGH (ref 0.61–1.24)
GFR, Estimated: 26 mL/min — ABNORMAL LOW (ref >60)
Glucose, Bld: 177 mg/dL — ABNORMAL HIGH (ref 70–99)
Potassium: 3.9 mmol/L (ref 3.5–5.1)
Sodium: 140 mmol/L (ref 135–145)
Total Bilirubin: 2.2 mg/dL — ABNORMAL HIGH (ref 0.3–1.2)
Total Protein: 7.5 g/dL (ref 6.5–8.1)

## 2023-08-16 MED ORDER — SODIUM CHLORIDE 0.9 % IV SOLN: Freq: Once | INTRAVENOUS | Status: AC

## 2023-08-16 MED ORDER — SODIUM CHLORIDE 0.9 % IV SOLN
200.0000 mg | Freq: Once | INTRAVENOUS | Status: DC
Start: 2023-08-16 — End: 2023-08-16

## 2023-08-16 MED ORDER — SODIUM CHLORIDE 0.9% FLUSH: 10.0000 mL | INTRAVENOUS | Status: DC | PRN

## 2023-08-16 NOTE — Progress Notes (Signed)
[facility]   Telephone:([phone]) [phone] [phone]   Clinic Follow up Note   Patient Care Team: Loyola Mast, MD as PCP - General (Family Medicine) Rollene Rotunda, MD as PCP - Cardiology (Cardiology) Heloise Purpura, MD as Consulting Physician (Urology) Rollene Rotunda, MD as Consulting Physician (Cardiology) Estanislado Emms, MD as Consulting Physician (Nephrology) Malachy Mood, MD as Consulting Physician (Hematology and Oncology) [facility]  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  He has had multiple BCG maintenance therapy, is BCG refractory. -Given his BCG refractory disease, recurrence in bladder and the urethra, I recommend systemic therapy with Keytruda every 3 weeks (or every 6 weeks if he tolerates well) for 2 years, per NCCN guideline.  He had no history of autoimmune disease, overall in good health, there is no contraindication for immunotherapy. --he started on 06/15/2023. C4 was postponed due to dyspnea and hypoxia on exertion, CT chest was negative for pneumonitis     Assessment and Plan    Bladder Cancer Stable disease on immunotherapy. Frequent urination and nocturia likely due to irritation from cancer in the genitourinary tract. No evidence of pneumonia or pneumonitis on recent CT scan. -Continue current immunotherapy regimen. -Recommend follow-up with urologist for potential additional medications to manage urinary  symptoms. -Consider limiting fluid intake in the evening and using a recliner for sleep to manage nocturia.  Hypertension Patient reports inconsistent use of antihypertensive medications (amlodipine and metoprolol), with elevated blood pressure noted today. -Encourage consistent use of antihypertensive medications. -Consider use of a medication reminder system to improve adherence.  Diabetes Recent HbA1c was satisfactory. Patient was advised by nephrologist to discontinue Marcelline Deist (a medication for diabetes). -Continue current diabetes management plan. -Recommend discussion with nephrologist regarding discontinuation of Farxiga.  Atrial Fibrillation Patient has stopped anticoagulation due to bleeding concerns. Discussion of potential Watchman device placement to reduce stroke risk without need for anticoagulation. -Recommend follow-up with cardiologist regarding potential Watchman device placement.  Plan -After reviewing his treatment history, he is not due for trigger until next week.  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 Mild degradation secondary to lack of IV contrast and motion. 2. Similar to decrease in mild right-sided bladder wall thickening, nonspecific in the setting of prior bladder cancer. Most likely treatment related. 3. Borderline left  external iliac and right inguinal  adenopathy, decreased. This could represent response to therapy of metastasis or be reactive. 4. No evidence of abdominal metastasis or recurrent disease, status post right nephrectomy.   03/22/2023 Imaging   MR abdomen without contrast  IMPRESSION: 1. Mild degradation secondary to lack of IV contrast and motion. 2. Similar to decrease in mild right-sided bladder wall thickening, nonspecific in the setting of prior bladder cancer. Most likely treatment related. 3. Borderline left external iliac and right inguinal adenopathy, decreased. This could represent response to therapy of metastasis or be reactive. 4. No evidence of abdominal metastasis or recurrent disease, status post right nephrectomy.     06/08/2023 Cancer Staging   Staging form: Urinary Bladder, AJCC 8th Edition - Clinical stage from 06/08/2023: Stage 0is (cTis, cN0, cM0) - Signed by Malachy Mood, MD on 06/09/2023 WHO/ISUP grade (low/high): High Grade Histologic grading system: 2 grade system   07/31/2023 Imaging   CT chest without contrast   IMPRESSION: No findings suspicious for pneumonitis.  No evidence of metastatic disease.  Aortic Atherosclerosis (ICD10-I70.0).   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  History of Present Illness   The patient, a 77 year old male with a history of bladder cancer, presents for a follow-up visit. The patient reports difficulty sleeping due to frequent urination every 10-15 minutes. The patient has tried various methods to alleviate this issue, including sitting in the car and considering sleeping in a recliner. The patient has been dealing  with this issue for a long time and has become accustomed to it, often falling asleep on the couch during the day. The patient also reports occasional color in the urine, but no visible blood.  The patient has a history of heart attack and is on blood pressure medication, but admits to often forgetting to take the second dose at night. The patient's blood sugar levels have been good, with recent tests showing a level of six. The patient also reports having an appointment with a urologist next month.         All other systems were reviewed with the patient and are negative.  MEDICAL HISTORY:  Past Medical History:  Diagnosis Date   Abnormal radiologic findings on diagnostic imaging of renal pelvis, ureter, or bladder    bilateral ureter abnormalities   Anticoagulant long-term use    eliquis   Anxiety    Arthritis    CAD (coronary artery disease) cardiologist-- dr hochrein   NSTEMI 02-04-2014  per cardiac cath chronic occluded RCA w/ faint left-to-right collaterals and aneurysmal LCFx with sluggish coronary flow/   NSTEMI --11-21-2016 per cardiac cath occluded proximal RCA & mid to diastal CFX 100%, med rx. If that does not work, PTCA or CABG   Cancer [facility])    bladder and kidney   CKD (chronic kidney disease), stage III (HCC)    patient unaware   DOE (dyspnea on exertion)    Fatty liver    pt denies   Hematuria 02/2019   History of COVID-19 10/2019   History of non-ST elevation myocardial infarction (NSTEMI)    02-04-2014  and 11-21-2016  cardiac cath done both times ,  medically management   History of  shingles 12/2017   slight pain and numbness still noted in the area   Hyperlipidemia    Hypertension    Insomnia    Myocardial infarction [facility]) 2015   Persistent atrial fibrillation [facility])    cardiologsit-- dr hochrein   Thoracic aortic atherosclerosis (HCC)    Type 2 diabetes mellitus (HCC)    Urinary frequency     SURGICAL HISTORY: Past Surgical History:  Procedure  Laterality Date   CARDIAC CATHETERIZATION N/A 11/21/2016   Procedure: Left Heart Cath and Coronary Angiography;  Surgeon: Lennette Bihari, MD;  Location: [facility] INVASIVE CV [facility];  Service: Cardiovascular;  Laterality: N/A;  pRCA 100% , ostial LAD 45%, OM3 80%, mCFX to dCFX 100% (AV groove), lateral OM3 50%   COLONOSCOPY     CYSTOSCOPY WITH BIOPSY N/A 08/12/2020   Procedure: CYSTOSCOPY WITH BLADDER BIOPSY AND TRANSURETHRAL RESECTION OF BLADDER TUMOR;  Surgeon: Heloise Purpura, MD;  Location: [facility];  Service: Urology;  Laterality: N/A;   CYSTOSCOPY WITH BIOPSY N/A 11/10/2021   Procedure: CYSTOSCOPY WITH BLADDER BIOPSIES/ LEFT RETROGRADE;  Surgeon: Heloise Purpura, MD;  Location: [facility];  Service: Urology;  Laterality: N/A;   CYSTOSCOPY WITH BIOPSY Left 07/27/2022   Procedure: CYSTOSCOPY WITH  BLADDER BIOPSY, TRANSURETHRAL FULGERATION OF BLADDER, EXAM UNDER ANESTHESIA, LEFT RETROGRADE PYELOGRAM;  Surgeon: Heloise Purpura, MD;  Location: [facility];  Service: Urology;  Laterality: Left;   CYSTOSCOPY WITH BIOPSY N/A 04/23/2023   Procedure: CYSTOSCOPY WITH BLADDER AND PROSTATIC URETHRAL BIOPSIES;  Surgeon: Heloise Purpura, MD;  Location: [facility];  Service: Urology;  Laterality: N/A;  60 MINUTES NEEDED FOR CASE   CYSTOSCOPY WITH URETEROSCOPY AND STENT PLACEMENT Right 12/15/2019   Procedure: CYSTOSCOPY WITH RIGHT RETROGRADE/ RIGHT URETEROSCOPY/ BIOPSY;  Surgeon: Ihor Gully, MD;  Location: [facility];  Service: Urology;  Laterality: Right;   CYSTOSCOPY/RETROGRADE/URETEROSCOPY Bilateral 03/18/2018   Procedure: CYSTOSCOPY/RETROGRADE/URETEROSCOPY.;  Surgeon: Ihor Gully, MD;  [facility];  Service: Urology;  Laterality: Bilateral;   EYE SURGERY Bilateral    cateract in January 2023   HYDROCELE EXCISION Left 07/27/2022   Procedure: HYDROCELE REPAIR;  Surgeon: Heloise Purpura, MD;  Location: [facility];  Service: Urology;  Laterality: Left;  GENERAL ANESTHESIA WITH PARALYSIS   KNEE  ARTHROSCOPY Right    LEFT HEART CATHETERIZATION WITH CORONARY ANGIOGRAM N/A 02/04/2014   Procedure: LEFT HEART CATHETERIZATION WITH CORONARY ANGIOGRAM;  Surgeon: Iran Ouch, MD;  Location: [facility] CATH [facility];  Service: Cardiovascular;  Laterality: N/A;  severe one-vessel CAD, chronically occluded RCA with faint left-to-right collaterals;  aneurysmal LCFx with sluggish coronary flow;  normal LVSF w/ moderately elevated LVEDP (ostialOM2 20%, pOM3 20%, pD1 20%, mCFX 50%, diffuse 20% pCFX)   PROSTATE BIOPSY N/A 08/12/2020   Procedure: BIOPSY TRANSRECTAL ULTRASONIC PROSTATE (TUBP);  Surgeon: Heloise Purpura, MD;  Location: [facility];  Service: Urology;  Laterality: N/A;   ROBOT ASSITED LAPAROSCOPIC NEPHROURETERECTOMY Right 03/22/2020   Procedure: XI ROBOT ASSITED LAPAROSCOPIC NEPHROURETERECTOMY;  Surgeon: Heloise Purpura, MD;  Location: [facility];  Service: Urology;  Laterality: Right;   TRANSTHORACIC ECHOCARDIOGRAM  11-22-2016   dr hochrein   ef 55-60%/ mild MR and TR/ moderate LAE   TRANSURETHRAL RESECTION OF BLADDER TUMOR N/A 02/10/2021   Procedure: TRANSURETHRAL RESECTION OF BLADDER TUMOR (TURBT)/ CYSTOSCOPY/ LEFT RETROGRADE;  Surgeon: Heloise Purpura, MD;  Location: [facility];  Service: Urology;  Laterality: N/A;  GENERAL ANESTHESIA WITH PARALYSIS    I have reviewed the social history and family history with the patient and they are  unchanged from previous note.  ALLERGIES:  is allergic to xarelto [rivaroxaban].  MEDICATIONS:  Current Outpatient Medications  Medication Sig Dispense Refill   amLODipine (NORVASC) 5 MG tablet Take 1 tablet (5 mg total) by mouth daily. 90 tablet 3   FARXIGA 10 MG TABS tablet Take 10 mg by mouth daily.     finasteride (PROSCAR) 5 MG tablet Take 5 mg by mouth daily.     furosemide (LASIX) 20 MG tablet Take 1 tablet (20 mg total) by mouth daily. 90 tablet 3   hydrOXYzine (VISTARIL) 25 MG capsule Take 1 capsule (25 mg total) by mouth every 8 (eight) hours as needed. 30 capsule 2    isosorbide mononitrate (IMDUR) 30 MG 24 hr tablet Take 1 tablet (30 mg total) by mouth daily. 90 tablet 1   metoprolol tartrate (LOPRESSOR) 25 MG tablet TAKE ONE TABLET BY MOUTH TWICE A DAY 180 tablet 2   mupirocin cream (BACTROBAN) 2 % Apply 1 Application topically 2 (two) times daily. (Patient not taking: Reported on 07/05/2023) 15 g 0   nitroGLYCERIN (NITROSTAT) 0.4 MG SL tablet TAKE ONE TABLET UNDER THE TONGUE EVERY FIVE MINUTES FOR THREE DOSES AS NEEDED FOR CHEST PAIN, CALL 911 IF 2ND DOSE DOESN'T HELP 25 tablet 4   olmesartan (BENICAR) 40 MG tablet Take 40 mg by mouth daily.     oxybutynin (DITROPAN-XL) 5 MG 24 hr tablet Take 1 tablet (5 mg total) by mouth at bedtime. 30 tablet 0   Pembrolizumab (KEYTRUDA IV) Inject into the vein.     rosuvastatin (CRESTOR) 40 MG tablet Take 1 tablet (40 mg total) by mouth every morning. Please keep scheduled appointment 90 tablet 3   sitaGLIPtin (JANUVIA) 25 MG tablet TAKE ONE TABLET BY MOUTH DAILY 60 tablet 0   sodium bicarbonate 650 MG tablet Take 650 mg by mouth 2 (two) times daily.     tamsulosin (FLOMAX) 0.4 MG CAPS capsule Take 1 capsule (0.4 mg total) by mouth daily. 30 capsule 3   No current facility-administered medications for this visit.    PHYSICAL EXAMINATION: ECOG PERFORMANCE STATUS: 1 - Symptomatic but completely ambulatory  Vitals:   08/16/23 1352  BP: (!) 149/94  Pulse: 71  Resp: 16  Temp: (!) 97.4 F (36.3 C)  SpO2: 94%   Wt Readings from Last 3 Encounters:  08/16/23 215 lb 12.8 oz (97.9 kg)  08/06/23 217 lb (98.4 kg)  08/06/23 217 lb 12.8 oz (98.8 kg)     GENERAL:alert, no distress and comfortable SKIN: skin color, texture, turgor are normal, no rashes or significant lesions EYES: normal, Conjunctiva are pink and non-injected, sclera clear NECK: supple, thyroid normal size, non-tender, without nodularity LYMPH:  no palpable lymphadenopathy in the cervical, axillary  LUNGS: clear to auscultation and percussion with  normal breathing effort HEART: regular rate & rhythm and no murmurs and no lower extremity edema ABDOMEN:abdomen soft, non-tender and normal bowel sounds Musculoskeletal:no cyanosis of digits and no clubbing  NEURO: alert & oriented x 3 with fluent speech, no focal motor/sensory deficits    LABORATORY DATA:  I have reviewed the data as listed    Latest Ref Rng & Units 08/16/2023    1:31 PM 08/03/2023    2:19 PM 07/26/2023    1:45 PM  CBC  WBC 4.0 - 10.5 K/uL 9.4  8.0  8.9   Hemoglobin 13.0 - 17.0 g/dL 96.2  95.2  84.1   Hematocrit 39.0 - 52.0 % 44.6  43.3  44.4   Platelets  150 - 400 K/uL 160  143  135         Latest Ref Rng & Units 08/16/2023    1:31 PM 08/06/2023    1:33 PM 08/03/2023    2:19 PM  CMP  Glucose 70 - 99 mg/dL 161  096  045   BUN 8 - 23 mg/dL 31   31   Creatinine 4.09 - 1.24 mg/dL 8.11   9.14   Sodium 782 - 145 mmol/L 140   140   Potassium 3.5 - 5.1 mmol/L 3.9   4.2   Chloride 98 - 111 mmol/L 112   113   CO2 22 - 32 mmol/L 21   21   Calcium 8.9 - 10.3 mg/dL 9.3   9.3   Total Protein 6.5 - 8.1 g/dL 7.5   7.2   Total Bilirubin 0.3 - 1.2 mg/dL 2.2   3.2   Alkaline Phos 38 - 126 U/L 96   76   AST 15 - 41 U/L 15   16   ALT 0 - 44 U/L 9   14       RADIOGRAPHIC STUDIES: I have personally reviewed the radiological images as listed and agreed with the findings in the report. No results found.    Orders Placed This Encounter  Procedures   CBC with Differential (Cancer Center Only)    Standing Status:   Future    Standing Expiration Date:   10/03/2024   CMP (Cancer Center only)    Standing Status:   Future    Standing Expiration Date:   10/03/2024   T4    Standing Status:   Future    Standing Expiration Date:   10/03/2024   TSH    Standing Status:   Future    Standing Expiration Date:   10/03/2024   CBC with Differential (Cancer Center Only)    Standing Status:   Future    Standing Expiration Date:   10/24/2024   CMP (Cancer Center only)    Standing  Status:   Future    Standing Expiration Date:   10/24/2024   CBC with Differential (Cancer Center Only)    Standing Status:   Future    Standing Expiration Date:   11/14/2024   CMP (Cancer Center only)    Standing Status:   Future    Standing Expiration Date:   11/14/2024   CBC with Differential (Cancer Center Only)    Standing Status:   Future    Standing Expiration Date:   08/22/2024   CMP (Cancer Center only)    Standing Status:   Future    Standing Expiration Date:   08/22/2024   All questions were answered. The patient knows to call the clinic with any problems, questions or concerns. No barriers to learning was detected. The total time spent in the appointment was 25 minutes.     Malachy Mood, MD 08/16/2023

## 2023-08-16 NOTE — Assessment & Plan Note (Signed)
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  He has had multiple BCG maintenance therapy, is BCG refractory. -Given his BCG refractory disease, recurrence in bladder and the urethra, I recommend systemic therapy with Keytruda every 3 weeks (or every 6 weeks if he tolerates well) for 2 years, per NCCN guideline.  He had no history of autoimmune disease, overall in good health, there is no contraindication for immunotherapy. --he started on 06/15/2023. C4 was postponed due to dyspnea and hypoxia on exertion, CT chest was negative for pneumonitis

## 2023-08-16 NOTE — Progress Notes (Signed)
Patient too early for treatment. Patient notified to watch for scheduling call.

## 2023-08-17 ENCOUNTER — Encounter: Payer: Self-pay | Admitting: Hematology

## 2023-08-22 ENCOUNTER — Other Ambulatory Visit: Payer: Medicare PPO

## 2023-08-22 ENCOUNTER — Inpatient Hospital Stay: Payer: Medicare PPO

## 2023-08-22 VITALS — BP 128/84 | HR 71 | Temp 97.8°F | Resp 20 | Wt 215.1 lb

## 2023-08-22 DIAGNOSIS — C7919 Secondary malignant neoplasm of other urinary organs: Secondary | ICD-10-CM | POA: Diagnosis not present

## 2023-08-22 DIAGNOSIS — I10 Essential (primary) hypertension: Secondary | ICD-10-CM | POA: Diagnosis not present

## 2023-08-22 DIAGNOSIS — I4819 Other persistent atrial fibrillation: Secondary | ICD-10-CM | POA: Diagnosis not present

## 2023-08-22 DIAGNOSIS — C680 Malignant neoplasm of urethra: Secondary | ICD-10-CM | POA: Diagnosis not present

## 2023-08-22 DIAGNOSIS — C679 Malignant neoplasm of bladder, unspecified: Secondary | ICD-10-CM

## 2023-08-22 DIAGNOSIS — R350 Frequency of micturition: Secondary | ICD-10-CM | POA: Diagnosis not present

## 2023-08-22 DIAGNOSIS — Z7901 Long term (current) use of anticoagulants: Secondary | ICD-10-CM | POA: Diagnosis not present

## 2023-08-22 DIAGNOSIS — G479 Sleep disorder, unspecified: Secondary | ICD-10-CM | POA: Diagnosis not present

## 2023-08-22 DIAGNOSIS — E119 Type 2 diabetes mellitus without complications: Secondary | ICD-10-CM | POA: Diagnosis not present

## 2023-08-22 DIAGNOSIS — Z5112 Encounter for antineoplastic immunotherapy: Secondary | ICD-10-CM | POA: Diagnosis not present

## 2023-08-22 LAB — CMP (CANCER CENTER ONLY)
ALT: 8 U/L (ref 0–44)
AST: 13 U/L — ABNORMAL LOW (ref 15–41)
Albumin: 4 g/dL (ref 3.5–5.0)
Alkaline Phosphatase: 86 U/L (ref 38–126)
Anion gap: 8 (ref 5–15)
BUN: 34 mg/dL — ABNORMAL HIGH (ref 8–23)
CO2: 21 mmol/L — ABNORMAL LOW (ref 22–32)
Calcium: 9.4 mg/dL (ref 8.9–10.3)
Chloride: 112 mmol/L — ABNORMAL HIGH (ref 98–111)
Creatinine: 2.43 mg/dL — ABNORMAL HIGH (ref 0.61–1.24)
GFR, Estimated: 27 mL/min — ABNORMAL LOW (ref >60)
Glucose, Bld: 177 mg/dL — ABNORMAL HIGH (ref 70–99)
Potassium: 3.8 mmol/L (ref 3.5–5.1)
Sodium: 141 mmol/L (ref 135–145)
Total Bilirubin: 2.2 mg/dL — ABNORMAL HIGH (ref 0.3–1.2)
Total Protein: 7.4 g/dL (ref 6.5–8.1)

## 2023-08-22 LAB — CBC WITH DIFFERENTIAL (CANCER CENTER ONLY)
Abs Immature Granulocytes: 0.02 10*3/uL (ref 0.00–0.07)
Basophils Absolute: 0.1 10*3/uL (ref 0.0–0.1)
Basophils Relative: 1 %
Eosinophils Absolute: 0.2 10*3/uL (ref 0.0–0.5)
Eosinophils Relative: 2 %
HCT: 45.5 % (ref 39.0–52.0)
Hemoglobin: 14.8 g/dL (ref 13.0–17.0)
Immature Granulocytes: 0 %
Lymphocytes Relative: 14 %
Lymphs Abs: 1.2 10*3/uL (ref 0.7–4.0)
MCH: 28.8 pg (ref 26.0–34.0)
MCHC: 32.5 g/dL (ref 30.0–36.0)
MCV: 88.7 fL (ref 80.0–100.0)
Monocytes Absolute: 1 10*3/uL (ref 0.1–1.0)
Monocytes Relative: 12 %
Neutro Abs: 5.9 10*3/uL (ref 1.7–7.7)
Neutrophils Relative %: 71 %
Platelet Count: 161 10*3/uL (ref 150–400)
RBC: 5.13 MIL/uL (ref 4.22–5.81)
RDW: 14.7 % (ref 11.5–15.5)
WBC Count: 8.4 10*3/uL (ref 4.0–10.5)
nRBC: 0 % (ref 0.0–0.2)

## 2023-08-22 MED ORDER — SODIUM CHLORIDE 0.9% FLUSH: 10.0000 mL | INTRAVENOUS | Status: DC | PRN

## 2023-08-22 MED ORDER — SODIUM CHLORIDE 0.9 % IV SOLN: Freq: Once | INTRAVENOUS | Status: AC

## 2023-08-22 MED ORDER — SODIUM CHLORIDE 0.9 % IV SOLN
200.0000 mg | Freq: Once | INTRAVENOUS | Status: AC
  Administered 2023-08-22: 200 mg via INTRAVENOUS
  Filled 2023-08-22: qty 8

## 2023-08-22 NOTE — Patient Instructions (Signed)

## 2023-08-23 ENCOUNTER — Ambulatory Visit: Payer: Medicare PPO | Admitting: Hematology

## 2023-08-23 ENCOUNTER — Ambulatory Visit: Payer: Medicare PPO

## 2023-08-23 ENCOUNTER — Other Ambulatory Visit: Payer: Medicare PPO

## 2023-08-24 ENCOUNTER — Other Ambulatory Visit: Payer: Self-pay | Admitting: Family Medicine

## 2023-08-24 ENCOUNTER — Other Ambulatory Visit: Payer: Self-pay | Admitting: Cardiology

## 2023-08-24 DIAGNOSIS — E785 Hyperlipidemia, unspecified: Secondary | ICD-10-CM

## 2023-08-24 DIAGNOSIS — I10 Essential (primary) hypertension: Secondary | ICD-10-CM

## 2023-08-28 ENCOUNTER — Inpatient Hospital Stay: Payer: Medicare PPO

## 2023-08-28 ENCOUNTER — Telehealth: Payer: Self-pay

## 2023-08-28 ENCOUNTER — Other Ambulatory Visit: Payer: Self-pay

## 2023-08-28 DIAGNOSIS — G479 Sleep disorder, unspecified: Secondary | ICD-10-CM | POA: Diagnosis not present

## 2023-08-28 DIAGNOSIS — C679 Malignant neoplasm of bladder, unspecified: Secondary | ICD-10-CM

## 2023-08-28 DIAGNOSIS — Z5112 Encounter for antineoplastic immunotherapy: Secondary | ICD-10-CM | POA: Diagnosis not present

## 2023-08-28 DIAGNOSIS — E119 Type 2 diabetes mellitus without complications: Secondary | ICD-10-CM | POA: Diagnosis not present

## 2023-08-28 DIAGNOSIS — I4819 Other persistent atrial fibrillation: Secondary | ICD-10-CM | POA: Diagnosis not present

## 2023-08-28 DIAGNOSIS — Z7901 Long term (current) use of anticoagulants: Secondary | ICD-10-CM | POA: Diagnosis not present

## 2023-08-28 DIAGNOSIS — R350 Frequency of micturition: Secondary | ICD-10-CM

## 2023-08-28 DIAGNOSIS — N390 Urinary tract infection, site not specified: Secondary | ICD-10-CM

## 2023-08-28 DIAGNOSIS — C7919 Secondary malignant neoplasm of other urinary organs: Secondary | ICD-10-CM | POA: Diagnosis not present

## 2023-08-28 DIAGNOSIS — I10 Essential (primary) hypertension: Secondary | ICD-10-CM | POA: Diagnosis not present

## 2023-08-28 DIAGNOSIS — C680 Malignant neoplasm of urethra: Secondary | ICD-10-CM | POA: Diagnosis not present

## 2023-08-28 LAB — URINALYSIS, COMPLETE (UACMP) WITH MICROSCOPIC
Bacteria, UA: NONE SEEN
Bilirubin Urine: NEGATIVE
Glucose, UA: >=500 mg/dL — AB
Ketones, ur: NEGATIVE mg/dL
Leukocytes,Ua: NEGATIVE
Nitrite: NEGATIVE
Protein, ur: 30 mg/dL — AB
RBC / HPF: >50 RBC/hpf (ref 0–5)
Specific Gravity, Urine: 1.02 (ref 1.005–1.030)
WBC, UA: >50 WBC/hpf (ref 0–5)
pH: 6 (ref 5.0–8.0)

## 2023-08-28 NOTE — Telephone Encounter (Signed)
Pt's wife called stating the pt is having increased urine frequency and dysuria.  Pt's wife denied fever and lower back/flank pain.  Pt's wife denied blood in urine.  Pt and pt's wife stated the pt has had several UTIs in the past few months.  Pt's wife wanted to know could an abx be prescribed for the pt.  Asked if pt was taking AZO daily and drinking Cranberry juice.  Pt and wife stated "NO" but will start.  Asked if pt could come in today to give a urine specimen.  Pt stated Yes.  Lab appt scheduled for today for UA/UC.  Stated this nurse will make Dr. Mosetta Putt and her Team aware of the pt's symptoms and this nurse will f/u with pt and wife regarding their response.  Pt and wife verbalized understanding and had no further questions or concerns.

## 2023-08-29 ENCOUNTER — Telehealth: Payer: Self-pay

## 2023-08-29 LAB — URINE CULTURE: Culture: NO GROWTH

## 2023-08-29 NOTE — Telephone Encounter (Signed)
Spoke with Adam Harris and wife regarding UA results.  Informed both that Dr. Mosetta Putt has reviewed both the UA & UC and neither test are showing signs of an UTI.  Stated that Dr. Mosetta Putt did mention that the Adam Harris's urine sugar level is elevated which is an indication of the Adam Harris's Diabetes being elevated which will cause increased urinary frequency.  Instructed Adam Harris to f/u with PCP regarding his diabetes.  Adam Harris and spouse verbalized understanding.

## 2023-08-30 ENCOUNTER — Other Ambulatory Visit: Payer: Self-pay

## 2023-08-30 DIAGNOSIS — R399 Unspecified symptoms and signs involving the genitourinary system: Secondary | ICD-10-CM

## 2023-08-30 MED ORDER — TAMSULOSIN HCL 0.4 MG PO CAPS: 0.4000 mg | ORAL_CAPSULE | Freq: Every day | ORAL | 3 refills | Status: DC

## 2023-09-06 ENCOUNTER — Ambulatory Visit: Payer: Medicare PPO

## 2023-09-06 ENCOUNTER — Ambulatory Visit: Payer: Medicare PPO | Admitting: Hematology

## 2023-09-06 ENCOUNTER — Other Ambulatory Visit: Payer: Medicare PPO

## 2023-09-07 ENCOUNTER — Encounter: Payer: Self-pay | Admitting: Hematology

## 2023-09-12 ENCOUNTER — Other Ambulatory Visit: Payer: Self-pay | Admitting: Family Medicine

## 2023-09-12 DIAGNOSIS — E1159 Type 2 diabetes mellitus with other circulatory complications: Secondary | ICD-10-CM

## 2023-09-13 ENCOUNTER — Encounter: Payer: Self-pay | Admitting: Hematology

## 2023-09-13 ENCOUNTER — Inpatient Hospital Stay: Payer: Medicare PPO

## 2023-09-13 ENCOUNTER — Inpatient Hospital Stay: Payer: Medicare PPO | Admitting: Hematology

## 2023-09-13 ENCOUNTER — Other Ambulatory Visit: Payer: Self-pay

## 2023-09-13 VITALS — BP 136/96 | HR 78 | Temp 97.4°F | Resp 18 | Wt 219.4 lb

## 2023-09-13 DIAGNOSIS — E119 Type 2 diabetes mellitus without complications: Secondary | ICD-10-CM | POA: Diagnosis not present

## 2023-09-13 DIAGNOSIS — I10 Essential (primary) hypertension: Secondary | ICD-10-CM | POA: Diagnosis not present

## 2023-09-13 DIAGNOSIS — C679 Malignant neoplasm of bladder, unspecified: Secondary | ICD-10-CM

## 2023-09-13 DIAGNOSIS — Z7901 Long term (current) use of anticoagulants: Secondary | ICD-10-CM | POA: Diagnosis not present

## 2023-09-13 DIAGNOSIS — R350 Frequency of micturition: Secondary | ICD-10-CM | POA: Diagnosis not present

## 2023-09-13 DIAGNOSIS — C689 Malignant neoplasm of urinary organ, unspecified: Secondary | ICD-10-CM | POA: Diagnosis not present

## 2023-09-13 DIAGNOSIS — C680 Malignant neoplasm of urethra: Secondary | ICD-10-CM | POA: Diagnosis not present

## 2023-09-13 DIAGNOSIS — G479 Sleep disorder, unspecified: Secondary | ICD-10-CM | POA: Diagnosis not present

## 2023-09-13 DIAGNOSIS — C7919 Secondary malignant neoplasm of other urinary organs: Secondary | ICD-10-CM | POA: Diagnosis not present

## 2023-09-13 DIAGNOSIS — Z5112 Encounter for antineoplastic immunotherapy: Secondary | ICD-10-CM | POA: Diagnosis not present

## 2023-09-13 DIAGNOSIS — I4819 Other persistent atrial fibrillation: Secondary | ICD-10-CM | POA: Diagnosis not present

## 2023-09-13 LAB — CMP (CANCER CENTER ONLY)
ALT: 12 U/L (ref 0–44)
AST: 19 U/L (ref 15–41)
Albumin: 3.7 g/dL (ref 3.5–5.0)
Alkaline Phosphatase: 100 U/L (ref 38–126)
Anion gap: 4 — ABNORMAL LOW (ref 5–15)
BUN: 30 mg/dL — ABNORMAL HIGH (ref 8–23)
CO2: 24 mmol/L (ref 22–32)
Calcium: 9.3 mg/dL (ref 8.9–10.3)
Chloride: 112 mmol/L — ABNORMAL HIGH (ref 98–111)
Creatinine: 2.37 mg/dL — ABNORMAL HIGH (ref 0.61–1.24)
GFR, Estimated: 28 mL/min — ABNORMAL LOW (ref >60)
Glucose, Bld: 219 mg/dL — ABNORMAL HIGH (ref 70–99)
Potassium: 4.1 mmol/L (ref 3.5–5.1)
Sodium: 140 mmol/L (ref 135–145)
Total Bilirubin: 2 mg/dL — ABNORMAL HIGH (ref 0.3–1.2)
Total Protein: 7.1 g/dL (ref 6.5–8.1)

## 2023-09-13 LAB — CBC WITH DIFFERENTIAL (CANCER CENTER ONLY)
Abs Immature Granulocytes: 0.03 10*3/uL (ref 0.00–0.07)
Basophils Absolute: 0.1 10*3/uL (ref 0.0–0.1)
Basophils Relative: 1 %
Eosinophils Absolute: 0.2 10*3/uL (ref 0.0–0.5)
Eosinophils Relative: 2 %
HCT: 42.7 % (ref 39.0–52.0)
Hemoglobin: 14.2 g/dL (ref 13.0–17.0)
Immature Granulocytes: 0 %
Lymphocytes Relative: 13 %
Lymphs Abs: 1.2 10*3/uL (ref 0.7–4.0)
MCH: 29.2 pg (ref 26.0–34.0)
MCHC: 33.3 g/dL (ref 30.0–36.0)
MCV: 87.7 fL (ref 80.0–100.0)
Monocytes Absolute: 0.9 10*3/uL (ref 0.1–1.0)
Monocytes Relative: 9 %
Neutro Abs: 7 10*3/uL (ref 1.7–7.7)
Neutrophils Relative %: 75 %
Platelet Count: 174 10*3/uL (ref 150–400)
RBC: 4.87 MIL/uL (ref 4.22–5.81)
RDW: 14.6 % (ref 11.5–15.5)
WBC Count: 9.4 10*3/uL (ref 4.0–10.5)
nRBC: 0 % (ref 0.0–0.2)

## 2023-09-13 MED ORDER — SODIUM CHLORIDE 0.9 % IV SOLN
200.0000 mg | Freq: Once | INTRAVENOUS | Status: AC
  Administered 2023-09-13: 200 mg via INTRAVENOUS
  Filled 2023-09-13: qty 8

## 2023-09-13 MED ORDER — SODIUM CHLORIDE 0.9 % IV SOLN: Freq: Once | INTRAVENOUS | Status: AC

## 2023-09-13 NOTE — Progress Notes (Signed)
[facility]   Telephone:(336) [phone] [phone]   Clinic Follow up Note   Patient Care Team: Loyola Mast, MD as PCP - General (Family Medicine) Rollene Rotunda, MD as PCP - Cardiology (Cardiology) Heloise Purpura, MD as Consulting Physician (Urology) Rollene Rotunda, MD as Consulting Physician (Cardiology) Estanislado Emms, MD as Consulting Physician (Nephrology) Malachy Mood, MD as Consulting Physician (Hematology and Oncology) [facility]  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  He has had multiple BCG maintenance therapy, is BCG refractory. -Given his BCG refractory disease, recurrence in bladder and the urethra, I recommend systemic therapy with Keytruda every 3 weeks (or every 6 weeks if he tolerates well) for 2 years, per NCCN guideline.  He had no history of autoimmune disease, overall in good health, there is no contraindication for immunotherapy. --he started on 06/15/2023. 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 -Continue with scheduled infusions on November 21 and December 12. -He will follow-up with his urologist Dr. Laverle Patter for cystoscopy evaluation of his response to  immunotherapy  Nocturia Reports frequent urination at night, disrupting sleep. Previously tried oxybutynin without significant improvement. -Resume oxybutynin (Ditropan) 5mg  once daily, preferably at night, for a trial period to assess efficacy. -Discuss nocturia with urologist at upcoming appointment.  Hematuria Reports intermittent blood in urine, last episode a couple of weeks ago. -Plan for cystoscopy with urologist to evaluate cause of hematuria. -Discuss potential for procedural sedation with urologist to manage discomfort during cystoscopy.      Plan -Lab reviewed, adequate for treatment, will proceed Keytruda today and continue every 3 weeks -Follow-up in 3 weeks -He has appointment with Dr. 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 Mild degradation secondary to lack of IV contrast and motion. 2. Similar to decrease in mild right-sided bladder wall thickening, nonspecific in the setting of prior bladder cancer. Most likely treatment related. 3. Borderline left external iliac and right inguinal adenopathy, decreased. This could represent response to therapy of metastasis or be reactive. 4. No evidence of abdominal metastasis or recurrent disease, status post right nephrectomy.   03/22/2023 Imaging   MR abdomen without contrast  IMPRESSION: 1. Mild  degradation secondary to lack of IV contrast and motion. 2. Similar to decrease in mild  right-sided bladder wall thickening, nonspecific in the setting of prior bladder cancer. Most likely treatment related. 3. Borderline left external iliac and right inguinal adenopathy, decreased. This could represent response to therapy of metastasis or be reactive. 4. No evidence of abdominal metastasis or recurrent disease, status post right nephrectomy.     06/08/2023 Cancer Staging   Staging form: Urinary Bladder, AJCC 8th Edition - Clinical stage from 06/08/2023: Stage 0is (cTis, cN0, cM0) - Signed by Malachy Mood, MD on 06/09/2023 WHO/ISUP grade (low/high): High Grade Histologic grading system: 2 grade system   07/31/2023 Imaging   CT chest without contrast   IMPRESSION: No findings suspicious for pneumonitis.  No evidence of metastatic disease.  Aortic Atherosclerosis (ICD10-I70.0).   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  History of Present Illness   Adam Harris, a 77 year old gentleman with a history of blood cancer, presents for a follow-up visit. He reports no issues with his recent chemotherapy infusion, denying fatigue, rash, joint pain, or diarrhea. His bowel movements have been regular and his breathing has remained stable.  However, he expresses concern about frequent urination at night, which has been interrupting his sleep. He reports needing to urinate at least five to six times per night, leading to daytime fatigue. This issue has been ongoing, and previous attempts to manage it with oxybutynin (Ditropan) have not been successful.  In addition, Adam Harris mentions occasional hematuria, which has been occurring intermittently for some time. The last episode  was a couple of weeks ago. He is scheduled to see his urologist next week and is anticipating a cystoscopy, despite his apprehension about the procedure.         All other systems were reviewed with the patient and are negative.  MEDICAL HISTORY:  Past Medical History:  Diagnosis Date   Abnormal radiologic findings on diagnostic imaging of renal pelvis, ureter, or bladder    bilateral ureter abnormalities   Anticoagulant long-term use    eliquis   Anxiety    Arthritis    CAD (coronary artery disease) cardiologist-- dr hochrein   NSTEMI 02-04-2014  per cardiac cath chronic occluded RCA w/ faint left-to-right collaterals and aneurysmal LCFx with sluggish coronary flow/   NSTEMI --11-21-2016 per cardiac cath occluded proximal RCA & mid to diastal CFX 100%, med rx. If that does not work, PTCA or CABG   Cancer [facility])    bladder and kidney   CKD (chronic kidney disease), stage III (HCC)    patient unaware   DOE (dyspnea on exertion)    Fatty liver    pt denies   Hematuria 02/2019   History of COVID-19 10/2019   History of non-ST elevation myocardial infarction (NSTEMI)    02-04-2014  and 11-21-2016  cardiac cath done both times ,  medically management   History of shingles 12/2017   slight pain and numbness still noted in the area   Hyperlipidemia    Hypertension    Insomnia    Myocardial infarction [facility]) 2015   Persistent atrial fibrillation [facility])    cardiologsit-- dr Antoine Poche  Thoracic aortic atherosclerosis (HCC)    Type 2 diabetes mellitus (HCC)    Urinary frequency     SURGICAL HISTORY: Past Surgical History:  Procedure Laterality Date   CARDIAC CATHETERIZATION N/A 11/21/2016   Procedure: Left Heart Cath and Coronary Angiography;  Surgeon: Lennette Bihari, MD;  Location: [facility] INVASIVE CV [facility];  Service: Cardiovascular;  Laterality: N/A;  pRCA 100% , ostial LAD 45%, OM3 80%, mCFX to dCFX 100% (AV groove), lateral OM3 50%   COLONOSCOPY     CYSTOSCOPY WITH BIOPSY N/A  08/12/2020   Procedure: CYSTOSCOPY WITH BLADDER BIOPSY AND TRANSURETHRAL RESECTION OF BLADDER TUMOR;  Surgeon: Heloise Purpura, MD;  Location: [facility] ORS;  Service: Urology;  Laterality: N/A;   CYSTOSCOPY WITH BIOPSY N/A 11/10/2021   Procedure: CYSTOSCOPY WITH BLADDER BIOPSIES/ LEFT RETROGRADE;  Surgeon: Heloise Purpura, MD;  Location: [facility];  Service: Urology;  Laterality: N/A;   CYSTOSCOPY WITH BIOPSY Left 07/27/2022   Procedure: CYSTOSCOPY WITH  BLADDER BIOPSY, TRANSURETHRAL FULGERATION OF BLADDER, EXAM UNDER ANESTHESIA, LEFT RETROGRADE PYELOGRAM;  Surgeon: Heloise Purpura, MD;  Location: [facility];  Service: Urology;  Laterality: Left;   CYSTOSCOPY WITH BIOPSY N/A 04/23/2023   Procedure: CYSTOSCOPY WITH BLADDER AND PROSTATIC URETHRAL BIOPSIES;  Surgeon: Heloise Purpura, MD;  Location: [facility];  Service: Urology;  Laterality: N/A;  60 MINUTES NEEDED FOR CASE   CYSTOSCOPY WITH URETEROSCOPY AND STENT PLACEMENT Right 12/15/2019   Procedure: CYSTOSCOPY WITH RIGHT RETROGRADE/ RIGHT URETEROSCOPY/ BIOPSY;  Surgeon: Ihor Gully, MD;  Location: [facility];  Service: Urology;  Laterality: Right;   CYSTOSCOPY/RETROGRADE/URETEROSCOPY Bilateral 03/18/2018   Procedure: CYSTOSCOPY/RETROGRADE/URETEROSCOPY.;  Surgeon: Ihor Gully, MD;  Location: [facility];  Service: Urology;  Laterality: Bilateral;   EYE SURGERY Bilateral    cateract in January 2023   HYDROCELE EXCISION Left 07/27/2022   Procedure: HYDROCELE REPAIR;  Surgeon: Heloise Purpura, MD;  Location: [facility];  Service: Urology;  Laterality: Left;  GENERAL ANESTHESIA WITH PARALYSIS   KNEE ARTHROSCOPY Right    LEFT HEART CATHETERIZATION WITH CORONARY ANGIOGRAM N/A 02/04/2014   Procedure: LEFT HEART CATHETERIZATION WITH CORONARY ANGIOGRAM;  Surgeon: Iran Ouch, MD;  Location: [facility] CATH LAB;  Service: Cardiovascular;  Laterality: N/A;  severe one-vessel CAD, chronically occluded RCA with faint left-to-right collaterals;  aneurysmal  LCFx with sluggish coronary flow;  normal LVSF w/ moderately elevated LVEDP (ostialOM2 20%, pOM3 20%, pD1 20%, mCFX 50%, diffuse 20% pCFX)   PROSTATE BIOPSY N/A 08/12/2020   Procedure: BIOPSY TRANSRECTAL ULTRASONIC PROSTATE (TUBP);  Surgeon: Heloise Purpura, MD;  Location: [facility];  Service: Urology;  Laterality: N/A;   ROBOT ASSITED LAPAROSCOPIC NEPHROURETERECTOMY Right 03/22/2020   Procedure: XI ROBOT ASSITED LAPAROSCOPIC NEPHROURETERECTOMY;  Surgeon: Heloise Purpura, MD;  Location: [facility];  Service: Urology;  Laterality: Right;   TRANSTHORACIC ECHOCARDIOGRAM  11-22-2016   dr hochrein   ef 55-60%/ mild MR and TR/ moderate LAE   TRANSURETHRAL RESECTION OF BLADDER TUMOR N/A 02/10/2021   Procedure: TRANSURETHRAL RESECTION OF BLADDER TUMOR (TURBT)/ CYSTOSCOPY/ LEFT RETROGRADE;  Surgeon: Heloise Purpura, MD;  Location: [facility];  Service: Urology;  Laterality: N/A;  GENERAL ANESTHESIA WITH PARALYSIS    I have reviewed the social history and family history with the patient and they are unchanged from previous note.  ALLERGIES:  is allergic to xarelto [rivaroxaban].  MEDICATIONS:  Current Outpatient Medications  Medication Sig Dispense Refill   amLODipine (NORVASC) 5 MG tablet Take 1 tablet (5 mg total) by mouth daily. 90 tablet 3   FARXIGA  10 MG TABS tablet Take 10 mg by mouth daily.     finasteride (PROSCAR) 5 MG tablet Take 5 mg by mouth daily.     furosemide (LASIX) 20 MG tablet Take 1 tablet (20 mg total) by mouth daily. 90 tablet 3   hydrOXYzine (VISTARIL) 25 MG capsule Take 1 capsule (25 mg total) by mouth every 8 (eight) hours as needed. 30 capsule 2   isosorbide mononitrate (IMDUR) 30 MG 24 hr tablet TAKE ONE TABLET BY MOUTH DAILY 90 tablet 3   metoprolol tartrate (LOPRESSOR) 25 MG tablet TAKE ONE TABLET BY MOUTH TWICE A DAY 180 tablet 2   mupirocin cream (BACTROBAN) 2 % Apply 1 Application topically 2 (two) times daily. (Patient not taking: Reported on 07/05/2023) 15 g 0   nitroGLYCERIN  (NITROSTAT) 0.4 MG SL tablet TAKE ONE TABLET UNDER THE TONGUE EVERY FIVE MINUTES FOR THREE DOSES AS NEEDED FOR CHEST PAIN, CALL 911 IF 2ND DOSE DOESN'T HELP 25 tablet 4   olmesartan (BENICAR) 40 MG tablet Take 40 mg by mouth daily.     oxybutynin (DITROPAN-XL) 5 MG 24 hr tablet Take 1 tablet (5 mg total) by mouth at bedtime. 30 tablet 0   Pembrolizumab (KEYTRUDA IV) Inject into the vein.     rosuvastatin (CRESTOR) 40 MG tablet TAKE 1 TABLET (40 MG TOTAL) BY MOUTH EVERY MORNING. PLEASE KEEP SCHEDULED APPOINTMENT 90 tablet 3   sitaGLIPtin (JANUVIA) 25 MG tablet TAKE ONE TABLET BY MOUTH DAILY 60 tablet 1   sodium bicarbonate 650 MG tablet Take 650 mg by mouth 2 (two) times daily.     tamsulosin (FLOMAX) 0.4 MG CAPS capsule Take 1 capsule (0.4 mg total) by mouth daily. 30 capsule 3   No current facility-administered medications for this visit.    PHYSICAL EXAMINATION: ECOG PERFORMANCE STATUS: 1 - Symptomatic but completely ambulatory  Vitals:   09/13/23 1335  BP: (!) 136/96  Pulse: 78  Resp: 18  Temp: (!) 97.4 F (36.3 C)  SpO2: 94%   Wt Readings from Last 3 Encounters:  09/13/23 219 lb 6.4 oz (99.5 kg)  08/22/23 215 lb 1.3 oz (97.6 kg)  08/16/23 215 lb 12.8 oz (97.9 kg)     GENERAL:alert, no distress and comfortable SKIN: skin color, texture, turgor are normal, no rashes or significant lesions EYES: normal, Conjunctiva are pink and non-injected, sclera clear NECK: supple, thyroid normal size, non-tender, without nodularity LYMPH:  no palpable lymphadenopathy in the cervical, axillary  LUNGS: clear to auscultation and percussion with normal breathing effort HEART: regular rate & rhythm and no murmurs and no lower extremity edema ABDOMEN:abdomen soft, non-tender and normal bowel sounds Musculoskeletal:no cyanosis of digits and no clubbing  NEURO: alert & oriented x 3 with fluent speech, no focal motor/sensory deficits   LABORATORY DATA:  I have reviewed the data as listed     Latest Ref Rng & Units 09/13/2023    1:15 PM 08/22/2023    8:24 AM 08/16/2023    1:31 PM  CBC  WBC 4.0 - 10.5 K/uL 9.4  8.4  9.4   Hemoglobin 13.0 - 17.0 g/dL 16.1  09.6  04.5   Hematocrit 39.0 - 52.0 % 42.7  45.5  44.6   Platelets 150 - 400 K/uL 174  161  160         Latest Ref Rng & Units 09/13/2023    1:15 PM 08/22/2023    8:24 AM 08/16/2023    1:31 PM  CMP  Glucose 70 - 99  mg/dL 563  875  643   BUN 8 - 23 mg/dL 30  34  31   Creatinine 0.61 - 1.24 mg/dL 3.29  5.18  8.41   Sodium 135 - 145 mmol/L 140  141  140   Potassium 3.5 - 5.1 mmol/L 4.1  3.8  3.9   Chloride 98 - 111 mmol/L 112  112  112   CO2 22 - 32 mmol/L 24  21  21    Calcium 8.9 - 10.3 mg/dL 9.3  9.4  9.3   Total Protein 6.5 - 8.1 g/dL 7.1  7.4  7.5   Total Bilirubin 0.3 - 1.2 mg/dL 2.0  2.2  2.2   Alkaline Phos 38 - 126 U/L 100  86  96   AST 15 - 41 U/L 19  13  15    ALT 0 - 44 U/L 12  8  9        RADIOGRAPHIC STUDIES: I have personally reviewed the radiological images as listed and agreed with the findings in the report. No results found.    No orders of the defined types were placed in this encounter.  All questions were answered. The patient knows to call the clinic with any problems, questions or concerns. No barriers to learning was detected. The total time spent in the appointment was 25 minutes.     Malachy Mood, MD 09/13/2023

## 2023-09-13 NOTE — Assessment & Plan Note (Signed)
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  He has had multiple BCG maintenance therapy, is BCG refractory. -Given his BCG refractory disease, recurrence in bladder and the urethra, I recommend systemic therapy with Keytruda every 3 weeks (or every 6 weeks if he tolerates well) for 2 years, per NCCN guideline.  He had no history of autoimmune disease, overall in good health, there is no contraindication for immunotherapy. --he started on 06/15/2023. C4 was postponed due to dyspnea and hypoxia on exertion, CT chest was negative for pneumonitis

## 2023-09-13 NOTE — Patient Instructions (Signed)

## 2023-09-23 NOTE — Progress Notes (Unsigned)
Cardiology Office Note:   Date:  09/26/2023  ID:  Adam Harris, DOB [birth year], MRN [id number] PCP: Loyola Mast, MD  [facility] Providers Cardiologist:  Rollene Rotunda, MD {  History of Present Illness:   Adam Harris is a 77 y.o. male who presents for atrial fib and CAD and atrial fibrillation.  Most recent cardiac cath  01/2014 revealed severe 1 vessel disease with CTO RCA with faint left to right collaterals, aneurysmal LCFx with sluggish coronary flow;  normal LVSF w/ moderately elevated LVEDP (ostial OM2 20%, pOM3 20%, pD1 20%, mCFX 50%, diffuse 20% pCFX). Chronic diastolic CHF, atrial fib and HL.   He had well preserved EF on echo July 2023.     Since I last saw him he has done okay and has no new cardiovascular complaints. The patient denies any new symptoms such as chest discomfort, neck or arm discomfort. There has been no new shortness of breath, PND or orthopnea. There have been no reported palpitations, presyncope or syncope.  He is currently not taking his Eliquis.  This was stopped prior to cystoscopy and has had treatment for recurrent urethral carcinoma in situ of the GI tract.  Actually reviewed oncology notes.  He is on immunotherapy but might need new palliative resection.  He has follow-up with urology and another cystoscopy.  I did talk with the oncologist today to get a clearer picture on this.  I have a note into his urologist to see if I can restart his Eliquis.  I had a long conversation with the patient and he is not having any active bleeding.  He actually feels okay.  He is not having any new shortness of breath, PND or orthopnea.  He is not having any presyncope or syncope.  He does his yard work.   ROS: As stated in the HPI and negative for all other systems.  Studies Reviewed:    EKG:   NA  Risk Assessment/Calculations:    CHA2DS2-VASc Score = 3  {      Physical Exam:   VS:  BP 132/88 (BP Location: Left Arm, Patient Position: Sitting, Cuff Size: Normal)    Pulse (!) 55   Ht 5\' 11"  (1.803 m)   Wt 221 lb 9.6 oz (100.5 kg)   SpO2 94%   BMI 30.91 kg/m    Wt Readings from Last 3 Encounters:  09/26/23 221 lb 9.6 oz (100.5 kg)  09/13/23 219 lb 6.4 oz (99.5 kg)  08/22/23 215 lb 1.3 oz (97.6 kg)     GEN: Well nourished, well developed in no acute distress NECK: No JVD; No carotid bruits CARDIAC: Irregular RR, no murmurs, rubs, gallops RESPIRATORY:  Clear to auscultation without rales, wheezing or rhonchi  ABDOMEN: Soft, non-tender, non-distended EXTREMITIES:  No edema; No deformity   ASSESSMENT AND PLAN:   CHRONIC ATRIAL FIB:    As above ahead had a conversation with his oncologist and a message into his urologist to see if I can restart his Eliquis.  I am also going to refer him for consideration of a watchman.  I have seen Adam Harris is a 77 y.o. male in the office today who is being considered for a Watchman left atrial appendage closure device.  He has a history of permanent atrial fibrillation.  This patients CHA2DS2-VASc Score and unadjusted Ischemic Stroke Rate (% per year) is equal to 3.2 % stroke rate/year from a score of 3 which necessitates long term oral anticoagulation to prevent stroke.  Unfortunately,  He is not felt to be a long term Warfarin candidate secondary to GI bleeding.  The patients chart has been reviewed and I feel that they would be a candidate for short term oral anticoagulation.  Procedural risks for the Watchman implant have been reviewed with the patient including a 1% risk of stroke, 2% risk of perforation, 0.1% risk of device embolization.  Given the patient's poor candidacy for long-term oral anticoagulation and ability to tolerate short term oral anticoagulation I have recommended the watchman left atrial appendage closure system.  Note addendum:  After the patient left the office I was able to communicate with his urologist .  I have included this elsewhere as an encounter note.   " I think it is fine for him to be  anticoagulated.  He has had chronic BPH type bleeding and has bladder cancer but I scoped him yesterday and he has no obvious tumor recurrence at this time.  So, I think it is ok.  He will intermittently likely have bleeding and we are doing all we can to address that (finasteride and he is now getting systemic immunotherapy rather than intravesical treatments which should decrease his bladder irritation).  If the plan is to consider Watchman and get him off anticoagulation long term though, he may really benefit from that if it's an option for him."     We will resume the DOAC.     CAD:   The patient has no new sypmtoms.  No further cardiovascular testing is indicated.  We will continue with aggressive risk reduction and meds as listed.   HTN:  The blood pressure is at target.  No change in therapy.    CHRONIC DIASTOLIC HF:   He seems to be euvolemic.  No change in therapy.   DYSLIPIDEMIA:    LDL was 45 and HDL 32.  No change in therapy.   DM: His A1c is 6.7 which is down from 7.5 and he is having this managed by his primary provider.   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       Follow up with me in 3 months or sooner.   Signed, Rollene Rotunda, MD

## 2023-09-26 ENCOUNTER — Encounter: Payer: Self-pay | Admitting: Cardiology

## 2023-09-26 ENCOUNTER — Ambulatory Visit: Payer: Medicare PPO | Attending: Cardiology | Admitting: Cardiology

## 2023-09-26 VITALS — BP 132/88 | HR 55 | Ht 71.0 in | Wt 221.6 lb

## 2023-09-26 DIAGNOSIS — I5032 Chronic diastolic (congestive) heart failure: Secondary | ICD-10-CM | POA: Diagnosis not present

## 2023-09-26 DIAGNOSIS — I10 Essential (primary) hypertension: Secondary | ICD-10-CM

## 2023-09-26 DIAGNOSIS — I482 Chronic atrial fibrillation, unspecified: Secondary | ICD-10-CM

## 2023-09-26 DIAGNOSIS — R350 Frequency of micturition: Secondary | ICD-10-CM | POA: Diagnosis not present

## 2023-09-26 DIAGNOSIS — E785 Hyperlipidemia, unspecified: Secondary | ICD-10-CM

## 2023-09-26 DIAGNOSIS — I25119 Atherosclerotic heart disease of native coronary artery with unspecified angina pectoris: Secondary | ICD-10-CM | POA: Diagnosis not present

## 2023-09-26 DIAGNOSIS — C61 Malignant neoplasm of prostate: Secondary | ICD-10-CM | POA: Diagnosis not present

## 2023-09-26 DIAGNOSIS — N401 Enlarged prostate with lower urinary tract symptoms: Secondary | ICD-10-CM | POA: Diagnosis not present

## 2023-09-26 DIAGNOSIS — D090 Carcinoma in situ of bladder: Secondary | ICD-10-CM | POA: Diagnosis not present

## 2023-09-26 DIAGNOSIS — R310 Gross hematuria: Secondary | ICD-10-CM | POA: Diagnosis not present

## 2023-09-26 NOTE — Patient Instructions (Signed)
Medication Instructions:  No changes *If you need a refill on your cardiac medications before your next appointment, please call your pharmacy*  Follow-Up: At [facility], you and your health needs are our priority.  As part of our continuing mission to provide you with exceptional heart care, we have created designated Provider Care Teams.  These Care Teams include your primary Cardiologist (physician) and Advanced Practice Providers (APPs -  Physician Assistants and Nurse Practitioners) who all work together to provide you with the care you need, when you need it.  We recommend signing up for the patient portal called "MyChart".  Sign up information is provided on this After Visit Summary.  MyChart is used to connect with patients for Virtual Visits (Telemedicine).  Patients are able to view lab/test results, encounter notes, upcoming appointments, etc.  Non-urgent messages can be sent to your provider as well.   To learn more about what you can do with MyChart, go to [email or web address].    Your next appointment:   3 month(s)  Provider:   Rollene Rotunda, MD

## 2023-10-04 ENCOUNTER — Inpatient Hospital Stay: Payer: Medicare PPO | Attending: Hematology

## 2023-10-04 ENCOUNTER — Encounter: Payer: Self-pay | Admitting: Hematology

## 2023-10-04 ENCOUNTER — Inpatient Hospital Stay: Payer: Medicare PPO | Admitting: Hematology

## 2023-10-04 ENCOUNTER — Inpatient Hospital Stay: Payer: Medicare PPO

## 2023-10-04 NOTE — Assessment & Plan Note (Deleted)
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  He has had multiple BCG maintenance therapy, is BCG refractory. -Given his BCG refractory disease, recurrence in bladder and the urethra, I recommend systemic therapy with Keytruda every 3 weeks (or every 6 weeks if he tolerates well) for 2 years, per NCCN guideline.  He had no history of autoimmune disease, overall in good health, there is no contraindication for immunotherapy. --he started on 06/15/2023. C4 was postponed due to dyspnea and hypoxia on exertion, CT chest was negative for pneumonitis  -repeated cystoscopy and cytology was still positive in early Nov. I discussed with Dr. Laverle Patter and we decide continue Keytruda for additional 3 months and re-evaluate

## 2023-10-05 ENCOUNTER — Other Ambulatory Visit: Payer: Self-pay | Admitting: Nurse Practitioner

## 2023-10-05 ENCOUNTER — Telehealth: Payer: Self-pay

## 2023-10-05 DIAGNOSIS — R350 Frequency of micturition: Secondary | ICD-10-CM

## 2023-10-05 NOTE — Telephone Encounter (Signed)
Pt called regarding missed appts on 10/04/2023.  Pt stated he spoke with Sharlette Dense, CMA who informed pt that he Dr. Latanya Maudlin scheduler will contact him to get him rescheduled.  Stated that "Yes" Dr. Latanya Maudlin scheduler will be contacting him to get him rescheduled.  Pt verbalized understanding and had no further questions or concerns.

## 2023-10-08 ENCOUNTER — Other Ambulatory Visit: Payer: Self-pay | Admitting: Family Medicine

## 2023-10-08 ENCOUNTER — Encounter: Payer: Self-pay | Admitting: Hematology

## 2023-10-16 ENCOUNTER — Telehealth: Payer: Self-pay

## 2023-10-16 DIAGNOSIS — F5101 Primary insomnia: Secondary | ICD-10-CM

## 2023-10-16 MED ORDER — TRAZODONE HCL 50 MG PO TABS: 25.0000 mg | ORAL_TABLET | Freq: Every evening | ORAL | 3 refills | Status: DC | PRN

## 2023-10-16 NOTE — Telephone Encounter (Signed)
Refill requet for  Trazodone 50 mg  Rx d/c'ed by Dr Mosetta Putt on 06/08/23.    [facility]  Please review and advise.  Thanks.  Dm/cma

## 2023-10-18 ENCOUNTER — Encounter: Payer: Self-pay | Admitting: Hematology

## 2023-10-23 ENCOUNTER — Ambulatory Visit: Payer: Medicare PPO | Attending: Cardiology | Admitting: Cardiology

## 2023-10-23 ENCOUNTER — Encounter: Payer: Self-pay | Admitting: Cardiology

## 2023-10-23 VITALS — BP 124/86 | HR 69 | Ht 71.0 in | Wt 228.4 lb

## 2023-10-23 DIAGNOSIS — Z5112 Encounter for antineoplastic immunotherapy: Secondary | ICD-10-CM | POA: Insufficient documentation

## 2023-10-23 DIAGNOSIS — I4821 Permanent atrial fibrillation: Secondary | ICD-10-CM

## 2023-10-23 DIAGNOSIS — R310 Gross hematuria: Secondary | ICD-10-CM | POA: Diagnosis not present

## 2023-10-23 DIAGNOSIS — C680 Malignant neoplasm of urethra: Secondary | ICD-10-CM | POA: Insufficient documentation

## 2023-10-23 DIAGNOSIS — Z79899 Other long term (current) drug therapy: Secondary | ICD-10-CM | POA: Insufficient documentation

## 2023-10-23 DIAGNOSIS — D6869 Other thrombophilia: Secondary | ICD-10-CM

## 2023-10-23 DIAGNOSIS — I4819 Other persistent atrial fibrillation: Secondary | ICD-10-CM | POA: Insufficient documentation

## 2023-10-23 DIAGNOSIS — N184 Chronic kidney disease, stage 4 (severe): Secondary | ICD-10-CM | POA: Insufficient documentation

## 2023-10-23 MED ORDER — APIXABAN 2.5 MG PO TABS: 2.5000 mg | ORAL_TABLET | Freq: Two times a day (BID) | ORAL | 3 refills | Status: DC

## 2023-10-23 NOTE — Patient Instructions (Signed)
Medication Instructions:  Your physician has recommended you make the following change in your medication:  1) START taking Eliquis 2.5 mg twice daily  *If you need a refill on your cardiac medications before your next appointment, please call your pharmacy*  Testing/Procedures: Watchman Your physician has requested that you have Left atrial appendage (LAA) closure device implantation is a procedure to put a small device in the LAA of the heart. The LAA is a small sac in the wall of the heart's left upper chamber. Blood clots can form in this area. The device, Watchman closes the LAA to help prevent a blood clot and stroke.  If you decide to proceed with Watchman please call Nurse Navigator, [place] at [phone].    Follow-Up: At [facility], you and your health needs are our priority.  As part of our continuing mission to provide you with exceptional heart care, we have created designated Provider Care Teams.  These Care Teams include your primary Cardiologist (physician) and Advanced Practice Providers (APPs -  Physician Assistants and Nurse Practitioners) who all work together to provide you with the care you need, when you need it.

## 2023-10-23 NOTE — Progress Notes (Signed)
Electrophysiology Office Note:   Date:  10/23/2023  ID:  Adam Harris, DOB Feb 28, 1946, MRN [id number]  Primary Cardiologist: Rollene Rotunda, MD Electrophysiologist: Nobie Putnam, MD      History of Present Illness:   Adam Harris is a 77 y.o. 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 Antoine Poche.  Patient has recurrent urethral carcinoma. Oral anticoagulation was stopped because of this. He remains off daily oral anti-coagulation. He takes it occasionally in efforts to feel protected. He states that with full dose anti-coagulation he would have intermittent gross hematuria. Otherwise no new or acute complaints.   Review of systems complete and found to be negative unless listed in HPI.   EP Information / Studies Reviewed:    EKG is ordered today. EKG reviewed which showed atrial fibrillation.      Echo 06/12/2022: Normal LV size and function.  Moderate basal septal left ventricular hypertrophy.  LVEF 60 to 65%. Normal RV size and function. Mildly dilated left atrium.  Right atrium is normal in size. No significant valvular disease.  Risk Assessment/Calculations:    CHA2DS2-VASc Score = 6   This indicates a 9.7% annual risk of stroke. The patient's score is based upon: CHF History: 1 HTN History: 1 Diabetes History: 1 Stroke History: 0 Vascular Disease History: 1 Age Score: 2 Gender Score: 0             Physical Exam:   VS:  BP 124/86 (BP Location: Left Arm, Patient Position: Sitting, Cuff Size: Large)   Pulse 69   Ht 5\' 11"  (1.803 m)   Wt 228 lb 6.4 oz (103.6 kg)   SpO2 95%   BMI 31.86 kg/m    Wt Readings from  Last 3 Encounters:  10/23/23 228 lb 6.4 oz (103.6 kg)  09/26/23 221 lb 9.6 oz (100.5 kg)  09/13/23 219 lb 6.4 oz (99.5 kg)     GEN: Well nourished, well developed in no acute distress NECK: No JVD. CARDIAC: Normal rate, irregular. RESPIRATORY:  Clear to auscultation without rales, wheezing or rhonchi  ABDOMEN: Soft, non-tender, non-distended EXTREMITIES:  1+ edema; No deformity   ASSESSMENT AND PLAN:   Adam Harris is a 77 y.o. 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 Antoine Poche.  I have seen Adam Harris in the office today who is being considered for a Watchman left atrial appendage closure device. I believe they will benefit from this procedure given their history of atrial fibrillation, CHA2DS2-VASc score of 6 and unadjusted ischemic stroke rate of 9.7% per year. Unfortunately, the patient is not felt to be a long term anticoagulation candidate secondary to hematuria in the setting of urothelial cancer. The patient's chart has been reviewed and I feel that they would be a candidate for short term oral anticoagulation after Watchman implant.   It is my belief  that after undergoing a LAA closure procedure, Adam Harris will not need long term anticoagulation which eliminates anticoagulation side effects and major bleeding risk.   Procedural risks for the Watchman implant have been reviewed with the patient including a 0.5% risk of stroke, <1% risk of perforation and <1% risk of device embolization. Other risks include bleeding, vascular damage, tamponade, worsening renal function, and death. The patient understands these risk and wishes to proceed.     The published clinical  data on the safety and effectiveness of WATCHMAN include but are not limited to the following: - Holmes DR, Everlene Farrier, Sick P et al. for the PROTECT AF Investigators. Percutaneous closure of the left atrial appendage versus warfarin therapy for prevention of stroke in patients with atrial fibrillation: a randomised non-inferiority trial. Lancet 2009; 374: 534-42. Everlene Farrier, Doshi SK, Isa Rankin D et al. on behalf of the PROTECT AF Investigators. Percutaneous Left Atrial Appendage Closure for Stroke Prophylaxis in Patients With Atrial Fibrillation 2.3-Year Follow-up of the PROTECT AF (Watchman Left Atrial Appendage System for Embolic Protection in Patients With Atrial Fibrillation) Trial. Circulation 2013; 127:720-729. - Alli O, Doshi S,  Kar S, Reddy VY, Sievert H et al. Quality of Life Assessment in the Randomized PROTECT AF (Percutaneous Closure of the Left Atrial Appendage Versus Warfarin Therapy for Prevention of Stroke in Patients With Atrial Fibrillation) Trial of Patients at Risk for Stroke With Nonvalvular Atrial Fibrillation. J Am Coll Cardiol 2013; 61:1790-8. Aline August DR, [place], Price M, Whisenant B, Sievert H, Doshi S, Huber K, Reddy V. Prospective randomized evaluation of the Watchman left atrial appendage Device in patients with atrial fibrillation versus long-term warfarin therapy; the PREVAIL trial. Journal of the [employer] of Cardiology, Vol. 4, No. 1, 2014, 1-11. - Kar S, Doshi SK, Sadhu A, Horton R, Osorio J et al. Primary outcome evaluation of a next-generation left atrial appendage closure device: results from the PINNACLE FLX trial. Circulation 2021;143(18)1754-1762.   After today's visit with the patient which was dedicated solely for shared decision making visit regarding LAA closure device, the patient decided to proceed with the LAA appendage closure procedure scheduled to be done in the near future at [facility]. Prior to the procedure, I would like to obtain  a gated CT scan of the chest with contrast timed for PV/LA visualization.    ASSESSMENT AND PLAN: Permanent Atrial Fibrillation (ICD10:  I48.11) The patient's CHA2DS2-VASc score is 6, indicating a 9.7% annual risk of stroke.    Secondary Hypercoagulable State (ICD10:  D68.69) The patient is at significant risk for stroke/thromboembolism based upon his CHA2DS2-VASc Score of 6.  Patient states that he has not been told that anti-coagulation was  an absolute contraindication. He sometimes tolerates full dose Eliquis. We discussed starting Eliquis 2.5mg  BID with close monitoring for hematuria while planning/preparing for Watchman device implant. Given that he will need to be on anti-coagulation or DAPT for 45 days after implant.   Total time of encounter: 60 minutes total time of encounter, including chart review, face-to-face patient care, coordination of care and counseling regarding high complexity medical decision making.  Signed, Nobie Putnam, MD

## 2023-10-24 ENCOUNTER — Telehealth: Payer: Self-pay

## 2023-10-24 ENCOUNTER — Other Ambulatory Visit: Payer: Self-pay

## 2023-10-24 DIAGNOSIS — R310 Gross hematuria: Secondary | ICD-10-CM

## 2023-10-24 DIAGNOSIS — I4821 Permanent atrial fibrillation: Secondary | ICD-10-CM

## 2023-10-24 NOTE — Telephone Encounter (Signed)
The patient wishes to proceed with LAAO on 11/29/2023. He will call in the morning once he is home to arrange a pre-procedure visit. He was grateful for call and agreed with plan.

## 2023-10-24 NOTE — Telephone Encounter (Signed)
Confirmed with Dr. Jimmey Ralph the patient does NOT need CT scan prior to LAAO.  Called to arrange procedure. Left message to call back.

## 2023-10-24 NOTE — Telephone Encounter (Signed)
-----   Message from Nobie Putnam sent at 10/24/2023  2:23 PM EST ----- We can forego CT scan and just do TEE on the day of implant. ----- Message ----- From: Henrietta Dine, RN Sent: 10/24/2023  12:51 PM EST To: Nobie Putnam, MD  Hey Dr. Jimmey Ralph! You noted to get a cCT prior to LAAO. Carly didn't order one and his most recent creatinine is 2.37, so I wanted to double check if you want me to order/schedule pre-procedure CT or forgo it.  Thank you, KK

## 2023-10-25 ENCOUNTER — Encounter: Payer: Self-pay | Admitting: Hematology

## 2023-10-25 ENCOUNTER — Inpatient Hospital Stay: Payer: Medicare PPO | Attending: Hematology

## 2023-10-25 ENCOUNTER — Inpatient Hospital Stay: Payer: Medicare PPO

## 2023-10-25 ENCOUNTER — Inpatient Hospital Stay: Payer: Medicare PPO | Admitting: Hematology

## 2023-10-25 ENCOUNTER — Telehealth: Payer: Self-pay

## 2023-10-25 VITALS — BP 159/98 | HR 68 | Temp 97.2°F | Resp 19 | Wt 230.0 lb

## 2023-10-25 DIAGNOSIS — C679 Malignant neoplasm of bladder, unspecified: Secondary | ICD-10-CM

## 2023-10-25 DIAGNOSIS — I4819 Other persistent atrial fibrillation: Secondary | ICD-10-CM | POA: Diagnosis not present

## 2023-10-25 DIAGNOSIS — Z5112 Encounter for antineoplastic immunotherapy: Secondary | ICD-10-CM | POA: Insufficient documentation

## 2023-10-25 DIAGNOSIS — N184 Chronic kidney disease, stage 4 (severe): Secondary | ICD-10-CM | POA: Diagnosis not present

## 2023-10-25 DIAGNOSIS — Z79899 Other long term (current) drug therapy: Secondary | ICD-10-CM | POA: Diagnosis not present

## 2023-10-25 DIAGNOSIS — C689 Malignant neoplasm of urinary organ, unspecified: Secondary | ICD-10-CM

## 2023-10-25 DIAGNOSIS — C680 Malignant neoplasm of urethra: Secondary | ICD-10-CM | POA: Diagnosis not present

## 2023-10-25 LAB — CBC WITH DIFFERENTIAL (CANCER CENTER ONLY)
Abs Immature Granulocytes: 0.02 10*3/uL (ref 0.00–0.07)
Basophils Absolute: 0.1 10*3/uL (ref 0.0–0.1)
Basophils Relative: 2 %
Eosinophils Absolute: 0.1 10*3/uL (ref 0.0–0.5)
Eosinophils Relative: 2 %
HCT: 40.5 % (ref 39.0–52.0)
Hemoglobin: 13.8 g/dL (ref 13.0–17.0)
Immature Granulocytes: 0 %
Lymphocytes Relative: 14 %
Lymphs Abs: 1 10*3/uL (ref 0.7–4.0)
MCH: 30.2 pg (ref 26.0–34.0)
MCHC: 34.1 g/dL (ref 30.0–36.0)
MCV: 88.6 fL (ref 80.0–100.0)
Monocytes Absolute: 1 10*3/uL (ref 0.1–1.0)
Monocytes Relative: 14 %
Neutro Abs: 5.3 10*3/uL (ref 1.7–7.7)
Neutrophils Relative %: 68 %
Platelet Count: 151 10*3/uL (ref 150–400)
RBC: 4.57 MIL/uL (ref 4.22–5.81)
RDW: 15.8 % — ABNORMAL HIGH (ref 11.5–15.5)
WBC Count: 7.7 10*3/uL (ref 4.0–10.5)
nRBC: 0 % (ref 0.0–0.2)

## 2023-10-25 LAB — CMP (CANCER CENTER ONLY)
ALT: 9 U/L (ref 0–44)
AST: 16 U/L (ref 15–41)
Albumin: 3.8 g/dL (ref 3.5–5.0)
Alkaline Phosphatase: 96 U/L (ref 38–126)
Anion gap: 7 (ref 5–15)
BUN: 33 mg/dL — ABNORMAL HIGH (ref 8–23)
CO2: 22 mmol/L (ref 22–32)
Calcium: 9.1 mg/dL (ref 8.9–10.3)
Chloride: 112 mmol/L — ABNORMAL HIGH (ref 98–111)
Creatinine: 2.36 mg/dL — ABNORMAL HIGH (ref 0.61–1.24)
GFR, Estimated: 28 mL/min — ABNORMAL LOW (ref >60)
Glucose, Bld: 140 mg/dL — ABNORMAL HIGH (ref 70–99)
Potassium: 4 mmol/L (ref 3.5–5.1)
Sodium: 141 mmol/L (ref 135–145)
Total Bilirubin: 2.4 mg/dL — ABNORMAL HIGH (ref <1.2)
Total Protein: 7.1 g/dL (ref 6.5–8.1)

## 2023-10-25 MED ORDER — SODIUM CHLORIDE 0.9 % IV SOLN: Freq: Once | INTRAVENOUS | Status: AC

## 2023-10-25 MED ORDER — SODIUM CHLORIDE 0.9 % IV SOLN
200.0000 mg | Freq: Once | INTRAVENOUS | Status: AC
  Administered 2023-10-25: 200 mg via INTRAVENOUS
  Filled 2023-10-25: qty 8

## 2023-10-25 NOTE — Progress Notes (Signed)
[facility]   [phone] Fax:([phone]) [phone]   Clinic Follow up Note   Patient Care Team: Loyola Mast, MD as PCP - General (Family Medicine) Rollene Rotunda, MD as PCP - Cardiology (Cardiology) Nobie Putnam, MD as PCP - Electrophysiology (Cardiology) Heloise Purpura, MD as Consulting Physician (Urology) Rollene Rotunda, MD as Consulting Physician (Cardiology) Estanislado Emms, MD as Consulting Physician (Nephrology) Malachy Mood, MD as Consulting Physician (Hematology and Oncology) [facility]  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  He has had multiple BCG maintenance therapy, is BCG refractory. -Given his BCG refractory disease, recurrence in bladder and the urethra, I recommend systemic therapy with Keytruda every 3 weeks (or every 6 weeks if he tolerates well) for 2 years, per NCCN guideline.  He had no history of autoimmune disease, overall in good health, there is no contraindication for immunotherapy. --he started on 06/15/2023. C4 was postponed due to dyspnea and hypoxia on exertion, CT chest was negative for pneumonitis  -repeated cystoscopy and cytology was still positive in early Nov. I discussed with Dr. Laverle Patter and we decide continue Keytruda for additional 3 months and re-evaluate    Assessment and Plan    Bladder Cancer Bladder cancer treated with [facility] for three months. No significant issues reported. Next cystoscopy in  three months to assess response. Discussed potential bladder removal surgery if cancer persists, including risks such as the need for an ostomy bag. Patient expressed concerns about surgery. Cancer is superficial and unlikely to be life-threatening in the next few years but may become invasive if untreated. - Continue Keytruda for three more months - Schedule cystoscopy in three months - Discuss surgical options if cancer persists  Atrial Fibrillation On Eliquis for atrial fibrillation but experiencing hematuria. Discussed potential for Watchman device to reduce stroke risk, including procedural risks such as stroke. Patient scheduled to see a specialist for further evaluation and expressed concerns but is willing to proceed. - Proceed with Watchman device evaluation and potential procedure - Consult with cardiologist regarding anticoagulation management  Peripheral Edema and dyspnea on exertion  Significant fluid retention in legs causing difficulty with clothing and shortness of breath. Current treatment includes diuretics, but patient experiences frequent urination. Compression stockings and leg elevation recommended. Kidney function monitored due to diuretic use limitations. Cardiologist involved in management. - Continue diuretics as prescribed - Wear compression stockings - Elevate legs - Monitor kidney function regularly - Consult cardiologist for further management  CKD stage IV -Will monitor his kidney function closely  Plan -Lab reviewed, adequate for treatment, will proceed with Keytruda today and continue every 3 weeks - Schedule follow-up visit in three weeks.         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 Mild degradation secondary to lack of IV contrast and motion. 2. Similar to decrease in mild right-sided bladder wall thickening, nonspecific in the setting of prior bladder cancer. Most likely treatment related. 3. Borderline left external iliac and right inguinal adenopathy, decreased. This could represent response to therapy of metastasis or be reactive. 4. No evidence of abdominal metastasis or recurrent disease, status post right nephrectomy.   03/22/2023 Imaging   MR abdomen without contrast  IMPRESSION: 1. Mild degradation secondary to lack of IV contrast and motion. 2. Similar to decrease in mild right-sided bladder wall thickening, nonspecific in the setting of prior bladder cancer. Most likely treatment related. 3. Borderline left external iliac and right inguinal adenopathy, decreased. This could represent response to therapy of metastasis or be reactive. 4. No evidence of abdominal metastasis or recurrent disease, status post right nephrectomy.     06/08/2023 Cancer Staging   Staging form: Urinary Bladder, AJCC 8th Edition - Clinical stage from 06/08/2023: Stage 0is (cTis, cN0, cM0) - Signed by Malachy Mood, MD on 06/09/2023 WHO/ISUP grade (low/high): High Grade Histologic grading system: 2 grade system   07/31/2023 Imaging   CT chest without contrast   IMPRESSION: No findings suspicious for pneumonitis.  No evidence of metastatic disease.  Aortic Atherosclerosis (ICD10-I70.0).   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  History of Present Illness   The patient, a 77 year old male with a history of bladder cancer, presents for a follow-up visit. He has been on Keytruda for three months and is scheduled for another three months of treatment. The patient reports no significant side effects from the treatment. However, he expresses concern about the possibility of needing bladder removal surgery if the cancer does not respond to the medication. He is also worried about the implications of living with an ostomy bag post-surgery.  In addition to his bladder cancer, the patient has been experiencing fluid accumulation in his legs, which has led to a rapid weight gain. He reports that this fluid accumulation is causing shortness of breath and believes it may be contributing to his breathing issues. He is currently under the care of a cardiologist for his heart issues.  The patient also discusses his inability to tolerate Eliquis, a blood thinner, due to bleeding. His cardiologist has suggested the placement of a Watchman device as an alternative. The patient is considering this procedure but expresses concern about the potential risks, including the possibility of a stroke during the operation.         All other systems were reviewed with the patient and are negative.  MEDICAL HISTORY:  Past Medical History:  Diagnosis Date   Abnormal radiologic findings on diagnostic imaging of renal pelvis, ureter, or bladder    bilateral ureter abnormalities   Anticoagulant long-term use    eliquis   Anxiety    Arthritis    CAD (coronary  artery disease) cardiologist-- dr hochrein   NSTEMI 02-04-2014  per cardiac cath chronic occluded RCA w/ faint left-to-right collaterals and aneurysmal LCFx with sluggish coronary flow/   NSTEMI --11-21-2016 per cardiac cath occluded proximal RCA & mid to diastal  CFX 100%, med rx. If that does not work, PTCA or CABG   Cancer [facility])    bladder and kidney   CKD (chronic kidney disease), stage III (HCC)    patient unaware   DOE (dyspnea on exertion)    Fatty liver    pt denies   Hematuria 02/2019   History of COVID-19 10/2019   History of non-ST elevation myocardial infarction (NSTEMI)    02-04-2014  and 11-21-2016  cardiac cath done both times ,  medically management   History of shingles 12/2017   slight pain and numbness still noted in the area   Hyperlipidemia    Hypertension    Insomnia    Myocardial infarction [facility]) 2015   Persistent atrial fibrillation [facility])    cardiologsit-- dr hochrein   Thoracic aortic atherosclerosis (HCC)    Type 2 diabetes mellitus (HCC)    Urinary frequency     SURGICAL HISTORY: Past Surgical History:  Procedure Laterality Date   CARDIAC CATHETERIZATION N/A 11/21/2016   Procedure: Left Heart Cath and Coronary Angiography;  Surgeon: Lennette Bihari, MD;  Location: [facility] INVASIVE CV LAB;  Service: Cardiovascular;  Laterality: N/A;  pRCA 100% , ostial LAD 45%, OM3 80%, mCFX to dCFX 100% (AV groove), lateral OM3 50%   COLONOSCOPY     CYSTOSCOPY WITH BIOPSY N/A 08/12/2020   Procedure: CYSTOSCOPY WITH BLADDER BIOPSY AND TRANSURETHRAL RESECTION OF BLADDER TUMOR;  Surgeon: Heloise Purpura, MD;  Location: [facility] ORS;  Service: Urology;  Laterality: N/A;   CYSTOSCOPY WITH BIOPSY N/A 11/10/2021   Procedure: CYSTOSCOPY WITH BLADDER BIOPSIES/ LEFT RETROGRADE;  Surgeon: Heloise Purpura, MD;  Location: [facility];  Service: Urology;  Laterality: N/A;   CYSTOSCOPY WITH BIOPSY Left 07/27/2022   Procedure: CYSTOSCOPY WITH  BLADDER BIOPSY, TRANSURETHRAL FULGERATION OF BLADDER, EXAM UNDER ANESTHESIA, LEFT RETROGRADE PYELOGRAM;  Surgeon: Heloise Purpura, MD;  Location: [facility];  Service: Urology;  Laterality: Left;   CYSTOSCOPY WITH BIOPSY N/A 04/23/2023   Procedure: CYSTOSCOPY WITH BLADDER AND PROSTATIC URETHRAL BIOPSIES;  Surgeon: Heloise Purpura,  MD;  Location: [facility];  Service: Urology;  Laterality: N/A;  60 MINUTES NEEDED FOR CASE   CYSTOSCOPY WITH URETEROSCOPY AND STENT PLACEMENT Right 12/15/2019   Procedure: CYSTOSCOPY WITH RIGHT RETROGRADE/ RIGHT URETEROSCOPY/ BIOPSY;  Surgeon: Ihor Gully, MD;  Location: [facility];  Service: Urology;  Laterality: Right;   CYSTOSCOPY/RETROGRADE/URETEROSCOPY Bilateral 03/18/2018   Procedure: CYSTOSCOPY/RETROGRADE/URETEROSCOPY.;  Surgeon: Ihor Gully, MD;  Location: [facility];  Service: Urology;  Laterality: Bilateral;   EYE SURGERY Bilateral    cateract in January 2023   HYDROCELE EXCISION Left 07/27/2022   Procedure: HYDROCELE REPAIR;  Surgeon: Heloise Purpura, MD;  Location: [facility];  Service: Urology;  Laterality: Left;  GENERAL ANESTHESIA WITH PARALYSIS   KNEE ARTHROSCOPY Right    LEFT HEART CATHETERIZATION WITH CORONARY ANGIOGRAM N/A 02/04/2014   Procedure: LEFT HEART CATHETERIZATION WITH CORONARY ANGIOGRAM;  Surgeon: Iran Ouch, MD;  Location: [facility] CATH LAB;  Service: Cardiovascular;  Laterality: N/A;  severe one-vessel CAD, chronically occluded RCA with faint left-to-right collaterals;  aneurysmal LCFx with sluggish coronary flow;  normal LVSF w/ moderately elevated LVEDP (ostialOM2 20%, pOM3 20%, pD1 20%, mCFX 50%, diffuse 20% pCFX)   PROSTATE BIOPSY N/A 08/12/2020  Procedure: BIOPSY TRANSRECTAL ULTRASONIC PROSTATE (TUBP);  Surgeon: Heloise Purpura, MD;  Location: [facility];  Service: Urology;  Laterality: N/A;   ROBOT ASSITED LAPAROSCOPIC NEPHROURETERECTOMY Right 03/22/2020   Procedure: XI ROBOT ASSITED LAPAROSCOPIC NEPHROURETERECTOMY;  Surgeon: Heloise Purpura, MD;  Location: [facility];  Service: Urology;  Laterality: Right;   TRANSTHORACIC ECHOCARDIOGRAM  11-22-2016   dr hochrein   ef 55-60%/ mild MR and TR/ moderate LAE   TRANSURETHRAL RESECTION OF BLADDER TUMOR N/A 02/10/2021   Procedure: TRANSURETHRAL RESECTION OF BLADDER TUMOR (TURBT)/ CYSTOSCOPY/ LEFT  RETROGRADE;  Surgeon: Heloise Purpura, MD;  Location: [facility];  Service: Urology;  Laterality: N/A;  GENERAL ANESTHESIA WITH PARALYSIS    I have reviewed the social history and family history with the patient and they are unchanged from previous note.  ALLERGIES:  is allergic to xarelto [rivaroxaban].  MEDICATIONS:  Current Outpatient Medications  Medication Sig Dispense Refill   amLODipine (NORVASC) 5 MG tablet Take 1 tablet (5 mg total) by mouth daily. 90 tablet 3   apixaban (ELIQUIS) 2.5 MG TABS tablet Take 1 tablet (2.5 mg total) by mouth 2 (two) times daily. 180 tablet 3   FARXIGA 10 MG TABS tablet Take 10 mg by mouth daily. (Patient not taking: Reported on 10/23/2023)     finasteride (PROSCAR) 5 MG tablet Take 5 mg by mouth daily.     furosemide (LASIX) 20 MG tablet Take 1 tablet (20 mg total) by mouth daily. 90 tablet 3   hydrOXYzine (VISTARIL) 25 MG capsule Take 1 capsule (25 mg total) by mouth every 8 (eight) hours as needed. 30 capsule 2   isosorbide mononitrate (IMDUR) 30 MG 24 hr tablet TAKE ONE TABLET BY MOUTH DAILY 90 tablet 3   metoprolol tartrate (LOPRESSOR) 25 MG tablet TAKE ONE TABLET BY MOUTH TWICE A DAY 180 tablet 2   mupirocin cream (BACTROBAN) 2 % Apply 1 Application topically 2 (two) times daily. (Patient taking differently: Apply 1 Application topically as needed.) 15 g 0   nitroGLYCERIN (NITROSTAT) 0.4 MG SL tablet TAKE ONE TABLET UNDER THE TONGUE EVERY FIVE MINUTES FOR THREE DOSES AS NEEDED FOR CHEST PAIN, CALL 911 IF 2ND DOSE DOESN'T HELP 25 tablet 4   olmesartan (BENICAR) 40 MG tablet Take 40 mg by mouth daily.     oxybutynin (DITROPAN-XL) 5 MG 24 hr tablet TAKE 1 TABLET (5 MG TOTAL) BY MOUTH AT BEDTIME. 30 tablet 1   Pembrolizumab (KEYTRUDA IV) Inject into the vein.     rosuvastatin (CRESTOR) 40 MG tablet TAKE 1 TABLET (40 MG TOTAL) BY MOUTH EVERY MORNING. PLEASE KEEP SCHEDULED APPOINTMENT 90 tablet 3   sitaGLIPtin (JANUVIA) 25 MG tablet TAKE ONE TABLET BY MOUTH  DAILY 60 tablet 1   sodium bicarbonate 650 MG tablet Take 650 mg by mouth 2 (two) times daily.     tamsulosin (FLOMAX) 0.4 MG CAPS capsule Take 1 capsule (0.4 mg total) by mouth daily. 30 capsule 3   traZODone (DESYREL) 50 MG tablet Take 0.5-1 tablets (25-50 mg total) by mouth at bedtime as needed for sleep. 30 tablet 3   No current facility-administered medications for this visit.    PHYSICAL EXAMINATION: ECOG PERFORMANCE STATUS: 2 - Symptomatic, <50% confined to bed  Vitals:   10/25/23 1445  BP: (!) 159/98  Pulse: 68  Resp: 19  Temp: (!) 97.2 F (36.2 C)  SpO2: 100%   Wt Readings from Last 3 Encounters:  10/25/23 230 lb (104.3 kg)  10/23/23 228 lb 6.4 oz (103.6 kg)  09/26/23 221 lb 9.6 oz (100.5 kg)     GENERAL:alert, no distress and comfortable SKIN: skin color, texture, turgor are normal, no rashes or significant lesions EYES: normal, Conjunctiva are pink and non-injected, sclera clear NECK: supple, thyroid normal size, non-tender, without nodularity LYMPH:  no palpable lymphadenopathy in the cervical, axillary  LUNGS: clear to auscultation and percussion with normal breathing effort HEART: regular rate & rhythm and no murmurs and no lower extremity edema ABDOMEN:abdomen soft, non-tender and normal bowel sounds Musculoskeletal:no cyanosis of digits and no clubbing, (+) bilateral leg edema up to knee NEURO: alert & oriented x 3 with fluent speech, no focal motor/sensory deficits    LABORATORY DATA:  I have reviewed the data as listed    Latest Ref Rng & Units 10/25/2023    1:57 PM 09/13/2023    1:15 PM 08/22/2023    8:24 AM  CBC  WBC 4.0 - 10.5 K/uL 7.7  9.4  8.4   Hemoglobin 13.0 - 17.0 g/dL 53.6  64.4  03.4   Hematocrit 39.0 - 52.0 % 40.5  42.7  45.5   Platelets 150 - 400 K/uL 151  174  161         Latest Ref Rng & Units 10/25/2023    1:57 PM 09/13/2023    1:15 PM 08/22/2023    8:24 AM  CMP  Glucose 70 - 99 mg/dL 742  595  638   BUN 8 - 23 mg/dL 33  30   34   Creatinine 0.61 - 1.24 mg/dL 7.56  4.33  2.95   Sodium 135 - 145 mmol/L 141  140  141   Potassium 3.5 - 5.1 mmol/L 4.0  4.1  3.8   Chloride 98 - 111 mmol/L 112  112  112   CO2 22 - 32 mmol/L 22  24  21    Calcium 8.9 - 10.3 mg/dL 9.1  9.3  9.4   Total Protein 6.5 - 8.1 g/dL 7.1  7.1  7.4   Total Bilirubin <1.2 mg/dL 2.4  2.0  2.2   Alkaline Phos 38 - 126 U/L 96  100  86   AST 15 - 41 U/L 16  19  13    ALT 0 - 44 U/L 9  12  8        RADIOGRAPHIC STUDIES: I have personally reviewed the radiological images as listed and agreed with the findings in the report. No results found.    Orders Placed This Encounter  Procedures   CBC with Differential (Cancer Center Only)    Standing Status:   Future    Expected Date:   12/27/2023    Expiration Date:   12/26/2024   CMP (Cancer Center only)    Standing Status:   Future    Expected Date:   12/27/2023    Expiration Date:   12/26/2024   T4    Standing Status:   Future    Expected Date:   12/27/2023    Expiration Date:   12/26/2024   TSH    Standing Status:   Future    Expected Date:   12/27/2023    Expiration Date:   12/26/2024   CBC with Differential (Cancer Center Only)    Standing Status:   Future    Expected Date:   01/17/2024    Expiration Date:   01/16/2025   CMP (Cancer Center only)    Standing Status:   Future    Expected Date:   01/17/2024  Expiration Date:   01/16/2025   All questions were answered. The patient knows to call the clinic with any problems, questions or concerns. No barriers to learning was detected. The total time spent in the appointment was 25 minutes.     Malachy Mood, MD 10/25/2023

## 2023-10-25 NOTE — Patient Instructions (Signed)

## 2023-10-25 NOTE — Telephone Encounter (Signed)
Scheduled the patient for pre-procedure visit 11/05/2023. He was grateful for call and agreed with plan.

## 2023-10-25 NOTE — Assessment & Plan Note (Signed)
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  He has had multiple BCG maintenance therapy, is BCG refractory. -Given his BCG refractory disease, recurrence in bladder and the urethra, I recommend systemic therapy with Keytruda every 3 weeks (or every 6 weeks if he tolerates well) for 2 years, per NCCN guideline.  He had no history of autoimmune disease, overall in good health, there is no contraindication for immunotherapy. --he started on 06/15/2023. C4 was postponed due to dyspnea and hypoxia on exertion, CT chest was negative for pneumonitis  -repeated cystoscopy and cytology was still positive in early Nov. I discussed with Dr. Laverle Patter and we decide continue Keytruda for additional 3 months and re-evaluate

## 2023-10-26 ENCOUNTER — Encounter: Payer: Self-pay | Admitting: Hematology

## 2023-11-05 ENCOUNTER — Ambulatory Visit: Payer: Medicare PPO | Attending: Cardiology | Admitting: Cardiology

## 2023-11-05 VITALS — BP 134/60 | HR 62 | Ht 71.0 in | Wt 227.8 lb

## 2023-11-05 DIAGNOSIS — I25119 Atherosclerotic heart disease of native coronary artery with unspecified angina pectoris: Secondary | ICD-10-CM

## 2023-11-05 DIAGNOSIS — E1159 Type 2 diabetes mellitus with other circulatory complications: Secondary | ICD-10-CM

## 2023-11-05 DIAGNOSIS — R310 Gross hematuria: Secondary | ICD-10-CM | POA: Diagnosis not present

## 2023-11-05 DIAGNOSIS — D6869 Other thrombophilia: Secondary | ICD-10-CM | POA: Diagnosis not present

## 2023-11-05 DIAGNOSIS — I10 Essential (primary) hypertension: Secondary | ICD-10-CM | POA: Diagnosis not present

## 2023-11-05 DIAGNOSIS — I4821 Permanent atrial fibrillation: Secondary | ICD-10-CM | POA: Diagnosis not present

## 2023-11-05 DIAGNOSIS — I5032 Chronic diastolic (congestive) heart failure: Secondary | ICD-10-CM | POA: Diagnosis not present

## 2023-11-05 MED ORDER — CLOPIDOGREL BISULFATE 75 MG PO TABS: 75.0000 mg | ORAL_TABLET | Freq: Every day | ORAL | 3 refills | Status: DC

## 2023-11-05 MED ORDER — ASPIRIN 81 MG PO TBEC: 81.0000 mg | DELAYED_RELEASE_TABLET | Freq: Every day | ORAL | Status: DC

## 2023-11-05 NOTE — Patient Instructions (Addendum)
Medication Instructions:  Your physician has recommended you make the following change in your medication:  STOP ELIQUIS  START ASPIRIN 81 MG AND PLAVIX 75 MG ON 11/22/23 *If you need a refill on your cardiac medications before your next appointment, please call your pharmacy*   Lab Work: TODAY: BMET, CBC  If you have labs (blood work) drawn today and your tests are completely normal, you will receive your results only by: MyChart Message (if you have MyChart) OR A paper copy in the mail If you have any lab test that is abnormal or we need to change your treatment, we will call you to review the results.   Testing/Procedures: SEE INSTRUCTION LETTER    Follow-Up: At [facility], you and your health needs are our priority.  As part of our continuing mission to provide you with exceptional heart care, we have created designated Provider Care Teams.  These Care Teams include your primary Cardiologist (physician) and Advanced Practice Providers (APPs -  Physician Assistants and Nurse Practitioners) who all work together to provide you with the care you need, when you need it.  We recommend signing up for the patient portal called "MyChart".  Sign up information is provided on this After Visit Summary.  MyChart is used to connect with patients for Virtual Visits (Telemedicine).  Patients are able to view lab/test results, encounter notes, upcoming appointments, etc.  Non-urgent messages can be sent to your provider as well.   To learn more about what you can do with MyChart, go to [email or web address].    Your next appointment:   KEEP SCHEDULED FOLLOW-UP

## 2023-11-05 NOTE — Progress Notes (Signed)
[facility]                                     Cardiology Office Note:    Date:  11/05/2023   ID:  Adam Harris, DOB 1946/05/06, MRN [id number]  PCP:  Loyola Mast, MD  [facility] Cardiologist:  Rollene Rotunda, MD  [facility] Electrophysiologist:  Nobie Putnam, MD   Referring MD: Loyola Mast, MD   Chief Complaint  Patient presents with   pre LAAO    History of Present Illness:    Adam Harris is a 77 y.o. 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   Adam Harris is followed by Dr. Antoine Poche for his cardiology care. He was recently seen last month and was noted to have stopped his Eliquis due to hematuria. He follows with urology and oncology due to Dx of urethral carcinoma in 2021 with recent notes indicating possible need to repeat cystoscopy with mention of potential palliative resection. He saw Dr. Mosetta Putt with oncology 12/12 and was cleared to proceed with LAAO.   He saw Dr. Jimmey Ralph 12/10 with recommendation to start low dose Eliquis 2.5mg  BID and proceed with implant. Today he comes for pre-operative visit and reports that he started Eliquis and has had ongoing hematuria with large clots since that time. Recommended that he stop Eliquis. Dr. Jimmey Ralph in the office and confirmed plan to start ASA and Plavix one week prior to [facility] and continue for 6 months. Otherwise the patient has no chest pain, SOB, palpitations, LE edema, orthopnea, PND, dizziness, or syncope.   Past Medical History:  Diagnosis Date   Abnormal radiologic findings on diagnostic imaging of renal pelvis, ureter, or bladder    bilateral ureter abnormalities   Anticoagulant long-term use    eliquis   Anxiety    Arthritis    CAD (coronary artery disease) cardiologist-- dr hochrein   NSTEMI 02-04-2014  per cardiac cath chronic occluded RCA w/ faint  left-to-right collaterals and aneurysmal LCFx with sluggish coronary flow/   NSTEMI --11-21-2016 per cardiac cath occluded proximal RCA & mid to diastal CFX 100%, med rx. If that does not work, PTCA or CABG   Cancer [name])    bladder and kidney   CKD (chronic kidney disease), stage III (HCC)    patient unaware   DOE (dyspnea on exertion)    Fatty liver    pt denies   Hematuria 02/2019   History of COVID-19 10/2019   History of non-ST elevation myocardial infarction (NSTEMI)    02-04-2014  and 11-21-2016  cardiac cath done both times ,  medically management   History of shingles 12/2017   slight pain and numbness still noted in the area   Hyperlipidemia    Hypertension    Insomnia    Myocardial infarction [name]) 2015   Persistent atrial fibrillation [facility])    cardiologsit-- dr hochrein   Thoracic aortic atherosclerosis (HCC)    Type 2 diabetes mellitus (HCC)    Urinary frequency     Past Surgical History:  Procedure Laterality Date   CARDIAC CATHETERIZATION N/A 11/21/2016   Procedure: Left Heart Cath and Coronary Angiography;  Surgeon: Lennette Bihari, MD;  Location: [facility] INVASIVE CV LAB;  Service: Cardiovascular;  Laterality: N/A;  pRCA 100% , ostial LAD 45%, OM3 80%, mCFX to dCFX 100% (AV groove), lateral OM3 50%   COLONOSCOPY     CYSTOSCOPY WITH BIOPSY N/A 08/12/2020   Procedure: CYSTOSCOPY WITH BLADDER BIOPSY AND TRANSURETHRAL RESECTION OF BLADDER TUMOR;  Surgeon: Heloise Purpura, MD;  Location: [facility] ORS;  Service: Urology;  Laterality: N/A;   CYSTOSCOPY WITH BIOPSY N/A 11/10/2021   Procedure: CYSTOSCOPY WITH BLADDER BIOPSIES/ LEFT RETROGRADE;  Surgeon: Heloise Purpura, MD;  Location: [facility];  Service: Urology;  Laterality: N/A;   CYSTOSCOPY WITH BIOPSY Left 07/27/2022   Procedure: CYSTOSCOPY WITH  BLADDER BIOPSY, TRANSURETHRAL FULGERATION OF BLADDER, EXAM UNDER ANESTHESIA, LEFT RETROGRADE PYELOGRAM;  Surgeon: Heloise Purpura, MD;  Location: [facility];  Service: Urology;  Laterality: Left;    CYSTOSCOPY WITH BIOPSY N/A 04/23/2023   Procedure: CYSTOSCOPY WITH BLADDER AND PROSTATIC URETHRAL BIOPSIES;  Surgeon: Heloise Purpura, MD;  Location: [facility];  Service: Urology;  Laterality: N/A;  60 MINUTES NEEDED FOR CASE   CYSTOSCOPY WITH URETEROSCOPY AND STENT PLACEMENT Right 12/15/2019   Procedure: CYSTOSCOPY WITH RIGHT RETROGRADE/ RIGHT URETEROSCOPY/ BIOPSY;  Surgeon: Ihor Gully, MD;  Location: [facility];  Service: Urology;  Laterality: Right;   CYSTOSCOPY/RETROGRADE/URETEROSCOPY Bilateral 03/18/2018   Procedure: CYSTOSCOPY/RETROGRADE/URETEROSCOPY.;  Surgeon: Ihor Gully, MD;  Location[facility];  Service: Urology;  Laterality: Bilateral;   EYE SURGERY Bilateral    cateract in January 2023   HYDROCELE EXCISION Left 07/27/2022   Procedure: HYDROCELE REPAIR;  Surgeon: Heloise Purpura, MD;  Location: [facility];  Service: Urology;  Laterality: Left;  GENERAL ANESTHESIA WITH PARALYSIS   KNEE ARTHROSCOPY Right    LEFT HEART CATHETERIZATION WITH CORONARY ANGIOGRAM N/A 02/04/2014   Procedure: LEFT HEART CATHETERIZATION WITH CORONARY ANGIOGRAM;  Surgeon: Iran Ouch, MD;  Location: [facility] CATH LAB;  Service: Cardiovascular;  Laterality: N/A;  severe one-vessel CAD, chronically occluded RCA with faint left-to-right collaterals;  aneurysmal LCFx with sluggish coronary flow;  normal LVSF w/ moderately elevated LVEDP (ostialOM2 20%, pOM3 20%, pD1 20%, mCFX 50%, diffuse 20% pCFX)   PROSTATE BIOPSY N/A 08/12/2020   Procedure: BIOPSY TRANSRECTAL ULTRASONIC PROSTATE (TUBP);  Surgeon: Heloise Purpura, MD;  Location: [facility];  Service: Urology;  Laterality: N/A;   ROBOT ASSITED LAPAROSCOPIC NEPHROURETERECTOMY Right 03/22/2020   Procedure: XI ROBOT ASSITED LAPAROSCOPIC NEPHROURETERECTOMY;  Surgeon: Heloise Purpura, MD;  Location: [facility];  Service: Urology;  Laterality: Right;   TRANSTHORACIC ECHOCARDIOGRAM  11-22-2016   dr hochrein   ef 55-60%/ mild MR and TR/ moderate LAE    TRANSURETHRAL RESECTION OF BLADDER TUMOR N/A 02/10/2021   Procedure: TRANSURETHRAL RESECTION OF BLADDER TUMOR (TURBT)/ CYSTOSCOPY/ LEFT RETROGRADE;  Surgeon: Heloise Purpura, MD;  Location: [facility];  Service: Urology;  Laterality: N/A;  GENERAL ANESTHESIA WITH PARALYSIS    Current Medications: Current Meds  Medication Sig   amLODipine (NORVASC) 5 MG tablet Take 1 tablet (5 mg total) by mouth daily.   aspirin EC 81 MG tablet Take 1 tablet (81 mg total) by mouth daily. Swallow whole.   clopidogrel (PLAVIX) 75 MG tablet Take 1 tablet (75 mg total) by mouth daily.   finasteride (PROSCAR) 5 MG tablet Take 5 mg by mouth daily.   furosemide (LASIX) 20 MG tablet Take 1 tablet (20 mg total) by mouth daily.   hydrOXYzine (VISTARIL) 25 MG capsule Take 1 capsule (25 mg total) by mouth every 8 (eight) hours as needed.   isosorbide mononitrate (IMDUR) 30 MG 24 hr tablet TAKE ONE TABLET BY MOUTH DAILY  metoprolol tartrate (LOPRESSOR) 25 MG tablet TAKE ONE TABLET BY MOUTH TWICE A DAY   nitroGLYCERIN (NITROSTAT) 0.4 MG SL tablet TAKE ONE TABLET UNDER THE TONGUE EVERY FIVE MINUTES FOR THREE DOSES AS NEEDED FOR CHEST PAIN, CALL 911 IF 2ND DOSE DOESN'T HELP   olmesartan (BENICAR) 40 MG tablet Take 40 mg by mouth daily.   oxybutynin (DITROPAN-XL) 5 MG 24 hr tablet TAKE 1 TABLET (5 MG TOTAL) BY MOUTH AT BEDTIME.   Pembrolizumab (KEYTRUDA IV) Inject into the vein.   rosuvastatin (CRESTOR) 40 MG tablet TAKE 1 TABLET (40 MG TOTAL) BY MOUTH EVERY MORNING. PLEASE KEEP SCHEDULED APPOINTMENT   sitaGLIPtin (JANUVIA) 25 MG tablet TAKE ONE TABLET BY MOUTH DAILY   sodium bicarbonate 650 MG tablet Take 650 mg by mouth 2 (two) times daily.   tamsulosin (FLOMAX) 0.4 MG CAPS capsule Take 1 capsule (0.4 mg total) by mouth daily.   traZODone (DESYREL) 50 MG tablet Take 0.5-1 tablets (25-50 mg total) by mouth at bedtime as needed for sleep.     Allergies:   Xarelto [rivaroxaban]   Social History   Socioeconomic History    Marital status: Married    Spouse name: Darl Pikes   Number of children: 3   Years of education: Not on file   Highest education level: Master's degree (e.g., MA, MS, MEng, MEd, MSW, MBA)  Occupational History   Occupation: Retired- Psychologist, occupational    Comment: [employer]  Tobacco Use   Smoking status: Former    Current packs/day: 0.00    Average packs/day: 0.3 packs/day for 4.0 years (1.0 ttl pk-yrs)    Types: Cigarettes    Start date: 1968    Quit date: 1972    Years since quitting: 53.0   Smokeless tobacco: Former    Types: Chew    Quit date: 1972  Vaping Use   Vaping status: Never Used  Substance and Sexual Activity   Alcohol use: No   Drug use: No   Sexual activity: Yes  Other Topics Concern   Not on file  Social History Narrative   Not on file   Social Drivers of Health   Financial Resource Strain: Low Risk  (08/06/2023)   Overall Financial Resource Strain (CARDIA)    Difficulty of Paying Living Expenses: Not hard at all  Food Insecurity: No Food Insecurity (08/06/2023)   Hunger Vital Sign    Worried About Running Out of Food in the Last Year: Never true    Ran Out of Food in the Last Year: Never true  Transportation Needs: No Transportation Needs (08/06/2023)   PRAPARE - Administrator, Civil Service (Medical): No    Lack of Transportation (Non-Medical): No  Physical Activity: Inactive (08/06/2023)   Exercise Vital Sign    Days of Exercise per Week: 0 days    Minutes of Exercise per Session: 0 min  Stress: No Stress Concern Present (08/06/2023)   [employer] of Occupational Health - Occupational Stress Questionnaire    Feeling of Stress : Not at all  Social Connections: Moderately Integrated (08/06/2023)   Social Connection and Isolation Panel [NHANES]    Frequency of Communication with Friends and Family: More than three times a week    Frequency of Social Gatherings with Friends and Family: More than three times a week    Attends Religious Services:  More than 4 times per year    Active Member of [employer] or Organizations: No    Attends Banker Meetings: Never  Marital Status: Married    Family History: The patient's family history includes Cancer in his mother; Hypertension in his mother. There is no history of Esophageal cancer, Pancreatic cancer, Stomach cancer, or Liver disease.  ROS:   Please see the history of present illness.    All other systems reviewed and are negative.  EKGs/Labs/Other Studies Reviewed:    The following studies were reviewed today:  Cardiac Studies & Procedures   CARDIAC CATHETERIZATION  CARDIAC CATHETERIZATION 11/21/2016  Narrative  Prox RCA lesion, 100 %stenosed.  Ost LAD lesion, 45 %stenosed.  3rd Mrg lesion, 80 %stenosed.  Mid Cx to Dist Cx lesion, 100 %stenosed.  Lat 3rd Mrg lesion, 50 %stenosed.  Multivessel CAD: The LAD had ostial 40 to less than 50% focal stenosis.  There were mild luminal irregularities in the LAD, which extended to the apex but otherwise was without additional significant obstructive disease.  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  The OM1 vessel had 80% stenosis before giving rise to a branch and then had 50% stenosis in the inferior limb.  The AV groove circumflex was 99/100% occluded immediately after the large aneurysmal segment and  there was faint filling of a very large an additional aneurysmal segment with thrombus and very faint filling of the distal posterolateral branches.  There was faint collateralization of the distal posterolateral branches via the inferior branch of the obtuse marginal vessel.  The RCA was totally occluded proximally as had been previously demonstrated and there were faint bridging collaterals supplying the mid RCA which was a nondominant vessel  distally.  LVEDP 18 mm Hg.  RECOMMENDATION: Angiographic findings were reviewed with Dr. Katrinka Blazing.  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  His RCA was totally occluded at that time.  His RCA is essentially unchanged.  His AV groove circumflex is now occluded, which most likely is the culprit lesion.  The patient had been inadequately been taking his anticoagulation.  Heparin will be resumed tonight with plans to resume oral anticoagulation.  Initially, the patient will be treated with increasing medical therapy.  He is completely pain-free.  He will be started on nitrates, in addition to increased beta blocker therapy.  He is not a candidate for stenting due to the significant aneurysmal dilatations throughout his vessels.  If he develops increasing symptomatology, consider possible PTCA and/or evaluation for surgical revascularization.  Findings Coronary Findings Diagnostic  Dominance: Left  Left Main The left main immediately bifurcated into an LAD and a dominant left circumflex vessel.  Left Anterior Descending The LAD had ostial 40 to less than 50% focal stenosis.  There were mild luminal irregularities in the LAD, which extended to the apex but otherwise was without additional significant obstructive disease  Left Circumflex The left circumflex vessel was a large dominant vessel that had a large region of aneurysmal dilation in the proximal to mid segment prior to bifurcating into a bifurcating marginal branch and the more distal AV groove circumflex.  The OM1 vessel had 80% stenosis before giving rise to a branch and then had 50% stenosis in the inferior limb.  The AV groove circumflex was 99/100% occluded immediately after the large aneurysmal segment and then there was faint filling of a very  large an additional aneurysmal segment with thrombus and very faint filling of the distal  posterolateral branches.  There was faint collateralization of the distal posterolateral branches via the inferior branch of the obtuse marginal vessel.  Third Obtuse Marginal Branch  Lateral Third Obtuse Marginal Branch  Right Coronary Artery The RCA was totally occluded proximally as had been previously demonstrated and there were faint bridging collaterals supplying the mid RCA which was a nondominant vessel distally.  Acute Marginal Branch Collaterals Acute Mrg filled by collaterals from Ost RCA.  Intervention  No interventions have been documented.    ECHOCARDIOGRAM  ECHOCARDIOGRAM COMPLETE 06/12/2022  Narrative ECHOCARDIOGRAM REPORT    Patient Name:   Luz Eatherly     Date of Exam: 06/12/2022 Medical Rec #:  [id number]     Height:       71.0 in Accession #:    [id number]    Weight:       224.0 lb Date of Birth:  05-13-1946      BSA:          2.213 m Patient Age:    76 years      BP:           114/78 mmHg Patient Gender: M             HR:           67 bpm. Exam Location:  [address]  Procedure: 2D Echo, Cardiac Doppler and Color Doppler  Indications:    R60.0 Lower extremity swelling  History:        Patient has prior history of Echocardiogram examinations, most recent 11/22/2016. CHF, Previous Myocardial Infarction and CAD, Arrythmias:Atrial Fibrillation; Risk Factors:Hypertension, Diabetes and Dyslipidemia. Exertional angina.  Sonographer:    Cathie Beams RCS Referring Phys: Rollene Rotunda  IMPRESSIONS   1. Left ventricular ejection fraction, by estimation, is 60 to 65%. The left ventricle has normal function. The left ventricle has no regional wall motion abnormalities. There is moderate left ventricular hypertrophy of the basal-septal segment. Left ventricular diastolic parameters are indeterminate. 2. Right ventricular systolic function is normal. The right ventricular size is normal. There is mildly elevated pulmonary artery systolic pressure. 3. Left  atrial size was mildly dilated. 4. The mitral valve is normal in structure. Mild mitral valve regurgitation. No evidence of mitral stenosis. 5. The aortic valve is tricuspid. There is mild calcification of the aortic valve. Aortic valve regurgitation is not visualized. Aortic valve sclerosis is present, with no evidence of aortic valve stenosis. 6. The inferior vena cava is normal in size with greater than 50% respiratory variability, suggesting right atrial pressure of 3 mmHg.  FINDINGS Left Ventricle: Left ventricular ejection fraction, by estimation, is 60 to 65%. The left ventricle has normal function. The left ventricle has no regional wall motion abnormalities. The left ventricular internal cavity size was normal in size. There is moderate left ventricular hypertrophy of the basal-septal segment. Left ventricular diastolic parameters are indeterminate.  Right Ventricle: The right ventricular size is normal. No increase in right ventricular wall thickness. Right ventricular systolic function is normal. There is mildly elevated pulmonary artery systolic pressure. The tricuspid regurgitant velocity is 3.18 m/s, and with an assumed right atrial pressure of 3 mmHg, the estimated right ventricular systolic pressure is 43.4 mmHg.  Left Atrium: Left atrial size was mildly dilated.  Right Atrium: Right atrial size was normal in size.  Pericardium: There is no evidence of pericardial effusion.  Mitral Valve: The mitral valve is  normal in structure. Mild mitral valve regurgitation. No evidence of mitral valve stenosis.  Tricuspid Valve: The tricuspid valve is normal in structure. Tricuspid valve regurgitation is mild . No evidence of tricuspid stenosis.  Aortic Valve: The aortic valve is tricuspid. There is mild calcification of the aortic valve. Aortic valve regurgitation is not visualized. Aortic valve sclerosis is present, with no evidence of aortic valve stenosis.  Pulmonic Valve: The pulmonic  valve was normal in structure. Pulmonic valve regurgitation is not visualized. No evidence of pulmonic stenosis.  Aorta: The aortic root is normal in size and structure.  Venous: The inferior vena cava is normal in size with greater than 50% respiratory variability, suggesting right atrial pressure of 3 mmHg.  IAS/Shunts: No atrial level shunt detected by color flow Doppler.   LEFT VENTRICLE PLAX 2D LVIDd:         3.60 cm LVIDs:         1.70 cm LV PW:         1.00 cm LV IVS:        1.50 cm LVOT diam:     2.00 cm LV SV:         72 LV SV Index:   32 LVOT Area:     3.14 cm   RIGHT VENTRICLE RV Basal diam:  3.00 cm RV S prime:     15.80 cm/s TAPSE (M-mode): 1.6 cm RVSP:           43.4 mmHg  LEFT ATRIUM             Index        RIGHT ATRIUM           Index LA diam:        5.30 cm 2.40 cm/m   RA Pressure: 3.00 mmHg LA Vol (A2C):   51.3 ml 23.19 ml/m  RA Area:     17.70 cm LA Vol (A4C):   89.3 ml 40.36 ml/m  RA Volume:   43.00 ml  19.43 ml/m LA Biplane Vol: 68.0 ml 30.73 ml/m AORTIC VALVE LVOT Vmax:   107.47 cm/s LVOT Vmean:  72.133 cm/s LVOT VTI:    0.228 m  AORTA Ao Root diam: 3.60 cm Ao Asc diam:  3.90 cm  TRICUSPID VALVE TR Peak grad:   40.4 mmHg TR Vmax:        318.00 cm/s Estimated RAP:  3.00 mmHg RVSP:           43.4 mmHg  SHUNTS Systemic VTI:  0.23 m Systemic Diam: 2.00 cm  Donato Schultz MD Electronically signed by Donato Schultz MD Signature Date/Time: 06/12/2022/2:29:34 PM    Final             EKG:  EKG with atrial fibrillation with HR 62bpm o  Recent Labs: 11/24/2022: BNP 1,069.5 08/03/2023: TSH 4.858 10/25/2023: ALT 9; BUN 33; Creatinine 2.36; Hemoglobin 13.8; Platelet Count 151; Potassium 4.0; Sodium 141   Recent Lipid Panel    Component Value Date/Time   CHOL 89 (L) 04/03/2023 1351   TRIG 49 04/03/2023 1351   HDL 32 (L) 04/03/2023 1351   CHOLHDL 2.8 04/03/2023 1351   CHOLHDL 3 05/02/2022 1105   VLDL 10.2 05/02/2022 1105   LDLCALC 45  04/03/2023 1351    Risk Assessment/Calculations:    CHA2DS2-VASc Score = 6 [CHF History: 1, HTN History: 1, Diabetes History: 1, Stroke History: 0, Vascular Disease History: 1, Age Score: 2, Gender Score: 0].  Therefore, the patient's annual risk of stroke  is 9.7 %.      HAS-BLED score 3 Hypertension (Uncontrolled in 30 days)  No  Abnormal renal and liver function (Dialysis, transplant, Cr >2.26 mg/dL /Cirrhosis or Bilirubin >2x Normal or AST/ALT/AP >3x Normal) Yes  Stroke No  Bleeding Yes  Labile INR (Unstable/high INR) No  Elderly (>65) Yes  Drugs or alcohol (>= 8 drinks/week, anti-plt or NSAID) No   Physical Exam:    VS:  BP 134/60   Pulse 62   Ht 5\' 11"  (1.803 m)   Wt 227 lb 12.8 oz (103.3 kg)   SpO2 94%   BMI 31.77 kg/m     Wt Readings from Last 3 Encounters:  11/05/23 227 lb 12.8 oz (103.3 kg)  10/25/23 230 lb (104.3 kg)  10/23/23 228 lb 6.4 oz (103.6 kg)    General: Well developed, well nourished, NAD Lungs:Clear to ausculation bilaterally. No wheezes, rales, or rhonchi. Breathing is unlabored. Cardiovascular: Irregularly irregular. No murmurs Extremities: No edema.  Neuro: Alert and oriented. No focal deficits. No facial asymmetry. MAE spontaneously. Psych: Responds to questions appropriately with normal affect.    ASSESSMENT/PLAN:    Permanent atrial fibrillation: Referred for LAAO due to recurrent hematuria with anticoagulation. CT deferred due to renal disease. Attempted to restart Eliquis 2.5mg  BID however reports recurrent hematuria with large clots. Plan to stop Eliquis at this time and he will start ASA and Plavix one week prior to LAAO scheduled for 11/29/2023. Obtain CBC, BMET today. Instruction letter reviewed. CHG soap given.   CKD stage IV: Baseline Cr appears to be in the 2.3 range. CT avoided due to renal disease. Anticipate post implant TEE given this. Obtain BMET today   CAD: Known CTO of the RCA with collaterals. No anginal symptoms. Continue  current regimen. Will plan indefinite ASA after 6 months post implant therapy with DAPT.   HFpEF: Appears euvolemic on exam today with NYHA class I symptoms. Continue Lasix 20mg  every day. Weight stable.   HTN: BP appears to be within goal limits on recent office visits. No change today.   I spent 25 minutes caring for this patient today including face-to-face discussions, ordering and reviewing labs, reviewing records from [facility] and other outside facilities, documenting in the record, and arranging for follow up.     Medication Adjustments/Labs and Tests Ordered: Current medicines are reviewed at length with the patient today.  Concerns regarding medicines are outlined above.  Orders Placed This Encounter  Procedures   Basic metabolic panel   CBC   EKG 12-Lead   Meds ordered this encounter  Medications   aspirin EC 81 MG tablet    Sig: Take 1 tablet (81 mg total) by mouth daily. Swallow whole.   clopidogrel (PLAVIX) 75 MG tablet    Sig: Take 1 tablet (75 mg total) by mouth daily.    Dispense:  90 tablet    Refill:  3    Patient Instructions  Medication Instructions:  Your physician has recommended you make the following change in your medication:  STOP ELIQUIS  START ASPIRIN 81 MG AND PLAVIX 75 MG ON 11/22/23 *If you need a refill on your cardiac medications before your next appointment, please call your pharmacy*   Lab Work: TODAY: BMET, CBC  If you have labs (blood work) drawn today and your tests are completely normal, you will receive your results only by: MyChart Message (if you have MyChart) OR A paper copy in the mail If you have any lab test that is abnormal  or we need to change your treatment, we will call you to review the results.   Testing/Procedures: SEE INSTRUCTION LETTER    Follow-Up: At [facility], you and your health needs are our priority.  As part of our continuing mission to provide you with exceptional heart care, we have created  designated Provider Care Teams.  These Care Teams include your primary Cardiologist (physician) and Advanced Practice Providers (APPs -  Physician Assistants and Nurse Practitioners) who all work together to provide you with the care you need, when you need it.  We recommend signing up for the patient portal called "MyChart".  Sign up information is provided on this After Visit Summary.  MyChart is used to connect with patients for Virtual Visits (Telemedicine).  Patients are able to view lab/test results, encounter notes, upcoming appointments, etc.  Non-urgent messages can be sent to your provider as well.   To learn more about what you can do with MyChart, go to [email or web address].    Your next appointment:   KEEP SCHEDULED FOLLOW-UP         Signed, Georgie Chard, NP  11/05/2023 2:23 PM    [facility]

## 2023-11-06 ENCOUNTER — Encounter: Payer: Self-pay | Admitting: Hematology

## 2023-11-06 LAB — CBC
Hematocrit: 41.7 % (ref 37.5–51.0)
Hemoglobin: 13.6 g/dL (ref 13.0–17.7)
MCH: 29.3 pg (ref 26.6–33.0)
MCHC: 32.6 g/dL (ref 31.5–35.7)
MCV: 90 fL (ref 79–97)
Platelets: 156 10*3/uL (ref 150–450)
RBC: 4.64 x10E6/uL (ref 4.14–5.80)
RDW: 14.4 % (ref 11.6–15.4)
WBC: 10.1 10*3/uL (ref 3.4–10.8)

## 2023-11-06 LAB — BASIC METABOLIC PANEL
BUN/Creatinine Ratio: 12 (ref 10–24)
BUN: 24 mg/dL (ref 8–27)
CO2: 22 mmol/L (ref 20–29)
Calcium: 9 mg/dL (ref 8.6–10.2)
Chloride: 107 mmol/L — ABNORMAL HIGH (ref 96–106)
Creatinine, Ser: 2.02 mg/dL — ABNORMAL HIGH (ref 0.76–1.27)
Glucose: 125 mg/dL — ABNORMAL HIGH (ref 70–99)
Potassium: 4.8 mmol/L (ref 3.5–5.2)
Sodium: 141 mmol/L (ref 134–144)
eGFR: 34 mL/min/{1.73_m2} — ABNORMAL LOW (ref >59)

## 2023-11-08 DIAGNOSIS — N184 Chronic kidney disease, stage 4 (severe): Secondary | ICD-10-CM | POA: Diagnosis not present

## 2023-11-08 DIAGNOSIS — R809 Proteinuria, unspecified: Secondary | ICD-10-CM | POA: Diagnosis not present

## 2023-11-08 DIAGNOSIS — E1122 Type 2 diabetes mellitus with diabetic chronic kidney disease: Secondary | ICD-10-CM | POA: Diagnosis not present

## 2023-11-08 DIAGNOSIS — I2510 Atherosclerotic heart disease of native coronary artery without angina pectoris: Secondary | ICD-10-CM | POA: Diagnosis not present

## 2023-11-08 DIAGNOSIS — N400 Enlarged prostate without lower urinary tract symptoms: Secondary | ICD-10-CM | POA: Diagnosis not present

## 2023-11-08 DIAGNOSIS — I129 Hypertensive chronic kidney disease with stage 1 through stage 4 chronic kidney disease, or unspecified chronic kidney disease: Secondary | ICD-10-CM | POA: Diagnosis not present

## 2023-11-08 DIAGNOSIS — Z905 Acquired absence of kidney: Secondary | ICD-10-CM | POA: Diagnosis not present

## 2023-11-13 ENCOUNTER — Encounter: Payer: Self-pay | Admitting: Hematology

## 2023-11-20 ENCOUNTER — Telehealth: Payer: Self-pay | Admitting: Cardiology

## 2023-11-20 NOTE — Telephone Encounter (Signed)
 Patient contacted today as a reminder to start ASA 81mg  daily and Plavix  75mg  daily on 11/22/2023 in preparation for LAAO with Watchman scheduled for 11/29/2023. Patient verbalized understanding and all questions answered.   Kate Minus NP-C Structural Heart Team  Phone: [phone]

## 2023-11-22 ENCOUNTER — Inpatient Hospital Stay: Payer: Medicare PPO | Admitting: Hematology

## 2023-11-22 ENCOUNTER — Inpatient Hospital Stay: Payer: Medicare PPO

## 2023-11-22 ENCOUNTER — Encounter: Payer: Self-pay | Admitting: Hematology

## 2023-11-22 ENCOUNTER — Other Ambulatory Visit: Payer: Self-pay | Admitting: *Deleted

## 2023-11-22 ENCOUNTER — Inpatient Hospital Stay: Payer: Medicare PPO | Attending: Hematology

## 2023-11-22 ENCOUNTER — Other Ambulatory Visit: Payer: Self-pay

## 2023-11-22 VITALS — BP 160/99 | HR 68 | Temp 97.6°F | Resp 18 | Wt 222.6 lb

## 2023-11-22 DIAGNOSIS — Z5112 Encounter for antineoplastic immunotherapy: Secondary | ICD-10-CM | POA: Insufficient documentation

## 2023-11-22 DIAGNOSIS — C689 Malignant neoplasm of urinary organ, unspecified: Secondary | ICD-10-CM | POA: Diagnosis not present

## 2023-11-22 DIAGNOSIS — R351 Nocturia: Secondary | ICD-10-CM | POA: Diagnosis not present

## 2023-11-22 DIAGNOSIS — C680 Malignant neoplasm of urethra: Secondary | ICD-10-CM | POA: Diagnosis not present

## 2023-11-22 DIAGNOSIS — R0600 Dyspnea, unspecified: Secondary | ICD-10-CM | POA: Diagnosis not present

## 2023-11-22 DIAGNOSIS — C679 Malignant neoplasm of bladder, unspecified: Secondary | ICD-10-CM

## 2023-11-22 LAB — CMP (CANCER CENTER ONLY)
ALT: 10 U/L (ref 0–44)
AST: 14 U/L — ABNORMAL LOW (ref 15–41)
Albumin: 4 g/dL (ref 3.5–5.0)
Alkaline Phosphatase: 105 U/L (ref 38–126)
Anion gap: 6 (ref 5–15)
BUN: 30 mg/dL — ABNORMAL HIGH (ref 8–23)
CO2: 23 mmol/L (ref 22–32)
Calcium: 9.6 mg/dL (ref 8.9–10.3)
Chloride: 112 mmol/L — ABNORMAL HIGH (ref 98–111)
Creatinine: 2.19 mg/dL — ABNORMAL HIGH (ref 0.61–1.24)
GFR, Estimated: 30 mL/min — ABNORMAL LOW (ref >60)
Glucose, Bld: 183 mg/dL — ABNORMAL HIGH (ref 70–99)
Potassium: 4.2 mmol/L (ref 3.5–5.1)
Sodium: 141 mmol/L (ref 135–145)
Total Bilirubin: 3.1 mg/dL — ABNORMAL HIGH (ref 0.0–1.2)
Total Protein: 7.4 g/dL (ref 6.5–8.1)

## 2023-11-22 LAB — CBC WITH DIFFERENTIAL (CANCER CENTER ONLY)
Abs Immature Granulocytes: 0.03 10*3/uL (ref 0.00–0.07)
Basophils Absolute: 0.1 10*3/uL (ref 0.0–0.1)
Basophils Relative: 1 %
Eosinophils Absolute: 0.1 10*3/uL (ref 0.0–0.5)
Eosinophils Relative: 2 %
HCT: 44.4 % (ref 39.0–52.0)
Hemoglobin: 14.6 g/dL (ref 13.0–17.0)
Immature Granulocytes: 0 %
Lymphocytes Relative: 14 %
Lymphs Abs: 1.2 10*3/uL (ref 0.7–4.0)
MCH: 28.9 pg (ref 26.0–34.0)
MCHC: 32.9 g/dL (ref 30.0–36.0)
MCV: 87.9 fL (ref 80.0–100.0)
Monocytes Absolute: 1.1 10*3/uL — ABNORMAL HIGH (ref 0.1–1.0)
Monocytes Relative: 12 %
Neutro Abs: 6 10*3/uL (ref 1.7–7.7)
Neutrophils Relative %: 71 %
Platelet Count: 171 10*3/uL (ref 150–400)
RBC: 5.05 MIL/uL (ref 4.22–5.81)
RDW: 16 % — ABNORMAL HIGH (ref 11.5–15.5)
WBC Count: 8.5 10*3/uL (ref 4.0–10.5)
nRBC: 0 % (ref 0.0–0.2)

## 2023-11-22 MED ORDER — SODIUM CHLORIDE 0.9 % IV SOLN
200.0000 mg | Freq: Once | INTRAVENOUS | Status: AC
  Administered 2023-11-22: 200 mg via INTRAVENOUS
  Filled 2023-11-22: qty 8

## 2023-11-22 MED ORDER — SODIUM CHLORIDE 0.9 % IV SOLN: Freq: Once | INTRAVENOUS | Status: AC

## 2023-11-22 NOTE — Assessment & Plan Note (Signed)
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  He has had multiple BCG maintenance therapy, is BCG refractory. -Given his BCG refractory disease, recurrence in bladder and the urethra, I recommend systemic therapy with Keytruda every 3 weeks (or every 6 weeks if he tolerates well) for 2 years, per NCCN guideline.  He had no history of autoimmune disease, overall in good health, there is no contraindication for immunotherapy. --he started on 06/15/2023. C4 was postponed due to dyspnea and hypoxia on exertion, CT chest was negative for pneumonitis  -repeated cystoscopy and cytology was still positive in early Nov 2024. I discussed with Dr. Laverle Patter and we decide continue Keytruda for additional 3 months and re-evaluate

## 2023-11-22 NOTE — Progress Notes (Signed)
 [facility]   Telephone:([phone]) [phone] Fax:([phone]) [phone]   Clinic Follow up Note   Patient Care Team: [name], MD as PCP - General (Family Medicine) [name], MD as PCP - Cardiology (Cardiology) [name], MD as PCP - Electrophysiology (Cardiology) [name], MD as Consulting Physician (Urology) [name], MD as Consulting Physician (Cardiology) [name], MD as Consulting Physician (Nephrology) [name], MD as Consulting Physician (Hematology and Oncology) [facility]  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  He has had multiple BCG maintenance therapy, is BCG refractory. -Given his BCG refractory disease, recurrence in bladder and the urethra, I recommend systemic therapy with Keytruda  every 3 weeks (or every 6 weeks if he tolerates well) for 2 years, per NCCN guideline.  He had no history of autoimmune disease, overall in good health, there is no contraindication for immunotherapy. --he started on 06/15/2023. C4 was postponed due to dyspnea and hypoxia on exertion, CT chest was negative for pneumonitis  -repeated cystoscopy and cytology was still positive in early Nov 2024. I discussed with Dr. Renda and we decide continue Keytruda  for additional 3 months and re-evaluate     Assessment and Plan    Bladder Cancer Follow-up for bladder cancer. Reports cessation of bleeding for the past five weeks, likely  related to discontinuation of anticoagulants in preparation for the upcoming Watchman procedure. Previous cystoscopy in November may have contributed to the bleeding. Next cystoscopy scheduled for February or March. If cancer persists, surgery may be considered. Patient is hesitant about another surgery due to multiple previous surgeries. - Continue current treatment until next cystoscopy - Evaluate for surgery if cancer persists after next cystoscopy  Nocturia Reports frequent nocturnal urination, significantly impacting sleep. Previous treatment with 5 mg oxybutynin  was ineffective. Discussed increasing the dose to 20 mg to manage symptoms. Patient will start with 4 tablets of 5 mg each. - Increase oxybutynin  to 20 mg, starting with 4 tablets of 5 mg each - Consider long-acting oxybutynin  if symptoms persist  Dyspnea Reports persistent but slightly improved dyspnea, particularly when climbing stairs. Noted weight loss and reduced leg edema, likely due to diuretic therapy. Weight decreased from 229 lbs to 222 lbs, and leg swelling has reduced. - Continue current diuretic therapy - Monitor for further improvement in dyspnea and leg edema  General Health Maintenance Scheduled for a Watchman procedure on November 29, 2023, to prevent stroke. Previous stroke with no residual effects noted. Discussed procedure details, including duration and recovery. Patient has seen others do well with this procedure. - Proceed with Watchman procedure on November 29, 2023   Plan -Lab reviewed, adequate for treatment, will proceed with Keytruda  today and continue every 3 weeks -He will try oxybutynin  again at the high dose 20 mg daily for nocturia - Follow-up appointment on December 13, 2023 for next infusion.         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 Mild degradation secondary to lack of IV contrast and motion. 2. Similar to decrease in mild right-sided bladder wall thickening, nonspecific in the setting of prior bladder cancer. Most likely treatment related. 3. Borderline left external iliac and right inguinal adenopathy, decreased. This could represent response to therapy of metastasis or be reactive. 4. No evidence of abdominal metastasis or recurrent disease, status post right nephrectomy.   03/22/2023 Imaging   MR abdomen without contrast  IMPRESSION: 1. Mild degradation secondary to lack of IV contrast and motion. 2. Similar to decrease in mild right-sided bladder wall thickening, nonspecific in the setting of prior bladder cancer. Most likely treatment related. 3. Borderline left external iliac and right inguinal adenopathy, decreased. This could represent response to therapy of metastasis or be reactive. 4. No evidence of abdominal metastasis or recurrent disease, status post right nephrectomy.     06/08/2023 Cancer Staging   Staging form: Urinary Bladder, AJCC 8th Edition - Clinical stage from 06/08/2023: Stage 0is (cTis, cN0, cM0) - Signed by [name], MD on 06/09/2023 WHO/ISUP grade (low/high): High Grade Histologic grading system: 2 grade system   07/31/2023 Imaging   CT chest without contrast   IMPRESSION: No findings suspicious for pneumonitis.  No evidence of metastatic disease.  Aortic Atherosclerosis (ICD10-I70.0).   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  History of Present Illness   The patient, a 78 year old male with a history of bladder cancer, presents for a follow-up visit. He reports that he had been experiencing bleeding, which he believes could have been related to a cystoscopy he underwent in November. However, the bleeding has now stopped and has been clear for the past five weeks. The patient also mentions that he has stopped taking blood thinners in preparation for a Watchman procedure scheduled for the 16th of this month.  In addition to his bladder cancer, the patient also reports frequent urination at night, which is causing him significant distress. He has tried various medications for this issue, but none have provided relief. The patient also mentions that he has been experiencing some difficulty with breathing, which he believes may be related to fluid in his lungs. He reports that his legs had swollen, but the swelling has reduced after taking a fluid pill.         All other systems were reviewed with the patient and are negative.  MEDICAL HISTORY:  Past Medical History:  Diagnosis Date   Abnormal radiologic findings on diagnostic imaging of renal pelvis, ureter, or bladder    bilateral ureter abnormalities   Anticoagulant long-term use    eliquis    Anxiety    Arthritis    CAD (coronary artery  disease) cardiologist-- dr hochrein   NSTEMI 02-04-2014  per cardiac cath chronic occluded RCA w/ faint left-to-right collaterals and aneurysmal LCFx with sluggish coronary flow/   NSTEMI --11-21-2016 per cardiac cath occluded proximal RCA & mid to diastal CFX 100%, med rx. If that does not work, PTCA or CABG   Cancer [facility])    bladder and kidney   CKD (chronic kidney disease), stage III (HCC)     patient unaware   DOE (dyspnea on exertion)    Fatty liver    pt denies   Hematuria 02/2019   History of COVID-19 10/2019   History of non-ST elevation myocardial infarction (NSTEMI)    02-04-2014  and 11-21-2016  cardiac cath done both times ,  medically management   History of shingles 12/2017   slight pain and numbness still noted in the area   Hyperlipidemia    Hypertension    Insomnia    Myocardial infarction [facility]) 2015   Persistent atrial fibrillation [facility])    cardiologsit-- dr hochrein   Thoracic aortic atherosclerosis (HCC)    Type 2 diabetes mellitus (HCC)    Urinary frequency     SURGICAL HISTORY: Past Surgical History:  Procedure Laterality Date   CARDIAC CATHETERIZATION N/A 11/21/2016   Procedure: Left Heart Cath and Coronary Angiography;  Surgeon: Debby DELENA Sor, MD;  Location: [facility] INVASIVE CV [facility];  Service: Cardiovascular;  Laterality: N/A;  pRCA 100% , ostial LAD 45%, OM3 80%, mCFX to dCFX 100% (AV groove), lateral OM3 50%   COLONOSCOPY     CYSTOSCOPY WITH BIOPSY N/A 08/12/2020   Procedure: CYSTOSCOPY WITH BLADDER BIOPSY AND TRANSURETHRAL RESECTION OF BLADDER TUMOR;  Surgeon: [name], MD;  Location: [facility];  Service: Urology;  Laterality: N/A;   CYSTOSCOPY WITH BIOPSY N/A 11/10/2021   Procedure: CYSTOSCOPY WITH BLADDER BIOPSIES/ LEFT RETROGRADE;  Surgeon: [name], MD;  Location: [facility];  Service: Urology;  Laterality: N/A;   CYSTOSCOPY WITH BIOPSY Left 07/27/2022   Procedure: CYSTOSCOPY WITH  BLADDER BIOPSY, TRANSURETHRAL FULGERATION OF BLADDER, EXAM UNDER ANESTHESIA, LEFT RETROGRADE PYELOGRAM;  Surgeon: [name], MD;  Location: [facility];  Service: Urology;  Laterality: Left;   CYSTOSCOPY WITH BIOPSY N/A 04/23/2023   Procedure: CYSTOSCOPY WITH BLADDER AND PROSTATIC URETHRAL BIOPSIES;  Surgeon: [name], MD;  Location: [facility];  Service: Urology;  Laterality: N/A;  60 MINUTES NEEDED FOR CASE   CYSTOSCOPY WITH URETEROSCOPY AND STENT PLACEMENT Right  12/15/2019   Procedure: CYSTOSCOPY WITH RIGHT RETROGRADE/ RIGHT URETEROSCOPY/ BIOPSY;  Surgeon: Ottelin, Mark, MD;  Location: [facility];  Service: Urology;  Laterality: Right;   CYSTOSCOPY/RETROGRADE/URETEROSCOPY Bilateral 03/18/2018   Procedure: CYSTOSCOPY/RETROGRADE/URETEROSCOPY.;  Surgeon: Ottelin, Mark, MD;  Location: [facility];  Service: Urology;  Laterality: Bilateral;   EYE SURGERY Bilateral    cateract in January 2023   HYDROCELE EXCISION Left 07/27/2022   Procedure: HYDROCELE REPAIR;  Surgeon: [name], MD;  Location: [facility];  Service: Urology;  Laterality: Left;  GENERAL ANESTHESIA WITH PARALYSIS   KNEE ARTHROSCOPY Right    LEFT HEART CATHETERIZATION WITH CORONARY ANGIOGRAM N/A 02/04/2014   Procedure: LEFT HEART CATHETERIZATION WITH CORONARY ANGIOGRAM;  Surgeon: Deatrice DELENA Cage, MD;  Location: [facility] CATH [facility];  Service: Cardiovascular;  Laterality: N/A;  severe one-vessel CAD, chronically occluded RCA with faint left-to-right collaterals;  aneurysmal LCFx with sluggish coronary flow;  normal LVSF w/ moderately elevated LVEDP (ostialOM2 20%, pOM3 20%, pD1 20%, mCFX 50%, diffuse 20% pCFX)   PROSTATE BIOPSY N/A 08/12/2020  Procedure: BIOPSY TRANSRECTAL ULTRASONIC PROSTATE (TUBP);  Surgeon: [name], MD;  Location: [facility];  Service: Urology;  Laterality: N/A;   ROBOT ASSITED LAPAROSCOPIC NEPHROURETERECTOMY Right 03/22/2020   Procedure: XI ROBOT ASSITED LAPAROSCOPIC NEPHROURETERECTOMY;  Surgeon: [name], MD;  Location: [facility];  Service: Urology;  Laterality: Right;   TRANSTHORACIC ECHOCARDIOGRAM  11-22-2016   dr hochrein   ef 55-60%/ mild MR and TR/ moderate LAE   TRANSURETHRAL RESECTION OF BLADDER TUMOR N/A 02/10/2021   Procedure: TRANSURETHRAL RESECTION OF BLADDER TUMOR (TURBT)/ CYSTOSCOPY/ LEFT RETROGRADE;  Surgeon: [name], MD;  Location: [facility];  Service: Urology;  Laterality: N/A;  GENERAL ANESTHESIA WITH PARALYSIS    I have  reviewed the social history and family history with the patient and they are unchanged from previous note.  ALLERGIES:  is allergic to xarelto  [rivaroxaban ].  MEDICATIONS:  Current Outpatient Medications  Medication Sig Dispense Refill   amLODipine  (NORVASC ) 5 MG tablet Take 1 tablet (5 mg total) by mouth daily. 90 tablet 3   aspirin  EC 81 MG tablet Take 1 tablet (81 mg total) by mouth daily. Swallow whole.     clopidogrel  (PLAVIX ) 75 MG tablet Take 1 tablet (75 mg total) by mouth daily. 90 tablet 3   finasteride (PROSCAR) 5 MG tablet Take 5 mg by mouth daily.     furosemide  (LASIX ) 20 MG tablet Take 1 tablet (20 mg total) by mouth daily. 90 tablet 3   hydrOXYzine  (VISTARIL ) 25 MG capsule Take 1 capsule (25 mg total) by mouth every 8 (eight) hours as needed. 30 capsule 2   isosorbide  mononitrate (IMDUR ) 30 MG 24 hr tablet TAKE ONE TABLET BY MOUTH DAILY 90 tablet 3   metoprolol  tartrate (LOPRESSOR ) 25 MG tablet TAKE ONE TABLET BY MOUTH TWICE A DAY 180 tablet 2   nitroGLYCERIN  (NITROSTAT ) 0.4 MG SL tablet TAKE ONE TABLET UNDER THE TONGUE EVERY FIVE MINUTES FOR THREE DOSES AS NEEDED FOR CHEST PAIN, CALL 911 IF 2ND DOSE DOESN'T HELP 25 tablet 4   olmesartan  (BENICAR ) 40 MG tablet Take 40 mg by mouth daily.     oxybutynin  (DITROPAN -XL) 5 MG 24 hr tablet TAKE 1 TABLET (5 MG TOTAL) BY MOUTH AT BEDTIME. 30 tablet 1   Pembrolizumab  (KEYTRUDA  IV) Inject into the vein.     rosuvastatin  (CRESTOR ) 40 MG tablet TAKE 1 TABLET (40 MG TOTAL) BY MOUTH EVERY MORNING. PLEASE KEEP SCHEDULED APPOINTMENT 90 tablet 3   sitaGLIPtin  (JANUVIA ) 25 MG tablet TAKE ONE TABLET BY MOUTH DAILY 60 tablet 1   sodium bicarbonate  650 MG tablet Take 650 mg by mouth 2 (two) times daily.     tamsulosin  (FLOMAX ) 0.4 MG CAPS capsule Take 1 capsule (0.4 mg total) by mouth daily. 30 capsule 3   traZODone  (DESYREL ) 50 MG tablet Take 0.5-1 tablets (25-50 mg total) by mouth at bedtime as needed for sleep. 30 tablet 3   No current  facility-administered medications for this visit.    PHYSICAL EXAMINATION: ECOG PERFORMANCE STATUS: 1 - Symptomatic but completely ambulatory  Vitals:   11/22/23 1417  BP: (!) 160/99  Pulse: 68  Resp: 18  Temp: 97.6 F (36.4 C)  SpO2: 98%   Wt Readings from Last 3 Encounters:  11/22/23 222 lb 9.6 oz (101 kg)  11/05/23 227 lb 12.8 oz (103.3 kg)  10/25/23 230 lb (104.3 kg)     GENERAL:alert, no distress and comfortable SKIN: skin color, texture, turgor are normal, no rashes or significant lesions EYES: normal, Conjunctiva are pink and  non-injected, sclera clear NECK: supple, thyroid  normal size, non-tender, without nodularity LYMPH:  no palpable lymphadenopathy in the cervical, axillary  LUNGS: clear to auscultation and percussion with normal breathing effort HEART: regular rate & rhythm and no murmurs and no lower extremity edema ABDOMEN:abdomen soft, non-tender and normal bowel sounds Musculoskeletal:no cyanosis of digits and no clubbing  NEURO: alert & oriented x 3 with fluent speech, no focal motor/sensory deficits    LABORATORY DATA:  I have reviewed the data as listed    Latest Ref Rng & Units 11/22/2023    1:46 PM 11/05/2023   11:04 AM 10/25/2023    1:57 PM  CBC  WBC 4.0 - 10.5 K/uL 8.5  10.1  7.7   Hemoglobin 13.0 - 17.0 g/dL 85.3  86.3  86.1   Hematocrit 39.0 - 52.0 % 44.4  41.7  40.5   Platelets 150 - 400 K/uL 171  156  151         Latest Ref Rng & Units 11/22/2023    1:46 PM 11/05/2023   11:04 AM 10/25/2023    1:57 PM  CMP  Glucose 70 - 99 mg/dL 816  874  859   BUN 8 - 23 mg/dL 30  24  33   Creatinine 0.61 - 1.24 mg/dL 7.80  7.97  7.63   Sodium 135 - 145 mmol/L 141  141  141   Potassium 3.5 - 5.1 mmol/L 4.2  4.8  4.0   Chloride 98 - 111 mmol/L 112  107  112   CO2 22 - 32 mmol/L 23  22  22    Calcium  8.9 - 10.3 mg/dL 9.6  9.0  9.1   Total Protein 6.5 - 8.1 g/dL 7.4   7.1   Total Bilirubin 0.0 - 1.2 mg/dL 3.1   2.4   Alkaline Phos 38 - 126 U/L 105    96   AST 15 - 41 U/L 14   16   ALT 0 - 44 U/L 10   9       RADIOGRAPHIC STUDIES: I have personally reviewed the radiological images as listed and agreed with the findings in the report. No results found.    No orders of the defined types were placed in this encounter.  All questions were answered. The patient knows to call the clinic with any problems, questions or concerns. No barriers to learning was detected. The total time spent in the appointment was 25 minutes.     Onita Mattock, MD 11/22/2023

## 2023-11-22 NOTE — Progress Notes (Signed)
 Ok to proceed with Keytruda today with TBili 3.1.   Anola Gurney [place], [place], BCPS, BCOP 11/22/2023 3:14 PM

## 2023-11-22 NOTE — Patient Instructions (Addendum)

## 2023-11-26 NOTE — Telephone Encounter (Signed)
 Confirmed procedure date of 11/29/2023. Confirmed arrival time of 0700 for procedure time at 0930. Reviewed pre-procedure instructions with patient. MyChart message sent with updated instructions given the patient is no longer taking Eliquis  or Farxiga and is now taking ASA and Plavix . The patient understands to call if questions/concerns arise prior to procedure. He was grateful for call and agreed with plan.

## 2023-11-29 ENCOUNTER — Ambulatory Visit (HOSPITAL_COMMUNITY)
Admission: RE | Admit: 2023-11-29 | Discharge: 2023-11-29 | Disposition: A | Discharge status: Home / Self Care | Payer: Medicare PPO | Attending: Cardiology | Admitting: Cardiology

## 2023-11-29 ENCOUNTER — Telehealth: Payer: Self-pay

## 2023-11-29 ENCOUNTER — Other Ambulatory Visit: Payer: Self-pay

## 2023-11-29 ENCOUNTER — Encounter (HOSPITAL_COMMUNITY)
Admission: RE | Disposition: A | Discharge status: Home / Self Care | Payer: Self-pay | Source: Home / Self Care | Attending: Cardiology

## 2023-11-29 ENCOUNTER — Telehealth: Payer: Self-pay | Admitting: Cardiology

## 2023-11-29 ENCOUNTER — Inpatient Hospital Stay (HOSPITAL_COMMUNITY): Payer: Medicare PPO

## 2023-11-29 DIAGNOSIS — Z87448 Personal history of other diseases of urinary system: Secondary | ICD-10-CM | POA: Insufficient documentation

## 2023-11-29 DIAGNOSIS — E1159 Type 2 diabetes mellitus with other circulatory complications: Secondary | ICD-10-CM | POA: Diagnosis present

## 2023-11-29 DIAGNOSIS — Z539 Procedure and treatment not carried out, unspecified reason: Secondary | ICD-10-CM | POA: Insufficient documentation

## 2023-11-29 DIAGNOSIS — N184 Chronic kidney disease, stage 4 (severe): Secondary | ICD-10-CM | POA: Diagnosis present

## 2023-11-29 DIAGNOSIS — R310 Gross hematuria: Secondary | ICD-10-CM | POA: Diagnosis not present

## 2023-11-29 DIAGNOSIS — Z01818 Encounter for other preprocedural examination: Secondary | ICD-10-CM | POA: Diagnosis not present

## 2023-11-29 DIAGNOSIS — I5032 Chronic diastolic (congestive) heart failure: Secondary | ICD-10-CM | POA: Diagnosis present

## 2023-11-29 DIAGNOSIS — I10 Essential (primary) hypertension: Secondary | ICD-10-CM | POA: Diagnosis present

## 2023-11-29 DIAGNOSIS — I4821 Permanent atrial fibrillation: Principal | ICD-10-CM

## 2023-11-29 DIAGNOSIS — Z8546 Personal history of malignant neoplasm of prostate: Secondary | ICD-10-CM

## 2023-11-29 DIAGNOSIS — E785 Hyperlipidemia, unspecified: Secondary | ICD-10-CM | POA: Diagnosis present

## 2023-11-29 LAB — TYPE AND SCREEN
ABO/RH(D): A POS
Antibody Screen: NEGATIVE

## 2023-11-29 LAB — GLUCOSE, CAPILLARY: Glucose-Capillary: 103 mg/dL — ABNORMAL HIGH (ref 70–99)

## 2023-11-29 LAB — SURGICAL PCR SCREEN
MRSA, PCR: POSITIVE — AB
Staphylococcus aureus: POSITIVE — AB

## 2023-11-29 SURGERY — LEFT ATRIAL APPENDAGE OCCLUSION
Anesthesia: General

## 2023-11-29 MED ORDER — CHLORHEXIDINE GLUCONATE 4 % EX SOLN
Freq: Once | CUTANEOUS | Status: DC
  Filled 2023-11-29: qty 15

## 2023-11-29 MED ORDER — CEFAZOLIN SODIUM-DEXTROSE 2-4 GM/100ML-% IV SOLN
2.0000 g | INTRAVENOUS | Status: DC
  Filled 2023-11-29: qty 100

## 2023-11-29 MED ORDER — SODIUM CHLORIDE 0.9 % IV SOLN
INTRAVENOUS | Status: DC
Start: 2023-11-29 — End: 2023-11-29

## 2023-11-29 MED ORDER — SODIUM CHLORIDE 0.9 % IV SOLN: INTRAVENOUS | Status: DC

## 2023-11-29 MED ORDER — INSULIN ASPART 100 UNIT/ML IJ SOLN: 0.0000 [IU] | INTRAMUSCULAR | Status: DC | PRN

## 2023-11-29 MED ORDER — CHLORHEXIDINE GLUCONATE 0.12 % MT SOLN
OROMUCOSAL | Status: AC
  Administered 2023-11-29: 15 mL
  Filled 2023-11-29: qty 15

## 2023-11-29 NOTE — Anesthesia Preprocedure Evaluation (Addendum)
Anesthesia Evaluation  Patient identified by MRN, date of birth, ID band Patient awake    Reviewed: Allergy & Precautions, NPO status , Patient's Chart, lab work & pertinent test results  History of Anesthesia Complications Negative for: history of anesthetic complications  Airway Mallampati: II  TM Distance: >3 FB Neck ROM: Full    Dental  (+) Dental Advisory Given, Edentulous Upper, Edentulous Lower   Pulmonary neg shortness of breath, neg sleep apnea, neg COPD, neg recent URI, former smoker   breath sounds clear to auscultation       Cardiovascular hypertension, Pt. on medications and Pt. on home beta blockers + CAD, + Past MI and +CHF  + dysrhythmias Atrial Fibrillation  Rhythm:Irregular   1. Left ventricular ejection fraction, by estimation, is 60 to 65%. The  left ventricle has normal function. The left ventricle has no regional  wall motion abnormalities. There is moderate left ventricular hypertrophy  of the basal-septal segment. Left  ventricular diastolic parameters are indeterminate.   2. Right ventricular systolic function is normal. The right ventricular  size is normal. There is mildly elevated pulmonary artery systolic  pressure.   3. Left atrial size was mildly dilated.   4. The mitral valve is normal in structure. Mild mitral valve  regurgitation. No evidence of mitral stenosis.   5. The aortic valve is tricuspid. There is mild calcification of the  aortic valve. Aortic valve regurgitation is not visualized. Aortic valve  sclerosis is present, with no evidence of aortic valve stenosis.   6. The inferior vena cava is normal in size with greater than 50%  respiratory variability, suggesting right atrial pressure of 3 mmHg.     Neuro/Psych neg Seizures  Anxiety      Neuromuscular disease    GI/Hepatic negative GI ROS, Neg liver ROS,,,  Endo/Other  diabetes  Lab Results      Component                Value                Date                      HGBA1C                   6.7 (H)             08/06/2023             Renal/GU CRFRenal diseaseLab Results      Component                Value               Date                      NA                       141                 11/22/2023                K                        4.2                 11/22/2023                CO2  23                  11/22/2023                GLUCOSE                  183 (H)             11/22/2023                BUN                      30 (H)              11/22/2023                CREATININE               2.19 (H)            11/22/2023                CALCIUM                  9.6                 11/22/2023                GFR                      26.55 (L)           05/02/2022                EGFR                     34 (L)              11/05/2023                GFRNONAA                 30 (L)              11/22/2023             S/p right kidney removal  negative genitourinary   Musculoskeletal  (+) Arthritis , Osteoarthritis,    Abdominal Normal abdominal exam  (+)   Peds  Hematology negative hematology ROS (+) Lab Results      Component                Value               Date                      WBC                      8.5                 11/22/2023                HGB                      14.6                11/22/2023                HCT  44.4                11/22/2023                MCV                      87.9                11/22/2023                PLT                      171                 11/22/2023                        Anesthesia Other Findings   Reproductive/Obstetrics                              Anesthesia Physical Anesthesia Plan  ASA: 3  Anesthesia Plan: General   Post-op Pain Management: Minimal or no pain anticipated   Induction: Intravenous  PONV Risk Score and Plan: 2 and Ondansetron, Dexamethasone and Treatment may  vary due to age or medical condition  Airway Management Planned: Oral ETT  Additional Equipment: ClearSight  Intra-op Plan:   Post-operative Plan: Extubation in OR  Informed Consent: I have reviewed the patients History and Physical, chart, labs and discussed the procedure including the risks, benefits and alternatives for the proposed anesthesia with the patient or authorized representative who has indicated his/her understanding and acceptance.     Dental advisory given  Plan Discussed with: CRNA  Anesthesia Plan Comments:         Anesthesia Quick Evaluation

## 2023-11-29 NOTE — Telephone Encounter (Signed)
The patient's Watchman procedure was cancelled today due to hematuria.  Per Dr. Jimmey Ralph, will reschedule the patient for LAAO 12/13/2023. Scheduled the patient for check-up to assess symptoms 12/10/2023.  Called the patient and confirmed this plan. Reiterate to call if bleeding returns/continues. He understood if he cannot tolerate short term DAPT, he may not be a candidate for LAAO. He was grateful for call and agreed with plan.

## 2023-11-29 NOTE — Progress Notes (Signed)
Per Dr. Jimmey Ralph today's case is cancelled due to hematuria. 16 gauge PIV right anterior forearm removed and 16 gauge PIV left anterior forearm removed. Sites WNL. Gauze applied and taped. Patient got dressed and was discharged via wheelchair with his wife. Denies any complaints.

## 2023-11-29 NOTE — Telephone Encounter (Signed)
Spoke with Melissa with [employer] who reports pt is positive for MRSA.  Phone call to cath lab and requested staff member notify Dr Jimmey Ralph of result.  RN requested staff member's name but staff member hung up the phone before providing name.

## 2023-11-29 NOTE — Telephone Encounter (Signed)
Caller (Melissa) called to report critical results.

## 2023-11-29 NOTE — Discharge Instructions (Signed)
We have scheduled you a follow up visit with Georgie Chard NP-C on 12/10/23 at 9:05AM with a tentative plan to proceed with [facility] 12/13/23 if no further bleeding.

## 2023-11-29 NOTE — Progress Notes (Signed)
Dr. Nance Pew made aware patient's total bilirubin= 3.1. No new orders received.

## 2023-12-03 ENCOUNTER — Ambulatory Visit: Payer: Self-pay | Admitting: Family Medicine

## 2023-12-03 NOTE — Telephone Encounter (Signed)
  Chief Complaint: urinary symptoms Symptoms: frequency, burning Frequency: 1 week ago Pertinent Negatives: Patient denies fever, back pain Disposition: [] ED /[] Urgent Care (no appt availability in office) / [x] Appointment(In office/virtual)/ []  [facility]/ [] Home Care/ [] Refused Recommended Disposition /[] [facility]/ []  Follow-up with PCP Additional Notes: Patient reports urinary frequency and burning for [redacted] week along with blood in urine. Patient denies fever and flank pain. Scheduled patient per protocol on 12/04/2023. Patient verbalized understanding and to call back with worsening symptoms.    Copied from CRM [id number]. Topic: Clinical - Red Word Triage >> Dec 03, 2023 10:17 AM Pascal Lux wrote: Red Word that prompted transfer to Nurse Triage: May be experiencing a UTI current pain and burning. Reason for Disposition  Urinating more frequently than usual (i.e., frequency)  Answer Assessment - Initial Assessment Questions 1. SYMPTOM: "What's the main symptom you're concerned about?" (e.g., frequency, incontinence)     Urinary frequency, burning 2. ONSET: "When did the  urinary symptoms  start?"     A week ago 3. PAIN: "Is there any pain?" If Yes, ask: "How bad is it?" (Scale: 1-10; mild, moderate, severe)     Burning, frequency "I can't sleep at night" 4. CAUSE: "What do you think is causing the symptoms?"     UTI 5. OTHER SYMPTOMS: "Do you have any other symptoms?" (e.g., blood in urine, fever, flank pain, pain with urination)     Yes I do see blood. Denies back pain  Protocols used: Urinary Symptoms-A-AH

## 2023-12-03 NOTE — Telephone Encounter (Signed)
Patient scheduled with Dr Janee Morn on 12/04/23. Dm/cma

## 2023-12-04 ENCOUNTER — Encounter: Payer: Self-pay | Admitting: Family Medicine

## 2023-12-04 ENCOUNTER — Ambulatory Visit: Payer: Medicare PPO | Admitting: Family Medicine

## 2023-12-04 VITALS — BP 162/98 | HR 83 | Temp 97.7°F | Ht 71.0 in | Wt 224.8 lb

## 2023-12-04 DIAGNOSIS — Z8546 Personal history of malignant neoplasm of prostate: Secondary | ICD-10-CM | POA: Diagnosis not present

## 2023-12-04 DIAGNOSIS — Z87448 Personal history of other diseases of urinary system: Secondary | ICD-10-CM | POA: Diagnosis not present

## 2023-12-04 DIAGNOSIS — C679 Malignant neoplasm of bladder, unspecified: Secondary | ICD-10-CM | POA: Diagnosis not present

## 2023-12-04 DIAGNOSIS — R350 Frequency of micturition: Secondary | ICD-10-CM

## 2023-12-04 MED ORDER — CEPHALEXIN 500 MG PO CAPS: 500.0000 mg | ORAL_CAPSULE | Freq: Two times a day (BID) | ORAL | 0 refills | Status: AC

## 2023-12-04 NOTE — Assessment & Plan Note (Signed)
Patient presents with complex urologic history including active bladder cancer with hematuria and dysuria.  Concern for possible UTI.  Also on the differential includes of worsening bladder malignancy or worsening of chronic urinary symptoms from recent diuresis or incomplete control from oxybutynin.  Plan: Urinalysis with reflex to culture Empirically treat with cephalexin 500 mg twice daily Discontinue antibiotic if culture is negative Monitor symptoms closely Recommend close follow-up with Urology for bladder cancer treatment Follow-up if worsening symptoms including fevers, chills, abdominal pain, nausea vomiting or other urologic symptoms Seek emergency care if symptoms are severe

## 2023-12-04 NOTE — Progress Notes (Signed)
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  Concern for possible UTI.  Also on the differential includes of worsening bladder malignancy or worsening of chronic urinary symptoms from recent diuresis or incomplete control from oxybutynin.  Plan: Urinalysis with reflex to culture Empirically treat with cephalexin 500 mg twice daily Discontinue antibiotic if culture is negative Monitor symptoms closely Recommend close follow-up with Urology for bladder cancer treatment Follow-up if worsening symptoms including fevers, chills, abdominal pain, nausea vomiting or other urologic symptoms Seek emergency care if symptoms are severe      History of prostate cancer   Relevant Medications   cephALEXin (KEFLEX) 500 MG capsule   Other Relevant Orders   Urinalysis w microscopic + reflex cultur   Hx of hematuria - Primary   Relevant Medications   cephALEXin (KEFLEX) 500 MG capsule   Other Relevant Orders   Urinalysis w microscopic + reflex cultur    There are no discontinued medications.  No follow-ups on file.    Subjective:   Encounter date: 12/04/2023  Gillermo Paller is a 78 y.o. 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   He  has a past medical history of Abnormal radiologic findings on diagnostic imaging of renal pelvis, ureter, or bladder, Anticoagulant long-term use, Anxiety, Arthritis, CAD (coronary artery disease) (cardiologist-- dr Antoine Poche), Cancer [facility]), CKD (chronic kidney disease), stage III (HCC), DOE (dyspnea on exertion), Fatty liver, Hematuria (02/2019), History of COVID-19 (10/2019), History of non-ST elevation myocardial infarction (NSTEMI), History of shingles (12/2017), Hyperlipidemia, Hypertension, Insomnia, Myocardial infarction (HCC) (2015), Persistent atrial fibrillation (HCC), Thoracic aortic atherosclerosis (HCC), Type 2 diabetes mellitus (HCC), and Urinary frequency..   Chief Complaint:  Hematuria and dysuria for 1 week.  History of Present Illness: The patient is a male with a complex medical history significant for prostate cancer and current bladder cancer undergoing treatment. He presents with a 1-week history of hematuria, worsening urinary frequency, and dysuria.  He reports that his symptoms are starting to improve today.  He was recently started on Lasix (furosemide) as a diuretic.  He takes tamsulosin for chronic lower urinary tract symptoms.  He takes oxybutynin for chronic urinary frequency.Marland Kitchen  He takes he has a history of chronic kidney disease stage 4 and is status post nephrectomy. Recent laboratory work from two weeks ago showed stable  renal function with a GFR of  30.  He is tolerating p.o. fluids.  Reports that he decreased his recent fluid intake to try to reduce the amount of urination, however this did not change symptoms and he has resumed normal fluid intake.   Review of Systems  Constitutional:  Negative for chills, diaphoresis, fever, malaise/fatigue and weight loss.  HENT:  Negative for congestion, ear discharge, ear pain and hearing loss.   Eyes:  Negative for blurred vision, double vision, photophobia, pain, discharge and redness.  Respiratory:  Negative for cough, sputum production, shortness of breath and wheezing.   Cardiovascular:  Negative for chest pain and palpitations.  Gastrointestinal:  Negative for abdominal pain, blood in stool, constipation, diarrhea, heartburn, melena, nausea and vomiting.  Genitourinary:  Positive for dysuria, frequency, hematuria and urgency. Negative for flank pain.  Musculoskeletal:  Negative for myalgias.  Skin:  Negative for itching and rash.  Neurological:  Negative for dizziness, tingling, tremors, speech change, seizures, loss of consciousness, weakness and headaches.  Endo/Heme/Allergies:  Negative for polydipsia.  Psychiatric/Behavioral:  Negative for depression, hallucinations, memory loss, substance abuse and suicidal ideas. The patient does not have insomnia.   All other systems reviewed and are negative.   Past Surgical History:  Procedure Laterality Date   CARDIAC CATHETERIZATION N/A 11/21/2016   Procedure: Left Heart Cath and Coronary Angiography;  Surgeon: Lennette Bihari, MD;  [facility];  Service: Cardiovascular;  Laterality: N/A;  pRCA 100% , ostial LAD 45%, OM3 80%, mCFX to dCFX 100% (AV groove), lateral OM3 50%   COLONOSCOPY     CYSTOSCOPY WITH BIOPSY N/A 08/12/2020   Procedure: CYSTOSCOPY WITH BLADDER BIOPSY AND TRANSURETHRAL RESECTION OF BLADDER TUMOR;  Surgeon: Heloise Purpura, MD;  Location: [facility];  Service: Urology;  Laterality: N/A;    CYSTOSCOPY WITH BIOPSY N/A 11/10/2021   Procedure: CYSTOSCOPY WITH BLADDER BIOPSIES/ LEFT RETROGRADE;  Surgeon: Heloise Purpura, MD;  Location: [facility];  Service: Urology;  Laterality: N/A;   CYSTOSCOPY WITH BIOPSY Left 07/27/2022   Procedure: CYSTOSCOPY WITH  BLADDER BIOPSY, TRANSURETHRAL FULGERATION OF BLADDER, EXAM UNDER ANESTHESIA, LEFT RETROGRADE PYELOGRAM;  Surgeon: Heloise Purpura, MD;  Location: [facility];  Service: Urology;  Laterality: Left;   CYSTOSCOPY WITH BIOPSY N/A 04/23/2023   Procedure: CYSTOSCOPY WITH BLADDER AND PROSTATIC URETHRAL BIOPSIES;  Surgeon: Heloise Purpura, MD;  Location: [facility];  Service: Urology;  Laterality: N/A;  60 MINUTES NEEDED FOR CASE   CYSTOSCOPY WITH URETEROSCOPY AND STENT PLACEMENT Right 12/15/2019   Procedure: CYSTOSCOPY WITH RIGHT RETROGRADE/ RIGHT URETEROSCOPY/ BIOPSY;  Surgeon: Ihor Gully, MD;  Location: [facility];  Service: Urology;  Laterality: Right;   CYSTOSCOPY/RETROGRADE/URETEROSCOPY Bilateral 03/18/2018   Procedure: CYSTOSCOPY/RETROGRADE/URETEROSCOPY.;  Surgeon: Ihor Gully, MD;  Location: [facility];  Service: Urology;  Laterality: Bilateral;   EYE SURGERY Bilateral    cateract in January 2023   HYDROCELE EXCISION Left 07/27/2022   Procedure: HYDROCELE REPAIR;  Surgeon: Heloise Purpura, MD;  [facility]: [facility];  Service: Urology;  Laterality: Left;  GENERAL ANESTHESIA WITH PARALYSIS   KNEE ARTHROSCOPY Right    LEFT HEART CATHETERIZATION WITH CORONARY ANGIOGRAM N/A 02/04/2014   Procedure: LEFT HEART CATHETERIZATION WITH CORONARY ANGIOGRAM;  Surgeon: Iran Ouch, MD;  [facility]: [facility];  Service: Cardiovascular;  Laterality: N/A;  severe one-vessel CAD, chronically occluded RCA with faint left-to-right collaterals;  aneurysmal LCFx with sluggish coronary flow;  normal LVSF w/ moderately elevated LVEDP (ostialOM2 20%, pOM3 20%, pD1 20%, mCFX 50%, diffuse 20% pCFX)   PROSTATE BIOPSY  N/A 08/12/2020   Procedure: BIOPSY  TRANSRECTAL ULTRASONIC PROSTATE (TUBP);  Surgeon: Heloise Purpura, MD;  [facility];  Service: Urology;  Laterality: N/A;   ROBOT ASSITED LAPAROSCOPIC NEPHROURETERECTOMY Right 03/22/2020   Procedure: XI ROBOT ASSITED LAPAROSCOPIC NEPHROURETERECTOMY;  Surgeon: Heloise Purpura, MD;  Location: [facility];  Service: Urology;  Laterality: Right;   TRANSTHORACIC ECHOCARDIOGRAM  11-22-2016   dr hochrein   ef 55-60%/ mild MR and TR/ moderate LAE   TRANSURETHRAL RESECTION OF BLADDER TUMOR N/A 02/10/2021   Procedure: TRANSURETHRAL RESECTION OF BLADDER TUMOR (TURBT)/ CYSTOSCOPY/ LEFT RETROGRADE;  Surgeon: Heloise Purpura, MD;  Location[facility];  Service: Urology;  Laterality: N/A;  GENERAL ANESTHESIA WITH PARALYSIS    Outpatient Medications Prior to Visit  Medication Sig Dispense Refill   amLODipine (NORVASC) 5 MG tablet Take 1 tablet (5 mg total) by mouth daily. 90 tablet 3   aspirin EC 81 MG tablet Take 1 tablet (81 mg total) by mouth daily. Swallow whole.     Cholecalciferol (VITAMIN D3) 1000 units CAPS Take 1,000 Units by mouth daily.     clopidogrel (PLAVIX) 75 MG tablet Take 1 tablet (75 mg total) by mouth daily. 90 tablet 3   furosemide (LASIX) 20 MG tablet Take 1 tablet (20 mg total) by mouth daily. (Patient taking differently: Take 40 mg by mouth daily as needed for fluid or edema.) 90 tablet 3   isosorbide mononitrate (IMDUR) 30 MG 24 hr tablet TAKE ONE TABLET BY MOUTH DAILY 90 tablet 3   metoprolol tartrate (LOPRESSOR) 25 MG tablet TAKE ONE TABLET BY MOUTH TWICE A DAY 180 tablet 2   olmesartan (BENICAR) 40 MG tablet Take 40 mg by mouth daily.     oxybutynin (DITROPAN-XL) 5 MG 24 hr tablet TAKE 1 TABLET (5 MG TOTAL) BY MOUTH AT BEDTIME. (Patient taking differently: Take 20 mg by mouth at bedtime.) 30 tablet 1   Pembrolizumab (KEYTRUDA IV) Inject into the vein.     rosuvastatin (CRESTOR) 40 MG tablet TAKE 1 TABLET (40 MG TOTAL) BY MOUTH EVERY MORNING. PLEASE KEEP SCHEDULED APPOINTMENT 90 tablet 3    sitaGLIPtin (JANUVIA) 25 MG tablet TAKE ONE TABLET BY MOUTH DAILY 60 tablet 1   sodium bicarbonate 650 MG tablet Take 650 mg by mouth 2 (two) times daily.     tamsulosin (FLOMAX) 0.4 MG CAPS capsule Take 1 capsule (0.4 mg total) by mouth daily. 30 capsule 3   traZODone (DESYREL) 50 MG tablet Take 0.5-1 tablets (25-50 mg total) by mouth at bedtime as needed for sleep. 30 tablet 3   hydrOXYzine (VISTARIL) 25 MG capsule Take 1 capsule (25 mg total) by mouth every 8 (eight) hours as needed. (Patient not taking: Reported on 11/26/2023) 30 capsule 2   nitroGLYCERIN (NITROSTAT) 0.4 MG SL tablet TAKE ONE TABLET UNDER THE TONGUE EVERY FIVE MINUTES FOR THREE DOSES AS NEEDED FOR CHEST PAIN, CALL 911 IF 2ND DOSE DOESN'T HELP (Patient not taking: Reported on 12/04/2023) 25 tablet 4   phenylephrine (NEO-SYNEPHRINE) 1 % nasal spray Place 1 drop into both nostrils every 6 (six) hours as needed for congestion. (Patient not taking: Reported on 12/04/2023)     No facility-administered medications prior to visit.    Family History  Problem Relation Age of Onset   Hypertension Mother    Cancer Mother        anal cancer   Esophageal cancer Neg Hx    Pancreatic cancer Neg Hx    Stomach cancer Neg Hx    Liver  disease Neg Hx     Social History   Socioeconomic History   Marital status: Married    Spouse name: Darl Pikes   Number of children: 3   Years of education: Not on file   Highest education level: Master's degree (e.g., MA, MS, MEng, MEd, MSW, MBA)  Occupational History   Occupation: Retired- Psychologist, occupational    Comment: [employer]  Tobacco Use   Smoking status: Former    Current packs/day: 0.00    Average packs/day: 0.3 packs/day for 4.0 years (1.0 ttl pk-yrs)    Types: Cigarettes    Start date: 1968    Quit date: 1972    Years since quitting: 53.0   Smokeless tobacco: Former    Types: Chew    Quit date: 1972  Vaping Use   Vaping status: Never Used  Substance and Sexual Activity   Alcohol use: No    Drug use: No   Sexual activity: Yes  Other Topics Concern   Not on file  Social History Narrative   Not on file   Social Drivers of Health   Financial Resource Strain: Low Risk  (08/06/2023)   Overall Financial Resource Strain (CARDIA)    Difficulty of Paying Living Expenses: Not hard at all  Food Insecurity: No Food Insecurity (08/06/2023)   Hunger Vital Sign    Worried About Running Out of Food in the Last Year: Never true    Ran Out of Food in the Last Year: Never true  Transportation Needs: No Transportation Needs (08/06/2023)   PRAPARE - Administrator, Civil Service (Medical): No    Lack of Transportation (Non-Medical): No  Physical Activity: Inactive (08/06/2023)   Exercise Vital Sign    Days of Exercise per Week: 0 days    Minutes of Exercise per Session: 0 min  Stress: No Stress Concern Present (08/06/2023)   [employer] of Occupational Health - Occupational Stress Questionnaire    Feeling of Stress : Not at all  Social Connections: Moderately Integrated (08/06/2023)   Social Connection and Isolation Panel [NHANES]    Frequency of Communication with Friends and Family: More than three times a week    Frequency of Social Gatherings with Friends and Family: More than three times a week    Attends Religious Services: More than 4 times per year    Active Member of [employer] or Organizations: No    Attends Banker Meetings: Never    Marital Status: Married  Catering manager Violence: Not At Risk (08/06/2023)   Humiliation, Afraid, Rape, and Kick questionnaire    Fear of Current or Ex-Partner: No    Emotionally Abused: No    Physically Abused: No    Sexually Abused: No                                                                                                  Objective:  Physical Exam: BP (!) 162/98 (BP Location: Left Arm, Patient Position: Sitting, Cuff Size: Normal) Comment: Has not taken BP Medicine  Pulse 83   Temp 97.7 F (36.5 C)  (Oral)  Ht 5\' 11"  (1.803 m)   Wt 224 lb 12.8 oz (102 kg)   SpO2 96%   BMI 31.35 kg/m     Physical Exam Constitutional:      Appearance: Normal appearance.  HENT:     Head: Normocephalic and atraumatic.     Right Ear: Hearing normal.     Left Ear: Hearing normal.     Nose: Nose normal.  Eyes:     General: No scleral icterus.       Right eye: No discharge.        Left eye: No discharge.     Extraocular Movements: Extraocular movements intact.  Cardiovascular:     Rate and Rhythm: Normal rate and regular rhythm.     Heart sounds: Normal heart sounds.  Pulmonary:     Effort: Pulmonary effort is normal.     Breath sounds: Normal breath sounds.  Abdominal:     Palpations: Abdomen is soft.     Tenderness: There is no abdominal tenderness.  Skin:    General: Skin is warm.     Findings: No rash.  Neurological:     General: No focal deficit present.     Mental Status: He is alert and oriented to person, place, and time.     Cranial Nerves: No cranial nerve deficit.  Psychiatric:        Mood and Affect: Mood normal.        Behavior: Behavior normal.        Thought Content: Thought content normal.        Judgment: Judgment normal.     No results found.  Recent Results (from the past 2160 hours)  CMP (Cancer Center only)     Status: Abnormal   Collection Time: 09/13/23  1:15 PM  Result Value Ref Range   Sodium 140 135 - 145 mmol/L   Potassium 4.1 3.5 - 5.1 mmol/L   Chloride 112 (H) 98 - 111 mmol/L   CO2 24 22 - 32 mmol/L   Glucose, Bld 219 (H) 70 - 99 mg/dL    Comment: Glucose reference range applies only to samples taken after fasting for at least 8 hours.   BUN 30 (H) 8 - 23 mg/dL   Creatinine 2.95 (H) 6.21 - 1.24 mg/dL   Calcium 9.3 8.9 - 30.8 mg/dL   Total Protein 7.1 6.5 - 8.1 g/dL   Albumin 3.7 3.5 - 5.0 g/dL   AST 19 15 - 41 U/L   ALT 12 0 - 44 U/L   Alkaline Phosphatase 100 38 - 126 U/L   Total Bilirubin 2.0 (H) 0.3 - 1.2 mg/dL   GFR, Estimated 28 (L) >60  mL/min    Comment: (NOTE) Calculated using the CKD-EPI Creatinine Equation (2021)    Anion gap 4 (L) 5 - 15    Comment: Performed at [facility], [address], [place], [place] [address]  CBC with Differential (Cancer Center Only)     Status: None   Collection Time: 09/13/23  1:15 PM  Result Value Ref Range   WBC Count 9.4 4.0 - 10.5 K/uL   RBC 4.87 4.22 - 5.81 MIL/uL   Hemoglobin 14.2 13.0 - 17.0 g/dL   HCT 69.6 29.5 - 28.4 %   MCV 87.7 80.0 - 100.0 fL   MCH 29.2 26.0 - 34.0 pg   MCHC 33.3 30.0 - 36.0 g/dL   RDW 13.2 44.0 - 10.2 %   Platelet Count 174 150 - 400  K/uL   nRBC 0.0 0.0 - 0.2 %   Neutrophils Relative % 75 %   Neutro Abs 7.0 1.7 - 7.7 K/uL   Lymphocytes Relative 13 %   Lymphs Abs 1.2 0.7 - 4.0 K/uL   Monocytes Relative 9 %   Monocytes Absolute 0.9 0.1 - 1.0 K/uL   Eosinophils Relative 2 %   Eosinophils Absolute 0.2 0.0 - 0.5 K/uL   Basophils Relative 1 %   Basophils Absolute 0.1 0.0 - 0.1 K/uL   Immature Granulocytes 0 %   Abs Immature Granulocytes 0.03 0.00 - 0.07 K/uL    Comment: Performed at [facility], [address] [place], [place] [address]  CMP (Cancer Center only)     Status: Abnormal   Collection Time: 10/25/23  1:57 PM  Result Value Ref Range   Sodium 141 135 - 145 mmol/L   Potassium 4.0 3.5 - 5.1 mmol/L   Chloride 112 (H) 98 - 111 mmol/L   CO2 22 22 - 32 mmol/L   Glucose, Bld 140 (H) 70 - 99 mg/dL    Comment: Glucose reference range applies only to samples taken after fasting for at least 8 hours.   BUN 33 (H) 8 - 23 mg/dL   Creatinine 6.04 (H) 5.40 - 1.24 mg/dL   Calcium 9.1 8.9 - 98.1 mg/dL   Total Protein 7.1 6.5 - 8.1 g/dL   Albumin 3.8 3.5 - 5.0 g/dL   AST 16 15 - 41 U/L   ALT 9 0 - 44 U/L   Alkaline Phosphatase 96 38 - 126 U/L   Total Bilirubin 2.4 (H) <1.2 mg/dL   GFR, Estimated 28 (L) >60 mL/min    Comment: (NOTE) Calculated using the CKD-EPI Creatinine Equation (2021)    Anion  gap 7 5 - 15    Comment: Performed at [facility], [address] [place], [place] [address]  CBC with Differential (Cancer Center Only)     Status: Abnormal   Collection Time: 10/25/23  1:57 PM  Result Value Ref Range   WBC Count 7.7 4.0 - 10.5 K/uL   RBC 4.57 4.22 - 5.81 MIL/uL   Hemoglobin 13.8 13.0 - 17.0 g/dL   HCT 82.9 56.2 - 13.0 %   MCV 88.6 80.0 - 100.0 fL   MCH 30.2 26.0 - 34.0 pg   MCHC 34.1 30.0 - 36.0 g/dL   RDW 86.5 (H) 78.4 - 69.6 %   Platelet Count 151 150 - 400 K/uL   nRBC 0.0 0.0 - 0.2 %   Neutrophils Relative % 68 %   Neutro Abs 5.3 1.7 - 7.7 K/uL   Lymphocytes Relative 14 %   Lymphs Abs 1.0 0.7 - 4.0 K/uL   Monocytes Relative 14 %   Monocytes Absolute 1.0 0.1 - 1.0 K/uL   Eosinophils Relative 2 %   Eosinophils Absolute 0.1 0.0 - 0.5 K/uL   Basophils Relative 2 %   Basophils Absolute 0.1 0.0 - 0.1 K/uL   Immature Granulocytes 0 %   Abs Immature Granulocytes 0.02 0.00 - 0.07 K/uL    Comment: Performed at [facility], [address] [place], [place] [address]  Basic metabolic panel     Status: Abnormal   Collection Time: 11/05/23 11:04 AM  Result Value Ref Range   Glucose 125 (H) 70 - 99 mg/dL   BUN 24 8 - 27 mg/dL   Creatinine, Ser 4.13 (H) 0.76 - 1.27 mg/dL   eGFR  34 (L) >59 mL/min/1.73   BUN/Creatinine Ratio 12 10 - 24   Sodium 141 134 - 144 mmol/L   Potassium 4.8 3.5 - 5.2 mmol/L   Chloride 107 (H) 96 - 106 mmol/L   CO2 22 20 - 29 mmol/L   Calcium 9.0 8.6 - 10.2 mg/dL  CBC     Status: None   Collection Time: 11/05/23 11:04 AM  Result Value Ref Range   WBC 10.1 3.4 - 10.8 x10E3/uL   RBC 4.64 4.14 - 5.80 x10E6/uL   Hemoglobin 13.6 13.0 - 17.7 g/dL   Hematocrit 16.1 09.6 - 51.0 %   MCV 90 79 - 97 fL   MCH 29.3 26.6 - 33.0 pg   MCHC 32.6 31.5 - 35.7 g/dL   RDW 04.5 40.9 - 81.1 %   Platelets 156 150 - 450 x10E3/uL  CMP (Cancer Center only)     Status: Abnormal   Collection Time: 11/22/23  1:46  PM  Result Value Ref Range   Sodium 141 135 - 145 mmol/L   Potassium 4.2 3.5 - 5.1 mmol/L   Chloride 112 (H) 98 - 111 mmol/L   CO2 23 22 - 32 mmol/L   Glucose, Bld 183 (H) 70 - 99 mg/dL    Comment: Glucose reference range applies only to samples taken after fasting for at least 8 hours.   BUN 30 (H) 8 - 23 mg/dL   Creatinine 9.14 (H) 7.82 - 1.24 mg/dL   Calcium 9.6 8.9 - 95.6 mg/dL   Total Protein 7.4 6.5 - 8.1 g/dL   Albumin 4.0 3.5 - 5.0 g/dL   AST 14 (L) 15 - 41 U/L   ALT 10 0 - 44 U/L   Alkaline Phosphatase 105 38 - 126 U/L   Total Bilirubin 3.1 (H) 0.0 - 1.2 mg/dL   GFR, Estimated 30 (L) >60 mL/min    Comment: (NOTE) Calculated using the CKD-EPI Creatinine Equation (2021)    Anion gap 6 5 - 15    Comment: Performed at [facility], [address], [place], [place] [address]  CBC with Differential (Cancer Center Only)     Status: Abnormal   Collection Time: 11/22/23  1:46 PM  Result Value Ref Range   WBC Count 8.5 4.0 - 10.5 K/uL   RBC 5.05 4.22 - 5.81 MIL/uL   Hemoglobin 14.6 13.0 - 17.0 g/dL   HCT 65.7 84.6 - 96.2 %   MCV 87.9 80.0 - 100.0 fL   MCH 28.9 26.0 - 34.0 pg   MCHC 32.9 30.0 - 36.0 g/dL   RDW 95.2 (H) 84.1 - 32.4 %   Platelet Count 171 150 - 400 K/uL   nRBC 0.0 0.0 - 0.2 %   Neutrophils Relative % 71 %   Neutro Abs 6.0 1.7 - 7.7 K/uL   Lymphocytes Relative 14 %   Lymphs Abs 1.2 0.7 - 4.0 K/uL   Monocytes Relative 12 %   Monocytes Absolute 1.1 (H) 0.1 - 1.0 K/uL   Eosinophils Relative 2 %   Eosinophils Absolute 0.1 0.0 - 0.5 K/uL   Basophils Relative 1 %   Basophils Absolute 0.1 0.0 - 0.1 K/uL   Immature Granulocytes 0 %   Abs Immature Granulocytes 0.03 0.00 - 0.07 K/uL    Comment: Performed at [facility], [address] [place], [place] [address]  Surgical pcr screen     Status: Abnormal   Collection Time: 11/29/23  7:10 AM   Specimen: Nasal  Mucosa; Nasal Swab  Result Value Ref Range   MRSA, PCR  POSITIVE (A) NEGATIVE    Comment: RESULT CALLED TO, READ BACK BY AND VERIFIED WITH: PATIENT DISCHARGED OR EXPIRED MARSHA ROBERTSON @1547  1.16.25 MDUGENSKE    Staphylococcus aureus POSITIVE (A) NEGATIVE    Comment: (NOTE) The Xpert SA Assay (FDA approved for NASAL specimens in patients 10 years of age and older), is one component of a comprehensive surveillance program. It is not intended to diagnose infection nor to guide or monitor treatment. Performed at [facility], [address], [place], [place] [address]   Glucose, capillary     Status: Abnormal   Collection Time: 11/29/23  7:32 AM  Result Value Ref Range   Glucose-Capillary 103 (H) 70 - 99 mg/dL    Comment: Glucose reference range applies only to samples taken after fasting for at least 8 hours.  Type and screen     Status: None   Collection Time: 11/29/23  8:10 AM  Result Value Ref Range   ABO/RH(D) A POS    Antibody Screen NEG    Sample Expiration      12/02/2023,2359 Performed at [facility], [address] [place], [place] [address]         Garner Nash, MD, MS

## 2023-12-05 ENCOUNTER — Other Ambulatory Visit: Payer: Self-pay | Admitting: Cardiology

## 2023-12-05 NOTE — Progress Notes (Signed)
[facility]                                     Cardiology Office Note:    Date:  12/10/2023   ID:  Adam Harris, DOB [birth year], MRN [id number]  PCP:  Loyola Mast, MD  [facility] Cardiologist:  Rollene Rotunda, MD  [facility] Electrophysiologist:  Nobie Putnam, MD   Referring MD: Loyola Mast, MD   Chief Complaint  Patient presents with   pre LAAO    History of Present Illness:    Adam Harris is a 78 y.o. 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    Ms. Adam Harris is followed by Dr. Antoine Poche for his cardiology care. He was recently seen last month and was noted to have stopped his Eliquis due to hematuria. He follows with urology and oncology due to Dx of urethral carcinoma in 2021 with recent notes indicating possible need to repeat cystoscopy with mention of potential palliative resection. He saw Dr. Mosetta Putt with oncology 12/12 and was cleared to proceed with LAAO.    He saw Dr. Jimmey Ralph 12/10 with recommendation to start low dose Eliquis 2.5mg  BID and proceed with implant. In pre-op evaluation, he was noted to have ongoing hematuria with large clots since starting Eliquis pre-op. Recommended that he stop Eliquis which was also confirmed by Dr. Jimmey Ralph and he was started on ASA and Plavix instead. On arrival for planned LAAO 1/16 he continued to have episodes of hematuria therefore his case was cancelled and rescheduled for 1/30.   He was seen by his PCP 1/21 with c/o urinary frequency. He was started on Keflex and UA/Cx performed which was positive for UTI. Cx remains pending.   Today he is here and reports he has been well with no further hematuria that he has noted. Discussed infusion appointment also scheduled for 1/30 which he states he will call to reschedule. Otherwise, denies chest pain, SOB, palpitations, LE edema,  orthopnea, PND, dizziness, or syncope. Denies bleeding in stool or urine.    Past Medical History:  Diagnosis Date   Abnormal radiologic findings on diagnostic imaging of renal pelvis, ureter, or bladder    bilateral ureter abnormalities   Anticoagulant long-term use    eliquis   Anxiety    Arthritis    CAD (coronary artery disease) cardiologist-- dr hochrein   NSTEMI 02-04-2014  per cardiac cath chronic occluded RCA w/ faint left-to-right collaterals and aneurysmal LCFx with sluggish coronary flow/   NSTEMI --11-21-2016 per cardiac cath occluded proximal RCA & mid to diastal CFX 100%, med rx. If that does not work, PTCA or CABG   Cancer [facility])    bladder and kidney   CKD (chronic kidney disease), stage III (HCC)    patient unaware   DOE (dyspnea on exertion)    Fatty liver    pt denies   Hematuria 02/2019   History of COVID-19 10/2019   History of non-ST elevation myocardial infarction (NSTEMI)    02-04-2014  and 11-21-2016  cardiac cath done both times ,  medically management   History of shingles 12/2017   slight pain and numbness still noted in the area   Hyperlipidemia    Hypertension    Insomnia    Myocardial  infarction [facility]) 2015   Persistent atrial fibrillation [facility])    cardiologsit-- dr hochrein   Thoracic aortic atherosclerosis (HCC)    Type 2 diabetes mellitus (HCC)    Urinary frequency     Past Surgical History:  Procedure Laterality Date   CARDIAC CATHETERIZATION N/A 11/21/2016   Procedure: Left Heart Cath and Coronary Angiography;  Surgeon: Lennette Bihari, MD;  Location: [facility] INV[facility];  Service: Cardiovascular;  Laterality: N/A;  pRCA 100% , ostial LAD 45%, OM3 80%, mCFX to dCFX 100% (AV groove), lateral OM3 50%   COLONOSCOPY     CYSTOSCOPY WITH BIOPSY N/A 08/12/2020   Procedure: CYSTOSCOPY WITH BLADDER BIOPSY AND TRANSURETHRAL RESECTION OF BLADDER TUMOR;  Surgeon: Heloise Purpura, MD;  Location: [facility] ORS;  Service: Urology;  Laterality: N/A;   CYSTOSCOPY  WITH BIOPSY N/A 11/10/2021   Procedure: CYSTOSCOPY WITH BLADDER BIOPSIES/ LEFT RETROGRADE;  Surgeon: Heloise Purpura, MD;  Location: [facility];  Service: Urology;  Laterality: N/A;   CYSTOSCOPY WITH BIOPSY Left 07/27/2022   Procedure: CYSTOSCOPY WITH  BLADDER BIOPSY, TRANSURETHRAL FULGERATION OF BLADDER, EXAM UNDER ANESTHESIA, LEFT RETROGRADE PYELOGRAM;  Surgeon: Heloise Purpura, MD;  Location: [facility];  Service: Urology;  Laterality: Left;   CYSTOSCOPY WITH BIOPSY N/A 04/23/2023   Procedure: CYSTOSCOPY WITH BLADDER AND PROSTATIC URETHRAL BIOPSIES;  Surgeon: Heloise Purpura, MD;  Location: [facility];  Service: Urology;  Laterality: N/A;  60 MINUTES NEEDED FOR CASE   CYSTOSCOPY WITH URETEROSCOPY AND STENT PLACEMENT Right 12/15/2019   Procedure: CYSTOSCOPY WITH RIGHT RETROGRADE/ RIGHT URETEROSCOPY/ BIOPSY;  Surgeon: Ihor Gully, MD;  Location: [facility];  Service: Urology;  Laterality: Right;   CYSTOSCOPY/RETROGRADE/URETEROSCOPY Bilateral 03/18/2018   Procedure: CYSTOSCOPY/RETROGRADE/URETEROSCOPY.;  Surgeon: Ihor Gully, MD;  Location: [facility];  Service: Urology;  Laterality: Bilateral;   EYE SURGERY Bilateral    cateract in January 2023   HYDROCELE EXCISION Left 07/27/2022   Procedure: HYDROCELE REPAIR;  Surgeon: Heloise Purpura, MD;  Location: [facility];  Service: Urology;  Laterality: Left;  GENERAL ANESTHESIA WITH PARALYSIS   KNEE ARTHROSCOPY Right    LEFT HEART CATHETERIZATION WITH CORONARY ANGIOGRAM N/A 02/04/2014   Procedure: LEFT HEART CATHETERIZATION WITH CORONARY ANGIOGRAM;  Surgeon: Iran Ouch, MD;  Location: [facility] CATH [facility];  Service: Cardiovascular;  Laterality: N/A;  severe one-vessel CAD, chronically occluded RCA with faint left-to-right collaterals;  aneurysmal LCFx with sluggish coronary flow;  normal LVSF w/ moderately elevated LVEDP (ostialOM2 20%, pOM3 20%, pD1 20%, mCFX 50%, diffuse 20% pCFX)   PROSTATE BIOPSY N/A 08/12/2020   Procedure: BIOPSY  TRANSRECTAL ULTRASONIC PROSTATE (TUBP);  Surgeon: Heloise Purpura, MD;  Location: [facility];  Service: Urology;  Laterality: N/A;   ROBOT ASSITED LAPAROSCOPIC NEPHROURETERECTOMY Right 03/22/2020   Procedure: XI ROBOT ASSITED LAPAROSCOPIC NEPHROURETERECTOMY;  Surgeon: Heloise Purpura, MD;  Location: [facility];  Service: Urology;  Laterality: Right;   TRANSTHORACIC ECHOCARDIOGRAM  11-22-2016   dr hochrein   ef 55-60%/ mild MR and TR/ moderate LAE   TRANSURETHRAL RESECTION OF BLADDER TUMOR N/A 02/10/2021   Procedure: TRANSURETHRAL RESECTION OF BLADDER TUMOR (TURBT)/ CYSTOSCOPY/ LEFT RETROGRADE;  Surgeon: Heloise Purpura, MD;  Location: [facility];  Service: Urology;  Laterality: N/A;  GENERAL ANESTHESIA WITH PARALYSIS    Current Medications: Current Meds  Medication Sig   amLODipine (NORVASC) 5 MG tablet TAKE 1 TABLET (5 MG TOTAL) BY MOUTH DAILY   aspirin EC 81 MG tablet Take 1 tablet (81 mg total) by mouth daily. Swallow whole.   cephALEXin (KEFLEX)  500 MG capsule Take 1 capsule (500 mg total) by mouth 2 (two) times daily for 10 days.   Cholecalciferol (VITAMIN D3) 1000 units CAPS Take 1,000 Units by mouth daily.   clopidogrel (PLAVIX) 75 MG tablet Take 1 tablet (75 mg total) by mouth daily.   furosemide (LASIX) 20 MG tablet Take 1 tablet (20 mg total) by mouth daily.   hydrOXYzine (VISTARIL) 25 MG capsule Take 1 capsule (25 mg total) by mouth every 8 (eight) hours as needed.   isosorbide mononitrate (IMDUR) 30 MG 24 hr tablet TAKE ONE TABLET BY MOUTH DAILY   metoprolol tartrate (LOPRESSOR) 25 MG tablet TAKE ONE TABLET BY MOUTH TWICE A DAY   nitroGLYCERIN (NITROSTAT) 0.4 MG SL tablet TAKE ONE TABLET UNDER THE TONGUE EVERY FIVE MINUTES FOR THREE DOSES AS NEEDED FOR CHEST PAIN, CALL 911 IF 2ND DOSE DOESN'T HELP   olmesartan (BENICAR) 40 MG tablet Take 40 mg by mouth daily.   oxybutynin (DITROPAN-XL) 5 MG 24 hr tablet TAKE 1 TABLET (5 MG TOTAL) BY MOUTH AT BEDTIME.   Pembrolizumab (KEYTRUDA IV) Inject into the  vein.   phenylephrine (NEO-SYNEPHRINE) 1 % nasal spray Place 1 drop into both nostrils every 6 (six) hours as needed for congestion.   rosuvastatin (CRESTOR) 40 MG tablet TAKE 1 TABLET (40 MG TOTAL) BY MOUTH EVERY MORNING. PLEASE KEEP SCHEDULED APPOINTMENT   sitaGLIPtin (JANUVIA) 25 MG tablet TAKE ONE TABLET BY MOUTH DAILY   sodium bicarbonate 650 MG tablet Take 650 mg by mouth 2 (two) times daily.   tamsulosin (FLOMAX) 0.4 MG CAPS capsule Take 1 capsule (0.4 mg total) by mouth daily.   traZODone (DESYREL) 50 MG tablet Take 0.5-1 tablets (25-50 mg total) by mouth at bedtime as needed for sleep.    Allergies:   Xarelto [rivaroxaban]   Social History   Socioeconomic History   Marital status: Married    Spouse name: Darl Pikes   Number of children: 3   Years of education: Not on file   Highest education level: Master's degree (e.g., MA, MS, MEng, MEd, MSW, MBA)  Occupational History   Occupation: Retired- Psychologist, occupational    Comment: [employer]  Tobacco Use   Smoking status: Former    Current packs/day: 0.00    Average packs/day: 0.3 packs/day for 4.0 years (1.0 ttl pk-yrs)    Types: Cigarettes    Start date: 1968    Quit date: 1972    Years since quitting: 53.1   Smokeless tobacco: Former    Types: Chew    Quit date: 1972  Vaping Use   Vaping status: Never Used  Substance and Sexual Activity   Alcohol use: No   Drug use: No   Sexual activity: Yes  Other Topics Concern   Not on file  Social History Narrative   Not on file   Social Drivers of Health   Financial Resource Strain: Low Risk  (08/06/2023)   Overall Financial Resource Strain (CARDIA)    Difficulty of Paying Living Expenses: Not hard at all  Food Insecurity: No Food Insecurity (08/06/2023)   Hunger Vital Sign    Worried About Running Out of Food in the Last Year: Never true    Ran Out of Food in the Last Year: Never true  Transportation Needs: No Transportation Needs (08/06/2023)   PRAPARE - Scientist, research (physical sciences) (Medical): No    Lack of Transportation (Non-Medical): No  Physical Activity: Inactive (08/06/2023)   Exercise Vital Sign  Days of Exercise per Week: 0 days    Minutes of Exercise per Session: 0 min  Stress: No Stress Concern Present (08/06/2023)   [employer] of Occupational Health - Occupational Stress Questionnaire    Feeling of Stress : Not at all  Social Connections: Moderately Integrated (08/06/2023)   Social Connection and Isolation Panel [NHANES]    Frequency of Communication with Friends and Family: More than three times a week    Frequency of Social Gatherings with Friends and Family: More than three times a week    Attends Religious Services: More than 4 times per year    Active Member of [employer] or Organizations: No    Attends Banker Meetings: Never    Marital Status: Married     Family History: The patient's family history includes Cancer in his mother; Hypertension in his mother. There is no history of Esophageal cancer, Pancreatic cancer, Stomach cancer, or Liver disease.  ROS:   Please see the history of present illness.    All other systems reviewed and are negative.  EKGs/Labs/Other Studies Reviewed:    The following studies were reviewed today:   Cardiac Studies & Procedures   CARDIAC CATHETERIZATION  CARDIAC CATHETERIZATION 11/21/2016  Narrative  Prox RCA lesion, 100 %stenosed.  Ost LAD lesion, 45 %stenosed.  3rd Mrg lesion, 80 %stenosed.  Mid Cx to Dist Cx lesion, 100 %stenosed.  Lat 3rd Mrg lesion, 50 %stenosed.  Multivessel CAD: The LAD had ostial 40 to less than 50% focal stenosis.  There were mild luminal irregularities in the LAD, which extended to the apex but otherwise was without additional significant obstructive disease.  The left circumflex vessel was a large dominant vessel that had a large region of aneurysmal dilation in the proximal to mid segment prior to bifurcating into a bifurcating marginal  branch and the  AV groove circumflex.  The OM1 vessel had 80% stenosis before giving rise to a branch and then had 50% stenosis in the inferior limb.  The AV groove circumflex was 99/100% occluded immediately after the large aneurysmal segment and  there was faint filling of a very large an additional aneurysmal segment with thrombus and very faint filling of the distal posterolateral branches.  There was faint collateralization of the distal posterolateral branches via the inferior branch of the obtuse marginal vessel.  The RCA was totally occluded proximally as had been previously demonstrated and there were faint bridging collaterals supplying the mid RCA which was a nondominant vessel distally.  LVEDP 18 mm Hg.  RECOMMENDATION: Angiographic findings were reviewed with Dr. Katrinka Blazing.  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  His RCA was totally occluded at that time.  His RCA is essentially unchanged.  His AV groove circumflex is now occluded, which most likely is the culprit lesion.  The patient had been inadequately been taking his anticoagulation.  Heparin will be resumed tonight with plans to resume oral anticoagulation.  Initially, the patient will be treated with increasing medical therapy.  He is completely pain-free.  He will be started on nitrates, in addition to increased beta blocker therapy.  He is not a candidate for stenting due to the significant aneurysmal dilatations throughout his vessels.  If he develops increasing symptomatology, consider possible PTCA and/or evaluation for surgical revascularization.  Findings Coronary Findings Diagnostic  Dominance: Left  Left Main The left main immediately bifurcated into an LAD and  a dominant left circumflex vessel.  Left Anterior Descending The LAD had ostial 40 to less than 50% focal stenosis.  There were mild luminal irregularities in the  LAD, which extended to the apex but otherwise was without additional significant obstructive disease  Left Circumflex The left circumflex vessel was a large dominant vessel that had a large region of aneurysmal dilation in the proximal to mid segment prior to bifurcating into a bifurcating marginal branch and the more distal AV groove circumflex.  The OM1 vessel had 80% stenosis before giving rise to a branch and then had 50% stenosis in the inferior limb.  The AV groove circumflex was 99/100% occluded immediately after the large aneurysmal segment and then there was faint filling of a very large an additional aneurysmal segment with thrombus and very faint filling of the distal posterolateral branches.  There was faint collateralization of the distal posterolateral branches via the inferior branch of the obtuse marginal vessel.  Third Obtuse Marginal Branch  Lateral Third Obtuse Marginal Branch  Right Coronary Artery The RCA was totally occluded proximally as had been previously demonstrated and there were faint bridging collaterals supplying the mid RCA which was a nondominant vessel distally.  Acute Marginal Branch Collaterals Acute Mrg filled by collaterals from Ost RCA.  Intervention  No interventions have been documented.    ECHOCARDIOGRAM  ECHOCARDIOGRAM COMPLETE 06/12/2022  Narrative ECHOCARDIOGRAM REPORT    Patient Name:   Deondrick Searls     Date of Exam: 06/12/2022 Medical Rec #:  [id number]     Height:       71.0 in Accession #:    [id number]    Weight:       224.0 lb Date of Birth:  Jun 23, 1946      BSA:          2.213 m Patient Age:    78 years      BP:           114/78 mmHg Patient Gender: M             HR:           67 bpm. Exam Location:  [address]  Procedure: 2D Echo, Cardiac Doppler and Color Doppler  Indications:    R60.0 Lower extremity swelling  History:        Patient has prior history of Echocardiogram examinations, most recent 11/22/2016. CHF, Previous  Myocardial Infarction and CAD, Arrythmias:Atrial Fibrillation; Risk Factors:Hypertension, Diabetes and Dyslipidemia. Exertional angina.  Sonographer:    Cathie Beams RCS Referring Phys: Rollene Rotunda  IMPRESSIONS   1. Left ventricular ejection fraction, by estimation, is 60 to 65%. The left ventricle has normal function. The left ventricle has no regional wall motion abnormalities. There is moderate left ventricular hypertrophy of the basal-septal segment. Left ventricular diastolic parameters are indeterminate. 2. Right ventricular systolic function is normal. The right ventricular size is normal. There is mildly elevated pulmonary artery systolic pressure. 3. Left atrial size was mildly dilated. 4. The mitral valve is normal in structure. Mild mitral valve regurgitation. No evidence of mitral stenosis. 5. The aortic valve is tricuspid. There is mild calcification of the aortic valve. Aortic valve regurgitation is not visualized. Aortic valve sclerosis is present, with no evidence of aortic valve stenosis. 6. The inferior vena cava is normal in size with greater than 50% respiratory variability, suggesting right atrial pressure of 3 mmHg.  FINDINGS Left Ventricle: Left ventricular ejection fraction, by estimation, is 60 to 65%. The left ventricle has  normal function. The left ventricle has no regional wall motion abnormalities. The left ventricular internal cavity size was normal in size. There is moderate left ventricular hypertrophy of the basal-septal segment. Left ventricular diastolic parameters are indeterminate.  Right Ventricle: The right ventricular size is normal. No increase in right ventricular wall thickness. Right ventricular systolic function is normal. There is mildly elevated pulmonary artery systolic pressure. The tricuspid regurgitant velocity is 3.18 m/s, and with an assumed right atrial pressure of 3 mmHg, the estimated right ventricular systolic pressure is 43.4  mmHg.  Left Atrium: Left atrial size was mildly dilated.  Right Atrium: Right atrial size was normal in size.  Pericardium: There is no evidence of pericardial effusion.  Mitral Valve: The mitral valve is normal in structure. Mild mitral valve regurgitation. No evidence of mitral valve stenosis.  Tricuspid Valve: The tricuspid valve is normal in structure. Tricuspid valve regurgitation is mild . No evidence of tricuspid stenosis.  Aortic Valve: The aortic valve is tricuspid. There is mild calcification of the aortic valve. Aortic valve regurgitation is not visualized. Aortic valve sclerosis is present, with no evidence of aortic valve stenosis.  Pulmonic Valve: The pulmonic valve was normal in structure. Pulmonic valve regurgitation is not visualized. No evidence of pulmonic stenosis.  Aorta: The aortic root is normal in size and structure.  Venous: The inferior vena cava is normal in size with greater than 50% respiratory variability, suggesting right atrial pressure of 3 mmHg.  IAS/Shunts: No atrial level shunt detected by color flow Doppler.   LEFT VENTRICLE PLAX 2D LVIDd:         3.60 cm LVIDs:         1.70 cm LV PW:         1.00 cm LV IVS:        1.50 cm LVOT diam:     2.00 cm LV SV:         72 LV SV Index:   32 LVOT Area:     3.14 cm   RIGHT VENTRICLE RV Basal diam:  3.00 cm RV S prime:     15.80 cm/s TAPSE (M-mode): 1.6 cm RVSP:           43.4 mmHg  LEFT ATRIUM             Index        RIGHT ATRIUM           Index LA diam:        5.30 cm 2.40 cm/m   RA Pressure: 3.00 mmHg LA Vol (A2C):   51.3 ml 23.19 ml/m  RA Area:     17.70 cm LA Vol (A4C):   89.3 ml 40.36 ml/m  RA Volume:   43.00 ml  19.43 ml/m LA Biplane Vol: 68.0 ml 30.73 ml/m AORTIC VALVE LVOT Vmax:   107.47 cm/s LVOT Vmean:  72.133 cm/s LVOT VTI:    0.228 m  AORTA Ao Root diam: 3.60 cm Ao Asc diam:  3.90 cm  TRICUSPID VALVE TR Peak grad:   40.4 mmHg TR Vmax:        318.00 cm/s Estimated  RAP:  3.00 mmHg RVSP:           43.4 mmHg  SHUNTS Systemic VTI:  0.23 m Systemic Diam: 2.00 cm  Donato Schultz MD Electronically signed by Donato Schultz MD Signature Date/Time: 06/12/2022/2:29:34 PM    Final             EKG:  EKG is  ordered today.  The ekg ordered today demonstrates AF with HR 67bpm  Recent Labs: 08/03/2023: TSH 4.858 11/22/2023: ALT 10; BUN 30; Creatinine 2.19; Hemoglobin 14.6; Platelet Count 171; Potassium 4.2; Sodium 141   Recent Lipid Panel    Component Value Date/Time   CHOL 89 (L) 04/03/2023 1351   TRIG 49 04/03/2023 1351   HDL 32 (L) 04/03/2023 1351   CHOLHDL 2.8 04/03/2023 1351   CHOLHDL 3 05/02/2022 1105   VLDL 10.2 05/02/2022 1105   LDLCALC 45 04/03/2023 1351   Risk Assessment/Calculations:    CHA2DS2-VASc Score = 6 [CHF History: 1, HTN History: 1, Diabetes History: 1, Stroke History: 0, Vascular Disease History: 1, Age Score: 2, Gender Score: 0].  Therefore, the patient's annual risk of stroke is 9.7 %.       HAS-BLED score 3 Hypertension (Uncontrolled in 30 days)  No  Abnormal renal and liver function (Dialysis, transplant, Cr >2.26 mg/dL /Cirrhosis or Bilirubin >2x Normal or AST/ALT/AP >3x Normal) Yes  Stroke No  Bleeding Yes  Labile INR (Unstable/high INR) No  Elderly (>65) Yes  Drugs or alcohol (>= 8 drinks/week, anti-plt or NSAID) No   Physical Exam:    VS:  BP 130/60   Pulse 67   Ht 5\' 11"  (1.803 m)   Wt 227 lb 3.2 oz (103.1 kg)   SpO2 96%   BMI 31.69 kg/m     Wt Readings from Last 3 Encounters:  12/10/23 227 lb 3.2 oz (103.1 kg)  12/04/23 224 lb 12.8 oz (102 kg)  11/29/23 223 lb (101.2 kg)    General: Well developed, well nourished, NAD Lungs:Clear to ausculation bilaterally. No wheezes, rales, or rhonchi. Breathing is unlabored. Cardiovascular: Irregular. No murmurs Extremities: No edema. Neuro: Alert and oriented. No focal deficits. No facial asymmetry. MAE spontaneously. Psych: Responds to questions appropriately with  normal affect.    ASSESSMENT/PLAN:    Permanent atrial fibrillation: Referred for LAAO due to recurrent hematuria with anticoagulation. CT deferred due to renal disease. Attempted to restart Eliquis 2.5mg  BID however reports recurrent hematuria with large clots. Eliquis stopped and started on DAPT with ASA and Plavix. LAAO rescheduled for 1/30 after canceling case due to recurrent hematuria. New instruction letter reviewed with understanding. No need to repeat labs today. Follow up post implant.    Hematuria: Reports no further hematuria since LAAO cancellation. Rescheduled for 1/30   CKD stage IV: Baseline Cr appears to be in the 2.3 range. CT avoided due to renal disease. Anticipate post implant TEE given this.    CAD: Known CTO of the RCA with collaterals. No anginal symptoms. Continue current regimen. Will plan indefinite ASA after 6 months post implant therapy with DAPT.    HFpEF: Appears euvolemic on exam today with NYHA class I symptoms. Continue Lasix 20mg  every day. Weight stable.    HTN: BP appears to be within goal limits on recent office visits. No change today.    I spent 20 minutes caring for this patient today including face-to-face discussions, ordering and reviewing labs, reviewing records from [facility] and other outside facilities, documenting in the record, and arranging for follow up.     Medication Adjustments/Labs and Tests Ordered: Current medicines are reviewed at length with the patient today.  Concerns regarding medicines are outlined above.  Orders Placed This Encounter  Procedures   EKG 12-Lead   No orders of the defined types were placed in this encounter.   Patient Instructions  Medication Instructions:  Your physician recommends that  you continue on your current medications as directed. Please refer to the Current Medication list given to you today.  *If you need a refill on your cardiac medications before your next appointment, please call your  pharmacy*   Lab Work: TODAY: BMET & CBC  If you have labs (blood work) drawn today and your tests are completely normal, you will receive your results only by: MyChart Message (if you have MyChart) OR A paper copy in the mail If you have any lab test that is abnormal or we need to change your treatment, we will call you to review the results.   Testing/Procedures: None ordered   Follow-Up: At [facility], you and your health needs are our priority.  As part of our continuing mission to provide you with exceptional heart care, we have created designated Provider Care Teams.  These Care Teams include your primary Cardiologist (physician) and Advanced Practice Providers (APPs -  Physician Assistants and Nurse Practitioners) who all work together to provide you with the care you need, when you need it.  We recommend signing up for the patient portal called "MyChart".  Sign up information is provided on this After Visit Summary.  MyChart is used to connect with patients for Virtual Visits (Telemedicine).  Patients are able to view lab/test results, encounter notes, upcoming appointments, etc.  Non-urgent messages can be sent to your provider as well.   To learn more about what you can do with MyChart, go to [email or web address].    Your next appointment:   Will call   Provider:   Georgie Chard, NP or Carlean Jews, PA-C    Other Instructions   1st Floor: - Lobby - Registration  - Pharmacy  - Lab - Cafe  2nd Floor: - PV Lab - Diagnostic Testing (echo, CT, nuclear med)  3rd Floor: - Vacant  4th Floor: - TCTS (cardiothoracic surgery) - AFib Clinic - Structural Heart Clinic - Vascular Surgery  - Vascular Ultrasound  5th Floor: - HeartCare Cardiology (general and EP) - Clinical Pharmacy for coumadin, hypertension, lipid, weight-loss medications, and med management appointments    Valet parking services will be available as well.            Signed, Georgie Chard, NP  12/10/2023 11:01 AM    [facility]

## 2023-12-06 ENCOUNTER — Encounter: Payer: Self-pay | Admitting: Family Medicine

## 2023-12-06 LAB — URINE CULTURE
MICRO NUMBER:: 15982517
SPECIMEN QUALITY:: ADEQUATE

## 2023-12-06 LAB — URINALYSIS W MICROSCOPIC + REFLEX CULTURE
Bilirubin Urine: NEGATIVE
Glucose, UA: NEGATIVE
Hyaline Cast: NONE SEEN /[LPF]
Ketones, ur: NEGATIVE
Nitrites, Initial: NEGATIVE
RBC / HPF: >=60 /[HPF] — AB (ref 0–2)
Specific Gravity, Urine: 1.016 (ref 1.001–1.035)
Squamous Epithelial / HPF: NONE SEEN /[HPF] (ref <=5)
WBC, UA: >=60 /[HPF] — AB (ref 0–5)
pH: 6.5 (ref 5.0–8.0)

## 2023-12-06 LAB — CULTURE INDICATED

## 2023-12-07 ENCOUNTER — Telehealth: Payer: Self-pay | Admitting: Cardiology

## 2023-12-07 NOTE — Telephone Encounter (Signed)
Pt reports that he is scheduled for a Watchman on 12/13/23 with Dr Jimmey Ralph, but is also scheduled for Oncology Infusion on the same day. He just does not know exactly what to do.  He has an appt on Monday 12/10/23 at [facility] with Structural Heart at 09:05.  Instructed him to go to the appointment on Monday, but I will also send a message to the provider to let them know.

## 2023-12-07 NOTE — Telephone Encounter (Signed)
Patient is requesting call back to discuss upcoming appts scheduled. Please advise.

## 2023-12-10 ENCOUNTER — Telehealth: Payer: Self-pay

## 2023-12-10 ENCOUNTER — Other Ambulatory Visit: Payer: Self-pay

## 2023-12-10 ENCOUNTER — Ambulatory Visit: Payer: Medicare PPO | Attending: Cardiology | Admitting: Cardiology

## 2023-12-10 VITALS — BP 130/60 | HR 67 | Ht 71.0 in | Wt 227.2 lb

## 2023-12-10 DIAGNOSIS — Z01818 Encounter for other preprocedural examination: Secondary | ICD-10-CM

## 2023-12-10 DIAGNOSIS — I25119 Atherosclerotic heart disease of native coronary artery with unspecified angina pectoris: Secondary | ICD-10-CM

## 2023-12-10 DIAGNOSIS — R310 Gross hematuria: Secondary | ICD-10-CM | POA: Diagnosis not present

## 2023-12-10 DIAGNOSIS — I5032 Chronic diastolic (congestive) heart failure: Secondary | ICD-10-CM

## 2023-12-10 DIAGNOSIS — N184 Chronic kidney disease, stage 4 (severe): Secondary | ICD-10-CM

## 2023-12-10 DIAGNOSIS — I10 Essential (primary) hypertension: Secondary | ICD-10-CM | POA: Diagnosis not present

## 2023-12-10 DIAGNOSIS — I4821 Permanent atrial fibrillation: Secondary | ICD-10-CM

## 2023-12-10 DIAGNOSIS — I482 Chronic atrial fibrillation, unspecified: Secondary | ICD-10-CM | POA: Diagnosis not present

## 2023-12-10 NOTE — Patient Instructions (Signed)
Medication Instructions:  Your physician recommends that you continue on your current medications as directed. Please refer to the Current Medication list given to you today.  *If you need a refill on your cardiac medications before your next appointment, please call your pharmacy*   Lab Work: TODAY: BMET & CBC  If you have labs (blood work) drawn today and your tests are completely normal, you will receive your results only by: MyChart Message (if you have MyChart) OR A paper copy in the mail If you have any lab test that is abnormal or we need to change your treatment, we will call you to review the results.   Testing/Procedures: None ordered   Follow-Up: At [facility], you and your health needs are our priority.  As part of our continuing mission to provide you with exceptional heart care, we have created designated Provider Care Teams.  These Care Teams include your primary Cardiologist (physician) and Advanced Practice Providers (APPs -  Physician Assistants and Nurse Practitioners) who all work together to provide you with the care you need, when you need it.  We recommend signing up for the patient portal called "MyChart".  Sign up information is provided on this After Visit Summary.  MyChart is used to connect with patients for Virtual Visits (Telemedicine).  Patients are able to view lab/test results, encounter notes, upcoming appointments, etc.  Non-urgent messages can be sent to your provider as well.   To learn more about what you can do with MyChart, go to [email or web address].    Your next appointment:   Will call   Provider:   Georgie Chard, NP or Carlean Jews, PA-C    Other Instructions   1st Floor: - Lobby - Registration  - Pharmacy  - Lab - Cafe  2nd Floor: - PV Lab - Diagnostic Testing (echo, CT, nuclear med)  3rd Floor: - Vacant  4th Floor: - TCTS (cardiothoracic surgery) - AFib Clinic - Structural Heart Clinic - Vascular Surgery   - Vascular Ultrasound  5th Floor: - HeartCare Cardiology (general and EP) - Clinical Pharmacy for coumadin, hypertension, lipid, weight-loss medications, and med management appointments    Valet parking services will be available as well.

## 2023-12-10 NOTE — Telephone Encounter (Signed)
Confirmed with Georgie Chard she spoke with the patient today and he will reschedule his 1/30 infusion. She also confirmed he has no hematuria. Will keep 12/13/2023 Watchman implant scheduled as planned.

## 2023-12-10 NOTE — Telephone Encounter (Signed)
The patient is currently in the office. Adam Harris contacted to speak with the patient to reschedule his infusion.

## 2023-12-10 NOTE — Telephone Encounter (Signed)
Pt's wife called stating the pt is scheduled on Thursday 12/13/2023 to have a watchman placed and also scheduled for his chemo infusion.  Pt's wife if requesting if the pt could be rescheduled for his chemo infusion that day.  Sent message to Dr. Mosetta Putt and her Team along to Dr. Chase Picket.

## 2023-12-11 NOTE — Telephone Encounter (Signed)
The patient's 12/13/2023 infusion has been rescheduled to 12/19/2023.

## 2023-12-13 ENCOUNTER — Other Ambulatory Visit: Payer: Self-pay

## 2023-12-13 ENCOUNTER — Inpatient Hospital Stay: Payer: Medicare PPO

## 2023-12-13 ENCOUNTER — Encounter (HOSPITAL_COMMUNITY): Payer: Self-pay | Admitting: Cardiology

## 2023-12-13 ENCOUNTER — Telehealth: Payer: Self-pay

## 2023-12-13 ENCOUNTER — Encounter (HOSPITAL_COMMUNITY)
Admission: RE | Disposition: A | Discharge status: Home / Self Care | Payer: Medicare PPO | Source: Home / Self Care | Attending: Cardiology

## 2023-12-13 ENCOUNTER — Inpatient Hospital Stay (HOSPITAL_BASED_OUTPATIENT_CLINIC_OR_DEPARTMENT_OTHER): Payer: Medicare PPO | Admitting: Anesthesiology

## 2023-12-13 ENCOUNTER — Inpatient Hospital Stay: Payer: Medicare PPO | Admitting: Nurse Practitioner

## 2023-12-13 ENCOUNTER — Inpatient Hospital Stay (HOSPITAL_COMMUNITY)
Admission: RE | Admit: 2023-12-13 | Discharge: 2023-12-13 | DRG: 274 | Disposition: A | Discharge status: Home / Self Care | Payer: Medicare PPO | Attending: Cardiology | Admitting: Cardiology

## 2023-12-13 ENCOUNTER — Inpatient Hospital Stay (HOSPITAL_COMMUNITY): Payer: Medicare PPO | Admitting: Anesthesiology

## 2023-12-13 ENCOUNTER — Inpatient Hospital Stay (HOSPITAL_COMMUNITY): Payer: Medicare PPO

## 2023-12-13 DIAGNOSIS — I110 Hypertensive heart disease with heart failure: Secondary | ICD-10-CM | POA: Diagnosis not present

## 2023-12-13 DIAGNOSIS — N184 Chronic kidney disease, stage 4 (severe): Secondary | ICD-10-CM | POA: Diagnosis present

## 2023-12-13 DIAGNOSIS — Z95818 Presence of other cardiac implants and grafts: Secondary | ICD-10-CM

## 2023-12-13 DIAGNOSIS — Z888 Allergy status to other drugs, medicaments and biological substances status: Secondary | ICD-10-CM

## 2023-12-13 DIAGNOSIS — I4821 Permanent atrial fibrillation: Secondary | ICD-10-CM | POA: Diagnosis not present

## 2023-12-13 DIAGNOSIS — I2510 Atherosclerotic heart disease of native coronary artery without angina pectoris: Secondary | ICD-10-CM | POA: Diagnosis present

## 2023-12-13 DIAGNOSIS — N390 Urinary tract infection, site not specified: Secondary | ICD-10-CM | POA: Diagnosis present

## 2023-12-13 DIAGNOSIS — Z006 Encounter for examination for normal comparison and control in clinical research program: Secondary | ICD-10-CM | POA: Diagnosis not present

## 2023-12-13 DIAGNOSIS — I252 Old myocardial infarction: Secondary | ICD-10-CM

## 2023-12-13 DIAGNOSIS — I4891 Unspecified atrial fibrillation: Secondary | ICD-10-CM | POA: Diagnosis present

## 2023-12-13 DIAGNOSIS — E1159 Type 2 diabetes mellitus with other circulatory complications: Secondary | ICD-10-CM | POA: Diagnosis present

## 2023-12-13 DIAGNOSIS — I480 Paroxysmal atrial fibrillation: Secondary | ICD-10-CM

## 2023-12-13 DIAGNOSIS — R310 Gross hematuria: Secondary | ICD-10-CM | POA: Diagnosis present

## 2023-12-13 DIAGNOSIS — I10 Essential (primary) hypertension: Secondary | ICD-10-CM | POA: Diagnosis present

## 2023-12-13 DIAGNOSIS — Z87448 Personal history of other diseases of urinary system: Secondary | ICD-10-CM | POA: Diagnosis present

## 2023-12-13 DIAGNOSIS — E785 Hyperlipidemia, unspecified: Secondary | ICD-10-CM | POA: Diagnosis not present

## 2023-12-13 DIAGNOSIS — C689 Malignant neoplasm of urinary organ, unspecified: Secondary | ICD-10-CM | POA: Diagnosis present

## 2023-12-13 DIAGNOSIS — I5032 Chronic diastolic (congestive) heart failure: Secondary | ICD-10-CM | POA: Diagnosis not present

## 2023-12-13 DIAGNOSIS — C680 Malignant neoplasm of urethra: Secondary | ICD-10-CM | POA: Diagnosis present

## 2023-12-13 DIAGNOSIS — I130 Hypertensive heart and chronic kidney disease with heart failure and stage 1 through stage 4 chronic kidney disease, or unspecified chronic kidney disease: Secondary | ICD-10-CM | POA: Diagnosis present

## 2023-12-13 DIAGNOSIS — D6869 Other thrombophilia: Secondary | ICD-10-CM | POA: Diagnosis not present

## 2023-12-13 DIAGNOSIS — E1122 Type 2 diabetes mellitus with diabetic chronic kidney disease: Secondary | ICD-10-CM | POA: Diagnosis not present

## 2023-12-13 DIAGNOSIS — I2582 Chronic total occlusion of coronary artery: Secondary | ICD-10-CM | POA: Diagnosis present

## 2023-12-13 DIAGNOSIS — I509 Heart failure, unspecified: Secondary | ICD-10-CM

## 2023-12-13 HISTORY — PX: LEFT ATRIAL APPENDAGE OCCLUSION: EP1229

## 2023-12-13 HISTORY — PX: TRANSESOPHAGEAL ECHOCARDIOGRAM (CATH LAB): EP1270

## 2023-12-13 HISTORY — DX: Presence of other cardiac implants and grafts: Z95.818

## 2023-12-13 LAB — TYPE AND SCREEN
ABO/RH(D): A POS
Antibody Screen: NEGATIVE

## 2023-12-13 LAB — GLUCOSE, CAPILLARY
Glucose-Capillary: 128 mg/dL — ABNORMAL HIGH (ref 70–99)
Glucose-Capillary: 112 mg/dL — ABNORMAL HIGH (ref 70–99)

## 2023-12-13 LAB — ECHO TEE

## 2023-12-13 LAB — POCT ACTIVATED CLOTTING TIME
Activated Clotting Time: 273 s
Activated Clotting Time: 285 s

## 2023-12-13 SURGERY — LEFT ATRIAL APPENDAGE OCCLUSION
Anesthesia: General

## 2023-12-13 MED ORDER — PROTAMINE SULFATE 10 MG/ML IV SOLN
INTRAVENOUS | Status: DC | PRN
  Administered 2023-12-13: 10 mg via INTRAVENOUS
  Administered 2023-12-13: 25 mg via INTRAVENOUS

## 2023-12-13 MED ORDER — SODIUM CHLORIDE 0.9% FLUSH: 3.0000 mL | Freq: Two times a day (BID) | INTRAVENOUS | Status: DC

## 2023-12-13 MED ORDER — FENTANYL CITRATE (PF) 100 MCG/2ML IJ SOLN
INTRAMUSCULAR | Status: DC | PRN
  Administered 2023-12-13: 100 ug via INTRAVENOUS

## 2023-12-13 MED ORDER — VANCOMYCIN HCL 1000 MG IV SOLR
INTRAVENOUS | Status: DC | PRN
  Administered 2023-12-13: 1000 mg via INTRAVENOUS

## 2023-12-13 MED ORDER — CHLORHEXIDINE GLUCONATE 0.12 % MT SOLN
OROMUCOSAL | Status: AC
  Filled 2023-12-13: qty 15

## 2023-12-13 MED ORDER — VANCOMYCIN HCL IN DEXTROSE 1-5 GM/200ML-% IV SOLN
INTRAVENOUS | Status: AC
  Filled 2023-12-13: qty 200

## 2023-12-13 MED ORDER — HEPARIN SODIUM (PORCINE) 1000 UNIT/ML IJ SOLN
INTRAMUSCULAR | Status: DC | PRN
  Administered 2023-12-13: 4000 [IU] via INTRAVENOUS
  Administered 2023-12-13: 15000 [IU] via INTRAVENOUS

## 2023-12-13 MED ORDER — HEPARIN (PORCINE) IN NACL 2000-0.9 UNIT/L-% IV SOLN
INTRAVENOUS | Status: DC | PRN
  Administered 2023-12-13: 1000 mL

## 2023-12-13 MED ORDER — PROPOFOL 10 MG/ML IV BOLUS
INTRAVENOUS | Status: DC | PRN
  Administered 2023-12-13: 100 mg via INTRAVENOUS
  Administered 2023-12-13: 10 mg via INTRAVENOUS

## 2023-12-13 MED ORDER — LIDOCAINE 2% (20 MG/ML) 5 ML SYRINGE
INTRAMUSCULAR | Status: DC | PRN
  Administered 2023-12-13: 40 mg via INTRAVENOUS

## 2023-12-13 MED ORDER — LACTATED RINGERS IV SOLN: INTRAVENOUS | Status: DC | PRN

## 2023-12-13 MED ORDER — PHENYLEPHRINE 80 MCG/ML (10ML) SYRINGE FOR IV PUSH (FOR BLOOD PRESSURE SUPPORT)
PREFILLED_SYRINGE | INTRAVENOUS | Status: DC | PRN
  Administered 2023-12-13: 160 ug via INTRAVENOUS
  Administered 2023-12-13: 80 ug via INTRAVENOUS

## 2023-12-13 MED ORDER — ONDANSETRON HCL 4 MG/2ML IJ SOLN
INTRAMUSCULAR | Status: DC | PRN
  Administered 2023-12-13: 4 mg via INTRAVENOUS

## 2023-12-13 MED ORDER — FENTANYL CITRATE (PF) 100 MCG/2ML IJ SOLN
INTRAMUSCULAR | Status: AC
  Filled 2023-12-13: qty 2

## 2023-12-13 MED ORDER — ONDANSETRON HCL 4 MG/2ML IJ SOLN: 4.0000 mg | Freq: Four times a day (QID) | INTRAMUSCULAR | Status: DC | PRN

## 2023-12-13 MED ORDER — SODIUM CHLORIDE 0.9 % IV SOLN: INTRAVENOUS | Status: DC

## 2023-12-13 MED ORDER — SODIUM CHLORIDE 0.9% FLUSH: 3.0000 mL | INTRAVENOUS | Status: DC | PRN

## 2023-12-13 MED ORDER — MUPIROCIN 2 % EX OINT: 1.0000 | TOPICAL_OINTMENT | Freq: Two times a day (BID) | CUTANEOUS | Status: DC

## 2023-12-13 MED ORDER — CHLORHEXIDINE GLUCONATE CLOTH 2 % EX PADS: 6.0000 | MEDICATED_PAD | Freq: Every day | CUTANEOUS | Status: DC

## 2023-12-13 MED ORDER — PHENYLEPHRINE HCL-NACL 20-0.9 MG/250ML-% IV SOLN
INTRAVENOUS | Status: DC | PRN
  Administered 2023-12-13: 40 ug/min via INTRAVENOUS

## 2023-12-13 MED ORDER — CHLORHEXIDINE GLUCONATE 4 % EX SOLN: Freq: Once | CUTANEOUS | Status: DC

## 2023-12-13 MED ORDER — INSULIN ASPART 100 UNIT/ML IJ SOLN: 0.0000 [IU] | INTRAMUSCULAR | Status: DC | PRN

## 2023-12-13 MED ORDER — CEFAZOLIN SODIUM-DEXTROSE 2-4 GM/100ML-% IV SOLN
2.0000 g | INTRAVENOUS | Status: AC
  Administered 2023-12-13: 2 g via INTRAVENOUS
  Filled 2023-12-13: qty 100

## 2023-12-13 MED ORDER — ACETAMINOPHEN 325 MG PO TABS: 650.0000 mg | ORAL_TABLET | ORAL | Status: DC | PRN

## 2023-12-13 MED ORDER — SUGAMMADEX SODIUM 200 MG/2ML IV SOLN
INTRAVENOUS | Status: DC | PRN
  Administered 2023-12-13: 200 mg via INTRAVENOUS

## 2023-12-13 MED ORDER — ROCURONIUM BROMIDE 10 MG/ML (PF) SYRINGE
PREFILLED_SYRINGE | INTRAVENOUS | Status: DC | PRN
  Administered 2023-12-13: 10 mg via INTRAVENOUS
  Administered 2023-12-13: 70 mg via INTRAVENOUS

## 2023-12-13 MED ORDER — SODIUM CHLORIDE 0.9% FLUSH
3.0000 mL | INTRAVENOUS | Status: DC | PRN
Start: 2023-12-13 — End: 2023-12-13

## 2023-12-13 MED ORDER — PROPOFOL 500 MG/50ML IV EMUL
INTRAVENOUS | Status: DC | PRN
  Administered 2023-12-13: 85 ug/kg/min via INTRAVENOUS

## 2023-12-13 SURGICAL SUPPLY — 20 items
CATH INFINITI 5FR ANG PIGTAIL (CATHETERS) IMPLANT
CLOSURE PERCLOSE PROSTYLE (VASCULAR PRODUCTS) IMPLANT
DEVICE WATCHMAN FLX PRO PROC (KITS) IMPLANT
DEVICE WATCHMAN FXD CRV PROC (KITS) IMPLANT
DILATOR VESSEL 38 20CM 16FR (INTRODUCER) IMPLANT
KIT HEART LEFT (KITS) ×1 IMPLANT
KIT SHEA VERSACROSS LAAC CONNE (KITS) IMPLANT
PACK CARDIAC CATHETERIZATION (CUSTOM PROCEDURE TRAY) ×1 IMPLANT
PAD DEFIB RADIO PHYSIO CONN (PAD) ×1 IMPLANT
SHEATH PERFORMER 16FR 30 (SHEATH) IMPLANT
SHEATH PINNACLE 8F 10CM (SHEATH) IMPLANT
SHEATH PROBE COVER 6X72 (BAG) ×1 IMPLANT
SHIELD RADPAD SCOOP 12X17 (MISCELLANEOUS) ×1 IMPLANT
SYS WATCHMAN FXD DBL (SHEATH) ×1
SYSTEM WATCHMAN FXD DBL (SHEATH) IMPLANT
TRANSDUCER W/STOPCOCK (MISCELLANEOUS) ×1 IMPLANT
TUBING CIL FLEX 10 FLL-RA (TUBING) ×1 IMPLANT
WATCHMAN FLX PRO 27 (Prosthesis & Implant Heart) IMPLANT
WATCHMAN FLX PRO PROCEDURE (KITS) ×1 IMPLANT
WATCHMAN FXD CRV SYS PROCEDURE (KITS) ×1 IMPLANT

## 2023-12-13 NOTE — Discharge Summary (Signed)
[facility]    Patient ID: Adam Harris,  MRN: [id number], DOB/AGE: 1946-07-08 78 y.o.  Admit date: 12/13/2023 Discharge date: 12/13/2023  Primary Care Physician: Loyola Mast, MD  Primary Cardiologist: Rollene Rotunda, MD  Electrophysiologist: Nobie Putnam, MD  Primary Discharge Diagnosis:  Permanent Atrial Fibrillation Poor candidacy for long term anticoagulation due to  recurrent hematuria.  Procedures This Admission:  Transeptal Puncture Intra-procedural TEE which showed no LAA thrombus Left atrial appendage occlusive device placement on 12/13/23 by Dr. Jimmey Ralph.   This study demonstrated:  1.Successful implantation of a 27mm WATCHMAN Flx Pro left atrial appendage occlusive device    2. TEE demonstrating no LAA thrombus 3. No early apparent complications.    Post Implant Anticoagulation Strategy: Continue dual anti-platelet therapy with aspirin and clopidogrel for 6 months.    Brief HPI: Adam Harris is a 78 y.o. 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    [name] is followed by Dr. Antoine Poche for his cardiology care. He was recently seen last month and was noted to have stopped his Eliquis due to hematuria. He follows with urology and oncology due to Dx of urethral carcinoma in 2021 with recent notes indicating possible need to repeat cystoscopy with mention of potential palliative resection. He saw Dr. Mosetta Putt with oncology 12/12 and was cleared to proceed with LAAO.    He saw Dr. Jimmey Ralph 12/10 with recommendation to start low dose Eliquis 2.5mg  BID and proceed with implant. In pre-op evaluation, he was noted to have ongoing hematuria with large clots since starting Eliquis pre-op. Recommended that he stop Eliquis which was also confirmed by Dr. Jimmey Ralph and he was  started on ASA and Plavix instead. On arrival for planned LAAO 1/16 he continued to have episodes of hematuria therefore his case was cancelled and rescheduled for 1/30.    He was seen by his PCP 1/21 with c/o urinary frequency. He was started on Keflex and UA/Cx performed which was positive for UTI. Cx remains pending.   Hospital Course:  The patient was admitted and underwent left atrial appendage occlusive device placement with 27mm Watchman FLX Pro device.  He was monitored in the post procedure setting and has done very well with no concerns. Given this, he is being considered for same day discharge later today. Groin site has been stable without evidence of hematoma or bleeding. Wound care and restrictions were reviewed with the patient. The patient has been scheduled for post procedure follow up with a provider in approximately 1 month. He will restart DAPT with ASA and Plavix and continue this for 6 months post implant. He will require dental SBE during this time and should refrain from dental work or cleanings for at least 30 days post implant. SBE to be RXd at follow up. A repeat TEE will be performed in approximately 60 days to ensure proper seal of the device given poor renal function.   Physical Exam: Vitals:   12/13/23 1300 12/13/23 1345 12/13/23 1347 12/13/23 1400  BP: 119/83   (!) 132/100  Pulse: 60 (!) 59 68 67  Resp: (!) 21 12  14   Temp:      TempSrc:      SpO2: 100% 98% 96% 92%  Weight:      Height:  Labs:   Lab Results  Component Value Date   WBC 8.5 11/22/2023   HGB 14.6 11/22/2023   HCT 44.4 11/22/2023   MCV 87.9 11/22/2023   PLT 171 11/22/2023   No results for input(s): "NA", "K", "CL", "CO2", "BUN", "CREATININE", "CALCIUM", "PROT", "BILITOT", "ALKPHOS", "ALT", "AST", "GLUCOSE" in the last 168 hours.  Invalid input(s): "LABALBU"  Discharge Medications:  Allergies as of 12/13/2023       Reactions   Xarelto [rivaroxaban] Other (See Comments)   Skin  Blisters         Medication List     TAKE these medications    amLODipine 5 MG tablet Commonly known as: NORVASC TAKE 1 TABLET (5 MG TOTAL) BY MOUTH DAILY   aspirin EC 81 MG tablet Take 1 tablet (81 mg total) by mouth daily. Swallow whole. Notes to patient: Restart this evening, 12/13/23   cephALEXin 500 MG capsule Commonly known as: KEFLEX Take 1 capsule (500 mg total) by mouth 2 (two) times daily for 10 days.   clopidogrel 75 MG tablet Commonly known as: PLAVIX Take 1 tablet (75 mg total) by mouth daily. Notes to patient: Restart this evening, 12/13/23   furosemide 20 MG tablet Commonly known as: LASIX Take 1 tablet (20 mg total) by mouth daily.   hydrOXYzine 25 MG capsule Commonly known as: VISTARIL Take 1 capsule (25 mg total) by mouth every 8 (eight) hours as needed.   isosorbide mononitrate 30 MG 24 hr tablet Commonly known as: IMDUR TAKE ONE TABLET BY MOUTH DAILY   Januvia 25 MG tablet Generic drug: sitaGLIPtin TAKE ONE TABLET BY MOUTH DAILY   KEYTRUDA IV Inject into the vein.   metoprolol tartrate 25 MG tablet Commonly known as: LOPRESSOR TAKE ONE TABLET BY MOUTH TWICE A DAY   nitroGLYCERIN 0.4 MG SL tablet Commonly known as: NITROSTAT TAKE ONE TABLET UNDER THE TONGUE EVERY FIVE MINUTES FOR THREE DOSES AS NEEDED FOR CHEST PAIN, CALL 911 IF 2ND DOSE DOESN'T HELP   olmesartan 40 MG tablet Commonly known as: BENICAR Take 40 mg by mouth daily.   oxybutynin 5 MG 24 hr tablet Commonly known as: DITROPAN-XL TAKE 1 TABLET (5 MG TOTAL) BY MOUTH AT BEDTIME.   phenylephrine 1 % nasal spray Commonly known as: NEO-SYNEPHRINE Place 1 drop into both nostrils every 6 (six) hours as needed for congestion.   rosuvastatin 40 MG tablet Commonly known as: CRESTOR TAKE 1 TABLET (40 MG TOTAL) BY MOUTH EVERY MORNING. PLEASE KEEP SCHEDULED APPOINTMENT   sodium bicarbonate 650 MG tablet Take 650 mg by mouth 2 (two) times daily.   tamsulosin 0.4 MG Caps  capsule Commonly known as: FLOMAX Take 1 capsule (0.4 mg total) by mouth daily.   traZODone 50 MG tablet Commonly known as: DESYREL Take 0.5-1 tablets (25-50 mg total) by mouth at bedtime as needed for sleep.   Vitamin D3 1000 units Caps Take 1,000 Units by mouth daily.        Disposition:  Home  Discharge Instructions     Call MD for:  difficulty breathing, headache or visual disturbances   Complete by: As directed    Call MD for:  extreme fatigue   Complete by: As directed    Call MD for:  hives   Complete by: As directed    Call MD for:  persistant dizziness or light-headedness   Complete by: As directed    Call MD for:  persistant nausea and vomiting   Complete by: As directed  Call MD for:  redness, tenderness, or signs of infection (pain, swelling, redness, odor or green/yellow discharge around incision site)   Complete by: As directed    Call MD for:  severe uncontrolled pain   Complete by: As directed    Call MD for:  temperature >100.4   Complete by: As directed    Diet - low sodium heart healthy   Complete by: As directed    Discharge instructions   Complete by: As directed    [facility] Procedure, Care After  Procedure MD: Dr. Alene Mires Clinical Coordinator: Karsten Fells, RN  This sheet gives you information about how to care for yourself after your procedure. Your health care provider may also give you more specific instructions. If you have problems or questions, contact your health care provider.  What can I expect after the procedure? After the procedure, it is common to have: Bruising around your puncture site. Tenderness around your puncture site. Tiredness (fatigue).  Medication instructions It is very important to continue to take your blood thinner as directed by your doctor after the Watchman procedure. Call your procedure doctor's office with question or concerns. If you are on Coumadin (warfarin), you will have your INR checked the week  after your procedure, with a goal INR of 2.0 - 3.0. Please follow your medication instructions on your discharge summary. Only take the medications listed on your discharge paperwork.  Follow up You will be seen in 1 month after your procedure You will have a repeat CT scan approximately 8 weeks after your procedure mark to check your device You will follow up the MD/APP who performed your procedure 6 months after your procedure The Watchman Clinical Coordinator will check in with you from time to time, including 1 and 2 years after your procedure.    Follow these instructions at home: Puncture site care  Follow instructions from your health care provider about how to take care of your puncture site. Make sure you: If present, leave stitches (sutures), skin glue, or adhesive strips in place.  If a large square bandage is present, this may be removed 24 hours after surgery.  Check your puncture site every day for signs of infection. Check for: Redness, swelling, or pain. Fluid or blood. If your puncture site starts to bleed, lie down on your back, apply firm pressure to the area, and contact your health care provider. Warmth. Pus or a bad smell. Driving Do not drive yourself home if you received sedation Do not drive for at least 4 days after your procedure or however long your health care provider recommends. (Do not resume driving if you have previously been instructed not to drive for other health reasons.) Do not spend greater than 1 hour at a time in a car for the first 3 days. Stop and take a break with a 5 minute walk at least every hour.  Do not drive or use heavy machinery while taking prescription pain medicine.  Activity Avoid activities that take a lot of effort, including exercise, for at least 7 days after your procedure. For the first 3 days, avoid sitting for longer than one hour at a time.  Avoid alcoholic beverages, signing paperwork, or participating in legal  proceedings for 24 hours after receiving sedation Do not lift anything that is heavier than 10 lb (4.5 kg) for one week.  No sexual activity for 1 week.  Return to your normal activities as told by your health care provider. Ask your health  care provider what activities are safe for you. General instructions Take over-the-counter and prescription medicines only as told by your health care provider. Do not use any products that contain nicotine or tobacco, such as cigarettes and e-cigarettes. If you need help quitting, ask your health care provider. You may shower after 24 hours, but Do not take baths, swim, or use a hot tub for 1 week.  Do not drink alcohol for 24 hours after your procedure. Keep all follow-up visits as told by your health care provider. This is important. Dental Work: You will require antibiotics prior to any dental work, including cleanings, for 6 months after your Watchman implantation to help protect you from infection. After 6 months, antibiotics are no longer required. Contact a health care provider if: You have redness, mild swelling, or pain around your puncture site. You have soreness in your throat or at your puncture site that does not improve after several days You have fluid or blood coming from your puncture site that stops after applying firm pressure to the area. Your puncture site feels warm to the touch. You have pus or a bad smell coming from your puncture site. You have a fever. You have chest pain or discomfort that spreads to your neck, jaw, or arm. You are sweating a lot. You feel nauseous. You have a fast or irregular heartbeat. You have shortness of breath. You are dizzy or light-headed and feel the need to lie down. You have pain or numbness in the arm or leg closest to your puncture site. Get help right away if: Your puncture site suddenly swells. Your puncture site is bleeding and the bleeding does not stop after applying firm pressure to the  area. These symptoms may represent a serious problem that is an emergency. Do not wait to see if the symptoms will go away. Get medical help right away. Call your local emergency services (911 in the [place] Do not drive yourself to the hospital. Summary After the procedure, it is normal to have bruising and tenderness at the puncture site in your groin, neck, or forearm. Check your puncture site every day for signs of infection. Get help right away if your puncture site is bleeding and the bleeding does not stop after applying firm pressure to the area. This is a medical emergency.  This information is not intended to replace advice given to you by your health care provider. Make sure you discuss any questions you have with your health care provider.   Increase activity slowly   Complete by: As directed        Follow-up Information     Janetta Hora, PA-C Follow up on 01/16/2024.   Specialties: Cardiology, Radiology Why: Your appointment is scheduled for 01/16/24 at 1:55PM. Please plan to arrive by 1:30PM Contact information: [address] [place] [address] [phone]                Duration of Discharge Encounter:  APP Time: 45 minutes   Signed, Georgie Chard, NP  12/13/2023 3:45 PM

## 2023-12-13 NOTE — Anesthesia Procedure Notes (Signed)
Procedure Name: Intubation Date/Time: 12/13/2023 7:52 AM  Performed by: Ayesha Rumpf, CRNAPre-anesthesia Checklist: Patient identified, Emergency Drugs available, Suction available and Patient being monitored Patient Re-evaluated:Patient Re-evaluated prior to induction Oxygen Delivery Method: Circle System Utilized Preoxygenation: Pre-oxygenation with 100% oxygen Induction Type: IV induction Ventilation: Mask ventilation without difficulty Laryngoscope Size: Mac and 4 Grade View: Grade I Tube type: Oral Tube size: 7.5 mm Number of attempts: 1 Airway Equipment and Method: Stylet and Oral airway Placement Confirmation: ETT inserted through vocal cords under direct vision, positive ETCO2 and breath sounds checked- equal and bilateral Secured at: 23 cm Tube secured with: Tape Dental Injury: Teeth and Oropharynx as per pre-operative assessment

## 2023-12-13 NOTE — H&P (Signed)
Electrophysiology Note:   Date:  12/13/2023  ID:  Brylan Dec, DOB 1946-05-05, MRN [id number]   Primary Cardiologist: Rollene Rotunda, MD Electrophysiologist: Nobie Putnam, MD       History of Present Illness:   Adam Harris is a 78 y.o. 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 Antoine Poche.   Patient has recurrent urethral carcinoma. Oral anticoagulation was stopped because of this. He remains off daily oral anti-coagulation. He takes it occasionally in efforts to feel protected. He states that with full dose anti-coagulation he would have intermittent gross hematuria. Otherwise no new or acute complaints.   Interval: Patient presents today for scheduled Watchman implant. He reports doing relatively well. No new or acute complaints. He has been tolerating DAPT for ~3 weeks with no bleeding issues.    Review of systems complete and found to be negative unless listed in HPI.    EP Information / Studies Reviewed:     EKG is ordered today. EKG reviewed which showed atrial fibrillation.        Echo 06/12/2022: Normal LV size and function.  Moderate basal septal left ventricular hypertrophy.  LVEF 60 to 65%. Normal RV size and function. Mildly dilated left atrium.  Right atrium is normal in size. No significant valvular disease.   Risk Assessment/Calculations:     CHA2DS2-VASc Score = 6   This indicates a 9.7% annual risk of stroke. The patient's score is based upon: CHF History: 1 HTN History: 1 Diabetes History: 1 Stroke History: 0 Vascular Disease History: 1 Age Score: 2 Gender Score: 0               Physical Exam:     Today's Vitals   12/13/23 0555 12/13/23 0626  BP: (!) 155/93   Pulse: 75   Resp: 17   Temp: 97.6 F (36.4 C)   TempSrc: Oral   SpO2: 94%   Weight: 101.6 kg   Height: 5\' 11"  (1.803 m)   PainSc:  0-No pain   Body mass index is 31.24 kg/m.    GEN: Well nourished, well developed in no acute distress NECK: No JVD. CARDIAC: Normal rate, irregular. RESPIRATORY:  Clear to auscultation without rales, wheezing or rhonchi  ABDOMEN: Soft, non-tender, non-distended EXTREMITIES:  1+ edema; No deformity    ASSESSMENT AND PLAN:   Adam Harris is a 78 y.o. 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 Antoine Poche.   I have seen Adam Harris in the office today who is being considered for a Watchman left atrial appendage closure device. I believe they will benefit from this procedure given their history of atrial fibrillation, CHA2DS2-VASc score of 6 and unadjusted ischemic stroke rate of 9.7% per year. Unfortunately, the patient is not felt to be a long term anticoagulation candidate secondary to hematuria in the setting of  urothelial cancer. The patient's chart has been reviewed and I feel that they would be a candidate for short term oral anticoagulation after Watchman implant.    It is my belief that after undergoing a LAA closure procedure, Adam Harris will not need long term anticoagulation which eliminates anticoagulation side effects and major bleeding risk.    Procedural risks for the Watchman implant have been reviewed with the patient including a 0.5% risk of stroke, <1% risk of perforation and <1% risk of device embolization. Other risks include bleeding, vascular damage,  tamponade, worsening renal function, and death. The patient understands these risk and wishes to proceed.       The published clinical data on the safety and effectiveness of WATCHMAN include but are not limited to the following: - Holmes DR, Everlene Farrier, Sick P et al. for the PROTECT AF Investigators. Percutaneous closure of the left atrial appendage versus warfarin therapy for prevention of stroke in patients with atrial fibrillation: a randomised non-inferiority trial. Lancet 2009; 374: 534-42. Everlene Farrier, Doshi SK, Isa Rankin D et al. on behalf of the PROTECT AF Investigators. Percutaneous Left Atrial Appendage Closure for Stroke Prophylaxis in Patients With Atrial Fibrillation 2.3-Year Follow-up of the PROTECT AF (Watchman Left Atrial Appendage System for Embolic Protection in Patients With Atrial Fibrillation) Trial. Circulation 2013; 127:720-729. - Alli O, Doshi S,  Kar S, Reddy VY, Sievert H et al. Quality of Life Assessment in the Randomized PROTECT AF (Percutaneous Closure of the Left Atrial Appendage Versus Warfarin Therapy for Prevention of Stroke in Patients With Atrial Fibrillation) Trial of Patients at Risk for Stroke With Nonvalvular Atrial Fibrillation. J Am Coll Cardiol 2013; 61:1790-8. Aline August DR, [place]ia [place], Price M, Whisenant B, Sievert H, Doshi S, Huber K, Reddy V. Prospective randomized evaluation of the Watchman left atrial appendage Device in patients with atrial fibrillation versus long-term warfarin therapy; the PREVAIL trial. Journal of the [employer] of Cardiology, Vol. 4, No. 1, 2014, 1-11. - Kar S, Doshi SK, Sadhu A, Horton R, Osorio J et al. Primary outcome evaluation of a next-generation left atrial appendage closure device: results from the PINNACLE FLX trial. Circulation 2021;143(18)1754-1762.      ASSESSMENT AND PLAN: Permanent Atrial Fibrillation (ICD10:  I48.11) The patient's CHA2DS2-VASc score is 6, indicating a 9.7% annual risk of stroke.     Secondary  Hypercoagulable State (ICD10:  D68.69) The patient is at significant risk for stroke/thromboembolism based upon his CHA2DS2-VASc Score of 6.    We will plan for DAPT for 6 months post implant.       Signed, Nobie Putnam, MD

## 2023-12-13 NOTE — Transfer of Care (Signed)
Immediate Anesthesia Transfer of Care Note  Patient: Adam Harris  Procedure(s) Performed: LEFT ATRIAL APPENDAGE OCCLUSION TRANSESOPHAGEAL ECHOCARDIOGRAM  Patient Location: PACU and Cath Lab  Anesthesia Type:General  Level of Consciousness: drowsy and responds to stimulation  Airway & Oxygen Therapy: Patient Spontanous Breathing and Patient connected to face mask oxygen  Post-op Assessment: Report given to RN and Post -op Vital signs reviewed and stable  Post vital signs: Reviewed and stable  Last Vitals:  Vitals Value Taken Time  BP 97/62 12/13/23 0933  Temp    Pulse 71 12/13/23 0935  Resp 23 12/13/23 0935  SpO2 93 % 12/13/23 0935  Vitals shown include unfiled device data.  Last Pain:  Vitals:   12/13/23 0626  TempSrc:   PainSc: 0-No pain         Complications: No notable events documented.

## 2023-12-13 NOTE — Telephone Encounter (Signed)
Attempted to contact patient after discharge earlier today.  Left message that he will be called tomorrow.

## 2023-12-13 NOTE — Progress Notes (Signed)
  [facility]    Patient doing well s/p LAAO. He is hemodynamically stable. Groin site remains stable. Plan early ambulation after bedrest complete with plan for discharge later today after the patient is seen by Dr. Jimmey Ralph. Post implant instructions reviewed with all questions answered.   Georgie Chard NP-C Phone: [phone]

## 2023-12-13 NOTE — Discharge Instructions (Signed)
WATCHMANT Procedure, Care After  Procedure MD: Dr. Alene Mires Clinical Coordinator: Karsten Fells, RN  This sheet gives you information about how to care for yourself after your procedure. Your health care provider may also give you more specific instructions. If you have problems or questions, contact your health care provider.  What can I expect after the procedure? After the procedure, it is common to have: Bruising around your puncture site. Tenderness around your puncture site. Tiredness (fatigue).  Medication instructions It is very important to continue to take your blood thinner as directed by your doctor after the Watchman procedure. Call your procedure doctor's office with question or concerns. If you are on Coumadin (warfarin), you will have your INR checked the week after your procedure, with a goal INR of 2.0 - 3.0. Please follow your medication instructions on your discharge summary. Only take the medications listed on your discharge paperwork.  Follow up You will be seen in 1 month after your procedure You will have a repeat CT scan approximately 8 weeks after your procedure mark to check your device You will follow up the MD/APP who performed your procedure 6 months after your procedure The Watchman Clinical Coordinator will check in with you from time to time, including 1 and 2 years after your procedure.    Follow these instructions at home: Puncture site care  Follow instructions from your health care provider about how to take care of your puncture site. Make sure you: If present, leave stitches (sutures), skin glue, or adhesive strips in place.  If a large square bandage is present, this may be removed 24 hours after surgery.  Check your puncture site every day for signs of infection. Check for: Redness, swelling, or pain. Fluid or blood. If your puncture site starts to bleed, lie down on your back, apply firm pressure to the area, and contact your health care  provider. Warmth. Pus or a bad smell. Driving Do not drive yourself home if you received sedation Do not drive for at least 4 days after your procedure or however long your health care provider recommends. (Do not resume driving if you have previously been instructed not to drive for other health reasons.) Do not spend greater than 1 hour at a time in a car for the first 3 days. Stop and take a break with a 5 minute walk at least every hour.  Do not drive or use heavy machinery while taking prescription pain medicine.  Activity Avoid activities that take a lot of effort, including exercise, for at least 7 days after your procedure. For the first 3 days, avoid sitting for longer than one hour at a time.  Avoid alcoholic beverages, signing paperwork, or participating in legal proceedings for 24 hours after receiving sedation Do not lift anything that is heavier than 10 lb (4.5 kg) for one week.  No sexual activity for 1 week.  Return to your normal activities as told by your health care provider. Ask your health care provider what activities are safe for you. General instructions Take over-the-counter and prescription medicines only as told by your health care provider. Do not use any products that contain nicotine or tobacco, such as cigarettes and e-cigarettes. If you need help quitting, ask your health care provider. You may shower after 24 hours, but Do not take baths, swim, or use a hot tub for 1 week.  Do not drink alcohol for 24 hours after your procedure. Keep all follow-up visits as told by your  health care provider. This is important. Dental Work: You will require antibiotics prior to any dental work, including cleanings, for 6 months after your Watchman implantation to help protect you from infection. After 6 months, antibiotics are no longer required. Contact a health care provider if: You have redness, mild swelling, or pain around your puncture site. You have soreness in your  throat or at your puncture site that does not improve after several days You have fluid or blood coming from your puncture site that stops after applying firm pressure to the area. Your puncture site feels warm to the touch. You have pus or a bad smell coming from your puncture site. You have a fever. You have chest pain or discomfort that spreads to your neck, jaw, or arm. You are sweating a lot. You feel nauseous. You have a fast or irregular heartbeat. You have shortness of breath. You are dizzy or light-headed and feel the need to lie down. You have pain or numbness in the arm or leg closest to your puncture site. Get help right away if: Your puncture site suddenly swells. Your puncture site is bleeding and the bleeding does not stop after applying firm pressure to the area. These symptoms may represent a serious problem that is an emergency. Do not wait to see if the symptoms will go away. Get medical help right away. Call your local emergency services (911 in the [place] Do not drive yourself to the hospital. Summary After the procedure, it is normal to have bruising and tenderness at the puncture site in your groin, neck, or forearm. Check your puncture site every day for signs of infection. Get help right away if your puncture site is bleeding and the bleeding does not stop after applying firm pressure to the area. This is a medical emergency.  This information is not intended to replace advice given to you by your health care provider. Make sure you discuss any questions you have with your health care provider.

## 2023-12-13 NOTE — Progress Notes (Signed)
Patient and wife given discharge instructions including med admin, site care and follow up appointments.  PIVs removed. Patient being assisted getting dressed and wife to get car.

## 2023-12-14 ENCOUNTER — Encounter (HOSPITAL_COMMUNITY): Payer: Self-pay | Admitting: Cardiology

## 2023-12-14 ENCOUNTER — Telehealth: Payer: Self-pay

## 2023-12-14 NOTE — Transitions of Care (Post Inpatient/ED Visit) (Signed)
   12/14/2023  Name: Adam Harris MRN: [id number] DOB: [birth year]  Today's TOC FU Call Status: Today's TOC FU Call Status:: Unsuccessful Call (1st Attempt) Unsuccessful Call (1st Attempt) Date: 12/14/23  Attempted to reach the patient regarding the most recent Inpatient/ED visit.  Follow Up Plan: Additional outreach attempts will be made to reach the patient to complete the Transitions of Care (Post Inpatient/ED visit) call.   Wyline Mood BSN, Programmer, systems   Transitions of Care  [place] / [facility], [facility] Direct Dial Number: [phone]  Fax: [phone]

## 2023-12-14 NOTE — Telephone Encounter (Signed)
  [facility]   Watchman Team  Contacted the patient regarding discharge from [facility] on 12/13/23 s/p LAAO with Watchman   The patient understands to follow up with Carlean Jews, PA on 01/16/24 at 1:55PM  The patient understands discharge instructions? Yes  The patient understands medications and regimen? Yes   The patient reports groin site looks looks stable with no bleeding.  The patient understands to call with any questions or concerns prior to scheduled visit.

## 2023-12-14 NOTE — Anesthesia Postprocedure Evaluation (Signed)
Anesthesia Post Note  Patient: Adam Harris  Procedure(s) Performed: LEFT ATRIAL APPENDAGE OCCLUSION TRANSESOPHAGEAL ECHOCARDIOGRAM     Patient location during evaluation: Cath Lab Anesthesia Type: General Level of consciousness: awake and alert Pain management: pain level controlled Vital Signs Assessment: post-procedure vital signs reviewed and stable Respiratory status: spontaneous breathing, nonlabored ventilation and respiratory function stable Cardiovascular status: blood pressure returned to baseline and stable Postop Assessment: no apparent nausea or vomiting Anesthetic complications: no   No notable events documented.             Chey Cho

## 2023-12-14 NOTE — Telephone Encounter (Signed)
 Left message to call back

## 2023-12-17 ENCOUNTER — Telehealth: Payer: Self-pay

## 2023-12-17 NOTE — Transitions of Care (Post Inpatient/ED Visit) (Signed)
   12/17/2023  Name: Adam Harris MRN: [id number] DOB: 07-29-1946  Today's TOC FU Call Status: Today's TOC FU Call Status:: Unsuccessful Call (2nd Attempt) Unsuccessful Call (2nd Attempt) Date: 12/17/23  Attempted to reach the patient regarding the most recent Inpatient/ED visit.  Follow Up Plan: Additional outreach attempts will be made to reach the patient to complete the Transitions of Care (Post Inpatient/ED visit) call.   Wyline Mood BSN, Programmer, systems   Transitions of Care  [place] / [facility], [facility] Direct Dial Number: [phone]  Fax: [phone]

## 2023-12-18 ENCOUNTER — Telehealth: Payer: Self-pay | Admitting: *Deleted

## 2023-12-18 NOTE — Progress Notes (Signed)
 Patient Care Team: [name], MD as PCP - General (Family Medicine) [name], MD as PCP - Cardiology (Cardiology) [name], MD as PCP - Electrophysiology (Cardiology) [name], MD as Consulting Physician (Urology) [name], MD as Consulting Physician (Cardiology) [name], MD as Consulting Physician (Nephrology) [name], MD as Consulting Physician (Hematology and Oncology) [facility]  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  He has had multiple BCG maintenance therapy, is BCG refractory. -Given his BCG refractory disease, recurrence in bladder and the urethra, I recommend systemic therapy with Keytruda  every 3 weeks (or every 6 weeks if he tolerates well) for 2 years, per NCCN guideline.  He had no history of autoimmune disease, overall in good health, there is no contraindication for immunotherapy. --he started on 06/15/2023. C4 was postponed due to dyspnea and hypoxia on exertion, CT chest was negative for pneumonitis  -repeated cystoscopy and cytology was still positive in early Nov 2024. Decision made to continue Keytruda  for additional 3 months and re-evaluate.  Surgery is to be considered in cystoscopy still positive for cancer with next cystoscopy in February or March 2025. 12/19/2023 - patient tolerating Keytruda  well. Continue every 3 weeks.     Plan: Labs reviewed  -CBC showing WBC 9.6; Hgb 12.3; Hct 37.1; Plt 210; Anc 6.7 -CMP - K 4.2; glucose 165;  BUN 29; Creatinine 2.16; eGFR 31; Ca 8.8; LFTs normal.   Increased prescription of oxybutynin  XL to 15 mg with ability to take twice daily if needed for urinary frequency.  Recommended OTC Azo to help with dysuria.  He does follow with urology routinely.  Next cystoscopy due February or March 2025. Patient condition and labs are stable and satisfactory for treatment today.  Proceed with Keytruda . Labs, follow-up with Dr. Lanny, and treatment in 3 weeks as scheduled.  The patient understands the plans discussed today and is in agreement with them.  He knows to contact our office if he develops concerns prior to his next appointment.  I provided 25 minutes of face-to-face time during this encounter and > 50% was spent counseling as documented under my assessment and plan.    Adam FORBES Lessen, NP  [facility] - A DEPT OF [facility] [address] [place] [address] Dept: [phone] Dept Fax: [phone]   No orders of the defined types were placed in this encounter.     CHIEF COMPLAINT:  CC: Follow-up urothelial carcinoma in situ  Current Treatment: Keytruda  every 3 weeks  INTERVAL HISTORY:  Adam Harris is here today for repeat clinical assessment.  He was last seen by Dr. Lanny on 11/22/2023.  Restarted oxybutynin  at 20 mg daily to help with nocturia.  Should be scheduled for cystoscopy in February or March with surgery consideration if cancer is persistent.  Most recent cystoscopy was done November 2024 with cytology being still positive for cancer.  Has been tolerating Keytruda  well overall.  Continues to have urinary  frequency and burning with urination.  Was recommended he take oxybutynin  20 mg however, prescription was for 5 mg.  Was taking 4 pills at once and has run out. He denies chest pain, chest pressure, or shortness of breath.  He did just have a Watchman placed due to permanent A-fib.  Doing well since procedure.  He  denies headaches or visual disturbances. He denies abdominal pain, nausea, vomiting, or changes in bowel or bladder habits.    He denies fevers or chills. He denies pain. His appetite is good. His weight has increased 4 pounds over last 3 weeks .  I have reviewed the past medical history, past surgical history, social history and family history with the patient and they are unchanged from previous note.  ALLERGIES:  is allergic to xarelto  [rivaroxaban ].  MEDICATIONS:  Current Outpatient Medications  Medication Sig Dispense Refill   amLODipine  (NORVASC ) 5 MG tablet TAKE 1 TABLET (5 MG TOTAL) BY MOUTH DAILY 90 tablet 3   aspirin  EC 81 MG tablet Take 1 tablet (81 mg total) by mouth daily. Swallow whole.     Cholecalciferol  (VITAMIN D3) 1000 units CAPS Take 1,000 Units by mouth daily.     clopidogrel  (PLAVIX ) 75 MG tablet Take 1 tablet (75 mg total) by mouth daily. 90 tablet 3   furosemide  (LASIX ) 20 MG tablet Take 1 tablet (20 mg total) by mouth daily. 90 tablet 3   hydrOXYzine  (VISTARIL ) 25 MG capsule Take 1 capsule (25 mg total) by mouth every 8 (eight) hours as needed. 30 capsule 2   isosorbide  mononitrate (IMDUR ) 30 MG 24 hr tablet TAKE ONE TABLET BY MOUTH DAILY 90 tablet 3   metoprolol  tartrate (LOPRESSOR ) 25 MG tablet TAKE ONE TABLET BY MOUTH TWICE A DAY 180 tablet 2   nitroGLYCERIN  (NITROSTAT ) 0.4 MG SL tablet TAKE ONE TABLET UNDER THE TONGUE EVERY FIVE MINUTES FOR THREE DOSES AS NEEDED FOR CHEST PAIN, CALL 911 IF 2ND DOSE DOESN'T HELP 25 tablet 4   olmesartan  (BENICAR ) 40 MG tablet Take 40 mg by mouth daily.     Pembrolizumab  (KEYTRUDA  IV) Inject into the vein.     phenylephrine  (NEO-SYNEPHRINE) 1 % nasal spray Place 1 drop into both nostrils every 6 (six) hours as needed for congestion.     rosuvastatin  (CRESTOR ) 40 MG tablet TAKE 1 TABLET (40 MG TOTAL) BY MOUTH EVERY MORNING. PLEASE KEEP SCHEDULED APPOINTMENT 90 tablet 3   sitaGLIPtin  (JANUVIA ) 25 MG tablet TAKE ONE TABLET BY MOUTH DAILY  60 tablet 1   sodium bicarbonate  650 MG tablet Take 650 mg by mouth 2 (two) times daily.     tamsulosin  (FLOMAX ) 0.4 MG CAPS capsule Take 1 capsule (0.4 mg total) by mouth daily. 30 capsule 3   traZODone  (DESYREL ) 50 MG tablet Take 0.5-1 tablets (25-50 mg total) by mouth at bedtime as needed for sleep. 30 tablet 3   oxybutynin  (DITROPAN  XL) 15 MG 24 hr tablet Take 1 tablet (15 mg total) by mouth 2 (two) times daily as needed. 60 tablet 1   No current facility-administered medications for this visit.    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 Staging form: Urinary Bladder, AJCC 8th Edition - Clinical stage from 06/08/2023: Stage 0is (cTis, cN0, cM0) - Signed by [name], MD on 06/09/2023 WHO/ISUP grade (low/high): High Grade Histologic grading system: 2 grade system     Recurrent urothelial carcinoma in situ of  GU tract  03/22/2020 Initial Diagnosis   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 Mild degradation secondary to lack of IV contrast and motion. 2. Similar to decrease in mild right-sided bladder wall thickening, nonspecific in the setting of prior bladder cancer. Most likely treatment related. 3. Borderline left external iliac and right inguinal adenopathy, decreased. This could represent response to therapy of metastasis or be reactive. 4. No evidence of abdominal metastasis or recurrent disease, status post right nephrectomy.   03/22/2023 Imaging   MR abdomen without contrast  IMPRESSION: 1. Mild degradation secondary to lack of IV contrast and motion. 2. Similar to decrease in mild right-sided bladder wall thickening, nonspecific in the setting of prior bladder cancer. Most likely treatment related. 3. Borderline left external iliac and right inguinal adenopathy, decreased. This could represent response to  therapy of metastasis or be reactive. 4. No evidence of abdominal metastasis or recurrent disease, status post right nephrectomy.     06/08/2023 Cancer Staging   Staging form: Urinary Bladder, AJCC 8th Edition - Clinical stage from 06/08/2023: Stage 0is (cTis, cN0, cM0) - Signed by [name], MD on 06/09/2023 WHO/ISUP grade (low/high): High Grade Histologic grading system: 2 grade system   07/31/2023 Imaging   CT chest without contrast   IMPRESSION: No findings suspicious for pneumonitis.  No evidence of metastatic disease.  Aortic Atherosclerosis (ICD10-I70.0).   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)   06/15/2023 -  Chemotherapy   Patient is on Treatment Plan : BLADDER Pembrolizumab  (200) q21d         REVIEW OF SYSTEMS:   Constitutional: Denies fevers, chills or abnormal weight loss Eyes: Denies blurriness of vision Ears, nose, mouth, throat, and face: Denies mucositis or sore throat Respiratory: Denies cough, dyspnea or wheezes Cardiovascular: Denies palpitation, chest discomfort or lower extremity swelling Gastrointestinal:  Denies nausea, heartburn or change in bowel habits Skin: Denies abnormal skin rashes Lymphatics: Denies new lymphadenopathy or easy bruising Neurological:Denies numbness, tingling or new weaknesses Behavioral/Psych: Mood is stable, no new changes  GU: urinary frequency and burning with urination.  All other systems were reviewed with the patient and are negative.   VITALS:   Today's Vitals   12/19/23 0829 12/19/23 0835 12/19/23 0849 12/19/23 0851  BP: (!) 153/90 (!) 143/85  138/88  Pulse: 60     Resp: 20     Temp: 97.6 F (36.4 C)     TempSrc: Temporal     SpO2: 95%     Weight: 228 lb 9.6 oz (103.7 kg)     PainSc:   0-No pain    Body mass index is 31.88 kg/m.   Wt Readings from Last 3 Encounters:  12/19/23 228 lb 9.6 oz (103.7 kg)  12/13/23 224  lb (101.6 kg)  12/10/23 227 lb 3.2 oz (103.1 kg)    Body mass index is 31.88 kg/m.  Performance status (ECOG): 1 - Symptomatic but completely ambulatory  PHYSICAL EXAM:   GENERAL:alert, no distress and comfortable SKIN: skin color, texture, turgor are normal, no rashes or significant lesions EYES: normal, Conjunctiva are pink and non-injected, sclera clear OROPHARYNX:no exudate, no erythema and lips, buccal mucosa, and tongue normal  NECK: supple, thyroid  normal size, non-tender, without nodularity LYMPH:  no palpable lymphadenopathy in the cervical, axillary or inguinal LUNGS: clear to auscultation and percussion with normal breathing  effort HEART: regular rate & rhythm and no murmurs and no lower extremity edema ABDOMEN:abdomen soft, non-tender and normal bowel sounds Musculoskeletal:no cyanosis of digits and no clubbing  NEURO: alert & oriented x 3 with fluent speech, no focal motor/sensory deficits  LABORATORY DATA:  I have reviewed the data as listed    Component Value Date/Time   NA 138 12/19/2023 0812   NA 141 11/05/2023 1104   K 4.2 12/19/2023 0812   CL 108 12/19/2023 0812   CO2 24 12/19/2023 0812   GLUCOSE 165 (H) 12/19/2023 0812   BUN 29 (H) 12/19/2023 0812   BUN 24 11/05/2023 1104   CREATININE 2.16 (H) 12/19/2023 0812   CREATININE 1.77 (H) 01/01/2017 0810   CALCIUM  8.8 (L) 12/19/2023 0812   PROT 6.8 12/19/2023 0812   ALBUMIN  3.3 (L) 12/19/2023 0812   AST 18 12/19/2023 0812   ALT 12 12/19/2023 0812   ALKPHOS 115 12/19/2023 0812   BILITOT 1.8 (H) 12/19/2023 0812   GFRNONAA 31 (L) 12/19/2023 0812   GFRAA 32 (L) 08/05/2020 0826    Lab Results  Component Value Date   WBC 9.6 12/19/2023   NEUTROABS 6.7 12/19/2023   HGB 12.3 (L) 12/19/2023   HCT 37.1 (L) 12/19/2023   MCV 86.7 12/19/2023   PLT 210 12/19/2023     RADIOGRAPHIC STUDIES: ECHO TEE Result Date: 12/13/2023    TRANSESOPHOGEAL ECHO REPORT   Patient Name:   Adam Harris  Date of Exam: 12/13/2023  Medical Rec #:  [id number]  Height:       71.0 in Accession #:    [id number] Weight:       224.0 lb Date of Birth:  Apr 15, 1946   BSA:          2.213 m Patient Age:    78 years   BP:           159/101 mmHg Patient Gender: M          HR:           85 bpm. Exam Location:  Inpatient Procedure: Transesophageal Echo, 3D Echo, Color Doppler and Cardiac Doppler Indications:     I48.2 Chronic atrial fibrillation  History:         Patient has prior history of Echocardiogram examinations, most                  recent 05/25/2022. CHF, CAD, Arrythmias:Atrial Fibrillation;                  Risk Factors:Hypertension, Diabetes and Dyslipidemia.  Sonographer:     Damien Senior RDCS Referring Phys:  [id number] FONDA KITTY Diagnosing Phys: Darryle Decent MD  Sonographer Comments: 27mm Watchman FLX implanted PROCEDURE: After discussion of the risks and benefits of a TEE, an informed consent was obtained from the patient. The transesophogeal probe was passed without difficulty through the esophogus of the patient. Sedation performed by different physician. The patient was monitored while under deep sedation. Image quality was excellent. The patient developed no complications during the procedure.  IMPRESSIONS  1. TEE guided LAA closure. 27 mm Watchman FLX placed without device leak. 29% compression noted. 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 Trivial pericardial effusion before and after the case. No immediate complications. 3D echo was used to size the watcman device and confirm appropriate deployment.  2. Left ventricular ejection fraction, by estimation, is 60 to 65%. The left ventricle has normal function.  3. Right ventricular systolic function is  normal. The right ventricular size is mildly enlarged.  4. Left atrial size was mildly dilated. No left atrial/left atrial appendage thrombus was detected.  5. The mitral valve is normal in structure. Mild mitral valve regurgitation. No evidence of mitral stenosis.   6. The aortic valve is tricuspid. Aortic valve regurgitation is trivial. No aortic stenosis is present.  7. There is mild (Grade II) layered plaque involving the descending aorta. FINDINGS  Left Ventricle: Left ventricular ejection fraction, by estimation, is 60 to 65%. The left ventricle has normal function. The left ventricular internal cavity size was normal in size. Right Ventricle: The right ventricular size is mildly enlarged. No increase in right ventricular wall thickness. Right ventricular systolic function is normal. Left Atrium: Left atrial size was mildly dilated. No left atrial/left atrial appendage thrombus was detected. Right Atrium: Right atrial size was normal in size. Pericardium: Trivial pericardial effusion is present. Mitral Valve: The mitral valve is normal in structure. Mild mitral valve regurgitation. No evidence of mitral valve stenosis. Tricuspid Valve: The tricuspid valve is grossly normal. Tricuspid valve regurgitation is mild . No evidence of tricuspid stenosis. Aortic Valve: The aortic valve is tricuspid. Aortic valve regurgitation is trivial. No aortic stenosis is present. Pulmonic Valve: The pulmonic valve was normal in structure. Pulmonic valve regurgitation is not visualized. No evidence of pulmonic stenosis. Aorta: The aortic root and ascending aorta are structurally normal, with no evidence of dilitation. There is mild (Grade II) layered plaque involving the descending aorta. Venous: The right upper pulmonary vein, right lower pulmonary vein, left lower pulmonary vein and left upper pulmonary vein are normal. IAS/Shunts: The atrial septum is grossly normal. Additional Comments: Spectral Doppler performed.  AORTA Ao Root diam: 3.30 cm Ao Asc diam:  3.51 cm Darryle Decent MD Electronically signed by Darryle Decent MD Signature Date/Time: 12/13/2023/10:40:10 AM    Final (Updated)    EP STUDY Result Date: 12/13/2023 CONCLUSIONS: 1.Successful implantation of a 27mm WATCHMAN Flx Pro  left atrial appendage occlusive device   2. TEE demonstrating no LAA thrombus 3. No early apparent complications. Post Implant Anticoagulation Strategy: Continue dual anti-platelet therapy with aspirin  and clopidogrel  for 6 months. Fonda Kitty, MD Cardiac Electrophysiology   DG Chest 2 View Result Date: 12/04/2023 CLINICAL DATA:  Preop chest radiograph. No current chest complaints. EXAM: CHEST - 2 VIEW COMPARISON:  07/26/2023.  CT, 07/31/2023. FINDINGS: Cardiac silhouette is normal in size. No mediastinal or masses. No evidence of adenopathy. Mildly thickened interstitial markings, chronic and stable. No evidence of pneumonia or pulmonary edema. No pleural effusion or pneumothorax. Skeletal structures are intact. IMPRESSION: No active cardiopulmonary disease. Electronically Signed   By: Alm Parkins M.D.   On: 12/04/2023 15:13

## 2023-12-18 NOTE — Transitions of Care (Post Inpatient/ED Visit) (Signed)
 12/18/2023  Name: Adam Harris MRN: [id number] DOB: July 31, 1946  Today's TOC FU Call Status: Today's TOC FU Call Status:: Successful TOC FU Call Completed TOC FU Call Complete Date: 12/18/23 Patient's Name and Date of Birth confirmed.  Transition Care Management Follow-up Telephone Call Date of Discharge: 12/13/23 [facility]charge Facility: [facility]) Type of Discharge: Inpatient Admission Primary Inpatient Discharge Diagnosis:: Presence of Watchman left atrial appendage closure device How have you been since you were released from the hospital?: Better Any questions or concerns?: No  Items Reviewed: Did you receive and understand the discharge instructions provided?: Yes Medications obtained,verified, and reconciled?: Yes (Medications Reviewed) Any new allergies since your discharge?: No Dietary orders reviewed?: No Do you have support at home?: Yes People in Home: spouse Name of Support/Comfort Primary Source: [place]  Medications Reviewed Today: Medications Reviewed Today     Reviewed by Kennieth Cathlean DEL, RN (Case Manager) on 12/18/23 at 1017  Med List Status: <None>   Medication Order Taking? Sig Documenting Provider Last Dose Status Informant  amLODipine  (NORVASC ) 5 MG tablet [id number] Yes TAKE 1 TABLET (5 MG TOTAL) BY MOUTH DAILY [name], MD Taking Active   aspirin  EC 81 MG tablet [id number] Yes Take 1 tablet (81 mg total) by mouth daily. Swallow whole. Arvil Poe D, NP Taking Active Self  Cholecalciferol  (VITAMIN D3) 1000 units CAPS [id number] Yes Take 1,000 Units by mouth daily. [provider] Taking Active Self  clopidogrel  (PLAVIX ) 75 MG tablet [id number] Yes Take 1 tablet (75 mg total) by mouth daily. Arvil Poe D, NP Taking Active Self  furosemide  (LASIX ) 20 MG tablet [id number] Yes Take 1 tablet (20 mg total) by mouth daily. [name], MD Taking Active Self  hydrOXYzine  (VISTARIL ) 25 MG capsule [id number] Yes Take 1 capsule (25 mg total) by mouth  every 8 (eight) hours as needed. [name], MD Taking Active Self  isosorbide  mononitrate (IMDUR ) 30 MG 24 hr tablet [id number] Yes TAKE ONE TABLET BY MOUTH DAILY [name], MD Taking Active Self  metoprolol  tartrate (LOPRESSOR ) 25 MG tablet [id number] Yes TAKE ONE TABLET BY MOUTH TWICE A DAY Hochrein, James, MD Taking Active Self  nitroGLYCERIN  (NITROSTAT ) 0.4 MG SL tablet [id number] Yes TAKE ONE TABLET UNDER THE TONGUE EVERY FIVE MINUTES FOR THREE DOSES AS NEEDED FOR CHEST PAIN, CALL 911 IF 2ND DOSE DOESN'T HELP [name], MD Taking Active Self           Med Note (RAKHIMOVA, RAUSHANIYA R   Thu Dec 13, 2023  6:17 AM) Last time- more than a year  olmesartan  (BENICAR ) 40 MG tablet [id number] Yes Take 40 mg by mouth daily. [provider] Taking Active Self  oxybutynin  (DITROPAN -XL) 5 MG 24 hr tablet [id number] Yes TAKE 1 TABLET (5 MG TOTAL) BY MOUTH AT BEDTIME. Boscia, Heather E, NP Taking Active Self  Pembrolizumab  (KEYTRUDA  IV) [id number] Yes Inject into the vein. [provider] Taking Active Self  phenylephrine  (NEO-SYNEPHRINE) 1 % nasal spray [id number] Yes Place 1 drop into both nostrils every 6 (six) hours as needed for congestion. [provider] Taking Active Self  rosuvastatin  (CRESTOR ) 40 MG tablet [id number] Yes TAKE 1 TABLET (40 MG TOTAL) BY MOUTH EVERY MORNING. PLEASE KEEP SCHEDULED APPOINTMENT [name], MD Taking Active Self  sitaGLIPtin  (JANUVIA ) 25 MG tablet [id number] Yes TAKE ONE TABLET BY MOUTH DAILY [name], MD Taking Active Self  sodium bicarbonate  650 MG tablet [id number] Yes Take 650 mg by mouth 2 (two) times daily. [provider] Taking Active Self  tamsulosin  (FLOMAX ) 0.4 MG CAPS capsule [id number] Yes Take 1 capsule (0.4 mg total) by mouth daily. [name], MD Taking Active Self  traZODone  (DESYREL ) 50 MG tablet [id number] Yes Take 0.5-1 tablets (25-50 mg total) by mouth at bedtime as needed for sleep. [name], MD Taking Active Self            Home Care and Equipment/Supplies: Were Home Health Services Ordered?: NA Any new equipment or medical supplies ordered?: NA  Functional Questionnaire: Do you need assistance with bathing/showering or dressing?: No Do you need assistance with meal preparation?: No Do you need assistance with eating?: No Do you have difficulty maintaining continence: No Do you need assistance with getting out of bed/getting out of a chair/moving?: No  Follow up appointments reviewed: PCP Follow-up appointment confirmed?: NA Specialist Hospital Follow-up appointment confirmed?: Yes Date of Specialist follow-up appointment?: 12/19/23 Follow-Up Specialty Provider:: [id number] Powell Lessen, [id number] Kathryn Thompson PA, [id number] Lynwood Schilling Do you need transportation to your follow-up appointment?: No Do you understand care options if your condition(s) worsen?: Yes-patient verbalized understanding  SDOH Interventions Today    Flowsheet Row Most Recent Value  SDOH Interventions   Food Insecurity Interventions Intervention Not Indicated  Housing Interventions Intervention Not Indicated  Transportation Interventions Intervention Not Indicated, Patient Resources (Friends/Family)  Utilities Interventions Intervention Not Indicated      Interventions Today    Flowsheet Row Most Recent Value  Chronic Disease   Chronic disease during today's visit Other  [Presence of Watchman left atrial appendage closure device]  General Interventions   General Interventions Discussed/Reviewed General Interventions Discussed, General Interventions Reviewed, Doctor Visits  Doctor Visits Discussed/Reviewed Doctor Visits Discussed, Specialist, Doctor Visits Reviewed  PCP/Specialist Visits Compliance with follow-up visit  Pharmacy Interventions   Pharmacy Dicussed/Reviewed Pharmacy Topics Discussed, Pharmacy Topics Reviewed       Cathlean Headland BSN RN [facility]  [facility] Care Management Coordinator [email or web address] Direct Dial: [phone]  Fax: [phone] Website: [email or web address]

## 2023-12-18 NOTE — Assessment & Plan Note (Addendum)
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  He has had multiple BCG maintenance therapy, is BCG refractory. -Given his BCG refractory disease, recurrence in bladder and the urethra, I recommend systemic therapy with Keytruda  every 3 weeks (or every 6 weeks if he tolerates well) for 2 years, per NCCN guideline.  He had no history of autoimmune disease, overall in good health, there is no contraindication for immunotherapy. --he started on 06/15/2023. C4 was postponed due to dyspnea and hypoxia on exertion, CT chest was negative for pneumonitis  -repeated cystoscopy and cytology was still positive in early Nov 2024. Decision made to continue Keytruda  for additional 3 months and re-evaluate.  Surgery is to be considered in cystoscopy still positive for cancer with next cystoscopy in February or March 2025. 12/19/2023 - patient tolerating Keytruda  well. Continue every 3 weeks.

## 2023-12-19 ENCOUNTER — Encounter: Payer: Self-pay | Admitting: Hematology

## 2023-12-19 ENCOUNTER — Encounter: Payer: Self-pay | Admitting: Nurse Practitioner

## 2023-12-19 ENCOUNTER — Inpatient Hospital Stay: Payer: Medicare PPO

## 2023-12-19 ENCOUNTER — Inpatient Hospital Stay: Payer: Medicare PPO | Attending: Hematology

## 2023-12-19 ENCOUNTER — Inpatient Hospital Stay (HOSPITAL_BASED_OUTPATIENT_CLINIC_OR_DEPARTMENT_OTHER): Payer: Medicare PPO | Admitting: Nurse Practitioner

## 2023-12-19 VITALS — BP 138/88 | HR 60 | Temp 97.6°F | Resp 20 | Wt 228.6 lb

## 2023-12-19 VITALS — BP 138/90 | HR 70 | Resp 22

## 2023-12-19 DIAGNOSIS — C679 Malignant neoplasm of bladder, unspecified: Secondary | ICD-10-CM | POA: Diagnosis not present

## 2023-12-19 DIAGNOSIS — Z5112 Encounter for antineoplastic immunotherapy: Secondary | ICD-10-CM | POA: Diagnosis not present

## 2023-12-19 DIAGNOSIS — Z79899 Other long term (current) drug therapy: Secondary | ICD-10-CM | POA: Insufficient documentation

## 2023-12-19 DIAGNOSIS — R350 Frequency of micturition: Secondary | ICD-10-CM | POA: Diagnosis not present

## 2023-12-19 DIAGNOSIS — C689 Malignant neoplasm of urinary organ, unspecified: Secondary | ICD-10-CM | POA: Diagnosis not present

## 2023-12-19 LAB — CMP (CANCER CENTER ONLY)
ALT: 12 U/L (ref 0–44)
AST: 18 U/L (ref 15–41)
Albumin: 3.3 g/dL — ABNORMAL LOW (ref 3.5–5.0)
Alkaline Phosphatase: 115 U/L (ref 38–126)
Anion gap: 6 (ref 5–15)
BUN: 29 mg/dL — ABNORMAL HIGH (ref 8–23)
CO2: 24 mmol/L (ref 22–32)
Calcium: 8.8 mg/dL — ABNORMAL LOW (ref 8.9–10.3)
Chloride: 108 mmol/L (ref 98–111)
Creatinine: 2.16 mg/dL — ABNORMAL HIGH (ref 0.61–1.24)
GFR, Estimated: 31 mL/min — ABNORMAL LOW (ref >60)
Glucose, Bld: 165 mg/dL — ABNORMAL HIGH (ref 70–99)
Potassium: 4.2 mmol/L (ref 3.5–5.1)
Sodium: 138 mmol/L (ref 135–145)
Total Bilirubin: 1.8 mg/dL — ABNORMAL HIGH (ref 0.0–1.2)
Total Protein: 6.8 g/dL (ref 6.5–8.1)

## 2023-12-19 LAB — CBC WITH DIFFERENTIAL (CANCER CENTER ONLY)
Abs Immature Granulocytes: 0.04 10*3/uL (ref 0.00–0.07)
Basophils Absolute: 0.1 10*3/uL (ref 0.0–0.1)
Basophils Relative: 1 %
Eosinophils Absolute: 0.4 10*3/uL (ref 0.0–0.5)
Eosinophils Relative: 4 %
HCT: 37.1 % — ABNORMAL LOW (ref 39.0–52.0)
Hemoglobin: 12.3 g/dL — ABNORMAL LOW (ref 13.0–17.0)
Immature Granulocytes: 0 %
Lymphocytes Relative: 12 %
Lymphs Abs: 1.1 10*3/uL (ref 0.7–4.0)
MCH: 28.7 pg (ref 26.0–34.0)
MCHC: 33.2 g/dL (ref 30.0–36.0)
MCV: 86.7 fL (ref 80.0–100.0)
Monocytes Absolute: 1.2 10*3/uL — ABNORMAL HIGH (ref 0.1–1.0)
Monocytes Relative: 12 %
Neutro Abs: 6.7 10*3/uL (ref 1.7–7.7)
Neutrophils Relative %: 71 %
Platelet Count: 210 10*3/uL (ref 150–400)
RBC: 4.28 MIL/uL (ref 4.22–5.81)
RDW: 15.9 % — ABNORMAL HIGH (ref 11.5–15.5)
WBC Count: 9.6 10*3/uL (ref 4.0–10.5)
nRBC: 0 % (ref 0.0–0.2)

## 2023-12-19 MED ORDER — SODIUM CHLORIDE 0.9 % IV SOLN
200.0000 mg | Freq: Once | INTRAVENOUS | Status: AC
  Administered 2023-12-19: 200 mg via INTRAVENOUS
  Filled 2023-12-19: qty 8

## 2023-12-19 MED ORDER — OXYBUTYNIN CHLORIDE ER 15 MG PO TB24: 15.0000 mg | ORAL_TABLET | Freq: Two times a day (BID) | ORAL | 1 refills | Status: DC | PRN

## 2023-12-19 MED ORDER — SODIUM CHLORIDE 0.9 % IV SOLN: Freq: Once | INTRAVENOUS | Status: AC

## 2023-12-19 NOTE — Patient Instructions (Signed)

## 2023-12-22 ENCOUNTER — Emergency Department (HOSPITAL_BASED_OUTPATIENT_CLINIC_OR_DEPARTMENT_OTHER)
Admission: EM | Admit: 2023-12-22 | Discharge: 2023-12-23 | Disposition: A | Discharge status: Home / Self Care | Payer: Medicare PPO | Attending: Emergency Medicine | Admitting: Emergency Medicine

## 2023-12-22 ENCOUNTER — Emergency Department (HOSPITAL_BASED_OUTPATIENT_CLINIC_OR_DEPARTMENT_OTHER): Payer: Medicare PPO

## 2023-12-22 ENCOUNTER — Encounter (HOSPITAL_BASED_OUTPATIENT_CLINIC_OR_DEPARTMENT_OTHER): Payer: Self-pay | Admitting: Emergency Medicine

## 2023-12-22 ENCOUNTER — Other Ambulatory Visit: Payer: Self-pay

## 2023-12-22 DIAGNOSIS — I2510 Atherosclerotic heart disease of native coronary artery without angina pectoris: Secondary | ICD-10-CM | POA: Insufficient documentation

## 2023-12-22 DIAGNOSIS — D649 Anemia, unspecified: Secondary | ICD-10-CM | POA: Diagnosis not present

## 2023-12-22 DIAGNOSIS — Z7982 Long term (current) use of aspirin: Secondary | ICD-10-CM | POA: Diagnosis not present

## 2023-12-22 DIAGNOSIS — I5030 Unspecified diastolic (congestive) heart failure: Secondary | ICD-10-CM | POA: Diagnosis not present

## 2023-12-22 DIAGNOSIS — R233 Spontaneous ecchymoses: Secondary | ICD-10-CM | POA: Diagnosis not present

## 2023-12-22 DIAGNOSIS — M79604 Pain in right leg: Secondary | ICD-10-CM | POA: Diagnosis not present

## 2023-12-22 DIAGNOSIS — D689 Coagulation defect, unspecified: Secondary | ICD-10-CM | POA: Diagnosis not present

## 2023-12-22 DIAGNOSIS — I130 Hypertensive heart and chronic kidney disease with heart failure and stage 1 through stage 4 chronic kidney disease, or unspecified chronic kidney disease: Secondary | ICD-10-CM | POA: Insufficient documentation

## 2023-12-22 DIAGNOSIS — M7989 Other specified soft tissue disorders: Secondary | ICD-10-CM | POA: Insufficient documentation

## 2023-12-22 DIAGNOSIS — E119 Type 2 diabetes mellitus without complications: Secondary | ICD-10-CM | POA: Diagnosis not present

## 2023-12-22 DIAGNOSIS — R600 Localized edema: Secondary | ICD-10-CM | POA: Diagnosis not present

## 2023-12-22 DIAGNOSIS — Z7984 Long term (current) use of oral hypoglycemic drugs: Secondary | ICD-10-CM | POA: Insufficient documentation

## 2023-12-22 DIAGNOSIS — Z79899 Other long term (current) drug therapy: Secondary | ICD-10-CM | POA: Diagnosis not present

## 2023-12-22 DIAGNOSIS — N289 Disorder of kidney and ureter, unspecified: Secondary | ICD-10-CM

## 2023-12-22 DIAGNOSIS — N189 Chronic kidney disease, unspecified: Secondary | ICD-10-CM | POA: Diagnosis not present

## 2023-12-22 DIAGNOSIS — R590 Localized enlarged lymph nodes: Secondary | ICD-10-CM | POA: Diagnosis not present

## 2023-12-22 LAB — CBC WITH DIFFERENTIAL/PLATELET
Abs Immature Granulocytes: 0.05 10*3/uL (ref 0.00–0.07)
Basophils Absolute: 0.2 10*3/uL — ABNORMAL HIGH (ref 0.0–0.1)
Basophils Relative: 1 %
Eosinophils Absolute: 0.5 10*3/uL (ref 0.0–0.5)
Eosinophils Relative: 5 %
HCT: 40 % (ref 39.0–52.0)
Hemoglobin: 12.9 g/dL — ABNORMAL LOW (ref 13.0–17.0)
Immature Granulocytes: 1 %
Lymphocytes Relative: 9 %
Lymphs Abs: 0.9 10*3/uL (ref 0.7–4.0)
MCH: 28 pg (ref 26.0–34.0)
MCHC: 32.3 g/dL (ref 30.0–36.0)
MCV: 87 fL (ref 80.0–100.0)
Monocytes Absolute: 1.4 10*3/uL — ABNORMAL HIGH (ref 0.1–1.0)
Monocytes Relative: 14 %
Neutro Abs: 7.4 10*3/uL (ref 1.7–7.7)
Neutrophils Relative %: 70 %
Platelets: 248 10*3/uL (ref 150–400)
RBC: 4.6 MIL/uL (ref 4.22–5.81)
RDW: 15.9 % — ABNORMAL HIGH (ref 11.5–15.5)
WBC: 10.5 10*3/uL (ref 4.0–10.5)
nRBC: 0 % (ref 0.0–0.2)

## 2023-12-22 LAB — COMPREHENSIVE METABOLIC PANEL
ALT: 14 U/L (ref 0–44)
AST: 21 U/L (ref 15–41)
Albumin: 3.2 g/dL — ABNORMAL LOW (ref 3.5–5.0)
Alkaline Phosphatase: 115 U/L (ref 38–126)
Anion gap: 7 (ref 5–15)
BUN: 24 mg/dL — ABNORMAL HIGH (ref 8–23)
CO2: 20 mmol/L — ABNORMAL LOW (ref 22–32)
Calcium: 8.8 mg/dL — ABNORMAL LOW (ref 8.9–10.3)
Chloride: 110 mmol/L (ref 98–111)
Creatinine, Ser: 1.9 mg/dL — ABNORMAL HIGH (ref 0.61–1.24)
GFR, Estimated: 36 mL/min — ABNORMAL LOW (ref >60)
Glucose, Bld: 129 mg/dL — ABNORMAL HIGH (ref 70–99)
Potassium: 3.9 mmol/L (ref 3.5–5.1)
Sodium: 137 mmol/L (ref 135–145)
Total Bilirubin: 2.4 mg/dL — ABNORMAL HIGH (ref 0.0–1.2)
Total Protein: 7.4 g/dL (ref 6.5–8.1)

## 2023-12-22 NOTE — ED Triage Notes (Signed)
 Pt c/o swelling and burning to RLE x a couple days; also had a Watchman placed on 1/30 and wants the incision site (RT groin) looked at

## 2023-12-23 LAB — APTT: aPTT: 60 s — ABNORMAL HIGH (ref 24–36)

## 2023-12-23 LAB — PROTIME-INR
INR: 1.4 — ABNORMAL HIGH (ref 0.8–1.2)
Prothrombin Time: 16.9 s — ABNORMAL HIGH (ref 11.4–15.2)

## 2023-12-23 NOTE — ED Provider Notes (Signed)
 [place] [facility] AT [facility] Provider Note   CSN: [id number] Arrival date & time: 12/22/23  1911     History  Chief Complaint  Patient presents with   Leg Swelling    Adam Harris is a 78 y.o. male.  The history is provided by the patient.  He has history of hypertension, diabetes, hyperlipidemia, coronary artery disease, diastolic heart failure, atrial fibrillation, chronic kidney disease and comes in because of leg swelling and red dots on his leg.  He had Watchman device inserted on 12/13/2023.  His wife is concerned that he may have sepsis, she thought that she saw some pus coming from the groin puncture.  He has had some bruising around the groin puncture.  He has not had fever, chills, sweats.  However, he has not been taking his furosemide  because it makes him urinate too much, and he has not been taking his metoprolol .  He states he has been taking all of his other medications including amlodipine  and olmesartan .   Home Medications Prior to Admission medications   Medication Sig Start Date End Date Taking? Authorizing Provider  amLODipine  (NORVASC ) 5 MG tablet TAKE 1 TABLET (5 MG TOTAL) BY MOUTH DAILY 12/05/23   [name], MD  aspirin  EC 81 MG tablet Take 1 tablet (81 mg total) by mouth daily. Swallow whole. 11/05/23   McDaniel, Jill D, NP  Cholecalciferol  (VITAMIN D3) 1000 units CAPS Take 1,000 Units by mouth daily.    [provider]  clopidogrel  (PLAVIX ) 75 MG tablet Take 1 tablet (75 mg total) by mouth daily. 11/05/23   McDaniel, Jill D, NP  furosemide  (LASIX ) 20 MG tablet Take 1 tablet (20 mg total) by mouth daily. 12/08/22   [name], MD  hydrOXYzine  (VISTARIL ) 25 MG capsule Take 1 capsule (25 mg total) by mouth every 8 (eight) hours as needed. 05/11/23   [name], MD  isosorbide  mononitrate (IMDUR ) 30 MG 24 hr tablet TAKE ONE TABLET BY MOUTH DAILY 08/24/23   [name], MD  metoprolol  tartrate (LOPRESSOR ) 25 MG tablet  TAKE ONE TABLET BY MOUTH TWICE A DAY 06/11/23   [name], MD  nitroGLYCERIN  (NITROSTAT ) 0.4 MG SL tablet TAKE ONE TABLET UNDER THE TONGUE EVERY FIVE MINUTES FOR THREE DOSES AS NEEDED FOR CHEST PAIN, CALL 911 IF 2ND DOSE DOESN'T HELP 11/10/22   [name], MD  olmesartan  (BENICAR ) 40 MG tablet Take 40 mg by mouth daily.    [provider]  oxybutynin  (DITROPAN  XL) 15 MG 24 hr tablet Take 1 tablet (15 mg total) by mouth 2 (two) times daily as needed. 12/19/23   Boscia, Heather E, NP  Pembrolizumab  (KEYTRUDA  IV) Inject into the vein.    [provider]  phenylephrine  (NEO-SYNEPHRINE) 1 % nasal spray Place 1 drop into both nostrils every 6 (six) hours as needed for congestion.    [provider]  rosuvastatin  (CRESTOR ) 40 MG tablet TAKE 1 TABLET (40 MG TOTAL) BY MOUTH EVERY MORNING. PLEASE KEEP SCHEDULED APPOINTMENT 08/24/23   [name], MD  sitaGLIPtin  (JANUVIA ) 25 MG tablet TAKE ONE TABLET BY MOUTH DAILY 09/12/23   [name], MD  sodium bicarbonate  650 MG tablet Take 650 mg by mouth 2 (two) times daily.    [provider]  tamsulosin  (FLOMAX ) 0.4 MG CAPS capsule Take 1 capsule (0.4 mg total) by mouth daily. 08/30/23   [name], MD  traZODone  (DESYREL ) 50 MG tablet Take 0.5-1 tablets (25-50 mg total) by  mouth at bedtime as needed for sleep. 10/16/23   [name], MD      Allergies    Xarelto  [rivaroxaban ]    Review of Systems   Review of Systems  All other systems reviewed and are negative.   Physical Exam Updated Vital Signs BP (!) 155/92 (BP Location: Right Arm)   Pulse 86   Temp (!) 97.5 F (36.4 C) (Oral)   Resp 18   Ht 5' 11 (1.803 m)   Wt 103.7 kg   SpO2 94%   BMI 31.89 kg/m  Physical Exam Vitals and nursing note reviewed.  78 year old male, resting comfortably and in no acute distress. Vital signs are significant for elevated blood pressure. Oxygen saturation is 94%, which is normal. Head is normocephalic and  atraumatic. PERRLA, EOMI. Oropharynx is clear. Neck is nontender and supple without adenopathy. Back is nontender and there is no CVA tenderness.  There is 2+ presacral edema. Lungs are clear without rales, wheezes, or rhonchi. Chest is nontender. Heart has regular rate and rhythm without murmur. Abdomen is soft, flat, nontender. Extremities have 3+ edema.  Right inguinal puncture site appears to be healing appropriately without erythema or swelling or purulent drainage.  There is some surrounding ecchymosis.  There are multiple petechiae on the calves-right worse than left.  Capillary refill is 2 seconds. Skin is warm and dry without rash. Neurologic: Mental status is normal, cranial nerves are intact, moves all extremities equally.      ED Results / Procedures / Treatments   Labs (all labs ordered are listed, but only abnormal results are displayed) Labs Reviewed  COMPREHENSIVE METABOLIC PANEL - Abnormal; Notable for the following components:      Result Value   CO2 20 (*)    Glucose, Bld 129 (*)    BUN 24 (*)    Creatinine, Ser 1.90 (*)    Calcium  8.8 (*)    Albumin  3.2 (*)    Total Bilirubin 2.4 (*)    GFR, Estimated 36 (*)    All other components within normal limits  CBC WITH DIFFERENTIAL/PLATELET - Abnormal; Notable for the following components:   Hemoglobin 12.9 (*)    RDW 15.9 (*)    Monocytes Absolute 1.4 (*)    Basophils Absolute 0.2 (*)    All other components within normal limits   Radiology US  Venous Img Lower Right (DVT Study) Result Date: 12/22/2023 CLINICAL DATA:  Right leg pain, recent procedure, swelling, bladder cancer EXAM: RIGHT LOWER EXTREMITY VENOUS DOPPLER ULTRASOUND TECHNIQUE: Gray-scale sonography with compression, as well as color and duplex ultrasound, were performed to evaluate the deep venous system(s) from the level of the common femoral vein through the popliteal and proximal calf veins. COMPARISON:  None Available. FINDINGS: VENOUS Normal  compressibility of the common femoral, superficial femoral, and popliteal veins, as well as the visualized calf veins. Visualized portions of profunda femoral vein and great saphenous vein unremarkable. No filling defects to suggest DVT on grayscale or color Doppler imaging. Doppler waveforms show normal direction of venous flow, normal respiratory plasticity and response to augmentation. Limited views of the contralateral common femoral vein are unremarkable. OTHER Right inguinal adenopathy, largest lymph node measuring 4.3 x 1.0 x 2.4 cm. Limitations: Limited visualization of the right calf veins due to diffuse subcutaneous edema. IMPRESSION: 1. No evidence of deep venous thrombosis within the right lower extremity. 2. Extensive subcutaneous edema throughout the right calf, limiting evaluation of the calf veins. 3. Right inguinal lymphadenopathy. Electronically  Signed   By: Ozell Daring M.D.   On: 12/22/2023 20:30    Procedures Procedures    Medications Ordered in ED Medications - No data to display  ED Course/ Medical Decision Making/ A&P                                 Medical Decision Making Amount and/or Complexity of Data Reviewed Labs: ordered.   Petechiae on the legs.  Consider thrombocytopenia, coagulopathy.  No signs of sepsis on exam.  Peripheral edema secondary to medication noncompliance with furosemide .  Elevated blood pressure also related to medication noncompliance with furosemide  and metoprolol .  I have reviewed his laboratory tests, and my interpretation is stable renal insufficiency, stable anemia, normal platelet count and normal WBC.  I have ordered additional laboratory tests of coagulation-prothrombin time and partial thromboplastin time.  I reviewed his past records and note hospital admission on 12/13/2023 for placement of Watchman device.  I have reviewed his coagulation studies and note minimally elevated INR which is probably not clinically significant,  significantly elevated PTT at a range it would be considered therapeutic for heparin .  Clinically, patient is not having any ongoing bleeding, but the coagulopathy may account for his petechiae.  I am referring him to hematology/oncology for outpatient evaluation.  I have encouraged him to take his furosemide  for his peripheral edema.  Final Clinical Impression(s) / ED Diagnoses Final diagnoses:  Coagulopathy (HCC)  Peripheral edema  Renal insufficiency  Normochromic normocytic anemia    Rx / DC Orders ED Discharge Orders          Ordered    Ambulatory referral to Hematology / Oncology       Comments: Your emergency department provider has referred you to see a hematology/oncology specialist. These are physicians who specialize in blood disorders and cancers, or findings concerning for cancer. You will receive a phone call from the [facility] Office to set up your appointment within 2 business days: [employer] operate Mon - Fri, 8:00 a.m. to 5:00 p.m.; closed for federally recognized holidays. Please be sure your phone is not set to block numbers during this time.   12/23/23 0123              Raford Lenis, MD 12/24/23 [phone]

## 2023-12-23 NOTE — Discharge Instructions (Addendum)
 Please make sure to take your furosemide  every day.  If you do not take it, your leg swelling will not go down.  Please take your metoprolol  as prescribed.  You will need to work with your cardiologist regarding the appropriate dose of furosemide  to get your leg swelling down without harming your kidneys.  You will need to follow-up with the hematology/oncology clinic regarding your abnormal blood clotting.  Return if you have any problems regarding unusual bleeding.

## 2023-12-25 ENCOUNTER — Telehealth: Payer: Self-pay

## 2023-12-25 NOTE — Transitions of Care (Post Inpatient/ED Visit) (Unsigned)
   12/25/2023  Name: Adam Harris MRN: [id number] DOB: [birth year]  Today's TOC FU Call Status: Unsuccessful Call (1st Attempt) Date: 12/25/23  Attempted to reach the patient regarding the most recent Inpatient/ED visit.  Follow Up Plan: Additional outreach attempts will be made to reach the patient to complete the Transitions of Care (Post Inpatient/ED visit) call.   Signature Karena Addison, LPN [facility] Nurse Health Advisor Direct Dial [phone]

## 2023-12-26 NOTE — Transitions of Care (Post Inpatient/ED Visit) (Signed)
12/26/2023  Name: Adam Harris MRN: [id number] DOB: [birth year]  Today's TOC FU Call Status: Today's TOC FU Call Status:: Successful TOC FU Call Completed Unsuccessful Call (1st Attempt) Date: 12/25/23 [facility] FU Call Complete Date: 12/26/23 Patient's Name and Date of Birth confirmed.  Transition Care Management Follow-up Telephone Call Date of Discharge: 12/23/23 Discharge Facility: [facility] Type of Discharge: Emergency Department Primary Inpatient Discharge Diagnosis:: kidney disease How have you been since you were released from the hospital?: Same Any questions or concerns?: Yes Patient Questions/Concerns:: pain  Items Reviewed: Did you receive and understand the discharge instructions provided?: Yes Medications obtained,verified, and reconciled?: Yes (Medications Reviewed) Any new allergies since your discharge?: No Dietary orders reviewed?: Yes Do you have support at home?: Yes People in Home: spouse  Medications Reviewed Today: Medications Reviewed Today     Reviewed by Karena Addison, LPN (Licensed Practical Nurse) on 12/26/23 at 1002  Med List Status: <None>   Medication Order Taking? Sig Documenting Provider Last Dose Status Informant  amLODipine (NORVASC) 5 MG tablet [id number] No TAKE 1 TABLET (5 MG TOTAL) BY MOUTH DAILY Rollene Rotunda, MD Taking Active   aspirin EC 81 MG tablet [id number] No Take 1 tablet (81 mg total) by mouth daily. Swallow whole. Georgie Chard D, NP Taking Active Self  Cholecalciferol (VITAMIN D3) 1000 units CAPS [id number] No Take 1,000 Units by mouth daily. [provider] Taking Active Self  clopidogrel (PLAVIX) 75 MG tablet [id number] No Take 1 tablet (75 mg total) by mouth daily. Georgie Chard D, NP Taking Active Self  furosemide (LASIX) 20 MG tablet [id number] No Take 1 tablet (20 mg total) by mouth daily. Rollene Rotunda, MD Taking Active Self  hydrOXYzine (VISTARIL) 25 MG capsule [id number] No Take 1 capsule (25 mg total) by  mouth every 8 (eight) hours as needed. Loyola Mast, MD Taking Active Self  isosorbide mononitrate (IMDUR) 30 MG 24 hr tablet [id number] No TAKE ONE TABLET BY MOUTH DAILY Loyola Mast, MD Taking Active Self  metoprolol tartrate (LOPRESSOR) 25 MG tablet [id number] No TAKE ONE TABLET BY MOUTH TWICE A Kenn File, MD Taking Active Self  nitroGLYCERIN (NITROSTAT) 0.4 MG SL tablet [id number] No TAKE ONE TABLET UNDER THE TONGUE EVERY FIVE MINUTES FOR THREE DOSES AS NEEDED FOR CHEST PAIN, CALL 911 IF 2ND DOSE DOESN'T HELP Rollene Rotunda, MD Taking Active Self           Med Note (RAKHIMOVA, Celine Mans   Thu Dec 13, 2023  6:17 AM) Last time- more than a year  olmesartan (BENICAR) 40 MG tablet [id number] No Take 40 mg by mouth daily. [provider] Taking Active Self  oxybutynin (DITROPAN XL) 15 MG 24 hr tablet [id number]  Take 1 tablet (15 mg total) by mouth 2 (two) times daily as needed. Carlean Jews, NP  Active   Pembrolizumab [facility] IV) [id number] No Inject into the vein. [provider] Taking Active Self  phenylephrine (NEO-SYNEPHRINE) 1 % nasal spray [id number] No Place 1 drop into both nostrils every 6 (six) hours as needed for congestion. [provider] Taking Active Self  rosuvastatin (CRESTOR) 40 MG tablet [id number] No TAKE 1 TABLET (40 MG TOTAL) BY MOUTH EVERY MORNING. PLEASE KEEP SCHEDULED APPOINTMENT Rollene Rotunda, MD Taking Active Self  sitaGLIPtin (JANUVIA) 25 MG tablet [id number] No TAKE ONE TABLET BY MOUTH DAILY Loyola Mast, MD Taking Active Self  sodium bicarbonate 650 MG tablet [id number] No Take 650 mg by mouth 2 (two) times daily.  [provider] Taking Active Self  tamsulosin (FLOMAX) 0.4 MG CAPS capsule [id number] No Take 1 capsule (0.4 mg total) by mouth daily. Loyola Mast, MD Taking Active Self  traZODone (DESYREL) 50 MG tablet [id number] No Take 0.5-1 tablets (25-50 mg total) by mouth at bedtime as needed for sleep. Loyola Mast, MD Taking Active Self            Home Care and Equipment/Supplies: Were Home Health Services Ordered?: NA Any new equipment or medical supplies ordered?: NA  Functional Questionnaire: Do you need assistance with bathing/showering or dressing?: No Do you need assistance with meal preparation?: No Do you need assistance with eating?: No Do you have difficulty maintaining continence: No Do you need assistance with getting out of bed/getting out of a chair/moving?: No Do you have difficulty managing or taking your medications?: No  Follow up appointments reviewed: PCP Follow-up appointment confirmed?: Yes Date of PCP follow-up appointment?: 12/27/23 Follow-up Provider: [facility] Follow-up appointment confirmed?: Yes Date of Specialist follow-up appointment?: 01/10/24 Follow-Up Specialty Provider:: onco Do you need transportation to your follow-up appointment?: No Do you understand care options if your condition(s) worsen?: Yes-patient verbalized understanding    SIGNATURE Karena Addison, LPN [facility] Nurse Health Advisor Direct Dial [phone]

## 2023-12-27 ENCOUNTER — Ambulatory Visit (INDEPENDENT_AMBULATORY_CARE_PROVIDER_SITE_OTHER): Payer: Medicare PPO | Admitting: Family Medicine

## 2023-12-27 ENCOUNTER — Encounter: Payer: Self-pay | Admitting: Family Medicine

## 2023-12-27 VITALS — BP 136/82 | HR 83 | Temp 97.4°F | Ht 71.0 in | Wt 216.4 lb

## 2023-12-27 DIAGNOSIS — I5032 Chronic diastolic (congestive) heart failure: Secondary | ICD-10-CM

## 2023-12-27 DIAGNOSIS — I4891 Unspecified atrial fibrillation: Secondary | ICD-10-CM | POA: Diagnosis not present

## 2023-12-27 DIAGNOSIS — R600 Localized edema: Secondary | ICD-10-CM

## 2023-12-27 DIAGNOSIS — R300 Dysuria: Secondary | ICD-10-CM

## 2023-12-27 DIAGNOSIS — R791 Abnormal coagulation profile: Secondary | ICD-10-CM

## 2023-12-27 DIAGNOSIS — Z95818 Presence of other cardiac implants and grafts: Secondary | ICD-10-CM

## 2023-12-27 DIAGNOSIS — C679 Malignant neoplasm of bladder, unspecified: Secondary | ICD-10-CM

## 2023-12-27 LAB — URINALYSIS, ROUTINE W REFLEX MICROSCOPIC
Ketones, ur: NEGATIVE
Nitrite: POSITIVE — AB
Specific Gravity, Urine: 1.02 (ref 1.000–1.030)
Total Protein, Urine: >=300 — AB
Urine Glucose: NEGATIVE
Urobilinogen, UA: 4 — AB (ref 0.0–1.0)
pH: 7 (ref 5.0–8.0)

## 2023-12-27 MED ORDER — PHENAZOPYRIDINE HCL 95 MG PO TABS: 95.0000 mg | ORAL_TABLET | Freq: Three times a day (TID) | ORAL | 2 refills | Status: DC | PRN

## 2023-12-27 NOTE — Assessment & Plan Note (Signed)
Appears partially decompensated, in light of lower leg edema. Continue metoprolol tartrate 25 mg twice daily, olmesartan 40 mg daily, dapagliflozin 10 mg daily. I recommend he take his furosemide 20 mg two tablets daily.

## 2023-12-27 NOTE — Assessment & Plan Note (Signed)
Continue under care of urology.

## 2023-12-27 NOTE — Assessment & Plan Note (Signed)
Rate controlled currently. Continue metoprolol tartrate 25 mg bid. Recent Watchman procedure.

## 2023-12-27 NOTE — Progress Notes (Signed)
[facility] PRIMARY CARE [facility] PRIMARY Trecia Rogers [facility] [address] [place] [address] Dept: [phone] Dept Fax: [phone]  Hospital Follow-up Visit  Subjective:    Patient ID: Adam Harris, male    DOB: 14-Sep-1946, 78 y.o..   MRN: [id number]  Chief Complaint  Patient presents with   Hospitalization Follow-up    Hospital f/u from 12/22/23.  C/o having burning sensation with urination.    History of Present Illness:  Patient is in today for follow-up from a recent hospitalization. [name] was in the hospital on 12/13/2023 to have a left atrial appendage occlusion (Watchman's procedure). This was performed due to his chronic atrial fibrillation and bleeding issues he had from anticoagulation complicating his bladder cancer. M.r Adam Harris feels he is doing well since the procedure related to his a. fib and bleeding. He is on metoprolol tartrate 25 mg twice daily for rate control.   [name] has a history of chronic diastolic heart failure. He is managed on metoprolol tartrate 25 mg twice daily, olmesartan 40 mg daily, dapagliflozin 10 mg daily and PRN furosemide. Since his hospitalization, he has had an increase in lower leg edema. He denies any orthopnea. He notes that he has been taking his furosemide 20 mg daily and has seen his weight come down. However, he admits his nephrologist had told him he should take 40 mg a day. However, [name] gets significant dysuria with this.   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 He has had a right nephroureterectomy. He is managed Keytruda infusions every 2 weeks..   [name] was seen at [facility] on 12/22/2023. At the time, he had a petechial rash around his ankles. He was found to have abnormal clotting tests. He has been referred to hematology for an assessment. He notes the rash has now resolved.  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   Chronic diastolic CHF (congestive heart failure), NYHA class 2 (HCC) 04/02/2017   Atrial fibrillation with RVR (HCC) 11/20/2016   OA (osteoarthritis) of knee 05/31/2016   Low back pain 05/31/2016   Exertional angina (HCC) 10/29/2014   Coronary artery disease involving native coronary artery of native heart with angina pectoris (HCC) 03/09/2014   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/2014   Hyperlipidemia 02/05/2014   History of non-ST elevation myocardial infarction (NSTEMI) 02/03/2014   Essential hypertension 02/03/2014   Anxiety state 04/19/2013   History of prostate cancer 04/19/2013   Insomnia 04/19/2013   Stiffness of joint, lower leg 04/19/2013   Sciatica 04/19/2013   Past Surgical History:  Procedure Laterality Date   CARDIAC CATHETERIZATION N/A 11/21/2016   Procedure: Left Heart Cath and Coronary Angiography;  Surgeon: Lennette Bihari, MD;  Location: [facility] INVASIVE CV LAB;  Service: Cardiovascular;  Laterality: N/A;  pRCA 100% , ostial LAD  45%, OM3 80%, mCFX to dCFX 100% (AV groove), lateral OM3 50%   COLONOSCOPY     CYSTOSCOPY WITH BIOPSY N/A 08/12/2020   Procedure: CYSTOSCOPY WITH BLADDER BIOPSY AND TRANSURETHRAL RESECTION OF BLADDER TUMOR;  Surgeon: Heloise Purpura, MD;  Location: [facility];  Service: Urology;  Laterality: N/A;   CYSTOSCOPY  WITH BIOPSY N/A 11/10/2021   Procedure: CYSTOSCOPY WITH BLADDER BIOPSIES/ LEFT RETROGRADE;  Surgeon: Heloise Purpura, MD;  Location: [facility];  Service: Urology;  Laterality: N/A;   CYSTOSCOPY WITH BIOPSY Left 07/27/2022   Procedure: CYSTOSCOPY WITH  BLADDER BIOPSY, TRANSURETHRAL FULGERATION OF BLADDER, EXAM UNDER ANESTHESIA, LEFT RETROGRADE PYELOGRAM;  Surgeon: Heloise Purpura, MD;  Location: [facility];  Service: Urology;  Laterality: Left;   CYSTOSCOPY WITH BIOPSY N/A 04/23/2023   Procedure: CYSTOSCOPY WITH BLADDER AND PROSTATIC URETHRAL BIOPSIES;  Surgeon: Heloise Purpura, MD;  Location: [facility];  Service: Urology;  Laterality: N/A;  60 MINUTES NEEDED FOR CASE   CYSTOSCOPY WITH URETEROSCOPY AND STENT PLACEMENT Right 12/15/2019   Procedure: CYSTOSCOPY WITH RIGHT RETROGRADE/ RIGHT URETEROSCOPY/ BIOPSY;  Surgeon: Ihor Gully, MD;  Location: [facility];  Service: Urology;  Laterality: Right;   CYSTOSCOPY/RETROGRADE/URETEROSCOPY Bilateral 03/18/2018   Procedure: CYSTOSCOPY/RETROGRADE/URETEROSCOPY.;  Surgeon: Ihor Gully, MD;  Location: [facility];  Service: Urology;  Laterality: Bilateral;   EYE SURGERY Bilateral    cateract in January 2023   HYDROCELE EXCISION Left 07/27/2022   Procedure: HYDROCELE REPAIR;  Surgeon: Heloise Purpura, MD;  Location: [facility];  Service: Urology;  Laterality: Left;  GENERAL ANESTHESIA WITH PARALYSIS   KNEE ARTHROSCOPY Right    LEFT ATRIAL APPENDAGE OCCLUSION N/A 12/13/2023   Procedure: LEFT ATRIAL APPENDAGE OCCLUSION;  Surgeon: Nobie Putnam, MD;  Location[facility] INVASIVE CV LAB;  Service: Cardiovascular;  Laterality: N/A;   LEFT HEART CATHETERIZATION WITH CORONARY ANGIOGRAM N/A 02/04/2014   Procedure: LEFT HEART CATHETERIZATION WITH CORONARY ANGIOGRAM;  Surgeon: Iran Ouch, MD;  Location: [facility] CATH LAB;  Service: Cardiovascular;  Laterality: N/A;  severe one-vessel CAD, chronically occluded RCA with faint left-to-right collaterals;  aneurysmal  LCFx with sluggish coronary flow;  normal LVSF w/ moderately elevated LVEDP (ostialOM2 20%, pOM3 20%, pD1 20%, mCFX 50%, diffuse 20% pCFX)   PROSTATE BIOPSY N/A 08/12/2020   Procedure: BIOPSY TRANSRECTAL ULTRASONIC PROSTATE (TUBP);  Surgeon: Heloise Purpura, MD;  Location: [facility];  Service: Urology;  Laterality: N/A;   ROBOT ASSITED LAPAROSCOPIC NEPHROURETERECTOMY Right 03/22/2020   Procedure: XI ROBOT ASSITED LAPAROSCOPIC NEPHROURETERECTOMY;  Surgeon: Heloise Purpura, MD;  Location: [facility];  Service: Urology;  Laterality: Right;   TRANSESOPHAGEAL ECHOCARDIOGRAM (CATH LAB) N/A 12/13/2023   Procedure: TRANSESOPHAGEAL ECHOCARDIOGRAM;  Surgeon: Nobie Putnam, MD;  Location: [facility] INVASIVE CV LAB;  Service: Cardiovascular;  Laterality: N/A;   TRANSTHORACIC ECHOCARDIOGRAM  11-22-2016   dr hochrein   ef 55-60%/ mild MR and TR/ moderate LAE   TRANSURETHRAL RESECTION OF BLADDER TUMOR N/A 02/10/2021   Procedure: TRANSURETHRAL RESECTION OF BLADDER TUMOR (TURBT)/ CYSTOSCOPY/ LEFT RETROGRADE;  Surgeon: Heloise Purpura, MD;  Location: [facility];  Service: Urology;  Laterality: N/A;  GENERAL ANESTHESIA WITH PARALYSIS   Family History  Problem Relation Age of Onset   Hypertension Mother    Cancer Mother        anal cancer   Esophageal cancer Neg Hx    Pancreatic cancer Neg Hx    Stomach cancer Neg Hx    Liver disease Neg Hx    Outpatient Medications Prior to Visit  Medication Sig Dispense Refill  amLODipine (NORVASC) 5 MG tablet TAKE 1 TABLET (5 MG TOTAL) BY MOUTH DAILY 90 tablet 3   aspirin EC 81 MG tablet Take 1 tablet (81 mg total) by mouth daily. Swallow whole.     Cholecalciferol (VITAMIN D3) 1000 units CAPS Take 1,000 Units by mouth daily.     clopidogrel (PLAVIX) 75 MG tablet Take 1 tablet (75 mg total) by mouth daily. 90 tablet 3   furosemide (LASIX) 20 MG tablet Take 1 tablet (20 mg total) by mouth daily. 90 tablet 3   hydrOXYzine (VISTARIL) 25 MG capsule Take 1 capsule (25 mg total) by mouth every 8  (eight) hours as needed. 30 capsule 2   isosorbide mononitrate (IMDUR) 30 MG 24 hr tablet TAKE ONE TABLET BY MOUTH DAILY 90 tablet 3   metoprolol tartrate (LOPRESSOR) 25 MG tablet TAKE ONE TABLET BY MOUTH TWICE A DAY 180 tablet 2   nitroGLYCERIN (NITROSTAT) 0.4 MG SL tablet TAKE ONE TABLET UNDER THE TONGUE EVERY FIVE MINUTES FOR THREE DOSES AS NEEDED FOR CHEST PAIN, CALL 911 IF 2ND DOSE DOESN'T HELP 25 tablet 4   olmesartan (BENICAR) 40 MG tablet Take 40 mg by mouth daily.     oxybutynin (DITROPAN XL) 15 MG 24 hr tablet Take 1 tablet (15 mg total) by mouth 2 (two) times daily as needed. 60 tablet 1   Pembrolizumab (KEYTRUDA IV) Inject into the vein.     phenylephrine (NEO-SYNEPHRINE) 1 % nasal spray Place 1 drop into both nostrils every 6 (six) hours as needed for congestion.     rosuvastatin (CRESTOR) 40 MG tablet TAKE 1 TABLET (40 MG TOTAL) BY MOUTH EVERY MORNING. PLEASE KEEP SCHEDULED APPOINTMENT 90 tablet 3   sitaGLIPtin (JANUVIA) 25 MG tablet TAKE ONE TABLET BY MOUTH DAILY 60 tablet 1   sodium bicarbonate 650 MG tablet Take 650 mg by mouth 2 (two) times daily.     tamsulosin (FLOMAX) 0.4 MG CAPS capsule Take 1 capsule (0.4 mg total) by mouth daily. 30 capsule 3   traZODone (DESYREL) 50 MG tablet Take 0.5-1 tablets (25-50 mg total) by mouth at bedtime as needed for sleep. 30 tablet 3   No facility-administered medications prior to visit.   Allergies  Allergen Reactions   Xarelto [Rivaroxaban] Other (See Comments)    Skin Blisters      Objective:   Today's Vitals   12/27/23 0955  BP: 136/82  Pulse: 83  Temp: (!) 97.4 F (36.3 C)  TempSrc: Temporal  SpO2: 94%  Weight: 216 lb 6.4 oz (98.2 kg)  Height: 5\' 11"  (1.803 m)   Body mass index is 30.18 kg/m.   General: Well developed, well nourished. No acute distress. Extremities: 3-4+ edema of both lower legs. Skin: Warm and dry. No rashes. Psych: Alert and oriented. Normal mood and affect.  Health Maintenance Due  Topic Date  Due   Zoster Vaccines- Shingrix (1 of 2) Never done   OPHTHALMOLOGY EXAM  05/18/2022   Lab Results    Latest Ref Rng & Units 12/22/2023    7:24 PM 12/19/2023    8:12 AM 11/22/2023    1:46 PM  CBC  WBC 4.0 - 10.5 K/uL 10.5  9.6  8.5   Hemoglobin 13.0 - 17.0 g/dL 84.1  32.4  40.1   Hematocrit 39.0 - 52.0 % 40.0  37.1  44.4   Platelets 150 - 400 K/uL 248  210  171        Latest Ref Rng & Units 12/22/2023  7:24 PM 12/19/2023    8:12 AM 11/22/2023    1:46 PM  CMP  Glucose 70 - 99 mg/dL 540  981  191   BUN 8 - 23 mg/dL 24  29  30    Creatinine 0.61 - 1.24 mg/dL 4.78  2.95  6.21   Sodium 135 - 145 mmol/L 137  138  141   Potassium 3.5 - 5.1 mmol/L 3.9  4.2  4.2   Chloride 98 - 111 mmol/L 110  108  112   CO2 22 - 32 mmol/L 20  24  23    Calcium 8.9 - 10.3 mg/dL 8.8  8.8  9.6   Total Protein 6.5 - 8.1 g/dL 7.4  6.8  7.4   Total Bilirubin 0.0 - 1.2 mg/dL 2.4  1.8  3.1   Alkaline Phos 38 - 126 U/L 115  115  105   AST 15 - 41 U/L 21  18  14    ALT 0 - 44 U/L 14  12  10     Component Ref Range & Units (hover) 4 d ago 7 yr ago 9 yr ago  Prothrombin Time 16.9 High  15.6 High  12.9 R  INR 1.4 High  1.24 R 0.99 R     Component Ref Range & Units (hover) 4 d ago (12/23/23) 7 yr ago (11/22/16) 7 yr ago (11/21/16) 7 yr ago (11/20/16) 7 yr ago (11/20/16)  aPTT 60 High  69 High  CM 89 High  CM 74 High  CM 35   Assessment & Plan:   Problem List Items Addressed This Visit       Cardiovascular and Mediastinum   Atrial fibrillation with RVR (HCC)   Rate controlled currently. Continue metoprolol tartrate 25 mg bid. Recent Watchman procedure.      Chronic diastolic CHF (congestive heart failure), NYHA class 2 (HCC)   Appears partially decompensated, in light of lower leg edema. Continue metoprolol tartrate 25 mg twice daily, olmesartan 40 mg daily, dapagliflozin 10 mg daily. I recommend he take his furosemide 20 mg two tablets daily.        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        Other   Presence of Watchman left atrial appendage closure device - Primary   Other Visit Diagnoses       Dysuria       Not clear as to the etiology. May related to bladder cancer issues. I will check a UA and culture. I will prescribe Pyridium for symptom management.   Relevant Medications   phenazopyridine (PYRIDIUM) 95 MG tablet   Other Relevant Orders   Urine Culture   Urinalysis, Routine w reflex microscopic     Lower leg edema       Weight is down 12 lbs in the past 8 days. I do recommend he take furosemide 40 mg daily to try and further reduce swelling.     Prolonged PTT       Chronically prolonged PTT. Has been referred to hematology.       Return in about 4 weeks (around 01/24/2024) for Reassessment.   Loyola Mast, MD

## 2023-12-28 ENCOUNTER — Encounter: Payer: Self-pay | Admitting: Family Medicine

## 2023-12-28 ENCOUNTER — Telehealth: Payer: Self-pay

## 2023-12-28 DIAGNOSIS — R300 Dysuria: Secondary | ICD-10-CM

## 2023-12-28 MED ORDER — PHENAZOPYRIDINE HCL 200 MG PO TABS: 200.0000 mg | ORAL_TABLET | Freq: Three times a day (TID) | ORAL | 1 refills | Status: DC | PRN

## 2023-12-28 NOTE — Addendum Note (Signed)
Addended by: Loyola Mast on: 12/28/2023 06:06 PM   Modules accepted: Orders

## 2023-12-28 NOTE — Telephone Encounter (Signed)
Patient would like for the 200 mg of Pyridium sent to the pharmacy so the insurance will cover this.   Please review and advise.  Thanks. Dm/cm

## 2023-12-31 LAB — URINE CULTURE
MICRO NUMBER:: 16080909
SPECIMEN QUALITY:: ADEQUATE

## 2024-01-01 ENCOUNTER — Encounter: Payer: Self-pay | Admitting: Hematology

## 2024-01-01 ENCOUNTER — Other Ambulatory Visit (HOSPITAL_COMMUNITY): Payer: Self-pay

## 2024-01-03 ENCOUNTER — Other Ambulatory Visit: Payer: Medicare PPO

## 2024-01-03 ENCOUNTER — Ambulatory Visit: Payer: Medicare PPO

## 2024-01-03 ENCOUNTER — Ambulatory Visit: Payer: Medicare PPO | Admitting: Hematology

## 2024-01-04 ENCOUNTER — Ambulatory Visit: Payer: Medicare PPO

## 2024-01-10 ENCOUNTER — Inpatient Hospital Stay: Payer: Medicare PPO | Admitting: Hematology

## 2024-01-10 ENCOUNTER — Inpatient Hospital Stay: Payer: Medicare PPO

## 2024-01-10 VITALS — BP 145/75 | HR 70 | Temp 97.7°F | Resp 17 | Wt 217.5 lb

## 2024-01-10 DIAGNOSIS — C689 Malignant neoplasm of urinary organ, unspecified: Secondary | ICD-10-CM

## 2024-01-10 DIAGNOSIS — C679 Malignant neoplasm of bladder, unspecified: Secondary | ICD-10-CM

## 2024-01-10 DIAGNOSIS — Z79899 Other long term (current) drug therapy: Secondary | ICD-10-CM | POA: Diagnosis not present

## 2024-01-10 DIAGNOSIS — Z5112 Encounter for antineoplastic immunotherapy: Secondary | ICD-10-CM | POA: Diagnosis not present

## 2024-01-10 LAB — CBC WITH DIFFERENTIAL (CANCER CENTER ONLY)
Abs Immature Granulocytes: 0.06 10*3/uL (ref 0.00–0.07)
Basophils Absolute: 0.1 10*3/uL (ref 0.0–0.1)
Basophils Relative: 1 %
Eosinophils Absolute: 0.3 10*3/uL (ref 0.0–0.5)
Eosinophils Relative: 2 %
HCT: 41.5 % (ref 39.0–52.0)
Hemoglobin: 13.4 g/dL (ref 13.0–17.0)
Immature Granulocytes: 1 %
Lymphocytes Relative: 11 %
Lymphs Abs: 1.3 10*3/uL (ref 0.7–4.0)
MCH: 27.9 pg (ref 26.0–34.0)
MCHC: 32.3 g/dL (ref 30.0–36.0)
MCV: 86.3 fL (ref 80.0–100.0)
Monocytes Absolute: 1.1 10*3/uL — ABNORMAL HIGH (ref 0.1–1.0)
Monocytes Relative: 10 %
Neutro Abs: 8.7 10*3/uL — ABNORMAL HIGH (ref 1.7–7.7)
Neutrophils Relative %: 75 %
Platelet Count: 190 10*3/uL (ref 150–400)
RBC: 4.81 MIL/uL (ref 4.22–5.81)
RDW: 17 % — ABNORMAL HIGH (ref 11.5–15.5)
WBC Count: 11.6 10*3/uL — ABNORMAL HIGH (ref 4.0–10.5)
nRBC: 0 % (ref 0.0–0.2)

## 2024-01-10 LAB — CMP (CANCER CENTER ONLY)
ALT: 11 U/L (ref 0–44)
AST: 17 U/L (ref 15–41)
Albumin: 3.7 g/dL (ref 3.5–5.0)
Alkaline Phosphatase: 112 U/L (ref 38–126)
Anion gap: 6 (ref 5–15)
BUN: 39 mg/dL — ABNORMAL HIGH (ref 8–23)
CO2: 23 mmol/L (ref 22–32)
Calcium: 9.4 mg/dL (ref 8.9–10.3)
Chloride: 110 mmol/L (ref 98–111)
Creatinine: 2.08 mg/dL — ABNORMAL HIGH (ref 0.61–1.24)
GFR, Estimated: 32 mL/min — ABNORMAL LOW (ref >60)
Glucose, Bld: 165 mg/dL — ABNORMAL HIGH (ref 70–99)
Potassium: 4.3 mmol/L (ref 3.5–5.1)
Sodium: 139 mmol/L (ref 135–145)
Total Bilirubin: 2.5 mg/dL — ABNORMAL HIGH (ref 0.0–1.2)
Total Protein: 7.6 g/dL (ref 6.5–8.1)

## 2024-01-10 LAB — TSH: TSH: 5.578 u[IU]/mL — ABNORMAL HIGH (ref 0.350–4.500)

## 2024-01-10 MED ORDER — SODIUM CHLORIDE 0.9 % IV SOLN
200.0000 mg | Freq: Once | INTRAVENOUS | Status: AC
  Administered 2024-01-10: 200 mg via INTRAVENOUS
  Filled 2024-01-10: qty 8

## 2024-01-10 MED ORDER — SODIUM CHLORIDE 0.9 % IV SOLN: Freq: Once | INTRAVENOUS | Status: AC

## 2024-01-10 NOTE — Patient Instructions (Signed)

## 2024-01-10 NOTE — Progress Notes (Signed)
 [facility]   [phone] [phone]   Clinic Follow up Note   Patient Care Team: Loyola Mast, MD as PCP - General (Family Medicine) Rollene Rotunda, MD as PCP - Cardiology (Cardiology) Nobie Putnam, MD as PCP - Electrophysiology (Cardiology) Heloise Purpura, MD as Consulting Physician (Urology) Rollene Rotunda, MD as Consulting Physician (Cardiology) Estanislado Emms, MD as Consulting Physician (Nephrology) Malachy Mood, MD as Consulting Physician (Hematology and Oncology) [facility]  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  He has had multiple BCG maintenance therapy, is BCG refractory. -Given his BCG refractory disease, recurrence in bladder and the urethra, I recommend systemic therapy with Keytruda every 3 weeks (or every 6 weeks if he tolerates well) for 2 years, per NCCN guideline.  He had no history of autoimmune disease, overall in good health, there is no contraindication for immunotherapy. --he started on 06/15/2023. C4 was postponed due to dyspnea and hypoxia on exertion, CT chest was negative for pneumonitis  -repeated cystoscopy and cytology was still positive in early Nov 2024. I discussed with Dr. Laverle Patter and we decide continue Keytruda for additional 3 months and re-evaluate    Assessment and Plan    Bladder Cancer Follow-up for bladder cancer. Reports frequent urination with burning sensation, sometimes excruciating,  occurring every 15-20 minutes. No issues from the last infusion. Scheduled for cystoscopy in March to evaluate treatment effectiveness. Labs show normal blood counts; other labs pending but expected to be normal. Discussed discomfort associated with cystoscopy and preference for sedation due to pain. Explained necessity of cystoscopy to guide future treatment decisions. - Continue current treatment regimen - Schedule cystoscopy in March - Follow-up after cystoscopy to determine next steps  Frequent Urination Reports frequent urination with burning sensation. Currently on oxybutynin, which has not provided significant relief. Possible irritation from bladder cancer treatment. Discuss symptoms with urologist at next visit and consider alternative medications if oxybutynin is ineffective. - Discuss symptoms with urologist at next visit - Consider alternative medications if oxybutynin is ineffective  Pruritus Reports persistent itching, particularly on the legs, attributed to habitual scratching. No new lesions or bleeding spots noted. Itching is a chronic issue. - Continue using lotion for symptomatic relief  Post-Watchman Device Placement Had a Watchman device placed at the end of January and has been on Plavix and aspirin since. No new bleeding episodes reported, indicating possible adaptation to the medication. - Continue Plavix and aspirin as prescribed  General Health Maintenance Blood pressure medication recently refilled. No other immediate needs for refills except for Flomax, which he reports as ineffective. - Discontinue Flomax as it is not providing relief - Monitor blood pressure and refill medications as needed  Plan -Lab reviewed, adequate for treatment, will proceed with Rande Lawman today  -He is due for repeating cystoscopy in March, I encouraged him to contact Dr. Laverle Patter -Follow-up in 3 weeks for next treatment.     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 Mild degradation secondary to lack of IV contrast and motion. 2. Similar to decrease in mild right-sided bladder wall thickening, nonspecific in the setting of prior bladder cancer. Most likely treatment related. 3. Borderline left external iliac and right inguinal adenopathy, decreased. This could represent response to therapy of metastasis or be reactive. 4. No evidence of abdominal metastasis or recurrent disease, status post right nephrectomy.   03/22/2023 Imaging   MR abdomen without contrast  IMPRESSION: 1. Mild degradation secondary to lack of IV contrast and motion. 2. Similar to decrease in mild right-sided bladder wall thickening, nonspecific in the setting of prior bladder cancer. Most likely treatment related. 3. Borderline left external iliac and right inguinal adenopathy, decreased. This could represent response to therapy of metastasis or be reactive. 4. No evidence of abdominal metastasis or recurrent disease, status post right nephrectomy.     06/08/2023 Cancer Staging   Staging form: Urinary Bladder, AJCC 8th Edition - Clinical stage from 06/08/2023: Stage 0is (cTis, cN0, cM0) - Signed by Malachy Mood, MD on 06/09/2023 WHO/ISUP grade (low/high): High Grade Histologic grading system: 2 grade system   07/31/2023 Imaging   CT chest without contrast   IMPRESSION: No findings suspicious for pneumonitis.  No evidence of metastatic disease.  Aortic Atherosclerosis  (ICD10-I70.0).   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  History of Present Illness   The patient, with a history of bladder cancer, presents for a follow-up visit. He recently visited the emergency room due to small bleeding spots on his legs, which he attributes to excessive scratching. The spots have since resolved. He also reports a persistent issue with frequent and painful urination, which he describes as an "excruciating burn." Despite taking oxybutynin, the symptoms have not improved.  The patient also has a history of heart issues and recently had a watchman device implanted. He is currently on Plavix and aspirin for this condition. He reports no further bleeding since starting Plavix. He is scheduled to see a cardiologist in March.  The patient also mentions a persistent itchiness, which he believes is a habit. He was previously on Flomax for prostate issues, but he does not believe it was helpful and has stopped taking it.         All other systems were reviewed with the patient and are negative.  MEDICAL HISTORY:  Past Medical History:  Diagnosis Date   Abnormal radiologic findings on diagnostic imaging of renal pelvis, ureter, or bladder    bilateral ureter abnormalities   Anticoagulant long-term use    eliquis   Anxiety    Arthritis    CAD (coronary artery disease) cardiologist-- dr hochrein   NSTEMI  02-04-2014  per cardiac cath chronic occluded RCA w/ faint left-to-right collaterals and aneurysmal LCFx with sluggish coronary flow/   NSTEMI --11-21-2016 per cardiac cath occluded proximal RCA & mid to diastal CFX 100%, med rx. If that does not work, PTCA  or CABG   Cancer [facility])    bladder and kidney   CKD (chronic kidney disease), stage III (HCC)    patient unaware   DOE (dyspnea on exertion)    Fatty liver    pt denies   Hematuria 02/2019   History of COVID-19 10/2019   History of non-ST elevation myocardial infarction (NSTEMI)    02-04-2014  and 11-21-2016  cardiac cath done both times ,  medically management   History of shingles 12/2017   slight pain and numbness still noted in the area   Hyperlipidemia    Hypertension    Insomnia    Myocardial infarction [facility]) 2015   Persistent atrial fibrillation [facility])    cardiologsit-- dr hochrein   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 Jimmey Ralph   Thoracic aortic atherosclerosis (HCC)    Type 2 diabetes mellitus (HCC)    Urinary frequency     SURGICAL HISTORY: Past Surgical History:  Procedure Laterality Date   CARDIAC CATHETERIZATION N/A 11/21/2016   Procedure: Left Heart Cath and Coronary Angiography;  Surgeon: Lennette Bihari, MD;  Location: [facility] INVASIVE CV LAB;  Service: Cardiovascular;  Laterality: N/A;  pRCA 100% , ostial LAD 45%, OM3 80%, mCFX to dCFX 100% (AV groove), lateral OM3 50%   COLONOSCOPY     CYSTOSCOPY WITH BIOPSY N/A 08/12/2020   Procedure: CYSTOSCOPY WITH BLADDER BIOPSY AND TRANSURETHRAL RESECTION OF BLADDER TUMOR;  Surgeon: Heloise Purpura, MD;  Location: [facility] ORS;  Service: Urology;  Laterality: N/A;   CYSTOSCOPY WITH BIOPSY N/A 11/10/2021   Procedure: CYSTOSCOPY WITH BLADDER BIOPSIES/ LEFT RETROGRADE;  Surgeon: Heloise Purpura, MD;  Location: [facility] ORS;  Service: Urology;  Laterality: N/A;   CYSTOSCOPY WITH BIOPSY Left 07/27/2022   Procedure: CYSTOSCOPY WITH  BLADDER BIOPSY, TRANSURETHRAL FULGERATION OF BLADDER, EXAM UNDER ANESTHESIA, LEFT RETROGRADE PYELOGRAM;  Surgeon: Heloise Purpura, MD;  Location: [facility] ORS;  Service: Urology;  Laterality: Left;   CYSTOSCOPY WITH BIOPSY N/A 04/23/2023   Procedure: CYSTOSCOPY  WITH BLADDER AND PROSTATIC URETHRAL BIOPSIES;  Surgeon: Heloise Purpura, MD;  Location: [facility] ORS;  Service: Urology;  Laterality: N/A;  60 MINUTES NEEDED FOR CASE   CYSTOSCOPY WITH URETEROSCOPY AND STENT PLACEMENT Right 12/15/2019   Procedure: CYSTOSCOPY WITH RIGHT RETROGRADE/ RIGHT URETEROSCOPY/ BIOPSY;  Surgeon: Ihor Gully, MD;  Location: [facility] [place];  Service: Urology;  Laterality: Right;   CYSTOSCOPY/RETROGRADE/URETEROSCOPY Bilateral 03/18/2018   Procedure: CYSTOSCOPY/RETROGRADE/URETEROSCOPY.;  Surgeon: Ihor Gully, MD;  Location: [facility];  Service: Urology;  Laterality: Bilateral;   EYE SURGERY Bilateral    cateract in January 2023   HYDROCELE EXCISION Left 07/27/2022   Procedure: HYDROCELE REPAIR;  Surgeon: Heloise Purpura, MD;  Location: [facility];  Service: Urology;  Laterality: Left;  GENERAL ANESTHESIA WITH PARALYSIS   KNEE ARTHROSCOPY Right    LEFT ATRIAL APPENDAGE OCCLUSION N/A 12/13/2023   Procedure: LEFT ATRIAL APPENDAGE OCCLUSION;  Surgeon: Nobie Putnam, MD;  Location: [facility] INVASIVE CV LAB;  Service: Cardiovascular;  Laterality: N/A;   LEFT HEART CATHETERIZATION WITH CORONARY ANGIOGRAM N/A 02/04/2014   Procedure: LEFT HEART CATHETERIZATION WITH CORONARY ANGIOGRAM;  Surgeon: Iran Ouch, MD;  Location: [facility] CATH LAB;  Service: Cardiovascular;  Laterality: N/A;  severe one-vessel CAD, chronically occluded RCA with faint left-to-right collaterals;  aneurysmal LCFx with sluggish coronary flow;  normal LVSF w/ moderately elevated LVEDP (ostialOM2 20%, pOM3 20%, pD1 20%, mCFX 50%, diffuse 20% pCFX)   PROSTATE BIOPSY N/A 08/12/2020   Procedure: BIOPSY TRANSRECTAL ULTRASONIC PROSTATE (TUBP);  Surgeon: Heloise Purpura, MD;  Location: [facility];  Service: Urology;  Laterality: N/A;   ROBOT ASSITED LAPAROSCOPIC NEPHROURETERECTOMY Right 03/22/2020   Procedure: XI ROBOT ASSITED LAPAROSCOPIC NEPHROURETERECTOMY;  Surgeon: Heloise Purpura, MD;  Location: [facility];  Service:  Urology;  Laterality: Right;   TRANSESOPHAGEAL ECHOCARDIOGRAM (CATH LAB) N/A 12/13/2023   Procedure: TRANSESOPHAGEAL ECHOCARDIOGRAM;  Surgeon: Nobie Putnam, MD;  Location: [facility] INVASIVE CV LAB;  Service: Cardiovascular;  Laterality: N/A;   TRANSTHORACIC ECHOCARDIOGRAM  11-22-2016   dr hochrein   ef 55-60%/ mild MR and TR/ moderate LAE   TRANSURETHRAL RESECTION OF BLADDER TUMOR N/A 02/10/2021   Procedure: TRANSURETHRAL RESECTION OF BLADDER TUMOR (TURBT)/ CYSTOSCOPY/ LEFT RETROGRADE;  Surgeon: Heloise Purpura, MD;  Location: [facility];  Service: Urology;  Laterality: N/A;  GENERAL ANESTHESIA WITH PARALYSIS    I have reviewed the social history and family history with the patient and they are unchanged from previous note.  ALLERGIES:  is allergic to xarelto [rivaroxaban].  MEDICATIONS:  Current Outpatient Medications  Medication Sig Dispense Refill   amLODipine (NORVASC) 5 MG tablet TAKE 1 TABLET (5 MG TOTAL) BY MOUTH DAILY 90 tablet 3   aspirin EC 81 MG tablet Take 1 tablet (81 mg total) by mouth daily. Swallow whole.     Cholecalciferol (VITAMIN D3) 1000 units CAPS Take 1,000 Units by mouth daily.     clopidogrel (PLAVIX) 75 MG tablet Take 1 tablet (75 mg total) by mouth daily. 90 tablet 3   furosemide (LASIX) 20 MG tablet Take 1 tablet (20 mg total) by mouth daily. 90 tablet 3   hydrOXYzine (VISTARIL) 25 MG capsule Take 1 capsule (25 mg total) by mouth every 8 (eight) hours as needed. 30 capsule 2   isosorbide mononitrate (IMDUR) 30 MG 24 hr tablet TAKE ONE TABLET BY MOUTH DAILY 90 tablet 3   metoprolol tartrate (LOPRESSOR) 25 MG tablet TAKE ONE TABLET BY MOUTH TWICE A DAY 180 tablet 2   nitroGLYCERIN (NITROSTAT) 0.4 MG SL tablet TAKE ONE TABLET UNDER THE TONGUE EVERY FIVE MINUTES FOR THREE DOSES AS NEEDED FOR CHEST PAIN, CALL 911 IF 2ND DOSE DOESN'T HELP 25 tablet 4   olmesartan (BENICAR) 40 MG tablet Take 40 mg by mouth daily.     oxybutynin (DITROPAN XL) 15 MG 24 hr tablet Take 1 tablet (15  mg total) by mouth 2 (two) times daily as needed. 60 tablet 1   Pembrolizumab (KEYTRUDA IV) Inject into the vein.     phenazopyridine (PYRIDIUM) 200 MG tablet Take 1 tablet (200 mg total) by mouth 3 (three) times daily as needed for pain. 30 tablet 1   phenylephrine (NEO-SYNEPHRINE) 1 % nasal spray Place 1 drop into both nostrils every 6 (six) hours as needed for congestion.     rosuvastatin (CRESTOR) 40 MG tablet TAKE 1 TABLET (40 MG TOTAL) BY MOUTH EVERY MORNING. PLEASE KEEP SCHEDULED APPOINTMENT 90 tablet 3   sitaGLIPtin (JANUVIA) 25 MG tablet TAKE ONE TABLET BY MOUTH DAILY 60 tablet 1   sodium bicarbonate 650 MG tablet Take 650 mg by mouth 2 (two) times daily.     traZODone (DESYREL) 50 MG tablet Take 0.5-1 tablets (25-50 mg total) by mouth at bedtime  as needed for sleep. 30 tablet 3   No current facility-administered medications for this visit.    PHYSICAL EXAMINATION: ECOG PERFORMANCE STATUS: 1 - Symptomatic but completely ambulatory  Vitals:   01/10/24 1328  BP: (!) 145/75  Pulse: 70  Resp: 17  Temp: 97.7 F (36.5 C)  SpO2: 98%   Wt Readings from Last 3 Encounters:  01/10/24 217 lb 8 oz (98.7 kg)  12/27/23 216 lb 6.4 oz (98.2 kg)  12/22/23 228 lb 9.9 oz (103.7 kg)     GENERAL:alert, no distress and comfortable SKIN: skin color, texture, turgor are normal, no rashes or significant lesions EYES: normal, Conjunctiva are pink and non-injected, sclera clear NECK: supple, thyroid normal size, non-tender, without nodularity LYMPH:  no palpable lymphadenopathy in the cervical, axillary  LUNGS: clear to auscultation and percussion with normal breathing effort HEART: regular rate & rhythm and no murmurs and no lower extremity edema ABDOMEN:abdomen soft, non-tender and normal bowel sounds Musculoskeletal:no cyanosis of digits and no clubbing  NEURO: alert & oriented x 3 with fluent speech, no focal motor/sensory deficits       LABORATORY DATA:  I have reviewed the data as  listed    Latest Ref Rng & Units 01/10/2024    1:15 PM 12/22/2023    7:24 PM 12/19/2023    8:12 AM  CBC  WBC 4.0 - 10.5 K/uL 11.6  10.5  9.6   Hemoglobin 13.0 - 17.0 g/dL 46.9  62.9  52.8   Hematocrit 39.0 - 52.0 % 41.5  40.0  37.1   Platelets 150 - 400 K/uL 190  248  210         Latest Ref Rng & Units 01/10/2024    1:15 PM 12/22/2023    7:24 PM 12/19/2023    8:12 AM  CMP  Glucose 70 - 99 mg/dL 413  244  010   BUN 8 - 23 mg/dL 39  24  29   Creatinine 0.61 - 1.24 mg/dL 2.72  5.36  6.44   Sodium 135 - 145 mmol/L 139  137  138   Potassium 3.5 - 5.1 mmol/L 4.3  3.9  4.2   Chloride 98 - 111 mmol/L 110  110  108   CO2 22 - 32 mmol/L 23  20  24    Calcium 8.9 - 10.3 mg/dL 9.4  8.8  8.8   Total Protein 6.5 - 8.1 g/dL 7.6  7.4  6.8   Total Bilirubin 0.0 - 1.2 mg/dL 2.5  2.4  1.8   Alkaline Phos 38 - 126 U/L 112  115  115   AST 15 - 41 U/L 17  21  18    ALT 0 - 44 U/L 11  14  12        RADIOGRAPHIC STUDIES: I have personally reviewed the radiological images as listed and agreed with the findings in the report. No results found.    No orders of the defined types were placed in this encounter.  All questions were answered. The patient knows to call the clinic with any problems, questions or concerns. No barriers to learning was detected. The total time spent in the appointment was 25 minutes.     Malachy Mood, MD 01/10/2024

## 2024-01-10 NOTE — Assessment & Plan Note (Signed)
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  He has had multiple BCG maintenance therapy, is BCG refractory. -Given his BCG refractory disease, recurrence in bladder and the urethra, I recommend systemic therapy with Keytruda every 3 weeks (or every 6 weeks if he tolerates well) for 2 years, per NCCN guideline.  He had no history of autoimmune disease, overall in good health, there is no contraindication for immunotherapy. --he started on 06/15/2023. C4 was postponed due to dyspnea and hypoxia on exertion, CT chest was negative for pneumonitis  -repeated cystoscopy and cytology was still positive in early Nov 2024. I discussed with Dr. Laverle Patter and we decide continue Keytruda for additional 3 months and re-evaluate

## 2024-01-11 LAB — T4: T4, Total: 5.8 ug/dL (ref 4.5–12.0)

## 2024-01-15 NOTE — H&P (View-Only) (Signed)
 [facility]ART [facility] VASCULAR CENTER   MULTIDISCIPLINARY HEART VALVE CLINIC                                     Cardiology Office Note:    Date:  01/17/2024   ID:  Adam Harris, DOB 10-Dec-1945, MRN [id number]  PCP:  Adam Mast, MD  [facility] Cardiologist:  Adam Rotunda, MD  [facility] Electrophysiologist:  Adam Putnam, MD   Referring MD: Adam Mast, MD   1 month s/p LAAO with Watchman  History of Present Illness:    Adam Harris is a 78 y.o. 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 He had to discontinue his Eliquis due to profound hematuria and was referred to Dr. Jimmey Harris for consideration of LAAO with Watchman. This was scheduled and then cancelled due to marked hematuria on low dose Eliquis. He was switched to DAPT and ultimately underwent successful implantation of a 27 mm Watchman Flex Pro device on 12/13/23. He was discharged on a DAPT dual antiplatelet therapy with aspirin 81mg  daily and Plavix 75mg  daily for 6 months with plans for a follow up TEE given renal insufficiency.   Today the patient presents to clinic for follow up. Here with his wife. Has had hematuria x3-4 days but no clots. Just light pink blood. He does have some burning at night but it's been going on quite a while. Follows with Dr. Laverle Harris and will cal him to make a follow up appt. No CP or SOB. He has chronic mild LE edema that is stable but no orthopnea or PND. No dizziness or syncope. No blood in stool or urine. No palpitations.   Past Medical History:  Diagnosis Date   Abnormal radiologic findings on diagnostic imaging of renal pelvis, ureter, or bladder    bilateral  ureter abnormalities   Anticoagulant long-term use    eliquis   Anxiety    Arthritis    CAD (coronary artery disease) cardiologist-- dr hochrein   NSTEMI 02-04-2014  per cardiac cath chronic occluded RCA w/ faint left-to-right collaterals and aneurysmal LCFx with sluggish coronary flow/   NSTEMI --11-21-2016 per cardiac cath occluded proximal RCA & mid to diastal CFX 100%, med rx. 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   CKD (chronic kidney disease), stage III (HCC)    patient unaware   DOE (dyspnea on exertion)    Fatty liver    pt denies   Hematuria 02/2019   History of COVID-19 10/2019   History of non-ST elevation myocardial infarction (NSTEMI)    02-04-2014  and 11-21-2016  cardiac cath done both times ,  medically management   History of shingles 12/2017   slight pain and numbness still noted in the area   Hyperlipidemia    Hypertension    Insomnia    Myocardial infarction [facility]) 2015   Persistent atrial fibrillation [facility])    cardiologsit-- dr hochrein   Presence of Watchman left atrial appendage closure device 12/13/2023   27mm Watchman FLX Pro placed by  Dr. Jimmey Harris   Thoracic aortic atherosclerosis [facility])    Type 2 diabetes mellitus (HCC)    Urinary frequency      Current Medications: Current Meds  Medication Sig   amLODipine (NORVASC) 5 MG tablet TAKE 1 TABLET (5 MG TOTAL) BY MOUTH DAILY   Cholecalciferol (VITAMIN D3) 1000 units CAPS Take 1,000 Units by mouth daily.   clopidogrel (PLAVIX) 75 MG tablet Take 1 tablet (75 mg total) by mouth daily.   furosemide (LASIX) 20 MG tablet Take 1 tablet (20 mg total) by mouth daily.   hydrOXYzine (VISTARIL) 25 MG capsule Take 1 capsule (25 mg total) by mouth every 8 (eight) hours as needed.   isosorbide mononitrate (IMDUR) 30 MG 24 hr tablet TAKE ONE TABLET BY MOUTH DAILY   metoprolol tartrate (LOPRESSOR) 25 MG tablet TAKE ONE TABLET BY MOUTH TWICE A DAY   nitroGLYCERIN (NITROSTAT) 0.4 MG SL  tablet TAKE ONE TABLET UNDER THE TONGUE EVERY FIVE MINUTES FOR THREE DOSES AS NEEDED FOR CHEST PAIN, CALL 911 IF 2ND DOSE DOESN'T HELP   olmesartan (BENICAR) 40 MG tablet Take 40 mg by mouth daily.   oxybutynin (DITROPAN XL) 15 MG 24 hr tablet Take 1 tablet (15 mg total) by mouth 2 (two) times daily as needed.   phenazopyridine (PYRIDIUM) 200 MG tablet Take 1 tablet (200 mg total) by mouth 3 (three) times daily as needed for pain.   phenylephrine (NEO-SYNEPHRINE) 1 % nasal spray Place 1 drop into both nostrils every 6 (six) hours as needed for congestion.   rosuvastatin (CRESTOR) 40 MG tablet TAKE 1 TABLET (40 MG TOTAL) BY MOUTH EVERY MORNING. PLEASE KEEP SCHEDULED APPOINTMENT   sitaGLIPtin (JANUVIA) 25 MG tablet TAKE ONE TABLET BY MOUTH DAILY   sodium bicarbonate 650 MG tablet Take 650 mg by mouth 2 (two) times daily.   traZODone (DESYREL) 50 MG tablet Take 0.5-1 tablets (25-50 mg total) by mouth at bedtime as needed for sleep.   [DISCONTINUED] aspirin EC 81 MG tablet Take 1 tablet (81 mg total) by mouth daily. Swallow whole.      ROS:   Please see the history of present illness.    All other systems reviewed and are negative.  EKGs       Risk Assessment/Calculations:    CHA2DS2-VASc Score = 6   This indicates a 9.7% annual risk of stroke. The patient's score is based upon: CHF History: 1 HTN History: 1 Diabetes History: 1 Stroke History: 0 Vascular Disease History: 1 Age Score: 2 Gender Score: 0          Physical Exam:    VS:  BP 130/70   Pulse 76   Ht 5\' 11"  (1.803 m)   Wt 213 lb (96.6 kg)   BMI 29.71 kg/m     Wt Readings from Last 3 Encounters:  01/16/24 213 lb (96.6 kg)  01/10/24 217 lb 8 oz (98.7 kg)  12/27/23 216 lb 6.4 oz (98.2 kg)     GEN: Well nourished, well developed in no acute distress NECK: No JVD CARDIAC: irreg irreg, no murmurs, rubs, gallops RESPIRATORY:  Clear to auscultation without rales, wheezing or rhonchi  ABDOMEN: Soft, non-tender,  non-distended EXTREMITIES: Chronic mild LE edema. No deformity.   ASSESSMENT:    1. Presence of Watchman left atrial appendage closure device   2. Permanent atrial fibrillation (HCC)   3. Chronic diastolic HF (heart failure) (HCC)   4. Essential hypertension   5. Coronary artery disease involving native coronary artery of native  heart with angina pectoris (HCC)   6. CKD (chronic kidney disease), stage IV (HCC)   7. Pre-procedure lab exam     PLAN:    In order of problems listed above:  S/p Watchman: -- TEE 02/11/24 to ensure adequate seal of device.  -- CMP and CBC done on 2/27 at the cancer center (personally reviewed) and will be repeated on 01/31/24. -- Currently on DAPT with aspirin 81mg  daily and Plavix 75mg  daily and having recurrent hematuria. -- Plan to stop aspirin now and complete 6 months of Plavix 75mg  daily.  -- We will transition him from Plavix monotherapy to aspirin alone on 06/11/24 ( 6 months out from his procedure).   Permanent atrial fibrillation: -- Rate well controlled on Lopressor 25mg  BID. -- Cannot tolerate even low dose OAC with profound hematuria  -- Continue Plavix alone for now, as above.   Chronic diastolic CHF: -- Appears euvolemic aside from some mild LE edema that is chronic and stable.  -- Continue Lasix 20mg  daily.   HTN: -- BP well controlled.  -- Continue on Norvasc 5mg  daily, Imdur 30mg  daily, olmesartan 40mg  daily and Lasix 20mg  daily.   CAD: -- Continue Plavix for now and restart aspirin monotherapy 6 months out from [place]. -- Continue Crestor 40mg  daily.   CKD stage IV: -- 2.08 on recent labs (personally reviewed) which is within his previous baseline.  Informed Consent   Shared Decision Making/Informed Consent   The risks [esophageal damage, perforation (1:10,000 risk), bleeding, pharyngeal hematoma as well as other potential complications associated with conscious sedation including aspiration, arrhythmia, respiratory failure  and death], benefits (treatment guidance and diagnostic support) and alternatives of a transesophageal echocardiogram were discussed in detail with Mr. Wolf and he is willing to proceed.       Medication Adjustments/Labs and Tests Ordered: Current medicines are reviewed at length with the patient today.  Concerns regarding medicines are outlined above.  Orders Placed This Encounter  Procedures   Basic Metabolic Panel (BMET)   CBC   No orders of the defined types were placed in this encounter.   Patient Instructions  Medication Instructions:  No changes *If you need a refill on your cardiac medications before your next appointment, please call your pharmacy*   Lab Work: none   Testing/Procedures: Your physician has requested that you have a TEE. During a TEE, sound waves are used to create images of your heart. It provides your doctor with information about the size and shape of your heart and how well your heart's chambers and valves are working. In this test, a transducer is attached to the end of a flexible tube that's guided down your throat and into your esophagus (the tube leading from you mouth to your stomach) to get a more detailed image of your heart. You are not awake for the procedure. Please see the instruction sheet given to you today. For further information please visit [email or web address]   Follow-Up: July 2025 with Dr. Antoine Poche        Dear Lorn Junes  You are scheduled for a TEE (Transesophageal Echocardiogram) on Monday, March 31 with Dr. Flora Lipps..  Please arrive at the [facility] (Main Entrance A) at [facility]: [address] [place], [place] [address] at 9:00 am (This time is ONE hour(s) before your procedure to ensure your preparation).   Free valet parking service is available. You will check in at ADMITTING.   *Please Note: You will receive a call the day before  your procedure to confirm the appointment time. That time may have changed from the  original time based on the schedule for that day.*    DIET:  Nothing to eat or drink after midnight except a sip of water with medications (see medication instructions below)  MEDICATION INSTRUCTIONS: !!IF ANY NEW MEDICATIONS ARE STARTED AFTER TODAY, PLEASE NOTIFY YOUR PROVIDER AS SOON AS POSSIBLE!!  FYI: Medications such as Semaglutide (Ozempic, [place]), Tirzepatide (Mounjaro, Zepbound), Dulaglutide (Trulicity), etc ("GLP1 agonists") AND Canagliflozin (Invokana), Dapagliflozin (Farxiga), Empagliflozin (Jardiance), Ertugliflozin (Steglatro), Bexagliflozin [employer]) or any combination with one of these drugs such as Invokamet (Canagliflozin/Metformin), Synjardy (Empagliflozin/Metformin), etc ("SGLT2 inhibitors") must be held around the time of a procedure. This is not a comprehensive list of all of these drugs. Please review all of your medications and talk to your provider if you take any one of these. If you are not sure, ask your provider.   OKAY TO TAKE MORNING MEDICATIONS DAY OF PROCEDURE EXCEPT HOLD LASIX (FUROSEMIDE)  LABS:   WILL BE COLLECTED AT CANCER CENTER ON 01/31/24 (CBC AND BMET)  FYI:  Your support person will be asked to wait in the waiting room during your procedure.  It is OK to have someone drop you off and come back when you are ready to be discharged.  You cannot drive after the procedure and will need someone to drive you home.  Bring your insurance cards.  *Special Note: Every effort is made to have your procedure done on time. Occasionally there are emergencies that occur at the hospital that may cause delays. Please be patient if a delay does occur.

## 2024-01-15 NOTE — Progress Notes (Unsigned)
 HEART AND VASCULAR CENTER   MULTIDISCIPLINARY HEART VALVE CLINIC                                     Cardiology Office Note:    Date:  01/17/2024   ID:  Adam Harris, DOB 10-Dec-1945, MRN [id number]  PCP:  Loyola Mast, MD  [facility] Cardiologist:  Rollene Rotunda, MD  [facility] Electrophysiologist:  Nobie Putnam, MD   Referring MD: Loyola Mast, MD   1 month s/p LAAO with Watchman  History of Present Illness:    Adam Harris is a 78 y.o. 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 He had to discontinue his Eliquis due to profound hematuria and was referred to Dr. Jimmey Ralph for consideration of LAAO with Watchman. This was scheduled and then cancelled due to marked hematuria on low dose Eliquis. He was switched to DAPT and ultimately underwent successful implantation of a 27 mm Watchman Flex Pro device on 12/13/23. He was discharged on a DAPT dual antiplatelet therapy with aspirin 81mg  daily and Plavix 75mg  daily for 6 months with plans for a follow up TEE given renal insufficiency.   Today the patient presents to clinic for follow up. Here with his wife. Has had hematuria x3-4 days but no clots. Just light pink blood. He does have some burning at night but it's been going on quite a while. Follows with Dr. Laverle Patter and will cal him to make a follow up appt. No CP or SOB. He has chronic mild LE edema that is stable but no orthopnea or PND. No dizziness or syncope. No blood in stool or urine. No palpitations.   Past Medical History:  Diagnosis Date   Abnormal radiologic findings on diagnostic imaging of renal pelvis, ureter, or bladder    bilateral  ureter abnormalities   Anticoagulant long-term use    eliquis   Anxiety    Arthritis    CAD (coronary artery disease) cardiologist-- dr hochrein   NSTEMI 02-04-2014  per cardiac cath chronic occluded RCA w/ faint left-to-right collaterals and aneurysmal LCFx with sluggish coronary flow/   NSTEMI --11-21-2016 per cardiac cath occluded proximal RCA & mid to diastal CFX 100%, med rx. 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   CKD (chronic kidney disease), stage III (HCC)    patient unaware   DOE (dyspnea on exertion)    Fatty liver    pt denies   Hematuria 02/2019   History of COVID-19 10/2019   History of non-ST elevation myocardial infarction (NSTEMI)    02-04-2014  and 11-21-2016  cardiac cath done both times ,  medically management   History of shingles 12/2017   slight pain and numbness still noted in the area   Hyperlipidemia    Hypertension    Insomnia    Myocardial infarction [facility]) 2015   Persistent atrial fibrillation [facility])    cardiologsit-- dr hochrein   Presence of Watchman left atrial appendage closure device 12/13/2023   27mm Watchman FLX Pro placed by  Dr. Jimmey Ralph   Thoracic aortic atherosclerosis [facility])    Type 2 diabetes mellitus (HCC)    Urinary frequency      Current Medications: Current Meds  Medication Sig   amLODipine (NORVASC) 5 MG tablet TAKE 1 TABLET (5 MG TOTAL) BY MOUTH DAILY   Cholecalciferol (VITAMIN D3) 1000 units CAPS Take 1,000 Units by mouth daily.   clopidogrel (PLAVIX) 75 MG tablet Take 1 tablet (75 mg total) by mouth daily.   furosemide (LASIX) 20 MG tablet Take 1 tablet (20 mg total) by mouth daily.   hydrOXYzine (VISTARIL) 25 MG capsule Take 1 capsule (25 mg total) by mouth every 8 (eight) hours as needed.   isosorbide mononitrate (IMDUR) 30 MG 24 hr tablet TAKE ONE TABLET BY MOUTH DAILY   metoprolol tartrate (LOPRESSOR) 25 MG tablet TAKE ONE TABLET BY MOUTH TWICE A DAY   nitroGLYCERIN (NITROSTAT) 0.4 MG SL  tablet TAKE ONE TABLET UNDER THE TONGUE EVERY FIVE MINUTES FOR THREE DOSES AS NEEDED FOR CHEST PAIN, CALL 911 IF 2ND DOSE DOESN'T HELP   olmesartan (BENICAR) 40 MG tablet Take 40 mg by mouth daily.   oxybutynin (DITROPAN XL) 15 MG 24 hr tablet Take 1 tablet (15 mg total) by mouth 2 (two) times daily as needed.   phenazopyridine (PYRIDIUM) 200 MG tablet Take 1 tablet (200 mg total) by mouth 3 (three) times daily as needed for pain.   phenylephrine (NEO-SYNEPHRINE) 1 % nasal spray Place 1 drop into both nostrils every 6 (six) hours as needed for congestion.   rosuvastatin (CRESTOR) 40 MG tablet TAKE 1 TABLET (40 MG TOTAL) BY MOUTH EVERY MORNING. PLEASE KEEP SCHEDULED APPOINTMENT   sitaGLIPtin (JANUVIA) 25 MG tablet TAKE ONE TABLET BY MOUTH DAILY   sodium bicarbonate 650 MG tablet Take 650 mg by mouth 2 (two) times daily.   traZODone (DESYREL) 50 MG tablet Take 0.5-1 tablets (25-50 mg total) by mouth at bedtime as needed for sleep.   [DISCONTINUED] aspirin EC 81 MG tablet Take 1 tablet (81 mg total) by mouth daily. Swallow whole.      ROS:   Please see the history of present illness.    All other systems reviewed and are negative.  EKGs       Risk Assessment/Calculations:    CHA2DS2-VASc Score = 6   This indicates a 9.7% annual risk of stroke. The patient's score is based upon: CHF History: 1 HTN History: 1 Diabetes History: 1 Stroke History: 0 Vascular Disease History: 1 Age Score: 2 Gender Score: 0          Physical Exam:    VS:  BP 130/70   Pulse 76   Ht 5\' 11"  (1.803 m)   Wt 213 lb (96.6 kg)   BMI 29.71 kg/m     Wt Readings from Last 3 Encounters:  01/16/24 213 lb (96.6 kg)  01/10/24 217 lb 8 oz (98.7 kg)  12/27/23 216 lb 6.4 oz (98.2 kg)     GEN: Well nourished, well developed in no acute distress NECK: No JVD CARDIAC: irreg irreg, no murmurs, rubs, gallops RESPIRATORY:  Clear to auscultation without rales, wheezing or rhonchi  ABDOMEN: Soft, non-tender,  non-distended EXTREMITIES: Chronic mild LE edema. No deformity.   ASSESSMENT:    1. Presence of Watchman left atrial appendage closure device   2. Permanent atrial fibrillation (HCC)   3. Chronic diastolic HF (heart failure) (HCC)   4. Essential hypertension   5. Coronary artery disease involving native coronary artery of native  heart with angina pectoris (HCC)   6. CKD (chronic kidney disease), stage IV (HCC)   7. Pre-procedure lab exam     PLAN:    In order of problems listed above:  S/p Watchman: -- TEE 02/11/24 to ensure adequate seal of device.  -- CMP and CBC done on 2/27 at the cancer center (personally reviewed) and will be repeated on 01/31/24. -- Currently on DAPT with aspirin 81mg  daily and Plavix 75mg  daily and having recurrent hematuria. -- Plan to stop aspirin now and complete 6 months of Plavix 75mg  daily.  -- We will transition him from Plavix monotherapy to aspirin alone on 06/11/24 ( 6 months out from his procedure).   Permanent atrial fibrillation: -- Rate well controlled on Lopressor 25mg  BID. -- Cannot tolerate even low dose OAC with profound hematuria  -- Continue Plavix alone for now, as above.   Chronic diastolic CHF: -- Appears euvolemic aside from some mild LE edema that is chronic and stable.  -- Continue Lasix 20mg  daily.   HTN: -- BP well controlled.  -- Continue on Norvasc 5mg  daily, Imdur 30mg  daily, olmesartan 40mg  daily and Lasix 20mg  daily.   CAD: -- Continue Plavix for now and restart aspirin monotherapy 6 months out from [place]. -- Continue Crestor 40mg  daily.   CKD stage IV: -- 2.08 on recent labs (personally reviewed) which is within his previous baseline.  Informed Consent   Shared Decision Making/Informed Consent   The risks [esophageal damage, perforation (1:10,000 risk), bleeding, pharyngeal hematoma as well as other potential complications associated with conscious sedation including aspiration, arrhythmia, respiratory failure  and death], benefits (treatment guidance and diagnostic support) and alternatives of a transesophageal echocardiogram were discussed in detail with Adam Harris and he is willing to proceed.       Medication Adjustments/Labs and Tests Ordered: Current medicines are reviewed at length with the patient today.  Concerns regarding medicines are outlined above.  Orders Placed This Encounter  Procedures   Basic Metabolic Panel (BMET)   CBC   No orders of the defined types were placed in this encounter.   Patient Instructions  Medication Instructions:  No changes *If you need a refill on your cardiac medications before your next appointment, please call your pharmacy*   Lab Work: none   Testing/Procedures: Your physician has requested that you have a TEE. During a TEE, sound waves are used to create images of your heart. It provides your doctor with information about the size and shape of your heart and how well your heart's chambers and valves are working. In this test, a transducer is attached to the end of a flexible tube that's guided down your throat and into your esophagus (the tube leading from you mouth to your stomach) to get a more detailed image of your heart. You are not awake for the procedure. Please see the instruction sheet given to you today. For further information please visit [email or web address]   Follow-Up: July 2025 with Dr. Antoine Poche        Dear Adam Harris  You are scheduled for a TEE (Transesophageal Echocardiogram) on Monday, March 31 with Dr. Flora Lipps..  Please arrive at the [facility] (Main Entrance A) at [facility]: [address] [place], [place] [address] at 9:00 am (This time is ONE hour(s) before your procedure to ensure your preparation).   Free valet parking service is available. You will check in at ADMITTING.   *Please Note: You will receive a call the day before  your procedure to confirm the appointment time. That time may have changed from the  original time based on the schedule for that day.*    DIET:  Nothing to eat or drink after midnight except a sip of water with medications (see medication instructions below)  MEDICATION INSTRUCTIONS: !!IF ANY NEW MEDICATIONS ARE STARTED AFTER TODAY, PLEASE NOTIFY YOUR PROVIDER AS SOON AS POSSIBLE!!  FYI: Medications such as Semaglutide (Ozempic, [place]), Tirzepatide (Mounjaro, Zepbound), Dulaglutide (Trulicity), etc ("GLP1 agonists") AND Canagliflozin (Invokana), Dapagliflozin (Farxiga), Empagliflozin (Jardiance), Ertugliflozin (Steglatro), Bexagliflozin [employer]) or any combination with one of these drugs such as Invokamet (Canagliflozin/Metformin), Synjardy (Empagliflozin/Metformin), etc ("SGLT2 inhibitors") must be held around the time of a procedure. This is not a comprehensive list of all of these drugs. Please review all of your medications and talk to your provider if you take any one of these. If you are not sure, ask your provider.   OKAY TO TAKE MORNING MEDICATIONS DAY OF PROCEDURE EXCEPT HOLD LASIX (FUROSEMIDE)  LABS:   WILL BE COLLECTED AT CANCER CENTER ON 01/31/24 (CBC AND BMET)  FYI:  Your support person will be asked to wait in the waiting room during your procedure.  It is OK to have someone drop you off and come back when you are ready to be discharged.  You cannot drive after the procedure and will need someone to drive you home.  Bring your insurance cards.  *Special Note: Every effort is made to have your procedure done on time. Occasionally there are emergencies that occur at the hospital that may cause delays. Please be patient if a delay does occur.

## 2024-01-16 ENCOUNTER — Ambulatory Visit: Payer: Medicare PPO | Attending: Cardiology | Admitting: Physician Assistant

## 2024-01-16 VITALS — BP 130/70 | HR 76 | Ht 71.0 in | Wt 213.0 lb

## 2024-01-16 DIAGNOSIS — I5032 Chronic diastolic (congestive) heart failure: Secondary | ICD-10-CM | POA: Diagnosis not present

## 2024-01-16 DIAGNOSIS — Z01812 Encounter for preprocedural laboratory examination: Secondary | ICD-10-CM

## 2024-01-16 DIAGNOSIS — I10 Essential (primary) hypertension: Secondary | ICD-10-CM | POA: Diagnosis not present

## 2024-01-16 DIAGNOSIS — I4821 Permanent atrial fibrillation: Secondary | ICD-10-CM | POA: Diagnosis not present

## 2024-01-16 DIAGNOSIS — N184 Chronic kidney disease, stage 4 (severe): Secondary | ICD-10-CM | POA: Diagnosis not present

## 2024-01-16 DIAGNOSIS — Z95818 Presence of other cardiac implants and grafts: Secondary | ICD-10-CM

## 2024-01-16 DIAGNOSIS — I25119 Atherosclerotic heart disease of native coronary artery with unspecified angina pectoris: Secondary | ICD-10-CM

## 2024-01-16 NOTE — Patient Instructions (Signed)
 Medication Instructions:  No changes *If you need a refill on your cardiac medications before your next appointment, please call your pharmacy*   Lab Work: none   Testing/Procedures: Your physician has requested that you have a TEE. During a TEE, sound waves are used to create images of your heart. It provides your doctor with information about the size and shape of your heart and how well your heart's chambers and valves are working. In this test, a transducer is attached to the end of a flexible tube that's guided down your throat and into your esophagus (the tube leading from you mouth to your stomach) to get a more detailed image of your heart. You are not awake for the procedure. Please see the instruction sheet given to you today. For further information please visit [email or web address]   Follow-Up: July 2025 with Dr. Antoine Poche        Dear Adam Harris  You are scheduled for a TEE (Transesophageal Echocardiogram) on Monday, March 31 with Dr. Flora Lipps..  Please arrive at the [facility] (Main Entrance A) at [facility]: [address] [place], [place] [address] at 9:00 am (This time is ONE hour(s) before your procedure to ensure your preparation).   Free valet parking service is available. You will check in at ADMITTING.   *Please Note: You will receive a call the day before your procedure to confirm the appointment time. That time may have changed from the original time based on the schedule for that day.*    DIET:  Nothing to eat or drink after midnight except a sip of water with medications (see medication instructions below)  MEDICATION INSTRUCTIONS: !!IF ANY NEW MEDICATIONS ARE STARTED AFTER TODAY, PLEASE NOTIFY YOUR PROVIDER AS SOON AS POSSIBLE!!  FYI: Medications such as Semaglutide (Ozempic, [place]), Tirzepatide (Mounjaro, Zepbound), Dulaglutide (Trulicity), etc ("GLP1 agonists") AND Canagliflozin (Invokana), Dapagliflozin (Farxiga), Empagliflozin (Jardiance),  Ertugliflozin (Steglatro), Bexagliflozin [employer]) or any combination with one of these drugs such as Invokamet (Canagliflozin/Metformin), Synjardy (Empagliflozin/Metformin), etc ("SGLT2 inhibitors") must be held around the time of a procedure. This is not a comprehensive list of all of these drugs. Please review all of your medications and talk to your provider if you take any one of these. If you are not sure, ask your provider.   OKAY TO TAKE MORNING MEDICATIONS DAY OF PROCEDURE EXCEPT HOLD LASIX (FUROSEMIDE)  LABS:   WILL BE COLLECTED AT [facility] ON 01/31/24 (CBC AND BMET)  FYI:  Your support person will be asked to wait in the waiting room during your procedure.  It is OK to have someone drop you off and come back when you are ready to be discharged.  You cannot drive after the procedure and will need someone to drive you home.  Bring your insurance cards.  *Special Note: Every effort is made to have your procedure done on time. Occasionally there are emergencies that occur at the hospital that may cause delays. Please be patient if a delay does occur.

## 2024-01-17 ENCOUNTER — Ambulatory Visit: Payer: Medicare PPO

## 2024-01-17 ENCOUNTER — Ambulatory Visit: Payer: Medicare PPO | Admitting: Nurse Practitioner

## 2024-01-17 ENCOUNTER — Other Ambulatory Visit: Payer: Medicare PPO

## 2024-01-21 ENCOUNTER — Other Ambulatory Visit: Payer: Self-pay

## 2024-01-21 ENCOUNTER — Telehealth: Payer: Self-pay

## 2024-01-21 ENCOUNTER — Ambulatory Visit: Payer: Medicare PPO | Admitting: Cardiology

## 2024-01-21 NOTE — Telephone Encounter (Signed)
 Pt's wife called stated that the pt is having severe pain during urination which he's had going on for several months.  Pt's wife denied contacting the pt's urologist regarding pt's symptoms.  Asked if pt was taking Oxybutynin & Phenazopyridine as prescribed by Vincent Gros, NP and Dr. Herbie Drape.  Pt's wife stated the pt is taking the oybutynin but is not taking Phyenazopyridine.  Pt's wife would like to know if Dr. Mosetta Putt could prescribe something for pain.  Pt's wife stated the does not have blood in his urine but is having dysuria.  Stated this nurse will make Dr. Mosetta Putt and her Team aware of the pt's wife's call.  Pt's wife verbalized understanding and had no further questions or concerns.

## 2024-01-23 ENCOUNTER — Other Ambulatory Visit: Payer: Self-pay | Admitting: Family Medicine

## 2024-01-23 DIAGNOSIS — N401 Enlarged prostate with lower urinary tract symptoms: Secondary | ICD-10-CM | POA: Diagnosis not present

## 2024-01-23 DIAGNOSIS — R3912 Poor urinary stream: Secondary | ICD-10-CM | POA: Diagnosis not present

## 2024-01-23 DIAGNOSIS — Z8551 Personal history of malignant neoplasm of bladder: Secondary | ICD-10-CM | POA: Diagnosis not present

## 2024-01-23 DIAGNOSIS — C61 Malignant neoplasm of prostate: Secondary | ICD-10-CM | POA: Diagnosis not present

## 2024-01-23 DIAGNOSIS — C689 Malignant neoplasm of urinary organ, unspecified: Secondary | ICD-10-CM | POA: Diagnosis not present

## 2024-01-23 DIAGNOSIS — E1159 Type 2 diabetes mellitus with other circulatory complications: Secondary | ICD-10-CM

## 2024-01-24 ENCOUNTER — Other Ambulatory Visit: Payer: Medicare PPO

## 2024-01-24 ENCOUNTER — Ambulatory Visit: Payer: Medicare PPO | Admitting: Nurse Practitioner

## 2024-01-24 ENCOUNTER — Ambulatory Visit: Payer: Medicare PPO

## 2024-01-25 ENCOUNTER — Ambulatory Visit: Payer: Medicare PPO | Admitting: Family Medicine

## 2024-01-25 ENCOUNTER — Encounter: Payer: Self-pay | Admitting: Family Medicine

## 2024-01-25 VITALS — BP 136/82 | HR 98 | Temp 97.8°F | Ht 71.0 in | Wt 219.2 lb

## 2024-01-25 DIAGNOSIS — E1159 Type 2 diabetes mellitus with other circulatory complications: Secondary | ICD-10-CM | POA: Diagnosis not present

## 2024-01-25 DIAGNOSIS — R300 Dysuria: Secondary | ICD-10-CM | POA: Insufficient documentation

## 2024-01-25 DIAGNOSIS — I4891 Unspecified atrial fibrillation: Secondary | ICD-10-CM | POA: Diagnosis not present

## 2024-01-25 DIAGNOSIS — I5032 Chronic diastolic (congestive) heart failure: Secondary | ICD-10-CM

## 2024-01-25 DIAGNOSIS — C679 Malignant neoplasm of bladder, unspecified: Secondary | ICD-10-CM | POA: Diagnosis not present

## 2024-01-25 DIAGNOSIS — Z95818 Presence of other cardiac implants and grafts: Secondary | ICD-10-CM

## 2024-01-25 DIAGNOSIS — Z7984 Long term (current) use of oral hypoglycemic drugs: Secondary | ICD-10-CM

## 2024-01-25 NOTE — Progress Notes (Signed)
 [facility] [address] [place] [address] Dept: [phone] Dept Fax: [phone]  Chronic Care Office Visit  Subjective:    Patient ID: Adam Harris, male    DOB: Mar 03, 1946, 78 y.o..   MRN: [id number]  Chief Complaint  Patient presents with   Follow-up    4 week f/u.     History of Present Illness:  Patient is in today for reassessment of chronic medical issues.  [name] had a left atrial appendage occlusion device (Watchman's procedure) in late Jan. This was performed due to his chronic atrial fibrillation and bleeding issues he had from anticoagulation complicating his bladder cancer. He is on metoprolol tartrate 25 mg twice daily for rate control and remains on clopidogrel 75 mg daily for now. He has an upcoming TEE to assess the Watchman device. The cardiologist hopes to move him off of clopidogrel onto aspirin in June. [name] continues to have some degree of hematuria and passing clots.   [name] has a history of chronic diastolic heart failure. He is managed on metoprolol tartrate 25 mg twice daily, olmesartan 40 mg daily, dapagliflozin 10 mg daily and PRN furosemide. He has not been taking his furosemide consistently, complaining about how much this makes him urinate.   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 He has had a right nephroureterectomy. He also ahs a history of prior prostate cancer. He is managed on Keytruda infusions every 2 weeks. He notes frustration with her lack of improvement in his urinary symptoms. He has had some chronic dysuria. I had prescribed phenazopyridine for him. He feels it may have increased his bleeding.   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   Chronic diastolic CHF (congestive heart failure), NYHA class 2 (HCC) 04/02/2017   Atrial fibrillation with RVR (HCC) 11/20/2016   OA (osteoarthritis) of knee 05/31/2016   Low back pain 05/31/2016   Exertional angina (HCC) 10/29/2014   Coronary artery disease involving native coronary artery of native heart with angina pectoris (HCC) 03/09/2014   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/2014   Hyperlipidemia 02/05/2014   History of non-ST elevation myocardial infarction (NSTEMI) 02/03/2014   Essential hypertension 02/03/2014   Anxiety state 04/19/2013   History of prostate cancer 04/19/2013   Insomnia 04/19/2013   Stiffness of joint, lower leg 04/19/2013   Sciatica 04/19/2013   Past Surgical History:  Procedure Laterality Date   CARDIAC CATHETERIZATION N/A 11/21/2016   Procedure: Left Heart Cath and Coronary Angiography;  Surgeon: Lennette Bihari, MD;  Location: [facility] INVASIVE CV LAB;  Service: Cardiovascular;  Laterality: N/A;  pRCA 100% , ostial LAD 45%, OM3 80%, mCFX to dCFX 100% (AV groove), lateral OM3 50%   COLONOSCOPY     CYSTOSCOPY WITH BIOPSY  N/A 08/12/2020   Procedure: CYSTOSCOPY WITH BLADDER BIOPSY AND TRANSURETHRAL RESECTION OF BLADDER TUMOR;  Surgeon: Heloise Purpura, MD;  Location: [facility];  Service: Urology;  Laterality: N/A;   CYSTOSCOPY WITH BIOPSY N/A 11/10/2021   Procedure: CYSTOSCOPY WITH BLADDER BIOPSIES/ LEFT RETROGRADE;  Surgeon: Heloise Purpura, MD;   Location: [facility];  Service: Urology;  Laterality: N/A;   CYSTOSCOPY WITH BIOPSY Left 07/27/2022   Procedure: CYSTOSCOPY WITH  BLADDER BIOPSY, TRANSURETHRAL FULGERATION OF BLADDER, EXAM UNDER ANESTHESIA, LEFT RETROGRADE PYELOGRAM;  Surgeon: Heloise Purpura, MD;  Location: [facility];  Service: Urology;  Laterality: Left;   CYSTOSCOPY WITH BIOPSY N/A 04/23/2023   Procedure: CYSTOSCOPY WITH BLADDER AND PROSTATIC URETHRAL BIOPSIES;  Surgeon: Heloise Purpura, MD;  Location: [facility];  Service: Urology;  Laterality: N/A;  60 MINUTES NEEDED FOR CASE   CYSTOSCOPY WITH URETEROSCOPY AND STENT PLACEMENT Right 12/15/2019   Procedure: CYSTOSCOPY WITH RIGHT RETROGRADE/ RIGHT URETEROSCOPY/ BIOPSY;  Surgeon: Ihor Gully, MD;  Location: [facility] [place];  Service: Urology;  Laterality: Right;   CYSTOSCOPY/RETROGRADE/URETEROSCOPY Bilateral 03/18/2018   Procedure: CYSTOSCOPY/RETROGRADE/URETEROSCOPY.;  Surgeon: Ihor Gully, MD;  Location: [facility];  Service: Urology;  Laterality: Bilateral;   EYE SURGERY Bilateral    cateract in January 2023   HYDROCELE EXCISION Left 07/27/2022   Procedure: HYDROCELE REPAIR;  Surgeon: Heloise Purpura, MD;  Location: [facility];  Service: Urology;  Laterality: Left;  GENERAL ANESTHESIA WITH PARALYSIS   KNEE ARTHROSCOPY Right    LEFT ATRIAL APPENDAGE OCCLUSION N/A 12/13/2023   Procedure: LEFT ATRIAL APPENDAGE OCCLUSION;  Surgeon: Nobie Putnam, MD;  Location: [facility] INVASIVE CV LAB;  Service: Cardiovascular;  Laterality: N/A;   LEFT HEART CATHETERIZATION WITH CORONARY ANGIOGRAM N/A 02/04/2014   Procedure: LEFT HEART CATHETERIZATION WITH CORONARY ANGIOGRAM;  Surgeon: Iran Ouch, MD;  Location: [facility] CATH LAB;  Service: Cardiovascular;  Laterality: N/A;  severe one-vessel CAD, chronically occluded RCA with faint left-to-right collaterals;  aneurysmal LCFx with sluggish coronary flow;  normal LVSF w/ moderately elevated LVEDP (ostialOM2 20%, pOM3 20%, pD1 20%, mCFX 50%,  diffuse 20% pCFX)   PROSTATE BIOPSY N/A 08/12/2020   Procedure: BIOPSY TRANSRECTAL ULTRASONIC PROSTATE (TUBP);  Surgeon: Heloise Purpura, MD;  Location: [facility];  Service: Urology;  Laterality: N/A;   ROBOT ASSITED LAPAROSCOPIC NEPHROURETERECTOMY Right 03/22/2020   Procedure: XI ROBOT ASSITED LAPAROSCOPIC NEPHROURETERECTOMY;  Surgeon: Heloise Purpura, MD;  Location: [facility];  Service: Urology;  Laterality: Right;   TRANSESOPHAGEAL ECHOCARDIOGRAM (CATH LAB) N/A 12/13/2023   Procedure: TRANSESOPHAGEAL ECHOCARDIOGRAM;  Surgeon: Nobie Putnam, MD;  Location: [facility] INVASIVE CV LAB;  Service: Cardiovascular;  Laterality: N/A;   TRANSTHORACIC ECHOCARDIOGRAM  11-22-2016   dr hochrein   ef 55-60%/ mild MR and TR/ moderate LAE   TRANSURETHRAL RESECTION OF BLADDER TUMOR N/A 02/10/2021   Procedure: TRANSURETHRAL RESECTION OF BLADDER TUMOR (TURBT)/ CYSTOSCOPY/ LEFT RETROGRADE;  Surgeon: Heloise Purpura, MD;  Location: [facility];  Service: Urology;  Laterality: N/A;  GENERAL ANESTHESIA WITH PARALYSIS   Family History  Problem Relation Age of Onset   Hypertension Mother    Cancer Mother        anal cancer   Esophageal cancer Neg Hx    Pancreatic cancer Neg Hx    Stomach cancer Neg Hx    Liver disease Neg Hx    Outpatient Medications Prior to Visit  Medication Sig Dispense Refill   amLODipine (NORVASC) 5 MG tablet TAKE 1 TABLET (5 MG TOTAL) BY MOUTH DAILY 90 tablet 3   Cholecalciferol (VITAMIN D3)  1000 units CAPS Take 1,000 Units by mouth daily.     clopidogrel (PLAVIX) 75 MG tablet Take 1 tablet (75 mg total) by mouth daily. 90 tablet 3   furosemide (LASIX) 20 MG tablet Take 1 tablet (20 mg total) by mouth daily. 90 tablet 3   hydrOXYzine (VISTARIL) 25 MG capsule Take 1 capsule (25 mg total) by mouth every 8 (eight) hours as needed. 30 capsule 2   isosorbide mononitrate (IMDUR) 30 MG 24 hr tablet TAKE ONE TABLET BY MOUTH DAILY 90 tablet 3   JANUVIA 25 MG tablet TAKE ONE TABLET BY MOUTH DAILY 60 tablet 0    metoprolol tartrate (LOPRESSOR) 25 MG tablet TAKE ONE TABLET BY MOUTH TWICE A DAY 180 tablet 2   nitroGLYCERIN (NITROSTAT) 0.4 MG SL tablet TAKE ONE TABLET UNDER THE TONGUE EVERY FIVE MINUTES FOR THREE DOSES AS NEEDED FOR CHEST PAIN, CALL 911 IF 2ND DOSE DOESN'T HELP 25 tablet 4   olmesartan (BENICAR) 40 MG tablet Take 40 mg by mouth daily.     oxybutynin (DITROPAN XL) 15 MG 24 hr tablet Take 1 tablet (15 mg total) by mouth 2 (two) times daily as needed. 60 tablet 1   Pembrolizumab (KEYTRUDA IV) Inject into the vein.     phenazopyridine (PYRIDIUM) 200 MG tablet Take 1 tablet (200 mg total) by mouth 3 (three) times daily as needed for pain. 30 tablet 1   phenylephrine (NEO-SYNEPHRINE) 1 % nasal spray Place 1 drop into both nostrils every 6 (six) hours as needed for congestion.     rosuvastatin (CRESTOR) 40 MG tablet TAKE 1 TABLET (40 MG TOTAL) BY MOUTH EVERY MORNING. PLEASE KEEP SCHEDULED APPOINTMENT 90 tablet 3   sodium bicarbonate 650 MG tablet Take 650 mg by mouth 2 (two) times daily.     tamsulosin (FLOMAX) 0.4 MG CAPS capsule Take by mouth.     traZODone (DESYREL) 50 MG tablet Take 0.5-1 tablets (25-50 mg total) by mouth at bedtime as needed for sleep. 30 tablet 3   No facility-administered medications prior to visit.   Allergies  Allergen Reactions   Xarelto [Rivaroxaban] Other (See Comments)    Skin Blisters    Objective:   Today's Vitals   01/25/24 1009  BP: 136/82  Pulse: 98  Temp: 97.8 F (36.6 C)  TempSrc: Temporal  SpO2: 95%  Weight: 219 lb 3.2 oz (99.4 kg)  Height: 5\' 11"  (1.803 m)   Body mass index is 30.57 kg/m.   General: Well developed, well nourished. No acute distress. Extremities: 4+ edema noted. Psych: Alert and oriented. Normal mood and affect.    Assessment & Plan:   Problem List Items Addressed This Visit       Cardiovascular and Mediastinum   Atrial fibrillation with RVR (HCC)   Rate controlled currently. Continue metoprolol tartrate 25 mg bid.  Recent Watchman procedure.      Chronic diastolic CHF (congestive heart failure), NYHA class 2 (HCC) - Primary   Mr. Cordial continues to show signs that he is fluid overloaded. Continue metoprolol tartrate 25 mg twice daily, olmesartan 40 mg daily, dapagliflozin 10 mg daily. I urge him to take his furosemide 20 mg two tablets daily to help the swelling in his legs.      Type 2 diabetes mellitus with cardiac complication (HCC)   Stable. Continue sitagliptin (Januvia) 25 mg daily and dapagliflozin 10 mg daily.        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 He is discussing potential for urostomy down the road if they cannot get his bladder cancer to respond to his current treatments.        Other   Dysuria   Appears to be secondary to his urothelial cancer. Continue phenazopyridine.      Presence of Watchman left atrial appendage closure device    Return in about 3 months (around 04/26/2024) for Reassessment.   Loyola Mast, MD

## 2024-01-25 NOTE — Assessment & Plan Note (Signed)
 Stable. Continue sitagliptin (Januvia) 25 mg daily and dapagliflozin 10 mg daily.

## 2024-01-25 NOTE — Assessment & Plan Note (Signed)
 Appears to be secondary to his urothelial cancer. Continue phenazopyridine.

## 2024-01-25 NOTE — Assessment & Plan Note (Signed)
 Continue under care of urology. He is discussing potential for urostomy down the road if they cannot get his bladder cancer to respond to his current treatments.

## 2024-01-25 NOTE — Assessment & Plan Note (Signed)
 Adam Harris continues to show signs that he is fluid overloaded. Continue metoprolol tartrate 25 mg twice daily, olmesartan 40 mg daily, dapagliflozin 10 mg daily. I urge him to take his furosemide 20 mg two tablets daily to help the swelling in his legs.

## 2024-01-25 NOTE — Assessment & Plan Note (Signed)
 Rate controlled currently. Continue metoprolol tartrate 25 mg bid. Recent Watchman procedure.

## 2024-01-30 ENCOUNTER — Other Ambulatory Visit: Payer: Self-pay | Admitting: Nurse Practitioner

## 2024-01-30 DIAGNOSIS — C679 Malignant neoplasm of bladder, unspecified: Secondary | ICD-10-CM

## 2024-01-30 NOTE — Progress Notes (Unsigned)
 Patient Care Team: Loyola Mast, MD as PCP - General (Family Medicine) Rollene Rotunda, MD as PCP - Cardiology (Cardiology) Nobie Putnam, MD as PCP - Electrophysiology (Cardiology) Heloise Purpura, MD as Consulting Physician (Urology) Rollene Rotunda, MD as Consulting Physician (Cardiology) Estanislado Emms, MD as Consulting Physician (Nephrology) Malachy Mood, MD as Consulting Physician (Hematology and Oncology) [facility]  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  He has had multiple BCG maintenance therapy, is BCG refractory. -Given his BCG refractory disease, recurrence in bladder and the urethra, I recommend systemic therapy with Keytruda every 3 weeks (or every 6 weeks if he tolerates well) for 2 years, per NCCN guideline.  He had no history of autoimmune disease, overall in good health, there is no contraindication for immunotherapy. --he started on 06/15/2023. C4 was postponed due to dyspnea and hypoxia on exertion, CT chest was negative for pneumonitis  -repeated cystoscopy and cytology was still positive in early Nov 2024. This was discussed with Dr. Laverle Patter and decision made to continue Keytruda for additional 3 months and re-evaluate.  -new cytoscopy done 01/2024 per Dr. Laverle Patter suspicious for resistant bladder cancer. More extensive biopsies to be done later this month. Will adjust further treatments based on biopsy results. Today, proceed with [place] as scheduled.    Dysuria Patient with  significant dysuria and urinary frequency today.  Likely from worsening and resistant bladder cancer.  Urine sample will be obtained to evaluate for infection and will treat as indicated.  If positive for infection, will send for culture and sensitivity and adjust treatment as indicated.  Patient continues to see urology on regular basis and will be scheduled for bladder biopsy in the near future.  Lower extremity edema Patient with bilateral 2+ pitting edema of lower extremities.  Patient taking furosemide infrequently due to increase in urinary frequency.  He is scheduled for TEE on 02/11/2024.  Recently had Watchman device placed, has chronic A-fib.  Fatigue Likely multifactorial, bladder cancer, Keytruda, and cardiac dysfunction, all playing a role.  Plan: Reviewed labs.  CBC unremarkable.  CMP showing moderate but stable kidney disease.  T. bili 2.7. Adequate for Keytruda treatment today. Will treat UTI if indicated. Patient to follow-up with urology and cardiology as scheduled. Will see patient back after new bladder biopsy has been obtained and plan for further treatment at that time.  The patient understands the plans discussed today and is in agreement with them.  He knows to contact our office if he develops concerns prior to his next appointment.  I provided 30 minutes of face-to-face time during this encounter and > 50% was spent counseling as documented under my assessment and plan.    Carlean Jews, NP  [facility] - A DEPT OF [facility] [address] [place] [address] Dept: [phone] Dept Fax: [phone]   Orders Placed This Encounter  Procedures   Urine Culture    Standing Status:   Future  Number of Occurrences:   1    Expected Date:   01/31/2024    Expiration Date:   01/30/2025   Urinalysis, Complete w Microscopic    Standing Status:   Future    Number of Occurrences:   1    Expected  Date:   01/31/2024    Expiration Date:   01/30/2025      CHIEF COMPLAINT:  CC: Recurrent bladder cancer  Current Treatment: Keytruda every 3 weeks  INTERVAL HISTORY:  Adam Harris is here today for repeat clinical assessment.  Last visit with Dr. Mosetta Putt was 01/10/2024.  Repeat cystoscopy done March 2025 was suspicious for abnormal cells.  Dr. Laverle Patter does plan to do more extensive cystoscopy with biopsies under anesthesia.  This will be coordinated with cardiology who is planning to do TEE on 02/11/2024.  Him to be off Plavix to reduce risk of bleeding during procedures.  Proceed with Keytruda today.  Will postpone additional treatment until after new bladder biopsies have returned. He .denies chest pain, chest pressure, or shortness of breath.  He does have significant bilateral lower extremity swelling.  Legs feel very tight though they are not tender.  He feels very heavy.  He denies headaches or visual disturbances. He denies abdominal pain, nausea, vomiting, or changes in bowel habits.  He does have worsening urinary frequency with dysuria during the night.  States he is getting up to urinate during the day every 5 minutes.  At night, it is about 30 minutes in between trips to the bathroom.  He denies fevers or chills. He denies pain. His appetite is decreasing. His weight has increased 6 pounds over last 2 weeks .  I have reviewed the past medical history, past surgical history, social history and family history with the patient and they are unchanged from previous note.  ALLERGIES:  is allergic to xarelto [rivaroxaban].  MEDICATIONS:  Current Outpatient Medications  Medication Sig Dispense Refill   amLODipine (NORVASC) 5 MG tablet TAKE 1 TABLET (5 MG TOTAL) BY MOUTH DAILY 90 tablet 3   Cholecalciferol (VITAMIN D3) 1000 units CAPS Take 1,000 Units by mouth daily.     clopidogrel (PLAVIX) 75 MG tablet Take 1 tablet (75 mg total) by mouth daily. 90 tablet 3   furosemide (LASIX) 20 MG tablet Take 1 tablet  (20 mg total) by mouth daily. 90 tablet 3   hydrOXYzine (VISTARIL) 25 MG capsule Take 1 capsule (25 mg total) by mouth every 8 (eight) hours as needed. 30 capsule 2   isosorbide mononitrate (IMDUR) 30 MG 24 hr tablet TAKE ONE TABLET BY MOUTH DAILY 90 tablet 3   JANUVIA 25 MG tablet TAKE ONE TABLET BY MOUTH DAILY 60 tablet 0   metoprolol tartrate (LOPRESSOR) 25 MG tablet TAKE ONE TABLET BY MOUTH TWICE A DAY 180 tablet 2   nitroGLYCERIN (NITROSTAT) 0.4 MG SL tablet TAKE ONE TABLET UNDER THE TONGUE EVERY FIVE MINUTES FOR THREE DOSES AS NEEDED FOR CHEST PAIN, CALL 911 IF 2ND DOSE DOESN'T HELP 25 tablet 4   olmesartan (BENICAR) 40 MG tablet Take 40 mg by mouth daily.     oxybutynin (DITROPAN XL) 15 MG 24 hr tablet Take 1 tablet (15 mg total) by mouth 2 (two) times daily as needed. 60 tablet 1   Pembrolizumab (KEYTRUDA IV) Inject into the vein.     phenazopyridine (PYRIDIUM) 200 MG tablet Take 1 tablet (200 mg total) by mouth 3 (three) times daily as needed for pain. 30 tablet 1   phenylephrine (  NEO-SYNEPHRINE) 1 % nasal spray Place 1 drop into both nostrils every 6 (six) hours as needed for congestion.     rosuvastatin (CRESTOR) 40 MG tablet TAKE 1 TABLET (40 MG TOTAL) BY MOUTH EVERY MORNING. PLEASE KEEP SCHEDULED APPOINTMENT 90 tablet 3   sodium bicarbonate 650 MG tablet Take 650 mg by mouth 2 (two) times daily.     tamsulosin (FLOMAX) 0.4 MG CAPS capsule Take by mouth.     traZODone (DESYREL) 50 MG tablet Take 0.5-1 tablets (25-50 mg total) by mouth at bedtime as needed for sleep. 30 tablet 3   No current facility-administered medications for this visit.    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 Mild degradation secondary to lack of IV contrast and motion. 2. Similar to decrease in mild right-sided bladder wall thickening, nonspecific in the setting of prior bladder cancer. Most likely treatment related. 3. Borderline left external iliac and right inguinal adenopathy, decreased. This could represent response to therapy of metastasis or be reactive. 4. No evidence of abdominal metastasis or recurrent disease, status post right nephrectomy.   03/22/2023 Imaging   MR abdomen without contrast  IMPRESSION: 1. Mild degradation secondary to lack of IV contrast and motion. 2. Similar to decrease in mild right-sided bladder wall thickening, nonspecific in the setting of prior bladder cancer. Most likely treatment related. 3. Borderline left external iliac and right inguinal adenopathy, decreased. This could represent response to therapy of metastasis or be reactive. 4. No evidence of abdominal metastasis or recurrent disease, status post right nephrectomy.     06/08/2023 Cancer Staging   Staging form: Urinary Bladder, AJCC 8th Edition - Clinical stage from 06/08/2023: Stage 0is (cTis, cN0, cM0) - Signed by Malachy Mood, MD on 06/09/2023 WHO/ISUP grade (low/high): High Grade Histologic grading system: 2 grade system   07/31/2023 Imaging   CT chest without contrast   IMPRESSION: No findings suspicious for pneumonitis.  No evidence of metastatic disease.  Aortic Atherosclerosis (ICD10-I70.0).   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  Severe fatigue. Eyes: Denies blurriness of vision Ears, nose, mouth, throat, and face: Denies mucositis or sore throat Respiratory: Denies cough, dyspnea or wheezes Cardiovascular: Denies palpitation or chest discomfort.  He does have severe bilateral lower extremity swelling. Gastrointestinal:  Denies nausea, heartburn or change in bowel habits Skin: Denies abnormal skin rashes Lymphatics: Denies new lymphadenopathy or easy bruising Neurological:Denies numbness, tingling or new weaknesses Behavioral/Psych: Mood is stable, no new changes  Genitourinary: He has worsening urinary frequency.  Has dysuria at night also All other systems were reviewed with the patient and are negative.   VITALS:   Today's Vitals   01/31/24  1317  BP: 138/80  Pulse: 93  Resp: 18  Temp: 97.8 F (36.6 C)  SpO2: 97%  Weight: 219 lb 6.4 oz (99.5 kg)  PainSc: 5    Body mass index is 30.6 kg/m.   Wt Readings from Last 3 Encounters:  01/31/24 219 lb 6.4 oz (99.5 kg)  01/25/24 219 lb 3.2 oz (99.4 kg)  01/16/24 213 lb (96.6 kg)    Body mass index is 30.6 kg/m.  Performance status (ECOG): 1 - Symptomatic but completely ambulatory  PHYSICAL EXAM:   GENERAL:alert, no distress and comfortable.  He appears fatigued. SKIN: skin color, texture, turgor are normal, no rashes or significant lesions.  EYES: normal, Conjunctiva are pink and non-injected, sclera clear OROPHARYNX:no exudate, no erythema and lips, buccal mucosa, and tongue normal  NECK: supple, thyroid normal size, non-tender, without nodularity LYMPH:  no palpable lymphadenopathy in the cervical, axillary or inguinal LUNGS: clear to auscultation and percussion with normal breathing effort HEART: He has a regular heart rhythm and rate.  He has 2+ pitting edema without tenderness.  No warmth and no redness noted to bilateral lower extremities. ABDOMEN:abdomen soft, non-tender and normal bowel  sounds Musculoskeletal:no cyanosis of digits and no clubbing  NEURO: alert & oriented x 3 with fluent speech, no focal motor/sensory deficits  LABORATORY DATA:  I have reviewed the data as listed    Component Value Date/Time   NA 137 01/31/2024 1237   NA 141 11/05/2023 1104   K 4.2 01/31/2024 1237   CL 108 01/31/2024 1237   CO2 23 01/31/2024 1237   GLUCOSE 133 (H) 01/31/2024 1237   BUN 35 (H) 01/31/2024 1237   BUN 24 11/05/2023 1104   CREATININE 1.79 (H) 01/31/2024 1237   CREATININE 1.77 (H) 01/01/2017 0810   CALCIUM 9.3 01/31/2024 1237   PROT 7.6 01/31/2024 1237   ALBUMIN 3.8 01/31/2024 1237   AST 17 01/31/2024 1237   ALT 11 01/31/2024 1237   ALKPHOS 105 01/31/2024 1237   BILITOT 2.7 (H) 01/31/2024 1237   GFRNONAA 39 (L) 01/31/2024 1237   GFRAA 32 (L) 08/05/2020 0826    Lab Results  Component Value Date   WBC 10.5 01/31/2024   NEUTROABS 7.7 01/31/2024   HGB 13.5 01/31/2024   HCT 41.1 01/31/2024   MCV 83.9 01/31/2024   PLT 185 01/31/2024

## 2024-01-30 NOTE — Assessment & Plan Note (Signed)
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  He has had multiple BCG maintenance therapy, is BCG refractory. -Given his BCG refractory disease, recurrence in bladder and the urethra, I recommend systemic therapy with Keytruda every 3 weeks (or every 6 weeks if he tolerates well) for 2 years, per NCCN guideline.  He had no history of autoimmune disease, overall in good health, there is no contraindication for immunotherapy. --he started on 06/15/2023. C4 was postponed due to dyspnea and hypoxia on exertion, CT chest was negative for pneumonitis  -repeated cystoscopy and cytology was still positive in early Nov 2024. This was discussed with Dr. Laverle Patter and decision made to continue Keytruda for additional 3 months and re-evaluate.  -new cytoscopy done 01/2024 per Dr. Laverle Patter suspicious for resistant bladder cancer. More extensive biopsies to be done later this month. Will adjust further treatments based on biopsy results. Today, proceed with [place] as scheduled.

## 2024-01-31 ENCOUNTER — Inpatient Hospital Stay: Payer: Medicare PPO

## 2024-01-31 ENCOUNTER — Encounter: Payer: Self-pay | Admitting: Nurse Practitioner

## 2024-01-31 ENCOUNTER — Inpatient Hospital Stay (HOSPITAL_BASED_OUTPATIENT_CLINIC_OR_DEPARTMENT_OTHER): Payer: Medicare PPO | Admitting: Nurse Practitioner

## 2024-01-31 ENCOUNTER — Inpatient Hospital Stay: Payer: Medicare PPO | Attending: Hematology

## 2024-01-31 VITALS — BP 138/80 | HR 93 | Temp 97.8°F | Resp 18 | Wt 219.4 lb

## 2024-01-31 DIAGNOSIS — R300 Dysuria: Secondary | ICD-10-CM | POA: Insufficient documentation

## 2024-01-31 DIAGNOSIS — Z79899 Other long term (current) drug therapy: Secondary | ICD-10-CM | POA: Diagnosis not present

## 2024-01-31 DIAGNOSIS — C689 Malignant neoplasm of urinary organ, unspecified: Secondary | ICD-10-CM

## 2024-01-31 DIAGNOSIS — N390 Urinary tract infection, site not specified: Secondary | ICD-10-CM | POA: Diagnosis not present

## 2024-01-31 DIAGNOSIS — Z5112 Encounter for antineoplastic immunotherapy: Secondary | ICD-10-CM | POA: Insufficient documentation

## 2024-01-31 DIAGNOSIS — C679 Malignant neoplasm of bladder, unspecified: Secondary | ICD-10-CM | POA: Diagnosis not present

## 2024-01-31 DIAGNOSIS — R319 Hematuria, unspecified: Secondary | ICD-10-CM

## 2024-01-31 DIAGNOSIS — Z7902 Long term (current) use of antithrombotics/antiplatelets: Secondary | ICD-10-CM | POA: Diagnosis not present

## 2024-01-31 DIAGNOSIS — I482 Chronic atrial fibrillation, unspecified: Secondary | ICD-10-CM | POA: Diagnosis not present

## 2024-01-31 DIAGNOSIS — C7919 Secondary malignant neoplasm of other urinary organs: Secondary | ICD-10-CM | POA: Insufficient documentation

## 2024-01-31 LAB — URINALYSIS, COMPLETE (UACMP) WITH MICROSCOPIC
Bilirubin Urine: NEGATIVE
Glucose, UA: NEGATIVE mg/dL
Ketones, ur: NEGATIVE mg/dL
Nitrite: NEGATIVE
Protein, ur: 100 mg/dL — AB
RBC / HPF: >50 RBC/hpf (ref 0–5)
Specific Gravity, Urine: 1.011 (ref 1.005–1.030)
WBC, UA: >50 WBC/hpf (ref 0–5)
pH: 6 (ref 5.0–8.0)

## 2024-01-31 LAB — CBC WITH DIFFERENTIAL (CANCER CENTER ONLY)
Abs Immature Granulocytes: 0.03 10*3/uL (ref 0.00–0.07)
Basophils Absolute: 0.1 10*3/uL (ref 0.0–0.1)
Basophils Relative: 1 %
Eosinophils Absolute: 0.2 10*3/uL (ref 0.0–0.5)
Eosinophils Relative: 2 %
HCT: 41.1 % (ref 39.0–52.0)
Hemoglobin: 13.5 g/dL (ref 13.0–17.0)
Immature Granulocytes: 0 %
Lymphocytes Relative: 11 %
Lymphs Abs: 1.1 10*3/uL (ref 0.7–4.0)
MCH: 27.6 pg (ref 26.0–34.0)
MCHC: 32.8 g/dL (ref 30.0–36.0)
MCV: 83.9 fL (ref 80.0–100.0)
Monocytes Absolute: 1.3 10*3/uL — ABNORMAL HIGH (ref 0.1–1.0)
Monocytes Relative: 13 %
Neutro Abs: 7.7 10*3/uL (ref 1.7–7.7)
Neutrophils Relative %: 73 %
Platelet Count: 185 10*3/uL (ref 150–400)
RBC: 4.9 MIL/uL (ref 4.22–5.81)
RDW: 16.9 % — ABNORMAL HIGH (ref 11.5–15.5)
WBC Count: 10.5 10*3/uL (ref 4.0–10.5)
nRBC: 0 % (ref 0.0–0.2)

## 2024-01-31 LAB — T4, FREE: Free T4: 1 ng/dL (ref 0.61–1.12)

## 2024-01-31 LAB — CMP (CANCER CENTER ONLY)
ALT: 11 U/L (ref 0–44)
AST: 17 U/L (ref 15–41)
Albumin: 3.8 g/dL (ref 3.5–5.0)
Alkaline Phosphatase: 105 U/L (ref 38–126)
Anion gap: 6 (ref 5–15)
BUN: 35 mg/dL — ABNORMAL HIGH (ref 8–23)
CO2: 23 mmol/L (ref 22–32)
Calcium: 9.3 mg/dL (ref 8.9–10.3)
Chloride: 108 mmol/L (ref 98–111)
Creatinine: 1.79 mg/dL — ABNORMAL HIGH (ref 0.61–1.24)
GFR, Estimated: 39 mL/min — ABNORMAL LOW (ref >60)
Glucose, Bld: 133 mg/dL — ABNORMAL HIGH (ref 70–99)
Potassium: 4.2 mmol/L (ref 3.5–5.1)
Sodium: 137 mmol/L (ref 135–145)
Total Bilirubin: 2.7 mg/dL — ABNORMAL HIGH (ref 0.0–1.2)
Total Protein: 7.6 g/dL (ref 6.5–8.1)

## 2024-01-31 LAB — TSH: TSH: 4.886 u[IU]/mL — ABNORMAL HIGH (ref 0.350–4.500)

## 2024-01-31 MED ORDER — SODIUM CHLORIDE 0.9 % IV SOLN: Freq: Once | INTRAVENOUS | Status: AC

## 2024-01-31 MED ORDER — SODIUM CHLORIDE 0.9 % IV SOLN
200.0000 mg | Freq: Once | INTRAVENOUS | Status: AC
  Administered 2024-01-31: 200 mg via INTRAVENOUS
  Filled 2024-01-31: qty 8

## 2024-01-31 MED ORDER — NITROFURANTOIN MONOHYD MACRO 100 MG PO CAPS: 100.0000 mg | ORAL_CAPSULE | Freq: Two times a day (BID) | ORAL | 0 refills | Status: DC

## 2024-01-31 NOTE — Patient Instructions (Signed)

## 2024-02-01 ENCOUNTER — Telehealth: Payer: Self-pay

## 2024-02-01 DIAGNOSIS — N183 Chronic kidney disease, stage 3 unspecified: Secondary | ICD-10-CM | POA: Diagnosis not present

## 2024-02-01 DIAGNOSIS — Z905 Acquired absence of kidney: Secondary | ICD-10-CM | POA: Diagnosis not present

## 2024-02-01 DIAGNOSIS — I129 Hypertensive chronic kidney disease with stage 1 through stage 4 chronic kidney disease, or unspecified chronic kidney disease: Secondary | ICD-10-CM | POA: Diagnosis not present

## 2024-02-01 DIAGNOSIS — N184 Chronic kidney disease, stage 4 (severe): Secondary | ICD-10-CM | POA: Diagnosis not present

## 2024-02-01 DIAGNOSIS — I2510 Atherosclerotic heart disease of native coronary artery without angina pectoris: Secondary | ICD-10-CM | POA: Diagnosis not present

## 2024-02-01 DIAGNOSIS — R809 Proteinuria, unspecified: Secondary | ICD-10-CM | POA: Diagnosis not present

## 2024-02-01 DIAGNOSIS — N390 Urinary tract infection, site not specified: Secondary | ICD-10-CM | POA: Diagnosis not present

## 2024-02-01 DIAGNOSIS — N400 Enlarged prostate without lower urinary tract symptoms: Secondary | ICD-10-CM | POA: Diagnosis not present

## 2024-02-01 DIAGNOSIS — E1122 Type 2 diabetes mellitus with diabetic chronic kidney disease: Secondary | ICD-10-CM | POA: Diagnosis not present

## 2024-02-01 DIAGNOSIS — H44539 Leucocoria, unspecified eye: Secondary | ICD-10-CM | POA: Diagnosis not present

## 2024-02-01 LAB — URINE CULTURE

## 2024-02-01 NOTE — Telephone Encounter (Signed)
 Called patient to relay message below as per Vincent Gros NP, patient voiced full understanding and had no further questions at this time.  please let the patient know that his urine sample looks positive for infection. I added macrobid 100 mg twice daily for 10 days. the sample will be sent for culture and sensitivity and I will adjust antibiotics if needed. thank you. sorry for the last minute messages.   Vincent Gros NP

## 2024-02-04 ENCOUNTER — Other Ambulatory Visit: Payer: Self-pay

## 2024-02-08 NOTE — Progress Notes (Signed)
 Spoke to patient and instructed them to come at 09:00  and to be NPO after 0000. Medications reviewed.   Confirmed that patient will have a ride home and someone to stay with them for 24 hours after the procedure.

## 2024-02-10 NOTE — Anesthesia Preprocedure Evaluation (Addendum)
 Anesthesia Evaluation  Patient identified by MRN, date of birth, ID band Patient awake    Reviewed: Allergy & Precautions, NPO status , Patient's Chart, lab work & pertinent test results  History of Anesthesia Complications Negative for: history of anesthetic complications  Airway Mallampati: II  TM Distance: >3 FB Neck ROM: Full    Dental  (+) Dental Advisory Given, Edentulous Upper, Edentulous Lower   Pulmonary neg shortness of breath, neg sleep apnea, neg COPD, neg recent URI, former smoker   Pulmonary exam normal breath sounds clear to auscultation       Cardiovascular hypertension, Pt. on medications and Pt. on home beta blockers + CAD, + Past MI and +CHF  + dysrhythmias Atrial Fibrillation  Rhythm:Irregular Rate:Normal  TEE (Watchman) 11/2023  1. TEE guided LAA closure. 27 mm Watchman FLX placed without device leak.  29% compression noted. 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 Trivial pericardial effusion before and after the case. No immediate complications. 3D echo was used to size the watcman device and confirm appropriate deployment.   2. Left ventricular ejection fraction, by estimation, is 60 to 65%. The left ventricle has normal function.   3. Right ventricular systolic function is normal. The right ventricular size is mildly enlarged.   4. Left atrial size was mildly dilated. No left atrial/left atrial appendage thrombus was detected.   5. The mitral valve is normal in structure. Mild mitral valve regurgitation. No evidence of mitral stenosis.   6. The aortic valve is tricuspid. Aortic valve regurgitation is trivial. No aortic stenosis is present.   7. There is mild (Grade II) layered plaque involving the descending aorta.    Echo 05/2022  1. Left ventricular ejection fraction, by estimation, is 60 to 65%. The left ventricle has normal function. The left ventricle has no regional wall motion  abnormalities. There is moderate left ventricular hypertrophy of the basal-septal segment. Left ventricular diastolic parameters are indeterminate.   2. Right ventricular systolic function is normal. The right ventricular size is normal. There is mildly elevated pulmonary artery systolic pressure.   3. Left atrial size was mildly dilated.   4. The mitral valve is normal in structure. Mild mitral valve regurgitation. No evidence of mitral stenosis.   5. The aortic valve is tricuspid. There is mild calcification of the aortic valve. Aortic valve regurgitation is not visualized. Aortic valve sclerosis is present, with no evidence of aortic valve stenosis.   6. The inferior vena cava is normal in size with greater than 50% respiratory variability, suggesting right atrial pressure of 3 mmHg.     Neuro/Psych neg Seizures  Anxiety      Neuromuscular disease    GI/Hepatic negative GI ROS, Neg liver ROS,,,  Endo/Other  diabetes  Lab Results      Component                Value               Date                      HGBA1C                   6.7 (H)             08/06/2023             Renal/GU CRFRenal diseaseLab Results      Component  Value               Date                      NA                       141                 11/22/2023                K                        4.2                 11/22/2023                CO2                      23                  11/22/2023                GLUCOSE                  183 (H)             11/22/2023                BUN                      30 (H)              11/22/2023                CREATININE               2.19 (H)            11/22/2023                CALCIUM                  9.6                 11/22/2023                GFR                      26.55 (L)           05/02/2022                EGFR                     34 (L)              11/05/2023                GFRNONAA                 30 (L)              11/22/2023             S/p  right kidney removal     Musculoskeletal  (+) Arthritis , Osteoarthritis,    Abdominal  (+) + obese  Peds  Hematology negative hematology ROS (+) Lab Results      Component  Value               Date                      WBC                      8.5                 11/22/2023                HGB                      14.6                11/22/2023                HCT                      44.4                11/22/2023                MCV                      87.9                11/22/2023                PLT                      171                 11/22/2023                        Anesthesia Other Findings   Reproductive/Obstetrics                             Anesthesia Physical Anesthesia Plan  ASA: 3  Anesthesia Plan: MAC   Post-op Pain Management: Minimal or no pain anticipated   Induction: Intravenous  PONV Risk Score and Plan: 2 and Treatment may vary due to age or medical condition, Propofol infusion and TIVA  Airway Management Planned: Simple Face Mask  Additional Equipment:   Intra-op Plan:   Post-operative Plan:   Informed Consent: I have reviewed the patients History and Physical, chart, labs and discussed the procedure including the risks, benefits and alternatives for the proposed anesthesia with the patient or authorized representative who has indicated his/her understanding and acceptance.     Dental advisory given  Plan Discussed with: CRNA  Anesthesia Plan Comments:        Anesthesia Quick Evaluation

## 2024-02-11 ENCOUNTER — Other Ambulatory Visit: Payer: Self-pay

## 2024-02-11 ENCOUNTER — Encounter (HOSPITAL_COMMUNITY): Payer: Self-pay | Admitting: Cardiovascular Disease

## 2024-02-11 ENCOUNTER — Ambulatory Visit (HOSPITAL_COMMUNITY): Payer: Self-pay | Admitting: Anesthesiology

## 2024-02-11 ENCOUNTER — Ambulatory Visit (HOSPITAL_BASED_OUTPATIENT_CLINIC_OR_DEPARTMENT_OTHER)
Admission: RE | Admit: 2024-02-11 | Discharge: 2024-02-11 | Disposition: A | Discharge status: Home / Self Care | Source: Ambulatory Visit | Attending: Cardiology | Admitting: Cardiology

## 2024-02-11 ENCOUNTER — Ambulatory Visit (HOSPITAL_COMMUNITY)
Admission: RE | Admit: 2024-02-11 | Discharge: 2024-02-11 | Disposition: A | Discharge status: Home / Self Care | Payer: Medicare PPO | Attending: Cardiovascular Disease | Admitting: Cardiovascular Disease

## 2024-02-11 ENCOUNTER — Telehealth: Payer: Self-pay | Admitting: Cardiology

## 2024-02-11 ENCOUNTER — Encounter (HOSPITAL_COMMUNITY)
Admission: RE | Disposition: A | Discharge status: Home / Self Care | Payer: Self-pay | Source: Home / Self Care | Attending: Cardiovascular Disease

## 2024-02-11 ENCOUNTER — Other Ambulatory Visit: Payer: Self-pay | Admitting: Urology

## 2024-02-11 DIAGNOSIS — I5032 Chronic diastolic (congestive) heart failure: Secondary | ICD-10-CM | POA: Diagnosis not present

## 2024-02-11 DIAGNOSIS — C680 Malignant neoplasm of urethra: Secondary | ICD-10-CM | POA: Insufficient documentation

## 2024-02-11 DIAGNOSIS — I4891 Unspecified atrial fibrillation: Secondary | ICD-10-CM | POA: Diagnosis not present

## 2024-02-11 DIAGNOSIS — Z7982 Long term (current) use of aspirin: Secondary | ICD-10-CM | POA: Diagnosis not present

## 2024-02-11 DIAGNOSIS — Z79899 Other long term (current) drug therapy: Secondary | ICD-10-CM | POA: Diagnosis not present

## 2024-02-11 DIAGNOSIS — I2510 Atherosclerotic heart disease of native coronary artery without angina pectoris: Secondary | ICD-10-CM | POA: Diagnosis not present

## 2024-02-11 DIAGNOSIS — Z95818 Presence of other cardiac implants and grafts: Secondary | ICD-10-CM

## 2024-02-11 DIAGNOSIS — I4821 Permanent atrial fibrillation: Secondary | ICD-10-CM | POA: Insufficient documentation

## 2024-02-11 DIAGNOSIS — I25119 Atherosclerotic heart disease of native coronary artery with unspecified angina pectoris: Secondary | ICD-10-CM | POA: Insufficient documentation

## 2024-02-11 DIAGNOSIS — I080 Rheumatic disorders of both mitral and aortic valves: Secondary | ICD-10-CM | POA: Insufficient documentation

## 2024-02-11 DIAGNOSIS — I700 Atherosclerosis of aorta: Secondary | ICD-10-CM | POA: Insufficient documentation

## 2024-02-11 DIAGNOSIS — E119 Type 2 diabetes mellitus without complications: Secondary | ICD-10-CM | POA: Insufficient documentation

## 2024-02-11 DIAGNOSIS — I252 Old myocardial infarction: Secondary | ICD-10-CM | POA: Diagnosis not present

## 2024-02-11 DIAGNOSIS — I509 Heart failure, unspecified: Secondary | ICD-10-CM | POA: Diagnosis not present

## 2024-02-11 DIAGNOSIS — N184 Chronic kidney disease, stage 4 (severe): Secondary | ICD-10-CM | POA: Insufficient documentation

## 2024-02-11 DIAGNOSIS — E1122 Type 2 diabetes mellitus with diabetic chronic kidney disease: Secondary | ICD-10-CM | POA: Insufficient documentation

## 2024-02-11 DIAGNOSIS — I110 Hypertensive heart disease with heart failure: Secondary | ICD-10-CM | POA: Diagnosis not present

## 2024-02-11 DIAGNOSIS — I10 Essential (primary) hypertension: Secondary | ICD-10-CM | POA: Insufficient documentation

## 2024-02-11 DIAGNOSIS — I130 Hypertensive heart and chronic kidney disease with heart failure and stage 1 through stage 4 chronic kidney disease, or unspecified chronic kidney disease: Secondary | ICD-10-CM | POA: Insufficient documentation

## 2024-02-11 DIAGNOSIS — I361 Nonrheumatic tricuspid (valve) insufficiency: Secondary | ICD-10-CM | POA: Diagnosis not present

## 2024-02-11 DIAGNOSIS — I340 Nonrheumatic mitral (valve) insufficiency: Secondary | ICD-10-CM | POA: Diagnosis not present

## 2024-02-11 DIAGNOSIS — Z7902 Long term (current) use of antithrombotics/antiplatelets: Secondary | ICD-10-CM | POA: Insufficient documentation

## 2024-02-11 DIAGNOSIS — E785 Hyperlipidemia, unspecified: Secondary | ICD-10-CM | POA: Insufficient documentation

## 2024-02-11 DIAGNOSIS — I2582 Chronic total occlusion of coronary artery: Secondary | ICD-10-CM | POA: Insufficient documentation

## 2024-02-11 LAB — ECHO TEE

## 2024-02-11 SURGERY — TRANSESOPHAGEAL ECHOCARDIOGRAM (TEE) (CATHLAB)
Anesthesia: Monitor Anesthesia Care

## 2024-02-11 MED ORDER — PROPOFOL 10 MG/ML IV BOLUS
INTRAVENOUS | Status: DC | PRN
  Administered 2024-02-11 (×2): 40 mg via INTRAVENOUS
  Administered 2024-02-11: 20 mg via INTRAVENOUS

## 2024-02-11 MED ORDER — SODIUM CHLORIDE 0.9 % IV SOLN: INTRAVENOUS | Status: DC | PRN

## 2024-02-11 MED ORDER — LIDOCAINE HCL (PF) 2 % IJ SOLN
INTRAMUSCULAR | Status: DC | PRN
  Administered 2024-02-11: 60 mg via INTRADERMAL

## 2024-02-11 MED ORDER — PROPOFOL 500 MG/50ML IV EMUL
INTRAVENOUS | Status: DC | PRN
  Administered 2024-02-11: 100 ug/kg/min via INTRAVENOUS

## 2024-02-11 NOTE — Anesthesia Postprocedure Evaluation (Signed)
 Anesthesia Post Note  Patient: Adam Harris  Procedure(s) Performed: TRANSESOPHAGEAL ECHOCARDIOGRAM     Patient location during evaluation: PACU Anesthesia Type: MAC Level of consciousness: awake and alert Pain management: pain level controlled Vital Signs Assessment: post-procedure vital signs reviewed and stable Respiratory status: spontaneous breathing Cardiovascular status: stable Anesthetic complications: no   No notable events documented.  Last Vitals:  Vitals:   02/11/24 1115 02/11/24 1125  BP: (!) 148/84 (!) 141/83  Pulse: 82 68  Resp: 18 13  Temp:    SpO2: 98% 98%    Last Pain:  Vitals:   02/11/24 1125  TempSrc:   PainSc: 0-No pain                 Lewie Loron

## 2024-02-11 NOTE — CV Procedure (Signed)
    TRANSESOPHAGEAL ECHOCARDIOGRAM   NAME:  Adam Harris    MRN: [id number] DOB:  Jun 19, 1946    ADMIT DATE: 02/11/2024  INDICATIONS: S/p Watchman  PROCEDURE:   Informed consent was obtained prior to the procedure. The risks, benefits and alternatives for the procedure were discussed and the patient comprehended these risks.  Risks include, but are not limited to, cough, sore throat, vomiting, nausea, somnolence, esophageal and stomach trauma or perforation, bleeding, low blood pressure, aspiration, pneumonia, infection, trauma to the teeth and death.    Procedural time out performed. The oropharynx was anesthetized with topical 1% benzocaine.    Anesthesia was administered by Dr. Twanna Hy.  The patient was administered 60 mg of propofol and 350 mg of lidocaine to achieve and maintain moderate conscious sedation.  The patient's heart rate, blood pressure, and oxygen saturation are monitored continuously during the procedure. The period of conscious sedation is 10 minutes, of which I was present face-to-face 100% of this time.   The transesophageal probe was inserted in the esophagus and stomach without difficulty and multiple views were obtained.   COMPLICATIONS:    There were no immediate complications.  KEY FINDINGS:  Normal LAAC with Watchman device. No device leak or device thrombus.  Normal LVEF 65%.  Full report to follow. Further management per primary team.   Gerri Spore T. Flora Lipps, MD, [facility]  [facility]  [address] [place], [place] [address] [phone]  10:49 AM

## 2024-02-11 NOTE — Telephone Encounter (Signed)
   Pre-operative Risk Assessment    Patient Name: Adam Harris  DOB: [birth year] MRN: [id number]   Date of last office visit: 01/16/24 Date of next office visit: 02/15/24   Request for Surgical Clearance    Procedure:   cystoscopy, bladder biopsy   Date of Surgery:  Clearance 03/06/24                                Surgeon:  Dr. Heloise Purpura   Surgeon's Group or Practice Name:  [facility] Phone number:  [phone] Fax number:  [phone]   Type of Clearance Requested:   - Medical  - Pharmacy:  Hold Clopidogrel (Plavix) 5 days   Type of Anesthesia:  General    Additional requests/questions:      Melissa Noon   02/11/2024, 4:11 PM

## 2024-02-11 NOTE — Interval H&P Note (Signed)
 History and Physical Interval Note:  02/11/2024 10:04 AM  Adam Harris  has presented today for surgery, with the diagnosis of POST WATHCMEN EVALUATION.  The various methods of treatment have been discussed with the patient and family. After consideration of risks, benefits and other options for treatment, the patient has consented to  Procedure(s): TRANSESOPHAGEAL ECHOCARDIOGRAM (N/A) as a surgical intervention.  The patient's history has been reviewed, patient examined, no change in status, stable for surgery.  I have reviewed the patient's chart and labs.  Questions were answered to the patient's satisfaction.    NPO for TEE for watchman placement.   Gerri Spore T. Flora Lipps, MD, [facility]  [facility]  [address] [place], [place] [address] [phone]  10:05 AM

## 2024-02-11 NOTE — Transfer of Care (Signed)
 Immediate Anesthesia Transfer of Care Note  Patient: Adam Harris  Procedure(s) Performed: TRANSESOPHAGEAL ECHOCARDIOGRAM  Patient Location: Cath Lab  Anesthesia Type:MAC  Level of Consciousness: drowsy, patient cooperative, and responds to stimulation  Airway & Oxygen Therapy: Patient Spontanous Breathing and Patient connected to face mask oxygen  Post-op Assessment: Report given to RN, Post -op Vital signs reviewed and stable, Patient moving all extremities, Patient moving all extremities X 4, and Patient able to stick tongue midline  Post vital signs: Reviewed and stable  Last Vitals:  Vitals Value Taken Time  BP 119/74 02/11/24 1100  Temp 36.7 C 02/11/24 1059  Pulse 72 02/11/24 1103  Resp 33 02/11/24 1103  SpO2 91 % 02/11/24 1103  Vitals shown include unfiled device data.  Last Pain:  Vitals:   02/11/24 1100  TempSrc:   PainSc: 0-No pain         Complications: No notable events documented.

## 2024-02-14 ENCOUNTER — Encounter: Payer: Self-pay | Admitting: Cardiology

## 2024-02-14 ENCOUNTER — Other Ambulatory Visit: Payer: Self-pay

## 2024-02-14 ENCOUNTER — Encounter: Payer: Self-pay | Admitting: Hematology

## 2024-02-14 ENCOUNTER — Encounter: Payer: Self-pay | Admitting: Family Medicine

## 2024-02-14 ENCOUNTER — Ambulatory Visit: Admitting: Family Medicine

## 2024-02-14 VITALS — BP 150/84 | Ht 71.0 in | Wt 218.0 lb

## 2024-02-14 DIAGNOSIS — E1159 Type 2 diabetes mellitus with other circulatory complications: Secondary | ICD-10-CM | POA: Diagnosis not present

## 2024-02-14 DIAGNOSIS — M1711 Unilateral primary osteoarthritis, right knee: Secondary | ICD-10-CM | POA: Diagnosis not present

## 2024-02-14 DIAGNOSIS — M25461 Effusion, right knee: Secondary | ICD-10-CM | POA: Diagnosis not present

## 2024-02-14 MED ORDER — METHYLPREDNISOLONE ACETATE 40 MG/ML IJ SUSP
40.0000 mg | Freq: Once | INTRAMUSCULAR | Status: AC
Start: 2024-02-14 — End: 2024-02-14
  Administered 2024-02-14: 40 mg via INTRA_ARTICULAR

## 2024-02-14 NOTE — Progress Notes (Unsigned)
 Cardiology Office Note:   Date:  02/15/2024  ID:  Romolo Sieling, DOB [birth year], MRN [id number] PCP: Loyola Mast, MD  [facility] Providers Cardiologist:  Rollene Rotunda, MD Electrophysiologist:  Nobie Putnam, MD {  History of Present Illness:   Aztlan Coll is a 78 y.o. male who presents for atrial fib and CAD and atrial fibrillation.  Most recent cardiac cath  01/2014 revealed severe 1 vessel disease with CTO RCA with faint left to right collaterals, aneurysmal LCFx with sluggish coronary flow;  normal LVSF w/ moderately elevated LVEDP (ostial OM2 20%, pOM3 20%, pD1 20%, mCFX 50%, diffuse 20% pCFX). Chronic diastolic CHF, atrial fib and HL.   He had well preserved EF on echo July 2023.     Since I last saw him he had a Watchman.  On 02/11/24 he had a TEE and he had no evidence of leak or clot.  He is doing okay today.  He has increased lower extremity swelling.  He keeps his feet down.  He is on his feet a lot.  He has not been taking his Lasix.  I am not sure he has been watching his fluid intake.  I do not think he is necessarily restricting his salt.  He is not having any new shortness of breath and has no PND or orthopnea.  He has not had any new palpitations, presyncope or syncope.  His weight is up a little bit.  ROS: As stated in the HPI and negative for all other systems.  Studies Reviewed:    EKG:   EKG Interpretation Date/Time:  Friday February 15 2024 16:29:25 EDT Ventricular Rate:  72 PR Interval:    QRS Duration:  130 QT Interval:  440 QTC Calculation: 481 R Axis:   92  Text Interpretation: Atrial fibrillation Right bundle branch block When compared with ECG of 10-Dec-2023 09:37, Right bundle branch block is now Present Confirmed by Rollene Rotunda ([id number]) on 02/15/2024 4:43:04 PM     Risk Assessment/Calculations:    CHA2DS2-VASc Score = 6   This indicates a 9.7% annual risk of stroke. The patient's score is based upon: CHF History: 1 HTN History: 1 Diabetes History:  1 Stroke History: 0 Vascular Disease History: 1 Age Score: 2 Gender Score: 0           Physical Exam:   VS:  BP 118/78 (BP Location: Left Arm, Patient Position: Sitting, Cuff Size: Normal)   Pulse 75   Ht 5\' 11"  (1.803 m)   Wt 222 lb 3.2 oz (100.8 kg)   SpO2 95%   BMI 30.99 kg/m    Wt Readings from Last 3 Encounters:  02/15/24 222 lb 3.2 oz (100.8 kg)  02/14/24 218 lb (98.9 kg)  01/31/24 219 lb 6.4 oz (99.5 kg)     GEN: Well nourished, well developed in no acute distress NECK: No JVD; No carotid bruits CARDIAC: Irregular RR, no murmurs, rubs, gallops RESPIRATORY:  Clear to auscultation without rales, wheezing or rhonchi  ABDOMEN: Soft, non-tender, non-distended EXTREMITIES: Severe bilateral leg edema; No deformity   ASSESSMENT AND PLAN:   CHRONIC ATRIAL FIB:    He is status post TEE.  He has and well-seated Watchman.  He has been given instructions that he can come off his Plavix for his urologic procedure and bridge with aspirin.  He would need to resume his Plavix afterwards.   He otherwise tolerates the atrial fibrillation and has had reasonable rate control.    CAD:   The  patient has no new sypmtoms.  No further cardiovascular testing is indicated.  We will continue with aggressive risk reduction and meds as listed.   HTN:  The blood pressure is at target.  No change in therapy.   CHRONIC DIASTOLIC HF:   He has increased lower extremity swelling.  He does not seem to have any left-sided failure.  He needs to take his Lasix which he has not been doing.  I am going to give him 40 mg for 3 days and have him come back next week for edema.  He is to keep his feet elevated.  We talked about compression stockings.  I gave him written instructions on salt and fluid restriction.  DYSLIPIDEMIA:    LDL was 45.  No change in therapy.    DM: His A1c is 6.7 and is followed by his primary provider.  This is down a little bit from previous.    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  He  has had this up to the size 2.37.  We need to watch this closely as I am trying to get his legs reduced.  I will follow the blood work as above.   PREOP: The patient is at acceptable risk for the planned surgery.  He can hold his Plavix and bridge with aspirin and then resume the Plavix after.         Follow up with APP in 3 months.   Signed, Rollene Rotunda, MD

## 2024-02-14 NOTE — Patient Instructions (Signed)

## 2024-02-14 NOTE — Telephone Encounter (Signed)
   Name: Adam Harris  DOB: November 01, 1946  MRN: [id number]   Primary Cardiologist: Rollene Rotunda, MD  Chart reviewed as part of pre-operative protocol coverage. Adam Harris was last seen on 01/16/2024 by Carlean Jews, PA-C.  Case was reviewed with Dr. Jimmey Ralph. Per Carlean Jews, "this was already reviewed by Dr. Jimmey Ralph and his recommendation was that after his TEE 3/31 (performed and look good) he would be to perform cystoscopy or biopsy anytime. Plan to transition his Plavix to aspirin 81mg  for whatever duration Dr. Laverle Patter needs for the procedure then back to Plavix when safe."  Therefore, based on ACC/AHA guidelines, the patient would be an acceptable risk for the planned procedure without further cardiovascular testing.   Per office protocol, he may hold Plavix for 5 days prior to procedure and should resume as soon as hemodynamically stable postoperatively. Patient should take Aspirin 81 mg daily throughout perioperative period. He will stop aspirin when he is able to resume Plavix postoperatively.   I will route this recommendation to the requesting party via Epic fax function and remove from pre-op pool. Please call with questions.  Carlos Levering, NP 02/14/2024, 7:33 AM

## 2024-02-14 NOTE — Progress Notes (Addendum)
 PCP: Loyola Mast, MD  Subjective:   HPI: Patient is a 78 y.o. male here for right knee swelling. 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  He has had joint aspiration of the right knee 3-4 times most recently 2 years ago.  He reports he has had to start using a cane due to the pressure and pain in his knee.  No noted fever, chills, redness around the knee joint.  He reports lower extremity edema is chronic.  He has not taken his Lasix due to it making him urinate more frequently.  He is not short of breath today and he is able to lay flat.   Past Medical History:  Diagnosis Date   Abnormal radiologic findings on diagnostic imaging of renal pelvis, ureter, or bladder    bilateral ureter abnormalities   Anticoagulant long-term use    eliquis   Anxiety    Arthritis    CAD (coronary artery disease) cardiologist-- dr hochrein   NSTEMI 02-04-2014  per cardiac cath chronic occluded RCA w/ faint left-to-right collaterals and aneurysmal LCFx with sluggish coronary flow/   NSTEMI --11-21-2016 per cardiac cath occluded proximal RCA & mid to diastal CFX 100%, med rx. 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   CKD (chronic kidney disease), stage III (HCC)    patient unaware   DOE (dyspnea on exertion)    Fatty liver    pt denies   Hematuria 02/2019   History of COVID-19 10/2019   History of non-ST elevation myocardial infarction (NSTEMI)    02-04-2014  and 11-21-2016  cardiac cath done both times ,  medically management   History of shingles 12/2017   slight pain and numbness still noted in the area   Hyperlipidemia    Hypertension    Insomnia    Myocardial infarction (HCC) 2015   Persistent atrial fibrillation (HCC)    cardiologsit-- dr hochrein   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 Jimmey Ralph   Thoracic aortic atherosclerosis (HCC)    Type 2 diabetes mellitus (HCC)    Urinary  frequency     Current Outpatient Medications on File Prior to Visit  Medication Sig Dispense Refill   amLODipine (NORVASC) 5 MG tablet TAKE 1 TABLET (5 MG TOTAL) BY MOUTH DAILY 90 tablet 3   Cholecalciferol (VITAMIN D3) 1000 units CAPS Take 1,000 Units by mouth daily.     clopidogrel (PLAVIX) 75 MG tablet Take 1 tablet (75 mg total) by mouth daily. 90 tablet 3   furosemide (LASIX) 20 MG tablet Take 1 tablet (20 mg total) by mouth daily. 90 tablet 3   hydrOXYzine (VISTARIL) 25 MG capsule Take 1 capsule (25 mg total) by mouth every 8 (eight) hours as needed. 30 capsule 2   isosorbide mononitrate (IMDUR) 30 MG 24 hr tablet TAKE ONE TABLET BY MOUTH DAILY 90 tablet 3   JANUVIA 25 MG tablet TAKE ONE TABLET BY MOUTH DAILY 60 tablet 0   metoprolol tartrate (LOPRESSOR) 25 MG tablet TAKE ONE TABLET BY MOUTH TWICE A DAY 180 tablet 2   nitrofurantoin, macrocrystal-monohydrate, (MACROBID) 100 MG capsule Take 1 capsule (100 mg total) by mouth 2 (two) times daily. 20 capsule 0   nitroGLYCERIN (NITROSTAT) 0.4 MG SL tablet TAKE ONE TABLET UNDER THE TONGUE EVERY FIVE MINUTES FOR THREE DOSES AS NEEDED FOR CHEST PAIN, CALL 911 IF 2ND DOSE DOESN'T HELP  25 tablet 4   olmesartan (BENICAR) 40 MG tablet Take 40 mg by mouth daily.     oxybutynin (DITROPAN XL) 15 MG 24 hr tablet Take 1 tablet (15 mg total) by mouth 2 (two) times daily as needed. 60 tablet 1   Pembrolizumab (KEYTRUDA IV) Inject into the vein every 14 (fourteen) days.     phenazopyridine (PYRIDIUM) 200 MG tablet Take 1 tablet (200 mg total) by mouth 3 (three) times daily as needed for pain. 30 tablet 1   phenylephrine (NEO-SYNEPHRINE) 1 % nasal spray Place 1 drop into both nostrils every 6 (six) hours as needed for congestion.     rosuvastatin (CRESTOR) 40 MG tablet TAKE 1 TABLET (40 MG TOTAL) BY MOUTH EVERY MORNING. PLEASE KEEP SCHEDULED APPOINTMENT 90 tablet 3   sodium bicarbonate 650 MG tablet Take 650 mg by mouth 2 (two) times daily.     tamsulosin  (FLOMAX) 0.4 MG CAPS capsule Take 0.4 mg by mouth daily.     traZODone (DESYREL) 50 MG tablet Take 0.5-1 tablets (25-50 mg total) by mouth at bedtime as needed for sleep. 30 tablet 3   No current facility-administered medications on file prior to visit.    Past Surgical History:  Procedure Laterality Date   CARDIAC CATHETERIZATION N/A 11/21/2016   Procedure: Left Heart Cath and Coronary Angiography;  Surgeon: Lennette Bihari, MD;  Location: [facility] INVASIVE CV LAB;  Service: Cardiovascular;  Laterality: N/A;  pRCA 100% , ostial LAD 45%, OM3 80%, mCFX to dCFX 100% (AV groove), lateral OM3 50%   COLONOSCOPY     CYSTOSCOPY WITH BIOPSY N/A 08/12/2020   Procedure: CYSTOSCOPY WITH BLADDER BIOPSY AND TRANSURETHRAL RESECTION OF BLADDER TUMOR;  Surgeon: Heloise Purpura, MD;  Location: [facility];  Service: Urology;  Laterality: N/A;   CYSTOSCOPY WITH BIOPSY N/A 11/10/2021   Procedure: CYSTOSCOPY WITH BLADDER BIOPSIES/ LEFT RETROGRADE;  Surgeon: Heloise Purpura, MD;  Location: [facility];  Service: Urology;  Laterality: N/A;   CYSTOSCOPY WITH BIOPSY Left 07/27/2022   Procedure: CYSTOSCOPY WITH  BLADDER BIOPSY, TRANSURETHRAL FULGERATION OF BLADDER, EXAM UNDER ANESTHESIA, LEFT RETROGRADE PYELOGRAM;  Surgeon: Heloise Purpura, MD;  Location: [facility];  Service: Urology;  Laterality: Left;   CYSTOSCOPY WITH BIOPSY N/A 04/23/2023   Procedure: CYSTOSCOPY WITH BLADDER AND PROSTATIC URETHRAL BIOPSIES;  Surgeon: Heloise Purpura, MD;  Location: [facility];  Service: Urology;  Laterality: N/A;  60 MINUTES NEEDED FOR CASE   CYSTOSCOPY WITH URETEROSCOPY AND STENT PLACEMENT Right 12/15/2019   Procedure: CYSTOSCOPY WITH RIGHT RETROGRADE/ RIGHT URETEROSCOPY/ BIOPSY;  Surgeon: Ihor Gully, MD;  Location: [facility];  Service: Urology;  Laterality: Right;   CYSTOSCOPY/RETROGRADE/URETEROSCOPY Bilateral 03/18/2018   Procedure: CYSTOSCOPY/RETROGRADE/URETEROSCOPY.;  Surgeon: Ihor Gully, MD;  Location: [facility];   Service: Urology;  Laterality: Bilateral;   EYE SURGERY Bilateral    cateract in January 2023   HYDROCELE EXCISION Left 07/27/2022   Procedure: HYDROCELE REPAIR;  Surgeon: Heloise Purpura, MD;  Location: [facility];  Service: Urology;  Laterality: Left;  GENERAL ANESTHESIA WITH PARALYSIS   KNEE ARTHROSCOPY Right    LEFT ATRIAL APPENDAGE OCCLUSION N/A 12/13/2023   Procedure: LEFT ATRIAL APPENDAGE OCCLUSION;  Surgeon: Nobie Putnam, MD;  Location: [facility] INVASIVE CV LAB;  Service: Cardiovascular;  Laterality: N/A;   LEFT HEART CATHETERIZATION WITH CORONARY ANGIOGRAM N/A 02/04/2014   Procedure: LEFT HEART CATHETERIZATION WITH CORONARY ANGIOGRAM;  Surgeon: Iran Ouch, MD;  Location: [facility] CATH LAB;  Service: Cardiovascular;  Laterality: N/A;  severe one-vessel CAD, chronically  occluded RCA with faint left-to-right collaterals;  aneurysmal LCFx with sluggish coronary flow;  normal LVSF w/ moderately elevated LVEDP (ostialOM2 20%, pOM3 20%, pD1 20%, mCFX 50%, diffuse 20% pCFX)   PROSTATE BIOPSY N/A 08/12/2020   Procedure: BIOPSY TRANSRECTAL ULTRASONIC PROSTATE (TUBP);  Surgeon: Heloise Purpura, MD;  Location: [facility];  Service: Urology;  Laterality: N/A;   ROBOT ASSITED LAPAROSCOPIC NEPHROURETERECTOMY Right 03/22/2020   Procedure: XI ROBOT ASSITED LAPAROSCOPIC NEPHROURETERECTOMY;  Surgeon: Heloise Purpura, MD;  Location: [facility];  Service: Urology;  Laterality: Right;   TRANSESOPHAGEAL ECHOCARDIOGRAM (CATH LAB) N/A 12/13/2023   Procedure: TRANSESOPHAGEAL ECHOCARDIOGRAM;  Surgeon: Nobie Putnam, MD;  Location: [facility] INVASIVE CV LAB;  Service: Cardiovascular;  Laterality: N/A;   TRANSESOPHAGEAL ECHOCARDIOGRAM (CATH LAB) N/A 02/11/2024   Procedure: TRANSESOPHAGEAL ECHOCARDIOGRAM;  Surgeon: Sande Rives, MD;  Location: [facility] INVASIVE CV LAB;  Service: Cardiovascular;  Laterality: N/A;   TRANSTHORACIC ECHOCARDIOGRAM  11-22-2016   dr hochrein   ef 55-60%/ mild MR and TR/ moderate LAE   TRANSURETHRAL RESECTION OF  BLADDER TUMOR N/A 02/10/2021   Procedure: TRANSURETHRAL RESECTION OF BLADDER TUMOR (TURBT)/ CYSTOSCOPY/ LEFT RETROGRADE;  Surgeon: Heloise Purpura, MD;  Location: [facility];  Service: Urology;  Laterality: N/A;  GENERAL ANESTHESIA WITH PARALYSIS    Allergies  Allergen Reactions   Xarelto [Rivaroxaban] Other (See Comments)    Skin Blisters     BP (!) 150/84   Ht 5\' 11"  (1.803 m)   Wt 218 lb (98.9 kg)   BMI 30.40 kg/m       No data to display              No data to display              Objective:  Physical Exam:  Gen: NAD, comfortable in exam room, ambulates with cane MSK:  KNEE:  large knee effusion, no erythema Mildly diffusely tender on palpation +1-2 edema of the bilateral lower extremities to the knees   Assessment & Plan:  1.  Right knee OA with effusion: Patient elected to undergo joint aspiration with concurrent steroid injection.  Procedure note as below.  PROCEDURE:  Risks & benefits of right knee aspiration & cortisone injection reviewed. Consent obtained. Time-out completed. Patient prepped and draped in the normal fashion. Area cleansed with chlorhexidene. Ethyl chloride spray used to anesthetize the skin. Solution of 3 mL 1% lidocaine injected into superior lateral aspect of right knee for local anesthesia.  After ensuring adequate anesthesia an 18-g 1.5-inch needle on 60-mL syringe inserted into right knee via superior-lateral approach.  Aspirated total of 100 cc of yellow synovial fluid - was not sent for analysis.  Needle left in place, syringe exchanged for syringe with solution of 4 mL 1% lidocaine with 1 mL methylprednisolone (Depo-medrol) 40mg /mL.  This was injected into the right knee without complication. Patient tolerated procedure well without any complications. Area covered with adhesive bandage. Post-procedure care reviewed.  All questions answered.

## 2024-02-14 NOTE — Addendum Note (Signed)
 Addended by: Andi Devon on: 02/14/2024 01:26 PM   Modules accepted: Level of Service

## 2024-02-15 ENCOUNTER — Encounter: Payer: Self-pay | Admitting: Cardiology

## 2024-02-15 ENCOUNTER — Ambulatory Visit: Attending: Cardiology | Admitting: Cardiology

## 2024-02-15 VITALS — BP 118/78 | HR 75 | Ht 71.0 in | Wt 222.2 lb

## 2024-02-15 DIAGNOSIS — I482 Chronic atrial fibrillation, unspecified: Secondary | ICD-10-CM | POA: Diagnosis not present

## 2024-02-15 DIAGNOSIS — I5032 Chronic diastolic (congestive) heart failure: Secondary | ICD-10-CM | POA: Diagnosis not present

## 2024-02-15 DIAGNOSIS — N184 Chronic kidney disease, stage 4 (severe): Secondary | ICD-10-CM

## 2024-02-15 DIAGNOSIS — E785 Hyperlipidemia, unspecified: Secondary | ICD-10-CM

## 2024-02-15 DIAGNOSIS — Z01818 Encounter for other preprocedural examination: Secondary | ICD-10-CM | POA: Diagnosis not present

## 2024-02-15 DIAGNOSIS — I10 Essential (primary) hypertension: Secondary | ICD-10-CM | POA: Diagnosis not present

## 2024-02-15 DIAGNOSIS — I25119 Atherosclerotic heart disease of native coronary artery with unspecified angina pectoris: Secondary | ICD-10-CM | POA: Diagnosis not present

## 2024-02-15 MED ORDER — FUROSEMIDE 20 MG PO TABS
20.0000 mg | ORAL_TABLET | Freq: Every day | ORAL | 3 refills | Status: DC
Start: 2024-02-15 — End: 2024-06-03

## 2024-02-15 NOTE — Patient Instructions (Addendum)
 Medication Instructions:  Take Lasix 20 mg (2 Tabs for the next 3 days) then take one tablet daily. New prescription sent. *If you need a refill on your cardiac medications before your next appointment, please call your pharmacy*  Lab Work: BMET end of next week.  If you have labs (blood work) drawn today and your tests are completely normal, you will receive your results only by: MyChart Message (if you have MyChart) OR A paper copy in the mail If you have any lab test that is abnormal or we need to change your treatment, we will call you to review the results.  Follow-Up: At [facility], you and your health needs are our priority.  As part of our continuing mission to provide you with exceptional heart care, our providers are all part of one team.  This team includes your primary Cardiologist (physician) and Advanced Practice Providers or APPs (Physician Assistants and Nurse Practitioners) who all work together to provide you with the care you need, when you need it.  Your next appointment:   4 month(s)  Provider:   Reather Littler, NP    We recommend signing up for the patient portal called "MyChart".  Sign up information is provided on this After Visit Summary.  MyChart is used to connect with patients for Virtual Visits (Telemedicine).  Patients are able to view lab/test results, encounter notes, upcoming appointments, etc.  Non-urgent messages can be sent to your provider as well.   To learn more about what you can do with MyChart, go to [email or web address].   Other Instructions  Salty Six hand out given. Fluid restriction of 48 ounces daily.  One glass of water is approximately 8 oz. Please measure and track your fluid intake to avoid going over the 48 ounces per day.       1st Floor: - Lobby - Registration  - Pharmacy  - Lab - Cafe  2nd Floor: - PV Lab - Diagnostic Testing (echo, CT, nuclear med)  3rd Floor: - Vacant  4th Floor: - TCTS (cardiothoracic  surgery) - AFib Clinic - Structural Heart Clinic - Vascular Surgery  - Vascular Ultrasound  5th Floor: - HeartCare Cardiology (general and EP) - Clinical Pharmacy for coumadin, hypertension, lipid, weight-loss medications, and med management appointments    Valet parking services will be available as well.

## 2024-02-18 ENCOUNTER — Encounter: Payer: Self-pay | Admitting: Cardiology

## 2024-02-18 NOTE — Telephone Encounter (Signed)
error 

## 2024-02-20 NOTE — Patient Instructions (Signed)
 SURGICAL WAITING ROOM VISITATION  Patients having surgery or a procedure may have no more than 2 support people in the waiting area - these visitors may rotate.    Children under the age of 51 must have an adult with them who is not the patient.  Due to an increase in RSV and influenza rates and associated hospitalizations, children ages 45 and under may not visit patients in [facility] hospitals.  Visitors with respiratory illnesses are discouraged from visiting and should remain at home.  If the patient needs to stay at the hospital during part of their recovery, the visitor guidelines for inpatient rooms apply. Pre-op nurse will coordinate an appropriate time for 1 support person to accompany patient in pre-op.  This support person may not rotate.    Please refer to the [facility] website for the visitor guidelines for Inpatients (after your surgery is over and you are in a regular room).    Your procedure is scheduled on: 03/06/24   Report to [facility] Main Entrance    Report to admitting at 11:05 AM   Call this number if you have problems the morning of surgery [phone]   Do not eat food or drink liquids :After Midnight.          If you have questions, please contact your surgeon's office.   FOLLOW BOWEL PREP AND ANY ADDITIONAL PRE OP INSTRUCTIONS YOU RECEIVED FROM YOUR SURGEON'S OFFICE!!!     Oral Hygiene is also important to reduce your risk of infection.                                    Remember - BRUSH YOUR TEETH THE MORNING OF SURGERY WITH YOUR REGULAR TOOTHPASTE  DENTURES WILL BE REMOVED PRIOR TO SURGERY PLEASE DO NOT APPLY "Poly grip" OR ADHESIVES!!!   Stop all vitamins and herbal supplements 7 days before surgery.   Take these medicines the morning of surgery with A SIP OF WATER: Amlodipine, Isosorbide, Metoprolol, Oxybutynin, Rosuvastatin, Tamsulosin   These are anesthesia recommendations for holding your anticoagulants.  Please contact your  prescribing physician to confirm IF it is safe to hold your anticoagulants for this length of time. Last Dose 02/29/24   Eliquis Apixaban   72 hours   Xarelto Rivaroxaban   72 hours  Plavix Clopidogrel   120 hours  Pletal Cilostazol   120 hours    DO NOT TAKE ANY ORAL DIABETIC MEDICATIONS DAY OF YOUR SURGERY  How to Manage Your Diabetes Before and After Surgery  Why is it important to control my blood sugar before and after surgery? Improving blood sugar levels before and after surgery helps healing and can limit problems. A way of improving blood sugar control is eating a healthy diet by:  Eating less sugar and carbohydrates  Increasing activity/exercise  Talking with your doctor about reaching your blood sugar goals High blood sugars (greater than 180 mg/dL) can raise your risk of infections and slow your recovery, so you will need to focus on controlling your diabetes during the weeks before surgery. Make sure that the doctor who takes care of your diabetes knows about your planned surgery including the date and location.  How do I manage my blood sugar before surgery? Check your blood sugar at least 4 times a day, starting 2 days before surgery, to make sure that the level is not too high or low. Check your  blood sugar the morning of your surgery when you wake up and every 2 hours until you get to the Short Stay unit. If your blood sugar is less than 70 mg/dL, you will need to treat for low blood sugar: Do not take insulin. Treat a low blood sugar (less than 70 mg/dL) with  cup of clear juice (cranberry or apple), 4 glucose tablets, OR glucose gel. Recheck blood sugar in 15 minutes after treatment (to make sure it is greater than 70 mg/dL). If your blood sugar is not greater than 70 mg/dL on recheck, call [phone] for further instructions. Report your blood sugar to the short stay nurse when you get to Short Stay.  If you are admitted to the hospital after surgery: Your blood  sugar will be checked by the staff and you will probably be given insulin after surgery (instead of oral diabetes medicines) to make sure you have good blood sugar levels. The goal for blood sugar control after surgery is 80-180 mg/dL.   WHAT DO I DO ABOUT MY DIABETES MEDICATION?  Do not take oral diabetes medicines (pills) the morning of surgery.  THE DAY BEFORE SURGERY, take Januvia as prescribed.     THE MORNING OF SURGERY, do not take Januvia.  Reviewed and Endorsed by [facility] Patient Education Committee, August 2015                              You may not have any metal on your body including jewelry, and body piercing             Do not wear lotions, powders, cologne, or deodorant              Men may shave face and neck.   Do not bring valuables to the hospital. [facility] IS NOT             RESPONSIBLE   FOR VALUABLES.   Contacts, glasses, dentures or bridgework may not be worn into surgery.  DO NOT BRING YOUR HOME MEDICATIONS TO THE HOSPITAL. PHARMACY WILL DISPENSE MEDICATIONS LISTED ON YOUR MEDICATION LIST TO YOU DURING YOUR ADMISSION IN THE HOSPITAL!    Patients discharged on the day of surgery will not be allowed to drive home.  Someone NEEDS to stay with you for the first 24 hours after anesthesia.              Please read over the following fact sheets you were given: IF YOU HAVE QUESTIONS ABOUT YOUR PRE-OP INSTRUCTIONS PLEASE CALL [phone] Contras    If you received a COVID test during your pre-op visit  it is requested that you wear a mask when out in public, stay away from anyone that may not be feeling well and notify your surgeon if you develop symptoms. If you test positive for Covid or have been in contact with anyone that has tested positive in the last 10 days please notify you surgeon.    [place] - Preparing for Surgery Before surgery, you can play an important role.  Because skin is not sterile, your skin needs to be as free of germs as  possible.  You can reduce the number of germs on your skin by washing with CHG (chlorahexidine gluconate) soap before surgery.  CHG is an antiseptic cleaner which kills germs and bonds with the skin to continue killing germs even after washing. Please DO NOT use if you have an allergy  to CHG or antibacterial soaps.  If your skin becomes reddened/irritated stop using the CHG and inform your nurse when you arrive at Short Stay. Do not shave (including legs and underarms) for at least 48 hours prior to the first CHG shower.  You may shave your face/neck.  Please follow these instructions carefully:  1.  Shower with CHG Soap the night before surgery and the  morning of surgery.  2.  If you choose to wash your hair, wash your hair first as usual with your normal  shampoo.  3.  After you shampoo, rinse your hair and body thoroughly to remove the shampoo.                             4.  Use CHG as you would any other liquid soap.  You can apply chg directly to the skin and wash.  Gently with a scrungie or clean washcloth.  5.  Apply the CHG Soap to your body ONLY FROM THE NECK DOWN.   Do   not use on face/ open                           Wound or open sores. Avoid contact with eyes, ears mouth and   genitals (private parts).                       Wash face,  Genitals (private parts) with your normal soap.             6.  Wash thoroughly, paying special attention to the area where your    surgery  will be performed.  7.  Thoroughly rinse your body with warm water from the neck down.  8.  DO NOT shower/wash with your normal soap after using and rinsing off the CHG Soap.                9.  Pat yourself dry with a clean towel.            10.  Wear clean pajamas.            11.  Place clean sheets on your bed the night of your first shower and do not  sleep with pets. Day of Surgery : Do not apply any lotions/deodorants the morning of surgery.  Please wear clean clothes to the hospital/surgery  center.  FAILURE TO FOLLOW THESE INSTRUCTIONS MAY RESULT IN THE CANCELLATION OF YOUR SURGERY  PATIENT SIGNATURE_________________________________  NURSE SIGNATURE__________________________________  ________________________________________________________________________

## 2024-02-20 NOTE — Progress Notes (Addendum)
 Patient forgot about PST appointment today. Completed interview portion over the phone and he stated he was coming to cancer center for labs at 1230. Spoke with cancer center and they stated they could draw his labs there. After patient came over and got his instructions and soap.   Redraw A1C DOS, did not result.  COVID Vaccine Completed:  Date of COVID positive in last 90 days:  PCP - Remo Carls, MD Cardiologist - Eilleen Grates, MD LOV 02/15/24 Electrophysiologist- Ardeen Kohler, MD Nephrologist- Shaune Delaine, MD  Cardiac clearance by Dr. Lavonne Prairie 02/15/24 in Epic  Chest x-ray - 11/29/23 Epic EKG - 02/15/24 Epic Stress Test - been a few years per pt ECHO - 02/11/24 Epic Cardiac Cath - 11/21/16 Epic Pacemaker/ICD device last checked: n/a Spinal Cord Stimulator: n/a  Bowel Prep - no  Sleep Study - n/a CPAP -   Fasting Blood Sugar - 100s Checks Blood Sugar not consistent  Last dose of GLP1 agonist-  N/A GLP1 instructions:  Hold 7 days before surgery    Last dose of SGLT-2 inhibitors-  N/A SGLT-2 instructions:  Hold 3 days before surgery    Blood Thinner Instructions:  Plavix , hold 5 days. Start ASA 81 perioperatively until has resumed Plavix Aspirin Instructions: Last Dose: 02/29/24  Activity level: Can go up a flight of stairs and perform activities of daily living without stopping and without symptoms of chest pain or shortness of breath.  Anesthesia review: HTN, CAD, DM2, CHF, a fib, CKD, NSTEMI, watchman,creatinine 1.88  Patient denies shortness of breath, fever, cough and chest pain at PAT appointment  Patient verbalized understanding of instructions that were given to them at the PAT appointment. Patient was also instructed that they will need to review over the PAT instructions again at home before surgery.

## 2024-02-21 ENCOUNTER — Inpatient Hospital Stay: Payer: Medicare PPO

## 2024-02-21 ENCOUNTER — Other Ambulatory Visit: Payer: Self-pay

## 2024-02-21 ENCOUNTER — Encounter (HOSPITAL_COMMUNITY): Payer: Self-pay

## 2024-02-21 ENCOUNTER — Encounter (HOSPITAL_COMMUNITY)
Admission: RE | Admit: 2024-02-21 | Discharge: 2024-02-21 | Disposition: A | Discharge status: Home / Self Care | Source: Ambulatory Visit | Attending: Urology | Admitting: Urology

## 2024-02-21 ENCOUNTER — Inpatient Hospital Stay: Payer: Medicare PPO | Attending: Hematology

## 2024-02-21 ENCOUNTER — Inpatient Hospital Stay: Payer: Medicare PPO | Admitting: Hematology

## 2024-02-21 VITALS — BP 138/70 | HR 75 | Temp 97.9°F | Resp 20 | Ht 71.0 in | Wt 209.5 lb

## 2024-02-21 VITALS — BP 143/97 | HR 83 | Temp 97.6°F | Resp 18

## 2024-02-21 VITALS — Ht 71.0 in | Wt 218.0 lb

## 2024-02-21 DIAGNOSIS — I4819 Other persistent atrial fibrillation: Secondary | ICD-10-CM | POA: Diagnosis not present

## 2024-02-21 DIAGNOSIS — I509 Heart failure, unspecified: Secondary | ICD-10-CM | POA: Diagnosis not present

## 2024-02-21 DIAGNOSIS — Z7982 Long term (current) use of aspirin: Secondary | ICD-10-CM | POA: Diagnosis not present

## 2024-02-21 DIAGNOSIS — Z85528 Personal history of other malignant neoplasm of kidney: Secondary | ICD-10-CM | POA: Diagnosis not present

## 2024-02-21 DIAGNOSIS — Z01812 Encounter for preprocedural laboratory examination: Secondary | ICD-10-CM | POA: Diagnosis not present

## 2024-02-21 DIAGNOSIS — I340 Nonrheumatic mitral (valve) insufficiency: Secondary | ICD-10-CM | POA: Insufficient documentation

## 2024-02-21 DIAGNOSIS — Z7984 Long term (current) use of oral hypoglycemic drugs: Secondary | ICD-10-CM | POA: Insufficient documentation

## 2024-02-21 DIAGNOSIS — Z79899 Other long term (current) drug therapy: Secondary | ICD-10-CM | POA: Insufficient documentation

## 2024-02-21 DIAGNOSIS — C679 Malignant neoplasm of bladder, unspecified: Secondary | ICD-10-CM | POA: Insufficient documentation

## 2024-02-21 DIAGNOSIS — Z87891 Personal history of nicotine dependence: Secondary | ICD-10-CM | POA: Insufficient documentation

## 2024-02-21 DIAGNOSIS — N183 Chronic kidney disease, stage 3 unspecified: Secondary | ICD-10-CM | POA: Insufficient documentation

## 2024-02-21 DIAGNOSIS — E1159 Type 2 diabetes mellitus with other circulatory complications: Secondary | ICD-10-CM

## 2024-02-21 DIAGNOSIS — I2510 Atherosclerotic heart disease of native coronary artery without angina pectoris: Secondary | ICD-10-CM | POA: Diagnosis not present

## 2024-02-21 DIAGNOSIS — I4891 Unspecified atrial fibrillation: Secondary | ICD-10-CM | POA: Insufficient documentation

## 2024-02-21 DIAGNOSIS — E1122 Type 2 diabetes mellitus with diabetic chronic kidney disease: Secondary | ICD-10-CM | POA: Diagnosis not present

## 2024-02-21 DIAGNOSIS — I700 Atherosclerosis of aorta: Secondary | ICD-10-CM | POA: Diagnosis not present

## 2024-02-21 DIAGNOSIS — Z5112 Encounter for antineoplastic immunotherapy: Secondary | ICD-10-CM | POA: Diagnosis not present

## 2024-02-21 DIAGNOSIS — Z7901 Long term (current) use of anticoagulants: Secondary | ICD-10-CM | POA: Diagnosis not present

## 2024-02-21 DIAGNOSIS — C689 Malignant neoplasm of urinary organ, unspecified: Secondary | ICD-10-CM | POA: Diagnosis not present

## 2024-02-21 DIAGNOSIS — I110 Hypertensive heart disease with heart failure: Secondary | ICD-10-CM | POA: Insufficient documentation

## 2024-02-21 HISTORY — DX: Chronic atrial fibrillation, unspecified: I48.20

## 2024-02-21 HISTORY — DX: Heart failure, unspecified: I50.9

## 2024-02-21 LAB — CMP (CANCER CENTER ONLY)
ALT: 11 U/L (ref 0–44)
AST: 12 U/L — ABNORMAL LOW (ref 15–41)
Albumin: 3.6 g/dL (ref 3.5–5.0)
Alkaline Phosphatase: 125 U/L (ref 38–126)
Anion gap: 3 — ABNORMAL LOW (ref 5–15)
BUN: 42 mg/dL — ABNORMAL HIGH (ref 8–23)
CO2: 26 mmol/L (ref 22–32)
Calcium: 9 mg/dL (ref 8.9–10.3)
Chloride: 107 mmol/L (ref 98–111)
Creatinine: 1.88 mg/dL — ABNORMAL HIGH (ref 0.61–1.24)
GFR, Estimated: 36 mL/min — ABNORMAL LOW (ref >60)
Glucose, Bld: 206 mg/dL — ABNORMAL HIGH (ref 70–99)
Potassium: 4.4 mmol/L (ref 3.5–5.1)
Sodium: 136 mmol/L (ref 135–145)
Total Bilirubin: 2 mg/dL — ABNORMAL HIGH (ref 0.0–1.2)
Total Protein: 7.5 g/dL (ref 6.5–8.1)

## 2024-02-21 LAB — CBC WITH DIFFERENTIAL (CANCER CENTER ONLY)
Abs Immature Granulocytes: 0.06 10*3/uL (ref 0.00–0.07)
Basophils Absolute: 0.1 10*3/uL (ref 0.0–0.1)
Basophils Relative: 1 %
Eosinophils Absolute: 0.2 10*3/uL (ref 0.0–0.5)
Eosinophils Relative: 1 %
HCT: 40.9 % (ref 39.0–52.0)
Hemoglobin: 13.5 g/dL (ref 13.0–17.0)
Immature Granulocytes: 0 %
Lymphocytes Relative: 8 %
Lymphs Abs: 1.1 10*3/uL (ref 0.7–4.0)
MCH: 26.9 pg (ref 26.0–34.0)
MCHC: 33 g/dL (ref 30.0–36.0)
MCV: 81.5 fL (ref 80.0–100.0)
Monocytes Absolute: 1.8 10*3/uL — ABNORMAL HIGH (ref 0.1–1.0)
Monocytes Relative: 13 %
Neutro Abs: 10.7 10*3/uL — ABNORMAL HIGH (ref 1.7–7.7)
Neutrophils Relative %: 77 %
Platelet Count: 220 10*3/uL (ref 150–400)
RBC: 5.02 MIL/uL (ref 4.22–5.81)
RDW: 17 % — ABNORMAL HIGH (ref 11.5–15.5)
WBC Count: 13.9 10*3/uL — ABNORMAL HIGH (ref 4.0–10.5)
nRBC: 0 % (ref 0.0–0.2)

## 2024-02-21 MED ORDER — SODIUM CHLORIDE 0.9 % IV SOLN: Freq: Once | INTRAVENOUS | Status: AC

## 2024-02-21 MED ORDER — SODIUM CHLORIDE 0.9 % IV SOLN
200.0000 mg | Freq: Once | INTRAVENOUS | Status: AC
  Administered 2024-02-21: 200 mg via INTRAVENOUS
  Filled 2024-02-21: qty 8

## 2024-02-21 NOTE — Patient Instructions (Signed)

## 2024-02-21 NOTE — Progress Notes (Signed)
 [facility]   Telephone:([phone]) [phone] Fax:(336) [phone]   Clinic Follow up Note   Patient Care Team: Loyola Mast, MD as PCP - General (Family Medicine) Rollene Rotunda, MD as PCP - Cardiology (Cardiology) Nobie Putnam, MD as PCP - Electrophysiology (Cardiology) Heloise Purpura, MD as Consulting Physician (Urology) Rollene Rotunda, MD as Consulting Physician (Cardiology) Estanislado Emms, MD as Consulting Physician (Nephrology) Malachy Mood, MD as Consulting Physician (Hematology and Oncology) [facility]  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  He has had multiple BCG maintenance therapy, is BCG refractory. -Given his BCG refractory disease, recurrence in bladder and the urethra, I recommend systemic therapy with Keytruda every 3 weeks (or every 6 weeks if he tolerates well) for 2 years, per NCCN guideline.  He had no history of autoimmune disease, overall in good health, there is no contraindication for immunotherapy. --he started on 06/15/2023. C4 was postponed due to dyspnea and hypoxia on exertion, CT chest was negative for pneumonitis  -repeated cystoscopy and cytology was still positive in early Nov 2024. I discussed with Dr. Laverle Patter and we decide continue Keytruda for additional 3 months and re-evaluate   Assessment & Plan Superficial bladder cancer He is undergoing follow-up for superficial bladder cancer with persistent nocturnal burning sensation  and pain, alleviated by movement. A cystoscopy is scheduled for April 24th to evaluate cancer status. He is receiving Keytruda, an immunotherapy, to reduce recurrence by targeting cancer cells via the immune system. If the cancer remains superficial and non-muscle invasive, prognosis is favorable, though recurrence is common. Persistent cancer may necessitate cystectomy, raising concerns about postoperative urinary diversion. Alternative treatments include intravesical chemotherapy, which may cause spasms and leakage, as a bladder-sparing option. Keytruda reduces recurrence by approximately 40%, with variable individual efficacy. Superficial bladder cancer is not typically fatal but can cause symptoms and potentially progress to muscle invasion if untreated. - Administer Keytruda infusion today. - Perform cystoscopy on April 24th. - Discuss cystoscopy results with Dr. Mariann Laster post-procedure. - Consider surgical options if cancer persists. - Evaluate potential for intravesical chemotherapy if bladder removal is not desired.  Kidney cancer He had kidney cancer, reportedly completely resected. There is concern about potential metastasis to the bladder, but current bladder cancer is superficial.  Atrial fibrillation He has atrial fibrillation, managed with a Watchman device and Plavix. Aspirin was discontinued due to hematuria. He is under cardiology care.  Edema He experienced significant fluid loss from his legs, resulting in weight reduction. He is on furosemide, prescribed by his cardiologist, and uses compression stockings to manage the edema.  Plan -Lab reviewed, will proceed with Rande Lawman today -He is scheduled for cystoscopy on March 06, 2024 He is advised to follow up after the cystoscopy to discuss results and further management plans.      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 Mild degradation secondary to lack of IV contrast and motion. 2. Similar to decrease in mild right-sided bladder wall thickening, nonspecific in the setting of prior bladder cancer. Most likely treatment related. 3. Borderline left external iliac and right inguinal adenopathy, decreased. This could represent response to therapy of metastasis or be reactive. 4. No evidence of abdominal metastasis or recurrent disease, status post right nephrectomy.   03/22/2023 Imaging   MR abdomen without contrast  IMPRESSION: 1. Mild degradation secondary to lack of IV contrast and motion. 2. Similar to decrease in mild right-sided bladder wall thickening, nonspecific in the setting of prior bladder cancer. Most likely treatment related. 3. Borderline left external iliac and right inguinal adenopathy, decreased. This could represent response to therapy of metastasis or be reactive. 4. No evidence of abdominal metastasis or recurrent disease, status post right nephrectomy.     06/08/2023 Cancer Staging   Staging form: Urinary Bladder, AJCC 8th Edition - Clinical stage from 06/08/2023: Stage 0is (cTis, cN0, cM0) - Signed by Malachy Mood, MD on 06/09/2023 WHO/ISUP grade (low/high): High Grade Histologic grading system: 2 grade system   07/31/2023 Imaging   CT chest without contrast   IMPRESSION: No findings suspicious for pneumonitis.  No evidence of metastatic disease.  Aortic Atherosclerosis (ICD10-I70.0).   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  History of Present Illness The patient, a 78 year old male with a history of superficial bladder cancer and kidney cancer, presents for a follow-up visit. The patient reports persistent burning sensation and pain, particularly at night. The pain is not relieved by movement and has been causing difficulty sleeping. The patient denies any changes in symptoms since the last visit.  The patient also has a history of atrial fibrillation, for which he is on blood thinners. He reports no issues with breathing and has been told by his heart doctor that his heart and lungs are strong. The patient also mentions a significant weight loss due to fluid loss from his legs, which is being managed with furosemide prescribed by his heart doctor.  The patient is scheduled for a cystoscopy in two weeks to assess the status of his bladder cancer. He expresses concern about the possibility of needing to have his bladder removed if the cancer is found to have recurred. The patient also questions the effectiveness of his current treatment with Keytruda, and whether there are other treatment options available.     All other systems were reviewed with the patient and are negative.  MEDICAL HISTORY:  Past Medical History:  Diagnosis Date   Abnormal radiologic findings on diagnostic imaging of renal pelvis, ureter, or bladder    bilateral ureter abnormalities   Anticoagulant long-term use    eliquis   Anxiety    pt denies  Arthritis    Atrial fibrillation, chronic (HCC)    CAD (coronary artery disease) cardiologist-- dr hochrein   NSTEMI 02-04-2014  per cardiac cath chronic occluded RCA w/ faint left-to-right collaterals and aneurysmal  LCFx with sluggish coronary flow/   NSTEMI --11-21-2016 per cardiac cath occluded proximal RCA & mid to diastal CFX 100%, med rx. If that does not work, PTCA or CABG   Cancer [facility])    bladder and kidney   CHF (congestive heart failure) (HCC)    CKD (chronic kidney disease), stage III (HCC)    patient unaware   DOE (dyspnea on exertion)    Fatty liver    pt denies   Hematuria 02/2019   History of COVID-19 10/2019   History of non-ST elevation myocardial infarction (NSTEMI)    02-04-2014  and 11-21-2016  cardiac cath done both times ,  medically management   History of shingles 12/2017   slight pain and numbness still noted in the area   Hyperlipidemia    Hypertension    Insomnia    Myocardial infarction [facility]) 2015   Persistent atrial fibrillation [facility])    cardiologsit-- dr hochrein   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 Jimmey Ralph   Thoracic aortic atherosclerosis (HCC)    Type 2 diabetes mellitus (HCC)    Urinary frequency     SURGICAL HISTORY: Past Surgical History:  Procedure Laterality Date   CARDIAC CATHETERIZATION N/A 11/21/2016   Procedure: Left Heart Cath and Coronary Angiography;  Surgeon: Lennette Bihari, MD;  Location: [facility] INVASIVE CV LAB;  Service: Cardiovascular;  Laterality: N/A;  pRCA 100% , ostial LAD 45%, OM3 80%, mCFX to dCFX 100% (AV groove), lateral OM3 50%   COLONOSCOPY     CYSTOSCOPY WITH BIOPSY N/A 08/12/2020   Procedure: CYSTOSCOPY WITH BLADDER BIOPSY AND TRANSURETHRAL RESECTION OF BLADDER TUMOR;  Surgeon: Heloise Purpura, MD;  Location: [facility] ORS;  Service: Urology;  Laterality: N/A;   CYSTOSCOPY WITH BIOPSY N/A 11/10/2021   Procedure: CYSTOSCOPY WITH BLADDER BIOPSIES/ LEFT RETROGRADE;  Surgeon: Heloise Purpura, MD;  Location: [facility] ORS;  Service: Urology;  Laterality: N/A;   CYSTOSCOPY WITH BIOPSY Left 07/27/2022   Procedure: CYSTOSCOPY WITH  BLADDER BIOPSY, TRANSURETHRAL FULGERATION OF BLADDER,  EXAM UNDER ANESTHESIA, LEFT RETROGRADE PYELOGRAM;  Surgeon: Heloise Purpura, MD;  Location: [facility] ORS;  Service: Urology;  Laterality: Left;   CYSTOSCOPY WITH BIOPSY N/A 04/23/2023   Procedure: CYSTOSCOPY WITH BLADDER AND PROSTATIC URETHRAL BIOPSIES;  Surgeon: Heloise Purpura, MD;  Location: [facility];  Service: Urology;  Laterality: N/A;  60 MINUTES NEEDED FOR CASE   CYSTOSCOPY WITH URETEROSCOPY AND STENT PLACEMENT Right 12/15/2019   Procedure: CYSTOSCOPY WITH RIGHT RETROGRADE/ RIGHT URETEROSCOPY/ BIOPSY;  Surgeon: Ihor Gully, MD;  Location: [facility];  Service: Urology;  Laterality: Right;   CYSTOSCOPY/RETROGRADE/URETEROSCOPY Bilateral 03/18/2018   Procedure: CYSTOSCOPY/RETROGRADE/URETEROSCOPY.;  Surgeon: Ihor Gully, MD;  Location: [facility];  Service: Urology;  Laterality: Bilateral;   EYE SURGERY Bilateral    cateract in January 2023   HYDROCELE EXCISION Left 07/27/2022   Procedure: HYDROCELE REPAIR;  Surgeon: Heloise Purpura, MD;  Location: [facility];  Service: Urology;  Laterality: Left;  GENERAL ANESTHESIA WITH PARALYSIS   KNEE ARTHROSCOPY Right    LEFT ATRIAL APPENDAGE OCCLUSION N/A 12/13/2023   Procedure: LEFT ATRIAL APPENDAGE OCCLUSION;  Surgeon: Nobie Putnam, MD;  [facility] INVAS[facility] CV LAB;  Service: Cardiovascular;  Laterality: N/A;   LEFT HEART  CATHETERIZATION WITH CORONARY ANGIOGRAM N/A 02/04/2014   Procedure: LEFT HEART CATHETERIZATION WITH CORONARY ANGIOGRAM;  Surgeon: Iran Ouch, MD;  Location: [facility] CATH LAB;  Service: Cardiovascular;  Laterality: N/A;  severe one-vessel CAD, chronically occluded RCA with faint left-to-right collaterals;  aneurysmal LCFx with sluggish coronary flow;  normal LVSF w/ moderately elevated LVEDP (ostialOM2 20%, pOM3 20%, pD1 20%, mCFX 50%, diffuse 20% pCFX)   PROSTATE BIOPSY N/A 08/12/2020   Procedure: BIOPSY TRANSRECTAL ULTRASONIC PROSTATE (TUBP);  Surgeon: Heloise Purpura, MD;  Location: [facility];  Service: Urology;   Laterality: N/A;   ROBOT ASSITED LAPAROSCOPIC NEPHROURETERECTOMY Right 03/22/2020   Procedure: XI ROBOT ASSITED LAPAROSCOPIC NEPHROURETERECTOMY;  Surgeon: Heloise Purpura, MD;  Location: [facility];  Service: Urology;  Laterality: Right;   TRANSESOPHAGEAL ECHOCARDIOGRAM (CATH LAB) N/A 12/13/2023   Procedure: TRANSESOPHAGEAL ECHOCARDIOGRAM;  Surgeon: Nobie Putnam, MD;  Location[facility] INVASIVE CV LAB;  Service: Cardiovascular;  Laterality: N/A;   TRANSESOPHAGEAL ECHOCARDIOGRAM (CATH LAB) N/A 02/11/2024   Procedure: TRANSESOPHAGEAL ECHOCARDIOGRAM;  Surgeon: Sande Rives, MD;  Location: [facility] INVASIVE CV LAB;  Service: Cardiovascular;  Laterality: N/A;   TRANSTHORACIC ECHOCARDIOGRAM  11-22-2016   dr hochrein   ef 55-60%/ mild MR and TR/ moderate LAE   TRANSURETHRAL RESECTION OF BLADDER TUMOR N/A 02/10/2021   Procedure: TRANSURETHRAL RESECTION OF BLADDER TUMOR (TURBT)/ CYSTOSCOPY/ LEFT RETROGRADE;  Surgeon: Heloise Purpura, MD;  Location: [facility];  Service: Urology;  Laterality: N/A;  GENERAL ANESTHESIA WITH PARALYSIS    I have reviewed the social history and family history with the patient and they are unchanged from previous note.  ALLERGIES:  is allergic to xarelto [rivaroxaban].  MEDICATIONS:  Current Outpatient Medications  Medication Sig Dispense Refill   amLODipine (NORVASC) 5 MG tablet TAKE 1 TABLET (5 MG TOTAL) BY MOUTH DAILY 90 tablet 3   Cholecalciferol (VITAMIN D3) 1000 units CAPS Take 1,000 Units by mouth daily.     clopidogrel (PLAVIX) 75 MG tablet Take 1 tablet (75 mg total) by mouth daily. 90 tablet 3   furosemide (LASIX) 20 MG tablet Take 1 tablet (20 mg total) by mouth daily. For the next 3 days take 40 mg (2 tabs) daily then resume one tab daily. 96 tablet 3   hydrOXYzine (VISTARIL) 25 MG capsule Take 1 capsule (25 mg total) by mouth every 8 (eight) hours as needed. 30 capsule 2   isosorbide mononitrate (IMDUR) 30 MG 24 hr tablet TAKE ONE TABLET BY MOUTH DAILY 90 tablet 3    JANUVIA 25 MG tablet TAKE ONE TABLET BY MOUTH DAILY 60 tablet 0   metoprolol tartrate (LOPRESSOR) 25 MG tablet TAKE ONE TABLET BY MOUTH TWICE A DAY 180 tablet 2   nitrofurantoin, macrocrystal-monohydrate, (MACROBID) 100 MG capsule Take 1 capsule (100 mg total) by mouth 2 (two) times daily. 20 capsule 0   nitroGLYCERIN (NITROSTAT) 0.4 MG SL tablet TAKE ONE TABLET UNDER THE TONGUE EVERY FIVE MINUTES FOR THREE DOSES AS NEEDED FOR CHEST PAIN, CALL 911 IF 2ND DOSE DOESN'T HELP 25 tablet 4   olmesartan (BENICAR) 40 MG tablet Take 40 mg by mouth daily.     oxybutynin (DITROPAN XL) 15 MG 24 hr tablet Take 1 tablet (15 mg total) by mouth 2 (two) times daily as needed. 60 tablet 1   Pembrolizumab (KEYTRUDA IV) Inject into the vein every 14 (fourteen) days.     phenazopyridine (PYRIDIUM) 200 MG tablet Take 1 tablet (200 mg total) by mouth 3 (three) times daily as needed for pain. 30 tablet 1  rosuvastatin (CRESTOR) 40 MG tablet TAKE 1 TABLET (40 MG TOTAL) BY MOUTH EVERY MORNING. PLEASE KEEP SCHEDULED APPOINTMENT 90 tablet 3   sodium bicarbonate 650 MG tablet Take 650 mg by mouth 2 (two) times daily.     tamsulosin (FLOMAX) 0.4 MG CAPS capsule Take 0.4 mg by mouth daily.     traZODone (DESYREL) 50 MG tablet Take 0.5-1 tablets (25-50 mg total) by mouth at bedtime as needed for sleep. 30 tablet 3   No current facility-administered medications for this visit.    PHYSICAL EXAMINATION: ECOG PERFORMANCE STATUS: 1 - Symptomatic but completely ambulatory  Vitals:   02/21/24 1324  BP: 138/70  Pulse: 75  Resp: 20  Temp: 97.9 F (36.6 C)  SpO2: 99%   Wt Readings from Last 3 Encounters:  02/21/24 209 lb 8 oz (95 kg)  02/21/24 218 lb (98.9 kg)  02/15/24 222 lb 3.2 oz (100.8 kg)     GENERAL:alert, no distress and comfortable SKIN: skin color, texture, turgor are normal, no rashes or significant lesions EYES: normal, Conjunctiva are pink and non-injected, sclera clear NECK: supple, thyroid normal size,  non-tender, without nodularity LYMPH:  no palpable lymphadenopathy in the cervical, axillary  LUNGS: clear to auscultation and percussion with normal breathing effort HEART: regular rate & rhythm and no murmurs and no lower extremity edema ABDOMEN:abdomen soft, non-tender and normal bowel sounds Musculoskeletal:no cyanosis of digits and no clubbing  NEURO: alert & oriented x 3 with fluent speech, no focal motor/sensory deficits  Physical Exam    LABORATORY DATA:  I have reviewed the data as listed    Latest Ref Rng & Units 02/21/2024   12:55 PM 01/31/2024   12:37 PM 01/10/2024    1:15 PM  CBC  WBC 4.0 - 10.5 K/uL 13.9  10.5  11.6   Hemoglobin 13.0 - 17.0 g/dL 40.9  81.1  91.4   Hematocrit 39.0 - 52.0 % 40.9  41.1  41.5   Platelets 150 - 400 K/uL 220  185  190         Latest Ref Rng & Units 02/21/2024   12:55 PM 01/31/2024   12:37 PM 01/10/2024    1:15 PM  CMP  Glucose 70 - 99 mg/dL 782  956  213   BUN 8 - 23 mg/dL 42  35  39   Creatinine 0.61 - 1.24 mg/dL 0.86  5.78  4.69   Sodium 135 - 145 mmol/L 136  137  139   Potassium 3.5 - 5.1 mmol/L 4.4  4.2  4.3   Chloride 98 - 111 mmol/L 107  108  110   CO2 22 - 32 mmol/L 26  23  23    Calcium 8.9 - 10.3 mg/dL 9.0  9.3  9.4   Total Protein 6.5 - 8.1 g/dL 7.5  7.6  7.6   Total Bilirubin 0.0 - 1.2 mg/dL 2.0  2.7  2.5   Alkaline Phos 38 - 126 U/L 125  105  112   AST 15 - 41 U/L 12  17  17    ALT 0 - 44 U/L 11  11  11        RADIOGRAPHIC STUDIES: I have personally reviewed the radiological images as listed and agreed with the findings in the report. No results found.    Orders Placed This Encounter  Procedures   CBC with Differential (Cancer Center Only)    Standing Status:   Future    Expected Date:   03/13/2024    Expiration  Date:   03/13/2025   CMP (Cancer Center only)    Standing Status:   Future    Expected Date:   03/13/2024    Expiration Date:   03/13/2025   T4    Standing Status:   Future    Expected Date:   03/13/2024     Expiration Date:   03/13/2025   TSH    Standing Status:   Future    Expected Date:   03/13/2024    Expiration Date:   03/13/2025   All questions were answered. The patient knows to call the clinic with any problems, questions or concerns. No barriers to learning was detected. The total time spent in the appointment was 25 minutes.     Malachy Mood, MD 02/21/2024

## 2024-02-21 NOTE — Assessment & Plan Note (Signed)
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  He has had multiple BCG maintenance therapy, is BCG refractory. -Given his BCG refractory disease, recurrence in bladder and the urethra, I recommend systemic therapy with Keytruda every 3 weeks (or every 6 weeks if he tolerates well) for 2 years, per NCCN guideline.  He had no history of autoimmune disease, overall in good health, there is no contraindication for immunotherapy. --he started on 06/15/2023. C4 was postponed due to dyspnea and hypoxia on exertion, CT chest was negative for pneumonitis  -repeated cystoscopy and cytology was still positive in early Nov 2024. I discussed with Dr. Laverle Patter and we decide continue Keytruda for additional 3 months and re-evaluate

## 2024-02-22 ENCOUNTER — Other Ambulatory Visit: Payer: Self-pay | Admitting: Family Medicine

## 2024-02-27 NOTE — Progress Notes (Signed)
 Anesthesia Chart Review   Case: [id number] Date/Time: 03/06/24 1304   Procedure: CYSTOSCOPY, WITH BIOPSY - CYSTOSCOPY WITH BLADDER BIOPSIES   Anesthesia type: General   Diagnosis: Malignant neoplasm of urinary bladder, unspecified site [facility]) [C67.9]   Pre-op diagnosis: BLADDER CANCER   Location: [facility] ROOM 03 / [facility]   Surgeons: Heloise Purpura, MD       DISCUSSION:78 y.o. former smoker with h/o HTN, atrial fibrillation s/p Watchman, CAD, CHF, DM II, CKD Stage III, bladder cancer scheduled for above procedure 03/06/2024 with Dr. Heloise Purpura.   Per cardiology preoperative evaluation 02/14/2024, "Chart reviewed as part of pre-operative protocol coverage. Brady Schiller was last seen on 01/16/2024 by Carlean Jews, PA-C.  Case was reviewed with Dr. Jimmey Ralph. Per Carlean Jews, "this was already reviewed by Dr. Jimmey Ralph and his recommendation was that after his TEE 3/31 (performed and look good) he would be to perform cystoscopy or biopsy anytime. Plan to transition his Plavix to aspirin 81mg  for whatever duration Dr. Laverle Patter needs for the procedure then back to Plavix when safe."   Therefore, based on ACC/AHA guidelines, the patient would be an acceptable risk for the planned procedure without further cardiovascular testing.    Per office protocol, he may hold Plavix for 5 days prior to procedure and should resume as soon as hemodynamically stable postoperatively. Patient should take Aspirin 81 mg daily throughout perioperative period. He will stop aspirin when he is able to resume Plavix postoperatively. "  VS: Ht 5\' 11"  (1.803 m)   Wt 98.9 kg   BMI 30.40 kg/m   PROVIDERS: Loyola Mast, MD is PCP   Cardiologist - Rollene Rotunda, MD  LABS: Labs reviewed: Acceptable for surgery. (all labs ordered are listed, but only abnormal results are displayed)  Labs Reviewed - No data to display   IMAGES:   EKG:   CV: Echo 02/11/2024  1. 27 mm Watchman FLX is LAA. No device leak or device-related  thrombus.  Left atrial size was severely dilated. No left atrial/left atrial  appendage thrombus was detected.   2. Left ventricular ejection fraction, by estimation, is 60 to 65%. The  left ventricle has normal function. The left ventricle has no regional  wall motion abnormalities.   3. Right ventricular systolic function is normal. The right ventricular  size is normal.   4. Right atrial size was mild to moderately dilated.   5. The mitral valve is grossly normal. Mild to moderate mitral valve  regurgitation. No evidence of mitral stenosis.   6. The aortic valve is tricuspid. Aortic valve regurgitation is trivial.  No aortic stenosis is present.   7. There is mild (Grade II) protruding plaque involving the descending  aorta and aortic arch.   8. 3D performed of the LAA and demonstrates 3D LAA assessment of Watchman  device. Normal.  Past Medical History:  Diagnosis Date   Abnormal radiologic findings on diagnostic imaging of renal pelvis, ureter, or bladder    bilateral ureter abnormalities   Anticoagulant long-term use    eliquis   Anxiety    pt denies   Arthritis    Atrial fibrillation, chronic (HCC)    CAD (coronary artery disease) cardiologist-- dr hochrein   NSTEMI 02-04-2014  per cardiac cath chronic occluded RCA w/ faint left-to-right collaterals and aneurysmal LCFx with sluggish coronary flow/   NSTEMI --11-21-2016 per cardiac cath occluded proximal RCA & mid to diastal CFX 100%, med rx. If that does not work, PTCA or CABG  Cancer [facility])    bladder and kidney   CHF (congestive heart failure) (HCC)    CKD (chronic kidney disease), stage III (HCC)    patient unaware   DOE (dyspnea on exertion)    Fatty liver    pt denies   Hematuria 02/2019   History of COVID-19 10/2019   History of non-ST elevation myocardial infarction (NSTEMI)    02-04-2014  and 11-21-2016  cardiac cath done both times ,  medically management   History of shingles 12/2017   slight pain and  numbness still noted in the area   Hyperlipidemia    Hypertension    Insomnia    Myocardial infarction [facility]) 2015   Persistent atrial fibrillation [facility])    cardiologsit-- dr hochrein   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 Jimmey Ralph   Thoracic aortic atherosclerosis (HCC)    Type 2 diabetes mellitus (HCC)    Urinary frequency     Past Surgical History:  Procedure Laterality Date   CARDIAC CATHETERIZATION N/A 11/21/2016   Procedure: Left Heart Cath and Coronary Angiography;  Surgeon: Lennette Bihari, MD;  Location: [facility] INVASIVE CV LAB;  Service: Cardiovascular;  Laterality: N/A;  pRCA 100% , ostial LAD 45%, OM3 80%, mCFX to dCFX 100% (AV groove), lateral OM3 50%   COLONOSCOPY     CYSTOSCOPY WITH BIOPSY N/A 08/12/2020   Procedure: CYSTOSCOPY WITH BLADDER BIOPSY AND TRANSURETHRAL RESECTION OF BLADDER TUMOR;  Surgeon: Heloise Purpura, MD;  Location[facility];  Service: Urology;  Laterality: N/A;   CYSTOSCOPY WITH BIOPSY N/A 11/10/2021   Procedure: CYSTOSCOPY WITH BLADDER BIOPSIES/ LEFT RETROGRADE;  Surgeon: Heloise Purpura, MD;  [facility];  Service: Urology;  Laterality: N/A;   CYSTOSCOPY WITH BIOPSY Left 07/27/2022   Procedure: CYSTOSCOPY WITH  BLADDER BIOPSY, TRANSURETHRAL FULGERATION OF BLADDER, EXAM UNDER ANESTHESIA, LEFT RETROGRADE PYELOGRAM;  Surgeon: Heloise Purpura, MD;  [facility];  Service: Urology;  Laterality: Left;   CYSTOSCOPY WITH BIOPSY N/A 04/23/2023   Procedure: CYSTOSCOPY WITH BLADDER AND PROSTATIC URETHRAL BIOPSIES;  Surgeon: Heloise Purpura, MD;  [facility];  Service: Urology;  Laterality: N/A;  60 MINUTES NEEDED FOR CASE   CYSTOSCOPY WITH URETEROSCOPY AND STENT PLACEMENT Right 12/15/2019   Procedure: CYSTOSCOPY WITH RIGHT RETROGRADE/ RIGHT URETEROSCOPY/ BIOPSY;  Surgeon: Ihor Gully, MD;  Location: [facility];  Service: Urology;  Laterality: Right;    CYSTOSCOPY/RETROGRADE/URETEROSCOPY Bilateral 03/18/2018   Procedure: CYSTOSCOPY/RETROGRADE/URETEROSCOPY.;  Surgeon: Ihor Gully, MD;  Location: [facility];  Service: Urology;  Laterality: Bilateral;   EYE SURGERY Bilateral    cateract in January 2023   HYDROCELE EXCISION Left 07/27/2022   Procedure: HYDROCELE REPAIR;  Surgeon: Heloise Purpura, MD;  [facility];  Service: Urology;  Laterality: Left;  GENERAL ANESTHESIA WITH PARALYSIS   KNEE ARTHROSCOPY Right    LEFT ATRIAL APPENDAGE OCCLUSION N/A 12/13/2023   Procedure: LEFT ATRIAL APPENDAGE OCCLUSION;  Surgeon: Nobie Putnam, MD;  Location: [facility] INVASIVE CV LAB;  Service: Cardiovascular;  Laterality: N/A;   LEFT HEART CATHETERIZATION WITH CORONARY ANGIOGRAM N/A 02/04/2014   Procedure: LEFT HEART CATHETERIZATION WITH CORONARY ANGIOGRAM;  Surgeon: Iran Ouch, MD;  Location: [facility] C[facility];  Service: Cardiovascular;  Laterality: N/A;  severe one-vessel CAD, chronically occluded RCA with faint left-to-right collaterals;  aneurysmal LCFx with sluggish coronary flow;  normal LVSF w/ moderately elevated LVEDP (ostialOM2 20%, pOM3 20%, pD1 20%, mCFX 50%, diffuse 20% pCFX)  PROSTATE BIOPSY N/A 08/12/2020   Procedure: BIOPSY TRANSRECTAL ULTRASONIC PROSTATE (TUBP);  Surgeon: Florencio Hunting, MD;  [facility];  Service: Urology;  Laterality: N/A;   ROBOT ASSITED LAPAROSCOPIC NEPHROURETERECTOMY Right 03/22/2020   Procedure: XI ROBOT ASSITED LAPAROSCOPIC NEPHROURETERECTOMY;  Surgeon: Florencio Hunting, MD;  [facility];  Service: Urology;  Laterality: Right;   TRANSESOPHAGEAL ECHOCARDIOGRAM (CATH LAB) N/A 12/13/2023   Procedure: TRANSESOPHAGEAL ECHOCARDIOGRAM;  Surgeon: Ardeen Kohler, MD;  Location: [facility] INVASIVE CV LAB;  Service: Cardiovascular;  Laterality: N/A;   TRANSESOPHAGEAL ECHOCARDIOGRAM (CATH LAB) N/A 02/11/2024   Procedure: TRANSESOPHAGEAL ECHOCARDIOGRAM;  Surgeon: Harrold Lincoln, MD;  Location: [facility] INVASIVE CV LAB;   Service: Cardiovascular;  Laterality: N/A;   TRANSTHORACIC ECHOCARDIOGRAM  11-22-2016   dr hochrein   ef 55-60%/ mild MR and TR/ moderate LAE   TRANSURETHRAL RESECTION OF BLADDER TUMOR N/A 02/10/2021   Procedure: TRANSURETHRAL RESECTION OF BLADDER TUMOR (TURBT)/ CYSTOSCOPY/ LEFT RETROGRADE;  Surgeon: Florencio Hunting, MD;  [facility];  Service: Urology;  Laterality: N/A;  GENERAL ANESTHESIA WITH PARALYSIS    MEDICATIONS:  amLODipine (NORVASC) 5 MG tablet   Cholecalciferol (VITAMIN D3) 1000 units CAPS   clopidogrel (PLAVIX) 75 MG tablet   furosemide (LASIX) 20 MG tablet   hydrOXYzine (VISTARIL) 25 MG capsule   isosorbide mononitrate (IMDUR) 30 MG 24 hr tablet   JANUVIA 25 MG tablet   metoprolol tartrate (LOPRESSOR) 25 MG tablet   nitrofurantoin, macrocrystal-monohydrate, (MACROBID) 100 MG capsule   nitroGLYCERIN (NITROSTAT) 0.4 MG SL tablet   olmesartan (BENICAR) 40 MG tablet   oxybutynin (DITROPAN XL) 15 MG 24 hr tablet   Pembrolizumab (KEYTRUDA IV)   phenazopyridine (PYRIDIUM) 200 MG tablet   rosuvastatin (CRESTOR) 40 MG tablet   sodium bicarbonate 650 MG tablet   tamsulosin (FLOMAX) 0.4 MG CAPS capsule   traZODone (DESYREL) 50 MG tablet   No current facility-administered medications for this encounter.   Chick Cotton Ward, PA-C [facility] [phone]

## 2024-03-05 NOTE — H&P (Signed)
 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 Prostate cancer  4. Left hydrocele  5. BPH/hematuria   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 He began treatment with IV pembrolizumab  every 3 weeks starting back in August. He has tolerated this relatively well. He does have some pruritus although it is unclear whether this was chronic at baseline or related to his therapy. Since his last visit, he did undergo a Watchman procedure and is now off Eliquis . However, he is now on Plavix  and ideally would continue this through July 2025. He does complain of significant dysuria. This does not occur every time he voids but is more intermittent. He continues to have intermittent hematuria as well. He follows up today for surveillance cystoscopy. Finally, he continues to have significant urinary urgency and frequency symptoms which have been not particularly well treated with oxybutynin .     ALLERGIES: Xarelto  - Hives    MEDICATIONS: Amlodipine  Besylate 5 mg tablet 1 tablet PO Daily  Eliquis  5 mg tablet Oral  Farxiga 10 mg tablet 1 tablet PO Daily  Furosemide  20 mg tablet 1 tablet PO Daily  Isosorbide  Mononitrate Er 30 mg tablet, extended release 24 hr 1 tablet PO Daily  Januvia  25 mg tablet 1 tablet PO Daily  Metoprolol  Tartrate 25 mg tablet 1 tablet PO Daily  Nitrostat  0.4 MG Sublingual Tablet Sublingual Sublingual  Olmesartan  Medoxomil 40 mg tablet 1 tablet PO Daily  Rosuvastatin  Calcium  40 mg tablet 1 tablet PO Daily     GU PSH: Bladder Instill AntiCA Agent - 2023, 2023, 2023, 2022, 2022, 2022, 2022, 2022, 2022, 2021, 2021, 2021, 2021, 2021, 2021, 2021, 2021, 2021, 2021, 2021, 2021 Cysto Bladder Ureth Biopsy - 07/27/2022, 11/10/2021, 2021 Cysto Uretero Biopsy Fulgura, Right - 2021 Cystoscopy - 09/26/2023, 02/28/2023, 11/17/2022, 06/20/2022,  03/03/2022, 09/28/2021, 2022, 2022, 2021, 2021, 2019 Cystoscopy TURBT 2-5 cm - 2022, 2021 Cystoscopy Ureteroscopy, Bilateral - 2019 Inject For cystogram - 2021 Lap Nephro Ureterectomy, Right - 2021 Locm 300-399Mg /Ml Iodine,1Ml - 2021, 2021, 2019 Prostate Needle Biopsy - 2021, 2018 Repair Hydrocele - 07/27/2022       PSH Notes: Knee Arthroscopy   NON-GU PSH: Surgical Pathology, Gross And Microscopic Examination For Prostate Needle - 2018     GU PMH: BPH w/LUTS - 09/26/2023, - 12/27/2022, - 11/10/2022 (Stable), - 06/20/2022 (Stable), He has some BPH by exam with nocturia 2 but he said it is not significant enough that he would want to consider any form of pharmacologic therapy at this time., - 2017, Benign prostatic hyperplasia with urinary obstruction, - 2016 CIS of the bladder - 09/26/2023, - 05/15/2023, - 08/16/2022, - 02/27/2022, - 2023, - 09/28/2021, - 2022, - 2021 Gross hematuria - 09/26/2023, - 12/27/2022, Continues to have intermittent gross hematuria. It seems to be associated with some pain in his right flank region but no history of stones. With his previous abnormality of his ureter I have recommended we repeat a CT scan with and without contrast and then will determine further evaluation once that is been completed., - 2020, He experienced gross hematuria. He is on Eliquis . My suspicion is that this is going to be from the prostate since no other abnormality was noted previously., - 2020 Prostate Cancer - 09/26/2023, - 05/15/2023, - 02/28/2023, - 06/20/2022, - 03/03/2022, - 2023, - 2022, - 2022, - 2022, - 2021 (Stable), His prostate  remains benign to exam. I will obtain a PSA and have recommended he return in 6 months for repeat DRE and PSA., - 2020 (Stable), His prostate was noted to be entirely benign today. I will obtain a PSA today since he has due for a recheck., - 2019 (Stable), I went over his pathology report with him today which has revealed no evidence of grade or stage progression of his  low risk prostate cancer. My recommendation to him has been continued active surveillance and he has agreed with this plan., - 2018 (Stable), I have discussed with the patient the possibility of blood per rectum, per urethra and in the ejaculate. He was counseled to contact me if he has any difficulties following his prostate biopsy whatsoever., - 2018 (Stable), His prostate was noted to be smooth and benign to examination however his PSA has risen significantly. It was 8.3 when we did his biopsy initially and then came down quite low. I recommended we repeat the PSA today but also I told him this time we proceed with a repeat biopsy., - 2018 (Stable), His prostate remains benign on his examination and his PSA continues to remain low and stable. I will continue active surveillance with DRE and PSA again in 6 months., - 2017, Adenocarcinoma of prostate, - 2017 Urinary Frequency - 09/26/2023, - 12/27/2022 Acute Cystitis/UTI - 06/19/2023 Bladder Cancer overlapping sites - 04/17/2023, - 12/27/2022, - 11/10/2022, - 08/01/2022, - 06/20/2022, - 03/03/2022, - 2023, - 2023, - 2023, - 2023, - 2022, - 2022, - 2022, - 2022, - 2022, - 2022, - 2022, - 2022, - 2022, - 2022, - 2021, - 2021, - 2021, - 2021, - 2021, - 2021, - 2021, - 2021 History of bladder cancer - 02/28/2023, - 02/23/2023, - 11/17/2022 Renal pelvis cancer, right - 02/28/2023, - 02/27/2022, - 2022, - 2022, - 2022, - 2021 (Stable), - 2021, Right, - 2021 Dysuria - 11/10/2022 Nocturia (Stable) - 11/10/2022, (Stable), His nocturia is not a significant bother to him., - 2017 Hydrocele - 08/16/2022, - 08/01/2022, - 06/27/2022, - 06/20/2022 Weak Urinary Stream - 06/20/2022 Hydrocele, Unspec - 2023, Hydrocele, left, - 2015 Microscopic hematuria - 09/28/2021 Abdominal Pain Unspec - 2022 Abnormal radiologic findings on diagnostic imaging of renal pelvis, ureter, or bladder, Bilateral - 2021, Bilateral, Due to the finding of abnormalities of both his right ureter and left renal pelvic  region we are going to proceed with further evaluation with cystoscopy, bilateral retrograde pyelography, bilateral ureteroscopy and possible bilateral double-J stent placement., - 2019 Hydronephrosis (Stable), Right, He has mild right hydronephrosis noted on his CT scan that is unchanged and has been evaluated and noted to be nonobstructive in nature. - 2020 Microscopic hematuria, He was noted to have microscopic hematuria today. We therefore have discussed the need for further evaluation. He did not appear to have any infection but did have a few white cells so I am going to culture his urine. We will then obtain a creatinine and schedule him for a CT scan to evaluate the upper tract and will then have him return for lower tract evaluation with cystoscopy. - 2019 BPH w/o LUTS (Stable), He does have some slight prostatic enlargement on exam but does not have significant voiding symptoms. 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 He is s/p  right RAL nephroureterectomy on 03/22/20.   Diagnosis: Extensive CIS of the right renal pelvis and ureter  Baseline renal function: Cr 1.77   Feb 2021: CIS of the bladder and right upper tract CIS  Feb-Mar 2021: 6 week induction BCG  May 2021: Right RAL nephroureterectomy  Aug 2021: Diffuse erythematous changes of bladder  Sep 2021: TUR of bladder tumor, High grade Ta urothelial carcinoma  Oct-Dec 2021: Repeat 6 week induction BCG  Mar 2022: Suspicious cystoscopy and cytology, TURBT/biopsy - Benign  May-Jun 2022: 3 week maintenance BCG  Aug 2022: 3 week maintenance BCG  Nov 2022: Suspicious cytology  Dec 2022: Bladder biopsies - Inflammation, no malignancy, L RPG - normal  Feb-Mar 2023: 3 week maintenance BCG  Apr 2023: Negative cystoscopy but suspicious cytology (discussed going back  to the OR for further evaluation but he declined base on negative evaluation in Dec 2022)  Sep 2023: Bladder biopsies - benign  Jan 2024: Positive cytology for high grade urothelial carcinoma (discussed options and elected to proceed with repeat upper tract imaging and office cystoscopy considering the multiple endoscopic evaluations despite concerning cytologies  Jun 2024: Repeat bladder biopsies and prostatic urethral biopsies for persistent positive cytology - CIS  Aug 2024: Began IV pembrolizomab under the care of Dr. Maryalice Smaller   2) Prostate cancer: His PSA in 1/13 was found to be 8.33. He had no worrisome nodularity or induration noted on DRE but did have a mild rectal stricture. He underwent TRUS/BX on 01/18/12 at which time his prostate was noted to be 56 cc.  Pathology: He was found to have a single core positive for Gleason 3+3 = 6 adenocarcinoma involving 10% of that core.  Treatment: Active surveillance  Repeat TRUS/BX 07/24/17: Prostate volume - 57 cc  Pathology: 1 positive for Gleason 6 in 5% from the left apex laterally.  Treatment: Continued active surveillance.   Surveillance:  Sep 2021: 12 core biopsy - Benign with chronic inflammation, Vol 53 cc   3) BPH with LUTS: He reported having some slowing of his urinary stream as well as nocturia.  Treatment: None currently   4) Left hydrocele: At one point he found it to be a bother but he says it really not giving him any trouble other than occasionally when he crosses his legs. It became more symptomatic during 2023.   Sep 2023: Left hydrocele repair       NON-GU PMH: Bacteriuria (Stable) - 02/28/2023, (Stable), - 11/17/2022 (Stable), - 2022, - 2021 Hypertensive urgency - 11/10/2022 Neoplasm of uncertain behavior of trachea, bronchus and lung - 2021 Atherosclerotic heart disease of native coronary artery without angina pectoris, CAD (coronary artery disease) - 2016 Stenosis of anus and rectum, Rectal stricture - 2016 Atrial  Fibrillation Diabetes Type 2 Hypercholesterolemia Hypertension    FAMILY HISTORY: Family Health Status - Mother's Age - Runs In Family Family Health Status Number - Runs In Family Father Deceased At [employer] ___ - Runs In Family Hypertension - Mother   SOCIAL HISTORY: Marital Status: Married Preferred Language: English; Race: White Has never drank.  Drinks 3 caffeinated drinks per day.     Notes: Former smoker, Occupation:, Marital History - Currently Married, Caffeine Use, Alcohol Use   REVIEW OF SYSTEMS:    GU Review Male:   Patient denies frequent urination, hard to postpone urination, burning/ pain with urination, get up at night to urinate, leakage of urine, stream starts and stops, trouble starting your streams, and have to strain to urinate .  Gastrointestinal (Lower):   Patient denies diarrhea and constipation.  Gastrointestinal (Upper):   Patient denies vomiting and nausea.  Constitutional:   Patient denies fever, night sweats, weight loss, and fatigue.  Skin:   Patient denies skin rash/ lesion and itching.  Eyes:   Patient denies blurred vision and double vision.  Ears/ Nose/ Throat:   Patient denies sore throat and sinus problems.  Hematologic/Lymphatic:   Patient denies swollen glands and easy bruising.  Cardiovascular:   Patient denies leg swelling and chest pains.  Respiratory:   Patient denies cough and shortness of breath.  Endocrine:   Patient denies excessive thirst.  Musculoskeletal:   Patient denies back pain and joint pain.  Neurological:   Patient denies headaches and dizziness.  Psychologic:   Patient denies depression and anxiety.      MULTI-SYSTEM PHYSICAL EXAMINATION:    Constitutional: Well-nourished. No physical deformities. Normally developed. Good grooming.  CV: RRR Lungs: Clear   Complexity of Data:  Records Review:   Previous Patient Records   06/05/23 06/20/22 02/27/22 05/27/21 06/15/20 10/21/19 02/27/19 01/08/18  PSA  Total PSA 0.38 ng/mL  4.76 ng/mL 9.66 ng/mL 5.54 ng/mL 8.69 ng/mL 2.39 ng/mL 3.27 ng/mL 1.67 ng/mL           ASSESSMENT:      ICD-10 Details  1 GU:   History of bladder cancer - Z85.51   2   Prostate Cancer - C61   3   BPH w/LUTS - N40.1   4   Weak Urinary Stream - R39.12    PLAN:            1. Urothelial carcinoma: Bladder washing has been obtained for cytology today which may be helpful. If this remains suspicious or positive, it may be best for him to proceed with cystoscopy in the operating room so I can visualize the urothelium better and consider performing bladder biopsies. Of course, he would need to come off his antiplatelet therapy temporarily for this to be performed and this would need to be discussed with cardiology. If his cytology is negative or not particularly suspicious, it may be beneficial to continue with IV pembrolizumab  to get until the summer when he can safely come off his antiplatelet therapy and consider further evaluation at that time. Unfortunately, I was unable to visualize his urothelium well today in the office.   At the very least, he will be scheduled for surveillance cystoscopy this summer pending his cytology result from today.   2. Prostate cancer: His PSA will be checked this summer. This is a relatively low priority considering his urothelial carcinoma.   3. BPH/LUTS/hematuria/dysuria: He does have Pyridium  at home and will use this as needed for his dysuria. He has been provided samples of Myrbetriq 25 mg to see if this would help his urinary urgency symptoms. I am hopeful that his hematuria will be better managed once he is able to stop Plavix  in the summer.      APPENDED NOTES:    I reviewed his cytology result with him that was suspicious for high-grade urothelial carcinoma. I have communicated with his cardiology team and it appears that he would be able to temporarily stop his Plavix  if necessary considering his cancer situation. In addition, I have communicated  with Dr. Maryalice Smaller. We will proceed with cystoscopy and bladder biopsies in April. He does have an upcoming transesophageal echocardiogram on March 31 to confirm that his Watchman procedure was successful.

## 2024-03-05 NOTE — Anesthesia Preprocedure Evaluation (Signed)
 Anesthesia Evaluation  Patient identified by MRN, date of birth, ID band Patient awake    Reviewed: Allergy & Precautions, NPO status , Patient's Chart, lab work & pertinent test results  History of Anesthesia Complications Negative for: history of anesthetic complications  Airway Mallampati: III  TM Distance: >3 FB Neck ROM: Full   Comment: Previous grade I view with MAC 4, easy mask Dental  (+) Edentulous Upper, Edentulous Lower, Dental Advisory Given   Pulmonary neg shortness of breath, neg sleep apnea, neg COPD, neg recent URI, former smoker   Pulmonary exam normal breath sounds clear to auscultation       Cardiovascular hypertension (amlodipine , ISMN, metoprolol , olmesartan ), Pt. on medications (-) angina + CAD, + Past MI (NSTEMI 01/2014, 11/2016) and +CHF (EF 60-65%)  + dysrhythmias (Watchman device placed 12/13/2023, RBBB) Atrial Fibrillation + Valvular Problems/Murmurs (mild-to-moderate) MR  Rhythm:Regular Rate:Normal  HLD  TEE 02/11/2024: IMPRESSIONS    1. 27 mm Watchman FLX is LAA. No device leak or device-related thrombus.  Left atrial size was severely dilated. No left atrial/left atrial  appendage thrombus was detected.   2. Left ventricular ejection fraction, by estimation, is 60 to 65%. The  left ventricle has normal function. The left ventricle has no regional  wall motion abnormalities.   3. Right ventricular systolic function is normal. The right ventricular  size is normal.   4. Right atrial size was mild to moderately dilated.   5. The mitral valve is grossly normal. Mild to moderate mitral valve  regurgitation. No evidence of mitral stenosis.   6. The aortic valve is tricuspid. Aortic valve regurgitation is trivial.  No aortic stenosis is present.   7. There is mild (Grade II) protruding plaque involving the descending  aorta and aortic arch.   8. 3D performed of the LAA and demonstrates 3D LAA assessment of  Watchman  device. Normal.   LHC 11/21/2016:  Prox RCA lesion, 100 %stenosed.  Ost LAD lesion, 45 %stenosed.  3rd Mrg lesion, 80 %stenosed.  Mid Cx to Dist Cx lesion, 100 %stenosed.  Lat 3rd Mrg lesion, 50 %stenosed.  Rec: Medical therapy    Neuro/Psych neg Seizures PSYCHIATRIC DISORDERS Anxiety      Neuromuscular disease (sciatica, peripheral neuropathy)    GI/Hepatic negative GI ROS,,,Fatty liver   Endo/Other  diabetes, Type 2    Renal/GU CRFRenal disease (cancer)   Bladder and kidney cancer    Musculoskeletal  (+) Arthritis , Osteoarthritis,    Abdominal   Peds  Hematology negative hematology ROS (+) Lab Results      Component                Value               Date                      WBC                      13.9 (H)            02/21/2024                HGB                      13.5                02/21/2024                HCT  40.9                02/21/2024                MCV                      81.5                02/21/2024                PLT                      220                 02/21/2024              Anesthesia Other Findings Last Plavix : 5 days ago  Reproductive/Obstetrics                             Anesthesia Physical Anesthesia Plan  ASA: 3  Anesthesia Plan: General   Post-op Pain Management: Tylenol  PO (pre-op)*   Induction: Intravenous  PONV Risk Score and Plan: 2 and Ondansetron , Dexamethasone  and Treatment may vary due to age or medical condition  Airway Management Planned: Oral ETT  Additional Equipment:   Intra-op Plan:   Post-operative Plan: Extubation in OR  Informed Consent: I have reviewed the patients History and Physical, chart, labs and discussed the procedure including the risks, benefits and alternatives for the proposed anesthesia with the patient or authorized representative who has indicated his/her understanding and acceptance.     Dental advisory given  Plan  Discussed with: CRNA and Anesthesiologist  Anesthesia Plan Comments: (Risks of general anesthesia discussed including, but not limited to, sore throat, hoarse voice, chipped/damaged teeth, injury to vocal cords, nausea and vomiting, allergic reactions, lung infection, heart attack, stroke, and death. All questions answered. )        Anesthesia Quick Evaluation

## 2024-03-06 ENCOUNTER — Inpatient Hospital Stay (HOSPITAL_COMMUNITY)
Admission: RE | Admit: 2024-03-06 | Discharge: 2024-03-13 | DRG: 669 | Disposition: A | Discharge status: Home / Self Care | Source: Ambulatory Visit | Attending: Urology | Admitting: Urology

## 2024-03-06 ENCOUNTER — Observation Stay (HOSPITAL_COMMUNITY): Admitting: Certified Registered Nurse Anesthetist

## 2024-03-06 ENCOUNTER — Other Ambulatory Visit: Payer: Self-pay

## 2024-03-06 ENCOUNTER — Encounter (HOSPITAL_COMMUNITY)
Admission: RE | Disposition: A | Discharge status: Home / Self Care | Payer: Self-pay | Source: Ambulatory Visit | Attending: Urology

## 2024-03-06 ENCOUNTER — Ambulatory Visit (HOSPITAL_COMMUNITY): Payer: Self-pay | Admitting: Anesthesiology

## 2024-03-06 ENCOUNTER — Ambulatory Visit (HOSPITAL_COMMUNITY): Payer: Self-pay | Admitting: Physician Assistant

## 2024-03-06 ENCOUNTER — Encounter: Payer: Self-pay | Admitting: Hematology

## 2024-03-06 ENCOUNTER — Encounter (HOSPITAL_COMMUNITY): Payer: Self-pay | Admitting: Urology

## 2024-03-06 DIAGNOSIS — N3001 Acute cystitis with hematuria: Secondary | ICD-10-CM | POA: Diagnosis present

## 2024-03-06 DIAGNOSIS — I252 Old myocardial infarction: Secondary | ICD-10-CM | POA: Diagnosis not present

## 2024-03-06 DIAGNOSIS — D090 Carcinoma in situ of bladder: Secondary | ICD-10-CM | POA: Diagnosis present

## 2024-03-06 DIAGNOSIS — E871 Hypo-osmolality and hyponatremia: Secondary | ICD-10-CM | POA: Diagnosis present

## 2024-03-06 DIAGNOSIS — N3020 Other chronic cystitis without hematuria: Secondary | ICD-10-CM | POA: Diagnosis not present

## 2024-03-06 DIAGNOSIS — M79671 Pain in right foot: Secondary | ICD-10-CM | POA: Diagnosis not present

## 2024-03-06 DIAGNOSIS — N401 Enlarged prostate with lower urinary tract symptoms: Secondary | ICD-10-CM | POA: Diagnosis present

## 2024-03-06 DIAGNOSIS — N189 Chronic kidney disease, unspecified: Secondary | ICD-10-CM | POA: Diagnosis not present

## 2024-03-06 DIAGNOSIS — N35919 Unspecified urethral stricture, male, unspecified site: Secondary | ICD-10-CM | POA: Diagnosis not present

## 2024-03-06 DIAGNOSIS — R000 Tachycardia, unspecified: Secondary | ICD-10-CM

## 2024-03-06 DIAGNOSIS — R338 Other retention of urine: Secondary | ICD-10-CM | POA: Diagnosis present

## 2024-03-06 DIAGNOSIS — I452 Bifascicular block: Secondary | ICD-10-CM | POA: Diagnosis present

## 2024-03-06 DIAGNOSIS — C678 Malignant neoplasm of overlapping sites of bladder: Secondary | ICD-10-CM | POA: Diagnosis not present

## 2024-03-06 DIAGNOSIS — I4821 Permanent atrial fibrillation: Secondary | ICD-10-CM | POA: Diagnosis present

## 2024-03-06 DIAGNOSIS — R310 Gross hematuria: Secondary | ICD-10-CM | POA: Diagnosis not present

## 2024-03-06 DIAGNOSIS — E875 Hyperkalemia: Secondary | ICD-10-CM | POA: Diagnosis present

## 2024-03-06 DIAGNOSIS — D696 Thrombocytopenia, unspecified: Secondary | ICD-10-CM | POA: Diagnosis not present

## 2024-03-06 DIAGNOSIS — Z8551 Personal history of malignant neoplasm of bladder: Secondary | ICD-10-CM | POA: Diagnosis not present

## 2024-03-06 DIAGNOSIS — I959 Hypotension, unspecified: Secondary | ICD-10-CM | POA: Diagnosis not present

## 2024-03-06 DIAGNOSIS — E1159 Type 2 diabetes mellitus with other circulatory complications: Secondary | ICD-10-CM

## 2024-03-06 DIAGNOSIS — N1330 Unspecified hydronephrosis: Secondary | ICD-10-CM | POA: Diagnosis not present

## 2024-03-06 DIAGNOSIS — Z8559 Personal history of malignant neoplasm of other urinary tract organ: Secondary | ICD-10-CM

## 2024-03-06 DIAGNOSIS — I5032 Chronic diastolic (congestive) heart failure: Secondary | ICD-10-CM | POA: Diagnosis present

## 2024-03-06 DIAGNOSIS — Z8616 Personal history of COVID-19: Secondary | ICD-10-CM

## 2024-03-06 DIAGNOSIS — D62 Acute posthemorrhagic anemia: Secondary | ICD-10-CM | POA: Insufficient documentation

## 2024-03-06 DIAGNOSIS — Z6831 Body mass index (BMI) 31.0-31.9, adult: Secondary | ICD-10-CM

## 2024-03-06 DIAGNOSIS — E7800 Pure hypercholesterolemia, unspecified: Secondary | ICD-10-CM | POA: Diagnosis present

## 2024-03-06 DIAGNOSIS — Z7982 Long term (current) use of aspirin: Secondary | ICD-10-CM

## 2024-03-06 DIAGNOSIS — E1122 Type 2 diabetes mellitus with diabetic chronic kidney disease: Secondary | ICD-10-CM | POA: Diagnosis present

## 2024-03-06 DIAGNOSIS — E669 Obesity, unspecified: Secondary | ICD-10-CM | POA: Diagnosis present

## 2024-03-06 DIAGNOSIS — Z888 Allergy status to other drugs, medicaments and biological substances status: Secondary | ICD-10-CM

## 2024-03-06 DIAGNOSIS — I2510 Atherosclerotic heart disease of native coronary artery without angina pectoris: Secondary | ICD-10-CM | POA: Diagnosis present

## 2024-03-06 DIAGNOSIS — N184 Chronic kidney disease, stage 4 (severe): Secondary | ICD-10-CM | POA: Diagnosis present

## 2024-03-06 DIAGNOSIS — Z95818 Presence of other cardiac implants and grafts: Secondary | ICD-10-CM

## 2024-03-06 DIAGNOSIS — C689 Malignant neoplasm of urinary organ, unspecified: Principal | ICD-10-CM

## 2024-03-06 DIAGNOSIS — I10 Essential (primary) hypertension: Secondary | ICD-10-CM | POA: Diagnosis not present

## 2024-03-06 DIAGNOSIS — C679 Malignant neoplasm of bladder, unspecified: Secondary | ICD-10-CM

## 2024-03-06 DIAGNOSIS — Z8554 Personal history of malignant neoplasm of ureter: Secondary | ICD-10-CM

## 2024-03-06 DIAGNOSIS — I130 Hypertensive heart and chronic kidney disease with heart failure and stage 1 through stage 4 chronic kidney disease, or unspecified chronic kidney disease: Secondary | ICD-10-CM | POA: Diagnosis present

## 2024-03-06 DIAGNOSIS — Z8546 Personal history of malignant neoplasm of prostate: Secondary | ICD-10-CM

## 2024-03-06 DIAGNOSIS — Z7901 Long term (current) use of anticoagulants: Secondary | ICD-10-CM

## 2024-03-06 DIAGNOSIS — Z79899 Other long term (current) drug therapy: Secondary | ICD-10-CM

## 2024-03-06 DIAGNOSIS — N179 Acute kidney failure, unspecified: Secondary | ICD-10-CM | POA: Diagnosis present

## 2024-03-06 DIAGNOSIS — N3091 Cystitis, unspecified with hematuria: Secondary | ICD-10-CM | POA: Diagnosis not present

## 2024-03-06 DIAGNOSIS — Z905 Acquired absence of kidney: Secondary | ICD-10-CM

## 2024-03-06 DIAGNOSIS — R319 Hematuria, unspecified: Secondary | ICD-10-CM

## 2024-03-06 DIAGNOSIS — N3289 Other specified disorders of bladder: Secondary | ICD-10-CM | POA: Diagnosis not present

## 2024-03-06 DIAGNOSIS — I4891 Unspecified atrial fibrillation: Secondary | ICD-10-CM | POA: Diagnosis not present

## 2024-03-06 DIAGNOSIS — Z87440 Personal history of urinary (tract) infections: Secondary | ICD-10-CM

## 2024-03-06 DIAGNOSIS — Z7984 Long term (current) use of oral hypoglycemic drugs: Secondary | ICD-10-CM

## 2024-03-06 DIAGNOSIS — Z79630 Long term (current) use of alkylating agent: Secondary | ICD-10-CM

## 2024-03-06 DIAGNOSIS — Z8249 Family history of ischemic heart disease and other diseases of the circulatory system: Secondary | ICD-10-CM

## 2024-03-06 DIAGNOSIS — T83022A Displacement of nephrostomy catheter, initial encounter: Secondary | ICD-10-CM | POA: Diagnosis not present

## 2024-03-06 DIAGNOSIS — Z8553 Personal history of malignant neoplasm of renal pelvis: Secondary | ICD-10-CM

## 2024-03-06 LAB — GLUCOSE, CAPILLARY
Glucose-Capillary: 125 mg/dL — ABNORMAL HIGH (ref 70–99)
Glucose-Capillary: 133 mg/dL — ABNORMAL HIGH (ref 70–99)
Glucose-Capillary: 188 mg/dL — ABNORMAL HIGH (ref 70–99)
Glucose-Capillary: 172 mg/dL — ABNORMAL HIGH (ref 70–99)

## 2024-03-06 LAB — HEMOGLOBIN AND HEMATOCRIT, BLOOD
HCT: 42.8 % (ref 39.0–52.0)
Hemoglobin: 13.7 g/dL (ref 13.0–17.0)

## 2024-03-06 SURGERY — CYSTOSCOPY, WITH BIOPSY
Anesthesia: General

## 2024-03-06 SURGERY — CYSTOSCOPY, WITH BLADDER FULGURATION
Anesthesia: General

## 2024-03-06 MED ORDER — ONDANSETRON HCL 4 MG/2ML IJ SOLN
INTRAMUSCULAR | Status: AC
  Filled 2024-03-06: qty 2

## 2024-03-06 MED ORDER — HYOSCYAMINE SULFATE 0.125 MG SL SUBL
SUBLINGUAL_TABLET | SUBLINGUAL | Status: AC
  Filled 2024-03-06: qty 1

## 2024-03-06 MED ORDER — ONDANSETRON HCL 4 MG/2ML IJ SOLN: 4.0000 mg | INTRAMUSCULAR | Status: DC | PRN

## 2024-03-06 MED ORDER — 0.9 % SODIUM CHLORIDE (POUR BTL) OPTIME
TOPICAL | Status: DC | PRN
  Administered 2024-03-06 (×2): 1000 mL

## 2024-03-06 MED ORDER — IRBESARTAN 300 MG PO TABS
300.0000 mg | ORAL_TABLET | Freq: Every day | ORAL | Status: DC
  Filled 2024-03-06: qty 1

## 2024-03-06 MED ORDER — SODIUM CHLORIDE 0.9% FLUSH
3.0000 mL | Freq: Two times a day (BID) | INTRAVENOUS | Status: DC
  Administered 2024-03-07 (×2): 10 mL via INTRAVENOUS
  Administered 2024-03-07: 3 mL via INTRAVENOUS
  Administered 2024-03-08 – 2024-03-09 (×4): 10 mL via INTRAVENOUS
  Administered 2024-03-10: 3 mL via INTRAVENOUS
  Administered 2024-03-10: 10 mL via INTRAVENOUS
  Administered 2024-03-11 – 2024-03-12 (×3): 3 mL via INTRAVENOUS
  Administered 2024-03-12 – 2024-03-13 (×2): 10 mL via INTRAVENOUS

## 2024-03-06 MED ORDER — SODIUM CHLORIDE 0.9% FLUSH
3.0000 mL | Freq: Two times a day (BID) | INTRAVENOUS | Status: DC
Start: 2024-03-06 — End: 2024-03-09
  Administered 2024-03-07 – 2024-03-08 (×5): 3 mL via INTRAVENOUS

## 2024-03-06 MED ORDER — SUGAMMADEX SODIUM 200 MG/2ML IV SOLN
INTRAVENOUS | Status: DC | PRN
  Administered 2024-03-06: 200 mg via INTRAVENOUS

## 2024-03-06 MED ORDER — FENTANYL CITRATE PF 50 MCG/ML IJ SOSY
25.0000 ug | PREFILLED_SYRINGE | INTRAMUSCULAR | Status: DC | PRN
  Administered 2024-03-06 (×3): 50 ug via INTRAVENOUS

## 2024-03-06 MED ORDER — PROPOFOL 10 MG/ML IV BOLUS
INTRAVENOUS | Status: AC
  Filled 2024-03-06: qty 20

## 2024-03-06 MED ORDER — PHENYLEPHRINE 80 MCG/ML (10ML) SYRINGE FOR IV PUSH (FOR BLOOD PRESSURE SUPPORT)
PREFILLED_SYRINGE | INTRAVENOUS | Status: AC
Start: 2024-03-06 — End: ?
  Filled 2024-03-06: qty 10

## 2024-03-06 MED ORDER — ISOSORBIDE MONONITRATE ER 60 MG PO TB24: 30.0000 mg | ORAL_TABLET | Freq: Every day | ORAL | Status: DC

## 2024-03-06 MED ORDER — LACTATED RINGERS IV SOLN: INTRAVENOUS | Status: DC

## 2024-03-06 MED ORDER — SODIUM CHLORIDE 0.9 % IR SOLN
3000.0000 mL | Status: DC
Start: 2024-03-06 — End: 2024-03-07
  Administered 2024-03-06 – 2024-03-07 (×12): 3000 mL

## 2024-03-06 MED ORDER — SODIUM CHLORIDE 0.9% FLUSH: 3.0000 mL | INTRAVENOUS | Status: DC | PRN

## 2024-03-06 MED ORDER — TRAZODONE HCL 50 MG PO TABS
25.0000 mg | ORAL_TABLET | Freq: Every evening | ORAL | Status: DC | PRN
  Administered 2024-03-10 – 2024-03-12 (×3): 50 mg via ORAL
  Filled 2024-03-06 (×3): qty 1

## 2024-03-06 MED ORDER — LIDOCAINE HCL (PF) 2 % IJ SOLN
INTRAMUSCULAR | Status: AC
  Filled 2024-03-06: qty 5

## 2024-03-06 MED ORDER — HYDROMORPHONE HCL 1 MG/ML IJ SOLN
INTRAMUSCULAR | Status: AC
Start: 2024-03-06 — End: 2024-03-07
  Filled 2024-03-06: qty 1

## 2024-03-06 MED ORDER — ROCURONIUM BROMIDE 10 MG/ML (PF) SYRINGE
PREFILLED_SYRINGE | INTRAVENOUS | Status: AC
  Filled 2024-03-06: qty 10

## 2024-03-06 MED ORDER — INSULIN ASPART 100 UNIT/ML IJ SOLN: 0.0000 [IU] | INTRAMUSCULAR | Status: DC | PRN

## 2024-03-06 MED ORDER — ORAL CARE MOUTH RINSE: 15.0000 mL | Freq: Once | OROMUCOSAL | Status: AC

## 2024-03-06 MED ORDER — ALBUMIN HUMAN 5 % IV SOLN
INTRAVENOUS | Status: DC | PRN
Start: 2024-03-06 — End: 2024-03-06

## 2024-03-06 MED ORDER — HYDROMORPHONE HCL 1 MG/ML IJ SOLN
0.2500 mg | INTRAMUSCULAR | Status: AC | PRN
  Administered 2024-03-06 (×3): 0.5 mg via INTRAVENOUS

## 2024-03-06 MED ORDER — ONDANSETRON HCL 4 MG/2ML IJ SOLN
INTRAMUSCULAR | Status: DC | PRN
  Administered 2024-03-06: 4 mg via INTRAVENOUS

## 2024-03-06 MED ORDER — NITROGLYCERIN 0.4 MG SL SUBL: 0.4000 mg | SUBLINGUAL_TABLET | SUBLINGUAL | Status: DC | PRN

## 2024-03-06 MED ORDER — ALBUMIN HUMAN 5 % IV SOLN
INTRAVENOUS | Status: AC
  Filled 2024-03-06: qty 250

## 2024-03-06 MED ORDER — ROCURONIUM BROMIDE 10 MG/ML (PF) SYRINGE
PREFILLED_SYRINGE | INTRAVENOUS | Status: AC
Start: 2024-03-06 — End: ?
  Filled 2024-03-06: qty 10

## 2024-03-06 MED ORDER — SODIUM CHLORIDE 0.9 % IV SOLN
250.0000 mL | INTRAVENOUS | Status: DC | PRN
  Administered 2024-03-06: 1000 mL via INTRAVENOUS

## 2024-03-06 MED ORDER — TRANEXAMIC ACID-NACL 1000-0.7 MG/100ML-% IV SOLN
INTRAVENOUS | Status: DC | PRN
  Administered 2024-03-06: 1000 mg via INTRAVENOUS

## 2024-03-06 MED ORDER — ACETAMINOPHEN 10 MG/ML IV SOLN
INTRAVENOUS | Status: AC
  Filled 2024-03-06: qty 100

## 2024-03-06 MED ORDER — TRANEXAMIC ACID-NACL 1000-0.7 MG/100ML-% IV SOLN
INTRAVENOUS | Status: AC
Start: 2024-03-06 — End: 2024-03-07
  Filled 2024-03-06: qty 100

## 2024-03-06 MED ORDER — PROPOFOL 10 MG/ML IV BOLUS
INTRAVENOUS | Status: DC | PRN
  Administered 2024-03-06: 20 mg via INTRAVENOUS
  Administered 2024-03-06: 120 mg via INTRAVENOUS

## 2024-03-06 MED ORDER — ONDANSETRON HCL 4 MG/2ML IJ SOLN
INTRAMUSCULAR | Status: DC | PRN
Start: 2024-03-06 — End: 2024-03-06
  Administered 2024-03-06: 4 mg via INTRAVENOUS

## 2024-03-06 MED ORDER — FUROSEMIDE 20 MG PO TABS: 20.0000 mg | ORAL_TABLET | Freq: Every day | ORAL | Status: DC

## 2024-03-06 MED ORDER — FENTANYL CITRATE (PF) 100 MCG/2ML IJ SOLN
INTRAMUSCULAR | Status: DC | PRN
  Administered 2024-03-06: 100 ug via INTRAVENOUS

## 2024-03-06 MED ORDER — STERILE WATER FOR IRRIGATION IR SOLN
Status: DC | PRN
Start: 2024-03-06 — End: 2024-03-06
  Administered 2024-03-06: 3000 mL

## 2024-03-06 MED ORDER — PHENYLEPHRINE 80 MCG/ML (10ML) SYRINGE FOR IV PUSH (FOR BLOOD PRESSURE SUPPORT)
PREFILLED_SYRINGE | INTRAVENOUS | Status: DC | PRN
  Administered 2024-03-06 (×3): 80 ug via INTRAVENOUS

## 2024-03-06 MED ORDER — DEXAMETHASONE SODIUM PHOSPHATE 10 MG/ML IJ SOLN
INTRAMUSCULAR | Status: DC | PRN
  Administered 2024-03-06: 10 mg via INTRAVENOUS

## 2024-03-06 MED ORDER — CIPROFLOXACIN IN D5W 400 MG/200ML IV SOLN
400.0000 mg | Freq: Two times a day (BID) | INTRAVENOUS | Status: AC
  Administered 2024-03-06 – 2024-03-07 (×2): 400 mg via INTRAVENOUS
  Filled 2024-03-06 (×2): qty 200

## 2024-03-06 MED ORDER — CEFAZOLIN SODIUM-DEXTROSE 2-4 GM/100ML-% IV SOLN
2.0000 g | INTRAVENOUS | Status: AC
Start: 2024-03-06 — End: 2024-03-06
  Administered 2024-03-06: 2 g via INTRAVENOUS
  Filled 2024-03-06: qty 100

## 2024-03-06 MED ORDER — DEXAMETHASONE SODIUM PHOSPHATE 10 MG/ML IJ SOLN
INTRAMUSCULAR | Status: AC
  Filled 2024-03-06: qty 1

## 2024-03-06 MED ORDER — HYDROCODONE-ACETAMINOPHEN 5-325 MG PO TABS
1.0000 | ORAL_TABLET | ORAL | Status: DC | PRN
  Administered 2024-03-07 – 2024-03-08 (×2): 1 via ORAL
  Administered 2024-03-09 – 2024-03-11 (×5): 2 via ORAL
  Filled 2024-03-06 (×3): qty 2
  Filled 2024-03-06 (×2): qty 1
  Filled 2024-03-06 (×2): qty 2

## 2024-03-06 MED ORDER — AMISULPRIDE (ANTIEMETIC) 5 MG/2ML IV SOLN: 10.0000 mg | Freq: Once | INTRAVENOUS | Status: DC | PRN

## 2024-03-06 MED ORDER — ROCURONIUM BROMIDE 10 MG/ML (PF) SYRINGE
PREFILLED_SYRINGE | INTRAVENOUS | Status: DC | PRN
  Administered 2024-03-06: 50 mg via INTRAVENOUS
  Administered 2024-03-06 (×2): 10 mg via INTRAVENOUS

## 2024-03-06 MED ORDER — ALBUMIN HUMAN 5 % IV SOLN
12.5000 g | Freq: Once | INTRAVENOUS | Status: AC
  Administered 2024-03-06: 12.5 g via INTRAVENOUS

## 2024-03-06 MED ORDER — FENTANYL CITRATE (PF) 100 MCG/2ML IJ SOLN
INTRAMUSCULAR | Status: AC
  Filled 2024-03-06: qty 2

## 2024-03-06 MED ORDER — ACETAMINOPHEN 10 MG/ML IV SOLN
INTRAVENOUS | Status: DC | PRN
Start: 2024-03-06 — End: 2024-03-06
  Administered 2024-03-06: 1000 mg via INTRAVENOUS

## 2024-03-06 MED ORDER — ROSUVASTATIN CALCIUM 20 MG PO TABS
40.0000 mg | ORAL_TABLET | Freq: Every day | ORAL | Status: DC
  Administered 2024-03-08 – 2024-03-13 (×6): 40 mg via ORAL
  Filled 2024-03-06 (×6): qty 2

## 2024-03-06 MED ORDER — AMLODIPINE BESYLATE 5 MG PO TABS: 5.0000 mg | ORAL_TABLET | Freq: Every day | ORAL | Status: DC

## 2024-03-06 MED ORDER — LIDOCAINE 2% (20 MG/ML) 5 ML SYRINGE
INTRAMUSCULAR | Status: DC | PRN
  Administered 2024-03-06: 100 mg via INTRAVENOUS

## 2024-03-06 MED ORDER — FENTANYL CITRATE (PF) 250 MCG/5ML IJ SOLN
INTRAMUSCULAR | Status: DC | PRN
  Administered 2024-03-06 (×2): 50 ug via INTRAVENOUS

## 2024-03-06 MED ORDER — PHENYLEPHRINE 80 MCG/ML (10ML) SYRINGE FOR IV PUSH (FOR BLOOD PRESSURE SUPPORT)
PREFILLED_SYRINGE | INTRAVENOUS | Status: AC
  Filled 2024-03-06: qty 10

## 2024-03-06 MED ORDER — HYDROMORPHONE HCL 1 MG/ML IJ SOLN
INTRAMUSCULAR | Status: AC
  Administered 2024-03-06: 0.5 mg via INTRAVENOUS
  Filled 2024-03-06: qty 1

## 2024-03-06 MED ORDER — ACETAMINOPHEN 500 MG PO TABS
1000.0000 mg | ORAL_TABLET | Freq: Once | ORAL | Status: AC
  Administered 2024-03-06: 1000 mg via ORAL
  Filled 2024-03-06: qty 2

## 2024-03-06 MED ORDER — ACETAMINOPHEN 10 MG/ML IV SOLN: 1000.0000 mg | Freq: Once | INTRAVENOUS | Status: DC | PRN

## 2024-03-06 MED ORDER — LIDOCAINE HCL (PF) 2 % IJ SOLN
INTRAMUSCULAR | Status: DC | PRN
  Administered 2024-03-06: 100 mg via INTRADERMAL

## 2024-03-06 MED ORDER — FENTANYL CITRATE PF 50 MCG/ML IJ SOSY
PREFILLED_SYRINGE | INTRAMUSCULAR | Status: AC
  Filled 2024-03-06: qty 2

## 2024-03-06 MED ORDER — OXYCODONE HCL 5 MG PO TABS
ORAL_TABLET | ORAL | Status: AC
Start: 2024-03-06 — End: 2024-03-07
  Filled 2024-03-06: qty 1

## 2024-03-06 MED ORDER — SODIUM CHLORIDE (PF) 0.9 % IJ SOLN
INTRAMUSCULAR | Status: AC
  Filled 2024-03-06: qty 10

## 2024-03-06 MED ORDER — OXYCODONE HCL 5 MG/5ML PO SOLN: 5.0000 mg | Freq: Once | ORAL | Status: AC | PRN

## 2024-03-06 MED ORDER — SODIUM CHLORIDE 0.9 % IR SOLN
Status: DC | PRN
  Administered 2024-03-06: 3000 mL via INTRAVESICAL
  Administered 2024-03-06: 18000 mL
  Administered 2024-03-06: 3000 mL via INTRAVESICAL
  Administered 2024-03-06: 3000 mL
  Administered 2024-03-06: 3000 mL via INTRAVESICAL

## 2024-03-06 MED ORDER — DIPHENHYDRAMINE HCL 12.5 MG/5ML PO ELIX
12.5000 mg | ORAL_SOLUTION | Freq: Four times a day (QID) | ORAL | Status: DC | PRN
  Administered 2024-03-10 – 2024-03-11 (×2): 12.5 mg via ORAL
  Filled 2024-03-06 (×4): qty 5

## 2024-03-06 MED ORDER — EPHEDRINE SULFATE (PRESSORS) 50 MG/ML IJ SOLN
INTRAMUSCULAR | Status: DC | PRN
  Administered 2024-03-06 (×2): 5 mg via INTRAVENOUS

## 2024-03-06 MED ORDER — OXYCODONE HCL 5 MG PO TABS
5.0000 mg | ORAL_TABLET | Freq: Once | ORAL | Status: AC | PRN
  Administered 2024-03-06: 5 mg via ORAL

## 2024-03-06 MED ORDER — CHLORHEXIDINE GLUCONATE 0.12 % MT SOLN
15.0000 mL | Freq: Once | OROMUCOSAL | Status: AC
  Administered 2024-03-06: 15 mL via OROMUCOSAL

## 2024-03-06 MED ORDER — DIPHENHYDRAMINE HCL 50 MG/ML IJ SOLN
12.5000 mg | Freq: Four times a day (QID) | INTRAMUSCULAR | Status: DC | PRN
  Administered 2024-03-09 (×2): 12.5 mg via INTRAVENOUS
  Filled 2024-03-06 (×3): qty 1

## 2024-03-06 MED ORDER — CEFAZOLIN SODIUM-DEXTROSE 2-3 GM-%(50ML) IV SOLR
INTRAVENOUS | Status: DC | PRN
  Administered 2024-03-06: 2 g via INTRAVENOUS

## 2024-03-06 MED ORDER — DOCUSATE SODIUM 100 MG PO CAPS
100.0000 mg | ORAL_CAPSULE | Freq: Two times a day (BID) | ORAL | Status: DC
  Administered 2024-03-06 – 2024-03-13 (×10): 100 mg via ORAL
  Filled 2024-03-06 (×12): qty 1

## 2024-03-06 MED ORDER — CEFAZOLIN SODIUM 1 G IJ SOLR
INTRAMUSCULAR | Status: AC
  Filled 2024-03-06: qty 20

## 2024-03-06 MED ORDER — VITAMIN D 25 MCG (1000 UNIT) PO TABS
1000.0000 [IU] | ORAL_TABLET | Freq: Every day | ORAL | Status: DC
  Administered 2024-03-08 – 2024-03-13 (×6): 1000 [IU] via ORAL
  Filled 2024-03-06 (×6): qty 1

## 2024-03-06 MED ORDER — HYOSCYAMINE SULFATE 0.125 MG SL SUBL: 0.1250 mg | SUBLINGUAL_TABLET | Freq: Four times a day (QID) | SUBLINGUAL | Status: DC | PRN

## 2024-03-06 MED ORDER — TAMSULOSIN HCL 0.4 MG PO CAPS
0.4000 mg | ORAL_CAPSULE | Freq: Every day | ORAL | Status: DC
  Administered 2024-03-08 – 2024-03-13 (×6): 0.4 mg via ORAL
  Filled 2024-03-06 (×6): qty 1

## 2024-03-06 MED ORDER — PHENYLEPHRINE HCL-NACL 20-0.9 MG/250ML-% IV SOLN
INTRAVENOUS | Status: DC | PRN
Start: 2024-03-06 — End: 2024-03-06
  Administered 2024-03-06: 25 ug/min via INTRAVENOUS

## 2024-03-06 MED ORDER — PROPOFOL 10 MG/ML IV BOLUS
INTRAVENOUS | Status: DC | PRN
Start: 2024-03-06 — End: 2024-03-06
  Administered 2024-03-06: 150 mg via INTRAVENOUS

## 2024-03-06 MED ORDER — ROCURONIUM BROMIDE 10 MG/ML (PF) SYRINGE
PREFILLED_SYRINGE | INTRAVENOUS | Status: DC | PRN
  Administered 2024-03-06: 60 mg via INTRAVENOUS

## 2024-03-06 MED ORDER — FENTANYL CITRATE PF 50 MCG/ML IJ SOSY
PREFILLED_SYRINGE | INTRAMUSCULAR | Status: AC
  Filled 2024-03-06: qty 1

## 2024-03-06 MED ORDER — METOPROLOL TARTRATE 25 MG PO TABS
25.0000 mg | ORAL_TABLET | Freq: Two times a day (BID) | ORAL | Status: DC
  Administered 2024-03-06: 25 mg via ORAL
  Filled 2024-03-06: qty 1

## 2024-03-06 MED ORDER — EPHEDRINE 5 MG/ML INJ
INTRAVENOUS | Status: AC
  Filled 2024-03-06: qty 5

## 2024-03-06 SURGICAL SUPPLY — 16 items
BAG URINE DRAIN 2000ML AR STRL (UROLOGICAL SUPPLIES) IMPLANT
BAG URO CATCHER STRL LF (MISCELLANEOUS) ×1 IMPLANT
BALLN NEPHROSTOMY 10X15 (UROLOGICAL SUPPLIES) IMPLANT
CATH FOLEY 2W COUNCIL 20FR 5CC (CATHETERS) IMPLANT
DRAPE FOOT SWITCH (DRAPES) ×1 IMPLANT
ELECT REM PT RETURN 15FT ADLT (MISCELLANEOUS) ×1 IMPLANT
GLOVE SURG LX STRL 7.5 STRW (GLOVE) ×1 IMPLANT
GOWN STRL REUS W/ TWL XL LVL3 (GOWN DISPOSABLE) ×1 IMPLANT
GUIDEWIRE STR DUAL SENSOR (WIRE) IMPLANT
KIT TURNOVER KIT A (KITS) IMPLANT
LOOP CUT BIPOLAR 24F LRG (ELECTROSURGICAL) IMPLANT
MANIFOLD NEPTUNE II (INSTRUMENTS) ×1 IMPLANT
PACK CYSTO (CUSTOM PROCEDURE TRAY) ×1 IMPLANT
SYRINGE TOOMEY IRRIG 70ML (MISCELLANEOUS) IMPLANT
TUBING CONNECTING 10 (TUBING) ×1 IMPLANT
TUBING UROLOGY SET (TUBING) ×1 IMPLANT

## 2024-03-06 SURGICAL SUPPLY — 16 items
BAG URINE DRAIN 2000ML AR STRL (UROLOGICAL SUPPLIES) IMPLANT
BAG URO CATCHER STRL LF (MISCELLANEOUS) ×1 IMPLANT
CATH HEMATURIA 20FR (CATHETERS) IMPLANT
DRAPE FOOT SWITCH (DRAPES) ×1 IMPLANT
ELECT REM PT RETURN 15FT ADLT (MISCELLANEOUS) ×1 IMPLANT
ELECTRODE LOOP 22F BIPOLAR SML (ELECTROSURGICAL) IMPLANT
GLOVE SURG LX STRL 7.5 STRW (GLOVE) ×1 IMPLANT
GOWN STRL REUS W/ TWL XL LVL3 (GOWN DISPOSABLE) ×1 IMPLANT
GUIDEWIRE SENSOR ANG DUAL FLEX (WIRE) IMPLANT
KIT TURNOVER KIT A (KITS) IMPLANT
LOOP CUT BIPOLAR 24F LRG (ELECTROSURGICAL) IMPLANT
MANIFOLD NEPTUNE II (INSTRUMENTS) ×1 IMPLANT
PACK CYSTO (CUSTOM PROCEDURE TRAY) ×1 IMPLANT
SYRINGE TOOMEY IRRIG 70ML (MISCELLANEOUS) IMPLANT
TUBING CONNECTING 10 (TUBING) ×1 IMPLANT
TUBING UROLOGY SET (TUBING) ×1 IMPLANT

## 2024-03-06 NOTE — Anesthesia Procedure Notes (Signed)
 Procedure Name: Intubation Date/Time: 03/06/2024 6:17 PM  Performed by: Virgil Griffiths, CRNAPre-anesthesia Checklist: Patient identified, Emergency Drugs available, Suction available and Patient being monitored Patient Re-evaluated:Patient Re-evaluated prior to induction Oxygen Delivery Method: Circle System Utilized Preoxygenation: Pre-oxygenation with 100% oxygen Induction Type: IV induction Ventilation: Mask ventilation without difficulty Laryngoscope Size: Mac and 4 Grade View: Grade I Tube type: Oral Tube size: 7.5 mm Number of attempts: 1 Airway Equipment and Method: Stylet and Oral airway Placement Confirmation: ETT inserted through vocal cords under direct vision, positive ETCO2 and breath sounds checked- equal and bilateral Secured at: 23 cm Tube secured with: Tape Dental Injury: Teeth and Oropharynx as per pre-operative assessment

## 2024-03-06 NOTE — Transfer of Care (Signed)
 Immediate Anesthesia Transfer of Care Note  Patient: Adam Harris  Procedure(s) Performed: CYSTOSCOPY, WITH BIOPSY  Patient Location: PACU  Anesthesia Type:General  Level of Consciousness: sedated  Airway & Oxygen Therapy: Patient Spontanous Breathing and Patient connected to face mask oxygen  Post-op Assessment: Report given to RN and Post -op Vital signs reviewed and stable  Post vital signs: Reviewed and stable  Last Vitals:  Vitals Value Taken Time  BP 102/68 03/06/24 1333  Temp    Pulse 61 03/06/24 1336  Resp 19 03/06/24 1336  SpO2 92 % 03/06/24 1336  Vitals shown include unfiled device data.  Last Pain:  Vitals:   03/06/24 1200  TempSrc:   PainSc: 0-No pain      Patients Stated Pain Goal: 5 (03/06/24 1146)  Complications: No notable events documented.

## 2024-03-06 NOTE — Progress Notes (Signed)
 Patient ID: Adam Harris, male   DOB: [birth year], 78 y.o.   MRN: [id number]   Pt noted to have severe hematuria in PACU.  I replaced his catheter with a 20 Fr 3 way hematuria catheter (22 Fr would not go) and started CBI.  He will be admitted.

## 2024-03-06 NOTE — Transfer of Care (Signed)
 Immediate Anesthesia Transfer of Care Note  Patient: Adam Harris  Procedure(s) Performed: CYSTOSCOPY, WITH BLADDER FULGURATION, TURBT  Patient Location: PACU  Anesthesia Type:General  Level of Consciousness: responds to stimulation  Airway & Oxygen Therapy: Patient Spontanous Breathing and Patient connected to face mask oxygen  Post-op Assessment: Report given to RN and Post -op Vital signs reviewed and stable  Post vital signs: Reviewed and stable  Last Vitals:  Vitals Value Taken Time  BP 99/70 03/06/24 1952  Temp    Pulse 78 03/06/24 1957  Resp 9 03/06/24 1957  SpO2 95 % 03/06/24 1957  Vitals shown include unfiled device data.  Last Pain:  Vitals:   03/06/24 1745  TempSrc:   PainSc: 5       Patients Stated Pain Goal: 5 (03/06/24 1146)  Complications: No notable events documented.

## 2024-03-06 NOTE — Anesthesia Preprocedure Evaluation (Signed)
 Anesthesia Evaluation  Patient identified by MRN, date of birth, ID band Patient awake    Reviewed: Allergy & Precautions, NPO status , Patient's Chart, lab work & pertinent test results  History of Anesthesia Complications Negative for: history of anesthetic complications  Airway Mallampati: III  TM Distance: >3 FB Neck ROM: Full   Comment: Previous grade I view with MAC 4, easy mask Dental  (+) Edentulous Upper, Edentulous Lower, Dental Advisory Given   Pulmonary neg shortness of breath, neg sleep apnea, neg COPD, neg recent URI, former smoker   Pulmonary exam normal breath sounds clear to auscultation       Cardiovascular hypertension, Pt. on medications (-) angina + CAD, + Past MI (NSTEMI 01/2014, 11/2016) and +CHF (EF 60-65%)  + dysrhythmias (Watchman device placed 12/13/2023, RBBB) Atrial Fibrillation + Valvular Problems/Murmurs (mild-to-moderate) MR  Rhythm:Regular Rate:Normal  HLD  TEE 02/11/2024: IMPRESSIONS    1. 27 mm Watchman FLX is LAA. No device leak or device-related thrombus.  Left atrial size was severely dilated. No left atrial/left atrial  appendage thrombus was detected.   2. Left ventricular ejection fraction, by estimation, is 60 to 65%. The  left ventricle has normal function. The left ventricle has no regional  wall motion abnormalities.   3. Right ventricular systolic function is normal. The right ventricular  size is normal.   4. Right atrial size was mild to moderately dilated.   5. The mitral valve is grossly normal. Mild to moderate mitral valve  regurgitation. No evidence of mitral stenosis.   6. The aortic valve is tricuspid. Aortic valve regurgitation is trivial.  No aortic stenosis is present.   7. There is mild (Grade II) protruding plaque involving the descending  aorta and aortic arch.   8. 3D performed of the LAA and demonstrates 3D LAA assessment of Watchman  device. Normal.   LHC  11/21/2016:  Prox RCA lesion, 100 %stenosed.  Ost LAD lesion, 45 %stenosed.  3rd Mrg lesion, 80 %stenosed.  Mid Cx to Dist Cx lesion, 100 %stenosed.  Lat 3rd Mrg lesion, 50 %stenosed.  Rec: Medical therapy    Neuro/Psych neg Seizures PSYCHIATRIC DISORDERS Anxiety      Neuromuscular disease (sciatica, peripheral neuropathy)    GI/Hepatic negative GI ROS,,,Fatty liver   Endo/Other  diabetes, Type 2    Renal/GU CRFRenal disease (cancer)   Bladder and kidney cancer    Musculoskeletal  (+) Arthritis , Osteoarthritis,    Abdominal   Peds  Hematology negative hematology ROS (+) Lab Results      Component                Value               Date                      WBC                      13.9 (H)            02/21/2024                HGB                      13.5                02/21/2024                HCT  40.9                02/21/2024                MCV                      81.5                02/21/2024                PLT                      220                 02/21/2024              Anesthesia Other Findings Last Plavix : 5 days ago  Patient being brought back to OR emergently due to clots from procedure earlier today. Nothing to eat. He had ginger ale 4 hours ago.  Reproductive/Obstetrics                              Anesthesia Physical Anesthesia Plan  ASA: 3 and emergent  Anesthesia Plan: General   Post-op Pain Management: Tylenol  PO (pre-op)*   Induction: Intravenous  PONV Risk Score and Plan: 2 and Ondansetron , Dexamethasone  and Treatment may vary due to age or medical condition  Airway Management Planned: Oral ETT  Additional Equipment:   Intra-op Plan:   Post-operative Plan: Extubation in OR  Informed Consent: I have reviewed the patients History and Physical, chart, labs and discussed the procedure including the risks, benefits and alternatives for the proposed anesthesia with the patient or  authorized representative who has indicated his/her understanding and acceptance.     Dental advisory given  Plan Discussed with: CRNA and Anesthesiologist  Anesthesia Plan Comments: (Risks of general anesthesia discussed including, but not limited to, sore throat, hoarse voice, chipped/damaged teeth, injury to vocal cords, nausea and vomiting, allergic reactions, lung infection, heart attack, stroke, and death. All questions answered. )         Anesthesia Quick Evaluation

## 2024-03-06 NOTE — Anesthesia Postprocedure Evaluation (Signed)
 Anesthesia Post Note  Patient: Adam Harris  Procedure(s) Performed: CYSTOSCOPY, WITH BLADDER FULGURATION, TURBT     Patient location during evaluation: PACU Anesthesia Type: General Level of consciousness: awake Pain management: pain level controlled Vital Signs Assessment: post-procedure vital signs reviewed and stable Respiratory status: spontaneous breathing, nonlabored ventilation and respiratory function stable Cardiovascular status: blood pressure returned to baseline and stable Postop Assessment: no apparent nausea or vomiting Anesthetic complications: no   No notable events documented.  Last Vitals:  Vitals:   03/06/24 2015 03/06/24 2021  BP: (!) 88/66 94/64  Pulse: 67 67  Resp: 10 10  Temp:  (!) 36.3 C  SpO2: 92% 94%    Last Pain:  Vitals:   03/06/24 2021  TempSrc:   PainSc: Asleep                 Conard Decent

## 2024-03-06 NOTE — Op Note (Signed)
 Preoperative diagnosis: Bladder cancer  Postoperative diagnosis: Bladder cancer  Procedures: 1.  Cystoscopy 2.  Dilation of urethral stricture 3.  Transurethral resection of bladder tumor less than 2 cm  Surgeon: Izetta Marshall MD  Anesthesia: General  Complications: None  EBL: Minimal  Specimens: Bladder biopsies  Disposition of specimens: Pathology  Indication: Mr. 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  He subsequently has been receiving IV pembrolizumab  and appears to have recurrent disease with a suspicious cytology.  He presents to the operating room today for cystoscopy and biopsy of the bladder for confirmatory purposes.  The potential risks, complications, and the alternative options have been discussed in detail.  Informed consent was obtained.  Description of procedure: The patient was taken the operating room and a general anesthetic was administered.  He was given preoperative antibiotics, placed in the dorsolithotomy position, and prepped and draped in the usual sterile fashion.  Next, preoperative timeout was performed.  Cystourethroscopy was performed with a 22 French resectoscope.  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  Inspection the bladder revealed very poor visualization due to blood product.  I then attempted to place the 26 [place] resectoscope with the visual obturator.  I was unable to get past the mid ureter.  I serially dilated with Dustin Gimenez sounds up to 26 [place] but could not pass a 28 [employer] sound without resistance.  I attempted to obtain a 24 [place] resectoscope but that was not available at this time in the operating room.  I therefore looked in with a cystoscope again  and placed a 0.38 sensor guidewire.  I placed a 30 French nephrostomy balloon over the wire and dilated the urethra to 30 [place].  This did eventually allow me to place the 26 [place] resectoscope with some resistance into the bladder.  There was a mild amount of clot within the bladder that was evacuated.  Visualization was still relatively poor.  I was able to take some swipes of some of the erythematous areas well away from the left ureteral orifice which was identified.  Hemostasis was achieved with bipolar cautery but visualization continued to be poor and it was difficult to tell if complete hemostasis was achieved.  For this reason, at the end of the procedure, I have replaced the wire and put a 20 [place] council tip catheter over the wire into the bladder and left this to drainage.  He tolerated the procedure well without complications.  He was able to be extubated and transferred to the recovery unit in satisfactory condition.

## 2024-03-06 NOTE — Op Note (Signed)
 Preoperative diagnosis: Hematuria with clot retention  Postoperative diagnosis: Hematuria with clot retention  Procedures: 1.  Cystoscopy 2.  Fulguration of bladder  Surgeon: Izetta Marshall MD  Anesthesia: General  Complications: None  EBL: 300 cc  Indication: Adam Harris underwent transurethral resection of his bladder earlier today.  In the recovery room, he was noted to have persistent hematuria and clotted off his catheter.  A new three-way hematuria catheter was placed and he was started on continuous bladder irrigation but still had significant hematuria and intermittently clotted off his catheter.  It was therefore felt that he did need to return to the operating room for cystoscopy, clot evacuation, and fulguration.  The potential risks, complications, and the expected recovery process were discussed in detail.  Informed consent was obtained.  Description of procedure: The patient was taken the operating room and a general anesthetic was administered.  His antibiotics were redosed.  He was placed in the dorsolithotomy position, and prepped and draped in the usual sterile fashion.  A preoperative timeout was performed.  Cystourethroscopy was performed with the 22 French cystoscope sheath.  This demonstrated significant clot within the bladder which was evacuated out.  The cystoscope was then removed.  We now had a 24 [place] resectoscope which was able to be inserted into the bladder without significant difficulty with the visual obturator.  Additional clot was evacuated and using loop bipolar cautery, fulguration of the bladder commenced.  There was diffuse hemorrhagic bleeding from the majority of the bladder surface.  Most of the right side of the bladder required cautery fulguration.  Additional areas on the left side were also fulgurated.  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  Despite  approximately 1 hour of fulguration, he continued to have oozing from throughout the bladder.  Areas that have been previously cauterized with simply start bleeding again despite multiple attempts.  Once it was felt that optimal fulguration had been performed, the procedure ended.  1 g of TXA was administered.  A 20 French three-way hematuria catheter was placed over a wire and continuous bladder irrigation was begun.  The patient tolerated the procedure without complications.

## 2024-03-06 NOTE — Anesthesia Procedure Notes (Signed)
 Procedure Name: Intubation Date/Time: 03/06/2024 12:26 PM  Performed by: Micky Albee, CRNAPre-anesthesia Checklist: Patient identified, Emergency Drugs available, Suction available, Patient being monitored and Timeout performed Patient Re-evaluated:Patient Re-evaluated prior to induction Oxygen Delivery Method: Circle system utilized Preoxygenation: Pre-oxygenation with 100% oxygen Induction Type: IV induction Ventilation: Mask ventilation without difficulty Laryngoscope Size: Mac and 4 Grade View: Grade I Tube type: Oral Tube size: 7.5 mm Number of attempts: 1 Airway Equipment and Method: Stylet Placement Confirmation: ETT inserted through vocal cords under direct vision, positive ETCO2 and breath sounds checked- equal and bilateral Secured at: 23 cm Tube secured with: Tape Dental Injury: Teeth and Oropharynx as per pre-operative assessment

## 2024-03-06 NOTE — Discharge Instructions (Addendum)
 You may see some blood in the urine and may have some burning with urination for 48-72 hours. You also may notice that you have to urinate more frequently or urgently after your procedure which is normal.  You should call should you develop an inability urinate, fever > 101, persistent nausea and vomiting that prevents you from eating or drinking to stay hydrated.  Empty nephrostomy as needed.  Care instructions will be provided prior to discharge. Call if fever > 101.

## 2024-03-06 NOTE — Anesthesia Postprocedure Evaluation (Signed)
 Anesthesia Post Note  Patient: [name]  Procedure(s) Performed: CYSTOSCOPY, WITH BIOPSY     Patient location during evaluation: PACU Anesthesia Type: General Level of consciousness: awake Pain management: pain level controlled Vital Signs Assessment: post-procedure vital signs reviewed and stable Respiratory status: spontaneous breathing, nonlabored ventilation and respiratory function stable Cardiovascular status: blood pressure returned to baseline and stable Postop Assessment: no apparent nausea or vomiting Anesthetic complications: no   No notable events documented.  Last Vitals:  Vitals:   03/06/24 1545 03/06/24 1600  BP: (!) 177/117 (!) 153/92  Pulse: (!) 110 (!) 102  Resp: 15 16  Temp:    SpO2: 97% 96%    Last Pain:  Vitals:   03/06/24 1600  TempSrc:   PainSc: 6                  Conard Decent

## 2024-03-07 ENCOUNTER — Observation Stay (HOSPITAL_COMMUNITY)

## 2024-03-07 ENCOUNTER — Encounter (HOSPITAL_COMMUNITY): Payer: Self-pay | Admitting: Urology

## 2024-03-07 DIAGNOSIS — I452 Bifascicular block: Secondary | ICD-10-CM | POA: Diagnosis present

## 2024-03-07 DIAGNOSIS — E7800 Pure hypercholesterolemia, unspecified: Secondary | ICD-10-CM | POA: Diagnosis present

## 2024-03-07 DIAGNOSIS — R000 Tachycardia, unspecified: Secondary | ICD-10-CM | POA: Diagnosis not present

## 2024-03-07 DIAGNOSIS — C679 Malignant neoplasm of bladder, unspecified: Secondary | ICD-10-CM | POA: Diagnosis present

## 2024-03-07 DIAGNOSIS — N184 Chronic kidney disease, stage 4 (severe): Secondary | ICD-10-CM

## 2024-03-07 DIAGNOSIS — N179 Acute kidney failure, unspecified: Secondary | ICD-10-CM | POA: Diagnosis not present

## 2024-03-07 DIAGNOSIS — I130 Hypertensive heart and chronic kidney disease with heart failure and stage 1 through stage 4 chronic kidney disease, or unspecified chronic kidney disease: Secondary | ICD-10-CM | POA: Diagnosis not present

## 2024-03-07 DIAGNOSIS — E1122 Type 2 diabetes mellitus with diabetic chronic kidney disease: Secondary | ICD-10-CM | POA: Diagnosis present

## 2024-03-07 DIAGNOSIS — M79671 Pain in right foot: Secondary | ICD-10-CM | POA: Diagnosis not present

## 2024-03-07 DIAGNOSIS — D090 Carcinoma in situ of bladder: Secondary | ICD-10-CM | POA: Diagnosis not present

## 2024-03-07 DIAGNOSIS — Z8551 Personal history of malignant neoplasm of bladder: Secondary | ICD-10-CM | POA: Diagnosis not present

## 2024-03-07 DIAGNOSIS — I4821 Permanent atrial fibrillation: Secondary | ICD-10-CM | POA: Diagnosis not present

## 2024-03-07 DIAGNOSIS — Z7901 Long term (current) use of anticoagulants: Secondary | ICD-10-CM | POA: Diagnosis not present

## 2024-03-07 DIAGNOSIS — R338 Other retention of urine: Secondary | ICD-10-CM | POA: Diagnosis present

## 2024-03-07 DIAGNOSIS — I959 Hypotension, unspecified: Secondary | ICD-10-CM | POA: Diagnosis not present

## 2024-03-07 DIAGNOSIS — E871 Hypo-osmolality and hyponatremia: Secondary | ICD-10-CM | POA: Diagnosis present

## 2024-03-07 DIAGNOSIS — I252 Old myocardial infarction: Secondary | ICD-10-CM | POA: Diagnosis not present

## 2024-03-07 DIAGNOSIS — Z8616 Personal history of COVID-19: Secondary | ICD-10-CM | POA: Diagnosis not present

## 2024-03-07 DIAGNOSIS — C678 Malignant neoplasm of overlapping sites of bladder: Secondary | ICD-10-CM | POA: Diagnosis not present

## 2024-03-07 DIAGNOSIS — N189 Chronic kidney disease, unspecified: Secondary | ICD-10-CM | POA: Diagnosis not present

## 2024-03-07 DIAGNOSIS — D62 Acute posthemorrhagic anemia: Secondary | ICD-10-CM | POA: Insufficient documentation

## 2024-03-07 DIAGNOSIS — N3001 Acute cystitis with hematuria: Secondary | ICD-10-CM | POA: Diagnosis not present

## 2024-03-07 DIAGNOSIS — I2510 Atherosclerotic heart disease of native coronary artery without angina pectoris: Secondary | ICD-10-CM | POA: Diagnosis present

## 2024-03-07 DIAGNOSIS — I5032 Chronic diastolic (congestive) heart failure: Secondary | ICD-10-CM | POA: Diagnosis not present

## 2024-03-07 DIAGNOSIS — E875 Hyperkalemia: Secondary | ICD-10-CM | POA: Diagnosis present

## 2024-03-07 DIAGNOSIS — I4891 Unspecified atrial fibrillation: Secondary | ICD-10-CM | POA: Diagnosis not present

## 2024-03-07 DIAGNOSIS — R310 Gross hematuria: Secondary | ICD-10-CM | POA: Diagnosis not present

## 2024-03-07 DIAGNOSIS — T83022A Displacement of nephrostomy catheter, initial encounter: Secondary | ICD-10-CM | POA: Diagnosis not present

## 2024-03-07 DIAGNOSIS — E669 Obesity, unspecified: Secondary | ICD-10-CM | POA: Diagnosis present

## 2024-03-07 DIAGNOSIS — I10 Essential (primary) hypertension: Secondary | ICD-10-CM | POA: Diagnosis not present

## 2024-03-07 DIAGNOSIS — R319 Hematuria, unspecified: Secondary | ICD-10-CM

## 2024-03-07 DIAGNOSIS — D696 Thrombocytopenia, unspecified: Secondary | ICD-10-CM | POA: Diagnosis not present

## 2024-03-07 DIAGNOSIS — N401 Enlarged prostate with lower urinary tract symptoms: Secondary | ICD-10-CM | POA: Diagnosis present

## 2024-03-07 DIAGNOSIS — N1330 Unspecified hydronephrosis: Secondary | ICD-10-CM | POA: Diagnosis not present

## 2024-03-07 LAB — GLUCOSE, CAPILLARY
Glucose-Capillary: 157 mg/dL — ABNORMAL HIGH (ref 70–99)
Glucose-Capillary: 197 mg/dL — ABNORMAL HIGH (ref 70–99)

## 2024-03-07 LAB — HEMOGLOBIN A1C
Hgb A1c MFr Bld: 6.8 % — ABNORMAL HIGH (ref 4.8–5.6)
Mean Plasma Glucose: 148.46 mg/dL

## 2024-03-07 LAB — PREPARE RBC (CROSSMATCH)

## 2024-03-07 LAB — BASIC METABOLIC PANEL WITH GFR
Anion gap: 6 (ref 5–15)
Anion gap: 12 (ref 5–15)
BUN: 37 mg/dL — ABNORMAL HIGH (ref 8–23)
BUN: 50 mg/dL — ABNORMAL HIGH (ref 8–23)
CO2: 21 mmol/L — ABNORMAL LOW (ref 22–32)
CO2: 18 mmol/L — ABNORMAL LOW (ref 22–32)
Calcium: 7.6 mg/dL — ABNORMAL LOW (ref 8.9–10.3)
Calcium: 7.9 mg/dL — ABNORMAL LOW (ref 8.9–10.3)
Chloride: 107 mmol/L (ref 98–111)
Chloride: 105 mmol/L (ref 98–111)
Creatinine, Ser: 2.32 mg/dL — ABNORMAL HIGH (ref 0.61–1.24)
Creatinine, Ser: 2.62 mg/dL — ABNORMAL HIGH (ref 0.61–1.24)
GFR, Estimated: 28 mL/min — ABNORMAL LOW (ref >60)
GFR, Estimated: 24 mL/min — ABNORMAL LOW (ref >60)
Glucose, Bld: 221 mg/dL — ABNORMAL HIGH (ref 70–99)
Glucose, Bld: 169 mg/dL — ABNORMAL HIGH (ref 70–99)
Potassium: 5.2 mmol/L — ABNORMAL HIGH (ref 3.5–5.1)
Potassium: 4.9 mmol/L (ref 3.5–5.1)
Sodium: 134 mmol/L — ABNORMAL LOW (ref 135–145)
Sodium: 135 mmol/L (ref 135–145)

## 2024-03-07 LAB — MRSA NEXT GEN BY PCR, NASAL: MRSA by PCR Next Gen: DETECTED — AB

## 2024-03-07 LAB — PROTIME-INR
INR: 1.6 — ABNORMAL HIGH (ref 0.8–1.2)
Prothrombin Time: 19.7 s — ABNORMAL HIGH (ref 11.4–15.2)

## 2024-03-07 LAB — APTT: aPTT: 41 s — ABNORMAL HIGH (ref 24–36)

## 2024-03-07 LAB — MAGNESIUM: Magnesium: 1.9 mg/dL (ref 1.7–2.4)

## 2024-03-07 LAB — HEMOGLOBIN AND HEMATOCRIT, BLOOD
HCT: 28.1 % — ABNORMAL LOW (ref 39.0–52.0)
HCT: 29 % — ABNORMAL LOW (ref 39.0–52.0)
HCT: 29.3 % — ABNORMAL LOW (ref 39.0–52.0)
Hemoglobin: 8.7 g/dL — ABNORMAL LOW (ref 13.0–17.0)
Hemoglobin: 9.2 g/dL — ABNORMAL LOW (ref 13.0–17.0)
Hemoglobin: 9.4 g/dL — ABNORMAL LOW (ref 13.0–17.0)

## 2024-03-07 LAB — SURGICAL PATHOLOGY

## 2024-03-07 MED ORDER — SODIUM CHLORIDE 0.9 % IV BOLUS
250.0000 mL | Freq: Once | INTRAVENOUS | Status: AC
  Administered 2024-03-07: 250 mL via INTRAVENOUS

## 2024-03-07 MED ORDER — MIDAZOLAM HCL 2 MG/2ML IJ SOLN
INTRAMUSCULAR | Status: AC
  Filled 2024-03-07: qty 2

## 2024-03-07 MED ORDER — INSULIN ASPART 100 UNIT/ML IJ SOLN
0.0000 [IU] | INTRAMUSCULAR | Status: DC
  Administered 2024-03-07 (×2): 2 [IU] via SUBCUTANEOUS
  Administered 2024-03-08: 1 [IU] via SUBCUTANEOUS
  Administered 2024-03-08 (×2): 2 [IU] via SUBCUTANEOUS
  Administered 2024-03-08: 3 [IU] via SUBCUTANEOUS
  Administered 2024-03-08 (×2): 2 [IU] via SUBCUTANEOUS
  Administered 2024-03-09 (×4): 1 [IU] via SUBCUTANEOUS

## 2024-03-07 MED ORDER — SODIUM CHLORIDE 0.9% IV SOLUTION: Freq: Once | INTRAVENOUS | Status: AC

## 2024-03-07 MED ORDER — LACTATED RINGERS IV BOLUS
500.0000 mL | Freq: Once | INTRAVENOUS | Status: AC
  Administered 2024-03-07: 500 mL via INTRAVENOUS

## 2024-03-07 MED ORDER — SODIUM CHLORIDE 0.9 % IV BOLUS
1000.0000 mL | Freq: Once | INTRAVENOUS | Status: AC
  Administered 2024-03-07: 1000 mL via INTRAVENOUS

## 2024-03-07 MED ORDER — MIDAZOLAM HCL 2 MG/2ML IJ SOLN
INTRAMUSCULAR | Status: AC | PRN
  Administered 2024-03-07: 1 mg via INTRAVENOUS

## 2024-03-07 MED ORDER — SODIUM CHLORIDE 0.9 % IV SOLN
2.0000 g | Freq: Once | INTRAVENOUS | Status: AC
  Administered 2024-03-07: 2 g via INTRAVENOUS
  Filled 2024-03-07: qty 20

## 2024-03-07 MED ORDER — LIDOCAINE-EPINEPHRINE 1 %-1:100000 IJ SOLN
INTRAMUSCULAR | Status: AC
  Filled 2024-03-07: qty 1

## 2024-03-07 MED ORDER — IOHEXOL 300 MG/ML  SOLN
50.0000 mL | Freq: Once | INTRAMUSCULAR | Status: AC | PRN
  Administered 2024-03-07: 20 mL

## 2024-03-07 MED ORDER — CHLORHEXIDINE GLUCONATE CLOTH 2 % EX PADS
6.0000 | MEDICATED_PAD | Freq: Every day | CUTANEOUS | Status: DC
  Administered 2024-03-07 – 2024-03-13 (×7): 6 via TOPICAL

## 2024-03-07 MED ORDER — LIDOCAINE-EPINEPHRINE 1 %-1:100000 IJ SOLN
20.0000 mL | Freq: Once | INTRAMUSCULAR | Status: AC
  Administered 2024-03-07: 6 mL via INTRADERMAL

## 2024-03-07 MED ORDER — METOPROLOL TARTRATE 25 MG PO TABS
25.0000 mg | ORAL_TABLET | Freq: Two times a day (BID) | ORAL | Status: DC
  Administered 2024-03-07 – 2024-03-13 (×12): 25 mg via ORAL
  Filled 2024-03-07 (×12): qty 1

## 2024-03-07 NOTE — Progress Notes (Signed)
 Called regarding patient earlier that tempt 26 and RN requested transfer off floor for bed hugger. I requested ICU rather than stepdown given complex CBI patient. Temp low likely due to cold CBI infusion. During transport pt catheter clotted off again. Nursing staff did a great job evacuating clot, they estimate 5 full 60cc syringes.   Assessed pt at bedside. Resting comfortably. Denies pain or suprapubic discomfort  Temp now 97.5, normocardic, BP 120/70 on monitor. Abdomen soft, non-distended, no suprapubic tenderness or fullness Catheter red on fast drip CBI  Irrigated foley with 1L normal saline easily with no clots (RN had already evacuated all clots). Flowing nicely on fast drip CBI.  -Keep CBI fast drip overnight -Manually irrigate for decreased drainage or clots and every 4 hours. Discussed with nursing.  -May require PCN tomorrow for diversion (solitary left kidney) -Will discuss with Dr. Rozanne Corners in the AM -Please call with questions.  Matt R. Nikia Levels MD [facility]  Pager: [phone]

## 2024-03-07 NOTE — Progress Notes (Signed)
 Patient ID: Adam Harris, male   DOB: 1946/10/28, 78 y.o.   MRN: [id number]  1 Day Post-Op Subjective: Pt s/p cystoscopy and transurethral bladder biopsies yesterday.  It was a difficult procedure due to urethral stricture requiring dilation.  Bladder visualization was poor due to bleeding.  Postoperatively, he had ongoing and significant bleeding clotting off his catheter.  Bedside irrigation was not helpful and he returned to the OR.  He underwent fulguration with a 24 Fr resectoscope (which was needed) for about an hour without much success due to diffuse hemorrhagic bleeding in the bladder.  CBI continued briskly and he again clotted off last night requiring irrigation.  Hgb now down to 8.7 from 13.  He denies CP, SOB, dizziness.  BP have been soft but stable this morning at 101/67.  Transferred to stepdown ICU last night.  He has a history of CAD and atrial fibrillation s/p Watchman procedure earlier this year.  EF 60-65% on TEE on 02/11/24 with no leak noted from Watchman device.  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  Objective: Vital signs in last 24 hours: Temp:  [93.6 F (34.2 C)-97.9 F (36.6 C)] 97.6 F (36.4 C) (04/25 0440) Pulse Rate:  [51-115] 65 (04/25 0700) Resp:  [10-24] 13 (04/25 0700) BP: (83-181)/(53-117) 83/66 (04/25 0700) SpO2:  [88 %-100 %] 100 % (04/25 0700) Weight:  [95 kg-98.7 kg] 98.7 kg (04/25 0002)  Intake/Output from previous day: 04/24 0701 - 04/25 0700 In: 30203.5 [P.O.:100; I.V.:3523.5; IV Piggyback:700] Out: 78295 [Urine:32730; Blood:50] Intake/Output this shift: No intake/output data recorded.  Physical Exam:  General: Alert and oriented CV: Irregular, rate controlled in 80s Lungs: Clear Abdomen: Soft, mild  tenderness over lower abdomen, no CVAT GU: Urine bright red on brisk CBI.  No clots upon hand irrigation. Ext: NT, No erythema  Lab Results: Recent Labs    03/06/24 1746 03/07/24 0306  HGB 13.7 8.7*  HCT 42.8 28.1*   BMET Recent Labs    03/07/24 0306  NA 134*  K 5.2*  CL 107  CO2 21*  GLUCOSE 221*  BUN 37*  CREATININE 2.32*  CALCIUM  7.6*     Studies/Results: No results found.  Assessment/Plan: POD # 1 s/p cystoscopy, urethral dilation, bladder biopsies and fulguration  1) Hemorrhagic cystitis: Pt with significant ongoing hematuria and acute blood loss anemia without improvement after repeat operative fulguration.  Will transfuse 1 unit PRBCs this morning and monitor H/H.  Continue CBI open all the way.  Will talk with IR this morning about left nephrostomy tube placement to help divert urine and management of hematuria.  Will check coagulation studies and consider IV meds (TXA, etc) to assist with ongoing bleeding.  2) CKD with AKI: Cr increased today.  This is likely multifactorial due to prerenal causes with acute blood loss but he may be developing left ureteral obstruction of his solitary left kidney as well due to diffuse fulguration last night.  Left nephrostomy tube placement will help manage this.  Continue to monitor renal function.  3) CAD/atrial fibrillation:  He is asymptomatic.  Will transfuse as needed.  Recent TEE was without concern.  Will consult cardiology if needed.  Continue telemetry.  4) Bladder cancer:  Biopsies from yesterday pending.  He is BCG-refractory and now with suspicious cytology s/p IV pembrolizumab .  May ultimately require cystectomy for definitive management.    LOS: 0 days   Kristeen Peto 03/07/2024, 7:24 AM

## 2024-03-07 NOTE — Procedures (Signed)
 Interventional Radiology Procedure Note  Procedure: Placement of a LEFT sided 53F percutaneous nephrostomy tube.   Complications: None  Estimated Blood Loss: None  Recommendations: - Routine tube care - Flushing not needed   Signed,  Roxie Cord, MD

## 2024-03-07 NOTE — Progress Notes (Signed)
 Patient ID: Adam Harris, male   DOB: 19-Jan-1946, 78 y.o.   MRN: [id number]  Left PCN placed successfully.  Urine now clear from Foley and CBI turned off.    Appreciate CCM involvement due to rate control of atrial fibrillation.    Overall, his situation appears to be stabilizing.

## 2024-03-07 NOTE — Consult Note (Signed)
 Chief Complaint: Patient was seen in consultation today for hemorrhagic cystitis   at the request of Florencio Hunting, MD  Referring Physician(s): Florencio Hunting, MD  Supervising Physician: Fernando Hoyer  Patient Status: [facility] - In-pt  Full Code  History of Present Illness: Adam Harris is a 78 y.o. 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  Patient underwent a transurethral resection of the bladder 03/06/24 with Dr. Rozanne Corners. Operative note reports significant hematuria, which would intermittently clot off the catheter. Patient continued to have significant post operative bleeding. Attempts with CBI were made, but the catheter kept clotting off. Patient also had fulguration, which was unsuccessful. IR consulted to assist with a left nephrostomy tube placement to divert urine.   Patient has not had anything to eat or drink since at least 8pm 03/06/24. He denies: chest pain, cough, abdominal pain, diarrhea, nausea, vomiting, and/or fever.   Past Medical History:  Diagnosis Date   Abnormal radiologic findings on diagnostic imaging of renal pelvis, ureter, or bladder    bilateral ureter abnormalities   Anticoagulant long-term use    eliquis    Anxiety    pt denies   Arthritis    Atrial fibrillation, chronic (HCC)    CAD (coronary artery disease) cardiologist-- dr hochrein   NSTEMI 02-04-2014  per cardiac cath chronic occluded RCA w/ faint left-to-right collaterals and aneurysmal LCFx with sluggish coronary flow/   NSTEMI --11-21-2016 per cardiac cath occluded proximal RCA & mid to diastal CFX 100%, med rx. If that does not work, PTCA or CABG   Cancer [facility])    bladder and kidney   CHF (congestive heart failure) (HCC)    CKD (chronic kidney disease), stage III (HCC)     patient unaware   DOE (dyspnea on exertion)    Fatty liver    pt denies   Hematuria 02/2019   History of COVID-19 10/2019   History of non-ST elevation myocardial infarction (NSTEMI)    02-04-2014  and 11-21-2016  cardiac cath done both times ,  medically management   History of shingles 12/2017   slight pain and numbness still noted in the area   Hyperlipidemia    Hypertension    Insomnia    Myocardial infarction [facility]) 2015   Persistent atrial fibrillation [facility])    cardiologsit-- dr hochrein   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 Daneil Dunker   Thoracic aortic atherosclerosis (HCC)    Type 2 diabetes mellitus (HCC)    Urinary frequency     Past Surgical History:  Procedure Laterality Date   CARDIAC CATHETERIZATION N/A 11/21/2016   Procedure: Left Heart Cath and Coronary Angiography;  Surgeon: Millicent Ally, MD;  Location: [facility] INVASIVE CV LAB;  Service: Cardiovascular;  Laterality: N/A;  pRCA 100% , ostial LAD 45%, OM3 80%, mCFX to dCFX 100% (AV groove), lateral OM3 50%   COLONOSCOPY     CYSTOSCOPY WITH BIOPSY N/A 08/12/2020   Procedure: CYSTOSCOPY WITH BLADDER BIOPSY AND TRANSURETHRAL RESECTION OF BLADDER TUMOR;  Surgeon: Florencio Hunting, MD;  Location: [facility] ORS;  Service: Urology;  Laterality: N/A;   CYSTOSCOPY WITH BIOPSY N/A 11/10/2021   Procedure: CYSTOSCOPY WITH BLADDER BIOPSIES/ LEFT RETROGRADE;  Surgeon: Florencio Hunting, MD;  Location: [facility] ORS;  Service: Urology;  Laterality: N/A;  CYSTOSCOPY WITH BIOPSY Left 07/27/2022   Procedure: CYSTOSCOPY WITH  BLADDER BIOPSY, TRANSURETHRAL FULGERATION OF BLADDER, EXAM UNDER ANESTHESIA, LEFT RETROGRADE PYELOGRAM;  Surgeon: Florencio Hunting, MD;  Location: [facility] ORS;  Service: Urology;  Laterality: Left;   CYSTOSCOPY WITH BIOPSY N/A 04/23/2023   Procedure: CYSTOSCOPY WITH BLADDER AND PROSTATIC URETHRAL BIOPSIES;  Surgeon: Florencio Hunting, MD;  Location: [facility] ORS;  Service: Urology;   Laterality: N/A;  60 MINUTES NEEDED FOR CASE   CYSTOSCOPY WITH FULGERATION N/A 03/06/2024   Procedure: CYSTOSCOPY, WITH BLADDER FULGURATION, TURBT;  Surgeon: Florencio Hunting, MD;  Location: [facility]S;  Service: Urology;  Laterality: N/A;   CYSTOSCOPY WITH URETEROSCOPY AND STENT PLACEMENT Right 12/15/2019   Procedure: CYSTOSCOPY WITH RIGHT RETROGRADE/ RIGHT URETEROSCOPY/ BIOPSY;  Surgeon: Ottelin, Mark, MD;  Location: [facility];  Service: Urology;  Laterality: Right;   CYSTOSCOPY/RETROGRADE/URETEROSCOPY Bilateral 03/18/2018   Procedure: CYSTOSCOPY/RETROGRADE/URETEROSCOPY.;  Surgeon: Ottelin, Mark, MD;  Location: [facility];  Service: Urology;  Laterality: Bilateral;   EYE SURGERY Bilateral    cateract in January 2023   HYDROCELE EXCISION Left 07/27/2022   Procedure: HYDROCELE REPAIR;  Surgeon: Florencio Hunting, MD;  Location: [facility];  Service: Urology;  Laterality: Left;  GENERAL ANESTHESIA WITH PARALYSIS   KNEE ARTHROSCOPY Right    LEFT ATRIAL APPENDAGE OCCLUSION N/A 12/13/2023   Procedure: LEFT ATRIAL APPENDAGE OCCLUSION;  Surgeon: Ardeen Kohler, MD;  Location: [facility] INVASIVE CV LAB;  Service: Cardiovascular;  Laterality: N/A;   LEFT HEART CATHETERIZATION WITH CORONARY ANGIOGRAM N/A 02/04/2014   Procedure: LEFT HEART CATHETERIZATION WITH CORONARY ANGIOGRAM;  Surgeon: Wenona Hamilton, MD;  Location: [facility] CATH LAB;  Service: Cardiovascular;  Laterality: N/A;  severe one-vessel CAD, chronically occluded RCA with faint left-to-right collaterals;  aneurysmal LCFx with sluggish coronary flow;  normal LVSF w/ moderately elevated LVEDP (ostialOM2 20%, pOM3 20%, pD1 20%, mCFX 50%, diffuse 20% pCFX)   PROSTATE BIOPSY N/A 08/12/2020   Procedure: BIOPSY TRANSRECTAL ULTRASONIC PROSTATE (TUBP);  Surgeon: Florencio Hunting, MD;  Location: [facility];  Service: Urology;  Laterality: N/A;   ROBOT ASSITED LAPAROSCOPIC NEPHROURETERECTOMY Right 03/22/2020   Procedure: XI ROBOT ASSITED LAPAROSCOPIC  NEPHROURETERECTOMY;  Surgeon: Florencio Hunting, MD;  Location: [facility];  Service: Urology;  Laterality: Right;   TRANSESOPHAGEAL ECHOCARDIOGRAM (CATH LAB) N/A 12/13/2023   Procedure: TRANSESOPHAGEAL ECHOCARDIOGRAM;  Surgeon: Ardeen Kohler, MD;  Location: [facility] INVASIVE CV LAB;  Service: Cardiovascular;  Laterality: N/A;   TRANSESOPHAGEAL ECHOCARDIOGRAM (CATH LAB) N/A 02/11/2024   Procedure: TRANSESOPHAGEAL ECHOCARDIOGRAM;  Surgeon: Harrold Lincoln, MD;  Location: [facility] INVASIVE CV LAB;  Service: Cardiovascular;  Laterality: N/A;   TRANSTHORACIC ECHOCARDIOGRAM  11-22-2016   dr hochrein   ef 55-60%/ mild MR and TR/ moderate LAE   TRANSURETHRAL RESECTION OF BLADDER TUMOR N/A 02/10/2021   Procedure: TRANSURETHRAL RESECTION OF BLADDER TUMOR (TURBT)/ CYSTOSCOPY/ LEFT RETROGRADE;  Surgeon: Florencio Hunting, MD;  Location: [facility];  Service: Urology;  Laterality: N/A;  GENERAL ANESTHESIA WITH PARALYSIS    Allergies: Xarelto  [rivaroxaban ]  Medications: Prior to Admission medications   Medication Sig Start Date End Date Taking? Authorizing Provider  amLODipine  (NORVASC ) 5 MG tablet TAKE 1 TABLET (5 MG TOTAL) BY MOUTH DAILY 12/05/23  Yes Eilleen Grates, MD  Cholecalciferol (VITAMIN D3) 1000 units CAPS Take 1,000 Units by mouth daily.   Yes [provider]  furosemide  (LASIX ) 20 MG tablet Take 1 tablet (20 mg total) by mouth daily. For the next 3 days take 40 mg (2 tabs) daily then resume one tab daily.  02/15/24  Yes Eilleen Grates, MD  isosorbide  mononitrate (IMDUR ) 30 MG 24 hr tablet TAKE ONE TABLET BY MOUTH DAILY 08/24/23  Yes Graig Lawyer, MD  JANUVIA  25 MG tablet TAKE ONE TABLET BY MOUTH DAILY 01/23/24  Yes Graig Lawyer, MD  metoprolol  tartrate (LOPRESSOR ) 25 MG tablet TAKE ONE TABLET BY MOUTH TWICE A DAY 06/11/23  Yes Eilleen Grates, MD  olmesartan  (BENICAR ) 40 MG tablet Take 40 mg by mouth daily.   Yes [provider]  phenazopyridine  (PYRIDIUM ) 200 MG tablet Take 1 tablet (200 mg  total) by mouth 3 (three) times daily as needed for pain. 12/28/23  Yes RuddMalcolm Scrivener, MD  rosuvastatin  (CRESTOR ) 40 MG tablet TAKE 1 TABLET (40 MG TOTAL) BY MOUTH EVERY MORNING. PLEASE KEEP SCHEDULED APPOINTMENT 08/24/23  Yes Eilleen Grates, MD  sodium bicarbonate 650 MG tablet Take 650 mg by mouth 2 (two) times daily.   Yes [provider]  tamsulosin  (FLOMAX ) 0.4 MG CAPS capsule TAKE ONE CAPSULE BY MOUTH DAILY 02/22/24  Yes Graig Lawyer, MD  clopidogrel  (PLAVIX ) 75 MG tablet Take 1 tablet (75 mg total) by mouth daily. 11/05/23   McDaniel, Jill D, NP  hydrOXYzine  (VISTARIL ) 25 MG capsule Take 1 capsule (25 mg total) by mouth every 8 (eight) hours as needed. 05/11/23   Graig Lawyer, MD  nitrofurantoin , macrocrystal-monohydrate, (MACROBID ) 100 MG capsule Take 1 capsule (100 mg total) by mouth 2 (two) times daily. 01/31/24   Boscia, Heather E, NP  nitroGLYCERIN  (NITROSTAT ) 0.4 MG SL tablet TAKE ONE TABLET UNDER THE TONGUE EVERY FIVE MINUTES FOR THREE DOSES AS NEEDED FOR CHEST PAIN, CALL 911 IF 2ND DOSE DOESN'T HELP 11/10/22   Eilleen Grates, MD  oxybutynin  (DITROPAN  XL) 15 MG 24 hr tablet Take 1 tablet (15 mg total) by mouth 2 (two) times daily as needed. 12/19/23   Boscia, Heather E, NP  Pembrolizumab  (KEYTRUDA  IV) Inject into the vein every 14 (fourteen) days.    [provider]  traZODone  (DESYREL ) 50 MG tablet Take 0.5-1 tablets (25-50 mg total) by mouth at bedtime as needed for sleep. 10/16/23   Graig Lawyer, MD     Family History  Problem Relation Age of Onset   Hypertension Mother    Cancer Mother        anal cancer   Esophageal cancer Neg Hx    Pancreatic cancer Neg Hx    Stomach cancer Neg Hx    Liver disease Neg Hx     Social History   Socioeconomic History   Marital status: Married    Spouse name: Amalia Badder   Number of children: 3   Years of education: Not on file   Highest education level: Master's degree (e.g., MA, MS, MEng, MEd, MSW, MBA)  Occupational  History   Occupation: Retired- Psychologist, occupational    Comment: [employer]  Tobacco Use   Smoking status: Former    Current packs/day: 0.00    Average packs/day: 0.3 packs/day for 4.0 years (1.0 ttl pk-yrs)    Types: Cigarettes    Start date: 1968    Quit date: 1972    Years since quitting: 53.3    Passive exposure: Past   Smokeless tobacco: Former    Types: Chew    Quit date: 1972  Vaping Use   Vaping status: Never Used  Substance and Sexual Activity   Alcohol use: No   Drug use: No   Sexual activity: Yes  Other Topics Concern   Not on  file  Social History Narrative   Not on file   Social Drivers of Health   Financial Resource Strain: Low Risk  (08/06/2023)   Overall Financial Resource Strain (CARDIA)    Difficulty of Paying Living Expenses: Not hard at all  Food Insecurity: No Food Insecurity (03/06/2024)   Hunger Vital Sign    Worried About Running Out of Food in the Last Year: Never true    Ran Out of Food in the Last Year: Never true  Transportation Needs: No Transportation Needs (03/06/2024)   PRAPARE - Administrator, Civil Service (Medical): No    Lack of Transportation (Non-Medical): No  Physical Activity: Inactive (08/06/2023)   Exercise Vital Sign    Days of Exercise per Week: 0 days    Minutes of Exercise per Session: 0 min  Stress: No Stress Concern Present (08/06/2023)   [employer] of Occupational Health - Occupational Stress Questionnaire    Feeling of Stress : Not at all  Social Connections: Moderately Integrated (03/06/2024)   Social Connection and Isolation Panel [NHANES]    Frequency of Communication with Friends and Family: More than three times a week    Frequency of Social Gatherings with Friends and Family: More than three times a week    Attends Religious Services: More than 4 times per year    Active Member of [employer] or Organizations: No    Attends Banker Meetings: Never    Marital Status: Married    Review of Systems:  A 12 point ROS discussed and pertinent positives are indicated in the HPI above.  All other systems are negative.  Review of Systems  Constitutional:  Negative for fever.  Respiratory:  Positive for shortness of breath. Negative for cough.   Cardiovascular:  Negative for chest pain.  Gastrointestinal:  Negative for abdominal pain, diarrhea, nausea and vomiting.    Vital Signs: BP (!) 83/66   Pulse 65   Temp 97.6 F (36.4 C) (Axillary)   Resp 13   Ht 5\' 11"  (1.803 m)   Wt 217 lb 9.5 oz (98.7 kg)   SpO2 100%   BMI 30.35 kg/m   Advance Care Plan: The advanced care plan/surrogate decision maker was discussed at the time of visit and documented in the medical record.    Physical Exam HENT:     Head: Normocephalic and atraumatic.     Mouth/Throat:     Mouth: Mucous membranes are dry.     Pharynx: Oropharynx is clear.  Cardiovascular:     Rate and Rhythm: Normal rate and regular rhythm.  Pulmonary:     Effort: Pulmonary effort is normal.     Breath sounds: Normal breath sounds.  Abdominal:     General: Abdomen is flat.  Genitourinary:    Comments: Foley in place with hematuria output  Musculoskeletal:     Cervical back: Normal range of motion.  Skin:    General: Skin is warm.  Neurological:     General: No focal deficit present.     Mental Status: He is alert and oriented to person, place, and time.     Imaging: ECHO TEE Result Date: 02/11/2024    TRANSESOPHOGEAL ECHO REPORT   Patient Name:   BASTION BOLGER  Date of Exam: 02/11/2024 Medical Rec #:  [id number]  Height:       71.0 in Accession #:    [id number] Weight:       219.4 lb Date of Birth:  [birth year]  BSA:          2.193 m Patient Age:    78 years   BP:           156/107 mmHg Patient Gender: M          HR:           82 bpm. Exam Location:  Inpatient Procedure: 3D Echo, Transesophageal Echo, Cardiac Doppler and Color Doppler            (Both Spectral and Color Flow Doppler were utilized during            procedure).  Indications:     Watchman Evaluation  History:         Patient has prior history of Echocardiogram examinations, most                  recent 12/13/2023. CAD; Risk Factors:Hypertension, Diabetes and                  Dyslipidemia. Watchman Device Implanted 12/13/23.  Sonographer:     Sherline Distel Senior RDCS Sonographer#2:   Kip Peon Referring Phys:  [id number] Diagnosing Phys: Jackquelyn Mass MD PROCEDURE: After discussion of the risks and benefits of a TEE, an informed consent was obtained from the patient. TEE procedure time was 12 minutes. The transesophogeal probe was passed without difficulty through the esophogus of the patient. Imaged were obtained with the patient in a left lateral decubitus position. Sedation performed by different physician. The patient was monitored while under deep sedation. Anesthestetic sedation was provided intravenously by Anesthesiology: 470mg  of Propofol , 60mg  of Lidocaine . Image quality was excellent. The patient's vital signs; including heart rate, blood pressure, and oxygen saturation; remained stable throughout the procedure. The patient developed no complications during the procedure.  IMPRESSIONS  1. 27 mm Watchman FLX is LAA. No device leak or device-related thrombus. Left atrial size was severely dilated. No left atrial/left atrial appendage thrombus was detected.  2. Left ventricular ejection fraction, by estimation, is 60 to 65%. The left ventricle has normal function. The left ventricle has no regional wall motion abnormalities.  3. Right ventricular systolic function is normal. The right ventricular size is normal.  4. Right atrial size was mild to moderately dilated.  5. The mitral valve is grossly normal. Mild to moderate mitral valve regurgitation. No evidence of mitral stenosis.  6. The aortic valve is tricuspid. Aortic valve regurgitation is trivial. No aortic stenosis is present.  7. There is mild (Grade II) protruding plaque involving the descending aorta  and aortic arch.  8. 3D performed of the LAA and demonstrates 3D LAA assessment of Watchman device. Normal. FINDINGS  Left Ventricle: Left ventricular ejection fraction, by estimation, is 60 to 65%. The left ventricle has normal function. The left ventricle has no regional wall motion abnormalities. The left ventricular internal cavity size was normal in size. Right Ventricle: The right ventricular size is normal. No increase in right ventricular wall thickness. Right ventricular systolic function is normal. Left Atrium: 27 mm Watchman FLX is LAA. No device leak or device-related thrombus. Left atrial size was severely dilated. No left atrial/left atrial appendage thrombus was detected. Right Atrium: Right atrial size was mild to moderately dilated. Pericardium: There is no evidence of pericardial effusion. Mitral Valve: The mitral valve is grossly normal. Mild to moderate mitral valve regurgitation. No evidence of mitral valve stenosis. Tricuspid Valve: The tricuspid valve is grossly normal. Tricuspid valve regurgitation is mild . No evidence of  tricuspid stenosis. Aortic Valve: The aortic valve is tricuspid. Aortic valve regurgitation is trivial. No aortic stenosis is present. Pulmonic Valve: The pulmonic valve was grossly normal. Pulmonic valve regurgitation is trivial. No evidence of pulmonic stenosis. Aorta: The aortic root and ascending aorta are structurally normal, with no evidence of dilitation. There is mild (Grade II) protruding plaque involving the descending aorta and aortic arch. Venous: The left upper pulmonary vein, left lower pulmonary vein, right lower pulmonary vein and right upper pulmonary vein are normal. IAS/Shunts: The atrial septum is grossly normal. Additional Comments: 3D was performed not requiring image post processing on an independent workstation and was normal. Spectral Doppler performed.  AORTA Ao Root diam: 3.32 cm Ao Asc diam:  3.85 cm TRICUSPID VALVE TR Peak grad:   38.4 mmHg TR  Vmax:        310.00 cm/s Jackquelyn Mass MD Electronically signed by Jackquelyn Mass MD Signature Date/Time: 02/11/2024/12:13:50 PM    Final    EP STUDY Result Date: 02/11/2024 See surgical note for result.   Labs:  CBC: Recent Labs    12/22/23 1924 01/10/24 1315 01/31/24 1237 02/21/24 1255 03/06/24 1746 03/07/24 0306  WBC 10.5 11.6* 10.5 13.9*  --   --   HGB 12.9* 13.4 13.5 13.5 13.7 8.7*  HCT 40.0 41.5 41.1 40.9 42.8 28.1*  PLT 248 190 185 220  --   --     COAGS: Recent Labs    12/23/23 0025  INR 1.4*  APTT 60*    BMP: Recent Labs    01/10/24 1315 01/31/24 1237 02/21/24 1255 03/07/24 0306  NA 139 137 136 134*  K 4.3 4.2 4.4 5.2*  CL 110 108 107 107  CO2 23 23 26  21*  GLUCOSE 165* 133* 206* 221*  BUN 39* 35* 42* 37*  CALCIUM  9.4 9.3 9.0 7.6*  CREATININE 2.08* 1.79* 1.88* 2.32*  GFRNONAA 32* 39* 36* 28*    LIVER FUNCTION TESTS: Recent Labs    12/22/23 1924 01/10/24 1315 01/31/24 1237 02/21/24 1255  BILITOT 2.4* 2.5* 2.7* 2.0*  AST 21 17 17  12*  ALT 14 11 11 11   ALKPHOS 115 112 105 125  PROT 7.4 7.6 7.6 7.5  ALBUMIN  3.2* 3.7 3.8 3.6    TUMOR MARKERS: No results for input(s): "AFPTM", "CEA", "CA199", "CHROMGRNA" in the last 8760 hours.  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  IR requested for left nephrostomy tube placement due to hemorrhagic cystitis. Patient is s/p cystoscopy and transurethral bladder biopsies 03/06/24. Significant bleeding followed and bedside irrigation was unsuccessful. Patient also had fulguration without success. Patient is currently receiving blood transfusion. He reports not having anything to eat or drink since 8 pm 03/06/24.   Risks and benefits  of left PCN placement was discussed with the patient including, but not limited to, infection, bleeding, significant bleeding causing loss or decrease in renal function or damage to adjacent structures.   All of the patient's questions were answered, patient is agreeable to proceed.  Consent signed and in chart.  Thank you for this interesting consult.  I greatly enjoyed meeting Adam Harris and look forward to participating in their care.  A copy of this report was sent to the requesting provider on this date.  Electronically  Signed: Lawrance Presume, PA 03/07/2024, 8:31 AM   I spent a total of 20 Minutes    in face to face in clinical consultation, greater than 50% of which was counseling/coordinating care for hemorrhagic cystitis

## 2024-03-07 NOTE — Consult Note (Signed)
 NAME:  Adam Harris, MRN:  [id number], DOB:  [birth year], LOS: 0 ADMISSION DATE:  03/06/2024, CONSULTATION DATE:  4/25 REFERRING MD:  Dr. Rozanne Corners, CHIEF COMPLAINT:  hematuria; tachyarrhythmia   History of Present Illness:  Patient is a 78 year old male with pertinent PMH CAD, chronic diastolic CHF, HTN, HLD, DMT2, CKD 4, PAF, urothelial cancer admitted to [facility] on 4/24 for   Patient began treatemtn w/ iv pembrolizumab  every 3 weeks sinc August 2024. Has been having hematuria and his home eliquis  was held. Remains on plavix . Still having intermittent hamaturia. Came to [facility] on 4/24 for surveillance cystoscopy and transurethral bladder biopsies by urology. Post op having significant bleeding clotting off his catheter. Returned to OR and underwent fulguration with much success. Hgb drop to 8.7 from 13 and bp soft. Transferred to SDU. On 4/25 IR placed left nephrostomy tube.Patient developed tachyarrhythmia. PCCM consulted.  Pertinent  Medical History   Past Medical History:  Diagnosis Date   Abnormal radiologic findings on diagnostic imaging of renal pelvis, ureter, or bladder    bilateral ureter abnormalities   Anticoagulant long-term use    eliquis    Anxiety    pt denies   Arthritis    Atrial fibrillation, chronic (HCC)    CAD (coronary artery disease) cardiologist-- dr hochrein   NSTEMI 02-04-2014  per cardiac cath chronic occluded RCA w/ faint left-to-right collaterals and aneurysmal LCFx with sluggish coronary flow/   NSTEMI --11-21-2016 per cardiac cath occluded proximal RCA & mid to diastal CFX 100%, med rx. If that does not work, PTCA or CABG   Cancer [facility])    bladder and kidney   CHF (congestive heart failure) (HCC)    CKD (chronic kidney disease), stage III (HCC)    patient unaware   DOE (dyspnea on exertion)    Fatty liver    pt denies   Hematuria 02/2019   History of COVID-19 10/2019   History of non-ST elevation myocardial infarction (NSTEMI)    02-04-2014  and 11-21-2016  cardiac  cath done both times ,  medically management   History of shingles 12/2017   slight pain and numbness still noted in the area   Hyperlipidemia    Hypertension    Insomnia    Myocardial infarction (HCC) 2015   Persistent atrial fibrillation (HCC)    cardiologsit-- dr hochrein   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 Daneil Dunker   Thoracic aortic atherosclerosis (HCC)    Type 2 diabetes mellitus (HCC)    Urinary frequency      Significant Hospital Events: Including procedures, antibiotic start and stop dates in addition to other pertinent events   4/24 s/p cystoscopy and biopsy; hematuria requiring fulguration 4/25 tachyarrhythmia; pccm consulted   Interim History / Subjective:  See above  Objective   Blood pressure (!) 100/52, pulse 62, temperature (!) 96.7 F (35.9 C), temperature source Axillary, resp. rate 14, height 5\' 11"  (1.803 m), weight 98.7 kg, SpO2 100%.        Intake/Output Summary (Last 24 hours) at 03/07/2024 1549 Last data filed at 03/07/2024 1416 Gross per 24 hour  Intake 45378.51 ml  Output 78295 ml  Net -751.49 ml   Filed Weights   03/06/24 1146 03/07/24 0002  Weight: 95 kg 98.7 kg    Examination: General:  NAD HEENT: MM pink/moist Neuro: Aox3; MAE CV: s1s2, irregular irregular, no m/r/g PULM:  dim clear BS bilaterally; [place] GI: soft, bsx4 active  Extremities: warm/dry, no edema  Skin: no rashes or lesions    Resolved Hospital Problem list     Assessment & Plan:   Tachyarrhythmia w/ wide complex Hx of PAF s/p watchman: off AC w/ hematuria Hx of bladder cancer s/p biopsy 4/24 and hematuria and left nephrostomy tube placed 4/25 ABLA Plan: -tele monitoring -check mag; trend/replete electrolytes as needed -hgb dropped from 13 to 9. Continue to monitor and transfuse per protocol -resume home metoprolol ; if bp remains soft consider amio instead -hold AC w/ hematuria -consider  cardiology consult -continue bladder irrigation per urology -follow biopsy -iv fluids  HTN CAD Chronic diastolic chf Plan: -cont metoprolol ; recommend holding other home anti-htn meds for now -hold lasix  for now as appears volume depleted after blood loss -hold DAPT -cont statin  AKI on CKD IV Mild hyperkalemia and hyponatremia Plan: -Trend BMP / urinary output -Replace electrolytes as indicated -Avoid nephrotoxic agents, ensure adequate renal perfusion  T2DM Plan: -ssi and cbg monitoring   Best Practice (right click and "Reselect all SmartList Selections" daily)   Per primary  Labs   CBC: Recent Labs  Lab 03/06/24 1746 03/07/24 0306 03/07/24 1302  HGB 13.7 8.7* 9.2*  HCT 42.8 28.1* 29.0*    Basic Metabolic Panel: Recent Labs  Lab 03/07/24 0306  NA 134*  K 5.2*  CL 107  CO2 21*  GLUCOSE 221*  BUN 37*  CREATININE 2.32*  CALCIUM  7.6*   GFR: Estimated Creatinine Clearance: 31.9 mL/min (A) (by C-G formula based on SCr of 2.32 mg/dL (H)). No results for input(s): "PROCALCITON", "WBC", "LATICACIDVEN" in the last 168 hours.  Liver Function Tests: No results for input(s): "AST", "ALT", "ALKPHOS", "BILITOT", "PROT", "ALBUMIN " in the last 168 hours. No results for input(s): "LIPASE", "AMYLASE" in the last 168 hours. No results for input(s): "AMMONIA" in the last 168 hours.  ABG    Component Value Date/Time   TCO2 25 12/15/2019 0642     Coagulation Profile: Recent Labs  Lab 03/07/24 0806  INR 1.6*    Cardiac Enzymes: No results for input(s): "CKTOTAL", "CKMB", "CKMBINDEX", "TROPONINI" in the last 168 hours.  HbA1C: Hgb A1c MFr Bld  Date/Time Value Ref Range Status  08/06/2023 01:33 PM 6.7 (H) 4.6 - 6.5 % Final    Comment:    Glycemic Control Guidelines for People with Diabetes:Non Diabetic:  <6%Goal of Therapy: <7%Additional Action Suggested:  >8%   04/13/2023 11:20 AM 6.7 (H) 4.8 - 5.6 % Final    Comment:    (NOTE)         Prediabetes: 5.7  - 6.4         Diabetes: >6.4         Glycemic control for adults with diabetes: <7.0     CBG: Recent Labs  Lab 03/06/24 1143 03/06/24 1338 03/06/24 1806 03/06/24 2009  GLUCAP 125* 133* 188* 172*    Review of Systems:   Review of Systems  Constitutional:  Negative for fever.  Respiratory:  Negative for shortness of breath.   Cardiovascular:  Negative for chest pain.  Gastrointestinal:  Negative for abdominal pain, nausea and vomiting.     Past Medical History:  He,  has a past medical history of Abnormal radiologic findings on diagnostic imaging of renal pelvis, ureter, or bladder, Anticoagulant long-term use, Anxiety, Arthritis, Atrial fibrillation, chronic (HCC), CAD (coronary artery disease) (cardiologist-- dr hochrein), Cancer [facility]), CHF (congestive heart failure) (HCC), CKD (chronic kidney disease), stage III (HCC), DOE (dyspnea on exertion), Fatty liver, Hematuria (  02/2019), History of COVID-19 (10/2019), History of non-ST elevation myocardial infarction (NSTEMI), History of shingles (12/2017), Hyperlipidemia, Hypertension, Insomnia, Myocardial infarction (HCC) (2015), Persistent atrial fibrillation (HCC), Presence of Watchman left atrial appendage closure device (12/13/2023), Thoracic aortic atherosclerosis (HCC), Type 2 diabetes mellitus (HCC), and Urinary frequency.   Surgical History:   Past Surgical History:  Procedure Laterality Date   CARDIAC CATHETERIZATION N/A 11/21/2016   Procedure: Left Heart Cath and Coronary Angiography;  Surgeon: Millicent Ally, MD;  Location: [facility] INVASIVE CV LAB;  Service: Cardiovascular;  Laterality: N/A;  pRCA 100% , ostial LAD 45%, OM3 80%, mCFX to dCFX 100% (AV groove), lateral OM3 50%   COLONOSCOPY     CYSTOSCOPY WITH BIOPSY N/A 08/12/2020   Procedure: CYSTOSCOPY WITH BLADDER BIOPSY AND TRANSURETHRAL RESECTION OF BLADDER TUMOR;  Surgeon: Florencio Hunting, MD;  Location: [facility] ORS;  Service: Urology;  Laterality: N/A;   CYSTOSCOPY WITH BIOPSY  N/A 11/10/2021   Procedure: CYSTOSCOPY WITH BLADDER BIOPSIES/ LEFT RETROGRADE;  Surgeon: Florencio Hunting, MD;  Location: [facility];  Service: Urology;  Laterality: N/A;   CYSTOSCOPY WITH BIOPSY Left 07/27/2022   Procedure: CYSTOSCOPY WITH  BLADDER BIOPSY, TRANSURETHRAL FULGERATION OF BLADDER, EXAM UNDER ANESTHESIA, LEFT RETROGRADE PYELOGRAM;  Surgeon: Florencio Hunting, MD;  Location: [facility];  Service: Urology;  Laterality: Left;   CYSTOSCOPY WITH BIOPSY N/A 04/23/2023   Procedure: CYSTOSCOPY WITH BLADDER AND PROSTATIC URETHRAL BIOPSIES;  Surgeon: Florencio Hunting, MD;  Location: [facility];  Service: Urology;  Laterality: N/A;  60 MINUTES NEEDED FOR CASE   CYSTOSCOPY WITH BIOPSY N/A 03/06/2024   Procedure: CYSTOSCOPY, WITH BIOPSY;  Surgeon: Florencio Hunting, MD;  Location: [facility];  Service: Urology;  Laterality: N/A;  CYSTOSCOPY WITH BLADDER BIOPSIES   CYSTOSCOPY WITH FULGERATION N/A 03/06/2024   Procedure: CYSTOSCOPY, WITH BLADDER FULGURATION, TURBT;  Surgeon: Florencio Hunting, MD;  Location: [facility];  Service: Urology;  Laterality: N/A;   CYSTOSCOPY WITH URETEROSCOPY AND STENT PLACEMENT Right 12/15/2019   Procedure: CYSTOSCOPY WITH RIGHT RETROGRADE/ RIGHT URETEROSCOPY/ BIOPSY;  Surgeon: Ottelin, Mark, MD;  [facility];  Service: Urology;  Laterality: Right;   CYSTOSCOPY/RETROGRADE/URETEROSCOPY Bilateral 03/18/2018   Procedure: CYSTOSCOPY/RETROGRADE/URETEROSCOPY.;  Surgeon: Ottelin, Mark, MD;  Location: [facility];  Service: Urology;  Laterality: Bilateral;   EYE SURGERY Bilateral    cateract in January 2023   HYDROCELE EXCISION Left 07/27/2022   Procedure: HYDROCELE REPAIR;  Surgeon: Florencio Hunting, MD;  Location: [facility];  Service: Urology;  Laterality: Left;  GENERAL ANESTHESIA WITH PARALYSIS   IR NEPHROSTOMY PLACEMENT LEFT  03/07/2024   KNEE ARTHROSCOPY Right    LEFT ATRIAL APPENDAGE OCCLUSION N/A 12/13/2023   Procedure: LEFT ATRIAL APPENDAGE OCCLUSION;  Surgeon: Ardeen Kohler, MD;  Location: [facility] INVASIVE CV LAB;  Service: Cardiovascular;  Laterality: N/A;   LEFT HEART CATHETERIZATION WITH CORONARY ANGIOGRAM N/A 02/04/2014   Procedure: LEFT HEART CATHETERIZATION WITH CORONARY ANGIOGRAM;  Surgeon: Wenona Hamilton, MD;  Location: [facility] CATH LAB;  Service: Cardiovascular;  Laterality: N/A;  severe one-vessel CAD, chronically occluded RCA with faint left-to-right collaterals;  aneurysmal LCFx with sluggish coronary flow;  normal LVSF w/ moderately elevated LVEDP (ostialOM2 20%, pOM3 20%, pD1 20%, mCFX 50%, diffuse 20% pCFX)   PROSTATE BIOPSY N/A 08/12/2020   Procedure: BIOPSY TRANSRECTAL ULTRASONIC PROSTATE (TUBP);  Surgeon: Florencio Hunting, MD;  Location: [facility];  Service: Urology;  Laterality: N/A;   ROBOT ASSITED LAPAROSCOPIC NEPHROURETERECTOMY Right 03/22/2020   Procedure: XI ROBOT ASSITED LAPAROSCOPIC NEPHROURETERECTOMY;  Surgeon: Florencio Hunting, MD;  Location: [facility];  Service: Urology;  Laterality: Right;   TRANSESOPHAGEAL ECHOCARDIOGRAM (CATH LAB) N/A 12/13/2023   Procedure: TRANSESOPHAGEAL ECHOCARDIOGRAM;  Surgeon: Ardeen Kohler, MD;  Location: [facility] INVASIVE CV LAB;  Service: Cardiovascular;  Laterality: N/A;   TRANSESOPHAGEAL ECHOCARDIOGRAM (CATH LAB) N/A 02/11/2024   Procedure: TRANSESOPHAGEAL ECHOCARDIOGRAM;  Surgeon: Harrold Lincoln, MD;  Location: [facility] INVASIVE CV LAB;  Service: Cardiovascular;  Laterality: N/A;   TRANSTHORACIC ECHOCARDIOGRAM  11-22-2016   dr hochrein   ef 55-60%/ mild MR and TR/ moderate LAE   TRANSURETHRAL RESECTION OF BLADDER TUMOR N/A 02/10/2021   Procedure: TRANSURETHRAL RESECTION OF BLADDER TUMOR (TURBT)/ CYSTOSCOPY/ LEFT RETROGRADE;  Surgeon: Florencio Hunting, MD;  Location: [facility];  Service: Urology;  Laterality: N/A;  GENERAL ANESTHESIA WITH PARALYSIS     Social History:   reports that he quit smoking about 53 years ago. His smoking use included cigarettes. He started smoking about 57 years ago. He has a 1 pack-year smoking history.  He has been exposed to tobacco smoke. He quit smokeless tobacco use about 53 years ago.  His smokeless tobacco use included chew. He reports that he does not drink alcohol and does not use drugs.   Family History:  His family history includes Cancer in his mother; Hypertension in his mother. There is no history of Esophageal cancer, Pancreatic cancer, Stomach cancer, or Liver disease.   Allergies Allergies  Allergen Reactions   Xarelto  [Rivaroxaban ] Other (See Comments)    Skin Blisters      Home Medications  Prior to Admission medications   Medication Sig Start Date End Date Taking? Authorizing Provider  amLODipine  (NORVASC ) 5 MG tablet TAKE 1 TABLET (5 MG TOTAL) BY MOUTH DAILY 12/05/23  Yes Eilleen Grates, MD  Cholecalciferol (VITAMIN D3) 1000 units CAPS Take 1,000 Units by mouth daily.   Yes [provider]  furosemide  (LASIX ) 20 MG tablet Take 1 tablet (20 mg total) by mouth daily. For the next 3 days take 40 mg (2 tabs) daily then resume one tab daily. 02/15/24  Yes Eilleen Grates, MD  isosorbide  mononitrate (IMDUR ) 30 MG 24 hr tablet TAKE ONE TABLET BY MOUTH DAILY 08/24/23  Yes Graig Lawyer, MD  JANUVIA  25 MG tablet TAKE ONE TABLET BY MOUTH DAILY 01/23/24  Yes Graig Lawyer, MD  metoprolol  tartrate (LOPRESSOR ) 25 MG tablet TAKE ONE TABLET BY MOUTH TWICE A DAY 06/11/23  Yes Eilleen Grates, MD  olmesartan  (BENICAR ) 40 MG tablet Take 40 mg by mouth daily.   Yes [provider]  phenazopyridine  (PYRIDIUM ) 200 MG tablet Take 1 tablet (200 mg total) by mouth 3 (three) times daily as needed for pain. 12/28/23  Yes RuddMalcolm Scrivener, MD  rosuvastatin  (CRESTOR ) 40 MG tablet TAKE 1 TABLET (40 MG TOTAL) BY MOUTH EVERY MORNING. PLEASE KEEP SCHEDULED APPOINTMENT 08/24/23  Yes Eilleen Grates, MD  sodium bicarbonate 650 MG tablet Take 650 mg by mouth 2 (two) times daily.   Yes [provider]  tamsulosin  (FLOMAX ) 0.4 MG CAPS capsule TAKE ONE CAPSULE BY MOUTH DAILY 02/22/24   Yes Graig Lawyer, MD  clopidogrel  (PLAVIX ) 75 MG tablet Take 1 tablet (75 mg total) by mouth daily. 11/05/23   McDaniel, Jill D, NP  hydrOXYzine  (VISTARIL ) 25 MG capsule Take 1 capsule (25 mg total) by mouth every 8 (eight) hours as needed. 05/11/23   Graig Lawyer, MD  nitrofurantoin , macrocrystal-monohydrate, (MACROBID ) 100 MG capsule Take 1 capsule (100 mg total) by mouth 2 (two) times daily. 01/31/24  Boscia, Heather E, NP  nitroGLYCERIN  (NITROSTAT ) 0.4 MG SL tablet TAKE ONE TABLET UNDER THE TONGUE EVERY FIVE MINUTES FOR THREE DOSES AS NEEDED FOR CHEST PAIN, CALL 911 IF 2ND DOSE DOESN'T HELP 11/10/22   Eilleen Grates, MD  oxybutynin  (DITROPAN  XL) 15 MG 24 hr tablet Take 1 tablet (15 mg total) by mouth 2 (two) times daily as needed. 12/19/23   Boscia, Heather E, NP  Pembrolizumab  (KEYTRUDA  IV) Inject into the vein every 14 (fourteen) days.    [provider]  traZODone  (DESYREL ) 50 MG tablet Take 0.5-1 tablets (25-50 mg total) by mouth at bedtime as needed for sleep. 10/16/23   Graig Lawyer, MD     Critical care time: NA     JD Vira Grieves [facility] 03/07/2024, 3:49 PM  Please see [email or web address] for pager details.  From 7A-7P if no response, please call [phone]. After hours, please call ELink [phone].

## 2024-03-07 NOTE — Progress Notes (Addendum)
 eLink Physician-Brief Progress Note Patient Name: Adam Harris DOB: 30-May-1946 MRN: [id number]   Date of Service  03/07/2024  HPI/Events of Note  SBP 80s-90s. Normal mentation on camera check Reporting pain at nephrostomy site  eICU Interventions  LR bolus 500 cc ordered Holding antihypertensive agents OK to give vicodin as this is unlikely to cause acute drop in pressure   5:50 AM H/H demonstrated Hg dropped from 9.4>7.7. Repeat STAT CBC to verify. Currently HDS  Intervention Category Evaluation Type: New Patient Evaluation  Mckenna Boruff Genetta Kenning 03/07/2024, 8:24 PM

## 2024-03-07 NOTE — Plan of Care (Signed)
  Problem: Elimination: Goal: Will not experience complications related to urinary retention Outcome: Not Progressing   Problem: Education: Goal: Knowledge of the prescribed therapeutic regimen will improve Outcome: Not Progressing   Problem: Urinary Elimination: Goal: Ability to avoid or minimize complications of infection will improve Outcome: Not Progressing

## 2024-03-07 NOTE — Progress Notes (Signed)
   03/07/24 1505  TOC Brief Assessment  Insurance and Status Reviewed  Patient has primary care physician Yes  Home environment has been reviewed single family home  Prior level of function: independent  Prior/Current Home Services No current home services  Social Drivers of Health Review SDOH reviewed no interventions necessary  Readmission risk has been reviewed Yes  Transition of care needs transition of care needs identified, TOC will continue to follow    [place], LCSW 03/07/2024 3:06 PM

## 2024-03-07 NOTE — Care Management Obs Status (Signed)
 MEDICARE OBSERVATION STATUS NOTIFICATION   Patient Details  Name: Adam Harris MRN: [id number] Date of Birth: 1946/08/03   Medicare Observation Status Notification Given:   Yes    Hercules Hasler A Jassmine Vandruff, LCSW 03/07/2024, 2:56 PM

## 2024-03-08 DIAGNOSIS — N179 Acute kidney failure, unspecified: Secondary | ICD-10-CM

## 2024-03-08 DIAGNOSIS — C679 Malignant neoplasm of bladder, unspecified: Secondary | ICD-10-CM | POA: Diagnosis not present

## 2024-03-08 DIAGNOSIS — R310 Gross hematuria: Secondary | ICD-10-CM

## 2024-03-08 DIAGNOSIS — N189 Chronic kidney disease, unspecified: Secondary | ICD-10-CM

## 2024-03-08 DIAGNOSIS — D62 Acute posthemorrhagic anemia: Secondary | ICD-10-CM

## 2024-03-08 LAB — CBC
HCT: 25.4 % — ABNORMAL LOW (ref 39.0–52.0)
Hemoglobin: 8 g/dL — ABNORMAL LOW (ref 13.0–17.0)
MCH: 27.5 pg (ref 26.0–34.0)
MCHC: 31.5 g/dL (ref 30.0–36.0)
MCV: 87.3 fL (ref 80.0–100.0)
Platelets: 152 10*3/uL (ref 150–400)
RBC: 2.91 MIL/uL — ABNORMAL LOW (ref 4.22–5.81)
RDW: 16.5 % — ABNORMAL HIGH (ref 11.5–15.5)
WBC: 20.6 10*3/uL — ABNORMAL HIGH (ref 4.0–10.5)
nRBC: 0 % (ref 0.0–0.2)

## 2024-03-08 LAB — GLUCOSE, CAPILLARY
Glucose-Capillary: 132 mg/dL — ABNORMAL HIGH (ref 70–99)
Glucose-Capillary: 152 mg/dL — ABNORMAL HIGH (ref 70–99)
Glucose-Capillary: 167 mg/dL — ABNORMAL HIGH (ref 70–99)
Glucose-Capillary: 172 mg/dL — ABNORMAL HIGH (ref 70–99)
Glucose-Capillary: 178 mg/dL — ABNORMAL HIGH (ref 70–99)
Glucose-Capillary: 208 mg/dL — ABNORMAL HIGH (ref 70–99)

## 2024-03-08 LAB — BASIC METABOLIC PANEL WITH GFR
Anion gap: 8 (ref 5–15)
BUN: 57 mg/dL — ABNORMAL HIGH (ref 8–23)
CO2: 21 mmol/L — ABNORMAL LOW (ref 22–32)
Calcium: 7.8 mg/dL — ABNORMAL LOW (ref 8.9–10.3)
Chloride: 107 mmol/L (ref 98–111)
Creatinine, Ser: 2.85 mg/dL — ABNORMAL HIGH (ref 0.61–1.24)
GFR, Estimated: 22 mL/min — ABNORMAL LOW (ref >60)
Glucose, Bld: 151 mg/dL — ABNORMAL HIGH (ref 70–99)
Potassium: 5 mmol/L (ref 3.5–5.1)
Sodium: 136 mmol/L (ref 135–145)

## 2024-03-08 LAB — HEMOGLOBIN AND HEMATOCRIT, BLOOD
HCT: 23.9 % — ABNORMAL LOW (ref 39.0–52.0)
HCT: 27.6 % — ABNORMAL LOW (ref 39.0–52.0)
Hemoglobin: 7.7 g/dL — ABNORMAL LOW (ref 13.0–17.0)
Hemoglobin: 8.9 g/dL — ABNORMAL LOW (ref 13.0–17.0)

## 2024-03-08 LAB — PREPARE RBC (CROSSMATCH)

## 2024-03-08 MED ORDER — SODIUM CHLORIDE 0.9% IV SOLUTION: Freq: Once | INTRAVENOUS | Status: AC

## 2024-03-08 NOTE — Plan of Care (Signed)

## 2024-03-08 NOTE — Progress Notes (Signed)
 Referring Physician(s): Dr. Rozanne Corners  Supervising Physician: Art Largo  Patient Status:  [facility] - In-pt  Chief Complaint: Hemorrhagic cystitis  Subjective: Patient resting comfortably in bed.   States he is significantly improved overall since admission and feels less "pelvic pressure" after PCN placement yesterday.  Clear, yellow urine in L PCN.  Blood-tinged UOP from foley.   Allergies: Xarelto  [rivaroxaban ]  Medications: Prior to Admission medications   Medication Sig Start Date End Date Taking? Authorizing Provider  amLODipine  (NORVASC ) 5 MG tablet TAKE 1 TABLET (5 MG TOTAL) BY MOUTH DAILY 12/05/23  Yes Eilleen Grates, MD  Cholecalciferol (VITAMIN D3) 1000 units CAPS Take 1,000 Units by mouth daily.   Yes [provider]  furosemide  (LASIX ) 20 MG tablet Take 1 tablet (20 mg total) by mouth daily. For the next 3 days take 40 mg (2 tabs) daily then resume one tab daily. 02/15/24  Yes Eilleen Grates, MD  isosorbide  mononitrate (IMDUR ) 30 MG 24 hr tablet TAKE ONE TABLET BY MOUTH DAILY 08/24/23  Yes Graig Lawyer, MD  JANUVIA  25 MG tablet TAKE ONE TABLET BY MOUTH DAILY 01/23/24  Yes Graig Lawyer, MD  metoprolol  tartrate (LOPRESSOR ) 25 MG tablet TAKE ONE TABLET BY MOUTH TWICE A DAY 06/11/23  Yes Eilleen Grates, MD  olmesartan  (BENICAR ) 40 MG tablet Take 40 mg by mouth daily.   Yes [provider]  phenazopyridine  (PYRIDIUM ) 200 MG tablet Take 1 tablet (200 mg total) by mouth 3 (three) times daily as needed for pain. 12/28/23  Yes RuddMalcolm Scrivener, MD  rosuvastatin  (CRESTOR ) 40 MG tablet TAKE 1 TABLET (40 MG TOTAL) BY MOUTH EVERY MORNING. PLEASE KEEP SCHEDULED APPOINTMENT 08/24/23  Yes Eilleen Grates, MD  sodium bicarbonate 650 MG tablet Take 650 mg by mouth 2 (two) times daily.   Yes [provider]  tamsulosin  (FLOMAX ) 0.4 MG CAPS capsule TAKE ONE CAPSULE BY MOUTH DAILY 02/22/24  Yes Graig Lawyer, MD  clopidogrel  (PLAVIX ) 75 MG tablet Take 1 tablet  (75 mg total) by mouth daily. 11/05/23   McDaniel, Jill D, NP  hydrOXYzine  (VISTARIL ) 25 MG capsule Take 1 capsule (25 mg total) by mouth every 8 (eight) hours as needed. 05/11/23   Graig Lawyer, MD  nitrofurantoin , macrocrystal-monohydrate, (MACROBID ) 100 MG capsule Take 1 capsule (100 mg total) by mouth 2 (two) times daily. 01/31/24   Boscia, Heather E, NP  nitroGLYCERIN  (NITROSTAT ) 0.4 MG SL tablet TAKE ONE TABLET UNDER THE TONGUE EVERY FIVE MINUTES FOR THREE DOSES AS NEEDED FOR CHEST PAIN, CALL 911 IF 2ND DOSE DOESN'T HELP 11/10/22   Eilleen Grates, MD  oxybutynin  (DITROPAN  XL) 15 MG 24 hr tablet Take 1 tablet (15 mg total) by mouth 2 (two) times daily as needed. 12/19/23   Boscia, Heather E, NP  Pembrolizumab  (KEYTRUDA  IV) Inject into the vein every 14 (fourteen) days.    [provider]  traZODone  (DESYREL ) 50 MG tablet Take 0.5-1 tablets (25-50 mg total) by mouth at bedtime as needed for sleep. 10/16/23   Graig Lawyer, MD     Vital Signs: BP 124/76   Pulse 76   Temp 97.6 F (36.4 C) (Axillary)   Resp 18   Ht 5\' 11"  (1.803 m)   Wt 217 lb 9.5 oz (98.7 kg)   SpO2 96%   BMI 30.35 kg/m   Physical Exam Vitals and nursing note reviewed.  Constitutional:      General: He is not in acute distress.    Appearance: Normal  appearance. He is not ill-appearing.  Neurological:     Mental Status: He is alert.   L PCN in place.  Insertion site clean and dry.  Clear, yelllow urine in L PCN.  Blood-tinged urine in Foley bag.   Imaging: IR NEPHROSTOMY PLACEMENT LEFT Result Date: 03/07/2024 INDICATION: 78 year old male with complex medical history. In short, he has had prior right nephro ureterectomy and now has severe hemorrhagic cystitis of unclear etiology. Due to persistent blood loss he presents for urinary diversion on the left. EXAM: IR NEPHROSTOMY PLACEMENT LEFT COMPARISON:  None Available. MEDICATIONS: 2 g Rocephin ; The antibiotic was administered in an appropriate time frame  prior to skin puncture. ANESTHESIA/SEDATION: 1 mg Versed  administered intravenously for anxiolysis. CONTRAST:  20mL OMNIPAQUE  IOHEXOL  300 MG/ML SOLN - administered into the collecting system(s) FLUOROSCOPY: Radiation exposure index: 15 mGy COMPLICATIONS: None immediate. TECHNIQUE: The procedure, risks, benefits, and alternatives were explained to the patient. Questions regarding the procedure were encouraged and answered. The patient understands and consents to the procedure. The left flank was prepped with chlorhexidine  in a sterile fashion, and a sterile drape was applied covering the operative field. A sterile gown and sterile gloves were used for the procedure. Local anesthesia was provided with 1% Lidocaine . The left flank was interrogated with ultrasound and the left kidney identified. The kidney is hydronephrotic. A suitable access site on the skin overlying the lower pole, posterior calix was identified. After local mg anesthesia was achieved, a small skin nick was made with an 11 blade scalpel. A 21 gauge Accustick needle was then advanced under direct sonographic guidance into the lower pole of the left kidney. A 0.018 inch wire was advanced under fluoroscopic guidance into the left renal collecting system. The Accustick sheath was then advanced over the wire and a 0.018 system exchanged for a 0.035 system. Gentle hand injection of contrast material confirms placement of the sheath within the renal collecting system. There is mild hydronephrosis and a tortuous ureter. The tract from the scan into the renal collecting system was then dilated serially to 10-French. A 10-French Cook all-purpose drain was then placed and positioned under fluoroscopic guidance. The locking loop is well formed within the left renal pelvis. The catheter was secured to the skin with 2-0 Prolene and a sterile bandage was placed. Catheter was left to gravity bag drainage. IMPRESSION: Successful placement of a left 10 French  percutaneous nephrostomy tube. Electronically Signed   By: Fernando Hoyer M.D.   On: 03/07/2024 14:59    Labs:  CBC: Recent Labs    01/10/24 1315 01/31/24 1237 02/21/24 1255 03/06/24 1746 03/07/24 1302 03/07/24 1754 03/08/24 0243 03/08/24 0737  WBC 11.6* 10.5 13.9*  --   --   --   --  20.6*  HGB 13.4 13.5 13.5   < > 9.2* 9.4* 7.7* 8.0*  HCT 41.5 41.1 40.9   < > 29.0* 29.3* 23.9* 25.4*  PLT 190 185 220  --   --   --   --  152   < > = values in this interval not displayed.    COAGS: Recent Labs    12/23/23 0025 03/07/24 0806  INR 1.4* 1.6*  APTT 60* 41*    BMP: Recent Labs    02/21/24 1255 03/07/24 0306 03/07/24 1526 03/08/24 0243  NA 136 134* 135 136  K 4.4 5.2* 4.9 5.0  CL 107 107 105 107  CO2 26 21* 18* 21*  GLUCOSE 206* 221* 169* 151*  BUN 42* 37*  50* 57*  CALCIUM  9.0 7.6* 7.9* 7.8*  CREATININE 1.88* 2.32* 2.62* 2.85*  GFRNONAA 36* 28* 24* 22*    LIVER FUNCTION TESTS: Recent Labs    12/22/23 1924 01/10/24 1315 01/31/24 1237 02/21/24 1255  BILITOT 2.4* 2.5* 2.7* 2.0*  AST 21 17 17  12*  ALT 14 11 11 11   ALKPHOS 115 112 105 125  PROT 7.4 7.6 7.6 7.5  ALBUMIN  3.2* 3.7 3.8 3.6    Assessment and Plan: Hemorrhagic cystitis with compromised output in the setting of 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  Patient s/p L PCN placement yesterday by Dr. Marne Sings.  He is stable upon assessment today.  CLear, yellow urine in PCN.  Blood-tinged urine from foley which appears to be improving.  Further plans per Urology.  IR remains available.   Electronically Signed: Nesbit Michon Sue-Ellen Kamisha Ell, PA 03/08/2024, 2:01 PM   I spent a total of 15 Minutes at the the patient's bedside AND on the patient's hospital floor or unit, greater than 50% of which was counseling/coordinating care for hemorrhagic cystitis.

## 2024-03-08 NOTE — Progress Notes (Signed)
 Urology Inpatient Progress Note  Subjective: POD 2 s/p cystoscopy with transurethral bladder biopsies.  Status post placement of left nephrostomy tube yesterday. CBI has been clamped since yesterday.  He has had some bloody urine which has cleared with intermittent bladder irrigation.  Nephrostomy tube output has been clear. No pain.  He did have an episode of hypotension early this morning which responded to a fluid bolus. Hemoglobin 7.7 this morning. Creatinine increased to 2.85.  Anti-infectives: Anti-infectives (From admission, onward)    Start     Dose/Rate Route Frequency Ordered Stop   03/07/24 1115  cefTRIAXone  (ROCEPHIN ) 2 g in sodium chloride  0.9 % 100 mL IVPB       Note to Pharmacy: Administer in IR   2 g 200 mL/hr over 30 Minutes Intravenous  Once 03/07/24 1024 03/07/24 1230   03/06/24 2200  ciprofloxacin  (CIPRO ) IVPB 400 mg        400 mg 200 mL/hr over 60 Minutes Intravenous Every 12 hours 03/06/24 2036 03/07/24 1146   03/06/24 1125  ceFAZolin  (ANCEF ) IVPB 2g/100 mL premix        2 g 200 mL/hr over 30 Minutes Intravenous 30 min pre-op 03/06/24 1125 03/06/24 1227       Current Facility-Administered Medications  Medication Dose Route Frequency Provider Last Rate Last Admin   0.9 %  sodium chloride  infusion  250 mL Intravenous PRN Florencio Hunting, MD 10 mL/hr at 03/08/24 0044 Infusion Verify at 03/08/24 0044   Chlorhexidine  Gluconate Cloth 2 % PADS 6 each  6 each Topical Daily Florencio Hunting, MD   6 each at 03/07/24 1110   cholecalciferol (VITAMIN D3) 25 MCG (1000 UNIT) tablet 1,000 Units  1,000 Units Oral Daily Florencio Hunting, MD   1,000 Units at 03/08/24 0900   diphenhydrAMINE  (BENADRYL ) injection 12.5 mg  12.5 mg Intravenous Q6H PRN Florencio Hunting, MD       Or   diphenhydrAMINE  (BENADRYL ) 12.5 MG/5ML elixir 12.5 mg  12.5 mg Oral Q6H PRN Florencio Hunting, MD       docusate sodium  (COLACE) capsule 100 mg  100 mg Oral BID Florencio Hunting, MD   100 mg at 03/08/24 0901    HYDROcodone -acetaminophen  (NORCO/VICODIN) 5-325 MG per tablet 1-2 tablet  1-2 tablet Oral Q4H PRN Florencio Hunting, MD   1 tablet at 03/07/24 2102   hyoscyamine  (LEVSIN SL) SL tablet 0.125 mg  0.125 mg Sublingual Q6H PRN Florencio Hunting, MD       insulin  aspart (novoLOG ) injection 0-9 Units  0-9 Units Subcutaneous Q4H Payne, John D, PA-C   3 Units at 03/08/24 8295   metoprolol  tartrate (LOPRESSOR ) tablet 25 mg  25 mg Oral BID Payne, John D, PA-C   25 mg at 03/08/24 0900   nitroGLYCERIN  (NITROSTAT ) SL tablet 0.4 mg  0.4 mg Sublingual Q5 Min x 3 PRN Florencio Hunting, MD       ondansetron  (ZOFRAN ) injection 4 mg  4 mg Intravenous Q4H PRN Florencio Hunting, MD       rosuvastatin  (CRESTOR ) tablet 40 mg  40 mg Oral Daily Florencio Hunting, MD   40 mg at 03/08/24 0900   sodium chloride  flush (NS) 0.9 % injection 3 mL  3 mL Intravenous Q12H Borden, Lester, MD   3 mL at 03/07/24 2103   sodium chloride  flush (NS) 0.9 % injection 3 mL  3 mL Intravenous PRN Florencio Hunting, MD       sodium chloride  flush (NS) 0.9 % injection 3-10 mL  3-10 mL Intravenous Q12H Borden,  Julietta Ogren, MD   10 mL at 03/07/24 2103   sodium chloride  flush (NS) 0.9 % injection 3-10 mL  3-10 mL Intravenous PRN Florencio Hunting, MD       tamsulosin  (FLOMAX ) capsule 0.4 mg  0.4 mg Oral Daily Florencio Hunting, MD   0.4 mg at 03/08/24 0900   traZODone  (DESYREL ) tablet 25-50 mg  25-50 mg Oral QHS PRN Florencio Hunting, MD         Objective: Vital signs in last 24 hours: Temp:  [96.3 F (35.7 C)-98.3 F (36.8 C)] 98.1 F (36.7 C) (04/26 0428) Pulse Rate:  [41-103] 68 (04/26 0600) Resp:  [10-26] 14 (04/26 0600) BP: (76-125)/(46-95) 113/72 (04/26 0600) SpO2:  [82 %-100 %] 98 % (04/26 0600)  Intake/Output from previous day: 04/25 0701 - 04/26 0700 In: 18131.7 [P.O.:240; I.V.:306.3; Blood:676; IV Piggyback:1829.4] Out: 91478 [Urine:17360] Intake/Output this shift: Total I/O In: -  Out: 350 [Urine:350]  GENERAL APPEARANCE:  Well appearing, well  developed, well nourished, NAD HEENT:  Atraumatic, normocephalic, oropharynx clear ABDOMEN:  Soft, non-tender, no masses EXTREMITIES:  Moves all extremities well, without clubbing, cyanosis, or edema NEUROLOGIC:  Alert and oriented x 3,  CN II-XII grossly intact MENTAL STATUS:  appropriate BACK:  Non-tender to palpation, No CVAT; left nephrostomy tube draining clear urine SKIN:  Warm, dry, and intact GU:  foley with dark blood in tube, no clots, urine cleared with 1-2 minutes of CBI.  Lab Results:  Recent Labs    03/08/24 0243 03/08/24 0737  WBC  --  20.6*  HGB 7.7* 8.0*  HCT 23.9* 25.4*  PLT  --  152   BMET Recent Labs    03/07/24 1526 03/08/24 0243  NA 135 136  K 4.9 5.0  CL 105 107  CO2 18* 21*  GLUCOSE 169* 151*  BUN 50* 57*  CREATININE 2.62* 2.85*  CALCIUM  7.9* 7.8*   PT/INR Recent Labs    03/07/24 0806  LABPROT 19.7*  INR 1.6*   ABG No results for input(s): "PHART", "HCO3" in the last 72 hours.  Invalid input(s): "PCO2", "PO2"  Studies/Results: IR NEPHROSTOMY PLACEMENT LEFT Result Date: 03/07/2024 INDICATION: 78 year old male with complex medical history. In short, he has had prior right nephro ureterectomy and now has severe hemorrhagic cystitis of unclear etiology. Due to persistent blood loss he presents for urinary diversion on the left. EXAM: IR NEPHROSTOMY PLACEMENT LEFT COMPARISON:  None Available. MEDICATIONS: 2 g Rocephin ; The antibiotic was administered in an appropriate time frame prior to skin puncture. ANESTHESIA/SEDATION: 1 mg Versed  administered intravenously for anxiolysis. CONTRAST:  20mL OMNIPAQUE  IOHEXOL  300 MG/ML SOLN - administered into the collecting system(s) FLUOROSCOPY: Radiation exposure index: 15 mGy COMPLICATIONS: None immediate. TECHNIQUE: The procedure, risks, benefits, and alternatives were explained to the patient. Questions regarding the procedure were encouraged and answered. The patient understands and consents to the procedure.  The left flank was prepped with chlorhexidine  in a sterile fashion, and a sterile drape was applied covering the operative field. A sterile gown and sterile gloves were used for the procedure. Local anesthesia was provided with 1% Lidocaine . The left flank was interrogated with ultrasound and the left kidney identified. The kidney is hydronephrotic. A suitable access site on the skin overlying the lower pole, posterior calix was identified. After local mg anesthesia was achieved, a small skin nick was made with an 11 blade scalpel. A 21 gauge Accustick needle was then advanced under direct sonographic guidance into the lower pole of the left kidney. A 0.018 inch  wire was advanced under fluoroscopic guidance into the left renal collecting system. The Accustick sheath was then advanced over the wire and a 0.018 system exchanged for a 0.035 system. Gentle hand injection of contrast material confirms placement of the sheath within the renal collecting system. There is mild hydronephrosis and a tortuous ureter. The tract from the scan into the renal collecting system was then dilated serially to 10-French. A 10-French Cook all-purpose drain was then placed and positioned under fluoroscopic guidance. The locking loop is well formed within the left renal pelvis. The catheter was secured to the skin with 2-0 Prolene and a sterile bandage was placed. Catheter was left to gravity bag drainage. IMPRESSION: Successful placement of a left 10 French percutaneous nephrostomy tube. Electronically Signed   By: Fernando Hoyer M.D.   On: 03/07/2024 14:59     Assessment & Plan: POD #2 s/p cystoscopy, urethral dilation, bladder biopsies and fulguration.  1) Hemorrhagic cystitis -bleeding has decreased significantly.  Urine clears quickly with bladder irrigation.  Continue Foley catheter.  Irrigate bladder as needed.  2) CKD with AKI -creatinine increased slightly today.  This may be prerenal.  Nephrostomy tube appears to be  draining well.  Will continue to monitor.  3) CAD/atrial fibrillation -currently stable.  Rate has been controlled.  4) Bladder cancer -pathology showed chronic inflammation without evidence of cancer.  5) Anemia - Hgb decrease this AM.  Discussed with ICU team.  Will plan on transfusing to keep Hgb > 8.  Will continue to monitor him in the ICU. Management discussed with intensive care team.  Appreciate their excellent care.  Oda Bence, MD 03/08/2024

## 2024-03-08 NOTE — Progress Notes (Signed)
 Adequate rate control and BP currently, getting his metoprolol  now Getting a break from his bladder irrigation, following CBC  Will follow peripherally to insure no changes.   Racheal Buddle, MD, PhD 03/08/2024, 9:26 AM [facility] [phone] or if no answer before 7:00PM call [phone] For any issues after 7:00PM please call eLink [phone]

## 2024-03-08 NOTE — Plan of Care (Signed)
  Problem: Clinical Measurements: Goal: Ability to maintain clinical measurements within normal limits will improve Outcome: Progressing Goal: Diagnostic test results will improve Outcome: Progressing Goal: Cardiovascular complication will be avoided Outcome: Progressing   Problem: Activity: Goal: Risk for activity intolerance will decrease Outcome: Progressing Note: Ambulated to the bathroom and had first BM today    Problem: Nutrition: Goal: Adequate nutrition will be maintained Outcome: Progressing   Problem: Coping: Goal: Level of anxiety will decrease Outcome: Progressing   Problem: Elimination: Goal: Will not experience complications related to bowel motility Outcome: Progressing Goal: Will not experience complications related to urinary retention Outcome: Progressing Note: No clots in foley catheter and nephrostomy tube was yellow/straw color   Problem: Urinary Elimination: Goal: Ability to avoid or minimize complications of infection will improve Outcome: Progressing

## 2024-03-09 DIAGNOSIS — R310 Gross hematuria: Secondary | ICD-10-CM | POA: Diagnosis not present

## 2024-03-09 DIAGNOSIS — N179 Acute kidney failure, unspecified: Secondary | ICD-10-CM | POA: Diagnosis not present

## 2024-03-09 DIAGNOSIS — D62 Acute posthemorrhagic anemia: Secondary | ICD-10-CM | POA: Diagnosis not present

## 2024-03-09 DIAGNOSIS — C679 Malignant neoplasm of bladder, unspecified: Secondary | ICD-10-CM | POA: Diagnosis not present

## 2024-03-09 DIAGNOSIS — R000 Tachycardia, unspecified: Secondary | ICD-10-CM

## 2024-03-09 LAB — CBC
HCT: 26.8 % — ABNORMAL LOW (ref 39.0–52.0)
Hemoglobin: 8.5 g/dL — ABNORMAL LOW (ref 13.0–17.0)
MCH: 27.7 pg (ref 26.0–34.0)
MCHC: 31.7 g/dL (ref 30.0–36.0)
MCV: 87.3 fL (ref 80.0–100.0)
Platelets: 126 10*3/uL — ABNORMAL LOW (ref 150–400)
RBC: 3.07 MIL/uL — ABNORMAL LOW (ref 4.22–5.81)
RDW: 16.7 % — ABNORMAL HIGH (ref 11.5–15.5)
WBC: 13.6 10*3/uL — ABNORMAL HIGH (ref 4.0–10.5)
nRBC: 0 % (ref 0.0–0.2)

## 2024-03-09 LAB — BASIC METABOLIC PANEL WITH GFR
Anion gap: 7 (ref 5–15)
BUN: 43 mg/dL — ABNORMAL HIGH (ref 8–23)
CO2: 23 mmol/L (ref 22–32)
Calcium: 8 mg/dL — ABNORMAL LOW (ref 8.9–10.3)
Chloride: 109 mmol/L (ref 98–111)
Creatinine, Ser: 2.32 mg/dL — ABNORMAL HIGH (ref 0.61–1.24)
GFR, Estimated: 28 mL/min — ABNORMAL LOW
Glucose, Bld: 141 mg/dL — ABNORMAL HIGH (ref 70–99)
Potassium: 4.3 mmol/L (ref 3.5–5.1)
Sodium: 139 mmol/L (ref 135–145)

## 2024-03-09 LAB — GLUCOSE, CAPILLARY
Glucose-Capillary: 135 mg/dL — ABNORMAL HIGH (ref 70–99)
Glucose-Capillary: 138 mg/dL — ABNORMAL HIGH (ref 70–99)
Glucose-Capillary: 142 mg/dL — ABNORMAL HIGH (ref 70–99)
Glucose-Capillary: 148 mg/dL — ABNORMAL HIGH (ref 70–99)
Glucose-Capillary: 160 mg/dL — ABNORMAL HIGH (ref 70–99)
Glucose-Capillary: 213 mg/dL — ABNORMAL HIGH (ref 70–99)

## 2024-03-09 MED ORDER — INSULIN ASPART 100 UNIT/ML IJ SOLN
0.0000 [IU] | Freq: Three times a day (TID) | INTRAMUSCULAR | Status: DC
  Administered 2024-03-10: 2 [IU] via SUBCUTANEOUS
  Administered 2024-03-10 (×2): 1 [IU] via SUBCUTANEOUS
  Administered 2024-03-11: 2 [IU] via SUBCUTANEOUS
  Administered 2024-03-11: 1 [IU] via SUBCUTANEOUS
  Administered 2024-03-11: 2 [IU] via SUBCUTANEOUS
  Administered 2024-03-12: 1 [IU] via SUBCUTANEOUS
  Administered 2024-03-12 (×2): 2 [IU] via SUBCUTANEOUS
  Administered 2024-03-13: 1 [IU] via SUBCUTANEOUS

## 2024-03-09 MED ORDER — ORAL CARE MOUTH RINSE: 15.0000 mL | OROMUCOSAL | Status: DC | PRN

## 2024-03-09 MED ORDER — INSULIN ASPART 100 UNIT/ML IJ SOLN
0.0000 [IU] | Freq: Every day | INTRAMUSCULAR | Status: DC
  Administered 2024-03-09 – 2024-03-10 (×2): 2 [IU] via SUBCUTANEOUS

## 2024-03-09 NOTE — Progress Notes (Signed)
 Patient had a 14 beat run of v-tach non-sustained, asymptomatic. Dr. Hester Lot notified and aware.

## 2024-03-09 NOTE — Plan of Care (Signed)
  Problem: Nutrition: Goal: Adequate nutrition will be maintained Outcome: Progressing   Problem: Elimination: Goal: Will not experience complications related to bowel motility Outcome: Progressing Goal: Will not experience complications related to urinary retention Outcome: Progressing   Problem: Safety: Goal: Ability to remain free from injury will improve Outcome: Progressing   

## 2024-03-09 NOTE — Consult Note (Signed)
 Initial Consultation Note    Patient: Adam Harris YNW:[id number] DOB: 1946/10/24 PCP: Graig Lawyer, MD DOA: 03/06/2024 DOS: the patient was seen and examined on 03/09/2024 Primary service: Florencio Hunting, MD  Referring physician: Dr Oda Bence Reason for consult: Medical management   Assessment and Plan:  Possible tachyarrhythmia w/ wide complex Hx of PAF s/p watchman on 11/2023: Currently off Eliquis , only on Plavix  Currently heart rate controlled, in Afib Continue metoprolol  twice daily Continue to hold Plavix  Telemetry  Acute blood loss anemia 2/2 hematuria/hemorrhagic cystitis Hemoglobin dropped from 13 to 8.7 Anemia panel pending S/p bladder irrigation, fulguration, now hematuria improving Continue Foley, draining light pinkish urine Daily CBC  Leukocytosis Mild thrombocytopenia Likely reactive Daily CBC  AKI on CKD IV Creatinine improving, baseline around 2.1 Daily BMP Avoid nephrotoxic agents, ensure adequate renal perfusion  Diabetes mellitus type 2 A1c 6.8 SSI, Accu-Cheks, hypoglycemic protocol   HTN CAD Chronic diastolic chf BP stable Continue metoprolol  as above, hold home amlodipine , Lasix , Imdur , Benicar  Cont statin   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      HPI: Adam Harris is a 78 y.o. male with past medical history of chronic A-fib status post Watchman procedure on 11/2023, CAD, CKD stage IV, CHF, HTN, DM2, bladder CA, was admitted by urology service for  cystoscopy, dilatation of urethral stricture, transurethral resection of bladder tumor on 03/06/2024 for persistent hematuria and bladder biopsy.  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  Patient subsequently has been receiving IV pembrolizumab  and appears to have recurrent disease with a suspicious cytology hence the reason for admission for cystoscopy and biopsy of the bladder for confirmatory purposes.  Patient had been on Eliquis  for chronic A-fib but has since been off Eliquis  status post Watchman procedure and remained on Plavix .  Postop the above procedure on 4/24, patient noted to have significant hematuria.  Patient was started on bladder irrigation, underwent fulguration but with continued bleeding subsequently drop in his hemoglobin from 13-8.7.  On 4/25, a left nephrostomy tube was placed to help divert urine.  Shortly afterwards patient noted to be progressively tachycardic but otherwise stable.  Due to the worsening tachyarrhythmia, PCCM was consulted.  Patient was further stabilized.  PCCM/urology requesting [facility] hospitalist to consult on this patient for medical management.    Today,   Review of Systems: As mentioned in the history of present illness. All other systems reviewed and are negative. Past Medical History:  Diagnosis Date   Abnormal radiologic findings on diagnostic imaging of renal pelvis, ureter, or bladder    bilateral ureter abnormalities   Anticoagulant long-term use    eliquis    Anxiety    pt denies   Arthritis    Atrial fibrillation, chronic (HCC)    CAD (coronary artery disease) cardiologist-- dr hochrein   NSTEMI 02-04-2014  per cardiac cath chronic occluded RCA w/ faint left-to-right collaterals and aneurysmal LCFx with sluggish coronary flow/  NSTEMI --11-21-2016 per cardiac cath occluded  proximal RCA & mid to diastal CFX 100%, med rx. If that does not work, PTCA or CABG   Cancer [facility])    bladder and kidney   CHF (congestive heart failure) (HCC)    CKD (chronic kidney disease), stage III (HCC)    patient unaware   DOE (dyspnea on exertion)    Fatty liver    pt denies   Hematuria 02/2019   History of COVID-19 10/2019   History of non-ST elevation myocardial infarction (NSTEMI)    02-04-2014  and 11-21-2016  cardiac cath done both times ,  medically management   History of shingles 12/2017   slight pain and numbness still noted in the area   Hyperlipidemia    Hypertension    Insomnia    Myocardial infarction [facility]) 2015   Persistent atrial fibrillation [facility])    cardiologsit-- dr hochrein   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 Daneil Dunker   Thoracic aortic atherosclerosis (HCC)    Type 2 diabetes mellitus (HCC)    Urinary frequency    Past Surgical History:  Procedure Laterality Date   CARDIAC CATHETERIZATION N/A 11/21/2016   Procedure: Left Heart Cath and Coronary Angiography;  Surgeon: Millicent Ally, MD;  Location: [facility] INVASIVE CV LAB;  Service: Cardiovascular;  Laterality: N/A;  pRCA 100% , ostial LAD 45%, OM3 80%, mCFX to dCFX 100% (AV groove), lateral OM3 50%   COLONOSCOPY     CYSTOSCOPY WITH BIOPSY N/A 08/12/2020   Procedure: CYSTOSCOPY WITH BLADDER BIOPSY AND TRANSURETHRAL RESECTION OF BLADDER TUMOR;  Surgeon: Florencio Hunting, MD;  Location: [facility] ORS;  Service: Urology;  Laterality: N/A;   CYSTOSCOPY WITH BIOPSY N/A 11/10/2021   Procedure: CYSTOSCOPY WITH BLADDER BIOPSIES/ LEFT RETROGRADE;  Surgeon: Florencio Hunting, MD;  Location: [facility] ORS;  Service: Urology;  Laterality: N/A;   CYSTOSCOPY WITH BIOPSY Left 07/27/2022   Procedure: CYSTOSCOPY WITH  BLADDER BIOPSY, TRANSURETHRAL FULGERATION OF BLADDER, EXAM UNDER ANESTHESIA, LEFT RETROGRADE PYELOGRAM;  Surgeon: Florencio Hunting, MD;  Location: [facility] ORS;   Service: Urology;  Laterality: Left;   CYSTOSCOPY WITH BIOPSY N/A 04/23/2023   Procedure: CYSTOSCOPY WITH BLADDER AND PROSTATIC URETHRAL BIOPSIES;  Surgeon: Florencio Hunting, MD;  Location: [facility] ORS;  Service: Urology;  Laterality: N/A;  60 MINUTES NEEDED FOR CASE   CYSTOSCOPY WITH BIOPSY N/A 03/06/2024   Procedure: CYSTOSCOPY, WITH BIOPSY;  Surgeon: Florencio Hunting, MD;  Location: [facility];  Service: Urology;  Laterality: N/A;  CYSTOSCOPY WITH BLADDER BIOPSIES   CYSTOSCOPY WITH FULGERATION N/A 03/06/2024   Procedure: CYSTOSCOPY, WITH BLADDER FULGURATION, TURBT;  Surgeon: Florencio Hunting, MD;  Location: [facility];  Service: Urology;  Laterality: N/A;   CYSTOSCOPY WITH URETEROSCOPY AND STENT PLACEMENT Right 12/15/2019   Procedure: CYSTOSCOPY WITH RIGHT RETROGRADE/ RIGHT URETEROSCOPY/ BIOPSY;  Surgeon: Ottelin, Mark, MD;  Location[facility];  Service: Urology;  Laterality: Right;   CYSTOSCOPY/RETROGRADE/URETEROSCOPY Bilateral 03/18/2018   Procedure: CYSTOSCOPY/RETROGRADE/URETEROSCOPY.;  Surgeon: Ottelin, Mark, MD;  Location: [facility];  Service: Urology;  Laterality: Bilateral;   EYE SURGERY Bilateral    cateract in January 2023   HYDROCELE EXCISION Left 07/27/2022   Procedure: HYDROCELE REPAIR;  Surgeon: Florencio Hunting, MD;  Location: [facility];  Service: Urology;  Laterality: Left;  GENERAL ANESTHESIA WITH PARALYSIS   IR NEPHROSTOMY PLACEMENT LEFT  03/07/2024   KNEE ARTHROSCOPY Right    LEFT ATRIAL APPENDAGE OCCLUSION N/A 12/13/2023  Procedure: LEFT ATRIAL APPENDAGE OCCLUSION;  Surgeon: Ardeen Kohler, MD;  Location: [facility] INVASIVE CV LAB;  Service: Cardiovascular;  Laterality: N/A;   LEFT HEART CATHETERIZATION WITH CORONARY ANGIOGRAM N/A 02/04/2014   Procedure: LEFT HEART CATHETERIZATION WITH CORONARY ANGIOGRAM;  Surgeon: Wenona Hamilton, MD;  Location: [facility] CATH LAB;  Service: Cardiovascular;  Laterality: N/A;  severe one-vessel CAD, chronically occluded RCA with faint left-to-right  collaterals;  aneurysmal LCFx with sluggish coronary flow;  normal LVSF w/ moderately elevated LVEDP (ostialOM2 20%, pOM3 20%, pD1 20%, mCFX 50%, diffuse 20% pCFX)   PROSTATE BIOPSY N/A 08/12/2020   Procedure: BIOPSY TRANSRECTAL ULTRASONIC PROSTATE (TUBP);  Surgeon: Florencio Hunting, MD;  Location: [facility];  Service: Urology;  Laterality: N/A;   ROBOT ASSITED LAPAROSCOPIC NEPHROURETERECTOMY Right 03/22/2020   Procedure: XI ROBOT ASSITED LAPAROSCOPIC NEPHROURETERECTOMY;  Surgeon: Florencio Hunting, MD;  Location: [facility];  Service: Urology;  Laterality: Right;   TRANSESOPHAGEAL ECHOCARDIOGRAM (CATH LAB) N/A 12/13/2023   Procedure: TRANSESOPHAGEAL ECHOCARDIOGRAM;  Surgeon: Ardeen Kohler, MD;  Location: [facility] INVASIVE CV LAB;  Service: Cardiovascular;  Laterality: N/A;   TRANSESOPHAGEAL ECHOCARDIOGRAM (CATH LAB) N/A 02/11/2024   Procedure: TRANSESOPHAGEAL ECHOCARDIOGRAM;  Surgeon: Harrold Lincoln, MD;  Location: [facility] INVASIVE CV LAB;  Service: Cardiovascular;  Laterality: N/A;   TRANSTHORACIC ECHOCARDIOGRAM  11-22-2016   dr hochrein   ef 55-60%/ mild MR and TR/ moderate LAE   TRANSURETHRAL RESECTION OF BLADDER TUMOR N/A 02/10/2021   Procedure: TRANSURETHRAL RESECTION OF BLADDER TUMOR (TURBT)/ CYSTOSCOPY/ LEFT RETROGRADE;  Surgeon: Florencio Hunting, MD;  Location: [facility];  Service: Urology;  Laterality: N/A;  GENERAL ANESTHESIA WITH PARALYSIS   Social History:  reports that he quit smoking about 53 years ago. His smoking use included cigarettes. He started smoking about 57 years ago. He has a 1 pack-year smoking history. He has been exposed to tobacco smoke. He quit smokeless tobacco use about 53 years ago.  His smokeless tobacco use included chew. He reports that he does not drink alcohol and does not use drugs.  Allergies  Allergen Reactions   Xarelto  [Rivaroxaban ] Other (See Comments)    Skin Blisters     Family History  Problem Relation Age of Onset   Hypertension Mother    Cancer Mother        anal  cancer   Esophageal cancer Neg Hx    Pancreatic cancer Neg Hx    Stomach cancer Neg Hx    Liver disease Neg Hx     Prior to Admission medications   Medication Sig Start Date End Date Taking? Authorizing Provider  amLODipine  (NORVASC ) 5 MG tablet TAKE 1 TABLET (5 MG TOTAL) BY MOUTH DAILY 12/05/23  Yes Eilleen Grates, MD  Cholecalciferol (VITAMIN D3) 1000 units CAPS Take 1,000 Units by mouth daily.   Yes [provider]  furosemide  (LASIX ) 20 MG tablet Take 1 tablet (20 mg total) by mouth daily. For the next 3 days take 40 mg (2 tabs) daily then resume one tab daily. 02/15/24  Yes Eilleen Grates, MD  isosorbide  mononitrate (IMDUR ) 30 MG 24 hr tablet TAKE ONE TABLET BY MOUTH DAILY 08/24/23  Yes Graig Lawyer, MD  JANUVIA  25 MG tablet TAKE ONE TABLET BY MOUTH DAILY 01/23/24  Yes Graig Lawyer, MD  metoprolol  tartrate (LOPRESSOR ) 25 MG tablet TAKE ONE TABLET BY MOUTH TWICE A DAY 06/11/23  Yes Eilleen Grates, MD  olmesartan  (BENICAR ) 40 MG tablet Take 40 mg by mouth daily.   Yes [provider]  phenazopyridine  (PYRIDIUM ) 200  MG tablet Take 1 tablet (200 mg total) by mouth 3 (three) times daily as needed for pain. 12/28/23  Yes Graig Lawyer, MD  rosuvastatin  (CRESTOR ) 40 MG tablet TAKE 1 TABLET (40 MG TOTAL) BY MOUTH EVERY MORNING. PLEASE KEEP SCHEDULED APPOINTMENT 08/24/23  Yes Eilleen Grates, MD  sodium bicarbonate 650 MG tablet Take 650 mg by mouth 2 (two) times daily.   Yes [provider]  tamsulosin  (FLOMAX ) 0.4 MG CAPS capsule TAKE ONE CAPSULE BY MOUTH DAILY 02/22/24  Yes Graig Lawyer, MD  clopidogrel  (PLAVIX ) 75 MG tablet Take 1 tablet (75 mg total) by mouth daily. 11/05/23   McDaniel, Jill D, NP  hydrOXYzine  (VISTARIL ) 25 MG capsule Take 1 capsule (25 mg total) by mouth every 8 (eight) hours as needed. 05/11/23   Graig Lawyer, MD  nitrofurantoin , macrocrystal-monohydrate, (MACROBID ) 100 MG capsule Take 1 capsule (100 mg total) by mouth 2 (two) times daily.  01/31/24   Boscia, Heather E, NP  nitroGLYCERIN  (NITROSTAT ) 0.4 MG SL tablet TAKE ONE TABLET UNDER THE TONGUE EVERY FIVE MINUTES FOR THREE DOSES AS NEEDED FOR CHEST PAIN, CALL 911 IF 2ND DOSE DOESN'T HELP 11/10/22   Eilleen Grates, MD  oxybutynin  (DITROPAN  XL) 15 MG 24 hr tablet Take 1 tablet (15 mg total) by mouth 2 (two) times daily as needed. 12/19/23   Boscia, Heather E, NP  Pembrolizumab  (KEYTRUDA  IV) Inject into the vein every 14 (fourteen) days.    [provider]  traZODone  (DESYREL ) 50 MG tablet Take 0.5-1 tablets (25-50 mg total) by mouth at bedtime as needed for sleep. 10/16/23   Graig Lawyer, MD    Physical Exam: Vitals:   03/09/24 0800 03/09/24 0836 03/09/24 1157 03/09/24 1200  BP: 132/88   132/87  Pulse: 68 68  63  Resp: 20 17  (!) 23  Temp: 97.6 F (36.4 C)  (!) 97.5 F (36.4 C)   TempSrc: Oral  Oral   SpO2: 96% 98%  94%  Weight:      Height:       General: NAD  Cardiovascular: S1, S2 present Respiratory: CTAB Abdomen: Soft, nontender, nondistended, bowel sounds present Musculoskeletal: No bilateral pedal edema noted Skin: Normal Psychiatry: Normal mood     Data Reviewed:  As mentioned as above  Family Communication: None at bedside    Thank you very much for involving us  in the care of your patient.  Author: Veronica Gordon, MD 03/09/2024 3:32 PM  For on call review [email or web address].

## 2024-03-09 NOTE — Progress Notes (Signed)
 Urology Inpatient Progress Note  Subjective: POD #3 cystoscopy with biopsies. S/p placement of left PCN 4/25  He looks better today.  Foley has been draining blood tinged urine. Nephrostomy tube draining clear urine.  Good UOP.  No flank or abdominal pain.   BP stable. H&H 8.9/27.6 yesterday  Anti-infectives: Anti-infectives (From admission, onward)    Start     Dose/Rate Route Frequency Ordered Stop   03/07/24 1115  cefTRIAXone  (ROCEPHIN ) 2 g in sodium chloride  0.9 % 100 mL IVPB       Note to Pharmacy: Administer in IR   2 g 200 mL/hr over 30 Minutes Intravenous  Once 03/07/24 1024 03/07/24 1230   03/06/24 2200  ciprofloxacin  (CIPRO ) IVPB 400 mg        400 mg 200 mL/hr over 60 Minutes Intravenous Every 12 hours 03/06/24 2036 03/07/24 1146   03/06/24 1125  ceFAZolin  (ANCEF ) IVPB 2g/100 mL premix        2 g 200 mL/hr over 30 Minutes Intravenous 30 min pre-op 03/06/24 1125 03/06/24 1227       Current Facility-Administered Medications  Medication Dose Route Frequency Provider Last Rate Last Admin   Chlorhexidine  Gluconate Cloth 2 % PADS 6 each  6 each Topical Daily Florencio Hunting, MD   6 each at 03/08/24 1401   cholecalciferol (VITAMIN D3) 25 MCG (1000 UNIT) tablet 1,000 Units  1,000 Units Oral Daily Florencio Hunting, MD   1,000 Units at 03/08/24 0900   diphenhydrAMINE  (BENADRYL ) injection 12.5 mg  12.5 mg Intravenous Q6H PRN Florencio Hunting, MD   12.5 mg at 03/09/24 0044   Or   diphenhydrAMINE  (BENADRYL ) 12.5 MG/5ML elixir 12.5 mg  12.5 mg Oral Q6H PRN Florencio Hunting, MD       docusate sodium  (COLACE) capsule 100 mg  100 mg Oral BID Florencio Hunting, MD   100 mg at 03/08/24 0901   HYDROcodone -acetaminophen  (NORCO/VICODIN) 5-325 MG per tablet 1-2 tablet  1-2 tablet Oral Q4H PRN Florencio Hunting, MD   1 tablet at 03/08/24 2104   hyoscyamine  (LEVSIN SL) SL tablet 0.125 mg  0.125 mg Sublingual Q6H PRN Florencio Hunting, MD       insulin  aspart (novoLOG ) injection 0-9 Units  0-9 Units  Subcutaneous Q4H Payne, John D, PA-C   1 Units at 03/09/24 0825   metoprolol  tartrate (LOPRESSOR ) tablet 25 mg  25 mg Oral BID Payne, John D, PA-C   25 mg at 03/08/24 2105   nitroGLYCERIN  (NITROSTAT ) SL tablet 0.4 mg  0.4 mg Sublingual Q5 Min x 3 PRN Florencio Hunting, MD       ondansetron  (ZOFRAN ) injection 4 mg  4 mg Intravenous Q4H PRN Florencio Hunting, MD       Oral care mouth rinse  15 mL Mouth Rinse PRN Florencio Hunting, MD       rosuvastatin  (CRESTOR ) tablet 40 mg  40 mg Oral Daily Florencio Hunting, MD   40 mg at 03/08/24 0900   sodium chloride  flush (NS) 0.9 % injection 3-10 mL  3-10 mL Intravenous Q12H Borden, Lester, MD   10 mL at 03/08/24 2104   tamsulosin  (FLOMAX ) capsule 0.4 mg  0.4 mg Oral Daily Florencio Hunting, MD   0.4 mg at 03/08/24 0900   traZODone  (DESYREL ) tablet 25-50 mg  25-50 mg Oral QHS PRN Florencio Hunting, MD         Objective: Vital signs in last 24 hours: Temp:  [97.4 F (36.3 C)-98.9 F (37.2 C)] 97.6 F (36.4 C) (04/27 0800) Pulse Rate:  [  60-76] 68 (04/27 0800) Resp:  [12-26] 20 (04/27 0800) BP: (101-145)/(39-88) 132/88 (04/27 0800) SpO2:  [90 %-98 %] 96 % (04/27 0800)  Intake/Output from previous day: 04/26 0701 - 04/27 0700 In: 1127.7 [P.O.:150; I.V.:340.2; Blood:357.5] Out: 3100 [Urine:3100] Intake/Output this shift: Total I/O In: 100 [Other:100] Out: 300 [Urine:300]  GENERAL APPEARANCE: Lying in bed, NAD HEENT:  Atraumatic, normocephalic, oropharynx clear ABDOMEN:  Soft, non-tender EXTREMITIES:  Moves all extremities well, without clubbing, cyanosis, or edema NEUROLOGIC:  Alert and oriented x 3, CN II-XII grossly intact MENTAL STATUS:  appropriate BACK:  Non-tender to palpation, No CVAT, left PCN draining yellow urine SKIN:  Warm, dry, and intact GU:  foley with blood tinged urine in tubing, no clots  Lab Results:  Recent Labs    03/08/24 0737 03/08/24 1616  WBC 20.6*  --   HGB 8.0* 8.9*  HCT 25.4* 27.6*  PLT 152  --    BMET Recent Labs     03/07/24 1526 03/08/24 0243  NA 135 136  K 4.9 5.0  CL 105 107  CO2 18* 21*  GLUCOSE 169* 151*  BUN 50* 57*  CREATININE 2.62* 2.85*  CALCIUM  7.9* 7.8*   PT/INR Recent Labs    03/07/24 0806  LABPROT 19.7*  INR 1.6*   ABG No results for input(s): "PHART", "HCO3" in the last 72 hours.  Invalid input(s): "PCO2", "PO2"  Studies/Results: IR NEPHROSTOMY PLACEMENT LEFT Result Date: 03/07/2024 INDICATION: 78 year old male with complex medical history. In short, he has had prior right nephro ureterectomy and now has severe hemorrhagic cystitis of unclear etiology. Due to persistent blood loss he presents for urinary diversion on the left. EXAM: IR NEPHROSTOMY PLACEMENT LEFT COMPARISON:  None Available. MEDICATIONS: 2 g Rocephin ; The antibiotic was administered in an appropriate time frame prior to skin puncture. ANESTHESIA/SEDATION: 1 mg Versed  administered intravenously for anxiolysis. CONTRAST:  20mL OMNIPAQUE  IOHEXOL  300 MG/ML SOLN - administered into the collecting system(s) FLUOROSCOPY: Radiation exposure index: 15 mGy COMPLICATIONS: None immediate. TECHNIQUE: The procedure, risks, benefits, and alternatives were explained to the patient. Questions regarding the procedure were encouraged and answered. The patient understands and consents to the procedure. The left flank was prepped with chlorhexidine  in a sterile fashion, and a sterile drape was applied covering the operative field. A sterile gown and sterile gloves were used for the procedure. Local anesthesia was provided with 1% Lidocaine . The left flank was interrogated with ultrasound and the left kidney identified. The kidney is hydronephrotic. A suitable access site on the skin overlying the lower pole, posterior calix was identified. After local mg anesthesia was achieved, a small skin nick was made with an 11 blade scalpel. A 21 gauge Accustick needle was then advanced under direct sonographic guidance into the lower pole of the left  kidney. A 0.018 inch wire was advanced under fluoroscopic guidance into the left renal collecting system. The Accustick sheath was then advanced over the wire and a 0.018 system exchanged for a 0.035 system. Gentle hand injection of contrast material confirms placement of the sheath within the renal collecting system. There is mild hydronephrosis and a tortuous ureter. The tract from the scan into the renal collecting system was then dilated serially to 10-French. A 10-French Cook all-purpose drain was then placed and positioned under fluoroscopic guidance. The locking loop is well formed within the left renal pelvis. The catheter was secured to the skin with 2-0 Prolene and a sterile bandage was placed. Catheter was left to gravity bag drainage. IMPRESSION: Successful  placement of a left 10 French percutaneous nephrostomy tube. Electronically Signed   By: Fernando Hoyer M.D.   On: 03/07/2024 14:59     Assessment & Plan: POD #3 s/p cystoscopy, urethral dilation, bladder biopsies and fulguration.   1) Hemorrhagic cystitis -bleeding has almost completely resolved. Continue Foley catheter.  Irrigate bladder as needed.   2) CKD with AKI -creatinine increased slightly yesterday.  Labs pending today.   3) CAD/atrial fibrillation -currently stable.  Rate has been controlled.   4) Bladder cancer -pathology showed chronic inflammation without evidence of cancer.   5) Anemia - H&H stable after 1 U transfusion yesterday.  Labs pending this AM.   OK to transfer from ICU from urologic standpoint if ICU team agrees. Would like to transfer to Medicine given multiple medical issues. Continue foley and left PCN   Oda Bence, MD 03/09/2024

## 2024-03-10 ENCOUNTER — Inpatient Hospital Stay (HOSPITAL_BASED_OUTPATIENT_CLINIC_OR_DEPARTMENT_OTHER): Admitting: Hematology

## 2024-03-10 ENCOUNTER — Inpatient Hospital Stay (HOSPITAL_COMMUNITY)

## 2024-03-10 DIAGNOSIS — D62 Acute posthemorrhagic anemia: Secondary | ICD-10-CM | POA: Diagnosis not present

## 2024-03-10 DIAGNOSIS — C679 Malignant neoplasm of bladder, unspecified: Secondary | ICD-10-CM | POA: Diagnosis not present

## 2024-03-10 DIAGNOSIS — C689 Malignant neoplasm of urinary organ, unspecified: Secondary | ICD-10-CM

## 2024-03-10 DIAGNOSIS — R000 Tachycardia, unspecified: Secondary | ICD-10-CM | POA: Diagnosis not present

## 2024-03-10 DIAGNOSIS — N179 Acute kidney failure, unspecified: Secondary | ICD-10-CM | POA: Diagnosis not present

## 2024-03-10 LAB — CBC WITH DIFFERENTIAL/PLATELET
Abs Immature Granulocytes: 0.09 10*3/uL — ABNORMAL HIGH (ref 0.00–0.07)
Basophils Absolute: 0.1 10*3/uL (ref 0.0–0.1)
Basophils Relative: 1 %
Eosinophils Absolute: 0.3 10*3/uL (ref 0.0–0.5)
Eosinophils Relative: 2 %
HCT: 26.6 % — ABNORMAL LOW (ref 39.0–52.0)
Hemoglobin: 8.3 g/dL — ABNORMAL LOW (ref 13.0–17.0)
Immature Granulocytes: 1 %
Lymphocytes Relative: 14 %
Lymphs Abs: 1.6 10*3/uL (ref 0.7–4.0)
MCH: 27.7 pg (ref 26.0–34.0)
MCHC: 31.2 g/dL (ref 30.0–36.0)
MCV: 88.7 fL (ref 80.0–100.0)
Monocytes Absolute: 1.5 10*3/uL — ABNORMAL HIGH (ref 0.1–1.0)
Monocytes Relative: 13 %
Neutro Abs: 8.3 10*3/uL — ABNORMAL HIGH (ref 1.7–7.7)
Neutrophils Relative %: 69 %
Platelets: 147 10*3/uL — ABNORMAL LOW (ref 150–400)
RBC: 3 MIL/uL — ABNORMAL LOW (ref 4.22–5.81)
RDW: 16.9 % — ABNORMAL HIGH (ref 11.5–15.5)
WBC: 11.8 10*3/uL — ABNORMAL HIGH (ref 4.0–10.5)
nRBC: 0 % (ref 0.0–0.2)

## 2024-03-10 LAB — BPAM RBC
Blood Product Expiration Date: 202505252359
Blood Product Expiration Date: 202505272359
Blood Product Expiration Date: 202505272359
ISSUE DATE / TIME: 202504250852
ISSUE DATE / TIME: 202504251023
ISSUE DATE / TIME: 202504261113
Unit Type and Rh: 6200
Unit Type and Rh: 6200
Unit Type and Rh: 6200

## 2024-03-10 LAB — GLUCOSE, CAPILLARY
Glucose-Capillary: 125 mg/dL — ABNORMAL HIGH (ref 70–99)
Glucose-Capillary: 199 mg/dL — ABNORMAL HIGH (ref 70–99)
Glucose-Capillary: 142 mg/dL — ABNORMAL HIGH (ref 70–99)
Glucose-Capillary: 212 mg/dL — ABNORMAL HIGH (ref 70–99)

## 2024-03-10 LAB — TYPE AND SCREEN
ABO/RH(D): A POS
Antibody Screen: NEGATIVE
Unit division: 0
Unit division: 0
Unit division: 0

## 2024-03-10 LAB — VITAMIN B12: Vitamin B-12: 808 pg/mL (ref 180–914)

## 2024-03-10 LAB — IRON AND TIBC
Iron: 21 ug/dL — ABNORMAL LOW (ref 45–182)
Saturation Ratios: 8 % — ABNORMAL LOW (ref 17.9–39.5)
TIBC: 263 ug/dL (ref 250–450)
UIBC: 242 ug/dL

## 2024-03-10 LAB — FOLATE: Folate: 8.2 ng/mL (ref >5.9)

## 2024-03-10 LAB — MAGNESIUM: Magnesium: 2 mg/dL (ref 1.7–2.4)

## 2024-03-10 LAB — BASIC METABOLIC PANEL WITH GFR
Anion gap: 6 (ref 5–15)
BUN: 43 mg/dL — ABNORMAL HIGH (ref 8–23)
CO2: 22 mmol/L (ref 22–32)
Calcium: 8 mg/dL — ABNORMAL LOW (ref 8.9–10.3)
Chloride: 109 mmol/L (ref 98–111)
Creatinine, Ser: 2.14 mg/dL — ABNORMAL HIGH (ref 0.61–1.24)
GFR, Estimated: 31 mL/min — ABNORMAL LOW (ref >60)
Glucose, Bld: 147 mg/dL — ABNORMAL HIGH (ref 70–99)
Potassium: 4.2 mmol/L (ref 3.5–5.1)
Sodium: 137 mmol/L (ref 135–145)

## 2024-03-10 LAB — FERRITIN: Ferritin: 58 ng/mL (ref 24–336)

## 2024-03-10 MED ORDER — LIDOCAINE HCL 1 % IJ SOLN
INTRAMUSCULAR | Status: AC
  Filled 2024-03-10: qty 20

## 2024-03-10 MED ORDER — LIDOCAINE HCL 1 % IJ SOLN
10.0000 mL | Freq: Once | INTRAMUSCULAR | Status: AC
  Administered 2024-03-10: 5 mL via INTRADERMAL

## 2024-03-10 MED ORDER — IOHEXOL 300 MG/ML  SOLN
50.0000 mL | Freq: Once | INTRAMUSCULAR | Status: AC | PRN
  Administered 2024-03-10: 15 mL

## 2024-03-10 MED ORDER — BISACODYL 10 MG RE SUPP
10.0000 mg | Freq: Once | RECTAL | Status: AC
  Administered 2024-03-10: 10 mg via RECTAL
  Filled 2024-03-10: qty 1

## 2024-03-10 NOTE — Plan of Care (Signed)
  Problem: Education: Goal: Knowledge of General Education information will improve Description: Including pain rating scale, medication(s)/side effects and non-pharmacologic comfort measures Outcome: Progressing   Problem: Health Behavior/Discharge Planning: Goal: Ability to manage health-related needs will improve Outcome: Progressing   Problem: Clinical Measurements: Goal: Ability to maintain clinical measurements within normal limits will improve Outcome: Progressing Goal: Will remain free from infection Outcome: Progressing Goal: Diagnostic test results will improve Outcome: Progressing Goal: Respiratory complications will improve Outcome: Progressing Goal: Cardiovascular complication will be avoided Outcome: Progressing   Problem: Activity: Goal: Risk for activity intolerance will decrease Outcome: Progressing   Problem: Nutrition: Goal: Adequate nutrition will be maintained Outcome: Progressing   Problem: Coping: Goal: Level of anxiety will decrease Outcome: Progressing   Problem: Elimination: Goal: Will not experience complications related to bowel motility Outcome: Progressing Goal: Will not experience complications related to urinary retention Outcome: Progressing   Problem: Pain Managment: Goal: General experience of comfort will improve and/or be controlled Outcome: Progressing   Problem: Safety: Goal: Ability to remain free from injury will improve Outcome: Progressing   Problem: Skin Integrity: Goal: Risk for impaired skin integrity will decrease Outcome: Progressing   Problem: Education: Goal: Knowledge of the prescribed therapeutic regimen will improve Outcome: Progressing   Problem: Bowel/Gastric: Goal: Gastrointestinal status for postoperative course will improve Outcome: Progressing   Problem: Health Behavior/Discharge Planning: Goal: Identification of resources available to assist in meeting health care needs will improve Outcome:  Progressing   Problem: Skin Integrity: Goal: Demonstration of wound healing without infection will improve Outcome: Progressing   Problem: Urinary Elimination: Goal: Ability to avoid or minimize complications of infection will improve Outcome: Progressing   Problem: Education: Goal: Ability to describe self-care measures that may prevent or decrease complications (Diabetes Survival Skills Education) will improve Outcome: Progressing Goal: Individualized Educational Video(s) Outcome: Progressing   Problem: Coping: Goal: Ability to adjust to condition or change in health will improve Outcome: Progressing   Problem: Fluid Volume: Goal: Ability to maintain a balanced intake and output will improve Outcome: Progressing   Problem: Health Behavior/Discharge Planning: Goal: Ability to identify and utilize available resources and services will improve Outcome: Progressing Goal: Ability to manage health-related needs will improve Outcome: Progressing   Problem: Metabolic: Goal: Ability to maintain appropriate glucose levels will improve Outcome: Progressing   Problem: Nutritional: Goal: Maintenance of adequate nutrition will improve Outcome: Progressing Goal: Progress toward achieving an optimal weight will improve Outcome: Progressing   Problem: Skin Integrity: Goal: Risk for impaired skin integrity will decrease Outcome: Progressing   Problem: Tissue Perfusion: Goal: Adequacy of tissue perfusion will improve Outcome: Progressing

## 2024-03-10 NOTE — Progress Notes (Signed)
 Patient ID: Adam Harris, male   DOB: 1946/09/25, 78 y.o.   MRN: [id number]  4 Days Post-Op Subjective: Pt transferred out of the ICU yesterday.  Hgb stabilizing and urine clearing.  Renal function improving.  Objective: Vital signs in last 24 hours: Temp:  [97.5 F (36.4 C)-98.7 F (37.1 C)] 98.3 F (36.8 C) (04/28 0540) Pulse Rate:  [63-83] 72 (04/28 0540) Resp:  [3-23] 16 (04/28 0540) BP: (117-144)/(64-87) 122/77 (04/28 0540) SpO2:  [91 %-98 %] 96 % (04/28 0540) Weight:  [102.5 kg] 102.5 kg (04/27 1700)  Intake/Output from previous day: 04/27 0701 - 04/28 0700 In: 650 [P.O.:240; I.V.:10] Out: 3725 [Urine:3725] Intake/Output this shift: No intake/output data recorded.  Physical Exam:  General: Alert and oriented Abdomen: Soft, ND, PCN draining clear urine GU: Urine clear in catheter tubing Ext: NT, No erythema  Lab Results: Recent Labs    03/08/24 1616 03/09/24 1113 03/10/24 0535  HGB 8.9* 8.5* 8.3*  HCT 27.6* 26.8* 26.6*   BMET Recent Labs    03/09/24 1113 03/10/24 0535  NA 139 137  K 4.3 4.2  CL 109 109  CO2 23 22  GLUCOSE 141* 147*  BUN 43* 43*  CREATININE 2.32* 2.14*  CALCIUM  8.0* 8.0*     Studies/Results: No results found.  Assessment/Plan: POD # 14 s/p cystoscopy, urethral dilation, bladder biopsies and fulguration   1) Hemorrhagic cystitis: Acute bleeding now stopped.  Hgb stable.  Will d/c urethral catheter and monitor for next 24 hrs.  Will leave PCN in place to divert urine.   2) CKD with AKI: Cr improving now to 2.1. Will order a nephrostogram to assess for evidence of obstruction although he is making some urine from bladder indicating that he must not be completely obstructed with his known left solitary kidney.   3) CAD/atrial fibrillation:  Rate controlled now.  Appreciate help from medicine.   4) Bladder cancer:  Biopsies were negative for malignancy which still raises doubt about true recurrence.  Will discuss with Dr. Maryalice Harris about  whether to continue Keytruda  or hold therapy for now.  He may benefit from a cystectomy ultimately even just for palliative reasons but will need to have a multidisciplinary discussion with him and Dr. Maryalice Harris about options.   LOS: 3 days   Adam Harris 03/10/2024, 8:05 AM

## 2024-03-10 NOTE — Progress Notes (Signed)
 Adam Harris   DOB:September 03, 1946   WJ#:[id number]   AOZ#:[id number]  Medical oncology follow-up note patient  Subjective: Patient is well-known to me, he is on immunotherapy for his recurrent superficial bladder cancer.  He had cystoscopy last week, which was complicated with significant bleeding, which required nephrostomy tube and hospital admission.  He had a nephrostomy tube exchanged by IR today.  He complains of new onset right foot pain and also reports difficulty urination after Foley was removed.  He was uncomfortable, sitting in the bed when I saw him.   Objective:  Vitals:   03/10/24 0942 03/10/24 2122  BP: 122/77 120/70  Pulse: 74 85  Resp:  20  Temp:  98.6 F (37 C)  SpO2: 91% 90%    Body mass index is 31.51 kg/m.  Intake/Output Summary (Last 24 hours) at 03/10/2024 2147 Last data filed at 03/10/2024 2041 Gross per 24 hour  Intake 580 ml  Output 1625 ml  Net -1045 ml     Sclerae unicteric  Oropharynx clear  No peripheral adenopathy  Lungs clear -- no rales or rhonchi  Heart regular rate and rhythm  Abdomen benign  MSK no focal spinal tenderness, no peripheral edema, no color change of his feet  Neuro nonfocal    CBG (last 3)  Recent Labs    03/10/24 1140 03/10/24 1623 03/10/24 2124  GLUCAP 199* 142* 212*     Labs:  Lab Results  Component Value Date   WBC 11.8 (H) 03/10/2024   HGB 8.3 (L) 03/10/2024   HCT 26.6 (L) 03/10/2024   MCV 88.7 03/10/2024   PLT 147 (L) 03/10/2024   NEUTROABS 8.3 (H) 03/10/2024     Urine Studies No results for input(s): "UHGB", "CRYS" in the last 72 hours.  Invalid input(s): "UACOL", "UAPR", "USPG", "UPH", "UTP", "UGL", "UKET", "UBIL", "UNIT", "UROB", "ULEU", "UEPI", "UWBC", "URBC", "UBAC", "CAST", "UCOM", "BILUA"  Basic Metabolic Panel: Recent Labs  Lab 03/07/24 0306 03/07/24 1526 03/07/24 1754 03/08/24 0243 03/09/24 1113 03/10/24 0534 03/10/24 0535  NA 134* 135  --  136 139  --  137  K 5.2* 4.9  --  5.0 4.3  --  4.2   CL 107 105  --  107 109  --  109  CO2 21* 18*  --  21* 23  --  22  GLUCOSE 221* 169*  --  151* 141*  --  147*  BUN 37* 50*  --  57* 43*  --  43*  CREATININE 2.32* 2.62*  --  2.85* 2.32*  --  2.14*  CALCIUM  7.6* 7.9*  --  7.8* 8.0*  --  8.0*  MG  --   --  1.9  --   --  2.0  --    GFR Estimated Creatinine Clearance: 35.2 mL/min (A) (by C-G formula based on SCr of 2.14 mg/dL (H)). Liver Function Tests: No results for input(s): "AST", "ALT", "ALKPHOS", "BILITOT", "PROT", "ALBUMIN " in the last 168 hours. No results for input(s): "LIPASE", "AMYLASE" in the last 168 hours. No results for input(s): "AMMONIA" in the last 168 hours. Coagulation profile Recent Labs  Lab 03/07/24 0806  INR 1.6*    CBC: Recent Labs  Lab 03/08/24 0243 03/08/24 0737 03/08/24 1616 03/09/24 1113 03/10/24 0535  WBC  --  20.6*  --  13.6* 11.8*  NEUTROABS  --   --   --   --  8.3*  HGB 7.7* 8.0* 8.9* 8.5* 8.3*  HCT 23.9* 25.4* 27.6* 26.8* 26.6*  MCV  --  87.3  --  87.3 88.7  PLT  --  152  --  126* 147*   Cardiac Enzymes: No results for input(s): "CKTOTAL", "CKMB", "CKMBINDEX", "TROPONINI" in the last 168 hours. BNP: Invalid input(s): "POCBNP" CBG: Recent Labs  Lab 03/09/24 2149 03/10/24 0722 03/10/24 1140 03/10/24 1623 03/10/24 2124  GLUCAP 213* 125* 199* 142* 212*   D-Dimer No results for input(s): "DDIMER" in the last 72 hours. Hgb A1c No results for input(s): "HGBA1C" in the last 72 hours. Lipid Profile No results for input(s): "CHOL", "HDL", "LDLCALC", "TRIG", "CHOLHDL", "LDLDIRECT" in the last 72 hours. Thyroid  function studies No results for input(s): "TSH", "T4TOTAL", "T3FREE", "THYROIDAB" in the last 72 hours.  Invalid input(s): "FREET3" Anemia work up Recent Labs    03/10/24 0535  VITAMINB12 808  FOLATE 8.2  FERRITIN 58  TIBC 263  IRON 21*   Microbiology Recent Results (from the past 240 hours)  MRSA Next Gen by PCR, Nasal     Status: Abnormal   Collection Time: 03/07/24   2:04 AM   Specimen: Nasal Mucosa; Nasal Swab  Result Value Ref Range Status   MRSA by PCR Next Gen DETECTED (A) NOT DETECTED Final    Comment: (NOTE) The GeneXpert MRSA Assay (FDA approved for NASAL specimens only), is one component of a comprehensive MRSA colonization surveillance program. It is not intended to diagnose MRSA infection nor to guide or monitor treatment for MRSA infections. Test performance is not FDA approved in patients less than 47 years old. Performed at [facility], [address], [place], [place] [address]       Studies:  IR NEPHROSTOMY EXCHANGE LEFT Result Date: 03/10/2024 INDICATION: 78 year old with hemorrhagic cystitis. Left percutaneous nephrostomy tube was recently placed on 03/07/2024. Urology requested a nephrostogram to evaluate the renal collecting system and look for any obstruction. 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 EXAM: 1. LEFT NEPHROSTOGRAM THROUGH EXISTING ACCESS 2. EXCHANGE OF LEFT NEPHROSTOMY TUBE WITH FLUOROSCOPY Physician: Olive Better. Julietta Ogren, MD COMPARISON:  None Available. MEDICATIONS: 1% lidocaine  ANESTHESIA/SEDATION: None CONTRAST:  15mL OMNIPAQUE  IOHEXOL  300 MG/ML SOLN - administered into the collecting system(s) FLUOROSCOPY: Radiation Exposure Index (as provided by the fluoroscopic device): 76 mGy Kerma COMPLICATIONS: None immediate. PROCEDURE: Patient was placed prone on the interventional table. Scout image was obtained. Left nephrostogram was performed through the nephrostomy tube. The nephrostomy tube was partially dislodged. As a result, we proceeded with a left nephrostomy tube exchange. The procedure was explained to the patient. The risks and benefits of the procedure were discussed and the patient's questions were addressed. Informed consent was obtained from the patient. The existing catheter and surrounding skin were prepped and draped in sterile fashion. Maximal barrier  sterile technique was utilized including caps, mask, sterile gowns, sterile gloves, sterile drape, hand hygiene and skin antiseptic. Contrast was injected through the nephrostomy tube. Nephrostomy tube was cut and removed over a wire. New 10 French multipurpose drain was advanced over the wire and reconstituted in the renal pelvis. Contrast injection confirmed placement in the renal pelvis. Skin was anesthetized with 1% lidocaine . Nephrostomy tube was sutured to skin. Nephrostomy tube was flushed with saline and capped. Fluoroscopic images were taken and saved for this procedure. FINDINGS: Left nephrostomy tube was positioned in a lower pole calyx and the tube was partially dislodged with some contrast leaking along the drain tract. Mild dilatation the left renal collecting system and left ureter. Left ureter was tortuous. Small segment of the  distal ureter was difficult to opacify but no suspicious filling defects in the left renal collecting system. Contrast was spontaneously draining into the urinary bladder. Left nephrostomy tube was exchanged and the new nephrostomy tube was positioned in the left renal pelvis. IMPRESSION: 1. Left ureter is patent with drainage into the urinary bladder. 2. Mild dilatation of the left ureter without suspicious filling defects or gross abnormalities. 3. Left nephrostomy tube exchanged because the tube was partially dislodged. Electronically Signed   By: Elene Griffes M.D.   On: 03/10/2024 15:11    Assessment: 78 y.o.  Hemorrhagic cystitis Acute on chronic renal failure, status post left nephrostomy tube replacement Coronary artery disease and atrial fibrillation, status post Watchman procedure and on Plavix  which was held due to hemorrhagic cystitis. Right foot pain  Dyspnea   Plan:  - I reviewed his hospital course, and communicated with Dr. Rozanne Corners  -not sure what caused his right foot pain, it would be reasonable to obtain a Doppler to rule out DVT.  I messaged  hospitalist Dr. Duard Getting who saw him earlier today for consult  -will hold on Keytruda  for now  -I will f/u as needed when he is in hospital.    Sonja Altus, MD 03/10/2024

## 2024-03-10 NOTE — Hospital Course (Signed)
 Adam Harris is a 78 y.o. male with a history of atrial fibrillation status post Watchman procedure, CAD, CKD stage IV, heart failure, hypertension, diabetes mellitus type 2, bladder cancer.  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  Medicine consulted for management of chronic medical issues in addition to tachyarrhythmia.

## 2024-03-10 NOTE — Progress Notes (Signed)
 Patient currently not in his room.  Cardiology was asked to consult due to tachyarrhythmias with chronic fibrillation and right bundle branch block.  Patient not in his room as he has gone down for a procedure.  Will see patient tomorrow morning 03/11/2024

## 2024-03-10 NOTE — Progress Notes (Signed)
 PROGRESS NOTE    Adam Harris  ZOX:[id number] DOB: Sep 24, 1946 DOA: 03/06/2024 PCP: Graig Lawyer, MD   Brief Narrative: Adam Harris is a 78 y.o. male with a history of atrial fibrillation status post Watchman procedure, CAD, CKD stage IV, heart failure, hypertension, diabetes mellitus type 2, bladder cancer.  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  Medicine consulted for management of chronic medical issues in addition to tachyarrhythmia.   Assessment and Plan:  Tachyarrhythmia with wide complex Paroxysmal atrial fibrillation s/p watchman Known right bundle branch block. Tachycardia presumed secondary to acute anemia.  Patient has a history of Watchman procedure and is currently off Eliquis  but is currently on Plavix  with plan to continue through to July 2025. -Continue telemetry -Continue metoprolol  BID  Acute blood loss anemia Secondary to hematuria from hemorrhagic cystitis. Patient received a total of 3 units of PRBC.  Gross hematuria Managed per urology. No improvement after repeat operative fulguration. Patient required bladder irrigation. Left nephrostomy tube placed byiR.  Leukocytosis Likely reactive.  Thrombocytopenia Mild. Likely secondary to acute blood loss. Improving.  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 Baseline creatinine of about 1.8-2. Creatinine peak of 2.85, now improving with nephrostomy tube in place.  Diabetes mellitus, type 2 Well controlled with hemoglobin A1C of 6.8%. -Continue SSI  Primary hypertension -Continue metoprolol   CAD -Continue metoprolol  and Crestor  -Plavix  held secondary to bleed; resume per primary  Chronic diastolic heart failure Noted. Euvolemic.  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  Patient is refractory to BCG.  Patient is currently being treated with IV pembrolizumab .   Patient follows with urology as an outpatient.   DVT prophylaxis: Per primary Code Status:   Code Status: Full Code Family Communication: None at bedside Disposition Plan: Per primary   Subjective: No concerns today. Feels well.  Objective: BP 122/77 (BP Location: Right Arm)   Pulse 72   Temp 98.3 F (36.8 C) (Oral)   Resp 16   Ht 5\' 11"  (1.803 m)   Wt 102.5 kg   SpO2 96%   BMI 31.51 kg/m   Examination:  General exam: Appears calm and comfortable Respiratory system: Clear to auscultation. Respiratory effort normal. Cardiovascular system: S1 & S2 heard. No murmurs, rubs, gallops or clicks. Gastrointestinal system: Abdomen is nondistended, soft and nontender. Normal bowel sounds heard. Central nervous system: Alert and oriented. No focal neurological deficits. Musculoskeletal: No edema. No calf tenderness Psychiatry: Judgement and insight appear normal. Mood & affect appropriate.    Data Reviewed: I have personally reviewed following labs and imaging studies  CBC Lab Results  Component Value Date   WBC 11.8 (H) 03/10/2024   RBC 3.00 (L) 03/10/2024   HGB 8.3 (L) 03/10/2024   HCT 26.6 (L) 03/10/2024   MCV 88.7 03/10/2024   MCH 27.7 03/10/2024   PLT 147 (L) 03/10/2024   MCHC 31.2 03/10/2024   RDW 16.9 (H) 03/10/2024   LYMPHSABS 1.6 03/10/2024   MONOABS 1.5 (H) 03/10/2024   EOSABS 0.3 03/10/2024   BASOSABS 0.1 03/10/2024     Last metabolic panel Lab Results  Component Value Date   NA 137 03/10/2024   K 4.2 03/10/2024   CL 109 03/10/2024   CO2 22 03/10/2024   BUN 43 (H) 03/10/2024   CREATININE 2.14 (H) 03/10/2024   GLUCOSE 147 (H) 03/10/2024   GFRNONAA 31 (L) 03/10/2024   GFRAA 32 (L)  08/05/2020   CALCIUM  8.0 (L) 03/10/2024   PHOS 4.2 08/06/2023   PROT 7.5 02/21/2024   ALBUMIN  3.6 02/21/2024   BILITOT 2.0 (H) 02/21/2024   ALKPHOS 125 02/21/2024   AST 12 (L) 02/21/2024   ALT 11 02/21/2024   ANIONGAP 6 03/10/2024    GFR: Estimated Creatinine  Clearance: 35.2 mL/min (A) (by C-G formula based on SCr of 2.14 mg/dL (H)).  Recent Results (from the past 240 hours)  MRSA Next Gen by PCR, Nasal     Status: Abnormal   Collection Time: 03/07/24  2:04 AM   Specimen: Nasal Mucosa; Nasal Swab  Result Value Ref Range Status   MRSA by PCR Next Gen DETECTED (A) NOT DETECTED Final    Comment: (NOTE) The GeneXpert MRSA Assay (FDA approved for NASAL specimens only), is one component of a comprehensive MRSA colonization surveillance program. It is not intended to diagnose MRSA infection nor to guide or monitor treatment for MRSA infections. Test performance is not FDA approved in patients less than 3 years old. Performed at [facility], [address], [place], [place] [address]       Radiology Studies: No results found.    LOS: 3 days    Aneita Keens, MD [facility] 03/10/2024, 7:26 AM   If 7PM-7AM, please contact night-coverage [email or web address]

## 2024-03-10 NOTE — Progress Notes (Unsigned)
 [facility]   [phone] [phone]   Clinic Follow up Note   Patient Care Team: Graig Lawyer, MD as PCP - General (Family Medicine) Eilleen Grates, MD as PCP - Cardiology (Cardiology) Ardeen Kohler, MD as PCP - Electrophysiology (Cardiology) Florencio Hunting, MD as Consulting Physician (Urology) Eilleen Grates, MD as Consulting Physician (Cardiology) Nan Aver, MD as Consulting Physician (Nephrology) Sonja Waterbury, MD as Consulting Physician (Hematology and Oncology) [facility]  Date of Service:  03/10/2024  CHIEF COMPLAINT: f/u of ***  CURRENT THERAPY:  ***  Oncology History   No problem-specific Assessment & Plan notes found for this encounter.  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 Mild degradation secondary to lack of IV contrast and motion. 2. Similar to decrease in mild right-sided bladder wall thickening, nonspecific in the setting of prior bladder cancer. Most likely treatment related. 3. Borderline left external iliac and right inguinal adenopathy, decreased. This could represent response to therapy of metastasis or be reactive. 4. No evidence of abdominal metastasis or recurrent disease, status post right nephrectomy.   03/22/2023 Imaging   MR abdomen without contrast  IMPRESSION: 1. Mild degradation secondary to lack of IV  contrast and motion. 2. Similar to decrease in mild right-sided bladder wall thickening, nonspecific in the setting of prior bladder cancer. Most likely treatment related. 3. Borderline left external iliac and right inguinal adenopathy, decreased. This could represent response to therapy of metastasis or be reactive. 4. No evidence of abdominal metastasis or recurrent disease, status post right nephrectomy.     06/08/2023 Cancer Staging   Staging form: Urinary Bladder, AJCC 8th Edition - Clinical stage from 06/08/2023: Stage 0is (cTis, cN0, cM0) - Signed by Sonja Holland, MD on 06/09/2023 WHO/ISUP grade (low/high): High Grade Histologic grading system: 2 grade system   07/31/2023 Imaging   CT chest without contrast   IMPRESSION: No findings suspicious for pneumonitis.  No evidence of metastatic disease.  Aortic Atherosclerosis (ICD10-I70.0).   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  History of Present Illness      All other systems were reviewed with the patient and are negative.  MEDICAL HISTORY:  Past Medical History:  Diagnosis Date   Abnormal radiologic findings on diagnostic imaging of renal pelvis, ureter, or bladder    bilateral ureter abnormalities   Anticoagulant long-term use    eliquis    Anxiety    pt denies   Arthritis    Atrial fibrillation, chronic (HCC)    CAD (coronary artery disease) cardiologist-- dr Lavonne Pra[place]  NSTEMI 02-04-2014  per cardiac cath chronic occluded RCA w/ faint left-to-right collaterals and aneurysmal LCFx with sluggish coronary flow/   NSTEMI --11-21-2016 per cardiac cath occluded proximal RCA & mid to diastal CFX 100%, med rx.  If that does not work, PTCA or CABG   Cancer [facility])    bladder and kidney   CHF (congestive heart failure) (HCC)    CKD (chronic kidney disease), stage III (HCC)    patient unaware   DOE (dyspnea on exertion)    Fatty liver    pt denies   Hematuria 02/2019   History of COVID-19 10/2019   History of non-ST elevation myocardial infarction (NSTEMI)    02-04-2014  and 11-21-2016  cardiac cath done both times ,  medically management   History of shingles 12/2017   slight pain and numbness still noted in the area   Hyperlipidemia    Hypertension    Insomnia    Myocardial infarction [facility]) 2015   Persistent atrial fibrillation [facility])    cardiologsit-- dr hochrein   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 Daneil Dunker   Thoracic aortic atherosclerosis (HCC)    Type 2 diabetes mellitus (HCC)    Urinary frequency     SURGICAL HISTORY: Past Surgical History:  Procedure Laterality Date   CARDIAC CATHETERIZATION N/A 11/21/2016   Procedure: Left Heart Cath and Coronary Angiography;  Surgeon: Millicent Ally, MD;  Location: [facility] INVASIVE CV LAB;  Service: Cardiovascular;  Laterality: N/A;  pRCA 100% , ostial LAD 45%, OM3 80%, mCFX to dCFX 100% (AV groove), lateral OM3 50%   COLONOSCOPY     CYSTOSCOPY WITH BIOPSY N/A 08/12/2020   Procedure: CYSTOSCOPY WITH BLADDER BIOPSY AND TRANSURETHRAL RESECTION OF BLADDER TUMOR;  Surgeon: Florencio Hunting, MD;  Location: [facility] ORS;  Service: Urology;  Laterality: N/A;   CYSTOSCOPY WITH BIOPSY N/A 11/10/2021   Procedure: CYSTOSCOPY WITH BLADDER BIOPSIES/ LEFT RETROGRADE;  Surgeon: Florencio Hunting, MD;  Location: [facility];  Service: Urology;  Laterality: N/A;   CYSTOSCOPY WITH BIOPSY Left 07/27/2022   Procedure: CYSTOSCOPY WITH  BLADDER BIOPSY, TRANSURETHRAL FULGERATION OF BLADDER, EXAM UNDER ANESTHESIA, LEFT RETROGRADE PYELOGRAM;  Surgeon: Florencio Hunting, MD;  Location: [facility];  Service: Urology;  Laterality:  Left;   CYSTOSCOPY WITH BIOPSY N/A 04/23/2023   Procedure: CYSTOSCOPY WITH BLADDER AND PROSTATIC URETHRAL BIOPSIES;  Surgeon: Florencio Hunting, MD;  Location: [facility];  Service: Urology;  Laterality: N/A;  60 MINUTES NEEDED FOR CASE   CYSTOSCOPY WITH BIOPSY N/A 03/06/2024   Procedure: CYSTOSCOPY, WITH BIOPSY;  Surgeon: Florencio Hunting, MD;  Location: [facility];  Service: Urology;  Laterality: N/A;  CYSTOSCOPY WITH BLADDER BIOPSIES   CYSTOSCOPY WITH FULGERATION N/A 03/06/2024   Procedure: CYSTOSCOPY, WITH BLADDER FULGURATION, TURBT;  Surgeon: Florencio Hunting, MD;  Location: [facility];  Service: Urology;  Laterality: N/A;   CYSTOSCOPY WITH URETEROSCOPY AND STENT PLACEMENT Right 12/15/2019   Procedure: CYSTOSCOPY WITH RIGHT RETROGRADE/ RIGHT URETEROSCOPY/ BIOPSY;  Surgeon: Ottelin, Mark, MD;  Location: [facility] ;  Service: Urology;  Laterality: Right;   CYSTOSCOPY/RETROGRADE/URETEROSCOPY Bilateral 03/18/2018   Procedure: CYSTOSCOPY/RETROGRADE/URETEROSCOPY.;  Surgeon: Ottelin, Mark, MD;  Location: [facility];  Service: Urology;  Laterality: Bilateral;   EYE SURGERY Bilateral    cateract in January 2023   HYDROCELE EXCISION Left 07/27/2022   Procedure: HYDROCELE REPAIR;  Surgeon: Florencio Hunting, MD;  Location: [facility];  Service: Urology;  Laterality: Left;  GENERAL ANESTHESIA WITH  PARALYSIS   IR NEPHROSTOMY EXCHANGE LEFT  03/10/2024   IR NEPHROSTOMY PLACEMENT LEFT  03/07/2024   KNEE ARTHROSCOPY Right    LEFT ATRIAL APPENDAGE OCCLUSION N/A 12/13/2023   Procedure: LEFT ATRIAL APPENDAGE OCCLUSION;  Surgeon: Ardeen Kohler, MD;  [facility] INVASIVE CV LAB;  Service: Cardiovascular;  Laterality: N/A;   LEFT HEART CATHETERIZATION WITH CORONARY ANGIOGRAM N/A 02/04/2014   Procedure: LEFT HEART CATHETERIZATION WITH CORONARY ANGIOGRAM;  Surgeon: Wenona Hamilton, MD;  Location: [facility] CATH LAB;  Service: Cardiovascular;  Laterality: N/A;  severe one-vessel CAD, chronically occluded RCA with faint  left-to-right collaterals;  aneurysmal LCFx with sluggish coronary flow;  normal LVSF w/ moderately elevated LVEDP (ostialOM2 20%, pOM3 20%, pD1 20%, mCFX 50%, diffuse 20% pCFX)   PROSTATE BIOPSY N/A 08/12/2020   Procedure: BIOPSY TRANSRECTAL ULTRASONIC PROSTATE (TUBP);  Surgeon: Florencio Hunting, MD;  Location: [facility];  Service: Urology;  Laterality: N/A;   ROBOT ASSITED LAPAROSCOPIC NEPHROURETERECTOMY Right 03/22/2020   Procedure: XI ROBOT ASSITED LAPAROSCOPIC NEPHROURETERECTOMY;  Surgeon: Florencio Hunting, MD;  Location: [facility];  Service: Urology;  Laterality: Right;   TRANSESOPHAGEAL ECHOCARDIOGRAM (CATH LAB) N/A 12/13/2023   Procedure: TRANSESOPHAGEAL ECHOCARDIOGRAM;  Surgeon: Ardeen Kohler, MD;  Location: [facility] INVASIVE CV LAB;  Service: Cardiovascular;  Laterality: N/A;   TRANSESOPHAGEAL ECHOCARDIOGRAM (CATH LAB) N/A 02/11/2024   Procedure: TRANSESOPHAGEAL ECHOCARDIOGRAM;  Surgeon: Harrold Lincoln, MD;  Location: [facility] INVASIVE CV LAB;  Service: Cardiovascular;  Laterality: N/A;   TRANSTHORACIC ECHOCARDIOGRAM  11-22-2016   dr hochrein   ef 55-60%/ mild MR and TR/ moderate LAE   TRANSURETHRAL RESECTION OF BLADDER TUMOR N/A 02/10/2021   Procedure: TRANSURETHRAL RESECTION OF BLADDER TUMOR (TURBT)/ CYSTOSCOPY/ LEFT RETROGRADE;  Surgeon: Florencio Hunting, MD;  Location: [facility];  Service: Urology;  Laterality: N/A;  GENERAL ANESTHESIA WITH PARALYSIS    I have reviewed the social history and family history with the patient and they are unchanged from previous note.  ALLERGIES:  is allergic to xarelto  [rivaroxaban ].  MEDICATIONS:  No current facility-administered medications for this visit.   No current outpatient medications on file.   Facility-Administered Medications Ordered in Other Visits  Medication Dose Route Frequency Provider Last Rate Last Admin   Chlorhexidine  Gluconate Cloth 2 % PADS 6 each  6 each Topical Daily Florencio Hunting, MD   6 each at 03/10/24 0944   cholecalciferol (VITAMIN  D3) 25 MCG (1000 UNIT) tablet 1,000 Units  1,000 Units Oral Daily Florencio Hunting, MD   1,000 Units at 03/10/24 0945   diphenhydrAMINE  (BENADRYL ) injection 12.5 mg  12.5 mg Intravenous Q6H PRN Florencio Hunting, MD   12.5 mg at 03/09/24 2221   Or   diphenhydrAMINE  (BENADRYL ) 12.5 MG/5ML elixir 12.5 mg  12.5 mg Oral Q6H PRN Florencio Hunting, MD   12.5 mg at 03/10/24 2050   docusate sodium  (COLACE) capsule 100 mg  100 mg Oral BID Florencio Hunting, MD   100 mg at 03/10/24 2050   HYDROcodone -acetaminophen  (NORCO/VICODIN) 5-325 MG per tablet 1-2 tablet  1-2 tablet Oral Q4H PRN Florencio Hunting, MD   2 tablet at 03/10/24 2051   hyoscyamine  (LEVSIN SL) SL tablet 0.125 mg  0.125 mg Sublingual Q6H PRN Florencio Hunting, MD       insulin  aspart (novoLOG ) injection 0-5 Units  0-5 Units Subcutaneous QHS Ezenduka, Nkeiruka J, MD   2 Units at 03/10/24 2132   insulin  aspart (novoLOG ) injection 0-9 Units  0-9 Units Subcutaneous TID WC Ezenduka, Nkeiruka J, MD   1 Units at 03/10/24 1811  metoprolol  tartrate (LOPRESSOR ) tablet 25 mg  25 mg Oral BID Payne, John D, PA-C   25 mg at 03/10/24 2050   nitroGLYCERIN  (NITROSTAT ) SL tablet 0.4 mg  0.4 mg Sublingual Q5 Min x 3 PRN Florencio Hunting, MD       ondansetron  (ZOFRAN ) injection 4 mg  4 mg Intravenous Q4H PRN Florencio Hunting, MD       Oral care mouth rinse  15 mL Mouth Rinse PRN Florencio Hunting, MD       rosuvastatin  (CRESTOR ) tablet 40 mg  40 mg Oral Daily Florencio Hunting, MD   40 mg at 03/10/24 0944   sodium chloride  flush (NS) 0.9 % injection 3-10 mL  3-10 mL Intravenous Q12H Borden, Lester, MD   3 mL at 03/10/24 2052   tamsulosin  (FLOMAX ) capsule 0.4 mg  0.4 mg Oral Daily Florencio Hunting, MD   0.4 mg at 03/10/24 0945   traZODone  (DESYREL ) tablet 25-50 mg  25-50 mg Oral QHS PRN Florencio Hunting, MD   50 mg at 03/10/24 2051    PHYSICAL EXAMINATION: ECOG PERFORMANCE STATUS: {CHL ONC ECOG [phone]}  There were no vitals filed for this visit. Wt Readings from Last 3 Encounters:   03/09/24 225 lb 14.4 oz (102.5 kg)  02/21/24 218 lb (98.9 kg)  02/21/24 209 lb 8 oz (95 kg)    *** GENERAL:alert, no distress and comfortable SKIN: skin color, texture, turgor are normal, no rashes or significant lesions EYES: normal, Conjunctiva are pink and non-injected, sclera clear {OROPHARYNX:no exudate, no erythema and lips, buccal mucosa, and tongue normal}  NECK: supple, thyroid  normal size, non-tender, without nodularity LYMPH:  no palpable lymphadenopathy in the cervical, axillary {or inguinal} LUNGS: clear to auscultation and percussion with normal breathing effort HEART: regular rate & rhythm and no murmurs and no lower extremity edema ABDOMEN:abdomen soft, non-tender and normal bowel sounds Musculoskeletal:no cyanosis of digits and no clubbing  NEURO: alert & oriented x 3 with fluent speech, no focal motor/sensory deficits  Physical Exam   LABORATORY DATA:  I have reviewed the data as listed    Latest Ref Rng & Units 03/10/2024    5:35 AM 03/09/2024   11:13 AM 03/08/2024    4:16 PM  CBC  WBC 4.0 - 10.5 K/uL 11.8  13.6    Hemoglobin 13.0 - 17.0 g/dL 8.3  8.5  8.9   Hematocrit 39.0 - 52.0 % 26.6  26.8  27.6   Platelets 150 - 400 K/uL 147  126          Latest Ref Rng & Units 03/10/2024    5:35 AM 03/09/2024   11:13 AM 03/08/2024    2:43 AM  CMP  Glucose 70 - 99 mg/dL 161  096  045   BUN 8 - 23 mg/dL 43  43  57   Creatinine 0.61 - 1.24 mg/dL 4.09  8.11  9.14   Sodium 135 - 145 mmol/L 137  139  136   Potassium 3.5 - 5.1 mmol/L 4.2  4.3  5.0   Chloride 98 - 111 mmol/L 109  109  107   CO2 22 - 32 mmol/L 22  23  21    Calcium  8.9 - 10.3 mg/dL 8.0  8.0  7.8       RADIOGRAPHIC STUDIES: I have personally reviewed the radiological images as listed and agreed with the findings in the report. 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 Left percutaneous nephrostomy tube was recently placed on 03/07/2024.  Urology requested a nephrostogram to evaluate the renal collecting system and look for any obstruction. 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 EXAM: 1. LEFT NEPHROSTOGRAM THROUGH EXISTING ACCESS 2. EXCHANGE OF LEFT NEPHROSTOMY TUBE WITH FLUOROSCOPY Physician: Olive Better. Julietta Ogren, MD COMPARISON:  None Available. MEDICATIONS: 1% lidocaine  ANESTHESIA/SEDATION: None CONTRAST:  15mL OMNIPAQUE  IOHEXOL  300 MG/ML SOLN - administered into the collecting system(s) FLUOROSCOPY: Radiation Exposure Index (as provided by the fluoroscopic device): 76 mGy Kerma COMPLICATIONS: None immediate. PROCEDURE: Patient was placed prone on the interventional table. Scout image was obtained. Left nephrostogram was performed through the nephrostomy tube. The nephrostomy tube was partially dislodged. As a result, we proceeded with a left nephrostomy tube exchange. The procedure was explained to the patient. The risks and benefits of the procedure were discussed and the patient's questions were addressed. Informed consent was obtained from the patient. The existing catheter and surrounding skin were prepped and draped in sterile fashion. Maximal barrier sterile technique was utilized including caps, mask, sterile gowns, sterile gloves, sterile drape, hand hygiene and skin antiseptic. Contrast was injected through the nephrostomy tube. Nephrostomy tube was cut and removed over a wire. New 10 French multipurpose drain was advanced over the wire and reconstituted in the renal pelvis. Contrast injection confirmed placement in the renal pelvis. Skin was anesthetized with 1% lidocaine . Nephrostomy tube was sutured to skin. Nephrostomy tube was flushed with saline and capped. Fluoroscopic images were taken and saved for this procedure. FINDINGS: Left nephrostomy tube was positioned in a lower pole calyx and the tube was partially dislodged with some contrast leaking along the drain tract. Mild dilatation the left  renal collecting system and left ureter. Left ureter was tortuous. Small segment of the distal ureter was difficult to opacify but no suspicious filling defects in the left renal collecting system. Contrast was spontaneously draining into the urinary bladder. Left nephrostomy tube was exchanged and the new nephrostomy tube was positioned in the left renal pelvis. IMPRESSION: 1. Left ureter is patent with drainage into the urinary bladder. 2. Mild dilatation of the left ureter without suspicious filling defects or gross abnormalities. 3. Left nephrostomy tube exchanged because the tube was partially dislodged. Electronically Signed   By: Elene Griffes M.D.   On: 03/10/2024 15:11      No orders of the defined types were placed in this encounter.  All questions were answered. The patient knows to call the clinic with any problems, questions or concerns. No barriers to learning was detected. The total time spent in the appointment was {CHL ONC TIME VISIT - [id number]}.     Sonja Mansfield, MD 03/10/2024

## 2024-03-10 NOTE — Procedures (Signed)
 Interventional Radiology Procedure:   Indications: Evaluate left renal collecting system  Procedure: 1) Nephrostogram 2) Left nephrostomy tube exchange  Findings: Left renal collecting system is patent without filling defects or gross abnormality.  Spontaneous drainage into bladder.  Nephrostomy was partially dislodged.   New 10 Fr drain in renal pelvis after exchange.   Complications: None     EBL: Minimal  Plan: Discharged with Dr. Rozanne Corners.  Left nephrostomy is now capped.   Mishael Haran R. Julietta Ogren, MD  Pager: [phone]

## 2024-03-11 ENCOUNTER — Encounter: Payer: Self-pay | Admitting: Hematology

## 2024-03-11 ENCOUNTER — Inpatient Hospital Stay (HOSPITAL_COMMUNITY)

## 2024-03-11 ENCOUNTER — Encounter (HOSPITAL_COMMUNITY)

## 2024-03-11 DIAGNOSIS — D62 Acute posthemorrhagic anemia: Secondary | ICD-10-CM | POA: Diagnosis not present

## 2024-03-11 DIAGNOSIS — N179 Acute kidney failure, unspecified: Secondary | ICD-10-CM | POA: Diagnosis not present

## 2024-03-11 DIAGNOSIS — R000 Tachycardia, unspecified: Secondary | ICD-10-CM | POA: Diagnosis not present

## 2024-03-11 DIAGNOSIS — I2510 Atherosclerotic heart disease of native coronary artery without angina pectoris: Secondary | ICD-10-CM

## 2024-03-11 DIAGNOSIS — I4891 Unspecified atrial fibrillation: Secondary | ICD-10-CM

## 2024-03-11 LAB — BASIC METABOLIC PANEL WITH GFR
Anion gap: 5 (ref 5–15)
BUN: 35 mg/dL — ABNORMAL HIGH (ref 8–23)
CO2: 24 mmol/L (ref 22–32)
Calcium: 8.3 mg/dL — ABNORMAL LOW (ref 8.9–10.3)
Chloride: 110 mmol/L (ref 98–111)
Creatinine, Ser: 2.07 mg/dL — ABNORMAL HIGH (ref 0.61–1.24)
GFR, Estimated: 32 mL/min — ABNORMAL LOW (ref >60)
Glucose, Bld: 141 mg/dL — ABNORMAL HIGH (ref 70–99)
Potassium: 4.4 mmol/L (ref 3.5–5.1)
Sodium: 139 mmol/L (ref 135–145)

## 2024-03-11 LAB — HEMOGLOBIN AND HEMATOCRIT, BLOOD
HCT: 28.9 % — ABNORMAL LOW (ref 39.0–52.0)
Hemoglobin: 8.9 g/dL — ABNORMAL LOW (ref 13.0–17.0)

## 2024-03-11 LAB — GLUCOSE, CAPILLARY
Glucose-Capillary: 130 mg/dL — ABNORMAL HIGH (ref 70–99)
Glucose-Capillary: 180 mg/dL — ABNORMAL HIGH (ref 70–99)
Glucose-Capillary: 157 mg/dL — ABNORMAL HIGH (ref 70–99)
Glucose-Capillary: 164 mg/dL — ABNORMAL HIGH (ref 70–99)

## 2024-03-11 MED ORDER — ASPIRIN 81 MG PO TBEC
81.0000 mg | DELAYED_RELEASE_TABLET | Freq: Every day | ORAL | Status: DC
  Administered 2024-03-11 – 2024-03-13 (×3): 81 mg via ORAL
  Filled 2024-03-11 (×3): qty 1

## 2024-03-11 MED ORDER — MUPIROCIN 2 % EX OINT
1.0000 | TOPICAL_OINTMENT | Freq: Two times a day (BID) | CUTANEOUS | Status: DC
  Administered 2024-03-11 – 2024-03-13 (×5): 1 via NASAL
  Filled 2024-03-11 (×2): qty 22

## 2024-03-11 NOTE — Plan of Care (Signed)
  Problem: Education: Goal: Knowledge of General Education information will improve Description: Including pain rating scale, medication(s)/side effects and non-pharmacologic comfort measures Outcome: Progressing   Problem: Health Behavior/Discharge Planning: Goal: Ability to manage health-related needs will improve Outcome: Progressing   Problem: Clinical Measurements: Goal: Ability to maintain clinical measurements within normal limits will improve Outcome: Progressing Goal: Will remain free from infection Outcome: Progressing Goal: Diagnostic test results will improve Outcome: Progressing Goal: Respiratory complications will improve Outcome: Progressing Goal: Cardiovascular complication will be avoided Outcome: Progressing   Problem: Activity: Goal: Risk for activity intolerance will decrease Outcome: Progressing   Problem: Nutrition: Goal: Adequate nutrition will be maintained Outcome: Progressing   Problem: Coping: Goal: Level of anxiety will decrease Outcome: Progressing   Problem: Elimination: Goal: Will not experience complications related to bowel motility Outcome: Progressing Goal: Will not experience complications related to urinary retention Outcome: Progressing   Problem: Pain Managment: Goal: General experience of comfort will improve and/or be controlled Outcome: Progressing   Problem: Safety: Goal: Ability to remain free from injury will improve Outcome: Progressing   Problem: Skin Integrity: Goal: Risk for impaired skin integrity will decrease Outcome: Progressing   Problem: Education: Goal: Knowledge of the prescribed therapeutic regimen will improve Outcome: Progressing   Problem: Bowel/Gastric: Goal: Gastrointestinal status for postoperative course will improve Outcome: Progressing   Problem: Health Behavior/Discharge Planning: Goal: Identification of resources available to assist in meeting health care needs will improve Outcome:  Progressing   Problem: Skin Integrity: Goal: Demonstration of wound healing without infection will improve Outcome: Progressing   Problem: Urinary Elimination: Goal: Ability to avoid or minimize complications of infection will improve Outcome: Progressing   Problem: Education: Goal: Ability to describe self-care measures that may prevent or decrease complications (Diabetes Survival Skills Education) will improve Outcome: Progressing Goal: Individualized Educational Video(s) Outcome: Progressing   Problem: Coping: Goal: Ability to adjust to condition or change in health will improve Outcome: Progressing   Problem: Fluid Volume: Goal: Ability to maintain a balanced intake and output will improve Outcome: Progressing   Problem: Health Behavior/Discharge Planning: Goal: Ability to identify and utilize available resources and services will improve Outcome: Progressing Goal: Ability to manage health-related needs will improve Outcome: Progressing   Problem: Metabolic: Goal: Ability to maintain appropriate glucose levels will improve Outcome: Progressing   Problem: Nutritional: Goal: Maintenance of adequate nutrition will improve Outcome: Progressing Goal: Progress toward achieving an optimal weight will improve Outcome: Progressing   Problem: Skin Integrity: Goal: Risk for impaired skin integrity will decrease Outcome: Progressing   Problem: Tissue Perfusion: Goal: Adequacy of tissue perfusion will improve Outcome: Progressing

## 2024-03-11 NOTE — Evaluation (Signed)
 Physical Therapy Evaluation Patient Details Name: Adam Harris MRN: [id number] DOB: Jul 17, 1946 Today's Date: 03/11/2024  History of Present Illness  Adam Harris is a 78 yo male s/p cystoscopy, urethral dilation, bladder biopsies and fulguration on 4/24. S/p nephrostogram, L nephrostomy tube exchange 4/28. PMH: afib s/p Watchman procedure, CAD, CKD stage IV, heart failure, HTN, diabetes mellitus type 2, bladder cancer.  Clinical Impression  Pt admitted with above diagnosis. Pt reports from home, ind without AD, does have SPC, spouse and 2 adult children live with pt, bedroom/shower on 2nd level. On eval, pt limited by R foot/ankle pain, noted tenderness to palpation at medial arch, reports of increased pain with weightbearing. Pt demonstrates slow, step to gait pattern, limited R stance time and weightbearing, able to progress to step through pattern briefly but returns to step-to due to pain. Pt needing min A to steady with transfers and BUE assisting to transfer. Recommend HHPT at d/c, will continue to assess DME needs (RW vs rollator pending improvement). Pt currently with functional limitations due to the deficits listed below (see PT Problem List). Pt will benefit from acute skilled PT to increase their independence and safety with mobility to allow discharge.           If plan is discharge home, recommend the following: A little help with walking and/or transfers;A little help with bathing/dressing/bathroom;Assistance with cooking/housework;Assist for transportation   Can travel by private vehicle        Equipment Recommendations Other (comment) (RW vs rollator pending improvement)  Recommendations for Other Services       Functional Status Assessment Patient has had a recent decline in their functional status and demonstrates the ability to make significant improvements in function in a reasonable and predictable amount of time.     Precautions / Restrictions Precautions Precautions:  Fall Precaution/Restrictions Comments: L nephrostomy Restrictions Weight Bearing Restrictions Per Provider Order: No      Mobility  Bed Mobility Overal bed mobility: Needs Assistance Bed Mobility: Supine to Sit     Supine to sit: Supervision, HOB elevated, Used rails     General bed mobility comments: increased time and effort to come to sitting EOB, using bedrails and HOB elevated    Transfers Overall transfer level: Needs assistance Equipment used: Rolling walker (2 wheels) Transfers: Sit to/from Stand Sit to Stand: Min assist           General transfer comment: min A to steady with powering to stand, verbal cues for hand palcement to push from seated surface and use handrails in bathroom    Ambulation/Gait Ambulation/Gait assistance: Contact guard assist   Assistive device: Rolling walker (2 wheels) Gait Pattern/deviations: Step-to pattern, Step-through pattern, Decreased stride length, Decreased stance time - right, Decreased weight shift to right, Trunk flexed Gait velocity: decreased     General Gait Details: gait pattern varies, initially step to but improves to step through then back to step-to with distance due to R foot/ankle pain complaints, good steadiness, increased time and effort  Stairs            Wheelchair Mobility     Tilt Bed    Modified Rankin (Stroke Patients Only)       Balance                                             Pertinent Vitals/Pain Pain Assessment Pain  Assessment: Faces Faces Pain Scale: Hurts even more Pain Location: R foot/ankle with weightbearing Pain Descriptors / Indicators: Discomfort, Grimacing Pain Intervention(s): Limited activity within patient's tolerance, Monitored during session, Repositioned, Other (comment) (notified MD)    Home Living Family/patient expects to be discharged to:: Private residence Living Arrangements: Spouse/significant other;Children Available Help at  Discharge: Family;Available 24 hours/day Type of Home: House Home Access: Level entry     Alternate Level Stairs-Number of Steps: flight Home Layout: Two level;1/2 bath on main level;Bed/bath upstairs Home Equipment: Grab bars - tub/shower;Cane - single point      Prior Function Prior Level of Function : Independent/Modified Independent;Driving             Mobility Comments: pt reports ind with community amb, denies falls in last 6 months, retired high school football coach ADLs Comments: pt reports ind with self care and household chores     Extremity/Trunk Assessment   Upper Extremity Assessment Upper Extremity Assessment: Overall WFL for tasks assessed    Lower Extremity Assessment Lower Extremity Assessment: Overall WFL for tasks assessed (AROM WFL, strength grossly 4/5, denies numbness/tingling throughout, tenderness to palpation across R medial arch)    Cervical / Trunk Assessment Cervical / Trunk Assessment: Kyphotic  Communication   Communication Communication: No apparent difficulties    Cognition Arousal: Alert Behavior During Therapy: WFL for tasks assessed/performed   PT - Cognitive impairments: No apparent impairments                       PT - Cognition Comments: pt a&o x4, initially slow to answer questions but improves with sitting EOB and standing Following commands: Intact       Cueing       General Comments      Exercises     Assessment/Plan    PT Assessment Patient needs continued PT services  PT Problem List Decreased activity tolerance;Decreased balance;Decreased knowledge of use of DME;Pain       PT Treatment Interventions DME instruction;Gait training;Stair training;Functional mobility training;Therapeutic activities;Therapeutic exercise;Balance training;Patient/family education    PT Goals (Current goals can be found in the Care Plan section)  Acute Rehab PT Goals Patient Stated Goal: "I wish my right foot would  quit hurting" PT Goal Formulation: With patient Time For Goal Achievement: 03/25/24 Potential to Achieve Goals: Good    Frequency Min 3X/week     Co-evaluation               AM-PAC PT "6 Clicks" Mobility  Outcome Measure Help needed turning from your back to your side while in a flat bed without using bedrails?: A Little Help needed moving from lying on your back to sitting on the side of a flat bed without using bedrails?: A Little Help needed moving to and from a bed to a chair (including a wheelchair)?: A Little Help needed standing up from a chair using your arms (e.g., wheelchair or bedside chair)?: A Little Help needed to walk in hospital room?: A Little Help needed climbing 3-5 steps with a railing? : A Lot 6 Click Score: 17    End of Session Equipment Utilized During Treatment: Gait belt Activity Tolerance: Patient tolerated treatment well Patient left: in bed;with call bell/phone within reach;with bed alarm set Nurse Communication: Mobility status;Other (comment) (R foot/ankle pain- notified RN and MD) PT Visit Diagnosis: Other abnormalities of gait and mobility (R26.89);Pain;Difficulty in walking, not elsewhere classified (R26.2) Pain - Right/Left: Right Pain - part of body: Ankle  and joints of foot    Time: 0950-1026 PT Time Calculation (min) (ACUTE ONLY): 36 min   Charges:   PT Evaluation $PT Eval Moderate Complexity: 1 Mod PT Treatments $Gait Training: 8-22 mins PT General Charges $$ ACUTE PT VISIT: 1 Visit         Tori Juanisha Bautch PT, DPT 03/11/24, 10:33 AM

## 2024-03-11 NOTE — Progress Notes (Addendum)
 PROGRESS NOTE    Adam Harris  ZOX:[id number] DOB: [birth year] DOA: 03/06/2024 PCP: Graig Lawyer, MD   Brief Narrative: Adam Harris is a 78 y.o. male with a history of atrial fibrillation status post Watchman procedure, CAD, CKD stage IV, heart failure, hypertension, diabetes mellitus type 2, bladder cancer.  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  Medicine consulted for management of chronic medical issues in addition to tachyarrhythmia.   Assessment and Plan:  Tachyarrhythmia with wide complex Paroxysmal atrial fibrillation s/p watchman Known right bundle branch block. Tachycardia presumed secondary to acute anemia.  Patient has a history of Watchman procedure and is currently off Eliquis  but is currently on Plavix  with plan to continue through to July 2025. -Continue telemetry -Continue metoprolol  BID -Cardiology consulted on 4/28  Acute blood loss anemia Secondary to hematuria from hemorrhagic cystitis. Patient received a total of 3 units of PRBC.  Gross hematuria Managed per urology. No improvement after repeat operative fulguration. Patient required bladder irrigation. Left nephrostomy tube placed byiR.  Leukocytosis Likely reactive.  Thrombocytopenia Mild. Likely secondary to acute blood loss. Improving.  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 Baseline creatinine of about 1.8-2. Creatinine peak of 2.85, now improving with nephrostomy tube in place.  Diabetes mellitus, type 2 Well controlled with hemoglobin A1C of 6.8%. -Continue SSI  Primary hypertension -Continue metoprolol   CAD -Continue metoprolol  and Crestor  -Plavix  held secondary to bleed; resume per primary  Chronic diastolic heart failure Noted. Euvolemic.  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  Patient is refractory to BCG.  Patient is currently being  treated with IV pembrolizumab .  Patient follows with urology as an outpatient.  Right foot pain Unclear etiology. No known injury. Not consistent with arthropathy. -Check foot x-ray.   DVT prophylaxis: Per primary Code Status:   Code Status: Full Code Family Communication: None at bedside Disposition Plan: Per primary   Subjective: Some right foot pain yesterday which has improved.  Objective: BP (!) 142/90 (BP Location: Right Arm)   Pulse 69   Temp 98.6 F (37 C) (Oral)   Resp 20   Ht 5\' 11"  (1.803 m)   Wt 102.5 kg   SpO2 92%   BMI 31.51 kg/m   Examination:  General exam: Appears calm and comfortable Respiratory system: Clear to auscultation. Respiratory effort normal. Cardiovascular system: S1 & S2 heard, RRR. No murmurs. Gastrointestinal system: Abdomen is nondistended, soft and nontender. Normal bowel sounds heard. Central nervous system: Alert and oriented. No focal neurological deficits. Musculoskeletal: No edema. No calf tenderness. Mild tenderness on dorsum of right foot Psychiatry: Judgement and insight appear normal. Mood & affect appropriate.    Data Reviewed: I have personally reviewed following labs and imaging studies  CBC Lab Results  Component Value Date   WBC 11.8 (H) 03/10/2024   RBC 3.00 (L) 03/10/2024   HGB 8.9 (L) 03/11/2024   HCT 28.9 (L) 03/11/2024   MCV 88.7 03/10/2024   MCH 27.7 03/10/2024   PLT 147 (L) 03/10/2024   MCHC 31.2 03/10/2024   RDW 16.9 (H) 03/10/2024   LYMPHSABS 1.6 03/10/2024   MONOABS 1.5 (H) 03/10/2024   EOSABS 0.3 03/10/2024   BASOSABS 0.1 03/10/2024     Last metabolic panel Lab Results  Component Value Date   NA 139 03/11/2024   K 4.4 03/11/2024   CL 110 03/11/2024   CO2 24 03/11/2024  BUN 35 (H) 03/11/2024   CREATININE 2.07 (H) 03/11/2024   GLUCOSE 141 (H) 03/11/2024   GFRNONAA 32 (L) 03/11/2024   GFRAA 32 (L) 08/05/2020   CALCIUM  8.3 (L) 03/11/2024   PHOS 4.2 08/06/2023   PROT 7.5 02/21/2024    ALBUMIN  3.6 02/21/2024   BILITOT 2.0 (H) 02/21/2024   ALKPHOS 125 02/21/2024   AST 12 (L) 02/21/2024   ALT 11 02/21/2024   ANIONGAP 5 03/11/2024    GFR: Estimated Creatinine Clearance: 36.4 mL/min (A) (by C-G formula based on SCr of 2.07 mg/dL (H)).  Recent Results (from the past 240 hours)  MRSA Next Gen by PCR, Nasal     Status: Abnormal   Collection Time: 03/07/24  2:04 AM   Specimen: Nasal Mucosa; Nasal Swab  Result Value Ref Range Status   MRSA by PCR Next Gen DETECTED (A) NOT DETECTED Final    Comment: (NOTE) The GeneXpert MRSA Assay (FDA approved for NASAL specimens only), is one component of a comprehensive MRSA colonization surveillance program. It is not intended to diagnose MRSA infection nor to guide or monitor treatment for MRSA infections. Test performance is not FDA approved in patients less than 101 years old. Performed at [facility] ([facility] [address], [place], [place] [address]       Radiology Studies: IR NEPHROSTOMY EXCHANGE LEFT Result Date: 03/10/2024 INDICATION: 78 year old with hemorrhagic cystitis. Left percutaneous nephrostomy tube was recently placed on 03/07/2024. Urology requested a nephrostogram to evaluate the renal collecting system and look for any obstruction. 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 EXAM: 1. LEFT NEPHROSTOGRAM THROUGH EXISTING ACCESS 2. EXCHANGE OF LEFT NEPHROSTOMY TUBE WITH FLUOROSCOPY Physician: Olive Better. Julietta Ogren, MD COMPARISON:  None Available. MEDICATIONS: 1% lidocaine  ANESTHESIA/SEDATION: None CONTRAST:  15mL OMNIPAQUE  IOHEXOL  300 MG/ML SOLN - administered into the collecting system(s) FLUOROSCOPY: Radiation Exposure Index (as provided by the fluoroscopic device): 76 mGy Kerma COMPLICATIONS: None immediate. PROCEDURE: Patient was placed prone on the interventional table. Scout image was obtained. Left nephrostogram was performed through the nephrostomy tube. The nephrostomy  tube was partially dislodged. As a result, we proceeded with a left nephrostomy tube exchange. The procedure was explained to the patient. The risks and benefits of the procedure were discussed and the patient's questions were addressed. Informed consent was obtained from the patient. The existing catheter and surrounding skin were prepped and draped in sterile fashion. Maximal barrier sterile technique was utilized including caps, mask, sterile gowns, sterile gloves, sterile drape, hand hygiene and skin antiseptic. Contrast was injected through the nephrostomy tube. Nephrostomy tube was cut and removed over a wire. New 10 French multipurpose drain was advanced over the wire and reconstituted in the renal pelvis. Contrast injection confirmed placement in the renal pelvis. Skin was anesthetized with 1% lidocaine . Nephrostomy tube was sutured to skin. Nephrostomy tube was flushed with saline and capped. Fluoroscopic images were taken and saved for this procedure. FINDINGS: Left nephrostomy tube was positioned in a lower pole calyx and the tube was partially dislodged with some contrast leaking along the drain tract. Mild dilatation the left renal collecting system and left ureter. Left ureter was tortuous. Small segment of the distal ureter was difficult to opacify but no suspicious filling defects in the left renal collecting system. Contrast was spontaneously draining into the urinary bladder. Left nephrostomy tube was exchanged and the new nephrostomy tube was positioned in the left renal pelvis. IMPRESSION: 1. Left ureter is patent with drainage into the urinary  bladder. 2. Mild dilatation of the left ureter without suspicious filling defects or gross abnormalities. 3. Left nephrostomy tube exchanged because the tube was partially dislodged. Electronically Signed   By: Elene Griffes M.D.   On: 03/10/2024 15:11      LOS: 4 days    Aneita Keens, MD [facility] 03/11/2024, 10:04 AM   If 7PM-7AM, please  contact night-coverage [email or web address]

## 2024-03-11 NOTE — Assessment & Plan Note (Signed)
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  He has had multiple BCG maintenance therapy, is BCG refractory. -Given his BCG refractory disease, recurrence in bladder and the urethra, I recommend systemic therapy with Keytruda every 3 weeks (or every 6 weeks if he tolerates well) for 2 years, per NCCN guideline.  He had no history of autoimmune disease, overall in good health, there is no contraindication for immunotherapy. --he started on 06/15/2023. C4 was postponed due to dyspnea and hypoxia on exertion, CT chest was negative for pneumonitis  -repeated cystoscopy and cytology was still positive in early Nov 2024. I discussed with Dr. Laverle Patter and we decide continue Keytruda for additional 3 months and re-evaluate

## 2024-03-11 NOTE — Consult Note (Signed)
 Cardiology Consultation   Patient ID: Adam Harris MRN: [id number]; DOB: 03-04-1946  Admit date: 03/06/2024 Date of Consult: 03/11/2024  PCP:  Graig Lawyer, MD   [facility] Providers Cardiologist:  Eilleen Grates, MD  Electrophysiologist:  Ardeen Kohler, MD    Patient Profile:   Adam Harris is a 78 y.o. male with a hx of permanent atrial fibrillation s/p Watchman procedure, CAD, CKD stage IV, heart failure, hypertension, type 2 diabetes, bladder cancer who is being seen 03/11/2024 for the evaluation of tachycardia at the request of Dr. Rozanne Corners.  History of Present Illness:   Adam Harris is a 78 year old male with above medical history who is followed by Dr. Lavonne Prairie, Dr. Daneil Dunker.  Patient has history of permanent atrial fibrillation and coronary artery disease.  Cardiac catheterization 11/2016 showed CTO proximal RCA with collateral flow, 45% oLAD, 80% OM 3, 100% m-d left circumflex (aneurysmal, not amenable to PCI), 50% lateral OM 3 stenoses.  Medical therapy was recommended.  He later underwent echocardiogram in 05/2022 that showed EF 60-65%, no regional wall motion abnormalities, moderate LVH, normal RV systolic function, mild MR.  In 09/2023, patient was seen by Dr. Lavonne Prairie in clinic.  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  He was referred for consideration of watchman, ultimately underwent Watchman procedure on 12/13/2023.  Patient was last seen by Dr. Lavonne Prairie on 02/15/2024.  At that time, patient denied palpitations, shortness of breath, PND, orthopnea.  His weight was up a little bit and he had not been taking his Lasix .  He was instructed to resume Lasix .  He was pending cystoscopy, was cleared from a cardiovascular standpoint  Patient presented on 4/24 for surveillance cystoscopy and transurethral bladder biopsies by urology.  Postop patient noted to have  significant bleeding, clotting off his catheter.  He returned to the OR, underwent fulguration. Hemoglobin had dropped from 13 to 8.7 and BP was soft, He was admitted to SDU. On 4/25, IR placed left nephrostomy tube. Patient developed tachycardia and PCCM was consulted.  PCCM suspected that tachycardia was due to anemia with significant hemoglobin drop.  Patient also had not received his metoprolol .  He was started back on metoprolol .  On interview, patient reports feeling well today.  He denies having any issues with palpitations or feeling of elevated heart rates.  Denies chest pain, shortness of breath.  He was able to walk around with physical therapy a bit today.  Did note that he got a bit more tired than he normally would have.  He denies dizziness, syncope, near syncope.  Reports that he has been doing a good job of staying hydrated. EKG yesterday showed atrial fibrillation with heart rate 71 bpm, occasional aberrantly conducted beats on telemetry, patient has been in atrial fibrillation with heart rate predominantly in the 70s.  He has occasional aberrantly conducted beats   Past Medical History:  Diagnosis Date   Abnormal radiologic findings on diagnostic imaging of renal pelvis, ureter, or bladder    bilateral ureter abnormalities   Anticoagulant long-term use    eliquis    Anxiety    pt denies   Arthritis    Atrial fibrillation, chronic (HCC)    CAD (coronary artery disease) cardiologist-- dr hochrein   NSTEMI 02-04-2014  per cardiac cath chronic occluded RCA w/ faint left-to-right collaterals and aneurysmal LCFx with sluggish coronary flow/   NSTEMI --11-21-2016 per  cardiac cath occluded proximal RCA & mid to diastal CFX 100%, med rx. If that does not work, PTCA or CABG   Cancer [facility])    bladder and kidney   CHF (congestive heart failure) (HCC)    CKD (chronic kidney disease), stage III (HCC)    patient unaware   DOE (dyspnea on exertion)    Fatty liver    pt denies   Hematuria  02/2019   History of COVID-19 10/2019   History of non-ST elevation myocardial infarction (NSTEMI)    02-04-2014  and 11-21-2016  cardiac cath done both times ,  medically management   History of shingles 12/2017   slight pain and numbness still noted in the area   Hyperlipidemia    Hypertension    Insomnia    Myocardial infarction [facility]) 2015   Persistent atrial fibrillation [facility])    cardiologsit-- dr hochrein   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 Daneil Dunker   Thoracic aortic atherosclerosis (HCC)    Type 2 diabetes mellitus (HCC)    Urinary frequency     Past Surgical History:  Procedure Laterality Date   CARDIAC CATHETERIZATION N/A 11/21/2016   Procedure: Left Heart Cath and Coronary Angiography;  Surgeon: Millicent Ally, MD;  Location: [facility] INVASIVE CV LAB;  Service: Cardiovascular;  Laterality: N/A;  pRCA 100% , ostial LAD 45%, OM3 80%, mCFX to dCFX 100% (AV groove), lateral OM3 50%   COLONOSCOPY     CYSTOSCOPY WITH BIOPSY N/A 08/12/2020   Procedure: CYSTOSCOPY WITH BLADDER BIOPSY AND TRANSURETHRAL RESECTION OF BLADDER TUMOR;  Surgeon: Florencio Hunting, MD;  Location: [facility] ORS;  Service: Urology;  Laterality: N/A;   CYSTOSCOPY WITH BIOPSY N/A 11/10/2021   Procedure: CYSTOSCOPY WITH BLADDER BIOPSIES/ LEFT RETROGRADE;  Surgeon: Florencio Hunting, MD;  Location: [facility];  Service: Urology;  Laterality: N/A;   CYSTOSCOPY WITH BIOPSY Left 07/27/2022   Procedure: CYSTOSCOPY WITH  BLADDER BIOPSY, TRANSURETHRAL FULGERATION OF BLADDER, EXAM UNDER ANESTHESIA, LEFT RETROGRADE PYELOGRAM;  Surgeon: Florencio Hunting, MD;  Location: [facility];  Service: Urology;  Laterality: Left;   CYSTOSCOPY WITH BIOPSY N/A 04/23/2023   Procedure: CYSTOSCOPY WITH BLADDER AND PROSTATIC URETHRAL BIOPSIES;  Surgeon: Florencio Hunting, MD;  Location: [facility];  Service: Urology;  Laterality: N/A;  60 MINUTES NEEDED FOR CASE   CYSTOSCOPY WITH BIOPSY N/A 03/06/2024    Procedure: CYSTOSCOPY, WITH BIOPSY;  Surgeon: Florencio Hunting, MD;  Location: [facility];  Service: Urology;  Laterality: N/A;  CYSTOSCOPY WITH BLADDER BIOPSIES   CYSTOSCOPY WITH FULGERATION N/A 03/06/2024   Procedure: CYSTOSCOPY, WITH BLADDER FULGURATION, TURBT;  Surgeon: Florencio Hunting, MD;  Location: [facility];  Service: Urology;  Laterality: N/A;   CYSTOSCOPY WITH URETEROSCOPY AND STENT PLACEMENT Right 12/15/2019   Procedure: CYSTOSCOPY WITH RIGHT RETROGRADE/ RIGHT URETEROSCOPY/ BIOPSY;  Surgeon: Ottelin, Mark, MD;  Location: [facility];  Service: Urology;  Laterality: Right;   CYSTOSCOPY/RETROGRADE/URETEROSCOPY Bilateral 03/18/2018   Procedure: CYSTOSCOPY/RETROGRADE/URETEROSCOPY.;  Surgeon: Ottelin, Mark, MD;  Location: [facility];  Service: Urology;  Laterality: Bilateral;   EYE SURGERY Bilateral    cateract in January 2023   HYDROCELE EXCISION Left 07/27/2022   Procedure: HYDROCELE REPAIR;  Surgeon: Florencio Hunting, MD;  Location: [facility];  Service: Urology;  Laterality: Left;  GENERAL ANESTHESIA WITH PARALYSIS   IR NEPHROSTOMY EXCHANGE LEFT  03/10/2024   IR NEPHROSTOMY PLACEMENT LEFT  03/07/2024   KNEE ARTHROSCOPY Right    LEFT ATRIAL  APPENDAGE OCCLUSION N/A 12/13/2023   Procedure: LEFT ATRIAL APPENDAGE OCCLUSION;  Surgeon: Ardeen Kohler, MD;  Location: [facility] INVASIVE CV LAB;  Service: Cardiovascular;  Laterality: N/A;   LEFT HEART CATHETERIZATION WITH CORONARY ANGIOGRAM N/A 02/04/2014   Procedure: LEFT HEART CATHETERIZATION WITH CORONARY ANGIOGRAM;  Surgeon: Wenona Hamilton, MD;  Location: [facility] CATH LAB;  Service: Cardiovascular;  Laterality: N/A;  severe one-vessel CAD, chronically occluded RCA with faint left-to-right collaterals;  aneurysmal LCFx with sluggish coronary flow;  normal LVSF w/ moderately elevated LVEDP (ostialOM2 20%, pOM3 20%, pD1 20%, mCFX 50%, diffuse 20% pCFX)   PROSTATE BIOPSY N/A 08/12/2020   Procedure: BIOPSY TRANSRECTAL ULTRASONIC PROSTATE (TUBP);   Surgeon: Florencio Hunting, MD;  Location: [facility];  Service: Urology;  Laterality: N/A;   ROBOT ASSITED LAPAROSCOPIC NEPHROURETERECTOMY Right 03/22/2020   Procedure: XI ROBOT ASSITED LAPAROSCOPIC NEPHROURETERECTOMY;  Surgeon: Florencio Hunting, MD;  Location: [facility];  Service: Urology;  Laterality: Right;   TRANSESOPHAGEAL ECHOCARDIOGRAM (CATH LAB) N/A 12/13/2023   Procedure: TRANSESOPHAGEAL ECHOCARDIOGRAM;  Surgeon: Ardeen Kohler, MD;  Location: [facility] INVASIVE CV LAB;  Service: Cardiovascular;  Laterality: N/A;   TRANSESOPHAGEAL ECHOCARDIOGRAM (CATH LAB) N/A 02/11/2024   Procedure: TRANSESOPHAGEAL ECHOCARDIOGRAM;  Surgeon: Harrold Lincoln, MD;  Location: [facility] INVASIVE CV LAB;  Service: Cardiovascular;  Laterality: N/A;   TRANSTHORACIC ECHOCARDIOGRAM  11-22-2016   dr hochrein   ef 55-60%/ mild MR and TR/ moderate LAE   TRANSURETHRAL RESECTION OF BLADDER TUMOR N/A 02/10/2021   Procedure: TRANSURETHRAL RESECTION OF BLADDER TUMOR (TURBT)/ CYSTOSCOPY/ LEFT RETROGRADE;  Surgeon: Florencio Hunting, MD;  Location: [facility];  Service: Urology;  Laterality: N/A;  GENERAL ANESTHESIA WITH PARALYSIS      Inpatient Medications: Scheduled Meds:  aspirin  EC  81 mg Oral Daily   Chlorhexidine  Gluconate Cloth  6 each Topical Daily   cholecalciferol  1,000 Units Oral Daily   docusate sodium   100 mg Oral BID   insulin  aspart  0-5 Units Subcutaneous QHS   insulin  aspart  0-9 Units Subcutaneous TID WC   metoprolol  tartrate  25 mg Oral BID   mupirocin  ointment  1 Application Nasal BID   rosuvastatin   40 mg Oral Daily   sodium chloride  flush  3-10 mL Intravenous Q12H   tamsulosin   0.4 mg Oral Daily   Continuous Infusions:  PRN Meds: diphenhydrAMINE  **OR** diphenhydrAMINE , HYDROcodone -acetaminophen , hyoscyamine , nitroGLYCERIN , ondansetron , mouth rinse, traZODone   Allergies:    Allergies  Allergen Reactions   Xarelto  [Rivaroxaban ] Other (See Comments)    Skin Blisters     Social History:   Social History    Socioeconomic History   Marital status: Married    Spouse name: Amalia Badder   Number of children: 3   Years of education: Not on file   Highest education level: Master's degree (e.g., MA, MS, MEng, MEd, MSW, MBA)  Occupational History   Occupation: Retired- Psychologist, occupational    Comment: [employer]  Tobacco Use   Smoking status: Former    Current packs/day: 0.00    Average packs/day: 0.3 packs/day for 4.0 years (1.0 ttl pk-yrs)    Types: Cigarettes    Start date: 1968    Quit date: 1972    Years since quitting: 53.3    Passive exposure: Past   Smokeless tobacco: Former    Types: Chew    Quit date: 1972  Vaping Use   Vaping status: Never Used  Substance and Sexual Activity   Alcohol use: No   Drug use: No   Sexual activity: Yes  Other  Topics Concern   Not on file  Social History Narrative   Not on file   Social Drivers of Health   Financial Resource Strain: Low Risk  (08/06/2023)   Overall Financial Resource Strain (CARDIA)    Difficulty of Paying Living Expenses: Not hard at all  Food Insecurity: No Food Insecurity (03/06/2024)   Hunger Vital Sign    Worried About Running Out of Food in the Last Year: Never true    Ran Out of Food in the Last Year: Never true  Transportation Needs: No Transportation Needs (03/06/2024)   PRAPARE - Administrator, Civil Service (Medical): No    Lack of Transportation (Non-Medical): No  Physical Activity: Inactive (08/06/2023)   Exercise Vital Sign    Days of Exercise per Week: 0 days    Minutes of Exercise per Session: 0 min  Stress: No Stress Concern Present (08/06/2023)   [employer] of Occupational Health - Occupational Stress Questionnaire    Feeling of Stress : Not at all  Social Connections: Moderately Integrated (03/06/2024)   Social Connection and Isolation Panel [NHANES]    Frequency of Communication with Friends and Family: More than three times a week    Frequency of Social Gatherings with Friends and Family: More  than three times a week    Attends Religious Services: More than 4 times per year    Active Member of [employer] or Organizations: No    Attends Banker Meetings: Never    Marital Status: Married  Catering manager Violence: Not At Risk (03/06/2024)   Humiliation, Afraid, Rape, and Kick questionnaire    Fear of Current or Ex-Partner: No    Emotionally Abused: No    Physically Abused: No    Sexually Abused: No    Family History:    Family History  Problem Relation Age of Onset   Hypertension Mother    Cancer Mother        anal cancer   Esophageal cancer Neg Hx    Pancreatic cancer Neg Hx    Stomach cancer Neg Hx    Liver disease Neg Hx      ROS:  Please see the history of present illness.   All other ROS reviewed and negative.     Physical Exam/Data:   Vitals:   03/10/24 0540 03/10/24 0942 03/10/24 2122 03/11/24 0617  BP: 122/77 122/77 120/70 (!) 142/90  Pulse: 72 74 85 69  Resp: 16  20 20   Temp: 98.3 F (36.8 C)  98.6 F (37 C)   TempSrc: Oral  Oral   SpO2: 96% 91% 90% 92%  Weight:      Height:        Intake/Output Summary (Last 24 hours) at 03/11/2024 1124 Last data filed at 03/11/2024 1100 Gross per 24 hour  Intake 480 ml  Output 1150 ml  Net -670 ml      03/09/2024    5:00 PM 03/07/2024   12:02 AM 03/06/2024   11:46 AM  Last 3 Weights  Weight (lbs) 225 lb 14.4 oz 217 lb 9.5 oz 209 lb 8 oz  Weight (kg) 102.468 kg 98.7 kg 95.029 kg     Body mass index is 31.51 kg/m.  General: Elderly male, sitting comfortably in the bed with head elevated.  No acute distress HEENT: normal Neck: no JVD Vascular: Radial pulses 2+ bilaterally Cardiac:  normal S1, S2; irregular rate rhythm, no murmur Lungs:  clear to auscultation bilaterally, no wheezing, rhonchi or rales.  Normal work of breathing on room air Abd: soft, nontender  Ext: no edema in bilateral lower extremities Musculoskeletal:  No deformities Skin: warm and dry  Neuro: no focal abnormalities  noted Psych:  Normal affect   EKG:  The EKG was personally reviewed and demonstrates:   EKG yesterday showed atrial fibrillation with heart rate 71 bpm, occasional aberrantly conducted beats on telemetry, patient has been in atrial fibrillation with heart rate predominantly in the 70s. Telemetry:  Telemetry was personally reviewed and demonstrates: Atrial fibrillation, heart rate in the 60s-70s, occasional aberrancy noted  Relevant CV Studies: Cardiac Studies & Procedures   ______________________________________________________________________________________________ CARDIAC CATHETERIZATION  CARDIAC CATHETERIZATION 11/21/2016  Conclusion  Prox RCA lesion, 100 %stenosed.  Ost LAD lesion, 45 %stenosed.  3rd Mrg lesion, 80 %stenosed.  Mid Cx to Dist Cx lesion, 100 %stenosed.  Lat 3rd Mrg lesion, 50 %stenosed.  Multivessel CAD: The LAD had ostial 40 to less than 50% focal stenosis.  There were mild luminal irregularities in the LAD, which extended to the apex but otherwise was without additional significant obstructive disease.  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  The OM1 vessel had 80% stenosis before giving rise to a branch and then had 50% stenosis in the inferior limb.  The AV groove circumflex was 99/100% occluded immediately after the large aneurysmal segment and  there was faint filling of a very large an additional aneurysmal segment with thrombus and very faint filling of the distal posterolateral branches.  There was faint collateralization of the distal posterolateral branches via the inferior branch of the obtuse marginal vessel.  The RCA was totally occluded proximally as had been previously demonstrated and there were faint bridging collaterals supplying the mid RCA which was a nondominant vessel  distally.  LVEDP 18 mm Hg.  RECOMMENDATION: Angiographic findings were reviewed with Dr. Felipe Horton.  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  His RCA was totally occluded at that time.  His RCA is essentially unchanged.  His AV groove circumflex is now occluded, which most likely is the culprit lesion.  The patient had been inadequately been taking his anticoagulation.  Heparin  will be resumed tonight with plans to resume oral anticoagulation.  Initially, the patient will be treated with increasing medical therapy.  He is completely pain-free.  He will be started on nitrates, in addition to increased beta blocker therapy.  He is not a candidate for stenting due to the significant aneurysmal dilatations throughout his vessels.  If he develops increasing symptomatology, consider possible PTCA and/or evaluation for surgical revascularization.  Findings Coronary Findings Diagnostic  Dominance: Left  Left Main The left main immediately bifurcated into an LAD and a dominant left circumflex vessel.  Left Anterior Descending The LAD had ostial 40 to less than 50% focal stenosis.  There were mild luminal irregularities in the LAD, which extended to the apex but otherwise was without additional significant obstructive disease  Left Circumflex The left circumflex vessel was a large dominant vessel that had a large region of aneurysmal dilation in the proximal to mid segment prior to bifurcating into a bifurcating marginal branch and the more distal AV groove circumflex.  The OM1 vessel had 80% stenosis before giving rise to a branch  and then had 50% stenosis in the inferior limb.  The AV groove circumflex was 99/100% occluded immediately after the large aneurysmal segment and then there was faint filling of a very large an additional aneurysmal segment with thrombus and very faint filling of the distal  posterolateral branches.  There was faint collateralization of the distal posterolateral branches via the inferior branch of the obtuse marginal vessel.  Third Obtuse Marginal Branch  Lateral Third Obtuse Marginal Branch  Right Coronary Artery The RCA was totally occluded proximally as had been previously demonstrated and there were faint bridging collaterals supplying the mid RCA which was a nondominant vessel distally.  Acute Marginal Branch Collaterals Acute Mrg filled by collaterals from Ost RCA.  Intervention  No interventions have been documented.     ECHOCARDIOGRAM  ECHOCARDIOGRAM COMPLETE 06/12/2022  Narrative ECHOCARDIOGRAM REPORT    Patient Name:   Adam Harris     Date of Exam: 06/12/2022 Medical Rec #:  [id number]     Height:       71.0 in Accession #:    [id number]    Weight:       224.0 lb Date of Birth:  [birth year]      BSA:          2.213 m Patient Age:    76 years      BP:           114/78 mmHg Patient Gender: M             HR:           67 bpm. Exam Location:  [address]  Procedure: 2D Echo, Cardiac Doppler and Color Doppler  Indications:    R60.0 Lower extremity swelling  History:        Patient has prior history of Echocardiogram examinations, most recent 11/22/2016. CHF, Previous Myocardial Infarction and CAD, Arrythmias:Atrial Fibrillation; Risk Factors:Hypertension, Diabetes and Dyslipidemia. Exertional angina.  Sonographer:    Mylinda Asa RCS Referring Phys: Eilleen Grates  IMPRESSIONS   1. Left ventricular ejection fraction, by estimation, is 60 to 65%. The left ventricle has normal function. The left ventricle has no regional wall motion abnormalities. There is moderate left ventricular hypertrophy of the basal-septal segment. Left ventricular diastolic parameters are indeterminate. 2. Right ventricular systolic function is normal. The right ventricular size is normal. There is mildly elevated pulmonary artery systolic pressure. 3. Left  atrial size was mildly dilated. 4. The mitral valve is normal in structure. Mild mitral valve regurgitation. No evidence of mitral stenosis. 5. The aortic valve is tricuspid. There is mild calcification of the aortic valve. Aortic valve regurgitation is not visualized. Aortic valve sclerosis is present, with no evidence of aortic valve stenosis. 6. The inferior vena cava is normal in size with greater than 50% respiratory variability, suggesting right atrial pressure of 3 mmHg.  FINDINGS Left Ventricle: Left ventricular ejection fraction, by estimation, is 60 to 65%. The left ventricle has normal function. The left ventricle has no regional wall motion abnormalities. The left ventricular internal cavity size was normal in size. There is moderate left ventricular hypertrophy of the basal-septal segment. Left ventricular diastolic parameters are indeterminate.  Right Ventricle: The right ventricular size is normal. No increase in right ventricular wall thickness. Right ventricular systolic function is normal. There is mildly elevated pulmonary artery systolic pressure. The tricuspid regurgitant velocity is 3.18 m/s, and with an assumed right atrial pressure of 3 mmHg, the estimated right ventricular systolic pressure is 43.4 mmHg.  Left  Atrium: Left atrial size was mildly dilated.  Right Atrium: Right atrial size was normal in size.  Pericardium: There is no evidence of pericardial effusion.  Mitral Valve: The mitral valve is normal in structure. Mild mitral valve regurgitation. No evidence of mitral valve stenosis.  Tricuspid Valve: The tricuspid valve is normal in structure. Tricuspid valve regurgitation is mild . No evidence of tricuspid stenosis.  Aortic Valve: The aortic valve is tricuspid. There is mild calcification of the aortic valve. Aortic valve regurgitation is not visualized. Aortic valve sclerosis is present, with no evidence of aortic valve stenosis.  Pulmonic Valve: The pulmonic  valve was normal in structure. Pulmonic valve regurgitation is not visualized. No evidence of pulmonic stenosis.  Aorta: The aortic root is normal in size and structure.  Venous: The inferior vena cava is normal in size with greater than 50% respiratory variability, suggesting right atrial pressure of 3 mmHg.  IAS/Shunts: No atrial level shunt detected by color flow Doppler.   LEFT VENTRICLE PLAX 2D LVIDd:         3.60 cm LVIDs:         1.70 cm LV PW:         1.00 cm LV IVS:        1.50 cm LVOT diam:     2.00 cm LV SV:         72 LV SV Index:   32 LVOT Area:     3.14 cm   RIGHT VENTRICLE RV Basal diam:  3.00 cm RV S prime:     15.80 cm/s TAPSE (M-mode): 1.6 cm RVSP:           43.4 mmHg  LEFT ATRIUM             Index        RIGHT ATRIUM           Index LA diam:        5.30 cm 2.40 cm/m   RA Pressure: 3.00 mmHg LA Vol (A2C):   51.3 ml 23.19 ml/m  RA Area:     17.70 cm LA Vol (A4C):   89.3 ml 40.36 ml/m  RA Volume:   43.00 ml  19.43 ml/m LA Biplane Vol: 68.0 ml 30.73 ml/m AORTIC VALVE LVOT Vmax:   107.47 cm/s LVOT Vmean:  72.133 cm/s LVOT VTI:    0.228 m  AORTA Ao Root diam: 3.60 cm Ao Asc diam:  3.90 cm  TRICUSPID VALVE TR Peak grad:   40.4 mmHg TR Vmax:        318.00 cm/s Estimated RAP:  3.00 mmHg RVSP:           43.4 mmHg  SHUNTS Systemic VTI:  0.23 m Systemic Diam: 2.00 cm  Dorothye Gathers MD Electronically signed by Dorothye Gathers MD Signature Date/Time: 06/12/2022/2:29:34 PM    Final   TEE  ECHO TEE 02/11/2024  Narrative TRANSESOPHOGEAL ECHO REPORT    Patient Name:   Adam Harris  Date of Exam: 02/11/2024 Medical Rec #:  [id number]  Height:       71.0 in Accession #:    [id number] Weight:       219.4 lb Date of Birth:  13-Mar-1946   BSA:          2.193 m Patient Age:    78 years   BP:           156/107 mmHg Patient Gender: M          HR:  82 bpm. Exam Location:  Inpatient  Procedure: 3D Echo, Transesophageal Echo, Cardiac Doppler and  Color Doppler (Both Spectral and Color Flow Doppler were utilized during procedure).  Indications:     Watchman Evaluation  History:         Patient has prior history of Echocardiogram examinations, most recent 12/13/2023. CAD; Risk Factors:Hypertension, Diabetes and Dyslipidemia. Watchman Device Implanted 12/13/23.  Sonographer:     Sherline Distel Senior RDCS Sonographer#2:   Kip Peon Referring Phys:  [id number] Diagnosing Phys: Jackquelyn Mass MD  PROCEDURE: After discussion of the risks and benefits of a TEE, an informed consent was obtained from the patient. TEE procedure time was 12 minutes. The transesophogeal probe was passed without difficulty through the esophogus of the patient. Imaged were obtained with the patient in a left lateral decubitus position. Sedation performed by different physician. The patient was monitored while under deep sedation. Anesthestetic sedation was provided intravenously by Anesthesiology: 470mg  of Propofol , 60mg  of Lidocaine . Image quality was excellent. The patient's vital signs; including heart rate, blood pressure, and oxygen saturation; remained stable throughout the procedure. The patient developed no complications during the procedure.  IMPRESSIONS   1. 27 mm Watchman FLX is LAA. No device leak or device-related thrombus. Left atrial size was severely dilated. No left atrial/left atrial appendage thrombus was detected. 2. Left ventricular ejection fraction, by estimation, is 60 to 65%. The left ventricle has normal function. The left ventricle has no regional wall motion abnormalities. 3. Right ventricular systolic function is normal. The right ventricular size is normal. 4. Right atrial size was mild to moderately dilated. 5. The mitral valve is grossly normal. Mild to moderate mitral valve regurgitation. No evidence of mitral stenosis. 6. The aortic valve is tricuspid. Aortic valve regurgitation is trivial. No aortic stenosis is present. 7.  There is mild (Grade II) protruding plaque involving the descending aorta and aortic arch. 8. 3D performed of the LAA and demonstrates 3D LAA assessment of Watchman device. Normal.  FINDINGS Left Ventricle: Left ventricular ejection fraction, by estimation, is 60 to 65%. The left ventricle has normal function. The left ventricle has no regional wall motion abnormalities. The left ventricular internal cavity size was normal in size.  Right Ventricle: The right ventricular size is normal. No increase in right ventricular wall thickness. Right ventricular systolic function is normal.  Left Atrium: 27 mm Watchman FLX is LAA. No device leak or device-related thrombus. Left atrial size was severely dilated. No left atrial/left atrial appendage thrombus was detected.  Right Atrium: Right atrial size was mild to moderately dilated.  Pericardium: There is no evidence of pericardial effusion.  Mitral Valve: The mitral valve is grossly normal. Mild to moderate mitral valve regurgitation. No evidence of mitral valve stenosis.  Tricuspid Valve: The tricuspid valve is grossly normal. Tricuspid valve regurgitation is mild . No evidence of tricuspid stenosis.  Aortic Valve: The aortic valve is tricuspid. Aortic valve regurgitation is trivial. No aortic stenosis is present.  Pulmonic Valve: The pulmonic valve was grossly normal. Pulmonic valve regurgitation is trivial. No evidence of pulmonic stenosis.  Aorta: The aortic root and ascending aorta are structurally normal, with no evidence of dilitation. There is mild (Grade II) protruding plaque involving the descending aorta and aortic arch.  Venous: The left upper pulmonary vein, left lower pulmonary vein, right lower pulmonary vein and right upper pulmonary vein are normal.  IAS/Shunts: The atrial septum is grossly normal.  Additional Comments: 3D was performed not requiring image post processing  on an independent workstation and was normal. Spectral  Doppler performed.   AORTA Ao Root diam: 3.32 cm Ao Asc diam:  3.85 cm  TRICUSPID VALVE TR Peak grad:   38.4 mmHg TR Vmax:        310.00 cm/s  Jackquelyn Mass MD Electronically signed by Jackquelyn Mass MD Signature Date/Time: 02/11/2024/12:13:50 PM    Final        ______________________________________________________________________________________________       Laboratory Data:  High Sensitivity Troponin:  No results for input(s): "TROPONINIHS" in the last 720 hours.   Chemistry Recent Labs  Lab 03/07/24 1754 03/08/24 0243 03/09/24 1113 03/10/24 0534 03/10/24 0535 03/11/24 0539  NA  --    < > 139  --  137 139  K  --    < > 4.3  --  4.2 4.4  CL  --    < > 109  --  109 110  CO2  --    < > 23  --  22 24  GLUCOSE  --    < > 141*  --  147* 141*  BUN  --    < > 43*  --  43* 35*  CREATININE  --    < > 2.32*  --  2.14* 2.07*  CALCIUM   --    < > 8.0*  --  8.0* 8.3*  MG 1.9  --   --  2.0  --   --   GFRNONAA  --    < > 28*  --  31* 32*  ANIONGAP  --    < > 7  --  6 5   < > = values in this interval not displayed.    No results for input(s): "PROT", "ALBUMIN ", "AST", "ALT", "ALKPHOS", "BILITOT" in the last 168 hours. Lipids No results for input(s): "CHOL", "TRIG", "HDL", "LABVLDL", "LDLCALC", "CHOLHDL" in the last 168 hours.  Hematology Recent Labs  Lab 03/08/24 0737 03/08/24 1616 03/09/24 1113 03/10/24 0535 03/11/24 0539  WBC 20.6*  --  13.6* 11.8*  --   RBC 2.91*  --  3.07* 3.00*  --   HGB 8.0*   < > 8.5* 8.3* 8.9*  HCT 25.4*   < > 26.8* 26.6* 28.9*  MCV 87.3  --  87.3 88.7  --   MCH 27.5  --  27.7 27.7  --   MCHC 31.5  --  31.7 31.2  --   RDW 16.5*  --  16.7* 16.9*  --   PLT 152  --  126* 147*  --    < > = values in this interval not displayed.   Thyroid  No results for input(s): "TSH", "FREET4" in the last 168 hours.  BNPNo results for input(s): "BNP", "PROBNP" in the last 168 hours.  DDimer No results for input(s): "DDIMER" in the last 168  hours.   Radiology/Studies:  IR NEPHROSTOMY EXCHANGE LEFT Result Date: 03/10/2024 INDICATION: 78 year old with hemorrhagic cystitis. Left percutaneous nephrostomy tube was recently placed on 03/07/2024. Urology requested a nephrostogram to evaluate the renal collecting system and look for any obstruction. 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 EXAM: 1. LEFT NEPHROSTOGRAM THROUGH EXISTING ACCESS 2. EXCHANGE OF LEFT NEPHROSTOMY TUBE WITH FLUOROSCOPY Physician: Olive Better. Julietta Ogren, MD COMPARISON:  None Available. MEDICATIONS: 1% lidocaine  ANESTHESIA/SEDATION: None CONTRAST:  15mL OMNIPAQUE  IOHEXOL  300 MG/ML SOLN - administered into the collecting system(s) FLUOROSCOPY: Radiation Exposure Index (as provided by the fluoroscopic device): 76 mGy Kerma COMPLICATIONS: None immediate. PROCEDURE:  Patient was placed prone on the interventional table. Scout image was obtained. Left nephrostogram was performed through the nephrostomy tube. The nephrostomy tube was partially dislodged. As a result, we proceeded with a left nephrostomy tube exchange. The procedure was explained to the patient. The risks and benefits of the procedure were discussed and the patient's questions were addressed. Informed consent was obtained from the patient. The existing catheter and surrounding skin were prepped and draped in sterile fashion. Maximal barrier sterile technique was utilized including caps, mask, sterile gowns, sterile gloves, sterile drape, hand hygiene and skin antiseptic. Contrast was injected through the nephrostomy tube. Nephrostomy tube was cut and removed over a wire. New 10 French multipurpose drain was advanced over the wire and reconstituted in the renal pelvis. Contrast injection confirmed placement in the renal pelvis. Skin was anesthetized with 1% lidocaine . Nephrostomy tube was sutured to skin. Nephrostomy tube was flushed with saline and capped. Fluoroscopic images were taken  and saved for this procedure. FINDINGS: Left nephrostomy tube was positioned in a lower pole calyx and the tube was partially dislodged with some contrast leaking along the drain tract. Mild dilatation the left renal collecting system and left ureter. Left ureter was tortuous. Small segment of the distal ureter was difficult to opacify but no suspicious filling defects in the left renal collecting system. Contrast was spontaneously draining into the urinary bladder. Left nephrostomy tube was exchanged and the new nephrostomy tube was positioned in the left renal pelvis. IMPRESSION: 1. Left ureter is patent with drainage into the urinary bladder. 2. Mild dilatation of the left ureter without suspicious filling defects or gross abnormalities. 3. Left nephrostomy tube exchanged because the tube was partially dislodged. Electronically Signed   By: Elene Griffes M.D.   On: 03/10/2024 15:11   IR NEPHROSTOMY PLACEMENT LEFT Result Date: 03/07/2024 INDICATION: 78 year old male with complex medical history. In short, he has had prior right nephro ureterectomy and now has severe hemorrhagic cystitis of unclear etiology. Due to persistent blood loss he presents for urinary diversion on the left. EXAM: IR NEPHROSTOMY PLACEMENT LEFT COMPARISON:  None Available. MEDICATIONS: 2 g Rocephin ; The antibiotic was administered in an appropriate time frame prior to skin puncture. ANESTHESIA/SEDATION: 1 mg Versed  administered intravenously for anxiolysis. CONTRAST:  20mL OMNIPAQUE  IOHEXOL  300 MG/ML SOLN - administered into the collecting system(s) FLUOROSCOPY: Radiation exposure index: 15 mGy COMPLICATIONS: None immediate. TECHNIQUE: The procedure, risks, benefits, and alternatives were explained to the patient. Questions regarding the procedure were encouraged and answered. The patient understands and consents to the procedure. The left flank was prepped with chlorhexidine  in a sterile fashion, and a sterile drape was applied covering the  operative field. A sterile gown and sterile gloves were used for the procedure. Local anesthesia was provided with 1% Lidocaine . The left flank was interrogated with ultrasound and the left kidney identified. The kidney is hydronephrotic. A suitable access site on the skin overlying the lower pole, posterior calix was identified. After local mg anesthesia was achieved, a small skin nick was made with an 11 blade scalpel. A 21 gauge Accustick needle was then advanced under direct sonographic guidance into the lower pole of the left kidney. A 0.018 inch wire was advanced under fluoroscopic guidance into the left renal collecting system. The Accustick sheath was then advanced over the wire and a 0.018 system exchanged for a 0.035 system. Gentle hand injection of contrast material confirms placement of the sheath within the renal collecting system. There is mild hydronephrosis and a  tortuous ureter. The tract from the scan into the renal collecting system was then dilated serially to 10-French. A 10-French Cook all-purpose drain was then placed and positioned under fluoroscopic guidance. The locking loop is well formed within the left renal pelvis. The catheter was secured to the skin with 2-0 Prolene and a sterile bandage was placed. Catheter was left to gravity bag drainage. IMPRESSION: Successful placement of a left 10 French percutaneous nephrostomy tube. Electronically Signed   By: Fernando Hoyer M.D.   On: 03/07/2024 14:59     Assessment and Plan:   Permanent atrial fibrillation - Patient has permanent atrial fibrillation, s/p Watchman procedure in 11/2023 - Earlier this admission, after he had bleeding after his bladder biopsies, patient did have episodes of tachycardia.  He was restarted on metoprolol , given blood transfusions.  Heart rate has stabilized and is now predominantly in the 60s-70s per telemetry.  Heart rate did increase to around 110s when working with physical therapy this a.m. - On  telemetry, there are occasional brief runs of wide-complex beats.  During these runs, heart rhythm is irregular.  Suspect atrial fibrillation with aberrancy - Continue metoprolol  tartrate 25 mg twice daily - Continue Plavix  through July 2025 following Watchman procedure- this is currently held due to bleeding. Resume when OK with urology   CAD  - Patient denies chest pain  - Continue metoprolol , crestor   - As above, resume plavix  when OK with urology   Otherwise per primary - Hemorrhagic cystitis, gross hematuria - Acute blood loss anemia - AKI on CKD - Bladder cancer -Type 2 diabetes   Risk Assessment/Risk Scores:    CHA2DS2-VASc Score = 6   This indicates a 9.7% annual risk of stroke. The patient's score is based upon: CHF History: 1 HTN History: 1 Diabetes History: 1 Stroke History: 0 Vascular Disease History: 1 Age Score: 2 Gender Score: 0   For questions or updates, please contact [facility] Please consult [email or web address] for contact info under    Signed, Debria Fang, PA-C  03/11/2024 11:24 AM

## 2024-03-11 NOTE — Progress Notes (Signed)
 Patient ID: Adam Harris, male   DOB: 1946/04/09, 78 y.o.   MRN: [id number]  5 Days Post-Op Subjective: Left nephrostogram looked good yesterday without obstruction or filling defects to suggest upper tract malignancy. Pt felt like he had a full bladder yesterday after nephrostomy tube clamped.  Bladder scan apparently with only about 250 cc according to the patient.  Left nephrostomy tube placed back to drainage with relief.  Complained of right foot pain when ambulating.  Now improved.    Objective: Vital signs in last 24 hours: Temp:  [98.6 F (37 C)] 98.6 F (37 C) (04/28 2122) Pulse Rate:  [69-85] 69 (04/29 0617) Resp:  [20] 20 (04/29 0617) BP: (120-142)/(70-90) 142/90 (04/29 0617) SpO2:  [90 %-92 %] 92 % (04/29 0617)  Intake/Output from previous day: 04/28 0701 - 04/29 0700 In: 240 [P.O.:240] Out: 875 [Urine:875] Intake/Output this shift: No intake/output data recorded.  Physical Exam:  General: Alert and oriented CV: Irregular, normal rate Lungs: Clear, normal respiratory effort Abdomen: Soft, ND GU: Left PCN with clear urine drainage Ext: NT, No erythema  Lab Results: Recent Labs    03/09/24 1113 03/10/24 0535 03/11/24 0539  HGB 8.5* 8.3* 8.9*  HCT 26.8* 26.6* 28.9*   BMET Recent Labs    03/10/24 0535 03/11/24 0539  NA 137 139  K 4.2 4.4  CL 109 110  CO2 22 24  GLUCOSE 147* 141*  BUN 43* 35*  CREATININE 2.14* 2.07*  CALCIUM  8.0* 8.3*     Studies/Results: IR NEPHROSTOMY EXCHANGE LEFT Result Date: 03/10/2024 INDICATION: 78 year old with hemorrhagic cystitis. Left percutaneous nephrostomy tube was recently placed on 03/07/2024. Urology requested a nephrostogram to evaluate the renal collecting system and look for any obstruction. 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 EXAM: 1. LEFT NEPHROSTOGRAM THROUGH EXISTING ACCESS 2. EXCHANGE OF LEFT NEPHROSTOMY TUBE WITH FLUOROSCOPY Physician: Olive Better. Julietta Ogren, MD COMPARISON:   None Available. MEDICATIONS: 1% lidocaine  ANESTHESIA/SEDATION: None CONTRAST:  15mL OMNIPAQUE  IOHEXOL  300 MG/ML SOLN - administered into the collecting system(s) FLUOROSCOPY: Radiation Exposure Index (as provided by the fluoroscopic device): 76 mGy Kerma COMPLICATIONS: None immediate. PROCEDURE: Patient was placed prone on the interventional table. Scout image was obtained. Left nephrostogram was performed through the nephrostomy tube. The nephrostomy tube was partially dislodged. As a result, we proceeded with a left nephrostomy tube exchange. The procedure was explained to the patient. The risks and benefits of the procedure were discussed and the patient's questions were addressed. Informed consent was obtained from the patient. The existing catheter and surrounding skin were prepped and draped in sterile fashion. Maximal barrier sterile technique was utilized including caps, mask, sterile gowns, sterile gloves, sterile drape, hand hygiene and skin antiseptic. Contrast was injected through the nephrostomy tube. Nephrostomy tube was cut and removed over a wire. New 10 French multipurpose drain was advanced over the wire and reconstituted in the renal pelvis. Contrast injection confirmed placement in the renal pelvis. Skin was anesthetized with 1% lidocaine . Nephrostomy tube was sutured to skin. Nephrostomy tube was flushed with saline and capped. Fluoroscopic images were taken and saved for this procedure. FINDINGS: Left nephrostomy tube was positioned in a lower pole calyx and the tube was partially dislodged with some contrast leaking along the drain tract. Mild dilatation the left renal collecting system and left ureter. Left ureter was tortuous. Small segment of the distal ureter was difficult to opacify but no suspicious filling defects in the left renal collecting system. Contrast was spontaneously draining into  the urinary bladder. Left nephrostomy tube was exchanged and the new nephrostomy tube was  positioned in the left renal pelvis. IMPRESSION: 1. Left ureter is patent with drainage into the urinary bladder. 2. Mild dilatation of the left ureter without suspicious filling defects or gross abnormalities. 3. Left nephrostomy tube exchanged because the tube was partially dislodged. Electronically Signed   By: Elene Griffes M.D.   On: 03/10/2024 15:11    Assessment/Plan: POD # 5 s/p cystoscopy, urethral dilation, bladder biopsies and fulguration   1) Hemorrhagic cystitis: Acute bleeding now stopped.  Hgb stable.  Will restart ASA 81 mg today.  Hold Plavix .   2) CKD with AKI: Cr improving now to 2.0.  Will cap nephrostomy tube again this morning.   3) CAD/atrial fibrillation:  Rate controlled now.  Appreciate help from medicine.   4) Urinary retention:  Will cap nephrostomy tube and give trial of void again with plans to check bladder scan post void.  5) Bladder cancer:  Biopsies were negative for malignancy which still raises doubt about true recurrence. Discussed with patient and his wife yesterday.  Will arrange outpatient follow up to discuss options of cystectomy vs continuing Keytruda .  6) Disposition:  Needs to improve ambulation.  Will order PT to evaluate and treat.  Right foot pain is better.  Do not suspect DVT.  Continue SCDs.   LOS: 4 days   Kristeen Peto 03/11/2024, 7:29 AM

## 2024-03-12 ENCOUNTER — Inpatient Hospital Stay

## 2024-03-12 ENCOUNTER — Inpatient Hospital Stay: Admitting: Hematology

## 2024-03-12 DIAGNOSIS — C679 Malignant neoplasm of bladder, unspecified: Secondary | ICD-10-CM | POA: Diagnosis not present

## 2024-03-12 LAB — GLUCOSE, CAPILLARY
Glucose-Capillary: 126 mg/dL — ABNORMAL HIGH (ref 70–99)
Glucose-Capillary: 153 mg/dL — ABNORMAL HIGH (ref 70–99)
Glucose-Capillary: 165 mg/dL — ABNORMAL HIGH (ref 70–99)
Glucose-Capillary: 185 mg/dL — ABNORMAL HIGH (ref 70–99)

## 2024-03-12 LAB — BASIC METABOLIC PANEL WITH GFR
Anion gap: 9 (ref 5–15)
Anion gap: 4 — ABNORMAL LOW (ref 5–15)
BUN: 36 mg/dL — ABNORMAL HIGH (ref 8–23)
BUN: 40 mg/dL — ABNORMAL HIGH (ref 8–23)
CO2: 21 mmol/L — ABNORMAL LOW (ref 22–32)
CO2: 23 mmol/L (ref 22–32)
Calcium: 8.3 mg/dL — ABNORMAL LOW (ref 8.9–10.3)
Calcium: 8.4 mg/dL — ABNORMAL LOW (ref 8.9–10.3)
Chloride: 106 mmol/L (ref 98–111)
Chloride: 108 mmol/L (ref 98–111)
Creatinine, Ser: 2.43 mg/dL — ABNORMAL HIGH (ref 0.61–1.24)
Creatinine, Ser: 2.75 mg/dL — ABNORMAL HIGH (ref 0.61–1.24)
GFR, Estimated: 27 mL/min — ABNORMAL LOW (ref >60)
GFR, Estimated: 23 mL/min — ABNORMAL LOW (ref >60)
Glucose, Bld: 150 mg/dL — ABNORMAL HIGH (ref 70–99)
Glucose, Bld: 173 mg/dL — ABNORMAL HIGH (ref 70–99)
Potassium: 4.5 mmol/L (ref 3.5–5.1)
Potassium: 4.5 mmol/L (ref 3.5–5.1)
Sodium: 136 mmol/L (ref 135–145)
Sodium: 135 mmol/L (ref 135–145)

## 2024-03-12 MED ORDER — SENNOSIDES-DOCUSATE SODIUM 8.6-50 MG PO TABS
1.0000 | ORAL_TABLET | Freq: Once | ORAL | Status: AC
  Administered 2024-03-12: 1 via ORAL
  Filled 2024-03-12: qty 1

## 2024-03-12 MED ORDER — POLYETHYLENE GLYCOL 3350 17 G PO PACK
17.0000 g | PACK | Freq: Every day | ORAL | Status: DC
  Administered 2024-03-12 – 2024-03-13 (×2): 17 g via ORAL
  Filled 2024-03-12 (×2): qty 1

## 2024-03-12 MED ORDER — SODIUM CHLORIDE 0.9 % IV SOLN: INTRAVENOUS | Status: DC

## 2024-03-12 NOTE — Progress Notes (Signed)
 [facility] Liaison Note  03/12/2024  Adam Harris 1946/05/05 [id number]  Location: RN Hospital Liaison screened the patient remotely at [facility].  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 The patient was screened for  readmission hospitalization with noted high risk score for unplanned readmission risk with 2 IP/1 ED in 6 months.  The patient was assessed for potential Care Management service needs for post hospital transition for care coordination. 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 Pt follow heavily by the oncology team.   Plan: [facility] Liaison will continue to follow progress and disposition to asess for post hospital community care coordination/management needs.  Referral request for community care coordination: pending disposition.   VBCI Care Management/Population Health does not replace or interfere with any arrangements made by the Inpatient Transition of Care team.   For questions contact:   Lilla Reichert, RN, BSN Hospital Liaison    [facility], Population Health Office Hours MTWF  8:00 am-6:00 pm Direct Dial: [phone] mobile [email or web address]

## 2024-03-12 NOTE — Progress Notes (Signed)
 PROGRESS NOTE  Adam Harris  DOB: [birth year]  PCP: Graig Lawyer, MD MVH:[id number]  DOA: 03/06/2024  LOS: 5 days  Hospital Day: 7  Brief narrative: Adam Harris is a 78 y.o. male with PMH significant for obesity, DM2, HTN, HLD, CAD, CHF, permanent A-fib status post Watchman procedure, not on Eliquis  lately, CKD, bladder cancer.  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  03/06/2024, patient underwent surveillance cystoscopy and transurethral bladder biopsies.  Postop, patient had significant bleeding with clotting of his catheter.  He returned to the OR and underwent fulguration.  He had rapid drop in hemoglobin and blood pressure.  Nephrostomy tube was placed by IR.  03/06/2024 underwent surveillance cystoscopy and transurethral bladder biopsies and postop had significant bleeding with clotting of his catheter.  He returned to the OR and underwent fulguration.   He was monitored in the hospital. Overnight, he had a significant drop in his hemoglobin from 13-8.7 with soft BPs.   4/25, A nephrostomy tube was placed by IR.  Postprocedure, patient developed tachyarrhythmia.  PCCM was consulted, blood pressure was resumed. 4/27, [facility] was consulted for management of medical issues 4/29, cardiology was consulted.  Subjective: Patient was seen and examined this afternoon.  Pleasant elderly Caucasian male.  Sitting up in recliner.  Not in distress. Chart reviewed.  Hemodynamically stable. Left nephrostomy tube remains clamped. On 2 L oxygen by nasal cannula Labs from this morning shows creatinine elevated to 2.43  Assessment and plan: Hemorrhagic cystitis Secondary to bladder cancer.  Worsened after bladder biopsy on 4/24.  Required fulguration, nephrostomy tube By IR. Currently has clear urine. Nephrostomy tube remains  clamped. Currently on aspirin  81 mg daily.  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  Cancer refractory to BCG.  Patient is currently being treated with IV pembrolizumab . Per urology note, biopsies were negative for malignancy.  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  Noted to rise again in the last 24 hours. Noted a plan from urology to determine if nephrostomy needs to be put back to drainage. For now, I will start him on NS at 75 mL/h for next 24 hours. Recent Labs    01/10/24 1315 01/31/24 1237 02/21/24 1255 03/07/24 0306 03/07/24 1526 03/08/24 0243 03/09/24 1113 03/10/24 0535 03/11/24 0539 03/12/24 0553  BUN 39* 35* 42* 37* 50* 57* 43* 43* 35* 36*  CREATININE 2.08* 1.79* 1.88* 2.32* 2.62* 2.85* 2.32* 2.14* 2.07* 2.43*   Acute blood loss anemia Secondary to hematuria from hemorrhagic cystitis. Patient received a total of 3 units of PRBC. Recent Labs    03/08/24 0737 03/08/24 1616 03/09/24 1113 03/10/24 0535 03/11/24 0539  HGB 8.0* 8.9* 8.5* 8.3* 8.9*  MCV 87.3  --  87.3 88.7  --   VITAMINB12  --   --   --  808  --   FOLATE  --   --   --  8.2  --   FERRITIN  --   --   --  58  --   TIBC  --   --   --  263  --   IRON  --   --   --  21*  --  Tachyarrhythmia with wide complex Paroxysmal atrial fibrillation s/p watchman Known right bundle branch block.  Tachycardia presumed secondary to acute anemia and beta-blocker withdrawal.  Patient is off anticoagulation since Watchman procedure.  Was planned for Plavix  to continue through July 2025. Currently on hold due to hematuria.  Plan to resume once okay with urology.  Aspirin  81 mg daily continued. Continue telemetry Continue metoprolol  BID  Leukocytosis Likely reactive. No evidence of infection. Recent Labs  Lab  03/08/24 0737 03/09/24 1113 03/10/24 0535  WBC 20.6* 13.6* 11.8*   Thrombocytopenia Mild. Likely secondary to acute blood loss. Improving. Recent Labs  Lab 03/08/24 0737 03/09/24 1113 03/10/24 0535  PLT 152 126* 147*   Diabetes mellitus, type 2 Well controlled with hemoglobin A1C of 6.8%. Continue SSI   Essential hypertension Continue metoprolol    CAD Continue metoprolol , aspirin , Crestor  Plavix  plan as above     Right foot pain Unclear etiology. No known injury. Not consistent with arthropathy. Right foot x-ray negative. Pain improving     Mobility: Encourage ambulation.  Independently ambulating  Goals of care   Code Status: Full Code     DVT prophylaxis:  Place and maintain sequential compression device Start: 03/10/24 0807 Place and maintain sequential compression device Start: 03/07/24 1732 SCDs Start: 03/06/24 2232   Antimicrobials: Not on antibiotics currently Fluid: Start NS at 75 mL/h Family Communication: None at bedside  Status: Inpatient Level of care:  Telemetry   Patient is from: Home Needs to continue in-hospital care: Needs IV hydration,  Anticipated d/c to: urology to decide      Diet:  Diet Order             Diet Carb Modified Fluid consistency: Thin; Room service appropriate? Yes  Diet effective now                   Scheduled Meds:  aspirin  EC  81 mg Oral Daily   Chlorhexidine  Gluconate Cloth  6 each Topical Daily   cholecalciferol  1,000 Units Oral Daily   docusate sodium   100 mg Oral BID   insulin  aspart  0-5 Units Subcutaneous QHS   insulin  aspart  0-9 Units Subcutaneous TID WC   metoprolol  tartrate  25 mg Oral BID   mupirocin  ointment  1 Application Nasal BID   rosuvastatin   40 mg Oral Daily   sodium chloride  flush  3-10 mL Intravenous Q12H   tamsulosin   0.4 mg Oral Daily    PRN meds: diphenhydrAMINE  **OR** diphenhydrAMINE , HYDROcodone -acetaminophen , hyoscyamine , nitroGLYCERIN , ondansetron , mouth rinse,  traZODone    Infusions:    Antimicrobials: Anti-infectives (From admission, onward)    Start     Dose/Rate Route Frequency Ordered Stop   03/07/24 1115  cefTRIAXone  (ROCEPHIN ) 2 g in sodium chloride  0.9 % 100 mL IVPB       Note to Pharmacy: Administer in IR   2 g 200 mL/hr over 30 Minutes Intravenous  Once 03/07/24 1024 03/07/24 1230   03/06/24 2200  ciprofloxacin  (CIPRO ) IVPB 400 mg        400 mg 200 mL/hr over 60 Minutes Intravenous Every 12 hours 03/06/24 2036 03/07/24 1146   03/06/24 1125  ceFAZolin  (ANCEF ) IVPB 2g/100 mL premix        2 g 200 mL/hr over 30 Minutes Intravenous 30 min pre-op 03/06/24 1125 03/06/24 1227       Objective: Vitals:   03/11/24 2229 03/12/24 0629  BP: (!) 165/84 (!) 140/76  Pulse: 78 77  Resp:  20  Temp:  98.2 F (36.8 C) 98.2 F (36.8 C)  SpO2: 92% (!) 88%    Intake/Output Summary (Last 24 hours) at 03/12/2024 1022 Last data filed at 03/12/2024 0700 Gross per 24 hour  Intake 243 ml  Output 595 ml  Net -352 ml   Filed Weights   03/06/24 1146 03/07/24 0002 03/09/24 1700  Weight: 95 kg 98.7 kg 102.5 kg   Weight change:  Body mass index is 31.51 kg/m.   Physical Exam: General exam: Pleasant, elderly Caucasian male. Skin: No rashes, lesions or ulcers. HEENT: Atraumatic, normocephalic, no obvious bleeding Lungs: Clear to auscultation bilaterally,  CVS: S1, S2, no murmur,   GI/Abd: Soft, nontender, nondistended, bowel sound present, left nephrostomy tube remains clamped CNS: Alert, awake, oriented x 3 Psychiatry: Mood appropriate,  Extremities: No pedal edema, no calf tenderness,   Data Review: I have personally reviewed the laboratory data and studies available.  F/u labs ordered Unresulted Labs (From admission, onward)     Start     Ordered   03/13/24 0500  Basic metabolic panel with GFR  Tomorrow morning,   R       Question:  Specimen collection method  Answer:  Lab=Lab collect   03/12/24 8295   03/12/24 1400  Basic  metabolic panel  Once,   R       Question:  Specimen collection method  Answer:  Lab=Lab collect   03/12/24 6213            Signed, Hoyt Macleod, MD [facility] 03/12/2024

## 2024-03-12 NOTE — Plan of Care (Signed)
  Problem: Education: Goal: Knowledge of General Education information will improve Description: Including pain rating scale, medication(s)/side effects and non-pharmacologic comfort measures Outcome: Progressing   Problem: Health Behavior/Discharge Planning: Goal: Ability to manage health-related needs will improve Outcome: Progressing   Problem: Clinical Measurements: Goal: Ability to maintain clinical measurements within normal limits will improve Outcome: Progressing Goal: Will remain free from infection Outcome: Progressing Goal: Diagnostic test results will improve Outcome: Progressing Goal: Respiratory complications will improve Outcome: Progressing Goal: Cardiovascular complication will be avoided Outcome: Progressing   Problem: Activity: Goal: Risk for activity intolerance will decrease Outcome: Progressing   Problem: Nutrition: Goal: Adequate nutrition will be maintained Outcome: Progressing   Problem: Coping: Goal: Level of anxiety will decrease Outcome: Progressing   Problem: Elimination: Goal: Will not experience complications related to bowel motility Outcome: Progressing Goal: Will not experience complications related to urinary retention Outcome: Progressing   Problem: Pain Managment: Goal: General experience of comfort will improve and/or be controlled Outcome: Progressing   Problem: Safety: Goal: Ability to remain free from injury will improve Outcome: Progressing   Problem: Skin Integrity: Goal: Risk for impaired skin integrity will decrease Outcome: Progressing   Problem: Education: Goal: Knowledge of the prescribed therapeutic regimen will improve Outcome: Progressing   Problem: Bowel/Gastric: Goal: Gastrointestinal status for postoperative course will improve Outcome: Progressing   Problem: Health Behavior/Discharge Planning: Goal: Identification of resources available to assist in meeting health care needs will improve Outcome:  Progressing   Problem: Skin Integrity: Goal: Demonstration of wound healing without infection will improve Outcome: Progressing   Problem: Urinary Elimination: Goal: Ability to avoid or minimize complications of infection will improve Outcome: Progressing   Problem: Education: Goal: Ability to describe self-care measures that may prevent or decrease complications (Diabetes Survival Skills Education) will improve Outcome: Progressing Goal: Individualized Educational Video(s) Outcome: Progressing   Problem: Coping: Goal: Ability to adjust to condition or change in health will improve Outcome: Progressing   Problem: Fluid Volume: Goal: Ability to maintain a balanced intake and output will improve Outcome: Progressing   Problem: Health Behavior/Discharge Planning: Goal: Ability to identify and utilize available resources and services will improve Outcome: Progressing Goal: Ability to manage health-related needs will improve Outcome: Progressing   Problem: Metabolic: Goal: Ability to maintain appropriate glucose levels will improve Outcome: Progressing   Problem: Nutritional: Goal: Maintenance of adequate nutrition will improve Outcome: Progressing Goal: Progress toward achieving an optimal weight will improve Outcome: Progressing   Problem: Skin Integrity: Goal: Risk for impaired skin integrity will decrease Outcome: Progressing   Problem: Tissue Perfusion: Goal: Adequacy of tissue perfusion will improve Outcome: Progressing

## 2024-03-12 NOTE — Progress Notes (Signed)
 Patient ID: Adam Harris, male   DOB: [birth year], 78 y.o.   MRN: [id number]   6 Days Post-Op Subjective: Pt doing ok.  Denies flank or abdominal pain.  Last voids have been 200-300 cc and mostly clear.  Nephrostomy tube remains clamped.  Right foot pain better.  Objective: Vital signs in last 24 hours: Temp:  [98.2 F (36.8 C)-98.6 F (37 C)] 98.2 F (36.8 C) (04/30 0629) Pulse Rate:  [75-78] 77 (04/30 0629) Resp:  [17-20] 20 (04/30 0629) BP: (115-165)/(76-84) 140/76 (04/30 0629) SpO2:  [88 %-92 %] 88 % (04/30 0629)  Intake/Output from previous day: 04/29 0701 - 04/30 0700 In: 483 [P.O.:480; I.V.:3] Out: 845 [Urine:845] Intake/Output this shift: No intake/output data recorded.  Physical Exam:  General: Alert and oriented Abdomen: Soft, ND, no CVAT Ext: NT, No erythema  Lab Results: Recent Labs    03/09/24 1113 03/10/24 0535 03/11/24 0539  HGB 8.5* 8.3* 8.9*  HCT 26.8* 26.6* 28.9*   BMET Recent Labs    03/11/24 0539 03/12/24 0553  NA 139 136  K 4.4 4.5  CL 110 106  CO2 24 21*  GLUCOSE 141* 150*  BUN 35* 36*  CREATININE 2.07* 2.43*  CALCIUM  8.3* 8.3*     Studies/Results:   Assessment/Plan: POD # 6 s/p cystoscopy, urethral dilation, bladder biopsies and fulguration   1) Hemorrhagic cystitis: Acute bleeding now stopped.  Hgb stable.  Continue ASA 81 mg.  Hold Plavix .   2) CKD with AKI: Cr up to 2.4 from 2.0 after nephrostomy tube capped yesterday.  Will check bladder PVR and determine if nephrostomy needs to be put back to drainage or not.  He was not obstructed on his nephrostogram 4/28.   3) CAD/atrial fibrillation:  Rate controlled now.  Appreciate help from medicine/cardiology.   4) Bladder cancer:  Biopsies were negative for malignancy which still raises doubt about true recurrence. Discussed with patient and his wife yesterday.  Will arrange outpatient follow up to discuss options of cystectomy vs continuing Keytruda .  Scheduled for outpatient  consultation with me on 5/13.   6) Disposition:  Will address his worsening renal function and if resolved hope for discharge later today or tomorrow.   LOS: 5 days   Kristeen Peto 03/12/2024, 8:05 AM

## 2024-03-12 NOTE — Progress Notes (Signed)
 Mobility Specialist - Progress Note   03/12/24 0951  Mobility  Activity Ambulated with assistance to bathroom  Level of Assistance Standby assist, set-up cues, supervision of patient - no hands on  Assistive Device Front wheel walker  Distance Ambulated (ft) 20 ft  Activity Response Tolerated well  Mobility Referral Yes  Mobility visit 1 Mobility  Mobility Specialist Start Time (ACUTE ONLY) 0945  Mobility Specialist Stop Time (ACUTE ONLY) 0951  Mobility Specialist Time Calculation (min) (ACUTE ONLY) 6 min   Pt received in bed and agreeable to transfer to the recliner. Once standing, pt requested assistance to the bathroom for BM. Instructed pt to pull call bell when finished. NT made aware. No complaints during session. Pt to bathroom after session with all needs met.    [facility]

## 2024-03-13 LAB — BASIC METABOLIC PANEL WITH GFR
Anion gap: 9 (ref 5–15)
BUN: 40 mg/dL — ABNORMAL HIGH (ref 8–23)
CO2: 19 mmol/L — ABNORMAL LOW (ref 22–32)
Calcium: 8.1 mg/dL — ABNORMAL LOW (ref 8.9–10.3)
Chloride: 107 mmol/L (ref 98–111)
Creatinine, Ser: 2.22 mg/dL — ABNORMAL HIGH (ref 0.61–1.24)
GFR, Estimated: 30 mL/min — ABNORMAL LOW (ref >60)
Glucose, Bld: 141 mg/dL — ABNORMAL HIGH (ref 70–99)
Potassium: 4 mmol/L (ref 3.5–5.1)
Sodium: 135 mmol/L (ref 135–145)

## 2024-03-13 LAB — GLUCOSE, CAPILLARY: Glucose-Capillary: 139 mg/dL — ABNORMAL HIGH (ref 70–99)

## 2024-03-13 MED ORDER — ASPIRIN 81 MG PO TBEC: 81.0000 mg | DELAYED_RELEASE_TABLET | Freq: Every day | ORAL | 12 refills | Status: DC

## 2024-03-13 NOTE — Discharge Summary (Signed)
 Date of admission: 03/06/2024  Date of discharge: 03/13/2024  Admission diagnosis: Bladder cancer  Discharge diagnosis: Bladder cancer, hemorrhagic cystitis, acute kidney injury  Secondary diagnoses: Diabetes, hypertension, atrial fibrillation  History and Physical: For full details, please see admission history and physical. Briefly, Adam Harris is a 78 y.o. year old patient with a history of high-grade urothelial carcinoma status post right nephroureterectomy in the past.  He subsequently developed high-grade urothelial carcinoma the bladder that has been managed initially with intravesical BCG and more recently with IV pembrolizumab .  He recently was noted to have a suspicious cytology prompting recommendations to proceed with cystoscopy and bladder biopsy for further diagnostic evaluation.  Hospital Course: He was taken the operating room on 03/06/2024 and underwent cystoscopy and bladder biopsies.  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  It was difficult to place a 26 French resectoscope and a 24 French resectoscope was initially not available at the hospital.  Visualization was quite poor on entry into the bladder.  Multiple biopsies were obtained with the bipolar loop.  Hemostasis was then performed with bipolar cautery.  The catheter was left in place as the urine was still somewhat bloody.  The initial plan was to discharge him home from the recovery room.  However, he had significant hematuria requiring his catheter to be exchanged for a hematuria catheter.  He was placed on continuous bladder irrigation but his urine remained quite bloody resulting in him being taken back to the operating room that same evening for cystoscopy and fulguration.  A 24 French resectoscope was able to be brought over from an alternate hospital.  There appeared to be very diffuse and hemorrhagic bleeding from the bladder and he still required extensive continuous bladder  irrigation overnight.  His hemoglobin dropped from 13.7 down to 8.7.  He received 3 units of blood during his hospitalization and had a left nephrostomy tube placed to divert his urine.  Subsequent nephrostogram demonstrated no filling defects in the renal collecting system or ureter.  He also did not appear to be obstructed and his nephrostomy tube was clamped.  His creatinine had been increasing prior to nephrostomy placement.  This subsequently improved back to baseline but then increased again after his nephrostomy tube was clamped.  He was not voiding well and ultimately was felt that he would be best discharged with his left nephrostomy tube to drainage.  During his hospitalization, he did develop a tachyarrhythmia and cardiology was consulted.  He was able to be managed medically and his rate was controlled.  He was able to be discharged home on 03/13/2024.  His hemoglobin was stable and his renal function was improved.  He was able to be started back on aspirin  81 mg but his Plavix  will be held until his follow-up office visit.  Laboratory values:  Recent Labs    03/11/24 0539  HGB 8.9*  HCT 28.9*   Recent Labs    03/12/24 1313 03/13/24 0637  CREATININE 2.75* 2.22*    Disposition: Home  Discharge instruction: The patient was instructed to be ambulatory but told to refrain from heavy lifting, strenuous activity, or driving.   Discharge medications:  Allergies as of 03/13/2024       Reactions   Xarelto  [rivaroxaban ] Other (See Comments)   Skin Blisters         Medication List     STOP taking these medications    clopidogrel  75 MG tablet Commonly known as: PLAVIX   nitrofurantoin  (macrocrystal-monohydrate) 100 MG capsule Commonly known as: Macrobid    oxybutynin  15 MG 24 hr tablet Commonly known as: DITROPAN  XL   phenazopyridine  200 MG tablet Commonly known as: Pyridium        TAKE these medications    amLODipine  5 MG tablet Commonly known as: NORVASC  TAKE 1  TABLET (5 MG TOTAL) BY MOUTH DAILY   aspirin  EC 81 MG tablet Take 1 tablet (81 mg total) by mouth daily. Swallow whole.   furosemide  20 MG tablet Commonly known as: LASIX  Take 1 tablet (20 mg total) by mouth daily. For the next 3 days take 40 mg (2 tabs) daily then resume one tab daily.   hydrOXYzine  25 MG capsule Commonly known as: VISTARIL  Take 1 capsule (25 mg total) by mouth every 8 (eight) hours as needed.   isosorbide  mononitrate 30 MG 24 hr tablet Commonly known as: IMDUR  TAKE ONE TABLET BY MOUTH DAILY   Januvia  25 MG tablet Generic drug: sitaGLIPtin  TAKE ONE TABLET BY MOUTH DAILY   KEYTRUDA  IV Inject into the vein every 14 (fourteen) days.   metoprolol  tartrate 25 MG tablet Commonly known as: LOPRESSOR  TAKE ONE TABLET BY MOUTH TWICE A DAY   nitroGLYCERIN  0.4 MG SL tablet Commonly known as: NITROSTAT  TAKE ONE TABLET UNDER THE TONGUE EVERY FIVE MINUTES FOR THREE DOSES AS NEEDED FOR CHEST PAIN, CALL 911 IF 2ND DOSE DOESN'T HELP   olmesartan  40 MG tablet Commonly known as: BENICAR  Take 40 mg by mouth daily.   rosuvastatin  40 MG tablet Commonly known as: CRESTOR  TAKE 1 TABLET (40 MG TOTAL) BY MOUTH EVERY MORNING. PLEASE KEEP SCHEDULED APPOINTMENT   sodium bicarbonate 650 MG tablet Take 650 mg by mouth 2 (two) times daily.   tamsulosin  0.4 MG Caps capsule Commonly known as: FLOMAX  TAKE ONE CAPSULE BY MOUTH DAILY   traZODone  50 MG tablet Commonly known as: DESYREL  Take 0.5-1 tablets (25-50 mg total) by mouth at bedtime as needed for sleep.   Vitamin D3 1000 units Caps Take 1,000 Units by mouth daily.        Followup:   Follow-up Information     Florencio Hunting, MD Follow up.   Specialty: Urology Why: 03/25/24 as scheduled Contact information: [address] [place] [address] [phone]

## 2024-03-13 NOTE — Progress Notes (Signed)
 Patient ID: Adam Harris, male   DOB: 23-May-1946, 78 y.o.   MRN: [id number]  7 Days Post-Op Subjective: Pt had Cr rise to 2.8 yesterday.  Nephrostomy therefore placed back to drainage.  No complaints.  Foot pain better.  Objective: Vital signs in last 24 hours: Temp:  [98 F (36.7 C)-98.5 F (36.9 C)] 98.5 F (36.9 C) (05/01 0625) Pulse Rate:  [66-89] 66 (05/01 0625) Resp:  [20] 20 (05/01 0625) BP: (140-152)/(80-87) 140/87 (05/01 0625) SpO2:  [91 %-98 %] 94 % (05/01 0625)  Intake/Output from previous day: 04/30 0701 - 05/01 0700 In: 2774.3 [P.O.:240; I.V.:1034.3] Out: 325 [Urine:325] Intake/Output this shift: No intake/output data recorded.  Physical Exam:  General: Alert and oriented Abd: L PCN draining well with clear urine  Lab Results: Recent Labs    03/11/24 0539  HGB 8.9*  HCT 28.9*   BMET Recent Labs    03/12/24 1313 03/13/24 0637  NA 135 135  K 4.5 4.0  CL 108 107  CO2 23 19*  GLUCOSE 173* 141*  BUN 40* 40*  CREATININE 2.75* 2.22*  CALCIUM  8.4* 8.1*     Studies/Results: DG Foot Complete Right Result Date: 03/11/2024 CLINICAL DATA:  Right foot pain. EXAM: RIGHT FOOT COMPLETE - 3+ VIEW COMPARISON:  None Available. FINDINGS: There is no evidence of fracture or dislocation. There is no evidence of arthropathy or other focal bone abnormality. Soft tissues are unremarkable. IMPRESSION: Negative. Electronically Signed   By: Rosalene Colon M.D.   On: 03/11/2024 14:47    Assessment/Plan: POD # 6 s/p cystoscopy, urethral dilation, bladder biopsies and fulguration   1) Hemorrhagic cystitis: Acute bleeding now stopped.  Hgb stable.  Continue ASA 81 mg.  Hold Plavix  until follow up.   2) CKD with AKI: Cr now improved again to 2.2 with nephrostomy to drain.  Will discharge home with PCN to drainage.   3) CAD/atrial fibrillation:  Rate controlled now.  Appreciate help from medicine/cardiology.   4) Bladder cancer:  Biopsies were negative for malignancy which still  raises doubt about true recurrence. Discussed with patient and his wife yesterday.  Will arrange outpatient follow up to discuss options of cystectomy vs continuing Keytruda .  Scheduled for outpatient consultation with me on 5/13.   6) Disposition:  D/C home today   LOS: 6 days   Kristeen Peto 03/13/2024, 7:27 AM

## 2024-03-13 NOTE — Plan of Care (Signed)
  Problem: Education: Goal: Knowledge of General Education information will improve Description: Including pain rating scale, medication(s)/side effects and non-pharmacologic comfort measures Outcome: Progressing   Problem: Activity: Goal: Risk for activity intolerance will decrease Outcome: Progressing   Problem: Nutrition: Goal: Adequate nutrition will be maintained Outcome: Progressing   Problem: Pain Managment: Goal: General experience of comfort will improve and/or be controlled Outcome: Progressing   Problem: Skin Integrity: Goal: Risk for impaired skin integrity will decrease Outcome: Progressing

## 2024-03-14 ENCOUNTER — Other Ambulatory Visit: Payer: Self-pay | Admitting: Hematology

## 2024-03-14 ENCOUNTER — Telehealth: Payer: Self-pay

## 2024-03-14 DIAGNOSIS — C679 Malignant neoplasm of bladder, unspecified: Secondary | ICD-10-CM

## 2024-03-14 NOTE — Transitions of Care (Post Inpatient/ED Visit) (Signed)
   03/14/2024  Name: Devin Marschall MRN: [id number] DOB: 28-Jul-1946  Today's TOC FU Call Status: Today's TOC FU Call Status:: Unsuccessful Call (1st Attempt) Unsuccessful Call (1st Attempt) Date: 03/14/24  Attempted to reach the patient regarding the most recent Inpatient/ED visit.  Follow Up Plan: Additional outreach attempts will be made to reach the patient to complete the Transitions of Care (Post Inpatient/ED visit) call.   Orpha Blade, RN, BSN, CEN [employer]- Transition of Care Team.  [facility] [phone]

## 2024-03-17 ENCOUNTER — Telehealth: Payer: Self-pay

## 2024-03-17 NOTE — Transitions of Care (Post Inpatient/ED Visit) (Signed)
 03/17/2024  Name: Adam Harris MRN: [id number] DOB: 11/18/1945  Today's TOC FU Call Status: Today's TOC FU Call Status:: Successful TOC FU Call Completed TOC FU Call Complete Date: 03/17/24 Patient's Name and Date of Birth confirmed.  Transition Care Management Follow-up Telephone Call How have you been since you were released from the hospital?: Better (feels weak,) Any questions or concerns?: No  Items Reviewed: Did you receive and understand the discharge instructions provided?: Yes Medications obtained,verified, and reconciled?: Yes (Medications Reviewed) Any new allergies since your discharge?: No Dietary orders reviewed?: Yes Type of Diet Ordered:: low salt, heart healthy diet Do you have support at home?: Yes People in Home [RPT]: spouse  Medications Reviewed Today: Medications Reviewed Today     Reviewed by Vanetta Generous, RN (Registered Nurse) on 03/17/24 at 1606  Med List Status: <None>   Medication Order Taking? Sig Documenting Provider Last Dose Status Informant  amLODipine  (NORVASC ) 5 MG tablet [id number] Yes TAKE 1 TABLET (5 MG TOTAL) BY MOUTH DAILY Eilleen Grates, MD Taking Active Self  aspirin  EC 81 MG tablet [id number] Yes Take 1 tablet (81 mg total) by mouth daily. Swallow whole. Florencio Hunting, MD Taking Active   Cholecalciferol  (VITAMIN D3) 1000 units CAPS [id number] Yes Take 1,000 Units by mouth daily. [provider] Taking Active Self  furosemide  (LASIX ) 20 MG tablet [id number] Yes Take 1 tablet (20 mg total) by mouth daily. For the next 3 days take 40 mg (2 tabs) daily then resume one tab daily. Eilleen Grates, MD Taking Active   hydrOXYzine  (VISTARIL ) 25 MG capsule [id number] No Take 1 capsule (25 mg total) by mouth every 8 (eight) hours as needed.  Patient not taking: Reported on 03/17/2024   Graig Lawyer, MD Not Taking Active Self  isosorbide  mononitrate (IMDUR ) 30 MG 24 hr tablet [id number] Yes TAKE ONE TABLET BY MOUTH DAILY Graig Lawyer, MD Taking  Active Self  JANUVIA  25 MG tablet [id number] Yes TAKE ONE TABLET BY MOUTH DAILY Graig Lawyer, MD Taking Active Self  metoprolol  tartrate (LOPRESSOR ) 25 MG tablet [id number] Yes TAKE ONE TABLET BY MOUTH TWICE A DAY Hochrein, James, MD Taking Active Self  nitroGLYCERIN  (NITROSTAT ) 0.4 MG SL tablet [id number] No TAKE ONE TABLET UNDER THE TONGUE EVERY FIVE MINUTES FOR THREE DOSES AS NEEDED FOR CHEST PAIN, CALL 911 IF 2ND DOSE DOESN'T HELP  Patient not taking: Reported on 03/17/2024   Eilleen Grates, MD Not Taking Active Self           Med Note Murphy Arn, BESSIE R   Thu Mar 06, 2024 11:49 AM) Has not needed  olmesartan  (BENICAR ) 40 MG tablet [id number] Yes Take 40 mg by mouth daily. [provider] Taking Active Self  Pembrolizumab  (KEYTRUDA  IV) [id number] No Inject into the vein every 14 (fourteen) days.  Patient not taking: Reported on 03/17/2024   [provider] Not Taking Active Self  rosuvastatin  (CRESTOR ) 40 MG tablet [id number] Yes TAKE 1 TABLET (40 MG TOTAL) BY MOUTH EVERY MORNING. PLEASE KEEP SCHEDULED APPOINTMENT Eilleen Grates, MD Taking Active Self  sodium bicarbonate 650 MG tablet [id number] Yes Take 650 mg by mouth 2 (two) times daily. [provider] Taking Active Self  tamsulosin  (FLOMAX ) 0.4 MG CAPS capsule [id number] Yes TAKE ONE CAPSULE BY MOUTH DAILY Graig Lawyer, MD Taking Active   traZODone  (DESYREL ) 50 MG tablet [id number] Yes Take 0.5-1 tablets (25-50 mg total) by mouth at bedtime as needed for sleep. Graig Lawyer, MD Taking Active Self  Home Care and Equipment/Supplies: Were Home Health Services Ordered?: No Any new equipment or medical supplies ordered?: No  Functional Questionnaire: Do you need assistance with bathing/showering or dressing?: Yes (wife) Do you need assistance with meal preparation?: Yes (wife) Do you need assistance with eating?: No Do you have difficulty maintaining continence: No Do you need assistance with  getting out of bed/getting out of a chair/moving?: No Do you have difficulty managing or taking your medications?: No  Follow up appointments reviewed: PCP Follow-up appointment confirmed?: No (patient will call and get an appointment) MD Provider Line Number:[phone] Given: No Specialist Hospital Follow-up appointment confirmed?: Yes Date of Specialist follow-up appointment?: 03/25/24 Follow-Up Specialty Provider:: Dr. Rozanne Corners Do you need transportation to your follow-up appointment?: No Do you understand care options if your condition(s) worsen?: Yes-patient verbalized understanding  SDOH Interventions Today    Flowsheet Row Most Recent Value  SDOH Interventions   Food Insecurity Interventions Intervention Not Indicated  Housing Interventions Intervention Not Indicated  Transportation Interventions Intervention Not Indicated  Utilities Interventions Intervention Not Indicated     Patient reports that he is doing well. Reports he has good support at home. States he has gotten use to his drainage bag and can manage it himself. Reports that he takes all his medications as prescribed.  Reviewed signs of infection and when to call MD. Encouraged patient to get hospital follow up appointment with PCP.  Patient states his only concerns is walking up and down the steps in his home. Reports that he is able to manage but might need something to help him in the future. Reports that he uses a 4 pronged cane.  Reviewed stair lifts.  Reviewed with patient 30 day TOC program and patient declined. Provided my contact information for patient to call me if needed.  Orpha Blade, RN, BSN, CEN [employer]- Transition of Care Team.  [facility] [phone]

## 2024-03-18 ENCOUNTER — Encounter: Payer: Self-pay | Admitting: *Deleted

## 2024-03-18 NOTE — Telephone Encounter (Signed)
 This encounter was created in error - please disregard.

## 2024-03-20 ENCOUNTER — Telehealth: Payer: Self-pay

## 2024-03-20 ENCOUNTER — Inpatient Hospital Stay

## 2024-03-20 ENCOUNTER — Inpatient Hospital Stay: Admitting: Hematology

## 2024-03-20 ENCOUNTER — Inpatient Hospital Stay: Attending: Hematology

## 2024-03-20 NOTE — Telephone Encounter (Signed)
 Patient did not arrive to his appointments today. Attempted to contact the patient and the patient's spouse.  Unable to reach the patient or his spouse.  LVM to contact the facility.

## 2024-03-20 NOTE — Assessment & Plan Note (Deleted)
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  He has had multiple BCG maintenance therapy, is BCG refractory. -Given his BCG refractory disease, recurrence in bladder and the urethra, I recommend systemic therapy with Keytruda  every 3 weeks (or every 6 weeks if he tolerates well) for 2 years, per NCCN guideline.  He had no history of autoimmune disease, overall in good health, there is no contraindication for immunotherapy. --he started on 06/15/2023. C4 was postponed due to dyspnea and hypoxia on exertion, CT chest was negative for pneumonitis  -repeated cystoscopy and cytology was still positive in early Nov 2024. I discussed with Dr. Rozanne Corners and we decide continue Keytruda  for additional 3 months and re-evaluate  -Cystoscopy was repeated in 02/2024 but complicated with significant bleeding and was not able to see well  -will continue Keytruda  for now

## 2024-03-25 DIAGNOSIS — D090 Carcinoma in situ of bladder: Secondary | ICD-10-CM | POA: Diagnosis not present

## 2024-03-25 DIAGNOSIS — Z8551 Personal history of malignant neoplasm of bladder: Secondary | ICD-10-CM | POA: Diagnosis not present

## 2024-03-25 DIAGNOSIS — C678 Malignant neoplasm of overlapping sites of bladder: Secondary | ICD-10-CM | POA: Diagnosis not present

## 2024-04-01 ENCOUNTER — Ambulatory Visit: Admitting: Family Medicine

## 2024-04-01 ENCOUNTER — Encounter: Payer: Self-pay | Admitting: Family Medicine

## 2024-04-01 VITALS — BP 118/68 | HR 71 | Temp 97.1°F | Ht 71.0 in | Wt 210.6 lb

## 2024-04-01 DIAGNOSIS — Z936 Other artificial openings of urinary tract status: Secondary | ICD-10-CM

## 2024-04-01 DIAGNOSIS — D62 Acute posthemorrhagic anemia: Secondary | ICD-10-CM | POA: Diagnosis not present

## 2024-04-01 DIAGNOSIS — C689 Malignant neoplasm of urinary organ, unspecified: Secondary | ICD-10-CM | POA: Diagnosis not present

## 2024-04-01 DIAGNOSIS — N184 Chronic kidney disease, stage 4 (severe): Secondary | ICD-10-CM

## 2024-04-01 DIAGNOSIS — E782 Mixed hyperlipidemia: Secondary | ICD-10-CM | POA: Diagnosis not present

## 2024-04-01 LAB — BASIC METABOLIC PANEL WITH GFR
BUN: 38 mg/dL — ABNORMAL HIGH (ref 6–23)
CO2: 24 meq/L (ref 19–32)
Calcium: 9.2 mg/dL (ref 8.4–10.5)
Chloride: 101 meq/L (ref 96–112)
Creatinine, Ser: 2.01 mg/dL — ABNORMAL HIGH (ref 0.40–1.50)
GFR: 31.44 mL/min — ABNORMAL LOW (ref >60.00)
Glucose, Bld: 142 mg/dL — ABNORMAL HIGH (ref 70–99)
Potassium: 4.5 meq/L (ref 3.5–5.1)
Sodium: 135 meq/L (ref 135–145)

## 2024-04-01 LAB — LIPID PANEL
Cholesterol: 87 mg/dL (ref 0–200)
HDL: 28.8 mg/dL — ABNORMAL LOW (ref >39.00)
LDL Cholesterol: 50 mg/dL (ref 0–99)
NonHDL: 57.98
Total CHOL/HDL Ratio: 3
Triglycerides: 42 mg/dL (ref 0.0–149.0)
VLDL: 8.4 mg/dL (ref 0.0–40.0)

## 2024-04-01 LAB — CBC
HCT: 30.7 % — ABNORMAL LOW (ref 39.0–52.0)
Hemoglobin: 9.6 g/dL — ABNORMAL LOW (ref 13.0–17.0)
MCHC: 31.4 g/dL (ref 30.0–36.0)
MCV: 77.8 fl — ABNORMAL LOW (ref 78.0–100.0)
Platelets: 342 10*3/uL (ref 150.0–400.0)
RBC: 3.95 Mil/uL — ABNORMAL LOW (ref 4.22–5.81)
RDW: 18.8 % — ABNORMAL HIGH (ref 11.5–15.5)
WBC: 11.2 10*3/uL — ABNORMAL HIGH (ref 4.0–10.5)

## 2024-04-01 NOTE — Assessment & Plan Note (Signed)
 Stable. Appears to be functioning well.

## 2024-04-01 NOTE — Assessment & Plan Note (Signed)
 I will recheck his renal function today, in light of his recent AKI superimposed on his Stage 4 CKD. He plans to follow-up with nephrology.

## 2024-04-01 NOTE — Assessment & Plan Note (Signed)
 Currently on Keytruda . Continue to follow with Dr. Maryalice Smaller (oncology).

## 2024-04-01 NOTE — Progress Notes (Signed)
 [facility] PRIMARY CARE [facility] [address] [place] [address] Dept: [phone] Dept Fax: [phone]  Hospital Follow-up Visit  Subjective:    Patient ID: Adam Harris, male    DOB: [birth year], 78 y.o..   MRN: [id number]  Chief Complaint  Patient presents with   Hospitalization Follow-up    Hospital f/u from 03/06/24.  C/o still not feeling better.     History of Present Illness:  Patient is in today for follow-up from a recent hospitalization. Adam Harris was admitted at [facility] 4/24-03/13/2024. He was initially at the hospital for bladder biopsy related to his urothelial carcinoma and carcinoma in situ of the bladder and prostatic urethra. He has had a prior right nephroureterectomy. He also has a history of prior prostate cancer. He has been managed on Keytruda  infusions every 2 weeks. His bladder cancer had been complicated with hematuria and chronic dysuria. During his initial cystoscopy, he had some urethral stricture which was dilated. Multiple biopsies were taken. However, he developed significant hematuria afterwards requiring a 2nd cystoscopy procedure. He developed a post-operative anemia due to blood loss and received a total of 4 units of blood. A nephrostomy was placed on the left to divert his urine. He had some AKI present prior to the nephrostomy tube placement.  Since discharge Adam Harris does not he is no longer having the burning sensation that had been so troublesome prior to the nephrostomy tube placement. He is not happy about the need for the nephrostomy. He has been back to oncology and they do plan to continue Keytruda  treatments for now. He feels overall dejected about his health and the complications he has been suffering. He is worried about the possible need for dialysis and death.  Past Medical History: Patient Active Problem List   Diagnosis Date Noted   AKI (acute kidney injury) (HCC) 03/08/2024   Tachyarrhythmia 03/07/2024    Hematuria 03/07/2024   Acute blood loss anemia 03/07/2024   Dysuria 01/25/2024   Presence of Watchman left atrial appendage closure device 12/13/2023   Permanent atrial fibrillation (HCC) 11/29/2023   Hx of hematuria 11/29/2023   Lower urinary tract symptoms (LUTS) 04/06/2023   CKD (chronic kidney disease), stage IV (HCC) 03/22/2023   Prostate cancer (HCC) 06/21/2022   Cataract cortical, senile, left 12/13/2021   Diabetic peripheral neuropathy (HCC) 12/13/2021   Hydrocele- Left 12/02/2021   Solitary kidney, acquired 10/26/2021   Hypertensive renal disease 10/26/2021   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   Chronic diastolic CHF (congestive heart failure), NYHA class 2 (HCC) 04/02/2017   Atrial fibrillation with RVR (HCC) 11/20/2016   OA (osteoarthritis) of knee 05/31/2016   Low back pain 05/31/2016   Exertional angina (HCC) 10/29/2014   Coronary artery disease involving native coronary artery of native heart with angina pectoris (HCC) 03/09/2014   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/2014   Hyperlipidemia 02/05/2014   History of non-ST elevation myocardial infarction (NSTEMI) 02/03/2014   Essential hypertension 02/03/2014   Anxiety state 04/19/2013   History of prostate cancer 04/19/2013   Insomnia 04/19/2013   Stiffness of joint, lower leg 04/19/2013   Sciatica 04/19/2013   Past Surgical History:  Procedure Laterality Date   CARDIAC CATHETERIZATION N/A 11/21/2016   Procedure: Left Heart Cath and Coronary Angiography;  Surgeon: Millicent Ally, MD;  Location: [facility] INVASIVE CV LAB;  Service: Cardiovascular;  Laterality: N/A;  pRCA 100% ,  ostial LAD 45%, OM3 80%, mCFX to dCFX 100% (AV groove), lateral OM3 50%   COLONOSCOPY     CYSTOSCOPY WITH BIOPSY N/A 08/12/2020   Procedure: CYSTOSCOPY WITH BLADDER BIOPSY AND TRANSURETHRAL RESECTION OF BLADDER TUMOR;  Surgeon: Florencio Hunting, MD;  Location: [facility];  Service: Urology;  Laterality: N/A;   CYSTOSCOPY WITH BIOPSY N/A 11/10/2021   Procedure: CYSTOSCOPY WITH BLADDER BIOPSIES/ LEFT RETROGRADE;  Surgeon: Florencio Hunting, MD;  Location: [facility];  Service: Urology;  Laterality: N/A;   CYSTOSCOPY WITH BIOPSY Left 07/27/2022   Procedure: CYSTOSCOPY WITH  BLADDER BIOPSY, TRANSURETHRAL FULGERATION OF BLADDER, EXAM UNDER ANESTHESIA, LEFT RETROGRADE PYELOGRAM;  Surgeon: Florencio Hunting, MD;  Location: [facility];  Service: Urology;  Laterality: Left;   CYSTOSCOPY WITH BIOPSY N/A 04/23/2023   Procedure: CYSTOSCOPY WITH BLADDER AND PROSTATIC URETHRAL BIOPSIES;  Surgeon: Florencio Hunting, MD;  Location: [facility];  Service: Urology;  Laterality: N/A;  60 MINUTES NEEDED FOR CASE   CYSTOSCOPY WITH BIOPSY N/A 03/06/2024   Procedure: CYSTOSCOPY, WITH BIOPSY;  Surgeon: Florencio Hunting, MD;  Location: [facility];  Service: Urology;  Laterality: N/A;  CYSTOSCOPY WITH BLADDER BIOPSIES   CYSTOSCOPY WITH FULGERATION N/A 03/06/2024   Procedure: CYSTOSCOPY, WITH BLADDER FULGURATION, TURBT;  Surgeon: Florencio Hunting, MD;  Location: [facility];  Service: Urology;  Laterality: N/A;   CYSTOSCOPY WITH URETEROSCOPY AND STENT PLACEMENT Right 12/15/2019   Procedure: CYSTOSCOPY WITH RIGHT RETROGRADE/ RIGHT URETEROSCOPY/ BIOPSY;  Surgeon: Ottelin, Mark, MD;  Location: [facility];  Service: Urology;  Laterality: Right;   CYSTOSCOPY/RETROGRADE/URETEROSCOPY Bilateral 03/18/2018   Procedure: CYSTOSCOPY/RETROGRADE/URETEROSCOPY.;  Surgeon: Ottelin, Mark, MD;  Location: [facility];  Service: Urology;  Laterality: Bilateral;   EYE SURGERY Bilateral    cateract in January 2023   HYDROCELE EXCISION Left 07/27/2022   Procedure: HYDROCELE REPAIR;  Surgeon: Florencio Hunting, MD;  Location: [facility];  Service: Urology;  Laterality: Left;  GENERAL ANESTHESIA WITH PARALYSIS   IR NEPHROSTOMY EXCHANGE LEFT  03/10/2024   IR NEPHROSTOMY PLACEMENT LEFT  03/07/2024   KNEE  ARTHROSCOPY Right    LEFT ATRIAL APPENDAGE OCCLUSION N/A 12/13/2023   Procedure: LEFT ATRIAL APPENDAGE OCCLUSION;  Surgeon: Ardeen Kohler, MD;  Location: [facility] INVASIVE CV LAB;  Service: Cardiovascular;  Laterality: N/A;   LEFT HEART CATHETERIZATION WITH CORONARY ANGIOGRAM N/A 02/04/2014   Procedure: LEFT HEART CATHETERIZATION WITH CORONARY ANGIOGRAM;  Surgeon: Wenona Hamilton, MD;  Location: [facility] CATH LAB;  Service: Cardiovascular;  Laterality: N/A;  severe one-vessel CAD, chronically occluded RCA with faint left-to-right collaterals;  aneurysmal LCFx with sluggish coronary flow;  normal LVSF w/ moderately elevated LVEDP (ostialOM2 20%, pOM3 20%, pD1 20%, mCFX 50%, diffuse 20% pCFX)   PROSTATE BIOPSY N/A 08/12/2020   Procedure: BIOPSY TRANSRECTAL ULTRASONIC PROSTATE (TUBP);  Surgeon: Florencio Hunting, MD;  Location: [facility];  Service: Urology;  Laterality: N/A;   ROBOT ASSITED LAPAROSCOPIC NEPHROURETERECTOMY Right 03/22/2020   Procedure: XI ROBOT ASSITED LAPAROSCOPIC NEPHROURETERECTOMY;  Surgeon: Florencio Hunting, MD;  Location: [facility];  Service: Urology;  Laterality: Right;   TRANSESOPHAGEAL ECHOCARDIOGRAM (CATH LAB) N/A 12/13/2023   Procedure: TRANSESOPHAGEAL ECHOCARDIOGRAM;  Surgeon: Ardeen Kohler, MD;  Location: [facility] INVASIVE CV LAB;  Service: Cardiovascular;  Laterality: N/A;   TRANSESOPHAGEAL ECHOCARDIOGRAM (CATH LAB) N/A 02/11/2024   Procedure: TRANSESOPHAGEAL ECHOCARDIOGRAM;  Surgeon: Harrold Lincoln, MD;  Location: [facility] INVASIVE CV LAB;  Service: Cardiovascular;  Laterality: N/A;   TRANSTHORACIC ECHOCARDIOGRAM  11-22-2016   dr hochrein   ef 55-60%/ mild MR and TR/ moderate  LAE   TRANSURETHRAL RESECTION OF BLADDER TUMOR N/A 02/10/2021   Procedure: TRANSURETHRAL RESECTION OF BLADDER TUMOR (TURBT)/ CYSTOSCOPY/ LEFT RETROGRADE;  Surgeon: Florencio Hunting, MD;  Location: [facility];  Service: Urology;  Laterality: N/A;  GENERAL ANESTHESIA WITH PARALYSIS   Family History  Problem Relation Age of Onset    Hypertension Mother    Cancer Mother        anal cancer   Esophageal cancer Neg Hx    Pancreatic cancer Neg Hx    Stomach cancer Neg Hx    Liver disease Neg Hx    Outpatient Medications Prior to Visit  Medication Sig Dispense Refill   amLODipine  (NORVASC ) 5 MG tablet TAKE 1 TABLET (5 MG TOTAL) BY MOUTH DAILY 90 tablet 3   aspirin  EC 81 MG tablet Take 1 tablet (81 mg total) by mouth daily. Swallow whole. 30 tablet 12   Cholecalciferol  (VITAMIN D3) 1000 units CAPS Take 1,000 Units by mouth daily.     furosemide  (LASIX ) 20 MG tablet Take 1 tablet (20 mg total) by mouth daily. For the next 3 days take 40 mg (2 tabs) daily then resume one tab daily. 96 tablet 3   isosorbide  mononitrate (IMDUR ) 30 MG 24 hr tablet TAKE ONE TABLET BY MOUTH DAILY 90 tablet 3   JANUVIA  25 MG tablet TAKE ONE TABLET BY MOUTH DAILY 60 tablet 0   metoprolol  tartrate (LOPRESSOR ) 25 MG tablet TAKE ONE TABLET BY MOUTH TWICE A DAY 180 tablet 2   nitroGLYCERIN  (NITROSTAT ) 0.4 MG SL tablet TAKE ONE TABLET UNDER THE TONGUE EVERY FIVE MINUTES FOR THREE DOSES AS NEEDED FOR CHEST PAIN, CALL 911 IF 2ND DOSE DOESN'T HELP 25 tablet 4   olmesartan  (BENICAR ) 40 MG tablet Take 40 mg by mouth daily.     rosuvastatin  (CRESTOR ) 40 MG tablet TAKE 1 TABLET (40 MG TOTAL) BY MOUTH EVERY MORNING. PLEASE KEEP SCHEDULED APPOINTMENT 90 tablet 3   sodium bicarbonate 650 MG tablet Take 650 mg by mouth 2 (two) times daily.     tamsulosin  (FLOMAX ) 0.4 MG CAPS capsule TAKE ONE CAPSULE BY MOUTH DAILY 30 capsule 11   traZODone  (DESYREL ) 50 MG tablet Take 0.5-1 tablets (25-50 mg total) by mouth at bedtime as needed for sleep. 30 tablet 3   hydrOXYzine  (VISTARIL ) 25 MG capsule Take 1 capsule (25 mg total) by mouth every 8 (eight) hours as needed. (Patient not taking: Reported on 03/17/2024) 30 capsule 2   Pembrolizumab  (KEYTRUDA  IV) Inject into the vein every 14 (fourteen) days. (Patient not taking: Reported on 03/17/2024)     No facility-administered  medications prior to visit.   Allergies  Allergen Reactions   Xarelto  [Rivaroxaban ] Other (See Comments)    Skin Blisters      Objective:   Today's Vitals   04/01/24 1059  BP: 118/68  Pulse: 71  Temp: (!) 97.1 F (36.2 C)  TempSrc: Temporal  SpO2: 99%  Weight: 210 lb 9.6 oz (95.5 kg)  Height: 5\' 11"  (1.803 m)   Body mass index is 29.37 kg/m.   General: Well developed, well nourished. No acute distress. Psych: Alert and oriented. Normal mood and affect.  Health Maintenance Due  Topic Date Due   Zoster Vaccines- Shingrix (1 of 2) Never done   OPHTHALMOLOGY EXAM  05/18/2022   Lab Results    Latest Ref Rng & Units 03/11/2024    5:39 AM 03/10/2024    5:35 AM 03/09/2024   11:13 AM  CBC  WBC 4.0 - 10.5 K/uL  11.8  13.6   Hemoglobin 13.0 - 17.0 g/dL 8.9  8.3  8.5   Hematocrit 39.0 - 52.0 % 28.9  26.6  26.8   Platelets 150 - 400 K/uL  147  126       Latest Ref Rng & Units 03/13/2024    6:37 AM 03/12/2024    1:13 PM 03/12/2024    5:53 AM  CMP  Glucose 70 - 99 mg/dL 045  409  811   BUN 8 - 23 mg/dL 40  40  36   Creatinine 0.61 - 1.24 mg/dL 9.14  7.82  9.56   Sodium 135 - 145 mmol/L 135  135  136   Potassium 3.5 - 5.1 mmol/L 4.0  4.5  4.5   Chloride 98 - 111 mmol/L 107  108  106   CO2 22 - 32 mmol/L 19  23  21    Calcium  8.9 - 10.3 mg/dL 8.1  8.4  8.3    Lab Results  Component Value Date   HGBA1C 6.8 (H) 03/07/2024   HGBA1C 6.7 (H) 08/06/2023   HGBA1C 6.7 (H) 04/13/2023     Assessment & Plan:   Problem List Items Addressed This Visit       Genitourinary   CKD (chronic kidney disease), stage IV (HCC)   I will recheck his renal function today, in light of his recent AKI superimposed on his Stage 4 CKD. He plans to follow-up with nephrology.      Relevant Orders   Basic metabolic panel with GFR     Other   Acute blood loss anemia - Primary   I will recheck his CBC today to see how much his transfusions may have corrected his anemia.      Relevant Orders    CBC   Hyperlipidemia   I will check lipids today. Continue rosuvastatin  40 mg daily.      Relevant Orders   Lipid panel   Nephrostomy present (HCC)   Stable. Appears to be functioning well.      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 Continue to follow with Dr. Maryalice Smaller (oncology).       Return for Follow-up as scheduled.   Graig Lawyer, MD

## 2024-04-01 NOTE — Assessment & Plan Note (Signed)
 I will check lipids today. Continue rosuvastatin  40 mg daily.

## 2024-04-01 NOTE — Assessment & Plan Note (Signed)
 I will recheck his CBC today to see how much his transfusions may have corrected his anemia.

## 2024-04-02 ENCOUNTER — Ambulatory Visit: Payer: Self-pay | Admitting: Family Medicine

## 2024-04-04 ENCOUNTER — Other Ambulatory Visit: Payer: Self-pay | Admitting: Family Medicine

## 2024-04-04 DIAGNOSIS — E1159 Type 2 diabetes mellitus with other circulatory complications: Secondary | ICD-10-CM

## 2024-04-10 ENCOUNTER — Inpatient Hospital Stay

## 2024-04-10 ENCOUNTER — Other Ambulatory Visit: Payer: Self-pay | Admitting: Cardiology

## 2024-04-10 ENCOUNTER — Ambulatory Visit: Payer: Self-pay

## 2024-04-10 ENCOUNTER — Inpatient Hospital Stay: Admitting: Hematology

## 2024-04-10 NOTE — Telephone Encounter (Signed)
 Copied From CRM [id number]. Reason for Triage: Patients wife called because Adam Harris knees have been giving him trouble and she is requesting a sooner appointment if possible. She also wanted to talk to Dr. Therese Flash about getting him a seated walker.  Chief Complaint: Bilateral knee pain Symptoms: Swelling, neck pain Frequency: A week Pertinent Negatives: Patient denies redness Disposition: [] ED /[] Urgent Care (no appt availability in office) / [x] Appointment(In office/virtual)/ []  [facility]/ [] Home Care/ [x] Refused Recommended Disposition /[] [facility]/ []  Follow-up with PCP Additional Notes: This RN was able to connect with patient and his wife. Patient has been experiencing bilateral knee pain and swelling for about a week. Patient stated the pain increases to severe whenever he tries to stand up and walk. Patient stated sitting in the car for long periods of time exacerbates the pain. Patient also mentioned that the pain caused him to have a fall a few nights ago and he is is currently experiencing neck pain. Patient denied hitting his head. Advised patient to be seen within 4 hours, per protocol. No availability with PCP. Offered available appointment for this afternoon. Patient declined because he only wants to see his PCP. Patient is requesting a call back from the office to schedule a sooner appointment with his PCP. Patient would also like to know if his PCP would be able to order him a seated walker. Please advise.   Reason for Disposition  [1] SEVERE pain (e.g., excruciating, unable to walk) AND [2] not improved after 2 hours of pain medicine  Answer Assessment - Initial Assessment Questions 1. LOCATION and RADIATION: "Where is the pain located?"      Bilateral knees 2. QUALITY: "What does the pain feel like?"  (e.g., sharp, dull, aching, burning)     Aching  3. SEVERITY: "How bad is the pain?" "What does it keep you from doing?"   (Scale 1-10; or mild, moderate,  severe)   -  MILD (1-3): doesn't interfere with normal activities    -  MODERATE (4-7): interferes with normal activities (e.g., work or school) or awakens from sleep, limping    -  SEVERE (8-10): excruciating pain, unable to do any normal activities, unable to walk     Rates pain a 10 when standing up and walking, pain decreases when he sits down 4. ONSET: "When did the pain start?" "Does it come and go, or is it there all the time?"     About a week, states this is something that has been ongoing, but is worsening 7. AGGRAVATING FACTORS: "What makes the knee pain worse?" (e.g., walking, climbing stairs, running)     Sitting the car for a long period of time 8. ASSOCIATED SYMPTOMS: "Is there any swelling or redness of the knee?"     Swelling, denies redness  9. OTHER SYMPTOMS: "Do you have any other symptoms?" (e.g., chest pain, difficulty breathing, fever, calf pain)     Neck pain- fell the other night- denies hitting head States he is taking fluid pills to help with the swelling in his knees  Protocols used: Knee Pain-A-AH

## 2024-04-11 NOTE — Telephone Encounter (Signed)
 Called patient and he states that his knees are feeling better and has gotten a walker from [facility].  I did move his appointment up to 04/18/24 @ 1:00 pm. Dm/cma

## 2024-04-14 ENCOUNTER — Ambulatory Visit: Payer: Self-pay

## 2024-04-14 DIAGNOSIS — C678 Malignant neoplasm of overlapping sites of bladder: Secondary | ICD-10-CM | POA: Diagnosis not present

## 2024-04-14 NOTE — Telephone Encounter (Signed)
 Patient seeing Gavin Kast today @ 1:00 pm. Dm/cma

## 2024-04-14 NOTE — Telephone Encounter (Signed)
 Copied from CRM [id number]. Topic: Clinical - Red Word Triage >> Apr 14, 2024  1:11 PM Emmet Harm C wrote: [facility] that prompted transfer to Nurse Triage: Wife believes husband has a kidney affect, he only has 1 left the other remove because of cancer   Chief Complaint: Foul smelling urine  Symptoms: Foul smelling urine, cloudy urine Pertinent Negatives: Patient denies flank pain Disposition: [] ED /[] Urgent Care (no appt availability in office) / [x] Appointment(In office/virtual)/ []  [facility]/ [] Home Care/ [] Refused Recommended Disposition /[] [facility]/ []  Follow-up with PCP Additional Notes: Patient states he has a nephrostomy tube and noticed the urine for the last couple of weeks has been cloudy and foul smelling. He states he has also been passing dark urine from his bladder. He denies any pain with his symptoms. Appointment made for tomorrow for evaluation.     Reason for Disposition  Bad or foul-smelling urine  Answer Assessment - Initial Assessment Questions 1. SYMPTOM: "What's the main symptom you're concerned about?" (e.g., frequency, incontinence)     Foul smelling and cloudy urine  2. ONSET: "When did the symptoms start?"     A couple of weeks 3. PAIN: "Is there any pain?" If Yes, ask: "How bad is it?" (Scale: 1-10; mild, moderate, severe)     No pain 4. CAUSE: "What do you think is causing the symptoms?"     Possible infection  5. OTHER SYMPTOMS: "Do you have any other symptoms?" (e.g., blood in urine, fever, flank pain, pain with urination)     No  Protocols used: Urinary Symptoms-A-AH

## 2024-04-15 ENCOUNTER — Ambulatory Visit: Admitting: Internal Medicine

## 2024-04-18 ENCOUNTER — Encounter: Payer: Self-pay | Admitting: Family Medicine

## 2024-04-18 ENCOUNTER — Ambulatory Visit: Admitting: Family Medicine

## 2024-04-18 ENCOUNTER — Telehealth: Payer: Self-pay | Admitting: Family Medicine

## 2024-04-18 VITALS — BP 124/72 | HR 85 | Temp 97.8°F | Ht 71.0 in | Wt 209.2 lb

## 2024-04-18 DIAGNOSIS — I5032 Chronic diastolic (congestive) heart failure: Secondary | ICD-10-CM | POA: Diagnosis not present

## 2024-04-18 DIAGNOSIS — R600 Localized edema: Secondary | ICD-10-CM | POA: Diagnosis not present

## 2024-04-18 DIAGNOSIS — I10 Essential (primary) hypertension: Secondary | ICD-10-CM

## 2024-04-18 DIAGNOSIS — D62 Acute posthemorrhagic anemia: Secondary | ICD-10-CM

## 2024-04-18 DIAGNOSIS — R5383 Other fatigue: Secondary | ICD-10-CM

## 2024-04-18 MED ORDER — OLMESARTAN MEDOXOMIL 40 MG PO TABS: 40.0000 mg | ORAL_TABLET | Freq: Every day | ORAL | 0 refills | Status: DC

## 2024-04-18 MED ORDER — METOLAZONE 2.5 MG PO TABS: 2.5000 mg | ORAL_TABLET | Freq: Every day | ORAL | 0 refills | Status: DC

## 2024-04-18 MED ORDER — OLMESARTAN MEDOXOMIL 40 MG PO TABS
40.0000 mg | ORAL_TABLET | Freq: Every day | ORAL | 0 refills | Status: DC
Start: 2024-04-18 — End: 2024-04-18

## 2024-04-18 NOTE — Progress Notes (Signed)
 [facility] [address] [place] [address] Dept: [phone] Dept Fax: [phone]  Office Visit  Subjective:    Patient ID: Adam Harris, male    DOB: Oct 16, 1946, 78 y.o..   MRN: [id number]  Chief Complaint  Patient presents with   Knee Pain    C/o having knee pain,     History of Present Illness:  Patient is in today for with several issues. Although he does not an increase in his joint pain, esp. his knees, he is more concerned about his general lack of energy and fatigue. he notes this has been an issue since he had his last cystoscopy. 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 His bladder cancer had been complicated with hematuria and chronic dysuria. During his initial cystoscopy, he had some urethral stricture which was dilated. Multiple biopsies were taken. However, he developed significant hematuria afterwards requiring a 2nd cystoscopy procedure. He developed a post-operative anemia due to blood loss and received a total of 4 units of blood. A nephrostomy was placed on the left to divert his urine. He is not seeing active bleeding any longer.   Adam Harris has a history of chronic diastolic heart failure. He is managed on metoprolol  tartrate 25 mg twice daily, olmesartan  40 mg daily, dapagliflozin 10 mg daily and PRN furosemide . He notes he has been taking 40 mg bid. Despite this, he has noted quite a bit of swelling of his lower legs. He is not having dyspnea.  Past Medical History: Patient Active Problem List   Diagnosis Date Noted   Nephrostomy present (HCC) 04/01/2024   AKI (acute kidney injury) (HCC) 03/08/2024   Tachyarrhythmia 03/07/2024   Hematuria 03/07/2024   Acute blood loss anemia 03/07/2024   Dysuria 01/25/2024   Presence of Watchman left atrial appendage closure device 12/13/2023   Permanent atrial fibrillation (HCC) 11/29/2023   Hx  of hematuria 11/29/2023   Lower urinary tract symptoms (LUTS) 04/06/2023   CKD (chronic kidney disease), stage IV (HCC) 03/22/2023   Prostate cancer (HCC) 06/21/2022   Cataract cortical, senile, left 12/13/2021   Diabetic peripheral neuropathy (HCC) 12/13/2021   Hydrocele- Left 12/02/2021   Solitary kidney, acquired 10/26/2021   Hypertensive renal disease 10/26/2021   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   Chronic diastolic CHF (congestive heart failure), NYHA class 2 (HCC) 04/02/2017   Atrial fibrillation with RVR (HCC) 11/20/2016   OA (osteoarthritis) of knee 05/31/2016   Low back pain 05/31/2016   Exertional angina (HCC) 10/29/2014   Coronary artery disease involving native coronary artery of native heart with angina pectoris (HCC) 03/09/2014   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/2014   Hyperlipidemia 02/05/2014   History of non-ST elevation myocardial infarction (NSTEMI) 02/03/2014   Essential hypertension 02/03/2014   Anxiety state 04/19/2013   History of prostate cancer 04/19/2013   Insomnia 04/19/2013   Stiffness of joint, lower leg 04/19/2013   Sciatica 04/19/2013   Past Surgical History:  Procedure Laterality Date   CARDIAC CATHETERIZATION N/A 11/21/2016   Procedure: Left Heart Cath and Coronary Angiography;  Surgeon: Millicent Ally, MD;  Location: [facility] INVASIVE CV [facility];  Service: Cardiovascular;  Laterality: N/A;  pRCA 100% , ostial LAD 45%, OM3 80%, mCFX to dCFX 100% (AV groove), lateral OM3 50%   COLONOSCOPY  CYSTOSCOPY WITH BIOPSY N/A 08/12/2020   Procedure: CYSTOSCOPY WITH BLADDER BIOPSY AND TRANSURETHRAL RESECTION OF BLADDER TUMOR;  Surgeon: Florencio Hunting, MD;  Location: [facility];  Service: Urology;  Laterality: N/A;   CYSTOSCOPY WITH BIOPSY N/A 11/10/2021   Procedure: CYSTOSCOPY WITH BLADDER BIOPSIES/ LEFT RETROGRADE;  Surgeon: Florencio Hunting, MD;   Location: [facility];  Service: Urology;  Laterality: N/A;   CYSTOSCOPY WITH BIOPSY Left 07/27/2022   Procedure: CYSTOSCOPY WITH  BLADDER BIOPSY, TRANSURETHRAL FULGERATION OF BLADDER, EXAM UNDER ANESTHESIA, LEFT RETROGRADE PYELOGRAM;  Surgeon: Florencio Hunting, MD;  Location: [facility];  Service: Urology;  Laterality: Left;   CYSTOSCOPY WITH BIOPSY N/A 04/23/2023   Procedure: CYSTOSCOPY WITH BLADDER AND PROSTATIC URETHRAL BIOPSIES;  Surgeon: Florencio Hunting, MD;  Location: [facility];  Service: Urology;  Laterality: N/A;  60 MINUTES NEEDED FOR CASE   CYSTOSCOPY WITH BIOPSY N/A 03/06/2024   Procedure: CYSTOSCOPY, WITH BIOPSY;  Surgeon: Florencio Hunting, MD;  Location: [facility];  Service: Urology;  Laterality: N/A;  CYSTOSCOPY WITH BLADDER BIOPSIES   CYSTOSCOPY WITH FULGERATION N/A 03/06/2024   Procedure: CYSTOSCOPY, WITH BLADDER FULGURATION, TURBT;  Surgeon: Florencio Hunting, MD;  Location: [facility];  Service: Urology;  Laterality: N/A;   CYSTOSCOPY WITH URETEROSCOPY AND STENT PLACEMENT Right 12/15/2019   Procedure: CYSTOSCOPY WITH RIGHT RETROGRADE/ RIGHT URETEROSCOPY/ BIOPSY;  Surgeon: Ottelin, Mark, MD;  Location: [facility];  Service: Urology;  Laterality: Right;   CYSTOSCOPY/RETROGRADE/URETEROSCOPY Bilateral 03/18/2018   Procedure: CYSTOSCOPY/RETROGRADE/URETEROSCOPY.;  Surgeon: Ottelin, Mark, MD;  Location: [facility];  Service: Urology;  Laterality: Bilateral;   EYE SURGERY Bilateral    cateract in January 2023   HYDROCELE EXCISION Left 07/27/2022   Procedure: HYDROCELE REPAIR;  Surgeon: Florencio Hunting, MD;  Location: [facility];  Service: Urology;  Laterality: Left;  GENERAL ANESTHESIA WITH PARALYSIS   IR NEPHROSTOMY EXCHANGE LEFT  03/10/2024   IR NEPHROSTOMY PLACEMENT LEFT  03/07/2024   KNEE ARTHROSCOPY Right    LEFT ATRIAL APPENDAGE OCCLUSION N/A 12/13/2023   Procedure: LEFT ATRIAL APPENDAGE OCCLUSION;  Surgeon: Ardeen Kohler, MD;  Location: [facility] INVASIVE CV LAB;  Service: Cardiovascular;   Laterality: N/A;   LEFT HEART CATHETERIZATION WITH CORONARY ANGIOGRAM N/A 02/04/2014   Procedure: LEFT HEART CATHETERIZATION WITH CORONARY ANGIOGRAM;  Surgeon: Wenona Hamilton, MD;  Location: [facility] CATH [facility];  Service: Cardiovascular;  Laterality: N/A;  severe one-vessel CAD, chronically occluded RCA with faint left-to-right collaterals;  aneurysmal LCFx with sluggish coronary flow;  normal LVSF w/ moderately elevated LVEDP (ostialOM2 20%, pOM3 20%, pD1 20%, mCFX 50%, diffuse 20% pCFX)   PROSTATE BIOPSY N/A 08/12/2020   Procedure: BIOPSY TRANSRECTAL ULTRASONIC PROSTATE (TUBP);  Surgeon: Florencio Hunting, MD;  Location: [facility];  Service: Urology;  Laterality: N/A;   ROBOT ASSITED LAPAROSCOPIC NEPHROURETERECTOMY Right 03/22/2020   Procedure: XI ROBOT ASSITED LAPAROSCOPIC NEPHROURETERECTOMY;  Surgeon: Florencio Hunting, MD;  Location: [facility];  Service: Urology;  Laterality: Right;   TRANSESOPHAGEAL ECHOCARDIOGRAM (CATH LAB) N/A 12/13/2023   Procedure: TRANSESOPHAGEAL ECHOCARDIOGRAM;  Surgeon: Ardeen Kohler, MD;  Location: [facility] INVASIVE CV LAB;  Service: Cardiovascular;  Laterality: N/A;   TRANSESOPHAGEAL ECHOCARDIOGRAM (CATH LAB) N/A 02/11/2024   Procedure: TRANSESOPHAGEAL ECHOCARDIOGRAM;  Surgeon: Harrold Lincoln, MD;  Location: [facility] INVASIVE CV LAB;  Service: Cardiovascular;  Laterality: N/A;   TRANSTHORACIC ECHOCARDIOGRAM  11-22-2016   dr hochrein   ef 55-60%/ mild MR and TR/ moderate LAE   TRANSURETHRAL RESECTION OF BLADDER TUMOR N/A 02/10/2021   Procedure: TRANSURETHRAL RESECTION OF BLADDER TUMOR (TURBT)/ CYSTOSCOPY/ LEFT  RETROGRADE;  Surgeon: Florencio Hunting, MD;  Location: [facility];  Service: Urology;  Laterality: N/A;  GENERAL ANESTHESIA WITH PARALYSIS   Family History  Problem Relation Age of Onset   Hypertension Mother    Cancer Mother        anal cancer   Esophageal cancer Neg Hx    Pancreatic cancer Neg Hx    Stomach cancer Neg Hx    Liver disease Neg Hx    Outpatient Medications Prior to  Visit  Medication Sig Dispense Refill   amLODipine  (NORVASC ) 5 MG tablet TAKE 1 TABLET (5 MG TOTAL) BY MOUTH DAILY 90 tablet 3   aspirin  EC 81 MG tablet Take 1 tablet (81 mg total) by mouth daily. Swallow whole. 30 tablet 12   Cholecalciferol  (VITAMIN D3) 1000 units CAPS Take 1,000 Units by mouth daily.     furosemide  (LASIX ) 20 MG tablet Take 1 tablet (20 mg total) by mouth daily. For the next 3 days take 40 mg (2 tabs) daily then resume one tab daily. 96 tablet 3   hydrOXYzine  (VISTARIL ) 25 MG capsule Take 1 capsule (25 mg total) by mouth every 8 (eight) hours as needed. 30 capsule 2   isosorbide  mononitrate (IMDUR ) 30 MG 24 hr tablet TAKE ONE TABLET BY MOUTH DAILY 90 tablet 3   metoprolol  tartrate (LOPRESSOR ) 25 MG tablet TAKE ONE TABLET BY MOUTH TWICE A DAY 180 tablet 2   nitroGLYCERIN  (NITROSTAT ) 0.4 MG SL tablet TAKE ONE TABLET UNDER THE TONGUE EVERY 5 MINS FOR THREE DOSES AS NEEDED FOR CHEST PAIN. CALL 911 IF 2ND DOSE DOESN'T HELP 25 tablet 11   Pembrolizumab  (KEYTRUDA  IV) Inject into the vein every 14 (fourteen) days.     rosuvastatin  (CRESTOR ) 40 MG tablet TAKE 1 TABLET (40 MG TOTAL) BY MOUTH EVERY MORNING. PLEASE KEEP SCHEDULED APPOINTMENT 90 tablet 3   sitaGLIPtin  (JANUVIA ) 25 MG tablet TAKE ONE TABLET BY MOUTH DAILY 90 tablet 3   sodium bicarbonate 650 MG tablet Take 650 mg by mouth 2 (two) times daily.     tamsulosin  (FLOMAX ) 0.4 MG CAPS capsule TAKE ONE CAPSULE BY MOUTH DAILY 30 capsule 11   traZODone  (DESYREL ) 50 MG tablet Take 0.5-1 tablets (25-50 mg total) by mouth at bedtime as needed for sleep. 30 tablet 3   olmesartan  (BENICAR ) 40 MG tablet Take 40 mg by mouth daily.     No facility-administered medications prior to visit.   Allergies  Allergen Reactions   Xarelto  [Rivaroxaban ] Other (See Comments)    Skin Blisters      Objective:   Today's Vitals   04/18/24 1255  BP: 124/72  Pulse: 85  Temp: 97.8 F (36.6 C)  TempSrc: Temporal  SpO2: 96%  Weight: 209 lb 3.2 oz  (94.9 kg)  Height: 5\' 11"  (1.803 m)   Body mass index is 29.18 kg/m.   General: Well developed, well nourished. No acute distress. Extremities: 3-4+ edema of the lower legs. . Skin: Warm and dry. No rashes. Neuro: CN II-XII intact. Normal sensation and DTR bilaterally. Psych: Alert and oriented. Normal mood and affect.  Health Maintenance Due  Topic Date Due   Zoster Vaccines- Shingrix (1 of 2) Never done   OPHTHALMOLOGY EXAM  05/18/2022   Diabetic kidney evaluation - Urine ACR  05/03/2024     Lab Results:    Latest Ref Rng & Units 04/01/2024   11:38 AM 03/11/2024    5:39 AM 03/10/2024    5:35 AM  CBC  WBC 4.0 -  10.5 K/uL 11.2   11.8   Hemoglobin 13.0 - 17.0 g/dL 9.6  8.9  8.3   Hematocrit 39.0 - 52.0 % 30.7  28.9  26.6   Platelets 150.0 - 400.0 K/uL 342.0   147     Assessment & Plan:   Problem List Items Addressed This Visit       Cardiovascular and Mediastinum   Chronic diastolic CHF (congestive heart failure), NYHA class 2 (HCC)   Adam Harris continues to show signs that he is fluid overloaded. Continue metoprolol  tartrate 25 mg twice daily and olmesartan  40 mg daily. Also continue furosemide  20 mg two tablets bid. I will add metolazone 2.5 mg daily for 7 days. I recommend he try and wear his compression stockings and elevate his legs during the day.      Relevant Medications   metolazone (ZAROXOLYN) 2.5 MG tablet   olmesartan  (BENICAR ) 40 MG tablet   Essential hypertension   Blood pressure is in good control. Continue metoprolol  tartrate 25 mg twice daily, amlodipine  5 mg daily, olmesartan  40 mg daily.      Relevant Medications   metolazone (ZAROXOLYN) 2.5 MG tablet   olmesartan  (BENICAR ) 40 MG tablet     Other   Acute blood loss anemia   I suspect his anemia coupled with his CHF are contributing to his generalized fatigue. His anemia was recovering 2 weeks ago, but is likely not normalized yet. I recommend he continue his iron and we check this again in about a  month.      Other Visit Diagnoses       Other fatigue    -  Primary   As noted above, likely due to [facility] and CHF.     Lower leg edema       As noted above. We will try adding metolazone.   Relevant Medications   metolazone (ZAROXOLYN) 2.5 MG tablet      Return in about 4 weeks (around 05/16/2024) for Reassessment.   Graig Lawyer, MD

## 2024-04-18 NOTE — Assessment & Plan Note (Signed)
Blood pressure is in good control. Continue metoprolol tartrate 25 mg twice daily, amlodipine 5 mg daily, olmesartan 40 mg daily.

## 2024-04-18 NOTE — Telephone Encounter (Signed)
 Copied from CRM [id number]. Topic: Clinical - Medication Refill >> Apr 18, 2024  2:45 PM Tanazia G wrote: Medication: olmesartan  (BENICAR ) 40 MG tablet metolazone (ZAROXOLYN) 2.5 MG tablet  Has the patient contacted their pharmacy? Yes (Agent: If no, request that the patient contact the pharmacy for the refill. If patient does not wish to contact the pharmacy document the reason why and proceed with request.) (Agent: If yes, when and what did the pharmacy advise?)  This is the patient's preferred pharmacy:   [id number] - [place], [place] - [address] AT [address] & [address] [place] [address] Phone: [phone] Fax: [phone]  Is this the correct pharmacy for this prescription? Yes If no, delete pharmacy and type the correct one.   Has the prescription been filled recently? Yes  Is the patient out of the medication? Yes  Has the patient been seen for an appointment in the last year OR does the patient have an upcoming appointment? Yes  Can we respond through MyChart? Yes  Agent: Please be advised that Rx refills may take up to 3 business days. We ask that you follow-up with your pharmacy.

## 2024-04-18 NOTE — Assessment & Plan Note (Signed)
 I suspect his anemia coupled with his CHF are contributing to his generalized fatigue. His anemia was recovering 2 weeks ago, but is likely not normalized yet. I recommend he continue his iron and we check this again in about a month.

## 2024-04-18 NOTE — Assessment & Plan Note (Signed)
 Mr. Koskela continues to show signs that he is fluid overloaded. Continue metoprolol  tartrate 25 mg twice daily and olmesartan  40 mg daily. Also continue furosemide  20 mg two tablets bid. I will add metolazone 2.5 mg daily for 7 days. I recommend he try and wear his compression stockings and elevate his legs during the day.

## 2024-04-18 NOTE — Telephone Encounter (Signed)
 Rx resent to [facility]  Patient notified VIA phone. Dm/cma

## 2024-04-18 NOTE — Telephone Encounter (Signed)
 Pt states he does not know where his medications were sent. Per chart review, metolazone and olmesartan  were sent to [facility] today. RN called [facility]. [facility] states olmesartan  was refilled 5/29 and it is too soon for it to be refilled. [facility] states olmesartan  was sent to a different pharmacy. [facility] states metolazone is not in stock and had to be ordered. Pt's other pharmacy on file is [facility]  RN called [facility] They said pt's olmesartan  was sent to him 6/2.   RN called the pt. Pt states he never received olmesartan  and does not have it. Pt states [facility] told him it was sent to him, but he claims it wasn't.  RN asked the pt to confirm which is his preferred pharmacy. Pt states he prefers [facility]. RN advised the pt medications were sent to [facility] today, pt states that is not his preferred pharmacy.

## 2024-04-25 ENCOUNTER — Ambulatory Visit: Admitting: Family Medicine

## 2024-04-28 ENCOUNTER — Other Ambulatory Visit (HOSPITAL_COMMUNITY): Payer: Self-pay | Admitting: Interventional Radiology

## 2024-04-28 ENCOUNTER — Ambulatory Visit (HOSPITAL_COMMUNITY)
Admission: RE | Admit: 2024-04-28 | Discharge: 2024-04-28 | Disposition: A | Discharge status: Home / Self Care | Source: Ambulatory Visit | Attending: Radiology | Admitting: Radiology

## 2024-04-28 DIAGNOSIS — N1330 Unspecified hydronephrosis: Secondary | ICD-10-CM | POA: Diagnosis not present

## 2024-04-28 DIAGNOSIS — Z436 Encounter for attention to other artificial openings of urinary tract: Secondary | ICD-10-CM | POA: Diagnosis not present

## 2024-04-28 MED ORDER — LIDOCAINE HCL 1 % IJ SOLN
INTRAMUSCULAR | Status: AC
Start: 2024-04-28 — End: 2024-04-28
  Filled 2024-04-28: qty 20

## 2024-04-28 MED ORDER — IOHEXOL 300 MG/ML  SOLN
50.0000 mL | Freq: Once | INTRAMUSCULAR | Status: AC | PRN
  Administered 2024-04-28: 15 mL

## 2024-04-28 MED ORDER — LIDOCAINE HCL 1 % IJ SOLN
20.0000 mL | Freq: Once | INTRAMUSCULAR | Status: AC
  Administered 2024-04-28: 10 mL via INTRADERMAL

## 2024-04-30 ENCOUNTER — Ambulatory Visit: Payer: Self-pay

## 2024-04-30 NOTE — Telephone Encounter (Addendum)
 FYI Only or Action Required?: Action required by provider  Patient was last seen in primary care on 04/18/2024 by Graig Lawyer, MD. Called Nurse Triage reporting Weight Loss. Symptoms began several weeks ago. Interventions attempted: Nothing. Symptoms are: gradually worsening.  Triage Disposition: See Physician Within 24 Hours  Patient/caregiver understands and will follow disposition?: Unsure  Copied from CRM [id number]. Topic: Clinical - Red Word Triage >> Apr 30, 2024  2:37 PM Trula Gable C wrote: [facility] that prompted transfer to Nurse Triage: Patient wife called in regarding patient had a cancer procedure done and since he had that has been dealing with nausea, weak, in pain, wont eat .  like to know if there was anything that has tylenol  that he can be presribed Reason for Disposition  MODERATE fatigue or weakness (i.e., interferes with work, school, normal activities)  Answer Assessment - Initial Assessment Questions 1. CONCERN: What happened that made you call today?     Pt is weak, isn't eating,  2. ONSET: When did the loss of appetite begin? (e.g., minutes, hours or days ago)     At least a month 3. UNDERWEIGHT: Are you losing weight? Is the weight loss intentional?     yes 5. FOOD INTAKE: Do you worry about what you eat? How much you eat?     Some solid food intake 7. CURRENT WEIGHT: How much do you weigh?       unsure 9. BMI: What is your BMI (body mass index)? Note: The triager can use the BMI calculator on the CDC website at [employer].    - Underweight: Below 18.5   - Normal, healthy weight: 18.5 - 24.9   - Overweight: 25.0 - 29.9   - Obese: 30 or above     No weight at this time 10. MEDICINES: Do you routinely use medicines (laxatives, diuretics, enemas) to help control your weight?       denies 11. OTHER SYMPTOMS: What symptoms are you currently experiencing? (e.g., none; abnormal heart rate, blackout  spells, shakiness, suicidal thoughts, vomiting)       Nausea  Caller is wife of pt. She would like something with tylenol  and also something for the nausea. RN advised caller that call should be placed to oncologist as well. Caller states that he has appt tomorrow with onc.  Protocols used: Eating Disorders Symptoms and Questions-A-AH

## 2024-05-01 ENCOUNTER — Inpatient Hospital Stay: Attending: Hematology

## 2024-05-01 ENCOUNTER — Inpatient Hospital Stay (HOSPITAL_BASED_OUTPATIENT_CLINIC_OR_DEPARTMENT_OTHER): Admitting: Nurse Practitioner

## 2024-05-01 ENCOUNTER — Inpatient Hospital Stay

## 2024-05-01 VITALS — BP 116/69 | HR 91 | Temp 97.7°F | Resp 16 | Ht 71.0 in | Wt 203.0 lb

## 2024-05-01 VITALS — BP 125/81 | HR 98 | Resp 18

## 2024-05-01 DIAGNOSIS — C679 Malignant neoplasm of bladder, unspecified: Secondary | ICD-10-CM | POA: Insufficient documentation

## 2024-05-01 DIAGNOSIS — R110 Nausea: Secondary | ICD-10-CM | POA: Diagnosis not present

## 2024-05-01 DIAGNOSIS — C689 Malignant neoplasm of urinary organ, unspecified: Secondary | ICD-10-CM

## 2024-05-01 DIAGNOSIS — E860 Dehydration: Secondary | ICD-10-CM | POA: Diagnosis not present

## 2024-05-01 LAB — CMP (CANCER CENTER ONLY)
ALT: 9 U/L (ref 0–44)
AST: 13 U/L — ABNORMAL LOW (ref 15–41)
Albumin: 3.4 g/dL — ABNORMAL LOW (ref 3.5–5.0)
Alkaline Phosphatase: 126 U/L (ref 38–126)
Anion gap: 8 (ref 5–15)
BUN: 45 mg/dL — ABNORMAL HIGH (ref 8–23)
CO2: 27 mmol/L (ref 22–32)
Calcium: 9.2 mg/dL (ref 8.9–10.3)
Chloride: 98 mmol/L (ref 98–111)
Creatinine: 2.01 mg/dL — ABNORMAL HIGH (ref 0.61–1.24)
GFR, Estimated: 34 mL/min — ABNORMAL LOW (ref >60)
Glucose, Bld: 180 mg/dL — ABNORMAL HIGH (ref 70–99)
Potassium: 3.7 mmol/L (ref 3.5–5.1)
Sodium: 133 mmol/L — ABNORMAL LOW (ref 135–145)
Total Bilirubin: 1.6 mg/dL — ABNORMAL HIGH (ref 0.0–1.2)
Total Protein: 7.9 g/dL (ref 6.5–8.1)

## 2024-05-01 LAB — CBC WITH DIFFERENTIAL (CANCER CENTER ONLY)
Abs Immature Granulocytes: 0.04 10*3/uL (ref 0.00–0.07)
Basophils Absolute: 0.1 10*3/uL (ref 0.0–0.1)
Basophils Relative: 1 %
Eosinophils Absolute: 0.1 10*3/uL (ref 0.0–0.5)
Eosinophils Relative: 1 %
HCT: 35.4 % — ABNORMAL LOW (ref 39.0–52.0)
Hemoglobin: 11.5 g/dL — ABNORMAL LOW (ref 13.0–17.0)
Immature Granulocytes: 0 %
Lymphocytes Relative: 8 %
Lymphs Abs: 0.9 10*3/uL (ref 0.7–4.0)
MCH: 24 pg — ABNORMAL LOW (ref 26.0–34.0)
MCHC: 32.5 g/dL (ref 30.0–36.0)
MCV: 73.9 fL — ABNORMAL LOW (ref 80.0–100.0)
Monocytes Absolute: 1.6 10*3/uL — ABNORMAL HIGH (ref 0.1–1.0)
Monocytes Relative: 14 %
Neutro Abs: 8.8 10*3/uL — ABNORMAL HIGH (ref 1.7–7.7)
Neutrophils Relative %: 76 %
Platelet Count: 246 10*3/uL (ref 150–400)
RBC: 4.79 MIL/uL (ref 4.22–5.81)
RDW: 18.6 % — ABNORMAL HIGH (ref 11.5–15.5)
WBC Count: 11.6 10*3/uL — ABNORMAL HIGH (ref 4.0–10.5)
nRBC: 0 % (ref 0.0–0.2)

## 2024-05-01 MED ORDER — ONDANSETRON HCL 4 MG/2ML IJ SOLN
8.0000 mg | Freq: Once | INTRAMUSCULAR | Status: AC
  Administered 2024-05-01: 8 mg via INTRAVENOUS
  Filled 2024-05-01: qty 4

## 2024-05-01 MED ORDER — ONDANSETRON HCL 8 MG PO TABS: 8.0000 mg | ORAL_TABLET | Freq: Three times a day (TID) | ORAL | 0 refills | Status: DC | PRN

## 2024-05-01 MED ORDER — SODIUM CHLORIDE 0.9 % IV SOLN: INTRAVENOUS | Status: DC

## 2024-05-01 MED ORDER — ACETAMINOPHEN-CODEINE 300-30 MG PO TABS: 1.0000 | ORAL_TABLET | Freq: Three times a day (TID) | ORAL | 0 refills | Status: DC | PRN

## 2024-05-01 NOTE — Patient Instructions (Signed)
 Plan: Bowel regimen to fix constipation (stool softener and/or miralax , magnesium  citrate drink for clear out) Referral for home physical therapy to get stronger Prescriptions for tylenol  #3 (codeine) and zofran  for nausea I will discuss plan with Dr. Maryalice Smaller when she returns next week

## 2024-05-01 NOTE — Progress Notes (Signed)
 [facility]   [phone] Fax:([phone]) [phone]    Patient Care Team: Adam Garnette HERO, MD as PCP - General (Family Medicine) Adam Agent, MD as PCP - Cardiology (Cardiology) Adam Chew, MD as PCP - Electrophysiology (Cardiology) Adam Glance, MD as Consulting Physician (Urology) Adam Agent, MD as Consulting Physician (Cardiology) Adam Katheryn BROCKS, MD as Consulting Physician (Nephrology) Adam Callander, MD as Consulting Physician (Hematology and Oncology) [facility]  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 Mild degradation secondary to lack of IV contrast and motion. 2. Similar to decrease in mild right-sided bladder wall thickening, nonspecific in the setting of prior bladder cancer. Most likely treatment related. 3. Borderline left external iliac and right inguinal adenopathy, decreased. This could represent response to therapy of metastasis or be reactive. 4. No evidence of abdominal metastasis or recurrent disease, status post right nephrectomy.   03/22/2023 Imaging   MR abdomen without contrast  IMPRESSION: 1. Mild degradation secondary to lack of IV contrast and motion. 2. Similar to decrease in mild right-sided bladder wall thickening, nonspecific in the setting of prior bladder cancer. Most likely treatment related. 3. Borderline left external iliac and right  inguinal adenopathy, decreased. This could represent response to therapy of metastasis or be reactive. 4. No evidence of abdominal metastasis or recurrent disease, status post right nephrectomy.     06/08/2023 Cancer Staging   Staging form: Urinary Bladder, AJCC 8th Edition - Clinical stage from 06/08/2023: Stage 0is (cTis, cN0, cM0) - Signed by Adam Callander, MD on 06/09/2023 WHO/ISUP grade (low/high): High Grade Histologic grading system: 2 grade system   07/31/2023 Imaging   CT chest without contrast   IMPRESSION: No findings suspicious for pneumonitis.  No evidence of metastatic disease.  Aortic Atherosclerosis (ICD10-I70.0).   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 Last seen by Dr. Lanny 4/10. Cystoscopy 03/06/24 was negative for recurrence but was hospitalized for hemorrhage, discharged 5/1. He has had nephrostomy exchange 6/16, urine appears normal. He has pain at the left flank nephrostomy site. Has had similar pain with previous exchanges but doesn't usually last this long. Overall he does not feel well, comes in a wheelchair today which is unlike him. Occasionally feels dazed but not dizzy. Legs feel weak but no fall. He is constipated, takes stool softener PRN. Had n/v x1 from poor diet choice. Has not felt like eating much and lost some weight. Denies fever/chills, cough, chest pain, dyspnea, leg swelling. Felt well prior to cystoscopy but has not recovered from hospitalization.  ROS  All other systems reviewed and negative  Past Medical History:  Diagnosis Date   Abnormal radiologic findings on diagnostic imaging of renal pelvis, ureter,  or bladder    bilateral ureter abnormalities   Anticoagulant long-term use    eliquis    Anxiety    pt denies   Arthritis    Atrial fibrillation, chronic (HCC)    CAD (coronary artery disease) cardiologist-- dr hochrein   NSTEMI 02-04-2014  per cardiac cath chronic occluded RCA w/ faint left-to-right collaterals and aneurysmal LCFx with sluggish coronary flow/   NSTEMI --11-21-2016 per cardiac cath occluded proximal RCA & mid to diastal CFX 100%, med rx. If that does not work, PTCA or CABG   Cancer [facility])    bladder and kidney   CHF (congestive heart failure) (HCC)    CKD (chronic kidney disease), stage III (HCC)    patient unaware   DOE (dyspnea on exertion)    Fatty liver    pt denies   Hematuria 02/2019   History of COVID-19 10/2019   History of non-ST elevation myocardial infarction (NSTEMI)    02-04-2014  and 11-21-2016  cardiac cath done both times ,  medically management   History of shingles 12/2017   slight pain and numbness still noted in the area   Hyperlipidemia    Hypertension    Insomnia    Myocardial infarction [facility]) 2015   Persistent atrial fibrillation [facility])    cardiologsit-- dr hochrein   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 Kennyth   Thoracic aortic atherosclerosis (HCC)    Type 2 diabetes mellitus (HCC)    Urinary frequency      Past Surgical History:  Procedure Laterality Date   CARDIAC CATHETERIZATION N/A 11/21/2016   Procedure: Left Heart Cath and Coronary Angiography;  Surgeon: Debby DELENA Sor, MD;  Location: [facility] INVASIVE CV LAB;  Service: Cardiovascular;  Laterality: N/A;  pRCA 100% , ostial LAD 45%, OM3 80%, mCFX to dCFX 100% (AV groove), lateral OM3 50%   COLONOSCOPY     CYSTOSCOPY WITH BIOPSY N/A 08/12/2020   Procedure: CYSTOSCOPY WITH BLADDER BIOPSY AND TRANSURETHRAL RESECTION OF BLADDER TUMOR;  Surgeon: Adam Glance, MD;  Location: [facility] ORS;  Service: Urology;  Laterality: N/A;    CYSTOSCOPY WITH BIOPSY N/A 11/10/2021   Procedure: CYSTOSCOPY WITH BLADDER BIOPSIES/ LEFT RETROGRADE;  Surgeon: Adam Glance, MD;  Location: [facility];  Service: Urology;  Laterality: N/A;   CYSTOSCOPY WITH BIOPSY Left 07/27/2022   Procedure: CYSTOSCOPY WITH  BLADDER BIOPSY, TRANSURETHRAL FULGERATION OF BLADDER, EXAM UNDER ANESTHESIA, LEFT RETROGRADE PYELOGRAM;  Surgeon: Adam Glance, MD;  Location: [facility];  Service: Urology;  Laterality: Left;   CYSTOSCOPY WITH BIOPSY N/A 04/23/2023   Procedure: CYSTOSCOPY WITH BLADDER AND PROSTATIC URETHRAL BIOPSIES;  Surgeon: Adam Glance, MD;  Location: [facility];  Service: Urology;  Laterality: N/A;  60 MINUTES NEEDED FOR CASE   CYSTOSCOPY WITH BIOPSY N/A 03/06/2024   Procedure: CYSTOSCOPY, WITH BIOPSY;  Surgeon: Adam Glance, MD;  Location: [facility];  Service: Urology;  Laterality: N/A;  CYSTOSCOPY WITH BLADDER BIOPSIES   CYSTOSCOPY WITH FULGERATION N/A 03/06/2024   Procedure: CYSTOSCOPY, WITH BLADDER FULGURATION, TURBT;  Surgeon: Adam Glance, MD;  Location: [facility];  Service: Urology;  Laterality: N/A;   CYSTOSCOPY WITH URETEROSCOPY AND STENT PLACEMENT Right 12/15/2019   Procedure: CYSTOSCOPY WITH RIGHT RETROGRADE/ RIGHT URETEROSCOPY/ BIOPSY;  Surgeon: Ottelin, Mark, MD;  Location: [facility];  Service: Urology;  Laterality: Right;   CYSTOSCOPY/RETROGRADE/URETEROSCOPY Bilateral 03/18/2018   Procedure: CYSTOSCOPY/RETROGRADE/URETEROSCOPY.;  Surgeon: Ottelin, Mark, MD;  Location: [facility];  Service: Urology;  Laterality: Bilateral;   EYE SURGERY Bilateral    cateract in January 2023   HYDROCELE EXCISION Left 07/27/2022   Procedure: HYDROCELE REPAIR;  Surgeon: Adam Glance, MD;  Location: [facility];  Service: Urology;  Laterality: Left;  GENERAL ANESTHESIA WITH PARALYSIS   IR NEPHROSTOMY EXCHANGE LEFT  03/10/2024   IR NEPHROSTOMY EXCHANGE LEFT  04/28/2024   IR NEPHROSTOMY PLACEMENT LEFT  03/07/2024   KNEE ARTHROSCOPY Right    LEFT  ATRIAL APPENDAGE OCCLUSION N/A 12/13/2023   Procedure: LEFT ATRIAL APPENDAGE OCCLUSION;  Surgeon: Adam Chew, MD;  Location: [facility] INVASIVE CV LAB;  Service: Cardiovascular;  Laterality: N/A;   LEFT HEART CATHETERIZATION WITH CORONARY ANGIOGRAM N/A 02/04/2014   Procedure: LEFT HEART CATHETERIZATION WITH CORONARY ANGIOGRAM;  Surgeon: Deatrice DELENA Cage, MD;  Location: [facility] CATH LAB;  Service: Cardiovascular;  Laterality: N/A;  severe one-vessel CAD, chronically occluded RCA with faint left-to-right collaterals;  aneurysmal LCFx with sluggish coronary flow;  normal LVSF w/ moderately elevated LVEDP (ostialOM2 20%, pOM3 20%, pD1 20%, mCFX 50%, diffuse 20% pCFX)   PROSTATE BIOPSY N/A 08/12/2020   Procedure: BIOPSY TRANSRECTAL ULTRASONIC PROSTATE (TUBP);  Surgeon: Adam Glance, MD;  Location: [facility];  Service: Urology;  Laterality: N/A;   ROBOT ASSITED LAPAROSCOPIC NEPHROURETERECTOMY Right 03/22/2020   Procedure: XI ROBOT ASSITED LAPAROSCOPIC NEPHROURETERECTOMY;  Surgeon: Adam Glance, MD;  Location: [facility];  Service: Urology;  Laterality: Right;   TRANSESOPHAGEAL ECHOCARDIOGRAM (CATH LAB) N/A 12/13/2023   Procedure: TRANSESOPHAGEAL ECHOCARDIOGRAM;  Surgeon: Adam Chew, MD;  Location: [facility] INVASIVE CV LAB;  Service: Cardiovascular;  Laterality: N/A;   TRANSESOPHAGEAL ECHOCARDIOGRAM (CATH LAB) N/A 02/11/2024   Procedure: TRANSESOPHAGEAL ECHOCARDIOGRAM;  Surgeon: Barbaraann Darryle Ned, MD;  Location: [facility] INVASIVE CV LAB;  Service: Cardiovascular;  Laterality: N/A;   TRANSTHORACIC ECHOCARDIOGRAM  11-22-2016   dr hochrein   ef 55-60%/ mild MR and TR/ moderate LAE   TRANSURETHRAL RESECTION OF BLADDER TUMOR N/A 02/10/2021   Procedure: TRANSURETHRAL RESECTION OF BLADDER TUMOR (TURBT)/ CYSTOSCOPY/ LEFT RETROGRADE;  Surgeon: Adam Glance, MD;  Location: [facility];  Service: Urology;  Laterality: N/A;  GENERAL ANESTHESIA WITH PARALYSIS     Outpatient Encounter Medications as of 05/01/2024  Medication Sig    acetaminophen -codeine  (TYLENOL  #3) 300-30 MG tablet Take 1 tablet by mouth every 8 (eight) hours as needed for moderate pain (pain score 4-6).   ondansetron  (ZOFRAN ) 8 MG tablet Take 1 tablet (8 mg total) by mouth every 8 (eight) hours as needed for nausea or vomiting.   amLODipine  (NORVASC ) 5 MG tablet TAKE 1 TABLET (5 MG TOTAL) BY MOUTH DAILY   aspirin  EC 81 MG tablet Take 1 tablet (81 mg total) by mouth daily. Swallow whole.   Cholecalciferol  (VITAMIN D3) 1000 units CAPS Take 1,000 Units by mouth daily.   furosemide  (LASIX ) 20 MG tablet Take 1 tablet (20 mg total) by mouth daily. For the next 3 days take 40 mg (2 tabs) daily then resume one tab daily.   hydrOXYzine  (VISTARIL ) 25 MG capsule Take 1 capsule (25 mg total) by mouth every 8 (eight) hours as needed.   isosorbide  mononitrate (IMDUR ) 30 MG 24 hr tablet TAKE ONE TABLET BY MOUTH DAILY   metolazone  (ZAROXOLYN ) 2.5 MG tablet Take 1 tablet (2.5 mg total) by mouth daily.   metoprolol  tartrate (LOPRESSOR ) 25 MG tablet TAKE ONE TABLET BY  MOUTH TWICE A DAY   nitroGLYCERIN  (NITROSTAT ) 0.4 MG SL tablet TAKE ONE TABLET UNDER THE TONGUE EVERY 5 MINS FOR THREE DOSES AS NEEDED FOR CHEST PAIN. CALL 911 IF 2ND DOSE DOESN'T HELP   olmesartan  (BENICAR ) 40 MG tablet Take 1 tablet (40 mg total) by mouth daily.   Pembrolizumab  (KEYTRUDA  IV) Inject into the vein every 14 (fourteen) days.   rosuvastatin  (CRESTOR ) 40 MG tablet TAKE 1 TABLET (40 MG TOTAL) BY MOUTH EVERY MORNING. PLEASE KEEP SCHEDULED APPOINTMENT   sitaGLIPtin  (JANUVIA ) 25 MG tablet TAKE ONE TABLET BY MOUTH DAILY   sodium bicarbonate 650 MG tablet Take 650 mg by mouth 2 (two) times daily.   tamsulosin  (FLOMAX ) 0.4 MG CAPS capsule TAKE ONE CAPSULE BY MOUTH DAILY   traZODone  (DESYREL ) 50 MG tablet Take 0.5-1 tablets (25-50 mg total) by mouth at bedtime as needed for sleep.   No facility-administered encounter medications on file as of 05/01/2024.     Today's Vitals   05/01/24 1327 05/01/24 1330   BP: 116/69   Pulse: 91   Resp: 16   Temp: 97.7 F (36.5 C)   TempSrc: Temporal   SpO2: 99%   Weight: 203 lb (92.1 kg)   Height: 5' 11 (1.803 m)   PainSc:  6    Body mass index is 28.31 kg/m.   ECOG PERFORMANCE STATUS: 2-3  PHYSICAL EXAM GENERAL:alert, no distress and comfortable SKIN: no rash  EYES: sclera clear NECK: without mass LYMPH:  no palpable cervical or supraclavicular lymphadenopathy  LUNGS: clear with normal breathing effort HEART: regular rate & rhythm, no lower extremity edema ABDOMEN: abdomen soft, non-tender and normal bowel sounds. Left nephrostomy dressing c/d/I, reinforced NEURO: alert & oriented x 3 with fluent speech, generalized weakness   CBC    Latest Ref Rng & Units 05/01/2024   12:40 PM 04/01/2024   11:38 AM 03/11/2024    5:39 AM  CBC  WBC 4.0 - 10.5 K/uL 11.6  11.2    Hemoglobin 13.0 - 17.0 g/dL 88.4  9.6  8.9   Hematocrit 39.0 - 52.0 % 35.4  30.7  28.9   Platelets 150 - 400 K/uL 246  342.0        CMP     Latest Ref Rng & Units 05/01/2024   12:40 PM 04/01/2024   11:38 AM 03/13/2024    6:37 AM  CMP  Glucose 70 - 99 mg/dL 819  857  858   BUN 8 - 23 mg/dL 45  38  40   Creatinine 0.61 - 1.24 mg/dL 7.98  7.98  7.77   Sodium 135 - 145 mmol/L 133  135  135   Potassium 3.5 - 5.1 mmol/L 3.7  4.5  4.0   Chloride 98 - 111 mmol/L 98  101  107   CO2 22 - 32 mmol/L 27  24  19    Calcium  8.9 - 10.3 mg/dL 9.2  9.2  8.1   Total Protein 6.5 - 8.1 g/dL 7.9     Total Bilirubin 0.0 - 1.2 mg/dL 1.6     Alkaline Phos 38 - 126 U/L 126     AST 15 - 41 U/L 13     ALT 0 - 44 U/L 9         ASSESSMENT & PLAN: 78 yo male   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  -He has had multiple recurrence of Ta and Tis urothelial carcinoma in bladder since then, with the most  recent one in June 2024, also involve the urethra of prostate.  He has had multiple BCG maintenance therapy, is BCG refractory. -Given his BCG refractory disease, recurrence in bladder and the urethra, Dr. Lanny recommended systemic therapy with Keytruda  every 3 weeks (or every 6 weeks if he tolerates well) for 2 years, per NCCN guideline.  He had no history of autoimmune disease, overall in good health, there is no contraindication for immunotherapy. -Began on 06/15/2023. C4 was postponed due to dyspnea and hypoxia on exertion, CT chest was negative for pneumonitis  -repeated cystoscopy and cytology was still positive in early Nov 2024. Per discussion with Dr. Renda he and Dr. Lanny decided to continue Keytruda  for additional 3 months and re-evaluate  -Most recent cystoscopy 03/06/24 negative for carcinoma but unfortunately he hemorrhaged and required nephrostomy tube and hospital admission through 5/1. Keytruda  has been on hold  -While he seems to be tolerating Keytruda , he appears deconditioned with weakness and weight loss. He has not recovered from hospitalization.  -Discussed symptom management and how to improve his overall condition. I recommend home PT, he agrees.  - Will send in tylenol  #3 for left flank pain at the nephrostomy site. I recommend aggressive bowel regimen, can increase stool softener and laxative to BID or try mag citrate. I suspect his nausea is r/t constipation and I also sent in zofran  PRN.  -Labs reviewed. 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 Scr 2.01, was <2 prior to admission. Will hydrate today with IVF -Will discuss his case with Dr. Lanny upon her return and plan to follow up in 3 weeks, or sooner if needed.    PLAN: -Cystoscopy path (negative), hospital course, and today's labs reviewed -Continue holding Keytruda  -IVF and Zofran  today in clinic -Referral to home PT, symptom management for constipation and nausea -Rx: Tylenol  #3, zofran  -Discuss case with Dr. Lanny  upon her return, plan to follow up in 3 weeks, or sooner if needed  Orders Placed This Encounter  Procedures   Ambulatory referral to Home Health    Referral Priority:   Routine    Referral Type:   Home Health Care    Referral Reason:   Specialty Services Required    Requested Specialty:   Home Health Services    Number of Visits Requested:   1      All questions were answered. The patient knows to call the clinic with any problems, questions or concerns. No barriers to learning were detected. I spent 30 minutes counseling the patient face to face. The total time spent in the appointment was 40 minutes and more than 50% was on counseling, review of test results, and coordination of care.   Adam Hollman K Jarrin Staley, NP 05/02/2024

## 2024-05-01 NOTE — Patient Instructions (Signed)

## 2024-05-02 ENCOUNTER — Telehealth: Payer: Self-pay | Admitting: Hematology

## 2024-05-02 ENCOUNTER — Encounter: Payer: Self-pay | Admitting: Nurse Practitioner

## 2024-05-02 ENCOUNTER — Encounter: Payer: Self-pay | Admitting: Hematology

## 2024-05-02 NOTE — Telephone Encounter (Signed)
 Scheduled appointments per 6/19 los. Called and left a VM with appointment details for the patient.

## 2024-05-02 NOTE — Telephone Encounter (Signed)
 Patient seen his oncologist and they gave him something for nausea and pain.   He is feeling some better. Dm/cma

## 2024-05-05 ENCOUNTER — Telehealth: Payer: Self-pay

## 2024-05-05 NOTE — Telephone Encounter (Addendum)
 Faxed referral, demo/insurance, recent visit note, and patient's med list to [facility] T# [phone], F# [phone]/ F# [phone]. Confirmation received.   ----- Message from Lacie K Burton sent at 05/02/2024  9:34 AM EDT ----- Baruch, I placed a referral for home PT, could you please help set it up. The details should be in the order but let me know if you have questions.  Thanks Lacie

## 2024-05-06 ENCOUNTER — Other Ambulatory Visit: Payer: Self-pay

## 2024-05-06 DIAGNOSIS — M1990 Unspecified osteoarthritis, unspecified site: Secondary | ICD-10-CM | POA: Diagnosis not present

## 2024-05-06 DIAGNOSIS — F419 Anxiety disorder, unspecified: Secondary | ICD-10-CM | POA: Diagnosis not present

## 2024-05-06 DIAGNOSIS — I5032 Chronic diastolic (congestive) heart failure: Secondary | ICD-10-CM

## 2024-05-06 DIAGNOSIS — I130 Hypertensive heart and chronic kidney disease with heart failure and stage 1 through stage 4 chronic kidney disease, or unspecified chronic kidney disease: Secondary | ICD-10-CM | POA: Diagnosis not present

## 2024-05-06 DIAGNOSIS — I4819 Other persistent atrial fibrillation: Secondary | ICD-10-CM | POA: Diagnosis not present

## 2024-05-06 DIAGNOSIS — E1122 Type 2 diabetes mellitus with diabetic chronic kidney disease: Secondary | ICD-10-CM | POA: Diagnosis not present

## 2024-05-06 DIAGNOSIS — I700 Atherosclerosis of aorta: Secondary | ICD-10-CM | POA: Diagnosis not present

## 2024-05-06 DIAGNOSIS — C679 Malignant neoplasm of bladder, unspecified: Secondary | ICD-10-CM | POA: Diagnosis not present

## 2024-05-06 DIAGNOSIS — I509 Heart failure, unspecified: Secondary | ICD-10-CM | POA: Diagnosis not present

## 2024-05-06 DIAGNOSIS — R600 Localized edema: Secondary | ICD-10-CM

## 2024-05-06 DIAGNOSIS — N183 Chronic kidney disease, stage 3 unspecified: Secondary | ICD-10-CM | POA: Diagnosis not present

## 2024-05-06 MED ORDER — METOLAZONE 2.5 MG PO TABS
2.5000 mg | ORAL_TABLET | Freq: Every day | ORAL | 0 refills | Status: DC
Start: 2024-05-06 — End: 2024-06-03

## 2024-05-06 NOTE — Telephone Encounter (Signed)
 Received a refill request for  Metolazone  2.5 mg LR 04/18/24 LOV  04/18/24 FOV 05/19/24  Please review and advise.  Thanks. Dm/cma

## 2024-05-07 ENCOUNTER — Telehealth: Payer: Self-pay

## 2024-05-07 NOTE — Telephone Encounter (Signed)
 Called and spoke to Adam Harris regarding the Lasix  and Plavix .  Patient has a bottle of 40 mg Lasix  at home (unsure of when prescribed), and plavix  is not longer on medication list.   He will verify once again his medications and then call Cardiologist regarding both meds. Dm/cma

## 2024-05-07 NOTE — Telephone Encounter (Signed)
 Copied from CRM [id number]. Topic: General - Other >> May 07, 2024 11:06 AM Henretta I wrote: Reason for CRM: Mr. Ermalene was calling to notify that patient was seen for homehealth, orders of oncologists dr. Lanny and they would like to relay some medication discrepancies, Patient reported he is taking furosemide  (LASIX ) 40 MG tablet as needed and also he is taking plavix  75mg  1 tablet a day. If any additional questions Mr. Ermalene can be reached at [phone]

## 2024-05-08 ENCOUNTER — Telehealth: Payer: Self-pay | Admitting: Cardiology

## 2024-05-08 DIAGNOSIS — F419 Anxiety disorder, unspecified: Secondary | ICD-10-CM | POA: Diagnosis not present

## 2024-05-08 DIAGNOSIS — I130 Hypertensive heart and chronic kidney disease with heart failure and stage 1 through stage 4 chronic kidney disease, or unspecified chronic kidney disease: Secondary | ICD-10-CM | POA: Diagnosis not present

## 2024-05-08 DIAGNOSIS — M1990 Unspecified osteoarthritis, unspecified site: Secondary | ICD-10-CM | POA: Diagnosis not present

## 2024-05-08 DIAGNOSIS — E1122 Type 2 diabetes mellitus with diabetic chronic kidney disease: Secondary | ICD-10-CM | POA: Diagnosis not present

## 2024-05-08 DIAGNOSIS — I4819 Other persistent atrial fibrillation: Secondary | ICD-10-CM | POA: Diagnosis not present

## 2024-05-08 DIAGNOSIS — C679 Malignant neoplasm of bladder, unspecified: Secondary | ICD-10-CM | POA: Diagnosis not present

## 2024-05-08 DIAGNOSIS — I700 Atherosclerosis of aorta: Secondary | ICD-10-CM | POA: Diagnosis not present

## 2024-05-08 DIAGNOSIS — I509 Heart failure, unspecified: Secondary | ICD-10-CM | POA: Diagnosis not present

## 2024-05-08 DIAGNOSIS — N183 Chronic kidney disease, stage 3 unspecified: Secondary | ICD-10-CM | POA: Diagnosis not present

## 2024-05-08 NOTE — Telephone Encounter (Signed)
 Spoke with Eveline at [facility]. Myles Eveline per Dr Wynelle last OV note on 02/15/24 the pt was to resume on plavix  after urologic procedure. Eveline stated understanding.

## 2024-05-08 NOTE — Telephone Encounter (Signed)
 Pt c/o medication issue:  1. Name of Medication: Plavix   2. How are you currently taking this medication (dosage and times per day)?   3. Are you having a reaction (difficulty breathing--STAT)?   4. What is your medication issue? Eveline called wanting to know if patient needs to go back on Plavix .  Patient told he was taken off of it.

## 2024-05-08 NOTE — Progress Notes (Signed)
   05/08/2024  Patient ID: Adam Harris, male   DOB: [birth year], 78 y.o.   MRN: [id number]  This patient is appearing on a report for being at risk of failing the adherence measure for hypertension (ACEi/ARB) medications this calendar year.   Medication: olmesartan  40 mg Last fill date: 04/10/24 for 30 day supply  Insurance report was not up to date. No action needed at this time.   Adam Harris, PharmD, DPLA

## 2024-05-12 DIAGNOSIS — C679 Malignant neoplasm of bladder, unspecified: Secondary | ICD-10-CM | POA: Diagnosis not present

## 2024-05-12 DIAGNOSIS — I700 Atherosclerosis of aorta: Secondary | ICD-10-CM | POA: Diagnosis not present

## 2024-05-12 DIAGNOSIS — M1990 Unspecified osteoarthritis, unspecified site: Secondary | ICD-10-CM | POA: Diagnosis not present

## 2024-05-12 DIAGNOSIS — I4819 Other persistent atrial fibrillation: Secondary | ICD-10-CM | POA: Diagnosis not present

## 2024-05-12 DIAGNOSIS — F419 Anxiety disorder, unspecified: Secondary | ICD-10-CM | POA: Diagnosis not present

## 2024-05-12 DIAGNOSIS — I130 Hypertensive heart and chronic kidney disease with heart failure and stage 1 through stage 4 chronic kidney disease, or unspecified chronic kidney disease: Secondary | ICD-10-CM | POA: Diagnosis not present

## 2024-05-12 DIAGNOSIS — N1832 Chronic kidney disease, stage 3b: Secondary | ICD-10-CM | POA: Diagnosis not present

## 2024-05-12 DIAGNOSIS — E1122 Type 2 diabetes mellitus with diabetic chronic kidney disease: Secondary | ICD-10-CM | POA: Diagnosis not present

## 2024-05-12 DIAGNOSIS — I509 Heart failure, unspecified: Secondary | ICD-10-CM | POA: Diagnosis not present

## 2024-05-12 DIAGNOSIS — N183 Chronic kidney disease, stage 3 unspecified: Secondary | ICD-10-CM | POA: Diagnosis not present

## 2024-05-13 DIAGNOSIS — I129 Hypertensive chronic kidney disease with stage 1 through stage 4 chronic kidney disease, or unspecified chronic kidney disease: Secondary | ICD-10-CM | POA: Diagnosis not present

## 2024-05-13 DIAGNOSIS — E1122 Type 2 diabetes mellitus with diabetic chronic kidney disease: Secondary | ICD-10-CM | POA: Diagnosis not present

## 2024-05-13 DIAGNOSIS — I2510 Atherosclerotic heart disease of native coronary artery without angina pectoris: Secondary | ICD-10-CM | POA: Diagnosis not present

## 2024-05-13 DIAGNOSIS — N400 Enlarged prostate without lower urinary tract symptoms: Secondary | ICD-10-CM | POA: Diagnosis not present

## 2024-05-13 DIAGNOSIS — N183 Chronic kidney disease, stage 3 unspecified: Secondary | ICD-10-CM | POA: Diagnosis not present

## 2024-05-13 DIAGNOSIS — R809 Proteinuria, unspecified: Secondary | ICD-10-CM | POA: Diagnosis not present

## 2024-05-13 DIAGNOSIS — Z905 Acquired absence of kidney: Secondary | ICD-10-CM | POA: Diagnosis not present

## 2024-05-14 DIAGNOSIS — M1990 Unspecified osteoarthritis, unspecified site: Secondary | ICD-10-CM | POA: Diagnosis not present

## 2024-05-14 DIAGNOSIS — I509 Heart failure, unspecified: Secondary | ICD-10-CM | POA: Diagnosis not present

## 2024-05-14 DIAGNOSIS — I4819 Other persistent atrial fibrillation: Secondary | ICD-10-CM | POA: Diagnosis not present

## 2024-05-14 DIAGNOSIS — C679 Malignant neoplasm of bladder, unspecified: Secondary | ICD-10-CM | POA: Diagnosis not present

## 2024-05-14 DIAGNOSIS — N183 Chronic kidney disease, stage 3 unspecified: Secondary | ICD-10-CM | POA: Diagnosis not present

## 2024-05-14 DIAGNOSIS — E1122 Type 2 diabetes mellitus with diabetic chronic kidney disease: Secondary | ICD-10-CM | POA: Diagnosis not present

## 2024-05-14 DIAGNOSIS — I130 Hypertensive heart and chronic kidney disease with heart failure and stage 1 through stage 4 chronic kidney disease, or unspecified chronic kidney disease: Secondary | ICD-10-CM | POA: Diagnosis not present

## 2024-05-14 DIAGNOSIS — F419 Anxiety disorder, unspecified: Secondary | ICD-10-CM | POA: Diagnosis not present

## 2024-05-14 DIAGNOSIS — I700 Atherosclerosis of aorta: Secondary | ICD-10-CM | POA: Diagnosis not present

## 2024-05-19 ENCOUNTER — Ambulatory Visit: Admitting: Family Medicine

## 2024-05-19 ENCOUNTER — Encounter: Payer: Self-pay | Admitting: Family Medicine

## 2024-05-19 ENCOUNTER — Ambulatory Visit: Payer: Self-pay | Admitting: Family Medicine

## 2024-05-19 VITALS — BP 110/72 | HR 100 | Temp 97.0°F | Ht 71.0 in | Wt 197.0 lb

## 2024-05-19 DIAGNOSIS — M1990 Unspecified osteoarthritis, unspecified site: Secondary | ICD-10-CM | POA: Diagnosis not present

## 2024-05-19 DIAGNOSIS — Z7984 Long term (current) use of oral hypoglycemic drugs: Secondary | ICD-10-CM

## 2024-05-19 DIAGNOSIS — I5032 Chronic diastolic (congestive) heart failure: Secondary | ICD-10-CM | POA: Diagnosis not present

## 2024-05-19 DIAGNOSIS — I4819 Other persistent atrial fibrillation: Secondary | ICD-10-CM | POA: Diagnosis not present

## 2024-05-19 DIAGNOSIS — R600 Localized edema: Secondary | ICD-10-CM | POA: Diagnosis not present

## 2024-05-19 DIAGNOSIS — I700 Atherosclerosis of aorta: Secondary | ICD-10-CM | POA: Diagnosis not present

## 2024-05-19 DIAGNOSIS — F419 Anxiety disorder, unspecified: Secondary | ICD-10-CM | POA: Diagnosis not present

## 2024-05-19 DIAGNOSIS — E1159 Type 2 diabetes mellitus with other circulatory complications: Secondary | ICD-10-CM | POA: Diagnosis not present

## 2024-05-19 DIAGNOSIS — I509 Heart failure, unspecified: Secondary | ICD-10-CM | POA: Diagnosis not present

## 2024-05-19 DIAGNOSIS — N183 Chronic kidney disease, stage 3 unspecified: Secondary | ICD-10-CM | POA: Diagnosis not present

## 2024-05-19 DIAGNOSIS — I130 Hypertensive heart and chronic kidney disease with heart failure and stage 1 through stage 4 chronic kidney disease, or unspecified chronic kidney disease: Secondary | ICD-10-CM | POA: Diagnosis not present

## 2024-05-19 DIAGNOSIS — C679 Malignant neoplasm of bladder, unspecified: Secondary | ICD-10-CM | POA: Diagnosis not present

## 2024-05-19 DIAGNOSIS — E1122 Type 2 diabetes mellitus with diabetic chronic kidney disease: Secondary | ICD-10-CM | POA: Diagnosis not present

## 2024-05-19 LAB — HEMOGLOBIN A1C: Hgb A1c MFr Bld: 8.3 % — ABNORMAL HIGH (ref 4.6–6.5)

## 2024-05-19 LAB — BASIC METABOLIC PANEL WITH GFR
BUN: 48 mg/dL — ABNORMAL HIGH (ref 6–23)
CO2: 28 meq/L (ref 19–32)
Calcium: 9.3 mg/dL (ref 8.4–10.5)
Chloride: 96 meq/L (ref 96–112)
Creatinine, Ser: 2.47 mg/dL — ABNORMAL HIGH (ref 0.40–1.50)
GFR: 24.53 mL/min — ABNORMAL LOW (ref >60.00)
Glucose, Bld: 223 mg/dL — ABNORMAL HIGH (ref 70–99)
Potassium: 3.5 meq/L (ref 3.5–5.1)
Sodium: 133 meq/L — ABNORMAL LOW (ref 135–145)

## 2024-05-19 MED ORDER — SITAGLIPTIN PHOSPHATE 50 MG PO TABS
50.0000 mg | ORAL_TABLET | Freq: Every day | ORAL | 3 refills | Status: DC
Start: 2024-05-19 — End: 2024-07-30

## 2024-05-19 NOTE — Addendum Note (Signed)
 Addended by: [name] on: 05/19/2024 04:45 PM   Modules accepted: Orders

## 2024-05-19 NOTE — Assessment & Plan Note (Signed)
 Mr. Condrey weight is down 5 lbs and his legs appear less swollen. Continue metoprolol  tartrate 25 mg twice daily, olmesartan  40 mg daily, and furosemide  20 mg two tablets bid. I will reassess his BMP today to make sure he is renal function is maintaining.

## 2024-05-19 NOTE — Assessment & Plan Note (Addendum)
 The A1c has increased since stopping dapagliflozin. I will increase his sitagliptin  (Januvia ) to 50 mg daily.

## 2024-05-19 NOTE — Progress Notes (Addendum)
 [facility] [address] [place] [address] Dept: [phone] Dept Fax: [phone]  Office Visit  Subjective:    Patient ID: Adam Harris, male    DOB: [birth year], 78 y.o..   MRN: [id number]  Chief Complaint  Patient presents with   Follow-up    4 week f/u fatigue.    History of Present Illness:  Patient is in today for follow up of his lower leg edema. Adam Harris has a history of chronic diastolic heart failure. He is managed on metoprolol  tartrate 25 mg twice daily, olmesartan  20 mg daily (reduced by nephrology), and PRN furosemide . I had last seen him on 6/6, noting he had been taking furosemide  40 mg bid. Despite this, he noted quite a bit of swelling of his lower legs. He was not having dyspnea. I added metolazone  2.5 mg to his regimen for 7 days.   Past Medical History: Patient Active Problem List   Diagnosis Date Noted   Nephrostomy present (HCC) 04/01/2024   AKI (acute kidney injury) (HCC) 03/08/2024   Tachyarrhythmia 03/07/2024   Hematuria 03/07/2024   Acute blood loss anemia 03/07/2024   Dysuria 01/25/2024   Presence of Watchman left atrial appendage closure device 12/13/2023   Permanent atrial fibrillation (HCC) 11/29/2023   Hx of hematuria 11/29/2023   Lower urinary tract symptoms (LUTS) 04/06/2023   CKD (chronic kidney disease), stage IV (HCC) 03/22/2023   Prostate cancer (HCC) 06/21/2022   Cataract cortical, senile, left 12/13/2021   Diabetic peripheral neuropathy (HCC) 12/13/2021   Hydrocele- Left 12/02/2021   Solitary kidney, acquired 10/26/2021   Hypertensive renal disease 10/26/2021   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   Chronic diastolic CHF (congestive heart failure), NYHA class 2 (HCC) 04/02/2017   Atrial fibrillation with RVR (HCC) 11/20/2016   OA (osteoarthritis) of knee 05/31/2016   Low back pain  05/31/2016   Exertional angina (HCC) 10/29/2014   Coronary artery disease involving native coronary artery of native heart with angina pectoris (HCC) 03/09/2014   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/2014   Hyperlipidemia 02/05/2014   History of non-ST elevation myocardial infarction (NSTEMI) 02/03/2014   Essential hypertension 02/03/2014   Anxiety state 04/19/2013   History of prostate cancer 04/19/2013   Insomnia 04/19/2013   Stiffness of joint, lower leg 04/19/2013   Sciatica 04/19/2013   Past Surgical History:  Procedure Laterality Date   CARDIAC CATHETERIZATION N/A 11/21/2016   Procedure: Left Heart Cath and Coronary Angiography;  Surgeon: Debby DELENA Sor, MD;  Location: [facility];  Service: Cardiovascular;  Laterality: N/A;  pRCA 100% , ostial LAD 45%, OM3 80%, mCFX to dCFX 100% (AV groove), lateral OM3 50%   COLONOSCOPY     CYSTOSCOPY WITH BIOPSY N/A 08/12/2020   Procedure: CYSTOSCOPY WITH BLADDER BIOPSY AND TRANSURETHRAL RESECTION OF BLADDER TUMOR;  Surgeon: [name], MD;  Location: [facility];  Service: Urology;  Laterality: N/A;   CYSTOSCOPY WITH BIOPSY N/A 11/10/2021   Procedure: CYSTOSCOPY WITH BLADDER BIOPSIES/ LEFT RETROGRADE;  Surgeon: [name], MD;  Location: [facility];  Service: Urology;  Laterality: N/A;   CYSTOSCOPY WITH BIOPSY Left 07/27/2022   Procedure: CYSTOSCOPY WITH  BLADDER BIOPSY, TRANSURETHRAL FULGERATION OF BLADDER, EXAM UNDER ANESTHESIA, LEFT RETROGRADE PYELOGRAM;  Surgeon: [name], MD;  Location: [facility];  Service: Urology;  Laterality: Left;   CYSTOSCOPY WITH BIOPSY N/A 04/23/2023   Procedure: CYSTOSCOPY  WITH BLADDER AND PROSTATIC URETHRAL BIOPSIES;  Surgeon: [name], MD;  Location: [facility];  Service: Urology;  Laterality: N/A;  60 MINUTES NEEDED FOR CASE   CYSTOSCOPY WITH BIOPSY N/A 03/06/2024   Procedure: CYSTOSCOPY, WITH BIOPSY;  Surgeon: [name], MD;  Location: [facility];  Service: Urology;  Laterality:  N/A;  CYSTOSCOPY WITH BLADDER BIOPSIES   CYSTOSCOPY WITH FULGERATION N/A 03/06/2024   Procedure: CYSTOSCOPY, WITH BLADDER FULGURATION, TURBT;  Surgeon: [name], MD;  Location: [facility];  Service: Urology;  Laterality: N/A;   CYSTOSCOPY WITH URETEROSCOPY AND STENT PLACEMENT Right 12/15/2019   Procedure: CYSTOSCOPY WITH RIGHT RETROGRADE/ RIGHT URETEROSCOPY/ BIOPSY;  Surgeon: Ottelin, Mark, MD;  Location: [facility];  Service: Urology;  Laterality: Right;   CYSTOSCOPY/RETROGRADE/URETEROSCOPY Bilateral 03/18/2018   Procedure: CYSTOSCOPY/RETROGRADE/URETEROSCOPY.;  Surgeon: Ottelin, Mark, MD;  Location: [facility];  Service: Urology;  Laterality: Bilateral;   EYE SURGERY Bilateral    cateract in January 2023   HYDROCELE EXCISION Left 07/27/2022   Procedure: HYDROCELE REPAIR;  Surgeon: [name], MD;  Location: [facility];  Service: Urology;  Laterality: Left;  GENERAL ANESTHESIA WITH PARALYSIS   IR NEPHROSTOMY EXCHANGE LEFT  03/10/2024   IR NEPHROSTOMY EXCHANGE LEFT  04/28/2024   IR NEPHROSTOMY PLACEMENT LEFT  03/07/2024   KNEE ARTHROSCOPY Right    LEFT ATRIAL APPENDAGE OCCLUSION N/A 12/13/2023   Procedure: LEFT ATRIAL APPENDAGE OCCLUSION;  Surgeon: [name], MD;  Location: [facility] INVASIVE CV [facility];  Service: Cardiovascular;  Laterality: N/A;   LEFT HEART CATHETERIZATION WITH CORONARY ANGIOGRAM N/A 02/04/2014   Procedure: LEFT HEART CATHETERIZATION WITH CORONARY ANGIOGRAM;  Surgeon: Deatrice DELENA Cage, MD;  Location: [facility];  Service: Cardiovascular;  Laterality: N/A;  severe one-vessel CAD, chronically occluded RCA with faint left-to-right collaterals;  aneurysmal LCFx with sluggish coronary flow;  normal LVSF w/ moderately elevated LVEDP (ostialOM2 20%, pOM3 20%, pD1 20%, mCFX 50%, diffuse 20% pCFX)   PROSTATE BIOPSY N/A 08/12/2020   Procedure: BIOPSY TRANSRECTAL ULTRASONIC PROSTATE (TUBP);  Surgeon: [name], MD;  Location: [facility];  Service: Urology;   Laterality: N/A;   ROBOT ASSITED LAPAROSCOPIC NEPHROURETERECTOMY Right 03/22/2020   Procedure: XI ROBOT ASSITED LAPAROSCOPIC NEPHROURETERECTOMY;  Surgeon: [name], MD;  Location: [facility];  Service: Urology;  Laterality: Right;   TRANSESOPHAGEAL ECHOCARDIOGRAM (CATH LAB) N/A 12/13/2023   Procedure: TRANSESOPHAGEAL ECHOCARDIOGRAM;  Surgeon: [name], MD;  Location: [facility] INVASIVE CV LAB;  Service: Cardiovascular;  Laterality: N/A;   TRANSESOPHAGEAL ECHOCARDIOGRAM (CATH LAB) N/A 02/11/2024   Procedure: TRANSESOPHAGEAL ECHOCARDIOGRAM;  Surgeon: Barbaraann Darryle Ned, MD;  Location: [facility] INVASIVE CV LAB;  Service: Cardiovascular;  Laterality: N/A;   TRANSTHORACIC ECHOCARDIOGRAM  11-22-2016   dr hochrein   ef 55-60%/ mild MR and TR/ moderate LAE   TRANSURETHRAL RESECTION OF BLADDER TUMOR N/A 02/10/2021   Procedure: TRANSURETHRAL RESECTION OF BLADDER TUMOR (TURBT)/ CYSTOSCOPY/ LEFT RETROGRADE;  Surgeon: [name], MD;  Location: [facility];  Service: Urology;  Laterality: N/A;  GENERAL ANESTHESIA WITH PARALYSIS   Family History  Problem Relation Age of Onset   Hypertension Mother    Cancer Mother        anal cancer   Esophageal cancer Neg Hx    Pancreatic cancer Neg Hx    Stomach cancer Neg Hx    Liver disease Neg Hx    Outpatient Medications Prior to Visit  Medication Sig Dispense Refill   acetaminophen -codeine  (TYLENOL  #3) 300-30 MG tablet Take 1 tablet by mouth every 8 (eight) hours as  needed for moderate pain (pain score 4-6). 20 tablet 0   amLODipine  (NORVASC ) 5 MG tablet TAKE 1 TABLET (5 MG TOTAL) BY MOUTH DAILY 90 tablet 3   aspirin  EC 81 MG tablet Take 1 tablet (81 mg total) by mouth daily. Swallow whole. 30 tablet 12   Cholecalciferol  (VITAMIN D3) 1000 units CAPS Take 1,000 Units by mouth daily.     furosemide  (LASIX ) 20 MG tablet Take 1 tablet (20 mg total) by mouth daily. For the next 3 days take 40 mg (2 tabs) daily then resume one tab daily. 96 tablet 3   hydrOXYzine   (VISTARIL ) 25 MG capsule Take 1 capsule (25 mg total) by mouth every 8 (eight) hours as needed. 30 capsule 2   isosorbide  mononitrate (IMDUR ) 30 MG 24 hr tablet TAKE ONE TABLET BY MOUTH DAILY 90 tablet 3   metolazone  (ZAROXOLYN ) 2.5 MG tablet Take 1 tablet (2.5 mg total) by mouth daily. 7 tablet 0   metoprolol  tartrate (LOPRESSOR ) 25 MG tablet TAKE ONE TABLET BY MOUTH TWICE A DAY 180 tablet 2   nitroGLYCERIN  (NITROSTAT ) 0.4 MG SL tablet TAKE ONE TABLET UNDER THE TONGUE EVERY 5 MINS FOR THREE DOSES AS NEEDED FOR CHEST PAIN. CALL 911 IF 2ND DOSE DOESN'T HELP 25 tablet 11   olmesartan  (BENICAR ) 40 MG tablet Take 1 tablet (40 mg total) by mouth daily. (Patient taking differently: Take 20 mg by mouth daily.) 30 tablet 0   ondansetron  (ZOFRAN ) 8 MG tablet Take 1 tablet (8 mg total) by mouth every 8 (eight) hours as needed for nausea or vomiting. 20 tablet 0   Pembrolizumab  (KEYTRUDA  IV) Inject into the vein every 14 (fourteen) days.     rosuvastatin  (CRESTOR ) 40 MG tablet TAKE 1 TABLET (40 MG TOTAL) BY MOUTH EVERY MORNING. PLEASE KEEP SCHEDULED APPOINTMENT 90 tablet 3   sitaGLIPtin  (JANUVIA ) 25 MG tablet TAKE ONE TABLET BY MOUTH DAILY 90 tablet 3   sodium bicarbonate 650 MG tablet Take 650 mg by mouth 2 (two) times daily.     tamsulosin  (FLOMAX ) 0.4 MG CAPS capsule TAKE ONE CAPSULE BY MOUTH DAILY 30 capsule 11   traZODone  (DESYREL ) 50 MG tablet Take 0.5-1 tablets (25-50 mg total) by mouth at bedtime as needed for sleep. 30 tablet 3   No facility-administered medications prior to visit.   Allergies  Allergen Reactions   Xarelto  [Rivaroxaban ] Other (See Comments)    Skin Blisters      Objective:   Today's Vitals   05/19/24 1355  BP: 110/72  Pulse: 100  Temp: (!) 97 F (36.1 C)  TempSrc: Temporal  SpO2: 98%  Weight: 197 lb (89.4 kg)  Height: 5' 11 (1.803 m)   Body mass index is 27.48 kg/m.   General: Well developed, well nourished. No acute distress. Extremities: 1-2+ edema  noted. Feet- Skin intact, but dry with superficial peeling. No sign of maceration between toes. Nails are normal. Dorsalis pedis   and posterior tibial artery pulses are normal. 5.07 monofilament testing shows his feet are mostly insensate. Psych: Alert and oriented. Normal mood and affect.  Health Maintenance Due  Topic Date Due   Diabetic kidney evaluation - Urine ACR  Never done   Zoster Vaccines- Shingrix (1 of 2) Never done   OPHTHALMOLOGY EXAM  05/18/2022     Lab Results: Lab Results  Component Value Date   HGBA1C 8.3 (H) 05/19/2024   HGBA1C 6.8 (H) 03/07/2024   HGBA1C 6.7 (H) 08/06/2023   Assessment & Plan:   Problem  List Items Addressed This Visit       Cardiovascular and Mediastinum   Chronic diastolic CHF (congestive heart failure), NYHA class 2 (HCC) - Primary   Adam Harris weight is down 5 lbs and his legs appear less swollen. Continue metoprolol  tartrate 25 mg twice daily, olmesartan  40 mg daily, and furosemide  20 mg two tablets bid. I will reassess his BMP today to make sure he is renal function is maintaining.      Type 2 diabetes mellitus with cardiac complication (HCC)   The A1c has increased since stopping dapagliflozin. I will increase his sitagliptin  (Januvia ) to 50 mg daily.      Relevant Medications   sitaGLIPtin  (JANUVIA ) 50 MG tablet   Other Relevant Orders   Hemoglobin A1c (Completed)   Other Visit Diagnoses       Lower leg edema       Decreased. Cotninue furosemide , use of compression stockings, and elevating legs for several hours each day.   Relevant Orders   Basic metabolic panel with GFR (Completed)       Return in about 3 months (around 08/19/2024) for Reassessment.   Garnette CHRISTELLA Simpler, MD

## 2024-05-20 ENCOUNTER — Other Ambulatory Visit: Payer: Self-pay

## 2024-05-21 DIAGNOSIS — C61 Malignant neoplasm of prostate: Secondary | ICD-10-CM | POA: Diagnosis not present

## 2024-05-22 DIAGNOSIS — I700 Atherosclerosis of aorta: Secondary | ICD-10-CM | POA: Diagnosis not present

## 2024-05-22 DIAGNOSIS — I4819 Other persistent atrial fibrillation: Secondary | ICD-10-CM | POA: Diagnosis not present

## 2024-05-22 DIAGNOSIS — N183 Chronic kidney disease, stage 3 unspecified: Secondary | ICD-10-CM | POA: Diagnosis not present

## 2024-05-22 DIAGNOSIS — I130 Hypertensive heart and chronic kidney disease with heart failure and stage 1 through stage 4 chronic kidney disease, or unspecified chronic kidney disease: Secondary | ICD-10-CM | POA: Diagnosis not present

## 2024-05-22 DIAGNOSIS — C679 Malignant neoplasm of bladder, unspecified: Secondary | ICD-10-CM | POA: Diagnosis not present

## 2024-05-22 DIAGNOSIS — M1990 Unspecified osteoarthritis, unspecified site: Secondary | ICD-10-CM | POA: Diagnosis not present

## 2024-05-22 DIAGNOSIS — E1122 Type 2 diabetes mellitus with diabetic chronic kidney disease: Secondary | ICD-10-CM | POA: Diagnosis not present

## 2024-05-22 DIAGNOSIS — F419 Anxiety disorder, unspecified: Secondary | ICD-10-CM | POA: Diagnosis not present

## 2024-05-22 DIAGNOSIS — I509 Heart failure, unspecified: Secondary | ICD-10-CM | POA: Diagnosis not present

## 2024-05-24 ENCOUNTER — Other Ambulatory Visit: Payer: Self-pay

## 2024-05-24 ENCOUNTER — Encounter (HOSPITAL_BASED_OUTPATIENT_CLINIC_OR_DEPARTMENT_OTHER): Payer: Self-pay | Admitting: Emergency Medicine

## 2024-05-24 ENCOUNTER — Inpatient Hospital Stay (HOSPITAL_BASED_OUTPATIENT_CLINIC_OR_DEPARTMENT_OTHER)
Admission: EM | Admit: 2024-05-24 | Discharge: 2024-06-03 | DRG: 674 | Disposition: A | Discharge status: Home / Self Care | Attending: Internal Medicine | Admitting: Internal Medicine

## 2024-05-24 DIAGNOSIS — Z8619 Personal history of other infectious and parasitic diseases: Secondary | ICD-10-CM

## 2024-05-24 DIAGNOSIS — Z1152 Encounter for screening for COVID-19: Secondary | ICD-10-CM | POA: Diagnosis not present

## 2024-05-24 DIAGNOSIS — Z79899 Other long term (current) drug therapy: Secondary | ICD-10-CM | POA: Diagnosis not present

## 2024-05-24 DIAGNOSIS — Z7901 Long term (current) use of anticoagulants: Secondary | ICD-10-CM | POA: Diagnosis not present

## 2024-05-24 DIAGNOSIS — N184 Chronic kidney disease, stage 4 (severe): Secondary | ICD-10-CM | POA: Diagnosis present

## 2024-05-24 DIAGNOSIS — R590 Localized enlarged lymph nodes: Secondary | ICD-10-CM | POA: Diagnosis present

## 2024-05-24 DIAGNOSIS — Z7982 Long term (current) use of aspirin: Secondary | ICD-10-CM | POA: Diagnosis not present

## 2024-05-24 DIAGNOSIS — E1122 Type 2 diabetes mellitus with diabetic chronic kidney disease: Secondary | ICD-10-CM | POA: Diagnosis present

## 2024-05-24 DIAGNOSIS — N189 Chronic kidney disease, unspecified: Secondary | ICD-10-CM | POA: Diagnosis not present

## 2024-05-24 DIAGNOSIS — Z7984 Long term (current) use of oral hypoglycemic drugs: Secondary | ICD-10-CM

## 2024-05-24 DIAGNOSIS — N10 Acute tubulo-interstitial nephritis: Secondary | ICD-10-CM | POA: Diagnosis not present

## 2024-05-24 DIAGNOSIS — Z936 Other artificial openings of urinary tract status: Secondary | ICD-10-CM | POA: Diagnosis not present

## 2024-05-24 DIAGNOSIS — R109 Unspecified abdominal pain: Secondary | ICD-10-CM | POA: Diagnosis not present

## 2024-05-24 DIAGNOSIS — Z888 Allergy status to other drugs, medicaments and biological substances status: Secondary | ICD-10-CM

## 2024-05-24 DIAGNOSIS — Z8249 Family history of ischemic heart disease and other diseases of the circulatory system: Secondary | ICD-10-CM | POA: Diagnosis not present

## 2024-05-24 DIAGNOSIS — N400 Enlarged prostate without lower urinary tract symptoms: Secondary | ICD-10-CM | POA: Diagnosis not present

## 2024-05-24 DIAGNOSIS — E785 Hyperlipidemia, unspecified: Secondary | ICD-10-CM | POA: Diagnosis present

## 2024-05-24 DIAGNOSIS — E871 Hypo-osmolality and hyponatremia: Secondary | ICD-10-CM | POA: Diagnosis not present

## 2024-05-24 DIAGNOSIS — K5900 Constipation, unspecified: Secondary | ICD-10-CM | POA: Diagnosis present

## 2024-05-24 DIAGNOSIS — I252 Old myocardial infarction: Secondary | ICD-10-CM

## 2024-05-24 DIAGNOSIS — I130 Hypertensive heart and chronic kidney disease with heart failure and stage 1 through stage 4 chronic kidney disease, or unspecified chronic kidney disease: Secondary | ICD-10-CM | POA: Diagnosis present

## 2024-05-24 DIAGNOSIS — I4821 Permanent atrial fibrillation: Secondary | ICD-10-CM | POA: Diagnosis present

## 2024-05-24 DIAGNOSIS — N179 Acute kidney failure, unspecified: Secondary | ICD-10-CM | POA: Diagnosis not present

## 2024-05-24 DIAGNOSIS — E782 Mixed hyperlipidemia: Secondary | ICD-10-CM | POA: Diagnosis not present

## 2024-05-24 DIAGNOSIS — Z8616 Personal history of COVID-19: Secondary | ICD-10-CM | POA: Diagnosis not present

## 2024-05-24 DIAGNOSIS — E860 Dehydration: Secondary | ICD-10-CM | POA: Diagnosis present

## 2024-05-24 DIAGNOSIS — E1165 Type 2 diabetes mellitus with hyperglycemia: Secondary | ICD-10-CM | POA: Diagnosis present

## 2024-05-24 DIAGNOSIS — I2510 Atherosclerotic heart disease of native coronary artery without angina pectoris: Secondary | ICD-10-CM | POA: Diagnosis present

## 2024-05-24 DIAGNOSIS — Z8559 Personal history of malignant neoplasm of other urinary tract organ: Secondary | ICD-10-CM

## 2024-05-24 DIAGNOSIS — R4182 Altered mental status, unspecified: Secondary | ICD-10-CM | POA: Diagnosis not present

## 2024-05-24 DIAGNOSIS — N390 Urinary tract infection, site not specified: Secondary | ICD-10-CM | POA: Diagnosis not present

## 2024-05-24 DIAGNOSIS — C689 Malignant neoplasm of urinary organ, unspecified: Secondary | ICD-10-CM | POA: Diagnosis present

## 2024-05-24 DIAGNOSIS — C676 Malignant neoplasm of ureteric orifice: Secondary | ICD-10-CM | POA: Diagnosis not present

## 2024-05-24 DIAGNOSIS — R10817 Generalized abdominal tenderness: Secondary | ICD-10-CM | POA: Diagnosis not present

## 2024-05-24 DIAGNOSIS — Z8546 Personal history of malignant neoplasm of prostate: Secondary | ICD-10-CM

## 2024-05-24 DIAGNOSIS — R531 Weakness: Secondary | ICD-10-CM

## 2024-05-24 DIAGNOSIS — I10 Essential (primary) hypertension: Secondary | ICD-10-CM | POA: Diagnosis present

## 2024-05-24 DIAGNOSIS — Z95818 Presence of other cardiac implants and grafts: Secondary | ICD-10-CM

## 2024-05-24 DIAGNOSIS — N12 Tubulo-interstitial nephritis, not specified as acute or chronic: Principal | ICD-10-CM | POA: Diagnosis present

## 2024-05-24 DIAGNOSIS — Z8551 Personal history of malignant neoplasm of bladder: Secondary | ICD-10-CM

## 2024-05-24 DIAGNOSIS — Z905 Acquired absence of kidney: Secondary | ICD-10-CM

## 2024-05-24 DIAGNOSIS — R591 Generalized enlarged lymph nodes: Secondary | ICD-10-CM | POA: Diagnosis not present

## 2024-05-24 DIAGNOSIS — I5032 Chronic diastolic (congestive) heart failure: Secondary | ICD-10-CM | POA: Diagnosis present

## 2024-05-24 DIAGNOSIS — C775 Secondary and unspecified malignant neoplasm of intrapelvic lymph nodes: Secondary | ICD-10-CM | POA: Diagnosis not present

## 2024-05-24 DIAGNOSIS — C679 Malignant neoplasm of bladder, unspecified: Secondary | ICD-10-CM | POA: Diagnosis not present

## 2024-05-24 DIAGNOSIS — I959 Hypotension, unspecified: Secondary | ICD-10-CM | POA: Diagnosis not present

## 2024-05-24 LAB — COMPREHENSIVE METABOLIC PANEL WITH GFR
ALT: 17 U/L (ref 0–44)
AST: 25 U/L (ref 15–41)
Albumin: 3.2 g/dL — ABNORMAL LOW (ref 3.5–5.0)
Alkaline Phosphatase: 128 U/L — ABNORMAL HIGH (ref 38–126)
Anion gap: 12 (ref 5–15)
BUN: 52 mg/dL — ABNORMAL HIGH (ref 8–23)
CO2: 22 mmol/L (ref 22–32)
Calcium: 8.7 mg/dL — ABNORMAL LOW (ref 8.9–10.3)
Chloride: 99 mmol/L (ref 98–111)
Creatinine, Ser: 2.05 mg/dL — ABNORMAL HIGH (ref 0.61–1.24)
GFR, Estimated: 33 mL/min — ABNORMAL LOW (ref >60)
Glucose, Bld: 210 mg/dL — ABNORMAL HIGH (ref 70–99)
Potassium: 4.5 mmol/L (ref 3.5–5.1)
Sodium: 133 mmol/L — ABNORMAL LOW (ref 135–145)
Total Bilirubin: 0.7 mg/dL (ref 0.0–1.2)
Total Protein: 7.7 g/dL (ref 6.5–8.1)

## 2024-05-24 LAB — CBC WITH DIFFERENTIAL/PLATELET
Abs Immature Granulocytes: 0.09 K/uL — ABNORMAL HIGH (ref 0.00–0.07)
Basophils Absolute: 0.1 K/uL (ref 0.0–0.1)
Basophils Relative: 1 %
Eosinophils Absolute: 0.1 K/uL (ref 0.0–0.5)
Eosinophils Relative: 1 %
HCT: 34.3 % — ABNORMAL LOW (ref 39.0–52.0)
Hemoglobin: 10.8 g/dL — ABNORMAL LOW (ref 13.0–17.0)
Immature Granulocytes: 1 %
Lymphocytes Relative: 8 %
Lymphs Abs: 1.2 K/uL (ref 0.7–4.0)
MCH: 23.7 pg — ABNORMAL LOW (ref 26.0–34.0)
MCHC: 31.5 g/dL (ref 30.0–36.0)
MCV: 75.2 fL — ABNORMAL LOW (ref 80.0–100.0)
Monocytes Absolute: 1.8 K/uL — ABNORMAL HIGH (ref 0.1–1.0)
Monocytes Relative: 12 %
Neutro Abs: 12 K/uL — ABNORMAL HIGH (ref 1.7–7.7)
Neutrophils Relative %: 77 %
Platelets: 318 K/uL (ref 150–400)
RBC: 4.56 MIL/uL (ref 4.22–5.81)
RDW: 19.5 % — ABNORMAL HIGH (ref 11.5–15.5)
WBC: 15.3 K/uL — ABNORMAL HIGH (ref 4.0–10.5)
nRBC: 0 % (ref 0.0–0.2)

## 2024-05-24 LAB — RESP PANEL BY RT-PCR (RSV, FLU A&B, COVID)  RVPGX2
Influenza A by PCR: NEGATIVE
Influenza B by PCR: NEGATIVE
Resp Syncytial Virus by PCR: NEGATIVE
SARS Coronavirus 2 by RT PCR: NEGATIVE

## 2024-05-24 MED ORDER — ONDANSETRON HCL 4 MG/2ML IJ SOLN
4.0000 mg | Freq: Once | INTRAMUSCULAR | Status: AC
  Administered 2024-05-25: 4 mg via INTRAVENOUS
  Filled 2024-05-24: qty 2

## 2024-05-24 MED ORDER — FENTANYL CITRATE PF 50 MCG/ML IJ SOSY
50.0000 ug | PREFILLED_SYRINGE | Freq: Once | INTRAMUSCULAR | Status: AC
  Administered 2024-05-25: 50 ug via INTRAVENOUS
  Filled 2024-05-24: qty 1

## 2024-05-24 MED ORDER — SODIUM CHLORIDE 0.9 % IV BOLUS
500.0000 mL | Freq: Once | INTRAVENOUS | Status: AC
  Administered 2024-05-25: 500 mL via INTRAVENOUS

## 2024-05-24 NOTE — ED Provider Notes (Signed)
 [place] EMERGENCY DEPARTMENT AT [facility] [place] Provider Note   CSN: [id number] Arrival date & time: 05/24/24  2202     Patient presents with: No chief complaint on file.   Adam Harris is a 78 y.o. male.  {Add pertinent medical, surgical, social history, OB history to YEP:[id number]} The history is provided by the patient, the spouse and medical records.  Adam Harris is a 78 y.o. male who presents to the Emergency Department complaining of weakness.  He presents to the emergency department accompanied by his wife for evaluation of feeling unwell and global weakness that started in April.  He states this all stems from a complicated bladder biopsy resulting in a left nephrostomy tube.  He reports that the tube is draining well and he had the last exchange in June.  He reports feeling very bad and cannot sleep.  His thought process is cloudy and he has generalized weakness and fatigue.  He also reports associated lower abdominal pain.  He has nausea with occasional vomiting.  No chest pain or shortness of breath.  No fevers.  He has significant pain at the nephrostomy tube site.  He lives with his wife and daughter.  He walks with a walker or a cane.  He reports no appetite and progressive weight loss.  He is taking Tylenol  3 and trazodone  for sleep and pain and does not feel like these are working.     Prior to Admission medications   Medication Sig Start Date End Date Taking? Authorizing Provider  acetaminophen -codeine  (TYLENOL  #3) 300-30 MG tablet Take 1 tablet by mouth every 8 (eight) hours as needed for moderate pain (pain score 4-6). 05/01/24   Burton, Lacie K, NP  amLODipine  (NORVASC ) 5 MG tablet TAKE 1 TABLET (5 MG TOTAL) BY MOUTH DAILY 12/05/23   [name], MD  aspirin  EC 81 MG tablet Take 1 tablet (81 mg total) by mouth daily. Swallow whole. 03/13/24   [name], MD  Cholecalciferol  (VITAMIN D3) 1000 units CAPS Take 1,000 Units by mouth daily.    [provider]   furosemide  (LASIX ) 20 MG tablet Take 1 tablet (20 mg total) by mouth daily. For the next 3 days take 40 mg (2 tabs) daily then resume one tab daily. 02/15/24   [name], MD  hydrOXYzine  (VISTARIL ) 25 MG capsule Take 1 capsule (25 mg total) by mouth every 8 (eight) hours as needed. 05/11/23   [name], MD  isosorbide  mononitrate (IMDUR ) 30 MG 24 hr tablet TAKE ONE TABLET BY MOUTH DAILY 08/24/23   [name], MD  metolazone  (ZAROXOLYN ) 2.5 MG tablet Take 1 tablet (2.5 mg total) by mouth daily. 05/06/24   [name], MD  metoprolol  tartrate (LOPRESSOR ) 25 MG tablet TAKE ONE TABLET BY MOUTH TWICE A DAY 06/11/23   [name], MD  nitroGLYCERIN  (NITROSTAT ) 0.4 MG SL tablet TAKE ONE TABLET UNDER THE TONGUE EVERY 5 MINS FOR THREE DOSES AS NEEDED FOR CHEST PAIN. CALL 911 IF 2ND DOSE DOESN'T HELP 04/10/24   [name], MD  olmesartan  (BENICAR ) 40 MG tablet Take 1 tablet (40 mg total) by mouth daily. Patient taking differently: Take 20 mg by mouth daily. 04/18/24   [name], MD  ondansetron  (ZOFRAN ) 8 MG tablet Take 1 tablet (8 mg total) by mouth every 8 (eight) hours as needed for nausea or vomiting. 05/01/24   Burton, Lacie K, NP  Pembrolizumab  (KEYTRUDA  IV) Inject into the vein every 14 (fourteen) days.  [provider]  rosuvastatin  (CRESTOR ) 40 MG tablet TAKE 1 TABLET (40 MG TOTAL) BY MOUTH EVERY MORNING. PLEASE KEEP SCHEDULED APPOINTMENT 08/24/23   [name], MD  sitaGLIPtin  (JANUVIA ) 50 MG tablet Take 1 tablet (50 mg total) by mouth daily. 05/19/24   [name], MD  sodium bicarbonate  650 MG tablet Take 650 mg by mouth 2 (two) times daily.    [provider]  tamsulosin  (FLOMAX ) 0.4 MG CAPS capsule TAKE ONE CAPSULE BY MOUTH DAILY 02/22/24   [name], MD  traZODone  (DESYREL ) 50 MG tablet Take 0.5-1 tablets (25-50 mg total) by mouth at bedtime as needed for sleep. 10/16/23   [name], MD    Allergies: Xarelto  [rivaroxaban ]     Review of Systems  All other systems reviewed and are negative.   Updated Vital Signs BP 133/83 (BP Location: Left Arm)   Pulse 90   Temp 99.8 F (37.7 C)   Resp (!) 22   Ht 5' 11 (1.803 m)   Wt 89.4 kg   SpO2 100%   BMI 27.48 kg/m   Physical Exam Vitals and nursing note reviewed.  Constitutional:      Appearance: He is well-developed.  HENT:     Head: Normocephalic and atraumatic.  Cardiovascular:     Rate and Rhythm: Normal rate and regular rhythm.  Pulmonary:     Effort: Pulmonary effort is normal. No respiratory distress.  Abdominal:     Palpations: Abdomen is soft.     Tenderness: There is no guarding or rebound.     Comments: Mild generalized abdominal tenderness.  Left nephrostomy tube in place without local edema or erythema  Musculoskeletal:        General: No tenderness.     Comments: No pitting edema to BLE  Skin:    General: Skin is warm and dry.  Neurological:     Mental Status: He is alert and oriented to person, place, and time.     Comments: Generalized weakness  Psychiatric:        Behavior: Behavior normal.     (all labs ordered are listed, but only abnormal results are displayed) Labs Reviewed  CBC WITH DIFFERENTIAL/PLATELET - Abnormal; Notable for the following components:      Result Value   WBC 15.3 (*)    Hemoglobin 10.8 (*)    HCT 34.3 (*)    MCV 75.2 (*)    MCH 23.7 (*)    RDW 19.5 (*)    Neutro Abs 12.0 (*)    Monocytes Absolute 1.8 (*)    Abs Immature Granulocytes 0.09 (*)    All other components within normal limits  COMPREHENSIVE METABOLIC PANEL WITH GFR - Abnormal; Notable for the following components:   Sodium 133 (*)    Glucose, Bld 210 (*)    BUN 52 (*)    Creatinine, Ser 2.05 (*)    Calcium  8.7 (*)    Albumin  3.2 (*)    Alkaline Phosphatase 128 (*)    GFR, Estimated 33 (*)    All other components within normal limits  RESP PANEL BY RT-PCR (RSV, FLU A&B, COVID)  [id number]    EKG: None  Radiology: No results  found.  {Document cardiac monitor, telemetry assessment procedure when appropriate:32947} Procedures   Medications Ordered in the ED - No data to display    {Click here for ABCD2, HEART and other calculators REFRESH Note before signing:1}  Medical Decision Making Amount and/or Complexity of Data Reviewed Labs: ordered. Radiology: ordered.  Risk Prescription drug management.   ***  {Document critical care time when appropriate  Document review of labs and clinical decision tools ie CHADS2VASC2, etc  Document your independent review of radiology images and any outside records  Document your discussion with family members, caretakers and with consultants  Document social determinants of health affecting pt's care  Document your decision making why or why not admission, treatments were needed:32947:::1}   Final diagnoses:  None    ED Discharge Orders     None

## 2024-05-24 NOTE — ED Triage Notes (Signed)
 Pt reports generally feeling bad x 2 wks; c/o fatigue, weakness, difficulty sleeping; has LT nephrostomy (placed in April); gets Keytruda  infusions

## 2024-05-25 ENCOUNTER — Emergency Department (HOSPITAL_BASED_OUTPATIENT_CLINIC_OR_DEPARTMENT_OTHER)

## 2024-05-25 DIAGNOSIS — R590 Localized enlarged lymph nodes: Secondary | ICD-10-CM | POA: Diagnosis present

## 2024-05-25 DIAGNOSIS — E1165 Type 2 diabetes mellitus with hyperglycemia: Secondary | ICD-10-CM | POA: Diagnosis present

## 2024-05-25 DIAGNOSIS — Z8551 Personal history of malignant neoplasm of bladder: Secondary | ICD-10-CM | POA: Diagnosis not present

## 2024-05-25 DIAGNOSIS — I5032 Chronic diastolic (congestive) heart failure: Secondary | ICD-10-CM | POA: Diagnosis present

## 2024-05-25 DIAGNOSIS — N179 Acute kidney failure, unspecified: Secondary | ICD-10-CM | POA: Diagnosis present

## 2024-05-25 DIAGNOSIS — Z79899 Other long term (current) drug therapy: Secondary | ICD-10-CM | POA: Diagnosis not present

## 2024-05-25 DIAGNOSIS — I4821 Permanent atrial fibrillation: Secondary | ICD-10-CM | POA: Diagnosis present

## 2024-05-25 DIAGNOSIS — E1122 Type 2 diabetes mellitus with diabetic chronic kidney disease: Secondary | ICD-10-CM | POA: Diagnosis present

## 2024-05-25 DIAGNOSIS — N184 Chronic kidney disease, stage 4 (severe): Secondary | ICD-10-CM | POA: Diagnosis present

## 2024-05-25 DIAGNOSIS — N390 Urinary tract infection, site not specified: Secondary | ICD-10-CM | POA: Diagnosis not present

## 2024-05-25 DIAGNOSIS — R531 Weakness: Secondary | ICD-10-CM

## 2024-05-25 DIAGNOSIS — I2510 Atherosclerotic heart disease of native coronary artery without angina pectoris: Secondary | ICD-10-CM | POA: Diagnosis present

## 2024-05-25 DIAGNOSIS — Z7901 Long term (current) use of anticoagulants: Secondary | ICD-10-CM | POA: Diagnosis not present

## 2024-05-25 DIAGNOSIS — I130 Hypertensive heart and chronic kidney disease with heart failure and stage 1 through stage 4 chronic kidney disease, or unspecified chronic kidney disease: Secondary | ICD-10-CM | POA: Diagnosis present

## 2024-05-25 DIAGNOSIS — E860 Dehydration: Secondary | ICD-10-CM | POA: Diagnosis present

## 2024-05-25 DIAGNOSIS — Z8249 Family history of ischemic heart disease and other diseases of the circulatory system: Secondary | ICD-10-CM | POA: Diagnosis not present

## 2024-05-25 DIAGNOSIS — E785 Hyperlipidemia, unspecified: Secondary | ICD-10-CM | POA: Diagnosis present

## 2024-05-25 DIAGNOSIS — K5900 Constipation, unspecified: Secondary | ICD-10-CM | POA: Diagnosis present

## 2024-05-25 DIAGNOSIS — Z1152 Encounter for screening for COVID-19: Secondary | ICD-10-CM | POA: Diagnosis not present

## 2024-05-25 DIAGNOSIS — I10 Essential (primary) hypertension: Secondary | ICD-10-CM | POA: Diagnosis not present

## 2024-05-25 DIAGNOSIS — Z936 Other artificial openings of urinary tract status: Secondary | ICD-10-CM | POA: Diagnosis not present

## 2024-05-25 DIAGNOSIS — N12 Tubulo-interstitial nephritis, not specified as acute or chronic: Secondary | ICD-10-CM | POA: Diagnosis present

## 2024-05-25 DIAGNOSIS — Z8616 Personal history of COVID-19: Secondary | ICD-10-CM | POA: Diagnosis not present

## 2024-05-25 DIAGNOSIS — C689 Malignant neoplasm of urinary organ, unspecified: Secondary | ICD-10-CM | POA: Diagnosis present

## 2024-05-25 DIAGNOSIS — E871 Hypo-osmolality and hyponatremia: Secondary | ICD-10-CM | POA: Diagnosis not present

## 2024-05-25 DIAGNOSIS — Z905 Acquired absence of kidney: Secondary | ICD-10-CM | POA: Diagnosis not present

## 2024-05-25 DIAGNOSIS — Z7982 Long term (current) use of aspirin: Secondary | ICD-10-CM | POA: Diagnosis not present

## 2024-05-25 LAB — URINALYSIS, W/ REFLEX TO CULTURE (INFECTION SUSPECTED)
Bilirubin Urine: NEGATIVE
Glucose, UA: NEGATIVE mg/dL
Ketones, ur: NEGATIVE mg/dL
Nitrite: POSITIVE — AB
Protein, ur: 100 mg/dL — AB
Specific Gravity, Urine: 1.01 (ref 1.005–1.030)
WBC, UA: >50 WBC/hpf (ref 0–5)
pH: 6 (ref 5.0–8.0)

## 2024-05-25 LAB — LACTIC ACID, PLASMA
Lactic Acid, Venous: 0.8 mmol/L (ref 0.5–1.9)
Lactic Acid, Venous: 1 mmol/L (ref 0.5–1.9)

## 2024-05-25 LAB — GLUCOSE, CAPILLARY
Glucose-Capillary: 166 mg/dL — ABNORMAL HIGH (ref 70–99)
Glucose-Capillary: 175 mg/dL — ABNORMAL HIGH (ref 70–99)
Glucose-Capillary: 123 mg/dL — ABNORMAL HIGH (ref 70–99)
Glucose-Capillary: 147 mg/dL — ABNORMAL HIGH (ref 70–99)
Glucose-Capillary: 217 mg/dL — ABNORMAL HIGH (ref 70–99)

## 2024-05-25 MED ORDER — ACETAMINOPHEN 650 MG RE SUPP: 650.0000 mg | Freq: Four times a day (QID) | RECTAL | Status: DC | PRN

## 2024-05-25 MED ORDER — IRBESARTAN 300 MG PO TABS
300.0000 mg | ORAL_TABLET | Freq: Every day | ORAL | Status: DC
  Administered 2024-05-25 – 2024-05-26 (×2): 300 mg via ORAL
  Filled 2024-05-25 (×2): qty 1

## 2024-05-25 MED ORDER — FENTANYL CITRATE PF 50 MCG/ML IJ SOSY
50.0000 ug | PREFILLED_SYRINGE | Freq: Once | INTRAMUSCULAR | Status: AC
  Administered 2024-05-25: 50 ug via INTRAVENOUS
  Filled 2024-05-25: qty 1

## 2024-05-25 MED ORDER — ROSUVASTATIN CALCIUM 20 MG PO TABS
40.0000 mg | ORAL_TABLET | Freq: Every day | ORAL | Status: DC
  Administered 2024-05-25 – 2024-06-03 (×10): 40 mg via ORAL
  Filled 2024-05-25 (×10): qty 2

## 2024-05-25 MED ORDER — ISOSORBIDE MONONITRATE ER 30 MG PO TB24
30.0000 mg | ORAL_TABLET | Freq: Every day | ORAL | Status: DC
  Administered 2024-05-25 – 2024-05-26 (×2): 30 mg via ORAL
  Filled 2024-05-25 (×2): qty 1

## 2024-05-25 MED ORDER — TAMSULOSIN HCL 0.4 MG PO CAPS
0.4000 mg | ORAL_CAPSULE | Freq: Every day | ORAL | Status: DC
  Administered 2024-05-25 – 2024-06-03 (×10): 0.4 mg via ORAL
  Filled 2024-05-25 (×10): qty 1

## 2024-05-25 MED ORDER — AMLODIPINE BESYLATE 5 MG PO TABS
5.0000 mg | ORAL_TABLET | Freq: Every day | ORAL | Status: DC
  Administered 2024-05-25 – 2024-05-27 (×3): 5 mg via ORAL
  Filled 2024-05-25 (×3): qty 1

## 2024-05-25 MED ORDER — MORPHINE SULFATE (PF) 4 MG/ML IV SOLN
4.0000 mg | Freq: Once | INTRAVENOUS | Status: AC
  Administered 2024-05-25: 4 mg via INTRAVENOUS
  Filled 2024-05-25: qty 1

## 2024-05-25 MED ORDER — INSULIN ASPART 100 UNIT/ML IJ SOLN
0.0000 [IU] | Freq: Three times a day (TID) | INTRAMUSCULAR | Status: DC
  Administered 2024-05-25: 1 [IU] via SUBCUTANEOUS
  Administered 2024-05-26: 3 [IU] via SUBCUTANEOUS
  Administered 2024-05-26: 1 [IU] via SUBCUTANEOUS
  Administered 2024-05-27: 2 [IU] via SUBCUTANEOUS
  Administered 2024-05-27: 1 [IU] via SUBCUTANEOUS
  Administered 2024-05-27: 3 [IU] via SUBCUTANEOUS
  Administered 2024-05-28 (×3): 1 [IU] via SUBCUTANEOUS
  Administered 2024-05-29: 2 [IU] via SUBCUTANEOUS
  Administered 2024-05-29: 3 [IU] via SUBCUTANEOUS
  Administered 2024-05-30 (×2): 1 [IU] via SUBCUTANEOUS
  Administered 2024-05-31: 3 [IU] via SUBCUTANEOUS
  Administered 2024-06-01 (×2): 1 [IU] via SUBCUTANEOUS
  Administered 2024-06-02: 2 [IU] via SUBCUTANEOUS
  Administered 2024-06-03 (×2): 1 [IU] via SUBCUTANEOUS

## 2024-05-25 MED ORDER — METOLAZONE 2.5 MG PO TABS
2.5000 mg | ORAL_TABLET | Freq: Every day | ORAL | Status: DC
  Administered 2024-05-25 – 2024-05-26 (×2): 2.5 mg via ORAL
  Filled 2024-05-25 (×2): qty 1

## 2024-05-25 MED ORDER — ONDANSETRON HCL 4 MG PO TABS: 4.0000 mg | ORAL_TABLET | Freq: Four times a day (QID) | ORAL | Status: DC | PRN

## 2024-05-25 MED ORDER — HYDROMORPHONE HCL 1 MG/ML IJ SOLN
0.5000 mg | INTRAMUSCULAR | Status: DC | PRN
  Administered 2024-05-25 – 2024-06-02 (×7): 0.5 mg via INTRAVENOUS
  Filled 2024-05-25 (×7): qty 0.5

## 2024-05-25 MED ORDER — TRAZODONE HCL 50 MG PO TABS: 50.0000 mg | ORAL_TABLET | Freq: Every day | ORAL | Status: DC

## 2024-05-25 MED ORDER — ASPIRIN 81 MG PO TBEC
81.0000 mg | DELAYED_RELEASE_TABLET | Freq: Every day | ORAL | Status: DC
  Administered 2024-05-25 – 2024-05-26 (×2): 81 mg via ORAL
  Filled 2024-05-25 (×2): qty 1

## 2024-05-25 MED ORDER — FUROSEMIDE 20 MG PO TABS
20.0000 mg | ORAL_TABLET | Freq: Every day | ORAL | Status: DC
  Administered 2024-05-25 – 2024-05-26 (×2): 20 mg via ORAL
  Filled 2024-05-25 (×2): qty 1

## 2024-05-25 MED ORDER — SODIUM CHLORIDE 0.9 % IV SOLN
2.0000 g | Freq: Two times a day (BID) | INTRAVENOUS | Status: DC
  Administered 2024-05-25 – 2024-05-27 (×5): 2 g via INTRAVENOUS
  Filled 2024-05-25 (×5): qty 12.5

## 2024-05-25 MED ORDER — ACETAMINOPHEN 325 MG PO TABS
650.0000 mg | ORAL_TABLET | Freq: Four times a day (QID) | ORAL | Status: DC | PRN
  Administered 2024-05-31 – 2024-06-01 (×3): 650 mg via ORAL
  Filled 2024-05-25 (×3): qty 2

## 2024-05-25 MED ORDER — SODIUM BICARBONATE 650 MG PO TABS
650.0000 mg | ORAL_TABLET | Freq: Two times a day (BID) | ORAL | Status: DC
  Administered 2024-05-25 – 2024-06-03 (×19): 650 mg via ORAL
  Filled 2024-05-25 (×19): qty 1

## 2024-05-25 MED ORDER — ONDANSETRON HCL 4 MG/2ML IJ SOLN
4.0000 mg | Freq: Four times a day (QID) | INTRAMUSCULAR | Status: DC | PRN
  Administered 2024-05-30 – 2024-06-02 (×2): 4 mg via INTRAVENOUS
  Filled 2024-05-25 (×2): qty 2

## 2024-05-25 MED ORDER — INSULIN ASPART 100 UNIT/ML IJ SOLN
0.0000 [IU] | Freq: Every day | INTRAMUSCULAR | Status: DC
  Administered 2024-05-25: 2 [IU] via SUBCUTANEOUS
  Administered 2024-05-26: 4 [IU] via SUBCUTANEOUS
  Administered 2024-05-27 – 2024-06-01 (×4): 2 [IU] via SUBCUTANEOUS

## 2024-05-25 MED ORDER — MIRTAZAPINE 30 MG PO TBDP
30.0000 mg | ORAL_TABLET | Freq: Every day | ORAL | Status: DC
  Administered 2024-05-25 – 2024-06-02 (×9): 30 mg via ORAL
  Filled 2024-05-25 (×9): qty 1

## 2024-05-25 MED ORDER — OXYCODONE HCL 5 MG PO TABS
5.0000 mg | ORAL_TABLET | ORAL | Status: DC | PRN
  Administered 2024-05-25 – 2024-06-03 (×19): 5 mg via ORAL
  Filled 2024-05-25 (×22): qty 1

## 2024-05-25 MED ORDER — ACETAMINOPHEN 325 MG PO TABS: 650.0000 mg | ORAL_TABLET | Freq: Four times a day (QID) | ORAL | Status: DC | PRN

## 2024-05-25 MED ORDER — HYDROXYZINE HCL 25 MG PO TABS
25.0000 mg | ORAL_TABLET | Freq: Three times a day (TID) | ORAL | Status: DC | PRN
  Administered 2024-05-28 – 2024-06-02 (×4): 25 mg via ORAL
  Filled 2024-05-25 (×4): qty 1

## 2024-05-25 MED ORDER — HYDROXYZINE PAMOATE 25 MG PO CAPS: 25.0000 mg | ORAL_CAPSULE | Freq: Three times a day (TID) | ORAL | Status: DC | PRN

## 2024-05-25 MED ORDER — METOPROLOL TARTRATE 25 MG PO TABS
25.0000 mg | ORAL_TABLET | Freq: Two times a day (BID) | ORAL | Status: DC
Start: 2024-05-25 — End: 2024-05-27
  Administered 2024-05-25 – 2024-05-27 (×5): 25 mg via ORAL
  Filled 2024-05-25 (×5): qty 1

## 2024-05-25 MED ORDER — PIPERACILLIN-TAZOBACTAM 3.375 G IVPB 30 MIN
3.3750 g | Freq: Once | INTRAVENOUS | Status: AC
  Administered 2024-05-25: 3.375 g via INTRAVENOUS
  Filled 2024-05-25: qty 50

## 2024-05-25 NOTE — H&P (Signed)
 History and Physical    Patient: Adam Harris FMW:[id number] DOB: 05/08/1946 DOA: 05/24/2024 DOS: the patient was seen and examined on 05/25/2024 PCP: [name], MD  Patient coming from: Home  Chief Complaint: Generalized weakness, fatigue, nausea and emesis.  HPI: Adam Harris is a 78 y.o. male with medical history significant of atrial fibrillation on apixaban , anxiety, arthritis, CAD, NSTEMI, CHF with preserved EF, COVID-19, hematuria, urothelial cancer status post nephrostomy, acquired solitary kidney, stage IV CKD, prostate cancer, fatty liver, hyperlipidemia, hypertension, herpes zoster, thoracic aortic atherosclerosis, type 2 diabetes who presented to the emergency department with complaints of generalized weakness, fatigue, decreased appetite, lower abdominal pain associated with nausea, but no emesis. Pain is very significant at the nephrostomy tube site. Positive flank pain, negative dysuria, frequency or hematuria. He denied fever, chills, rhinorrhea, sore throat, wheezing or hemoptysis. No chest pain, palpitations, diaphoresis, PND, orthopnea or pitting edema of the lower extremities. No emesis, diarrhea, melena or hematochezia. He gets occasional constipation. No polyuria, polydipsia, polyphagia or blurred vision.   Lab work: Urinalysis was cloudy with moderate hemoglobin positive nitrites and moderate leukocyte esterase.  There was protein 500 mg/dL.  Greater than 50 WBC, 11-20 RBC, many bacteria and WBC clumps.  CBC showed white count 15.3, hemoglobin 10.8 g/dL and platelets 681.  Coronavirus, influenza and RSV PCR was negative.  Lactic acid x 2 was normal.  CMP showed normal electrolytes after sodium and calcium  correction.  Glucose 210, BUN 52 and creatinine 2.05 mg/dL, which is similar to the patient's usual baseline renal function.  LFTs were normal with the exception of an albumin  of 3.2 g/dL and alkaline phosphatase of 128.  Imaging: CT head without contrast no evidence of acute  intercranial abnormality.  CT abdomen/pelvis with contrast showing bladder wall thickening diffuse perivesical fat stranding and mild perinephric stranding about the mid and distal ureter.  Differential considerations include progression of bladder cancer versus infection.  New and increased retroperitoneal, iliac chain and perirectal lymph nodes when compared to MRI from 03/31/2023 suggesting metastatic disease.   ED course: Initial vital signs within 99.8 F, pulse 90, respiration 22, BP 133/83 mmHg O2 sat 100% on room air.  The patient received fentanyl  50 mcg IVP x 2, morphine  4 mg IVP x 1, ondansetron  4 mg IVP x 1, Zosyn  3.375 g IVPB and 500 mL of normal saline bolus.  Review of Systems: As mentioned in the history of present illness. All other systems reviewed and are negative. Past Medical History:  Diagnosis Date   Abnormal radiologic findings on diagnostic imaging of renal pelvis, ureter, or bladder    bilateral ureter abnormalities   Anticoagulant long-term use    eliquis    Anxiety    pt denies   Arthritis    Atrial fibrillation, chronic (HCC)    CAD (coronary artery disease) cardiologist-- dr hochrein   NSTEMI 02-04-2014  per cardiac cath chronic occluded RCA w/ faint left-to-right collaterals and aneurysmal LCFx with sluggish coronary flow/   NSTEMI --11-21-2016 per cardiac cath occluded proximal RCA & mid to diastal CFX 100%, med rx. If that does not work, PTCA or CABG   Cancer [facility])    bladder and kidney   CHF (congestive heart failure) (HCC)    CKD (chronic kidney disease), stage III (HCC)    patient unaware   DOE (dyspnea on exertion)    Fatty liver    pt denies   Hematuria 02/2019   History of COVID-19 10/2019   History of non-ST elevation  myocardial infarction (NSTEMI)    02-04-2014  and 11-21-2016  cardiac cath done both times ,  medically management   History of shingles 12/2017   slight pain and numbness still noted in the area   Hyperlipidemia    Hypertension     Insomnia    Myocardial infarction [facility]) 2015   Persistent atrial fibrillation [facility])    cardiologsit-- dr hochrein   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 Kennyth   Thoracic aortic atherosclerosis [facility])    Type 2 diabetes mellitus (HCC)    Urinary frequency    Past Surgical History:  Procedure Laterality Date   CARDIAC CATHETERIZATION N/A 11/21/2016   Procedure: Left Heart Cath and Coronary Angiography;  Surgeon: Debby DELENA Sor, MD;  Location: [facility] INVASIVE CV LAB;  Service: Cardiovascular;  Laterality: N/A;  pRCA 100% , ostial LAD 45%, OM3 80%, mCFX to dCFX 100% (AV groove), lateral OM3 50%   COLONOSCOPY     CYSTOSCOPY WITH BIOPSY N/A 08/12/2020   Procedure: CYSTOSCOPY WITH BLADDER BIOPSY AND TRANSURETHRAL RESECTION OF BLADDER TUMOR;  Surgeon: [name], MD;  Location: [facility] ORS;  Service: Urology;  Laterality: N/A;   CYSTOSCOPY WITH BIOPSY N/A 11/10/2021   Procedure: CYSTOSCOPY WITH BLADDER BIOPSIES/ LEFT RETROGRADE;  Surgeon: [name], MD;  Location: [facility];  Service: Urology;  Laterality: N/A;   CYSTOSCOPY WITH BIOPSY Left 07/27/2022   Procedure: CYSTOSCOPY WITH  BLADDER BIOPSY, TRANSURETHRAL FULGERATION OF BLADDER, EXAM UNDER ANESTHESIA, LEFT RETROGRADE PYELOGRAM;  Surgeon: [name], MD;  Location: [facility];  Service: Urology;  Laterality: Left;   CYSTOSCOPY WITH BIOPSY N/A 04/23/2023   Procedure: CYSTOSCOPY WITH BLADDER AND PROSTATIC URETHRAL BIOPSIES;  Surgeon: [name], MD;  Location: [facility];  Service: Urology;  Laterality: N/A;  60 MINUTES NEEDED FOR CASE   CYSTOSCOPY WITH BIOPSY N/A 03/06/2024   Procedure: CYSTOSCOPY, WITH BIOPSY;  Surgeon: [name], MD;  Location: [facility];  Service: Urology;  Laterality: N/A;  CYSTOSCOPY WITH BLADDER BIOPSIES   CYSTOSCOPY WITH FULGERATION N/A 03/06/2024   Procedure: CYSTOSCOPY, WITH BLADDER FULGURATION, TURBT;  Surgeon: [name], MD;  Location: [facility];   Service: Urology;  Laterality: N/A;   CYSTOSCOPY WITH URETEROSCOPY AND STENT PLACEMENT Right 12/15/2019   Procedure: CYSTOSCOPY WITH RIGHT RETROGRADE/ RIGHT URETEROSCOPY/ BIOPSY;  Surgeon: Ottelin, Mark, MD;  Location: [facility] [place];  Service: Urology;  Laterality: Right;   CYSTOSCOPY/RETROGRADE/URETEROSCOPY Bilateral 03/18/2018   Procedure: CYSTOSCOPY/RETROGRADE/URETEROSCOPY.;  Surgeon: Ottelin, Mark, MD;  Location: [facility];  Service: Urology;  Laterality: Bilateral;   EYE SURGERY Bilateral    cateract in January 2023   HYDROCELE EXCISION Left 07/27/2022   Procedure: HYDROCELE REPAIR;  Surgeon: [name], MD;  Location: [facility];  Service: Urology;  Laterality: Left;  GENERAL ANESTHESIA WITH PARALYSIS   IR NEPHROSTOMY EXCHANGE LEFT  03/10/2024   IR NEPHROSTOMY EXCHANGE LEFT  04/28/2024   IR NEPHROSTOMY PLACEMENT LEFT  03/07/2024   KNEE ARTHROSCOPY Right    LEFT ATRIAL APPENDAGE OCCLUSION N/A 12/13/2023   Procedure: LEFT ATRIAL APPENDAGE OCCLUSION;  Surgeon: [name], MD;  Location: [facility] INVASIVE CV LAB;  Service: Cardiovascular;  Laterality: N/A;   LEFT HEART CATHETERIZATION WITH CORONARY ANGIOGRAM N/A 02/04/2014   Procedure: LEFT HEART CATHETERIZATION WITH CORONARY ANGIOGRAM;  Surgeon: Deatrice DELENA Cage, MD;  Location: [facility] CATH LAB;  Service: Cardiovascular;  Laterality: N/A;  severe one-vessel CAD, chronically occluded RCA with faint left-to-right collaterals;  aneurysmal LCFx  with sluggish coronary flow;  normal LVSF w/ moderately elevated LVEDP (ostialOM2 20%, pOM3 20%, pD1 20%, mCFX 50%, diffuse 20% pCFX)   PROSTATE BIOPSY N/A 08/12/2020   Procedure: BIOPSY TRANSRECTAL ULTRASONIC PROSTATE (TUBP);  Surgeon: [name], MD;  Location: [facility];  Service: Urology;  Laterality: N/A;   ROBOT ASSITED LAPAROSCOPIC NEPHROURETERECTOMY Right 03/22/2020   Procedure: XI ROBOT ASSITED LAPAROSCOPIC NEPHROURETERECTOMY;  Surgeon: [name], MD;  Location: [facility];   Service: Urology;  Laterality: Right;   TRANSESOPHAGEAL ECHOCARDIOGRAM (CATH LAB) N/A 12/13/2023   Procedure: TRANSESOPHAGEAL ECHOCARDIOGRAM;  Surgeon: [name], MD;  Location: [facility] INVASIVE CV LAB;  Service: Cardiovascular;  Laterality: N/A;   TRANSESOPHAGEAL ECHOCARDIOGRAM (CATH LAB) N/A 02/11/2024   Procedure: TRANSESOPHAGEAL ECHOCARDIOGRAM;  Surgeon: Barbaraann Darryle Ned, MD;  Location: [facility] INVASIVE CV LAB;  Service: Cardiovascular;  Laterality: N/A;   TRANSTHORACIC ECHOCARDIOGRAM  11-22-2016   dr hochrein   ef 55-60%/ mild MR and TR/ moderate LAE   TRANSURETHRAL RESECTION OF BLADDER TUMOR N/A 02/10/2021   Procedure: TRANSURETHRAL RESECTION OF BLADDER TUMOR (TURBT)/ CYSTOSCOPY/ LEFT RETROGRADE;  Surgeon: [name], MD;  Location: [facility];  Service: Urology;  Laterality: N/A;  GENERAL ANESTHESIA WITH PARALYSIS   Social History:  reports that he quit smoking about 53 years ago. His smoking use included cigarettes. He started smoking about 57 years ago. He has a 1 pack-year smoking history. He has been exposed to tobacco smoke. He quit smokeless tobacco use about 53 years ago.  His smokeless tobacco use included chew. He reports that he does not drink alcohol and does not use drugs.  Allergies  Allergen Reactions   Xarelto  [Rivaroxaban ] Other (See Comments)    Skin Blisters     Family History  Problem Relation Age of Onset   Hypertension Mother    Cancer Mother        anal cancer   Esophageal cancer Neg Hx    Pancreatic cancer Neg Hx    Stomach cancer Neg Hx    Liver disease Neg Hx     Prior to Admission medications   Medication Sig Start Date End Date Taking? Authorizing Provider  acetaminophen -codeine  (TYLENOL  #3) 300-30 MG tablet Take 1 tablet by mouth every 8 (eight) hours as needed for moderate pain (pain score 4-6). 05/01/24   Burton, Lacie K, NP  amLODipine  (NORVASC ) 5 MG tablet TAKE 1 TABLET (5 MG TOTAL) BY MOUTH DAILY 12/05/23   [name], MD  aspirin  EC 81 MG  tablet Take 1 tablet (81 mg total) by mouth daily. Swallow whole. 03/13/24   [name], MD  Cholecalciferol  (VITAMIN D3) 1000 units CAPS Take 1,000 Units by mouth daily.    [provider]  furosemide  (LASIX ) 20 MG tablet Take 1 tablet (20 mg total) by mouth daily. For the next 3 days take 40 mg (2 tabs) daily then resume one tab daily. 02/15/24   [name], MD  hydrOXYzine  (VISTARIL ) 25 MG capsule Take 1 capsule (25 mg total) by mouth every 8 (eight) hours as needed. 05/11/23   [name], MD  isosorbide  mononitrate (IMDUR ) 30 MG 24 hr tablet TAKE ONE TABLET BY MOUTH DAILY 08/24/23   [name], MD  metolazone  (ZAROXOLYN ) 2.5 MG tablet Take 1 tablet (2.5 mg total) by mouth daily. 05/06/24   [name], MD  metoprolol  tartrate (LOPRESSOR ) 25 MG tablet TAKE ONE TABLET BY MOUTH TWICE A DAY 06/11/23   [name], MD  nitroGLYCERIN  (NITROSTAT ) 0.4 MG SL tablet TAKE ONE  TABLET UNDER THE TONGUE EVERY 5 MINS FOR THREE DOSES AS NEEDED FOR CHEST PAIN. CALL 911 IF 2ND DOSE DOESN'T HELP 04/10/24   [name], MD  olmesartan  (BENICAR ) 40 MG tablet Take 1 tablet (40 mg total) by mouth daily. Patient taking differently: Take 20 mg by mouth daily. 04/18/24   [name], MD  ondansetron  (ZOFRAN ) 8 MG tablet Take 1 tablet (8 mg total) by mouth every 8 (eight) hours as needed for nausea or vomiting. 05/01/24   Burton, Lacie K, NP  Pembrolizumab  (KEYTRUDA  IV) Inject into the vein every 14 (fourteen) days.    [provider]  rosuvastatin  (CRESTOR ) 40 MG tablet TAKE 1 TABLET (40 MG TOTAL) BY MOUTH EVERY MORNING. PLEASE KEEP SCHEDULED APPOINTMENT 08/24/23   [name], MD  sitaGLIPtin  (JANUVIA ) 50 MG tablet Take 1 tablet (50 mg total) by mouth daily. 05/19/24   [name], MD  sodium bicarbonate  650 MG tablet Take 650 mg by mouth 2 (two) times daily.    [provider]  tamsulosin  (FLOMAX ) 0.4 MG CAPS capsule TAKE ONE CAPSULE BY MOUTH DAILY 02/22/24   [name], MD  traZODone  (DESYREL ) 50 MG tablet Take 0.5-1 tablets (25-50 mg total) by mouth at bedtime as needed for sleep. 10/16/23   [name], MD    Physical Exam: Vitals:   05/25/24 9562 05/25/24 0515 05/25/24 0602 05/25/24 0607  BP:  133/89  (!) 141/94  Pulse:  68  78  Resp:    20  Temp: 97.7 F (36.5 C)   97.8 F (36.6 C)  TempSrc: Oral   Oral  SpO2:  95%  98%  Weight:   89.6 kg   Height:       Physical Exam Vitals and nursing note reviewed.  Constitutional:      General: He is awake. He is not in acute distress.    Appearance: Normal appearance. He is ill-appearing.  HENT:     Head: Normocephalic.     Nose: No rhinorrhea.     Mouth/Throat:     Mouth: Mucous membranes are dry.  Eyes:     General: No scleral icterus.    Pupils: Pupils are equal, round, and reactive to light.  Neck:     Vascular: No JVD.  Cardiovascular:     Rate and Rhythm: Normal rate and regular rhythm.     Heart sounds: S1 normal and S2 normal.  Pulmonary:     Effort: Pulmonary effort is normal.     Breath sounds: Normal breath sounds. No wheezing, rhonchi or rales.  Abdominal:     General: Bowel sounds are normal. There is no distension.     Palpations: Abdomen is soft.     Tenderness: There is no abdominal tenderness. There is left CVA tenderness. There is no right CVA tenderness.  Musculoskeletal:     Cervical back: Neck supple.     Right lower leg: No edema.     Left lower leg: No edema.  Skin:    General: Skin is warm and dry.  Neurological:     General: No focal deficit present.     Mental Status: He is alert and oriented to person, place, and time.  Psychiatric:        Mood and Affect: Mood normal.        Behavior: Behavior normal. Behavior is cooperative.    Data Reviewed:  Results are pending, will review when available. 02/11/2024 TEE report. IMPRESSIONS:   1. 27  mm Watchman FLX is LAA. No device leak or device-related thrombus.  Left atrial size was severely  dilated. No left atrial/left atrial  appendage thrombus was detected.   2. Left ventricular ejection fraction, by estimation, is 60 to 65%. The  left ventricle has normal function. The left ventricle has no regional  wall motion abnormalities.   3. Right ventricular systolic function is normal. The right ventricular  size is normal.   4. Right atrial size was mild to moderately dilated.   5. The mitral valve is grossly normal. Mild to moderate mitral valve  regurgitation. No evidence of mitral stenosis.   6. The aortic valve is tricuspid. Aortic valve regurgitation is trivial.  No aortic stenosis is present.   7. There is mild (Grade II) protruding plaque involving the descending  aorta and aortic arch.   8. 3D performed of the LAA and demonstrates 3D LAA assessment of Watchman  device. Normal.   EKG: Vent. rate 81 BPM PR interval * ms QRS duration 92 ms QT/QTcB 369/429 ms P-R-T axes * -6 128 Atrial fibrillation RSR' in V1 or V2, right VCD or RVH Nonspecific T abnormalities, lateral leads  Assessment and Plan: Principal Problem:   Generalized weakness In the setting of:   Complicated UTI (urinary tract infection) With history of solitary kidney and:   Nephrostomy present (HCC) Admit to telemetry/inpatient. Continue cefepime  2 g every 12 hours.   Analgesics as needed. Antiemetics as needed. Follow-up urine culture and sensitivity Follow CBC and CMP in a.m.  Active Problems:   CKD (chronic kidney disease), stage IV (HCC) Monitor renal function and electrolytes. Continue sodium bicarbonate  650 mg po BID.    Essential hypertension Continue amlodipine  5 mg p.o. daily Continue furosemide  20 mg p.o. daily. Continue metolazone  2.5 mg p.o. daily. Continue ibersartan 300 mg p.o. daily. Continue metoprolol  tartrate 25 mg p.o. twice daily.    Chronic diastolic CHF  (congestive heart failure), NYHA class 2 (HCC) Continue diuretics as above. Continue ACE inhibitor and  beta-blocker.    Permanent atrial fibrillation (HCC) CHA?DS?-VASc Score of at least 6. Not on anticoagulation. Continue metoprolol  for rate control.    Hyperlipidemia Continue rosuvastatin  40 mg p.o. daily.    Type 2 diabetes mellitus with hyperglycemia (HCC) Carbohydrate modified diet. CBG monitoring with RI SS. Check hemoglobin A1c.     Advance Care Planning:   Code Status: Full Code   Consults: Urology Bard Burnet, MD)  Family Communication:   Severity of Illness: The appropriate patient status for this patient is INPATIENT. Inpatient status is judged to be reasonable and necessary in order to provide the required intensity of service to ensure the patient's safety. The patient's presenting symptoms, physical exam findings, and initial radiographic and laboratory data in the context of their chronic comorbidities is felt to place them at high risk for further clinical deterioration. Furthermore, it is not anticipated that the patient will be medically stable for discharge from the hospital within 2 midnights of admission.   * I certify that at the point of admission it is my clinical judgment that the patient will require inpatient hospital care spanning beyond 2 midnights from the point of admission due to high intensity of service, high risk for further deterioration and high frequency of surveillance required.*  Author: Alm Dorn Castor, MD 05/25/2024 7:37 AM  For on call review [email or web address].   This document was prepared using Dragon voice recognition software and may contain some unintended transcription errors.

## 2024-05-25 NOTE — Progress Notes (Signed)
 Patient arrived to the floor brought in via carelink from [facility]. Patient has 2/10 pain. Medication given prior to transport. Patient resting comfortably in bed, non-skid socks applied as well as SCD's.

## 2024-05-25 NOTE — Assessment & Plan Note (Deleted)
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  He has had multiple BCG maintenance therapy, is BCG refractory. -Given his BCG refractory disease, recurrence in bladder and the urethra, I recommend systemic therapy with Keytruda every 3 weeks (or every 6 weeks if he tolerates well) for 2 years, per NCCN guideline.  He had no history of autoimmune disease, overall in good health, there is no contraindication for immunotherapy. --he started on 06/15/2023. C4 was postponed due to dyspnea and hypoxia on exertion, CT chest was negative for pneumonitis  -repeated cystoscopy and cytology was still positive in early Nov 2024. I discussed with Dr. Laverle Patter and we decide continue Keytruda for additional 3 months and re-evaluate

## 2024-05-25 NOTE — Consult Note (Signed)
 Urology Consult   I have been asked to see the patient by Dr. Celinda, for evaluation and management of left flank pain, solitary left kidney, history of urothelial cell carcinoma.  Chief Complaint: Left flank pain  HPI:  Adam Harris is a 78 y.o. 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  He was admitted overnight for abdominal pain and left-sided flank pain for the last 2 to 3 weeks.  CT showed solitary left kidney with nephrostomy tube in place, no hydronephrosis, significant retroperitoneal lymphadenopathy.  Urinalysis appeared infected and he was started on antibiotics.  He underwent a right robotic laparoscopic nephroureterectomy with Dr. Renda in May 2021.  He had a complicated hospitalization in April 2025 with bladder biopsy(negative), gross hematuria requiring transfusion and takeback to the OR, and ultimately left kidney managed with left-sided nephrostomy tube that has continued to be exchanged.  He has been on Keytruda  through oncology for BCG refractory recurrent urothelial cell carcinoma.  He reports over the last 2 weeks he has had nausea, abdominal pain, left flank pain.  He denies any gross hematuria from his nephrostomy tube and it has been draining well.  He voids a very minimal amount of light brown-colored urine from below.  PMH: Past Medical History:  Diagnosis Date   Abnormal radiologic findings on diagnostic imaging of renal pelvis, ureter, or bladder    bilateral ureter abnormalities   Anticoagulant long-term use    eliquis    Anxiety    pt denies   Arthritis    Atrial fibrillation, chronic (HCC)    CAD (coronary artery disease) cardiologist-- dr hochrein   NSTEMI 02-04-2014  per cardiac cath chronic occluded RCA w/ faint left-to-right collaterals and aneurysmal LCFx with sluggish coronary flow/   NSTEMI --11-21-2016 per cardiac cath occluded proximal RCA & mid to diastal CFX 100%, med rx. If that does not work, PTCA or CABG   Cancer [facility])     bladder and kidney   CHF (congestive heart failure) (HCC)    CKD (chronic kidney disease), stage III (HCC)    patient unaware   DOE (dyspnea on exertion)    Fatty liver    pt denies   Hematuria 02/2019   History of COVID-19 10/2019   History of non-ST elevation myocardial infarction (NSTEMI)    02-04-2014  and 11-21-2016  cardiac cath done both times ,  medically management   History of shingles 12/2017   slight pain and numbness still noted in the area   Hyperlipidemia    Hypertension    Insomnia    Myocardial infarction [facility]) 2015   Persistent atrial fibrillation [facility])    cardiologsit-- dr hochrein   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 Kennyth   Thoracic aortic atherosclerosis (HCC)    Type 2 diabetes mellitus (HCC)    Urinary frequency     Surgical History: Past Surgical History:  Procedure Laterality Date   CARDIAC CATHETERIZATION N/A 11/21/2016   Procedure: Left Heart Cath and Coronary Angiography;  Surgeon: Debby DELENA Sor, MD;  Location: [facility] INVASIVE CV LAB;  Service: Cardiovascular;  Laterality: N/A;  pRCA 100% , ostial LAD 45%, OM3 80%, mCFX to dCFX 100% (AV groove), lateral OM3 50%   COLONOSCOPY     CYSTOSCOPY WITH BIOPSY N/A 08/12/2020   Procedure: CYSTOSCOPY WITH BLADDER BIOPSY AND TRANSURETHRAL RESECTION OF BLADDER TUMOR;  Surgeon: [name], MD;  Location: [facility] ORS;  Service: Urology;  Laterality: N/A;   CYSTOSCOPY WITH BIOPSY N/A 11/10/2021   Procedure: CYSTOSCOPY WITH BLADDER BIOPSIES/ LEFT RETROGRADE;  Surgeon: [name], MD;  Location: [facility] ORS;  Service: Urology;  Laterality: N/A;   CYSTOSCOPY WITH BIOPSY Left 07/27/2022   Procedure: CYSTOSCOPY WITH  BLADDER BIOPSY, TRANSURETHRAL FULGERATION OF BLADDER, EXAM UNDER ANESTHESIA, LEFT RETROGRADE PYELOGRAM;  Surgeon: [name], MD;  Location: [facility] ORS;  Service: Urology;  Laterality: Left;   CYSTOSCOPY WITH BIOPSY N/A 04/23/2023    Procedure: CYSTOSCOPY WITH BLADDER AND PROSTATIC URETHRAL BIOPSIES;  Surgeon: [name], MD;  Location: [facility];  Service: Urology;  Laterality: N/A;  60 MINUTES NEEDED FOR CASE   CYSTOSCOPY WITH BIOPSY N/A 03/06/2024   Procedure: CYSTOSCOPY, WITH BIOPSY;  Surgeon: [name], MD;  Location: [facility];  Service: Urology;  Laterality: N/A;  CYSTOSCOPY WITH BLADDER BIOPSIES   CYSTOSCOPY WITH FULGERATION N/A 03/06/2024   Procedure: CYSTOSCOPY, WITH BLADDER FULGURATION, TURBT;  Surgeon: [name], MD;  Location: [facility];  Service: Urology;  Laterality: N/A;   CYSTOSCOPY WITH URETEROSCOPY AND STENT PLACEMENT Right 12/15/2019   Procedure: CYSTOSCOPY WITH RIGHT RETROGRADE/ RIGHT URETEROSCOPY/ BIOPSY;  Surgeon: Ottelin, Mark, MD;  Location: [facility];  Service: Urology;  Laterality: Right;   CYSTOSCOPY/RETROGRADE/URETEROSCOPY Bilateral 03/18/2018   Procedure: CYSTOSCOPY/RETROGRADE/URETEROSCOPY.;  Surgeon: Ottelin, Mark, MD;  Location: [facility];  Service: Urology;  Laterality: Bilateral;   EYE SURGERY Bilateral    cateract in January 2023   HYDROCELE EXCISION Left 07/27/2022   Procedure: HYDROCELE REPAIR;  Surgeon: [name], MD;  Location: [facility];  Service: Urology;  Laterality: Left;  GENERAL ANESTHESIA WITH PARALYSIS   IR NEPHROSTOMY EXCHANGE LEFT  03/10/2024   IR NEPHROSTOMY EXCHANGE LEFT  04/28/2024   IR NEPHROSTOMY PLACEMENT LEFT  03/07/2024   KNEE ARTHROSCOPY Right    LEFT ATRIAL APPENDAGE OCCLUSION N/A 12/13/2023   Procedure: LEFT ATRIAL APPENDAGE OCCLUSION;  Surgeon: [name], MD;  Location: [facility] INVASIVE CV LAB;  Service: Cardiovascular;  Laterality: N/A;   LEFT HEART CATHETERIZATION WITH CORONARY ANGIOGRAM N/A 02/04/2014   Procedure: LEFT HEART CATHETERIZATION WITH CORONARY ANGIOGRAM;  Surgeon: Deatrice DELENA Cage, MD;  Location: [facility] CATH LAB;  Service: Cardiovascular;  Laterality: N/A;  severe one-vessel CAD, chronically occluded RCA with faint  left-to-right collaterals;  aneurysmal LCFx with sluggish coronary flow;  normal LVSF w/ moderately elevated LVEDP (ostialOM2 20%, pOM3 20%, pD1 20%, mCFX 50%, diffuse 20% pCFX)   PROSTATE BIOPSY N/A 08/12/2020   Procedure: BIOPSY TRANSRECTAL ULTRASONIC PROSTATE (TUBP);  Surgeon: [name], MD;  Location: [facility];  Service: Urology;  Laterality: N/A;   ROBOT ASSITED LAPAROSCOPIC NEPHROURETERECTOMY Right 03/22/2020   Procedure: XI ROBOT ASSITED LAPAROSCOPIC NEPHROURETERECTOMY;  Surgeon: [name], MD;  Location: [facility];  Service: Urology;  Laterality: Right;   TRANSESOPHAGEAL ECHOCARDIOGRAM (CATH LAB) N/A 12/13/2023   Procedure: TRANSESOPHAGEAL ECHOCARDIOGRAM;  Surgeon: [name], MD;  Location: [facility] INVASIVE CV LAB;  Service: Cardiovascular;  Laterality: N/A;   TRANSESOPHAGEAL ECHOCARDIOGRAM (CATH LAB) N/A 02/11/2024   Procedure: TRANSESOPHAGEAL ECHOCARDIOGRAM;  Surgeon: Barbaraann Darryle Ned, MD;  Location: [facility] INVASIVE CV LAB;  Service: Cardiovascular;  Laterality: N/A;   TRANSTHORACIC ECHOCARDIOGRAM  11-22-2016   dr hochrein   ef 55-60%/ mild MR and TR/ moderate LAE   TRANSURETHRAL RESECTION OF BLADDER TUMOR N/A 02/10/2021   Procedure: TRANSURETHRAL RESECTION OF BLADDER TUMOR (TURBT)/ CYSTOSCOPY/ LEFT RETROGRADE;  Surgeon: [name], MD;  Location: [facility];  Service: Urology;  Laterality: N/A;  GENERAL ANESTHESIA WITH PARALYSIS  Allergies:  Allergies  Allergen Reactions   Xarelto  [Rivaroxaban ] Other (See Comments)    Skin Blisters     Family History: Family History  Problem Relation Age of Onset   Hypertension Mother    Cancer Mother        anal cancer   Esophageal cancer Neg Hx    Pancreatic cancer Neg Hx    Stomach cancer Neg Hx    Liver disease Neg Hx     Social History:  reports that he quit smoking about 53 years ago. His smoking use included cigarettes. He started smoking about 57 years ago. He has a 1 pack-year smoking history. He has been exposed to  tobacco smoke. He quit smokeless tobacco use about 53 years ago.  His smokeless tobacco use included chew. He reports that he does not drink alcohol and does not use drugs.  ROS: Negative aside from those stated in the HPI.  Physical Exam: BP (!) 141/94 (BP Location: Left Arm)   Pulse 78   Temp 97.8 F (36.6 C) (Oral)   Resp 20   Ht 5' 11 (1.803 m)   Wt 89.6 kg   SpO2 98%   BMI 27.55 kg/m    Constitutional:  Alert and oriented, No acute distress. Cardiovascular: No clubbing, cyanosis, or edema. Respiratory: Normal respiratory effort, no increased work of breathing. GI: Abdomen is soft, nontender, nondistended, no abdominal masses Left nephrostomy tube with clear yellow urine  Laboratory Data: Reviewed, see HPI  Pertinent Imaging: I have personally reviewed the CT scan showing solitary left kidney with nephrostomy tube in place, significant retroperitoneal lymphadenopathy that could represent malignancy versus reactive in setting of infection.  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 Renda in 2021, left kidney has been managed with nephrostomy tube since April 2025.  Admitted 05/25/2024 with abdominal pain, nausea, left flank pain, clinical picture concerning for left-sided pyelonephritis.  Also with significant retroperitoneal lymphadenopathy, this could represent metastatic disease versus reactive in the setting of infection.  Recommendations:  - Continue left nephrostomy tube to drainage - Agree with antibiotics and follow-up cultures - Will alert Dr. Renda and oncology to latest CT to discuss options including repeat imaging in the next few weeks or  consideration of LN biopsy in the near future  Redell JAYSON Burnet, MD  Total time spent on the floor was 65 minutes, with greater than 50% spent in counseling and coordination of care with the patient regarding long history of recurrent urothelial cell carcinoma, solitary left kidney, CT findings with significant lymphadenopathy, possible infection, need for antibiotics.  [facility] [address], [address] [place], [place] [address] [phone]

## 2024-05-25 NOTE — Progress Notes (Signed)
 Hospitalist Transfer Note:    Nursing staff, Please call [facility] Admits & Consults System-Wide number on Amion [phone]) as soon as patient's arrival, so appropriate admitting provider can evaluate the pt.   Transferring facility: [facility] Requesting provider: Dr. Griselda (EDP at [facility]) Reason for transfer: admission for further evaluation and management of generalized weakness, dehydration, intractable pain.    78 year old male with history of urothelial cancer status post right nephrectomy in 2021, and s/p left nephrostomy tube, who follows with both oncology as well as urology, who presented to [facility] ED complaining of pain at the site of his left nephrostomy tube.  He also reports generalized weakness, significant decline in appetite resulting in significant decline in oral intake as well as some recent nausea/vomiting and weight loss.  Vital signs in the ED were notable for the following: Temperature max 99.8.  Labs were notable for CBC which showed white blood cell count 15,300.  Initial lactate 1.0, with repeat trending down to 0.8.  Urinalysis from specimen taken from left nephrostomy tube showed greater than 50 white blood cells, many bacteria, moderate leukocyte Estrace, positive nitrites and no squamous epithelial cells.  Blood cultures x 2 and urine culture were collected prior to initiation of Zosyn .  Imaging notable for CT abd/pelvis w/o contrast shows mild perinephric stranding about the mid and distal ureter, with radiology report conveying differential that includes progression of bladder cancer versus infection.  CT abdomen/pelvis also shows interval increase in lymphadenopathy, concerning for metastatic disease.  Medications administered prior to transfer included the following: In addition to Zosyn , the patient is also received 2 doses of IV fentanyl , morphine  4 mg IV x 1 dose.  However, in spite of the 3 doses of IV opioids, continues to report significant discomfort at the site of  his left nephrostomy tube.  Subsequently, I accepted this patient for transfer for observation to a med/tele bed at [facility] for further work-up and management of the above.      [name] Pore, DO Hospitalist

## 2024-05-25 NOTE — ED Notes (Signed)
 [facility] called for transport.

## 2024-05-25 NOTE — Progress Notes (Signed)
 Janora, [name]-called and stated that patient is anxious about nephrostomy tube. He is having increased pain at the moment. Pain medication administered. Informed her that site was assessed and dressing will be changed. Informed her that patient is on IV antibiotics and will continue. She was concerned that he may be in septic shock. I assured her that his VS does not reflect such and neither does assessment.

## 2024-05-25 NOTE — ED Notes (Signed)
 Unrine collected from neph tubes.  MD aware.

## 2024-05-25 NOTE — ED Notes (Signed)
 Pt ambulated to the bathroom with minimal assistance.  Pt appears steady on his feet and is adamant about not having help with same.

## 2024-05-25 NOTE — ED Notes (Signed)
 Pt appears anxious and wife notes he is a bit keyed up. Pt does take trazodone  to sleep at night will notify md.

## 2024-05-26 ENCOUNTER — Telehealth: Payer: Self-pay

## 2024-05-26 ENCOUNTER — Other Ambulatory Visit: Payer: Self-pay

## 2024-05-26 ENCOUNTER — Inpatient Hospital Stay: Admitting: Hematology

## 2024-05-26 ENCOUNTER — Inpatient Hospital Stay

## 2024-05-26 DIAGNOSIS — C689 Malignant neoplasm of urinary organ, unspecified: Secondary | ICD-10-CM

## 2024-05-26 DIAGNOSIS — N390 Urinary tract infection, site not specified: Secondary | ICD-10-CM | POA: Diagnosis not present

## 2024-05-26 LAB — COMPREHENSIVE METABOLIC PANEL WITH GFR
ALT: 15 U/L (ref 0–44)
AST: 18 U/L (ref 15–41)
Albumin: 2.7 g/dL — ABNORMAL LOW (ref 3.5–5.0)
Alkaline Phosphatase: 108 U/L (ref 38–126)
Anion gap: 9 (ref 5–15)
BUN: 41 mg/dL — ABNORMAL HIGH (ref 8–23)
CO2: 26 mmol/L (ref 22–32)
Calcium: 8.7 mg/dL — ABNORMAL LOW (ref 8.9–10.3)
Chloride: 97 mmol/L — ABNORMAL LOW (ref 98–111)
Creatinine, Ser: 2.15 mg/dL — ABNORMAL HIGH (ref 0.61–1.24)
GFR, Estimated: 31 mL/min — ABNORMAL LOW (ref >60)
Glucose, Bld: 165 mg/dL — ABNORMAL HIGH (ref 70–99)
Potassium: 4.2 mmol/L (ref 3.5–5.1)
Sodium: 132 mmol/L — ABNORMAL LOW (ref 135–145)
Total Bilirubin: 2.1 mg/dL — ABNORMAL HIGH (ref 0.0–1.2)
Total Protein: 7.8 g/dL (ref 6.5–8.1)

## 2024-05-26 LAB — URINE CULTURE

## 2024-05-26 LAB — CBC
HCT: 40.3 % (ref 39.0–52.0)
Hemoglobin: 12.2 g/dL — ABNORMAL LOW (ref 13.0–17.0)
MCH: 23.5 pg — ABNORMAL LOW (ref 26.0–34.0)
MCHC: 30.3 g/dL (ref 30.0–36.0)
MCV: 77.6 fL — ABNORMAL LOW (ref 80.0–100.0)
Platelets: 296 K/uL (ref 150–400)
RBC: 5.19 MIL/uL (ref 4.22–5.81)
RDW: 19.9 % — ABNORMAL HIGH (ref 11.5–15.5)
WBC: 20.8 K/uL — ABNORMAL HIGH (ref 4.0–10.5)
nRBC: 0 % (ref 0.0–0.2)

## 2024-05-26 LAB — GLUCOSE, CAPILLARY
Glucose-Capillary: 145 mg/dL — ABNORMAL HIGH (ref 70–99)
Glucose-Capillary: 196 mg/dL — ABNORMAL HIGH (ref 70–99)
Glucose-Capillary: 292 mg/dL — ABNORMAL HIGH (ref 70–99)

## 2024-05-26 LAB — PROTIME-INR
INR: 1.6 — ABNORMAL HIGH (ref 0.8–1.2)
Prothrombin Time: 19.6 s — ABNORMAL HIGH (ref 11.4–15.2)

## 2024-05-26 NOTE — Progress Notes (Addendum)
 PROGRESS NOTE    Adam Harris  FMW:[id number] DOB: Jul 15, 1946 DOA: 05/24/2024 PCP: [name], MD    Brief Narrative:  78 y.o. male with medical history significant of atrial fibrillation on apixaban , anxiety, arthritis, CAD, NSTEMI, CHF with preserved EF, COVID-19, hematuria, urothelial cancer status post nephrostomy, acquired solitary kidney, stage IV CKD, prostate cancer, fatty liver, hyperlipidemia, hypertension, herpes zoster, thoracic aortic atherosclerosis, type 2 diabetes admitted with complaints of generalized weakness fatigue decreased appetite nausea and lower abdominal pain.  UA shows moderate leukocyte esterase nitrites Many bacteria greater than 50 WBC.  White count was 15.3 on admission.  COVID RSV and influenza were negative.  Creatinine 2.05 on admission.   CT head without contrast no evidence of acute intercranial abnormality.  CT abdomen/pelvis with contrast showing bladder wall thickening diffuse perivesical fat stranding and mild perinephric stranding about the mid and distal ureter.  Differential considerations include progression of bladder cancer versus infection.  New and increased retroperitoneal, iliac chain and perirectal lymph nodes when compared to MRI from 03/31/2023 suggesting metastatic disease.  Assessment & Plan:   Principal Problem:   Complicated UTI (urinary tract infection) Active Problems:   Essential hypertension   Hyperlipidemia   Chronic diastolic CHF (congestive heart failure), NYHA class 2 (HCC)   CKD (chronic kidney disease), stage IV (HCC)   Permanent atrial fibrillation (HCC)   Nephrostomy present (HCC)   Generalized weakness   Type 2 diabetes mellitus with hyperglycemia (HCC)   #1 Complicated UTI/pyelonephritis in the setting of single kidney and left percutaneous nephrostomy tube.   Cultures pending.  Started on cefepime  2 g every 12.   CT of the abdomen concerning for progression of malignancy.   Discussed with Dr. Lanny will consult IR  to do a biopsy of the lymph node to rule out malignancy progression.      CKD (chronic kidney disease), stage IV (HCC) Creatinine 2.15 from 2.05 (hold Lasix , metolazone , ACE inhibitor, BP also running soft) Monitor renal function and electrolytes. Continue sodium bicarbonate  650 mg po BID.     Essential hypertension -on 6 meds pta. continue amlodipine  and metoprolol  tartrate 25 mg twice daily hold Lasix ,imdur , metolazone  and irbesartan  blood pressure 129/75 prior to medications. Restart as appropriate.    Chronic diastolic CHF -patient appears dry hold diuretics and ACE     Permanent atrial fibrillation (HCC) CHA?DS?-VASc Score of at least 6. Not on anticoagulation. HOLD ASPIRIN  FOR BIOPSY OF LN Continue metoprolol  for rate control.     Hyperlipidemia Continue rosuvastatin  40 mg p.o. daily.     Type 2 diabetes mellitus with hyperglycemia (HCC) CBG (last 3)  Recent Labs    05/25/24 1622 05/25/24 2055 05/26/24 0724  GLUCAP 147* 217* 145*    Carbohydrate modified diet. CBG monitoring with RI SS. Hemoglobin A1c 8.3   Estimated body mass index is 27.55 kg/m as calculated from the following:   Height as of this encounter: 5' 11 (1.803 m).   Weight as of this encounter: 89.6 kg.  DVT prophylaxis: LOVENOX Code Status:full Family Communication: none Disposition Plan:  Status is: Inpatient Remains inpatient appropriate because: acute illness   Consultants: Urology and oncology  Procedures: None Antimicrobials: Cefepime   Subjective:  Resting in bed just woke up from sleep denies any pain nausea vomiting Objective: Vitals:   05/25/24 2053 05/25/24 2200 05/25/24 2250 05/26/24 0414  BP: 120/83 (!) 143/75 (!) 143/75 129/75  Pulse: 89 71 71 77  Resp: 18   18  Temp: 98.1 F (36.7 C)  98.8 F (37.1 C)  TempSrc: Oral   Oral  SpO2: 93%   92%  Weight:      Height:        Intake/Output Summary (Last 24 hours) at 05/26/2024 1111 Last data filed at 05/26/2024  0730 Gross per 24 hour  Intake 320 ml  Output 2225 ml  Net -1905 ml   Filed Weights   05/24/24 2212 05/25/24 0602  Weight: 89.4 kg 89.6 kg    Examination:  General exam: Appears in no acute distress Respiratory system: Clear to auscultation. Respiratory effort normal. Cardiovascular system: Irregular  gastrointestinal system: Soft and nontender left percutaneous nephrostomy tube draining clear urine. Central nervous system: Alert and oriented. No focal neurological deficits. Extremities: No edema    Data Reviewed: I have personally reviewed following labs and imaging studies  CBC: Recent Labs  Lab 05/24/24 2215 05/26/24 0532  WBC 15.3* 20.8*  NEUTROABS 12.0*  --   HGB 10.8* 12.2*  HCT 34.3* 40.3  MCV 75.2* 77.6*  PLT 318 296   Basic Metabolic Panel: Recent Labs  Lab 05/19/24 1436 05/24/24 2215 05/26/24 0532  NA 133* 133* 132*  K 3.5 4.5 4.2  CL 96 99 97*  CO2 28 22 26   GLUCOSE 223* 210* 165*  BUN 48* 52* 41*  CREATININE 2.47* 2.05* 2.15*  CALCIUM  9.3 8.7* 8.7*   GFR: Estimated Creatinine Clearance: 30.6 mL/min (A) (by C-G formula based on SCr of 2.15 mg/dL (H)). Liver Function Tests: Recent Labs  Lab 05/24/24 2215 05/26/24 0532  AST 25 18  ALT 17 15  ALKPHOS 128* 108  BILITOT 0.7 2.1*  PROT 7.7 7.8  ALBUMIN  3.2* 2.7*   No results for input(s): LIPASE, AMYLASE in the last 168 hours. No results for input(s): AMMONIA in the last 168 hours. Coagulation Profile: Recent Labs  Lab 05/26/24 1014  INR 1.6*   Cardiac Enzymes: No results for input(s): CKTOTAL, CKMB, CKMBINDEX, TROPONINI in the last 168 hours. BNP (last 3 results) No results for input(s): PROBNP in the last 8760 hours. HbA1C: No results for input(s): HGBA1C in the last 72 hours. CBG: Recent Labs  Lab 05/25/24 0720 05/25/24 1113 05/25/24 1622 05/25/24 2055 05/26/24 0724  GLUCAP 166* 123* 147* 217* 145*   Lipid Profile: No results for input(s): CHOL,  HDL, LDLCALC, TRIG, CHOLHDL, LDLDIRECT in the last 72 hours. Thyroid  Function Tests: No results for input(s): TSH, T4TOTAL, FREET4, T3FREE, THYROIDAB in the last 72 hours. Anemia Panel: No results for input(s): VITAMINB12, FOLATE, FERRITIN, TIBC, IRON, RETICCTPCT in the last 72 hours. Sepsis Labs: Recent Labs  Lab 05/25/24 0020 05/25/24 0139  LATICACIDVEN 1.0 0.8    Recent Results (from the past 240 hours)  Resp panel by RT-PCR (RSV, Flu A&B, Covid) Anterior Nasal Swab     Status: None   Collection Time: 05/24/24 10:16 PM   Specimen: Anterior Nasal Swab  Result Value Ref Range Status   SARS Coronavirus 2 by RT PCR NEGATIVE NEGATIVE Final    Comment: (NOTE) SARS-CoV-2 target nucleic acids are NOT DETECTED.  The SARS-CoV-2 RNA is generally detectable in upper respiratory specimens during the acute phase of infection. The lowest concentration of SARS-CoV-2 viral copies this assay can detect is 138 copies/mL. A negative result does not preclude SARS-Cov-2 infection and should not be used as the sole basis for treatment or other patient management decisions. A negative result may occur with  improper specimen collection/handling, submission of specimen other than nasopharyngeal swab, presence of viral mutation(s) within the  areas targeted by this assay, and inadequate number of viral copies(<138 copies/mL). A negative result must be combined with clinical observations, patient history, and epidemiological information. The expected result is Negative.  Fact Sheet for Patients:  BloggerCourse.com  Fact Sheet for Healthcare Providers:  SeriousBroker.it  This test is no t yet approved or cleared by the [place]  FDA and  has been authorized for detection and/or diagnosis of SARS-CoV-2 by FDA under an Emergency Use Authorization (EUA). This EUA will remain  in effect (meaning this test can be used)  for the duration of the COVID-19 declaration under Section 564(b)(1) of the Act, 21 U.S.C.section 360bbb-3(b)(1), unless the authorization is terminated  or revoked sooner.       Influenza A by PCR NEGATIVE NEGATIVE Final   Influenza B by PCR NEGATIVE NEGATIVE Final    Comment: (NOTE) The Xpert Xpress SARS-CoV-2/FLU/RSV plus assay is intended as an aid in the diagnosis of influenza from Nasopharyngeal swab specimens and should not be used as a sole basis for treatment. Nasal washings and aspirates are unacceptable for Xpert Xpress SARS-CoV-2/FLU/RSV testing.  Fact Sheet for Patients: BloggerCourse.com  Fact Sheet for Healthcare Providers: SeriousBroker.it  This test is not yet approved or cleared by the [place]  FDA and has been authorized for detection and/or diagnosis of SARS-CoV-2 by FDA under an Emergency Use Authorization (EUA). This EUA will remain in effect (meaning this test can be used) for the duration of the COVID-19 declaration under Section 564(b)(1) of the Act, 21 U.S.C. section 360bbb-3(b)(1), unless the authorization is terminated or revoked.     Resp Syncytial Virus by PCR NEGATIVE NEGATIVE Final    Comment: (NOTE) Fact Sheet for Patients: BloggerCourse.com  Fact Sheet for Healthcare Providers: SeriousBroker.it  This test is not yet approved or cleared by the [place]  FDA and has been authorized for detection and/or diagnosis of SARS-CoV-2 by FDA under an Emergency Use Authorization (EUA). This EUA will remain in effect (meaning this test can be used) for the duration of the COVID-19 declaration under Section 564(b)(1) of the Act, 21 U.S.C. section 360bbb-3(b)(1), unless the authorization is terminated or revoked.  Performed at [facility], [address] [place], [place] [address]   Culture, blood (routine x 2)     Status: None  (Preliminary result)   Collection Time: 05/25/24 12:36 AM   Specimen: BLOOD  Result Value Ref Range Status   Specimen Description   Final    BLOOD RIGHT ANTECUBITAL Performed at [facility], [address] [place], [place] [address]    Special Requests   Final    BOTTLES DRAWN AEROBIC AND ANAEROBIC Blood Culture adequate volume Performed at [facility], [address] [place], [place] [address]    Culture   Final    NO GROWTH 1 DAY Performed at [facility], [address] [place] [address], [place], [place] [address]    Report Status PENDING  Incomplete  Urine Culture     Status: Abnormal   Collection Time: 05/25/24  2:14 AM   Specimen: Urine, Random  Result Value Ref Range Status   Specimen Description   Final    URINE, RANDOM Performed at [facility], [address] [place], [place] [address]    Special Requests   Final    NONE Reflexed from [id number] Performed at [facility], [address] [place], [place] [address]    Culture MULTIPLE SPECIES PRESENT,  SUGGEST RECOLLECTION (A)  Final   Report Status 05/26/2024 FINAL  Final         Radiology Studies: CT ABDOMEN PELVIS WO CONTRAST Result Date: 05/25/2024 CLINICAL DATA:  Acute nonlocalized abdominal pain. Left flank pain. Generalized abdominal tenderness. Fatigue, weakness, difficulty sleeping. Left nephrostomy EXAM: CT ABDOMEN AND PELVIS WITHOUT CONTRAST TECHNIQUE: Multidetector CT imaging of the abdomen and pelvis was performed following the standard protocol without IV contrast. RADIATION DOSE REDUCTION: This exam was performed according to the departmental dose-optimization program which includes automated exposure control, adjustment of the mA and/or kV according to patient size and/or use of iterative reconstruction technique. COMPARISON:  MRI 03/22/2023 and CT 02/27/2022 FINDINGS: Lower chest: No acute abnormality. Hepatobiliary: Unremarkable noncontrast appearance of the liver,  gallbladder, and biliary tree. Pancreas: Unremarkable. Spleen: Unremarkable. Adrenals/Urinary Tract: Unremarkable adrenal glands. Absent right kidney. Left nephrostomy tube. Small locule of gas in the left renal calices. No hydronephrosis. Mild perinephric stranding about the mid and distal ureter. Nondistended bladder. Diffuse perivesical fat stranding. Stomach/Bowel: Normal caliber large and small bowel. No bowel wall thickening. Stomach and appendix are within normal limits. Vascular/Lymphatic: New or increased retroperitoneal and iliac chain lymph nodes compared to MRI 03/31/2023. For example a 1.4 cm right periaortic lymph node on series 5/image 35; a 1.6 cm right iliac chain lymph node on 5/70; and a 2.2 cm left iliac chain lymph node on 5/67 (this previously measured 0.9 cm on 03/31/2023). New perirectal nodes, for example on 5/76 measuring 1.2 cm. Reproductive: Enlarged prostate. Other: Stranding and small volume free fluid in the pelvis. No free intraperitoneal air. No abscess. Musculoskeletal: No acute fracture or destructive osseous lesion. IMPRESSION: 1. Bladder wall thickening. Diffuse perivesical fat stranding and mild perinephric stranding about the mid and distal ureter. Differential considerations include progression of bladder cancer versus infection. 2. New and increased retroperitoneal, iliac chain, and perirectal lymph nodes compared to MRI 03/31/2023 suggesting metastatic disease. Electronically Signed   By: Norman Gatlin M.D.   On: 05/25/2024 00:43   CT Head Wo Contrast Result Date: 05/25/2024 CLINICAL DATA:  Mental status change, unknown cause confusion, progressive weakness, hx/o urothelial cancer EXAM: CT HEAD WITHOUT CONTRAST TECHNIQUE: Contiguous axial images were obtained from the base of the skull through the vertex without intravenous contrast. RADIATION DOSE REDUCTION: This exam was performed according to the departmental dose-optimization program which includes automated  exposure control, adjustment of the mA and/or kV according to patient size and/or use of iterative reconstruction technique. COMPARISON:  None Available. FINDINGS: Brain: No evidence of acute infarction, hemorrhage, hydrocephalus, extra-axial collection or obvious mass lesion/mass effect. Vascular: No hyperdense vessel or unexpected calcification. Skull: Normal. Negative for fracture or focal lesion. Sinuses/Orbits: Clear sinuses.  No acute orbital findings. IMPRESSION: No evidence of acute intracranial abnormality. Electronically Signed   By: Gilmore GORMAN Molt M.D.   On: 05/25/2024 00:34    Scheduled Meds:  amLODipine   5 mg Oral Daily   aspirin  EC  81 mg Oral Daily   furosemide   20 mg Oral Daily   insulin  aspart  0-5 Units Subcutaneous QHS   insulin  aspart  0-6 Units Subcutaneous TID WC   irbesartan   300 mg Oral Daily   isosorbide  mononitrate  30 mg Oral Daily   metolazone   2.5 mg Oral Daily   metoprolol  tartrate  25 mg Oral BID   mirtazapine   30 mg Oral QHS   rosuvastatin   40 mg Oral Daily   sodium bicarbonate   650 mg Oral BID   tamsulosin   0.4 mg Oral Daily   Continuous Infusions:  ceFEPime  (MAXIPIME ) IV 2 g (05/26/24 0751)     LOS: 1 day    Time spent: 50 min  Almarie KANDICE Hoots, MD 05/26/2024, 11:11 AM

## 2024-05-26 NOTE — Plan of Care (Signed)

## 2024-05-26 NOTE — TOC Initial Note (Signed)
 Transition of Care [facility]) - Initial/Assessment Note    Patient Details  Name: Adam Harris MRN: [id number] Date of Birth: 1946-03-30  [facility] CM/SW Contact:    Toy LITTIE Agar, RN Phone [phone]  05/26/2024, 3:55 PM  Clinical Narrative:                 TOC following patient with high risk for readmission. Patient states that he is from home with his spouse where he functions independently. PCP is Garnette Gale MD. Patient reports that he is active and follows up with his PCP. DME reported in home includes (cane, walker). Patient states that he does have home PT but unable to recall name of the agency. Pharmacy of choice is [facility] Patient has insurance with access to medications. Currently there are no TOC needs. TOC following.   Expected Discharge Plan: Home/Self Care Barriers to Discharge: No Barriers Identified, Continued Medical Work up   Patient Goals and CMS Choice Patient states their goals for this hospitalization and ongoing recovery are:: Wants to start feeling better and know what is wrong with him.   Choice offered to / list presented to : NA      Expected Discharge Plan and Services In-house Referral: NA Discharge Planning Services: CM Consult Post Acute Care Choice: NA Living arrangements for the past 2 months: Single Family Home                 DME Arranged: N/A DME Agency: NA       HH Arranged: NA HH Agency: NA        Prior Living Arrangements/Services Living arrangements for the past 2 months: Single Family Home Lives with:: Spouse Patient language and need for interpreter reviewed:: Yes Do you feel safe going back to the place where you live?: Yes      Need for Family Participation in Patient Care: Yes (Comment) Care giver support system in place?: Yes (comment) Current home services: Home PT, DME (cane and walker) Criminal Activity/Legal Involvement Pertinent to Current Situation/Hospitalization: No - Comment as  needed  Activities of Daily Living   ADL Screening (condition at time of admission) Independently performs ADLs?: No Does the patient have a NEW difficulty with bathing/dressing/toileting/self-feeding that is expected to last >3 days?: No Does the patient have a NEW difficulty with getting in/out of bed, walking, or climbing stairs that is expected to last >3 days?: No Does the patient have a NEW difficulty with communication that is expected to last >3 days?: No Is the patient deaf or have difficulty hearing?: No Does the patient have difficulty seeing, even when wearing glasses/contacts?: No Does the patient have difficulty concentrating, remembering, or making decisions?: No  Permission Sought/Granted Permission sought to share information with : Facility Industrial/product designer granted to share information with : Yes, Verbal Permission Granted  Share Information with NAME: Adam Harris [phone]     Permission granted to share info w Relationship: spouse  Permission granted to share info w Contact Information: [phone]  Emotional Assessment Appearance:: Appears stated age Attitude/Demeanor/Rapport: Gracious Affect (typically observed): Pleasant Orientation: : Oriented to Self, Oriented to Place, Oriented to  Time, Oriented to Situation Alcohol / Substance Use: Not Applicable Psych Involvement: No (comment)  Admission diagnosis:  Generalized weakness [R53.1] Complicated UTI (urinary tract infection) [N39.0] Patient Active Problem List   Diagnosis Date Noted   Generalized weakness 05/25/2024   Complicated UTI (urinary tract infection) 05/25/2024   Type 2 diabetes mellitus with hyperglycemia (  HCC) 05/25/2024   Nephrostomy present (HCC) 04/01/2024   AKI (acute kidney injury) (HCC) 03/08/2024   Tachyarrhythmia 03/07/2024   Hematuria 03/07/2024   Acute blood loss anemia 03/07/2024   Dysuria 01/25/2024   Presence of Watchman left atrial appendage closure device  12/13/2023   Permanent atrial fibrillation (HCC) 11/29/2023   Hx of hematuria 11/29/2023   Lower urinary tract symptoms (LUTS) 04/06/2023   CKD (chronic kidney disease), stage IV (HCC) 03/22/2023   Prostate cancer (HCC) 06/21/2022   Cataract cortical, senile, left 12/13/2021   Diabetic peripheral neuropathy (HCC) 12/13/2021   Hydrocele- Left 12/02/2021   Solitary kidney, acquired 10/26/2021   Hypertensive renal disease 10/26/2021   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   Chronic diastolic CHF (congestive heart failure), NYHA class 2 (HCC) 04/02/2017   Atrial fibrillation with RVR (HCC) 11/20/2016   OA (osteoarthritis) of knee 05/31/2016   Low back pain 05/31/2016   Exertional angina (HCC) 10/29/2014   Coronary artery disease involving native coronary artery of native heart with angina pectoris (HCC) 03/09/2014   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/2014   Hyperlipidemia 02/05/2014   History of non-ST elevation myocardial infarction (NSTEMI) 02/03/2014   Essential hypertension 02/03/2014   Anxiety state 04/19/2013   History of prostate cancer 04/19/2013   Insomnia 04/19/2013   Stiffness of joint, lower leg 04/19/2013   Sciatica 04/19/2013   PCP:  [name], MD Pharmacy:   [facility] - [place], [place] - [address] [facility] [address] [place] [address] Phone: [phone] Fax: [phone]  [id number] - [address], [place] - [address] [facility] [address] [place] [address] Phone: [phone] Fax: [phone]     Social Drivers of Health (SDOH) Social History: SDOH Screenings   Food Insecurity: No Food Insecurity (05/25/2024)  Housing: Low Risk  (05/25/2024)  Transportation Needs: No Transportation Needs (05/25/2024)  Utilities: Not At Risk (05/25/2024)  Alcohol Screen:  Low Risk  (08/06/2023)  Depression (PHQ2-9): Low Risk  (03/17/2024)  Financial Resource Strain: Low Risk  (08/06/2023)  Physical Activity: Inactive (08/06/2023)  Social Connections: Moderately Integrated (05/25/2024)  Stress: No Stress Concern Present (08/06/2023)  Tobacco Use: Medium Risk (05/24/2024)  Health Literacy: Adequate Health Literacy (08/06/2023)   SDOH Interventions:     Readmission Risk Interventions    05/26/2024    3:33 PM  Readmission Risk Prevention Plan  Transportation Screening Complete  PCP or Specialist Appt within 3-5 Days Complete  HRI or Home Care Consult Complete  Social Work Consult for Recovery Care Planning/Counseling Complete  Palliative Care Screening Not Applicable  Medication Review Oceanographer) Referral to Pharmacy

## 2024-05-26 NOTE — Progress Notes (Signed)
 Adam Harris   DOB:[birth year]   FM#:[id number]   RDW#:[id number]  Medical oncology follow up   Subjective: Patient is well-known to me, under my care for recurrent urothelial carcinoma in situ, currently on immunotherapy Keytruda , last dose on May 01, 2024.  He presented with abdominal pain and generalized weakness, low appetite and nausea.  He was found to have UTI, and has been treated with antibiotics.  He overall does feel better today, no fever or chills.   Objective:  Vitals:   05/26/24 2102 05/26/24 2104  BP: 108/75 108/75  Pulse: 86 86  Resp: 19   Temp: 98.2 F (36.8 C)   SpO2: 95%     Body mass index is 27.55 kg/m.  Intake/Output Summary (Last 24 hours) at 05/26/2024 2200 Last data filed at 05/26/2024 1710 Gross per 24 hour  Intake 680 ml  Output 1800 ml  Net -1120 ml     Sclerae unicteric  Oropharynx clear  Abdomen soft  MSK no focal spinal tenderness, no peripheral edema  Neuro nonfocal    CBG (last 3)  Recent Labs    05/26/24 0724 05/26/24 1127 05/26/24 1619  GLUCAP 145* 196* 292*     Labs:  Lab Results  Component Value Date   WBC 20.8 (H) 05/26/2024   HGB 12.2 (L) 05/26/2024   HCT 40.3 05/26/2024   MCV 77.6 (L) 05/26/2024   PLT 296 05/26/2024   NEUTROABS 12.0 (H) 05/24/2024     Urine Studies No results for input(s): UHGB, CRYS in the last 72 hours.  Invalid input(s): UACOL, UAPR, USPG, UPH, UTP, UGL, UKET, UBIL, UNIT, UROB, ULEU, UEPI, UWBC, URBC, UBAC, CAST, UCOM, BILUA  Basic Metabolic Panel: Recent Labs  Lab 05/24/24 2215 05/26/24 0532  NA 133* 132*  K 4.5 4.2  CL 99 97*  CO2 22 26  GLUCOSE 210* 165*  BUN 52* 41*  CREATININE 2.05* 2.15*  CALCIUM  8.7* 8.7*   GFR Estimated Creatinine Clearance: 30.6 mL/min (A) (by C-G formula based on SCr of 2.15 mg/dL (H)). Liver Function Tests: Recent Labs  Lab 05/24/24 2215 05/26/24 0532  AST 25 18  ALT 17 15  ALKPHOS 128* 108  BILITOT 0.7 2.1*  PROT  7.7 7.8  ALBUMIN  3.2* 2.7*   No results for input(s): LIPASE, AMYLASE in the last 168 hours. No results for input(s): AMMONIA in the last 168 hours. Coagulation profile Recent Labs  Lab 05/26/24 1014  INR 1.6*    CBC: Recent Labs  Lab 05/24/24 2215 05/26/24 0532  WBC 15.3* 20.8*  NEUTROABS 12.0*  --   HGB 10.8* 12.2*  HCT 34.3* 40.3  MCV 75.2* 77.6*  PLT 318 296   Cardiac Enzymes: No results for input(s): CKTOTAL, CKMB, CKMBINDEX, TROPONINI in the last 168 hours. BNP: Invalid input(s): POCBNP CBG: Recent Labs  Lab 05/25/24 1622 05/25/24 2055 05/26/24 0724 05/26/24 1127 05/26/24 1619  GLUCAP 147* 217* 145* 196* 292*   D-Dimer No results for input(s): DDIMER in the last 72 hours. Hgb A1c No results for input(s): HGBA1C in the last 72 hours. Lipid Profile No results for input(s): CHOL, HDL, LDLCALC, TRIG, CHOLHDL, LDLDIRECT in the last 72 hours. Thyroid  function studies No results for input(s): TSH, T4TOTAL, T3FREE, THYROIDAB in the last 72 hours.  Invalid input(s): FREET3 Anemia work up No results for input(s): VITAMINB12, FOLATE, FERRITIN, TIBC, IRON, RETICCTPCT in the last 72 hours. Microbiology Recent Results (from the past 240 hours)  Resp panel by RT-PCR (RSV, Flu A&B, Covid) Anterior Nasal Swab  Status: None   Collection Time: 05/24/24 10:16 PM   Specimen: Anterior Nasal Swab  Result Value Ref Range Status   SARS Coronavirus 2 by RT PCR NEGATIVE NEGATIVE Final    Comment: (NOTE) SARS-CoV-2 target nucleic acids are NOT DETECTED.  The SARS-CoV-2 RNA is generally detectable in upper respiratory specimens during the acute phase of infection. The lowest concentration of SARS-CoV-2 viral copies this assay can detect is 138 copies/mL. A negative result does not preclude SARS-Cov-2 infection and should not be used as the sole basis for treatment or other patient management decisions. A negative  result may occur with  improper specimen collection/handling, submission of specimen other than nasopharyngeal swab, presence of viral mutation(s) within the areas targeted by this assay, and inadequate number of viral copies(<138 copies/mL). A negative result must be combined with clinical observations, patient history, and epidemiological information. The expected result is Negative.  Fact Sheet for Patients:  BloggerCourse.com  Fact Sheet for Healthcare Providers:  SeriousBroker.it  This test is no t yet approved or cleared by the [place]  FDA and  has been authorized for detection and/or diagnosis of SARS-CoV-2 by FDA under an Emergency Use Authorization (EUA). This EUA will remain  in effect (meaning this test can be used) for the duration of the COVID-19 declaration under Section 564(b)(1) of the Act, 21 U.S.C.section 360bbb-3(b)(1), unless the authorization is terminated  or revoked sooner.       Influenza A by PCR NEGATIVE NEGATIVE Final   Influenza B by PCR NEGATIVE NEGATIVE Final    Comment: (NOTE) The Xpert Xpress SARS-CoV-2/FLU/RSV plus assay is intended as an aid in the diagnosis of influenza from Nasopharyngeal swab specimens and should not be used as a sole basis for treatment. Nasal washings and aspirates are unacceptable for Xpert Xpress SARS-CoV-2/FLU/RSV testing.  Fact Sheet for Patients: BloggerCourse.com  Fact Sheet for Healthcare Providers: SeriousBroker.it  This test is not yet approved or cleared by the [place]  FDA and has been authorized for detection and/or diagnosis of SARS-CoV-2 by FDA under an Emergency Use Authorization (EUA). This EUA will remain in effect (meaning this test can be used) for the duration of the COVID-19 declaration under Section 564(b)(1) of the Act, 21 U.S.C. section 360bbb-3(b)(1), unless the authorization is  terminated or revoked.     Resp Syncytial Virus by PCR NEGATIVE NEGATIVE Final    Comment: (NOTE) Fact Sheet for Patients: BloggerCourse.com  Fact Sheet for Healthcare Providers: SeriousBroker.it  This test is not yet approved or cleared by the [place]  FDA and has been authorized for detection and/or diagnosis of SARS-CoV-2 by FDA under an Emergency Use Authorization (EUA). This EUA will remain in effect (meaning this test can be used) for the duration of the COVID-19 declaration under Section 564(b)(1) of the Act, 21 U.S.C. section 360bbb-3(b)(1), unless the authorization is terminated or revoked.  Performed at [facility], [address] [place], [place] [address]   Culture, blood (routine x 2)     Status: None (Preliminary result)   Collection Time: 05/25/24 12:36 AM   Specimen: BLOOD  Result Value Ref Range Status   Specimen Description   Final    BLOOD RIGHT ANTECUBITAL Performed at [facility], [address] [place], [place] [address]    Special Requests   Final    BOTTLES DRAWN AEROBIC AND ANAEROBIC Blood Culture adequate volume Performed at [facility], [address], [place], [place] [address]  Culture   Final    NO GROWTH 1 DAY Performed at [facility], [address], [place], [place] [address]    Report Status PENDING  Incomplete  Urine Culture     Status: Abnormal   Collection Time: 05/25/24  2:14 AM   Specimen: Urine, Random  Result Value Ref Range Status   Specimen Description   Final    URINE, RANDOM Performed at [facility], [address] [place], [place] [address]    Special Requests   Final    NONE Reflexed from [phone] Performed at [facility], [address] [place], [place] [address]    Culture MULTIPLE SPECIES PRESENT, SUGGEST RECOLLECTION (A)  Final   Report Status 05/26/2024 FINAL  Final      Studies:  CT  ABDOMEN PELVIS WO CONTRAST Result Date: 05/25/2024 CLINICAL DATA:  Acute nonlocalized abdominal pain. Left flank pain. Generalized abdominal tenderness. Fatigue, weakness, difficulty sleeping. Left nephrostomy EXAM: CT ABDOMEN AND PELVIS WITHOUT CONTRAST TECHNIQUE: Multidetector CT imaging of the abdomen and pelvis was performed following the standard protocol without IV contrast. RADIATION DOSE REDUCTION: This exam was performed according to the departmental dose-optimization program which includes automated exposure control, adjustment of the mA and/or kV according to patient size and/or use of iterative reconstruction technique. COMPARISON:  MRI 03/22/2023 and CT 02/27/2022 FINDINGS: Lower chest: No acute abnormality. Hepatobiliary: Unremarkable noncontrast appearance of the liver, gallbladder, and biliary tree. Pancreas: Unremarkable. Spleen: Unremarkable. Adrenals/Urinary Tract: Unremarkable adrenal glands. Absent right kidney. Left nephrostomy tube. Small locule of gas in the left renal calices. No hydronephrosis. Mild perinephric stranding about the mid and distal ureter. Nondistended bladder. Diffuse perivesical fat stranding. Stomach/Bowel: Normal caliber large and small bowel. No bowel wall thickening. Stomach and appendix are within normal limits. Vascular/Lymphatic: New or increased retroperitoneal and iliac chain lymph nodes compared to MRI 03/31/2023. For example a 1.4 cm right periaortic lymph node on series 5/image 35; a 1.6 cm right iliac chain lymph node on 5/70; and a 2.2 cm left iliac chain lymph node on 5/67 (this previously measured 0.9 cm on 03/31/2023). New perirectal nodes, for example on 5/76 measuring 1.2 cm. Reproductive: Enlarged prostate. Other: Stranding and small volume free fluid in the pelvis. No free intraperitoneal air. No abscess. Musculoskeletal: No acute fracture or destructive osseous lesion. IMPRESSION: 1. Bladder wall thickening. Diffuse perivesical fat stranding and mild  perinephric stranding about the mid and distal ureter. Differential considerations include progression of bladder cancer versus infection. 2. New and increased retroperitoneal, iliac chain, and perirectal lymph nodes compared to MRI 03/31/2023 suggesting metastatic disease. Electronically Signed   By: Norman Gatlin M.D.   On: 05/25/2024 00:43   CT Head Wo Contrast Result Date: 05/25/2024 CLINICAL DATA:  Mental status change, unknown cause confusion, progressive weakness, hx/o urothelial cancer EXAM: CT HEAD WITHOUT CONTRAST TECHNIQUE: Contiguous axial images were obtained from the base of the skull through the vertex without intravenous contrast. RADIATION DOSE REDUCTION: This exam was performed according to the departmental dose-optimization program which includes automated exposure control, adjustment of the mA and/or kV according to patient size and/or use of iterative reconstruction technique. COMPARISON:  None Available. FINDINGS: Brain: No evidence of acute infarction, hemorrhage, hydrocephalus, extra-axial collection or obvious mass lesion/mass effect. Vascular: No hyperdense vessel or unexpected calcification. Skull: Normal. Negative for fracture or focal lesion. Sinuses/Orbits: Clear sinuses.  No acute orbital findings. IMPRESSION: No evidence of acute intracranial abnormality. Electronically Signed   By:  Gilmore GORMAN Molt M.D.   On: 05/25/2024 00:34    Assessment: 78 y.o. 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  Also reactive adenopathy secondary to UTI and  pyelonephritis is possible, malignancy is also on differential, especially with the significant size of lymph node.  I recommend IR percutaneous biopsy, patient agrees.  - Per patient, he has not taking baby aspirin  for the past 3 to 4 days, will inform IR, hopefully they can biopsy in the next few days. - UTI management per primary team - I have managed the above with his urologist Dr. Renda, he agrees with the plan.  He also plans to repeat a cystoscopy later this month. -I will f/u or see him back in office after discharge to review his biopsy results -I reviewed above with his wife at bedside.   Onita Mattock, MD 05/26/2024

## 2024-05-26 NOTE — Plan of Care (Signed)
  Problem: Education: Goal: Knowledge of General Education information will improve Description: Including pain rating scale, medication(s)/side effects and non-pharmacologic comfort measures Outcome: Progressing   Problem: Clinical Measurements: Goal: Respiratory complications will improve Outcome: Progressing   Problem: Activity: Goal: Risk for activity intolerance will decrease Outcome: Progressing   Problem: Nutrition: Goal: Adequate nutrition will be maintained Outcome: Progressing   Problem: Coping: Goal: Level of anxiety will decrease Outcome: Progressing   Problem: Pain Managment: Goal: General experience of comfort will improve and/or be controlled Outcome: Progressing

## 2024-05-26 NOTE — Progress Notes (Signed)
 Patient ID: Beacher Every, male   DOB: Jul 12, 1946, 78 y.o.   MRN: [id number]    Subjective: Pt admitted over the weekend with presumed pyelonephritis.  He denies pain this morning but is someone somnolent and unable to answer all questions.  Objective: Vital signs in last 24 hours: Temp:  [98.1 F (36.7 C)-99 F (37.2 C)] 98.8 F (37.1 C) (07/14 0414) Pulse Rate:  [71-89] 77 (07/14 0414) Resp:  [16-18] 18 (07/14 0414) BP: (117-143)/(75-83) 129/75 (07/14 0414) SpO2:  [92 %-94 %] 92 % (07/14 0414)  Intake/Output from previous day: 07/13 0701 - 07/14 0700 In: 230 [P.O.:120; IV Piggyback:100] Out: 1875 [Urine:1875] Intake/Output this shift: No intake/output data recorded.  Physical Exam:  General: Alert and oriented Abdomen: Soft, ND, mild left CVAT GU: L PCN draining well with grossly clear urine Ext: NT, No erythema  Lab Results: Recent Labs    05/24/24 2215 05/26/24 0532  HGB 10.8* 12.2*  HCT 34.3* 40.3      Latest Ref Rng & Units 05/26/2024    5:32 AM 05/24/2024   10:15 PM 05/01/2024   12:40 PM  CBC  WBC 4.0 - 10.5 K/uL 20.8  15.3  11.6   Hemoglobin 13.0 - 17.0 g/dL 87.7  89.1  88.4   Hematocrit 39.0 - 52.0 % 40.3  34.3  35.4   Platelets 150 - 400 K/uL 296  318  246      BMET Recent Labs    05/24/24 2215 05/26/24 0532  NA 133* 132*  K 4.5 4.2  CL 99 97*  CO2 22 26  GLUCOSE 210* 165*  BUN 52* 41*  CREATININE 2.05* 2.15*  CALCIUM  8.7* 8.7*     Studies/Results: CT ABDOMEN PELVIS WO CONTRAST Result Date: 05/25/2024 CLINICAL DATA:  Acute nonlocalized abdominal pain. Left flank pain. Generalized abdominal tenderness. Fatigue, weakness, difficulty sleeping. Left nephrostomy EXAM: CT ABDOMEN AND PELVIS WITHOUT CONTRAST TECHNIQUE: Multidetector CT imaging of the abdomen and pelvis was performed following the standard protocol without IV contrast. RADIATION DOSE REDUCTION: This exam was performed according to the departmental dose-optimization program which includes  automated exposure control, adjustment of the mA and/or kV according to patient size and/or use of iterative reconstruction technique. COMPARISON:  MRI 03/22/2023 and CT 02/27/2022 FINDINGS: Lower chest: No acute abnormality. Hepatobiliary: Unremarkable noncontrast appearance of the liver, gallbladder, and biliary tree. Pancreas: Unremarkable. Spleen: Unremarkable. Adrenals/Urinary Tract: Unremarkable adrenal glands. Absent right kidney. Left nephrostomy tube. Small locule of gas in the left renal calices. No hydronephrosis. Mild perinephric stranding about the mid and distal ureter. Nondistended bladder. Diffuse perivesical fat stranding. Stomach/Bowel: Normal caliber large and small bowel. No bowel wall thickening. Stomach and appendix are within normal limits. Vascular/Lymphatic: New or increased retroperitoneal and iliac chain lymph nodes compared to MRI 03/31/2023. For example a 1.4 cm right periaortic lymph node on series 5/image 35; a 1.6 cm right iliac chain lymph node on 5/70; and a 2.2 cm left iliac chain lymph node on 5/67 (this previously measured 0.9 cm on 03/31/2023). New perirectal nodes, for example on 5/76 measuring 1.2 cm. Reproductive: Enlarged prostate. Other: Stranding and small volume free fluid in the pelvis. No free intraperitoneal air. No abscess. Musculoskeletal: No acute fracture or destructive osseous lesion. IMPRESSION: 1. Bladder wall thickening. Diffuse perivesical fat stranding and mild perinephric stranding about the mid and distal ureter. Differential considerations include progression of bladder cancer versus infection. 2. New and increased retroperitoneal, iliac chain, and perirectal lymph nodes compared to MRI 03/31/2023 suggesting metastatic disease.  Electronically Signed   By: Norman Gatlin M.D.   On: 05/25/2024 00:43   CT Head Wo Contrast Result Date: 05/25/2024 CLINICAL DATA:  Mental status change, unknown cause confusion, progressive weakness, hx/o urothelial cancer  EXAM: CT HEAD WITHOUT CONTRAST TECHNIQUE: Contiguous axial images were obtained from the base of the skull through the vertex without intravenous contrast. RADIATION DOSE REDUCTION: This exam was performed according to the departmental dose-optimization program which includes automated exposure control, adjustment of the mA and/or kV according to patient size and/or use of iterative reconstruction technique. COMPARISON:  None Available. FINDINGS: Brain: No evidence of acute infarction, hemorrhage, hydrocephalus, extra-axial collection or obvious mass lesion/mass effect. Vascular: No hyperdense vessel or unexpected calcification. Skull: Normal. Negative for fracture or focal lesion. Sinuses/Orbits: Clear sinuses.  No acute orbital findings. IMPRESSION: No evidence of acute intracranial abnormality. Electronically Signed   By: Gilmore GORMAN Molt M.D.   On: 05/25/2024 00:34    Assessment/Plan: 1) Pyelonephritis: Presumed pyelonephritis with urine cultures pending.  WBC worse this morning but not febrile.  Would continue broad spectrum coverage pending culture results.  Follow CBC. 2) CKD:  He has an indwelling left PCN from last hospitalization but did not have clear evidence of obstruction.  However, he developed AKI everytime we tried to clamp his PCN.  Will need to restudy him with a nephrostogram but would defer this until infection is controlled. 3) Urothelial carcinoma:  CT indicates lymphadenopathy that may reflect acute infection vs possible progressive malignancy.  I will make Dr. Lanny aware to get her opinion.  Likely will need repeat close surveillance imaging to determine concern for malignancy.  Last dose of pembrolizumab  was 6/19.   LOS: 1 day   Noretta Ferrara 05/26/2024, 7:22 AM

## 2024-05-26 NOTE — Telephone Encounter (Signed)
 Pt's wife called stating the pt is currently admitted in the hospital and is requesting if Dr. Lanny would give her a call regarding her husband today or possibly come up to see pt in the hospital.  Stated that Dr. Lanny is currently seeing pts in clinic.  Stated this nurse will make Dr. Lanny aware of their call.  Pt's wife verbalized understanding.

## 2024-05-27 ENCOUNTER — Inpatient Hospital Stay (HOSPITAL_COMMUNITY)

## 2024-05-27 DIAGNOSIS — N390 Urinary tract infection, site not specified: Secondary | ICD-10-CM | POA: Diagnosis not present

## 2024-05-27 LAB — CBC
HCT: 38.3 % — ABNORMAL LOW (ref 39.0–52.0)
Hemoglobin: 11.6 g/dL — ABNORMAL LOW (ref 13.0–17.0)
MCH: 23.2 pg — ABNORMAL LOW (ref 26.0–34.0)
MCHC: 30.3 g/dL (ref 30.0–36.0)
MCV: 76.6 fL — ABNORMAL LOW (ref 80.0–100.0)
Platelets: 263 K/uL (ref 150–400)
RBC: 5 MIL/uL (ref 4.22–5.81)
RDW: 19.9 % — ABNORMAL HIGH (ref 11.5–15.5)
WBC: 19.1 K/uL — ABNORMAL HIGH (ref 4.0–10.5)
nRBC: 0 % (ref 0.0–0.2)

## 2024-05-27 LAB — COMPREHENSIVE METABOLIC PANEL WITH GFR
ALT: 13 U/L (ref 0–44)
AST: 15 U/L (ref 15–41)
Albumin: 2.5 g/dL — ABNORMAL LOW (ref 3.5–5.0)
Alkaline Phosphatase: 117 U/L (ref 38–126)
Anion gap: 7 (ref 5–15)
BUN: 44 mg/dL — ABNORMAL HIGH (ref 8–23)
CO2: 28 mmol/L (ref 22–32)
Calcium: 8.6 mg/dL — ABNORMAL LOW (ref 8.9–10.3)
Chloride: 97 mmol/L — ABNORMAL LOW (ref 98–111)
Creatinine, Ser: 2.47 mg/dL — ABNORMAL HIGH (ref 0.61–1.24)
GFR, Estimated: 26 mL/min — ABNORMAL LOW (ref >60)
Glucose, Bld: 178 mg/dL — ABNORMAL HIGH (ref 70–99)
Potassium: 3.9 mmol/L (ref 3.5–5.1)
Sodium: 132 mmol/L — ABNORMAL LOW (ref 135–145)
Total Bilirubin: 1.7 mg/dL — ABNORMAL HIGH (ref 0.0–1.2)
Total Protein: 7.5 g/dL (ref 6.5–8.1)

## 2024-05-27 LAB — GLUCOSE, CAPILLARY
Glucose-Capillary: 192 mg/dL — ABNORMAL HIGH (ref 70–99)
Glucose-Capillary: 296 mg/dL — ABNORMAL HIGH (ref 70–99)
Glucose-Capillary: 207 mg/dL — ABNORMAL HIGH (ref 70–99)
Glucose-Capillary: 237 mg/dL — ABNORMAL HIGH (ref 70–99)

## 2024-05-27 MED ORDER — SODIUM CHLORIDE 0.9 % IV SOLN
2.0000 g | INTRAVENOUS | Status: AC
  Administered 2024-05-28 – 2024-05-31 (×4): 2 g via INTRAVENOUS
  Filled 2024-05-27 (×4): qty 12.5

## 2024-05-27 MED ORDER — SODIUM CHLORIDE 0.9 % IV SOLN: INTRAVENOUS | Status: AC

## 2024-05-27 NOTE — Plan of Care (Signed)
  Problem: Education: Goal: Knowledge of General Education information will improve Description: Including pain rating scale, medication(s)/side effects and non-pharmacologic comfort measures Outcome: Progressing   Problem: Nutrition: Goal: Adequate nutrition will be maintained Outcome: Progressing   Problem: Pain Managment: Goal: General experience of comfort will improve and/or be controlled Outcome: Progressing   Problem: Safety: Goal: Ability to remain free from injury will improve Outcome: Progressing   Problem: Skin Integrity: Goal: Risk for impaired skin integrity will decrease Outcome: Progressing

## 2024-05-27 NOTE — Progress Notes (Addendum)
 PROGRESS NOTE    Adam Harris  FMW:[id number] DOB: 04/22/1946 DOA: 05/24/2024 PCP: [name], MD    Brief Narrative:  78 y.o. male with medical history significant of atrial fibrillation on apixaban , anxiety, arthritis, CAD, NSTEMI, CHF with preserved EF, COVID-19, hematuria, urothelial cancer status post nephrostomy, acquired solitary kidney, stage IV CKD, prostate cancer, fatty liver, hyperlipidemia, hypertension, herpes zoster, thoracic aortic atherosclerosis, type 2 diabetes admitted with complaints of generalized weakness fatigue decreased appetite nausea and lower abdominal pain.  UA shows moderate leukocyte esterase nitrites Many bacteria greater than 50 WBC.  White count was 15.3 on admission.  COVID RSV and influenza were negative.  Creatinine 2.05 on admission.   CT head without contrast no evidence of acute intercranial abnormality.  CT abdomen/pelvis with contrast showing bladder wall thickening diffuse perivesical fat stranding and mild perinephric stranding about the mid and distal ureter.  Differential considerations include progression of bladder cancer versus infection.  New and increased retroperitoneal, iliac chain and perirectal lymph nodes when compared to MRI from 03/31/2023 suggesting metastatic disease.  Assessment & Plan:   Principal Problem:   Complicated UTI (urinary tract infection) Active Problems:   Essential hypertension   Hyperlipidemia   Chronic diastolic CHF (congestive heart failure), NYHA class 2 (HCC)   CKD (chronic kidney disease), stage IV (HCC)   Permanent atrial fibrillation (HCC)   Nephrostomy present (HCC)   Generalized weakness   Type 2 diabetes mellitus with hyperglycemia (HCC)   Complicated UTI/pyelonephritis in the setting of single kidney and left percutaneous nephrostomy tube.    Started on cefepime  2 g every 12.   Still with leukocytosis culture shows multiple species CT of the abdomen concerning for progression of malignancy.    Discussed with Dr. Lanny will consulted IR to do a biopsy of the lymph node to rule out malignancy progression.  Since patient got aspirin  on the 14th IR would like to hold off on doing node biopsy for the next 3 days.    CKD (chronic kidney disease), stage IV (HCC) Creatinine 2.47 from 2.15 from 2.05 (hold Lasix , metolazone , ACE inhibitor, BP also running soft) Monitor renal function and electrolytes. Continue sodium bicarbonate  650 mg po BID. Not on any nephrotoxins currently however blood pressure is running soft 97/58 will give him a bag of IV fluids and recheck labs in AM.  DC amlodipine  and metoprolol .     Essential hypertension -on 6 meds pta.  Hold amlodipine  and metoprolol  since BP running soft. hold Lasix ,imdur , metolazone  and irbesartan  blood pressure 97/58  Chronic diastolic CHF -patient appears dry hold diuretics and ACE     Permanent atrial fibrillation (HCC) CHA?DS?-VASc Score of at least 6. Not on anticoagulation. HOLD ASPIRIN  FOR BIOPSY OF LN Holding metoprolol  for soft blood pressure restart as appropriate     Hyperlipidemia Continue rosuvastatin  40 mg p.o. daily.     Type 2 diabetes mellitus with hyperglycemia (HCC) CBG (last 3)  Recent Labs    05/26/24 1619 05/27/24 0718 05/27/24 1227  GLUCAP 292* 192* 296*    Carbohydrate modified diet. CBG monitoring with RI SS. Hemoglobin A1c 8.3   Estimated body mass index is 27.55 kg/m as calculated from the following:   Height as of this encounter: 5' 11 (1.803 m).   Weight as of this encounter: 89.6 kg.  DVT prophylaxis: LOVENOX Code Status:full Family Communication: wife Disposition Plan:  Status is: Inpatient Remains inpatient appropriate because: acute illness   Consultants: Urology and oncology  Procedures: None Antimicrobials: Cefepime   Subjective:  Denies any pain No overnight events Objective: Vitals:   05/26/24 2102 05/26/24 2104 05/27/24 0434 05/27/24 1352  BP: 108/75 108/75 127/73 (!)  97/58  Pulse: 86 86 79 84  Resp: 19  18 17   Temp: 98.2 F (36.8 C)  98.1 F (36.7 C) 98.1 F (36.7 C)  TempSrc: Oral  Oral Oral  SpO2: 95%  92% 91%  Weight:      Height:        Intake/Output Summary (Last 24 hours) at 05/27/2024 1352 Last data filed at 05/27/2024 1150 Gross per 24 hour  Intake 580 ml  Output 1775 ml  Net -1195 ml   Filed Weights   05/24/24 2212 05/25/24 0602  Weight: 89.4 kg 89.6 kg    Examination:  General exam: Appears in no acute distress Respiratory system: Clear to auscultation. Respiratory effort normal. Cardiovascular system: Irregular  gastrointestinal system: Soft and nontender left percutaneous nephrostomy tube draining clear urine. Central nervous system: Alert and oriented. No focal neurological deficits. Extremities: No edema    Data Reviewed: I have personally reviewed following labs and imaging studies  CBC: Recent Labs  Lab 05/24/24 2215 05/26/24 0532 05/27/24 0553  WBC 15.3* 20.8* 19.1*  NEUTROABS 12.0*  --   --   HGB 10.8* 12.2* 11.6*  HCT 34.3* 40.3 38.3*  MCV 75.2* 77.6* 76.6*  PLT 318 296 263   Basic Metabolic Panel: Recent Labs  Lab 05/24/24 2215 05/26/24 0532 05/27/24 0553  NA 133* 132* 132*  K 4.5 4.2 3.9  CL 99 97* 97*  CO2 22 26 28   GLUCOSE 210* 165* 178*  BUN 52* 41* 44*  CREATININE 2.05* 2.15* 2.47*  CALCIUM  8.7* 8.7* 8.6*   GFR: Estimated Creatinine Clearance: 26.7 mL/min (A) (by C-G formula based on SCr of 2.47 mg/dL (H)). Liver Function Tests: Recent Labs  Lab 05/24/24 2215 05/26/24 0532 05/27/24 0553  AST 25 18 15   ALT 17 15 13   ALKPHOS 128* 108 117  BILITOT 0.7 2.1* 1.7*  PROT 7.7 7.8 7.5  ALBUMIN  3.2* 2.7* 2.5*   No results for input(s): LIPASE, AMYLASE in the last 168 hours. No results for input(s): AMMONIA in the last 168 hours. Coagulation Profile: Recent Labs  Lab 05/26/24 1014  INR 1.6*   Cardiac Enzymes: No results for input(s): CKTOTAL, CKMB, CKMBINDEX,  TROPONINI in the last 168 hours. BNP (last 3 results) No results for input(s): PROBNP in the last 8760 hours. HbA1C: No results for input(s): HGBA1C in the last 72 hours. CBG: Recent Labs  Lab 05/26/24 0724 05/26/24 1127 05/26/24 1619 05/27/24 0718 05/27/24 1227  GLUCAP 145* 196* 292* 192* 296*   Lipid Profile: No results for input(s): CHOL, HDL, LDLCALC, TRIG, CHOLHDL, LDLDIRECT in the last 72 hours. Thyroid  Function Tests: No results for input(s): TSH, T4TOTAL, FREET4, T3FREE, THYROIDAB in the last 72 hours. Anemia Panel: No results for input(s): VITAMINB12, FOLATE, FERRITIN, TIBC, IRON, RETICCTPCT in the last 72 hours. Sepsis Labs: Recent Labs  Lab 05/25/24 0020 05/25/24 0139  LATICACIDVEN 1.0 0.8    Recent Results (from the past 240 hours)  Resp panel by RT-PCR (RSV, Flu A&B, Covid) Anterior Nasal Swab     Status: None   Collection Time: 05/24/24 10:16 PM   Specimen: Anterior Nasal Swab  Result Value Ref Range Status   SARS Coronavirus 2 by RT PCR NEGATIVE NEGATIVE Final    Comment: (NOTE) SARS-CoV-2 target nucleic acids are NOT DETECTED.  The SARS-CoV-2 RNA is generally detectable in upper respiratory  specimens during the acute phase of infection. The lowest concentration of SARS-CoV-2 viral copies this assay can detect is 138 copies/mL. A negative result does not preclude SARS-Cov-2 infection and should not be used as the sole basis for treatment or other patient management decisions. A negative result may occur with  improper specimen collection/handling, submission of specimen other than nasopharyngeal swab, presence of viral mutation(s) within the areas targeted by this assay, and inadequate number of viral copies(<138 copies/mL). A negative result must be combined with clinical observations, patient history, and epidemiological information. The expected result is Negative.  Fact Sheet for Patients:   BloggerCourse.com  Fact Sheet for Healthcare Providers:  SeriousBroker.it  This test is no t yet approved or cleared by the [place]  FDA and  has been authorized for detection and/or diagnosis of SARS-CoV-2 by FDA under an Emergency Use Authorization (EUA). This EUA will remain  in effect (meaning this test can be used) for the duration of the COVID-19 declaration under Section 564(b)(1) of the Act, 21 U.S.C.section 360bbb-3(b)(1), unless the authorization is terminated  or revoked sooner.       Influenza A by PCR NEGATIVE NEGATIVE Final   Influenza B by PCR NEGATIVE NEGATIVE Final    Comment: (NOTE) The Xpert Xpress SARS-CoV-2/FLU/RSV plus assay is intended as an aid in the diagnosis of influenza from Nasopharyngeal swab specimens and should not be used as a sole basis for treatment. Nasal washings and aspirates are unacceptable for Xpert Xpress SARS-CoV-2/FLU/RSV testing.  Fact Sheet for Patients: BloggerCourse.com  Fact Sheet for Healthcare Providers: SeriousBroker.it  This test is not yet approved or cleared by the [place]  FDA and has been authorized for detection and/or diagnosis of SARS-CoV-2 by FDA under an Emergency Use Authorization (EUA). This EUA will remain in effect (meaning this test can be used) for the duration of the COVID-19 declaration under Section 564(b)(1) of the Act, 21 U.S.C. section 360bbb-3(b)(1), unless the authorization is terminated or revoked.     Resp Syncytial Virus by PCR NEGATIVE NEGATIVE Final    Comment: (NOTE) Fact Sheet for Patients: BloggerCourse.com  Fact Sheet for Healthcare Providers: SeriousBroker.it  This test is not yet approved or cleared by the [place]  FDA and has been authorized for detection and/or diagnosis of SARS-CoV-2 by FDA under an Emergency Use  Authorization (EUA). This EUA will remain in effect (meaning this test can be used) for the duration of the COVID-19 declaration under Section 564(b)(1) of the Act, 21 U.S.C. section 360bbb-3(b)(1), unless the authorization is terminated or revoked.  Performed at [facility], [address] [place], [place] [address]   Culture, blood (routine x 2)     Status: None (Preliminary result)   Collection Time: 05/25/24 12:36 AM   Specimen: BLOOD  Result Value Ref Range Status   Specimen Description   Final    BLOOD RIGHT ANTECUBITAL Performed at [facility], [address] [place], [place] [address]    Special Requests   Final    BOTTLES DRAWN AEROBIC AND ANAEROBIC Blood Culture adequate volume Performed at [facility], [address] [place], [place] [address]    Culture   Final    NO GROWTH 2 DAYS Performed at [facility], [address], [place], [place] [address]    Report Status PENDING  Incomplete  Urine Culture     Status: Abnormal   Collection Time: 05/25/24  2:14 AM   Specimen: Urine, Random  Result Value Ref Range Status   Specimen Description   Final    URINE, RANDOM Performed at [facility], [address] [place], [place] [address]    Special Requests   Final    NONE Reflexed from [id number] Performed at [facility], [address] [place], [place] [address]    Culture MULTIPLE SPECIES PRESENT, SUGGEST RECOLLECTION (A)  Final   Report Status 05/26/2024 FINAL  Final         Radiology Studies: No results found.   Scheduled Meds:  amLODipine   5 mg Oral Daily   insulin  aspart  0-5 Units Subcutaneous QHS   insulin  aspart  0-6 Units Subcutaneous TID WC   metoprolol  tartrate  25 mg Oral BID   mirtazapine   30 mg Oral QHS   rosuvastatin   40 mg Oral Daily   sodium bicarbonate   650 mg Oral BID   tamsulosin   0.4 mg Oral Daily   Continuous Infusions:  [START ON 05/28/2024] ceFEPime  (MAXIPIME ) IV        LOS: 2 days    Time spent: 50 min  Almarie KANDICE Hoots, MD 05/27/2024, 1:52 PM

## 2024-05-27 NOTE — Plan of Care (Signed)
  Problem: Education: Goal: Knowledge of General Education information will improve Description: Including pain rating scale, medication(s)/side effects and non-pharmacologic comfort measures Outcome: Progressing   Problem: Clinical Measurements: Goal: Respiratory complications will improve Outcome: Progressing Goal: Cardiovascular complication will be avoided Outcome: Progressing   Problem: Activity: Goal: Risk for activity intolerance will decrease Outcome: Progressing   Problem: Nutrition: Goal: Adequate nutrition will be maintained Outcome: Progressing   Problem: Coping: Goal: Level of anxiety will decrease Outcome: Progressing   Problem: Elimination: Goal: Will not experience complications related to urinary retention Outcome: Progressing   Problem: Pain Managment: Goal: General experience of comfort will improve and/or be controlled Outcome: Progressing

## 2024-05-28 DIAGNOSIS — N390 Urinary tract infection, site not specified: Secondary | ICD-10-CM | POA: Diagnosis not present

## 2024-05-28 DIAGNOSIS — I10 Essential (primary) hypertension: Secondary | ICD-10-CM

## 2024-05-28 DIAGNOSIS — I5032 Chronic diastolic (congestive) heart failure: Secondary | ICD-10-CM

## 2024-05-28 DIAGNOSIS — R531 Weakness: Secondary | ICD-10-CM

## 2024-05-28 LAB — CBC
HCT: 38.4 % — ABNORMAL LOW (ref 39.0–52.0)
Hemoglobin: 11.4 g/dL — ABNORMAL LOW (ref 13.0–17.0)
MCH: 23.2 pg — ABNORMAL LOW (ref 26.0–34.0)
MCHC: 29.7 g/dL — ABNORMAL LOW (ref 30.0–36.0)
MCV: 78 fL — ABNORMAL LOW (ref 80.0–100.0)
Platelets: 249 K/uL (ref 150–400)
RBC: 4.92 MIL/uL (ref 4.22–5.81)
RDW: 19.9 % — ABNORMAL HIGH (ref 11.5–15.5)
WBC: 18.8 K/uL — ABNORMAL HIGH (ref 4.0–10.5)
nRBC: 0 % (ref 0.0–0.2)

## 2024-05-28 LAB — GLUCOSE, CAPILLARY
Glucose-Capillary: 166 mg/dL — ABNORMAL HIGH (ref 70–99)
Glucose-Capillary: 167 mg/dL — ABNORMAL HIGH (ref 70–99)
Glucose-Capillary: 195 mg/dL — ABNORMAL HIGH (ref 70–99)

## 2024-05-28 LAB — COMPREHENSIVE METABOLIC PANEL WITH GFR
ALT: 15 U/L (ref 0–44)
AST: 22 U/L (ref 15–41)
Albumin: 2.3 g/dL — ABNORMAL LOW (ref 3.5–5.0)
Alkaline Phosphatase: 123 U/L (ref 38–126)
Anion gap: 12 (ref 5–15)
BUN: 38 mg/dL — ABNORMAL HIGH (ref 8–23)
CO2: 22 mmol/L (ref 22–32)
Calcium: 8.4 mg/dL — ABNORMAL LOW (ref 8.9–10.3)
Chloride: 96 mmol/L — ABNORMAL LOW (ref 98–111)
Creatinine, Ser: 2.32 mg/dL — ABNORMAL HIGH (ref 0.61–1.24)
GFR, Estimated: 28 mL/min — ABNORMAL LOW (ref >60)
Glucose, Bld: 167 mg/dL — ABNORMAL HIGH (ref 70–99)
Potassium: 3.4 mmol/L — ABNORMAL LOW (ref 3.5–5.1)
Sodium: 130 mmol/L — ABNORMAL LOW (ref 135–145)
Total Bilirubin: 1.5 mg/dL — ABNORMAL HIGH (ref 0.0–1.2)
Total Protein: 7 g/dL (ref 6.5–8.1)

## 2024-05-28 MED ORDER — POLYETHYLENE GLYCOL 3350 17 G PO PACK
17.0000 g | PACK | Freq: Every day | ORAL | Status: DC | PRN
  Administered 2024-05-29 – 2024-05-31 (×2): 17 g via ORAL
  Filled 2024-05-28 (×2): qty 1

## 2024-05-28 MED ORDER — DOCUSATE SODIUM 50 MG PO CAPS
50.0000 mg | ORAL_CAPSULE | Freq: Once | ORAL | Status: AC
  Administered 2024-05-28: 50 mg via ORAL
  Filled 2024-05-28: qty 1

## 2024-05-28 NOTE — Plan of Care (Signed)
  Problem: Education: Goal: Knowledge of General Education information will improve Description: Including pain rating scale, medication(s)/side effects and non-pharmacologic comfort measures Outcome: Progressing   Problem: Nutrition: Goal: Adequate nutrition will be maintained Outcome: Progressing   Problem: Pain Managment: Goal: General experience of comfort will improve and/or be controlled Outcome: Progressing   Problem: Safety: Goal: Ability to remain free from injury will improve Outcome: Progressing   Problem: Skin Integrity: Goal: Risk for impaired skin integrity will decrease Outcome: Progressing

## 2024-05-28 NOTE — Progress Notes (Signed)
 Mobility Specialist Cancellation/Refusal Note:  Pt  resting/sleeping at this time. Will check back as schedule permits.   [facility]

## 2024-05-28 NOTE — Progress Notes (Signed)
 Family requesting to be contacted in regards to all updates.  Wife Bayron Dalto, [phone]

## 2024-05-28 NOTE — Progress Notes (Signed)
 [facility]  PROGRESS NOTE  Adam Harris FMW:[id number] DOB: 1946-04-27 DOA: 05/24/2024 PCP: [name], MD   Brief HPI:   78 y.o. male with medical history significant of atrial fibrillation on apixaban , anxiety, arthritis, CAD, NSTEMI, CHF with preserved EF, COVID-19, hematuria, urothelial cancer status post nephrostomy, acquired solitary kidney, stage IV CKD, prostate cancer, fatty liver, hyperlipidemia, hypertension, herpes zoster, thoracic aortic atherosclerosis, type 2 diabetes admitted with complaints of generalized weakness fatigue decreased appetite nausea and lower abdominal pain.  UA shows moderate leukocyte esterase nitrites Many bacteria greater than 50 WBC.  White count was 15.3 on admission.  COVID RSV and influenza were negative.  Creatinine 2.05 on admission.   CT head without contrast no evidence of acute intercranial abnormality.  CT abdomen/pelvis with contrast showing bladder wall thickening diffuse perivesical fat stranding and mild perinephric stranding about the mid and distal ureter.  Differential considerations include progression of bladder cancer versus infection.  New and increased retroperitoneal, iliac chain and perirectal lymph nodes when compared to MRI from 03/31/2023 suggesting metastatic disease.    Assessment/Plan:    Complicated UTI/pyelonephritis in the setting of single kidney and left percutaneous nephrostomy tube.    Started on cefepime  2 g every 12.   Still with leukocytosis, culture shows multiple species CT of the abdomen concerning for progression of malignancy.   Discussed with Dr. Lanny will consulted IR to do a biopsy of the lymph node to rule out malignancy progression.  Since patient got aspirin  on the 14th IR would like to hold off on doing node biopsy for the next 3 days.     CKD (chronic kidney disease), stage IV (HCC) Creatinine 2.47 from 2.15 from 2.05 (hold Lasix , metolazone , ACE inhibitor, BP also running soft) Monitor renal  function and electrolytes. Continue sodium bicarbonate  650 mg po BID.   DC amlodipine  and metoprolol  due to soft BP.     Essential hypertension -on 6 meds pta.  Hold amlodipine  and metoprolol  since BP running soft. hold Lasix ,imdur , metolazone  and irbesartan   - Blood pressure is stable today   Chronic diastolic CHF -patient appears dry hold diuretics and ACE   Permanent atrial fibrillation (HCC) CHA?DS?-VASc Score of at least 6. Not on anticoagulation. HOLD ASPIRIN  FOR BIOPSY OF LN Holding metoprolol  for soft blood pressure restart as appropriate   Hyperlipidemia Continue rosuvastatin  40 mg p.o. daily.     Type 2 diabetes mellitus with hyperglycemia (HCC) CBG (last 3)   Carbohydrate modified diet. CBG monitoring with RI SS. Hemoglobin A1c 8.3    Hyponatremia - Likely in setting of diuretic use - Diuretics on hold - Follow serum sodium level in a.m.  Medications     insulin  aspart  0-5 Units Subcutaneous QHS   insulin  aspart  0-6 Units Subcutaneous TID WC   mirtazapine   30 mg Oral QHS   rosuvastatin   40 mg Oral Daily   sodium bicarbonate   650 mg Oral BID   tamsulosin   0.4 mg Oral Daily     Data Reviewed:   CBG:  Recent Labs  Lab 05/27/24 0718 05/27/24 1227 05/27/24 1624 05/27/24 2107 05/28/24 0748  GLUCAP 192* 296* 207* 237* 166*    SpO2: 96 %    Vitals:   05/27/24 0434 05/27/24 1352 05/27/24 2039 05/28/24 0430  BP: 127/73 (!) 97/58 123/64 118/71  Pulse: 79 84 (!) 104 80  Resp: 18 17 18 18   Temp: 98.1 F (36.7 C) 98.1 F (36.7 C) 98 F (36.7 C) 98.2 F (36.8 C)  TempSrc: Oral Oral Oral Oral  SpO2: 92% 91% 97% 96%  Weight:      Height:          Data Reviewed:  Basic Metabolic Panel: Recent Labs  Lab 05/24/24 2215 05/26/24 0532 05/27/24 0553 05/28/24 0554  NA 133* 132* 132* 130*  K 4.5 4.2 3.9 3.4*  CL 99 97* 97* 96*  CO2 22 26 28 22   GLUCOSE 210* 165* 178* 167*  BUN 52* 41* 44* 38*  CREATININE 2.05* 2.15* 2.47* 2.32*  CALCIUM   8.7* 8.7* 8.6* 8.4*    CBC: Recent Labs  Lab 05/24/24 2215 05/26/24 0532 05/27/24 0553 05/28/24 0554  WBC 15.3* 20.8* 19.1* 18.8*  NEUTROABS 12.0*  --   --   --   HGB 10.8* 12.2* 11.6* 11.4*  HCT 34.3* 40.3 38.3* 38.4*  MCV 75.2* 77.6* 76.6* 78.0*  PLT 318 296 263 249    LFT Recent Labs  Lab 05/24/24 2215 05/26/24 0532 05/27/24 0553 05/28/24 0554  AST 25 18 15 22   ALT 17 15 13 15   ALKPHOS 128* 108 117 123  BILITOT 0.7 2.1* 1.7* 1.5*  PROT 7.7 7.8 7.5 7.0  ALBUMIN  3.2* 2.7* 2.5* 2.3*     Antibiotics: Anti-infectives (From admission, onward)    Start     Dose/Rate Route Frequency Ordered Stop   05/28/24 0800  ceFEPIme  (MAXIPIME ) 2 g in sodium chloride  0.9 % 100 mL IVPB        2 g 200 mL/hr over 30 Minutes Intravenous Every 24 hours 05/27/24 0817     05/25/24 0800  ceFEPIme  (MAXIPIME ) 2 g in sodium chloride  0.9 % 100 mL IVPB  Status:  Discontinued        2 g 200 mL/hr over 30 Minutes Intravenous Every 12 hours 05/25/24 0605 05/27/24 0817   05/25/24 0215  piperacillin -tazobactam (ZOSYN ) IVPB 3.375 g        3.375 g 100 mL/hr over 30 Minutes Intravenous  Once 05/25/24 0210 05/25/24 0248        DVT prophylaxis: SCDs  Code Status: Full code  Family Communication: No family at bedside   CONSULTS    Subjective   Denies abdominal pain   Objective    Physical Examination:   General-appears in no acute distress Heart-S1-S2, regular, no murmur auscultated Lungs-clear to auscultation bilaterally, no wheezing or crackles auscultated Abdomen-soft, nontender, no organomegaly Extremities-no edema in the lower extremities Neuro-alert, oriented x3, no focal deficit noted   Status is: Inpatient:             Adam Harris   [facility] If 7PM-7AM, please contact night-coverage at [email or web address], Office  [phone]   05/28/2024, 9:04 AM  LOS: 3 days

## 2024-05-28 NOTE — Progress Notes (Signed)
 Please contact [name] (wife)  regarding the following:  Biopsy dates and results if available. Tube in back. Expected discharge date  Adam Harris [phone]

## 2024-05-29 DIAGNOSIS — I10 Essential (primary) hypertension: Secondary | ICD-10-CM | POA: Diagnosis not present

## 2024-05-29 DIAGNOSIS — N390 Urinary tract infection, site not specified: Secondary | ICD-10-CM | POA: Diagnosis not present

## 2024-05-29 DIAGNOSIS — I5032 Chronic diastolic (congestive) heart failure: Secondary | ICD-10-CM | POA: Diagnosis not present

## 2024-05-29 DIAGNOSIS — R531 Weakness: Secondary | ICD-10-CM | POA: Diagnosis not present

## 2024-05-29 LAB — CBC
HCT: 36.5 % — ABNORMAL LOW (ref 39.0–52.0)
Hemoglobin: 11.4 g/dL — ABNORMAL LOW (ref 13.0–17.0)
MCH: 23.7 pg — ABNORMAL LOW (ref 26.0–34.0)
MCHC: 31.2 g/dL (ref 30.0–36.0)
MCV: 75.9 fL — ABNORMAL LOW (ref 80.0–100.0)
Platelets: 243 K/uL (ref 150–400)
RBC: 4.81 MIL/uL (ref 4.22–5.81)
RDW: 19.4 % — ABNORMAL HIGH (ref 11.5–15.5)
WBC: 17 K/uL — ABNORMAL HIGH (ref 4.0–10.5)
nRBC: 0 % (ref 0.0–0.2)

## 2024-05-29 LAB — COMPREHENSIVE METABOLIC PANEL WITH GFR
ALT: 12 U/L (ref 0–44)
AST: 17 U/L (ref 15–41)
Albumin: 2.1 g/dL — ABNORMAL LOW (ref 3.5–5.0)
Alkaline Phosphatase: 125 U/L (ref 38–126)
Anion gap: 10 (ref 5–15)
BUN: 34 mg/dL — ABNORMAL HIGH (ref 8–23)
CO2: 25 mmol/L (ref 22–32)
Calcium: 8.3 mg/dL — ABNORMAL LOW (ref 8.9–10.3)
Chloride: 95 mmol/L — ABNORMAL LOW (ref 98–111)
Creatinine, Ser: 2.27 mg/dL — ABNORMAL HIGH (ref 0.61–1.24)
GFR, Estimated: 29 mL/min — ABNORMAL LOW (ref >60)
Glucose, Bld: 139 mg/dL — ABNORMAL HIGH (ref 70–99)
Potassium: 3.5 mmol/L (ref 3.5–5.1)
Sodium: 130 mmol/L — ABNORMAL LOW (ref 135–145)
Total Bilirubin: 1.7 mg/dL — ABNORMAL HIGH (ref 0.0–1.2)
Total Protein: 7.1 g/dL (ref 6.5–8.1)

## 2024-05-29 LAB — GLUCOSE, CAPILLARY
Glucose-Capillary: 189 mg/dL — ABNORMAL HIGH (ref 70–99)
Glucose-Capillary: 137 mg/dL — ABNORMAL HIGH (ref 70–99)
Glucose-Capillary: 222 mg/dL — ABNORMAL HIGH (ref 70–99)
Glucose-Capillary: 256 mg/dL — ABNORMAL HIGH (ref 70–99)

## 2024-05-29 MED ORDER — SENNOSIDES-DOCUSATE SODIUM 8.6-50 MG PO TABS
1.0000 | ORAL_TABLET | Freq: Two times a day (BID) | ORAL | Status: DC
  Administered 2024-05-29 – 2024-06-03 (×11): 1 via ORAL
  Filled 2024-05-29 (×11): qty 1

## 2024-05-29 MED ORDER — LACTATED RINGERS IV SOLN: INTRAVENOUS | Status: AC

## 2024-05-29 NOTE — Plan of Care (Signed)
  Problem: Nutrition: Goal: Adequate nutrition will be maintained Outcome: Progressing   Problem: Elimination: Goal: Will not experience complications related to bowel motility Outcome: Progressing   Problem: Pain Managment: Goal: General experience of comfort will improve and/or be controlled Outcome: Progressing   Problem: Safety: Goal: Ability to remain free from injury will improve Outcome: Progressing   Problem: Skin Integrity: Goal: Risk for impaired skin integrity will decrease Outcome: Progressing

## 2024-05-29 NOTE — Progress Notes (Signed)
 Mobility Specialist - Progress Note   05/29/24 1316  Mobility  Activity Ambulated with assistance in hallway  Level of Assistance Standby assist, set-up cues, supervision of patient - no hands on  Assistive Device Front wheel walker  Distance Ambulated (ft) 250 ft  Activity Response Tolerated well  Mobility Referral Yes  Mobility visit 1 Mobility  Mobility Specialist Start Time (ACUTE ONLY) 1302  Mobility Specialist Stop Time (ACUTE ONLY) 1316  Mobility Specialist Time Calculation (min) (ACUTE ONLY) 14 min   Pt received in recliner and agreeable to mobility. No complaints during session. Pt to recliner after session with all needs met.    [facility]

## 2024-05-29 NOTE — Evaluation (Signed)
 Physical Therapy Evaluation Patient Details Name: Adam Harris MRN: [id number] DOB: 23-May-1946 Today's Date: 05/29/2024  History of Present Illness  78 y.o. male admitted with complaints of generalized weakness fatigue decreased appetite nausea and lower abdominal pain. Dx of complicated UTI, CKD.  Pt with medical history significant of atrial fibrillation on apixaban , anxiety, arthritis, CAD, NSTEMI, CHF with preserved EF, COVID-19, hematuria, urothelial cancer status post nephrostomy, acquired solitary kidney, stage IV CKD, prostate cancer, fatty liver, hyperlipidemia, hypertension, herpes zoster, thoracic aortic atherosclerosis, type 2 diabetes  Clinical Impression  Pt is mobilizing well, he independently ambulated 280' with a RW without loss of balance. Pt reports he has 24/7 assistance at home from his wife and has needed DME.  PT signing off, mobility team to follow.       If plan is discharge home, recommend the following:     Can travel by private vehicle        Equipment Recommendations None recommended by PT  Recommendations for Other Services       Functional Status Assessment Patient has not had a recent decline in their functional status     Precautions / Restrictions Precautions Recall of Precautions/Restrictions: Intact Precaution/Restrictions Comments: L nephrostomy drain Restrictions Weight Bearing Restrictions Per Provider Order: No      Mobility  Bed Mobility Overal bed mobility: Modified Independent                  Transfers Overall transfer level: Modified independent                      Ambulation/Gait Ambulation/Gait assistance: Modified independent (Device/Increase time) Gait Distance (Feet): 280 Feet Assistive device: Rolling walker (2 wheels) Gait Pattern/deviations: WFL(Within Functional Limits) Gait velocity: WFL     General Gait Details: steady with RW, no loss of balance  Stairs            Wheelchair Mobility      Tilt Bed    Modified Rankin (Stroke Patients Only)       Balance Overall balance assessment: Modified Independent                                           Pertinent Vitals/Pain Pain Assessment Pain Assessment: No/denies pain    Home Living Family/patient expects to be discharged to:: Private residence Living Arrangements: Spouse/significant other Available Help at Discharge: Family;Available 24 hours/day Type of Home: House Home Access: Level entry     Alternate Level Stairs-Number of Steps: flight Home Layout: Two level;1/2 bath on main level;Bed/bath upstairs Home Equipment: Grab bars - tub/shower;Cane - single Librarian, academic (2 wheels)      Prior Function Prior Level of Function : Independent/Modified Independent;Driving             Mobility Comments: pt reports ind with community amb, denies falls in last 6 months, retired high school football coach ADLs Comments: pt reports ind with self care and household chores     Extremity/Trunk Assessment   Upper Extremity Assessment Upper Extremity Assessment: Overall WFL for tasks assessed    Lower Extremity Assessment Lower Extremity Assessment: Overall WFL for tasks assessed    Cervical / Trunk Assessment Cervical / Trunk Assessment: Normal  Communication   Communication Communication: No apparent difficulties    Cognition Arousal: Alert Behavior During Therapy: WFL for tasks assessed/performed   PT - Cognitive impairments: No  apparent impairments                         Following commands: Intact       Cueing       General Comments      Exercises     Assessment/Plan    PT Assessment Patient does not need any further PT services  PT Problem List         PT Treatment Interventions      PT Goals (Current goals can be found in the Care Plan section)  Acute Rehab PT Goals PT Goal Formulation: All assessment and education complete, DC therapy     Frequency       Co-evaluation               AM-PAC PT 6 Clicks Mobility  Outcome Measure Help needed turning from your back to your side while in a flat bed without using bedrails?: None Help needed moving from lying on your back to sitting on the side of a flat bed without using bedrails?: None Help needed moving to and from a bed to a chair (including a wheelchair)?: None Help needed standing up from a chair using your arms (e.g., wheelchair or bedside chair)?: None Help needed to walk in hospital room?: None Help needed climbing 3-5 steps with a railing? : None 6 Click Score: 24    End of Session   Activity Tolerance: Patient tolerated treatment well Patient left: in bed;with family/visitor present;with bed alarm set Nurse Communication: Mobility status      Time: 8650-8590 PT Time Calculation (min) (ACUTE ONLY): 20 min   Charges:   PT Evaluation $PT Eval Low Complexity: 1 Low   PT General Charges $$ ACUTE PT VISIT: 1 Visit        Sylvan Delon Copp PT 05/29/2024  Acute Rehabilitation Services  Office [phone]

## 2024-05-29 NOTE — Progress Notes (Signed)
 [facility]  PROGRESS NOTE  Adam Harris FMW:[id number] DOB: [birth year] DOA: 05/24/2024 PCP: [name], MD   Brief HPI:   78 y.o. male with medical history significant of atrial fibrillation on apixaban , anxiety, arthritis, CAD, NSTEMI, CHF with preserved EF, COVID-19, hematuria, urothelial cancer status post nephrostomy, acquired solitary kidney, stage IV CKD, prostate cancer, fatty liver, hyperlipidemia, hypertension, herpes zoster, thoracic aortic atherosclerosis, type 2 diabetes admitted with complaints of generalized weakness fatigue decreased appetite nausea and lower abdominal pain.  UA shows moderate leukocyte esterase nitrites Many bacteria greater than 50 WBC.  White count was 15.3 on admission.  COVID RSV and influenza were negative.  Creatinine 2.05 on admission.   CT head without contrast no evidence of acute intercranial abnormality.  CT abdomen/pelvis with contrast showing bladder wall thickening diffuse perivesical fat stranding and mild perinephric stranding about the mid and distal ureter.  Differential considerations include progression of bladder cancer versus infection.  New and increased retroperitoneal, iliac chain and perirectal lymph nodes when compared to MRI from 03/31/2023 suggesting metastatic disease.    Assessment/Plan:    Complicated UTI/pyelonephritis in the setting of single kidney and left percutaneous nephrostomy tube.    Started on cefepime  2 g every 12.   Still with leukocytosis, culture shows multiple species CT of the abdomen concerning for progression of malignancy.   Discussed with Dr. Lanny will consulted IR to do a biopsy of the lymph node to rule out malignancy progression.  Since patient got aspirin  on the 14th IR would like to hold off on doing node biopsy for the next 3 days. -Plan for lymph node biopsy on 7/18.     CKD (chronic kidney disease), stage IV (HCC) Creatinine 2.47 from 2.15 from 2.05 (hold Lasix , metolazone , ACE inhibitor, BP  also running soft) Monitor renal function and electrolytes. Continue sodium bicarbonate  650 mg po BID.   DC amlodipine  and metoprolol  due to soft BP.     Essential hypertension -on 6 meds pta.  Hold amlodipine  and metoprolol  since BP running soft. hold Lasix ,imdur , metolazone  and irbesartan   - Blood pressure is soft today, will start LR at 100 mL/h   Chronic diastolic CHF -patient appears dry hold diuretics and ACE -Started on gentle IV fluids as above   Permanent atrial fibrillation (HCC) CHA?DS?-VASc Score of at least 6. Not on anticoagulation. HOLD ASPIRIN  FOR BIOPSY OF LN Holding metoprolol  for soft blood pressure restart as appropriate   Hyperlipidemia Continue rosuvastatin  40 mg p.o. daily.     Type 2 diabetes mellitus with hyperglycemia (HCC) CBG (last 3)   Carbohydrate modified diet. CBG monitoring with RI SS. Hemoglobin A1c 8.3  -CBG well-controlled   Hyponatremia - Likely in setting of diuretic use - Diuretics on hold - Follow serum sodium level in a.m.  Medications     insulin  aspart  0-5 Units Subcutaneous QHS   insulin  aspart  0-6 Units Subcutaneous TID WC   mirtazapine   30 mg Oral QHS   rosuvastatin   40 mg Oral Daily   sodium bicarbonate   650 mg Oral BID   tamsulosin   0.4 mg Oral Daily     Data Reviewed:   CBG:  Recent Labs  Lab 05/28/24 0748 05/28/24 1144 05/28/24 1621 05/28/24 2051 05/29/24 0740  GLUCAP 166* 167* 189* 195* 137*    SpO2: 96 %    Vitals:   05/28/24 1209 05/28/24 1735 05/28/24 2048 05/29/24 0502  BP: 129/77  125/79 139/83  Pulse: (!) 102  (!) 105 99  Resp:  18   18  Temp: 98.7 F (37.1 C) 98.1 F (36.7 C) 98.8 F (37.1 C) 98.4 F (36.9 C)  TempSrc: Oral  Oral Oral  SpO2: 93%  95% 96%  Weight:      Height:          Data Reviewed:  Basic Metabolic Panel: Recent Labs  Lab 05/24/24 2215 05/26/24 0532 05/27/24 0553 05/28/24 0554 05/29/24 0603  NA 133* 132* 132* 130* 130*  K 4.5 4.2 3.9 3.4* 3.5  CL 99  97* 97* 96* 95*  CO2 22 26 28 22 25   GLUCOSE 210* 165* 178* 167* 139*  BUN 52* 41* 44* 38* 34*  CREATININE 2.05* 2.15* 2.47* 2.32* 2.27*  CALCIUM  8.7* 8.7* 8.6* 8.4* 8.3*    CBC: Recent Labs  Lab 05/24/24 2215 05/26/24 0532 05/27/24 0553 05/28/24 0554 05/29/24 0603  WBC 15.3* 20.8* 19.1* 18.8* 17.0*  NEUTROABS 12.0*  --   --   --   --   HGB 10.8* 12.2* 11.6* 11.4* 11.4*  HCT 34.3* 40.3 38.3* 38.4* 36.5*  MCV 75.2* 77.6* 76.6* 78.0* 75.9*  PLT 318 296 263 249 243    LFT Recent Labs  Lab 05/24/24 2215 05/26/24 0532 05/27/24 0553 05/28/24 0554 05/29/24 0603  AST 25 18 15 22 17   ALT 17 15 13 15 12   ALKPHOS 128* 108 117 123 125  BILITOT 0.7 2.1* 1.7* 1.5* 1.7*  PROT 7.7 7.8 7.5 7.0 7.1  ALBUMIN  3.2* 2.7* 2.5* 2.3* 2.1*     Antibiotics: Anti-infectives (From admission, onward)    Start     Dose/Rate Route Frequency Ordered Stop   05/28/24 0800  ceFEPIme  (MAXIPIME ) 2 g in sodium chloride  0.9 % 100 mL IVPB        2 g 200 mL/hr over 30 Minutes Intravenous Every 24 hours 05/27/24 0817     05/25/24 0800  ceFEPIme  (MAXIPIME ) 2 g in sodium chloride  0.9 % 100 mL IVPB  Status:  Discontinued        2 g 200 mL/hr over 30 Minutes Intravenous Every 12 hours 05/25/24 0605 05/27/24 0817   05/25/24 0215  piperacillin -tazobactam (ZOSYN ) IVPB 3.375 g        3.375 g 100 mL/hr over 30 Minutes Intravenous  Once 05/25/24 0210 05/25/24 0248        DVT prophylaxis: SCDs  Code Status: Full code  Family Communication: No family at bedside   CONSULTS    Subjective   Denies any complaints.    Objective    Physical Examination:  General-appears in no acute distress Heart-S1-S2, regular, no murmur auscultated Lungs-clear to auscultation bilaterally, no wheezing or crackles auscultated Abdomen-soft, nontender, no organomegaly Extremities-no edema in the lower extremities Neuro-alert, oriented x3, no focal deficit noted   Status is: Inpatient:              Adam Harris   [facility] If 7PM-7AM, please contact night-coverage at [email or web address], Office  [phone]   05/29/2024, 8:45 AM  LOS: 4 days

## 2024-05-29 NOTE — Consult Note (Signed)
 Chief Complaint: Complicated urinary tract infection/pyelonephritis, recurrent urothelial carcinoma in situ, pelvic adenopathy; referred for image guided iliac versus retroperitoneal lymph node biopsy   Referring Provider(s): Feng,Y  Supervising Physician: Luverne Aran  Patient Status: [facility] - In-pt  History of Present Illness: Adam Harris is a 78 y.o. male with past medical history significant for anxiety, arthritis, atrial fibrillation, coronary artery disease with prior MI, CHF, chronic kidney disease, fatty liver, hypertension, hyperlipidemia, prior Watchman left atrial appendage closure device, diabetes,  urothelial cancer with prior right nephroureterectomy, recent severe hemorrhagic cystitis, diverting left nephrostomy on 03/07/2024 who was recently admitted with generalized weakness, fatigue, decreased appetite, lower abdominal pain, nausea, complex UTI/pyelonephritis.  CT abdomen pelvis performed on 7/13 revealed bladder wall thickening in addition to new and increased retroperitoneal, iliac chain and perirectal lymph nodes concerning for metastatic disease.  Request now received from oncology for image guided iliac versus retroperitoneal lymph node biopsy.   Patient is Full Code  Past Medical History:  Diagnosis Date   Abnormal radiologic findings on diagnostic imaging of renal pelvis, ureter, or bladder    bilateral ureter abnormalities   Anticoagulant long-term use    eliquis    Anxiety    pt denies   Arthritis    Atrial fibrillation, chronic (HCC)    CAD (coronary artery disease) cardiologist-- dr hochrein   NSTEMI 02-04-2014  per cardiac cath chronic occluded RCA w/ faint left-to-right collaterals and aneurysmal LCFx with sluggish coronary flow/   NSTEMI --11-21-2016 per cardiac cath occluded proximal RCA & mid to diastal CFX 100%, med rx. If that does not work, PTCA or CABG   Cancer [facility])    bladder and kidney   CHF (congestive heart failure) (HCC)    CKD (chronic  kidney disease), stage III (HCC)    patient unaware   DOE (dyspnea on exertion)    Fatty liver    pt denies   Hematuria 02/2019   History of COVID-19 10/2019   History of non-ST elevation myocardial infarction (NSTEMI)    02-04-2014  and 11-21-2016  cardiac cath done both times ,  medically management   History of shingles 12/2017   slight pain and numbness still noted in the area   Hyperlipidemia    Hypertension    Insomnia    Myocardial infarction [facility]) 2015   Persistent atrial fibrillation [facility])    cardiologsit-- dr hochrein   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 Kennyth   Thoracic aortic atherosclerosis (HCC)    Type 2 diabetes mellitus (HCC)    Urinary frequency     Past Surgical History:  Procedure Laterality Date   CARDIAC CATHETERIZATION N/A 11/21/2016   Procedure: Left Heart Cath and Coronary Angiography;  Surgeon: Debby DELENA Sor, MD;  Location: [facility] INVASIVE CV LAB;  Service: Cardiovascular;  Laterality: N/A;  pRCA 100% , ostial LAD 45%, OM3 80%, mCFX to dCFX 100% (AV groove), lateral OM3 50%   COLONOSCOPY     CYSTOSCOPY WITH BIOPSY N/A 08/12/2020   Procedure: CYSTOSCOPY WITH BLADDER BIOPSY AND TRANSURETHRAL RESECTION OF BLADDER TUMOR;  Surgeon: [name], MD;  Location: [facility] ORS;  Service: Urology;  Laterality: N/A;   CYSTOSCOPY WITH BIOPSY N/A 11/10/2021   Procedure: CYSTOSCOPY WITH BLADDER BIOPSIES/ LEFT RETROGRADE;  Surgeon: [name], MD;  Location: [facility] ORS;  Service: Urology;  Laterality: N/A;   CYSTOSCOPY WITH BIOPSY Left 07/27/2022   Procedure: CYSTOSCOPY WITH  BLADDER BIOPSY, TRANSURETHRAL FULGERATION  OF BLADDER, EXAM UNDER ANESTHESIA, LEFT RETROGRADE PYELOGRAM;  Surgeon: [name], MD;  Location: [facility] ORS;  Service: Urology;  Laterality: Left;   CYSTOSCOPY WITH BIOPSY N/A 04/23/2023   Procedure: CYSTOSCOPY WITH BLADDER AND PROSTATIC URETHRAL BIOPSIES;  Surgeon: [name], MD;   Location: [facility]S;  Service: Urology;  Laterality: N/A;  60 MINUTES NEEDED FOR CASE   CYSTOSCOPY WITH BIOPSY N/A 03/06/2024   Procedure: CYSTOSCOPY, WITH BIOPSY;  Surgeon: [name], MD;  Location: [facility];  Service: Urology;  Laterality: N/A;  CYSTOSCOPY WITH BLADDER BIOPSIES   CYSTOSCOPY WITH FULGERATION N/A 03/06/2024   Procedure: CYSTOSCOPY, WITH BLADDER FULGURATION, TURBT;  Surgeon: [name], MD;  Location: [facility];  Service: Urology;  Laterality: N/A;   CYSTOSCOPY WITH URETEROSCOPY AND STENT PLACEMENT Right 12/15/2019   Procedure: CYSTOSCOPY WITH RIGHT RETROGRADE/ RIGHT URETEROSCOPY/ BIOPSY;  Surgeon: Ottelin, Mark, MD;  Location: [facility];  Service: Urology;  Laterality: Right;   CYSTOSCOPY/RETROGRADE/URETEROSCOPY Bilateral 03/18/2018   Procedure: CYSTOSCOPY/RETROGRADE/URETEROSCOPY.;  Surgeon: Ottelin, Mark, MD;  Location: [facility];  Service: Urology;  Laterality: Bilateral;   EYE SURGERY Bilateral    cateract in January 2023   HYDROCELE EXCISION Left 07/27/2022   Procedure: HYDROCELE REPAIR;  Surgeon: [name], MD;  Location: [facility];  Service: Urology;  Laterality: Left;  GENERAL ANESTHESIA WITH PARALYSIS   IR NEPHROSTOMY EXCHANGE LEFT  03/10/2024   IR NEPHROSTOMY EXCHANGE LEFT  04/28/2024   IR NEPHROSTOMY PLACEMENT LEFT  03/07/2024   KNEE ARTHROSCOPY Right    LEFT ATRIAL APPENDAGE OCCLUSION N/A 12/13/2023   Procedure: LEFT ATRIAL APPENDAGE OCCLUSION;  Surgeon: [name], MD;  Location[facility] INVASIVE CV LAB;  Service: Cardiovascular;  Laterality: N/A;   LEFT HEART CATHETERIZATION WITH CORONARY ANGIOGRAM N/A 02/04/2014   Procedure: LEFT HEART CATHETERIZATION WITH CORONARY ANGIOGRAM;  Surgeon: Deatrice DELENA Cage, MD;  Location: [facility] CATH LAB;  Service: Cardiovascular;  Laterality: N/A;  severe one-vessel CAD, chronically occluded RCA with faint left-to-right collaterals;  aneurysmal LCFx with sluggish coronary flow;  normal LVSF w/ moderately  elevated LVEDP (ostialOM2 20%, pOM3 20%, pD1 20%, mCFX 50%, diffuse 20% pCFX)   PROSTATE BIOPSY N/A 08/12/2020   Procedure: BIOPSY TRANSRECTAL ULTRASONIC PROSTATE (TUBP);  Surgeon: [name], MD;  Location: [facility];  Service: Urology;  Laterality: N/A;   ROBOT ASSITED LAPAROSCOPIC NEPHROURETERECTOMY Right 03/22/2020   Procedure: XI ROBOT ASSITED LAPAROSCOPIC NEPHROURETERECTOMY;  Surgeon: [name], MD;  Location: [facility];  Service: Urology;  Laterality: Right;   TRANSESOPHAGEAL ECHOCARDIOGRAM (CATH LAB) N/A 12/13/2023   Procedure: TRANSESOPHAGEAL ECHOCARDIOGRAM;  Surgeon: [name], MD;  Location: [facility] INVASIVE CV LAB;  Service: Cardiovascular;  Laterality: N/A;   TRANSESOPHAGEAL ECHOCARDIOGRAM (CATH LAB) N/A 02/11/2024   Procedure: TRANSESOPHAGEAL ECHOCARDIOGRAM;  Surgeon: Barbaraann Darryle Ned, MD;  Location: [facility] INVASIVE CV LAB;  Service: Cardiovascular;  Laterality: N/A;   TRANSTHORACIC ECHOCARDIOGRAM  11-22-2016   dr hochrein   ef 55-60%/ mild MR and TR/ moderate LAE   TRANSURETHRAL RESECTION OF BLADDER TUMOR N/A 02/10/2021   Procedure: TRANSURETHRAL RESECTION OF BLADDER TUMOR (TURBT)/ CYSTOSCOPY/ LEFT RETROGRADE;  Surgeon: [name], MD;  Location: [facility];  Service: Urology;  Laterality: N/A;  GENERAL ANESTHESIA WITH PARALYSIS    Allergies: Xarelto  [rivaroxaban ]  Medications: Prior to Admission medications   Medication Sig Start Date End Date Taking? Authorizing Provider  amLODipine  (NORVASC ) 5 MG tablet TAKE 1 TABLET (5 MG TOTAL) BY MOUTH DAILY 12/05/23  Yes [name], MD  aspirin  EC 81 MG tablet Take 1  tablet (81 mg total) by mouth daily. Swallow whole. 03/13/24  Yes [name], MD  furosemide  (LASIX ) 20 MG tablet Take 1 tablet (20 mg total) by mouth daily. For the next 3 days take 40 mg (2 tabs) daily then resume one tab daily. 02/15/24  Yes [name], MD  hydrOXYzine  (VISTARIL ) 25 MG capsule Take 1 capsule (25 mg total) by mouth every 8 (eight) hours as  needed. 05/11/23  Yes [name], MD  isosorbide  mononitrate (IMDUR ) 30 MG 24 hr tablet TAKE ONE TABLET BY MOUTH DAILY 08/24/23  Yes [name], MD  metolazone  (ZAROXOLYN ) 2.5 MG tablet Take 1 tablet (2.5 mg total) by mouth daily. 05/06/24  Yes [name], MD  metoprolol  tartrate (LOPRESSOR ) 25 MG tablet TAKE ONE TABLET BY MOUTH TWICE A DAY 06/11/23  Yes Hochrein, Agent, MD  nitroGLYCERIN  (NITROSTAT ) 0.4 MG SL tablet TAKE ONE TABLET UNDER THE TONGUE EVERY 5 MINS FOR THREE DOSES AS NEEDED FOR CHEST PAIN. CALL 911 IF 2ND DOSE DOESN'T HELP 04/10/24  Yes [name], MD  olmesartan  (BENICAR ) 40 MG tablet Take 1 tablet (40 mg total) by mouth daily. Patient taking differently: Take 20 mg by mouth daily. 04/18/24  Yes [name], MD  ondansetron  (ZOFRAN ) 8 MG tablet Take 1 tablet (8 mg total) by mouth every 8 (eight) hours as needed for nausea or vomiting. 05/01/24  Yes Burton, Lacie K, NP  rosuvastatin  (CRESTOR ) 40 MG tablet TAKE 1 TABLET (40 MG TOTAL) BY MOUTH EVERY MORNING. PLEASE KEEP SCHEDULED APPOINTMENT 08/24/23  Yes [name], MD  sitaGLIPtin  (JANUVIA ) 50 MG tablet Take 1 tablet (50 mg total) by mouth daily. 05/19/24  Yes [name], MD  sodium bicarbonate  650 MG tablet Take 650 mg by mouth 2 (two) times daily.   Yes [provider]  tamsulosin  (FLOMAX ) 0.4 MG CAPS capsule TAKE ONE CAPSULE BY MOUTH DAILY 02/22/24  Yes [name], MD  traZODone  (DESYREL ) 50 MG tablet Take 0.5-1 tablets (25-50 mg total) by mouth at bedtime as needed for sleep. 10/16/23  Yes [name], MD  acetaminophen -codeine  (TYLENOL  #3) 300-30 MG tablet Take 1 tablet by mouth every 8 (eight) hours as needed for moderate pain (pain score 4-6). Patient not taking: Reported on 05/25/2024 05/01/24   Burton, Lacie K, NP  Cholecalciferol  (VITAMIN D3) 1000 units CAPS Take 1,000 Units by mouth daily. Patient not taking: Reported on 05/25/2024    [provider]  Pembrolizumab  (KEYTRUDA  IV)  Inject into the vein every 14 (fourteen) days. Patient not taking: Reported on 05/25/2024    [provider]     Family History  Problem Relation Age of Onset   Hypertension Mother    Cancer Mother        anal cancer   Esophageal cancer Neg Hx    Pancreatic cancer Neg Hx    Stomach cancer Neg Hx    Liver disease Neg Hx     Social History   Socioeconomic History   Marital status: Married    Spouse name: Devere   Number of children: 3   Years of education: Not on file   Highest education level: Master's degree (e.g., MA, MS, MEng, MEd, MSW, MBA)  Occupational History   Occupation: Retired- Psychologist, occupational    Comment: [employer]  Tobacco Use   Smoking status: Former    Current packs/day: 0.00    Average packs/day: 0.3 packs/day for 4.0 years (1.0 ttl pk-yrs)    Types: Cigarettes  Start date: 62    Quit date: 39    Years since quitting: 53.5    Passive exposure: Past   Smokeless tobacco: Former    Types: Chew    Quit date: 1972  Vaping Use   Vaping status: Never Used  Substance and Sexual Activity   Alcohol use: No   Drug use: No   Sexual activity: Yes  Other Topics Concern   Not on file  Social History Narrative   Not on file   Social Drivers of Health   Financial Resource Strain: Low Risk  (08/06/2023)   Overall Financial Resource Strain (CARDIA)    Difficulty of Paying Living Expenses: Not hard at all  Food Insecurity: No Food Insecurity (05/25/2024)   Hunger Vital Sign    Worried About Running Out of Food in the Last Year: Never true    Ran Out of Food in the Last Year: Never true  Transportation Needs: No Transportation Needs (05/25/2024)   PRAPARE - Administrator, Civil Service (Medical): No    Lack of Transportation (Non-Medical): No  Physical Activity: Inactive (08/06/2023)   Exercise Vital Sign    Days of Exercise per Week: 0 days    Minutes of Exercise per Session: 0 min  Stress: No Stress Concern Present (08/06/2023)    [employer] of Occupational Health - Occupational Stress Questionnaire    Feeling of Stress : Not at all  Social Connections: Moderately Integrated (05/25/2024)   Social Connection and Isolation Panel    Frequency of Communication with Friends and Family: More than three times a week    Frequency of Social Gatherings with Friends and Family: More than three times a week    Attends Religious Services: More than 4 times per year    Active Member of [employer] or Organizations: No    Attends Banker Meetings: Never    Marital Status: Married       Review of Systems: Currently denies fever, headache, chest pain, dyspnea, cough, nausea, vomiting or bleeding.  He does have some intermittent abdominal and back discomfort  Vital Signs: BP 100/71 (BP Location: Left Arm)   Pulse (!) 125   Temp 98.8 F (37.1 C) (Oral)   Resp 20   Ht 5' 11 (1.803 m)   Wt 197 lb 8.5 oz (89.6 kg)   SpO2 97%   BMI 27.55 kg/m   Advance Care Plan: No documents on file  Physical Exam patient awake, answering questions okay.  Chest clear to auscultation bilaterally.  Heart with tachycardic rate, irregular rhythm.  Abdomen soft, positive bowel sounds, currently nontender.  No lower extremity edema.  Intact left nephrostomy draining yellow urine.  Imaging: CT ABDOMEN PELVIS WO CONTRAST Result Date: 05/25/2024 CLINICAL DATA:  Acute nonlocalized abdominal pain. Left flank pain. Generalized abdominal tenderness. Fatigue, weakness, difficulty sleeping. Left nephrostomy EXAM: CT ABDOMEN AND PELVIS WITHOUT CONTRAST TECHNIQUE: Multidetector CT imaging of the abdomen and pelvis was performed following the standard protocol without IV contrast. RADIATION DOSE REDUCTION: This exam was performed according to the departmental dose-optimization program which includes automated exposure control, adjustment of the mA and/or kV according to patient size and/or use of iterative reconstruction technique. COMPARISON:  MRI  03/22/2023 and CT 02/27/2022 FINDINGS: Lower chest: No acute abnormality. Hepatobiliary: Unremarkable noncontrast appearance of the liver, gallbladder, and biliary tree. Pancreas: Unremarkable. Spleen: Unremarkable. Adrenals/Urinary Tract: Unremarkable adrenal glands. Absent right kidney. Left nephrostomy tube. Small locule of gas in the left renal calices. No hydronephrosis.  Mild perinephric stranding about the mid and distal ureter. Nondistended bladder. Diffuse perivesical fat stranding. Stomach/Bowel: Normal caliber large and small bowel. No bowel wall thickening. Stomach and appendix are within normal limits. Vascular/Lymphatic: New or increased retroperitoneal and iliac chain lymph nodes compared to MRI 03/31/2023. For example a 1.4 cm right periaortic lymph node on series 5/image 35; a 1.6 cm right iliac chain lymph node on 5/70; and a 2.2 cm left iliac chain lymph node on 5/67 (this previously measured 0.9 cm on 03/31/2023). New perirectal nodes, for example on 5/76 measuring 1.2 cm. Reproductive: Enlarged prostate. Other: Stranding and small volume free fluid in the pelvis. No free intraperitoneal air. No abscess. Musculoskeletal: No acute fracture or destructive osseous lesion. IMPRESSION: 1. Bladder wall thickening. Diffuse perivesical fat stranding and mild perinephric stranding about the mid and distal ureter. Differential considerations include progression of bladder cancer versus infection. 2. New and increased retroperitoneal, iliac chain, and perirectal lymph nodes compared to MRI 03/31/2023 suggesting metastatic disease. Electronically Signed   By: Norman Gatlin M.D.   On: 05/25/2024 00:43   CT Head Wo Contrast Result Date: 05/25/2024 CLINICAL DATA:  Mental status change, unknown cause confusion, progressive weakness, hx/o urothelial cancer EXAM: CT HEAD WITHOUT CONTRAST TECHNIQUE: Contiguous axial images were obtained from the base of the skull through the vertex without intravenous contrast.  RADIATION DOSE REDUCTION: This exam was performed according to the departmental dose-optimization program which includes automated exposure control, adjustment of the mA and/or kV according to patient size and/or use of iterative reconstruction technique. COMPARISON:  None Available. FINDINGS: Brain: No evidence of acute infarction, hemorrhage, hydrocephalus, extra-axial collection or obvious mass lesion/mass effect. Vascular: No hyperdense vessel or unexpected calcification. Skull: Normal. Negative for fracture or focal lesion. Sinuses/Orbits: Clear sinuses.  No acute orbital findings. IMPRESSION: No evidence of acute intracranial abnormality. Electronically Signed   By: Gilmore GORMAN Molt M.D.   On: 05/25/2024 00:34    Labs:  CBC: Recent Labs    05/26/24 0532 05/27/24 0553 05/28/24 0554 05/29/24 0603  WBC 20.8* 19.1* 18.8* 17.0*  HGB 12.2* 11.6* 11.4* 11.4*  HCT 40.3 38.3* 38.4* 36.5*  PLT 296 263 249 243    COAGS: Recent Labs    12/23/23 0025 03/07/24 0806 05/26/24 1014  INR 1.4* 1.6* 1.6*  APTT 60* 41*  --     BMP: Recent Labs    05/26/24 0532 05/27/24 0553 05/28/24 0554 05/29/24 0603  NA 132* 132* 130* 130*  K 4.2 3.9 3.4* 3.5  CL 97* 97* 96* 95*  CO2 26 28 22 25   GLUCOSE 165* 178* 167* 139*  BUN 41* 44* 38* 34*  CALCIUM  8.7* 8.6* 8.4* 8.3*  CREATININE 2.15* 2.47* 2.32* 2.27*  GFRNONAA 31* 26* 28* 29*    LIVER FUNCTION TESTS: Recent Labs    05/26/24 0532 05/27/24 0553 05/28/24 0554 05/29/24 0603  BILITOT 2.1* 1.7* 1.5* 1.7*  AST 18 15 22 17   ALT 15 13 15 12   ALKPHOS 108 117 123 125  PROT 7.8 7.5 7.0 7.1  ALBUMIN  2.7* 2.5* 2.3* 2.1*    TUMOR MARKERS: No results for input(s): AFPTM, CEA, CA199, CHROMGRNA in the last 8760 hours.  Assessment and Plan: 78 y.o. male with past medical history significant for anxiety, arthritis, atrial fibrillation, coronary artery disease with prior MI, CHF, chronic kidney disease, fatty liver, hypertension,  hyperlipidemia, prior Watchman left atrial appendage closure device, diabetes,  urothelial cancer with prior right nephroureterectomy, recent severe hemorrhagic cystitis, diverting left nephrostomy on 03/07/2024  who was recently admitted with generalized weakness, fatigue, decreased appetite, lower abdominal pain, nausea, complex UTI/pyelonephritis.  CT abdomen pelvis performed on 7/13 revealed bladder wall thickening in addition to new and increased retroperitoneal, iliac chain and perirectal lymph nodes concerning for metastatic disease.  Request now received from oncology for image guided iliac versus retroperitoneal lymph node biopsy.  Imaging studies have been reviewed by Dr. Luverne.Risks and benefits of procedure was discussed with the patient  including, but not limited to bleeding, infection, damage to adjacent structures or low yield requiring additional tests.  All of the questions were answered and there is agreement to proceed.  Consent signed and in chart.  Procedure scheduled for 7/18  Thank you for allowing our service to participate in Adam Harris 's care.  Electronically Signed: D. Franky Rakers, PA-C   05/29/2024, 4:04 PM      I spent a total of  30 minutes    in face to face in clinical consultation, greater than 50% of which was counseling/coordinating care for image guided iliac versus retroperitoneal lymph node biopsy

## 2024-05-30 ENCOUNTER — Inpatient Hospital Stay (HOSPITAL_COMMUNITY)

## 2024-05-30 DIAGNOSIS — R531 Weakness: Secondary | ICD-10-CM | POA: Diagnosis not present

## 2024-05-30 DIAGNOSIS — I5032 Chronic diastolic (congestive) heart failure: Secondary | ICD-10-CM | POA: Diagnosis not present

## 2024-05-30 DIAGNOSIS — N390 Urinary tract infection, site not specified: Secondary | ICD-10-CM | POA: Diagnosis not present

## 2024-05-30 DIAGNOSIS — I10 Essential (primary) hypertension: Secondary | ICD-10-CM | POA: Diagnosis not present

## 2024-05-30 LAB — COMPREHENSIVE METABOLIC PANEL WITH GFR
ALT: 14 U/L (ref 0–44)
AST: 20 U/L (ref 15–41)
Albumin: 2 g/dL — ABNORMAL LOW (ref 3.5–5.0)
Alkaline Phosphatase: 124 U/L (ref 38–126)
Anion gap: 9 (ref 5–15)
BUN: 33 mg/dL — ABNORMAL HIGH (ref 8–23)
CO2: 25 mmol/L (ref 22–32)
Calcium: 8.4 mg/dL — ABNORMAL LOW (ref 8.9–10.3)
Chloride: 96 mmol/L — ABNORMAL LOW (ref 98–111)
Creatinine, Ser: 2.36 mg/dL — ABNORMAL HIGH (ref 0.61–1.24)
GFR, Estimated: 28 mL/min — ABNORMAL LOW (ref >60)
Glucose, Bld: 193 mg/dL — ABNORMAL HIGH (ref 70–99)
Potassium: 3.6 mmol/L (ref 3.5–5.1)
Sodium: 130 mmol/L — ABNORMAL LOW (ref 135–145)
Total Bilirubin: 1.4 mg/dL — ABNORMAL HIGH (ref 0.0–1.2)
Total Protein: 6.8 g/dL (ref 6.5–8.1)

## 2024-05-30 LAB — CBC
HCT: 36.2 % — ABNORMAL LOW (ref 39.0–52.0)
Hemoglobin: 10.9 g/dL — ABNORMAL LOW (ref 13.0–17.0)
MCH: 23.4 pg — ABNORMAL LOW (ref 26.0–34.0)
MCHC: 30.1 g/dL (ref 30.0–36.0)
MCV: 77.7 fL — ABNORMAL LOW (ref 80.0–100.0)
Platelets: 219 K/uL (ref 150–400)
RBC: 4.66 MIL/uL (ref 4.22–5.81)
RDW: 19.3 % — ABNORMAL HIGH (ref 11.5–15.5)
WBC: 16.7 K/uL — ABNORMAL HIGH (ref 4.0–10.5)
nRBC: 0 % (ref 0.0–0.2)

## 2024-05-30 LAB — PROTIME-INR
INR: 1.5 — ABNORMAL HIGH (ref 0.8–1.2)
Prothrombin Time: 18.7 s — ABNORMAL HIGH (ref 11.4–15.2)

## 2024-05-30 LAB — GLUCOSE, CAPILLARY
Glucose-Capillary: 199 mg/dL — ABNORMAL HIGH (ref 70–99)
Glucose-Capillary: 180 mg/dL — ABNORMAL HIGH (ref 70–99)
Glucose-Capillary: 151 mg/dL — ABNORMAL HIGH (ref 70–99)

## 2024-05-30 LAB — CULTURE, BLOOD (ROUTINE X 2)
Culture: NO GROWTH
Special Requests: ADEQUATE

## 2024-05-30 MED ORDER — SODIUM CHLORIDE 0.9 % IV SOLN
INTRAVENOUS | Status: AC
  Filled 2024-05-30: qty 250

## 2024-05-30 MED ORDER — MIDAZOLAM HCL 2 MG/2ML IJ SOLN
INTRAMUSCULAR | Status: AC | PRN
  Administered 2024-05-30: .5 mg via INTRAVENOUS

## 2024-05-30 MED ORDER — FENTANYL CITRATE (PF) 100 MCG/2ML IJ SOLN
INTRAMUSCULAR | Status: AC | PRN
  Administered 2024-05-30: 25 ug via INTRAVENOUS

## 2024-05-30 MED ORDER — MIDAZOLAM HCL 2 MG/2ML IJ SOLN
INTRAMUSCULAR | Status: AC
  Filled 2024-05-30: qty 4

## 2024-05-30 MED ORDER — FENTANYL CITRATE (PF) 100 MCG/2ML IJ SOLN
INTRAMUSCULAR | Status: AC
  Filled 2024-05-30: qty 4

## 2024-05-30 NOTE — Progress Notes (Signed)
 Patient ID: Adam Harris, male   DOB: [birth year], 78 y.o.   MRN: [id number]    Subjective: Feeling improved most of the time.  Scheduled for IR lymph node biopsy today.  Objective: Vital signs in last 24 hours: Temp:  [97.8 F (36.6 C)-98.9 F (37.2 C)] 97.8 F (36.6 C) (07/18 0637) Pulse Rate:  [83-125] 83 (07/18 0637) Resp:  [18-20] 20 (07/18 0637) BP: (93-148)/(71-120) 126/87 (07/18 0637) SpO2:  [97 %-100 %] 97 % (07/18 0637)  Intake/Output from previous day: 07/17 0701 - 07/18 0700 In: 1040 [P.O.:840; IV Piggyback:200] Out: 1450 [Urine:1450] Intake/Output this shift: No intake/output data recorded.  Physical Exam:  General: Alert and oriented Abdomen: Soft, ND, L PCN draining well and with clear urine Ext: NT, No erythema  Lab Results: Recent Labs    05/28/24 0554 05/29/24 0603 05/30/24 0521  HGB 11.4* 11.4* 10.9*  HCT 38.4* 36.5* 36.2*      Latest Ref Rng & Units 05/30/2024    5:21 AM 05/29/2024    6:03 AM 05/28/2024    5:54 AM  CBC  WBC 4.0 - 10.5 K/uL 16.7  17.0  18.8   Hemoglobin 13.0 - 17.0 g/dL 89.0  88.5  88.5   Hematocrit 39.0 - 52.0 % 36.2  36.5  38.4   Platelets 150 - 400 K/uL 219  243  249      BMET Recent Labs    05/29/24 0603 05/30/24 0521  NA 130* 130*  K 3.5 3.6  CL 95* 96*  CO2 25 25  GLUCOSE 139* 193*  BUN 34* 33*  CREATININE 2.27* 2.36*  CALCIUM  8.3* 8.4*     Studies/Results: No results found.  Assessment/Plan: 1) Pyelonephritis: Presumed pyelonephritis.  Urine culture with multiple species.  Would continue empiric therapy for 7 days and stop if clinically resolved.  WBC still elevated but trending toward improvement. 2) CKD:  He has an indwelling left PCN from last hospitalization but did not have clear evidence of obstruction.  However, he developed AKI everytime we tried to clamp his PCN.  Will need to restudy him with a nephrostogram but would defer this until after biopsy results. 3) Urothelial carcinoma:  I have communicated  with Dr. Lanny.  I agree with a LN biopsy and this is scheduled today to evaluate lymphadenopathy.   LOS: 5 days   Noretta Ferrara 05/30/2024, 7:38 AM

## 2024-05-30 NOTE — Procedures (Signed)
 Interventional Radiology Procedure Note  Procedure: CT Guided Biopsy of left iliac lymph node  Complications: None  Estimated Blood Loss: < 10 mL  Findings: 18 G core biopsy of 3 cm left external iliac lymph node performed under CT guidance.  Three core samples obtained and sent to Pathology.  Marcey DASEN. Luverne, M.D Pager:  [phone]

## 2024-05-30 NOTE — Plan of Care (Signed)
  Problem: Education: Goal: Knowledge of General Education information will improve Description: Including pain rating scale, medication(s)/side effects and non-pharmacologic comfort measures Outcome: Progressing   Problem: Health Behavior/Discharge Planning: Goal: Ability to manage health-related needs will improve Outcome: Progressing   Problem: Clinical Measurements: Goal: Ability to maintain clinical measurements within normal limits will improve Outcome: Progressing Goal: Will remain free from infection Outcome: Progressing Goal: Diagnostic test results will improve Outcome: Progressing Goal: Respiratory complications will improve Outcome: Progressing Goal: Cardiovascular complication will be avoided Outcome: Progressing   Problem: Activity: Goal: Risk for activity intolerance will decrease Outcome: Progressing   Problem: Coping: Goal: Level of anxiety will decrease Outcome: Progressing   Problem: Elimination: Goal: Will not experience complications related to bowel motility Outcome: Progressing Goal: Will not experience complications related to urinary retention Outcome: Progressing   Problem: Safety: Goal: Ability to remain free from injury will improve Outcome: Progressing   Problem: Skin Integrity: Goal: Risk for impaired skin integrity will decrease Outcome: Progressing   Problem: Skin Integrity: Goal: Risk for impaired skin integrity will decrease Outcome: Progressing   Problem: Tissue Perfusion: Goal: Adequacy of tissue perfusion will improve Outcome: Progressing   Problem: Nutritional: Goal: Maintenance of adequate nutrition will improve Outcome: Progressing Goal: Progress toward achieving an optimal weight will improve Outcome: Progressing

## 2024-05-30 NOTE — Progress Notes (Addendum)
 [facility]  PROGRESS NOTE  Adam Harris FMW:[id number] DOB: May 09, 1946 DOA: 05/24/2024 PCP: [name], MD   Brief HPI:   78 y.o. male with medical history significant of atrial fibrillation on apixaban , anxiety, arthritis, CAD, NSTEMI, CHF with preserved EF, COVID-19, hematuria, urothelial cancer status post nephrostomy, acquired solitary kidney, stage IV CKD, prostate cancer, fatty liver, hyperlipidemia, hypertension, herpes zoster, thoracic aortic atherosclerosis, type 2 diabetes admitted with complaints of generalized weakness fatigue decreased appetite nausea and lower abdominal pain.  UA shows moderate leukocyte esterase nitrites Many bacteria greater than 50 WBC.  White count was 15.3 on admission.  COVID RSV and influenza were negative.  Creatinine 2.05 on admission.   CT head without contrast no evidence of acute intercranial abnormality.  CT abdomen/pelvis with contrast showing bladder wall thickening diffuse perivesical fat stranding and mild perinephric stranding about the mid and distal ureter.  Differential considerations include progression of bladder cancer versus infection.  New and increased retroperitoneal, iliac chain and perirectal lymph nodes when compared to MRI from 03/31/2023 suggesting metastatic disease.    Assessment/Plan:    Complicated UTI/pyelonephritis in the setting of single kidney and left percutaneous nephrostomy tube.    Started on cefepime  2 g every 12.  Urology recommends to complete 7 days of treatment. Still with leukocytosis, culture shows multiple species CT of the abdomen concerning for progression of malignancy.   Discussed with Dr. Lanny will consulted IR to do a biopsy of the lymph node to rule out malignancy progression.  Since patient got aspirin  on the 14th IR would like to hold off on doing node biopsy for the next 3 days. - Underwent left iliac lymph node biopsy today. -Result is currently pending     CKD (chronic kidney disease), stage  IV (HCC) Creatinine 2.47 from 2.15 from 2.05 (hold Lasix , metolazone , ACE inhibitor, BP also running soft) Monitor renal function and electrolytes. Continue sodium bicarbonate  650 mg po BID.   DC amlodipine  and metoprolol  due to soft BP.     Essential hypertension -on 6 meds pta.  Hold amlodipine  and metoprolol  since BP running soft. hold Lasix ,imdur , metolazone  and irbesartan   - Blood pressure is soft today, will start LR at 100 mL/h   Chronic diastolic CHF -patient appears dry hold diuretics and ACE -Started on gentle IV fluids as above   Permanent atrial fibrillation (HCC) CHA?DS?-VASc Score of at least 6. Not on anticoagulation. HOLD ASPIRIN  FOR BIOPSY OF LN Holding metoprolol  for soft blood pressure restart as appropriate   Hyperlipidemia Continue rosuvastatin  40 mg p.o. daily.     Type 2 diabetes mellitus with hyperglycemia (HCC) CBG (last 3)   Carbohydrate modified diet. CBG monitoring with RI SS. Hemoglobin A1c 8.3  -CBG well-controlled   Hyponatremia - Likely in setting of diuretic use - Diuretics on hold  Constipation - No improvement with MiraLAX  and Senokot-S tablets - Will do 1 dose of soapsuds enema  Medications     insulin  aspart  0-5 Units Subcutaneous QHS   insulin  aspart  0-6 Units Subcutaneous TID WC   mirtazapine   30 mg Oral QHS   rosuvastatin   40 mg Oral Daily   senna-docusate  1 tablet Oral BID   sodium bicarbonate   650 mg Oral BID   tamsulosin   0.4 mg Oral Daily     Data Reviewed:   CBG:  Recent Labs  Lab 05/29/24 0740 05/29/24 1131 05/29/24 1628 05/29/24 2232 05/30/24 0718  GLUCAP 137* 222* 256* 199* 180*    SpO2: 97 %  Vitals:   05/29/24 1625 05/29/24 1720 05/29/24 2235 05/30/24 0637  BP: 93/72 117/74 125/83 126/87  Pulse: (!) 104 97  83  Resp: 18  20 20   Temp:   98.2 F (36.8 C) 97.8 F (36.6 C)  TempSrc:   Oral Oral  SpO2: 97%  97% 97%  Weight:      Height:          Data Reviewed:  Basic Metabolic  Panel: Recent Labs  Lab 05/26/24 0532 05/27/24 0553 05/28/24 0554 05/29/24 0603 05/30/24 0521  NA 132* 132* 130* 130* 130*  K 4.2 3.9 3.4* 3.5 3.6  CL 97* 97* 96* 95* 96*  CO2 26 28 22 25 25   GLUCOSE 165* 178* 167* 139* 193*  BUN 41* 44* 38* 34* 33*  CREATININE 2.15* 2.47* 2.32* 2.27* 2.36*  CALCIUM  8.7* 8.6* 8.4* 8.3* 8.4*    CBC: Recent Labs  Lab 05/24/24 2215 05/26/24 0532 05/27/24 0553 05/28/24 0554 05/29/24 0603 05/30/24 0521  WBC 15.3* 20.8* 19.1* 18.8* 17.0* 16.7*  NEUTROABS 12.0*  --   --   --   --   --   HGB 10.8* 12.2* 11.6* 11.4* 11.4* 10.9*  HCT 34.3* 40.3 38.3* 38.4* 36.5* 36.2*  MCV 75.2* 77.6* 76.6* 78.0* 75.9* 77.7*  PLT 318 296 263 249 243 219    LFT Recent Labs  Lab 05/26/24 0532 05/27/24 0553 05/28/24 0554 05/29/24 0603 05/30/24 0521  AST 18 15 22 17 20   ALT 15 13 15 12 14   ALKPHOS 108 117 123 125 124  BILITOT 2.1* 1.7* 1.5* 1.7* 1.4*  PROT 7.8 7.5 7.0 7.1 6.8  ALBUMIN  2.7* 2.5* 2.3* 2.1* 2.0*     Antibiotics: Anti-infectives (From admission, onward)    Start     Dose/Rate Route Frequency Ordered Stop   05/28/24 0800  ceFEPIme  (MAXIPIME ) 2 g in sodium chloride  0.9 % 100 mL IVPB        2 g 200 mL/hr over 30 Minutes Intravenous Every 24 hours 05/27/24 0817 05/31/24 2359   05/25/24 0800  ceFEPIme  (MAXIPIME ) 2 g in sodium chloride  0.9 % 100 mL IVPB  Status:  Discontinued        2 g 200 mL/hr over 30 Minutes Intravenous Every 12 hours 05/25/24 0605 05/27/24 0817   05/25/24 0215  piperacillin -tazobactam (ZOSYN ) IVPB 3.375 g        3.375 g 100 mL/hr over 30 Minutes Intravenous  Once 05/25/24 0210 05/25/24 0248        DVT prophylaxis: SCDs  Code Status: Full code  Family Communication: No family at bedside   CONSULTS    Subjective   Underwent CT-guided biopsy of left leg lymph node.  Complains of constipation.  Has not had BM in 9 days.   Objective    Physical Examination:   General-appears in no acute  distress Heart-S1-S2, regular, no murmur auscultated Lungs-clear to auscultation bilaterally, no wheezing or crackles auscultated Abdomen-soft, nontender, no organomegaly Extremities-no edema in the lower extremities Neuro-alert, oriented x3, no focal deficit noted  Status is: Inpatient:             Adam Harris   [facility] If 7PM-7AM, please contact night-coverage at [email or web address], Office  [phone]   05/30/2024, 8:28 AM  LOS: 5 days

## 2024-05-30 NOTE — Progress Notes (Addendum)
 Patient unable to have a BM for 9 days. Dr Drusilla made aware, orders in,Enema administered.Patient Passed out some separate  hard lumps, Followed by a large Mushy -like BM  .Patient Alert and oriented , follows commands asked this nurse to permit him stay longer on the [facility]. This Nurse instructed him to call when he is done. Call bell within reach. No further concerns.

## 2024-05-31 DIAGNOSIS — N184 Chronic kidney disease, stage 4 (severe): Secondary | ICD-10-CM

## 2024-05-31 DIAGNOSIS — Z936 Other artificial openings of urinary tract status: Secondary | ICD-10-CM

## 2024-05-31 DIAGNOSIS — N390 Urinary tract infection, site not specified: Secondary | ICD-10-CM | POA: Diagnosis not present

## 2024-05-31 DIAGNOSIS — R531 Weakness: Secondary | ICD-10-CM | POA: Diagnosis not present

## 2024-05-31 DIAGNOSIS — I5032 Chronic diastolic (congestive) heart failure: Secondary | ICD-10-CM | POA: Diagnosis not present

## 2024-05-31 DIAGNOSIS — I10 Essential (primary) hypertension: Secondary | ICD-10-CM | POA: Diagnosis not present

## 2024-05-31 LAB — COMPREHENSIVE METABOLIC PANEL WITH GFR
ALT: 16 U/L (ref 0–44)
AST: 26 U/L (ref 15–41)
Albumin: 2.1 g/dL — ABNORMAL LOW (ref 3.5–5.0)
Alkaline Phosphatase: 142 U/L — ABNORMAL HIGH (ref 38–126)
Anion gap: 8 (ref 5–15)
BUN: 32 mg/dL — ABNORMAL HIGH (ref 8–23)
CO2: 25 mmol/L (ref 22–32)
Calcium: 8.4 mg/dL — ABNORMAL LOW (ref 8.9–10.3)
Chloride: 96 mmol/L — ABNORMAL LOW (ref 98–111)
Creatinine, Ser: 2.25 mg/dL — ABNORMAL HIGH (ref 0.61–1.24)
GFR, Estimated: 29 mL/min — ABNORMAL LOW (ref >60)
Glucose, Bld: 149 mg/dL — ABNORMAL HIGH (ref 70–99)
Potassium: 3.6 mmol/L (ref 3.5–5.1)
Sodium: 129 mmol/L — ABNORMAL LOW (ref 135–145)
Total Bilirubin: 1.4 mg/dL — ABNORMAL HIGH (ref 0.0–1.2)
Total Protein: 7 g/dL (ref 6.5–8.1)

## 2024-05-31 LAB — GLUCOSE, CAPILLARY
Glucose-Capillary: 140 mg/dL — ABNORMAL HIGH (ref 70–99)
Glucose-Capillary: 275 mg/dL — ABNORMAL HIGH (ref 70–99)
Glucose-Capillary: 213 mg/dL — ABNORMAL HIGH (ref 70–99)

## 2024-05-31 LAB — CBC
HCT: 37.1 % — ABNORMAL LOW (ref 39.0–52.0)
Hemoglobin: 11.3 g/dL — ABNORMAL LOW (ref 13.0–17.0)
MCH: 23.1 pg — ABNORMAL LOW (ref 26.0–34.0)
MCHC: 30.5 g/dL (ref 30.0–36.0)
MCV: 75.7 fL — ABNORMAL LOW (ref 80.0–100.0)
Platelets: 254 K/uL (ref 150–400)
RBC: 4.9 MIL/uL (ref 4.22–5.81)
RDW: 19.2 % — ABNORMAL HIGH (ref 11.5–15.5)
WBC: 17.4 K/uL — ABNORMAL HIGH (ref 4.0–10.5)
nRBC: 0 % (ref 0.0–0.2)

## 2024-05-31 NOTE — Progress Notes (Signed)
 Patient ID: Adam Harris, male   DOB: [birth year], 78 y.o.   MRN: [id number]    Subjective: Doing well this morning.  Tolerated LN biopsy well yesterday.  Objective: Vital signs in last 24 hours: Temp:  [98 F (36.7 C)-98.2 F (36.8 C)] 98 F (36.7 C) (07/19 0547) Pulse Rate:  [88-106] 103 (07/19 0547) Resp:  [13-26] 16 (07/19 0547) BP: (108-142)/(73-94) 116/73 (07/19 0547) SpO2:  [4 %-100 %] 95 % (07/19 0547)  Intake/Output from previous day: 07/18 0701 - 07/19 0700 In: -  Out: 1500 [Urine:1500] Intake/Output this shift: No intake/output data recorded.  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 The patient presents for lymph node biopsy. EXAM: CT GUIDED CORE BIOPSY OF LEFT ILIAC LYMPH NODE ANESTHESIA/SEDATION: Moderate (conscious) sedation was employed during this procedure. A total of Versed  0.5 mg and Fentanyl  25 mcg was administered intravenously. Moderate Sedation Time: 33 minutes. The patient's level of consciousness and vital signs were monitored continuously by radiology nursing throughout the procedure under my direct supervision. PROCEDURE: The procedure risks, benefits, and alternatives were explained to the patient. Questions regarding the procedure were encouraged and answered. The patient understands and consents to the procedure. A time out was performed prior to initiating the procedure. RADIATION DOSE REDUCTION: This exam was performed according to the departmental  dose-optimization program which includes automated exposure control, adjustment of the mA and/or kV according to patient size and/or use of iterative reconstruction technique. The left lower pelvic abdominal wall was prepped with chlorhexidine  in a sterile fashion, and a sterile drape was applied covering the operative field. A sterile gown and sterile gloves were used for the procedure. Local anesthesia was provided with 1% Lidocaine . Under CT guidance, a 17 gauge trocar needle was advanced to the level of a left external iliac lymph node. After confirming needle tip position, 3 separate 18 gauge coaxial core biopsy samples were obtained and submitted in saline. Gel-Foam pledgets were advanced through the outer needle prior to its retraction and removal. Additional CT was performed. COMPLICATIONS: Minimal hemorrhage anterior to lymph node. FINDINGS: Initial CT demonstrates enlarged left external iliac lymph nodes with the largest measuring up to approximately 3 cm. Solid tissue was obtained with core biopsy. Due to some bleeding emanating from the outer needle with biopsy, Gel-Foam pledgets were advanced through the trocar needle prior to its removal. Post biopsy imaging demonstrates a small amount of hemorrhage anterior to the lymph node after biopsy. IMPRESSION: CT-guided core biopsy performed of enlarged left external iliac lymph node. Electronically Signed   By: Marcey Moan M.D.   On: 05/30/2024 13:18    Assessment/Plan: 1) Pyelonephritis: Presumed pyelonephritis.  Urine culture with multiple species.  Would continue empiric therapy for 7 days and stop if clinically resolved.  WBC still elevated but trending toward improvement.  Labs pending for today. 2) CKD:  He has an indwelling left PCN from last hospitalization but did not  have clear evidence of obstruction.  However, he developed AKI everytime we tried to clamp his PCN.  Will need to restudy him with a nephrostogram but would defer this until  after biopsy results. 3) Urothelial carcinoma:  I have communicated with Dr. Lanny.  Await results of LN biopsy.   LOS: 6 days   Noretta Ferrara 05/31/2024, 8:51 AM

## 2024-05-31 NOTE — Progress Notes (Signed)
 Mobility Specialist - Progress Note   05/31/24 1158  Mobility  Activity Ambulated with assistance in hallway  Level of Assistance Standby assist, set-up cues, supervision of patient - no hands on  Assistive Device Front wheel walker  Distance Ambulated (ft) 230 ft  Activity Response Tolerated well  Mobility Referral Yes  Mobility visit 1 Mobility  Mobility Specialist Start Time (ACUTE ONLY) 1142  Mobility Specialist Stop Time (ACUTE ONLY) 1157  Mobility Specialist Time Calculation (min) (ACUTE ONLY) 15 min   Pt received in bed and agreeable to mobility. Pt was minA from STS. No complaints during session. Pt to bed after session with all needs met.     [facility]

## 2024-05-31 NOTE — Progress Notes (Signed)
 [facility]  PROGRESS NOTE  Adam Harris DOB: 1946/04/16 DOA: 05/24/2024 PCP: [name], MD   Brief HPI:   78 y.o. male with medical history significant of atrial fibrillation on apixaban , anxiety, arthritis, CAD, NSTEMI, CHF with preserved EF, COVID-19, hematuria, urothelial cancer status post nephrostomy, acquired solitary kidney, stage IV CKD, prostate cancer, fatty liver, hyperlipidemia, hypertension, herpes zoster, thoracic aortic atherosclerosis, type 2 diabetes admitted with complaints of generalized weakness fatigue decreased appetite nausea and lower abdominal pain.  UA shows moderate leukocyte esterase nitrites Many bacteria greater than 50 WBC.  White count was 15.3 on admission.  COVID RSV and influenza were negative.  Creatinine 2.05 on admission.   CT head without contrast no evidence of acute intercranial abnormality.  CT abdomen/pelvis with contrast showing bladder wall thickening diffuse perivesical fat stranding and mild perinephric stranding about the mid and distal ureter.  Differential considerations include progression of bladder cancer versus infection.  New and increased retroperitoneal, iliac chain and perirectal lymph nodes when compared to MRI from 03/31/2023 suggesting metastatic disease.    Assessment/Plan:    Complicated UTI/pyelonephritis in the setting of single kidney and left percutaneous nephrostomy tube.   urothelial cancer status post nephrostomy,  Started on cefepime  2 g every 12.  Urology recommended to complete 7 days of treatment. -Completed cefepime  for 7 days Still with leukocytosis, culture shows multiple species CT of the abdomen concerning for progression of malignancy.   Discussed with Dr. Lanny will consulted IR to do a biopsy of the lymph node to rule out malignancy progression.  Since patient got aspirin  on the 14th IR would like to hold off on doing node biopsy for the next 3 days. - Underwent left iliac lymph node biopsy  today. -Result is currently pending     CKD (chronic kidney disease), stage IV (HCC) Creatinine 2.47 from 2.15 from 2.05 (hold Lasix , metolazone , ACE inhibitor, BP also running soft) -Creatinine has improved to 2.25 Monitor renal function and electrolytes. Continue sodium bicarbonate  650 mg po BID.   DC amlodipine  and metoprolol  due to soft BP.   Essential hypertension  -on 6 meds pta.  Hold amlodipine  and metoprolol  since BP running soft. hold Lasix ,imdur , metolazone  and irbesartan   - Blood pressure is soft today, will start LR at 100 mL/h   Chronic diastolic CHF  -patient appears dry hold diuretics and ACE    Permanent atrial fibrillation (HCC) CHA?DS?-VASc Score of at least 6. Not on anticoagulation. HOLD ASPIRIN  FOR BIOPSY OF LN Holding metoprolol  for soft blood pressure restart as appropriate   Hyperlipidemia Continue rosuvastatin  40 mg p.o. daily.   Type 2 diabetes mellitus with hyperglycemia (HCC) CBG (last 3)   Carbohydrate modified diet. CBG monitoring with RI SS. Hemoglobin A1c 8.3  -CBG well-controlled   Hyponatremia - Likely in setting of diuretic use - Diuretics on hold  Constipation -Resolved after giving 1 dose of soapsuds enema - No improvement with MiraLAX  and Senokot-S tablets  Medications     insulin  aspart  0-5 Units Subcutaneous QHS   insulin  aspart  0-6 Units Subcutaneous TID WC   mirtazapine   30 mg Oral QHS   rosuvastatin   40 mg Oral Daily   senna-docusate  1 tablet Oral BID   sodium bicarbonate   650 mg Oral BID   tamsulosin   0.4 mg Oral Daily     Data Reviewed:   CBG:  Recent Labs  Lab 05/29/24 1628 05/29/24 2232 05/30/24 0718 05/30/24 1640 05/31/24 0754  GLUCAP 256* 199* 180*  151* 140*    SpO2: 95 % O2 Flow Rate (L/min): 4 L/min    Vitals:   05/30/24 1245 05/30/24 1312 05/30/24 1417 05/31/24 0547  BP: 108/85 129/88 117/80 116/73  Pulse: (!) 105 (!) 102 (!) 103 (!) 103  Resp: 14  20 16   Temp:  98.1 F (36.7 C) 98.2  F (36.8 C) 98 F (36.7 C)  TempSrc:  Oral Oral Oral  SpO2: 94% 96% 100% 95%  Weight:      Height:          Data Reviewed:  Basic Metabolic Panel: Recent Labs  Lab 05/27/24 0553 05/28/24 0554 05/29/24 0603 05/30/24 0521 05/31/24 0627  NA 132* 130* 130* 130* 129*  K 3.9 3.4* 3.5 3.6 3.6  CL 97* 96* 95* 96* 96*  CO2 28 22 25 25 25   GLUCOSE 178* 167* 139* 193* 149*  BUN 44* 38* 34* 33* 32*  CREATININE 2.47* 2.32* 2.27* 2.36* 2.25*  CALCIUM  8.6* 8.4* 8.3* 8.4* 8.4*    CBC: Recent Labs  Lab 05/24/24 2215 05/26/24 0532 05/27/24 0553 05/28/24 0554 05/29/24 0603 05/30/24 0521 05/31/24 0627  WBC 15.3*   < > 19.1* 18.8* 17.0* 16.7* 17.4*  NEUTROABS 12.0*  --   --   --   --   --   --   HGB 10.8*   < > 11.6* 11.4* 11.4* 10.9* 11.3*  HCT 34.3*   < > 38.3* 38.4* 36.5* 36.2* 37.1*  MCV 75.2*   < > 76.6* 78.0* 75.9* 77.7* 75.7*  PLT 318   < > 263 249 243 219 254   < > = values in this interval not displayed.    LFT Recent Labs  Lab 05/27/24 0553 05/28/24 0554 05/29/24 0603 05/30/24 0521 05/31/24 0627  AST 15 22 17 20 26   ALT 13 15 12 14 16   ALKPHOS 117 123 125 124 142*  BILITOT 1.7* 1.5* 1.7* 1.4* 1.4*  PROT 7.5 7.0 7.1 6.8 7.0  ALBUMIN  2.5* 2.3* 2.1* 2.0* 2.1*     Antibiotics: Anti-infectives (From admission, onward)    Start     Dose/Rate Route Frequency Ordered Stop   05/28/24 0800  ceFEPIme  (MAXIPIME ) 2 g in sodium chloride  0.9 % 100 mL IVPB        2 g 200 mL/hr over 30 Minutes Intravenous Every 24 hours 05/27/24 0817 05/31/24 2359   05/25/24 0800  ceFEPIme  (MAXIPIME ) 2 g in sodium chloride  0.9 % 100 mL IVPB  Status:  Discontinued        2 g 200 mL/hr over 30 Minutes Intravenous Every 12 hours 05/25/24 0605 05/27/24 0817   05/25/24 0215  piperacillin -tazobactam (ZOSYN ) IVPB 3.375 g        3.375 g 100 mL/hr over 30 Minutes Intravenous  Once 05/25/24 0210 05/25/24 0248        DVT prophylaxis: SCDs  Code Status: Full code  Family Communication:  No family at bedside   CONSULTS    Subjective   Denies any complaints.   Objective    Physical Examination:  Appears in no acute distress S1-S2, regular, no murmur auscultated Lungs clear to auscultation bilaterally Abdomen is soft, nontender   Status is: Inpatient:         Adam Harris   [facility] If 7PM-7AM, please contact night-coverage at [email or web address], Office  [phone]   05/31/2024, 8:44 AM  LOS: 6 days

## 2024-05-31 NOTE — Plan of Care (Signed)
  Problem: Education: Goal: Knowledge of General Education information will improve Description: Including pain rating scale, medication(s)/side effects and non-pharmacologic comfort measures Outcome: Progressing   Problem: Health Behavior/Discharge Planning: Goal: Ability to manage health-related needs will improve Outcome: Progressing   Problem: Clinical Measurements: Goal: Ability to maintain clinical measurements within normal limits will improve Outcome: Progressing Goal: Will remain free from infection Outcome: Progressing Goal: Diagnostic test results will improve Outcome: Progressing Goal: Respiratory complications will improve Outcome: Progressing Goal: Cardiovascular complication will be avoided Outcome: Progressing   Problem: Activity: Goal: Risk for activity intolerance will decrease Outcome: Progressing   Problem: Nutrition: Goal: Adequate nutrition will be maintained Outcome: Progressing   Problem: Coping: Goal: Level of anxiety will decrease Outcome: Progressing   Problem: Elimination: Goal: Will not experience complications related to bowel motility Outcome: Progressing Goal: Will not experience complications related to urinary retention Outcome: Progressing   Problem: Elimination: Goal: Will not experience complications related to urinary retention Outcome: Progressing   Problem: Pain Managment: Goal: General experience of comfort will improve and/or be controlled Outcome: Progressing   Problem: Safety: Goal: Ability to remain free from injury will improve Outcome: Progressing   Problem: Skin Integrity: Goal: Risk for impaired skin integrity will decrease Outcome: Progressing   Problem: Education: Goal: Ability to describe self-care measures that may prevent or decrease complications (Diabetes Survival Skills Education) will improve Outcome: Progressing Goal: Individualized Educational Video(s) Outcome: Progressing   Problem: Fluid  Volume: Goal: Ability to maintain a balanced intake and output will improve Outcome: Progressing   Problem: Nutritional: Goal: Maintenance of adequate nutrition will improve Outcome: Progressing Goal: Progress toward achieving an optimal weight will improve Outcome: Progressing   Problem: Skin Integrity: Goal: Risk for impaired skin integrity will decrease Outcome: Progressing   Problem: Tissue Perfusion: Goal: Adequacy of tissue perfusion will improve Outcome: Progressing

## 2024-06-01 DIAGNOSIS — I10 Essential (primary) hypertension: Secondary | ICD-10-CM | POA: Diagnosis not present

## 2024-06-01 DIAGNOSIS — N390 Urinary tract infection, site not specified: Secondary | ICD-10-CM | POA: Diagnosis not present

## 2024-06-01 DIAGNOSIS — I5032 Chronic diastolic (congestive) heart failure: Secondary | ICD-10-CM | POA: Diagnosis not present

## 2024-06-01 DIAGNOSIS — R531 Weakness: Secondary | ICD-10-CM | POA: Diagnosis not present

## 2024-06-01 LAB — CBC
HCT: 35.6 % — ABNORMAL LOW (ref 39.0–52.0)
Hemoglobin: 10.9 g/dL — ABNORMAL LOW (ref 13.0–17.0)
MCH: 23.3 pg — ABNORMAL LOW (ref 26.0–34.0)
MCHC: 30.6 g/dL (ref 30.0–36.0)
MCV: 76.1 fL — ABNORMAL LOW (ref 80.0–100.0)
Platelets: 266 K/uL (ref 150–400)
RBC: 4.68 MIL/uL (ref 4.22–5.81)
RDW: 19.4 % — ABNORMAL HIGH (ref 11.5–15.5)
WBC: 15.1 K/uL — ABNORMAL HIGH (ref 4.0–10.5)
nRBC: 0 % (ref 0.0–0.2)

## 2024-06-01 LAB — GLUCOSE, CAPILLARY
Glucose-Capillary: 140 mg/dL — ABNORMAL HIGH (ref 70–99)
Glucose-Capillary: 179 mg/dL — ABNORMAL HIGH (ref 70–99)
Glucose-Capillary: 181 mg/dL — ABNORMAL HIGH (ref 70–99)
Glucose-Capillary: 201 mg/dL — ABNORMAL HIGH (ref 70–99)

## 2024-06-01 LAB — BASIC METABOLIC PANEL WITH GFR
Anion gap: 9 (ref 5–15)
BUN: 29 mg/dL — ABNORMAL HIGH (ref 8–23)
CO2: 25 mmol/L (ref 22–32)
Calcium: 8.5 mg/dL — ABNORMAL LOW (ref 8.9–10.3)
Chloride: 99 mmol/L (ref 98–111)
Creatinine, Ser: 2.3 mg/dL — ABNORMAL HIGH (ref 0.61–1.24)
GFR, Estimated: 29 mL/min — ABNORMAL LOW (ref >60)
Glucose, Bld: 129 mg/dL — ABNORMAL HIGH (ref 70–99)
Potassium: 3.9 mmol/L (ref 3.5–5.1)
Sodium: 133 mmol/L — ABNORMAL LOW (ref 135–145)

## 2024-06-01 NOTE — Progress Notes (Signed)
 [facility]  PROGRESS NOTE  Adam Harris FMW:[id number] DOB: 23-Sep-1946 DOA: 05/24/2024 PCP: [name], MD   Brief HPI:   78 y.o. male with medical history significant of atrial fibrillation on apixaban , anxiety, arthritis, CAD, NSTEMI, CHF with preserved EF, COVID-19, hematuria, urothelial cancer status post nephrostomy, acquired solitary kidney, stage IV CKD, prostate cancer, fatty liver, hyperlipidemia, hypertension, herpes zoster, thoracic aortic atherosclerosis, type 2 diabetes admitted with complaints of generalized weakness fatigue decreased appetite nausea and lower abdominal pain.  UA shows moderate leukocyte esterase nitrites Many bacteria greater than 50 WBC.  White count was 15.3 on admission.  COVID RSV and influenza were negative.  Creatinine 2.05 on admission.   CT head without contrast no evidence of acute intercranial abnormality.  CT abdomen/pelvis with contrast showing bladder wall thickening diffuse perivesical fat stranding and mild perinephric stranding about the mid and distal ureter.  Differential considerations include progression of bladder cancer versus infection.  New and increased retroperitoneal, iliac chain and perirectal lymph nodes when compared to MRI from 03/31/2023 suggesting metastatic disease.    Assessment/Plan:    Complicated UTI/pyelonephritis in the setting of single kidney and left percutaneous nephrostomy tube.   urothelial cancer status post nephrostomy,  Started on cefepime  2 g every 12.  Urology recommended to complete 7 days of treatment. -Completed cefepime  for 7 days Still with leukocytosis, culture shows multiple species CT of the abdomen concerning for progression of malignancy.   Discussed with Dr. Lanny will consulted IR to do a biopsy of the lymph node to rule out malignancy progression.  Since patient got aspirin  on the 14th IR would like to hold off on doing node biopsy for the next 3 days. - Underwent left iliac lymph node biopsy  on 7/18 -Result is currently pending     CKD (chronic kidney disease), stage IV (HCC) Creatinine 2.47 from 2.15 from 2.05 (hold Lasix , metolazone , ACE inhibitor, BP also running soft) -Creatinine has improved to 2.30 Monitor renal function and electrolytes. Continue sodium bicarbonate  650 mg po BID.   DC amlodipine  and metoprolol  due to soft BP.   Essential hypertension  -on 6 meds pta.  Hold amlodipine  and metoprolol  since BP running soft. hold Lasix ,imdur , metolazone  and irbesartan      Chronic diastolic CHF  -patient appears dry, hold diuretics and ACE    Permanent atrial fibrillation (HCC) CHA?DS?-VASc Score of at least 6. Not on anticoagulation. HOLD ASPIRIN  FOR BIOPSY OF LN Holding metoprolol  for soft blood pressure restart as appropriate   Hyperlipidemia Continue rosuvastatin  40 mg p.o. daily.   Type 2 diabetes mellitus with hyperglycemia (HCC) CBG (last 3)   Carbohydrate modified diet. CBG monitoring with RI SS. Hemoglobin A1c 8.3  -CBG well-controlled   Hyponatremia - Likely in setting of diuretic use - Diuretics on hold  Constipation -Resolved after giving 1 dose of soapsuds enema - No improvement with MiraLAX  and Senokot-S tablets  Medications     insulin  aspart  0-5 Units Subcutaneous QHS   insulin  aspart  0-6 Units Subcutaneous TID WC   mirtazapine   30 mg Oral QHS   rosuvastatin   40 mg Oral Daily   senna-docusate  1 tablet Oral BID   sodium bicarbonate   650 mg Oral BID   tamsulosin   0.4 mg Oral Daily     Data Reviewed:   CBG:  Recent Labs  Lab 05/30/24 1640 05/31/24 0754 05/31/24 1552 05/31/24 2116 06/01/24 0743  GLUCAP 151* 140* 275* 213* 140*    SpO2: 92 % O2 Flow  Rate (L/min): 4 L/min    Vitals:   05/31/24 0547 05/31/24 1300 05/31/24 2115 06/01/24 0556  BP: 116/73 124/84 104/71 115/74  Pulse: (!) 103 98 (!) 104 75  Resp: 16 18 18 18   Temp: 98 F (36.7 C) 98.2 F (36.8 C) 98 F (36.7 C) (!) 97.3 F (36.3 C)  TempSrc:  Oral Oral Oral Oral  SpO2: 95% 95% 96% 92%  Weight:      Height:          Data Reviewed:  Basic Metabolic Panel: Recent Labs  Lab 05/28/24 0554 05/29/24 0603 05/30/24 0521 05/31/24 0627 06/01/24 0558  NA 130* 130* 130* 129* 133*  K 3.4* 3.5 3.6 3.6 3.9  CL 96* 95* 96* 96* 99  CO2 22 25 25 25 25   GLUCOSE 167* 139* 193* 149* 129*  BUN 38* 34* 33* 32* 29*  CREATININE 2.32* 2.27* 2.36* 2.25* 2.30*  CALCIUM  8.4* 8.3* 8.4* 8.4* 8.5*    CBC: Recent Labs  Lab 05/28/24 0554 05/29/24 0603 05/30/24 0521 05/31/24 0627 06/01/24 0558  WBC 18.8* 17.0* 16.7* 17.4* 15.1*  HGB 11.4* 11.4* 10.9* 11.3* 10.9*  HCT 38.4* 36.5* 36.2* 37.1* 35.6*  MCV 78.0* 75.9* 77.7* 75.7* 76.1*  PLT 249 243 219 254 266    LFT Recent Labs  Lab 05/27/24 0553 05/28/24 0554 05/29/24 0603 05/30/24 0521 05/31/24 0627  AST 15 22 17 20 26   ALT 13 15 12 14 16   ALKPHOS 117 123 125 124 142*  BILITOT 1.7* 1.5* 1.7* 1.4* 1.4*  PROT 7.5 7.0 7.1 6.8 7.0  ALBUMIN  2.5* 2.3* 2.1* 2.0* 2.1*     Antibiotics: Anti-infectives (From admission, onward)    Start     Dose/Rate Route Frequency Ordered Stop   05/28/24 0800  ceFEPIme  (MAXIPIME ) 2 g in sodium chloride  0.9 % 100 mL IVPB        2 g 200 mL/hr over 30 Minutes Intravenous Every 24 hours 05/27/24 0817 05/31/24 0911   05/25/24 0800  ceFEPIme  (MAXIPIME ) 2 g in sodium chloride  0.9 % 100 mL IVPB  Status:  Discontinued        2 g 200 mL/hr over 30 Minutes Intravenous Every 12 hours 05/25/24 0605 05/27/24 0817   05/25/24 0215  piperacillin -tazobactam (ZOSYN ) IVPB 3.375 g        3.375 g 100 mL/hr over 30 Minutes Intravenous  Once 05/25/24 0210 05/25/24 0248        DVT prophylaxis: SCDs  Code Status: Full code  Family Communication: No family at bedside   CONSULTS    Subjective   Denies any complaints   Objective    Physical Examination:  General-appears in no acute distress Heart-S1-S2, regular, no murmur auscultated Lungs-clear to  auscultation bilaterally, no wheezing or crackles auscultated Abdomen-soft, nontender, no organomegaly Extremities-no edema in the lower extremities Neuro-alert, oriented x3, no focal deficit noted   Status is: Inpatient:         Sabas GORMAN Brod   [facility] If 7PM-7AM, please contact night-coverage at [email or web address], Office  [phone]   06/01/2024, 8:31 AM  LOS: 7 days

## 2024-06-01 NOTE — Progress Notes (Signed)
 Mobility Specialist - Progress Note   06/01/24 1334  Mobility  Activity Ambulated with assistance in hallway  Level of Assistance Standby assist, set-up cues, supervision of patient - no hands on  Assistive Device Front wheel walker  Distance Ambulated (ft) 300 ft  Activity Response Tolerated well  Mobility Referral Yes  Mobility visit 1 Mobility  Mobility Specialist Start Time (ACUTE ONLY) 1311  Mobility Specialist Stop Time (ACUTE ONLY) 1329  Mobility Specialist Time Calculation (min) (ACUTE ONLY) 18 min   Pt received in bed and agreeable to mobility. No complaints during session. Pt to bed after session with all needs met. Bed alarm on.   [facility]

## 2024-06-01 NOTE — Plan of Care (Signed)

## 2024-06-02 DIAGNOSIS — I5032 Chronic diastolic (congestive) heart failure: Secondary | ICD-10-CM | POA: Diagnosis not present

## 2024-06-02 DIAGNOSIS — I10 Essential (primary) hypertension: Secondary | ICD-10-CM | POA: Diagnosis not present

## 2024-06-02 DIAGNOSIS — E1165 Type 2 diabetes mellitus with hyperglycemia: Secondary | ICD-10-CM

## 2024-06-02 DIAGNOSIS — E782 Mixed hyperlipidemia: Secondary | ICD-10-CM

## 2024-06-02 DIAGNOSIS — N390 Urinary tract infection, site not specified: Secondary | ICD-10-CM | POA: Diagnosis not present

## 2024-06-02 DIAGNOSIS — N184 Chronic kidney disease, stage 4 (severe): Secondary | ICD-10-CM | POA: Diagnosis not present

## 2024-06-02 LAB — BASIC METABOLIC PANEL WITH GFR
Anion gap: 13 (ref 5–15)
BUN: 31 mg/dL — ABNORMAL HIGH (ref 8–23)
CO2: 22 mmol/L (ref 22–32)
Calcium: 8.8 mg/dL — ABNORMAL LOW (ref 8.9–10.3)
Chloride: 99 mmol/L (ref 98–111)
Creatinine, Ser: 2.34 mg/dL — ABNORMAL HIGH (ref 0.61–1.24)
GFR, Estimated: 28 mL/min — ABNORMAL LOW (ref >60)
Glucose, Bld: 234 mg/dL — ABNORMAL HIGH (ref 70–99)
Potassium: 4.3 mmol/L (ref 3.5–5.1)
Sodium: 134 mmol/L — ABNORMAL LOW (ref 135–145)

## 2024-06-02 LAB — GLUCOSE, CAPILLARY
Glucose-Capillary: 222 mg/dL — ABNORMAL HIGH (ref 70–99)
Glucose-Capillary: 131 mg/dL — ABNORMAL HIGH (ref 70–99)
Glucose-Capillary: 129 mg/dL — ABNORMAL HIGH (ref 70–99)
Glucose-Capillary: 122 mg/dL — ABNORMAL HIGH (ref 70–99)
Glucose-Capillary: 226 mg/dL — ABNORMAL HIGH (ref 70–99)
Glucose-Capillary: 151 mg/dL — ABNORMAL HIGH (ref 70–99)

## 2024-06-02 LAB — CBC WITH DIFFERENTIAL/PLATELET
Abs Immature Granulocytes: 0.08 K/uL — ABNORMAL HIGH (ref 0.00–0.07)
Basophils Absolute: 0.2 K/uL — ABNORMAL HIGH (ref 0.0–0.1)
Basophils Relative: 1 %
Eosinophils Absolute: 0.1 K/uL (ref 0.0–0.5)
Eosinophils Relative: 1 %
HCT: 40 % (ref 39.0–52.0)
Hemoglobin: 12 g/dL — ABNORMAL LOW (ref 13.0–17.0)
Immature Granulocytes: 1 %
Lymphocytes Relative: 6 %
Lymphs Abs: 1 K/uL (ref 0.7–4.0)
MCH: 23 pg — ABNORMAL LOW (ref 26.0–34.0)
MCHC: 30 g/dL (ref 30.0–36.0)
MCV: 76.6 fL — ABNORMAL LOW (ref 80.0–100.0)
Monocytes Absolute: 1.1 K/uL — ABNORMAL HIGH (ref 0.1–1.0)
Monocytes Relative: 8 %
Neutro Abs: 12.6 K/uL — ABNORMAL HIGH (ref 1.7–7.7)
Neutrophils Relative %: 83 %
Platelets: 309 K/uL (ref 150–400)
RBC: 5.22 MIL/uL (ref 4.22–5.81)
RDW: 19.7 % — ABNORMAL HIGH (ref 11.5–15.5)
WBC: 15 K/uL — ABNORMAL HIGH (ref 4.0–10.5)
nRBC: 0 % (ref 0.0–0.2)

## 2024-06-02 MED ORDER — MIRTAZAPINE 15 MG PO TABS: 7.5000 mg | ORAL_TABLET | Freq: Every day | ORAL | Status: DC

## 2024-06-02 MED ORDER — LACTATED RINGERS IV BOLUS
1000.0000 mL | Freq: Once | INTRAVENOUS | Status: AC
  Administered 2024-06-02: 1000 mL via INTRAVENOUS

## 2024-06-02 NOTE — Progress Notes (Signed)
 Mobility Specialist - Progress Note   06/02/24 1217  Mobility  Activity Ambulated with assistance in hallway  Level of Assistance Standby assist, set-up cues, supervision of patient - no hands on  Assistive Device Front wheel walker  Distance Ambulated (ft) 250 ft  Activity Response Tolerated well  Mobility Referral Yes  Mobility visit 1 Mobility  Mobility Specialist Start Time (ACUTE ONLY) 1201  Mobility Specialist Stop Time (ACUTE ONLY) 1205  Mobility Specialist Time Calculation (min) (ACUTE ONLY) 4 min   Pt received in bed and agreeable to mobility. No complaints during session. Pt to bed after session with all needs met. Bed alarm on.    [facility]

## 2024-06-02 NOTE — Progress Notes (Addendum)
 Adam Harris   DOB:1946/11/02   FM#:[id number]   RDW#:[id number]  Medical oncology follow up   Subjective: Patient underwent left iliac node biopsy by IR last Friday.  He reports significant pelvic abdominal pain, intermittent, and has required oral oxycodone  and IV Dilaudid , appetite is low, fatigued, he is able to walk with a walker in the hallway.   Objective:  Vitals:   06/02/24 1320 06/02/24 1616  BP: (!) 94/56 121/72  Pulse: 68 90  Resp: 16   Temp: 98.9 F (37.2 C)   SpO2: 95%     Body mass index is 27.55 kg/m.  Intake/Output Summary (Last 24 hours) at 06/02/2024 1733 Last data filed at 06/02/2024 1553 Gross per 24 hour  Intake 1215.72 ml  Output 1100 ml  Net 115.72 ml     Sclerae unicteric  Oropharynx clear  Abdomen soft  MSK no focal spinal tenderness, no peripheral edema  Neuro nonfocal    CBG (last 3)  Recent Labs    06/02/24 0703 06/02/24 1146 06/02/24 1630  GLUCAP 129* 122* 226*     Labs:  Lab Results  Component Value Date   WBC 15.0 (H) 06/02/2024   HGB 12.0 (L) 06/02/2024   HCT 40.0 06/02/2024   MCV 76.6 (L) 06/02/2024   PLT 309 06/02/2024   NEUTROABS 12.6 (H) 06/02/2024     Urine Studies No results for input(s): UHGB, CRYS in the last 72 hours.  Invalid input(s): UACOL, UAPR, USPG, UPH, UTP, UGL, UKET, UBIL, UNIT, UROB, ULEU, UEPI, UWBC, [place], [place], [place], [place], [place]  Basic Metabolic Panel: Recent Labs  Lab 05/29/24 0603 05/30/24 0521 05/31/24 0627 06/01/24 0558 06/02/24 1301  NA 130* 130* 129* 133* 134*  K 3.5 3.6 3.6 3.9 4.3  CL 95* 96* 96* 99 99  CO2 25 25 25 25 22   GLUCOSE 139* 193* 149* 129* 234*  BUN 34* 33* 32* 29* 31*  CREATININE 2.27* 2.36* 2.25* 2.30* 2.34*  CALCIUM  8.3* 8.4* 8.4* 8.5* 8.8*   GFR Estimated Creatinine Clearance: 28.2 mL/min (A) (by C-G formula based on SCr of 2.34 mg/dL (H)). Liver Function Tests: Recent Labs  Lab 05/27/24 0553 05/28/24 0554 05/29/24 0603  05/30/24 0521 05/31/24 0627  AST 15 22 17 20 26   ALT 13 15 12 14 16   ALKPHOS 117 123 125 124 142*  BILITOT 1.7* 1.5* 1.7* 1.4* 1.4*  PROT 7.5 7.0 7.1 6.8 7.0  ALBUMIN  2.5* 2.3* 2.1* 2.0* 2.1*   No results for input(s): LIPASE, AMYLASE in the last 168 hours. No results for input(s): AMMONIA in the last 168 hours. Coagulation profile Recent Labs  Lab 05/30/24 0521  INR 1.5*    CBC: Recent Labs  Lab 05/29/24 0603 05/30/24 0521 05/31/24 0627 06/01/24 0558 06/02/24 1301  WBC 17.0* 16.7* 17.4* 15.1* 15.0*  NEUTROABS  --   --   --   --  12.6*  HGB 11.4* 10.9* 11.3* 10.9* 12.0*  HCT 36.5* 36.2* 37.1* 35.6* 40.0  MCV 75.9* 77.7* 75.7* 76.1* 76.6*  PLT 243 219 254 266 309   Cardiac Enzymes: No results for input(s): CKTOTAL, CKMB, CKMBINDEX, TROPONINI in the last 168 hours. BNP: Invalid input(s): POCBNP CBG: Recent Labs  Lab 06/01/24 1557 06/01/24 2108 06/02/24 0703 06/02/24 1146 06/02/24 1630  GLUCAP 181* 201* 129* 122* 226*   D-Dimer No results for input(s): DDIMER in the last 72 hours. Hgb A1c No results for input(s): HGBA1C in the last 72 hours. Lipid Profile No results for input(s): CHOL, HDL, LDLCALC, TRIG, CHOLHDL,  LDLDIRECT in the last 72 hours. Thyroid  function studies No results for input(s): TSH, T4TOTAL, T3FREE, THYROIDAB in the last 72 hours.  Invalid input(s): FREET3 Anemia work up No results for input(s): VITAMINB12, FOLATE, FERRITIN, TIBC, IRON, RETICCTPCT in the last 72 hours. Microbiology Recent Results (from the past 240 hours)  Resp panel by RT-PCR (RSV, Flu A&B, Covid) Anterior Nasal Swab     Status: None   Collection Time: 05/24/24 10:16 PM   Specimen: Anterior Nasal Swab  Result Value Ref Range Status   SARS Coronavirus 2 by RT PCR NEGATIVE NEGATIVE Final    Comment: (NOTE) SARS-CoV-2 target nucleic acids are NOT DETECTED.  The SARS-CoV-2 RNA is generally detectable in upper  respiratory specimens during the acute phase of infection. The lowest concentration of SARS-CoV-2 viral copies this assay can detect is 138 copies/mL. A negative result does not preclude SARS-Cov-2 infection and should not be used as the sole basis for treatment or other patient management decisions. A negative result may occur with  improper specimen collection/handling, submission of specimen other than nasopharyngeal swab, presence of viral mutation(s) within the areas targeted by this assay, and inadequate number of viral copies(<138 copies/mL). A negative result must be combined with clinical observations, patient history, and epidemiological information. The expected result is Negative.  Fact Sheet for Patients:  BloggerCourse.com  Fact Sheet for Healthcare Providers:  SeriousBroker.it  This test is no t yet approved or cleared by the [place]  FDA and  has been authorized for detection and/or diagnosis of SARS-CoV-2 by FDA under an Emergency Use Authorization (EUA). This EUA will remain  in effect (meaning this test can be used) for the duration of the COVID-19 declaration under Section 564(b)(1) of the Act, 21 U.S.C.section 360bbb-3(b)(1), unless the authorization is terminated  or revoked sooner.       Influenza A by PCR NEGATIVE NEGATIVE Final   Influenza B by PCR NEGATIVE NEGATIVE Final    Comment: (NOTE) The Xpert Xpress SARS-CoV-2/FLU/RSV plus assay is intended as an aid in the diagnosis of influenza from Nasopharyngeal swab specimens and should not be used as a sole basis for treatment. Nasal washings and aspirates are unacceptable for Xpert Xpress SARS-CoV-2/FLU/RSV testing.  Fact Sheet for Patients: BloggerCourse.com  Fact Sheet for Healthcare Providers: SeriousBroker.it  This test is not yet approved or cleared by the [place]  FDA and has been  authorized for detection and/or diagnosis of SARS-CoV-2 by FDA under an Emergency Use Authorization (EUA). This EUA will remain in effect (meaning this test can be used) for the duration of the COVID-19 declaration under Section 564(b)(1) of the Act, 21 U.S.C. section 360bbb-3(b)(1), unless the authorization is terminated or revoked.     Resp Syncytial Virus by PCR NEGATIVE NEGATIVE Final    Comment: (NOTE) Fact Sheet for Patients: BloggerCourse.com  Fact Sheet for Healthcare Providers: SeriousBroker.it  This test is not yet approved or cleared by the [place]  FDA and has been authorized for detection and/or diagnosis of SARS-CoV-2 by FDA under an Emergency Use Authorization (EUA). This EUA will remain in effect (meaning this test can be used) for the duration of the COVID-19 declaration under Section 564(b)(1) of the Act, 21 U.S.C. section 360bbb-3(b)(1), unless the authorization is terminated or revoked.  Performed at [facility], [address] [place], [place] [address]   Culture, blood (routine x 2)     Status: None   Collection Time: 05/25/24 12:36 AM   Specimen: BLOOD  Result Value Ref  Range Status   Specimen Description   Final    BLOOD RIGHT ANTECUBITAL Performed at [facility], [address] [place], [place] [address]    Special Requests   Final    BOTTLES DRAWN AEROBIC AND ANAEROBIC Blood Culture adequate volume Performed at [facility], [address] [place], [place] [address]    Culture   Final    NO GROWTH 5 DAYS Performed at [facility], [address], [place], [place] [address]    Report Status 05/30/2024 FINAL  Final  Urine Culture     Status: Abnormal   Collection Time: 05/25/24  2:14 AM   Specimen: Urine, Random  Result Value Ref Range Status   Specimen Description   Final    URINE, RANDOM Performed at [facility], [address] [place], [place] [address]    Special Requests   Final    NONE Reflexed from [id number] Performed at [facility], [address] [place], [place] [address]    Culture MULTIPLE SPECIES PRESENT, SUGGEST RECOLLECTION (A)  Final   Report Status 05/26/2024 FINAL  Final      Studies:  No results found.   Assessment: 78 y.o. 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 Frutoso today.  The pelvic lymph node biopsy from last Friday did not show malignant cells, IHC studies were ordered to help the diagnosis of the primary tumor.  Given the history of bladder cancer and the morphology, Dr. Frutoso feels this is likely metastatic bladder cancer. - I spoke with the patient and his wife on the phone about the above. - Will obtain PET scan for staging and a better evaluate the extent of disease - I will talk to Dr. Renda, I suspect upfront surgery is unlikely -if surgery is planned and he has no other distant metastasis, I will recommend neoadjuvant chemotherapy cisplatin and gemcitabine. - If surgery is not planned, I recommend first-line systemic treatment with Enfortumab and pembrolizumab .  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 -Per patient's request, we discussed the overall prognosis  - I encouraged him to use oral oxycodone  for his pain control, which is likely related to the nodal metastasis. - Discharge per primary team, no additional oncology workup is planned. - I will see him back in the office after PET scan. - I spent a total  of 50 minutes for his visit today.  I spoke with his wife on the phone and answered all her questions.  Onita Mattock  06/02/2024

## 2024-06-02 NOTE — Plan of Care (Signed)
  Problem: Education: Goal: Knowledge of General Education information will improve Description: Including pain rating scale, medication(s)/side effects and non-pharmacologic comfort measures Outcome: Progressing   Problem: Health Behavior/Discharge Planning: Goal: Ability to manage health-related needs will improve Outcome: Progressing   Problem: Clinical Measurements: Goal: Ability to maintain clinical measurements within normal limits will improve Outcome: Progressing Goal: Will remain free from infection Outcome: Progressing Goal: Diagnostic test results will improve Outcome: Progressing Goal: Respiratory complications will improve Outcome: Progressing Goal: Cardiovascular complication will be avoided Outcome: Progressing   Problem: Activity: Goal: Risk for activity intolerance will decrease Outcome: Progressing   Problem: Nutrition: Goal: Adequate nutrition will be maintained Outcome: Progressing   Problem: Coping: Goal: Level of anxiety will decrease Outcome: Progressing   Problem: Elimination: Goal: Will not experience complications related to bowel motility Outcome: Progressing Goal: Will not experience complications related to urinary retention Outcome: Progressing   Problem: Pain Managment: Goal: General experience of comfort will improve and/or be controlled Outcome: Progressing   Problem: Safety: Goal: Ability to remain free from injury will improve Outcome: Progressing   Problem: Skin Integrity: Goal: Risk for impaired skin integrity will decrease Outcome: Progressing   Problem: Education: Goal: Ability to describe self-care measures that may prevent or decrease complications (Diabetes Survival Skills Education) will improve Outcome: Progressing Goal: Individualized Educational Video(s) Outcome: Progressing   Problem: Coping: Goal: Ability to adjust to condition or change in health will improve Outcome: Progressing   Problem: Fluid  Volume: Goal: Ability to maintain a balanced intake and output will improve Outcome: Progressing   Problem: Health Behavior/Discharge Planning: Goal: Ability to identify and utilize available resources and services will improve Outcome: Progressing Goal: Ability to manage health-related needs will improve Outcome: Progressing   Problem: Metabolic: Goal: Ability to maintain appropriate glucose levels will improve Outcome: Progressing   Problem: Nutritional: Goal: Maintenance of adequate nutrition will improve Outcome: Progressing Goal: Progress toward achieving an optimal weight will improve Outcome: Progressing  Problem: Tissue Perfusion: Goal: Adequacy of tissue perfusion will improve Outcome: Progressing

## 2024-06-02 NOTE — Progress Notes (Signed)
 [facility]                                                                               Adam Harris, is a 78 y.o. male, DOB - 09/06/1946, FMW:[id number] Admit date - 05/24/2024    Outpatient Primary MD for the patient is Rudd, Garnette HERO, MD  LOS - 8  days    Brief summary   78 y.o. male with medical history significant of atrial fibrillation on apixaban , anxiety, arthritis, CAD, NSTEMI, CHF with preserved EF, COVID-19, hematuria, urothelial cancer status post nephrostomy, acquired solitary kidney, stage IV CKD, prostate cancer, fatty liver, hyperlipidemia, hypertension, herpes zoster, thoracic aortic atherosclerosis, type 2 diabetes admitted with complaints of generalized weakness fatigue decreased appetite nausea and lower abdominal pain.  UA shows moderate leukocyte esterase nitrites Many bacteria greater than 50 WBC.  White count was 15.3 on admission.  COVID RSV and influenza were negative.  Creatinine 2.05 on admission.   CT head without contrast no evidence of acute intercranial abnormality.  CT abdomen/pelvis with contrast showing bladder wall thickening diffuse perivesical fat stranding and mild perinephric stranding about the mid and distal ureter.  Differential considerations include progression of bladder cancer versus infection.  New and increased retroperitoneal, iliac chain and perirectal lymph nodes when compared to MRI from 03/31/2023 suggesting metastatic disease.   Assessment & Plan    Assessment and Plan:  Complicated UTI/ pyelonephritis in the setting of solitary kidney and left nephrostomy tube.  Urothelial cancer s/p  nephrostomy.  Completed 7 days of IV antibiotics ( cefepime ) Left iliac LN biopsy , done and pending. Pt slightly hypotensive today. Holding all BP meds.    Hypertension: BP soft today. 1 lit of fluid bolus ordered.  Holding norvasc , metoprolol , lasix , imdur  and irbesartan .    Chronic diastolic CHF.  Stable.     Permanent afib.   Rate controlled. Not on anti coagulation.    Hyperlipidemia Resume statin.   Constipation Resolved.    Hyponatremia Mild at 134.   CKD (chronic kidney disease), stage IV (HCC) Creatinine 2.47 from 2.15 from 2.05 (hold Lasix , metolazone , ACE inhibitor, BP also running soft) -Creatinine has improved to 2.30 Monitor renal function and electrolytes. Continue sodium bicarbonate  650 mg po BID.   Type2 DM:  CBG (last 3)  Recent Labs    06/02/24 0703 06/02/24 1146 06/02/24 1630  GLUCAP 129* 122* 226*   Resume SSI.     Code Status: full code.  DVT Prophylaxis:  SCDs Start: 05/25/24 0818   Level of Care: Level of care: Telemetry Family Communication: none at bedside.   Disposition Plan:     Remains inpatient appropriate:  pending normalization of BP parameters.   Procedures:  LN biopsy.   Consultants:   Urology Oncology.   Antimicrobials:   Anti-infectives (From admission, onward)    Start     Dose/Rate Route Frequency Ordered Stop   05/28/24 0800  ceFEPIme  (MAXIPIME ) 2 g in sodium chloride  0.9 % 100 mL IVPB        2 g 200 mL/hr over 30 Minutes Intravenous Every 24 hours 05/27/24 0817 05/31/24 0911   05/25/24 0800  ceFEPIme  (MAXIPIME ) 2 g in  sodium chloride  0.9 % 100 mL IVPB  Status:  Discontinued        2 g 200 mL/hr over 30 Minutes Intravenous Every 12 hours 05/25/24 0605 05/27/24 0817   05/25/24 0215  piperacillin -tazobactam (ZOSYN ) IVPB 3.375 g        3.375 g 100 mL/hr over 30 Minutes Intravenous  Once 05/25/24 0210 05/25/24 0248        Medications  Scheduled Meds:  insulin  aspart  0-5 Units Subcutaneous QHS   insulin  aspart  0-6 Units Subcutaneous TID WC   mirtazapine   30 mg Oral QHS   mirtazapine   7.5 mg Oral QHS   rosuvastatin   40 mg Oral Daily   senna-docusate  1 tablet Oral BID   sodium bicarbonate   650 mg Oral BID   tamsulosin   0.4 mg Oral Daily   Continuous Infusions: PRN Meds:.acetaminophen  **OR** acetaminophen , HYDROmorphone   (DILAUDID ) injection, hydrOXYzine , ondansetron  **OR** ondansetron  (ZOFRAN ) IV, oxyCODONE , polyethylene glycol    Subjective:   Adam Harris was seen and examined today.    Objective:   Vitals:   06/01/24 1921 06/02/24 0419 06/02/24 1320 06/02/24 1616  BP: 120/74 118/81 (!) 94/56 121/72  Pulse: 94 87 68 90  Resp: 18 18 16    Temp: 97.7 F (36.5 C) 97.9 F (36.6 C) 98.9 F (37.2 C)   TempSrc: Oral Oral Oral   SpO2: 95% 94% 95%   Weight:      Height:        Intake/Output Summary (Last 24 hours) at 06/02/2024 1712 Last data filed at 06/02/2024 1553 Gross per 24 hour  Intake 1215.72 ml  Output 1100 ml  Net 115.72 ml   Filed Weights   05/24/24 2212 05/25/24 0602  Weight: 89.4 kg 89.6 kg     Exam General exam: Appears calm and comfortable  Respiratory system: Clear to auscultation. Respiratory effort normal. Cardiovascular system: S1 & S2 heard, RRR. No JVD, Gastrointestinal system: Abdomen is nondistended, soft and nontender.  Central nervous system: Alert and oriented.  Extremities: Symmetric 5 x 5 power. Skin: No rashes,  Psychiatry: Mood & affect appropriate.    Data Reviewed:  I have personally reviewed following labs and imaging studies   CBC Lab Results  Component Value Date   WBC 15.0 (H) 06/02/2024   RBC 5.22 06/02/2024   HGB 12.0 (L) 06/02/2024   HCT 40.0 06/02/2024   MCV 76.6 (L) 06/02/2024   MCH 23.0 (L) 06/02/2024   PLT 309 06/02/2024   MCHC 30.0 06/02/2024   RDW 19.7 (H) 06/02/2024   LYMPHSABS 1.0 06/02/2024   MONOABS 1.1 (H) 06/02/2024   EOSABS 0.1 06/02/2024   BASOSABS 0.2 (H) 06/02/2024     Last metabolic panel Lab Results  Component Value Date   NA 134 (L) 06/02/2024   K 4.3 06/02/2024   CL 99 06/02/2024   CO2 22 06/02/2024   BUN 31 (H) 06/02/2024   CREATININE 2.34 (H) 06/02/2024   GLUCOSE 234 (H) 06/02/2024   GFRNONAA 28 (L) 06/02/2024   GFRAA 32 (L) 08/05/2020   CALCIUM  8.8 (L) 06/02/2024   PHOS 4.2 08/06/2023   PROT 7.0  05/31/2024   ALBUMIN  2.1 (L) 05/31/2024   BILITOT 1.4 (H) 05/31/2024   ALKPHOS 142 (H) 05/31/2024   AST 26 05/31/2024   ALT 16 05/31/2024   ANIONGAP 13 06/02/2024    CBG (last 3)  Recent Labs    06/02/24 0703 06/02/24 1146 06/02/24 1630  GLUCAP 129* 122* 226*      Coagulation Profile:  Recent Labs  Lab 05/30/24 0521  INR 1.5*     Radiology Studies: No results found.     Elgie Butter M.D. [facility] 06/02/2024, 5:12 PM  Available via Epic secure chat 7am-7pm After 7 pm, please refer to night coverage provider listed on amion.

## 2024-06-03 DIAGNOSIS — I5032 Chronic diastolic (congestive) heart failure: Secondary | ICD-10-CM | POA: Diagnosis not present

## 2024-06-03 DIAGNOSIS — N184 Chronic kidney disease, stage 4 (severe): Secondary | ICD-10-CM | POA: Diagnosis not present

## 2024-06-03 DIAGNOSIS — N390 Urinary tract infection, site not specified: Secondary | ICD-10-CM | POA: Diagnosis not present

## 2024-06-03 DIAGNOSIS — I10 Essential (primary) hypertension: Secondary | ICD-10-CM | POA: Diagnosis not present

## 2024-06-03 LAB — GLUCOSE, CAPILLARY
Glucose-Capillary: 162 mg/dL — ABNORMAL HIGH (ref 70–99)
Glucose-Capillary: 178 mg/dL — ABNORMAL HIGH (ref 70–99)

## 2024-06-03 LAB — SURGICAL PATHOLOGY

## 2024-06-03 MED ORDER — SENNOSIDES-DOCUSATE SODIUM 8.6-50 MG PO TABS: 1.0000 | ORAL_TABLET | Freq: Two times a day (BID) | ORAL | Status: DC

## 2024-06-03 MED ORDER — MIRTAZAPINE 30 MG PO TBDP: 30.0000 mg | ORAL_TABLET | Freq: Every day | ORAL | 0 refills | Status: DC

## 2024-06-03 MED ORDER — ISOSORBIDE MONONITRATE ER 30 MG PO TB24: 30.0000 mg | ORAL_TABLET | Freq: Every day | ORAL | Status: DC

## 2024-06-03 MED ORDER — POLYETHYLENE GLYCOL 3350 17 G PO PACK: 17.0000 g | PACK | Freq: Every day | ORAL | 0 refills | Status: DC | PRN

## 2024-06-03 MED ORDER — METOPROLOL TARTRATE 25 MG PO TABS
25.0000 mg | ORAL_TABLET | Freq: Two times a day (BID) | ORAL | Status: DC
  Administered 2024-06-03: 25 mg via ORAL
  Filled 2024-06-03: qty 1

## 2024-06-03 MED ORDER — OXYCODONE HCL 5 MG PO TABS: 5.0000 mg | ORAL_TABLET | ORAL | 0 refills | Status: AC | PRN

## 2024-06-03 NOTE — Discharge Summary (Signed)
 Physician Discharge Summary   Patient: Adam Harris MRN: [id number] DOB: 30-Apr-1946  Admit date:     05/24/2024  Discharge date: 06/03/24  Discharge Physician: Elgie Butter   PCP: [name], MD   Recommendations at discharge:  Please follow up with PCP in one week.  Please follow up with Dr Lanny as recommended.  We have stopped some of your blood pressure meds today, please check your BP at PCP office in one week and restart them slowly.   Discharge Diagnoses: Principal Problem:   Complicated UTI (urinary tract infection) Active Problems:   Essential hypertension   Hyperlipidemia   Chronic diastolic CHF (congestive heart failure), NYHA class 2 (HCC)   CKD (chronic kidney disease), stage IV (HCC)   Permanent atrial fibrillation (HCC)   Nephrostomy present (HCC)   Generalized weakness   Type 2 diabetes mellitus with hyperglycemia (HCC)  Resolved Problems:   * No resolved hospital problems. *  Hospital Course: 78 y.o. male with medical history significant of atrial fibrillation on apixaban , anxiety, arthritis, CAD, NSTEMI, CHF with preserved EF, COVID-19, hematuria, urothelial cancer status post nephrostomy, acquired solitary kidney, stage IV CKD, prostate cancer, fatty liver, hyperlipidemia, hypertension, herpes zoster, thoracic aortic atherosclerosis, type 2 diabetes admitted with complaints of generalized weakness fatigue decreased appetite nausea and lower abdominal pain.  UA shows moderate leukocyte esterase nitrites Many bacteria greater than 50 WBC.  White count was 15.3 on admission.  COVID RSV and influenza were negative.  Creatinine 2.05 on admission.   CT head without contrast no evidence of acute intercranial abnormality.  CT abdomen/pelvis with contrast showing bladder wall thickening diffuse perivesical fat stranding and mild perinephric stranding about the mid and distal ureter.  Differential considerations include progression of bladder cancer versus infection.  New and  increased retroperitoneal, iliac chain and perirectal lymph nodes when compared to MRI from 03/31/2023 suggesting metastatic disease.  Assessment and Plan:   Complicated UTI/ pyelonephritis in the setting of solitary kidney and left nephrostomy tube.  Urothelial cancer s/p  nephrostomy.  Completed 7 days of IV antibiotics ( cefepime ) Left iliac LN biopsy , done , recommend follow up with Dr Lanny for the final results.      Hypertension:  BP parameters has improved from yesterday, restarted metoprolol .  Restart home meds slowly in the next 1 week     Chronic diastolic CHF.  Stable.  Holding lasix , metolazone  and ACE inhibitor. Please restart meds on in one week after discussing with PCP.        Permanent afib.  Rate controlled. Not on anti coagulation.      Hyperlipidemia Resume statin.    Constipation Resolved.      Hyponatremia Mild at 134.    CKD (chronic kidney disease), stage IV (HCC) Creatinine 2.47 from 2.15 from 2.05 (hold Lasix , metolazone , ACE inhibitor, BP also running soft) -Creatinine has improved to 2.30 Monitor renal function and electrolytes. Continue sodium bicarbonate  650 mg po BID.     Type2 DM:  Resume home meds.      Consultants: urology and oncology.  Procedures performed: LN biopsy  Disposition: Home Diet recommendation:  Discharge Diet Orders (From admission, onward)     Start     Ordered   06/03/24 0000  Diet - low sodium heart healthy        06/03/24 1013           Regular diet DISCHARGE MEDICATION: Allergies as of 06/03/2024       Reactions  Xarelto  [rivaroxaban ] Other (See Comments)   Skin Blisters         Medication List     STOP taking these medications    acetaminophen -codeine  300-30 MG tablet Commonly known as: TYLENOL  #3   amLODipine  5 MG tablet Commonly known as: NORVASC    furosemide  20 MG tablet Commonly known as: LASIX    metolazone  2.5 MG tablet Commonly known as: ZAROXOLYN    olmesartan  40 MG  tablet Commonly known as: BENICAR    Vitamin D3 1000 units Caps       TAKE these medications    aspirin  EC 81 MG tablet Take 1 tablet (81 mg total) by mouth daily. Swallow whole.   hydrOXYzine  25 MG capsule Commonly known as: VISTARIL  Take 1 capsule (25 mg total) by mouth every 8 (eight) hours as needed.   isosorbide  mononitrate 30 MG 24 hr tablet Commonly known as: IMDUR  Take 1 tablet (30 mg total) by mouth daily. Start taking on: June 06, 2024 What changed: These instructions start on June 06, 2024. If you are unsure what to do until then, ask your doctor or other care provider.   KEYTRUDA  IV Inject into the vein every 14 (fourteen) days.   metoprolol  tartrate 25 MG tablet Commonly known as: LOPRESSOR  TAKE ONE TABLET BY MOUTH TWICE A DAY   mirtazapine  30 MG disintegrating tablet Commonly known as: REMERON  SOL-TAB Take 1 tablet (30 mg total) by mouth at bedtime.   nitroGLYCERIN  0.4 MG SL tablet Commonly known as: NITROSTAT  TAKE ONE TABLET UNDER THE TONGUE EVERY 5 MINS FOR THREE DOSES AS NEEDED FOR CHEST PAIN. CALL 911 IF 2ND DOSE DOESN'T HELP   ondansetron  8 MG tablet Commonly known as: ZOFRAN  Take 1 tablet (8 mg total) by mouth every 8 (eight) hours as needed for nausea or vomiting.   oxyCODONE  5 MG immediate release tablet Commonly known as: Oxy IR/ROXICODONE  Take 1 tablet (5 mg total) by mouth every 4 (four) hours as needed for up to 7 days for moderate pain (pain score 4-6).   polyethylene glycol 17 g packet Commonly known as: MIRALAX  / GLYCOLAX  Take 17 g by mouth daily as needed for mild constipation.   rosuvastatin  40 MG tablet Commonly known as: CRESTOR  TAKE 1 TABLET (40 MG TOTAL) BY MOUTH EVERY MORNING. PLEASE KEEP SCHEDULED APPOINTMENT   senna-docusate 8.6-50 MG tablet Commonly known as: Senokot-S Take 1 tablet by mouth 2 (two) times daily.   sitaGLIPtin  50 MG tablet Commonly known as: Januvia  Take 1 tablet (50 mg total) by mouth daily.   sodium  bicarbonate 650 MG tablet Take 650 mg by mouth 2 (two) times daily.   tamsulosin  0.4 MG Caps capsule Commonly known as: FLOMAX  TAKE ONE CAPSULE BY MOUTH DAILY   traZODone  50 MG tablet Commonly known as: DESYREL  Take 0.5-1 tablets (25-50 mg total) by mouth at bedtime as needed for sleep.        Follow-up Information     [name], MD. Schedule an appointment as soon as possible for a visit in 1 week(s).   Specialty: Family Medicine Contact information: [address] [place] [address] [phone]                Discharge Exam: Fredricka Weights   05/24/24 2212 05/25/24 0602  Weight: 89.4 kg 89.6 kg   General exam: Appears calm and comfortable  Respiratory system: Clear to auscultation. Respiratory effort normal. Cardiovascular system: S1 & S2 heard, RRR. No JVD, Gastrointestinal system: Abdomen is nondistended, soft and nontender.  Central nervous system: Alert and oriented. No focal neurological deficits. Extremities: Symmetric 5 x 5 power. Skin: No rashes Psychiatry:Mood & affect appropriate.    Condition at discharge: fair  The results of significant diagnostics from this hospitalization (including imaging, microbiology, ancillary and laboratory) are listed below for reference.   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 The patient presents for lymph node biopsy. EXAM: CT GUIDED CORE BIOPSY OF LEFT ILIAC LYMPH NODE ANESTHESIA/SEDATION: Moderate (conscious) sedation was employed during this procedure. A total of Versed  0.5 mg and Fentanyl  25 mcg was administered intravenously. Moderate Sedation Time: 33 minutes. The patient's level of consciousness and vital signs were monitored continuously by radiology nursing throughout the procedure under my direct supervision. PROCEDURE: The procedure risks, benefits, and alternatives were explained  to the patient. Questions regarding the procedure were encouraged and answered. The patient understands and consents to the procedure. A time out was performed prior to initiating the procedure. RADIATION DOSE REDUCTION: This exam was performed according to the departmental dose-optimization program which includes automated exposure control, adjustment of the mA and/or kV according to patient size and/or use of iterative reconstruction technique. The left lower pelvic abdominal wall was prepped with chlorhexidine  in a sterile fashion, and a sterile drape was applied covering the operative field. A sterile gown and sterile gloves were used for the procedure. Local anesthesia was provided with 1% Lidocaine . Under CT guidance, a 17 gauge trocar needle was advanced to the level of a left external iliac lymph node. After confirming needle tip position, 3 separate 18 gauge coaxial core biopsy samples were obtained and submitted in saline. Gel-Foam pledgets were advanced through the outer needle prior to its retraction and removal. Additional CT was performed. COMPLICATIONS: Minimal hemorrhage anterior to lymph node. FINDINGS: Initial CT demonstrates enlarged left external iliac lymph nodes with the largest measuring up to approximately 3 cm. Solid tissue was obtained with core biopsy. Due to some bleeding emanating from the outer needle with biopsy, Gel-Foam pledgets were advanced through the trocar needle prior to its removal. Post biopsy imaging demonstrates a small amount of hemorrhage anterior to the lymph node after biopsy. IMPRESSION: CT-guided core biopsy performed of enlarged left external iliac lymph node. Electronically Signed   By: Marcey Moan M.D.   On: 05/30/2024 13:18   CT ABDOMEN PELVIS WO CONTRAST Result Date: 05/25/2024 CLINICAL DATA:  Acute nonlocalized abdominal pain. Left flank pain. Generalized abdominal tenderness. Fatigue, weakness, difficulty sleeping. Left nephrostomy EXAM: CT ABDOMEN AND  PELVIS WITHOUT CONTRAST TECHNIQUE: Multidetector CT imaging of the abdomen and pelvis was performed following the standard protocol without IV contrast. RADIATION DOSE REDUCTION: This exam was performed according to the departmental dose-optimization program which includes automated exposure control, adjustment of the mA and/or kV according to patient size and/or use of iterative reconstruction technique. COMPARISON:  MRI 03/22/2023 and CT 02/27/2022 FINDINGS: Lower chest: No acute abnormality. Hepatobiliary: Unremarkable noncontrast appearance of the liver, gallbladder, and biliary tree. Pancreas: Unremarkable. Spleen: Unremarkable. Adrenals/Urinary Tract: Unremarkable adrenal glands. Absent right kidney. Left nephrostomy tube. Small locule of gas in the left renal calices. No hydronephrosis. Mild perinephric stranding about the mid and distal ureter. Nondistended bladder. Diffuse perivesical fat stranding. Stomach/Bowel: Normal caliber large and small bowel. No bowel wall thickening. Stomach and appendix are within normal limits. Vascular/Lymphatic: New or increased retroperitoneal and iliac chain lymph nodes compared to MRI 03/31/2023. For example a 1.4 cm right periaortic  lymph node on series 5/image 35; a 1.6 cm right iliac chain lymph node on 5/70; and a 2.2 cm left iliac chain lymph node on 5/67 (this previously measured 0.9 cm on 03/31/2023). New perirectal nodes, for example on 5/76 measuring 1.2 cm. Reproductive: Enlarged prostate. Other: Stranding and small volume free fluid in the pelvis. No free intraperitoneal air. No abscess. Musculoskeletal: No acute fracture or destructive osseous lesion. IMPRESSION: 1. Bladder wall thickening. Diffuse perivesical fat stranding and mild perinephric stranding about the mid and distal ureter. Differential considerations include progression of bladder cancer versus infection. 2. New and increased retroperitoneal, iliac chain, and perirectal lymph nodes compared to MRI  03/31/2023 suggesting metastatic disease. Electronically Signed   By: Norman Gatlin M.D.   On: 05/25/2024 00:43   CT Head Wo Contrast Result Date: 05/25/2024 CLINICAL DATA:  Mental status change, unknown cause confusion, progressive weakness, hx/o urothelial cancer EXAM: CT HEAD WITHOUT CONTRAST TECHNIQUE: Contiguous axial images were obtained from the base of the skull through the vertex without intravenous contrast. RADIATION DOSE REDUCTION: This exam was performed according to the departmental dose-optimization program which includes automated exposure control, adjustment of the mA and/or kV according to patient size and/or use of iterative reconstruction technique. COMPARISON:  None Available. FINDINGS: Brain: No evidence of acute infarction, hemorrhage, hydrocephalus, extra-axial collection or obvious mass lesion/mass effect. Vascular: No hyperdense vessel or unexpected calcification. Skull: Normal. Negative for fracture or focal lesion. Sinuses/Orbits: Clear sinuses.  No acute orbital findings. IMPRESSION: No evidence of acute intracranial abnormality. Electronically Signed   By: Gilmore GORMAN Molt M.D.   On: 05/25/2024 00:34    Microbiology: Results for orders placed or performed during the hospital encounter of 05/24/24  Resp panel by RT-PCR (RSV, Flu A&B, Covid) Anterior Nasal Swab     Status: None   Collection Time: 05/24/24 10:16 PM   Specimen: Anterior Nasal Swab  Result Value Ref Range Status   SARS Coronavirus 2 by RT PCR NEGATIVE NEGATIVE Final    Comment: (NOTE) SARS-CoV-2 target nucleic acids are NOT DETECTED.  The SARS-CoV-2 RNA is generally detectable in upper respiratory specimens during the acute phase of infection. The lowest concentration of SARS-CoV-2 viral copies this assay can detect is 138 copies/mL. A negative result does not preclude SARS-Cov-2 infection and should not be used as the sole basis for treatment or other patient management decisions. A negative result  may occur with  improper specimen collection/handling, submission of specimen other than nasopharyngeal swab, presence of viral mutation(s) within the areas targeted by this assay, and inadequate number of viral copies(<138 copies/mL). A negative result must be combined with clinical observations, patient history, and epidemiological information. The expected result is Negative.  Fact Sheet for Patients:  BloggerCourse.com  Fact Sheet for Healthcare Providers:  SeriousBroker.it  This test is no t yet approved or cleared by the [place]  FDA and  has been authorized for detection and/or diagnosis of SARS-CoV-2 by FDA under an Emergency Use Authorization (EUA). This EUA will remain  in effect (meaning this test can be used) for the duration of the COVID-19 declaration under Section 564(b)(1) of the Act, 21 U.S.C.section 360bbb-3(b)(1), unless the authorization is terminated  or revoked sooner.       Influenza A by PCR NEGATIVE NEGATIVE Final   Influenza B by PCR NEGATIVE NEGATIVE Final    Comment: (NOTE) The Xpert Xpress SARS-CoV-2/FLU/RSV plus assay is intended as an aid in the diagnosis of influenza from Nasopharyngeal swab specimens and should not be  used as a sole basis for treatment. Nasal washings and aspirates are unacceptable for Xpert Xpress SARS-CoV-2/FLU/RSV testing.  Fact Sheet for Patients: BloggerCourse.com  Fact Sheet for Healthcare Providers: SeriousBroker.it  This test is not yet approved or cleared by the [place]  FDA and has been authorized for detection and/or diagnosis of SARS-CoV-2 by FDA under an Emergency Use Authorization (EUA). This EUA will remain in effect (meaning this test can be used) for the duration of the COVID-19 declaration under Section 564(b)(1) of the Act, 21 U.S.C. section 360bbb-3(b)(1), unless the authorization is terminated  or revoked.     Resp Syncytial Virus by PCR NEGATIVE NEGATIVE Final    Comment: (NOTE) Fact Sheet for Patients: BloggerCourse.com  Fact Sheet for Healthcare Providers: SeriousBroker.it  This test is not yet approved or cleared by the [place]  FDA and has been authorized for detection and/or diagnosis of SARS-CoV-2 by FDA under an Emergency Use Authorization (EUA). This EUA will remain in effect (meaning this test can be used) for the duration of the COVID-19 declaration under Section 564(b)(1) of the Act, 21 U.S.C. section 360bbb-3(b)(1), unless the authorization is terminated or revoked.  Performed at [facility], [address] [place], [place] [address]   Culture, blood (routine x 2)     Status: None   Collection Time: 05/25/24 12:36 AM   Specimen: BLOOD  Result Value Ref Range Status   Specimen Description   Final    BLOOD RIGHT ANTECUBITAL Performed at [facility], [address] [place], [place] [address]    Special Requests   Final    BOTTLES DRAWN AEROBIC AND ANAEROBIC Blood Culture adequate volume Performed at [facility], [address] [place], [place] [address]    Culture   Final    NO GROWTH 5 DAYS Performed at [facility], [address] [place], [place] [address]    Report Status 05/30/2024 FINAL  Final  Urine Culture     Status: Abnormal   Collection Time: 05/25/24  2:14 AM   Specimen: Urine, Random  Result Value Ref Range Status   Specimen Description   Final    URINE, RANDOM Performed at [facility], [address] [place], [place] [address]    Special Requests   Final    NONE Reflexed from [phone] Performed at [facility], [address] [place], [place] [address]    Culture MULTIPLE SPECIES PRESENT, SUGGEST RECOLLECTION (A)  Final   Report Status 05/26/2024 FINAL  Final    Labs: CBC: Recent Labs  Lab 05/29/24 0603  05/30/24 0521 05/31/24 0627 06/01/24 0558 06/02/24 1301  WBC 17.0* 16.7* 17.4* 15.1* 15.0*  NEUTROABS  --   --   --   --  12.6*  HGB 11.4* 10.9* 11.3* 10.9* 12.0*  HCT 36.5* 36.2* 37.1* 35.6* 40.0  MCV 75.9* 77.7* 75.7* 76.1* 76.6*  PLT 243 219 254 266 309   Basic Metabolic Panel: Recent Labs  Lab 05/29/24 0603 05/30/24 0521 05/31/24 0627 06/01/24 0558 06/02/24 1301  NA 130* 130* 129* 133* 134*  K 3.5 3.6 3.6 3.9 4.3  CL 95* 96* 96* 99 99  CO2 25 25 25 25 22   GLUCOSE 139* 193* 149* 129* 234*  BUN 34* 33* 32* 29* 31*  CREATININE 2.27* 2.36* 2.25* 2.30* 2.34*  CALCIUM  8.3* 8.4* 8.4* 8.5* 8.8*   Liver Function Tests: Recent Labs  Lab 05/28/24 0554 05/29/24 9396  05/30/24 0521 05/31/24 0627  AST 22 17 20 26   ALT 15 12 14 16   ALKPHOS 123 125 124 142*  BILITOT 1.5* 1.7* 1.4* 1.4*  PROT 7.0 7.1 6.8 7.0  ALBUMIN  2.3* 2.1* 2.0* 2.1*   CBG: Recent Labs  Lab 06/02/24 1146 06/02/24 1630 06/02/24 2147 06/03/24 0743 06/03/24 1125  GLUCAP 122* 226* 151* 162* 178*    Discharge time spent: 41 minutes.   Signed: Elgie Butter, MD [facility] 06/03/2024

## 2024-06-03 NOTE — Evaluation (Signed)
 Physical Therapy Brief Evaluation and Discharge Note Patient Details Name: Adam Harris MRN: [id number] DOB: Aug 28, 1946 Today's Date: 06/03/2024   History of Present Illness  78 y.o. male admitted with complaints of generalized weakness fatigue decreased appetite nausea and lower abdominal pain. Dx of complicated UTI, CKD.  Pt with medical history significant of atrial fibrillation on apixaban , anxiety, arthritis, CAD, NSTEMI, CHF with preserved EF, COVID-19, hematuria, urothelial cancer status post nephrostomy, acquired solitary kidney, stage IV CKD, prostate cancer, fatty liver, hyperlipidemia, hypertension, herpes zoster, thoracic aortic atherosclerosis, type 2 diabetes  Clinical Impression  Patient evaluated by Physical Therapy with no further acute PT needs identified. All education has been completed and the patient has no further questions.  See below for any follow-up Physical Therapy or DME needs. PT is signing off. Thank you for this referral.      PT Assessment    Assistance Needed at Discharge       Equipment Recommendations None recommended by PT  Recommendations for Other Services       Precautions/Restrictions Precautions Precautions: Fall Recall of Precautions/Restrictions: Intact Precaution/Restrictions Comments: L nephrostomy drain Restrictions Weight Bearing Restrictions Per Provider Order: No        Mobility  Bed Mobility          Transfers Overall transfer level: Modified independent                      Ambulation/Gait Ambulation/Gait assistance: Supervision Gait Distance (Feet): 350 Feet Assistive device: Rolling walker (2 wheels) Gait Pattern/deviations: Step-through pattern   General Gait Details: min cues for posture, no overt LOB  Home Activity Instructions    Stairs Stairs: Yes Stairs assistance: Supervision Stair Management: Two rails Number of Stairs: 10 General stair comments: min cues and step to pattern descending steps  and reciprocal pattern ascending steps  Modified Rankin (Stroke Patients Only)        Balance                          Pertinent Vitals/Pain   Pain Assessment Pain Assessment: No/denies pain Breathing: normal Negative Vocalization: none Facial Expression: smiling or inexpressive Body Language: relaxed Consolability: no need to console PAINAD Score: 0     Home Living   Living Arrangements: Spouse/significant other       Home Equipment: Grab bars - tub/shower;Cane - single Librarian, academic (2 wheels)        Prior Function        UE/LE Assessment               Communication   Communication Communication: No apparent difficulties     Cognition         General Comments      Exercises     Assessment/Plan    PT Problem List         PT Visit Diagnosis Other abnormalities of gait and mobility (R26.89)    No Skilled PT Patient is modified independent with all activity/mobility   Co-evaluation                AMPAC 6 Clicks Help needed turning from your back to your side while in a flat bed without using bedrails?: None Help needed moving from lying on your back to sitting on the side of a flat bed without using bedrails?: None Help needed moving to and from a bed to a chair (including a wheelchair)?: None Help needed standing up from a  chair using your arms (e.g., wheelchair or bedside chair)?: None Help needed to walk in hospital room?: None Help needed climbing 3-5 steps with a railing? : A Little 6 Click Score: 23      End of Session Equipment Utilized During Treatment: Gait belt Activity Tolerance: Patient tolerated treatment well Patient left: in bed;with bed alarm set;with call bell/phone within reach Nurse Communication: Mobility status PT Visit Diagnosis: Other abnormalities of gait and mobility (R26.89)     Time: 8983-8967 PT Time Calculation (min) (ACUTE ONLY): 16 min  Charges:   PT Evaluation $PT Eval  Low Complexity: 1 Low      Adam Harris, PT Acute Rehab   Adam Harris Adam Harris  06/03/2024, 12:52 PM

## 2024-06-03 NOTE — Plan of Care (Signed)

## 2024-06-03 NOTE — Care Management Important Message (Signed)
 Important Message  Patient Details IM Letter given. Name: Adam Harris MRN: [id number] Date of Birth: [birth year]   Important Message Given:  Yes - Medicare IM     [name] 06/03/2024, 10:21 AM

## 2024-06-03 NOTE — Progress Notes (Signed)
 Patient's wife arrived to pick patient up.

## 2024-06-03 NOTE — Progress Notes (Signed)
 Patient discharge home, IV removed, discharge paperwork provided and explained to patient as well as patient's wife over the phone, both patient and patient's wife verbalized understanding, awaiting patient's wife to pick patient up.

## 2024-06-04 ENCOUNTER — Telehealth: Payer: Self-pay | Admitting: Hematology

## 2024-06-04 ENCOUNTER — Other Ambulatory Visit: Payer: Self-pay

## 2024-06-04 ENCOUNTER — Other Ambulatory Visit: Payer: Self-pay | Admitting: Hematology

## 2024-06-04 ENCOUNTER — Telehealth: Payer: Self-pay

## 2024-06-04 DIAGNOSIS — C689 Malignant neoplasm of urinary organ, unspecified: Secondary | ICD-10-CM

## 2024-06-04 DIAGNOSIS — C679 Malignant neoplasm of bladder, unspecified: Secondary | ICD-10-CM | POA: Insufficient documentation

## 2024-06-04 MED ORDER — CARBOXYMETHYLCELL-GLYCERIN PF 0.5-0.9 % OP SOLN: 2.0000 [drp] | Freq: Four times a day (QID) | OPHTHALMIC | 11 refills | Status: DC

## 2024-06-04 MED ORDER — LIDOCAINE-PRILOCAINE 2.5-2.5 % EX CREA: TOPICAL_CREAM | CUTANEOUS | 3 refills | Status: DC

## 2024-06-04 MED ORDER — PROCHLORPERAZINE MALEATE 10 MG PO TABS: 10.0000 mg | ORAL_TABLET | Freq: Four times a day (QID) | ORAL | 1 refills | Status: DC | PRN

## 2024-06-04 MED ORDER — ONDANSETRON HCL 8 MG PO TABS: 8.0000 mg | ORAL_TABLET | Freq: Three times a day (TID) | ORAL | 1 refills | Status: DC | PRN

## 2024-06-04 NOTE — Transitions of Care (Post Inpatient/ED Visit) (Signed)
   06/04/2024  Name: Adam Harris MRN: [id number] DOB: 11-02-1946  Today's TOC FU Call Status: Today's TOC FU Call Status:: Unsuccessful Call (1st Attempt) Unsuccessful Call (1st Attempt) Date: 06/04/24  Attempted to reach the patient regarding the most recent Inpatient/ED visit. Left a HIPAA approved voicemail message to phone number provided in demographics per DPR.    Follow Up Plan: Additional outreach attempts will be made to reach the patient to complete the Transitions of Care (Post Inpatient/ED visit) call.   Richerd Fish, RN, BSN, CCM [facility], [facility] RN Care Manager Direct Dial: [phone]

## 2024-06-04 NOTE — Telephone Encounter (Signed)
 Scheduled appointments per WQ. Called and left VM with appointment details for the patient.

## 2024-06-04 NOTE — Telephone Encounter (Signed)
 Received telephone call from the patient's spouse requesting to speak with Dr. Lanny about the test and scans that the patient just recently had done. Let spouse know that I would forward her message request to Dr. Lanny.

## 2024-06-04 NOTE — Progress Notes (Signed)
 DISCONTINUE ON PATHWAY REGIMEN - Bladder     A cycle is every 21 days:     Pembrolizumab    **Always confirm dose/schedule in your pharmacy ordering system**  PRIOR TREATMENT: [id number]: Pembrolizumab  200 mg q21 Days Until Progression,  Unacceptable Toxicity, or up to 24 Months  START ON PATHWAY REGIMEN - Bladder     A cycle is every 21 days:     Enfortumab vedotin-ejfv      Pembrolizumab    **Always confirm dose/schedule in your pharmacy ordering system**  Patient Characteristics: Advanced/Metastatic Disease, First Line Therapeutic Status: Advanced/Metastatic Disease Line of Therapy: First Line Intent of Therapy: Non-Curative / Palliative Intent, Discussed with Patient

## 2024-06-05 ENCOUNTER — Telehealth: Payer: Self-pay

## 2024-06-05 DIAGNOSIS — I509 Heart failure, unspecified: Secondary | ICD-10-CM | POA: Diagnosis not present

## 2024-06-05 DIAGNOSIS — I4819 Other persistent atrial fibrillation: Secondary | ICD-10-CM | POA: Diagnosis not present

## 2024-06-05 DIAGNOSIS — E1122 Type 2 diabetes mellitus with diabetic chronic kidney disease: Secondary | ICD-10-CM | POA: Diagnosis not present

## 2024-06-05 DIAGNOSIS — F419 Anxiety disorder, unspecified: Secondary | ICD-10-CM | POA: Diagnosis not present

## 2024-06-05 DIAGNOSIS — I130 Hypertensive heart and chronic kidney disease with heart failure and stage 1 through stage 4 chronic kidney disease, or unspecified chronic kidney disease: Secondary | ICD-10-CM | POA: Diagnosis not present

## 2024-06-05 DIAGNOSIS — I700 Atherosclerosis of aorta: Secondary | ICD-10-CM | POA: Diagnosis not present

## 2024-06-05 DIAGNOSIS — N183 Chronic kidney disease, stage 3 unspecified: Secondary | ICD-10-CM | POA: Diagnosis not present

## 2024-06-05 DIAGNOSIS — M1990 Unspecified osteoarthritis, unspecified site: Secondary | ICD-10-CM | POA: Diagnosis not present

## 2024-06-05 DIAGNOSIS — C679 Malignant neoplasm of bladder, unspecified: Secondary | ICD-10-CM | POA: Diagnosis not present

## 2024-06-05 NOTE — Transitions of Care (Post Inpatient/ED Visit) (Signed)
   06/05/2024  Name: Adam Harris MRN: [id number] DOB: [birth year]  Today's TOC FU Call Status: Today's TOC FU Call Status:: Unsuccessful Call (2nd Attempt) Unsuccessful Call (2nd Attempt) Date: 06/05/24  Attempted to reach the patient regarding the most recent Inpatient/ED visit. Spoke with patient, weak voice, requested a call back in a couple of hours or so.  Follow Up Plan: Additional outreach attempts will be made to reach the patient to complete the Transitions of Care (Post Inpatient/ED visit) call.   Richerd Fish, RN, BSN, CCM [facility], [facility] RN Care Manager Direct Dial: [phone]

## 2024-06-05 NOTE — Transitions of Care (Post Inpatient/ED Visit) (Signed)
   06/05/2024  Name: Adam Harris MRN: [id number] DOB: 20-Jul-1946  Today's TOC FU Call Status: Today's TOC FU Call Status:: Unsuccessful Call (3rd Attempt) Unsuccessful Call (3rd Attempt) Date: 06/05/24 (Patient previously request callback in a couple of hours - no answer) Left a HIPAA approved voicemail message to phone number provided in demographics per DPR.    Attempted to reach the patient regarding the most recent Inpatient/ED visit.  Follow Up Plan: No further outreach attempts will be made at this time. We have been unable to contact the patient.  Richerd Fish, RN, BSN, CCM [facility], [facility] RN Care Manager Direct Dial: [phone]

## 2024-06-05 NOTE — Telephone Encounter (Signed)
 Copied from CRM [id number]. Topic: Clinical - Home Health Verbal Orders >> Jun 05, 2024  1:41 PM [place] wrote: Caller/Agency: [facility] Callback Number: [phone] Service Requested: Physical Therapy Frequency: 1 week 1;  2 week 1; 1 week 3 Any new concerns about the patient? Yes- patient has been taking medication that he's not supposed to be since discharged from hospital- on discontinued medicaton list - Also wanting to get a request to add skilled nursing for medication and condition management

## 2024-06-06 ENCOUNTER — Telehealth: Payer: Self-pay | Admitting: Hematology

## 2024-06-06 NOTE — Telephone Encounter (Signed)
 Rescheduled appointment per 7/24 secure chat from [facility] RN. Called and left a VM with appointment details for the patient as well as the reasoning for the reschedule.

## 2024-06-10 ENCOUNTER — Telehealth: Payer: Self-pay | Admitting: Hematology

## 2024-06-10 NOTE — Telephone Encounter (Signed)
 Canceled appointment per 7/29 secure chat. Talked with the patient and he is aware of the canceled appointment. He was also reminded of his appointments this Thursday 7/31 for labs and a follow-up with Dr.Feng starting at 2:45.

## 2024-06-11 ENCOUNTER — Ambulatory Visit: Payer: Self-pay

## 2024-06-11 NOTE — Telephone Encounter (Signed)
 Called patient to schedule OV with Roselie Mood NP on 06/12/2024 @ 10:00 am to address concerns due to PCP not having any open slots; left VM for call back.

## 2024-06-11 NOTE — Telephone Encounter (Signed)
 Please see other NT encounter from today.

## 2024-06-11 NOTE — Telephone Encounter (Signed)
 Nurse unable to complete triage since wife requesting call back around 3 pm due to being at appt. Per limited info gathered, advised pt wife to call 911 if truly confused or weak, especially if worsening. Placing in call back for further triage per wife request.     Copied from CRM [id number]. 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   Answer Assessment - Initial Assessment Questions Pt wife is poor historian, pt wife reporting that pt is poor historian  1. LEVEL OF CONSCIOUSNESS: How are they (the patient) acting right now? (e.g., alert-oriented, confused, lethargic, stuporous, comatose)     He's okay but taking pain med or not, may need different pain med, pain med makes him feel awful Gets nauseous, acts all out of it, having trouble walking when he takes that medicine Got nausea medicine that helps 2. ONSET: When did the confusion start?  (e.g., minutes, hours, days)     A while 5. NARCOTIC MEDICINES: Have they been receiving any narcotic medications? (e.g., morphine , Vicodin)     Yes, think just when takes his medicine 6. CAUSE: What do you think is causing the confusion?      Thinks medicine narcotic  May have to call back, going to doc office myself  Prefer that give a call back to wife maybe around 3 pm or something Confirms think pt in severe state really weak really confused Only when takes med More himself without med, not completely himself without med though He's not good at telling stuff, he won't tell you everything Can stand No cold/clammy skin Breathing okay  Just need something to help him to eat, no appetite, Dr. Thedora can look at his records, was in hospital for 11  days  Pt wife catching her breath while walking, confirms that she has COPD and SOB just when walking without something to hold onto, confirms same as usual    Nurse unable to complete triage since wife requesting call back around 3 pm due to being at appt. Advised pt wife to call 911 if truly confused or weak, especially if worsening. Placing in call back for further triage per wife request.  Protocols used: Confusion - Delirium-A-AH

## 2024-06-11 NOTE — Progress Notes (Deleted)
  Electrophysiology Office Note:   Date:  06/11/2024  ID:  Jaquann Guarisco, DOB [birth year], MRN [id number]  Primary Cardiologist: Lynwood Schilling, MD Electrophysiologist: Fonda Kitty, MD  {Click to update primary MD,subspecialty MD or APP then REFRESH:1}    History of Present Illness:   Adam Harris is a 78 y.o. 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  Since last being seen in our clinic the patient reports doing ***.  he denies chest pain, palpitations, dyspnea, PND, orthopnea, nausea, vomiting, dizziness, syncope, edema, weight gain, or early satiety.    Review of systems complete and found to be negative unless listed in HPI.   EP Information / Studies Reviewed:    EKG is not ordered today. EKG from 05/24/2024 reviewed which showed AF at 81 bpm       Arrhythmia/Device History No specialty comments available.   Physical Exam:   VS:  There were no vitals taken for this visit.   Wt Readings from Last 3 Encounters:  05/25/24 197 lb 8.5 oz (89.6 kg)  05/19/24 197 lb (89.4 kg)  05/01/24 203 lb (92.1 kg)     GEN: No acute distress NECK: No JVD; No carotid bruits CARDIAC: {EPRHYTHM:28826}, no murmurs, rubs, gallops RESPIRATORY:  Clear to auscultation without rales, wheezing or rhonchi  ABDOMEN: Soft, non-tender, non-distended EXTREMITIES:  {EDEMA LEVEL:28147::No} edema; No deformity   ASSESSMENT AND PLAN:    Atrial fibrillation S/p Watchman 12/13/2023 No leak by TEE 02/11/2024 Now 6 months post op.  Can stop plavix , and with h/o CAD will transition to daily baby ASA alone.   CAD No s/s of ischemia.      Secondary hypercoagulable  state Plan as above now 6 months post watchman.   {Click here to Review PMH, Prob List, Meds, Allergies, SHx, FHx  :1}   Follow up with EP Team {EPFOLLOW LE:71826}  Signed, Ozell Prentice Passey, PA-C

## 2024-06-11 NOTE — Telephone Encounter (Signed)
 FYI Only or Action Required?: Action required by provider: clinical question for provider and update on patient condition.  Patient was last seen in primary care on 05/19/2024 by [name], MD.  Called Nurse Triage reporting LACK OF APPETITE , Fatigue, and Back Pain.  Symptoms began several months ago.  Interventions attempted: Nothing.  Symptoms are: gradually worsening.  Triage Disposition: See PCP Within 2 Weeks  Patient/caregiver understands and will follow disposition?: No, wishes to speak with PCP                                Copied from CRM [id number]. 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 Reason for Disposition  Weakness is a chronic symptom (recurrent or ongoing AND present > 4 weeks)  Answer Assessment - Initial Assessment Questions 1. DESCRIPTION: Describe how you are feeling.     Lack of appetite causing fatigue and weakness  2. SEVERITY: How bad is it?  Can you stand and walk?     Wife reports patient is able to walk around, but does fall frequently- wife states this is not a new problem  3. ONSET: When did these symptoms begin? (e.g., hours, days, weeks, months)     Since April 4. CAUSE: What do you think is causing the weakness or fatigue? (e.g., not drinking enough fluids, medical problem, trouble sleeping)     Lack of appetite 6. OTHER SYMPTOMS: Do you have any other symptoms? (e.g., chest pain, fever, cough, SOB, vomiting, diarrhea, bleeding, other areas of pain)     Lack of appetite, weight loss (estimates 15 lbs), nausea    Wife, Clarita, is requesting PCP's advice on what she and the patient can do to help patient improve  appetite. Wife is concerned that lack of appetite is causing weight loss, fatigue and weakness. Wife states that patient drinks a fair amount of water , Powerade and milk. Wife states patient will only take a couple of bites of his meals. Wife states patient has been taking Keytruda  for almost a year. Declined appointment. Please advise.  Protocols used: Weakness (Generalized) and Fatigue-A-AH

## 2024-06-11 NOTE — Telephone Encounter (Signed)
Left VM to rtn call. Dm/cma       

## 2024-06-12 ENCOUNTER — Inpatient Hospital Stay (HOSPITAL_BASED_OUTPATIENT_CLINIC_OR_DEPARTMENT_OTHER): Admitting: Hematology

## 2024-06-12 ENCOUNTER — Ambulatory Visit: Attending: Student | Admitting: Student

## 2024-06-12 ENCOUNTER — Other Ambulatory Visit: Payer: Self-pay

## 2024-06-12 ENCOUNTER — Encounter (HOSPITAL_BASED_OUTPATIENT_CLINIC_OR_DEPARTMENT_OTHER): Payer: Self-pay

## 2024-06-12 ENCOUNTER — Other Ambulatory Visit

## 2024-06-12 ENCOUNTER — Inpatient Hospital Stay: Attending: Hematology

## 2024-06-12 VITALS — BP 124/60 | HR 114 | Temp 98.6°F | Resp 19 | Ht 71.0 in | Wt 189.9 lb

## 2024-06-12 DIAGNOSIS — C689 Malignant neoplasm of urinary organ, unspecified: Secondary | ICD-10-CM

## 2024-06-12 DIAGNOSIS — C775 Secondary and unspecified malignant neoplasm of intrapelvic lymph nodes: Secondary | ICD-10-CM

## 2024-06-12 DIAGNOSIS — I482 Chronic atrial fibrillation, unspecified: Secondary | ICD-10-CM

## 2024-06-12 DIAGNOSIS — C679 Malignant neoplasm of bladder, unspecified: Secondary | ICD-10-CM

## 2024-06-12 DIAGNOSIS — I10 Essential (primary) hypertension: Secondary | ICD-10-CM

## 2024-06-12 DIAGNOSIS — I25119 Atherosclerotic heart disease of native coronary artery with unspecified angina pectoris: Secondary | ICD-10-CM

## 2024-06-12 LAB — CBC WITH DIFFERENTIAL (CANCER CENTER ONLY)
Abs Immature Granulocytes: 0.08 K/uL — ABNORMAL HIGH (ref 0.00–0.07)
Basophils Absolute: 0.2 K/uL — ABNORMAL HIGH (ref 0.0–0.1)
Basophils Relative: 1 %
Eosinophils Absolute: 0.1 K/uL (ref 0.0–0.5)
Eosinophils Relative: 0 %
HCT: 36.7 % — ABNORMAL LOW (ref 39.0–52.0)
Hemoglobin: 11.7 g/dL — ABNORMAL LOW (ref 13.0–17.0)
Immature Granulocytes: 1 %
Lymphocytes Relative: 6 %
Lymphs Abs: 1.1 K/uL (ref 0.7–4.0)
MCH: 23.4 pg — ABNORMAL LOW (ref 26.0–34.0)
MCHC: 31.9 g/dL (ref 30.0–36.0)
MCV: 73.4 fL — ABNORMAL LOW (ref 80.0–100.0)
Monocytes Absolute: 1.8 K/uL — ABNORMAL HIGH (ref 0.1–1.0)
Monocytes Relative: 10 %
Neutro Abs: 14.3 K/uL — ABNORMAL HIGH (ref 1.7–7.7)
Neutrophils Relative %: 82 %
Platelet Count: 372 K/uL (ref 150–400)
RBC: 5 MIL/uL (ref 4.22–5.81)
RDW: 20.6 % — ABNORMAL HIGH (ref 11.5–15.5)
WBC Count: 17.5 K/uL — ABNORMAL HIGH (ref 4.0–10.5)
nRBC: 0 % (ref 0.0–0.2)

## 2024-06-12 LAB — CMP (CANCER CENTER ONLY)
ALT: 23 U/L (ref 0–44)
AST: 26 U/L (ref 15–41)
Albumin: 2.9 g/dL — ABNORMAL LOW (ref 3.5–5.0)
Alkaline Phosphatase: 181 U/L — ABNORMAL HIGH (ref 38–126)
Anion gap: 7 (ref 5–15)
BUN: 27 mg/dL — ABNORMAL HIGH (ref 8–23)
CO2: 23 mmol/L (ref 22–32)
Calcium: 9 mg/dL (ref 8.9–10.3)
Chloride: 102 mmol/L (ref 98–111)
Creatinine: 1.74 mg/dL — ABNORMAL HIGH (ref 0.61–1.24)
GFR, Estimated: 40 mL/min — ABNORMAL LOW (ref >60)
Glucose, Bld: 181 mg/dL — ABNORMAL HIGH (ref 70–99)
Potassium: 4.7 mmol/L (ref 3.5–5.1)
Sodium: 132 mmol/L — ABNORMAL LOW (ref 135–145)
Total Bilirubin: 1.6 mg/dL — ABNORMAL HIGH (ref 0.0–1.2)
Total Protein: 8.2 g/dL — ABNORMAL HIGH (ref 6.5–8.1)

## 2024-06-12 MED ORDER — MIRTAZAPINE 30 MG PO TABS: 30.0000 mg | ORAL_TABLET | Freq: Every day | ORAL | 0 refills | Status: DC

## 2024-06-12 MED ORDER — MEGESTROL ACETATE 625 MG/5ML PO SUSP: 625.0000 mg | Freq: Every day | ORAL | 0 refills | Status: DC

## 2024-06-12 MED ORDER — OXYCODONE HCL 5 MG PO TABS: 5.0000 mg | ORAL_TABLET | ORAL | 0 refills | Status: DC | PRN

## 2024-06-12 NOTE — Telephone Encounter (Signed)
 Spoke to patient, he has an appointment with his Oncologist tomorrow and having pain and not able to sleep.  I did advise him to call them to see if they can give him something to help with the pain.  He agrees to call them back. Dm/cma

## 2024-06-12 NOTE — Assessment & Plan Note (Addendum)
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  He has had multiple BCG maintenance therapy, is BCG refractory. -Given his BCG refractory disease, recurrence in bladder and the urethra, I recommend systemic therapy with Keytruda  every 3 weeks (or every 6 weeks if he tolerates well) for 2 years, per NCCN guideline.  He had no history of autoimmune disease, overall in good health, there is no contraindication for immunotherapy. --he started on 06/15/2023. C4 was postponed due to dyspnea and hypoxia on exertion, CT chest was negative for pneumonitis  -repeated cystoscopy and cytology was still positive in early Nov 2024. I discussed with Dr. Renda and we decide continue Keytruda  for additional 3 months and re-evaluate  - He had significant bleeding during cystoscopy and required nephrostomy tube placement in 02/2024 -CT in 05/2024 showed worsening pelvic and retroperitoneal adenopathy, biopsy unfortunately showed metastatic urothelial carcinoma. -plan to start Padcev and Keytruda  on 06/18/2024

## 2024-06-13 ENCOUNTER — Ambulatory Visit (HOSPITAL_COMMUNITY)
Admission: RE | Admit: 2024-06-13 | Discharge: 2024-06-13 | Disposition: A | Discharge status: Home / Self Care | Source: Ambulatory Visit | Attending: Interventional Radiology | Admitting: Interventional Radiology

## 2024-06-13 ENCOUNTER — Other Ambulatory Visit (HOSPITAL_COMMUNITY): Payer: Self-pay | Admitting: Interventional Radiology

## 2024-06-13 ENCOUNTER — Encounter (HOSPITAL_COMMUNITY): Payer: Self-pay

## 2024-06-13 ENCOUNTER — Encounter (HOSPITAL_COMMUNITY)
Admission: RE | Admit: 2024-06-13 | Discharge: 2024-06-13 | Disposition: A | Discharge status: Home / Self Care | Source: Ambulatory Visit | Attending: Hematology | Admitting: Hematology

## 2024-06-13 ENCOUNTER — Telehealth: Payer: Self-pay

## 2024-06-13 DIAGNOSIS — N99528 Other complication of other external stoma of urinary tract: Secondary | ICD-10-CM | POA: Insufficient documentation

## 2024-06-13 DIAGNOSIS — C689 Malignant neoplasm of urinary organ, unspecified: Secondary | ICD-10-CM | POA: Insufficient documentation

## 2024-06-13 DIAGNOSIS — C679 Malignant neoplasm of bladder, unspecified: Secondary | ICD-10-CM | POA: Diagnosis not present

## 2024-06-13 DIAGNOSIS — N1330 Unspecified hydronephrosis: Secondary | ICD-10-CM

## 2024-06-13 DIAGNOSIS — R918 Other nonspecific abnormal finding of lung field: Secondary | ICD-10-CM | POA: Diagnosis not present

## 2024-06-13 HISTORY — PX: IR FLUORO PROCEDURE UNLISTED: IMG2388

## 2024-06-13 LAB — GLUCOSE, CAPILLARY: Glucose-Capillary: 170 mg/dL — ABNORMAL HIGH (ref 70–99)

## 2024-06-13 MED ORDER — FLUDEOXYGLUCOSE F - 18 (FDG) INJECTION
9.4100 | Freq: Once | INTRAVENOUS | Status: AC
  Administered 2024-06-13: 9.41 via INTRAVENOUS

## 2024-06-13 NOTE — Progress Notes (Signed)
   06/13/2024  Patient ID: Adam Harris, male   DOB: [birth year], 78 y.o.   MRN: [id number]  This patient is appearing on a report for being at risk of failing the adherence measure for hypertension (ACEi/ARB) medications this calendar year.   Medication: olmesartan  20mg  Last fill date: 05/13/2024 for 90 day supply  Insurance report was not up to date. No action needed at this time.   However, medication was recently discontinued at hospital discharge due to kidney function and soft pressures.  Channing DELENA Mealing, PharmD, DPLA

## 2024-06-13 NOTE — Procedures (Signed)
 Patient presented to IR today with complaints of left nephrostomy tube drainage bag leaking.  Nephrostomy tube dressing was removed and noted to be dry, tube was assessed and noted that suture was no longer intact.  Nephrostomy tube was still draining adequately into drainage bag but bag was noted to be leaking.  The nephrostomy tube was flushed and there was no leaking noted around the tube.  A new drainage bag was attached to the nephrostomy tube and a new dressing, including a stat-loc securement device was placed to hold tube in place due to suture being broken. Patient was instructed to call IR should any additional concerns arise regarding nephrostomy tube or drainage bag.  Patient was also instructed to return on Wednesday, June 18, 2024 at 3:30pm for previously scheduled routine nephrostomy tube exchange.

## 2024-06-14 NOTE — Progress Notes (Signed)
 " [name]   [phone] Fax:([phone]   Clinic Follow up Note   Patient Care Team: [name], MD as PCP - General (Family Medicine) [name], MD as PCP - Cardiology (Cardiology) [name], MD as PCP - Electrophysiology (Cardiology) [name], MD as Consulting Physician (Urology) [name], MD as Consulting Physician (Cardiology) [name], MD as Consulting Physician (Nephrology) [name], MD as Consulting Physician (Hematology and Oncology) [facility]  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  He has had multiple BCG maintenance therapy, is BCG refractory. -Given his BCG refractory disease, recurrence in bladder and the urethra, I recommend systemic therapy with Keytruda  every 3 weeks (or every 6 weeks if he tolerates well) for 2 years, per NCCN guideline.  He had no history of autoimmune disease, overall in good health, there is no contraindication for immunotherapy. --he started on 06/15/2023. C4 was postponed due to dyspnea and hypoxia on exertion, CT chest was negative for pneumonitis  -repeated cystoscopy and cytology was still positive in early Nov 2024. I discussed with Dr. Renda and we decide continue Keytruda  for additional 3 months and re-evaluate  - He had significant bleeding during cystoscopy and required nephrostomy tube placement in 02/2024 -CT in 05/2024 showed  worsening pelvic and retroperitoneal adenopathy, biopsy unfortunately showed metastatic urothelial carcinoma. 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 The cancer is located on the left side and has spread to the lymph nodes. The cancer is still treatable, with a possibility of response or cure in 5-10% of cases. - Continue pembrolizumab  (Keytruda ) treatment. - Initiate Patiservit intravenous treatment every three weeks, two weeks in a row. - Provide education on Patiservit and potential side effects, including low blood counts, fatigue, diarrhea, and skin toxicity. - Monitor blood counts and symptoms closely during treatment.  Cancer-related pain (back, abdomen, left side) Severe cancer-related pain rated at 8 out of 10, primarily in the back, abdomen, and left side. Current pain management with oxycodone  is insufficient, significantly impacting quality of life. - Prescribe oxycodone  1-2 tablets every 4-6 hours as needed for pain. - Refer to symptom management clinic for pain management with nurse practitioner [name]. - Ensure adequate supply of oxycodone  with a prescription for 60 tablets.  Cancer-related anorexia and weight loss Significant weight loss of 10 pounds over the past three weeks due to cancer-related anorexia. Current intake includes one Boost nutritional drink per day, which is insufficient. - Increase intake of high-calorie, high-protein foods and drinks like Boost. - Prescribe Megestrol  acetate ([name]) to stimulate appetite. - Encourage consumption of cooked, soft foods to ease digestion.  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 Current pain management is inadequate, contributing to poor sleep quality. - Prescribe  mirtazapine  tablets for nighttime use  to aid sleep. - Increase oxycodone  dosage to 1-2 tablets at night to manage pain and improve sleep.  Opioid-induced constipation Potential for opioid-induced constipation due to increased oxycodone  use. Current bowel movements are infrequent due to low food intake. - Recommend use of Miralax  to prevent constipation.  Plan -we discussed symptom management, and I will call in [name]  for his appetite, and reviewed mirtazapine  and oxycodone  - Plan to start first cycle of Padcev  and Keytruda  next week - Follow-up the second week of treatment.   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 Mild degradation secondary to lack of IV contrast and motion. 2. Similar to decrease in mild right-sided bladder wall thickening, nonspecific in the setting of prior bladder cancer. Most likely treatment related. 3. Borderline left external iliac and right inguinal adenopathy, decreased. This could represent response to therapy of metastasis or be reactive. 4. No evidence of abdominal metastasis or recurrent disease, status post right nephrectomy.   03/22/2023 Imaging   MR abdomen without contrast  IMPRESSION: 1. Mild degradation secondary to lack of IV contrast and motion. 2. Similar to decrease in mild right-sided bladder wall thickening, nonspecific in the setting of prior bladder cancer. Most likely treatment related. 3. Borderline left external iliac  and right inguinal adenopathy, decreased. This could represent response to therapy of metastasis or be reactive. 4. No evidence of abdominal metastasis or recurrent disease, status post right nephrectomy.     06/08/2023 Cancer Staging   Staging form: Urinary Bladder, AJCC 8th Edition - Clinical stage from 06/08/2023: Stage 0is (cTis, cN0, cM0) - Signed by [name], MD on 06/09/2023 WHO/ISUP grade (low/high): High Grade Histologic grading system: 2 grade system   07/31/2023 Imaging   CT chest without contrast   IMPRESSION: No findings suspicious for pneumonitis.  No evidence of metastatic disease.  Aortic Atherosclerosis (ICD10-I70.0).   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  History of Present Illness Adam Harris is a 78 year old male with metastatic blood cancer who presents for follow-up.  He experiences significant, constant pain in his back, front, and stomach, with increased intensity on the left side, rated as eight out of ten. Oxycodone , one tablet daily, is insufficient for pain relief. He has a poor appetite and has lost ten pounds in the past three weeks. He consumes one Boost nutritional drink per day  but struggles to maintain his weight. He has difficulty sleeping due to pain, despite taking mirtazapine  at night. His daughter assists with his care,  including preparing smoothies and providing nutritional support. He recently experienced a fall, hitting his head on a chair, and has some tenderness in the back.     All other systems were reviewed with the patient and are negative.  MEDICAL HISTORY:  Past Medical History:  Diagnosis Date   Abnormal radiologic findings on diagnostic imaging of renal pelvis, ureter, or bladder    bilateral ureter abnormalities   Anticoagulant long-term use    eliquis    Anxiety    pt denies   Arthritis    Atrial fibrillation, chronic (HCC)    CAD (coronary artery disease) cardiologist-- dr hochrein   NSTEMI 02-04-2014  per cardiac cath chronic occluded RCA w/ faint left-to-right collaterals and aneurysmal LCFx with sluggish coronary flow/   NSTEMI --11-21-2016 per cardiac cath occluded proximal RCA & mid to diastal CFX 100%, med rx. If that does not work, PTCA or CABG   [facility])    bladder and kidney   CHF (congestive heart failure) (HCC)    CKD (chronic kidney disease), stage III (HCC)    patient unaware   DOE (dyspnea on exertion)    Fatty liver    pt denies   Hematuria 02/2019   History of COVID-19 10/2019   History of non-ST elevation myocardial infarction (NSTEMI)    02-04-2014  and 11-21-2016  cardiac cath done both times ,  medically management   History of shingles 12/2017   slight pain and numbness still noted in the area   Hyperlipidemia    Hypertension    Insomnia    Myocardial infarction [facility]) 2015   Persistent atrial fibrillation [facility])    cardiologsit-- dr hochrein   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 Kennyth   Thoracic aortic atherosclerosis (HCC)    Type 2 diabetes mellitus (HCC)    Urinary frequency     SURGICAL HISTORY: Past Surgical History:  Procedure Laterality Date   CARDIAC CATHETERIZATION N/A 11/21/2016   Procedure: Left Heart Cath and Coronary Angiography;  Surgeon: Debby DELENA Sor, MD;   Location: [facility] INVASIVE CV LAB;  Service: Cardiovascular;  Laterality: N/A;  pRCA 100% , ostial LAD 45%, OM3 80%, mCFX to dCFX 100% (AV groove), lateral OM3 50%   COLONOSCOPY     CYSTOSCOPY WITH BIOPSY N/A 08/12/2020   Procedure: CYSTOSCOPY WITH BLADDER BIOPSY AND TRANSURETHRAL RESECTION OF BLADDER TUMOR;  Surgeon: [name], MD;  Location: [facility] ORS;  Service: Urology;  Laterality: N/A;   CYSTOSCOPY WITH BIOPSY N/A 11/10/2021   Procedure: CYSTOSCOPY WITH BLADDER BIOPSIES/ LEFT RETROGRADE;  Surgeon: [name], MD;  Location: [facility];  Service: Urology;  Laterality: N/A;   CYSTOSCOPY WITH BIOPSY Left 07/27/2022   Procedure: CYSTOSCOPY WITH  BLADDER BIOPSY, TRANSURETHRAL FULGERATION OF BLADDER, EXAM UNDER ANESTHESIA, LEFT RETROGRADE PYELOGRAM;  Surgeon: [name], MD;  Location: [facility];  Service: Urology;  Laterality: Left;   CYSTOSCOPY WITH BIOPSY N/A 04/23/2023   Procedure: CYSTOSCOPY WITH BLADDER AND PROSTATIC URETHRAL BIOPSIES;  Surgeon: [name], MD;  Location: [facility];  Service: Urology;  Laterality: N/A;  60 MINUTES NEEDED FOR CASE   CYSTOSCOPY WITH BIOPSY N/A 03/06/2024   Procedure: CYSTOSCOPY, WITH BIOPSY;  Surgeon: [name], MD;  Location: [facility];  Service: Urology;  Laterality: N/A;  CYSTOSCOPY WITH BLADDER BIOPSIES  CYSTOSCOPY WITH FULGERATION N/A 03/06/2024   Procedure: CYSTOSCOPY, WITH BLADDER FULGURATION, TURBT;  Surgeon: [name], MD;  Location: [facility];  Service: Urology;  Laterality: N/A;   CYSTOSCOPY WITH URETEROSCOPY AND STENT PLACEMENT Right 12/15/2019   Procedure: CYSTOSCOPY WITH RIGHT RETROGRADE/ RIGHT URETEROSCOPY/ BIOPSY;  Surgeon: Ottelin, Mark, MD;  Location: [facility];  Service: Urology;  Laterality: Right;   CYSTOSCOPY/RETROGRADE/URETEROSCOPY Bilateral 03/18/2018   Procedure: CYSTOSCOPY/RETROGRADE/URETEROSCOPY.;  Surgeon: Ottelin, Mark, MD;  Location: [facility];  Service: Urology;  Laterality: Bilateral;   EYE  SURGERY Bilateral    cateract in January 2023   HYDROCELE EXCISION Left 07/27/2022   Procedure: HYDROCELE REPAIR;  Surgeon: [name], MD;  Location: [facility];  Service: Urology;  Laterality: Left;  GENERAL ANESTHESIA WITH PARALYSIS   IR NEPHROSTOMY EXCHANGE LEFT  03/10/2024   IR NEPHROSTOMY EXCHANGE LEFT  04/28/2024   IR NEPHROSTOMY PLACEMENT LEFT  03/07/2024   IR PATIENT EVAL TECH 0-60 MINS  06/13/2024   KNEE ARTHROSCOPY Right    LEFT ATRIAL APPENDAGE OCCLUSION N/A 12/13/2023   Procedure: LEFT ATRIAL APPENDAGE OCCLUSION;  Surgeon: [name], MD;  Location: [facility] INVASIVE CV LAB;  Service: Cardiovascular;  Laterality: N/A;   LEFT HEART CATHETERIZATION WITH CORONARY ANGIOGRAM N/A 02/04/2014   Procedure: LEFT HEART CATHETERIZATION WITH CORONARY ANGIOGRAM;  Surgeon: Deatrice DELENA Cage, MD;  Location: [facility] CATH LAB;  Service: Cardiovascular;  Laterality: N/A;  severe one-vessel CAD, chronically occluded RCA with faint left-to-right collaterals;  aneurysmal LCFx with sluggish coronary flow;  normal LVSF w/ moderately elevated LVEDP (ostialOM2 20%, pOM3 20%, pD1 20%, mCFX 50%, diffuse 20% pCFX)   PROSTATE BIOPSY N/A 08/12/2020   Procedure: BIOPSY TRANSRECTAL ULTRASONIC PROSTATE (TUBP);  Surgeon: [name], MD;  Location: [facility];  Service: Urology;  Laterality: N/A;   ROBOT ASSITED LAPAROSCOPIC NEPHROURETERECTOMY Right 03/22/2020   Procedure: XI ROBOT ASSITED LAPAROSCOPIC NEPHROURETERECTOMY;  Surgeon: [name], MD;  Location: [facility];  Service: Urology;  Laterality: Right;   TRANSESOPHAGEAL ECHOCARDIOGRAM (CATH LAB) N/A 12/13/2023   Procedure: TRANSESOPHAGEAL ECHOCARDIOGRAM;  Surgeon: [name], MD;  Location: [facility] INVASIVE CV LAB;  Service: Cardiovascular;  Laterality: N/A;   TRANSESOPHAGEAL ECHOCARDIOGRAM (CATH LAB) N/A 02/11/2024   Procedure: TRANSESOPHAGEAL ECHOCARDIOGRAM;  Surgeon: Barbaraann Darryle Ned, MD;  Location: [facility] INVASIVE CV LAB;  Service: Cardiovascular;  Laterality: N/A;    TRANSTHORACIC ECHOCARDIOGRAM  11-22-2016   dr hochrein   ef 55-60%/ mild MR and TR/ moderate LAE   TRANSURETHRAL RESECTION OF BLADDER TUMOR N/A 02/10/2021   Procedure: TRANSURETHRAL RESECTION OF BLADDER TUMOR (TURBT)/ CYSTOSCOPY/ LEFT RETROGRADE;  Surgeon: [name], MD;  Location: [facility];  Service: Urology;  Laterality: N/A;  GENERAL ANESTHESIA WITH PARALYSIS    I have reviewed the social history and family history with the patient and they are unchanged from previous note.  ALLERGIES:  is allergic to xarelto  [rivaroxaban ].  MEDICATIONS:  Current Outpatient Medications  Medication Sig Dispense Refill   megestrol  ([name]  ES) 625 MG/5ML suspension Take 5 mLs (625 mg total) by mouth daily. 150 mL 0   mirtazapine  (REMERON ) 30 MG tablet Take 1 tablet (30 mg total) by mouth at bedtime. 30 tablet 0   oxyCODONE  (OXY IR/ROXICODONE ) 5 MG immediate release tablet Take 1-2 tablets (5-10 mg total) by mouth every 4 (four) hours as needed for severe pain (pain score 7-10). 60 tablet 0   aspirin  EC 81 MG tablet Take 1 tablet (81 mg total) by mouth daily. Swallow whole. 30 tablet 12   Carboxymethylcell-Glycerin   PF 0.5-0.9 % SOLN Place 2 drops into both eyes 4 (four) times daily. 1 each 11   hydrOXYzine  (VISTARIL ) 25 MG capsule Take 1 capsule (25 mg total) by mouth every 8 (eight) hours as needed. 30 capsule 2   isosorbide  mononitrate (IMDUR ) 30 MG 24 hr tablet Take 1 tablet (30 mg total) by mouth daily.     lidocaine -prilocaine  (EMLA ) cream Apply to affected area once 30 g 3   metoprolol  tartrate (LOPRESSOR ) 25 MG tablet TAKE ONE TABLET BY MOUTH TWICE A DAY 180 tablet 2   nitroGLYCERIN  (NITROSTAT ) 0.4 MG SL tablet TAKE ONE TABLET UNDER THE TONGUE EVERY 5 MINS FOR THREE DOSES AS NEEDED FOR CHEST PAIN. CALL 911 IF 2ND DOSE DOESN'T HELP 25 tablet 11   ondansetron  (ZOFRAN ) 8 MG tablet Take 1 tablet (8 mg total) by mouth every 8 (eight) hours as needed for nausea or vomiting. 20 tablet 0   ondansetron   (ZOFRAN ) 8 MG tablet Take 1 tablet (8 mg total) by mouth every 8 (eight) hours as needed for nausea or vomiting. 30 tablet 1   Pembrolizumab  (KEYTRUDA  IV) Inject into the vein every 14 (fourteen) days. (Patient not taking: Reported on 05/25/2024)     polyethylene glycol (MIRALAX  / GLYCOLAX ) 17 g packet Take 17 g by mouth daily as needed for mild constipation. 14 each 0   prochlorperazine  (COMPAZINE ) 10 MG tablet Take 1 tablet (10 mg total) by mouth every 6 (six) hours as needed for nausea or vomiting. 30 tablet 1   rosuvastatin  (CRESTOR ) 40 MG tablet TAKE 1 TABLET (40 MG TOTAL) BY MOUTH EVERY MORNING. PLEASE KEEP SCHEDULED APPOINTMENT 90 tablet 3   senna-docusate (SENOKOT-S) 8.6-50 MG tablet Take 1 tablet by mouth 2 (two) times daily.     sitaGLIPtin  (JANUVIA ) 50 MG tablet Take 1 tablet (50 mg total) by mouth daily. 90 tablet 3   sodium bicarbonate  650 MG tablet Take 650 mg by mouth 2 (two) times daily.     tamsulosin  (FLOMAX ) 0.4 MG CAPS capsule TAKE ONE CAPSULE BY MOUTH DAILY 30 capsule 11   traZODone  (DESYREL ) 50 MG tablet Take 0.5-1 tablets (25-50 mg total) by mouth at bedtime as needed for sleep. 30 tablet 3   No current facility-administered medications for this visit.    PHYSICAL EXAMINATION: ECOG PERFORMANCE STATUS: 3 - Symptomatic, >50% confined to bed  Vitals:   06/12/24 1537  BP: 124/60  Pulse: (!) 114  Resp: 19  Temp: 98.6 F (37 C)  SpO2: 98%   Wt Readings from Last 3 Encounters:  06/12/24 189 lb 14.4 oz (86.1 kg)  05/25/24 197 lb 8.5 oz (89.6 kg)  05/19/24 197 lb (89.4 kg)     GENERAL:alert, in wheelchair, chronic ill-appearing SKIN: skin color, texture, turgor are normal, no rashes or significant lesions EYES: normal, Conjunctiva are pink and non-injected, sclera clear NECK: supple, thyroid  normal size, non-tender, without nodularity LYMPH:  no palpable lymphadenopathy in the cervical, axillary  LUNGS: clear to auscultation and percussion with normal breathing  effort HEART: regular rate & rhythm and no murmurs and no lower extremity edema ABDOMEN:abdomen soft, non-tender and normal bowel sounds, nephrostomy tube on left side Musculoskeletal:no cyanosis of digits and no clubbing  NEURO: alert & oriented x 3 with fluent speech, no focal motor/sensory deficits  Physical Exam   LABORATORY DATA:  I have reviewed the data as listed    Latest Ref Rng & Units 06/12/2024    3:17 PM 06/02/2024    1:01 PM 06/01/2024  5:58 AM  CBC  WBC 4.0 - 10.5 K/uL 17.5  15.0  15.1   Hemoglobin 13.0 - 17.0 g/dL 88.2  87.9  89.0   Hematocrit 39.0 - 52.0 % 36.7  40.0  35.6   Platelets 150 - 400 K/uL 372  309  266         Latest Ref Rng & Units 06/12/2024    3:17 PM 06/02/2024    1:01 PM 06/01/2024    5:58 AM  CMP  Glucose 70 - 99 mg/dL 818  765  870   BUN 8 - 23 mg/dL 27  31  29    Creatinine 0.61 - 1.24 mg/dL 8.25  7.65  7.69   Sodium 135 - 145 mmol/L 132  134  133   Potassium 3.5 - 5.1 mmol/L 4.7  4.3  3.9   Chloride 98 - 111 mmol/L 102  99  99   CO2 22 - 32 mmol/L 23  22  25    Calcium  8.9 - 10.3 mg/dL 9.0  8.8  8.5   Total Protein 6.5 - 8.1 g/dL 8.2     Total Bilirubin 0.0 - 1.2 mg/dL 1.6     Alkaline Phos 38 - 126 U/L 181     AST 15 - 41 U/L 26     ALT 0 - 44 U/L 23         RADIOGRAPHIC STUDIES: I have personally reviewed the radiological images as listed and agreed with the findings in the report. IR PATIENT EVAL TECH 0-60 MINS Result Date: 06/13/2024 Katha Harlene ORN, RT     06/13/2024  9:48 AM Patient presented to IR today with complaints of left nephrostomy tube drainage bag leaking.  Nephrostomy tube dressing was removed and noted to be dry, tube was assessed and noted that suture was no longer intact.  Nephrostomy tube was still draining adequately into drainage bag but bag was noted to be leaking.  The nephrostomy tube was flushed and there was no leaking noted around the tube.  A new drainage bag was attached to the nephrostomy tube and a new  dressing, including a stat-loc securement device was placed to hold tube in place due to suture being broken. Patient was instructed to call IR should any additional concerns arise regarding nephrostomy tube or drainage bag.  Patient was also instructed to return on Wednesday, June 18, 2024 at 3:30pm for previously scheduled routine nephrostomy tube exchange.      Orders Placed This Encounter  Procedures   CBC with Differential (Cancer Center Only)    Standing Status:   Future    Expected Date:   07/31/2024    Expiration Date:   07/31/2025   CMP (Cancer Center only)    Standing Status:   Future    Expected Date:   07/31/2024    Expiration Date:   07/31/2025   T4    Standing Status:   Future    Expected Date:   07/31/2024    Expiration Date:   07/31/2025   TSH    Standing Status:   Future    Expected Date:   07/31/2024    Expiration Date:   07/31/2025   CBC with Differential (Cancer Center Only)    Standing Status:   Future    Expected Date:   08/07/2024    Expiration Date:   08/07/2025   CMP (Cancer Center only)    Standing Status:   Future    Expected Date:   08/07/2024  Expiration Date:   08/07/2025   Amb Referral to Palliative Care    Referral Priority:   Urgent    Referral Type:   Consultation    Number of Visits Requested:   1   All questions were answered. The patient knows to call the clinic with any problems, questions or concerns. No barriers to learning was detected. The total time spent in the appointment was 30 minutes, including review of chart and various tests results, discussions about plan of care and coordination of care plan     Onita Mattock, MD 06/12/2024    "

## 2024-06-15 NOTE — Progress Notes (Deleted)
 Cardiology Office Note    Date:  06/15/2024  ID:  Adam Harris, Adam Harris 03/16/1946, MRN [id number] PCP:  [name], MD  Cardiologist:  Lynwood Schilling, MD  Electrophysiologist:  Fonda Kitty, MD   Chief Complaint: ***  History of Present Illness: .    Adam Harris is a 78 y.o. male with visit-pertinent history of permanent atrial fibrillation s/p Watchman procedure, CAD, CKD stage IV, heart failure, hypertension, type 2 diabetes, bladder cancer.  Patient with history of permanent atrial fibrillation and coronary artery disease.  Cardiac catheterization in 1/20 teen showed CTO proximal RCA with collateral flow, 45% oh LAD, 80% OM 3, 100% m-d left circumflex(aneurysmal, not amenable to PCI), 50% lateral OM 3 stenosis.  Medical management was recommended.  Patient underwent echocardiogram in 05/2022 that showed EF 66 5%, no RWMA, moderate LVH, normal RV systolic function, mild MR.  In 09/2023 patient was seen by Dr. 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  He was referred for consideration of watchman, ultimately underwent Watchman procedure on 12/13/2023.  Patient was seen by Dr. Schilling on 02/15/2024, at that time patient denied palpitations, shortness of breath, PND, orthopnea.  It was noted his weight was up some and he had not been taking his Lasix .  He was instructed to resume Lasix .  He was pending cystoscopy and was cleared from a cardiovascular standpoint.  Patient presented on 4/24 for surveillance cystoscopy and transurethral bladder biopsies by urology.  Postop patient noted of significant bleeding, clotting off his catheter.  He returned to the OR, underwent fulguration.  Hemoglobin had dropped from 13-8.7 and BP was soft.  He was admitted to SDU.  On 4/25 IR placed left nephrostomy tube.  Patient developed tachycardia and PCCM was consulted.  PCCM suspected  that tachycardia was due to anemia with significant hemoglobin drop.  Patient had also not received his metoprolol  and this was restarted.  Cardiology was consulted regarding tachycardia, patient remained asymptomatic of elevated heart rates.  He denied any chest pain or shortness of breath.  It was noted that patient should restart Plavix  when okay with urology.  Patient was discharged on 03/13/2024.  On chart review on 05/24/2024 patient presented to the emergency department with weakness.  Noted to have complicated UTI/pyelonephritis in setting of solitary kidney and left nephrostomy tube.  Patient completed a course of IV antibiotics.  Patient's creatinine worsened from 2.15-2.47, patient's Lasix , metolazone , ACE inhibitor was held in setting of low blood pressures.  Patient was restarted on metoprolol  prior to discharge.  CT in 05/2024 showed worsening pelvic and retroperitoneal adenopathy, biopsy unfortunately showed metastatic urethral carcinoma with plan to start Padcev  and Keytruda .  Today he presents for follow up. He reports that he   CAD: Cardiac catheterization in 1/20 teen showed CTO proximal RCA with collateral flow, 45% oh LAD, 80% OM 3, 100% m-d left circumflex(aneurysmal, not amenable to PCI), 50% lateral OM 3 stenosis.  Medical management was recommended.  Patient underwent echocardiogram in 05/2022 that showed EF 66 5%, no RWMA, moderate LVH, normal RV systolic function, mild MR Today he reports   Permanent atrial fibrillation:  S/p watchman procedure in 11/2023   Hypertension: Blood pressure today  Urothelail cancer/CKD: S/p nephrostomy.  Last creatinine  Hyperlipidemia: Last lipid profile on Indicated total cholesterol 87, triglycerides 42, HDL 28 and LDL 50.  Labwork independently reviewed:  ROS: .   *** denies chest pain, shortness of breath, lower extremity edema, fatigue, palpitations, melena, hematuria, hemoptysis, diaphoresis, weakness, presyncope, syncope, orthopnea,  and PND.  All other systems are reviewed and otherwise negative.  Studies Reviewed: SABRA    EKG:  EKG is ordered today, personally reviewed, demonstrating ***     CV Studies: Cardiac studies reviewed are outlined and summarized above. Otherwise please see EMR for full report. Cardiac Studies & Procedures   ______________________________________________________________________________________________ CARDIAC CATHETERIZATION  CARDIAC CATHETERIZATION 11/21/2016  Conclusion  Prox RCA lesion, 100 %stenosed.  Ost LAD lesion, 45 %stenosed.  3rd Mrg lesion, 80 %stenosed.  Mid Cx to Dist Cx lesion, 100 %stenosed.  Lat 3rd Mrg lesion, 50 %stenosed.  Multivessel CAD: The LAD had ostial 40 to less than 50% focal stenosis.  There were mild luminal irregularities in the LAD, which extended to the apex but otherwise was without additional significant obstructive disease.  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  The OM1 vessel had 80% stenosis before giving rise to a branch and then had 50% stenosis in the inferior limb.  The AV groove circumflex was 99/100% occluded immediately after the large aneurysmal segment and  there was faint filling of a very large an additional aneurysmal segment with thrombus and very faint filling of the distal posterolateral branches.  There was faint collateralization of the distal posterolateral branches via the inferior branch of the obtuse marginal vessel.  The RCA was totally occluded proximally as had been previously demonstrated and there were faint bridging collaterals supplying the mid RCA which was a nondominant vessel distally.  LVEDP 18 mm Hg.  RECOMMENDATION: Angiographic findings were reviewed with Dr. Claudene.  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  His RCA was totally occluded at that time.  His RCA is essentially unchanged.  His AV groove circumflex is now occluded, which most likely is the culprit lesion.  The patient had been inadequately been taking his anticoagulation.  Heparin  will be resumed tonight with plans to resume oral anticoagulation.  Initially, the patient will be treated with increasing medical therapy.  He is completely pain-free.  He will be started on nitrates, in addition to increased beta blocker therapy.  He is not a candidate for stenting due to the significant aneurysmal dilatations throughout his vessels.  If he develops increasing symptomatology, consider possible PTCA and/or evaluation for surgical revascularization.  Findings Coronary Findings Diagnostic  Dominance: Left  Left Main The left main immediately bifurcated into an LAD and a dominant left circumflex vessel.  Left Anterior Descending The LAD had ostial 40 to less than 50% focal stenosis.  There were mild luminal irregularities in the LAD, which extended to the apex but otherwise was without additional significant obstructive disease  Left Circumflex The left circumflex vessel was a large dominant vessel that had a large region of aneurysmal dilation in the proximal to mid segment prior to bifurcating into a bifurcating marginal branch and the more distal AV groove circumflex.  The OM1 vessel had 80% stenosis before giving rise to a branch and then had 50% stenosis in the inferior limb.  The AV groove circumflex was 99/100% occluded immediately after the large aneurysmal segment and then there was  faint filling of a very large an additional aneurysmal segment with thrombus and very faint filling of the distal posterolateral branches.  There was faint collateralization of the distal posterolateral branches via the inferior branch of the obtuse marginal vessel.  Third Obtuse Marginal  Branch  Lateral Third Obtuse Marginal Branch  Right Coronary Artery The RCA was totally occluded proximally as had been previously demonstrated and there were faint bridging collaterals supplying the mid RCA which was a nondominant vessel distally.  Acute Marginal Branch Collaterals Acute Mrg filled by collaterals from Ost RCA.  Intervention  No interventions have been documented.     ECHOCARDIOGRAM  ECHOCARDIOGRAM COMPLETE 06/12/2022  Narrative ECHOCARDIOGRAM REPORT    Patient Name:   Limmie Schoenberg     Date of Exam: 06/12/2022 Medical Rec #:  [id number]     Height:       71.0 in Accession #:    [id number]    Weight:       224.0 lb Date of Birth:  1945/12/08      BSA:          2.213 m Patient Age:    76 years      BP:           114/78 mmHg Patient Gender: M             HR:           67 bpm. Exam Location:  [address]  Procedure: 2D Echo, Cardiac Doppler and Color Doppler  Indications:    R60.0 Lower extremity swelling  History:        Patient has prior history of Echocardiogram examinations, most recent 11/22/2016. CHF, Previous Myocardial Infarction and CAD, Arrythmias:Atrial Fibrillation; Risk Factors:Hypertension, Diabetes and Dyslipidemia. Exertional angina.  Sonographer:    Jon Hacker RCS Referring Phys: LYNWOOD SCHILLING  IMPRESSIONS   1. Left ventricular ejection fraction, by estimation, is 60 to 65%. The left ventricle has normal function. The left ventricle has no regional wall motion abnormalities. There is moderate left ventricular hypertrophy of the basal-septal segment. Left ventricular diastolic parameters are indeterminate. 2. Right ventricular systolic function is normal. The right ventricular size is normal. There is mildly elevated pulmonary artery systolic pressure. 3. Left atrial size was mildly dilated. 4. The mitral valve is normal in structure. Mild mitral valve regurgitation. No evidence of mitral stenosis. 5. The aortic valve is tricuspid.  There is mild calcification of the aortic valve. Aortic valve regurgitation is not visualized. Aortic valve sclerosis is present, with no evidence of aortic valve stenosis. 6. The inferior vena cava is normal in size with greater than 50% respiratory variability, suggesting right atrial pressure of 3 mmHg.  FINDINGS Left Ventricle: Left ventricular ejection fraction, by estimation, is 60 to 65%. The left ventricle has normal function. The left ventricle has no regional wall motion abnormalities. The left ventricular internal cavity size was normal in size. There is moderate left ventricular hypertrophy of the basal-septal segment. Left ventricular diastolic parameters are indeterminate.  Right Ventricle: The right ventricular size is normal. No increase in right ventricular wall thickness. Right ventricular systolic function is normal. There is mildly elevated pulmonary artery systolic pressure. The tricuspid regurgitant velocity is 3.18 m/s, and with an assumed right atrial pressure of 3 mmHg, the estimated right ventricular systolic pressure is 43.4 mmHg.  Left Atrium: Left atrial size was mildly dilated.  Right Atrium: Right atrial size was normal in size.  Pericardium: There is no evidence of pericardial effusion.  Mitral Valve: The mitral valve is normal in structure. Mild mitral valve regurgitation. No evidence of mitral valve stenosis.  Tricuspid Valve: The tricuspid valve is normal in structure. Tricuspid valve regurgitation is mild . No evidence of tricuspid stenosis.  Aortic Valve: The aortic valve is tricuspid. There is mild calcification of the aortic valve. Aortic valve regurgitation is not visualized. Aortic valve sclerosis is present, with no evidence of aortic valve stenosis.  Pulmonic Valve: The pulmonic valve was normal in structure. Pulmonic valve regurgitation is not visualized. No evidence of pulmonic stenosis.  Aorta: The aortic root is normal in size and  structure.  Venous: The inferior vena cava is normal in size with greater than 50% respiratory variability, suggesting right atrial pressure of 3 mmHg.  IAS/Shunts: No atrial level shunt detected by color flow Doppler.   LEFT VENTRICLE PLAX 2D LVIDd:         3.60 cm LVIDs:         1.70 cm LV PW:         1.00 cm LV IVS:        1.50 cm LVOT diam:     2.00 cm LV SV:         72 LV SV Index:   32 LVOT Area:     3.14 cm   RIGHT VENTRICLE RV Basal diam:  3.00 cm RV S prime:     15.80 cm/s TAPSE (M-mode): 1.6 cm RVSP:           43.4 mmHg  LEFT ATRIUM             Index        RIGHT ATRIUM           Index LA diam:        5.30 cm 2.40 cm/m   RA Pressure: 3.00 mmHg LA Vol (A2C):   51.3 ml 23.19 ml/m  RA Area:     17.70 cm LA Vol (A4C):   89.3 ml 40.36 ml/m  RA Volume:   43.00 ml  19.43 ml/m LA Biplane Vol: 68.0 ml 30.73 ml/m AORTIC VALVE LVOT Vmax:   107.47 cm/s LVOT Vmean:  72.133 cm/s LVOT VTI:    0.228 m  AORTA Ao Root diam: 3.60 cm Ao Asc diam:  3.90 cm  TRICUSPID VALVE TR Peak grad:   40.4 mmHg TR Vmax:        318.00 cm/s Estimated RAP:  3.00 mmHg RVSP:           43.4 mmHg  SHUNTS Systemic VTI:  0.23 m Systemic Diam: 2.00 cm  Oneil Parchment MD Electronically signed by Oneil Parchment MD Signature Date/Time: 06/12/2022/2:29:34 PM    Final   TEE  ECHO TEE 02/11/2024  Narrative TRANSESOPHOGEAL ECHO REPORT    Patient Name:   DANA DORNER  Date of Exam: 02/11/2024 Medical Rec #:  [id number]  Height:       71.0 in Accession #:    [id number] Weight:       219.4 lb Date of Birth:  [birth year]   BSA:          2.193 m Patient Age:    78 years   BP:           156/107 mmHg Patient Gender: M          HR:           82 bpm. Exam Location:  Inpatient  Procedure: 3D Echo, Transesophageal Echo, Cardiac Doppler and Color Doppler (Both Spectral  and Color Flow Doppler were utilized during procedure).  Indications:     Watchman Evaluation  History:         Patient has prior  history of Echocardiogram examinations, most recent 12/13/2023. CAD; Risk Factors:Hypertension, Diabetes and Dyslipidemia. Watchman Device Implanted 12/13/23.  Sonographer:     Damien Senior RDCS Sonographer#2:   Koleen Popper Referring Phys:  [id number] Diagnosing Phys: Darryle Decent MD  PROCEDURE: After discussion of the risks and benefits of a TEE, an informed consent was obtained from the patient. TEE procedure time was 12 minutes. The transesophogeal probe was passed without difficulty through the esophogus of the patient. Imaged were obtained with the patient in a left lateral decubitus position. Sedation performed by different physician. The patient was monitored while under deep sedation. Anesthestetic sedation was provided intravenously by Anesthesiology: 470mg  of Propofol , 60mg  of Lidocaine . Image quality was excellent. The patient's vital signs; including heart rate, blood pressure, and oxygen saturation; remained stable throughout the procedure. The patient developed no complications during the procedure.  IMPRESSIONS   1. 27 mm Watchman FLX is LAA. No device leak or device-related thrombus. Left atrial size was severely dilated. No left atrial/left atrial appendage thrombus was detected. 2. Left ventricular ejection fraction, by estimation, is 60 to 65%. The left ventricle has normal function. The left ventricle has no regional wall motion abnormalities. 3. Right ventricular systolic function is normal. The right ventricular size is normal. 4. Right atrial size was mild to moderately dilated. 5. The mitral valve is grossly normal. Mild to moderate mitral valve regurgitation. No evidence of mitral stenosis. 6. The aortic valve is tricuspid. Aortic valve regurgitation is trivial. No aortic stenosis is present. 7. There is mild (Grade II) protruding plaque involving the descending aorta and aortic arch. 8. 3D performed of the LAA and demonstrates 3D LAA assessment of  Watchman device. Normal.  FINDINGS Left Ventricle: Left ventricular ejection fraction, by estimation, is 60 to 65%. The left ventricle has normal function. The left ventricle has no regional wall motion abnormalities. The left ventricular internal cavity size was normal in size.  Right Ventricle: The right ventricular size is normal. No increase in right ventricular wall thickness. Right ventricular systolic function is normal.  Left Atrium: 27 mm Watchman FLX is LAA. No device leak or device-related thrombus. Left atrial size was severely dilated. No left atrial/left atrial appendage thrombus was detected.  Right Atrium: Right atrial size was mild to moderately dilated.  Pericardium: There is no evidence of pericardial effusion.  Mitral Valve: The mitral valve is grossly normal. Mild to moderate mitral valve regurgitation. No evidence of mitral valve stenosis.  Tricuspid Valve: The tricuspid valve is grossly normal. Tricuspid valve regurgitation is mild . No evidence of tricuspid stenosis.  Aortic Valve: The aortic valve is tricuspid. Aortic valve regurgitation is trivial. No aortic stenosis is present.  Pulmonic Valve: The pulmonic valve was grossly normal. Pulmonic valve regurgitation is trivial. No evidence of pulmonic stenosis.  Aorta: The aortic root and ascending aorta are structurally normal, with no evidence of dilitation. There is mild (Grade II) protruding plaque involving the descending aorta and aortic arch.  Venous: The left upper pulmonary vein, left lower pulmonary vein, right lower pulmonary vein and right upper pulmonary vein are normal.  IAS/Shunts: The atrial septum is grossly normal.  Additional Comments: 3D was performed not requiring image post processing on an independent workstation and was normal. Spectral Doppler performed.   AORTA Ao Root diam: 3.32 cm Ao  Asc diam:  3.85 cm  TRICUSPID VALVE TR Peak grad:   38.4 mmHg TR Vmax:        310.00 cm/s  Darryle Decent MD Electronically signed by Darryle Decent MD Signature Date/Time: 02/11/2024/12:13:50 PM    Final        ______________________________________________________________________________________________       Current Reported Medications:.    No outpatient medications have been marked as taking for the 06/16/24 encounter (Appointment) with Taysen Bushart D, NP.    Physical Exam:    VS:  There were no vitals taken for this visit.   Wt Readings from Last 3 Encounters:  06/12/24 189 lb 14.4 oz (86.1 kg)  05/25/24 197 lb 8.5 oz (89.6 kg)  05/19/24 197 lb (89.4 kg)    GEN: Well nourished, well developed in no acute distress NECK: No JVD; No carotid bruits CARDIAC: ***RRR, no murmurs, rubs, gallops RESPIRATORY:  Clear to auscultation without rales, wheezing or rhonchi  ABDOMEN: Soft, non-tender, non-distended EXTREMITIES:  No edema; No acute deformity     Asessement and Plan:.     ***     Disposition: F/u with ***  Signed, Emoni Whitworth D Enmanuel Zufall, NP

## 2024-06-16 ENCOUNTER — Encounter (HOSPITAL_COMMUNITY): Payer: Self-pay | Admitting: Radiology

## 2024-06-16 ENCOUNTER — Other Ambulatory Visit: Payer: Self-pay

## 2024-06-16 ENCOUNTER — Ambulatory Visit: Attending: Cardiology | Admitting: Cardiology

## 2024-06-16 ENCOUNTER — Other Ambulatory Visit

## 2024-06-16 ENCOUNTER — Other Ambulatory Visit (HOSPITAL_COMMUNITY): Payer: Self-pay | Admitting: Interventional Radiology

## 2024-06-16 DIAGNOSIS — I482 Chronic atrial fibrillation, unspecified: Secondary | ICD-10-CM

## 2024-06-16 DIAGNOSIS — E785 Hyperlipidemia, unspecified: Secondary | ICD-10-CM

## 2024-06-16 DIAGNOSIS — N1330 Unspecified hydronephrosis: Secondary | ICD-10-CM

## 2024-06-16 DIAGNOSIS — I10 Essential (primary) hypertension: Secondary | ICD-10-CM

## 2024-06-16 DIAGNOSIS — N184 Chronic kidney disease, stage 4 (severe): Secondary | ICD-10-CM

## 2024-06-16 DIAGNOSIS — I25119 Atherosclerotic heart disease of native coronary artery with unspecified angina pectoris: Secondary | ICD-10-CM

## 2024-06-16 DIAGNOSIS — I5032 Chronic diastolic (congestive) heart failure: Secondary | ICD-10-CM

## 2024-06-17 ENCOUNTER — Other Ambulatory Visit: Payer: Self-pay

## 2024-06-17 ENCOUNTER — Other Ambulatory Visit: Payer: Self-pay | Admitting: Hematology

## 2024-06-18 ENCOUNTER — Other Ambulatory Visit: Payer: Self-pay | Admitting: Hematology

## 2024-06-18 ENCOUNTER — Inpatient Hospital Stay (HOSPITAL_COMMUNITY)
Admission: RE | Admit: 2024-06-18 | Discharge: 2024-06-18 | Disposition: A | Discharge status: Home / Self Care | Source: Ambulatory Visit | Attending: Interventional Radiology | Admitting: Interventional Radiology

## 2024-06-18 ENCOUNTER — Telehealth: Payer: Self-pay

## 2024-06-18 ENCOUNTER — Other Ambulatory Visit: Payer: Self-pay

## 2024-06-18 ENCOUNTER — Inpatient Hospital Stay

## 2024-06-18 ENCOUNTER — Encounter (HOSPITAL_COMMUNITY): Payer: Self-pay

## 2024-06-18 DIAGNOSIS — I509 Heart failure, unspecified: Secondary | ICD-10-CM | POA: Diagnosis not present

## 2024-06-18 DIAGNOSIS — M1990 Unspecified osteoarthritis, unspecified site: Secondary | ICD-10-CM | POA: Diagnosis not present

## 2024-06-18 DIAGNOSIS — E1122 Type 2 diabetes mellitus with diabetic chronic kidney disease: Secondary | ICD-10-CM | POA: Diagnosis not present

## 2024-06-18 DIAGNOSIS — I700 Atherosclerosis of aorta: Secondary | ICD-10-CM | POA: Diagnosis not present

## 2024-06-18 DIAGNOSIS — C679 Malignant neoplasm of bladder, unspecified: Secondary | ICD-10-CM | POA: Diagnosis not present

## 2024-06-18 DIAGNOSIS — N183 Chronic kidney disease, stage 3 unspecified: Secondary | ICD-10-CM | POA: Diagnosis not present

## 2024-06-18 DIAGNOSIS — I4819 Other persistent atrial fibrillation: Secondary | ICD-10-CM | POA: Diagnosis not present

## 2024-06-18 DIAGNOSIS — F419 Anxiety disorder, unspecified: Secondary | ICD-10-CM | POA: Diagnosis not present

## 2024-06-18 DIAGNOSIS — I130 Hypertensive heart and chronic kidney disease with heart failure and stage 1 through stage 4 chronic kidney disease, or unspecified chronic kidney disease: Secondary | ICD-10-CM | POA: Diagnosis not present

## 2024-06-18 NOTE — Telephone Encounter (Unsigned)
 Copied from CRM #[id number]. Topic: General - Call Back - No Documentation >> Jun 18, 2024  1:43 PM Rea C wrote: Reason for CRM: Physical Therapist wants to let provider know that patient fell the other night and hurt his left shoulder. Doesn't think there is any major injury.  Physical Therapist talked to the patient about transitioning to hospice care and would like for that to be discussed for tomorrow's appointment as well. Patient has declined these last two or three weeks, per physical therapist.   [phone] Eveline Marker with [facility]

## 2024-06-19 ENCOUNTER — Encounter: Payer: Self-pay | Admitting: Family Medicine

## 2024-06-19 ENCOUNTER — Telehealth: Payer: Self-pay

## 2024-06-19 ENCOUNTER — Ambulatory Visit: Admitting: Family Medicine

## 2024-06-19 ENCOUNTER — Encounter: Payer: Self-pay | Admitting: Hematology

## 2024-06-19 ENCOUNTER — Telehealth: Payer: Self-pay | Admitting: Dietician

## 2024-06-19 VITALS — BP 120/72 | HR 101 | Temp 97.6°F | Ht 71.0 in | Wt 191.8 lb

## 2024-06-19 DIAGNOSIS — E440 Moderate protein-calorie malnutrition: Secondary | ICD-10-CM | POA: Diagnosis not present

## 2024-06-19 DIAGNOSIS — T402X5A Adverse effect of other opioids, initial encounter: Secondary | ICD-10-CM

## 2024-06-19 DIAGNOSIS — K5903 Drug induced constipation: Secondary | ICD-10-CM | POA: Diagnosis not present

## 2024-06-19 DIAGNOSIS — R531 Weakness: Secondary | ICD-10-CM

## 2024-06-19 DIAGNOSIS — T07XXXA Unspecified multiple injuries, initial encounter: Secondary | ICD-10-CM | POA: Diagnosis not present

## 2024-06-19 DIAGNOSIS — C679 Malignant neoplasm of bladder, unspecified: Secondary | ICD-10-CM

## 2024-06-19 DIAGNOSIS — L89323 Pressure ulcer of left buttock, stage 3: Secondary | ICD-10-CM | POA: Diagnosis not present

## 2024-06-19 MED ORDER — LINACLOTIDE 145 MCG PO CAPS: 145.0000 ug | ORAL_CAPSULE | Freq: Every day | ORAL | 3 refills | Status: DC

## 2024-06-19 NOTE — Assessment & Plan Note (Signed)
 Continue [name]  to improve appetite. Weight is up 2 lbs. Will monitor for weight regain.

## 2024-06-19 NOTE — Patient Instructions (Signed)
 Clean with a saline wound spray daily. Use either DuoDerm or a generic hydrocolloid wound dressing.

## 2024-06-19 NOTE — Assessment & Plan Note (Signed)
 [name] has a new pressure ulcer on the buttocks. I recommend his wife clean this daily with a saline wound spray, pat dry,a dn then apply a hydrocolloid wound dressing. I will request home health nursing to assist her in wound management.

## 2024-06-19 NOTE — Telephone Encounter (Signed)
 Patient screened on MST. First attempt to reach. Patient answered but reported he was exhausted from appointment this morning set up a remote nutrition consult for next Tuesday. Prefers afternoon appointment.  Micheline Craven, RDN, LDN Registered Dietitian, [facility] Part Time Remote (Usual office hours: Tuesday-Thursday) Cell: [phone]

## 2024-06-19 NOTE — Assessment & Plan Note (Signed)
 PET scan confirms invasive bladder cancer with significant metastatic disease in lymph nodes of chest and pelvis. Adam Harris is currently engaged with oncology on new chemotherapeutics. Cancer-related pain is being managed with oxycodone . Home PT had suggested consideration for Hospice. However, Adam Harris is still engaged in active treatment of his cancer, which would be a contraindication to Hospice. However, I do feel Adam Harris would benefit from palliative care consultation regarding management of his cancer-related symptoms.

## 2024-06-19 NOTE — Telephone Encounter (Signed)
 DME form for hospital bed and walker has been faxed to [employer] @ [phone].  Patient made aware. Dm/cma

## 2024-06-19 NOTE — Progress Notes (Signed)
 [facility] PRIMARY CARE [facility] [address] [place] [address] Dept: [phone] Dept Fax: [phone]  Office Visit  Subjective:    Patient ID: Magdalene Arnt, male    DOB: 29-Nov-1945, 78 y.o..   MRN: [id number]  Chief Complaint  Patient presents with   Follow-up    F/u weight loss and fell on his LT arm x 2.     History of Present Illness:  Patient is in today for having experienced multiple falls over the past few weeks. Mr. 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 He is working with oncology regarding this. However, he is experiencing pelvic and abdominal pain, significant weight loss, and progressive weakness and unsteadiness. He notes he had several falls where he landed on his buttocks. As well he has had some falls landing on either side and on his face. He has not had any loss of consciousness. His son was able to help him get up. He did not seek any urgent evaluation. He has noted some pain in his upper arms and shoulders and his hip areas. He denies any pain in the coccygeal area. He has also experienced some low back pain. He was recently prescribed oxycodone  from his oncologist for management of cancer-related pain, which has helped with some of these pain issues.However, as a side effect, he is having signifcant constipation. he has not had a bowel movement in about 8 days.  Mr. Ruscitti has experienced 38 lbs of weight loss over the past 9 months. His oncologist has prescribed [name]  for his appetite. He is trying to push himself to eat more.  Mr. Turman does mention he has developed a sore on his buttocks. 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) 06/04/2024    Generalized weakness 05/25/2024   Complicated UTI (urinary tract infection) 05/25/2024   Type 2 diabetes mellitus with hyperglycemia (HCC) 05/25/2024   Nephrostomy present (HCC) 04/01/2024   AKI (acute kidney injury) (HCC) 03/08/2024   Tachyarrhythmia 03/07/2024   Hematuria 03/07/2024   Acute blood loss anemia 03/07/2024   Dysuria 01/25/2024   Presence of Watchman left atrial appendage closure device 12/13/2023   Permanent atrial fibrillation (HCC) 11/29/2023   Hx of hematuria 11/29/2023   Lower urinary tract symptoms (LUTS) 04/06/2023   CKD (chronic kidney disease), stage IV (HCC) 03/22/2023   Prostate cancer (HCC) 06/21/2022   Cataract cortical, senile, left 12/13/2021   Diabetic peripheral neuropathy (HCC) 12/13/2021   Hydrocele- Left 12/02/2021   Solitary kidney, acquired 10/26/2021   Hypertensive renal disease 10/26/2021   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   Chronic diastolic CHF (congestive heart failure), NYHA class 2 (HCC) 04/02/2017   Atrial fibrillation with RVR (HCC) 11/20/2016   OA (osteoarthritis) of knee 05/31/2016   Low back pain 05/31/2016   Exertional angina (HCC) 10/29/2014   Coronary artery disease involving native coronary artery of native heart with angina pectoris (HCC) 03/09/2014   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/2014   Hyperlipidemia 02/05/2014   History of non-ST elevation myocardial infarction (NSTEMI) 02/03/2014   Essential hypertension 02/03/2014   Anxiety state 04/19/2013   History of  prostate cancer 04/19/2013   Insomnia 04/19/2013   Stiffness of joint, lower leg 04/19/2013   Sciatica 04/19/2013   Past Surgical History:  Procedure Laterality Date   CARDIAC CATHETERIZATION N/A 11/21/2016   Procedure: Left Heart Cath and Coronary Angiography;  Surgeon: Debby DELENA Sor, MD;  Location: [facility] INVASIVE CV LAB;  Service:  Cardiovascular;  Laterality: N/A;  pRCA 100% , ostial LAD 45%, OM3 80%, mCFX to dCFX 100% (AV groove), lateral OM3 50%   COLONOSCOPY     CYSTOSCOPY WITH BIOPSY N/A 08/12/2020   Procedure: CYSTOSCOPY WITH BLADDER BIOPSY AND TRANSURETHRAL RESECTION OF BLADDER TUMOR;  Surgeon: [name], MD;  Location: [facility];  Service: Urology;  Laterality: N/A;   CYSTOSCOPY WITH BIOPSY N/A 11/10/2021   Procedure: CYSTOSCOPY WITH BLADDER BIOPSIES/ LEFT RETROGRADE;  Surgeon: [name], MD;  Location: [facility];  Service: Urology;  Laterality: N/A;   CYSTOSCOPY WITH BIOPSY Left 07/27/2022   Procedure: CYSTOSCOPY WITH  BLADDER BIOPSY, TRANSURETHRAL FULGERATION OF BLADDER, EXAM UNDER ANESTHESIA, LEFT RETROGRADE PYELOGRAM;  Surgeon: [name], MD;  Location: [facility];  Service: Urology;  Laterality: Left;   CYSTOSCOPY WITH BIOPSY N/A 04/23/2023   Procedure: CYSTOSCOPY WITH BLADDER AND PROSTATIC URETHRAL BIOPSIES;  Surgeon: [name], MD;  Location: [facility];  Service: Urology;  Laterality: N/A;  60 MINUTES NEEDED FOR CASE   CYSTOSCOPY WITH BIOPSY N/A 03/06/2024   Procedure: CYSTOSCOPY, WITH BIOPSY;  Surgeon: [name], MD;  Location: [facility];  Service: Urology;  Laterality: N/A;  CYSTOSCOPY WITH BLADDER BIOPSIES   CYSTOSCOPY WITH FULGERATION N/A 03/06/2024   Procedure: CYSTOSCOPY, WITH BLADDER FULGURATION, TURBT;  Surgeon: [name], MD;  Location: [facility];  Service: Urology;  Laterality: N/A;   CYSTOSCOPY WITH URETEROSCOPY AND STENT PLACEMENT Right 12/15/2019   Procedure: CYSTOSCOPY WITH RIGHT RETROGRADE/ RIGHT URETEROSCOPY/ BIOPSY;  Surgeon: Ottelin, Mark, MD;  Location[facility];  Service: Urology;  Laterality: Right;   CYSTOSCOPY/RETROGRADE/URETEROSCOPY Bilateral 03/18/2018   Procedure: CYSTOSCOPY/RETROGRADE/URETEROSCOPY.;  Surgeon: Ottelin, Mark, MD;  Location: [facility];  Service: Urology;  Laterality: Bilateral;   EYE SURGERY Bilateral    cateract in January 2023    HYDROCELE EXCISION Left 07/27/2022   Procedure: HYDROCELE REPAIR;  Surgeon: [name], MD;  Location: [facility];  Service: Urology;  Laterality: Left;  GENERAL ANESTHESIA WITH PARALYSIS   IR FLUORO PROCEDURE UNLISTED  06/13/2024   IR NEPHROSTOMY EXCHANGE LEFT  03/10/2024   IR NEPHROSTOMY EXCHANGE LEFT  04/28/2024   IR NEPHROSTOMY PLACEMENT LEFT  03/07/2024   KNEE ARTHROSCOPY Right    LEFT ATRIAL APPENDAGE OCCLUSION N/A 12/13/2023   Procedure: LEFT ATRIAL APPENDAGE OCCLUSION;  Surgeon: [name], MD;  Location: [facility] INVASIVE CV LAB;  Service: Cardiovascular;  Laterality: N/A;   LEFT HEART CATHETERIZATION WITH CORONARY ANGIOGRAM N/A 02/04/2014   Procedure: LEFT HEART CATHETERIZATION WITH CORONARY ANGIOGRAM;  Surgeon: Deatrice DELENA Cage, MD;  Location: [facility] CATH LAB;  Service: Cardiovascular;  Laterality: N/A;  severe one-vessel CAD, chronically occluded RCA with faint left-to-right collaterals;  aneurysmal LCFx with sluggish coronary flow;  normal LVSF w/ moderately elevated LVEDP (ostialOM2 20%, pOM3 20%, pD1 20%, mCFX 50%, diffuse 20% pCFX)   PROSTATE BIOPSY N/A 08/12/2020   Procedure: BIOPSY TRANSRECTAL ULTRASONIC PROSTATE (TUBP);  Surgeon: [name], MD;  Location: [facility];  Service: Urology;  Laterality: N/A;   ROBOT ASSITED LAPAROSCOPIC NEPHROURETERECTOMY Right 03/22/2020   Procedure: XI ROBOT ASSITED LAPAROSCOPIC NEPHROURETERECTOMY;  Surgeon: [name], MD;  Location: [facility];  Service: Urology;  Laterality:  Right;   TRANSESOPHAGEAL ECHOCARDIOGRAM (CATH LAB) N/A 12/13/2023   Procedure: TRANSESOPHAGEAL ECHOCARDIOGRAM;  Surgeon: [name], MD;  Location: [facility] INVASIVE CV LAB;  Service: Cardiovascular;  Laterality: N/A;   TRANSESOPHAGEAL ECHOCARDIOGRAM (CATH LAB) N/A 02/11/2024   Procedure: TRANSESOPHAGEAL ECHOCARDIOGRAM;  Surgeon: Barbaraann Darryle Ned, MD;  Location: [facility] INVASIVE CV LAB;  Service: Cardiovascular;  Laterality: N/A;   TRANSTHORACIC ECHOCARDIOGRAM  11-22-2016   dr hochrein    ef 55-60%/ mild MR and TR/ moderate LAE   TRANSURETHRAL RESECTION OF BLADDER TUMOR N/A 02/10/2021   Procedure: TRANSURETHRAL RESECTION OF BLADDER TUMOR (TURBT)/ CYSTOSCOPY/ LEFT RETROGRADE;  Surgeon: [name], MD;  Location: [facility];  Service: Urology;  Laterality: N/A;  GENERAL ANESTHESIA WITH PARALYSIS   Family History  Problem Relation Age of Onset   Hypertension Mother    Cancer Mother        anal cancer   Esophageal cancer Neg Hx    Pancreatic cancer Neg Hx    Stomach cancer Neg Hx    Liver disease Neg Hx    Outpatient Medications Prior to Visit  Medication Sig Dispense Refill   aspirin  EC 81 MG tablet Take 1 tablet (81 mg total) by mouth daily. Swallow whole. 30 tablet 12   Carboxymethylcell-Glycerin  PF 0.5-0.9 % SOLN Place 2 drops into both eyes 4 (four) times daily. 1 each 11   hydrOXYzine  (VISTARIL ) 25 MG capsule Take 1 capsule (25 mg total) by mouth every 8 (eight) hours as needed. 30 capsule 2   isosorbide  mononitrate (IMDUR ) 30 MG 24 hr tablet Take 1 tablet (30 mg total) by mouth daily.     lidocaine -prilocaine  (EMLA ) cream Apply to affected area once 30 g 3   megestrol  ([name]  ES) 625 MG/5ML suspension Take 5 mLs (625 mg total) by mouth daily. 150 mL 0   metoprolol  tartrate (LOPRESSOR ) 25 MG tablet TAKE ONE TABLET BY MOUTH TWICE A DAY 180 tablet 2   mirtazapine  (REMERON ) 30 MG tablet Take 1 tablet (30 mg total) by mouth at bedtime. 30 tablet 0   nitroGLYCERIN  (NITROSTAT ) 0.4 MG SL tablet TAKE ONE TABLET UNDER THE TONGUE EVERY 5 MINS FOR THREE DOSES AS NEEDED FOR CHEST PAIN. CALL 911 IF 2ND DOSE DOESN'T HELP 25 tablet 11   ondansetron  (ZOFRAN ) 8 MG tablet Take 1 tablet (8 mg total) by mouth every 8 (eight) hours as needed for nausea or vomiting. 20 tablet 0   ondansetron  (ZOFRAN ) 8 MG tablet Take 1 tablet (8 mg total) by mouth every 8 (eight) hours as needed for nausea or vomiting. 30 tablet 1   oxyCODONE  (OXY IR/ROXICODONE ) 5 MG immediate release tablet Take 1-2  tablets (5-10 mg total) by mouth every 4 (four) hours as needed for severe pain (pain score 7-10). 60 tablet 0   Pembrolizumab  (KEYTRUDA  IV) Inject into the vein every 14 (fourteen) days.     polyethylene glycol (MIRALAX  / GLYCOLAX ) 17 g packet Take 17 g by mouth daily as needed for mild constipation. 14 each 0   prochlorperazine  (COMPAZINE ) 10 MG tablet Take 1 tablet (10 mg total) by mouth every 6 (six) hours as needed for nausea or vomiting. 30 tablet 1   rosuvastatin  (CRESTOR ) 40 MG tablet TAKE 1 TABLET (40 MG TOTAL) BY MOUTH EVERY MORNING. PLEASE KEEP SCHEDULED APPOINTMENT 90 tablet 3   senna-docusate (SENOKOT-S) 8.6-50 MG tablet Take 1 tablet by mouth 2 (two) times daily.     sitaGLIPtin  (JANUVIA ) 50 MG tablet Take 1 tablet (50 mg total) by mouth daily.  90 tablet 3   sodium bicarbonate  650 MG tablet Take 650 mg by mouth 2 (two) times daily.     tamsulosin  (FLOMAX ) 0.4 MG CAPS capsule TAKE ONE CAPSULE BY MOUTH DAILY 30 capsule 11   traZODone  (DESYREL ) 50 MG tablet Take 0.5-1 tablets (25-50 mg total) by mouth at bedtime as needed for sleep. 30 tablet 3   No facility-administered medications prior to visit.   Allergies  Allergen Reactions   Xarelto  [Rivaroxaban ] Other (See Comments)    Skin Blisters      Objective:   Today's Vitals   06/19/24 1056  BP: 120/72  Pulse: (!) 101  Temp: 97.6 F (36.4 C)  TempSrc: Temporal  SpO2: 98%  Weight: 191 lb 12.8 oz (87 kg)  Height: 5' 11 (1.803 m)   Body mass index is 26.75 kg/m.   General: Thin and showing signs of motor wasting. No acute distress. HEENT: Bruise with abrasion noted on right cheek.  Back: No pain with palpation along the lumbar spine. Buttocks: There is a ~ 4 cm shallow ulceration over the left buttocks. There is mild exudate, but no sign of   infection or surrounding erythema. Extremities: Full ROM. No pain on palpation over the upper and lower extremities. Hip motion is without pain.   No joint swelling or tenderness.  Patient is visibly weak with standing. Psych: Alert and oriented. Normal mood and affect.  Health Maintenance Due  Topic Date Due   Diabetic kidney evaluation - Urine ACR  Never done   Zoster Vaccines- Shingrix (1 of 2) Never done   OPHTHALMOLOGY EXAM  05/18/2022   INFLUENZA VACCINE  06/13/2024   Medicare Annual Wellness (AWV)  08/05/2024   Imaging: PET Scan (06/13/2024) IMPRESSION: 1. Extensive hypermetabolic retroperitoneal and pelvic adenopathy consistent with metastatic disease. 2. Hypermetabolic left supraclavicular and subclavicular adenopathy consistent with metastatic disease. 3. No discrete hypermetabolic bladder lesion is identified. 4. Numerous small scattered calcified and noncalcified pulmonary nodules are indeterminate finding but appears stable since a prior CT scan from 07/31/2023 suggesting a benign process. Attention on future studies is suggested. 5. Borderline enlarged mediastinal and hilar nodes demonstrating low level hypermetabolism. These are stable in size since a CT scan from 07/31/2023 and are likely benign/reactive. 6. Aortic atherosclerosis.  Lab Results    Latest Ref Rng & Units 06/12/2024    3:17 PM 06/02/2024    1:01 PM 06/01/2024    5:58 AM  CBC  WBC 4.0 - 10.5 K/uL 17.5  15.0  15.1   Hemoglobin 13.0 - 17.0 g/dL 88.2  87.9  89.0   Hematocrit 39.0 - 52.0 % 36.7  40.0  35.6   Platelets 150 - 400 K/uL 372  309  266        Latest Ref Rng & Units 06/12/2024    3:17 PM 06/02/2024    1:01 PM 06/01/2024    5:58 AM  CMP  Glucose 70 - 99 mg/dL 818  765  870   BUN 8 - 23 mg/dL 27  31  29    Creatinine 0.61 - 1.24 mg/dL 8.25  7.65  7.69   Sodium 135 - 145 mmol/L 132  134  133   Potassium 3.5 - 5.1 mmol/L 4.7  4.3  3.9   Chloride 98 - 111 mmol/L 102  99  99   CO2 22 - 32 mmol/L 23  22  25    Calcium  8.9 - 10.3 mg/dL 9.0  8.8  8.5   Total Protein 6.5 - 8.1  g/dL 8.2     Total Bilirubin 0.0 - 1.2 mg/dL 1.6     Alkaline Phos 38 - 126 U/L 181     AST 15 - 41  U/L 26     ALT 0 - 44 U/L 23      Assessment & Plan:   Problem List Items Addressed This Visit       Digestive   Opioid-induced constipation   I will add linactolide (Linzess ) for management of constipation.       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 He is currently engaged with oncology on new chemotherapeutics. Cancer-related pain is being managed with oxycodone . Home PT had suggested consideration for Hospice. However, Mr. glymph is still engaged in active treatment of his cancer, which would be a contraindication to Hospice. However, I do feel he would benefit from palliative care consultation regarding management of his cancer-related symptoms.      Relevant Orders   DME Walker platform   For home use only DME Hospital bed   Ambulatory referral to Home Health   Amb Referral to Palliative Care     Other   Generalized weakness   I will write orders for a walker to assist with ambulation and hopefully reduce some of his falls. Additionally, I will request a hospital bed that he can place downstairs in his home. This will help with management of his pressure ulcer and the need for more frequent positional changes.       Relevant Orders   DME Walker platform   For home use only DME Hospital bed   Ambulatory referral to Home Health   Amb Referral to Palliative Care   Moderate protein-calorie malnutrition (HCC)   Continue [name]  to improve appetite. Weight is up 2 lbs. Will monitor for weight regain.      Relevant Orders   DME Walker platform   For home use only DME Hospital bed   Ambulatory referral to Home Health   Amb Referral to Palliative Care   Pressure injury of left buttock, stage 3 [facility])   Mr. Loadholt has a new pressure ulcer on the buttocks. I  recommend his wife clean this daily with a saline wound spray, pat dry,a dn then apply a hydrocolloid wound dressing. I will request home health nursing to assist her in wound management.       Relevant Orders   For home use only DME Hospital bed   Ambulatory referral to Home Health   Amb Referral to Palliative Care   Other Visit Diagnoses       Multiple contusions       No evidence of fractures, so will hold off on x-rays at this poin.\t. Manage expectantly.      I personally spent a total of 65 minutes in the care of the patient today including preparing to see the patient, performing a medically appropriate exam/evaluation, counseling and educating, placing orders, referring and communicating with other health care professionals, documenting clinical information in the EHR, independently interpreting results, and coordinating care.   Return in about 4 weeks (around 07/17/2024) for Reassessment.   Garnette CHRISTELLA Simpler, MD

## 2024-06-19 NOTE — Assessment & Plan Note (Signed)
 I will add linactolide (Linzess ) for management of constipation.

## 2024-06-19 NOTE — Telephone Encounter (Signed)
 The patient was scheduled with Cardiology on 06/16/2024 an no-showed. Called patient to check on him. Left message to call back.

## 2024-06-19 NOTE — Assessment & Plan Note (Signed)
 I will write orders for a walker to assist with ambulation and hopefully reduce some of his falls. Additionally, I will request a hospital bed that he can place downstairs in his home. This will help with management of his pressure ulcer and the need for more frequent positional changes.

## 2024-06-20 ENCOUNTER — Telehealth: Payer: Self-pay | Admitting: Hematology

## 2024-06-20 ENCOUNTER — Other Ambulatory Visit (HOSPITAL_COMMUNITY)

## 2024-06-20 DIAGNOSIS — C778 Secondary and unspecified malignant neoplasm of lymph nodes of multiple regions: Secondary | ICD-10-CM | POA: Diagnosis not present

## 2024-06-20 DIAGNOSIS — C678 Malignant neoplasm of overlapping sites of bladder: Secondary | ICD-10-CM | POA: Diagnosis not present

## 2024-06-20 NOTE — Telephone Encounter (Signed)
 Received voicemail from the patient he is doing well.  Will call Monday to confirm medications.

## 2024-06-20 NOTE — Telephone Encounter (Signed)
 Scheduled appointments per WQ. Talked with the patient and he is aware of the made appointments.

## 2024-06-23 ENCOUNTER — Telehealth: Payer: Self-pay

## 2024-06-23 NOTE — Progress Notes (Signed)
 Complex Care Management Note  Care Guide Note 06/23/2024 Name: Adam Harris MRN: [id number] DOB: [birth year]  Adam Harris is a 78 y.o. year old male who sees Rudd, Garnette HERO, MD for primary care. I reached out to Magdalene Arnt by phone today to offer complex care management services.  [name] was given information about Complex Care Management services today including:   The Complex Care Management services include support from the care team which includes your Nurse Care Manager, Clinical Social Worker, or Pharmacist.  The Complex Care Management team is here to help remove barriers to the health concerns and goals most important to you. Complex Care Management services are voluntary, and the patient may decline or stop services at any time by request to their care team member.   Complex Care Management Consent Status: Patient agreed to services and verbal consent obtained.   Follow up plan:  Telephone appointment with complex care management team member scheduled for:  06/30/24 at 3:00 p.m.   Encounter Outcome:  Patient Scheduled  Dreama [facility]  [facility], [facility] Care Management Assistant Direct Dial: [phone]  Fax: [phone]

## 2024-06-24 ENCOUNTER — Other Ambulatory Visit (HOSPITAL_COMMUNITY)

## 2024-06-24 ENCOUNTER — Telehealth: Payer: Self-pay

## 2024-06-24 ENCOUNTER — Inpatient Hospital Stay: Admitting: Dietician

## 2024-06-24 ENCOUNTER — Ambulatory Visit: Payer: Self-pay

## 2024-06-24 DIAGNOSIS — Z7982 Long term (current) use of aspirin: Secondary | ICD-10-CM | POA: Insufficient documentation

## 2024-06-24 DIAGNOSIS — E46 Unspecified protein-calorie malnutrition: Secondary | ICD-10-CM | POA: Insufficient documentation

## 2024-06-24 DIAGNOSIS — Z79899 Other long term (current) drug therapy: Secondary | ICD-10-CM | POA: Insufficient documentation

## 2024-06-24 DIAGNOSIS — C679 Malignant neoplasm of bladder, unspecified: Secondary | ICD-10-CM | POA: Insufficient documentation

## 2024-06-24 DIAGNOSIS — G893 Neoplasm related pain (acute) (chronic): Secondary | ICD-10-CM | POA: Insufficient documentation

## 2024-06-24 DIAGNOSIS — Z5112 Encounter for antineoplastic immunotherapy: Secondary | ICD-10-CM | POA: Insufficient documentation

## 2024-06-24 DIAGNOSIS — C775 Secondary and unspecified malignant neoplasm of intrapelvic lymph nodes: Secondary | ICD-10-CM | POA: Insufficient documentation

## 2024-06-24 DIAGNOSIS — Z7901 Long term (current) use of anticoagulants: Secondary | ICD-10-CM | POA: Insufficient documentation

## 2024-06-24 DIAGNOSIS — E1122 Type 2 diabetes mellitus with diabetic chronic kidney disease: Secondary | ICD-10-CM | POA: Insufficient documentation

## 2024-06-24 DIAGNOSIS — I509 Heart failure, unspecified: Secondary | ICD-10-CM | POA: Insufficient documentation

## 2024-06-24 DIAGNOSIS — I130 Hypertensive heart and chronic kidney disease with heart failure and stage 1 through stage 4 chronic kidney disease, or unspecified chronic kidney disease: Secondary | ICD-10-CM | POA: Insufficient documentation

## 2024-06-24 DIAGNOSIS — Z7984 Long term (current) use of oral hypoglycemic drugs: Secondary | ICD-10-CM | POA: Insufficient documentation

## 2024-06-24 DIAGNOSIS — N183 Chronic kidney disease, stage 3 unspecified: Secondary | ICD-10-CM | POA: Insufficient documentation

## 2024-06-24 NOTE — Telephone Encounter (Signed)
 Will wait to get form from [facility], Dm/cma

## 2024-06-24 NOTE — Telephone Encounter (Signed)
 Copied from CRM [id number]. Topic: General - Other >> Jun 24, 2024  2:00 PM Paige D wrote: Reason for CRM: [facility] Physical therapist Pt has been refusing physical therapy visits today he agreed to be discharged from home health service. Transferred to [facility] they asked if therapist could fax all that over he is going to fax it

## 2024-06-24 NOTE — Progress Notes (Signed)
 Nutrition Assessment   Reason for Assessment: MST screen for weight loss.    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 He is being treated with Keytruda  q 21 days.  He is followed b Dr. Lanny who has also prescribed [name]  and Mirtazapine  for his appetite. He also has a stage 3 PU, DM 2, CKD, HTN, CHF, and memory concerns.  Wife on call with patient and did most of the reporting.  Daughter also involve who has purchase but the Premier protein shakes and Serious Mass weight gain powder (1250 calories, 50 g protein).  Daughter relayed patient doesn't really like the CIB drinks.  Spouse reports some improvement since starting the appetite stimulants.  PO lately:Breakfast: Grits  Supper:  Cook out burger with onion rings V-8 Vegetable juice.  Spouse state she can add extra butter to grits but not cheese for extra protein, might be willing to eat sausage.     Anthropometrics:  weight loss started in April down 30.6# past 4 months  Height: 71 Weight: 191.7# UBW: 220# BMI: 26.75    NUTRITION DIAGNOSIS: Inadequate PO intake to meet increased nutrient needs with cancer and pressure ulcer, r/t anorexia  MALNUTRITION DIAGNOSIS: suspect acute malnutrition with significant weigh loss   INTERVENTION:  Relayed that nutrition services are wrap around service provided at no charge and encouraged continued communication if experiencing continued weight loss or any nutritional impact symptoms (NIS). Educated on importance of adequate calorie and protein energy intake  with nutrient dense foods when possible to maintain weight/strength   Encouraged more frequent feeds and trying to eat 3-4 small meals with ONS BID Suggested 1/2 scoop Serious Weight gain powder (625 calories, 25 g pro, 125 carbs) and 1 premier protein shake (30 g pro) daily for wound healing Emailed reminder to add MVI and Vit C 1000mg  for wound  healing with contact information to wife's email  [email or web address]   MONITORING, EVALUATION, GOAL: weight, PO intake, Nutrition Impact Symptoms, labs Goal is weight maintenance  Next Visit: remote next month Micheline Craven, RDN, LDN Registered Dietitian, [facility] Part Time Remote (Usual office hours: Tuesday-Thursday) Cell: [phone]

## 2024-06-24 NOTE — Telephone Encounter (Signed)
 FYI Only or Action Required?: Action required by provider: clinical question for provider.  Patient was last seen in primary care on 06/19/2024 by [name], MD.  Called Nurse Triage reporting Memory Loss.  Symptoms began several weeks ago.  Interventions attempted: Rest, hydration, or home remedies.  Symptoms are: unchanged.  Triage Disposition: See PCP Within 2 Weeks  Patient/caregiver understands and will follow disposition?: No, wishes to speak with PCP  Copied from CRM #[id number]. Topic: Clinical - Red Word Triage >> Jun 24, 2024 12:54 PM Deleta RAMAN wrote: Red Word that prompted transfer to Nurse Triage: patient is dealing with confusion and memory loss that is causing frustration Reason for Disposition  [1] Longstanding confusion (e.g., dementia, stroke) AND [2] NO worsening or change  Answer Assessment - Initial Assessment Questions 1. LEVEL OF CONSCIOUSNESS: How are they (the patient) acting right now? (e.g., alert-oriented, confused, lethargic, stuporous, comatose)     Confused at times-wife states confused in the morning and at night 2. ONSET: When did the confusion start?  (e.g., minutes, hours, days)     Started a couple of months ago 3. PATTERN: Does this come and go, or has it been constant since it started?  Is it present now?     Comes and goes 4. ALCOHOL  or DRUGS: Have they been drinking alcohol  or taking any drugs?      no 5. NARCOTIC MEDICINES: Have they been receiving any narcotic medications? (e.g., morphine , Vicodin)     Oxycodone  for pain 6. CAUSE: What do you think is causing the confusion?      Currently getting cancer treatments. Recently in the hospital from 05/24/2024-06/03/2024 7. OTHER SYMPTOMS: Are there any other symptoms? (e.g., difficulty breathing, fever, headache, weakness)     Weakness  Wife called asking if there was any medication that could be written for patient to help with anxiety. Wife states patient is getting  frustrated with his memory loss and getting upset that he can't remember certain phone numbers or different details of their life. Discussed with wife if any phone calls had been received from home health or palliative care. Wife states any phone call is going to the patient's phone. States she will have to look. Wife is hoping for some medication to help with anxiety. Would like a phone call back.  Protocols used: Confusion - Delirium-A-AH

## 2024-06-25 ENCOUNTER — Inpatient Hospital Stay: Admitting: Hematology

## 2024-06-25 ENCOUNTER — Inpatient Hospital Stay

## 2024-06-25 ENCOUNTER — Encounter: Payer: Self-pay | Admitting: Hematology

## 2024-06-25 VITALS — BP 142/82 | HR 80 | Temp 97.0°F | Resp 18 | Ht 71.0 in | Wt 198.6 lb

## 2024-06-25 DIAGNOSIS — C61 Malignant neoplasm of prostate: Secondary | ICD-10-CM | POA: Diagnosis present

## 2024-06-25 DIAGNOSIS — Z452 Encounter for adjustment and management of vascular access device: Secondary | ICD-10-CM | POA: Diagnosis not present

## 2024-06-25 DIAGNOSIS — R0600 Dyspnea, unspecified: Secondary | ICD-10-CM | POA: Diagnosis not present

## 2024-06-25 DIAGNOSIS — C775 Secondary and unspecified malignant neoplasm of intrapelvic lymph nodes: Secondary | ICD-10-CM | POA: Diagnosis not present

## 2024-06-25 DIAGNOSIS — I5032 Chronic diastolic (congestive) heart failure: Secondary | ICD-10-CM | POA: Diagnosis present

## 2024-06-25 DIAGNOSIS — Z515 Encounter for palliative care: Secondary | ICD-10-CM | POA: Diagnosis not present

## 2024-06-25 DIAGNOSIS — I4821 Permanent atrial fibrillation: Secondary | ICD-10-CM | POA: Diagnosis present

## 2024-06-25 DIAGNOSIS — W19XXXA Unspecified fall, initial encounter: Secondary | ICD-10-CM | POA: Diagnosis not present

## 2024-06-25 DIAGNOSIS — R6510 Systemic inflammatory response syndrome (SIRS) of non-infectious origin without acute organ dysfunction: Secondary | ICD-10-CM | POA: Diagnosis not present

## 2024-06-25 DIAGNOSIS — M5030 Other cervical disc degeneration, unspecified cervical region: Secondary | ICD-10-CM | POA: Diagnosis not present

## 2024-06-25 DIAGNOSIS — Y838 Other surgical procedures as the cause of abnormal reaction of the patient, or of later complication, without mention of misadventure at the time of the procedure: Secondary | ICD-10-CM | POA: Diagnosis present

## 2024-06-25 DIAGNOSIS — R7881 Bacteremia: Secondary | ICD-10-CM | POA: Diagnosis not present

## 2024-06-25 DIAGNOSIS — Y9301 Activity, walking, marching and hiking: Secondary | ICD-10-CM | POA: Diagnosis present

## 2024-06-25 DIAGNOSIS — E1122 Type 2 diabetes mellitus with diabetic chronic kidney disease: Secondary | ICD-10-CM | POA: Diagnosis present

## 2024-06-25 DIAGNOSIS — M4802 Spinal stenosis, cervical region: Secondary | ICD-10-CM | POA: Diagnosis not present

## 2024-06-25 DIAGNOSIS — I110 Hypertensive heart disease with heart failure: Secondary | ICD-10-CM | POA: Diagnosis not present

## 2024-06-25 DIAGNOSIS — C679 Malignant neoplasm of bladder, unspecified: Secondary | ICD-10-CM

## 2024-06-25 DIAGNOSIS — R509 Fever, unspecified: Secondary | ICD-10-CM | POA: Diagnosis not present

## 2024-06-25 DIAGNOSIS — Z66 Do not resuscitate: Secondary | ICD-10-CM | POA: Diagnosis not present

## 2024-06-25 DIAGNOSIS — N183 Chronic kidney disease, stage 3 unspecified: Secondary | ICD-10-CM | POA: Diagnosis not present

## 2024-06-25 DIAGNOSIS — G893 Neoplasm related pain (acute) (chronic): Secondary | ICD-10-CM | POA: Diagnosis not present

## 2024-06-25 DIAGNOSIS — I25119 Atherosclerotic heart disease of native coronary artery with unspecified angina pectoris: Secondary | ICD-10-CM | POA: Diagnosis not present

## 2024-06-25 DIAGNOSIS — I10 Essential (primary) hypertension: Secondary | ICD-10-CM | POA: Diagnosis not present

## 2024-06-25 DIAGNOSIS — J440 Chronic obstructive pulmonary disease with acute lower respiratory infection: Secondary | ICD-10-CM | POA: Diagnosis present

## 2024-06-25 DIAGNOSIS — I071 Rheumatic tricuspid insufficiency: Secondary | ICD-10-CM | POA: Diagnosis present

## 2024-06-25 DIAGNOSIS — T83512A Infection and inflammatory reaction due to nephrostomy catheter, initial encounter: Secondary | ICD-10-CM | POA: Diagnosis present

## 2024-06-25 DIAGNOSIS — W19XXXS Unspecified fall, sequela: Secondary | ICD-10-CM | POA: Diagnosis not present

## 2024-06-25 DIAGNOSIS — K760 Fatty (change of) liver, not elsewhere classified: Secondary | ICD-10-CM | POA: Diagnosis present

## 2024-06-25 DIAGNOSIS — E782 Mixed hyperlipidemia: Secondary | ICD-10-CM | POA: Diagnosis not present

## 2024-06-25 DIAGNOSIS — N1832 Chronic kidney disease, stage 3b: Secondary | ICD-10-CM | POA: Diagnosis not present

## 2024-06-25 DIAGNOSIS — N390 Urinary tract infection, site not specified: Secondary | ICD-10-CM | POA: Diagnosis not present

## 2024-06-25 DIAGNOSIS — D72829 Elevated white blood cell count, unspecified: Secondary | ICD-10-CM | POA: Diagnosis not present

## 2024-06-25 DIAGNOSIS — R531 Weakness: Secondary | ICD-10-CM | POA: Diagnosis not present

## 2024-06-25 DIAGNOSIS — E440 Moderate protein-calorie malnutrition: Secondary | ICD-10-CM | POA: Diagnosis present

## 2024-06-25 DIAGNOSIS — R918 Other nonspecific abnormal finding of lung field: Secondary | ICD-10-CM | POA: Diagnosis not present

## 2024-06-25 DIAGNOSIS — N10 Acute tubulo-interstitial nephritis: Secondary | ICD-10-CM | POA: Diagnosis not present

## 2024-06-25 DIAGNOSIS — D509 Iron deficiency anemia, unspecified: Secondary | ICD-10-CM | POA: Diagnosis present

## 2024-06-25 DIAGNOSIS — Z043 Encounter for examination and observation following other accident: Secondary | ICD-10-CM | POA: Diagnosis not present

## 2024-06-25 DIAGNOSIS — W1830XA Fall on same level, unspecified, initial encounter: Secondary | ICD-10-CM | POA: Diagnosis present

## 2024-06-25 DIAGNOSIS — Z8616 Personal history of COVID-19: Secondary | ICD-10-CM | POA: Diagnosis not present

## 2024-06-25 DIAGNOSIS — A419 Sepsis, unspecified organism: Secondary | ICD-10-CM | POA: Diagnosis not present

## 2024-06-25 DIAGNOSIS — I472 Ventricular tachycardia, unspecified: Secondary | ICD-10-CM | POA: Diagnosis not present

## 2024-06-25 DIAGNOSIS — R6520 Severe sepsis without septic shock: Secondary | ICD-10-CM | POA: Diagnosis present

## 2024-06-25 DIAGNOSIS — I2721 Secondary pulmonary arterial hypertension: Secondary | ICD-10-CM | POA: Diagnosis present

## 2024-06-25 DIAGNOSIS — R410 Disorientation, unspecified: Secondary | ICD-10-CM | POA: Diagnosis not present

## 2024-06-25 DIAGNOSIS — L89323 Pressure ulcer of left buttock, stage 3: Secondary | ICD-10-CM | POA: Diagnosis present

## 2024-06-25 DIAGNOSIS — Z936 Other artificial openings of urinary tract status: Secondary | ICD-10-CM | POA: Diagnosis not present

## 2024-06-25 DIAGNOSIS — I700 Atherosclerosis of aorta: Secondary | ICD-10-CM | POA: Diagnosis present

## 2024-06-25 DIAGNOSIS — G9341 Metabolic encephalopathy: Secondary | ICD-10-CM | POA: Diagnosis present

## 2024-06-25 DIAGNOSIS — C689 Malignant neoplasm of urinary organ, unspecified: Secondary | ICD-10-CM | POA: Diagnosis not present

## 2024-06-25 DIAGNOSIS — A4102 Sepsis due to Methicillin resistant Staphylococcus aureus: Secondary | ICD-10-CM | POA: Diagnosis present

## 2024-06-25 DIAGNOSIS — J189 Pneumonia, unspecified organism: Secondary | ICD-10-CM | POA: Diagnosis present

## 2024-06-25 DIAGNOSIS — E874 Mixed disorder of acid-base balance: Secondary | ICD-10-CM | POA: Diagnosis not present

## 2024-06-25 DIAGNOSIS — B9562 Methicillin resistant Staphylococcus aureus infection as the cause of diseases classified elsewhere: Secondary | ICD-10-CM | POA: Diagnosis not present

## 2024-06-25 DIAGNOSIS — E1165 Type 2 diabetes mellitus with hyperglycemia: Secondary | ICD-10-CM | POA: Diagnosis not present

## 2024-06-25 DIAGNOSIS — N184 Chronic kidney disease, stage 4 (severe): Secondary | ICD-10-CM | POA: Diagnosis present

## 2024-06-25 DIAGNOSIS — E1159 Type 2 diabetes mellitus with other circulatory complications: Secondary | ICD-10-CM | POA: Diagnosis not present

## 2024-06-25 DIAGNOSIS — B952 Enterococcus as the cause of diseases classified elsewhere: Secondary | ICD-10-CM | POA: Diagnosis not present

## 2024-06-25 DIAGNOSIS — Z7189 Other specified counseling: Secondary | ICD-10-CM | POA: Diagnosis not present

## 2024-06-25 DIAGNOSIS — I361 Nonrheumatic tricuspid (valve) insufficiency: Secondary | ICD-10-CM | POA: Diagnosis not present

## 2024-06-25 DIAGNOSIS — I130 Hypertensive heart and chronic kidney disease with heart failure and stage 1 through stage 4 chronic kidney disease, or unspecified chronic kidney disease: Secondary | ICD-10-CM | POA: Diagnosis present

## 2024-06-25 LAB — CBC WITH DIFFERENTIAL (CANCER CENTER ONLY)
Abs Immature Granulocytes: 0.12 K/uL — ABNORMAL HIGH (ref 0.00–0.07)
Basophils Absolute: 0.1 K/uL (ref 0.0–0.1)
Basophils Relative: 1 %
Eosinophils Absolute: 0.2 K/uL (ref 0.0–0.5)
Eosinophils Relative: 1 %
HCT: 37.5 % — ABNORMAL LOW (ref 39.0–52.0)
Hemoglobin: 11.6 g/dL — ABNORMAL LOW (ref 13.0–17.0)
Immature Granulocytes: 1 %
Lymphocytes Relative: 8 %
Lymphs Abs: 1.6 K/uL (ref 0.7–4.0)
MCH: 22.9 pg — ABNORMAL LOW (ref 26.0–34.0)
MCHC: 30.9 g/dL (ref 30.0–36.0)
MCV: 74.1 fL — ABNORMAL LOW (ref 80.0–100.0)
Monocytes Absolute: 1.6 K/uL — ABNORMAL HIGH (ref 0.1–1.0)
Monocytes Relative: 8 %
Neutro Abs: 15.1 K/uL — ABNORMAL HIGH (ref 1.7–7.7)
Neutrophils Relative %: 81 %
Platelet Count: 401 K/uL — ABNORMAL HIGH (ref 150–400)
RBC: 5.06 MIL/uL (ref 4.22–5.81)
RDW: 20.7 % — ABNORMAL HIGH (ref 11.5–15.5)
WBC Count: 18.7 K/uL — ABNORMAL HIGH (ref 4.0–10.5)
nRBC: 0 % (ref 0.0–0.2)

## 2024-06-25 LAB — CMP (CANCER CENTER ONLY)
ALT: 22 U/L (ref 0–44)
AST: 26 U/L (ref 15–41)
Albumin: 2.9 g/dL — ABNORMAL LOW (ref 3.5–5.0)
Alkaline Phosphatase: 152 U/L — ABNORMAL HIGH (ref 38–126)
Anion gap: 6 (ref 5–15)
BUN: 33 mg/dL — ABNORMAL HIGH (ref 8–23)
CO2: 22 mmol/L (ref 22–32)
Calcium: 9 mg/dL (ref 8.9–10.3)
Chloride: 108 mmol/L (ref 98–111)
Creatinine: 1.94 mg/dL — ABNORMAL HIGH (ref 0.61–1.24)
GFR, Estimated: 35 mL/min — ABNORMAL LOW (ref >60)
Glucose, Bld: 166 mg/dL — ABNORMAL HIGH (ref 70–99)
Potassium: 4.6 mmol/L (ref 3.5–5.1)
Sodium: 136 mmol/L (ref 135–145)
Total Bilirubin: 0.9 mg/dL (ref 0.0–1.2)
Total Protein: 8.1 g/dL (ref 6.5–8.1)

## 2024-06-25 LAB — TSH: TSH: 3.65 u[IU]/mL (ref 0.350–4.500)

## 2024-06-25 MED ORDER — PROCHLORPERAZINE MALEATE 10 MG PO TABS
10.0000 mg | ORAL_TABLET | Freq: Once | ORAL | Status: AC
  Administered 2024-06-25 (×2): 10 mg via ORAL
  Filled 2024-06-25: qty 1

## 2024-06-25 MED ORDER — SODIUM CHLORIDE 0.9 % IV SOLN
INTRAVENOUS | Status: DC
Start: 2024-06-25 — End: 2024-06-25

## 2024-06-25 MED ORDER — SODIUM CHLORIDE 0.9 % IV SOLN
1.2500 mg/kg | Freq: Once | INTRAVENOUS | Status: AC
  Administered 2024-06-25 (×2): 110 mg via INTRAVENOUS
  Filled 2024-06-25: qty 9

## 2024-06-25 MED ORDER — SODIUM CHLORIDE 0.9 % IV SOLN
200.0000 mg | Freq: Once | INTRAVENOUS | Status: AC
  Administered 2024-06-25 (×2): 200 mg via INTRAVENOUS
  Filled 2024-06-25 (×2): qty 8

## 2024-06-25 NOTE — Patient Instructions (Signed)
 [facility] - A DEPT OF [facility]  Discharge Instructions: Thank you for choosing [facility] to provide your oncology and hematology care.   If you have a lab appointment with the Cancer Center, please go directly to the Cancer Center and check in at the registration area.   Wear comfortable clothing and clothing appropriate for easy access to any Portacath or PICC line.   We strive to give you quality time with your provider. You may need to reschedule your appointment if you arrive late (15 or more minutes).  Arriving late affects you and other patients whose appointments are after yours.  Also, if you miss three or more appointments without notifying the office, you may be dismissed from the clinic at the provider's discretion.      For prescription refill requests, have your pharmacy contact our office and allow 72 hours for refills to be completed.    Today you received the following chemotherapy and/or immunotherapy agents Enfortumab, Pembrolizum      To help prevent nausea and vomiting after your treatment, we encourage you to take your nausea medication as directed.  BELOW ARE SYMPTOMS THAT SHOULD BE REPORTED IMMEDIATELY: *FEVER GREATER THAN 100.4 F (38 C) OR HIGHER *CHILLS OR SWEATING *NAUSEA AND VOMITING THAT IS NOT CONTROLLED WITH YOUR NAUSEA MEDICATION *UNUSUAL SHORTNESS OF BREATH *UNUSUAL BRUISING OR BLEEDING *URINARY PROBLEMS (pain or burning when urinating, or frequent urination) *BOWEL PROBLEMS (unusual diarrhea, constipation, pain near the anus) TENDERNESS IN MOUTH AND THROAT WITH OR WITHOUT PRESENCE OF ULCERS (sore throat, sores in mouth, or a toothache) UNUSUAL RASH, SWELLING OR PAIN  UNUSUAL VAGINAL DISCHARGE OR ITCHING   Items with * indicate a potential emergency and should be followed up as soon as possible or go to the Emergency Department if any problems should occur.  Please show the CHEMOTHERAPY ALERT CARD or  IMMUNOTHERAPY ALERT CARD at check-in to the Emergency Department and triage nurse.  Should you have questions after your visit or need to cancel or reschedule your appointment, please contact [facility] - A DEPT OF [facility]  Dept: [phone]  and follow the prompts.  Office hours are 8:00 a.m. to 4:30 p.m. Monday - Friday. Please note that voicemails left after 4:00 p.m. may not be returned until the following business day.  We are closed weekends and major holidays. You have access to a nurse at all times for urgent questions. Please call the main number to the clinic Dept: [phone] and follow the prompts.   For any non-urgent questions, you may also contact your provider using MyChart. We now offer e-Visits for anyone 15 and older to request care online for non-urgent symptoms. For details visit [email or web address].   Also download the MyChart app! Go to the app store, search MyChart, open the app, select [place], and log in with your MyChart username and password.

## 2024-06-25 NOTE — Progress Notes (Signed)
 [facility]   Telephone:([phone] [phone]   Clinic Follow up Note   Patient Care Team: [name], MD as PCP - General (Family Medicine) [name], MD as PCP - Cardiology (Cardiology) [name], MD as PCP - Electrophysiology (Cardiology) [name], MD as Consulting Physician (Urology) [name], MD as Consulting Physician (Cardiology) [name], MD as Consulting Physician (Nephrology) [name], MD as Consulting Physician (Hematology and Oncology) [facility]  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  He has had multiple BCG maintenance therapy, is BCG refractory. -Given his BCG refractory disease, recurrence in bladder and the urethra, I recommend systemic therapy with Keytruda  every 3 weeks (or every 6 weeks if he tolerates well) for 2 years, per NCCN guideline.  He had no history of autoimmune disease, overall in good health, there is no contraindication for immunotherapy. --he started on 06/15/2023. C4 was postponed due to dyspnea and hypoxia on exertion, CT chest was negative for pneumonitis  -repeated cystoscopy and cytology was still positive in early Nov 2024. I discussed with Dr. Renda and we decide continue Keytruda  for additional 3 months and re-evaluate  - He had significant bleeding during cystoscopy and required nephrostomy tube placement in 02/2024 -CT in 05/2024 showed  worsening pelvic and retroperitoneal adenopathy, biopsy unfortunately showed metastatic urothelial carcinoma. 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 Currently receiving Keytruda  every three weeks and Padcev  weekly for two weeks followed by a one-week break. Common side effects such as skin rash, fatigue, and diarrhea were discussed. - Administer pedicel today and weekly for two weeks, followed by a one-week break - PET scan reviewed with patient and his wife, unfortunately he has distant nodal metastasis, his disease is incurable.  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 Pain is not severe, with occasional need for oxycodone . Palliative care consultation is scheduled for further pain management. - Consult with palliative care for pain management tomorrow at 12:30 PM  Protein-calorie malnutrition Protein-calorie malnutrition likely due to decreased oral intake and cancer. Consuming grits, protein shakes, and chicken nuggets, but protein levels remain low.  Plan - Lab reviewed, adequate for treatment, will proceed for cycle day 1 treatment today - He will return next week for day 8 treatment, follow-up next week.     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 Mild degradation secondary to lack of IV contrast and motion. 2. Similar to decrease in mild right-sided bladder wall thickening, nonspecific in the setting of prior bladder cancer. Most likely treatment related. 3. Borderline left external iliac and right inguinal adenopathy, decreased. This could represent response to therapy of metastasis or be reactive. 4. No evidence of abdominal metastasis or recurrent disease, status post right nephrectomy.   03/22/2023 Imaging   MR abdomen without contrast  IMPRESSION: 1. Mild degradation secondary to lack of IV contrast and motion. 2. Similar to decrease in mild right-sided bladder wall thickening, nonspecific in the setting of prior bladder cancer. Most likely treatment related. 3. Borderline left external iliac and right inguinal adenopathy, decreased. This could represent response to therapy of metastasis or be reactive. 4. No evidence of abdominal metastasis or recurrent disease, status post right nephrectomy.     06/08/2023 Cancer Staging   Staging form: Urinary Bladder, AJCC 8th Edition - Clinical stage from 06/08/2023: Stage 0is (cTis, cN0, cM0) - Signed by [name], MD on 06/09/2023 WHO/ISUP grade (low/high): High Grade Histologic grading system: 2 grade system   07/31/2023 Imaging   CT chest without contrast   IMPRESSION: No findings suspicious for pneumonitis.  No evidence of metastatic disease.  Aortic Atherosclerosis (ICD10-I70.0).   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  History of Present Illness Adam Harris is a 78 year old male with metastatic urethral carcinoma who presents for follow-up.  His appetite is improving, and he is eating better, including grits, Boost protein drinks, and occasionally chicken nuggets. He experiences occasional mild discomfort in the stomach and back, managed with oxycodone , which he uses sparingly. He has noticed redness and soreness in the tailbone area with an exudate-like appearance, managed with Band-Aids and witch hazel, showing improvement.     All other systems were reviewed with the patient and are negative.  MEDICAL HISTORY:  Past Medical History:  Diagnosis Date   Abnormal radiologic findings on diagnostic imaging of renal pelvis, ureter, or bladder    bilateral ureter abnormalities   Anticoagulant long-term use    eliquis    Anxiety    pt denies   Arthritis    Atrial fibrillation, chronic (HCC)    CAD (coronary artery disease) cardiologist-- dr hochrein   NSTEMI 02-04-2014  per cardiac cath chronic occluded RCA w/ faint left-to-right collaterals and aneurysmal LCFx with sluggish coronary flow/   NSTEMI --11-21-2016 per cardiac cath occluded proximal RCA & mid to diastal CFX 100%, med rx. If that does not work, PTCA or CABG   Cancer [facility])    bladder and kidney   CHF (congestive heart failure) (HCC)    CKD (chronic kidney disease), stage III (HCC)    patient unaware   DOE (dyspnea on exertion)    Fatty liver  pt denies   Hematuria 02/2019   History of COVID-19 10/2019   History of non-ST elevation myocardial infarction (NSTEMI)    02-04-2014  and 11-21-2016  cardiac cath done both times ,  medically management   History of shingles 12/2017   slight pain and numbness still  noted in the area   Hyperlipidemia    Hypertension    Insomnia    Myocardial infarction [facility]) 2015   Persistent atrial fibrillation [facility])    cardiologsit-- dr hochrein   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 Kennyth   Thoracic aortic atherosclerosis (HCC)    Type 2 diabetes mellitus (HCC)    Urinary frequency     SURGICAL HISTORY: Past Surgical History:  Procedure Laterality Date   CARDIAC CATHETERIZATION N/A 11/21/2016   Procedure: Left Heart Cath and Coronary Angiography;  Surgeon: Debby DELENA Sor, MD;  Location: [facility] INV[facility];  Service: Cardiovascular;  Laterality: N/A;  pRCA 100% , ostial LAD 45%, OM3 80%, mCFX to dCFX 100% (AV groove), lateral OM3 50%   COLONOSCOPY     CYSTOSCOPY WITH BIOPSY N/A 08/12/2020   Procedure: CYSTOSCOPY WITH BLADDER BIOPSY AND TRANSURETHRAL RESECTION OF BLADDER TUMOR;  Surgeon: [name], MD;  Location: [facility];  Service: Urology;  Laterality: N/A;   CYSTOSCOPY WITH BIOPSY N/A 11/10/2021   Procedure: CYSTOSCOPY WITH BLADDER BIOPSIES/ LEFT RETROGRADE;  Surgeon: [name], MD;  Location: [facility];  Service: Urology;  Laterality: N/A;   CYSTOSCOPY WITH BIOPSY Left 07/27/2022   Procedure: CYSTOSCOPY WITH  BLADDER BIOPSY, TRANSURETHRAL FULGERATION OF BLADDER, EXAM UNDER ANESTHESIA, LEFT RETROGRADE PYELOGRAM;  Surgeon: [name], MD;  Location: [facility];  Service: Urology;  Laterality: Left;   CYSTOSCOPY WITH BIOPSY N/A 04/23/2023   Procedure: CYSTOSCOPY WITH BLADDER AND PROSTATIC URETHRAL BIOPSIES;  Surgeon: [name], MD;  Location: [facility];  Service: Urology;  Laterality: N/A;  60 MINUTES NEEDED FOR CASE   CYSTOSCOPY WITH BIOPSY N/A 03/06/2024   Procedure: CYSTOSCOPY, WITH BIOPSY;  Surgeon: [name], MD;  Location: [facility];  Service: Urology;  Laterality: N/A;  CYSTOSCOPY WITH BLADDER BIOPSIES   CYSTOSCOPY WITH FULGERATION N/A 03/06/2024   Procedure: CYSTOSCOPY, WITH  BLADDER FULGURATION, TURBT;  Surgeon: [name], MD;  Location: [facility];  Service: Urology;  Laterality: N/A;   CYSTOSCOPY WITH URETEROSCOPY AND STENT PLACEMENT Right 12/15/2019   Procedure: CYSTOSCOPY WITH RIGHT RETROGRADE/ RIGHT URETEROSCOPY/ BIOPSY;  Surgeon: Ottelin, Mark, MD;  Location: [facility];  Service: Urology;  Laterality: Right;   CYSTOSCOPY/RETROGRADE/URETEROSCOPY Bilateral 03/18/2018   Procedure: CYSTOSCOPY/RETROGRADE/URETEROSCOPY.;  Surgeon: Ottelin, Mark, MD;  Location: [facility];  Service: Urology;  Laterality: Bilateral;   EYE SURGERY Bilateral    cateract in January 2023   HYDROCELE EXCISION Left 07/27/2022   Procedure: HYDROCELE REPAIR;  Surgeon: [name], MD;  [facility];  Service: Urology;  Laterality: Left;  GENERAL ANESTHESIA WITH PARALYSIS   IR FLUORO PROCEDURE UNLISTED  06/13/2024   IR NEPHROSTOMY EXCHANGE LEFT  03/10/2024   IR NEPHROSTOMY EXCHANGE LEFT  04/28/2024   IR NEPHROSTOMY PLACEMENT LEFT  03/07/2024   KNEE ARTHROSCOPY Right    LEFT ATRIAL APPENDAGE OCCLUSION N/A 12/13/2023   Procedure: LEFT ATRIAL APPENDAGE OCCLUSION;  Surgeon: [name], MD;  Location: [facility] INVASIVE CV LAB;  Service: Cardiovascular;  Laterality: N/A;   LEFT HEART CATHETERIZATION WITH CORONARY ANGIOGRAM N/A 02/04/2014   Procedure: LEFT HEART CATHETERIZATION WITH CORONARY ANGIOGRAM;  Surgeon:  Deatrice DELENA Cage, MD;  Location: [facility];  Service: Cardiovascular;  Laterality: N/A;  severe one-vessel CAD, chronically occluded RCA with faint left-to-right collaterals;  aneurysmal LCFx with sluggish coronary flow;  normal LVSF w/ moderately elevated LVEDP (ostialOM2 20%, pOM3 20%, pD1 20%, mCFX 50%, diffuse 20% pCFX)   PROSTATE BIOPSY N/A 08/12/2020   Procedure: BIOPSY TRANSRECTAL ULTRASONIC PROSTATE (TUBP);  Surgeon: [name], MD;  [facility];  Service: Urology;  Laterality: N/A;   ROBOT ASSITED LAPAROSCOPIC NEPHROURETERECTOMY Right  03/22/2020   Procedure: XI ROBOT ASSITED LAPAROSCOPIC NEPHROURETERECTOMY;  Surgeon: [name], MD;  [facility]: [facility];  Service: Urology;  Laterality: Right;   TRANSESOPHAGEAL ECHOCARDIOGRAM (CATH LAB) N/A 12/13/2023   Procedure: TRANSESOPHAGEAL ECHOCARDIOGRAM;  Surgeon: [name], MD;  Location: [facility] INVASIVE CV LAB;  Service: Cardiovascular;  Laterality: N/A;   TRANSESOPHAGEAL ECHOCARDIOGRAM (CATH LAB) N/A 02/11/2024   Procedure: TRANSESOPHAGEAL ECHOCARDIOGRAM;  Surgeon: Barbaraann Darryle Ned, MD;  Location: [facility] INVASIVE CV LAB;  Service: Cardiovascular;  Laterality: N/A;   TRANSTHORACIC ECHOCARDIOGRAM  11-22-2016   dr hochrein   ef 55-60%/ mild MR and TR/ moderate LAE   TRANSURETHRAL RESECTION OF BLADDER TUMOR N/A 02/10/2021   Procedure: TRANSURETHRAL RESECTION OF BLADDER TUMOR (TURBT)/ CYSTOSCOPY/ LEFT RETROGRADE;  Surgeon: [name], MD;  Location: [facility];  Service: Urology;  Laterality: N/A;  GENERAL ANESTHESIA WITH PARALYSIS    I have reviewed the social history and family history with the patient and they are unchanged from previous note.  ALLERGIES:  is allergic to xarelto  [rivaroxaban ].  MEDICATIONS:  Current Outpatient Medications  Medication Sig Dispense Refill   aspirin  EC 81 MG tablet Take 1 tablet (81 mg total) by mouth daily. Swallow whole. 30 tablet 12   Carboxymethylcell-Glycerin  PF 0.5-0.9 % SOLN Place 2 drops into both eyes 4 (four) times daily. 1 each 11   hydrOXYzine  (VISTARIL ) 25 MG capsule Take 1 capsule (25 mg total) by mouth every 8 (eight) hours as needed. 30 capsule 2   isosorbide  mononitrate (IMDUR ) 30 MG 24 hr tablet Take 1 tablet (30 mg total) by mouth daily.     lidocaine -prilocaine  (EMLA ) cream Apply to affected area once 30 g 3   linaclotide  (LINZESS ) 145 MCG CAPS capsule Take 1 capsule (145 mcg total) by mouth daily before breakfast. 30 capsule 3   megestrol  ([name]  ES) 625 MG/5ML suspension Take 5 mLs (625 mg total) by mouth daily. 150 mL 0    metoprolol  tartrate (LOPRESSOR ) 25 MG tablet TAKE ONE TABLET BY MOUTH TWICE A DAY 180 tablet 2   mirtazapine  (REMERON ) 30 MG tablet Take 1 tablet (30 mg total) by mouth at bedtime. 30 tablet 0   nitroGLYCERIN  (NITROSTAT ) 0.4 MG SL tablet TAKE ONE TABLET UNDER THE TONGUE EVERY 5 MINS FOR THREE DOSES AS NEEDED FOR CHEST PAIN. CALL 911 IF 2ND DOSE DOESN'T HELP 25 tablet 11   ondansetron  (ZOFRAN ) 8 MG tablet Take 1 tablet (8 mg total) by mouth every 8 (eight) hours as needed for nausea or vomiting. 20 tablet 0   ondansetron  (ZOFRAN ) 8 MG tablet Take 1 tablet (8 mg total) by mouth every 8 (eight) hours as needed for nausea or vomiting. 30 tablet 1   oxyCODONE  (OXY IR/ROXICODONE ) 5 MG immediate release tablet Take 1-2 tablets (5-10 mg total) by mouth every 4 (four) hours as needed for severe pain (pain score 7-10). 60 tablet 0   Pembrolizumab  (KEYTRUDA  IV) Inject into the vein every 14 (fourteen) days.     polyethylene glycol (MIRALAX  /  GLYCOLAX ) 17 g packet Take 17 g by mouth daily as needed for mild constipation. 14 each 0   prochlorperazine  (COMPAZINE ) 10 MG tablet Take 1 tablet (10 mg total) by mouth every 6 (six) hours as needed for nausea or vomiting. 30 tablet 1   rosuvastatin  (CRESTOR ) 40 MG tablet TAKE 1 TABLET (40 MG TOTAL) BY MOUTH EVERY MORNING. PLEASE KEEP SCHEDULED APPOINTMENT 90 tablet 3   senna-docusate (SENOKOT-S) 8.6-50 MG tablet Take 1 tablet by mouth 2 (two) times daily.     sitaGLIPtin  (JANUVIA ) 50 MG tablet Take 1 tablet (50 mg total) by mouth daily. 90 tablet 3   sodium bicarbonate  650 MG tablet Take 650 mg by mouth 2 (two) times daily.     tamsulosin  (FLOMAX ) 0.4 MG CAPS capsule TAKE ONE CAPSULE BY MOUTH DAILY 30 capsule 11   traZODone  (DESYREL ) 50 MG tablet Take 0.5-1 tablets (25-50 mg total) by mouth at bedtime as needed for sleep. 30 tablet 3   No current facility-administered medications for this visit.   Facility-Administered Medications Ordered in Other Visits  Medication  Dose Route Frequency Provider Last Rate Last Admin   0.9 %  sodium chloride  infusion   Intravenous Continuous [name], MD   Stopped at 06/25/24 1311    PHYSICAL EXAMINATION: ECOG PERFORMANCE STATUS: 2 - Symptomatic, <50% confined to bed  Vitals:   06/25/24 1127  BP: (!) 142/82  Pulse: 80  Resp: 18  Temp: (!) 97 F (36.1 C)  SpO2: 99%   Wt Readings from Last 3 Encounters:  06/25/24 198 lb 9.6 oz (90.1 kg)  06/19/24 191 lb 12.8 oz (87 kg)  06/12/24 189 lb 14.4 oz (86.1 kg)     GENERAL:alert, no distress and comfortable SKIN: skin color, texture, turgor are normal, no rashes or significant lesions EYES: normal, Conjunctiva are pink and non-injected, sclera clear NECK: supple, thyroid  normal size, non-tender, without nodularity LYMPH:  no palpable lymphadenopathy in the cervical, axillary  LUNGS: clear to auscultation and percussion with normal breathing effort HEART: regular rate & rhythm and no murmurs and no lower extremity edema ABDOMEN:abdomen soft, non-tender and normal bowel sounds Musculoskeletal:no cyanosis of digits and no clubbing  NEURO: alert & oriented x 3 with fluent speech, no focal motor/sensory deficits  Physical Exam CHEST: Lungs clear to auscultation.  LABORATORY DATA:  I have reviewed the data as listed    Latest Ref Rng & Units 06/25/2024   10:23 AM 06/12/2024    3:17 PM 06/02/2024    1:01 PM  CBC  WBC 4.0 - 10.5 K/uL 18.7  17.5  15.0   Hemoglobin 13.0 - 17.0 g/dL 88.3  88.2  87.9   Hematocrit 39.0 - 52.0 % 37.5  36.7  40.0   Platelets 150 - 400 K/uL 401  372  309         Latest Ref Rng & Units 06/25/2024   10:23 AM 06/12/2024    3:17 PM 06/02/2024    1:01 PM  CMP  Glucose 70 - 99 mg/dL 833  818  765   BUN 8 - 23 mg/dL 33  27  31   Creatinine 0.61 - 1.24 mg/dL 8.05  8.25  7.65   Sodium 135 - 145 mmol/L 136  132  134   Potassium 3.5 - 5.1 mmol/L 4.6  4.7  4.3   Chloride 98 - 111 mmol/L 108  102  99   CO2 22 - 32 mmol/L 22  23  22    Calcium   8.9 -  10.3 mg/dL 9.0  9.0  8.8   Total Protein 6.5 - 8.1 g/dL 8.1  8.2    Total Bilirubin 0.0 - 1.2 mg/dL 0.9  1.6    Alkaline Phos 38 - 126 U/L 152  181    AST 15 - 41 U/L 26  26    ALT 0 - 44 U/L 22  23        RADIOGRAPHIC STUDIES: I have personally reviewed the radiological images as listed and agreed with the findings in the report. No results found.    Orders Placed This Encounter  Procedures   CBC with Differential (Cancer Center Only)    Standing Status:   Future    Expected Date:   08/27/2024    Expiration Date:   08/27/2025   CMP (Cancer Center only)    Standing Status:   Future    Expected Date:   08/27/2024    Expiration Date:   08/27/2025   CBC with Differential (Cancer Center Only)    Standing Status:   Future    Expected Date:   09/03/2024    Expiration Date:   09/03/2025   CMP (Cancer Center only)    Standing Status:   Future    Expected Date:   09/03/2024    Expiration Date:   09/03/2025   All questions were answered. The patient knows to call the clinic with any problems, questions or concerns. No barriers to learning was detected. The total time spent in the appointment was 25 minutes, including review of chart and various tests results, discussions about plan of care and coordination of care plan     Onita Mattock, MD 06/25/2024

## 2024-06-25 NOTE — Assessment & Plan Note (Signed)
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  He has had multiple BCG maintenance therapy, is BCG refractory. -Given his BCG refractory disease, recurrence in bladder and the urethra, I recommend systemic therapy with Keytruda  every 3 weeks (or every 6 weeks if he tolerates well) for 2 years, per NCCN guideline.  He had no history of autoimmune disease, overall in good health, there is no contraindication for immunotherapy. --he started on 06/15/2023. C4 was postponed due to dyspnea and hypoxia on exertion, CT chest was negative for pneumonitis  -repeated cystoscopy and cytology was still positive in early Nov 2024. I discussed with Dr. Renda and we decide continue Keytruda  for additional 3 months and re-evaluate  - He had significant bleeding during cystoscopy and required nephrostomy tube placement in 02/2024 -CT in 05/2024 showed worsening pelvic and retroperitoneal adenopathy, biopsy unfortunately showed metastatic urothelial carcinoma. -PET scan 06/13/2014 showed extensive hypermetabolic pelvic and retroperitoneal, left supraclavicular and subclavicular adenopathy  -plan to start Padcev  and Keytruda  on 06/25/2024

## 2024-06-26 ENCOUNTER — Encounter: Payer: Self-pay | Admitting: Nurse Practitioner

## 2024-06-26 ENCOUNTER — Inpatient Hospital Stay: Admitting: Nurse Practitioner

## 2024-06-26 ENCOUNTER — Telehealth: Payer: Self-pay

## 2024-06-26 VITALS — BP 137/89 | HR 87 | Temp 98.5°F | Resp 20 | Wt 198.5 lb

## 2024-06-26 DIAGNOSIS — C61 Malignant neoplasm of prostate: Secondary | ICD-10-CM | POA: Diagnosis not present

## 2024-06-26 DIAGNOSIS — C689 Malignant neoplasm of urinary organ, unspecified: Secondary | ICD-10-CM

## 2024-06-26 DIAGNOSIS — G893 Neoplasm related pain (acute) (chronic): Secondary | ICD-10-CM

## 2024-06-26 DIAGNOSIS — Z515 Encounter for palliative care: Secondary | ICD-10-CM | POA: Diagnosis not present

## 2024-06-26 LAB — T4: T4, Total: 4.4 ug/dL — ABNORMAL LOW (ref 4.5–12.0)

## 2024-06-26 MED ORDER — OXYCODONE HCL 5 MG PO TABS: 5.0000 mg | ORAL_TABLET | ORAL | 0 refills | Status: DC | PRN

## 2024-06-26 NOTE — Telephone Encounter (Unsigned)
 Copied from CRM [id number]. Topic: Clinical - Red Word Triage >> Jun 24, 2024 12:54 PM Deleta RAMAN wrote: Red Word that prompted transfer to Nurse Triage: patient is dealing with confusion and memory loss that is causing frustration >> Jun 25, 2024  4:58 PM Chiquita SQUIBB wrote: Patients wife is calling in again due to not hearing back from Dr. Thedora if anything can be done to help with his memory loss and confusion. Please advise the patients wife.

## 2024-06-26 NOTE — Telephone Encounter (Unsigned)
 Copied from CRM [id number]. Topic: Clinical - Red Word Triage >> Jun 24, 2024 12:54 PM Deleta RAMAN wrote: Red Word that prompted transfer to Nurse Triage: patient is dealing with confusion and memory loss that is causing frustration >> Jun 26, 2024 12:21 PM [place] J wrote: Patient's wife returning Zhavia Cunanan's call. Unfortunately, I could not transfer over because of lunch time but I notified the patient's wife that their lunch would be over around 1:00 PM.    Please call patient's wife back with an update on current situation. >> Jun 25, 2024  4:58 PM Chiquita SQUIBB wrote: Patients wife is calling in again due to not hearing back from Dr. Thedora if anything can be done to help with his memory loss and confusion. Please advise the patients wife.

## 2024-06-26 NOTE — Telephone Encounter (Signed)
 Spoke to patient's wife and was able to schedule him tomorrow at 1:20 pm due to a cancellation.  Dm/cma

## 2024-06-26 NOTE — Telephone Encounter (Signed)
 LM for patient that this nurse was calling to see how they were doing after their treatment. Please call back to Dr. Latanya Maudlin nurse at [phone] if they have any questions or concerns regarding the treatment.

## 2024-06-26 NOTE — Telephone Encounter (Signed)
-----   Message from Nurse Selinda E sent at 06/25/2024  2:51 PM EDT ----- Regarding: first time padcev - Adam Harris Patient received Padcev  for the first time today. He also received keytruda  but it is not his first time for that. Did well with no issues.

## 2024-06-26 NOTE — Telephone Encounter (Signed)
 Left FM to rtn call. Dm/cma

## 2024-06-27 ENCOUNTER — Encounter: Payer: Self-pay | Admitting: Hematology

## 2024-06-27 ENCOUNTER — Other Ambulatory Visit (HOSPITAL_COMMUNITY): Payer: Self-pay | Admitting: Interventional Radiology

## 2024-06-27 ENCOUNTER — Inpatient Hospital Stay (HOSPITAL_COMMUNITY)
Admission: EM | Admit: 2024-06-27 | Discharge: 2024-07-10 | DRG: 871 | Disposition: A | Discharge status: Skilled Nursing Facility | Attending: Internal Medicine | Admitting: Internal Medicine

## 2024-06-27 ENCOUNTER — Encounter: Payer: Self-pay | Admitting: Family Medicine

## 2024-06-27 ENCOUNTER — Ambulatory Visit: Admitting: Family Medicine

## 2024-06-27 ENCOUNTER — Ambulatory Visit (HOSPITAL_COMMUNITY)
Admission: RE | Admit: 2024-06-27 | Discharge: 2024-06-27 | Disposition: A | Discharge status: Home / Self Care | Source: Ambulatory Visit | Attending: Interventional Radiology | Admitting: Interventional Radiology

## 2024-06-27 VITALS — BP 120/74 | HR 70 | Temp 97.5°F | Ht 71.0 in | Wt 198.0 lb

## 2024-06-27 DIAGNOSIS — Z87891 Personal history of nicotine dependence: Secondary | ICD-10-CM

## 2024-06-27 DIAGNOSIS — Z8249 Family history of ischemic heart disease and other diseases of the circulatory system: Secondary | ICD-10-CM

## 2024-06-27 DIAGNOSIS — D509 Iron deficiency anemia, unspecified: Secondary | ICD-10-CM | POA: Diagnosis present

## 2024-06-27 DIAGNOSIS — T83512A Infection and inflammatory reaction due to nephrostomy catheter, initial encounter: Principal | ICD-10-CM | POA: Diagnosis present

## 2024-06-27 DIAGNOSIS — C679 Malignant neoplasm of bladder, unspecified: Secondary | ICD-10-CM

## 2024-06-27 DIAGNOSIS — E1165 Type 2 diabetes mellitus with hyperglycemia: Secondary | ICD-10-CM | POA: Diagnosis present

## 2024-06-27 DIAGNOSIS — Z7982 Long term (current) use of aspirin: Secondary | ICD-10-CM

## 2024-06-27 DIAGNOSIS — I2510 Atherosclerotic heart disease of native coronary artery without angina pectoris: Secondary | ICD-10-CM | POA: Diagnosis present

## 2024-06-27 DIAGNOSIS — R410 Disorientation, unspecified: Principal | ICD-10-CM

## 2024-06-27 DIAGNOSIS — J440 Chronic obstructive pulmonary disease with acute lower respiratory infection: Secondary | ICD-10-CM | POA: Diagnosis present

## 2024-06-27 DIAGNOSIS — G9341 Metabolic encephalopathy: Secondary | ICD-10-CM | POA: Diagnosis present

## 2024-06-27 DIAGNOSIS — L89323 Pressure ulcer of left buttock, stage 3: Secondary | ICD-10-CM

## 2024-06-27 DIAGNOSIS — E785 Hyperlipidemia, unspecified: Secondary | ICD-10-CM | POA: Diagnosis present

## 2024-06-27 DIAGNOSIS — E1159 Type 2 diabetes mellitus with other circulatory complications: Secondary | ICD-10-CM | POA: Diagnosis not present

## 2024-06-27 DIAGNOSIS — Z8616 Personal history of COVID-19: Secondary | ICD-10-CM

## 2024-06-27 DIAGNOSIS — Z7984 Long term (current) use of oral hypoglycemic drugs: Secondary | ICD-10-CM

## 2024-06-27 DIAGNOSIS — I130 Hypertensive heart and chronic kidney disease with heart failure and stage 1 through stage 4 chronic kidney disease, or unspecified chronic kidney disease: Secondary | ICD-10-CM | POA: Diagnosis present

## 2024-06-27 DIAGNOSIS — I10 Essential (primary) hypertension: Secondary | ICD-10-CM | POA: Diagnosis not present

## 2024-06-27 DIAGNOSIS — N1330 Unspecified hydronephrosis: Secondary | ICD-10-CM | POA: Insufficient documentation

## 2024-06-27 DIAGNOSIS — N184 Chronic kidney disease, stage 4 (severe): Secondary | ICD-10-CM | POA: Diagnosis present

## 2024-06-27 DIAGNOSIS — Y838 Other surgical procedures as the cause of abnormal reaction of the patient, or of later complication, without mention of misadventure at the time of the procedure: Secondary | ICD-10-CM | POA: Diagnosis present

## 2024-06-27 DIAGNOSIS — Z800 Family history of malignant neoplasm of digestive organs: Secondary | ICD-10-CM

## 2024-06-27 DIAGNOSIS — R509 Fever, unspecified: Secondary | ICD-10-CM

## 2024-06-27 DIAGNOSIS — C689 Malignant neoplasm of urinary organ, unspecified: Secondary | ICD-10-CM | POA: Diagnosis present

## 2024-06-27 DIAGNOSIS — Z6826 Body mass index (BMI) 26.0-26.9, adult: Secondary | ICD-10-CM

## 2024-06-27 DIAGNOSIS — R6520 Severe sepsis without septic shock: Secondary | ICD-10-CM | POA: Diagnosis present

## 2024-06-27 DIAGNOSIS — Z7901 Long term (current) use of anticoagulants: Secondary | ICD-10-CM

## 2024-06-27 DIAGNOSIS — Z936 Other artificial openings of urinary tract status: Secondary | ICD-10-CM

## 2024-06-27 DIAGNOSIS — Y9301 Activity, walking, marching and hiking: Secondary | ICD-10-CM | POA: Diagnosis present

## 2024-06-27 DIAGNOSIS — I071 Rheumatic tricuspid insufficiency: Secondary | ICD-10-CM | POA: Diagnosis present

## 2024-06-27 DIAGNOSIS — A419 Sepsis, unspecified organism: Secondary | ICD-10-CM | POA: Diagnosis present

## 2024-06-27 DIAGNOSIS — E874 Mixed disorder of acid-base balance: Secondary | ICD-10-CM | POA: Diagnosis not present

## 2024-06-27 DIAGNOSIS — Z888 Allergy status to other drugs, medicaments and biological substances status: Secondary | ICD-10-CM

## 2024-06-27 DIAGNOSIS — Z515 Encounter for palliative care: Secondary | ICD-10-CM

## 2024-06-27 DIAGNOSIS — C775 Secondary and unspecified malignant neoplasm of intrapelvic lymph nodes: Secondary | ICD-10-CM | POA: Diagnosis not present

## 2024-06-27 DIAGNOSIS — Z66 Do not resuscitate: Secondary | ICD-10-CM | POA: Diagnosis not present

## 2024-06-27 DIAGNOSIS — Z79899 Other long term (current) drug therapy: Secondary | ICD-10-CM

## 2024-06-27 DIAGNOSIS — K760 Fatty (change of) liver, not elsewhere classified: Secondary | ICD-10-CM | POA: Diagnosis present

## 2024-06-27 DIAGNOSIS — R531 Weakness: Secondary | ICD-10-CM | POA: Diagnosis not present

## 2024-06-27 DIAGNOSIS — I472 Ventricular tachycardia, unspecified: Secondary | ICD-10-CM | POA: Diagnosis not present

## 2024-06-27 DIAGNOSIS — G893 Neoplasm related pain (acute) (chronic): Secondary | ICD-10-CM | POA: Diagnosis present

## 2024-06-27 DIAGNOSIS — Z8619 Personal history of other infectious and parasitic diseases: Secondary | ICD-10-CM

## 2024-06-27 DIAGNOSIS — Z95818 Presence of other cardiac implants and grafts: Secondary | ICD-10-CM

## 2024-06-27 DIAGNOSIS — W19XXXA Unspecified fall, initial encounter: Secondary | ICD-10-CM | POA: Diagnosis not present

## 2024-06-27 DIAGNOSIS — I4821 Permanent atrial fibrillation: Secondary | ICD-10-CM | POA: Diagnosis present

## 2024-06-27 DIAGNOSIS — E440 Moderate protein-calorie malnutrition: Secondary | ICD-10-CM | POA: Diagnosis present

## 2024-06-27 DIAGNOSIS — G4700 Insomnia, unspecified: Secondary | ICD-10-CM | POA: Diagnosis present

## 2024-06-27 DIAGNOSIS — I5032 Chronic diastolic (congestive) heart failure: Secondary | ICD-10-CM | POA: Diagnosis present

## 2024-06-27 DIAGNOSIS — M25519 Pain in unspecified shoulder: Secondary | ICD-10-CM | POA: Diagnosis not present

## 2024-06-27 DIAGNOSIS — C61 Malignant neoplasm of prostate: Secondary | ICD-10-CM | POA: Diagnosis present

## 2024-06-27 DIAGNOSIS — I252 Old myocardial infarction: Secondary | ICD-10-CM

## 2024-06-27 DIAGNOSIS — E8770 Fluid overload, unspecified: Secondary | ICD-10-CM | POA: Diagnosis not present

## 2024-06-27 DIAGNOSIS — Z7189 Other specified counseling: Secondary | ICD-10-CM

## 2024-06-27 DIAGNOSIS — B958 Unspecified staphylococcus as the cause of diseases classified elsewhere: Secondary | ICD-10-CM

## 2024-06-27 DIAGNOSIS — N1832 Chronic kidney disease, stage 3b: Secondary | ICD-10-CM | POA: Diagnosis present

## 2024-06-27 DIAGNOSIS — W1830XA Fall on same level, unspecified, initial encounter: Secondary | ICD-10-CM | POA: Diagnosis present

## 2024-06-27 DIAGNOSIS — C772 Secondary and unspecified malignant neoplasm of intra-abdominal lymph nodes: Secondary | ICD-10-CM | POA: Insufficient documentation

## 2024-06-27 DIAGNOSIS — E1122 Type 2 diabetes mellitus with diabetic chronic kidney disease: Secondary | ICD-10-CM | POA: Diagnosis present

## 2024-06-27 DIAGNOSIS — I2721 Secondary pulmonary arterial hypertension: Secondary | ICD-10-CM | POA: Diagnosis present

## 2024-06-27 DIAGNOSIS — A4102 Sepsis due to Methicillin resistant Staphylococcus aureus: Secondary | ICD-10-CM | POA: Diagnosis present

## 2024-06-27 DIAGNOSIS — N179 Acute kidney failure, unspecified: Secondary | ICD-10-CM | POA: Diagnosis present

## 2024-06-27 DIAGNOSIS — S0011XA Contusion of right eyelid and periocular area, initial encounter: Secondary | ICD-10-CM | POA: Diagnosis present

## 2024-06-27 DIAGNOSIS — E876 Hypokalemia: Secondary | ICD-10-CM | POA: Diagnosis not present

## 2024-06-27 DIAGNOSIS — J189 Pneumonia, unspecified organism: Secondary | ICD-10-CM | POA: Diagnosis present

## 2024-06-27 DIAGNOSIS — Z436 Encounter for attention to other artificial openings of urinary tract: Secondary | ICD-10-CM | POA: Diagnosis not present

## 2024-06-27 DIAGNOSIS — C770 Secondary and unspecified malignant neoplasm of lymph nodes of head, face and neck: Secondary | ICD-10-CM | POA: Insufficient documentation

## 2024-06-27 DIAGNOSIS — N136 Pyonephrosis: Secondary | ICD-10-CM | POA: Diagnosis present

## 2024-06-27 DIAGNOSIS — R627 Adult failure to thrive: Secondary | ICD-10-CM | POA: Diagnosis present

## 2024-06-27 DIAGNOSIS — I700 Atherosclerosis of aorta: Secondary | ICD-10-CM | POA: Diagnosis present

## 2024-06-27 DIAGNOSIS — I4891 Unspecified atrial fibrillation: Secondary | ICD-10-CM | POA: Diagnosis not present

## 2024-06-27 DIAGNOSIS — M1990 Unspecified osteoarthritis, unspecified site: Secondary | ICD-10-CM | POA: Diagnosis present

## 2024-06-27 MED ORDER — LIDOCAINE HCL 1 % IJ SOLN
20.0000 mL | Freq: Once | INTRAMUSCULAR | Status: AC
  Administered 2024-06-27: 3 mL via INTRADERMAL

## 2024-06-27 MED ORDER — IOHEXOL 300 MG/ML  SOLN
50.0000 mL | Freq: Once | INTRAMUSCULAR | Status: AC | PRN
  Administered 2024-06-27: 30 mL

## 2024-06-27 MED ORDER — LIDOCAINE HCL 1 % IJ SOLN
INTRAMUSCULAR | Status: AC
  Filled 2024-06-27: qty 20

## 2024-06-27 NOTE — Progress Notes (Signed)
 Palliative Medicine [facility]  Telephone:([phone] Fax:(336) [phone]   Name: Adam Harris Date: 06/27/2024 MRN: [id number]  DOB: February 21, 1946  Patient Care Team: [name], MD as PCP - General (Family Medicine) [name], MD as PCP - Cardiology (Cardiology) [name], MD as PCP - Electrophysiology (Cardiology) [name], MD as Consulting Physician (Urology) [name], MD as Consulting Physician (Cardiology) [name], MD as Consulting Physician (Nephrology) [name], MD as Consulting Physician (Hematology and Oncology) [facility]    REASON FOR CONSULTATION: Adam Harris is a 78 y.o. 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  Palliative is seeing patient for symptom management and goals of care.    SOCIAL HISTORY:     reports that he quit smoking about 53 years ago. His smoking use included cigarettes. He started smoking about 57 years ago. He has a 1 pack-year smoking history. He has been exposed to tobacco smoke. He quit smokeless tobacco use about 53 years ago.  His smokeless tobacco use included chew. He reports that he does not drink alcohol  and does not use drugs.  ADVANCE DIRECTIVES:  None on file   CODE STATUS: Full code  PAST MEDICAL HISTORY: Past Medical History:  Diagnosis Date   Abnormal radiologic findings on diagnostic imaging of renal pelvis, ureter, or bladder    bilateral ureter abnormalities   Anticoagulant long-term use    eliquis    Anxiety    pt denies   Arthritis    Atrial fibrillation, chronic (HCC)    CAD (coronary artery disease) cardiologist-- dr hochrein   NSTEMI 02-04-2014  per cardiac cath chronic occluded RCA w/ faint left-to-right  collaterals and aneurysmal LCFx with sluggish coronary flow/   NSTEMI --11-21-2016 per cardiac cath occluded proximal RCA & mid to diastal CFX 100%, med rx. If that does not work, PTCA or CABG   Cancer [facility])    bladder and kidney   CHF (congestive heart failure) (HCC)    CKD (chronic kidney disease), stage III (HCC)    patient unaware   DOE (dyspnea on exertion)    Fatty liver    pt denies   Hematuria 02/2019   History of COVID-19 10/2019   History of non-ST elevation myocardial infarction (NSTEMI)    02-04-2014  and 11-21-2016  cardiac cath done both times ,  medically management   History of shingles 12/2017   slight pain and numbness still noted in the area   Hyperlipidemia    Hypertension    Insomnia    Myocardial infarction [facility]) 2015   Persistent atrial fibrillation [facility])    cardiologsit-- dr hochrein   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 Kennyth   Thoracic aortic atherosclerosis (HCC)    Type 2 diabetes mellitus (HCC)    Urinary frequency     PAST SURGICAL HISTORY:  Past Surgical History:  Procedure Laterality Date   CARDIAC CATHETERIZATION N/A 11/21/2016   Procedure: Left Heart Cath and Coronary Angiography;  Surgeon: Debby DELENA Sor, MD;  Location: [facility] INVASIVE CV LAB;  Service: Cardiovascular;  Laterality: N/A;  pRCA 100% , ostial LAD 45%, OM3 80%, mCFX to dCFX 100% (AV groove), lateral OM3 50%   COLONOSCOPY     CYSTOSCOPY WITH BIOPSY N/A  08/12/2020   Procedure: CYSTOSCOPY WITH BLADDER BIOPSY AND TRANSURETHRAL RESECTION OF BLADDER TUMOR;  Surgeon: [name], MD;  Location: [facility] ORS;  Service: Urology;  Laterality: N/A;   CYSTOSCOPY WITH BIOPSY N/A 11/10/2021   Procedure: CYSTOSCOPY WITH BLADDER BIOPSIES/ LEFT RETROGRADE;  Surgeon: [name], MD;  Location: [facility];  Service: Urology;  Laterality: N/A;   CYSTOSCOPY WITH BIOPSY Left 07/27/2022   Procedure: CYSTOSCOPY WITH  BLADDER BIOPSY,  TRANSURETHRAL FULGERATION OF BLADDER, EXAM UNDER ANESTHESIA, LEFT RETROGRADE PYELOGRAM;  Surgeon: [name], MD;  Location: [facility];  Service: Urology;  Laterality: Left;   CYSTOSCOPY WITH BIOPSY N/A 04/23/2023   Procedure: CYSTOSCOPY WITH BLADDER AND PROSTATIC URETHRAL BIOPSIES;  Surgeon: [name], MD;  Location: [facility];  Service: Urology;  Laterality: N/A;  60 MINUTES NEEDED FOR CASE   CYSTOSCOPY WITH BIOPSY N/A 03/06/2024   Procedure: CYSTOSCOPY, WITH BIOPSY;  Surgeon: [name], MD;  Location: [facility];  Service: Urology;  Laterality: N/A;  CYSTOSCOPY WITH BLADDER BIOPSIES   CYSTOSCOPY WITH FULGERATION N/A 03/06/2024   Procedure: CYSTOSCOPY, WITH BLADDER FULGURATION, TURBT;  Surgeon: [name], MD;  Location: [facility];  Service: Urology;  Laterality: N/A;   CYSTOSCOPY WITH URETEROSCOPY AND STENT PLACEMENT Right 12/15/2019   Procedure: CYSTOSCOPY WITH RIGHT RETROGRADE/ RIGHT URETEROSCOPY/ BIOPSY;  Surgeon: Ottelin, Mark, MD;  Location: [facility];  Service: Urology;  Laterality: Right;   CYSTOSCOPY/RETROGRADE/URETEROSCOPY Bilateral 03/18/2018   Procedure: CYSTOSCOPY/RETROGRADE/URETEROSCOPY.;  Surgeon: Ottelin, Mark, MD;  Location: [facility];  Service: Urology;  Laterality: Bilateral;   EYE SURGERY Bilateral    cateract in January 2023   HYDROCELE EXCISION Left 07/27/2022   Procedure: HYDROCELE REPAIR;  Surgeon: [name], MD;  Location: [facility];  Service: Urology;  Laterality: Left;  GENERAL ANESTHESIA WITH PARALYSIS   IR FLUORO PROCEDURE UNLISTED  06/13/2024   IR NEPHROSTOMY EXCHANGE LEFT  03/10/2024   IR NEPHROSTOMY EXCHANGE LEFT  04/28/2024   IR NEPHROSTOMY PLACEMENT LEFT  03/07/2024   KNEE ARTHROSCOPY Right    LEFT ATRIAL APPENDAGE OCCLUSION N/A 12/13/2023   Procedure: LEFT ATRIAL APPENDAGE OCCLUSION;  Surgeon: [name], MD;  Location: [facility] INVASIVE CV LAB;  Service: Cardiovascular;  Laterality: N/A;   LEFT HEART CATHETERIZATION WITH  CORONARY ANGIOGRAM N/A 02/04/2014   Procedure: LEFT HEART CATHETERIZATION WITH CORONARY ANGIOGRAM;  Surgeon: Deatrice DELENA Cage, MD;  Location: [facility] CATH LAB;  Service: Cardiovascular;  Laterality: N/A;  severe one-vessel CAD, chronically occluded RCA with faint left-to-right collaterals;  aneurysmal LCFx with sluggish coronary flow;  normal LVSF w/ moderately elevated LVEDP (ostialOM2 20%, pOM3 20%, pD1 20%, mCFX 50%, diffuse 20% pCFX)   PROSTATE BIOPSY N/A 08/12/2020   Procedure: BIOPSY TRANSRECTAL ULTRASONIC PROSTATE (TUBP);  Surgeon: [name], MD;  Location: [facility];  Service: Urology;  Laterality: N/A;   ROBOT ASSITED LAPAROSCOPIC NEPHROURETERECTOMY Right 03/22/2020   Procedure: XI ROBOT ASSITED LAPAROSCOPIC NEPHROURETERECTOMY;  Surgeon: [name], MD;  Location: [facility];  Service: Urology;  Laterality: Right;   TRANSESOPHAGEAL ECHOCARDIOGRAM (CATH LAB) N/A 12/13/2023   Procedure: TRANSESOPHAGEAL ECHOCARDIOGRAM;  Surgeon: [name], MD;  Location: [facility] INVASIVE CV LAB;  Service: Cardiovascular;  Laterality: N/A;   TRANSESOPHAGEAL ECHOCARDIOGRAM (CATH LAB) N/A 02/11/2024   Procedure: TRANSESOPHAGEAL ECHOCARDIOGRAM;  Surgeon: Barbaraann Darryle Ned, MD;  Location: [facility] INVASIVE CV LAB;  Service: Cardiovascular;  Laterality: N/A;   TRANSTHORACIC ECHOCARDIOGRAM  11-22-2016   dr hochrein   ef 55-60%/ mild MR and TR/ moderate LAE   TRANSURETHRAL RESECTION OF BLADDER TUMOR N/A  02/10/2021   Procedure: TRANSURETHRAL RESECTION OF BLADDER TUMOR (TURBT)/ CYSTOSCOPY/ LEFT RETROGRADE;  Surgeon: [name], MD;  Location: [facility];  Service: Urology;  Laterality: N/A;  GENERAL ANESTHESIA WITH PARALYSIS    HEMATOLOGY/ONCOLOGY HISTORY:  Oncology History Overview Note   Cancer Staging  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 Mild degradation secondary to lack of IV contrast and motion. 2. Similar to decrease in mild right-sided bladder wall thickening, nonspecific in the setting of prior bladder cancer. Most likely treatment related. 3. Borderline left external iliac and right inguinal adenopathy, decreased. This could represent response to therapy of metastasis or be reactive. 4. No evidence of abdominal metastasis or recurrent disease, status post right nephrectomy.   03/22/2023 Imaging   MR abdomen without contrast  IMPRESSION: 1. Mild degradation secondary to lack of IV contrast and motion. 2. Similar to decrease in mild right-sided bladder wall thickening, nonspecific in the setting of prior bladder cancer. Most likely treatment related. 3. Borderline left external iliac and right inguinal adenopathy, decreased. This could represent response to therapy of metastasis or be reactive. 4. No evidence of abdominal metastasis or recurrent disease, status post right nephrectomy.     06/08/2023 Cancer Staging   Staging form: Urinary Bladder, AJCC 8th Edition - Clinical stage from 06/08/2023: Stage 0is (cTis, cN0, cM0) - Signed by [name], MD on 06/09/2023 WHO/ISUP grade (low/high): High Grade Histologic grading system: 2 grade system   07/31/2023 Imaging   CT chest without contrast   IMPRESSION: No findings suspicious for pneumonitis.  No evidence of metastatic disease.  Aortic Atherosclerosis (ICD10-I70.0).   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  MEDICATIONS:  Current Outpatient Medications  Medication Sig Dispense Refill   aspirin  EC 81 MG tablet Take 1 tablet (81 mg total) by mouth daily. Swallow whole. 30 tablet 12   Carboxymethylcell-Glycerin  PF 0.5-0.9 % SOLN Place 2 drops into both eyes 4 (four) times daily. 1 each 11   hydrOXYzine  (VISTARIL ) 25 MG capsule Take 1 capsule (25 mg total) by mouth every 8 (eight) hours as needed. 30 capsule 2   isosorbide  mononitrate (IMDUR ) 30 MG 24 hr tablet Take 1 tablet (30 mg total) by mouth daily.     lidocaine -prilocaine  (EMLA ) cream Apply to affected area once 30 g 3   linaclotide  (LINZESS ) 145 MCG CAPS capsule Take 1 capsule (145 mcg total) by mouth daily before breakfast. 30 capsule 3   megestrol  ([name]  ES) 625 MG/5ML suspension Take 5 mLs (625 mg  total) by mouth daily. 150 mL 0   metoprolol  tartrate (LOPRESSOR ) 25 MG tablet TAKE ONE TABLET BY MOUTH TWICE A DAY 180 tablet 2   mirtazapine  (REMERON ) 30 MG tablet Take 1 tablet (30 mg total) by mouth at bedtime. 30 tablet 0   nitroGLYCERIN  (NITROSTAT ) 0.4 MG SL tablet TAKE ONE TABLET UNDER THE TONGUE EVERY 5 MINS FOR THREE DOSES AS NEEDED FOR CHEST PAIN. CALL 911 IF 2ND DOSE DOESN'T HELP 25 tablet 11   ondansetron  (ZOFRAN ) 8 MG tablet Take 1 tablet (8 mg total) by mouth every 8 (eight) hours as needed for nausea or vomiting. 20 tablet 0   ondansetron  (ZOFRAN ) 8 MG tablet Take 1 tablet (8 mg total) by mouth every 8 (eight) hours as needed for nausea or vomiting. 30 tablet 1   [START ON 07/02/2024] oxyCODONE  (OXY IR/ROXICODONE ) 5 MG immediate release tablet Take 1-2 tablets (5-10 mg  total) by mouth every 4 (four) hours as needed for severe pain (pain score 7-10). 60 tablet 0   Pembrolizumab  (KEYTRUDA  IV) Inject into the vein every 14 (fourteen) days.     polyethylene glycol (MIRALAX  / GLYCOLAX ) 17 g packet Take 17 g by mouth daily as needed for mild constipation. 14 each 0   prochlorperazine  (COMPAZINE ) 10 MG tablet Take 1 tablet (10 mg total) by mouth every 6 (six) hours as needed for nausea or vomiting. 30 tablet 1   rosuvastatin  (CRESTOR ) 40 MG tablet TAKE 1 TABLET (40 MG TOTAL) BY MOUTH EVERY MORNING. PLEASE KEEP SCHEDULED APPOINTMENT 90 tablet 3   senna-docusate (SENOKOT-S) 8.6-50 MG tablet Take 1 tablet by mouth 2 (two) times daily.     sitaGLIPtin  (JANUVIA ) 50 MG tablet Take 1 tablet (50 mg total) by mouth daily. 90 tablet 3   sodium bicarbonate  650 MG tablet Take 650 mg by mouth 2 (two) times daily.     tamsulosin  (FLOMAX ) 0.4 MG CAPS capsule TAKE ONE CAPSULE BY MOUTH DAILY 30 capsule 11   traZODone  (DESYREL ) 50 MG tablet Take 0.5-1 tablets (25-50 mg total) by mouth at bedtime as needed for sleep. 30 tablet 3   No current facility-administered medications for this visit.    VITAL SIGNS: BP 137/89 (BP Location: Left Arm, Patient Position: Sitting)   Pulse 87   Temp 98.5 F (36.9 C) (Temporal)   Resp 20   Wt 198 lb 8 oz (90 kg)   SpO2 100%   BMI 27.69 kg/m  Filed Weights   06/26/24 1255  Weight: 198 lb 8 oz (90 kg)    Estimated body mass index is 27.69 kg/m as calculated from the following:   Height as of 06/25/24: 5' 11 (1.803 m).   Weight as of this encounter: 198 lb 8 oz (90 kg).  LABS: CBC:    Component Value Date/Time   WBC 18.7 (H) 06/25/2024 1023   WBC 15.0 (H) 06/02/2024 1301   HGB 11.6 (L) 06/25/2024 1023   HGB 13.6 11/05/2023 1104   HCT 37.5 (L) 06/25/2024 1023   HCT 41.7 11/05/2023 1104   PLT 401 (H) 06/25/2024 1023   PLT 156 11/05/2023 1104   MCV 74.1 (L) 06/25/2024 1023   MCV 90 11/05/2023 1104   NEUTROABS 15.1 (H) 06/25/2024  1023   LYMPHSABS 1.6 06/25/2024 1023   MONOABS 1.6 (H) 06/25/2024 1023   EOSABS 0.2 06/25/2024 1023   BASOSABS 0.1 06/25/2024 1023   Comprehensive Metabolic Panel:    Component Value Date/Time   NA 136 06/25/2024 1023   NA 141  11/05/2023 1104   K 4.6 06/25/2024 1023   CL 108 06/25/2024 1023   CO2 22 06/25/2024 1023   BUN 33 (H) 06/25/2024 1023   BUN 24 11/05/2023 1104   CREATININE 1.94 (H) 06/25/2024 1023   CREATININE 1.77 (H) 01/01/2017 0810   GLUCOSE 166 (H) 06/25/2024 1023   CALCIUM  9.0 06/25/2024 1023   AST 26 06/25/2024 1023   ALT 22 06/25/2024 1023   ALKPHOS 152 (H) 06/25/2024 1023   BILITOT 0.9 06/25/2024 1023   PROT 8.1 06/25/2024 1023   ALBUMIN  2.9 (L) 06/25/2024 1023    RADIOGRAPHIC STUDIES: NM PET Image Initial (PI) Skull Base To Thigh Result Date: 06/18/2024 CLINICAL DATA:  Initial treatment strategy for bladder cancer. EXAM: NUCLEAR MEDICINE PET SKULL BASE TO THIGH TECHNIQUE: 9.41 mCi F-18 FDG was injected intravenously. Full-ring PET imaging was performed from the skull base to thigh after the radiotracer. CT data was obtained and used for attenuation correction and anatomic localization. Fasting blood glucose: 170 mg/dl COMPARISON:  CT scans 92/86/7974 FINDINGS: Mediastinal blood pool activity: SUV max 2.7 Liver activity: SUV max NA NECK: Enlarged and hypermetabolic left supraclavicular and subclavicular adenopathy. Supraclavicular node measures 12 mm on image 47/4 and has an SUV max of 5.6. Left subclavicular node measures 12 mm on image 54/4 in has an SUV max of 6.5. Incidental CT findings: Bilateral carotid artery calcifications. CHEST: Small scattered mediastinal and prevascular lymph nodes demonstrating low level FDG uptake. Prevascular node measures 7 mm and SUV max is 2.9. Lymph node between the aorta and esophagus has an SUV max of 4.0. These nodes are unchanged since the chest CT from 07/31/2023 and are likely reactive/inflammatory. No hypermetabolic pulmonary  lesions. Numerous small scattered calcified and noncalcified pulmonary nodules are indeterminate finding but appears stable since a prior CT scan from 07/31/2023 suggesting a benign process. Attention on future studies is suggested. Incidental CT findings: Stable atherosclerotic calcifications involving the aorta and coronary arteries. Left atrial closure device noted. ABDOMEN/PELVIS: Extensive retroperitoneal and pelvic adenopathy as demonstrated on the prior CT scan. These nodes are hypermetabolic. Left para-aortic nodal group have an SUV max of 11.9. Right para-aortic nodal group has an SUV max of 10.5. Right common iliac nodes have an SUV max of 10.3 and left common iliac nodes have an SUV max of 7.3. Left-sided external iliac nodal disease has an SUV max of 5.9. Right external iliac nodal disease has an SUV max of 6.3. There is a 11 mm right-sided pelvic nodes adjacent to the rectum which is hypermetabolic with SUV max of 3.8. No inguinal adenopathy. No findings for hepatic or adrenal gland metastasis. No discrete hypermetabolic bladder lesion is identified. Incidental CT findings: The right kidney is surgically absent. The left kidney contains a nephrostomy tube. Aortic and branch vessel atherosclerotic disease appears relatively stable. SKELETON: No findings suspicious for osseous metastatic disease. Incidental CT findings: No lytic or sclerotic bone lesions. Degenerative changes involving the spine. IMPRESSION: 1. Extensive hypermetabolic retroperitoneal and pelvic adenopathy consistent with metastatic disease. 2. Hypermetabolic left supraclavicular and subclavicular adenopathy consistent with metastatic disease. 3. No discrete hypermetabolic bladder lesion is identified. 4. Numerous small scattered calcified and noncalcified pulmonary nodules are indeterminate finding but appears stable since a prior CT scan from 07/31/2023 suggesting a benign process. Attention on future studies is suggested. 5. Borderline  enlarged mediastinal and hilar nodes demonstrating low level hypermetabolism. These are stable in size since a CT scan from 07/31/2023 and are likely benign/reactive. 6. Aortic atherosclerosis. Aortic Atherosclerosis (ICD10-I70.0).  Electronically Signed   By: MYRTIS Stammer M.D.   On: 06/18/2024 17:03   IR UNLISTED ABDOMINAL PROCEDURE Result Date: 06/13/2024 Katha Harlene ORN, RT     06/13/2024  9:48 AM Patient presented to IR today with complaints of left nephrostomy tube drainage bag leaking.  Nephrostomy tube dressing was removed and noted to be dry, tube was assessed and noted that suture was no longer intact.  Nephrostomy tube was still draining adequately into drainage bag but bag was noted to be leaking.  The nephrostomy tube was flushed and there was no leaking noted around the tube.  A new drainage bag was attached to the nephrostomy tube and a new dressing, including a stat-loc securement device was placed to hold tube in place due to suture being broken. Patient was instructed to call IR should any additional concerns arise regarding nephrostomy tube or drainage bag.  Patient was also instructed to return on Wednesday, June 18, 2024 at 3:30pm for previously scheduled routine nephrostomy tube exchange.   CT BIOPSY Result Date: 05/30/2024 CLINICAL DATA:  Urothelial carcinoma of the bladder with pelvic and retroperitoneal lymphadenopathy. The patient presents for lymph node biopsy. EXAM: CT GUIDED CORE BIOPSY OF LEFT ILIAC LYMPH NODE ANESTHESIA/SEDATION: Moderate (conscious) sedation was employed during this procedure. A total of Versed  0.5 mg and Fentanyl  25 mcg was administered intravenously. Moderate Sedation Time: 33 minutes. The patient's level of consciousness and vital signs were monitored continuously by radiology nursing throughout the procedure under my direct supervision. PROCEDURE: The procedure risks, benefits, and alternatives were explained to the patient. Questions regarding the procedure  were encouraged and answered. The patient understands and consents to the procedure. A time out was performed prior to initiating the procedure. RADIATION DOSE REDUCTION: This exam was performed according to the departmental dose-optimization program which includes automated exposure control, adjustment of the mA and/or kV according to patient size and/or use of iterative reconstruction technique. The left lower pelvic abdominal wall was prepped with chlorhexidine  in a sterile fashion, and a sterile drape was applied covering the operative field. A sterile gown and sterile gloves were used for the procedure. Local anesthesia was provided with 1% Lidocaine . Under CT guidance, a 17 gauge trocar needle was advanced to the level of a left external iliac lymph node. After confirming needle tip position, 3 separate 18 gauge coaxial core biopsy samples were obtained and submitted in saline. Gel-Foam pledgets were advanced through the outer needle prior to its retraction and removal. Additional CT was performed. COMPLICATIONS: Minimal hemorrhage anterior to lymph node. FINDINGS: Initial CT demonstrates enlarged left external iliac lymph nodes with the largest measuring up to approximately 3 cm. Solid tissue was obtained with core biopsy. Due to some bleeding emanating from the outer needle with biopsy, Gel-Foam pledgets were advanced through the trocar needle prior to its removal. Post biopsy imaging demonstrates a small amount of hemorrhage anterior to the lymph node after biopsy. IMPRESSION: CT-guided core biopsy performed of enlarged left external iliac lymph node. Electronically Signed   By: Marcey Moan M.D.   On: 05/30/2024 13:18    PERFORMANCE STATUS (ECOG) : 2 - Symptomatic, <50% confined to bed  Review of Systems  Constitutional:  Positive for activity change, appetite change and fatigue.  Gastrointestinal:  Positive for abdominal pain.  Unless otherwise noted, a complete review of systems is  negative.  Physical Exam General: NAD Cardiovascular: regular rate and rhythm Pulmonary: clear ant fields Abdomen: soft, nontender, + bowel sounds Extremities: no edema, no  joint deformities Skin: no rashes Neurological: Alert and oriented x3  Discussed the use of AI scribe software for clinical note transcription with the patient, who gave verbal consent to proceed.  History of Present Illness Adam Harris is a 78 year old male with metastatic urothelial cancer who presents today for his initial palliative visit. He and wife shares concerns with falls and pain management.  He is accompanied by his wife, who is his primary caregiver. No acute distress noted. Patient is alert and able to engage appropriately in discussions.   I introduced myself, Maygan RN, and Palliative's role in collaboration with the oncology team. Concept of Palliative Care was introduced as specialized medical care for people and their families living with serious illness.  It focuses on providing relief from the symptoms and stress of a serious illness.  The goal is to improve quality of life for both the patient and the family. Values and goals of care important to patient and family were attempted to be elicited.  Mr. Kornegay lives him the home with his wife of more than 49 years. They have 3 children. He is a retired Banker for [employer].   At home patient requires some assistance with his ADLs due to fatigue, weakness, and pain. He has been experiencing falls at home, which he attributes to carelessness rather than dizziness or balance issues. He sustained an injury to his eye from a recent fall. He uses a walker at times for mobility and has a cane, although he finds the cane more difficult to use. His daughter, who does not live with him but frequently visits, assists in his care.  He is currently on megace  and mirtazapine  to improve his appetite, which he takes once a day, preferably at  night due to its sedative effects. His weight has been stable, and he consumes protein shakes once or twice a day to maintain his nutrition. His daughter assists by preparing these shakes. His current weight is up to 198lbs from 191lbs on 8/7.   He is on Linzess  145 mcg once daily for bowel management, which he finds helpful. However, he experienced diarrhea following a recent cancer treatment, which is a known side effect. We discussed holding his Linzess  for 1-2 days post treatment to prevent increased stools.   Mr. Asper reports experiencing pain primarily in the lower abdomen and groin area. He takes oxycodone  for pain relief, typically two tablets at night and as needed during the day. He describes the pain as persistent and difficult to characterize, but notes that oxycodone  provides relief. He feels pain is well controlled at this time. No adjustments to be made to current regimen. Patient and wife understands we will continue to closely monitor.   Goals of Care We discussed his current illness and what it means in the larger context of his on-going co-morbidities. Natural disease trajectory and expectations were discussed. Patient and wife are able to verbalize understanding of overall condition.   I empathetically approached discussions regarding healthcare limitations, code status, and directives. Patient states his wife is his Clinical research associate. He speaks to no desire for his life being prolonged stating if it comes to this point let me go, it has been rough! Emotional support provided. Wife expresses disagreement with patient's consideration for DNR expressing she would want him brought back. Encouraged ongoing family discussions. Education provided on what DNR/DNI would look like for patient.   Patient and wife clear in expressed wishes to continue  to treat the treatable allowing him every opportunity to thrive. His quality of life is most important.   I discussed the importance of  continued conversation with family and their medical providers regarding overall plan of care and treatment options, ensuring decisions are within the context of the patients values and GOCs.  Assessment & Plan Established therapeutic relationship. Education provided on palliative's role in collaboration with their Oncology/Radiation team.  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 - Monitor for diarrhea and hold Linzess  if diarrhea occurs.  Neoplasm-related pain Pain in the lower abdomen and groin area, likely related to metastatic cancer. Pain is managed with oxycodone , which is effective when taking two tablets. - Continue oxycodone  5-10 mg every 4 hours as needed. - Send prescription for oxycodone  to pharmacy as requested.   Constipation Constipation managed with Linzess  145 mcg daily. Recent cancer treatment may cause diarrhea, so Linzess  should be held if diarrhea occurs. - Continue Linzess  145 mcg daily. - Hold Linzess  if diarrhea occurs.  Risk of falls Increased risk of falls, possibly due to carelessness. Uses a walker and cane, but a more stable walker with a seat is needed. - Order a sturdy walker with a seat through insurance. - Order a smaller shower chair through insurance.  Poor appetite Poor appetite managed with appetite stimulant medication, taken once daily at night due to sedative effects. Weight has stabilized with the use of protein shakes and Boost supplements. - Continue appetite stimulant medication ( [name] /Mirtazapine ) once daily at night. - Continue protein shakes and Boost supplements once or twice daily.  Goals of Care Desire for natural death expressed. Does not wish to have CPR or life support if heart stops or breathing ceases. Will continue further discussions with wife who expresses wishes for life-sustaining  measures to allow time for outcomes.  -Clear wishes to treat the treatable allowing stability. Quality of life is important.   I will plan to see patient back in 1-2 weeks. Sooner if needed.   Patient expressed understanding and was in agreement with this plan. He also understands that He can call the clinic at any time with any questions, concerns, or complaints.   Thank you for your referral and allowing Palliative to assist in Mr. Mitesh Rosendahl care.   Number and complexity of problems addressed: HIGH - 1 or more chronic illnesses with SEVERE exacerbation, progression, or side effects of treatment - advanced cancer, pain. Any controlled substances utilized were prescribed in the context of palliative care.  Visit consisted of counseling and education dealing with the complex and emotionally intense issues of symptom management and palliative care in the setting of serious and potentially life-threatening illness.  Signed by: [name] Borer, AGPCNP-BC Palliative Medicine Team/[facility]

## 2024-06-27 NOTE — Progress Notes (Signed)
 [facility] [address] [place] [address] Dept: [phone] Dept Fax: [phone]  Office Visit  Subjective:    Patient ID: Adam Harris, male    DOB: 09/24/1946, 78 y.o..   MRN: [id number]  Chief Complaint  Patient presents with   Follow-up    C/o having confusion    History of Present Illness:  Patient is in today with concerns about some confusion. 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 He is working with oncology and recently started chemotherapy. He is experiencing pelvic and abdominal pain, significant weight loss, and progressive weakness and unsteadiness. He admits that he ahs been having some forgetfulness and has found certain tasks such as writing checks or balancing his checkbook to be difficult. [name] is taking oxycodone  5 mg 1-2 tabs at night for his pain and to help him sleep. He is on trazodone  25-50 mg at bedtime for sleep as well. He was started on mirtazapine  for appetite.  Additionally, his health is complicated by atrial fibrillation, CAD, chronic diastolic heart failure, Type 2 diabetes, and stage 4 CKD. Recently he was found to have a stage 3 ulcer on his left buttocks.  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) 06/04/2024   Generalized weakness 05/25/2024   Complicated UTI (urinary tract infection) 05/25/2024   Type 2 diabetes mellitus with hyperglycemia (HCC) 05/25/2024   Nephrostomy present  (HCC) 04/01/2024   AKI (acute kidney injury) (HCC) 03/08/2024   Tachyarrhythmia 03/07/2024   Acute blood loss anemia 03/07/2024   Dysuria 01/25/2024   Presence of Watchman left atrial appendage closure device 12/13/2023   Permanent atrial fibrillation (HCC) 11/29/2023   Hx of hematuria 11/29/2023   Lower urinary tract symptoms (LUTS) 04/06/2023   CKD (chronic kidney disease), stage IV (HCC) 03/22/2023   Prostate cancer (HCC) 06/21/2022   Cataract cortical, senile, left 12/13/2021   Diabetic peripheral neuropathy (HCC) 12/13/2021   Hydrocele- Left 12/02/2021   Solitary kidney, acquired 10/26/2021   Hypertensive renal disease 10/26/2021   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   Chronic diastolic CHF (congestive heart failure), NYHA class 2 (HCC) 04/02/2017   Atrial fibrillation with RVR (HCC) 11/20/2016   OA (osteoarthritis) of knee 05/31/2016   Low back pain 05/31/2016   Exertional angina (HCC) 10/29/2014   Coronary artery disease involving native coronary artery of native heart with angina pectoris (HCC) 03/09/2014   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/2014   Hyperlipidemia 02/05/2014   History of non-ST elevation myocardial infarction (NSTEMI) 02/03/2014   Essential hypertension 02/03/2014   Anxiety state 04/19/2013   History of prostate cancer 04/19/2013   Insomnia 04/19/2013   Stiffness of joint, lower leg 04/19/2013   Sciatica 04/19/2013   Past Surgical History:  Procedure Laterality Date   CARDIAC CATHETERIZATION N/A 11/21/2016   Procedure: Left Heart Cath and Coronary Angiography;  Surgeon: Debby DELENA Sor, MD;  Location: [facility] INVASIVE CV LAB;  Service: Cardiovascular;  Laterality:  N/A;  pRCA 100% , ostial LAD 45%, OM3 80%, mCFX to dCFX 100% (AV groove), lateral OM3 50%   COLONOSCOPY     CYSTOSCOPY WITH BIOPSY N/A 08/12/2020   Procedure: CYSTOSCOPY WITH BLADDER  BIOPSY AND TRANSURETHRAL RESECTION OF BLADDER TUMOR;  Surgeon: [name], MD;  Location: [facility];  Service: Urology;  Laterality: N/A;   CYSTOSCOPY WITH BIOPSY N/A 11/10/2021   Procedure: CYSTOSCOPY WITH BLADDER BIOPSIES/ LEFT RETROGRADE;  Surgeon: [name], MD;  Location: [facility];  Service: Urology;  Laterality: N/A;   CYSTOSCOPY WITH BIOPSY Left 07/27/2022   Procedure: CYSTOSCOPY WITH  BLADDER BIOPSY, TRANSURETHRAL FULGERATION OF BLADDER, EXAM UNDER ANESTHESIA, LEFT RETROGRADE PYELOGRAM;  Surgeon: [name], MD;  Location: [facility];  Service: Urology;  Laterality: Left;   CYSTOSCOPY WITH BIOPSY N/A 04/23/2023   Procedure: CYSTOSCOPY WITH BLADDER AND PROSTATIC URETHRAL BIOPSIES;  Surgeon: [name], MD;  Location: [facility];  Service: Urology;  Laterality: N/A;  60 MINUTES NEEDED FOR CASE   CYSTOSCOPY WITH BIOPSY N/A 03/06/2024   Procedure: CYSTOSCOPY, WITH BIOPSY;  Surgeon: [name], MD;  Location: [facility];  Service: Urology;  Laterality: N/A;  CYSTOSCOPY WITH BLADDER BIOPSIES   CYSTOSCOPY WITH FULGERATION N/A 03/06/2024   Procedure: CYSTOSCOPY, WITH BLADDER FULGURATION, TURBT;  Surgeon: [name], MD;  Location: [facility];  Service: Urology;  Laterality: N/A;   CYSTOSCOPY WITH URETEROSCOPY AND STENT PLACEMENT Right 12/15/2019   Procedure: CYSTOSCOPY WITH RIGHT RETROGRADE/ RIGHT URETEROSCOPY/ BIOPSY;  Surgeon: Ottelin, Mark, MD;  Location: [facility];  Service: Urology;  Laterality: Right;   CYSTOSCOPY/RETROGRADE/URETEROSCOPY Bilateral 03/18/2018   Procedure: CYSTOSCOPY/RETROGRADE/URETEROSCOPY.;  Surgeon: Ottelin, Mark, MD;  Location: [facility];  Service: Urology;  Laterality: Bilateral;   EYE SURGERY Bilateral    cateract in January 2023   HYDROCELE EXCISION Left 07/27/2022   Procedure: HYDROCELE REPAIR;  Surgeon: [name], MD;  Location: [facility];  Service: Urology;  Laterality: Left;  GENERAL ANESTHESIA WITH PARALYSIS   IR FLUORO PROCEDURE  UNLISTED  06/13/2024   IR NEPHROSTOMY EXCHANGE LEFT  03/10/2024   IR NEPHROSTOMY EXCHANGE LEFT  04/28/2024   IR NEPHROSTOMY PLACEMENT LEFT  03/07/2024   KNEE ARTHROSCOPY Right    LEFT ATRIAL APPENDAGE OCCLUSION N/A 12/13/2023   Procedure: LEFT ATRIAL APPENDAGE OCCLUSION;  Surgeon: [name], MD;  Location: [facility] INVASIVE CV LAB;  Service: Cardiovascular;  Laterality: N/A;   LEFT HEART CATHETERIZATION WITH CORONARY ANGIOGRAM N/A 02/04/2014   Procedure: LEFT HEART CATHETERIZATION WITH CORONARY ANGIOGRAM;  Surgeon: Deatrice DELENA Cage, MD;  Location: [facility] C[facility];  Service: Cardiovascular;  Laterality: N/A;  severe one-vessel CAD, chronically occluded RCA with faint left-to-right collaterals;  aneurysmal LCFx with sluggish coronary flow;  normal LVSF w/ moderately elevated LVEDP (ostialOM2 20%, pOM3 20%, pD1 20%, mCFX 50%, diffuse 20% pCFX)   PROSTATE BIOPSY N/A 08/12/2020   Procedure: BIOPSY TRANSRECTAL ULTRASONIC PROSTATE (TUBP);  Surgeon: [name], MD;  Location: [facility];  Service: Urology;  Laterality: N/A;   ROBOT ASSITED LAPAROSCOPIC NEPHROURETERECTOMY Right 03/22/2020   Procedure: XI ROBOT ASSITED LAPAROSCOPIC NEPHROURETERECTOMY;  Surgeon: [name], MD;  Location: [facility];  Service: Urology;  Laterality: Right;   TRANSESOPHAGEAL ECHOCARDIOGRAM (CATH LAB) N/A 12/13/2023   Procedure: TRANSESOPHAGEAL ECHOCARDIOGRAM;  Surgeon: [name], MD;  Location: [facility] INVASIVE CV LAB;  Service: Cardiovascular;  Laterality: N/A;   TRANSESOPHAGEAL ECHOCARDIOGRAM (CATH LAB) N/A 02/11/2024   Procedure: TRANSESOPHAGEAL ECHOCARDIOGRAM;  Surgeon: Barbaraann Darryle Ned, MD;  Location: [facility] INVASIVE CV LAB;  Service: Cardiovascular;  Laterality: N/A;   TRANSTHORACIC ECHOCARDIOGRAM  11-22-2016   dr hochrein   ef 55-60%/ mild MR and TR/ moderate LAE   TRANSURETHRAL RESECTION OF BLADDER TUMOR N/A 02/10/2021   Procedure: TRANSURETHRAL RESECTION OF BLADDER TUMOR (TURBT)/ CYSTOSCOPY/ LEFT RETROGRADE;  Surgeon: [name], MD;  Location: [facility];  Service: Urology;  Laterality: N/A;  GENERAL ANESTHESIA WITH PARALYSIS   Family History  Problem Relation Age of Onset   Hypertension Mother    Cancer Mother        anal cancer   Esophageal cancer Neg Hx    Pancreatic cancer Neg Hx    Stomach cancer Neg Hx    Liver disease Neg Hx    Outpatient Medications Prior to Visit  Medication Sig Dispense Refill   aspirin  EC 81 MG tablet Take 1 tablet (81 mg total) by mouth daily. Swallow whole. 30 tablet 12   Carboxymethylcell-Glycerin  PF 0.5-0.9 % SOLN Place 2 drops into both eyes 4 (four) times daily. 1 each 11   hydrOXYzine  (VISTARIL ) 25 MG capsule Take 1 capsule (25 mg total) by mouth every 8 (eight) hours as needed. 30 capsule 2   isosorbide  mononitrate (IMDUR ) 30 MG 24 hr tablet Take 1 tablet (30 mg total) by mouth daily.     lidocaine -prilocaine  (EMLA ) cream Apply to affected area once 30 g 3   linaclotide  (LINZESS ) 145 MCG CAPS capsule Take 1 capsule (145 mcg total) by mouth daily before breakfast. 30 capsule 3   megestrol  ([name]  ES) 625 MG/5ML suspension Take 5 mLs (625 mg total) by mouth daily. 150 mL 0   metoprolol  tartrate (LOPRESSOR ) 25 MG tablet TAKE ONE TABLET BY MOUTH TWICE A DAY 180 tablet 2   mirtazapine  (REMERON ) 30 MG tablet Take 1 tablet (30 mg total) by mouth at bedtime. 30 tablet 0   nitroGLYCERIN  (NITROSTAT ) 0.4 MG SL tablet TAKE ONE TABLET UNDER THE TONGUE EVERY 5 MINS FOR THREE DOSES AS NEEDED FOR CHEST PAIN. CALL 911 IF 2ND DOSE DOESN'T HELP 25 tablet 11   ondansetron  (ZOFRAN ) 8 MG tablet Take 1 tablet (8 mg total) by mouth every 8 (eight) hours as needed for nausea or vomiting. 20 tablet 0   ondansetron  (ZOFRAN ) 8 MG tablet Take 1 tablet (8 mg total) by mouth every 8 (eight) hours as needed for nausea or vomiting. 30 tablet 1   [START ON 07/02/2024] oxyCODONE  (OXY IR/ROXICODONE ) 5 MG immediate release tablet Take 1-2 tablets (5-10 mg total) by mouth every 4 (four) hours as needed for severe  pain (pain score 7-10). 60 tablet 0   Pembrolizumab  (KEYTRUDA  IV) Inject into the vein every 14 (fourteen) days.     polyethylene glycol (MIRALAX  / GLYCOLAX ) 17 g packet Take 17 g by mouth daily as needed for mild constipation. 14 each 0   prochlorperazine  (COMPAZINE ) 10 MG tablet Take 1 tablet (10 mg total) by mouth every 6 (six) hours as needed for nausea or vomiting. 30 tablet 1   rosuvastatin  (CRESTOR ) 40 MG tablet TAKE 1 TABLET (40 MG TOTAL) BY MOUTH EVERY MORNING. PLEASE KEEP SCHEDULED APPOINTMENT 90 tablet 3   senna-docusate (SENOKOT-S) 8.6-50 MG tablet Take 1 tablet by mouth 2 (two) times daily.     sitaGLIPtin  (JANUVIA ) 50 MG tablet Take 1 tablet (50 mg total) by mouth daily. 90 tablet 3   sodium bicarbonate  650 MG tablet Take 650 mg by mouth 2 (two) times daily.     tamsulosin  (FLOMAX ) 0.4 MG CAPS capsule TAKE ONE CAPSULE BY MOUTH DAILY  30 capsule 11   traZODone  (DESYREL ) 50 MG tablet Take 0.5-1 tablets (25-50 mg total) by mouth at bedtime as needed for sleep. 30 tablet 3   No facility-administered medications prior to visit.   Allergies  Allergen Reactions   Xarelto  [Rivaroxaban ] Other (See Comments)    Skin Blisters      Objective:   Today's Vitals   06/27/24 1318  BP: 120/74  Pulse: 70  Temp: (!) 97.5 F (36.4 C)  TempSrc: Temporal  SpO2: 98%  Weight: 198 lb (89.8 kg)  Height: 5' 11 (1.803 m)   Body mass index is 27.62 kg/m.   General: Thin and starting to appear cachetic. No acute distress. HEENT: Normocephalic. There is a bruise over the right cheek. Lungs: Clear to auscultation bilaterally. No wheezing, rales or rhonchi. CV: Irregular rhythm with normal rate. Pulses 2+ bilaterally. Extremities: 1+ edema. Psych: Mildly lethargic. Oriented to person and place, and partially to time.  Health Maintenance Due  Topic Date Due   Diabetic kidney evaluation - Urine ACR  Never done   Zoster Vaccines- Shingrix (1 of 2) Never done   OPHTHALMOLOGY EXAM  05/18/2022      Assessment & Plan:   Problem List Items Addressed This Visit       Cardiovascular and Mediastinum   Type 2 diabetes mellitus with cardiac complication (HCC)   I will check his blood sugars to assess whether this could be contributing to delirium.        Nervous and Auditory   Acute delirium - Primary   Likely multifactorial. I will check labs to assess for potential causes.      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 Molzahn metastatic cancer is a likely contributor to his weight loss/malnutrition. This could certainly be causing some of his confusion. He is on opioids and other psychoactive drugs that could also play a role. As well, he has started on Keytruda , which can cause confusion. I will check labs for potential causes. He is engaged with palliative care. We will need to monitor him with time. I advised his wife to have a low threshold for taking him to the ED this weekend if his symptoms worsen.        Other   Nephrostomy present (HCC)   I will check a UA to make sure there is no infection contributing to his delirium.      Pressure injury of left buttock, stage 3 (HCC)   His wife should continue to clean this daily with a saline wound spray, pat dry, and then apply a hydrocolloid wound dressing. I will renew the request  for home health nursing to assist her in wound management.       Relevant Orders   Ambulatory referral to Home Health    Return for Follow-up as scheduled.   Garnette CHRISTELLA Simpler, MD

## 2024-06-27 NOTE — Assessment & Plan Note (Signed)
 Mr. Vanvranken metastatic cancer is a likely contributor to his weight loss/malnutrition. This could certainly be causing some of his confusion. He is on opioids and other psychoactive drugs that could also play a role. As well, he has started on Keytruda , which can cause confusion. I will check labs for potential causes. He is engaged with palliative care. We will need to monitor him with time. I advised his wife to have a low threshold for taking him to the ED this weekend if his symptoms worsen.

## 2024-06-27 NOTE — Telephone Encounter (Signed)
 Adam Harris states pt has now been discharged from [facility], effective this week, after multiple refusals of therapy.

## 2024-06-27 NOTE — Assessment & Plan Note (Addendum)
 I will check a UA to make sure there is no infection contributing to his delirium.

## 2024-06-27 NOTE — Assessment & Plan Note (Signed)
 I will check his blood sugars to assess whether this could be contributing to delirium.

## 2024-06-27 NOTE — ED Triage Notes (Signed)
 Pt BIB EMS from home. EMS reports PT was eating in his car when  his wife came outside pt was in car with heat on for roughly 90 mins, When walking into house pt fell into flower bed, pt is not able to remember this event. Unknown if pt hit head, pt wife says pt is not on plavix , but pt daughter believes he is.    Old bruising around right eye.   Hx afib, HR 90-130, 152/100, cbg 283, 98% RA

## 2024-06-27 NOTE — Assessment & Plan Note (Signed)
 His wife should continue to clean this daily with a saline wound spray, pat dry, and then apply a hydrocolloid wound dressing. I will renew the request  for home health nursing to assist her in wound management.

## 2024-06-27 NOTE — Assessment & Plan Note (Signed)
 Likely multifactorial. I will check labs to assess for potential causes.

## 2024-06-28 ENCOUNTER — Emergency Department (HOSPITAL_COMMUNITY)

## 2024-06-28 ENCOUNTER — Other Ambulatory Visit: Payer: Self-pay

## 2024-06-28 ENCOUNTER — Encounter: Payer: Self-pay | Admitting: Hematology

## 2024-06-28 DIAGNOSIS — E782 Mixed hyperlipidemia: Secondary | ICD-10-CM

## 2024-06-28 DIAGNOSIS — N390 Urinary tract infection, site not specified: Secondary | ICD-10-CM | POA: Diagnosis not present

## 2024-06-28 DIAGNOSIS — R531 Weakness: Secondary | ICD-10-CM

## 2024-06-28 DIAGNOSIS — B952 Enterococcus as the cause of diseases classified elsewhere: Secondary | ICD-10-CM | POA: Diagnosis not present

## 2024-06-28 DIAGNOSIS — E1165 Type 2 diabetes mellitus with hyperglycemia: Secondary | ICD-10-CM

## 2024-06-28 DIAGNOSIS — J440 Chronic obstructive pulmonary disease with acute lower respiratory infection: Secondary | ICD-10-CM | POA: Diagnosis present

## 2024-06-28 DIAGNOSIS — D509 Iron deficiency anemia, unspecified: Secondary | ICD-10-CM | POA: Diagnosis present

## 2024-06-28 DIAGNOSIS — E440 Moderate protein-calorie malnutrition: Secondary | ICD-10-CM | POA: Diagnosis present

## 2024-06-28 DIAGNOSIS — R6520 Severe sepsis without septic shock: Secondary | ICD-10-CM | POA: Diagnosis not present

## 2024-06-28 DIAGNOSIS — W19XXXA Unspecified fall, initial encounter: Secondary | ICD-10-CM | POA: Diagnosis present

## 2024-06-28 DIAGNOSIS — I361 Nonrheumatic tricuspid (valve) insufficiency: Secondary | ICD-10-CM | POA: Diagnosis not present

## 2024-06-28 DIAGNOSIS — Z936 Other artificial openings of urinary tract status: Secondary | ICD-10-CM | POA: Diagnosis not present

## 2024-06-28 DIAGNOSIS — Z66 Do not resuscitate: Secondary | ICD-10-CM | POA: Diagnosis not present

## 2024-06-28 DIAGNOSIS — I5032 Chronic diastolic (congestive) heart failure: Secondary | ICD-10-CM | POA: Diagnosis not present

## 2024-06-28 DIAGNOSIS — I4821 Permanent atrial fibrillation: Secondary | ICD-10-CM

## 2024-06-28 DIAGNOSIS — J811 Chronic pulmonary edema: Secondary | ICD-10-CM | POA: Diagnosis not present

## 2024-06-28 DIAGNOSIS — C61 Malignant neoplasm of prostate: Secondary | ICD-10-CM | POA: Diagnosis present

## 2024-06-28 DIAGNOSIS — T83512A Infection and inflammatory reaction due to nephrostomy catheter, initial encounter: Secondary | ICD-10-CM | POA: Diagnosis not present

## 2024-06-28 DIAGNOSIS — I25119 Atherosclerotic heart disease of native coronary artery with unspecified angina pectoris: Secondary | ICD-10-CM | POA: Diagnosis not present

## 2024-06-28 DIAGNOSIS — I10 Essential (primary) hypertension: Secondary | ICD-10-CM

## 2024-06-28 DIAGNOSIS — E874 Mixed disorder of acid-base balance: Secondary | ICD-10-CM | POA: Diagnosis not present

## 2024-06-28 DIAGNOSIS — J189 Pneumonia, unspecified organism: Secondary | ICD-10-CM | POA: Diagnosis not present

## 2024-06-28 DIAGNOSIS — Z8616 Personal history of COVID-19: Secondary | ICD-10-CM | POA: Diagnosis not present

## 2024-06-28 DIAGNOSIS — A419 Sepsis, unspecified organism: Secondary | ICD-10-CM | POA: Diagnosis present

## 2024-06-28 DIAGNOSIS — I700 Atherosclerosis of aorta: Secondary | ICD-10-CM | POA: Diagnosis not present

## 2024-06-28 DIAGNOSIS — Z515 Encounter for palliative care: Secondary | ICD-10-CM | POA: Diagnosis not present

## 2024-06-28 DIAGNOSIS — M5030 Other cervical disc degeneration, unspecified cervical region: Secondary | ICD-10-CM | POA: Diagnosis not present

## 2024-06-28 DIAGNOSIS — Z7189 Other specified counseling: Secondary | ICD-10-CM | POA: Diagnosis not present

## 2024-06-28 DIAGNOSIS — D72829 Elevated white blood cell count, unspecified: Secondary | ICD-10-CM | POA: Diagnosis not present

## 2024-06-28 DIAGNOSIS — Z043 Encounter for examination and observation following other accident: Secondary | ICD-10-CM | POA: Diagnosis not present

## 2024-06-28 DIAGNOSIS — C678 Malignant neoplasm of overlapping sites of bladder: Secondary | ICD-10-CM | POA: Diagnosis not present

## 2024-06-28 DIAGNOSIS — R918 Other nonspecific abnormal finding of lung field: Secondary | ICD-10-CM | POA: Diagnosis not present

## 2024-06-28 DIAGNOSIS — I130 Hypertensive heart and chronic kidney disease with heart failure and stage 1 through stage 4 chronic kidney disease, or unspecified chronic kidney disease: Secondary | ICD-10-CM | POA: Diagnosis present

## 2024-06-28 DIAGNOSIS — R410 Disorientation, unspecified: Secondary | ICD-10-CM | POA: Diagnosis present

## 2024-06-28 DIAGNOSIS — I2510 Atherosclerotic heart disease of native coronary artery without angina pectoris: Secondary | ICD-10-CM | POA: Diagnosis not present

## 2024-06-28 DIAGNOSIS — I330 Acute and subacute infective endocarditis: Secondary | ICD-10-CM | POA: Diagnosis not present

## 2024-06-28 DIAGNOSIS — E1122 Type 2 diabetes mellitus with diabetic chronic kidney disease: Secondary | ICD-10-CM | POA: Diagnosis present

## 2024-06-28 DIAGNOSIS — N10 Acute tubulo-interstitial nephritis: Secondary | ICD-10-CM | POA: Diagnosis not present

## 2024-06-28 DIAGNOSIS — N183 Chronic kidney disease, stage 3 unspecified: Secondary | ICD-10-CM | POA: Diagnosis not present

## 2024-06-28 DIAGNOSIS — C689 Malignant neoplasm of urinary organ, unspecified: Secondary | ICD-10-CM | POA: Diagnosis not present

## 2024-06-28 DIAGNOSIS — R7881 Bacteremia: Secondary | ICD-10-CM | POA: Diagnosis not present

## 2024-06-28 DIAGNOSIS — C770 Secondary and unspecified malignant neoplasm of lymph nodes of head, face and neck: Secondary | ICD-10-CM | POA: Diagnosis not present

## 2024-06-28 DIAGNOSIS — A4102 Sepsis due to Methicillin resistant Staphylococcus aureus: Secondary | ICD-10-CM | POA: Diagnosis not present

## 2024-06-28 DIAGNOSIS — J90 Pleural effusion, not elsewhere classified: Secondary | ICD-10-CM | POA: Diagnosis not present

## 2024-06-28 DIAGNOSIS — L89323 Pressure ulcer of left buttock, stage 3: Secondary | ICD-10-CM | POA: Diagnosis not present

## 2024-06-28 DIAGNOSIS — Y838 Other surgical procedures as the cause of abnormal reaction of the patient, or of later complication, without mention of misadventure at the time of the procedure: Secondary | ICD-10-CM | POA: Diagnosis present

## 2024-06-28 DIAGNOSIS — R6510 Systemic inflammatory response syndrome (SIRS) of non-infectious origin without acute organ dysfunction: Secondary | ICD-10-CM | POA: Diagnosis not present

## 2024-06-28 DIAGNOSIS — Y9301 Activity, walking, marching and hiking: Secondary | ICD-10-CM | POA: Diagnosis present

## 2024-06-28 DIAGNOSIS — W1830XA Fall on same level, unspecified, initial encounter: Secondary | ICD-10-CM | POA: Diagnosis present

## 2024-06-28 DIAGNOSIS — I472 Ventricular tachycardia, unspecified: Secondary | ICD-10-CM | POA: Diagnosis not present

## 2024-06-28 DIAGNOSIS — I071 Rheumatic tricuspid insufficiency: Secondary | ICD-10-CM | POA: Diagnosis not present

## 2024-06-28 DIAGNOSIS — M6281 Muscle weakness (generalized): Secondary | ICD-10-CM | POA: Diagnosis not present

## 2024-06-28 DIAGNOSIS — K760 Fatty (change of) liver, not elsewhere classified: Secondary | ICD-10-CM | POA: Diagnosis present

## 2024-06-28 DIAGNOSIS — N1832 Chronic kidney disease, stage 3b: Secondary | ICD-10-CM

## 2024-06-28 DIAGNOSIS — N135 Crossing vessel and stricture of ureter without hydronephrosis: Secondary | ICD-10-CM | POA: Diagnosis not present

## 2024-06-28 DIAGNOSIS — N184 Chronic kidney disease, stage 4 (severe): Secondary | ICD-10-CM | POA: Diagnosis present

## 2024-06-28 DIAGNOSIS — I110 Hypertensive heart disease with heart failure: Secondary | ICD-10-CM | POA: Diagnosis not present

## 2024-06-28 DIAGNOSIS — R2681 Unsteadiness on feet: Secondary | ICD-10-CM | POA: Diagnosis not present

## 2024-06-28 DIAGNOSIS — I2721 Secondary pulmonary arterial hypertension: Secondary | ICD-10-CM | POA: Diagnosis present

## 2024-06-28 DIAGNOSIS — G9341 Metabolic encephalopathy: Secondary | ICD-10-CM | POA: Diagnosis not present

## 2024-06-28 DIAGNOSIS — B9562 Methicillin resistant Staphylococcus aureus infection as the cause of diseases classified elsewhere: Secondary | ICD-10-CM | POA: Diagnosis not present

## 2024-06-28 DIAGNOSIS — R509 Fever, unspecified: Secondary | ICD-10-CM | POA: Diagnosis not present

## 2024-06-28 DIAGNOSIS — M4802 Spinal stenosis, cervical region: Secondary | ICD-10-CM | POA: Diagnosis not present

## 2024-06-28 DIAGNOSIS — C679 Malignant neoplasm of bladder, unspecified: Secondary | ICD-10-CM | POA: Diagnosis not present

## 2024-06-28 LAB — CBC WITH DIFFERENTIAL/PLATELET
Abs Immature Granulocytes: 0.3 K/uL — ABNORMAL HIGH (ref 0.00–0.07)
Basophils Absolute: 0.2 K/uL — ABNORMAL HIGH (ref 0.0–0.1)
Basophils Relative: 1 %
Eosinophils Absolute: 0 K/uL (ref 0.0–0.5)
Eosinophils Relative: 0 %
HCT: 38.2 % — ABNORMAL LOW (ref 39.0–52.0)
Hemoglobin: 11.6 g/dL — ABNORMAL LOW (ref 13.0–17.0)
Immature Granulocytes: 1 %
Lymphocytes Relative: 4 %
Lymphs Abs: 1.2 K/uL (ref 0.7–4.0)
MCH: 22.9 pg — ABNORMAL LOW (ref 26.0–34.0)
MCHC: 30.4 g/dL (ref 30.0–36.0)
MCV: 75.5 fL — ABNORMAL LOW (ref 80.0–100.0)
Monocytes Absolute: 2.5 K/uL — ABNORMAL HIGH (ref 0.1–1.0)
Monocytes Relative: 9 %
Neutro Abs: 23 K/uL — ABNORMAL HIGH (ref 1.7–7.7)
Neutrophils Relative %: 85 %
Platelets: 356 K/uL (ref 150–400)
RBC: 5.06 MIL/uL (ref 4.22–5.81)
RDW: 20.8 % — ABNORMAL HIGH (ref 11.5–15.5)
WBC: 27.2 K/uL — ABNORMAL HIGH (ref 4.0–10.5)
nRBC: 0 % (ref 0.0–0.2)

## 2024-06-28 LAB — BLOOD CULTURE ID PANEL (REFLEXED) - BCID2

## 2024-06-28 LAB — URINALYSIS, W/ REFLEX TO CULTURE (INFECTION SUSPECTED)
Bilirubin Urine: NEGATIVE
Glucose, UA: 50 mg/dL — AB
Ketones, ur: NEGATIVE mg/dL
Nitrite: NEGATIVE
Protein, ur: 100 mg/dL — AB
RBC / HPF: >50 RBC/hpf (ref 0–5)
Specific Gravity, Urine: 1.016 (ref 1.005–1.030)
WBC, UA: >50 WBC/hpf (ref 0–5)
pH: 6 (ref 5.0–8.0)

## 2024-06-28 LAB — COMPREHENSIVE METABOLIC PANEL WITH GFR
ALT: 19 U/L (ref 0–44)
AST: 28 U/L (ref 15–41)
Albumin: 2.2 g/dL — ABNORMAL LOW (ref 3.5–5.0)
Alkaline Phosphatase: 139 U/L — ABNORMAL HIGH (ref 38–126)
Anion gap: 12 (ref 5–15)
BUN: 32 mg/dL — ABNORMAL HIGH (ref 8–23)
CO2: 19 mmol/L — ABNORMAL LOW (ref 22–32)
Calcium: 8.5 mg/dL — ABNORMAL LOW (ref 8.9–10.3)
Chloride: 102 mmol/L (ref 98–111)
Creatinine, Ser: 1.96 mg/dL — ABNORMAL HIGH (ref 0.61–1.24)
GFR, Estimated: 35 mL/min — ABNORMAL LOW (ref >60)
Glucose, Bld: 208 mg/dL — ABNORMAL HIGH (ref 70–99)
Potassium: 4.2 mmol/L (ref 3.5–5.1)
Sodium: 133 mmol/L — ABNORMAL LOW (ref 135–145)
Total Bilirubin: 1.2 mg/dL (ref 0.0–1.2)
Total Protein: 7.9 g/dL (ref 6.5–8.1)

## 2024-06-28 LAB — GLUCOSE, CAPILLARY
Glucose-Capillary: 120 mg/dL — ABNORMAL HIGH (ref 70–99)
Glucose-Capillary: 140 mg/dL — ABNORMAL HIGH (ref 70–99)

## 2024-06-28 LAB — LACTIC ACID, PLASMA
Lactic Acid, Venous: 3.1 mmol/L (ref 0.5–1.9)
Lactic Acid, Venous: 2.7 mmol/L (ref 0.5–1.9)
Lactic Acid, Venous: 2.1 mmol/L (ref 0.5–1.9)

## 2024-06-28 LAB — PROTIME-INR
INR: 1.5 — ABNORMAL HIGH (ref 0.8–1.2)
Prothrombin Time: 18.6 s — ABNORMAL HIGH (ref 11.4–15.2)

## 2024-06-28 MED ORDER — ONDANSETRON HCL 4 MG PO TABS: 4.0000 mg | ORAL_TABLET | Freq: Four times a day (QID) | ORAL | Status: DC | PRN

## 2024-06-28 MED ORDER — OXYCODONE HCL 5 MG PO TABS
5.0000 mg | ORAL_TABLET | Freq: Once | ORAL | Status: AC
  Administered 2024-06-28: 5 mg via ORAL
  Filled 2024-06-28: qty 1

## 2024-06-28 MED ORDER — ONDANSETRON HCL 4 MG/2ML IJ SOLN
4.0000 mg | Freq: Four times a day (QID) | INTRAMUSCULAR | Status: DC | PRN
Start: 2024-06-28 — End: 2024-07-10
  Filled 2024-06-28 (×2): qty 2

## 2024-06-28 MED ORDER — SODIUM CHLORIDE 0.9 % IV BOLUS
500.0000 mL | Freq: Once | INTRAVENOUS | Status: AC
  Administered 2024-06-28: 500 mL via INTRAVENOUS

## 2024-06-28 MED ORDER — HYDROXYZINE HCL 10 MG PO TABS
25.0000 mg | ORAL_TABLET | Freq: Three times a day (TID) | ORAL | Status: DC | PRN
  Administered 2024-06-29: 25 mg via ORAL
  Filled 2024-06-28: qty 3

## 2024-06-28 MED ORDER — MEGESTROL ACETATE 625 MG/5ML PO SUSP: 625.0000 mg | Freq: Every day | ORAL | Status: DC

## 2024-06-28 MED ORDER — ACETAMINOPHEN 325 MG PO TABS
650.0000 mg | ORAL_TABLET | Freq: Four times a day (QID) | ORAL | Status: DC | PRN
  Administered 2024-07-01 – 2024-07-09 (×8): 650 mg via ORAL
  Filled 2024-06-28 (×9): qty 2

## 2024-06-28 MED ORDER — ASPIRIN 81 MG PO TBEC
81.0000 mg | DELAYED_RELEASE_TABLET | Freq: Every day | ORAL | Status: DC
  Administered 2024-06-28 – 2024-07-10 (×13): 81 mg via ORAL
  Filled 2024-06-28 (×13): qty 1

## 2024-06-28 MED ORDER — IRBESARTAN 150 MG PO TABS
150.0000 mg | ORAL_TABLET | Freq: Every day | ORAL | Status: DC
  Administered 2024-06-28 – 2024-06-29 (×2): 150 mg via ORAL
  Filled 2024-06-28 (×2): qty 1

## 2024-06-28 MED ORDER — OXYCODONE HCL 5 MG PO TABS: 5.0000 mg | ORAL_TABLET | ORAL | Status: DC | PRN

## 2024-06-28 MED ORDER — INSULIN ASPART 100 UNIT/ML IJ SOLN
0.0000 [IU] | Freq: Three times a day (TID) | INTRAMUSCULAR | Status: DC
  Administered 2024-06-29: 1 [IU] via SUBCUTANEOUS

## 2024-06-28 MED ORDER — SODIUM CHLORIDE 0.9 % IV BOLUS (SEPSIS)
1000.0000 mL | Freq: Once | INTRAVENOUS | Status: AC
  Administered 2024-06-28: 1000 mL via INTRAVENOUS

## 2024-06-28 MED ORDER — METOPROLOL TARTRATE 25 MG PO TABS
25.0000 mg | ORAL_TABLET | Freq: Two times a day (BID) | ORAL | Status: DC
  Administered 2024-06-28 – 2024-06-30 (×5): 25 mg via ORAL
  Filled 2024-06-28 (×5): qty 1

## 2024-06-28 MED ORDER — MEGESTROL ACETATE 400 MG/10ML PO SUSP
800.0000 mg | Freq: Every day | ORAL | Status: DC
  Administered 2024-06-28 – 2024-06-29 (×2): 800 mg via ORAL
  Filled 2024-06-28 (×2): qty 20

## 2024-06-28 MED ORDER — PIPERACILLIN-TAZOBACTAM 3.375 G IVPB 30 MIN
3.3750 g | Freq: Three times a day (TID) | INTRAVENOUS | Status: DC
  Administered 2024-06-28 – 2024-06-29 (×4): 3.375 g via INTRAVENOUS
  Filled 2024-06-28 (×5): qty 50

## 2024-06-28 MED ORDER — INSULIN ASPART 100 UNIT/ML IJ SOLN: 0.0000 [IU] | Freq: Every day | INTRAMUSCULAR | Status: DC

## 2024-06-28 MED ORDER — ACETAMINOPHEN 650 MG RE SUPP: 650.0000 mg | Freq: Four times a day (QID) | RECTAL | Status: DC | PRN

## 2024-06-28 MED ORDER — ISOSORBIDE MONONITRATE ER 30 MG PO TB24
30.0000 mg | ORAL_TABLET | Freq: Every day | ORAL | Status: DC
  Administered 2024-06-28 – 2024-06-30 (×3): 30 mg via ORAL
  Filled 2024-06-28 (×3): qty 1

## 2024-06-28 MED ORDER — ONDANSETRON HCL 4 MG/2ML IJ SOLN
4.0000 mg | Freq: Once | INTRAMUSCULAR | Status: AC
  Administered 2024-06-28: 4 mg via INTRAVENOUS
  Filled 2024-06-28: qty 2

## 2024-06-28 MED ORDER — ACETAMINOPHEN 325 MG PO TABS
650.0000 mg | ORAL_TABLET | Freq: Once | ORAL | Status: AC
  Administered 2024-06-28: 650 mg via ORAL
  Filled 2024-06-28: qty 2

## 2024-06-28 MED ORDER — ROSUVASTATIN CALCIUM 20 MG PO TABS
40.0000 mg | ORAL_TABLET | Freq: Every day | ORAL | Status: DC
  Administered 2024-06-28 – 2024-06-29 (×2): 40 mg via ORAL
  Filled 2024-06-28: qty 8
  Filled 2024-06-28 (×2): qty 2

## 2024-06-28 MED ORDER — MIRTAZAPINE 15 MG PO TABS
30.0000 mg | ORAL_TABLET | Freq: Every day | ORAL | Status: DC
  Administered 2024-06-28: 30 mg via ORAL
  Filled 2024-06-28: qty 2

## 2024-06-28 MED ORDER — VANCOMYCIN HCL IN DEXTROSE 1-5 GM/200ML-% IV SOLN: 1000.0000 mg | INTRAVENOUS | Status: DC

## 2024-06-28 MED ORDER — SODIUM BICARBONATE 650 MG PO TABS
650.0000 mg | ORAL_TABLET | Freq: Two times a day (BID) | ORAL | Status: DC
  Administered 2024-06-28 – 2024-07-10 (×24): 650 mg via ORAL
  Filled 2024-06-28 (×25): qty 1

## 2024-06-28 MED ORDER — POLYVINYL ALCOHOL 1.4 % OP SOLN
2.0000 [drp] | Freq: Four times a day (QID) | OPHTHALMIC | Status: DC
  Administered 2024-06-28 – 2024-07-10 (×36): 2 [drp] via OPHTHALMIC
  Filled 2024-06-28 (×2): qty 15

## 2024-06-28 MED ORDER — VANCOMYCIN HCL 2000 MG/400ML IV SOLN
2000.0000 mg | Freq: Once | INTRAVENOUS | Status: AC
  Administered 2024-06-28: 2000 mg via INTRAVENOUS
  Filled 2024-06-28: qty 400

## 2024-06-28 MED ORDER — LINACLOTIDE 145 MCG PO CAPS
145.0000 ug | ORAL_CAPSULE | Freq: Every day | ORAL | Status: DC
  Administered 2024-06-28 – 2024-07-10 (×12): 145 ug via ORAL
  Filled 2024-06-28 (×14): qty 1

## 2024-06-28 MED ORDER — ALBUTEROL SULFATE (2.5 MG/3ML) 0.083% IN NEBU
2.5000 mg | INHALATION_SOLUTION | Freq: Four times a day (QID) | RESPIRATORY_TRACT | Status: DC | PRN
  Administered 2024-07-09: 2.5 mg via RESPIRATORY_TRACT
  Filled 2024-06-28: qty 3

## 2024-06-28 MED ORDER — ENSURE PLUS HIGH PROTEIN PO LIQD
237.0000 mL | Freq: Two times a day (BID) | ORAL | Status: DC
  Administered 2024-06-29 – 2024-07-10 (×17): 237 mL via ORAL
  Filled 2024-06-28 (×2): qty 237

## 2024-06-28 MED ORDER — PIPERACILLIN-TAZOBACTAM 3.375 G IVPB 30 MIN
3.3750 g | Freq: Once | INTRAVENOUS | Status: AC
  Administered 2024-06-28: 3.375 g via INTRAVENOUS
  Filled 2024-06-28: qty 50

## 2024-06-28 MED ORDER — SODIUM CHLORIDE 0.9 % IV SOLN: INTRAVENOUS | Status: DC

## 2024-06-28 MED ORDER — POLYETHYLENE GLYCOL 3350 17 G PO PACK
17.0000 g | PACK | Freq: Every day | ORAL | Status: DC | PRN
  Administered 2024-07-07 – 2024-07-09 (×2): 17 g via ORAL
  Filled 2024-06-28 (×2): qty 1

## 2024-06-28 MED ORDER — SODIUM CHLORIDE 0.9% FLUSH
3.0000 mL | Freq: Two times a day (BID) | INTRAVENOUS | Status: DC
  Administered 2024-06-28 – 2024-07-10 (×24): 3 mL via INTRAVENOUS

## 2024-06-28 MED ORDER — ENOXAPARIN SODIUM 40 MG/0.4ML IJ SOSY
40.0000 mg | PREFILLED_SYRINGE | INTRAMUSCULAR | Status: DC
  Administered 2024-06-28 – 2024-07-08 (×11): 40 mg via SUBCUTANEOUS
  Filled 2024-06-28 (×11): qty 0.4

## 2024-06-28 MED ORDER — MEGESTROL ACETATE 400 MG/10ML PO SUSP
625.0000 mg | Freq: Every day | ORAL | Status: DC
  Filled 2024-06-28 (×2): qty 20

## 2024-06-28 NOTE — ED Notes (Signed)
 Griselda MD notified of pt axillary temp 101.6, ice packs applied per verbal order.

## 2024-06-28 NOTE — Progress Notes (Signed)
 PHARMACY - PHYSICIAN COMMUNICATION CRITICAL VALUE ALERT - BLOOD CULTURE IDENTIFICATION (BCID)  Adam Harris is an 78 y.o. male who presented to [facility] on 06/27/2024 with a chief complaint of fall and UTI  Assessment:  infection (include suspected source if known)  Name of physician (or Provider) Contacted: Lynwood Kipper NP  Current antibiotics: Zosyn   Changes to prescribed antibiotics recommended:  Recommendations accepted by provider  Results for orders placed or performed during the hospital encounter of 06/27/24  Blood Culture ID Panel (Reflexed) (Collected: 06/28/2024  2:25 AM)  Result Value Ref Range   Enterococcus faecalis NOT DETECTED NOT DETECTED   Enterococcus Faecium NOT DETECTED NOT DETECTED   Listeria monocytogenes NOT DETECTED NOT DETECTED   Staphylococcus species DETECTED (A) NOT DETECTED   Staphylococcus aureus (BCID) DETECTED (A) NOT DETECTED   Staphylococcus epidermidis NOT DETECTED NOT DETECTED   Staphylococcus lugdunensis NOT DETECTED NOT DETECTED   Streptococcus species NOT DETECTED NOT DETECTED   Streptococcus agalactiae NOT DETECTED NOT DETECTED   Streptococcus pneumoniae NOT DETECTED NOT DETECTED   Streptococcus pyogenes NOT DETECTED NOT DETECTED   A.calcoaceticus-baumannii NOT DETECTED NOT DETECTED   Bacteroides fragilis NOT DETECTED NOT DETECTED   Enterobacterales NOT DETECTED NOT DETECTED   Enterobacter cloacae complex NOT DETECTED NOT DETECTED   Escherichia coli NOT DETECTED NOT DETECTED   Klebsiella aerogenes NOT DETECTED NOT DETECTED   Klebsiella oxytoca NOT DETECTED NOT DETECTED   Klebsiella pneumoniae NOT DETECTED NOT DETECTED   Proteus species NOT DETECTED NOT DETECTED   Salmonella species NOT DETECTED NOT DETECTED   Serratia marcescens NOT DETECTED NOT DETECTED   Haemophilus influenzae NOT DETECTED NOT DETECTED   Neisseria meningitidis NOT DETECTED NOT DETECTED   Pseudomonas aeruginosa NOT DETECTED NOT DETECTED   Stenotrophomonas maltophilia  NOT DETECTED NOT DETECTED   Candida albicans NOT DETECTED NOT DETECTED   Candida auris NOT DETECTED NOT DETECTED   Candida glabrata NOT DETECTED NOT DETECTED   Candida krusei NOT DETECTED NOT DETECTED   Candida parapsilosis NOT DETECTED NOT DETECTED   Candida tropicalis NOT DETECTED NOT DETECTED   Cryptococcus neoformans/gattii NOT DETECTED NOT DETECTED   Meth resistant mecA/C and MREJ DETECTED (A) NOT DETECTED    Ozell ONEIDA [place] 06/28/2024  7:11 PM

## 2024-06-28 NOTE — ED Provider Notes (Signed)
 [facility] AT [facility] Provider Note   CSN: [id number] Arrival date & time: 06/27/24  2358     Patient presents with: Adam   Adam Harris is a 78 y.o. male.   The history is provided by the patient and medical records.  Fall  Adam Harris is a 78 y.o. male who presents to the Emergency Department complaining of fall. He presents the emergency department for evaluation following a fall. Patient does not recall all events and the majority of the history is obtained by his wife at the bedside. She states that today he went to two doctors appointments in the morning and at home he did not want to get out of the car so he sat in the car with the [facility] running for about an hour and a half. When she came back out of the house to check on him he was outside the vehicle and sitting on the running board. He had turned up the heat the vehicle to 84 and he seemed confused and he fell to the ground and required multiple people in multiple attempts to get him back up due to being so weak. This occurred around 8 PM. Family was eventually able to get him back in the home but he started to request hospital valuation due to feeling unwell. No reported fevers. Patient denies any pain. He did have enough ostomy to exchange earlier today.  He has a history of coronary artery disease, diabetes, a fib status post watchmen procedure, CHF, metastatic urothelial carcinoma, CKD.  Prior to Admission medications   Medication Sig Start Date End Date Taking? Authorizing Provider  aspirin  EC 81 MG tablet Take 1 tablet (81 mg total) by mouth daily. Swallow whole. 03/13/24  Yes [name], MD  Carboxymethylcell-Glycerin  PF 0.5-0.9 % SOLN Place 2 drops into both eyes 4 (four) times daily. 06/04/24  Yes [name], MD  isosorbide  mononitrate (IMDUR ) 30 MG 24 hr tablet Take 1 tablet (30 mg total) by mouth daily. 06/06/24  Yes Cherlyn Labella, MD  linaclotide  (LINZESS ) 145 MCG CAPS capsule Take 1 capsule  (145 mcg total) by mouth daily before breakfast. 06/19/24  Yes [name], MD  megestrol  ([name]  ES) 625 MG/5ML suspension Take 5 mLs (625 mg total) by mouth daily. 06/12/24  Yes [name], MD  metoprolol  tartrate (LOPRESSOR ) 25 MG tablet TAKE ONE TABLET BY MOUTH TWICE A DAY 06/11/23  Yes [name], MD  mirtazapine  (REMERON ) 30 MG tablet Take 1 tablet (30 mg total) by mouth at bedtime. 06/12/24  Yes [name], MD  olmesartan  (BENICAR ) 20 MG tablet Take 20 mg by mouth daily. 05/13/24  Yes [provider]  ondansetron  (ZOFRAN ) 8 MG tablet Take 1 tablet (8 mg total) by mouth every 8 (eight) hours as needed for nausea or vomiting. 06/04/24  Yes [name], MD  oxyCODONE  (OXY IR/ROXICODONE ) 5 MG immediate release tablet Take 1-2 tablets (5-10 mg total) by mouth every 4 (four) hours as needed for severe pain (pain score 7-10). 07/02/24  Yes Pickenpack-Cousar, Fannie SAILOR, NP  Pembrolizumab  (KEYTRUDA  IV) Inject into the vein every 14 (fourteen) days.   Yes [provider]  polyethylene glycol (MIRALAX  / GLYCOLAX ) 17 g packet Take 17 g by mouth daily as needed for mild constipation. 06/03/24  Yes Akula, Vijaya, MD  prochlorperazine  (COMPAZINE ) 10 MG tablet Take 1 tablet (10 mg total) by mouth every 6 (six) hours as needed for nausea or vomiting. 06/04/24  Yes [name], MD  rosuvastatin  (CRESTOR ) 40 MG tablet TAKE 1 TABLET (40 MG TOTAL) BY MOUTH EVERY MORNING. PLEASE KEEP SCHEDULED APPOINTMENT 08/24/23  Yes [name], MD  sitaGLIPtin  (JANUVIA ) 50 MG tablet Take 1 tablet (50 mg total) by mouth daily. 05/19/24  Yes [name], MD  sodium bicarbonate  650 MG tablet Take 650 mg by mouth 2 (two) times daily.   Yes [provider]  hydrOXYzine  (VISTARIL ) 25 MG capsule Take 1 capsule (25 mg total) by mouth every 8 (eight) hours as needed. 05/11/23   [name], MD  lidocaine -prilocaine  (EMLA ) cream Apply to affected area once Patient not taking: Reported on 06/28/2024 06/04/24    [name], MD  nitroGLYCERIN  (NITROSTAT ) 0.4 MG SL tablet TAKE ONE TABLET UNDER THE TONGUE EVERY 5 MINS FOR THREE DOSES AS NEEDED FOR CHEST PAIN. CALL 911 IF 2ND DOSE DOESN'T HELP Patient not taking: Reported on 06/28/2024 04/10/24   [name], MD  tamsulosin  (FLOMAX ) 0.4 MG CAPS capsule TAKE ONE CAPSULE BY MOUTH DAILY Patient not taking: Reported on 06/28/2024 02/22/24   [name], MD  traZODone  (DESYREL ) 50 MG tablet Take 0.5-1 tablets (25-50 mg total) by mouth at bedtime as needed for sleep. Patient not taking: Reported on 06/28/2024 10/16/23   [name], MD    Allergies: Xarelto  [rivaroxaban ]    Review of Systems  All other systems reviewed and are negative.   Updated Vital Signs BP (!) 151/111   Pulse (!) 115   Temp 98.7 F (37.1 C) (Oral)   Resp 18   Ht 5' 11 (1.803 m)   Wt 89.8 kg   SpO2 96%   BMI 27.62 kg/m   Physical Exam Vitals and nursing note reviewed.  Constitutional:      Appearance: He is well-developed.  HENT:     Head: Normocephalic.     Comments: Right periorbital ecchymosis Cardiovascular:     Rate and Rhythm: Regular rhythm. Tachycardia present.     Heart sounds: No murmur heard. Pulmonary:     Effort: Pulmonary effort is normal. No respiratory distress.     Breath sounds: Normal breath sounds.  Abdominal:     Palpations: Abdomen is soft.     Tenderness: There is no abdominal tenderness. There is no guarding or rebound.     Comments: Left flank with an approximate dressing in place was clean, dry, intact  Musculoskeletal:        General: No tenderness.  Skin:    General: Skin is warm and dry.  Neurological:     Mental Status: He is alert and oriented to person, place, and time.     Comments: Global weakness. He is oriented to person place and time but confused regarding recent events. He is slow to answer questions.  Psychiatric:        Behavior: Behavior normal.     (all labs ordered are listed, but only abnormal results are  displayed) Labs Reviewed  COMPREHENSIVE METABOLIC PANEL WITH GFR - Abnormal; Notable for the following components:      Result Value   Sodium 133 (*)    CO2 19 (*)    Glucose, Bld 208 (*)    BUN 32 (*)    Creatinine, Ser 1.96 (*)    Calcium  8.5 (*)    Albumin  2.2 (*)    Alkaline Phosphatase 139 (*)    GFR, Estimated 35 (*)    All other components within normal limits  CBC WITH DIFFERENTIAL/PLATELET - Abnormal; Notable for the following  components:   WBC 27.2 (*)    Hemoglobin 11.6 (*)    HCT 38.2 (*)    MCV 75.5 (*)    MCH 22.9 (*)    RDW 20.8 (*)    Neutro Abs 23.0 (*)    Monocytes Absolute 2.5 (*)    Basophils Absolute 0.2 (*)    Abs Immature Granulocytes 0.30 (*)    All other components within normal limits  PROTIME-INR - Abnormal; Notable for the following components:   Prothrombin Time 18.6 (*)    INR 1.5 (*)    All other components within normal limits  URINALYSIS, W/ REFLEX TO CULTURE (INFECTION SUSPECTED) - Abnormal; Notable for the following components:   APPearance HAZY (*)    Glucose, UA 50 (*)    Hgb urine dipstick LARGE (*)    Protein, ur 100 (*)    Leukocytes,Ua LARGE (*)    Bacteria, UA FEW (*)    All other components within normal limits  CULTURE, BLOOD (ROUTINE X 2)  CULTURE, BLOOD (ROUTINE X 2)  URINE CULTURE  I-STAT CG4 LACTIC ACID, ED  I-STAT CHEM 8, ED  I-STAT CG4 LACTIC ACID, ED    EKG: None  Radiology: CT Cervical Spine Wo Contrast Result Date: 06/28/2024 CLINICAL DATA:  Fall EXAM: CT CERVICAL SPINE WITHOUT CONTRAST TECHNIQUE: Multidetector CT imaging of the cervical spine was performed without intravenous contrast. Multiplanar CT image reconstructions were also generated. RADIATION DOSE REDUCTION: This exam was performed according to the departmental dose-optimization program which includes automated exposure control, adjustment of the mA and/or kV according to patient size and/or use of iterative reconstruction technique. COMPARISON:  None  Available. FINDINGS: Alignment: No subluxation Skull base and vertebrae: No acute fracture. No primary bone lesion or focal pathologic process. Soft tissues and spinal canal: No prevertebral fluid or swelling. No visible canal hematoma. Disc levels: Moderate degenerative disc disease with disc space narrowing and spurring. Advanced degenerative facet disease, left greater than right. Multilevel bilateral neural foraminal narrowing. Upper chest: No acute findings Other: None IMPRESSION: Moderate to advanced degenerative changes as above. No acute bony abnormality. Electronically Signed   By: Franky Crease M.D.   On: 06/28/2024 01:06   CT Head Wo Contrast Result Date: 06/28/2024 CLINICAL DATA:  Fall EXAM: CT HEAD WITHOUT CONTRAST TECHNIQUE: Contiguous axial images were obtained from the base of the skull through the vertex without intravenous contrast. RADIATION DOSE REDUCTION: This exam was performed according to the departmental dose-optimization program which includes automated exposure control, adjustment of the mA and/or kV according to patient size and/or use of iterative reconstruction technique. COMPARISON:  05/25/2024 FINDINGS: Brain: No acute intracranial abnormality. Specifically, no hemorrhage, hydrocephalus, mass lesion, acute infarction, or significant intracranial injury. Vascular: No hyperdense vessel or unexpected calcification. Skull: No acute calvarial abnormality. Sinuses/Orbits: No acute findings Other: None IMPRESSION: No acute intracranial abnormality. Electronically Signed   By: Franky Crease M.D.   On: 06/28/2024 01:04   DG Chest Port 1 View Result Date: 06/28/2024 CLINICAL DATA:  Questionable sepsis.  Fall. EXAM: PORTABLE CHEST 1 VIEW COMPARISON:  11/29/2023 FINDINGS: Heart and mediastinal contours are within normal limits. No confluent airspace opacities or effusions. No acute bony abnormality. IMPRESSION: No active disease. Electronically Signed   By: Franky Crease M.D.   On: 06/28/2024  00:57   IR NEPHROSTOMY EXCHANGE LEFT Result Date: 06/27/2024 INDICATION: Indwelling left percutaneous nephrostomy tube. 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 EXAM: LEFT PERCUTANEOUS NEPHROSTOMY TUBE EXCHANGE UNDER FLUOROSCOPY  INCLUDING NEPHROSTOGRAM COMPARISON:  None Available. MEDICATIONS: None ANESTHESIA/SEDATION: None CONTRAST:  30 mL Omnipaque  300-administered into the collecting system(s) FLUOROSCOPY: Radiation Exposure Index (as provided by the fluoroscopic device): 53 mGy Kerma COMPLICATIONS: None immediate. PROCEDURE: Informed written consent was obtained from the patient after a thorough discussion of the procedural risks, benefits and alternatives. All questions were addressed. Maximal Sterile Barrier Technique was utilized including caps, mask, sterile gowns, sterile gloves, sterile drape, hand hygiene and skin antiseptic. A timeout was performed prior to the initiation of the procedure. Contrast was injected via the pre-existing left nephrostomy tube and fluoroscopic imaging safe. The nephrostomy tube was cut and removed over a guidewire. A 5 [place] Kumpe catheter was advanced into the left ureter and additional contrast injection performed and imaging performed of the entire left ureter into the pelvis. A new 10 French nephrostomy tube was then advanced over the wire and formed at the level of the left renal pelvis. A fluoroscopic image was saved to confirm tube position. The tube was then connected to a new gravity drainage bag and secured at the exit site with a Prolene retention suture and StatLock device. FINDINGS: Nephrostogram demonstrates poor antegrade flow of contrast down the ureter. Additional catheter injection of the left ureter shows ureteral dilatation and evidence of high-grade ureteral obstruction in the pelvis just above the UVJ. A guidewire would not cross into the bladder lumen. The replaced nephrostomy tube will be left to gravity  drainage. IMPRESSION: Left percutaneous nephrostomy tube exchange under fluoroscopy. Detailed nephrostogram performed today including evaluation of the entire ureter demonstrates high-grade obstruction of the distal ureter at the ureterovesical junction. This could not be crossed with a guidewire and the nephrostomy tube was replaced and will be left to gravity bag drainage. Electronically Signed   By: Marcey Moan M.D.   On: 06/27/2024 16:45     Procedures   Medications Ordered in the ED  sodium chloride  0.9 % bolus 1,000 mL (0 mLs Intravenous Stopped 06/28/24 0252)  acetaminophen  (TYLENOL ) tablet 650 mg (650 mg Oral Given 06/28/24 0139)  ondansetron  (ZOFRAN ) injection 4 mg (4 mg Intravenous Given 06/28/24 0136)  piperacillin -tazobactam (ZOSYN ) IVPB 3.375 g (0 g Intravenous Stopped 06/28/24 0345)  oxyCODONE  (Oxy IR/ROXICODONE ) immediate release tablet 5 mg (5 mg Oral Given 06/28/24 0344)  sodium chloride  0.9 % bolus 500 mL (500 mLs Intravenous New Bag/Given 06/28/24 0559)                                    Medical Decision Making Amount and/or Complexity of Data Reviewed Labs: ordered. Radiology: ordered.  Risk OTC drugs. Prescription drug management. Decision regarding hospitalization.   Patient here for evaluation following multiple falls today, confusion. Family reports that there has been confusion lately, more persistent today. He is ill appearing on evaluation, warm to touch with no focal zero deficits. He was found to have a core temperature of 101.8 on ED arrival. Unclear if this is secondary to environmental exposure versus infection. Sepsis protocol was obtained, he does have elevated lactic acid. CBC was leukocytosis although he has a baseline leukocytosis. UA challenging to interpret for infection given he has approximately two emplace. He was started on empiric antibiotics for possible infection pending ongoing workup. CT head and C-spine are negative for acute traumatic  injury. Patient continues to be confused, continues to have elevated temperatures. Plan to admit for IV antibiotics and ongoing care. Wife and  daughter updated FISA studies and they are in agreement with admission for ongoing care. Hospitalist consulted for admission.     Final diagnoses:  Delirium  Fever, unspecified fever cause    ED Discharge Orders     None          Griselda Norris, MD 06/28/24 [phone]

## 2024-06-28 NOTE — H&P (Addendum)
 History and Physical    Patient: Adam Harris DOB: Sep 02, 1946 DOA: 06/27/2024 DOS: the patient was seen and examined on 06/28/2024 PCP: [name], MD  Patient coming from: Home  Chief Complaint:  Chief Complaint  Patient presents with   Fall   HPI: Adam Harris is a 78 y.o. 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  He experienced confusion and overheating yesterday following two doctor's appointments, one of which involved a nephrostomy replacement.  Review of records from interventional radiology note that the patient had a high-grade obstruction of the distal ureter at the ureterovesical junction.  This could not be crossed with a guidewire and the nephrostomy tube was replaced and left to gravity bag drainage.  After getting back home he was not able to get out of the car.  In the car with the [facility] running,  but after an hour of him not coming in the house his wife went back to check on him and found him outside sitting on the car.  It is reported that he was confused and fell and it required several people to get him up due to him being so weak.  Patient admits to having significant weight loss over the last several weeks.  He has a bruise under his right eye, attributed to a fall while walking.  He is not able to tell me whether this happened yesterday or some other time.  He uses a walker for mobility due unsteadiness and did not bring his dentures to the hospital, requesting a soft diet instead.  He is being followed in outpatient setting by Dr. Lanny and had been receiving Keytruda  and Padcev .  He had also recently been seen by palliative care.  Upon  admission into the emergency department patient was noted to be febrile up to 101.8 F with tachycardia and tachypnea.  Blood pressures elevated up to 170/90, and O2 saturations maintained on room air.  Labs today revealed WBC 27.2, hemoglobin 11.6, sodium 133, BUN 32, creatinine 1.96, glucose 208, calcium  8.5, alkaline phosphatase 139, and albumin  2.2.  Chest x-ray and CT scan of the head noted no acute abnormality.  CT scan of the cervical spine noted moderate to advanced degenerative changes without acute bony abnormality.  Urinalysis noted large hemoglobin, large leukocytes, few bacteria, greater than 50 RBCs/hpf, and greater than 50 WBCs.  Blood and urine cultures have been obtained.  He has been given Tylenol , 1.5 L of IV fluids, oxycodone , Zofran , and Zosyn .  Review of Systems: As mentioned in the history of present illness. All other systems reviewed and are negative. Past Medical History:  Diagnosis Date   Abnormal radiologic findings on diagnostic imaging of renal pelvis, ureter, or bladder    bilateral ureter abnormalities   Anticoagulant long-term use    eliquis    Anxiety    pt denies   Arthritis    Atrial fibrillation, chronic (HCC)    CAD (coronary artery disease) cardiologist-- dr hochrein   NSTEMI 02-04-2014  per cardiac cath chronic occluded RCA w/ faint left-to-right collaterals and aneurysmal LCFx with sluggish coronary flow/   NSTEMI --11-21-2016 per cardiac cath occluded proximal RCA & mid to diastal CFX 100%, med rx. If that does not work, PTCA or CABG   Cancer (HCC)  bladder and kidney   CHF (congestive heart failure) (HCC)    CKD (chronic kidney disease), stage III (HCC)    patient unaware   DOE (dyspnea on exertion)    Fatty liver    pt denies   Hematuria 02/2019   History of COVID-19 10/2019   History of non-ST elevation myocardial infarction (NSTEMI)    02-04-2014  and 11-21-2016  cardiac cath done both times ,  medically management   History of shingles  12/2017   slight pain and numbness still noted in the area   Hyperlipidemia    Hypertension    Insomnia    Myocardial infarction [facility] 2015   Persistent atrial fibrillation [facility])    cardiologsit-- dr hochrein   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 Kennyth   Thoracic aortic atherosclerosis (HCC)    Type 2 diabetes mellitus (HCC)    Urinary frequency    Past Surgical History:  Procedure Laterality Date   CARDIAC CATHETERIZATION N/A 11/21/2016   Procedure: Left Heart Cath and Coronary Angiography;  Surgeon: Debby DELENA Sor, MD;  Location: [facility] INVASIVE CV LAB;  Service: Cardiovascular;  Laterality: N/A;  pRCA 100% , ostial LAD 45%, OM3 80%, mCFX to dCFX 100% (AV groove), lateral OM3 50%   COLONOSCOPY     CYSTOSCOPY WITH BIOPSY N/A 08/12/2020   Procedure: CYSTOSCOPY WITH BLADDER BIOPSY AND TRANSURETHRAL RESECTION OF BLADDER TUMOR;  Surgeon: [name], MD;  Location: [facility];  Service: Urology;  Laterality: N/A;   CYSTOSCOPY WITH BIOPSY N/A 11/10/2021   Procedure: CYSTOSCOPY WITH BLADDER BIOPSIES/ LEFT RETROGRADE;  Surgeon: [name], MD;  Location: [facility];  Service: Urology;  Laterality: N/A;   CYSTOSCOPY WITH BIOPSY Left 07/27/2022   Procedure: CYSTOSCOPY WITH  BLADDER BIOPSY, TRANSURETHRAL FULGERATION OF BLADDER, EXAM UNDER ANESTHESIA, LEFT RETROGRADE PYELOGRAM;  Surgeon: [name], MD;  Location: [facility];  Service: Urology;  Laterality: Left;   CYSTOSCOPY WITH BIOPSY N/A 04/23/2023   Procedure: CYSTOSCOPY WITH BLADDER AND PROSTATIC URETHRAL BIOPSIES;  Surgeon: [name], MD;  Location: [facility];  Service: Urology;  Laterality: N/A;  60 MINUTES NEEDED FOR CASE   CYSTOSCOPY WITH BIOPSY N/A 03/06/2024   Procedure: CYSTOSCOPY, WITH BIOPSY;  Surgeon: [name], MD;  Location: [facility];  Service: Urology;  Laterality: N/A;  CYSTOSCOPY WITH BLADDER BIOPSIES   CYSTOSCOPY WITH FULGERATION N/A 03/06/2024   Procedure:  CYSTOSCOPY, WITH BLADDER FULGURATION, TURBT;  Surgeon: [name], MD;  Location: [facility];  Service: Urology;  Laterality: N/A;   CYSTOSCOPY WITH URETEROSCOPY AND STENT PLACEMENT Right 12/15/2019   Procedure: CYSTOSCOPY WITH RIGHT RETROGRADE/ RIGHT URETEROSCOPY/ BIOPSY;  Surgeon: Ottelin, Mark, MD;  Location[facility];  Service: Urology;  Laterality: Right;   CYSTOSCOPY/RETROGRADE/URETEROSCOPY Bilateral 03/18/2018   Procedure: CYSTOSCOPY/RETROGRADE/URETEROSCOPY.;  Surgeon: Ottelin, Mark, MD;  Location: [facility];  Service: Urology;  Laterality: Bilateral;   EYE SURGERY Bilateral    cateract in January 2023   HYDROCELE EXCISION Left 07/27/2022   Procedure: HYDROCELE REPAIR;  Surgeon: [name], MD;  Location: [facility];  Service: Urology;  Laterality: Left;  GENERAL ANESTHESIA WITH PARALYSIS   IR FLUORO PROCEDURE UNLISTED  06/13/2024   IR NEPHROSTOMY EXCHANGE LEFT  03/10/2024   IR NEPHROSTOMY EXCHANGE LEFT  04/28/2024   IR NEPHROSTOMY EXCHANGE LEFT  06/27/2024   IR NEPHROSTOMY PLACEMENT LEFT  03/07/2024   KNEE ARTHROSCOPY Right    LEFT ATRIAL APPENDAGE OCCLUSION N/A 12/13/2023  Procedure: LEFT ATRIAL APPENDAGE OCCLUSION;  Surgeon: [name], MD;  Location: [facility] INVASIVE CV LAB;  Service: Cardiovascular;  Laterality: N/A;   LEFT HEART CATHETERIZATION WITH CORONARY ANGIOGRAM N/A 02/04/2014   Procedure: LEFT HEART CATHETERIZATION WITH CORONARY ANGIOGRAM;  Surgeon: Deatrice DELENA Cage, MD;  Location: [facility] CATH [facility];  Service: Cardiovascular;  Laterality: N/A;  severe one-vessel CAD, chronically occluded RCA with faint left-to-right collaterals;  aneurysmal LCFx with sluggish coronary flow;  normal LVSF w/ moderately elevated LVEDP (ostialOM2 20%, pOM3 20%, pD1 20%, mCFX 50%, diffuse 20% pCFX)   PROSTATE BIOPSY N/A 08/12/2020   Procedure: BIOPSY TRANSRECTAL ULTRASONIC PROSTATE (TUBP);  Surgeon: [name], MD;  Location: [facility];  Service: Urology;  Laterality: N/A;    ROBOT ASSITED LAPAROSCOPIC NEPHROURETERECTOMY Right 03/22/2020   Procedure: XI ROBOT ASSITED LAPAROSCOPIC NEPHROURETERECTOMY;  Surgeon: [name], MD;  Location: [facility];  Service: Urology;  Laterality: Right;   TRANSESOPHAGEAL ECHOCARDIOGRAM (CATH LAB) N/A 12/13/2023   Procedure: TRANSESOPHAGEAL ECHOCARDIOGRAM;  Surgeon: [name], MD;  Location: [facility] INVASIVE CV LAB;  Service: Cardiovascular;  Laterality: N/A;   TRANSESOPHAGEAL ECHOCARDIOGRAM (CATH LAB) N/A 02/11/2024   Procedure: TRANSESOPHAGEAL ECHOCARDIOGRAM;  Surgeon: Barbaraann Darryle Ned, MD;  Location: [facility] INVASIVE CV LAB;  Service: Cardiovascular;  Laterality: N/A;   TRANSTHORACIC ECHOCARDIOGRAM  11-22-2016   dr hochrein   ef 55-60%/ mild MR and TR/ moderate LAE   TRANSURETHRAL RESECTION OF BLADDER TUMOR N/A 02/10/2021   Procedure: TRANSURETHRAL RESECTION OF BLADDER TUMOR (TURBT)/ CYSTOSCOPY/ LEFT RETROGRADE;  Surgeon: [name], MD;  Location: [facility];  Service: Urology;  Laterality: N/A;  GENERAL ANESTHESIA WITH PARALYSIS   Social History:  reports that he quit smoking about 53 years ago. His smoking use included cigarettes. He started smoking about 57 years ago. He has a 1 pack-year smoking history. He has been exposed to tobacco smoke. He quit smokeless tobacco use about 53 years ago.  His smokeless tobacco use included chew. He reports that he does not drink alcohol  and does not use drugs.  Allergies  Allergen Reactions   Xarelto  [Rivaroxaban ] Other (See Comments)    Skin Blisters     Family History  Problem Relation Age of Onset   Hypertension Mother    Cancer Mother        anal cancer   Esophageal cancer Neg Hx    Pancreatic cancer Neg Hx    Stomach cancer Neg Hx    Liver disease Neg Hx     Prior to Admission medications   Medication Sig Start Date End Date Taking? Authorizing Provider  aspirin  EC 81 MG tablet Take 1 tablet (81 mg total) by mouth daily. Swallow whole. 03/13/24  Yes [name], MD   Carboxymethylcell-Glycerin  PF 0.5-0.9 % SOLN Place 2 drops into both eyes 4 (four) times daily. 06/04/24  Yes [name], MD  isosorbide  mononitrate (IMDUR ) 30 MG 24 hr tablet Take 1 tablet (30 mg total) by mouth daily. 06/06/24  Yes Cherlyn Labella, MD  linaclotide  (LINZESS ) 145 MCG CAPS capsule Take 1 capsule (145 mcg total) by mouth daily before breakfast. 06/19/24  Yes [name], MD  megestrol  ([name]  ES) 625 MG/5ML suspension Take 5 mLs (625 mg total) by mouth daily. 06/12/24  Yes [name], MD  metoprolol  tartrate (LOPRESSOR ) 25 MG tablet TAKE ONE TABLET BY MOUTH TWICE A DAY 06/11/23  Yes [name], MD  mirtazapine  (REMERON ) 30 MG tablet Take 1 tablet (30 mg total) by mouth at bedtime. 06/12/24  Yes [name], MD  olmesartan  (BENICAR ) 20 MG tablet Take 20 mg by mouth daily. 05/13/24  Yes [provider]  ondansetron  (ZOFRAN ) 8 MG tablet Take 1 tablet (8 mg total) by mouth every 8 (eight) hours as needed for nausea or vomiting. 06/04/24  Yes [name], MD  oxyCODONE  (OXY IR/ROXICODONE ) 5 MG immediate release tablet Take 1-2 tablets (5-10 mg total) by mouth every 4 (four) hours as needed for severe pain (pain score 7-10). 07/02/24  Yes Pickenpack-Cousar, Fannie SAILOR, NP  Pembrolizumab  (KEYTRUDA  IV) Inject into the vein every 14 (fourteen) days.   Yes [provider]  polyethylene glycol (MIRALAX  / GLYCOLAX ) 17 g packet Take 17 g by mouth daily as needed for mild constipation. 06/03/24  Yes Akula, Vijaya, MD  prochlorperazine  (COMPAZINE ) 10 MG tablet Take 1 tablet (10 mg total) by mouth every 6 (six) hours as needed for nausea or vomiting. 06/04/24  Yes [name], MD  rosuvastatin  (CRESTOR ) 40 MG tablet TAKE 1 TABLET (40 MG TOTAL) BY MOUTH EVERY MORNING. PLEASE KEEP SCHEDULED APPOINTMENT 08/24/23  Yes [name], MD  sitaGLIPtin  (JANUVIA ) 50 MG tablet Take 1 tablet (50 mg total) by mouth daily. 05/19/24  Yes [name], MD  sodium bicarbonate  650 MG tablet Take 650 mg by mouth 2  (two) times daily.   Yes [provider]  hydrOXYzine  (VISTARIL ) 25 MG capsule Take 1 capsule (25 mg total) by mouth every 8 (eight) hours as needed. 05/11/23   [name], MD  lidocaine -prilocaine  (EMLA ) cream Apply to affected area once Patient not taking: Reported on 06/28/2024 06/04/24   [name], MD  nitroGLYCERIN  (NITROSTAT ) 0.4 MG SL tablet TAKE ONE TABLET UNDER THE TONGUE EVERY 5 MINS FOR THREE DOSES AS NEEDED FOR CHEST PAIN. CALL 911 IF 2ND DOSE DOESN'T HELP Patient not taking: Reported on 06/28/2024 04/10/24   [name], MD  tamsulosin  (FLOMAX ) 0.4 MG CAPS capsule TAKE ONE CAPSULE BY MOUTH DAILY Patient not taking: Reported on 06/28/2024 02/22/24   [name], MD  traZODone  (DESYREL ) 50 MG tablet Take 0.5-1 tablets (25-50 mg total) by mouth at bedtime as needed for sleep. Patient not taking: Reported on 06/28/2024 10/16/23   [name], MD    Physical Exam: Vitals:   06/28/24 0351 06/28/24 0500 06/28/24 0501 06/28/24 0600  BP: (!) 157/106 (!) 160/86  (!) 151/111  Pulse: (!) 112 (!) 109 (!) 122 (!) 115  Resp: (!) 28  20 18   Temp: (!) 101.6 F (38.7 C)  98.7 F (37.1 C)   TempSrc: Axillary  Oral   SpO2: 95% 92% 94% 96%  Weight:      Height:       Constitutional: Elderly male who appears chronically ill but no cute distress Eyes: Bruising underneath the right eye.  Pupils equal and react ENMT: Mucous membranes are moist.  Edentulous. Neck: normal, supple.  Respiratory: clear to auscultation bilaterally, no wheezing, no crackles. Normal respiratory effort.   Cardiovascular: Irregular irregular.  Trace lower extremity edema Abdomen: no tenderness, no masses palpated.   Bowel sounds positive.  Nephrostomy tube present on the left flank. Musculoskeletal: no clubbing / cyanosis. No joint deformity upper and lower extremities. Good ROM, no contractures. Normal muscle tone.  Skin: Patient with Neurologic: CN 2-12 grossly intact. Strength 5/5 in all 4.   Psychiatric: Normal judgment and insight. Alert and oriented x 3. Normal mood.   Data Reviewed:  EKG revealed atrial fibrillation at 100 bpm.  Reviewed labs, imaging, and pertinent records as documented  Assessment and Plan:  Sepsis secondary to urinary tract infection Patient presented with fever up to 101.8 F with tachycardia and tachypnea meeting SIRS criteria.  Labs significant for WBC 27 2.  Urinalysis significant for large hemoglobin, leukocytes, bacteria, and greater than 50 WBCs.  Initial lactic acid have been obtained. - Admit to a telemetry bed - Follow-up blood and urine cultures - Check lactic acid - Continue empiric antibiotics of Zosyn .  De-escalate when medically\ - Continue IV fluids - Tylenol  as needed for fever  Fall at home Generalized weakness Patient has been having increased weakness and had a fall at home yesterday.  Imaging studies did not reveal any acute intracranial or cervical fractures. - PT to evaluate  - Transitions of care consulted  Metastatic urothelial cancer Status post nephrostomy tube Follow-up 5 Dr. Lanny.  Receiving Keytruda  and Padcev .  Records note recent PET scan from 8/1 showed extensive retroperitoneal and pelvic adenopathy along with left supraclavicular and subclavicular adenopathy.  He was seen by palliative care on 8/14.  Patient's nephrostomy tube was exchanged yesterday but they noted   high-grade obstruction of the distal ureter at the ureterovesical junction.  This could not be crossed with a guidewire and the nephrostomy tube was replaced and left to gravity bag drainage.  Permanent atrial fibrillation Patient is currently in atrial fibrillation with heart rates in the low 100s.  He not on anticoagulation. - Continue metoprolol  and aspirin   Diastolic congestive heart failure Chronic.  Patient does not appear to be fluid overloaded at this time.  Last EF noted to be 60 to 65% when checked 01/2024.   Patient is currently not on any  diuretics - Strict I&O's and daily weights  Essential hypertension Patient blood pressures noted to be elevated up to 170/90. - Continue home blood pressure regimen as tolerated  Uncontrolled diabetes mellitus type 2, without long-term use of insulin  On admission glucose was noted to be 208.  Home medication regimen includes Januvia . - Hypoglycemic protocols - Hold Januvia  - CBGs before every meal with sensitive SSI - Adjust regimen as needed  Chronic kidney disease stage IIIb Creatinine noted to be 1.96 today with which appears similar to when checked a couple days ago. -Continue to monitor  Microcytic anemia Hemoglobin noted to be 11.3 which appears around patient's baseline. - Continue to monitor  Hyperlipidemia - Continue Crestor   Protein calorie malnutrition - Continue protein supplementation  DVT prophylaxis: Heparin  Advance Care Planning:   Code Status: Full Code.  Seems patient had relayed that he wanted to be the not resuscitate, but is unable to verify this at this time and his wife is not available via phone  Consults:  none  Family Communication: Son updated over the phone  Severity of Illness: The appropriate patient status for this patient is INPATIENT. Inpatient status is judged to be reasonable and necessary in order to provide the required intensity of service to ensure the patient's safety. The patient's presenting symptoms, physical exam findings, and initial radiographic and laboratory data in the context of their chronic comorbidities is felt to place them at high risk for further clinical deterioration. Furthermore, it is not anticipated that the patient will be medically stable for discharge from the hospital within 2 midnights of admission.   * I certify that at the point of admission it is my clinical judgment that the patient will require inpatient hospital care spanning beyond 2 midnights from the point of admission due to high intensity of service,  high risk  for further deterioration and high frequency of surveillance required.*  Author: Maximino DELENA Sharps, MD 06/28/2024 8:15 AM  For on call review [email or web address].

## 2024-06-28 NOTE — Progress Notes (Addendum)
 Pharmacy Antibiotic Note  Adam Harris is a 78 y.o. male admitted on 06/27/2024 with fall and UTI found to have MRSA bacteremia. Pharmacy has been consulted for vancomycin  dosing. Cr around baseline.  Plan: Vancomycin  2000mg  IV x1 then 1000mg  IV q24h - est AUC 483 Follow Cr Vancomycin  levels at [facility]  Height: 5' 11 (180.3 cm) Weight: 89.6 kg (197 lb 8.5 oz) IBW/kg (Calculated) : 75.3  Temp (24hrs), Avg:99.6 F (37.6 C), Min:97.4 F (36.3 C), Max:101.8 F (38.8 C)  Recent Labs  Lab 06/25/24 1023 06/27/24 1406 06/28/24 0111 06/28/24 1613  WBC 18.7* 21.5* 27.2*  --   CREATININE 1.94* 1.68* 1.96*  --   LATICACIDVEN  --   --   --  3.1*    Estimated Creatinine Clearance: 33.6 mL/min (A) (by C-G formula based on SCr of 1.96 mg/dL (H)).    Allergies  Allergen Reactions   Xarelto  Ella.Ehlers ] Other (See Comments)    Skin Blisters       Ozell Jamaica, PharmD, BCPS, [facility] Clinical Pharmacist [phone] Please check AMION for all [facility] Pharmacy numbers 06/28/2024

## 2024-06-28 NOTE — Plan of Care (Signed)
  Problem: Education: Goal: Knowledge of General Education information will improve Description: Including pain rating scale, medication(s)/side effects and non-pharmacologic comfort measures Outcome: Progressing   Problem: Activity: Goal: Risk for activity intolerance will decrease Outcome: Progressing   Problem: Pain Managment: Goal: General experience of comfort will improve and/or be controlled Outcome: Progressing

## 2024-06-28 NOTE — ED Notes (Signed)
 Pt wife susan updated on pt status

## 2024-06-28 NOTE — ED Notes (Signed)
 Repeat lactic results: 2.12

## 2024-06-28 NOTE — ED Notes (Signed)
 Istat machine did not send over lactic results, lactic: 3.20

## 2024-06-29 ENCOUNTER — Inpatient Hospital Stay (HOSPITAL_COMMUNITY)

## 2024-06-29 DIAGNOSIS — D72829 Elevated white blood cell count, unspecified: Secondary | ICD-10-CM

## 2024-06-29 DIAGNOSIS — R6510 Systemic inflammatory response syndrome (SIRS) of non-infectious origin without acute organ dysfunction: Secondary | ICD-10-CM

## 2024-06-29 DIAGNOSIS — I700 Atherosclerosis of aorta: Secondary | ICD-10-CM | POA: Diagnosis not present

## 2024-06-29 DIAGNOSIS — A4102 Sepsis due to Methicillin resistant Staphylococcus aureus: Secondary | ICD-10-CM

## 2024-06-29 DIAGNOSIS — A419 Sepsis, unspecified organism: Secondary | ICD-10-CM | POA: Diagnosis not present

## 2024-06-29 DIAGNOSIS — N390 Urinary tract infection, site not specified: Secondary | ICD-10-CM | POA: Diagnosis not present

## 2024-06-29 DIAGNOSIS — R509 Fever, unspecified: Secondary | ICD-10-CM

## 2024-06-29 DIAGNOSIS — B9562 Methicillin resistant Staphylococcus aureus infection as the cause of diseases classified elsewhere: Secondary | ICD-10-CM

## 2024-06-29 LAB — GLUCOSE, CAPILLARY
Glucose-Capillary: 148 mg/dL — ABNORMAL HIGH (ref 70–99)
Glucose-Capillary: 134 mg/dL — ABNORMAL HIGH (ref 70–99)
Glucose-Capillary: 148 mg/dL — ABNORMAL HIGH (ref 70–99)
Glucose-Capillary: 150 mg/dL — ABNORMAL HIGH (ref 70–99)
Glucose-Capillary: 148 mg/dL — ABNORMAL HIGH (ref 70–99)

## 2024-06-29 LAB — COMPREHENSIVE METABOLIC PANEL WITH GFR
ALT: 14 U/L (ref 0–44)
AST: 22 U/L (ref 15–41)
Albumin: 1.8 g/dL — ABNORMAL LOW (ref 3.5–5.0)
Alkaline Phosphatase: 113 U/L (ref 38–126)
Anion gap: 9 (ref 5–15)
BUN: 31 mg/dL — ABNORMAL HIGH (ref 8–23)
CO2: 15 mmol/L — ABNORMAL LOW (ref 22–32)
Calcium: 8.1 mg/dL — ABNORMAL LOW (ref 8.9–10.3)
Chloride: 109 mmol/L (ref 98–111)
Creatinine, Ser: 2.1 mg/dL — ABNORMAL HIGH (ref 0.61–1.24)
GFR, Estimated: 32 mL/min — ABNORMAL LOW (ref >60)
Glucose, Bld: 143 mg/dL — ABNORMAL HIGH (ref 70–99)
Potassium: 4.2 mmol/L (ref 3.5–5.1)
Sodium: 133 mmol/L — ABNORMAL LOW (ref 135–145)
Total Bilirubin: 2 mg/dL — ABNORMAL HIGH (ref 0.0–1.2)
Total Protein: 7.2 g/dL (ref 6.5–8.1)

## 2024-06-29 LAB — BLOOD GAS, ARTERIAL
Acid-base deficit: 3.4 mmol/L — ABNORMAL HIGH (ref 0.0–2.0)
Bicarbonate: 17.6 mmol/L — ABNORMAL LOW (ref 20.0–28.0)
Drawn by: 244901
O2 Saturation: 98.3 %
Patient temperature: 37.9
pCO2 arterial: 23 mmHg — ABNORMAL LOW (ref 32–48)
pH, Arterial: 7.5 — ABNORMAL HIGH (ref 7.35–7.45)
pO2, Arterial: 77 mmHg — ABNORMAL LOW (ref 83–108)

## 2024-06-29 LAB — CBC
HCT: 34 % — ABNORMAL LOW (ref 39.0–52.0)
Hemoglobin: 10.5 g/dL — ABNORMAL LOW (ref 13.0–17.0)
MCH: 23.1 pg — ABNORMAL LOW (ref 26.0–34.0)
MCHC: 30.9 g/dL (ref 30.0–36.0)
MCV: 74.7 fL — ABNORMAL LOW (ref 80.0–100.0)
Platelets: 222 K/uL (ref 150–400)
RBC: 4.55 MIL/uL (ref 4.22–5.81)
RDW: 21 % — ABNORMAL HIGH (ref 11.5–15.5)
WBC: 23.9 K/uL — ABNORMAL HIGH (ref 4.0–10.5)
nRBC: 0 % (ref 0.0–0.2)

## 2024-06-29 LAB — LACTIC ACID, PLASMA
Lactic Acid, Venous: 2.3 mmol/L (ref 0.5–1.9)
Lactic Acid, Venous: 3.2 mmol/L (ref 0.5–1.9)

## 2024-06-29 MED ORDER — TAMSULOSIN HCL 0.4 MG PO CAPS
0.4000 mg | ORAL_CAPSULE | Freq: Every day | ORAL | Status: DC
  Administered 2024-06-29 – 2024-07-10 (×12): 0.4 mg via ORAL
  Filled 2024-06-29 (×12): qty 1

## 2024-06-29 MED ORDER — DEXTROSE 5 % IV SOLN: INTRAVENOUS | Status: DC

## 2024-06-29 MED ORDER — FUROSEMIDE 10 MG/ML IJ SOLN
40.0000 mg | Freq: Once | INTRAMUSCULAR | Status: AC
  Administered 2024-06-29: 40 mg via INTRAVENOUS
  Filled 2024-06-29: qty 4

## 2024-06-29 MED ORDER — SODIUM CHLORIDE 0.9 % IV SOLN
1.0000 g | INTRAVENOUS | Status: DC
  Administered 2024-06-29: 1 g via INTRAVENOUS
  Filled 2024-06-29: qty 10

## 2024-06-29 MED ORDER — DAPTOMYCIN-SODIUM CHLORIDE 700-0.9 MG/100ML-% IV SOLN
700.0000 mg | Freq: Every day | INTRAVENOUS | Status: DC
  Administered 2024-06-29: 700 mg via INTRAVENOUS
  Filled 2024-06-29 (×2): qty 100

## 2024-06-29 MED ORDER — INSULIN ASPART 100 UNIT/ML IJ SOLN
0.0000 [IU] | INTRAMUSCULAR | Status: DC
  Administered 2024-06-29 (×3): 1 [IU] via SUBCUTANEOUS
  Administered 2024-06-30 (×2): 2 [IU] via SUBCUTANEOUS
  Administered 2024-07-01 (×3): 1 [IU] via SUBCUTANEOUS
  Administered 2024-07-02 – 2024-07-03 (×3): 2 [IU] via SUBCUTANEOUS
  Administered 2024-07-03: 3 [IU] via SUBCUTANEOUS
  Administered 2024-07-03: 2 [IU] via SUBCUTANEOUS
  Administered 2024-07-04: 1 [IU] via SUBCUTANEOUS
  Administered 2024-07-04 – 2024-07-05 (×4): 2 [IU] via SUBCUTANEOUS
  Administered 2024-07-05: 1 [IU] via SUBCUTANEOUS

## 2024-06-29 MED ORDER — FUROSEMIDE 10 MG/ML IJ SOLN
20.0000 mg | Freq: Once | INTRAMUSCULAR | Status: AC
  Administered 2024-06-29: 20 mg via INTRAVENOUS
  Filled 2024-06-29: qty 2

## 2024-06-29 NOTE — Plan of Care (Signed)

## 2024-06-29 NOTE — Progress Notes (Signed)
 Pharmacy Antibiotic Note  Adam Harris is a 78 y.o. male admitted on 06/27/2024 with fall and UTI found to have MRSA bacteremia. ID consulted (see note). Pharmacy has been consulted for Daptomycin  dosing; also on Ceftriaxone .  Plan: Daptomycin  8mg /kg IV q24h - borderline for adjustment to q48h, monitor Follow up weekly ck starting tomorrow AM Follow up renal function, cultures as available, clinical progress, length of tx  Height: 5' 11 (180.3 cm) Weight: 92.7 kg (204 lb 5.9 oz) IBW/kg (Calculated) : 75.3  Temp (24hrs), Avg:98.9 F (37.2 C), Min:97.4 F (36.3 C), Max:100.5 F (38.1 C)  Recent Labs  Lab 06/25/24 1023 06/27/24 1406 06/28/24 0111 06/28/24 1613 06/28/24 2016 06/28/24 2312 06/29/24 1022 06/29/24 1034 06/29/24 1357  WBC 18.7* 21.5* 27.2*  --   --   --  23.9*  --   --   CREATININE 1.94* 1.68* 1.96*  --   --   --  2.10*  --   --   LATICACIDVEN  --   --   --  3.1* 2.7* 2.1*  --  2.3* 3.2*    Estimated Creatinine Clearance: 34.3 mL/min (A) (by C-G formula based on SCr of 2.1 mg/dL (H)).    Allergies  Allergen Reactions   Xarelto  [Rivaroxaban ] Other (See Comments)    Skin Blisters    Vanc 8/16 >> 8/17 Zosyn  8/16 >> 8/17 Dapto 8/17 >> CRO 8/17 >>  8/16 BCx: MRSA 8/16 UCx: Staph aureus, Enterococcus faecalis  Adam Harris, PharmD, BCPS 06/29/2024 3:56 PM

## 2024-06-29 NOTE — Progress Notes (Signed)
 [facility]   ROUNDING   NOTE Lindsay Soulliere FMW:[id number]  DOB: 1946-01-24  DOA: 06/27/2024  PCP: [name], MD  06/29/2024,9:30 AM  LOS: 1 day    Code Status: Full code     from: Home current Dispo: Unclear    78 year old home dwelling white male Urothelial cancer + nephrostomy, prostate cancer (BCG induction RR AL nephroureterectomy 03/22/2020--his disease is refractory to BCG) followed with Dr. Rockland therapy Padcev  Keytruda  Complication with Keytruda  = pneumonitis decided to continue the same CT 06/01/2024 worsening pelvic/retroperitoneal adenopathy = metastatic urothelial CA with PET scan 8/1 extensive hypermetabolic pelvic nodes etc. He was seen in the office by Dr. 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 unamenable to guidewire passage Septic on admission WBC 27 Tmax 101.8 AKI BUN/creatinine 32/1.9 scans suggestive adenopathy  Plan   Acute toxic septic metabolic encephalopathy probably secondary to MSSA bacteremia, PCP wonders if Keytruda  is causing this Auto-consult to ID, continue vancomycin /Zosyn  per pharmacy consult for now and de-escalate probably to monotherapy per ID when other issues below are resolved  Polypharmacy Discontinue a VaPro 150 hydroxyzine  25 Remeron  30 oxycodone  5-10 which may be contributing to above  Sepsis secondary to urinary infection with recent nephrostomy exchange--avoid Januvia  in the future Note high-grade obstruction to distal ureter and unamenable to guidewire passage--- resume Flomax  0.4 He is draining from his nephrostomy well per nursing staff-getting bladder scan  Tachypnea tachycardia Concern for either overload, is + in terms of volume with some JVD or pneumonia [was  receiving 100/H fluid for fluid resuscitation which I stopped] CXR my over read shows pneumonia +/- fluid ---Give Lasix  20 IV now  Continue as above antimicrobial coverage-check a blood gas if he continues to be tachypneic-I will have to have a discussion with his wife if he worsens transferring to stepdown If negative I would proceed to CT chest with contrast to rule out PE as he is not on anticoagulation  Falls has bled score >3 Permanent A-fib not on anticoagulation [place] vas >2--has watchman device placed 11/2023 Was tachycardic this morning but seems to be a little bit improved after administration of metoprolol  25 Continue Imdur  30, Crestor  40 for now and monitor trends  Urothelial cancer, prostate cancer followed by palliative care-Keytruda  Padcev  Dr. Bronson added to treatment team If any worsening will ask palliative care who is already seeing him to come and talk with him  HFpEF last echo 60-65% See above medications  Uncontrolled diabetes mellitus with risk of hypoglycemia Soft diet may not be safe to eat so would keep n.p.o. 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 cannot make any determination of kidney function   Called wife susan--[phone]---tell me he was confused yesterday.--yesterday he was seen by RN and was tired--no real problems swellwing in the recent past   Data Reviewed:   No labs  DVT prophylaxis: Lovenox   Status is: Inpatient Remains inpatient appropriate because:   Sick     Subjective: Called by nursing about yellow news seen at bedside he is coherent knows where he is no chest pain he voided and passed urine nephrostomy several times  It looks like he was able to eat a banana but  he is quite somnolent and tachycardic and looks ill Tmax noted to be 100.3 despite antibiotics   Objective + exam Vitals:   06/29/24 0500 06/29/24 0635  06/29/24 0832 06/29/24 0900  BP: (!) 164/115 (!) 161/96 (!) 170/98   Pulse: (!) 127 100 (!) 119 (!) 119  Resp:   (!) 21   Temp: 98.3 F (36.8 C) (!) 97.4 F (36.3 C) 100.3 F (37.9 C)   TempSrc:  Oral    SpO2: 93% 99% (!) 88% 98%  Weight: 92.7 kg     Height:       Filed Weights   06/28/24 0111 06/28/24 1520 06/29/24 0500  Weight: 89.8 kg 89.6 kg 92.7 kg     Examination: Coherent x 3 tachypneic sick white male in some acute respiratory distress Some JVD Crackles posterolaterally Abdomen soft no rebound no guarding left-sided nephrostomy in place draining clear yellow fluid No lower extremity edema Power 5/5      Scheduled Meds:  artificial tears  2 drop Both Eyes QID   aspirin  EC  81 mg Oral Daily   enoxaparin  (LOVENOX ) injection  40 mg Subcutaneous Q24H   feeding supplement  237 mL Oral BID BM   insulin  aspart  0-5 Units Subcutaneous QHS   insulin  aspart  0-9 Units Subcutaneous TID WC   irbesartan   150 mg Oral Daily   isosorbide  mononitrate  30 mg Oral Daily   linaclotide   145 mcg Oral QAC breakfast   megestrol   800 mg Oral Daily   metoprolol  tartrate  25 mg Oral BID   mirtazapine   30 mg Oral QHS   rosuvastatin   40 mg Oral Daily   sodium bicarbonate   650 mg Oral BID   sodium chloride  flush  3 mL Intravenous Q12H   Continuous Infusions:  sodium chloride  100 mL/hr at 06/29/24 0530   piperacillin -tazobactam 3.375 g (06/29/24 0520)   vancomycin       Time 70  CRITICAL CARE Performed by: Jai-Gurmukh Grenda Lora   Total critical care time: 40 minutes  Critical care time was exclusive of separately billable procedures and treating other patients.  Critical care was necessary to treat or prevent imminent or life-threatening deterioration.  Critical care was time spent personally by me on the following activities: development of treatment plan with patient and/or surrogate as well as nursing, discussions with consultants, evaluation of patient's response to  treatment, examination of patient, obtaining history from patient or surrogate, ordering and performing treatments and interventions, ordering and review of laboratory studies, ordering and review of radiographic studies, pulse oximetry and re-evaluation of patient's condition.   Jai-Gurmukh Asjah Rauda, MD  [facility]

## 2024-06-29 NOTE — Consult Note (Addendum)
 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 male with history of CAD, T2DM, COPD,Afib s/p watchman, recurrent urothelial carcinoma of GU tract, nephrostomy tube, who has reported increasing falls recently by family. He also had recent exchange of his nephrostomy tube, on the day of admit. He was found confused and weka and brought into the ED for evaluation due to concern for infection. He did have similar episode when he last had his nephrostomy changed per family. He has had frequent falls at home, many bruises including his right eye. He reports he has jammed his left shoulder due to frequent falls  On admit, he was found to be tachycardic but not hypotensive but febrile up to 101.68F, his labs showed leukocytosis of 27K, he was started on broad abtx with vanco and piptazo. His infectious work up showed urine cx with GNR but also blood cx showing MSSA. 2nd urine cx showing efaecalis and staph aureus. He was narrowed to cefazolin .  Past Medical History:  Diagnosis Date   Abnormal radiologic findings on diagnostic imaging of renal pelvis, ureter, or bladder    bilateral ureter abnormalities   Anticoagulant long-term use    eliquis    Anxiety    pt denies   Arthritis    Atrial fibrillation, chronic (HCC)    CAD (coronary artery disease) cardiologist-- dr  hochrein   NSTEMI 02-04-2014  per cardiac cath chronic occluded RCA w/ faint left-to-right collaterals and aneurysmal LCFx with sluggish coronary flow/   NSTEMI --11-21-2016 per cardiac cath occluded proximal RCA & mid to diastal CFX 100%, med rx. If that does not work, PTCA or CABG   [facility])    bladder and kidney   CHF (congestive heart failure) (HCC)    CKD (chronic kidney disease), stage III (HCC)    patient unaware   DOE (dyspnea on exertion)    Fatty liver    pt denies   Hematuria 02/2019   History of COVID-19 10/2019   History of non-ST elevation myocardial infarction (NSTEMI)    02-04-2014  and 11-21-2016  cardiac cath done both times ,  medically management   History of shingles 12/2017   slight pain and numbness still noted in the area   Hyperlipidemia    Hypertension    Insomnia    Myocardial infarction [facility]) 2015   Persistent atrial fibrillation [facility])    cardiologsit-- dr hochrein   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 Kennyth   Thoracic aortic atherosclerosis (HCC)    Type 2 diabetes mellitus (HCC)    Urinary frequency     Allergies:  Allergies  Allergen Reactions   Xarelto  [Rivaroxaban ] Other (See Comments)    Skin Blisters     Current antibiotics:   MEDICATIONS:  artificial  tears  2 drop Both Eyes QID   aspirin  EC  81 mg Oral Daily   enoxaparin  (LOVENOX ) injection  40 mg Subcutaneous Q24H   feeding supplement  237 mL Oral BID BM   insulin  aspart  0-5 Units Subcutaneous QHS   insulin  aspart  0-9 Units Subcutaneous TID WC   irbesartan   150 mg Oral Daily   isosorbide  mononitrate  30 mg Oral Daily   linaclotide   145 mcg Oral QAC breakfast   megestrol   800 mg Oral Daily   metoprolol  tartrate  25 mg Oral BID   mirtazapine   30 mg Oral QHS   rosuvastatin   40 mg Oral Daily   sodium bicarbonate   650 mg Oral BID   sodium chloride  flush  3 mL Intravenous Q12H    Social History    Tobacco Use   Smoking status: Former    Current packs/day: 0.00    Average packs/day: 0.3 packs/day for 4.0 years (1.0 ttl pk-yrs)    Types: Cigarettes    Start date: 65    Quit date: 46    Years since quitting: 53.6    Passive exposure: Past   Smokeless tobacco: Former    Types: Chew    Quit date: 1972  Vaping Use   Vaping status: Never Used  Substance Use Topics   Alcohol  use: No   Drug use: No    Family History  Problem Relation Age of Onset   Hypertension Mother    Cancer Mother        anal cancer   Esophageal cancer Neg Hx    Pancreatic cancer Neg Hx    Stomach cancer Neg Hx    Liver disease Neg Hx     Review of Systems -  +frequent falls, some right shoulder pain. No back pain. Denies fever but subscribes to chills. 12 point ros is otherwise negative  OBJECTIVE: Temp:  [97.4 F (36.3 C)-100.5 F (38.1 C)] 100.3 F (37.9 C) (08/17 0832) Pulse Rate:  [97-127] 119 (08/17 0900) Resp:  [15-31] 21 (08/17 0832) BP: (108-170)/(80-115) 170/98 (08/17 0832) SpO2:  [88 %-99 %] 98 % (08/17 0900) Weight:  [89.6 kg-92.7 kg] 92.7 kg (08/17 0500) Physical Exam  Constitutional: He is oriented to person, place, and time. He appears well-developed and well-nourished. No distress.  HENT:  Mouth/Throat: Oropharynx is clear and moist. No oropharyngeal exudate.  Cardiovascular: Normal rate, regular rhythm and normal heart sounds. Exam reveals no gallop and no friction rub.  No murmur heard.  Pulmonary/Chest: Effort normal and breath sounds normal. No respiratory distress. +mild rhonci  Abdominal: Soft. Bowel sounds are normal. He exhibits no distension. There is no tenderness.  Lymphadenopathy:  He has no cervical adenopathy.  Neurological: He is alert and oriented to person, place, and time. Unable to raise left arm above shoulder Skin: Skin is warm and dry. Scattered echymosis Psychiatric: He has a normal mood and affect. His behavior is normal.   LABS: Results for  orders placed or performed during the hospital encounter of 06/27/24 (from the past 48 hours)  Blood Culture (routine x 2)     Status: None (Preliminary result)   Collection Time: 06/28/24  1:10 AM   Specimen: BLOOD RIGHT ARM  Result Value Ref Range   Specimen Description BLOOD RIGHT ARM    Special Requests      BOTTLES DRAWN AEROBIC AND ANAEROBIC Blood Culture adequate volume   Culture  Setup Time      GRAM POSITIVE COCCI IN CLUSTERS AEROBIC  BOTTLE ONLY CRITICAL VALUE NOTED.  VALUE IS CONSISTENT WITH PREVIOUSLY REPORTED AND CALLED VALUE. Performed at [facility], [address], [place], [place] [address]    Culture GRAM POSITIVE COCCI    Report Status PENDING   Comprehensive metabolic panel     Status: Abnormal   Collection Time: 06/28/24  1:11 AM  Result Value Ref Range   Sodium 133 (L) 135 - 145 mmol/L   Potassium 4.2 3.5 - 5.1 mmol/L   Chloride 102 98 - 111 mmol/L   CO2 19 (L) 22 - 32 mmol/L   Glucose, Bld 208 (H) 70 - 99 mg/dL    Comment: Glucose reference range applies only to samples taken after fasting for at least 8 hours.   BUN 32 (H) 8 - 23 mg/dL   Creatinine, Ser 8.03 (H) 0.61 - 1.24 mg/dL   Calcium  8.5 (L) 8.9 - 10.3 mg/dL   Total Protein 7.9 6.5 - 8.1 g/dL   Albumin  2.2 (L) 3.5 - 5.0 g/dL   AST 28 15 - 41 U/L   ALT 19 0 - 44 U/L   Alkaline Phosphatase 139 (H) 38 - 126 U/L   Total Bilirubin 1.2 0.0 - 1.2 mg/dL   GFR, Estimated 35 (L) >60 mL/min    Comment: (NOTE) Calculated using the CKD-EPI Creatinine Equation (2021)    Anion gap 12 5 - 15    Comment: Performed at [facility], [address], [place], [place] [address]  CBC with Differential     Status: Abnormal   Collection Time: 06/28/24  1:11 AM  Result Value Ref Range   WBC 27.2 (H) 4.0 - 10.5 K/uL   RBC 5.06 4.22 - 5.81 MIL/uL   Hemoglobin 11.6 (L) 13.0 - 17.0 g/dL   HCT 61.7 (L) 60.9 - 47.9 %   MCV 75.5 (L) 80.0 - 100.0 fL   MCH 22.9 (L) 26.0 - 34.0 pg   MCHC 30.4 30.0 - 36.0 g/dL    RDW 79.1 (H) 88.4 - 15.5 %   Platelets 356 150 - 400 K/uL   nRBC 0.0 0.0 - 0.2 %   Neutrophils Relative % 85 %   Neutro Abs 23.0 (H) 1.7 - 7.7 K/uL   Lymphocytes Relative 4 %   Lymphs Abs 1.2 0.7 - 4.0 K/uL   Monocytes Relative 9 %   Monocytes Absolute 2.5 (H) 0.1 - 1.0 K/uL   Eosinophils Relative 0 %   Eosinophils Absolute 0.0 0.0 - 0.5 K/uL   Basophils Relative 1 %   Basophils Absolute 0.2 (H) 0.0 - 0.1 K/uL   Immature Granulocytes 1 %   Abs Immature Granulocytes 0.30 (H) 0.00 - 0.07 K/uL    Comment: Performed at [facility], [address] [place], [place] [address]  Protime-INR     Status: Abnormal   Collection Time: 06/28/24  1:11 AM  Result Value Ref Range   Prothrombin Time 18.6 (H) 11.4 - 15.2 seconds   INR 1.5 (H) 0.8 - 1.2    Comment: (NOTE) INR goal varies based on device and disease states. Performed at [facility], [address], [place], [place] [address]   Urinalysis, w/ Reflex to Culture (Infection Suspected) -Urine, Catheterized     Status: Abnormal   Collection Time: 06/28/24  1:11 AM  Result Value Ref Range   Specimen Source URINE, CATHETERIZED    Color, Urine YELLOW YELLOW   APPearance HAZY (A) CLEAR   Specific Gravity, Urine 1.016 1.005 -  1.030   pH 6.0 5.0 - 8.0   Glucose, UA 50 (A) NEGATIVE mg/dL   Hgb urine dipstick LARGE (A) NEGATIVE   Bilirubin Urine NEGATIVE NEGATIVE   Ketones, ur NEGATIVE NEGATIVE mg/dL   Protein, ur 899 (A) NEGATIVE mg/dL   Nitrite NEGATIVE NEGATIVE   Leukocytes,Ua LARGE (A) NEGATIVE   RBC / HPF >50 0 - 5 RBC/hpf   WBC, UA >50 0 - 5 WBC/hpf    Comment:        Reflex urine culture not performed if WBC <=10, OR if Squamous epithelial cells >5. If Squamous epithelial cells >5 suggest recollection.    Bacteria, UA FEW (A) NONE SEEN   Squamous Epithelial / HPF 0-5 0 - 5 /HPF   Mucus PRESENT     Comment: Performed at [facility], [address] [place], [place] [address]  Blood Culture (routine x 2)      Status: None (Preliminary result)   Collection Time: 06/28/24  2:25 AM   Specimen: BLOOD LEFT ARM  Result Value Ref Range   Specimen Description BLOOD LEFT ARM    Special Requests      BOTTLES DRAWN AEROBIC AND ANAEROBIC Blood Culture adequate volume   Culture  Setup Time      GRAM POSITIVE COCCI IN BOTH AEROBIC AND ANAEROBIC BOTTLES CRITICAL RESULT CALLED TO, READ BACK BY AND VERIFIED WITH: MAYA OZELL JAMAICA [id number] 1911 BY J RAZZAK,MT Performed at [facility], [address] [place], [place] [address]    Culture GRAM POSITIVE COCCI    Report Status PENDING   Blood Culture ID Panel (Reflexed)     Status: Abnormal   Collection Time: 06/28/24  2:25 AM  Result Value Ref Range   Enterococcus faecalis NOT DETECTED NOT DETECTED   Enterococcus Faecium NOT DETECTED NOT DETECTED   Listeria monocytogenes NOT DETECTED NOT DETECTED   Staphylococcus species DETECTED (A) NOT DETECTED    Comment: CRITICAL RESULT CALLED TO, READ BACK BY AND VERIFIED WITH: PHARMD MICHAEL BITONTI [id number] 1911 BY J RAZZAK, MT    Staphylococcus aureus (BCID) DETECTED (A) NOT DETECTED    Comment: Methicillin (oxacillin)-resistant Staphylococcus aureus (MRSA). MRSA is predictably resistant to beta-lactam antibiotics (except ceftaroline). Preferred therapy is vancomycin  unless clinically contraindicated. Patient requires contact precautions if  hospitalized. CRITICAL RESULT CALLED TO, READ BACK BY AND VERIFIED WITH: PHARMD MICHAEL BITONTI [id number] 1911 BY J RAZZAK, MT    Staphylococcus epidermidis NOT DETECTED NOT DETECTED   Staphylococcus lugdunensis NOT DETECTED NOT DETECTED   Streptococcus species NOT DETECTED NOT DETECTED   Streptococcus agalactiae NOT DETECTED NOT DETECTED   Streptococcus pneumoniae NOT DETECTED NOT DETECTED   Streptococcus pyogenes NOT DETECTED NOT DETECTED   A.calcoaceticus-baumannii NOT DETECTED NOT DETECTED   Bacteroides fragilis NOT DETECTED NOT DETECTED   Enterobacterales  NOT DETECTED NOT DETECTED   Enterobacter cloacae complex NOT DETECTED NOT DETECTED   Escherichia coli NOT DETECTED NOT DETECTED   Klebsiella aerogenes NOT DETECTED NOT DETECTED   Klebsiella oxytoca NOT DETECTED NOT DETECTED   Klebsiella pneumoniae NOT DETECTED NOT DETECTED   Proteus species NOT DETECTED NOT DETECTED   Salmonella species NOT DETECTED NOT DETECTED   Serratia marcescens NOT DETECTED NOT DETECTED   Haemophilus influenzae NOT DETECTED NOT DETECTED   Neisseria meningitidis NOT DETECTED NOT DETECTED   Pseudomonas aeruginosa NOT DETECTED NOT DETECTED   Stenotrophomonas maltophilia NOT DETECTED NOT DETECTED   Candida albicans NOT DETECTED NOT DETECTED   Candida auris NOT DETECTED NOT DETECTED  Candida glabrata NOT DETECTED NOT DETECTED   Candida krusei NOT DETECTED NOT DETECTED   Candida parapsilosis NOT DETECTED NOT DETECTED   Candida tropicalis NOT DETECTED NOT DETECTED   Cryptococcus neoformans/gattii NOT DETECTED NOT DETECTED   Meth resistant mecA/C and MREJ DETECTED (A) NOT DETECTED    Comment: CRITICAL RESULT CALLED TO, READ BACK BY AND VERIFIED WITH: MAYA OZELL JAMAICA [id number] 1911 BY JINNY COMMON, MT Performed at [facility], [address], [place], [place] [address]   Lactic acid, plasma     Status: Abnormal   Collection Time: 06/28/24  4:13 PM  Result Value Ref Range   Lactic Acid, Venous 3.1 (HH) 0.5 - 1.9 mmol/L    Comment: CRITICAL RESULT CALLED TO, READ BACK BY AND VERIFIED WITH MOSHER. JAMEY RN @ 1717 06/28/24 GORMAN ANTES Performed at [facility], [address], [place], [place] [address]   Glucose, capillary     Status: Abnormal   Collection Time: 06/28/24  4:38 PM  Result Value Ref Range   Glucose-Capillary 120 (H) 70 - 99 mg/dL    Comment: Glucose reference range applies only to samples taken after fasting for at least 8 hours.  Lactic acid, plasma     Status: Abnormal   Collection Time: 06/28/24  8:16 PM  Result Value Ref Range    Lactic Acid, Venous 2.7 (HH) 0.5 - 1.9 mmol/L    Comment: CRITICAL VALUE NOTED. VALUE IS CONSISTENT WITH PREVIOUSLY REPORTED/CALLED VALUE Performed at [facility], [address] [place], [place] [address]   Glucose, capillary     Status: Abnormal   Collection Time: 06/28/24 10:23 PM  Result Value Ref Range   Glucose-Capillary 140 (H) 70 - 99 mg/dL    Comment: Glucose reference range applies only to samples taken after fasting for at least 8 hours.  Lactic acid, plasma     Status: Abnormal   Collection Time: 06/28/24 11:12 PM  Result Value Ref Range   Lactic Acid, Venous 2.1 (HH) 0.5 - 1.9 mmol/L    Comment: CRITICAL VALUE NOTED. VALUE IS CONSISTENT WITH PREVIOUSLY REPORTED/CALLED VALUE Performed at [facility], [address], [place], [place] [address]   Glucose, capillary     Status: Abnormal   Collection Time: 06/29/24  5:52 AM  Result Value Ref Range   Glucose-Capillary 148 (H) 70 - 99 mg/dL    Comment: Glucose reference range applies only to samples taken after fasting for at least 8 hours.    MICRO: 8/16 blood cx MRSA 8/16 urine cx sa and enterococcus 8/15 urine cx GNR  IMAGING: CT Cervical Spine Wo Contrast Result Date: 06/28/2024 CLINICAL DATA:  Fall EXAM: CT CERVICAL SPINE WITHOUT CONTRAST TECHNIQUE: Multidetector CT imaging of the cervical spine was performed without intravenous contrast. Multiplanar CT image reconstructions were also generated. RADIATION DOSE REDUCTION: This exam was performed according to the departmental dose-optimization program which includes automated exposure control, adjustment of the mA and/or kV according to patient size and/or use of iterative reconstruction technique. COMPARISON:  None Available. FINDINGS: Alignment: No subluxation Skull base and vertebrae: No acute fracture. No primary bone lesion or focal pathologic process. Soft tissues and spinal canal: No prevertebral fluid or swelling. No visible canal hematoma. Disc levels:  Moderate degenerative disc disease with disc space narrowing and spurring. Advanced degenerative facet disease, left greater than right. Multilevel bilateral neural foraminal narrowing. Upper chest: No acute findings Other: None IMPRESSION: Moderate to advanced degenerative changes as above. No  acute bony abnormality. Electronically Signed   By: Franky Crease M.D.   On: 06/28/2024 01:06   CT Head Wo Contrast Result Date: 06/28/2024 CLINICAL DATA:  Fall EXAM: CT HEAD WITHOUT CONTRAST TECHNIQUE: Contiguous axial images were obtained from the base of the skull through the vertex without intravenous contrast. RADIATION DOSE REDUCTION: This exam was performed according to the departmental dose-optimization program which includes automated exposure control, adjustment of the mA and/or kV according to patient size and/or use of iterative reconstruction technique. COMPARISON:  05/25/2024 FINDINGS: Brain: No acute intracranial abnormality. Specifically, no hemorrhage, hydrocephalus, mass lesion, acute infarction, or significant intracranial injury. Vascular: No hyperdense vessel or unexpected calcification. Skull: No acute calvarial abnormality. Sinuses/Orbits: No acute findings Other: None IMPRESSION: No acute intracranial abnormality. Electronically Signed   By: Franky Crease M.D.   On: 06/28/2024 01:04   DG Chest Port 1 View Result Date: 06/28/2024 CLINICAL DATA:  Questionable sepsis.  Fall. EXAM: PORTABLE CHEST 1 VIEW COMPARISON:  11/29/2023 FINDINGS: Heart and mediastinal contours are within normal limits. No confluent airspace opacities or effusions. No acute bony abnormality. IMPRESSION: No active disease. Electronically Signed   By: Franky Crease M.D.   On: 06/28/2024 00:57   IR NEPHROSTOMY EXCHANGE LEFT Result Date: 06/27/2024 INDICATION: Indwelling left percutaneous nephrostomy tube. 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 EXAM: LEFT PERCUTANEOUS NEPHROSTOMY TUBE  EXCHANGE UNDER FLUOROSCOPY INCLUDING NEPHROSTOGRAM COMPARISON:  None Available. MEDICATIONS: None ANESTHESIA/SEDATION: None CONTRAST:  30 mL Omnipaque  300-administered into the collecting system(s) FLUOROSCOPY: Radiation Exposure Index (as provided by the fluoroscopic device): 53 mGy Kerma COMPLICATIONS: None immediate. PROCEDURE: Informed written consent was obtained from the patient after a thorough discussion of the procedural risks, benefits and alternatives. All questions were addressed. Maximal Sterile Barrier Technique was utilized including caps, mask, sterile gowns, sterile gloves, sterile drape, hand hygiene and skin antiseptic. A timeout was performed prior to the initiation of the procedure. Contrast was injected via the pre-existing left nephrostomy tube and fluoroscopic imaging safe. The nephrostomy tube was cut and removed over a guidewire. A 5 [place] Kumpe catheter was advanced into the left ureter and additional contrast injection performed and imaging performed of the entire left ureter into the pelvis. A new 10 French nephrostomy tube was then advanced over the wire and formed at the level of the left renal pelvis. A fluoroscopic image was saved to confirm tube position. The tube was then connected to a new gravity drainage bag and secured at the exit site with a Prolene retention suture and StatLock device. FINDINGS: Nephrostogram demonstrates poor antegrade flow of contrast down the ureter. Additional catheter injection of the left ureter shows ureteral dilatation and evidence of high-grade ureteral obstruction in the pelvis just above the UVJ. A guidewire would not cross into the bladder lumen. The replaced nephrostomy tube will be left to gravity drainage. IMPRESSION: Left percutaneous nephrostomy tube exchange under fluoroscopy. Detailed nephrostogram performed today including evaluation of the entire ureter demonstrates high-grade obstruction of the distal ureter at the ureterovesical  junction. This could not be crossed with a guidewire and the nephrostomy tube was replaced and will be left to gravity bag drainage. Electronically Signed   By: Marcey Moan M.D.   On: 06/27/2024 16:45    HISTORICAL MICRO/IMAGING  Assessment/Plan:  78yo M with SIRS, and altered mental status after having nephrostomy tube exchanged. Found to have fever, leukocytosis. Interestingly his blood cx showing MRSA and not gram negative.. but urine cx showing SA and  e.faecalis.  Recommend to change abtx to daptomycin  to cover (enterococcus and mrsa) plus ceftriaxone  to see what is grown from GNR urine cx Will recommend to get TTE/TEE to rule out endocarditis Repeat blood cx on 8/18 to see that bacteremia is clearing  Left shoulder injury -suspect rotator cuff injury.SABRA does not appear red and tender to think of seeded joint at present  Uti = likely from recent urologic instrumentation. Pathogens will be covered by unasyn  evaluation of this patient requires complex antimicrobial therapy evaluation and counseling and isolation needs for disease transmission risk assessment and mitigation.    I have personally spent 85  minutes involved in face-to-face and non-face-to-face activities for this patient on the day of the visit. Professional time spent includes the following activities: Preparing to see the patient (review of tests), Obtaining and/or reviewing separately obtained history (admission/discharge record), Performing a medically appropriate examination and/or evaluation , Ordering medications/tests/procedures, referring and communicating with other health care professionals, Documenting clinical information in the EMR, Independently interpreting results (not separately reported), Communicating results to the patient and family, Counseling and educating the patient/family and care coordination

## 2024-06-29 NOTE — Progress Notes (Signed)
 Patient reviewed looks a little bit more awake coherent Blood gas reviewed and alkalotic-increased O2 flow allow diet as he is more coherent discontinue fluids for now and repeat x-ray in the morning He is developing a little bit of an acidosis based on labs which could be from sepsis and he has some ATN additionally--given Lasix  because of indeterminate chest x-ray I will get a noncontrast CT chest and decide on IVF if needed His lactic acid is up at 2.3 despite antibiotics Can stay at current level of care on telemetry for now-if something changes we will follow-up  Reggie Grimes, MD [facility] 12:03 PM

## 2024-06-30 ENCOUNTER — Inpatient Hospital Stay (HOSPITAL_COMMUNITY)

## 2024-06-30 ENCOUNTER — Telehealth: Payer: Self-pay

## 2024-06-30 ENCOUNTER — Ambulatory Visit: Payer: Self-pay | Admitting: Family Medicine

## 2024-06-30 ENCOUNTER — Encounter: Payer: Self-pay | Admitting: Hematology

## 2024-06-30 DIAGNOSIS — R7881 Bacteremia: Secondary | ICD-10-CM

## 2024-06-30 DIAGNOSIS — N390 Urinary tract infection, site not specified: Secondary | ICD-10-CM | POA: Diagnosis not present

## 2024-06-30 DIAGNOSIS — A419 Sepsis, unspecified organism: Secondary | ICD-10-CM | POA: Diagnosis not present

## 2024-06-30 DIAGNOSIS — B9562 Methicillin resistant Staphylococcus aureus infection as the cause of diseases classified elsewhere: Secondary | ICD-10-CM

## 2024-06-30 LAB — CBC WITH DIFFERENTIAL/PLATELET
Abs Immature Granulocytes: 0.24 K/uL — ABNORMAL HIGH (ref 0.00–0.07)
Absolute Lymphocytes: 1226 {cells}/uL (ref 850–3900)
Absolute Monocytes: 2129 {cells}/uL — ABNORMAL HIGH (ref 200–950)
Basophils Absolute: 129 {cells}/uL (ref 0–200)
Basophils Absolute: 0.1 K/uL (ref 0.0–0.1)
Basophils Relative: 0.6 %
Basophils Relative: 1 %
Eosinophils Absolute: 108 {cells}/uL (ref 15–500)
Eosinophils Absolute: 0.1 K/uL (ref 0.0–0.5)
Eosinophils Relative: 0.5 %
Eosinophils Relative: 1 %
HCT: 37.4 % — ABNORMAL LOW (ref 38.5–50.0)
HCT: 32.3 % — ABNORMAL LOW (ref 39.0–52.0)
Hemoglobin: 11.3 g/dL — ABNORMAL LOW (ref 13.2–17.1)
Hemoglobin: 10.1 g/dL — ABNORMAL LOW (ref 13.0–17.0)
Immature Granulocytes: 2 %
Lymphocytes Relative: 3 %
Lymphs Abs: 0.5 K/uL — ABNORMAL LOW (ref 0.7–4.0)
MCH: 23.2 pg — ABNORMAL LOW (ref 27.0–33.0)
MCH: 23.1 pg — ABNORMAL LOW (ref 26.0–34.0)
MCHC: 30.2 g/dL — ABNORMAL LOW (ref 32.0–36.0)
MCHC: 31.3 g/dL (ref 30.0–36.0)
MCV: 76.8 fL — ABNORMAL LOW (ref 80.0–100.0)
MCV: 73.9 fL — ABNORMAL LOW (ref 80.0–100.0)
MPV: 8.9 fL (ref 7.5–12.5)
Monocytes Absolute: 0.8 K/uL (ref 0.1–1.0)
Monocytes Relative: 9.9 %
Monocytes Relative: 5 %
Neutro Abs: 17910 {cells}/uL — ABNORMAL HIGH (ref 1500–7800)
Neutro Abs: 14.7 K/uL — ABNORMAL HIGH (ref 1.7–7.7)
Neutrophils Relative %: 83.3 %
Neutrophils Relative %: 88 %
Platelets: 394 Thousand/uL (ref 140–400)
Platelets: 216 K/uL (ref 150–400)
RBC: 4.87 Million/uL (ref 4.20–5.80)
RBC: 4.37 MIL/uL (ref 4.22–5.81)
RDW: 18 % — ABNORMAL HIGH (ref 11.0–15.0)
RDW: 20.7 % — ABNORMAL HIGH (ref 11.5–15.5)
Total Lymphocyte: 5.7 %
WBC: 21.5 Thousand/uL — ABNORMAL HIGH (ref 3.8–10.8)
WBC: 16.5 K/uL — ABNORMAL HIGH (ref 4.0–10.5)
nRBC: 0 % (ref 0.0–0.2)

## 2024-06-30 LAB — URINALYSIS W MICROSCOPIC + REFLEX CULTURE
Bilirubin Urine: NEGATIVE
Glucose, UA: NEGATIVE
Ketones, ur: NEGATIVE
Nitrites, Initial: NEGATIVE
Specific Gravity, Urine: 1.024 (ref 1.001–1.035)
Squamous Epithelial / HPF: NONE SEEN /HPF (ref <=5)
WBC, UA: >=60 /HPF — AB (ref 0–5)
pH: 7.5 (ref 5.0–8.0)

## 2024-06-30 LAB — MAGNESIUM: Magnesium: 1.9 mg/dL (ref 1.7–2.4)

## 2024-06-30 LAB — CULTURE, BLOOD (ROUTINE X 2): Special Requests: ADEQUATE

## 2024-06-30 LAB — ECHOCARDIOGRAM COMPLETE BUBBLE STUDY
AR max vel: 3.45 cm2
AV Area VTI: 3.63 cm2
AV Area mean vel: 3.11 cm2
AV Mean grad: 2 mmHg
AV Peak grad: 3.6 mmHg
Ao pk vel: 0.95 m/s
Area-P 1/2: 6.96 cm2
S' Lateral: 2.1 cm

## 2024-06-30 LAB — URINE CULTURE
Culture: >=100000 — AB
MICRO NUMBER:: 16842316
SPECIMEN QUALITY:: ADEQUATE

## 2024-06-30 LAB — COMPREHENSIVE METABOLIC PANEL WITH GFR
AG Ratio: 0.6 (calc) — ABNORMAL LOW (ref 1.0–2.5)
ALT: 16 U/L (ref 9–46)
ALT: 16 U/L (ref 0–44)
AST: 21 U/L (ref 10–35)
AST: 32 U/L (ref 15–41)
Albumin: 2.6 g/dL — ABNORMAL LOW (ref 3.6–5.1)
Albumin: 1.6 g/dL — ABNORMAL LOW (ref 3.5–5.0)
Alkaline Phosphatase: 109 U/L (ref 38–126)
Alkaline phosphatase (APISO): 120 U/L (ref 35–144)
Anion gap: 13 (ref 5–15)
BUN/Creatinine Ratio: 19 (calc) (ref 6–22)
BUN: 32 mg/dL — ABNORMAL HIGH (ref 7–25)
BUN: 39 mg/dL — ABNORMAL HIGH (ref 8–23)
CO2: 22 mmol/L (ref 20–32)
CO2: 19 mmol/L — ABNORMAL LOW (ref 22–32)
Calcium: 8.5 mg/dL — ABNORMAL LOW (ref 8.6–10.3)
Calcium: 8.3 mg/dL — ABNORMAL LOW (ref 8.9–10.3)
Chloride: 105 mmol/L (ref 98–110)
Chloride: 107 mmol/L (ref 98–111)
Creat: 1.68 mg/dL — ABNORMAL HIGH (ref 0.70–1.28)
Creatinine, Ser: 2.36 mg/dL — ABNORMAL HIGH (ref 0.61–1.24)
GFR, Estimated: 28 mL/min — ABNORMAL LOW (ref >60)
Globulin: 4.7 g/dL — ABNORMAL HIGH (ref 1.9–3.7)
Glucose, Bld: 135 mg/dL — ABNORMAL HIGH (ref 65–99)
Glucose, Bld: 193 mg/dL — ABNORMAL HIGH (ref 70–99)
Potassium: 5.1 mmol/L (ref 3.5–5.3)
Potassium: 3.9 mmol/L (ref 3.5–5.1)
Sodium: 135 mmol/L (ref 135–146)
Sodium: 139 mmol/L (ref 135–145)
Total Bilirubin: 1.3 mg/dL — ABNORMAL HIGH (ref 0.2–1.2)
Total Bilirubin: 1.4 mg/dL — ABNORMAL HIGH (ref 0.0–1.2)
Total Protein: 7.3 g/dL (ref 6.1–8.1)
Total Protein: 6.5 g/dL (ref 6.5–8.1)
eGFR: 42 mL/min/1.73m2 — ABNORMAL LOW (ref >=60)

## 2024-06-30 LAB — GLUCOSE, CAPILLARY
Glucose-Capillary: 105 mg/dL — ABNORMAL HIGH (ref 70–99)
Glucose-Capillary: 111 mg/dL — ABNORMAL HIGH (ref 70–99)
Glucose-Capillary: 111 mg/dL — ABNORMAL HIGH (ref 70–99)
Glucose-Capillary: 116 mg/dL — ABNORMAL HIGH (ref 70–99)
Glucose-Capillary: 152 mg/dL — ABNORMAL HIGH (ref 70–99)
Glucose-Capillary: 155 mg/dL — ABNORMAL HIGH (ref 70–99)

## 2024-06-30 LAB — CULTURE INDICATED

## 2024-06-30 LAB — CK: Total CK: 40 U/L — ABNORMAL LOW (ref 49–397)

## 2024-06-30 MED ORDER — DAPTOMYCIN-SODIUM CHLORIDE 700-0.9 MG/100ML-% IV SOLN
700.0000 mg | INTRAVENOUS | Status: DC
  Filled 2024-06-30: qty 100

## 2024-06-30 MED ORDER — METOPROLOL TARTRATE 5 MG/5ML IV SOLN
2.5000 mg | Freq: Once | INTRAVENOUS | Status: AC
  Administered 2024-06-30: 2.5 mg via INTRAVENOUS
  Filled 2024-06-30: qty 5

## 2024-06-30 MED ORDER — METOPROLOL TARTRATE 25 MG PO TABS
25.0000 mg | ORAL_TABLET | Freq: Three times a day (TID) | ORAL | Status: DC
  Administered 2024-06-30 – 2024-07-06 (×17): 25 mg via ORAL
  Filled 2024-06-30 (×18): qty 1

## 2024-06-30 MED ORDER — METOPROLOL TARTRATE 25 MG PO TABS: 25.0000 mg | ORAL_TABLET | Freq: Three times a day (TID) | ORAL | Status: DC

## 2024-06-30 MED ORDER — MAGNESIUM SULFATE IN D5W 1-5 GM/100ML-% IV SOLN
1.0000 g | Freq: Once | INTRAVENOUS | Status: AC
  Administered 2024-06-30: 1 g via INTRAVENOUS
  Filled 2024-06-30: qty 100

## 2024-06-30 MED ORDER — ISOSORBIDE MONONITRATE ER 30 MG PO TB24
15.0000 mg | ORAL_TABLET | Freq: Every day | ORAL | Status: DC
  Administered 2024-07-01 – 2024-07-10 (×10): 15 mg via ORAL
  Filled 2024-06-30 (×10): qty 1

## 2024-06-30 NOTE — Progress Notes (Signed)
 [facility]   ROUNDING   NOTE Adam Harris FMW:[id number]  DOB: 1946-09-22  DOA: 06/27/2024  PCP: Adam Garnette HERO, MD  06/30/2024,11:50 AM  LOS: 2 days    Code Status: Full code     from: Home current Dispo: Unclear    78 year old home dwelling white male Urothelial cancer + nephrostomy, prostate cancer (BCG induction RR AL nephroureterectomy 03/22/2020--his disease is refractory to BCG) followed with Dr. Rockland therapy Padcev  Keytruda  Complication with Keytruda  = pneumonitis decided to continue the same CT 06/01/2024 worsening pelvic/retroperitoneal adenopathy = metastatic urothelial CA with PET scan 8/1 extensive hypermetabolic pelvic nodes etc. He was seen in the office by Dr. 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 unamenable to guidewire passage Septic on admission WBC 27 Tmax 101.8 AKI BUN/creatinine 32/1.9 scans suggestive adenopathy  Plan   Septic 2/2 MSSA bacteremia, PCP wonders if Keytruda  is causing this ID engaged, on Dapto/Ceftriaxone  now Looks improved overall--sepsis physiology somewhat improved  Polypharmacy Stopped AvaPro  150 hydroxyzine  25 Remeron  30 oxycodone  5-10 --mentation improved--slowly resume  Sepsis secondary to urinary infection with recent nephrostomy exchange--avoid Januvia  in the future Note high-grade obstruction to distal ureter-has aggressive urothelial cancer which has metastasized and worsened based on recent PET scan 8/1 so it would be reasonable to consider palliative care if from my perspective-will discuss with Dr. Wilmer resume Flomax  0.4 He is draining from his nephrostomy well per nursing staff-bladder scan negative he does not produce urine he drains urine through his left  nephrostomy I discussed the scenario with urology [name] 8/18-patient ideally should have a nephrostogram if there is any concern of poor drainage but if it is functioning can hold off-it is unlikely this would change overall management as every time they have tried to instrument him to remove the obstruction he decompensates Palliative care input is integral-appreciative of oncologist input today who will be seeing him  Tachypnea tachycardia Keep volumes even--no fluid, no lasix  today  Falls has bled score >3 Vtach 20 beats Permanent A-fib not on anticoagulation [place] vas >2--has watchman device placed 11/2023 Increase metoprolol  to 25 tid Cut back next dose imdur  to 15--continue Crestor  40 for now and monitor trends  Urothelial cancer, prostate cancer followed by palliative care-Keytruda  Padcev  Dr. Bronson added to treatment team  HFpEF last echo 60-65% See above medications  Uncontrolled diabetes mellitus with risk of hypoglycemia  Updated earlier [name]--[phone]    Data Reviewed:   Sodium 139 potassium 3.9 bicarb 15-->19 BUN/creatinine 31/2.1-->39/2.3 magnesium  1.9 albumin  1.6 total bili 1.4  WBC 23-->16 Hemoglobin 10.1 MCV 73 platelet 216  DVT prophylaxis: Lovenox   Status is: Inpatient Remains inpatient appropriate because:   Sick     Subjective:  Looks fair No distress --- nursing reported some V. tach earlier-he did not feel it  Objective + exam Vitals:   06/30/24 0100 06/30/24 0400 06/30/24 0500 06/30/24 0824  BP: 117/80 (!) 136/94  (!) 123/93  Pulse:  90  (!) 110  Resp: 20   20  Temp: 98.3 F (36.8 C) 98.3 F (36.8 C)  98.4 F (36.9 C)  TempSrc:  Oral    SpO2: 98% 99%  99%  Weight:   90 kg   Height:       Filed Weights   06/28/24 1520 06/29/24 0500  06/30/24 0500  Weight: 89.6 kg 92.7 kg 90 kg     Examination: Overall looks a little bit better S1-S2 underlying A-fib noted  Abdomen soft no rebound no guarding Chest is clear no  wheeze rales rhonchi I took him off oxygen and he is maintaining without No lower extremity edema Nephrostomy draining tinged urine but overall yellow Power 5/5    Scheduled Meds:  artificial tears  2 drop Both Eyes QID   aspirin  EC  81 mg Oral Daily   enoxaparin  (LOVENOX ) injection  40 mg Subcutaneous Q24H   feeding supplement  237 mL Oral BID BM   insulin  aspart  0-9 Units Subcutaneous Q4H   isosorbide  mononitrate  30 mg Oral Daily   linaclotide   145 mcg Oral QAC breakfast   metoprolol  tartrate  25 mg Oral TID   sodium bicarbonate   650 mg Oral BID   sodium chloride  flush  3 mL Intravenous Q12H   tamsulosin   0.4 mg Oral Daily   Continuous Infusions:  cefTRIAXone  (ROCEPHIN )  IV 1 g (06/29/24 1645)   DAPTOmycin  700 mg (06/29/24 1753)   magnesium  sulfate bolus IVPB      Time 40  Adam Alexzander Dolinger, MD  [facility]

## 2024-06-30 NOTE — Plan of Care (Signed)

## 2024-06-30 NOTE — Progress Notes (Signed)
*  PRELIMINARY RESULTS* Echocardiogram 2D Echocardiogram has been performed.  Adam Harris Stallion 06/30/2024, 4:06 PM

## 2024-06-30 NOTE — Progress Notes (Addendum)
 [place] S[place]   DOB:09-03-1946   FM#:[id number]      ASSESSMENT & PLAN:  Adam Harris is a 78 year old male patient with oncologic history significant for metastatic urothelial carcinoma.  Patient was admitted on 06/28/2024 with sepsis secondary to UTI.  Metastatic urothelial carcinoma, recurrent - Initially diagnosed May 2021.  Patient has had multiple recurrences since then. - Status post multiple therapies with BCG, immunotherapy. - PET scan 06/13/2024 shows extensive retroperitoneal and pelvic adenopathy consistent with metastatic disease.  Also shows left supraclavicular and subclavicular adenopathy consistent with mets.  No bladder lesion identified. - Nephrostomy tube intact.  Left neph tube exchanged on 8/15. - On Padcev  + Keytruda  every 3 weeks.  Padcev  administered on 8/13, plan was to administer weekly x 2 weeks followed by a 1 week break. - No immunotherapy planned during this admission - Medical oncology/Dr. Lanny following closely  Sepsis UTI - Patient admitted on 8/16 with complaints of fever. - Blood cultures positive for staph. - On antibiotics, continue as ordered - Continue to monitor fever curve  Cancer related pain - Continue pain management as ordered - Monitor closely  Fatigue Failure to thrive - Encourage nutrition - Continue supportive care  Code Status Full  Subjective:  Patient seen awake and alert laying in bed.  He is chronically ill-appearing.  Denies current pain.  Denies shortness of breath although appears slightly short of breath during exam and when he is talking.  Denies acute bleeding or other GI symptoms.  No other acute complaints offered.   Objective:   Intake/Output Summary (Last 24 hours) at 06/30/2024 1332 Last data filed at 06/30/2024 1100 Gross per 24 hour  Intake 835.69 ml  Output 1700 ml  Net -864.31 ml     PHYSICAL EXAMINATION: ECOG PERFORMANCE STATUS: 3 - Symptomatic, >50% confined to bed  Vitals:   06/30/24 0824 06/30/24 1232   BP: (!) 123/93 (!) 137/95  Pulse: (!) 110 (!) 109  Resp: 20   Temp: 98.4 F (36.9 C)   SpO2: 99% 94%   Filed Weights   06/28/24 1520 06/29/24 0500 06/30/24 0500  Weight: 197 lb 8.5 oz (89.6 kg) 204 lb 5.9 oz (92.7 kg) 198 lb 6.6 oz (90 kg)    GENERAL: alert, no distress and comfortable SKIN: +Slightly jaundiced skin color, texture, turgor are normal, no rashes or significant lesions EYES: normal, conjunctiva are pink and non-injected, sclera clear OROPHARYNX: no exudate, no erythema and lips, buccal mucosa, and tongue normal  NECK: supple, thyroid  normal size, non-tender, without nodularity LYMPH: no palpable lymphadenopathy in the cervical, axillary or inguinal LUNGS: clear to auscultation and percussion with normal breathing effort HEART: regular rate & rhythm and no murmurs and no lower extremity edema ABDOMEN: abdomen soft, non-tender and normal bowel sounds MUSCULOSKELETAL: no cyanosis of digits and no clubbing  PSYCH: alert & oriented x 3 with fluent speech NEURO: no focal motor/sensory deficits   All questions were answered. The patient knows to call the clinic with any problems, questions or concerns.   The total time spent in the appointment was 40 minutes encounter with patient including review of chart and various tests results, discussions about plan of care and coordination of care plan  Olam JINNY Brunner, NP 06/30/2024 1:32 PM    Labs Reviewed:  Lab Results  Component Value Date   WBC 16.5 (H) 06/30/2024   HGB 10.1 (L) 06/30/2024   HCT 32.3 (L) 06/30/2024   MCV 73.9 (L) 06/30/2024   PLT 216 06/30/2024  Recent Labs    06/28/24 0111 06/29/24 1022 06/30/24 0950  NA 133* 133* 139  K 4.2 4.2 3.9  CL 102 109 107  CO2 19* 15* 19*  GLUCOSE 208* 143* 193*  BUN 32* 31* 39*  CREATININE 1.96* 2.10* 2.36*  CALCIUM  8.5* 8.1* 8.3*  GFRNONAA 35* 32* 28*  PROT 7.9 7.2 6.5  ALBUMIN  2.2* 1.8* 1.6*  AST 28 22 32  ALT 19 14 16   ALKPHOS 139* 113 109  BILITOT 1.2  2.0* 1.4*    Studies Reviewed:  CT CHEST WO CONTRAST Result Date: 06/29/2024 CLINICAL DATA:  Pneumonia suspected, uncomplicated, no prior imaging (Ped >= 68mo). History of urinary bladder cancer. EXAM: CT CHEST WITHOUT CONTRAST TECHNIQUE: Multidetector CT imaging of the chest was performed following the standard protocol without IV contrast. RADIATION DOSE REDUCTION: This exam was performed according to the departmental dose-optimization program which includes automated exposure control, adjustment of the mA and/or kV according to patient size and/or use of iterative reconstruction technique. COMPARISON:  PET CT 06/13/2024, CT chest 07/30/2024, chest x-ray 06/29/2024 FINDINGS: Cardiovascular: Normal heart size. No significant pericardial effusion. The thoracic aorta is normal in caliber. Severe atherosclerotic plaque of the thoracic aorta. Four-vessel coronary artery calcifications. Amplatzer device noted along the left atria. Mediastinum/Nodes: No gross hilar adenopathy, noting limited sensitivity for the detection of hilar adenopathy on this noncontrast study. Persistent multiple prominent but nonenlarged mediastinal lymph nodes. No enlarged mediastinal or axillary lymph nodes. Thyroid  gland, trachea, and esophagus demonstrate no significant findings. Lungs/Pleura: Interval development of left upper lobe patchy ground-glass airspace opacities more prominent along the dependent level. Similar findings along the left lower lobe. Redemonstration of chronic several scattered calcified micronodules along the bilateral lungs. Mild interlobular septal wall thickening. Along the apices. No pulmonary mass. Trace bilateral, left greater than right, pleural effusions. No pneumothorax. Upper Abdomen: Fluid dense lesion along the left superior renal pole suggestive of a simple renal cyst. Simple renal cysts, in the absence of clinically indicated signs/symptoms, require no independent follow-up. Atherosclerotic plaque.  Musculoskeletal: No abdominal wall hernia or abnormality.  Bilateral gynecomastia. No suspicious lytic or blastic osseous lesions. No acute displaced fracture. Multilevel degenerative changes of the spine. IMPRESSION: 1. Mild pulmonary edema with left upper lobe patchy ground-glass airspace opacities more prominent along the dependent level. Similar findings along the left lower lobe. Finding could represent superimposed developing infection/inflammation. 2. Trace bilateral, left greater than right, pleural effusions. 3. Redemonstration of colonic several scattered calcified micronodules along the bilateral lungs. Electronically Signed   By: Morgane  Naveau M.D.   On: 06/29/2024 13:58   DG CHEST PORT 1 VIEW Result Date: 06/29/2024 EXAM: 1 VIEW XRAY OF THE CHEST 06/29/2024 10:09:00 AM COMPARISON: 06/28/2024 CLINICAL HISTORY: Fluid excess. FINDINGS: LUNGS AND PLEURA: Interval increase in diffuse interstitial markings. No consolidative change. No significant pleural effusion or pneumothorax. HEART AND MEDIASTINUM: No acute abnormality of the cardiac and mediastinal silhouettes. Aortic atherosclerotic calcification. BONES AND SOFT TISSUES: No acute osseous abnormality. IMPRESSION: 1. Interval increase in diffuse interstitial markings. Favor pulmonary edema versus atypical infection. 2. No significant pleural effusion or pneumothorax. 3. No consolidative change. Electronically signed by: Waddell Calk MD 06/29/2024 10:15 AM EDT RP Workstation: [id number]   CT Cervical Spine Wo Contrast Result Date: 06/28/2024 CLINICAL DATA:  Fall EXAM: CT CERVICAL SPINE WITHOUT CONTRAST TECHNIQUE: Multidetector CT imaging of the cervical spine was performed without intravenous contrast. Multiplanar CT image reconstructions were also generated. RADIATION DOSE REDUCTION: This exam was performed according to the departmental  dose-optimization program which includes automated exposure control, adjustment of the mA and/or kV according to  patient size and/or use of iterative reconstruction technique. COMPARISON:  None Available. FINDINGS: Alignment: No subluxation Skull base and vertebrae: No acute fracture. No primary bone lesion or focal pathologic process. Soft tissues and spinal canal: No prevertebral fluid or swelling. No visible canal hematoma. Disc levels: Moderate degenerative disc disease with disc space narrowing and spurring. Advanced degenerative facet disease, left greater than right. Multilevel bilateral neural foraminal narrowing. Upper chest: No acute findings Other: None IMPRESSION: Moderate to advanced degenerative changes as above. No acute bony abnormality. Electronically Signed   By: Franky Crease M.D.   On: 06/28/2024 01:06   CT Head Wo Contrast Result Date: 06/28/2024 CLINICAL DATA:  Fall EXAM: CT HEAD WITHOUT CONTRAST TECHNIQUE: Contiguous axial images were obtained from the base of the skull through the vertex without intravenous contrast. RADIATION DOSE REDUCTION: This exam was performed according to the departmental dose-optimization program which includes automated exposure control, adjustment of the mA and/or kV according to patient size and/or use of iterative reconstruction technique. COMPARISON:  05/25/2024 FINDINGS: Brain: No acute intracranial abnormality. Specifically, no hemorrhage, hydrocephalus, mass lesion, acute infarction, or significant intracranial injury. Vascular: No hyperdense vessel or unexpected calcification. Skull: No acute calvarial abnormality. Sinuses/Orbits: No acute findings Other: None IMPRESSION: No acute intracranial abnormality. Electronically Signed   By: Franky Crease M.D.   On: 06/28/2024 01:04   DG Chest Port 1 View Result Date: 06/28/2024 CLINICAL DATA:  Questionable sepsis.  Fall. EXAM: PORTABLE CHEST 1 VIEW COMPARISON:  11/29/2023 FINDINGS: Heart and mediastinal contours are within normal limits. No confluent airspace opacities or effusions. No acute bony abnormality. IMPRESSION:  No active disease. Electronically Signed   By: Franky Crease M.D.   On: 06/28/2024 00:57   IR NEPHROSTOMY EXCHANGE LEFT Result Date: 06/27/2024 INDICATION: Indwelling left percutaneous nephrostomy tube. 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 EXAM: LEFT PERCUTANEOUS NEPHROSTOMY TUBE EXCHANGE UNDER FLUOROSCOPY INCLUDING NEPHROSTOGRAM COMPARISON:  None Available. MEDICATIONS: None ANESTHESIA/SEDATION: None CONTRAST:  30 mL Omnipaque  300-administered into the collecting system(s) FLUOROSCOPY: Radiation Exposure Index (as provided by the fluoroscopic device): 53 mGy Kerma COMPLICATIONS: None immediate. PROCEDURE: Informed written consent was obtained from the patient after a thorough discussion of the procedural risks, benefits and alternatives. All questions were addressed. Maximal Sterile Barrier Technique was utilized including caps, mask, sterile gowns, sterile gloves, sterile drape, hand hygiene and skin antiseptic. A timeout was performed prior to the initiation of the procedure. Contrast was injected via the pre-existing left nephrostomy tube and fluoroscopic imaging safe. The nephrostomy tube was cut and removed over a guidewire. A 5 [place] Kumpe catheter was advanced into the left ureter and additional contrast injection performed and imaging performed of the entire left ureter into the pelvis. A new 10 French nephrostomy tube was then advanced over the wire and formed at the level of the left renal pelvis. A fluoroscopic image was saved to confirm tube position. The tube was then connected to a new gravity drainage bag and secured at the exit site with a Prolene retention suture and StatLock device. FINDINGS: Nephrostogram demonstrates poor antegrade flow of contrast down the ureter. Additional catheter injection of the left ureter shows ureteral dilatation and evidence of high-grade ureteral obstruction in the pelvis just above the UVJ. A guidewire would not cross  into the bladder lumen. The replaced nephrostomy tube will be left to gravity drainage. IMPRESSION: Left percutaneous nephrostomy tube  exchange under fluoroscopy. Detailed nephrostogram performed today including evaluation of the entire ureter demonstrates high-grade obstruction of the distal ureter at the ureterovesical junction. This could not be crossed with a guidewire and the nephrostomy tube was replaced and will be left to gravity bag drainage. Electronically Signed   By: Marcey Moan M.D.   On: 06/27/2024 16:45   NM PET Image Initial (PI) Skull Base To Thigh Result Date: 06/18/2024 CLINICAL DATA:  Initial treatment strategy for bladder cancer. EXAM: NUCLEAR MEDICINE PET SKULL BASE TO THIGH TECHNIQUE: 9.41 mCi F-18 FDG was injected intravenously. Full-ring PET imaging was performed from the skull base to thigh after the radiotracer. CT data was obtained and used for attenuation correction and anatomic localization. Fasting blood glucose: 170 mg/dl COMPARISON:  CT scans 92/86/7974 FINDINGS: Mediastinal blood pool activity: SUV max 2.7 Liver activity: SUV max NA NECK: Enlarged and hypermetabolic left supraclavicular and subclavicular adenopathy. Supraclavicular node measures 12 mm on image 47/4 and has an SUV max of 5.6. Left subclavicular node measures 12 mm on image 54/4 in has an SUV max of 6.5. Incidental CT findings: Bilateral carotid artery calcifications. CHEST: Small scattered mediastinal and prevascular lymph nodes demonstrating low level FDG uptake. Prevascular node measures 7 mm and SUV max is 2.9. Lymph node between the aorta and esophagus has an SUV max of 4.0. These nodes are unchanged since the chest CT from 07/31/2023 and are likely reactive/inflammatory. No hypermetabolic pulmonary lesions. Numerous small scattered calcified and noncalcified pulmonary nodules are indeterminate finding but appears stable since a prior CT scan from 07/31/2023 suggesting a benign process. Attention on future  studies is suggested. Incidental CT findings: Stable atherosclerotic calcifications involving the aorta and coronary arteries. Left atrial closure device noted. ABDOMEN/PELVIS: Extensive retroperitoneal and pelvic adenopathy as demonstrated on the prior CT scan. These nodes are hypermetabolic. Left para-aortic nodal group have an SUV max of 11.9. Right para-aortic nodal group has an SUV max of 10.5. Right common iliac nodes have an SUV max of 10.3 and left common iliac nodes have an SUV max of 7.3. Left-sided external iliac nodal disease has an SUV max of 5.9. Right external iliac nodal disease has an SUV max of 6.3. There is a 11 mm right-sided pelvic nodes adjacent to the rectum which is hypermetabolic with SUV max of 3.8. No inguinal adenopathy. No findings for hepatic or adrenal gland metastasis. No discrete hypermetabolic bladder lesion is identified. Incidental CT findings: The right kidney is surgically absent. The left kidney contains a nephrostomy tube. Aortic and branch vessel atherosclerotic disease appears relatively stable. SKELETON: No findings suspicious for osseous metastatic disease. Incidental CT findings: No lytic or sclerotic bone lesions. Degenerative changes involving the spine. IMPRESSION: 1. Extensive hypermetabolic retroperitoneal and pelvic adenopathy consistent with metastatic disease. 2. Hypermetabolic left supraclavicular and subclavicular adenopathy consistent with metastatic disease. 3. No discrete hypermetabolic bladder lesion is identified. 4. Numerous small scattered calcified and noncalcified pulmonary nodules are indeterminate finding but appears stable since a prior CT scan from 07/31/2023 suggesting a benign process. Attention on future studies is suggested. 5. Borderline enlarged mediastinal and hilar nodes demonstrating low level hypermetabolism. These are stable in size since a CT scan from 07/31/2023 and are likely benign/reactive. 6. Aortic atherosclerosis. Aortic  Atherosclerosis (ICD10-I70.0). Electronically Signed   By: MYRTIS Stammer M.D.   On: 06/18/2024 17:03   IR UNLISTED ABDOMINAL PROCEDURE Result Date: 06/13/2024 Katha Harlene ORN, RT     06/13/2024  9:48 AM Patient presented to IR today with  complaints of left nephrostomy tube drainage bag leaking.  Nephrostomy tube dressing was removed and noted to be dry, tube was assessed and noted that suture was no longer intact.  Nephrostomy tube was still draining adequately into drainage bag but bag was noted to be leaking.  The nephrostomy tube was flushed and there was no leaking noted around the tube.  A new drainage bag was attached to the nephrostomy tube and a new dressing, including a stat-loc securement device was placed to hold tube in place due to suture being broken. Patient was instructed to call IR should any additional concerns arise regarding nephrostomy tube or drainage bag.  Patient was also instructed to return on Wednesday, June 18, 2024 at 3:30pm for previously scheduled routine nephrostomy tube exchange.   Addendum I have seen the patient, examined him. I agree with the assessment and and plan and have edited the notes.   Patient is well-known to me, under my care for his recently diagnosed metastatic urothelial carcinoma.  He Started [facility] therapy Padcev  and Keytruda  last week, unfortunately was admitted 2 days after with sepsis, likely from UTI and nephrostomy.  He is feeling better, but a very weak and deconditioned.  I spoke with his wife and daughter also.  Due to his poor PS, his overall prognosis is very poor.  We again reviewed the incurable nature of his metastatic disease and the goal of care is palliative.  Since he just started his treatment, if he is not able to recover well from this sepsis, I would likely offer more treatment, to see if his overall condition improves with cancer treatment.  However if his overall condition continued to deteriorate, it is also reasonable to consider  supportive care and hospice.  I discussed the CODE STATUS, patient seems to be open to DNR, however his wife is not ready.  Will give with him time to think about it.  I will follow-up as needed in the hospital.  I reviewed the above with hospitalist Dr. Royal.  Onita Mattock MD 06/30/2024

## 2024-06-30 NOTE — Progress Notes (Signed)
 [facility]  Date of Admission:  06/27/2024     Reason for Follow Up: Sepsis secondary to UTI [facility])  Total days of antibiotics 4         ASSESSMENT:  Adam Harris is a 78 y/o caucasian male admitted with altered mental status s/p nephrostomy tube exchange and found to have MRSA bacteremia and MRSA and Enterococcus faecalis in the urine.   Adam Harris is tolerating antibiotics with no adverse side effects. Discussed plan of care to obtain blood cultures for clearance of bacteremia and TTE to check for endocarditis. May need TEE. Discontinue ceftriaxone  given no additional gram negative growth following culture on 06/27/24. Continue current dose of daptomycin  with therapeutic drug monitoring of CK levels. Recommend Urology evaluation to determine need for nephrostomy exchange given MRSA bacteremia and bacteriuria.  Contact precautions for MRSA. Remaining medical and supportive care per Internal Medicine.   PLAN:  Continue current dose of Daptomycin  Discontinue ceftriaxone .  Blood cultures for clearance of bacteremia  TTE to check for endocarditis. Recommend Urology evaluation to determine need for nephrostomy tube exchange in the setting of bacteremia/bacteriuria Contact precautions for MRSA.  Remaining medical and supportive care per Internal Medicine.    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   Permanent atrial fibrillation (HCC)   Nephrostomy present (HCC)   Generalized weakness   Type 2 diabetes mellitus with hyperglycemia (HCC)   Moderate protein-calorie malnutrition (HCC)   Fall   Microcytic anemia    artificial tears  2 drop Both Eyes QID   aspirin  EC  81 mg Oral Daily   enoxaparin  (LOVENOX ) injection  40 mg  Subcutaneous Q24H   feeding supplement  237 mL Oral BID BM   insulin  aspart  0-9 Units Subcutaneous Q4H   [START ON 07/01/2024] isosorbide  mononitrate  15 mg Oral Daily   linaclotide   145 mcg Oral QAC breakfast   metoprolol  tartrate  25 mg Oral TID   sodium bicarbonate   650 mg Oral BID   sodium chloride  flush  3 mL Intravenous Q12H   tamsulosin   0.4 mg Oral Daily    SUBJECTIVE:  Afebrile overnight with no acute events. Feeling better. Tolerating antibiotics with no adverse side effects.   Allergies  Allergen Reactions   Xarelto  [Rivaroxaban ] Other (See Comments)    Skin Blisters      Review of Systems: Review of Systems  Constitutional:  Negative for chills, fever and weight loss.  Respiratory:  Negative for cough, shortness of breath and wheezing.   Cardiovascular:  Negative for chest pain and leg swelling.  Gastrointestinal:  Negative for abdominal pain, constipation, diarrhea, nausea and vomiting.  Skin:  Negative for rash.      OBJECTIVE: Vitals:   06/30/24 0400 06/30/24 0500 06/30/24 0824 06/30/24 1232  BP: (!) 136/94  (!) 123/93 (!) 137/95  Pulse: 90  (!) 110 (!) 109  Resp:   20   Temp: 98.3 F (36.8 C)  98.4 F (36.9 C)   TempSrc: Oral     SpO2: 99%  99% 94%  Weight:  90 kg    Height:       Body mass index is 27.67 kg/m.  Physical Exam Constitutional:      General: He is  not in acute distress.    Appearance: He is well-developed.  Cardiovascular:     Rate and Rhythm: Regular rhythm. Tachycardia present.     Heart sounds: Normal heart sounds.  Pulmonary:     Effort: Pulmonary effort is normal.     Breath sounds: Normal breath sounds.  Skin:    General: Skin is warm and dry.  Neurological:     Mental Status: He is alert.  Psychiatric:        Mood and Affect: Mood normal.     Lab Results Lab Results  Component Value Date   WBC 16.5 (H) 06/30/2024   HGB 10.1 (L) 06/30/2024   HCT 32.3 (L) 06/30/2024   MCV 73.9 (L) 06/30/2024   PLT 216  06/30/2024    Lab Results  Component Value Date   CREATININE 2.36 (H) 06/30/2024   BUN 39 (H) 06/30/2024   NA 139 06/30/2024   K 3.9 06/30/2024   CL 107 06/30/2024   CO2 19 (L) 06/30/2024    Lab Results  Component Value Date   ALT 16 06/30/2024   AST 32 06/30/2024   ALKPHOS 109 06/30/2024   BILITOT 1.4 (H) 06/30/2024     Microbiology: Recent Results (from the past 240 hours)  Urine Culture     Status: Abnormal   Collection Time: 06/27/24 12:00 AM  Result Value Ref Range Status   MICRO NUMBER: [id number]  Final   SPECIMEN QUALITY: Adequate  Final   Sample Source URINE  Final   STATUS: FINAL  Final   ISOLATE 1: Proteus mirabilis (A)  Final    Comment: Greater than 100,000 CFU/mL of Proteus mirabilis      Susceptibility   Proteus mirabilis - URINE CULTURE, REFLEX    AMOX/CLAVULANIC <=2 Sensitive     AMPICILLIN/SULBACTAM <=2 Sensitive     CEFAZOLIN * >=32 Resistant      * For uncomplicated UTI caused by E. coli, K. pneumoniae or P. mirabilis: Cefazolin  is susceptible if MIC <32 mcg/mL and predicts susceptible to the oral agents cefaclor, cefdinir, cefpodoxime, cefprozil, cefuroxime, cephalexin  and loracarbef.     CEFTAZIDIME <=0.5 Sensitive     CEFEPIME  4 Intermediate     CEFTRIAXONE  <=0.25 Sensitive     CIPROFLOXACIN  <=0.06 Sensitive     LEVOFLOXACIN <=0.12 Sensitive     GENTAMICIN <=1 Sensitive     MEROPENEM 1 Sensitive     NITROFURANTOIN  128 Resistant     PIP/TAZO <=4 Sensitive     TRIMETH /SULFA * <=20 Sensitive      * For uncomplicated UTI caused by E. coli, K. pneumoniae or P. mirabilis: Cefazolin  is susceptible if MIC <32 mcg/mL and predicts susceptible to the oral agents cefaclor, cefdinir, cefpodoxime, cefprozil, cefuroxime, cephalexin  and loracarbef. Legend: S = Susceptible  I = Intermediate R = Resistant  NS = Not susceptible SDD = Susceptible Dose Dependent * = Not Tested  NR = Not Reported **NN = See Therapy Comments   Blood Culture (routine x 2)      Status: Abnormal   Collection Time: 06/28/24  1:10 AM   Specimen: BLOOD RIGHT ARM  Result Value Ref Range Status   Specimen Description BLOOD RIGHT ARM  Final   Special Requests   Final    BOTTLES DRAWN AEROBIC AND ANAEROBIC Blood Culture adequate volume   Culture  Setup Time   Final    GRAM POSITIVE COCCI IN CLUSTERS AEROBIC BOTTLE ONLY CRITICAL VALUE NOTED.  VALUE IS CONSISTENT WITH PREVIOUSLY REPORTED AND CALLED VALUE.  Culture (A)  Final    STAPHYLOCOCCUS AUREUS SUSCEPTIBILITIES PERFORMED ON PREVIOUS CULTURE WITHIN THE LAST 5 DAYS. Performed at [facility], [address] [place], [place] [address]    Report Status 06/30/2024 FINAL  Final  Urine Culture     Status: Abnormal   Collection Time: 06/28/24  1:11 AM   Specimen: Urine, Random  Result Value Ref Range Status   Specimen Description URINE, RANDOM  Final   Special Requests   Final    NONE Reflexed from [id number] Performed at [facility], [address] [place], [place] [address]    Culture (A)  Final    >=100,000 COLONIES/mL METHICILLIN RESISTANT STAPHYLOCOCCUS AUREUS >=100,000 COLONIES/mL ENTEROCOCCUS FAECALIS    Report Status 06/30/2024 FINAL  Final   Organism ID, Bacteria METHICILLIN RESISTANT STAPHYLOCOCCUS AUREUS (A)  Final   Organism ID, Bacteria ENTEROCOCCUS FAECALIS (A)  Final      Susceptibility   Enterococcus faecalis - MIC*    AMPICILLIN <=2 SENSITIVE Sensitive     NITROFURANTOIN  <=16 SENSITIVE Sensitive     VANCOMYCIN  1 SENSITIVE Sensitive     * >=100,000 COLONIES/mL ENTEROCOCCUS FAECALIS   Methicillin resistant staphylococcus aureus - MIC*    CIPROFLOXACIN  >=8 RESISTANT Resistant     GENTAMICIN <=0.5 SENSITIVE Sensitive     NITROFURANTOIN  <=16 SENSITIVE Sensitive     OXACILLIN >=4 RESISTANT Resistant     TETRACYCLINE <=1 SENSITIVE Sensitive     VANCOMYCIN  1 SENSITIVE Sensitive     TRIMETH /SULFA  >=320 RESISTANT Resistant     RIFAMPIN <=0.5 SENSITIVE Sensitive     Inducible  Clindamycin NEGATIVE Sensitive     LINEZOLID 2 SENSITIVE Sensitive     * >=100,000 COLONIES/mL METHICILLIN RESISTANT STAPHYLOCOCCUS AUREUS  Blood Culture (routine x 2)     Status: Abnormal (Preliminary result)   Collection Time: 06/28/24  2:25 AM   Specimen: BLOOD LEFT ARM  Result Value Ref Range Status   Specimen Description BLOOD LEFT ARM  Final   Special Requests   Final    BOTTLES DRAWN AEROBIC AND ANAEROBIC Blood Culture adequate volume   Culture  Setup Time   Final    GRAM POSITIVE COCCI IN BOTH AEROBIC AND ANAEROBIC BOTTLES CRITICAL RESULT CALLED TO, READ BACK BY AND VERIFIED WITH: MAYA OZELL JAMAICA [id number] 1911 BY J RAZZAK,MT Performed at [facility], [address], [place], [place] [address]    Culture METHICILLIN RESISTANT STAPHYLOCOCCUS AUREUS (A)  Final   Report Status PENDING  Incomplete   Organism ID, Bacteria METHICILLIN RESISTANT STAPHYLOCOCCUS AUREUS  Final      Susceptibility   Methicillin resistant staphylococcus aureus - MIC*    CIPROFLOXACIN  >=8 RESISTANT Resistant     ERYTHROMYCIN <=0.25 SENSITIVE Sensitive     GENTAMICIN <=0.5 SENSITIVE Sensitive     OXACILLIN >=4 RESISTANT Resistant     TETRACYCLINE <=1 SENSITIVE Sensitive     VANCOMYCIN  1 SENSITIVE Sensitive     TRIMETH /SULFA  >=320 RESISTANT Resistant     CLINDAMYCIN <=0.25 SENSITIVE Sensitive     RIFAMPIN <=0.5 SENSITIVE Sensitive     Inducible Clindamycin NEGATIVE Sensitive     LINEZOLID 2 SENSITIVE Sensitive     * METHICILLIN RESISTANT STAPHYLOCOCCUS AUREUS  Blood Culture ID Panel (Reflexed)     Status: Abnormal   Collection Time: 06/28/24  2:25 AM  Result Value Ref Range Status   Enterococcus faecalis NOT DETECTED NOT DETECTED Final   Enterococcus Faecium NOT DETECTED NOT DETECTED Final   Listeria monocytogenes NOT  DETECTED NOT DETECTED Final   Staphylococcus species DETECTED (A) NOT DETECTED Final    Comment: CRITICAL RESULT CALLED TO, READ BACK BY AND VERIFIED WITH: PHARMD MICHAEL  BITONTI [id number] 1911 BY J RAZZAK, MT    Staphylococcus aureus (BCID) DETECTED (A) NOT DETECTED Final    Comment: Methicillin (oxacillin)-resistant Staphylococcus aureus (MRSA). MRSA is predictably resistant to beta-lactam antibiotics (except ceftaroline). Preferred therapy is vancomycin  unless clinically contraindicated. Patient requires contact precautions if  hospitalized. CRITICAL RESULT CALLED TO, READ BACK BY AND VERIFIED WITH: PHARMD MICHAEL BITONTI [id number] 1911 BY J RAZZAK, MT    Staphylococcus epidermidis NOT DETECTED NOT DETECTED Final   Staphylococcus lugdunensis NOT DETECTED NOT DETECTED Final   Streptococcus species NOT DETECTED NOT DETECTED Final   Streptococcus agalactiae NOT DETECTED NOT DETECTED Final   Streptococcus pneumoniae NOT DETECTED NOT DETECTED Final   Streptococcus pyogenes NOT DETECTED NOT DETECTED Final   A.calcoaceticus-baumannii NOT DETECTED NOT DETECTED Final   Bacteroides fragilis NOT DETECTED NOT DETECTED Final   Enterobacterales NOT DETECTED NOT DETECTED Final   Enterobacter cloacae complex NOT DETECTED NOT DETECTED Final   Escherichia coli NOT DETECTED NOT DETECTED Final   Klebsiella aerogenes NOT DETECTED NOT DETECTED Final   Klebsiella oxytoca NOT DETECTED NOT DETECTED Final   Klebsiella pneumoniae NOT DETECTED NOT DETECTED Final   Proteus species NOT DETECTED NOT DETECTED Final   Salmonella species NOT DETECTED NOT DETECTED Final   Serratia marcescens NOT DETECTED NOT DETECTED Final   Haemophilus influenzae NOT DETECTED NOT DETECTED Final   Neisseria meningitidis NOT DETECTED NOT DETECTED Final   Pseudomonas aeruginosa NOT DETECTED NOT DETECTED Final   Stenotrophomonas maltophilia NOT DETECTED NOT DETECTED Final   Candida albicans NOT DETECTED NOT DETECTED Final   Candida auris NOT DETECTED NOT DETECTED Final   Candida glabrata NOT DETECTED NOT DETECTED Final   Candida krusei NOT DETECTED NOT DETECTED Final   Candida parapsilosis NOT DETECTED  NOT DETECTED Final   Candida tropicalis NOT DETECTED NOT DETECTED Final   Cryptococcus neoformans/gattii NOT DETECTED NOT DETECTED Final   Meth resistant mecA/C and MREJ DETECTED (A) NOT DETECTED Final    Comment: CRITICAL RESULT CALLED TO, READ BACK BY AND VERIFIED WITH: MAYA OZELL JAMAICA [phone] 1911 BY JINNY COMMON, MT Performed at [facility], [address], [place], [place] [address]     I have personally spent 28 minutes involved in face-to-face and non-face-to-face activities for this patient on the day of the visit. Professional time spent includes the following activities: preparing to see the patient (review of tests), performing a medically appropriate examination, ordering medications, communicating with other health care professionals, documenting clinical information in the EMR, communicating results and counseling patient regarding medication and plan of care, and care coordination.    Greg Zuley Lutter, NP [facility]  06/30/2024  3:10 PM

## 2024-06-30 NOTE — Progress Notes (Signed)
 Pharmacy Antibiotic Note  Adam Harris is a 78 y.o. male admitted on 06/27/2024 with fall and UTI found to have MRSA bacteremia. Pharmacy has been consulted for Daptomycin  dosing.  Baseline CK check on Dapto resulted at 40 and wnl. SCr up to 2.36 today, will adjust Daptomcyin to q48h  Plan: - Adjust Daptomycin  to 700 mg IV every 48 hours - CK checks qMon - Will monitor for clearance and additional work-up  Height: 5' 11 (180.3 cm) Weight: 90 kg (198 lb 6.6 oz) IBW/kg (Calculated) : 75.3  Temp (24hrs), Avg:98.4 F (36.9 C), Min:97.5 F (36.4 C), Max:100.3 F (37.9 C)  Recent Labs  Lab 06/25/24 1023 06/27/24 1406 06/28/24 0111 06/28/24 1613 06/28/24 2016 06/28/24 2312 06/29/24 1022 06/29/24 1034 06/29/24 1357  WBC 18.7* 21.5* 27.2*  --   --   --  23.9*  --   --   CREATININE 1.94* 1.68* 1.96*  --   --   --  2.10*  --   --   LATICACIDVEN  --   --   --  3.1* 2.7* 2.1*  --  2.3* 3.2*    Estimated Creatinine Clearance: 31.4 mL/min (A) (by C-G formula based on SCr of 2.1 mg/dL (H)).    Allergies  Allergen Reactions   Xarelto  [Rivaroxaban ] Other (See Comments)    Skin Blisters    Vanc 8/16 >> 8/17 Zosyn  8/16 >> 8/17 Dapto 8/17 >> CRO 8/17 >>  8/16 BCx: MRSA 8/16 UCx: Staph aureus, Enterococcus faecalis  Thank you for allowing pharmacy to be a part of this patient's care.  Almarie Lunger, PharmD, BCPS, BCIDP Infectious Diseases Clinical Pharmacist 06/30/2024 2:46 PM   **Pharmacist phone directory can now be found on [email or web address] ([email or web address]).  Listed under [facility].

## 2024-07-01 DIAGNOSIS — N390 Urinary tract infection, site not specified: Secondary | ICD-10-CM | POA: Diagnosis not present

## 2024-07-01 DIAGNOSIS — A419 Sepsis, unspecified organism: Secondary | ICD-10-CM | POA: Diagnosis not present

## 2024-07-01 LAB — COMPREHENSIVE METABOLIC PANEL WITH GFR
ALT: 23 U/L (ref 0–44)
AST: 41 U/L (ref 15–41)
Albumin: 1.8 g/dL — ABNORMAL LOW (ref 3.5–5.0)
Alkaline Phosphatase: 130 U/L — ABNORMAL HIGH (ref 38–126)
Anion gap: 8 (ref 5–15)
BUN: 36 mg/dL — ABNORMAL HIGH (ref 8–23)
CO2: 19 mmol/L — ABNORMAL LOW (ref 22–32)
Calcium: 8.1 mg/dL — ABNORMAL LOW (ref 8.9–10.3)
Chloride: 108 mmol/L (ref 98–111)
Creatinine, Ser: 2.09 mg/dL — ABNORMAL HIGH (ref 0.61–1.24)
GFR, Estimated: 32 mL/min — ABNORMAL LOW (ref >60)
Glucose, Bld: 104 mg/dL — ABNORMAL HIGH (ref 70–99)
Potassium: 3.7 mmol/L (ref 3.5–5.1)
Sodium: 135 mmol/L (ref 135–145)
Total Bilirubin: 1.3 mg/dL — ABNORMAL HIGH (ref 0.0–1.2)
Total Protein: 7 g/dL (ref 6.5–8.1)

## 2024-07-01 LAB — CBC WITH DIFFERENTIAL/PLATELET
Basophils Absolute: 0 K/uL (ref 0.0–0.1)
Basophils Relative: 0 %
Eosinophils Absolute: 0 K/uL (ref 0.0–0.5)
Eosinophils Relative: 0 %
HCT: 33.4 % — ABNORMAL LOW (ref 39.0–52.0)
Hemoglobin: 10.4 g/dL — ABNORMAL LOW (ref 13.0–17.0)
Lymphocytes Relative: 2 %
Lymphs Abs: 0.2 K/uL — ABNORMAL LOW (ref 0.7–4.0)
MCH: 23.1 pg — ABNORMAL LOW (ref 26.0–34.0)
MCHC: 31.1 g/dL (ref 30.0–36.0)
MCV: 74.1 fL — ABNORMAL LOW (ref 80.0–100.0)
Monocytes Absolute: 0.5 K/uL (ref 0.1–1.0)
Monocytes Relative: 4 %
Neutro Abs: 10.9 K/uL — ABNORMAL HIGH (ref 1.7–7.7)
Neutrophils Relative %: 94 %
Platelets: 212 K/uL (ref 150–400)
RBC: 4.51 MIL/uL (ref 4.22–5.81)
RDW: 20.9 % — ABNORMAL HIGH (ref 11.5–15.5)
WBC: 11.6 K/uL — ABNORMAL HIGH (ref 4.0–10.5)
nRBC: 0 % (ref 0.0–0.2)

## 2024-07-01 LAB — GLUCOSE, CAPILLARY
Glucose-Capillary: 108 mg/dL — ABNORMAL HIGH (ref 70–99)
Glucose-Capillary: 110 mg/dL — ABNORMAL HIGH (ref 70–99)
Glucose-Capillary: 119 mg/dL — ABNORMAL HIGH (ref 70–99)
Glucose-Capillary: 133 mg/dL — ABNORMAL HIGH (ref 70–99)
Glucose-Capillary: 142 mg/dL — ABNORMAL HIGH (ref 70–99)
Glucose-Capillary: 146 mg/dL — ABNORMAL HIGH (ref 70–99)

## 2024-07-01 LAB — MAGNESIUM: Magnesium: 2 mg/dL (ref 1.7–2.4)

## 2024-07-01 MED ORDER — TRAZODONE HCL 50 MG PO TABS
25.0000 mg | ORAL_TABLET | Freq: Once | ORAL | Status: AC
  Administered 2024-07-01: 25 mg via ORAL
  Filled 2024-07-01: qty 1

## 2024-07-01 MED ORDER — SODIUM CHLORIDE 0.9 % IV SOLN
900.0000 mg | Freq: Every day | INTRAVENOUS | Status: DC
  Administered 2024-07-01 – 2024-07-10 (×10): 900 mg via INTRAVENOUS
  Filled 2024-07-01 (×11): qty 18

## 2024-07-01 MED ORDER — METOPROLOL TARTRATE 5 MG/5ML IV SOLN
2.5000 mg | Freq: Once | INTRAVENOUS | Status: AC
  Administered 2024-07-01: 2.5 mg via INTRAVENOUS
  Filled 2024-07-01: qty 5

## 2024-07-01 MED ORDER — OXYCODONE HCL 5 MG PO TABS
5.0000 mg | ORAL_TABLET | ORAL | Status: DC | PRN
  Administered 2024-07-01: 10 mg via ORAL
  Administered 2024-07-02 (×2): 5 mg via ORAL
  Filled 2024-07-01: qty 2
  Filled 2024-07-01: qty 1
  Filled 2024-07-01: qty 2

## 2024-07-01 MED ORDER — METOPROLOL TARTRATE 5 MG/5ML IV SOLN
2.5000 mg | Freq: Four times a day (QID) | INTRAVENOUS | Status: DC | PRN
  Administered 2024-07-06 – 2024-07-07 (×2): 2.5 mg via INTRAVENOUS
  Filled 2024-07-01 (×2): qty 5

## 2024-07-01 MED ORDER — GERHARDT'S BUTT CREAM
TOPICAL_CREAM | Freq: Two times a day (BID) | CUTANEOUS | Status: DC
  Filled 2024-07-01: qty 60

## 2024-07-01 MED ORDER — MIRTAZAPINE 15 MG PO TABS
15.0000 mg | ORAL_TABLET | Freq: Every day | ORAL | Status: DC
  Administered 2024-07-02 – 2024-07-09 (×8): 15 mg via ORAL
  Filled 2024-07-01 (×9): qty 1

## 2024-07-01 MED ORDER — DAPTOMYCIN-SODIUM CHLORIDE 700-0.9 MG/100ML-% IV SOLN
700.0000 mg | Freq: Every day | INTRAVENOUS | Status: DC
  Filled 2024-07-01: qty 100

## 2024-07-01 NOTE — Consult Note (Signed)
 Urology Consult   Physician requesting consult: Dr. Royal  Reason for consult: Left ureteral obstruction  History of Present Illness: Adam Harris is a 78 y.o. 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  He has a solitary left kidney with left PCN.  He had this interrogated about 4 days ago with evidence of complete obstruction of the left ureter distally.  New PCN placed at that time.  He subsequently became lethargic and less responsive at home according to the patient causing his wife to call 911.  Blood culture from 8/16 with MRSA bacteremia.    Past Medical History:  Diagnosis Date   Abnormal radiologic findings on diagnostic imaging of renal pelvis, ureter, or bladder    bilateral ureter abnormalities   Anticoagulant long-term use    eliquis    Anxiety    pt denies   Arthritis    Atrial fibrillation, chronic (HCC)    CAD (coronary artery disease) cardiologist-- dr hochrein   NSTEMI 02-04-2014  per cardiac cath chronic occluded RCA w/ faint left-to-right collaterals and aneurysmal LCFx with sluggish coronary flow/   NSTEMI --11-21-2016 per cardiac cath occluded proximal RCA & mid to diastal CFX 100%, med rx. If that does not work, PTCA or CABG   Cancer [facility])    bladder and kidney   CHF (congestive heart failure) (HCC)    CKD (chronic kidney disease), stage III (HCC)    patient unaware   DOE (dyspnea on exertion)    Fatty liver    pt denies   Hematuria 02/2019   History of COVID-19 10/2019   History of non-ST elevation myocardial infarction (NSTEMI)    02-04-2014  and 11-21-2016  cardiac cath done both times ,  medically management   History of shingles 12/2017   slight pain and numbness still noted in the area   Hyperlipidemia    Hypertension    Insomnia    Myocardial infarction [facility]) 2015   Persistent atrial fibrillation [facility])    cardiologsit-- dr hochrein   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 Kennyth   Thoracic aortic atherosclerosis (HCC)    Type 2 diabetes mellitus (HCC)    Urinary frequency     Past Surgical History:  Procedure Laterality Date   CARDIAC CATHETERIZATION N/A 11/21/2016   Procedure: Left Heart Cath and Coronary Angiography;  Surgeon: Debby DELENA Sor, MD;  Location: [facility] INVASIVE CV LAB;  Service: Cardiovascular;  Laterality: N/A;  pRCA 100% , ostial LAD 45%, OM3 80%, mCFX to dCFX 100% (AV groove), lateral OM3 50%   COLONOSCOPY     CYSTOSCOPY WITH BIOPSY N/A 08/12/2020   Procedure: CYSTOSCOPY WITH BLADDER BIOPSY AND TRANSURETHRAL RESECTION OF BLADDER TUMOR;  Surgeon: [name], MD;  Location: [facility] ORS;  Service: Urology;  Laterality: N/A;   CYSTOSCOPY WITH BIOPSY N/A 11/10/2021   Procedure: CYSTOSCOPY WITH BLADDER BIOPSIES/ LEFT RETROGRADE;  Surgeon: [name], MD;  Location: [facility];  Service: Urology;  Laterality: N/A;   CYSTOSCOPY WITH BIOPSY Left 07/27/2022   Procedure: CYSTOSCOPY WITH  BLADDER BIOPSY, TRANSURETHRAL FULGERATION OF BLADDER, EXAM UNDER ANESTHESIA, LEFT RETROGRADE PYELOGRAM;  Surgeon: [name], MD;  Location: [facility];  Service: Urology;  Laterality: Left;   CYSTOSCOPY WITH BIOPSY N/A 04/23/2023   Procedure: CYSTOSCOPY WITH BLADDER AND PROSTATIC URETHRAL BIOPSIES;  Surgeon: [name], MD;  Location: [facility];  Service: Urology;  Laterality: N/A;  60 MINUTES NEEDED FOR CASE  CYSTOSCOPY WITH BIOPSY N/A 03/06/2024   Procedure: CYSTOSCOPY, WITH BIOPSY;  Surgeon: [name], MD;  Location: [facility];  Service: Urology;  Laterality: N/A;  CYSTOSCOPY WITH BLADDER BIOPSIES   CYSTOSCOPY WITH FULGERATION N/A 03/06/2024   Procedure: CYSTOSCOPY, WITH BLADDER FULGURATION, TURBT;  Surgeon: [name], MD;  Location: [facility];  Service: Urology;  Laterality: N/A;   CYSTOSCOPY WITH URETEROSCOPY AND STENT PLACEMENT Right 12/15/2019   Procedure: CYSTOSCOPY WITH RIGHT RETROGRADE/ RIGHT URETEROSCOPY/ BIOPSY;  Surgeon: Ottelin, Mark, MD;   Location: [facility];  Service: Urology;  Laterality: Right;   CYSTOSCOPY/RETROGRADE/URETEROSCOPY Bilateral 03/18/2018   Procedure: CYSTOSCOPY/RETROGRADE/URETEROSCOPY.;  Surgeon: Ottelin, Mark, MD;  Location: [facility];  Service: Urology;  Laterality: Bilateral;   EYE SURGERY Bilateral    cateract in January 2023   HYDROCELE EXCISION Left 07/27/2022   Procedure: HYDROCELE REPAIR;  Surgeon: [name], MD;  [facility];  Service: Urology;  Laterality: Left;  GENERAL ANESTHESIA WITH PARALYSIS   IR FLUORO PROCEDURE UNLISTED  06/13/2024   IR NEPHROSTOMY EXCHANGE LEFT  03/10/2024   IR NEPHROSTOMY EXCHANGE LEFT  04/28/2024   IR NEPHROSTOMY EXCHANGE LEFT  06/27/2024   IR NEPHROSTOMY PLACEMENT LEFT  03/07/2024   KNEE ARTHROSCOPY Right    LEFT ATRIAL APPENDAGE OCCLUSION N/A 12/13/2023   Procedure: LEFT ATRIAL APPENDAGE OCCLUSION;  Surgeon: [name], MD;  Location: [facility] INVASIVE CV LAB;  Service: Cardiovascular;  Laterality: N/A;   LEFT HEART CATHETERIZATION WITH CORONARY ANGIOGRAM N/A 02/04/2014   Procedure: LEFT HEART CATHETERIZATION WITH CORONARY ANGIOGRAM;  Surgeon: Deatrice DELENA Cage, MD;  Location: [facility] CATH LAB;  Service: Cardiovascular;  Laterality: N/A;  severe one-vessel CAD, chronically occluded RCA with faint left-to-right collaterals;  aneurysmal LCFx with sluggish coronary flow;  normal LVSF w/ moderately elevated LVEDP (ostialOM2 20%, pOM3 20%, pD1 20%, mCFX 50%, diffuse 20% pCFX)   PROSTATE BIOPSY N/A 08/12/2020   Procedure: BIOPSY TRANSRECTAL ULTRASONIC PROSTATE (TUBP);  Surgeon: [name], MD;  [facility];  Service: Urology;  Laterality: N/A;   ROBOT ASSITED LAPAROSCOPIC NEPHROURETERECTOMY Right 03/22/2020   Procedure: XI ROBOT ASSITED LAPAROSCOPIC NEPHROURETERECTOMY;  Surgeon: [name], MD;  [facility]: [facility];  Service: Urology;  Laterality: Right;   TRANSESOPHAGEAL ECHOCARDIOGRAM (CATH LAB) N/A 12/13/2023   Procedure: TRANSESOPHAGEAL  ECHOCARDIOGRAM;  Surgeon: [name], MD;  Location: [facility] INVASIVE CV LAB;  Service: Cardiovascular;  Laterality: N/A;   TRANSESOPHAGEAL ECHOCARDIOGRAM (CATH LAB) N/A 02/11/2024   Procedure: TRANSESOPHAGEAL ECHOCARDIOGRAM;  Surgeon: Barbaraann Darryle Ned, MD;  Location: [facility] INVASIVE CV LAB;  Service: Cardiovascular;  Laterality: N/A;   TRANSTHORACIC ECHOCARDIOGRAM  11-22-2016   dr hochrein   ef 55-60%/ mild MR and TR/ moderate LAE   TRANSURETHRAL RESECTION OF BLADDER TUMOR N/A 02/10/2021   Procedure: TRANSURETHRAL RESECTION OF BLADDER TUMOR (TURBT)/ CYSTOSCOPY/ LEFT RETROGRADE;  Surgeon: [name], MD;  Location: [facility];  Service: Urology;  Laterality: N/A;  GENERAL ANESTHESIA WITH PARALYSIS    Current Hospital Medications:  Home Meds:  No current facility-administered medications on file prior to encounter.   Current Outpatient Medications on File Prior to Encounter  Medication Sig Dispense Refill   aspirin  EC 81 MG tablet Take 1 tablet (81 mg total) by mouth daily. Swallow whole. 30 tablet 12   Carboxymethylcell-Glycerin  PF 0.5-0.9 % SOLN Place 2 drops into both eyes 4 (four) times daily. 1 each 11   isosorbide  mononitrate (IMDUR ) 30 MG 24 hr tablet Take 1 tablet (30 mg total) by mouth daily.     linaclotide  (  LINZESS ) 145 MCG CAPS capsule Take 1 capsule (145 mcg total) by mouth daily before breakfast. 30 capsule 3   megestrol  ([name]  ES) 625 MG/5ML suspension Take 5 mLs (625 mg total) by mouth daily. 150 mL 0   metoprolol  tartrate (LOPRESSOR ) 25 MG tablet TAKE ONE TABLET BY MOUTH TWICE A DAY 180 tablet 2   mirtazapine  (REMERON ) 30 MG tablet Take 1 tablet (30 mg total) by mouth at bedtime. 30 tablet 0   olmesartan  (BENICAR ) 20 MG tablet Take 20 mg by mouth daily.     ondansetron  (ZOFRAN ) 8 MG tablet Take 1 tablet (8 mg total) by mouth every 8 (eight) hours as needed for nausea or vomiting. 30 tablet 1   [START ON 07/02/2024] oxyCODONE  (OXY IR/ROXICODONE ) 5 MG immediate release tablet  Take 1-2 tablets (5-10 mg total) by mouth every 4 (four) hours as needed for severe pain (pain score 7-10). 60 tablet 0   polyethylene glycol (MIRALAX  / GLYCOLAX ) 17 g packet Take 17 g by mouth daily as needed for mild constipation. 14 each 0   prochlorperazine  (COMPAZINE ) 10 MG tablet Take 1 tablet (10 mg total) by mouth every 6 (six) hours as needed for nausea or vomiting. 30 tablet 1   rosuvastatin  (CRESTOR ) 40 MG tablet TAKE 1 TABLET (40 MG TOTAL) BY MOUTH EVERY MORNING. PLEASE KEEP SCHEDULED APPOINTMENT 90 tablet 3   sitaGLIPtin  (JANUVIA ) 50 MG tablet Take 1 tablet (50 mg total) by mouth daily. 90 tablet 3   sodium bicarbonate  650 MG tablet Take 650 mg by mouth 2 (two) times daily.     hydrOXYzine  (VISTARIL ) 25 MG capsule Take 1 capsule (25 mg total) by mouth every 8 (eight) hours as needed. 30 capsule 2   lidocaine -prilocaine  (EMLA ) cream Apply to affected area once (Patient not taking: Reported on 06/28/2024) 30 g 3   tamsulosin  (FLOMAX ) 0.4 MG CAPS capsule TAKE ONE CAPSULE BY MOUTH DAILY (Patient not taking: Reported on 06/28/2024) 30 capsule 11   traZODone  (DESYREL ) 50 MG tablet Take 0.5-1 tablets (25-50 mg total) by mouth at bedtime as needed for sleep. (Patient not taking: Reported on 06/28/2024) 30 tablet 3     Scheduled Meds:  artificial tears  2 drop Both Eyes QID   aspirin  EC  81 mg Oral Daily   enoxaparin  (LOVENOX ) injection  40 mg Subcutaneous Q24H   feeding supplement  237 mL Oral BID BM   Gerhardt's butt cream   Topical BID   insulin  aspart  0-9 Units Subcutaneous Q4H   isosorbide  mononitrate  15 mg Oral Daily   linaclotide   145 mcg Oral QAC breakfast   metoprolol  tartrate  25 mg Oral TID   mirtazapine   15 mg Oral QHS   sodium bicarbonate   650 mg Oral BID   sodium chloride  flush  3 mL Intravenous Q12H   tamsulosin   0.4 mg Oral Daily   Continuous Infusions:  DAPTOmycin  900 mg (07/01/24 1156)   PRN Meds:.acetaminophen  **OR** acetaminophen , albuterol , metoprolol  tartrate,  ondansetron  **OR** ondansetron  (ZOFRAN ) IV, oxyCODONE , polyethylene glycol  Allergies:  Allergies  Allergen Reactions   Xarelto  [Rivaroxaban ] Other (See Comments)    Skin Blisters     Family History  Problem Relation Age of Onset   Hypertension Mother    Cancer Mother        anal cancer   Esophageal cancer Neg Hx    Pancreatic cancer Neg Hx    Stomach cancer Neg Hx    Liver disease Neg Hx     Social  History:  reports that he quit smoking about 53 years ago. His smoking use included cigarettes. He started smoking about 57 years ago. He has a 1 pack-year smoking history. He has been exposed to tobacco smoke. He quit smokeless tobacco use about 53 years ago.  His smokeless tobacco use included chew. He reports that he does not drink alcohol  and does not use drugs.  ROS: A complete review of systems was performed.  All systems are negative except for pertinent findings as noted.  Physical Exam:  Vital signs in last 24 hours: Temp:  [97.6 F (36.4 C)-98.3 F (36.8 C)] 98.3 F (36.8 C) (08/19 0810) Pulse Rate:  [87-108] 108 (08/19 0951) Resp:  [15-20] 16 (08/19 0951) BP: (137-160)/(85-112) 138/100 (08/19 0951) SpO2:  [94 %-99 %] 96 % (08/19 0810) Weight:  [91.5 kg] 91.5 kg (08/19 0500) Constitutional:  Alert and oriented, No acute distress Cardiovascular: No JVD GI: Abdomen is soft, nontender, nondistended, no abdominal masses GU: Left PCN draining well with clear urine. Lymphatic: No lymphadenopathy Neurologic: Grossly intact, no focal deficits Psychiatric: Normal mood and affect  Laboratory Data:  Recent Labs    06/29/24 1022 06/30/24 0950 07/01/24 0710  WBC 23.9* 16.5* 11.6*  HGB 10.5* 10.1* 10.4*  HCT 34.0* 32.3* 33.4*  PLT 222 216 212    Recent Labs    06/29/24 1022 06/30/24 0950 07/01/24 0710  NA 133* 139 135  K 4.2 3.9 3.7  CL 109 107 108  GLUCOSE 143* 193* 104*  BUN 31* 39* 36*  CALCIUM  8.1* 8.3* 8.1*  CREATININE 2.10* 2.36* 2.09*     Results  for orders placed or performed during the hospital encounter of 06/27/24 (from the past 24 hours)  Culture, blood (Routine X 2) w Reflex to ID Panel     Status: None (Preliminary result)   Collection Time: 06/30/24  2:14 PM   Specimen: BLOOD  Result Value Ref Range   Specimen Description BLOOD SITE NOT SPECIFIED    Special Requests      BOTTLES DRAWN AEROBIC ONLY Blood Culture adequate volume   Culture      NO GROWTH < 24 HOURS Performed at [facility], [address] [place], [place] [address]    Report Status PENDING   Culture, blood (Routine X 2) w Reflex to ID Panel     Status: None (Preliminary result)   Collection Time: 06/30/24  2:15 PM   Specimen: BLOOD LEFT HAND  Result Value Ref Range   Specimen Description BLOOD LEFT HAND    Special Requests      BOTTLES DRAWN AEROBIC AND ANAEROBIC Blood Culture adequate volume   Culture      NO GROWTH < 24 HOURS Performed at [facility], [address], [place], [place] [address]    Report Status PENDING   Glucose, capillary     Status: Abnormal   Collection Time: 06/30/24  4:09 PM  Result Value Ref Range   Glucose-Capillary 152 (H) 70 - 99 mg/dL  Glucose, capillary     Status: Abnormal   Collection Time: 06/30/24  9:39 PM  Result Value Ref Range   Glucose-Capillary 105 (H) 70 - 99 mg/dL  Glucose, capillary     Status: Abnormal   Collection Time: 07/01/24 12:49 AM  Result Value Ref Range   Glucose-Capillary 119 (H) 70 - 99 mg/dL  Glucose, capillary     Status: Abnormal   Collection Time: 07/01/24  4:46 AM  Result Value Ref Range  Glucose-Capillary 108 (H) 70 - 99 mg/dL  Comprehensive metabolic panel     Status: Abnormal   Collection Time: 07/01/24  7:10 AM  Result Value Ref Range   Sodium 135 135 - 145 mmol/L   Potassium 3.7 3.5 - 5.1 mmol/L   Chloride 108 98 - 111 mmol/L   CO2 19 (L) 22 - 32 mmol/L   Glucose, Bld 104 (H) 70 - 99 mg/dL   BUN 36 (H) 8 - 23 mg/dL   Creatinine, Ser 7.90 (H) 0.61 - 1.24 mg/dL    Calcium  8.1 (L) 8.9 - 10.3 mg/dL   Total Protein 7.0 6.5 - 8.1 g/dL   Albumin  1.8 (L) 3.5 - 5.0 g/dL   AST 41 15 - 41 U/L   ALT 23 0 - 44 U/L   Alkaline Phosphatase 130 (H) 38 - 126 U/L   Total Bilirubin 1.3 (H) 0.0 - 1.2 mg/dL   GFR, Estimated 32 (L) >60 mL/min   Anion gap 8 5 - 15  CBC with Differential/Platelet     Status: Abnormal   Collection Time: 07/01/24  7:10 AM  Result Value Ref Range   WBC 11.6 (H) 4.0 - 10.5 K/uL   RBC 4.51 4.22 - 5.81 MIL/uL   Hemoglobin 10.4 (L) 13.0 - 17.0 g/dL   HCT 66.5 (L) 60.9 - 47.9 %   MCV 74.1 (L) 80.0 - 100.0 fL   MCH 23.1 (L) 26.0 - 34.0 pg   MCHC 31.1 30.0 - 36.0 g/dL   RDW 79.0 (H) 88.4 - 84.4 %   Platelets 212 150 - 400 K/uL   nRBC 0.0 0.0 - 0.2 %   Neutrophils Relative % 94 %   Neutro Abs 10.9 (H) 1.7 - 7.7 K/uL   Lymphocytes Relative 2 %   Lymphs Abs 0.2 (L) 0.7 - 4.0 K/uL   Monocytes Relative 4 %   Monocytes Absolute 0.5 0.1 - 1.0 K/uL   Eosinophils Relative 0 %   Eosinophils Absolute 0.0 0.0 - 0.5 K/uL   Basophils Relative 0 %   Basophils Absolute 0.0 0.0 - 0.1 K/uL   WBC Morphology See Note    RBC Morphology MORPHOLOGY UNREMARKABLE    Smear Review See Note   Magnesium      Status: None   Collection Time: 07/01/24  7:10 AM  Result Value Ref Range   Magnesium  2.0 1.7 - 2.4 mg/dL  Glucose, capillary     Status: Abnormal   Collection Time: 07/01/24  8:06 AM  Result Value Ref Range   Glucose-Capillary 110 (H) 70 - 99 mg/dL   Comment 1 Notify RN    Comment 2 Document in Chart   Glucose, capillary     Status: Abnormal   Collection Time: 07/01/24 11:23 AM  Result Value Ref Range   Glucose-Capillary 133 (H) 70 - 99 mg/dL   Comment 1 Notify RN    Comment 2 Document in Chart    Recent Results (from the past 240 hours)  Urine Culture     Status: Abnormal   Collection Time: 06/27/24 12:00 AM  Result Value Ref Range Status   MICRO NUMBER: [id number]  Final   SPECIMEN QUALITY: Adequate  Final   Sample Source URINE  Final    STATUS: FINAL  Final   ISOLATE 1: Proteus mirabilis (A)  Final    Comment: Greater than 100,000 CFU/mL of Proteus mirabilis      Susceptibility   Proteus mirabilis - URINE CULTURE, REFLEX    AMOX/CLAVULANIC <=2 Sensitive  AMPICILLIN/SULBACTAM <=2 Sensitive     CEFAZOLIN * >=32 Resistant      * For uncomplicated UTI caused by E. coli, K. pneumoniae or P. mirabilis: Cefazolin  is susceptible if MIC <32 mcg/mL and predicts susceptible to the oral agents cefaclor, cefdinir, cefpodoxime, cefprozil, cefuroxime, cephalexin  and loracarbef.     CEFTAZIDIME <=0.5 Sensitive     CEFEPIME  4 Intermediate     CEFTRIAXONE  <=0.25 Sensitive     CIPROFLOXACIN  <=0.06 Sensitive     LEVOFLOXACIN <=0.12 Sensitive     GENTAMICIN <=1 Sensitive     MEROPENEM 1 Sensitive     NITROFURANTOIN  128 Resistant     PIP/TAZO <=4 Sensitive     TRIMETH /SULFA * <=20 Sensitive      * For uncomplicated UTI caused by E. coli, K. pneumoniae or P. mirabilis: Cefazolin  is susceptible if MIC <32 mcg/mL and predicts susceptible to the oral agents cefaclor, cefdinir, cefpodoxime, cefprozil, cefuroxime, cephalexin  and loracarbef. Legend: S = Susceptible  I = Intermediate R = Resistant  NS = Not susceptible SDD = Susceptible Dose Dependent * = Not Tested  NR = Not Reported **NN = See Therapy Comments   Blood Culture (routine x 2)     Status: Abnormal   Collection Time: 06/28/24  1:10 AM   Specimen: BLOOD RIGHT ARM  Result Value Ref Range Status   Specimen Description BLOOD RIGHT ARM  Final   Special Requests   Final    BOTTLES DRAWN AEROBIC AND ANAEROBIC Blood Culture adequate volume   Culture  Setup Time   Final    GRAM POSITIVE COCCI IN CLUSTERS AEROBIC BOTTLE ONLY CRITICAL VALUE NOTED.  VALUE IS CONSISTENT WITH PREVIOUSLY REPORTED AND CALLED VALUE.    Culture (A)  Final    STAPHYLOCOCCUS AUREUS SUSCEPTIBILITIES PERFORMED ON PREVIOUS CULTURE WITHIN THE LAST 5 DAYS. Performed at [facility], [address] [place] [address], [place], [place] [address]    Report Status 06/30/2024 FINAL  Final  Urine Culture     Status: Abnormal   Collection Time: 06/28/24  1:11 AM   Specimen: Urine, Random  Result Value Ref Range Status   Specimen Description URINE, RANDOM  Final   Special Requests   Final    NONE Reflexed from [id number] Performed at [facility], [address], [place], [place] [address]    Culture (A)  Final    >=100,000 COLONIES/mL METHICILLIN RESISTANT STAPHYLOCOCCUS AUREUS >=100,000 COLONIES/mL ENTEROCOCCUS FAECALIS    Report Status 06/30/2024 FINAL  Final   Organism ID, Bacteria METHICILLIN RESISTANT STAPHYLOCOCCUS AUREUS (A)  Final   Organism ID, Bacteria ENTEROCOCCUS FAECALIS (A)  Final      Susceptibility   Enterococcus faecalis - MIC*    AMPICILLIN <=2 SENSITIVE Sensitive     NITROFURANTOIN  <=16 SENSITIVE Sensitive     VANCOMYCIN  1 SENSITIVE Sensitive     * >=100,000 COLONIES/mL ENTEROCOCCUS FAECALIS   Methicillin resistant staphylococcus aureus - MIC*    CIPROFLOXACIN  >=8 RESISTANT Resistant     GENTAMICIN <=0.5 SENSITIVE Sensitive     NITROFURANTOIN  <=16 SENSITIVE Sensitive     OXACILLIN >=4 RESISTANT Resistant     TETRACYCLINE <=1 SENSITIVE Sensitive     VANCOMYCIN  1 SENSITIVE Sensitive     TRIMETH /SULFA  >=320 RESISTANT Resistant     RIFAMPIN <=0.5 SENSITIVE Sensitive     Inducible Clindamycin NEGATIVE Sensitive     LINEZOLID 2 SENSITIVE Sensitive     * >=100,000 COLONIES/mL METHICILLIN RESISTANT STAPHYLOCOCCUS AUREUS  Blood Culture (routine x 2)  Status: Abnormal (Preliminary result)   Collection Time: 06/28/24  2:25 AM   Specimen: BLOOD LEFT ARM  Result Value Ref Range Status   Specimen Description BLOOD LEFT ARM  Final   Special Requests   Final    BOTTLES DRAWN AEROBIC AND ANAEROBIC Blood Culture adequate volume   Culture  Setup Time   Final    GRAM POSITIVE COCCI IN BOTH AEROBIC AND ANAEROBIC BOTTLES CRITICAL RESULT CALLED TO, READ BACK BY AND VERIFIED WITH:  PHARMD MICHAEL BITONTI [id number] 1911 BY J RAZZAK,MT    Culture (A)  Final    METHICILLIN RESISTANT STAPHYLOCOCCUS AUREUS ENTEROCOCCUS FAECALIS SUSCEPTIBILITIES TO FOLLOW CRITICAL RESULT CALLED TO, READ BACK BY AND VERIFIED WITH: PHARMD E.MARTIN AT 1026 ON 07/02/2024 BY T.SAAD. Sent to [facility] for further susceptibility testing. Performed at [facility], [address], [place], [place] [address]    Report Status PENDING  Incomplete   Organism ID, Bacteria METHICILLIN RESISTANT STAPHYLOCOCCUS AUREUS  Final      Susceptibility   Methicillin resistant staphylococcus aureus - MIC*    CIPROFLOXACIN  >=8 RESISTANT Resistant     ERYTHROMYCIN <=0.25 SENSITIVE Sensitive     GENTAMICIN <=0.5 SENSITIVE Sensitive     OXACILLIN >=4 RESISTANT Resistant     TETRACYCLINE <=1 SENSITIVE Sensitive     VANCOMYCIN  1 SENSITIVE Sensitive     TRIMETH /SULFA  >=320 RESISTANT Resistant     CLINDAMYCIN <=0.25 SENSITIVE Sensitive     RIFAMPIN <=0.5 SENSITIVE Sensitive     Inducible Clindamycin NEGATIVE Sensitive     LINEZOLID 2 SENSITIVE Sensitive     * METHICILLIN RESISTANT STAPHYLOCOCCUS AUREUS  Blood Culture ID Panel (Reflexed)     Status: Abnormal   Collection Time: 06/28/24  2:25 AM  Result Value Ref Range Status   Enterococcus faecalis NOT DETECTED NOT DETECTED Final   Enterococcus Faecium NOT DETECTED NOT DETECTED Final   Listeria monocytogenes NOT DETECTED NOT DETECTED Final   Staphylococcus species DETECTED (A) NOT DETECTED Final    Comment: CRITICAL RESULT CALLED TO, READ BACK BY AND VERIFIED WITH: PHARMD MICHAEL BITONTI [id number] 1911 BY J RAZZAK, MT    Staphylococcus aureus (BCID) DETECTED (A) NOT DETECTED Final    Comment: Methicillin (oxacillin)-resistant Staphylococcus aureus (MRSA). MRSA is predictably resistant to beta-lactam antibiotics (except ceftaroline). Preferred therapy is vancomycin  unless clinically contraindicated. Patient requires contact precautions if   hospitalized. CRITICAL RESULT CALLED TO, READ BACK BY AND VERIFIED WITH: PHARMD MICHAEL BITONTI [id number] 1911 BY J RAZZAK, MT    Staphylococcus epidermidis NOT DETECTED NOT DETECTED Final   Staphylococcus lugdunensis NOT DETECTED NOT DETECTED Final   Streptococcus species NOT DETECTED NOT DETECTED Final   Streptococcus agalactiae NOT DETECTED NOT DETECTED Final   Streptococcus pneumoniae NOT DETECTED NOT DETECTED Final   Streptococcus pyogenes NOT DETECTED NOT DETECTED Final   A.calcoaceticus-baumannii NOT DETECTED NOT DETECTED Final   Bacteroides fragilis NOT DETECTED NOT DETECTED Final   Enterobacterales NOT DETECTED NOT DETECTED Final   Enterobacter cloacae complex NOT DETECTED NOT DETECTED Final   Escherichia coli NOT DETECTED NOT DETECTED Final   Klebsiella aerogenes NOT DETECTED NOT DETECTED Final   Klebsiella oxytoca NOT DETECTED NOT DETECTED Final   Klebsiella pneumoniae NOT DETECTED NOT DETECTED Final   Proteus species NOT DETECTED NOT DETECTED Final   Salmonella species NOT DETECTED NOT DETECTED Final   Serratia marcescens NOT DETECTED NOT DETECTED Final   Haemophilus influenzae NOT DETECTED NOT DETECTED Final   Neisseria meningitidis NOT DETECTED NOT DETECTED Final   Pseudomonas  aeruginosa NOT DETECTED NOT DETECTED Final   Stenotrophomonas maltophilia NOT DETECTED NOT DETECTED Final   Candida albicans NOT DETECTED NOT DETECTED Final   Candida auris NOT DETECTED NOT DETECTED Final   Candida glabrata NOT DETECTED NOT DETECTED Final   Candida krusei NOT DETECTED NOT DETECTED Final   Candida parapsilosis NOT DETECTED NOT DETECTED Final   Candida tropicalis NOT DETECTED NOT DETECTED Final   Cryptococcus neoformans/gattii NOT DETECTED NOT DETECTED Final   Meth resistant mecA/C and MREJ DETECTED (A) NOT DETECTED Final    Comment: CRITICAL RESULT CALLED TO, READ BACK BY AND VERIFIED WITH: MAYA OZELL JAMA[place] [id number] 1911 BY JINNY COMMON, MT Performed at [facility]  Lab, [address] [place], [place] [address]   Culture, blood (Routine X 2) w Reflex to ID Panel     Status: None (Preliminary result)   Collection Time: 06/30/24  2:14 PM   Specimen: BLOOD  Result Value Ref Range Status   Specimen Description BLOOD SITE NOT SPECIFIED  Final   Special Requests   Final    BOTTLES DRAWN AEROBIC ONLY Blood Culture adequate volume   Culture   Final    NO GROWTH < 24 HOURS Performed at [facility], [address], [place], [place] [address]    Report Status PENDING  Incomplete  Culture, blood (Routine X 2) w Reflex to ID Panel     Status: None (Preliminary result)   Collection Time: 06/30/24  2:15 PM   Specimen: BLOOD LEFT HAND  Result Value Ref Range Status   Specimen Description BLOOD LEFT HAND  Final   Special Requests   Final    BOTTLES DRAWN AEROBIC AND ANAEROBIC Blood Culture adequate volume   Culture   Final    NO GROWTH < 24 HOURS Performed at [facility], [address] [place], [place] [address]    Report Status PENDING  Incomplete    Renal Function: Recent Labs    06/25/24 1023 06/27/24 0000 06/28/24 0111 06/29/24 1022 06/30/24 0950 07/01/24 0710  CREATININE 1.94* 1.68* 1.96* 2.10* 2.36* 2.09*   Estimated Creatinine Clearance: 34.2 mL/min (A) (by C-G formula based on SCr of 2.09 mg/dL (H)).  Radiologic Imaging:   I independently reviewed the above imaging studies.  Impression/Recommendation Left ureteral obstruction:  Continue PCN drainage.  I don't think there would be any benefit to changing out the nephrostomy since this was just placed.  He appears to be responding to antibiotic therapy.  Will need long term PCN drainage of the left kidney.  Noretta Ferrara 07/01/2024, 12:59 PM    Gretel CANDIE Ferrara Teddie MD   CC: Dr. Royal

## 2024-07-01 NOTE — H&P (View-Only) (Signed)
 [facility]    Date of Admission:  06/27/2024     ID: Adam Harris is a 78 y.o. 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 Still having some flank pain. No fever or chills  Micro  - blood cx now showing enterococcus as well as MRSA  Medications:   artificial tears  2 drop Both Eyes QID   aspirin  EC  81 mg Oral Daily   enoxaparin  (LOVENOX ) injection  40 mg Subcutaneous Q24H   feeding supplement  237 mL Oral BID BM   Gerhardt's butt cream   Topical BID   insulin  aspart  0-9 Units Subcutaneous Q4H   isosorbide  mononitrate  15 mg Oral Daily   linaclotide   145 mcg Oral QAC breakfast   metoprolol  tartrate  25 mg Oral TID   mirtazapine   15 mg Oral QHS   sodium bicarbonate   650 mg Oral BID   sodium chloride  flush  3 mL Intravenous Q12H   tamsulosin   0.4 mg Oral Daily    Objective: Vital signs in last 24 hours: Temp:  [97.6 F (36.4 C)-98.3 F (36.8 C)] 98.3 F (36.8 C) (08/19 0810) Pulse Rate:  [87-109] 108 (08/19 0951) Resp:  [15-20] 16 (08/19 0951) BP: (137-160)/(85-112) 138/100 (08/19 0951) SpO2:  [94 %-99 %] 96 % (08/19 0810) Weight:  [91.5 kg] 91.5 kg (08/19 0500)  Physical Exam  Constitutional: He is oriented to person, place, and time. He appears well-developed and well-nourished. No distress.  HENT:  Mouth/Throat: Oropharynx is clear and moist. No oropharyngeal exudate.  Cardiovascular: Normal rate, regular rhythm and normal heart sounds. Exam reveals  no gallop and no friction rub.  No murmur heard.  Pulmonary/Chest: Effort normal and breath sounds normal. No respiratory distress. He has no wheezes.  Abdominal: Soft. Bowel sounds are normal. He exhibits no distension. There is no tenderness.  Lymphadenopathy:  He has no cervical adenopathy.  Neurological: He is alert and oriented to person, place, and time.  Skin: Skin is warm and dry. No rash noted. No erythema.  Psychiatric: He has a normal mood and affect. His behavior is normal.    Lab Results Recent Labs    06/30/24 0950 07/01/24 0710  WBC 16.5* 11.6*  HGB 10.1* 10.4*  HCT 32.3* 33.4*  NA 139 135  K 3.9 3.7  CL 107 108  CO2 19* 19*  BUN 39* 36*  CREATININE 2.36* 2.09*   Liver Panel Recent Labs    06/30/24 0950 07/01/24 0710  PROT 6.5 7.0  ALBUMIN  1.6* 1.8*  AST 32 41  ALT 16 23  ALKPHOS 109 130*  BILITOT 1.4* 1.3*   Sedimentation Rate No results for input(s): ESRSEDRATE in the last 72 hours. C-Reactive Protein No results for input(s): CRP in the last 72 hours.  Microbiology: 8/18 blood cx NGTD 8/16 blood cx MRSA and enterococcus  8/16 uiren cx MRSA and enterococcus Studies/Results: ECHOCARDIOGRAM COMPLETE BUBBLE STUDY Result Date: 06/30/2024    ECHOCARDIOGRAM REPORT  Patient Name:   Adam Harris  Date of Exam: 06/30/2024 Medical Rec #:  [id number]  Height:       71.0 in Accession #:    [id number] Weight:       198.4 lb Date of Birth:  May 13, 1946   BSA:          2.101 m Patient Age:    78 years   BP:           137/95 mmHg Patient Gender: M          HR:           112 bpm. Exam Location:  Inpatient Procedure: 2D Echo, Cardiac Doppler and Color Doppler (Both Spectral and Color            Flow Doppler were utilized during procedure). Indications:    Bacteremia [790.7.ICD-9-CM]  History:        Patient has prior history of Echocardiogram examinations, most                 recent 06/12/2022. Risk Factors:Hypertension.  Sonographer:    Benard Stallion Referring Phys:  [id number] GREGORY D CALONE IMPRESSIONS  1. Left ventricular ejection fraction, by estimation, is 60 to 65%. The left ventricle has normal function. The left ventricle has no regional wall motion abnormalities. There is moderate asymmetric left ventricular hypertrophy of the basal-septal segment. Left ventricular diastolic parameters are indeterminate.  2. Right ventricular systolic function is normal. The right ventricular size is normal. There is severely elevated pulmonary artery systolic pressure. The estimated right ventricular systolic pressure is 77.4 mmHg.  3. Left atrial size was moderately dilated.  4. Right atrial size was mildly dilated.  5. Negative bubble study, no evidence for PFO or ASD.  6. The mitral valve is normal in structure. Trivial mitral valve regurgitation. No evidence of mitral stenosis.  7. Tricuspid valve regurgitation is moderate.  8. The aortic valve is tricuspid. There is mild calcification of the aortic valve. Aortic valve regurgitation is not visualized. No aortic stenosis is present.  9. Aortic dilatation noted. There is borderline dilatation of the ascending aorta, measuring 38 mm. 10. The inferior vena cava is dilated in size with <50% respiratory variability, suggesting right atrial pressure of 15 mmHg. 11. The patient was in atrial fibrillation. 12. No valvular vegetation was visualized. FINDINGS  Left Ventricle: Left ventricular ejection fraction, by estimation, is 60 to 65%. The left ventricle has normal function. The left ventricle has no regional wall motion abnormalities. The left ventricular internal cavity size was normal in size. There is  moderate asymmetric left ventricular hypertrophy of the basal-septal segment. Left ventricular diastolic parameters are indeterminate. Right Ventricle: The right ventricular size is normal. No increase in right ventricular wall thickness. Right ventricular systolic function is normal. There is severely elevated pulmonary artery systolic  pressure. The tricuspid regurgitant velocity is 3.95 m/s, and with an assumed right atrial pressure of 15 mmHg, the estimated right ventricular systolic pressure is 77.4 mmHg. Left Atrium: Left atrial size was moderately dilated. Right Atrium: Right atrial size was mildly dilated. Pericardium: There is no evidence of pericardial effusion. Mitral Valve: The mitral valve is normal in structure. Trivial mitral valve regurgitation. No evidence of mitral valve stenosis. Tricuspid Valve: The tricuspid valve is normal in structure. Tricuspid valve regurgitation is moderate. Aortic Valve: The aortic valve is tricuspid. There is mild calcification of the aortic valve. Aortic valve regurgitation is not visualized. No aortic stenosis is present. Aortic valve mean gradient  measures 2.0 mmHg. Aortic valve peak gradient measures 3.6 mmHg. Aortic valve area, by VTI measures 3.63 cm. Pulmonic Valve: The pulmonic valve was normal in structure. Pulmonic valve regurgitation is trivial. Aorta: The aortic root is normal in size and structure and aortic dilatation noted. There is borderline dilatation of the ascending aorta, measuring 38 mm. Venous: The inferior vena cava is dilated in size with less than 50% respiratory variability, suggesting right atrial pressure of 15 mmHg. IAS/Shunts: Negative bubble study, no evidence for PFO or ASD.  LEFT VENTRICLE PLAX 2D LVIDd:         2.50 cm   Diastology LVIDs:         2.10 cm   LV e' medial:    6.85 cm/s LV PW:         1.20 cm   LV E/e' medial:  11.2 LV IVS:        1.20 cm   LV e' lateral:   8.81 cm/s LVOT diam:     2.20 cm   LV E/e' lateral: 8.7 LV SV:         57 LV SV Index:   27 LVOT Area:     3.80 cm  RIGHT VENTRICLE RV Basal diam:  3.50 cm RV Mid diam:    2.70 cm RV S prime:     9.61 cm/s TAPSE (M-mode): 1.6 cm LEFT ATRIUM             Index        RIGHT ATRIUM           Index LA diam:        3.90 cm 1.86 cm/m   RA Area:     22.00 cm LA Vol (A2C):   95.8 ml 45.59 ml/m  RA Volume:    58.60 ml  27.89 ml/m LA Vol (A4C):   81.3 ml 38.69 ml/m LA Biplane Vol: 91.9 ml 43.73 ml/m  AORTIC VALVE AV Area (Vmax):    3.45 cm AV Area (Vmean):   3.11 cm AV Area (VTI):     3.63 cm AV Vmax:           95.30 cm/s AV Vmean:          68.600 cm/s AV VTI:            0.156 m AV Peak Grad:      3.6 mmHg AV Mean Grad:      2.0 mmHg LVOT Vmax:         86.60 cm/s LVOT Vmean:        56.200 cm/s LVOT VTI:          0.149 m LVOT/AV VTI ratio: 0.96  AORTA Ao Root diam: 3.10 cm Ao Asc diam:  3.80 cm MITRAL VALVE               TRICUSPID VALVE MV Area (PHT): 6.96 cm    TR Peak grad:   62.4 mmHg MV Decel Time: 109 msec    TR Vmax:        395.00 cm/s MV E velocity: 76.40 cm/s MV A velocity: 27.10 cm/s  SHUNTS MV E/A ratio:  2.82        Systemic VTI:  0.15 m                            Systemic Diam: 2.20 cm Dalton McleanMD Electronically signed by Ezra Kanner Signature Date/Time: 06/30/2024/9:06:21 PM    Final  CT CHEST WO CONTRAST Result Date: 06/29/2024 CLINICAL DATA:  Pneumonia suspected, uncomplicated, no prior imaging (Ped >= 43mo). History of urinary bladder cancer. EXAM: CT CHEST WITHOUT CONTRAST TECHNIQUE: Multidetector CT imaging of the chest was performed following the standard protocol without IV contrast. RADIATION DOSE REDUCTION: This exam was performed according to the departmental dose-optimization program which includes automated exposure control, adjustment of the mA and/or kV according to patient size and/or use of iterative reconstruction technique. COMPARISON:  PET CT 06/13/2024, CT chest 07/30/2024, chest x-ray 06/29/2024 FINDINGS: Cardiovascular: Normal heart size. No significant pericardial effusion. The thoracic aorta is normal in caliber. Severe atherosclerotic plaque of the thoracic aorta. Four-vessel coronary artery calcifications. Amplatzer device noted along the left atria. Mediastinum/Nodes: No gross hilar adenopathy, noting limited sensitivity for the detection of hilar adenopathy on this  noncontrast study. Persistent multiple prominent but nonenlarged mediastinal lymph nodes. No enlarged mediastinal or axillary lymph nodes. Thyroid  gland, trachea, and esophagus demonstrate no significant findings. Lungs/Pleura: Interval development of left upper lobe patchy ground-glass airspace opacities more prominent along the dependent level. Similar findings along the left lower lobe. Redemonstration of chronic several scattered calcified micronodules along the bilateral lungs. Mild interlobular septal wall thickening. Along the apices. No pulmonary mass. Trace bilateral, left greater than right, pleural effusions. No pneumothorax. Upper Abdomen: Fluid dense lesion along the left superior renal pole suggestive of a simple renal cyst. Simple renal cysts, in the absence of clinically indicated signs/symptoms, require no independent follow-up. Atherosclerotic plaque. Musculoskeletal: No abdominal wall hernia or abnormality.  Bilateral gynecomastia. No suspicious lytic or blastic osseous lesions. No acute displaced fracture. Multilevel degenerative changes of the spine. IMPRESSION: 1. Mild pulmonary edema with left upper lobe patchy ground-glass airspace opacities more prominent along the dependent level. Similar findings along the left lower lobe. Finding could represent superimposed developing infection/inflammation. 2. Trace bilateral, left greater than right, pleural effusions. 3. Redemonstration of colonic several scattered calcified micronodules along the bilateral lungs. Electronically Signed   By: Morgane  Naveau M.D.   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

## 2024-07-01 NOTE — Progress Notes (Signed)
 Pharmacy Antimicrobial Stewardship Note  77 YOM with MRSA bacteremia in both sets thought to have seeded from nephrostomy tubes and now with micro update stating enterococcus faecalis also growing in 1 set. Of noting, E faecalis and MRSA also in UCx isolate.   Specimen Description BLOOD LEFT ARM  Special Requests BOTTLES DRAWN AEROBIC AND ANAEROBIC Blood Culture adequate volume  Culture  Setup Time GRAM POSITIVE COCCI IN BOTH AEROBIC AND ANAEROBIC BOTTLES CRITICAL RESULT CALLED TO, READ BACK BY AND VERIFIED WITH: PHARMD MICHAEL BITONTI [id number] BY J RAZZAK,MT  Culture  Abnormal  METHICILLIN RESISTANT STAPHYLOCOCCUS AUREUS ENTEROCOCCUS FAECALIS SUSCEPTIBILITIES TO FOLLOW CRITICAL RESULT CALLED TO, READ BACK BY AND VERIFIED WITH: PHARMD E.Chanah Tidmore AT 1026 ON 07/02/2024 BY T.SAAD. Performed at [facility], [address], [place], [place] [address]   Report Status PENDING  Organism ID, Bacteria METHICILLIN RESISTANT STAPHYLOCOCCUS AUREUS  Resulting Agency [facility]     Susceptibility   Methicillin resistant staphylococcus aureus    MIC    CIPROFLOXACIN  >=8 RESISTANT Resistant    CLINDAMYCIN <=0.25 SENS... Sensitive    ERYTHROMYCIN <=0.25 SENS... Sensitive    GENTAMICIN <=0.5 SENSI... Sensitive    Inducible Clindamycin NEGATIVE Sensitive    LINEZOLID 2 SENSITIVE Sensitive    OXACILLIN >=4 RESISTANT Resistant    RIFAMPIN <=0.5 SENSI... Sensitive    TETRACYCLINE <=1 SENSITIVE Sensitive    TRIMETH /SULFA  >=320 RESIS... Resistant    VANCOMYCIN  1 SENSITIVE Sensitive              Given enterococcus faecalis update in blood cultures - will adjust Daptomycin  dosing to target 10 mg/kg  Plan - Adjust Daptomycin  to 900 mg IV every 24 hours - Will monitor renal function for dose adjustments - Continue with weekly CK checks  Thank you for allowing pharmacy to be a part of this patient's care.  Almarie Lunger, PharmD, BCPS, BCIDP Infectious Diseases Clinical  Pharmacist 07/01/2024 10:32 AM   **Pharmacist phone directory can now be found on [email or web address] ([email or web address]).  Listed under [facility].

## 2024-07-01 NOTE — Plan of Care (Signed)

## 2024-07-01 NOTE — Progress Notes (Signed)
 [facility]    Date of Admission:  06/27/2024     ID: Adam Harris is a 78 y.o. 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 Still having some flank pain. No fever or chills  Micro  - blood cx now showing enterococcus as well as MRSA  Medications:   artificial tears  2 drop Both Eyes QID   aspirin  EC  81 mg Oral Daily   enoxaparin  (LOVENOX ) injection  40 mg Subcutaneous Q24H   feeding supplement  237 mL Oral BID BM   Gerhardt's butt cream   Topical BID   insulin  aspart  0-9 Units Subcutaneous Q4H   isosorbide  mononitrate  15 mg Oral Daily   linaclotide   145 mcg Oral QAC breakfast   metoprolol  tartrate  25 mg Oral TID   mirtazapine   15 mg Oral QHS   sodium bicarbonate   650 mg Oral BID   sodium chloride  flush  3 mL Intravenous Q12H   tamsulosin   0.4 mg Oral Daily    Objective: Vital signs in last 24 hours: Temp:  [97.6 F (36.4 C)-98.3 F (36.8 C)] 98.3 F (36.8 C) (08/19 0810) Pulse Rate:  [87-109] 108 (08/19 0951) Resp:  [15-20] 16 (08/19 0951) BP: (137-160)/(85-112) 138/100 (08/19 0951) SpO2:  [94 %-99 %] 96 % (08/19 0810) Weight:  [91.5 kg] 91.5 kg (08/19 0500)  Physical Exam  Constitutional: He is oriented to person, place, and time. He appears well-developed and well-nourished. No distress.  HENT:  Mouth/Throat: Oropharynx is clear and moist. No oropharyngeal exudate.  Cardiovascular: Normal rate, regular rhythm and normal heart sounds. Exam reveals  no gallop and no friction rub.  No murmur heard.  Pulmonary/Chest: Effort normal and breath sounds normal. No respiratory distress. He has no wheezes.  Abdominal: Soft. Bowel sounds are normal. He exhibits no distension. There is no tenderness.  Lymphadenopathy:  He has no cervical adenopathy.  Neurological: He is alert and oriented to person, place, and time.  Skin: Skin is warm and dry. No rash noted. No erythema.  Psychiatric: He has a normal mood and affect. His behavior is normal.    Lab Results Recent Labs    06/30/24 0950 07/01/24 0710  WBC 16.5* 11.6*  HGB 10.1* 10.4*  HCT 32.3* 33.4*  NA 139 135  K 3.9 3.7  CL 107 108  CO2 19* 19*  BUN 39* 36*  CREATININE 2.36* 2.09*   Liver Panel Recent Labs    06/30/24 0950 07/01/24 0710  PROT 6.5 7.0  ALBUMIN  1.6* 1.8*  AST 32 41  ALT 16 23  ALKPHOS 109 130*  BILITOT 1.4* 1.3*   Sedimentation Rate No results for input(s): ESRSEDRATE in the last 72 hours. C-Reactive Protein No results for input(s): CRP in the last 72 hours.  Microbiology: 8/18 blood cx NGTD 8/16 blood cx MRSA and enterococcus  8/16 uiren cx MRSA and enterococcus Studies/Results: ECHOCARDIOGRAM COMPLETE BUBBLE STUDY Result Date: 06/30/2024    ECHOCARDIOGRAM REPORT  Patient Name:   Adam Harris  Date of Exam: 06/30/2024 Medical Rec #:  [id number]  Height:       71.0 in Accession #:    [id number] Weight:       198.4 lb Date of Birth:  May 13, 1946   BSA:          2.101 m Patient Age:    78 years   BP:           137/95 mmHg Patient Gender: M          HR:           112 bpm. Exam Location:  Inpatient Procedure: 2D Echo, Cardiac Doppler and Color Doppler (Both Spectral and Color            Flow Doppler were utilized during procedure). Indications:    Bacteremia [790.7.ICD-9-CM]  History:        Patient has prior history of Echocardiogram examinations, most                 recent 06/12/2022. Risk Factors:Hypertension.  Sonographer:    Benard Stallion Referring Phys:  [id number] GREGORY D CALONE IMPRESSIONS  1. Left ventricular ejection fraction, by estimation, is 60 to 65%. The left ventricle has normal function. The left ventricle has no regional wall motion abnormalities. There is moderate asymmetric left ventricular hypertrophy of the basal-septal segment. Left ventricular diastolic parameters are indeterminate.  2. Right ventricular systolic function is normal. The right ventricular size is normal. There is severely elevated pulmonary artery systolic pressure. The estimated right ventricular systolic pressure is 77.4 mmHg.  3. Left atrial size was moderately dilated.  4. Right atrial size was mildly dilated.  5. Negative bubble study, no evidence for PFO or ASD.  6. The mitral valve is normal in structure. Trivial mitral valve regurgitation. No evidence of mitral stenosis.  7. Tricuspid valve regurgitation is moderate.  8. The aortic valve is tricuspid. There is mild calcification of the aortic valve. Aortic valve regurgitation is not visualized. No aortic stenosis is present.  9. Aortic dilatation noted. There is borderline dilatation of the ascending aorta, measuring 38 mm. 10. The inferior vena cava is dilated in size with <50% respiratory variability, suggesting right atrial pressure of 15 mmHg. 11. The patient was in atrial fibrillation. 12. No valvular vegetation was visualized. FINDINGS  Left Ventricle: Left ventricular ejection fraction, by estimation, is 60 to 65%. The left ventricle has normal function. The left ventricle has no regional wall motion abnormalities. The left ventricular internal cavity size was normal in size. There is  moderate asymmetric left ventricular hypertrophy of the basal-septal segment. Left ventricular diastolic parameters are indeterminate. Right Ventricle: The right ventricular size is normal. No increase in right ventricular wall thickness. Right ventricular systolic function is normal. There is severely elevated pulmonary artery systolic  pressure. The tricuspid regurgitant velocity is 3.95 m/s, and with an assumed right atrial pressure of 15 mmHg, the estimated right ventricular systolic pressure is 77.4 mmHg. Left Atrium: Left atrial size was moderately dilated. Right Atrium: Right atrial size was mildly dilated. Pericardium: There is no evidence of pericardial effusion. Mitral Valve: The mitral valve is normal in structure. Trivial mitral valve regurgitation. No evidence of mitral valve stenosis. Tricuspid Valve: The tricuspid valve is normal in structure. Tricuspid valve regurgitation is moderate. Aortic Valve: The aortic valve is tricuspid. There is mild calcification of the aortic valve. Aortic valve regurgitation is not visualized. No aortic stenosis is present. Aortic valve mean gradient  measures 2.0 mmHg. Aortic valve peak gradient measures 3.6 mmHg. Aortic valve area, by VTI measures 3.63 cm. Pulmonic Valve: The pulmonic valve was normal in structure. Pulmonic valve regurgitation is trivial. Aorta: The aortic root is normal in size and structure and aortic dilatation noted. There is borderline dilatation of the ascending aorta, measuring 38 mm. Venous: The inferior vena cava is dilated in size with less than 50% respiratory variability, suggesting right atrial pressure of 15 mmHg. IAS/Shunts: Negative bubble study, no evidence for PFO or ASD.  LEFT VENTRICLE PLAX 2D LVIDd:         2.50 cm   Diastology LVIDs:         2.10 cm   LV e' medial:    6.85 cm/s LV PW:         1.20 cm   LV E/e' medial:  11.2 LV IVS:        1.20 cm   LV e' lateral:   8.81 cm/s LVOT diam:     2.20 cm   LV E/e' lateral: 8.7 LV SV:         57 LV SV Index:   27 LVOT Area:     3.80 cm  RIGHT VENTRICLE RV Basal diam:  3.50 cm RV Mid diam:    2.70 cm RV S prime:     9.61 cm/s TAPSE (M-mode): 1.6 cm LEFT ATRIUM             Index        RIGHT ATRIUM           Index LA diam:        3.90 cm 1.86 cm/m   RA Area:     22.00 cm LA Vol (A2C):   95.8 ml 45.59 ml/m  RA Volume:    58.60 ml  27.89 ml/m LA Vol (A4C):   81.3 ml 38.69 ml/m LA Biplane Vol: 91.9 ml 43.73 ml/m  AORTIC VALVE AV Area (Vmax):    3.45 cm AV Area (Vmean):   3.11 cm AV Area (VTI):     3.63 cm AV Vmax:           95.30 cm/s AV Vmean:          68.600 cm/s AV VTI:            0.156 m AV Peak Grad:      3.6 mmHg AV Mean Grad:      2.0 mmHg LVOT Vmax:         86.60 cm/s LVOT Vmean:        56.200 cm/s LVOT VTI:          0.149 m LVOT/AV VTI ratio: 0.96  AORTA Ao Root diam: 3.10 cm Ao Asc diam:  3.80 cm MITRAL VALVE               TRICUSPID VALVE MV Area (PHT): 6.96 cm    TR Peak grad:   62.4 mmHg MV Decel Time: 109 msec    TR Vmax:        395.00 cm/s MV E velocity: 76.40 cm/s MV A velocity: 27.10 cm/s  SHUNTS MV E/A ratio:  2.82        Systemic VTI:  0.15 m                            Systemic Diam: 2.20 cm Dalton McleanMD Electronically signed by Ezra Kanner Signature Date/Time: 06/30/2024/9:06:21 PM    Final  CT CHEST WO CONTRAST Result Date: 06/29/2024 CLINICAL DATA:  Pneumonia suspected, uncomplicated, no prior imaging (Ped >= 43mo). History of urinary bladder cancer. EXAM: CT CHEST WITHOUT CONTRAST TECHNIQUE: Multidetector CT imaging of the chest was performed following the standard protocol without IV contrast. RADIATION DOSE REDUCTION: This exam was performed according to the departmental dose-optimization program which includes automated exposure control, adjustment of the mA and/or kV according to patient size and/or use of iterative reconstruction technique. COMPARISON:  PET CT 06/13/2024, CT chest 07/30/2024, chest x-ray 06/29/2024 FINDINGS: Cardiovascular: Normal heart size. No significant pericardial effusion. The thoracic aorta is normal in caliber. Severe atherosclerotic plaque of the thoracic aorta. Four-vessel coronary artery calcifications. Amplatzer device noted along the left atria. Mediastinum/Nodes: No gross hilar adenopathy, noting limited sensitivity for the detection of hilar adenopathy on this  noncontrast study. Persistent multiple prominent but nonenlarged mediastinal lymph nodes. No enlarged mediastinal or axillary lymph nodes. Thyroid  gland, trachea, and esophagus demonstrate no significant findings. Lungs/Pleura: Interval development of left upper lobe patchy ground-glass airspace opacities more prominent along the dependent level. Similar findings along the left lower lobe. Redemonstration of chronic several scattered calcified micronodules along the bilateral lungs. Mild interlobular septal wall thickening. Along the apices. No pulmonary mass. Trace bilateral, left greater than right, pleural effusions. No pneumothorax. Upper Abdomen: Fluid dense lesion along the left superior renal pole suggestive of a simple renal cyst. Simple renal cysts, in the absence of clinically indicated signs/symptoms, require no independent follow-up. Atherosclerotic plaque. Musculoskeletal: No abdominal wall hernia or abnormality.  Bilateral gynecomastia. No suspicious lytic or blastic osseous lesions. No acute displaced fracture. Multilevel degenerative changes of the spine. IMPRESSION: 1. Mild pulmonary edema with left upper lobe patchy ground-glass airspace opacities more prominent along the dependent level. Similar findings along the left lower lobe. Finding could represent superimposed developing infection/inflammation. 2. Trace bilateral, left greater than right, pleural effusions. 3. Redemonstration of colonic several scattered calcified micronodules along the bilateral lungs. Electronically Signed   By: Morgane  Naveau M.D.   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

## 2024-07-01 NOTE — Consult Note (Signed)
 WOC Nurse Consult Note: Reason for Consult: L buttocks wound  Wound type: Stage 3 Pressure Injuries L buttock (2 separate areas)  Pressure Injury POA: Yes Measurement: see nursing flowsheet  Wound bed: largely pink clean Drainage (amount, consistency, odor) see nursing flowsheet  Periwound: moisture associated skin damage to buttocks  Dressing procedure/placement/frequency: Cleanse L buttock wounds with NS, apply silver hydrofiber ([employer] Lawson #[id number]) cut to fit wound beds daily and secure with silicone foam or ABD pad and tape whichever is preferred.   Will write for Gerhardt's Butt Cream to intact skin of buttocks 2 times daily and prn soiling.   POC discussed with bedside nurse. WOC team will not follow. Re-consult if further needs arise.    Thank you,    Powell Bar MSN, RN-BC, [employer]

## 2024-07-01 NOTE — H&P (View-Only) (Deleted)
 Urology Consult   Physician requesting consult: Dr. Royal  Reason for consult: Left ureteral obstruction  History of Present Illness: Adam Harris is a 77 y.o. 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  He has a solitary left kidney with left PCN.  He had this interrogated about 4 days ago with evidence of complete obstruction of the left ureter distally.  New PCN placed at that time.  He subsequently became lethargic and less responsive at home according to the patient causing his wife to call 911.  Blood culture from 8/16 with MRSA bacteremia.    Past Medical History:  Diagnosis Date   Abnormal radiologic findings on diagnostic imaging of renal pelvis, ureter, or bladder    bilateral ureter abnormalities   Anticoagulant long-term use    eliquis    Anxiety    pt denies   Arthritis    Atrial fibrillation, chronic (HCC)    CAD (coronary artery disease) cardiologist-- dr hochrein   NSTEMI 02-04-2014  per cardiac cath chronic occluded RCA w/ faint left-to-right collaterals and aneurysmal LCFx with sluggish coronary flow/   NSTEMI --11-21-2016 per cardiac cath occluded proximal RCA & mid to diastal CFX 100%, med rx. If that does not work, PTCA or CABG   Cancer [facility])    bladder and kidney   CHF (congestive heart failure) (HCC)    CKD (chronic kidney disease), stage III (HCC)    patient unaware   DOE (dyspnea on exertion)    Fatty liver    pt denies   Hematuria 02/2019   History of COVID-19 10/2019   History of non-ST elevation myocardial infarction (NSTEMI)    02-04-2014  and 11-21-2016  cardiac cath done both times ,  medically management   History of shingles 12/2017   slight pain and numbness still noted in the area   Hyperlipidemia    Hypertension    Insomnia    Myocardial infarction [facility]) 2015   Persistent atrial fibrillation [facility])    cardiologsit-- dr hochrein   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 Kennyth   Thoracic aortic atherosclerosis (HCC)    Type 2 diabetes mellitus (HCC)    Urinary frequency     Past Surgical History:  Procedure Laterality Date   CARDIAC CATHETERIZATION N/A 11/21/2016   Procedure: Left Heart Cath and Coronary Angiography;  Surgeon: Debby DELENA Sor, MD;  Location: [facility] INVASIVE CV LAB;  Service: Cardiovascular;  Laterality: N/A;  pRCA 100% , ostial LAD 45%, OM3 80%, mCFX to dCFX 100% (AV groove), lateral OM3 50%   COLONOSCOPY     CYSTOSCOPY WITH BIOPSY N/A 08/12/2020   Procedure: CYSTOSCOPY WITH BLADDER BIOPSY AND TRANSURETHRAL RESECTION OF BLADDER TUMOR;  Surgeon: [name], MD;  Location: [facility] ORS;  Service: Urology;  Laterality: N/A;   CYSTOSCOPY WITH BIOPSY N/A 11/10/2021   Procedure: CYSTOSCOPY WITH BLADDER BIOPSIES/ LEFT RETROGRADE;  Surgeon: [name], MD;  Location: [facility];  Service: Urology;  Laterality: N/A;   CYSTOSCOPY WITH BIOPSY Left 07/27/2022   Procedure: CYSTOSCOPY WITH  BLADDER BIOPSY, TRANSURETHRAL FULGERATION OF BLADDER, EXAM UNDER ANESTHESIA, LEFT RETROGRADE PYELOGRAM;  Surgeon: [name], MD;  Location: [facility];  Service: Urology;  Laterality: Left;   CYSTOSCOPY WITH BIOPSY N/A 04/23/2023   Procedure: CYSTOSCOPY WITH BLADDER AND PROSTATIC URETHRAL BIOPSIES;  Surgeon: [name], MD;  Location: [facility];  Service: Urology;  Laterality: N/A;  60 MINUTES NEEDED FOR CASE  CYSTOSCOPY WITH BIOPSY N/A 03/06/2024   Procedure: CYSTOSCOPY, WITH BIOPSY;  Surgeon: [name], MD;  Location: [facility];  Service: Urology;  Laterality: N/A;  CYSTOSCOPY WITH BLADDER BIOPSIES   CYSTOSCOPY WITH FULGERATION N/A 03/06/2024   Procedure: CYSTOSCOPY, WITH BLADDER FULGURATION, TURBT;  Surgeon: [name], MD;  Location: [facility];  Service: Urology;  Laterality: N/A;   CYSTOSCOPY WITH URETEROSCOPY AND STENT PLACEMENT Right 12/15/2019   Procedure: CYSTOSCOPY WITH RIGHT RETROGRADE/ RIGHT URETEROSCOPY/ BIOPSY;  Surgeon: Ottelin, Mark, MD;   Location: [facility] [place];  Service: Urology;  Laterality: Right;   CYSTOSCOPY/RETROGRADE/URETEROSCOPY Bilateral 03/18/2018   Procedure: CYSTOSCOPY/RETROGRADE/URETEROSCOPY.;  Surgeon: Ottelin, Mark, MD;  Location: [facility];  Service: Urology;  Laterality: Bilateral;   EYE SURGERY Bilateral    cateract in January 2023   HYDROCELE EXCISION Left 07/27/2022   Procedure: HYDROCELE REPAIR;  Surgeon: [name], MD;  [facility];  Service: Urology;  Laterality: Left;  GENERAL ANESTHESIA WITH PARALYSIS   IR FLUORO PROCEDURE UNLISTED  06/13/2024   IR NEPHROSTOMY EXCHANGE LEFT  03/10/2024   IR NEPHROSTOMY EXCHANGE LEFT  04/28/2024   IR NEPHROSTOMY EXCHANGE LEFT  06/27/2024   IR NEPHROSTOMY PLACEMENT LEFT  03/07/2024   KNEE ARTHROSCOPY Right    LEFT ATRIAL APPENDAGE OCCLUSION N/A 12/13/2023   Procedure: LEFT ATRIAL APPENDAGE OCCLUSION;  Surgeon: [name], MD;  Location: [facility] INVASIVE CV LAB;  Service: Cardiovascular;  Laterality: N/A;   LEFT HEART CATHETERIZATION WITH CORONARY ANGIOGRAM N/A 02/04/2014   Procedure: LEFT HEART CATHETERIZATION WITH CORONARY ANGIOGRAM;  Surgeon: Deatrice DELENA Cage, MD;  Location: [facility] CATH LAB;  Service: Cardiovascular;  Laterality: N/A;  severe one-vessel CAD, chronically occluded RCA with faint left-to-right collaterals;  aneurysmal LCFx with sluggish coronary flow;  normal LVSF w/ moderately elevated LVEDP (ostialOM2 20%, pOM3 20%, pD1 20%, mCFX 50%, diffuse 20% pCFX)   PROSTATE BIOPSY N/A 08/12/2020   Procedure: BIOPSY TRANSRECTAL ULTRASONIC PROSTATE (TUBP);  Surgeon: [name], MD;  [facility];  Service: Urology;  Laterality: N/A;   ROBOT ASSITED LAPAROSCOPIC NEPHROURETERECTOMY Right 03/22/2020   Procedure: XI ROBOT ASSITED LAPAROSCOPIC NEPHROURETERECTOMY;  Surgeon: [name], MD;  Location: [facility];  Service: Urology;  Laterality: Right;   TRANSESOPHAGEAL ECHOCARDIOGRAM (CATH LAB) N/A 12/13/2023   Procedure: TRANSESOPHAGEAL  ECHOCARDIOGRAM;  Surgeon: [name], MD;  Location: [facility] INVASIVE CV LAB;  Service: Cardiovascular;  Laterality: N/A;   TRANSESOPHAGEAL ECHOCARDIOGRAM (CATH LAB) N/A 02/11/2024   Procedure: TRANSESOPHAGEAL ECHOCARDIOGRAM;  Surgeon: Barbaraann Darryle Ned, MD;  Location: [facility] INVASIVE CV LAB;  Service: Cardiovascular;  Laterality: N/A;   TRANSTHORACIC ECHOCARDIOGRAM  11-22-2016   dr hochrein   ef 55-60%/ mild MR and TR/ moderate LAE   TRANSURETHRAL RESECTION OF BLADDER TUMOR N/A 02/10/2021   Procedure: TRANSURETHRAL RESECTION OF BLADDER TUMOR (TURBT)/ CYSTOSCOPY/ LEFT RETROGRADE;  Surgeon: [name], MD;  Location: [facility];  Service: Urology;  Laterality: N/A;  GENERAL ANESTHESIA WITH PARALYSIS    Current Hospital Medications:  Home Meds:  No current facility-administered medications on file prior to encounter.   Current Outpatient Medications on File Prior to Encounter  Medication Sig Dispense Refill   aspirin  EC 81 MG tablet Take 1 tablet (81 mg total) by mouth daily. Swallow whole. 30 tablet 12   Carboxymethylcell-Glycerin  PF 0.5-0.9 % SOLN Place 2 drops into both eyes 4 (four) times daily. 1 each 11   isosorbide  mononitrate (IMDUR ) 30 MG 24 hr tablet Take 1 tablet (30 mg total) by mouth daily.     linaclotide  (  LINZESS ) 145 MCG CAPS capsule Take 1 capsule (145 mcg total) by mouth daily before breakfast. 30 capsule 3   megestrol  ([name]  ES) 625 MG/5ML suspension Take 5 mLs (625 mg total) by mouth daily. 150 mL 0   metoprolol  tartrate (LOPRESSOR ) 25 MG tablet TAKE ONE TABLET BY MOUTH TWICE A DAY 180 tablet 2   mirtazapine  (REMERON ) 30 MG tablet Take 1 tablet (30 mg total) by mouth at bedtime. 30 tablet 0   olmesartan  (BENICAR ) 20 MG tablet Take 20 mg by mouth daily.     ondansetron  (ZOFRAN ) 8 MG tablet Take 1 tablet (8 mg total) by mouth every 8 (eight) hours as needed for nausea or vomiting. 30 tablet 1   [START ON 07/02/2024] oxyCODONE  (OXY IR/ROXICODONE ) 5 MG immediate release tablet  Take 1-2 tablets (5-10 mg total) by mouth every 4 (four) hours as needed for severe pain (pain score 7-10). 60 tablet 0   polyethylene glycol (MIRALAX  / GLYCOLAX ) 17 g packet Take 17 g by mouth daily as needed for mild constipation. 14 each 0   prochlorperazine  (COMPAZINE ) 10 MG tablet Take 1 tablet (10 mg total) by mouth every 6 (six) hours as needed for nausea or vomiting. 30 tablet 1   rosuvastatin  (CRESTOR ) 40 MG tablet TAKE 1 TABLET (40 MG TOTAL) BY MOUTH EVERY MORNING. PLEASE KEEP SCHEDULED APPOINTMENT 90 tablet 3   sitaGLIPtin  (JANUVIA ) 50 MG tablet Take 1 tablet (50 mg total) by mouth daily. 90 tablet 3   sodium bicarbonate  650 MG tablet Take 650 mg by mouth 2 (two) times daily.     hydrOXYzine  (VISTARIL ) 25 MG capsule Take 1 capsule (25 mg total) by mouth every 8 (eight) hours as needed. 30 capsule 2   lidocaine -prilocaine  (EMLA ) cream Apply to affected area once (Patient not taking: Reported on 06/28/2024) 30 g 3   tamsulosin  (FLOMAX ) 0.4 MG CAPS capsule TAKE ONE CAPSULE BY MOUTH DAILY (Patient not taking: Reported on 06/28/2024) 30 capsule 11   traZODone  (DESYREL ) 50 MG tablet Take 0.5-1 tablets (25-50 mg total) by mouth at bedtime as needed for sleep. (Patient not taking: Reported on 06/28/2024) 30 tablet 3     Scheduled Meds:  artificial tears  2 drop Both Eyes QID   aspirin  EC  81 mg Oral Daily   enoxaparin  (LOVENOX ) injection  40 mg Subcutaneous Q24H   feeding supplement  237 mL Oral BID BM   Gerhardt's butt cream   Topical BID   insulin  aspart  0-9 Units Subcutaneous Q4H   isosorbide  mononitrate  15 mg Oral Daily   linaclotide   145 mcg Oral QAC breakfast   metoprolol  tartrate  25 mg Oral TID   mirtazapine   15 mg Oral QHS   sodium bicarbonate   650 mg Oral BID   sodium chloride  flush  3 mL Intravenous Q12H   tamsulosin   0.4 mg Oral Daily   Continuous Infusions:  DAPTOmycin  900 mg (07/01/24 1156)   PRN Meds:.acetaminophen  **OR** acetaminophen , albuterol , metoprolol  tartrate,  ondansetron  **OR** ondansetron  (ZOFRAN ) IV, oxyCODONE , polyethylene glycol  Allergies:  Allergies  Allergen Reactions   Xarelto  [Rivaroxaban ] Other (See Comments)    Skin Blisters     Family History  Problem Relation Age of Onset   Hypertension Mother    Cancer Mother        anal cancer   Esophageal cancer Neg Hx    Pancreatic cancer Neg Hx    Stomach cancer Neg Hx    Liver disease Neg Hx     Social  History:  reports that he quit smoking about 53 years ago. His smoking use included cigarettes. He started smoking about 57 years ago. He has a 1 pack-year smoking history. He has been exposed to tobacco smoke. He quit smokeless tobacco use about 53 years ago.  His smokeless tobacco use included chew. He reports that he does not drink alcohol  and does not use drugs.  ROS: A complete review of systems was performed.  All systems are negative except for pertinent findings as noted.  Physical Exam:  Vital signs in last 24 hours: Temp:  [97.6 F (36.4 C)-98.3 F (36.8 C)] 98.3 F (36.8 C) (08/19 0810) Pulse Rate:  [87-108] 108 (08/19 0951) Resp:  [15-20] 16 (08/19 0951) BP: (137-160)/(85-112) 138/100 (08/19 0951) SpO2:  [94 %-99 %] 96 % (08/19 0810) Weight:  [91.5 kg] 91.5 kg (08/19 0500) Constitutional:  Alert and oriented, No acute distress Cardiovascular: No JVD GI: Abdomen is soft, nontender, nondistended, no abdominal masses GU: Left PCN draining well with clear urine. Lymphatic: No lymphadenopathy Neurologic: Grossly intact, no focal deficits Psychiatric: Normal mood and affect  Laboratory Data:  Recent Labs    06/29/24 1022 06/30/24 0950 07/01/24 0710  WBC 23.9* 16.5* 11.6*  HGB 10.5* 10.1* 10.4*  HCT 34.0* 32.3* 33.4*  PLT 222 216 212    Recent Labs    06/29/24 1022 06/30/24 0950 07/01/24 0710  NA 133* 139 135  K 4.2 3.9 3.7  CL 109 107 108  GLUCOSE 143* 193* 104*  BUN 31* 39* 36*  CALCIUM  8.1* 8.3* 8.1*  CREATININE 2.10* 2.36* 2.09*     Results  for orders placed or performed during the hospital encounter of 06/27/24 (from the past 24 hours)  Culture, blood (Routine X 2) w Reflex to ID Panel     Status: None (Preliminary result)   Collection Time: 06/30/24  2:14 PM   Specimen: BLOOD  Result Value Ref Range   Specimen Description BLOOD SITE NOT SPECIFIED    Special Requests      BOTTLES DRAWN AEROBIC ONLY Blood Culture adequate volume   Culture      NO GROWTH < 24 HOURS Performed at [facility], [address] [place], [place] [address]    Report Status PENDING   Culture, blood (Routine X 2) w Reflex to ID Panel     Status: None (Preliminary result)   Collection Time: 06/30/24  2:15 PM   Specimen: BLOOD LEFT HAND  Result Value Ref Range   Specimen Description BLOOD LEFT HAND    Special Requests      BOTTLES DRAWN AEROBIC AND ANAEROBIC Blood Culture adequate volume   Culture      NO GROWTH < 24 HOURS Performed at [facility], [address], [place], [place] [address]    Report Status PENDING   Glucose, capillary     Status: Abnormal   Collection Time: 06/30/24  4:09 PM  Result Value Ref Range   Glucose-Capillary 152 (H) 70 - 99 mg/dL  Glucose, capillary     Status: Abnormal   Collection Time: 06/30/24  9:39 PM  Result Value Ref Range   Glucose-Capillary 105 (H) 70 - 99 mg/dL  Glucose, capillary     Status: Abnormal   Collection Time: 07/01/24 12:49 AM  Result Value Ref Range   Glucose-Capillary 119 (H) 70 - 99 mg/dL  Glucose, capillary     Status: Abnormal   Collection Time: 07/01/24  4:46 AM  Result Value Ref Range  Glucose-Capillary 108 (H) 70 - 99 mg/dL  Comprehensive metabolic panel     Status: Abnormal   Collection Time: 07/01/24  7:10 AM  Result Value Ref Range   Sodium 135 135 - 145 mmol/L   Potassium 3.7 3.5 - 5.1 mmol/L   Chloride 108 98 - 111 mmol/L   CO2 19 (L) 22 - 32 mmol/L   Glucose, Bld 104 (H) 70 - 99 mg/dL   BUN 36 (H) 8 - 23 mg/dL   Creatinine, Ser 7.90 (H) 0.61 - 1.24 mg/dL    Calcium  8.1 (L) 8.9 - 10.3 mg/dL   Total Protein 7.0 6.5 - 8.1 g/dL   Albumin  1.8 (L) 3.5 - 5.0 g/dL   AST 41 15 - 41 U/L   ALT 23 0 - 44 U/L   Alkaline Phosphatase 130 (H) 38 - 126 U/L   Total Bilirubin 1.3 (H) 0.0 - 1.2 mg/dL   GFR, Estimated 32 (L) >60 mL/min   Anion gap 8 5 - 15  CBC with Differential/Platelet     Status: Abnormal   Collection Time: 07/01/24  7:10 AM  Result Value Ref Range   WBC 11.6 (H) 4.0 - 10.5 K/uL   RBC 4.51 4.22 - 5.81 MIL/uL   Hemoglobin 10.4 (L) 13.0 - 17.0 g/dL   HCT 66.5 (L) 60.9 - 47.9 %   MCV 74.1 (L) 80.0 - 100.0 fL   MCH 23.1 (L) 26.0 - 34.0 pg   MCHC 31.1 30.0 - 36.0 g/dL   RDW 79.0 (H) 88.4 - 84.4 %   Platelets 212 150 - 400 K/uL   nRBC 0.0 0.0 - 0.2 %   Neutrophils Relative % 94 %   Neutro Abs 10.9 (H) 1.7 - 7.7 K/uL   Lymphocytes Relative 2 %   Lymphs Abs 0.2 (L) 0.7 - 4.0 K/uL   Monocytes Relative 4 %   Monocytes Absolute 0.5 0.1 - 1.0 K/uL   Eosinophils Relative 0 %   Eosinophils Absolute 0.0 0.0 - 0.5 K/uL   Basophils Relative 0 %   Basophils Absolute 0.0 0.0 - 0.1 K/uL   WBC Morphology See Note    RBC Morphology MORPHOLOGY UNREMARKABLE    Smear Review See Note   Magnesium      Status: None   Collection Time: 07/01/24  7:10 AM  Result Value Ref Range   Magnesium  2.0 1.7 - 2.4 mg/dL  Glucose, capillary     Status: Abnormal   Collection Time: 07/01/24  8:06 AM  Result Value Ref Range   Glucose-Capillary 110 (H) 70 - 99 mg/dL   Comment 1 Notify RN    Comment 2 Document in Chart   Glucose, capillary     Status: Abnormal   Collection Time: 07/01/24 11:23 AM  Result Value Ref Range   Glucose-Capillary 133 (H) 70 - 99 mg/dL   Comment 1 Notify RN    Comment 2 Document in Chart    Recent Results (from the past 240 hours)  Urine Culture     Status: Abnormal   Collection Time: 06/27/24 12:00 AM  Result Value Ref Range Status   MICRO NUMBER: [id number]  Final   SPECIMEN QUALITY: Adequate  Final   Sample Source URINE  Final    STATUS: FINAL  Final   ISOLATE 1: Proteus mirabilis (A)  Final    Comment: Greater than 100,000 CFU/mL of Proteus mirabilis      Susceptibility   Proteus mirabilis - URINE CULTURE, REFLEX    AMOX/CLAVULANIC <=2 Sensitive  AMPICILLIN/SULBACTAM <=2 Sensitive     CEFAZOLIN * >=32 Resistant      * For uncomplicated UTI caused by E. coli, K. pneumoniae or P. mirabilis: Cefazolin  is susceptible if MIC <32 mcg/mL and predicts susceptible to the oral agents cefaclor, cefdinir, cefpodoxime, cefprozil, cefuroxime, cephalexin  and loracarbef.     CEFTAZIDIME <=0.5 Sensitive     CEFEPIME  4 Intermediate     CEFTRIAXONE  <=0.25 Sensitive     CIPROFLOXACIN  <=0.06 Sensitive     LEVOFLOXACIN <=0.12 Sensitive     GENTAMICIN <=1 Sensitive     MEROPENEM 1 Sensitive     NITROFURANTOIN  128 Resistant     PIP/TAZO <=4 Sensitive     TRIMETH /SULFA * <=20 Sensitive      * For uncomplicated UTI caused by E. coli, K. pneumoniae or P. mirabilis: Cefazolin  is susceptible if MIC <32 mcg/mL and predicts susceptible to the oral agents cefaclor, cefdinir, cefpodoxime, cefprozil, cefuroxime, cephalexin  and loracarbef. Legend: S = Susceptible  I = Intermediate R = Resistant  NS = Not susceptible SDD = Susceptible Dose Dependent * = Not Tested  NR = Not Reported **NN = See Therapy Comments   Blood Culture (routine x 2)     Status: Abnormal   Collection Time: 06/28/24  1:10 AM   Specimen: BLOOD RIGHT ARM  Result Value Ref Range Status   Specimen Description BLOOD RIGHT ARM  Final   Special Requests   Final    BOTTLES DRAWN AEROBIC AND ANAEROBIC Blood Culture adequate volume   Culture  Setup Time   Final    GRAM POSITIVE COCCI IN CLUSTERS AEROBIC BOTTLE ONLY CRITICAL VALUE NOTED.  VALUE IS CONSISTENT WITH PREVIOUSLY REPORTED AND CALLED VALUE.    Culture (A)  Final    STAPHYLOCOCCUS AUREUS SUSCEPTIBILITIES PERFORMED ON PREVIOUS CULTURE WITHIN THE LAST 5 DAYS. Performed at [facility], [address] [place] [address], [place], [place] [address]    Report Status 06/30/2024 FINAL  Final  Urine Culture     Status: Abnormal   Collection Time: 06/28/24  1:11 AM   Specimen: Urine, Random  Result Value Ref Range Status   Specimen Description URINE, RANDOM  Final   Special Requests   Final    NONE Reflexed from [id number] Performed at [facility], [address], [place], [place] [address]    Culture (A)  Final    >=100,000 COLONIES/mL METHICILLIN RESISTANT STAPHYLOCOCCUS AUREUS >=100,000 COLONIES/mL ENTEROCOCCUS FAECALIS    Report Status 06/30/2024 FINAL  Final   Organism ID, Bacteria METHICILLIN RESISTANT STAPHYLOCOCCUS AUREUS (A)  Final   Organism ID, Bacteria ENTEROCOCCUS FAECALIS (A)  Final      Susceptibility   Enterococcus faecalis - MIC*    AMPICILLIN <=2 SENSITIVE Sensitive     NITROFURANTOIN  <=16 SENSITIVE Sensitive     VANCOMYCIN  1 SENSITIVE Sensitive     * >=100,000 COLONIES/mL ENTEROCOCCUS FAECALIS   Methicillin resistant staphylococcus aureus - MIC*    CIPROFLOXACIN  >=8 RESISTANT Resistant     GENTAMICIN <=0.5 SENSITIVE Sensitive     NITROFURANTOIN  <=16 SENSITIVE Sensitive     OXACILLIN >=4 RESISTANT Resistant     TETRACYCLINE <=1 SENSITIVE Sensitive     VANCOMYCIN  1 SENSITIVE Sensitive     TRIMETH /SULFA  >=320 RESISTANT Resistant     RIFAMPIN <=0.5 SENSITIVE Sensitive     Inducible Clindamycin NEGATIVE Sensitive     LINEZOLID 2 SENSITIVE Sensitive     * >=100,000 COLONIES/mL METHICILLIN RESISTANT STAPHYLOCOCCUS AUREUS  Blood Culture (routine x 2)  Status: Abnormal (Preliminary result)   Collection Time: 06/28/24  2:25 AM   Specimen: BLOOD LEFT ARM  Result Value Ref Range Status   Specimen Description BLOOD LEFT ARM  Final   Special Requests   Final    BOTTLES DRAWN AEROBIC AND ANAEROBIC Blood Culture adequate volume   Culture  Setup Time   Final    GRAM POSITIVE COCCI IN BOTH AEROBIC AND ANAEROBIC BOTTLES CRITICAL RESULT CALLED TO, READ BACK BY AND VERIFIED WITH:  PHARMD MICHAEL BITONTI [id number] 1911 BY J RAZZAK,MT    Culture (A)  Final    METHICILLIN RESISTANT STAPHYLOCOCCUS AUREUS ENTEROCOCCUS FAECALIS SUSCEPTIBILITIES TO FOLLOW CRITICAL RESULT CALLED TO, READ BACK BY AND VERIFIED WITH: PHARMD E.MARTIN AT 1026 ON 07/02/2024 BY T.SAAD. Sent to [facility] for further susceptibility testing. Performed at [facility], [address], [place], [place] [address]    Report Status PENDING  Incomplete   Organism ID, Bacteria METHICILLIN RESISTANT STAPHYLOCOCCUS AUREUS  Final      Susceptibility   Methicillin resistant staphylococcus aureus - MIC*    CIPROFLOXACIN  >=8 RESISTANT Resistant     ERYTHROMYCIN <=0.25 SENSITIVE Sensitive     GENTAMICIN <=0.5 SENSITIVE Sensitive     OXACILLIN >=4 RESISTANT Resistant     TETRACYCLINE <=1 SENSITIVE Sensitive     VANCOMYCIN  1 SENSITIVE Sensitive     TRIMETH /SULFA  >=320 RESISTANT Resistant     CLINDAMYCIN <=0.25 SENSITIVE Sensitive     RIFAMPIN <=0.5 SENSITIVE Sensitive     Inducible Clindamycin NEGATIVE Sensitive     LINEZOLID 2 SENSITIVE Sensitive     * METHICILLIN RESISTANT STAPHYLOCOCCUS AUREUS  Blood Culture ID Panel (Reflexed)     Status: Abnormal   Collection Time: 06/28/24  2:25 AM  Result Value Ref Range Status   Enterococcus faecalis NOT DETECTED NOT DETECTED Final   Enterococcus Faecium NOT DETECTED NOT DETECTED Final   Listeria monocytogenes NOT DETECTED NOT DETECTED Final   Staphylococcus species DETECTED (A) NOT DETECTED Final    Comment: CRITICAL RESULT CALLED TO, READ BACK BY AND VERIFIED WITH: PHARMD MICHAEL BITONTI [id number] 1911 BY J RAZZAK, MT    Staphylococcus aureus (BCID) DETECTED (A) NOT DETECTED Final    Comment: Methicillin (oxacillin)-resistant Staphylococcus aureus (MRSA). MRSA is predictably resistant to beta-lactam antibiotics (except ceftaroline). Preferred therapy is vancomycin  unless clinically contraindicated. Patient requires contact precautions if   hospitalized. CRITICAL RESULT CALLED TO, READ BACK BY AND VERIFIED WITH: PHARMD MICHAEL BITONTI [id number] 1911 BY J RAZZAK, MT    Staphylococcus epidermidis NOT DETECTED NOT DETECTED Final   Staphylococcus lugdunensis NOT DETECTED NOT DETECTED Final   Streptococcus species NOT DETECTED NOT DETECTED Final   Streptococcus agalactiae NOT DETECTED NOT DETECTED Final   Streptococcus pneumoniae NOT DETECTED NOT DETECTED Final   Streptococcus pyogenes NOT DETECTED NOT DETECTED Final   A.calcoaceticus-baumannii NOT DETECTED NOT DETECTED Final   Bacteroides fragilis NOT DETECTED NOT DETECTED Final   Enterobacterales NOT DETECTED NOT DETECTED Final   Enterobacter cloacae complex NOT DETECTED NOT DETECTED Final   Escherichia coli NOT DETECTED NOT DETECTED Final   Klebsiella aerogenes NOT DETECTED NOT DETECTED Final   Klebsiella oxytoca NOT DETECTED NOT DETECTED Final   Klebsiella pneumoniae NOT DETECTED NOT DETECTED Final   Proteus species NOT DETECTED NOT DETECTED Final   Salmonella species NOT DETECTED NOT DETECTED Final   Serratia marcescens NOT DETECTED NOT DETECTED Final   Haemophilus influenzae NOT DETECTED NOT DETECTED Final   Neisseria meningitidis NOT DETECTED NOT DETECTED Final   Pseudomonas  aeruginosa NOT DETECTED NOT DETECTED Final   Stenotrophomonas maltophilia NOT DETECTED NOT DETECTED Final   Candida albicans NOT DETECTED NOT DETECTED Final   Candida auris NOT DETECTED NOT DETECTED Final   Candida glabrata NOT DETECTED NOT DETECTED Final   Candida krusei NOT DETECTED NOT DETECTED Final   Candida parapsilosis NOT DETECTED NOT DETECTED Final   Candida tropicalis NOT DETECTED NOT DETECTED Final   Cryptococcus neoformans/gattii NOT DETECTED NOT DETECTED Final   Meth resistant mecA/C and MREJ DETECTED (A) NOT DETECTED Final    Comment: CRITICAL RESULT CALLED TO, READ BACK BY AND VERIFIED WITH: MAYA OZELL JAMA[place] [id number] 1911 BY JINNY COMMON, MT Performed at [facility]  Lab, [address] [place], [place] [address]   Culture, blood (Routine X 2) w Reflex to ID Panel     Status: None (Preliminary result)   Collection Time: 06/30/24  2:14 PM   Specimen: BLOOD  Result Value Ref Range Status   Specimen Description BLOOD SITE NOT SPECIFIED  Final   Special Requests   Final    BOTTLES DRAWN AEROBIC ONLY Blood Culture adequate volume   Culture   Final    NO GROWTH < 24 HOURS Performed at [facility], [address], [place], [place] [address]    Report Status PENDING  Incomplete  Culture, blood (Routine X 2) w Reflex to ID Panel     Status: None (Preliminary result)   Collection Time: 06/30/24  2:15 PM   Specimen: BLOOD LEFT HAND  Result Value Ref Range Status   Specimen Description BLOOD LEFT HAND  Final   Special Requests   Final    BOTTLES DRAWN AEROBIC AND ANAEROBIC Blood Culture adequate volume   Culture   Final    NO GROWTH < 24 HOURS Performed at [facility], [address] [place], [place] [address]    Report Status PENDING  Incomplete    Renal Function: Recent Labs    06/25/24 1023 06/27/24 0000 06/28/24 0111 06/29/24 1022 06/30/24 0950 07/01/24 0710  CREATININE 1.94* 1.68* 1.96* 2.10* 2.36* 2.09*   Estimated Creatinine Clearance: 34.2 mL/min (A) (by C-G formula based on SCr of 2.09 mg/dL (H)).  Radiologic Imaging:   I independently reviewed the above imaging studies.  Impression/Recommendation Left ureteral obstruction:  Continue PCN drainage.  I don't think there would be any benefit to changing out the nephrostomy since this was just placed.  He appears to be responding to antibiotic therapy.  Will need long term PCN drainage of the left kidney.  Noretta Ferrara 07/01/2024, 12:59 PM    Gretel CANDIE Ferrara Teddie MD   CC: Dr. Royal

## 2024-07-01 NOTE — Progress Notes (Signed)
   [facility] has been requested to perform a transesophageal echocardiogram on Adam Harris for MRSA Bacteremia.    Has metastatic urothelial carcinoma and has a poor prognosis with the goal of his care being palliative. Has had generalized weakness, recent falls, and a poor appetite. Denies dysphagia.  Has chronic atrial fibrillation and received a watchman earlier this year and had a TEE performed on 01/2024.   The patient does NOT have any absolute or relative contraindications to a Transesophageal Echocardiogram (TEE).  The patient has: No other conditions that may impact this procedure.   After careful review of history and examination, the risks and benefits of transesophageal echocardiogram have been explained including risks of esophageal damage, perforation (1:10,000 risk), bleeding, pharyngeal hematoma as well as other potential complications associated with conscious sedation including aspiration, arrhythmia, respiratory failure and death. Alternatives to treatment were discussed, questions were answered. Patient is willing to proceed.   Bonney Morse Clause, PA-C  07/01/2024 2:45 PM

## 2024-07-01 NOTE — Progress Notes (Signed)
 TR[facility]   ROUNDING   NOTE Adam Harris FMW:[id number]  DOB: March 05, 1946  DOA: 06/27/2024  PCP: Adam Garnette HERO, MD  07/01/2024,9:24 AM  LOS: 3 days    Code Status: Full code     from: Home current Dispo: Unclear    78 year old home dwelling white male Urothelial cancer + nephrostomy, prostate cancer (BCG induction RR AL nephroureterectomy 03/22/2020--his disease is refractory to BCG) followed with Dr. Rockland therapy Padcev  Keytruda  Complication with Keytruda  = pneumonitis decided to continue the same CT 06/01/2024 worsening pelvic/retroperitoneal adenopathy = metastatic urothelial CA with PET scan 8/1 extensive hypermetabolic pelvic nodes etc. He was seen in the office by Dr. 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 unamenable to guidewire passage Septic on admission WBC 27 Tmax 101.8 AKI BUN/creatinine 32/1.9 scans suggestive adenopathy Infectious disease consulted 2/2 MRSA bacteremia second urine showing E faecalis staph 8/17 CT chest mild pulmonary edema left upper lobe patchy groundglass opacities?  Infection?  Inflammation 8/18 echocardiogram LVEF 60-65 elevated PASP-valves no clear regurgitation   Plan    Sepsis secondary to methicillin-resistant Staphylococcus aureus as well as Enterococcus faecalis BC X2--8/16 MRSA 2 bottles BC X 2 repeat 8/18 no growth Daptomycin /ceftriaxone  or as per ID duration to be determined ?  Seeding from urostomy replaced recently which was exchanged around 8/15 with IR Echocardiogram 8/18 as above-further workup and discussion as per ID-I am not sure he is a good candidate for a TEE.  Tachypnea tachycardia previously Felt to be combination of fluid overload (resuscitation from sepsis)  versus pneumonia based on imaging as above Antibiotics continue Keep volumes even no Lasix  no fluid today Weight stable 91 kg range  Polypharmacy Stopped AvaPro  150 hydroxyzine  25   Urothelial cancer with metastases based on recent PET scan --has left lighted draining nephrostomy  prostate cancer followed by oncology given only 1 dose Keytruda  Padcev -intent of therapy is palliative Continue Flomax  Urology formally engaged to discuss planning regarding left-sided nephrostomy and recommend they engage with IR Historically these exchanges of the left-sided urostomy have been difficult and complicated by decompensation of the patient per verbal report from urology Nephrostomy is draining fairly well he does not pass urine via his bladder or penis Palliative care engaged-he may be hospice eligible if he continues to decline  Microcytic anemia secondary to likely malignancy  AKI with metabolic acidosis secondary to sepsis (baseline creatinine 1.6)) Allow creatinine to trend down slowly would not give fluids as per above discussion Acidosis seems to be improving slightly-continue sodium bicarb 650 twice daily  Falls has bled score >3 Vtach 20 beats Permanent A-fib not on anticoagulation [place] vas >2--has watchman device placed 11/2023 Continue metoprolol  25 3 times daily and continues on Imdur  15-holding Crestor --as needed metoprolol  2.5 if heart rate >120  HFpEF last echo 60-65% See above medications  Uncontrolled diabetes mellitus with risk of hypoglycemia CBGs ranging 100 low range Use sliding scale alone  Insomnia-resume home Remeron  30  Updated earlier Adam Harris--[phone]    Data Reviewed:   Sodium 135 potassium 3.7 CO2 19 BUN/creatinine 36/2.09 alk phos 138 albumin  1.8 total bili 1.3 WBC 11.6 hemoglobin 10.4 platelet 212  DVT prophylaxis: Lovenox   Status is: Inpatient Remains inpatient appropriate because:   Sick     Subjective:  Awake alert apologetic that he  was  not able to get some out of bed Otherwise he seems stable and is eating some with occasional nausea His heart rate still somewhat elevated He is not coughing up any sputum  Overall he seems improved He cannot tell me the timeframe where he got sick-it looks like this was after the nephrostomy was exchanged however  We resume some of his pain meds to help him with his chronic underlying pain as he is not encephalopathic anymore  He had a good conversation with Adam Harris and understands that his prognosis is terminal Palliative input is pending  Objective + exam Vitals:   06/30/24 2004 07/01/24 0500 07/01/24 0810 07/01/24 0817  BP: 137/85  (!) 160/108 (!) 144/100  Pulse: 94  (!) 107   Resp: 15  20   Temp: 97.7 F (36.5 C)  98.3 F (36.8 C)   TempSrc: Oral  Oral   SpO2: 94%  96%   Weight:  91.5 kg    Height:       Filed Weights   06/29/24 0500 06/30/24 0500 07/01/24 0500  Weight: 92.7 kg 90 kg 91.5 kg     Examination:  Coherent alert no distress Equivocal JVD Arcus [name] Looks a little improved compared to prior but still quite weak I only hear scattered crackles in his lungs but no rales no rhonchi S1-S2 somewhat tachycardic 90s to 100s improved from yesterday-telemetry reviewed some SVT noted but no V. tach Power is 5/5 Abdomen is soft I do not appreciate any edema Left-sided nephrostomy draining good amount of clear draining urine    Scheduled Meds:  artificial tears  2 drop Both Eyes QID   aspirin  EC  81 mg Oral Daily   enoxaparin  (LOVENOX ) injection  40 mg Subcutaneous Q24H   feeding supplement  237 mL Oral BID BM   Gerhardt's butt cream   Topical BID   insulin  aspart  0-9 Units Subcutaneous Q4H   isosorbide  mononitrate  15 mg Oral Daily   linaclotide   145 mcg Oral QAC breakfast   metoprolol  tartrate  25 mg Oral TID   mirtazapine   15 mg Oral QHS   sodium bicarbonate   650 mg Oral BID   sodium chloride  flush  3 mL Intravenous Q12H   tamsulosin   0.4 mg Oral  Daily   Continuous Infusions:  DAPTOmycin       Time   Adam Jarae Panas, MD  [facility]

## 2024-07-01 NOTE — Care Management Important Message (Signed)
 Important Message  Patient Details  Name: Adam Harris MRN: [id number] Date of Birth: September 25, 1946   Important Message Given:  Yes - Medicare IM     Jon Cruel 07/01/2024, 2:42 PM

## 2024-07-01 NOTE — Progress Notes (Signed)
 PHARMACY NOTE:  ANTIMICROBIAL RENAL DOSAGE ADJUSTMENT  Current antimicrobial regimen includes a mismatch between antimicrobial dosage and estimated renal function.  As per policy approved by the Pharmacy & Therapeutics and Medical Executive Committees, the antimicrobial dosage will be adjusted accordingly.  Current antimicrobial dosage:  Daptomycin  700 mg IV every 48 hours  Indication: MRSA bacteremia  Renal Function:  Estimated Creatinine Clearance: 34.2 mL/min (A) (by C-G formula based on SCr of 2.09 mg/dL (H)). []      On intermittent HD, scheduled: []      On CRRT    Antimicrobial dosage has been changed to:  Daptomycin  700 mg IV every 24 hours  Additional comments:   Thank you for allowing pharmacy to be a part of this patient's care.  Almarie Lunger, PharmD, BCPS, BCIDP Infectious Diseases Clinical Pharmacist 07/01/2024 9:56 AM   **Pharmacist phone directory can now be found on [email or web address] ([email or web address]).  Listed under [facility].

## 2024-07-01 NOTE — Hospital Course (Signed)
 Wound type: Stage 3 Pressure Injuries L buttock (2 separate areas)  Pressure Injury POA: Yes Measurement: see nursing flowsheet  Wound bed: largely pink clean Drainage (amount, consistency, odor) see nursing flowsheet  Periwound: moisture associated skin damage to buttocks  Dressing procedure/placement/frequency: Cleanse L buttock wounds with NS, apply silver hydrofiber ([employer] Lawson #[id number]) cut to fit wound beds daily and secure with silicone foam or ABD pad and tape whichever is preferred.    Will write for Gerhardt's Butt Cream to intact skin of buttocks 2 times daily and prn soiling.

## 2024-07-02 ENCOUNTER — Inpatient Hospital Stay

## 2024-07-02 ENCOUNTER — Inpatient Hospital Stay (HOSPITAL_COMMUNITY)

## 2024-07-02 ENCOUNTER — Encounter (HOSPITAL_COMMUNITY)
Admission: EM | Disposition: A | Discharge status: Skilled Nursing Facility | Payer: Self-pay | Source: Home / Self Care | Attending: Internal Medicine

## 2024-07-02 ENCOUNTER — Encounter (HOSPITAL_COMMUNITY): Payer: Self-pay | Admitting: Cardiovascular Disease

## 2024-07-02 ENCOUNTER — Inpatient Hospital Stay: Admitting: Hematology

## 2024-07-02 DIAGNOSIS — Z515 Encounter for palliative care: Secondary | ICD-10-CM

## 2024-07-02 DIAGNOSIS — D509 Iron deficiency anemia, unspecified: Secondary | ICD-10-CM | POA: Diagnosis not present

## 2024-07-02 DIAGNOSIS — A419 Sepsis, unspecified organism: Secondary | ICD-10-CM | POA: Diagnosis not present

## 2024-07-02 DIAGNOSIS — I5032 Chronic diastolic (congestive) heart failure: Secondary | ICD-10-CM

## 2024-07-02 DIAGNOSIS — I110 Hypertensive heart disease with heart failure: Secondary | ICD-10-CM

## 2024-07-02 DIAGNOSIS — I4821 Permanent atrial fibrillation: Secondary | ICD-10-CM | POA: Diagnosis not present

## 2024-07-02 DIAGNOSIS — I361 Nonrheumatic tricuspid (valve) insufficiency: Secondary | ICD-10-CM | POA: Diagnosis not present

## 2024-07-02 DIAGNOSIS — Z7189 Other specified counseling: Secondary | ICD-10-CM

## 2024-07-02 DIAGNOSIS — W19XXXS Unspecified fall, sequela: Secondary | ICD-10-CM

## 2024-07-02 DIAGNOSIS — I25119 Atherosclerotic heart disease of native coronary artery with unspecified angina pectoris: Secondary | ICD-10-CM

## 2024-07-02 DIAGNOSIS — R7881 Bacteremia: Secondary | ICD-10-CM | POA: Diagnosis not present

## 2024-07-02 DIAGNOSIS — B958 Unspecified staphylococcus as the cause of diseases classified elsewhere: Secondary | ICD-10-CM

## 2024-07-02 LAB — CBC WITH DIFFERENTIAL/PLATELET
Abs Granulocyte: 6.4 K/uL (ref 1.5–6.5)
Abs Immature Granulocytes: 0.84 K/uL — ABNORMAL HIGH (ref 0.00–0.07)
Basophils Absolute: 0.1 K/uL (ref 0.0–0.1)
Basophils Relative: 1 %
Eosinophils Absolute: 0.1 K/uL (ref 0.0–0.5)
Eosinophils Relative: 2 %
HCT: 33.7 % — ABNORMAL LOW (ref 39.0–52.0)
Hemoglobin: 10.6 g/dL — ABNORMAL LOW (ref 13.0–17.0)
Immature Granulocytes: 10 %
Lymphocytes Relative: 8 %
Lymphs Abs: 0.7 K/uL (ref 0.7–4.0)
MCH: 23.2 pg — ABNORMAL LOW (ref 26.0–34.0)
MCHC: 31.5 g/dL (ref 30.0–36.0)
MCV: 73.9 fL — ABNORMAL LOW (ref 80.0–100.0)
Monocytes Absolute: 0.6 K/uL (ref 0.1–1.0)
Monocytes Relative: 7 %
Neutro Abs: 6.4 K/uL (ref 1.7–7.7)
Neutrophils Relative %: 72 %
Platelets: 204 K/uL (ref 150–400)
RBC: 4.56 MIL/uL (ref 4.22–5.81)
RDW: 20.8 % — ABNORMAL HIGH (ref 11.5–15.5)
Smear Review: NORMAL
WBC: 8.6 K/uL (ref 4.0–10.5)
nRBC: 0 % (ref 0.0–0.2)

## 2024-07-02 LAB — BASIC METABOLIC PANEL WITH GFR
Anion gap: 9 (ref 5–15)
BUN: 36 mg/dL — ABNORMAL HIGH (ref 8–23)
CO2: 20 mmol/L — ABNORMAL LOW (ref 22–32)
Calcium: 8.1 mg/dL — ABNORMAL LOW (ref 8.9–10.3)
Chloride: 107 mmol/L (ref 98–111)
Creatinine, Ser: 2.02 mg/dL — ABNORMAL HIGH (ref 0.61–1.24)
GFR, Estimated: 33 mL/min — ABNORMAL LOW (ref >60)
Glucose, Bld: 95 mg/dL (ref 70–99)
Potassium: 3.7 mmol/L (ref 3.5–5.1)
Sodium: 136 mmol/L (ref 135–145)

## 2024-07-02 LAB — GLUCOSE, CAPILLARY
Glucose-Capillary: 106 mg/dL — ABNORMAL HIGH (ref 70–99)
Glucose-Capillary: 107 mg/dL — ABNORMAL HIGH (ref 70–99)
Glucose-Capillary: 109 mg/dL — ABNORMAL HIGH (ref 70–99)
Glucose-Capillary: 158 mg/dL — ABNORMAL HIGH (ref 70–99)
Glucose-Capillary: 200 mg/dL — ABNORMAL HIGH (ref 70–99)
Glucose-Capillary: 84 mg/dL (ref 70–99)

## 2024-07-02 LAB — ECHO TEE

## 2024-07-02 SURGERY — TRANSESOPHAGEAL ECHOCARDIOGRAM (TEE) (CATHLAB)
Anesthesia: Monitor Anesthesia Care

## 2024-07-02 MED ORDER — PROPOFOL 10 MG/ML IV BOLUS
INTRAVENOUS | Status: DC | PRN
  Administered 2024-07-02: 150 ug/kg/min via INTRAVENOUS
  Administered 2024-07-02: 100 mg via INTRAVENOUS

## 2024-07-02 MED ORDER — VASOPRESSIN 20 UNIT/ML IV SOLN
INTRAVENOUS | Status: DC | PRN
  Administered 2024-07-02: 1.5 [IU] via INTRAVENOUS

## 2024-07-02 MED ORDER — PHENYLEPHRINE 80 MCG/ML (10ML) SYRINGE FOR IV PUSH (FOR BLOOD PRESSURE SUPPORT)
PREFILLED_SYRINGE | INTRAVENOUS | Status: DC | PRN
  Administered 2024-07-02 (×5): 160 ug via INTRAVENOUS

## 2024-07-02 MED ORDER — MIDAZOLAM HCL 2 MG/2ML IJ SOLN
INTRAMUSCULAR | Status: DC | PRN
  Administered 2024-07-02: 1 mg via INTRAVENOUS

## 2024-07-02 MED ORDER — MIDAZOLAM HCL 2 MG/2ML IJ SOLN
INTRAMUSCULAR | Status: AC
  Filled 2024-07-02: qty 2

## 2024-07-02 MED ORDER — LIDOCAINE 2% (20 MG/ML) 5 ML SYRINGE
INTRAMUSCULAR | Status: DC | PRN
  Administered 2024-07-02: 60 mg via INTRAVENOUS

## 2024-07-02 MED ORDER — SODIUM CHLORIDE 0.9 % IV SOLN: INTRAVENOUS | Status: DC

## 2024-07-02 NOTE — Anesthesia Preprocedure Evaluation (Signed)
 Anesthesia Evaluation  Patient identified by MRN, date of birth, ID band Patient awake    Reviewed: Allergy & Precautions, NPO status , Patient's Chart, lab work & pertinent test results  History of Anesthesia Complications Negative for: history of anesthetic complications  Airway Mallampati: II  TM Distance: >3 FB Neck ROM: Full    Dental  (+) Dental Advisory Given, Edentulous Upper, Edentulous Lower   Pulmonary shortness of breath, neg COPD, former smoker   breath sounds clear to auscultation       Cardiovascular hypertension, + angina  + CAD, + Past MI, +CHF and + DOE   Rhythm:Irregular  1. 27 mm Watchman FLX is LAA. No device leak or device-related thrombus.  Left atrial size was severely dilated. No left atrial/left atrial  appendage thrombus was detected.   2. Left ventricular ejection fraction, by estimation, is 60 to 65%. The  left ventricle has normal function. The left ventricle has no regional  wall motion abnormalities.   3. Right ventricular systolic function is normal. The right ventricular  size is normal.   4. Right atrial size was mild to moderately dilated.   5. The mitral valve is grossly normal. Mild to moderate mitral valve  regurgitation. No evidence of mitral stenosis.   6. The aortic valve is tricuspid. Aortic valve regurgitation is trivial.  No aortic stenosis is present.   7. There is mild (Grade II) protruding plaque involving the descending  aorta and aortic arch.   8. 3D performed of the LAA and demonstrates 3D LAA assessment of Watchman  device. Normal.      Neuro/Psych  PSYCHIATRIC DISORDERS Anxiety      Neuromuscular disease    GI/Hepatic   Endo/Other  diabetes    Renal/GU CRFRenal diseaseNephrostomy tube  Lab Results      Component                Value               Date                      NA                       136                 07/02/2024                K                         3.7                 07/02/2024                CO2                      20 (L)              07/02/2024                GLUCOSE                  95                  07/02/2024                BUN  36 (H)              07/02/2024                CREATININE               2.02 (H)            07/02/2024                CALCIUM                   8.1 (L)             07/02/2024                GFR                      24.53 (L)           05/19/2024                EGFR                     42 (L)              06/27/2024                GFRNONAA                 33 (L)              07/02/2024                Musculoskeletal  (+) Arthritis ,    Abdominal   Peds  Hematology  (+) Blood dyscrasia, anemia Lab Results      Component                Value               Date                      WBC                      8.6                 07/02/2024                HGB                      10.6 (L)            07/02/2024                HCT                      33.7 (L)            07/02/2024                MCV                      73.9 (L)            07/02/2024                PLT                      204                 07/02/2024  Anesthesia Other Findings   Reproductive/Obstetrics                              Anesthesia Physical Anesthesia Plan  ASA: 3  Anesthesia Plan: MAC   Post-op Pain Management: Minimal or no pain anticipated   Induction: Intravenous  PONV Risk Score and Plan: 1 and Propofol  infusion and Treatment may vary due to age or medical condition  Airway Management Planned: Nasal Cannula, Natural Airway and Simple Face Mask  Additional Equipment: None  Intra-op Plan:   Post-operative Plan:   Informed Consent: I have reviewed the patients History and Physical, chart, labs and discussed the procedure including the risks, benefits and alternatives for the proposed anesthesia with the patient or authorized representative who  has indicated his/her understanding and acceptance.     Dental advisory given  Plan Discussed with: CRNA  Anesthesia Plan Comments:          Anesthesia Quick Evaluation

## 2024-07-02 NOTE — Consult Note (Signed)
 Palliative Care Consult Note                                  Date: 07/02/2024   Patient Name: Adam Harris  DOB: 09-14-1946  MRN: [id number]  Age / Sex:78 y.o., male  PCP: [name], MD Referring Physician: Cherlyn Labella, MD  Reason for Consultation: Establishing goals of care  Past Medical History:  Diagnosis Date   Abnormal radiologic findings on diagnostic imaging of renal pelvis, ureter, or bladder    bilateral ureter abnormalities   Anticoagulant long-term use    eliquis    Anxiety    pt denies   Arthritis    Atrial fibrillation, chronic (HCC)    CAD (coronary artery disease) cardiologist-- dr hochrein   NSTEMI 02-04-2014  per cardiac cath chronic occluded RCA w/ faint left-to-right collaterals and aneurysmal LCFx with sluggish coronary flow/   NSTEMI --11-21-2016 per cardiac cath occluded proximal RCA & mid to diastal CFX 100%, med rx. If that does not work, PTCA or CABG   Cancer [facility])    bladder and kidney   CHF (congestive heart failure) (HCC)    CKD (chronic kidney disease), stage III (HCC)    patient unaware   DOE (dyspnea on exertion)    Fatty liver    pt denies   Hematuria 02/2019   History of COVID-19 10/2019   History of non-ST elevation myocardial infarction (NSTEMI)    02-04-2014  and 11-21-2016  cardiac cath done both times ,  medically management   History of shingles 12/2017   slight pain and numbness still noted in the area   Hyperlipidemia    Hypertension    Insomnia    Myocardial infarction (HCC) 2015   Persistent atrial fibrillation [facility])    cardiologsit-- dr hochrein   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 Kennyth   Thoracic aortic atherosclerosis (HCC)    Type 2 diabetes mellitus (HCC)    Urinary frequency     Subjective:  78 yo male with metastatic urothelial carcinoma s/p nephrostomy tube placement, atrial fibrillation,  CAD, CKD IV with urosepsis. Patient received first round of chemotherapy on 8/13 and presented on 8/15 for evaluation of a fall and confusion.   Reviewed medical records, received report from team, assessed the patient and then meet at the patient's bedside to discuss diagnosis, prognosis, GOC, EOL wishes disposition and options.  Before meeting with the patient I spent time reviewing the chart notes. I also reviewed vital signs, nursing flowsheets, medication administrations record, labs, and imaging. Labs/imaging reviewed include CBC, CMP, ECHO.   I met with patient at bedside  and then spoke with son Josefine) and wife (susan) on the phone.   We meet to discuss diagnosis prognosis, GOC, EOL wishes, disposition and options. Concept of Palliative Care was introduced as specialized medical care for people and their families living with serious illness.  If focuses on providing relief from the symptoms and stress of a serious illness.  The goal is to improve quality of life for both the patient and the family. Values and goals of care important to patient and family were attempted to be elicited.  Created space and opportunity for patient  and family to explore thoughts and feelings regarding current medical situation   Natural trajectory and current clinical status were discussed. Questions and concerns addressed. Patient  encouraged to call with questions or concerns.    Patient/Family Understanding of Illness: - Patient and family all have a good understanding of the trajectory of the patient and are aware of his wishes that he would like to at least pursue a DNR, and possible comfort care, but understandably still grappling with the decision since Devere would like to pursue all possible care to prolong his including CPR/intubation  Life Review: - Wife Isidoro) of 49 years, 3 children Josefine, Garnette Moats) - Retired Runner, broadcasting/film/video and football coach for [employer] - Does report home  stress with Garnette as patient reports concerning behaviors - Reports that his home and financial situation are set so that his wife does not have to worry about it after he passes  Patient Values: - Patient expresses desire to be DNR, but wife has been resistant to the idea but fully aware of his wishes - Patient desires quality of life over quantity - Wife Charistopher Rumble) is HCPOA  Baseline Status: - Fully alert and oriented to situation - Assistance with ADLs due to fatigue, weakness, pain - Daughter Teacher, English as a foreign language) and son Wadie) lives with him and wife helps in his care  Goals: - Good pain management - Social help with Garnette for after he is gone  Review of Systems  All other systems reviewed and are negative.   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  HENT:     Head: Normocephalic and atraumatic.     Nose: Nose normal.     Mouth/Throat:     Mouth: Mucous membranes are dry.  Eyes:     Extraocular Movements: Extraocular movements intact.  Pulmonary:     Effort: Pulmonary effort is normal.  Neurological:     General: No focal deficit present.     Mental Status: He is alert and oriented to person, place, and time. Mental status is at baseline.  Psychiatric:        Mood and Affect: Mood normal.        Behavior: Behavior normal.         Thought Content: Thought content normal.        Judgment: Judgment normal.     Palliative Assessment/Data: 60%   Advanced Care Planning:   Existing Vynca/ACP Documentation: Goals of care documentation completed with outpatient NP, reports living will which will need to be brought in  Primary Decision Maker: PATIENT  Pertinent diagnosis: Metastatic urothelial cancer with poor prognosis per oncology team   Assessment & Plan:   HPI/Patient Profile: 78 y.o. male  with past medical history of metastatic urothelial cancer, HTN, CKD IV, prostate CA admitted on 06/27/2024 with fall 2/2 urosepsis.   SUMMARY OF RECOMMENDATIONS   Continue goals of care discussion with family, meeting with son Josefine) and wife Isidoro)  scheduled for 07/03/2024 at 1600  Symptom Management:  Oxycodone  5-10 mg PRN   Code Status: Full Code  Prognosis:  Unable to determine  Discharge Planning:  To Be Determined   Discussed with: Attending MD Cherlyn  Thank you for allowing us  to participate in the care of Ishmail Mcmanamon PMT will continue to support holistically.  Time Total: 75 minutes  Detailed review of medical records (labs, imaging, vital signs), medically appropriate exam, discussed with treatment team, counseling and education to patient, family, & staff, documenting clinical information, medication management, coordination of care.  Signed by: Fairy FORBES Shan DEVONNA Palliative Medicine Team  Team Phone # [phone] (Nights/Weekends)  07/02/2024, 1:01 PM

## 2024-07-02 NOTE — Plan of Care (Signed)
 Problem: Education: Goal: Knowledge of General Education information will improve Description: Including pain rating scale, medication(s)/side effects and non-pharmacologic comfort measures 07/02/2024 0727 by Billee Marybelle Ragena SHAUNNA, RN Outcome: Progressing 07/02/2024 0727 by Billee Marybelle Ragena SHAUNNA, RN Outcome: Progressing   Problem: Health Behavior/Discharge Planning: Goal: Ability to manage health-related needs will improve 07/02/2024 0727 by Billee Marybelle Ragena SHAUNNA, RN Outcome: Progressing 07/02/2024 0727 by Billee Marybelle Ragena SHAUNNA, RN Outcome: Progressing   Problem: Clinical Measurements: Goal: Ability to maintain clinical measurements within normal limits will improve 07/02/2024 0727 by Billee Marybelle Ragena SHAUNNA, RN Outcome: Progressing 07/02/2024 0727 by Billee Marybelle Ragena SHAUNNA, RN Outcome: Progressing Goal: Will remain free from infection 07/02/2024 0727 by Billee Marybelle Ragena SHAUNNA, RN Outcome: Progressing 07/02/2024 0727 by Billee Marybelle Ragena SHAUNNA, RN Outcome: Progressing Goal: Diagnostic test results will improve 07/02/2024 0727 by Billee Marybelle Ragena SHAUNNA, RN Outcome: Progressing 07/02/2024 0727 by Billee Marybelle Ragena SHAUNNA, RN Outcome: Progressing Goal: Respiratory complications will improve 07/02/2024 0727 by Billee Marybelle Ragena SHAUNNA, RN Outcome: Progressing 07/02/2024 0727 by Billee Marybelle Ragena SHAUNNA, RN Outcome: Progressing Goal: Cardiovascular complication will be avoided 07/02/2024 0727 by Billee Marybelle Ragena SHAUNNA, RN Outcome: Progressing 07/02/2024 0727 by Billee Marybelle Ragena SHAUNNA, RN Outcome: Progressing   Problem: Activity: Goal: Risk for activity intolerance will decrease 07/02/2024 0727 by Billee Marybelle Ragena SHAUNNA, RN Outcome: Progressing 07/02/2024 0727 by Billee Marybelle Ragena SHAUNNA, RN Outcome: Progressing   Problem: Nutrition: Goal: Adequate nutrition will be maintained 07/02/2024 0727 by Billee Marybelle Ragena SHAUNNA, RN Outcome:  Progressing 07/02/2024 0727 by Billee Marybelle Ragena SHAUNNA, RN Outcome: Progressing   Problem: Coping: Goal: Level of anxiety will decrease 07/02/2024 0727 by Billee Marybelle Ragena SHAUNNA, RN Outcome: Progressing 07/02/2024 0727 by Billee Marybelle Ragena SHAUNNA, RN Outcome: Progressing   Problem: Elimination: Goal: Will not experience complications related to bowel motility 07/02/2024 0727 by Billee Marybelle Ragena SHAUNNA, RN Outcome: Progressing 07/02/2024 0727 by Billee Marybelle Ragena SHAUNNA, RN Outcome: Progressing Goal: Will not experience complications related to urinary retention 07/02/2024 0727 by Billee Marybelle Ragena SHAUNNA, RN Outcome: Progressing 07/02/2024 0727 by Billee Marybelle Ragena SHAUNNA, RN Outcome: Progressing   Problem: Pain Managment: Goal: General experience of comfort will improve and/or be controlled 07/02/2024 0727 by Billee Marybelle Ragena SHAUNNA, RN Outcome: Progressing 07/02/2024 0727 by Billee Marybelle Ragena SHAUNNA, RN Outcome: Progressing   Problem: Safety: Goal: Ability to remain free from injury will improve 07/02/2024 0727 by Billee Marybelle Ragena SHAUNNA, RN Outcome: Progressing 07/02/2024 0727 by Billee Marybelle Ragena SHAUNNA, RN Outcome: Progressing   Problem: Skin Integrity: Goal: Risk for impaired skin integrity will decrease 07/02/2024 0727 by Billee Marybelle Ragena SHAUNNA, RN Outcome: Progressing 07/02/2024 0727 by Billee Marybelle Ragena SHAUNNA, RN Outcome: Progressing   Problem: Education: Goal: Ability to describe self-care measures that may prevent or decrease complications (Diabetes Survival Skills Education) will improve 07/02/2024 0727 by Billee Marybelle Ragena SHAUNNA, RN Outcome: Progressing 07/02/2024 0727 by Billee Marybelle Ragena SHAUNNA, RN Outcome: Progressing Goal: Individualized Educational Video(s) 07/02/2024 0727 by Billee Marybelle Ragena SHAUNNA, RN Outcome: Progressing 07/02/2024 0727 by Billee Marybelle Ragena SHAUNNA, RN Outcome: Progressing   Problem: Coping: Goal: Ability to  adjust to condition or change in health will improve 07/02/2024 0727 by Billee Marybelle Ragena SHAUNNA, RN Outcome: Progressing 07/02/2024 0727 by Billee Marybelle Ragena SHAUNNA, RN Outcome: Progressing   Problem: Fluid Volume: Goal: Ability to maintain a balanced intake and output will improve 07/02/2024 0727 by Billee Marybelle Ragena SHAUNNA, RN Outcome: Progressing 07/02/2024 [phone]  by Billee Marybelle Ragena SHAUNNA, RN Outcome: Progressing   Problem: Health Behavior/Discharge Planning: Goal: Ability to identify and utilize available resources and services will improve 07/02/2024 0727 by Billee Marybelle Ragena SHAUNNA, RN Outcome: Progressing 07/02/2024 0727 by Billee Marybelle Ragena SHAUNNA, RN Outcome: Progressing Goal: Ability to manage health-related needs will improve 07/02/2024 0727 by Billee Marybelle Ragena SHAUNNA, RN Outcome: Progressing 07/02/2024 0727 by Billee Marybelle Ragena SHAUNNA, RN Outcome: Progressing   Problem: Metabolic: Goal: Ability to maintain appropriate glucose levels will improve 07/02/2024 0727 by Billee Marybelle Ragena SHAUNNA, RN Outcome: Progressing 07/02/2024 0727 by Billee Marybelle Ragena SHAUNNA, RN Outcome: Progressing   Problem: Nutritional: Goal: Maintenance of adequate nutrition will improve 07/02/2024 0727 by Billee Marybelle Ragena SHAUNNA, RN Outcome: Progressing 07/02/2024 0727 by Billee Marybelle Ragena SHAUNNA, RN Outcome: Progressing Goal: Progress toward achieving an optimal weight will improve 07/02/2024 0727 by Billee Marybelle Ragena SHAUNNA, RN Outcome: Progressing 07/02/2024 0727 by Billee Marybelle Ragena SHAUNNA, RN Outcome: Progressing   Problem: Skin Integrity: Goal: Risk for impaired skin integrity will decrease 07/02/2024 0727 by Billee Marybelle Ragena SHAUNNA, RN Outcome: Progressing 07/02/2024 0727 by Billee Marybelle Ragena SHAUNNA, RN Outcome: Progressing   Problem: Tissue Perfusion: Goal: Adequacy of tissue perfusion will improve 07/02/2024 0727 by Billee Marybelle Ragena SHAUNNA, RN Outcome:  Progressing 07/02/2024 0727 by Billee Marybelle Ragena SHAUNNA, RN Outcome: Progressing

## 2024-07-02 NOTE — Op Note (Signed)
 INDICATIONS: endocarditis  PROCEDURE:   Informed consent was obtained prior to the procedure. The risks, benefits and alternatives for the procedure were discussed and the patient comprehended these risks.  Risks include, but are not limited to, cough, sore throat, vomiting, nausea, somnolence, esophageal and stomach trauma or perforation, bleeding, low blood pressure, aspiration, pneumonia, infection, trauma to the teeth and death.    After a procedural time-out, the oropharynx was anesthetized with 20% benzocaine spray.   During this procedure the patient was administered IV propofol  by Anesthesiology, Dr. Leopoldo.  The transesophageal probe was inserted in the esophagus and stomach with considerable difficulty and direct laryngoscope visualization and the pediatric probe were used for intubation, courtesy of Dr. Leopoldo. Subsequently,  multiple views were obtained.  The patient was kept under observation until the patient left the procedure room.  The patient left the procedure room in stable condition.   Agitated microbubble saline contrast was not administered.  COMPLICATIONS:    There were no immediate complications.  FINDINGS:  Normal LV and RV function. Well seated LA appendage Watchman device without leak. No vegetations are seen. At least moderate to severe TR. Moderate pulmonary artery HTN.   RECOMMENDATIONS:   No evidence for infective endocarditis.  Time Spent Directly with the Patient:  45 minutes   Adam Harris 07/02/2024, 1:07 PM

## 2024-07-02 NOTE — Progress Notes (Signed)
 [facility]                                                                               Adam Harris, is a 78 y.o. male, DOB - Jan 12, 1946, FMW:[id number] Admit date - 06/27/2024    Outpatient Primary MD for the patient is Rudd, Garnette HERO, MD  LOS - 4  days    Brief summary   Adam Harris is a 78 y.o. 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   Assessment & Plan    Assessment and Plan:  Sepsis secondary to MRSA bacteremia and Enterococcus faecalis infection from complicated urinary tract infection ID on board and managing antibiotics.  Patient afebrile and improving leukocytosis TEE scheduled today.   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   Hypertension Blood pressure parameters appear to be optimal Patient currently on 15 mg of Imdur , 25 mg of Lopressor  3 times daily  Type 2 diabetes mellitus   Recent Labs    07/02/24 0410 07/02/24 0837 07/02/24 1141  GLUCAP 84 107* 106*  Continue with sliding scale insulin    Microcytic anemia Hemoglobin around 10.6 today continue to monitor  Heart failure with preserved left ventricular ejection fraction Last echo showed left ventricular ejection fraction of 60 to 65%.  Permanent atrial fibrillation Not on anticoagulation.  Rate controlled with metoprolol   Acute kidney injury with metabolic acidosis secondary to sepsis Monitor creatinine.   Estimated body mass index is 26.9 kg/m as calculated from the following:   Height as of this encounter: 5' 11  (1.803 m).   Weight as of this encounter: 87.5 kg.  Code Status: full code. DVT Prophylaxis:  enoxaparin  (LOVENOX ) injection 40 mg Start: 06/28/24 1000   Level of Care: Level of care: Telemetry Medical Family Communication: none at bedside.  Disposition Plan:     Remains inpatient appropriate:    Procedures:    Consultants:     Antimicrobials:   Anti-infectives (From admission, onward)    Start     Dose/Rate Route Frequency Ordered Stop   07/01/24 1400  DAPTOmycin  (CUBICIN ) IVPB 700 mg/100mL premix  Status:  Discontinued        700 mg 200 mL/hr over 30 Minutes Intravenous Every 48 hours 06/30/24 1445 07/01/24 0955   07/01/24 1045  DAPTOmycin  (CUBICIN ) IVPB 700 mg/100mL premix  Status:  Discontinued        700 mg 200 mL/hr over 30 Minutes Intravenous Daily 07/01/24 0955 07/01/24 1028   07/01/24 1045  DAPTOmycin  (CUBICIN ) 900 mg in sodium chloride  0.9 % IVPB        900 mg 136 mL/hr over 30 Minutes Intravenous Daily 07/01/24 1028     06/29/24 2000  vancomycin  (VANCOCIN ) IVPB 1000 mg/200 mL premix  Status:  Discontinued        1,000  mg 200 mL/hr over 60 Minutes Intravenous Every 24 hours 06/28/24 1922 06/29/24 1543   06/29/24 1700  cefTRIAXone  (ROCEPHIN ) 1 g in sodium chloride  0.9 % 100 mL IVPB  Status:  Discontinued        1 g 200 mL/hr over 30 Minutes Intravenous Every 24 hours 06/29/24 1551 06/30/24 1154   06/29/24 1700  DAPTOmycin  (CUBICIN ) IVPB 700 mg/100mL premix  Status:  Discontinued        700 mg 200 mL/hr over 30 Minutes Intravenous Daily 06/29/24 1552 06/30/24 1445   06/28/24 2015  vancomycin  (VANCOREADY) IVPB 2000 mg/400 mL        2,000 mg 200 mL/hr over 120 Minutes Intravenous  Once 06/28/24 1922 06/29/24 0903   06/28/24 0845  piperacillin -tazobactam (ZOSYN ) IVPB 3.375 g  Status:  Discontinued        3.375 g 12.5 mL/hr over 240 Minutes Intravenous Every 8 hours 06/28/24 0844 06/29/24 1543   06/28/24 0200  piperacillin -tazobactam (ZOSYN ) IVPB 3.375 g         3.375 g 100 mL/hr over 30 Minutes Intravenous  Once 06/28/24 0154 06/28/24 0345        Medications  Scheduled Meds:  artificial tears  2 drop Both Eyes QID   aspirin  EC  81 mg Oral Daily   enoxaparin  (LOVENOX ) injection  40 mg Subcutaneous Q24H   feeding supplement  237 mL Oral BID BM   Gerhardt's butt cream   Topical BID   insulin  aspart  0-9 Units Subcutaneous Q4H   isosorbide  mononitrate  15 mg Oral Daily   linaclotide   145 mcg Oral QAC breakfast   metoprolol  tartrate  25 mg Oral TID   mirtazapine   15 mg Oral QHS   sodium bicarbonate   650 mg Oral BID   sodium chloride  flush  3 mL Intravenous Q12H   tamsulosin   0.4 mg Oral Daily   Continuous Infusions:  sodium chloride      DAPTOmycin  Stopped (07/01/24 1227)   PRN Meds:.acetaminophen  **OR** acetaminophen , albuterol , metoprolol  tartrate, ondansetron  **OR** ondansetron  (ZOFRAN ) IV, oxyCODONE , polyethylene glycol    Subjective:   Adam Harris was seen and examined today.    Objective:   Vitals:   07/02/24 0413 07/02/24 0721 07/02/24 0756 07/02/24 1200  BP: 139/80  (!) 153/97 (!) 168/119  Pulse: 92  (!) 106 (!) 123  Resp: 18  18 (!) 31  Temp: 98 F (36.7 C)  98.2 F (36.8 C) 97.8 F (36.6 C)  TempSrc: Oral   Temporal  SpO2: 96%  93% 95%  Weight:  87.5 kg    Height:        Intake/Output Summary (Last 24 hours) at 07/02/2024 1236 Last data filed at 07/02/2024 1100 Gross per 24 hour  Intake 308 ml  Output 1550 ml  Net -1242 ml   Filed Weights   06/30/24 0500 07/01/24 0500 07/02/24 0721  Weight: 90 kg 91.5 kg 87.5 kg     Exam General exam: Appears calm and comfortable  Respiratory system: Clear to auscultation. Respiratory effort normal. Cardiovascular system: S1 & S2 heard, RRR.  Gastrointestinal system: Abdomen is nondistended, soft and nontender.  Central nervous system: Alert and oriented.  Extremities: Symmetric 5 x 5 power. Skin: No rashes,  Psychiatry:  Mood & affect appropriate.     Data  Reviewed:  I have personally reviewed following labs and imaging studies   CBC Lab Results  Component Value Date   WBC 8.6 07/02/2024   RBC 4.56 07/02/2024   HGB 10.6 (L)  07/02/2024   HCT 33.7 (L) 07/02/2024   MCV 73.9 (L) 07/02/2024   MCH 23.2 (L) 07/02/2024   PLT 204 07/02/2024   MCHC 31.5 07/02/2024   RDW 20.8 (H) 07/02/2024   LYMPHSABS 0.7 07/02/2024   MONOABS 0.6 07/02/2024   EOSABS 0.1 07/02/2024   BASOSABS 0.1 07/02/2024     Last metabolic panel Lab Results  Component Value Date   NA 136 07/02/2024   K 3.7 07/02/2024   CL 107 07/02/2024   CO2 20 (L) 07/02/2024   BUN 36 (H) 07/02/2024   CREATININE 2.02 (H) 07/02/2024   GLUCOSE 95 07/02/2024   GFRNONAA 33 (L) 07/02/2024   GFRAA 32 (L) 08/05/2020   CALCIUM  8.1 (L) 07/02/2024   PHOS 4.2 08/06/2023   PROT 7.0 07/01/2024   ALBUMIN  1.8 (L) 07/01/2024   BILITOT 1.3 (H) 07/01/2024   ALKPHOS 130 (H) 07/01/2024   AST 41 07/01/2024   ALT 23 07/01/2024   ANIONGAP 9 07/02/2024    CBG (last 3)  Recent Labs    07/02/24 0410 07/02/24 0837 07/02/24 1141  GLUCAP 84 107* 106*      Coagulation Profile: Recent Labs  Lab 06/28/24 0111  INR 1.5*     Radiology Studies: ECHOCARDIOGRAM COMPLETE BUBBLE STUDY Result Date: 06/30/2024    ECHOCARDIOGRAM REPORT   Patient Name:   TAITEN BRAWN  Date of Exam: 06/30/2024 Medical Rec #:  [id number]  Height:       71.0 in Accession #:    [id number] Weight:       198.4 lb Date of Birth:  1946/05/13   BSA:          2.101 m Patient Age:    78 years   BP:           137/95 mmHg Patient Gender: M          HR:           112 bpm. Exam Location:  Inpatient Procedure: 2D Echo, Cardiac Doppler and Color Doppler (Both Spectral and Color            Flow Doppler were utilized during procedure). Indications:    Bacteremia [790.7.ICD-9-CM]  History:        Patient has prior history of Echocardiogram examinations, most                 recent 06/12/2022. Risk Factors:Hypertension.  Sonographer:    Benard Stallion Referring Phys: [id number] GREGORY D CALONE IMPRESSIONS  1. Left ventricular ejection fraction, by estimation, is 60 to 65%. The left ventricle has normal function. The left ventricle has no regional wall motion abnormalities. There is moderate asymmetric left ventricular hypertrophy of the basal-septal segment. Left ventricular diastolic parameters are indeterminate.  2. Right ventricular systolic function is normal. The right ventricular size is normal. There is severely elevated pulmonary artery systolic pressure. The estimated right ventricular systolic pressure is 77.4 mmHg.  3. Left atrial size was moderately dilated.  4. Right atrial size was mildly dilated.  5. Negative bubble study, no evidence for PFO or ASD.  6. The mitral valve is normal in structure. Trivial mitral valve regurgitation. No evidence of mitral stenosis.  7. Tricuspid valve regurgitation is moderate.  8. The aortic valve is tricuspid. There is mild calcification of the aortic valve. Aortic valve regurgitation is not visualized. No aortic stenosis is present.  9. Aortic dilatation noted. There is borderline dilatation of the ascending aorta, measuring 38 mm. 10. The inferior vena cava is dilated  in size with <50% respiratory variability, suggesting right atrial pressure of 15 mmHg. 11. The patient was in atrial fibrillation. 12. No valvular vegetation was visualized. FINDINGS  Left Ventricle: Left ventricular ejection fraction, by estimation, is 60 to 65%. The left ventricle has normal function. The left ventricle has no regional wall motion abnormalities. The left ventricular internal cavity size was normal in size. There is  moderate asymmetric left ventricular hypertrophy of the basal-septal segment. Left ventricular diastolic parameters are indeterminate. Right Ventricle: The right ventricular size is normal. No increase in right ventricular wall thickness. Right ventricular systolic function is normal. There is severely elevated  pulmonary artery systolic pressure. The tricuspid regurgitant velocity is 3.95 m/s, and with an assumed right atrial pressure of 15 mmHg, the estimated right ventricular systolic pressure is 77.4 mmHg. Left Atrium: Left atrial size was moderately dilated. Right Atrium: Right atrial size was mildly dilated. Pericardium: There is no evidence of pericardial effusion. Mitral Valve: The mitral valve is normal in structure. Trivial mitral valve regurgitation. No evidence of mitral valve stenosis. Tricuspid Valve: The tricuspid valve is normal in structure. Tricuspid valve regurgitation is moderate. Aortic Valve: The aortic valve is tricuspid. There is mild calcification of the aortic valve. Aortic valve regurgitation is not visualized. No aortic stenosis is present. Aortic valve mean gradient measures 2.0 mmHg. Aortic valve peak gradient measures 3.6 mmHg. Aortic valve area, by VTI measures 3.63 cm. Pulmonic Valve: The pulmonic valve was normal in structure. Pulmonic valve regurgitation is trivial. Aorta: The aortic root is normal in size and structure and aortic dilatation noted. There is borderline dilatation of the ascending aorta, measuring 38 mm. Venous: The inferior vena cava is dilated in size with less than 50% respiratory variability, suggesting right atrial pressure of 15 mmHg. IAS/Shunts: Negative bubble study, no evidence for PFO or ASD.  LEFT VENTRICLE PLAX 2D LVIDd:         2.50 cm   Diastology LVIDs:         2.10 cm   LV e' medial:    6.85 cm/s LV PW:         1.20 cm   LV E/e' medial:  11.2 LV IVS:        1.20 cm   LV e' lateral:   8.81 cm/s LVOT diam:     2.20 cm   LV E/e' lateral: 8.7 LV SV:         57 LV SV Index:   27 LVOT Area:     3.80 cm  RIGHT VENTRICLE RV Basal diam:  3.50 cm RV Mid diam:    2.70 cm RV S prime:     9.61 cm/s TAPSE (M-mode): 1.6 cm LEFT ATRIUM             Index        RIGHT ATRIUM           Index LA diam:        3.90 cm 1.86 cm/m   RA Area:     22.00 cm LA Vol (A2C):   95.8 ml  45.59 ml/m  RA Volume:   58.60 ml  27.89 ml/m LA Vol (A4C):   81.3 ml 38.69 ml/m LA Biplane Vol: 91.9 ml 43.73 ml/m  AORTIC VALVE AV Area (Vmax):    3.45 cm AV Area (Vmean):   3.11 cm AV Area (VTI):     3.63 cm AV Vmax:           95.30 cm/s AV Vmean:  68.600 cm/s AV VTI:            0.156 m AV Peak Grad:      3.6 mmHg AV Mean Grad:      2.0 mmHg LVOT Vmax:         86.60 cm/s LVOT Vmean:        56.200 cm/s LVOT VTI:          0.149 m LVOT/AV VTI ratio: 0.96  AORTA Ao Root diam: 3.10 cm Ao Asc diam:  3.80 cm MITRAL VALVE               TRICUSPID VALVE MV Area (PHT): 6.96 cm    TR Peak grad:   62.4 mmHg MV Decel Time: 109 msec    TR Vmax:        395.00 cm/s MV E velocity: 76.40 cm/s MV A velocity: 27.10 cm/s  SHUNTS MV E/A ratio:  2.82        Systemic VTI:  0.15 m                            Systemic Diam: 2.20 cm Dalton McleanMD Electronically signed by Ezra Kanner Signature Date/Time: 06/30/2024/9:06:21 PM    Final        Elgie Butter M.D. [facility] 07/02/2024, 12:36 PM  Available via Epic secure chat 7am-7pm After 7 pm, please refer to night coverage provider listed on amion.

## 2024-07-02 NOTE — Plan of Care (Signed)

## 2024-07-02 NOTE — Progress Notes (Signed)
  Echocardiogram Echocardiogram Transesophageal has been performed.  Koleen KANDICE Popper, RDCS 07/02/2024, 1:14 PM

## 2024-07-02 NOTE — Interval H&P Note (Deleted)
 History and Physical Interval Note:  07/02/2024 8:15 AM  Adam Harris  has presented today for surgery, with the diagnosis of Bacteremia.  The various methods of treatment have been discussed with the patient and family. After consideration of risks, benefits and other options for treatment, the patient has consented to  Procedure(s): TRANSESOPHAGEAL ECHOCARDIOGRAM (N/A) as a surgical intervention.  The patient's history has been reviewed, patient examined, no change in status, stable for surgery.  I have reviewed the patient's chart and labs.  Questions were answered to the patient's satisfaction.     Tyreka Henneke

## 2024-07-02 NOTE — Interval H&P Note (Signed)
 History and Physical Interval Note:  07/02/2024 8:15 AM  Adam Harris  has presented today for surgery, with the diagnosis of Bacteremia.  The various methods of treatment have been discussed with the patient and family. After consideration of risks, benefits and other options for treatment, the patient has consented to  Procedure(s): TRANSESOPHAGEAL ECHOCARDIOGRAM (N/A) as a surgical intervention.  The patient's history has been reviewed, patient examined, no change in status, stable for surgery.  I have reviewed the patient's chart and labs.  Questions were answered to the patient's satisfaction.     Tyreka Henneke

## 2024-07-02 NOTE — Progress Notes (Incomplete)
 PHARMACY CONSULT NOTE FOR:  OUTPATIENT  PARENTERAL ANTIBIOTIC THERAPY (OPAT)  Indication: MRSA/E faecalis bacteremia Regimen: Daptomycin  900 mg IV every 24 hours End date: 07/28/24 (4 weeks from neg BCx on 8/18)  IV antibiotic discharge orders are pended. To discharging provider:  please sign these orders via discharge navigator,  Select New Orders & click on the button choice - Manage This Unsigned Work.    Thank you for allowing pharmacy to be a part of this patient's care.  Almarie Lunger, PharmD, BCPS, BCIDP Infectious Diseases Clinical Pharmacist 07/03/2024 4:00 PM   **Pharmacist phone directory can now be found on [email or web address] ([email or web address]).  Listed under [facility].

## 2024-07-02 NOTE — TOC Initial Note (Addendum)
 Transition of Care [facility]) - Initial/Assessment Note    Patient Details  Name: Adam Harris MRN: [id number] Date of Birth: [birth year]  Transition of Care [facility]) CM/SW Contact:    Rosalva Jon Bloch, RN Phone Number: 07/02/2024, 11:49 AM  Clinical Narrative:                 Presents after a fall @ home. Hx of coronary artery disease, diabetes, a fib status post watchmen procedure, CHF, metastatic urothelial carcinoma,left PCN,  CKD.   Pt with polymicrobial bacteremia secondary to complicated UTI.   From home with wife and daughter. PTA independent with ADL's. Owns cane.  PTA per [facility] liaison referral was made for [facility] for pressure ulcer by PCP.     Plan: TEE scheduled for  8/20  NCM requested Pam with [facility] to follow for ? LT IV ABX therapy.  ICM following and will assist with needs....   Barriers to Discharge: Continued Medical Work up   Patient Goals and CMS Choice            Expected Discharge Plan and Services   Discharge Planning Services: CM Consult   Living arrangements for the past 2 months: Single Family Home                                      Prior Living Arrangements/Services Living arrangements for the past 2 months: Single Family Home Lives with:: Spouse, Adult Children (daughter, Powell) Patient language and need for interpreter reviewed:: Yes Do you feel safe going back to the place where you live?: Yes      Need for Family Participation in Patient Care: Yes (Comment) Care giver support system in place?: Yes (comment) Current home services: DME (cane) Criminal Activity/Legal Involvement Pertinent to Current Situation/Hospitalization: No - Comment as needed  Activities of Daily Living      Permission Sought/Granted                  Emotional Assessment       Orientation: : Oriented to Self, Oriented to Place, Oriented to  Time, Oriented to Situation Alcohol  / Substance Use: Not Applicable Psych  Involvement: No (comment)  Admission diagnosis:  Delirium [R41.0] Fall [W19.XXXA] Fever, unspecified fever cause [R50.9] Patient Active Problem List   Diagnosis Date Noted   Fall 06/28/2024   Sepsis secondary to UTI (HCC) 06/28/2024   Microcytic anemia 06/28/2024   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) 06/27/2024   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) 06/27/2024   Acute delirium 06/27/2024   Pressure injury of left buttock, stage 3 (HCC) 06/19/2024   Moderate protein-calorie malnutrition (HCC) 06/19/2024   Opioid-induced constipation 06/19/2024   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) 06/04/2024   Generalized weakness 05/25/2024   Complicated UTI (urinary tract infection) 05/25/2024   Type 2 diabetes mellitus with hyperglycemia (HCC) 05/25/2024   Nephrostomy present (HCC) 04/01/2024   AKI (acute kidney injury) (HCC) 03/08/2024   Tachyarrhythmia 03/07/2024   Acute blood loss anemia 03/07/2024   Dysuria 01/25/2024   Presence of Watchman left atrial appendage closure device 12/13/2023   Permanent atrial fibrillation (HCC) 11/29/2023   Hx of hematuria 11/29/2023   Lower urinary tract symptoms (LUTS) 04/06/2023   CKD (chronic kidney disease), stage IV (HCC) 03/22/2023   Prostate cancer (HCC) 06/21/2022   Cataract cortical, senile, left 12/13/2021   Diabetic peripheral neuropathy (HCC)  12/13/2021   Hydrocele- Left 12/02/2021   Solitary kidney, acquired 10/26/2021   Hypertensive renal disease 10/26/2021   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   Chronic diastolic CHF (congestive heart failure), NYHA class 2 (HCC) 04/02/2017   Atrial fibrillation with RVR (HCC) 11/20/2016   OA (osteoarthritis) of knee 05/31/2016   Low back pain 05/31/2016   Exertional angina (HCC) 10/29/2014   Chronic kidney disease, stage 3b (HCC) 10/29/2014   Coronary artery disease  involving native coronary artery of native heart with angina pectoris (HCC) 03/09/2014   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/2014   Hyperlipidemia 02/05/2014   History of non-ST elevation myocardial infarction (NSTEMI) 02/03/2014   Essential hypertension 02/03/2014   Anxiety state 04/19/2013   History of prostate cancer 04/19/2013   Insomnia 04/19/2013   Stiffness of joint, lower leg 04/19/2013   Sciatica 04/19/2013   PCP:  [name], MD Pharmacy:   [facility] - [place], [place] - [address] [place] [address] Phone: [phone] Fax: [phone]  [id number] - [address] [place] - [address] AT [facility] [address] & [address] [place] [address] Phone: [phone] Fax: [phone]  [facility] #[id number] - [place], [place] - [address] AT [facility] [address] [place] [address] Phone: [phone] Fax: [phone]     Social Drivers of Health (SDOH) Social History: SDOH Screenings   Food Insecurity: No Food Insecurity (06/28/2024)  Housing: Low Risk  (06/28/2024)  Transportation Needs: No Transportation Needs (06/28/2024)  Utilities: Not At Risk (06/28/2024)  Alcohol  Screen: Low Risk  (08/06/2023)  Depression (PHQ2-9): Low Risk  (06/25/2024)  Financial Resource Strain: Low Risk  (08/06/2023)  Physical Activity: Inactive (08/06/2023)  Social Connections: Moderately Integrated (06/28/2024)  Stress: No Stress Concern Present (08/06/2023)  Tobacco Use: Medium Risk (06/27/2024)  Health Literacy: Adequate Health Literacy (08/06/2023)   SDOH Interventions:     Readmission Risk Interventions    05/26/2024    3:33 PM  Readmission Risk Prevention Plan  Transportation Screening Complete  PCP or Specialist Appt within 3-5 Days Complete  HRI or Home Care Consult Complete  Social Work Consult for Recovery Care  Planning/Counseling Complete  Palliative Care Screening Not Applicable  Medication Review Oceanographer) Referral to Pharmacy

## 2024-07-02 NOTE — Transfer of Care (Signed)
 Immediate Anesthesia Transfer of Care Note  Patient: Adam Harris  Procedure(s) Performed: TRANSESOPHAGEAL ECHOCARDIOGRAM  Patient Location: PACU  Anesthesia Type:MAC  Level of Consciousness: oriented and patient cooperative  Airway & Oxygen Therapy: Patient Spontanous Breathing and Patient connected to face mask oxygen  Post-op Assessment: Report given to RN, Post -op Vital signs reviewed and stable, Patient moving all extremities X 4, and Patient able to stick tongue midline  Post vital signs: Reviewed and stable  Last Vitals:  Vitals Value Taken Time  BP 133/85 07/02/24 13:27  Temp 36.5 C 07/02/24 13:26  Pulse 90 07/02/24 13:29  Resp 22 07/02/24 13:29  SpO2 99 % 07/02/24 13:29  Vitals shown include unfiled device data.  Last Pain:  Vitals:   07/02/24 1326  TempSrc: Tympanic  PainSc: Asleep         Complications: No notable events documented.

## 2024-07-03 ENCOUNTER — Other Ambulatory Visit: Payer: Self-pay

## 2024-07-03 DIAGNOSIS — I4821 Permanent atrial fibrillation: Secondary | ICD-10-CM | POA: Diagnosis not present

## 2024-07-03 DIAGNOSIS — A419 Sepsis, unspecified organism: Secondary | ICD-10-CM | POA: Diagnosis not present

## 2024-07-03 DIAGNOSIS — I5032 Chronic diastolic (congestive) heart failure: Secondary | ICD-10-CM | POA: Diagnosis not present

## 2024-07-03 DIAGNOSIS — N10 Acute tubulo-interstitial nephritis: Secondary | ICD-10-CM

## 2024-07-03 DIAGNOSIS — N183 Chronic kidney disease, stage 3 unspecified: Secondary | ICD-10-CM

## 2024-07-03 DIAGNOSIS — D509 Iron deficiency anemia, unspecified: Secondary | ICD-10-CM | POA: Diagnosis not present

## 2024-07-03 DIAGNOSIS — B952 Enterococcus as the cause of diseases classified elsewhere: Secondary | ICD-10-CM

## 2024-07-03 LAB — GLUCOSE, CAPILLARY
Glucose-Capillary: 112 mg/dL — ABNORMAL HIGH (ref 70–99)
Glucose-Capillary: 115 mg/dL — ABNORMAL HIGH (ref 70–99)
Glucose-Capillary: 118 mg/dL — ABNORMAL HIGH (ref 70–99)
Glucose-Capillary: 153 mg/dL — ABNORMAL HIGH (ref 70–99)
Glucose-Capillary: 190 mg/dL — ABNORMAL HIGH (ref 70–99)
Glucose-Capillary: 229 mg/dL — ABNORMAL HIGH (ref 70–99)

## 2024-07-03 LAB — BASIC METABOLIC PANEL WITH GFR
Anion gap: 10 (ref 5–15)
BUN: 35 mg/dL — ABNORMAL HIGH (ref 8–23)
CO2: 21 mmol/L — ABNORMAL LOW (ref 22–32)
Calcium: 8.4 mg/dL — ABNORMAL LOW (ref 8.9–10.3)
Chloride: 107 mmol/L (ref 98–111)
Creatinine, Ser: 1.99 mg/dL — ABNORMAL HIGH (ref 0.61–1.24)
GFR, Estimated: 34 mL/min — ABNORMAL LOW (ref >60)
Glucose, Bld: 117 mg/dL — ABNORMAL HIGH (ref 70–99)
Potassium: 3.9 mmol/L (ref 3.5–5.1)
Sodium: 138 mmol/L (ref 135–145)

## 2024-07-03 LAB — CBC WITH DIFFERENTIAL/PLATELET
Abs Granulocyte: 6.3 K/uL (ref 1.5–6.5)
Abs Immature Granulocytes: 1.04 K/uL — ABNORMAL HIGH (ref 0.00–0.07)
Basophils Absolute: 0.1 K/uL (ref 0.0–0.1)
Basophils Relative: 1 %
Eosinophils Absolute: 0.1 K/uL (ref 0.0–0.5)
Eosinophils Relative: 1 %
HCT: 33.5 % — ABNORMAL LOW (ref 39.0–52.0)
Hemoglobin: 10.5 g/dL — ABNORMAL LOW (ref 13.0–17.0)
Immature Granulocytes: 12 %
Lymphocytes Relative: 10 %
Lymphs Abs: 0.9 K/uL (ref 0.7–4.0)
MCH: 23.3 pg — ABNORMAL LOW (ref 26.0–34.0)
MCHC: 31.3 g/dL (ref 30.0–36.0)
MCV: 74.3 fL — ABNORMAL LOW (ref 80.0–100.0)
Monocytes Absolute: 0.7 K/uL (ref 0.1–1.0)
Monocytes Relative: 7 %
Neutro Abs: 6.3 K/uL (ref 1.7–7.7)
Neutrophils Relative %: 69 %
Platelets: 239 K/uL (ref 150–400)
RBC: 4.51 MIL/uL (ref 4.22–5.81)
RDW: 20.9 % — ABNORMAL HIGH (ref 11.5–15.5)
WBC: 9.1 K/uL (ref 4.0–10.5)
nRBC: 0 % (ref 0.0–0.2)

## 2024-07-03 MED ORDER — HYDROXYZINE HCL 25 MG PO TABS
25.0000 mg | ORAL_TABLET | Freq: Three times a day (TID) | ORAL | Status: DC | PRN
  Administered 2024-07-03 – 2024-07-09 (×9): 25 mg via ORAL
  Filled 2024-07-03 (×9): qty 1

## 2024-07-03 MED ORDER — CALCIUM CARBONATE ANTACID 500 MG PO CHEW
1.0000 | CHEWABLE_TABLET | Freq: Three times a day (TID) | ORAL | Status: DC
  Administered 2024-07-03 – 2024-07-10 (×19): 200 mg via ORAL
  Filled 2024-07-03 (×20): qty 1

## 2024-07-03 MED ORDER — OXYCODONE HCL 5 MG PO TABS
5.0000 mg | ORAL_TABLET | Freq: Once | ORAL | Status: AC
  Administered 2024-07-04: 5 mg via ORAL
  Filled 2024-07-03: qty 1

## 2024-07-03 NOTE — Anesthesia Postprocedure Evaluation (Signed)
 Anesthesia Post Note  Patient: Adam Harris  Procedure(s) Performed: TRANSESOPHAGEAL ECHOCARDIOGRAM     Patient location during evaluation: Cath Lab Anesthesia Type: MAC Level of consciousness: patient cooperative Pain management: pain level controlled Vital Signs Assessment: post-procedure vital signs reviewed and stable Respiratory status: spontaneous breathing, nonlabored ventilation and respiratory function stable Cardiovascular status: stable and blood pressure returned to baseline Postop Assessment: no apparent nausea or vomiting Anesthetic complications: no   No notable events documented.  Last Vitals:  Vitals:   07/03/24 0515 07/03/24 0800  BP: (!) 150/112 (!) 162/107  Pulse: 91 98  Resp: 18 19  Temp: 36.9 C 36.9 C  SpO2: 93% 93%    Last Pain:  Vitals:   07/03/24 0800  TempSrc: Oral  PainSc: 0-No pain                 Rider Ermis

## 2024-07-03 NOTE — Progress Notes (Addendum)
 [facility]    Date of Admission:  06/27/2024      ID: Adam Harris is a 78 y.o. 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 Sleepy this afternoon  Medications:   artificial tears  2 drop Both Eyes QID   aspirin  EC  81 mg Oral Daily   enoxaparin  (LOVENOX ) injection  40 mg Subcutaneous Q24H   feeding supplement  237 mL Oral BID BM   Gerhardt's butt cream   Topical BID   insulin  aspart  0-9 Units Subcutaneous Q4H   isosorbide  mononitrate  15 mg Oral Daily   linaclotide   145 mcg Oral QAC breakfast   metoprolol  tartrate  25 mg Oral TID   mirtazapine   15 mg Oral QHS   sodium bicarbonate   650 mg Oral BID   sodium chloride  flush  3 mL Intravenous Q12H   tamsulosin   0.4 mg Oral Daily    Objective: Vital signs in last 24 hours: Temp:  [97.7 F (36.5 C)-98.5 F (36.9 C)] 98.4 F (36.9 C) (08/21 0800) Pulse Rate:  [83-98] 98 (08/21 0800) Resp:  [18-20] 19 (08/21 0800) BP: (136-162)/(90-112) 162/107 (08/21 0800) SpO2:  [93 %-96 %] 93 % (08/21 0800) Weight:  [87.5 kg] 87.5 kg (08/21 0708)  Physical Exam  Constitutional: He is oriented to person, place, and time. He appears well-developed and well-nourished. No distress.   HENT:  Mouth/Throat: Oropharynx is clear and moist. No oropharyngeal exudate.  Cardiovascular: Normal rate, regular rhythm and normal heart sounds. Exam reveals no gallop and no friction rub.  No murmur heard.  Pulmonary/Chest: Effort normal and breath sounds normal. No respiratory distress. He has no wheezes.  Abdominal: Soft. Bowel sounds are normal. He exhibits no distension. There is no tenderness.  Back = nephrostomy tube in place Neurological: He is alert and oriented to person, place, and time.  Skin: Skin is warm and dry. No rash noted. No erythema.  Psychiatric: He has a normal mood and affect. His behavior is normal.    Lab Results Recent Labs    07/02/24 0445 07/03/24 0443  WBC 8.6 9.1  HGB 10.6* 10.5*  HCT 33.7* 33.5*  NA 136 138  K 3.7 3.9  CL 107 107  CO2 20* 21*  BUN 36* 35*  CREATININE 2.02* 1.99*   Liver Panel Recent Labs    07/01/24 0710  PROT 7.0  ALBUMIN  1.8*  AST 41  ALT 23  ALKPHOS 130*  BILITOT 1.3*   Sedimentation Rate No results for input(s): ESRSEDRATE in the last 72 hours. C-Reactive Protein No results for input(s): CRP in the last 72 hours.  Microbiology: Blood cx ngtd 8/18 Blood cx 8/16 MRSA and enterococcus Urine cx 8/16: MRSA and enterococcus  Methicillin resistant staphylococcus aureus Enterococcus faecalis     MIC MIC   AMPICILLIN   <=2 SENSITIVE Sensitive   CIPROFLOXACIN  >=8 RESISTANT Resistant     CLINDAMYCIN <=0.25 SENS... Sensitive     ERYTHROMYCIN <=0.25 SENS... Sensitive     GENTAMICIN <=0.5 SENSI... Sensitive     GENTAMICIN SYNERGY   SENSITIVE Sensitive   Inducible Clindamycin NEGATIVE Sensitive     LINEZOLID 2 SENSITIVE Sensitive     OXACILLIN >=4 RESISTANT Resistant     RIFAMPIN <=0.5 SENSI... Sensitive     TETRACYCLINE <=1 SENSITIVE Sensitive     TRIMETH /SULFA  >=320 RESIS... Resistant     VANCOMYCIN  1 SENSITIVE Sensitive 1 SENSITIVE Sensitive     Studies/Results: ECHO TEE Result Date:  07/02/2024    TRANSESOPHOGEAL ECHO REPORT   Patient Name:   Adam Harris  Date of Exam: 07/02/2024 Medical Rec #:  [id number]  Height:       71.0 in Accession #:    [id number] Weight:       192.9 lb Date of Birth:  Oct 20, 1946   BSA:          2.076 m Patient Age:    78 years   BP:           153/97 mmHg Patient Gender: M          HR:           128 bpm. Exam Location:  Inpatient Procedure: Cardiac Doppler, Color Doppler and Transesophageal Echo (Both            Spectral and Color Flow Doppler were utilized during procedure). Indications:     Endocarditis R78.81  History:         Patient has prior history of Echocardiogram examinations, most                  recent 06/30/2024. CAD and Previous Myocardial Infarction, CKD,                  Arrythmias:Atrial Fibrillation; Risk Factors:Hypertension,                  Dyslipidemia, Diabetes and Former Smoker.  Sonographer:     Koleen Popper RDCS Referring Phys:  [id number] ZANE ADAMS Diagnosing Phys: Jerel Balding MD PROCEDURE: After discussion of the risks and benefits of a TEE, an informed consent was obtained from the patient. Pediatric probe was used. Imaged were obtained with the patient in a left lateral decubitus position. Sedation performed by different physician. The patient was monitored while under deep sedation. Anesthestetic sedation was provided intravenously by Anesthesiology: 245mg  of Propofol , 60mg  of Lidocaine . The patient developed no complications during the procedure.  IMPRESSIONS  1. Left ventricular ejection fraction, by estimation, is 65 to 70%. The left ventricle has normal function. The left ventricle has no regional wall motion abnormalities. There is moderate concentric left ventricular hypertrophy. Left ventricular diastolic function could not be evaluated.  2. Right ventricular systolic function is normal. The right ventricular size is normal. There is severely elevated pulmonary artery systolic pressure. The estimated right ventricular systolic pressure  is 79.0 mmHg.  3. Well seated left atrial appendage occluder (Watchman) device without leak, thrombus or vegetation. Left atrial size was severely dilated. No left atrial/left atrial appendage thrombus was detected.  4. Right atrial size was mildly dilated.  5. The mitral valve is normal in structure. Trivial mitral valve regurgitation. No evidence of mitral stenosis.  6. Tricuspid valve regurgitation is moderate to severe.  7.  The aortic valve is tricuspid. There is mild calcification of the aortic valve. There is mild thickening of the aortic valve. Aortic valve regurgitation is trivial. Aortic valve sclerosis is present, with no evidence of aortic valve stenosis.  8. There is mild (Grade II) plaque involving the aortic arch and descending aorta. Conclusion(s)/Recommendation(s): No evidence of vegetation/infective endocarditis on this transesophageael echocardiogram. FINDINGS  Left Ventricle: Left ventricular ejection fraction, by estimation, is 65 to 70%. The left ventricle has normal function. The left ventricle has no regional wall motion abnormalities. The left ventricular internal cavity size was normal in size. There is  moderate concentric left ventricular hypertrophy. Left ventricular diastolic function could not be evaluated due to atrial fibrillation. Left ventricular diastolic function could not be evaluated. Right Ventricle: The right ventricular size is normal. No increase in right ventricular wall thickness. Right ventricular systolic function is normal. There is severely elevated pulmonary artery systolic pressure. The tricuspid regurgitant velocity is 4.00 m/s, and with an assumed right atrial pressure of 15 mmHg, the estimated right ventricular systolic pressure is 79.0 mmHg. Left Atrium: Well seated left atrial appendage occluder (Watchman) device without leak, thrombus or vegetation. Left atrial size was severely dilated. Spontaneous echo contrast was present in the left atrium. No left  atrial/left atrial appendage thrombus  was detected. Right Atrium: Right atrial size was mildly dilated. Pericardium: There is no evidence of pericardial effusion. Mitral Valve: The mitral valve is normal in structure. Trivial mitral valve regurgitation. No evidence of mitral valve stenosis. Tricuspid Valve: The tricuspid valve is grossly normal. Tricuspid valve regurgitation is moderate to severe. Aortic Valve: The aortic valve is tricuspid. There is mild calcification of the aortic valve. There is mild thickening of the aortic valve. Aortic valve regurgitation is trivial. Aortic valve sclerosis is present, with no evidence of aortic valve stenosis. Pulmonic Valve: The pulmonic valve was normal in structure. Pulmonic valve regurgitation is not visualized. Aorta: The aortic root, ascending aorta, aortic arch and descending aorta are all structurally normal, with no evidence of dilitation or obstruction. There is mild (Grade II) plaque involving the aortic arch and descending aorta. IAS/Shunts: The interatrial septum appears to be lipomatous. No atrial level shunt detected by color flow Doppler. Additional Comments: Spectral Doppler performed. TRICUSPID VALVE TR Peak grad:   64.0 mmHg TR Vmax:        400.00 cm/s Jerel Balding MD Electronically signed by Jerel Balding MD Signature Date/Time: 07/02/2024/1:40:21 PM    Final    EP STUDY Result Date: 07/02/2024 See surgical note for result.    Assessment/Plan: Complicated bacteremia with mrsa and e.faecalis secondary to pyelonephritis s/p new nephrostomy tube in setting of sepsis. Sepsis resolved. Patient on daptomycin .  Plan to treat for 4 wk since blood cx NGTD  - plan to place on suppression with doxycycline and amoxicillin since his nephrostomy tubes are retained. And can stop after new tubes are replaced Q2-58months - we will see back in the ID clinic  CKD 3-4 GFR<35= will need IR to place central line for prolonged iv abtx  Metastatic urothelial  carcinoma = followed by dr lanny for immunotherapy to continue on schedule as outpatient.  Diagnosis: Complicated bacteremia with pyelonephritis  Culture Result: MRSA with enterococcus  Allergies  Allergen Reactions   Xarelto  [Rivaroxaban ] Other (See Comments)    Skin Blisters     OPAT Orders Discharge antibiotics to be given via PICC line Discharge antibiotics: Per pharmacy protocol daptomycin  at 10mg /kg renally dosed  Duration: 4 wk End  Date: 07/28/2025  [facility] Care Per Protocol:  Home health RN for IV administration and teaching; PICC line care and labs.    Labs weekly while on IV antibiotics: _x_ CBC with differential _x_ BMP _x_ CK  __x Please arrange for IR  pull central line at completion of IV antibiotics   Fax weekly labs to [phone]  Clinic Follow Up Appt: Mid september   evaluation of this patient requires complex antimicrobial therapy evaluation and counseling and isolation needs for disease transmission risk assessment and mitigation.    [facility] Pager: [phone]  07/03/2024, 1:49 PM

## 2024-07-03 NOTE — Plan of Care (Signed)

## 2024-07-03 NOTE — Progress Notes (Addendum)
 This chaplain responded to PMT PA-Joseph consult for creating the Pt. Advance Directive:  HCPOA and LW. The chaplain understands the Pt. son-Justin will be visiting at 1330 today for this purpose.  The chaplain attempted to introduce herself and begin rapport building. Family is not at the bedside. The Pt. is sleeping at the time of the visit but wakes up to the call of his name.  The Pt. is lethargic during the short visit. The chaplain left AD education at the bedside and will return at 1330.  **1340 This chaplain returned to the bedside with PMT PA-Joseph. The Pt. son-Justin, Justin's wife, and the Pt. wife-[name] are at the bedside. The Pt. describes this morning as a time of rest.   The chaplain began Advance Directive: HCPOA education with the Pt. and family. The Pt. is able to specify his choice of HCPOA-Justin. The Pt. explains to his family the choice of [name] in the context of his concern for his wife's ability to make medical decisions because of her emotions. The Pt. is holding his wife's hand during the conversation.  The chaplain affirmed the presence of family at the bedside and the importance of continued family communication. The chaplain offered the strengthes of a HCPOA as a person designated as a Clinical research associate at the time the Pt. may not have the ability to make decisions for himself. The Pt. and family agreed to pause and revisit Advance Directive at another time.  The chaplain listened reflectively as the family expressed their concerns about previous medical care and desire to have more conversations with the oncology team.  This chaplain will F/U with the Pt. on Friday.  Chaplain Leeroy Hummer [phone]

## 2024-07-03 NOTE — Progress Notes (Signed)
 [facility]                                                                               Adam Harris, is a 78 y.o. male, DOB - 15-Nov-1945, FMW:[id number] Admit date - 06/27/2024    Outpatient Primary MD for the patient is Rudd, Garnette HERO, MD  LOS - 5  days    Brief summary   Adam Harris is a 78 y.o. 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   Assessment & Plan    Assessment and Plan:  Sepsis secondary to MRSA bacteremia and Enterococcus faecalis infection from complicated urinary tract infection ID on board and managing antibiotics.  Patient afebrile and wbc has normalized.  TEE  did not show any endocarditis.    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   Hypertension Blood pressure parameters suboptimal. Add hydralazine  prn.  Patient currently on 15 mg of Imdur , 25 mg of Lopressor  3 times daily  Type 2 diabetes mellitus with hyperglycemia.   Recent Labs    07/03/24 0424 07/03/24 0909 07/03/24 1149  GLUCAP 112* 115* 118*  Continue with sliding scale insulin    Microcytic anemia Hemoglobin around 10.6 today continue to monitor  Heart failure with preserved left ventricular ejection fraction Last echo showed left ventricular ejection fraction of 60 to 65%.  Permanent atrial fibrillation Not on anticoagulation.  Rate controlled with metoprolol   Acute kidney injury with metabolic acidosis secondary to sepsis Creatinine slowly improving.   In view of his poor functional status, palliative care  consulted for goals of care.    Estimated body mass index is 26.9 kg/m as calculated from the following:   Height as of this encounter: 5' 11 (1.803 m).   Weight as of this encounter: 87.5 kg.  Code Status: full code. DVT Prophylaxis:  enoxaparin  (LOVENOX ) injection 40 mg Start: 06/28/24 1000   Level of Care: Level of care: Telemetry Medical Family Communication: none at bedside.  Disposition Plan:     Remains inpatient appropriate:  pending clinical improvement.   Procedures:  TEE  Consultants:   Cardiology Palliative care  ID  Urology.   Antimicrobials:   Anti-infectives (From admission, onward)    Start     Dose/Rate Route Frequency Ordered Stop   07/01/24 1400  DAPTOmycin  (CUBICIN ) IVPB 700 mg/100mL premix  Status:  Discontinued        700 mg 200 mL/hr over 30 Minutes Intravenous Every 48 hours 06/30/24 1445 07/01/24 0955   07/01/24 1045  DAPTOmycin  (CUBICIN ) IVPB 700 mg/100mL premix  Status:  Discontinued        700 mg 200 mL/hr over 30 Minutes Intravenous Daily 07/01/24 0955 07/01/24 1028   07/01/24 1045  DAPTOmycin  (CUBICIN ) 900 mg in sodium chloride  0.9 % IVPB  900 mg 136 mL/hr over 30 Minutes Intravenous Daily 07/01/24 1028     06/29/24 2000  vancomycin  (VANCOCIN ) IVPB 1000 mg/200 mL premix  Status:  Discontinued        1,000 mg 200 mL/hr over 60 Minutes Intravenous Every 24 hours 06/28/24 1922 06/29/24 1543   06/29/24 1700  cefTRIAXone  (ROCEPHIN ) 1 g in sodium chloride  0.9 % 100 mL IVPB  Status:  Discontinued        1 g 200 mL/hr over 30 Minutes Intravenous Every 24 hours 06/29/24 1551 06/30/24 1154   06/29/24 1700  DAPTOmycin  (CUBICIN ) IVPB 700 mg/157mL premix  Status:  Discontinued        700 mg 200 mL/hr over 30 Minutes Intravenous Daily 06/29/24 1552 06/30/24 1445   06/28/24 2015  vancomycin  (VANCOREADY) IVPB 2000 mg/400 mL        2,000 mg 200 mL/hr over 120 Minutes Intravenous  Once 06/28/24 1922 06/29/24 0903   06/28/24 0845   piperacillin -tazobactam (ZOSYN ) IVPB 3.375 g  Status:  Discontinued        3.375 g 12.5 mL/hr over 240 Minutes Intravenous Every 8 hours 06/28/24 0844 06/29/24 1543   06/28/24 0200  piperacillin -tazobactam (ZOSYN ) IVPB 3.375 g        3.375 g 100 mL/hr over 30 Minutes Intravenous  Once 06/28/24 0154 06/28/24 0345        Medications  Scheduled Meds:  artificial tears  2 drop Both Eyes QID   aspirin  EC  81 mg Oral Daily   calcium  carbonate  1 tablet Oral TID WC   enoxaparin  (LOVENOX ) injection  40 mg Subcutaneous Q24H   feeding supplement  237 mL Oral BID BM   Gerhardt's butt cream   Topical BID   insulin  aspart  0-9 Units Subcutaneous Q4H   isosorbide  mononitrate  15 mg Oral Daily   linaclotide   145 mcg Oral QAC breakfast   metoprolol  tartrate  25 mg Oral TID   mirtazapine   15 mg Oral QHS   sodium bicarbonate   650 mg Oral BID   sodium chloride  flush  3 mL Intravenous Q12H   tamsulosin   0.4 mg Oral Daily   Continuous Infusions:  DAPTOmycin  Stopped (07/02/24 1600)   PRN Meds:.acetaminophen  **OR** acetaminophen , albuterol , metoprolol  tartrate, ondansetron  **OR** ondansetron  (ZOFRAN ) IV, oxyCODONE , polyethylene glycol    Subjective:   Adam Harris was seen and examined today.  No new complaints.   Objective:   Vitals:   07/03/24 0044 07/03/24 0515 07/03/24 0708 07/03/24 0800  BP: (!) 137/90 (!) 150/112  (!) 162/107  Pulse: 83 91  98  Resp: 18 18  19   Temp: 97.9 F (36.6 C) 98.5 F (36.9 C)  98.4 F (36.9 C)  TempSrc: Oral Oral  Oral  SpO2: 96% 93%  93%  Weight:   87.5 kg   Height:        Intake/Output Summary (Last 24 hours) at 07/03/2024 1449 Last data filed at 07/03/2024 0943 Gross per 24 hour  Intake 460 ml  Output 975 ml  Net -515 ml   Filed Weights   07/01/24 0500 07/02/24 0721 07/03/24 0708  Weight: 91.5 kg 87.5 kg 87.5 kg     Exam General exam:Ill appearing elderly gentleman, not in distress.  Respiratory system: Clear to auscultation. Respiratory  effort normal. Cardiovascular system: S1 & S2 heard, RRR.  Gastrointestinal system: Abdomen is nondistended, soft and nontender.  Central nervous system: alert and answering questions appropriately.  Extremities: Symmetric 5 x 5 power. Skin: No rashes,  Data Reviewed:  I have personally reviewed following labs and imaging studies   CBC Lab Results  Component Value Date   WBC 9.1 07/03/2024   RBC 4.51 07/03/2024   HGB 10.5 (L) 07/03/2024   HCT 33.5 (L) 07/03/2024   MCV 74.3 (L) 07/03/2024   MCH 23.3 (L) 07/03/2024   PLT 239 07/03/2024   MCHC 31.3 07/03/2024   RDW 20.9 (H) 07/03/2024   LYMPHSABS 0.9 07/03/2024   MONOABS 0.7 07/03/2024   EOSABS 0.1 07/03/2024   BASOSABS 0.1 07/03/2024     Last metabolic panel Lab Results  Component Value Date   NA 138 07/03/2024   K 3.9 07/03/2024   CL 107 07/03/2024   CO2 21 (L) 07/03/2024   BUN 35 (H) 07/03/2024   CREATININE 1.99 (H) 07/03/2024   GLUCOSE 117 (H) 07/03/2024   GFRNONAA 34 (L) 07/03/2024   GFRAA 32 (L) 08/05/2020   CALCIUM  8.4 (L) 07/03/2024   PHOS 4.2 08/06/2023   PROT 7.0 07/01/2024   ALBUMIN  1.8 (L) 07/01/2024   BILITOT 1.3 (H) 07/01/2024   ALKPHOS 130 (H) 07/01/2024   AST 41 07/01/2024   ALT 23 07/01/2024   ANIONGAP 10 07/03/2024    CBG (last 3)  Recent Labs    07/03/24 0424 07/03/24 0909 07/03/24 1149  GLUCAP 112* 115* 118*      Coagulation Profile: Recent Labs  Lab 06/28/24 0111  INR 1.5*     Radiology Studies: ECHO TEE Result Date: 07/02/2024    TRANSESOPHOGEAL ECHO REPORT   Patient Name:   Adam Harris  Date of Exam: 07/02/2024 Medical Rec #:  [id number]  Height:       71.0 in Accession #:    [id number] Weight:       192.9 lb Date of Birth:  1946-06-26   BSA:          2.076 m Patient Age:    78 years   BP:           153/97 mmHg Patient Gender: M          HR:           128 bpm. Exam Location:  Inpatient Procedure: Cardiac Doppler, Color Doppler and Transesophageal Echo (Both             Spectral and Color Flow Doppler were utilized during procedure). Indications:     Endocarditis R78.81  History:         Patient has prior history of Echocardiogram examinations, most                  recent 06/30/2024. CAD and Previous Myocardial Infarction, CKD,                  Arrythmias:Atrial Fibrillation; Risk Factors:Hypertension,                  Dyslipidemia, Diabetes and Former Smoker.  Sonographer:     Koleen Popper RDCS Referring Phys:  [id number] ZANE ADAMS Diagnosing Phys: Jerel Balding MD PROCEDURE: After discussion of the risks and benefits of a TEE, an informed consent was obtained from the patient. Pediatric probe was used. Imaged were obtained with the patient in a left lateral decubitus position. Sedation performed by different physician. The patient was monitored while under deep sedation. Anesthestetic sedation was provided intravenously by Anesthesiology: 245mg  of Propofol , 60mg  of Lidocaine . The patient developed no complications during the procedure.  IMPRESSIONS  1. Left ventricular ejection fraction, by estimation, is 65 to 70%. The  left ventricle has normal function. The left ventricle has no regional wall motion abnormalities. There is moderate concentric left ventricular hypertrophy. Left ventricular diastolic function could not be evaluated.  2. Right ventricular systolic function is normal. The right ventricular size is normal. There is severely elevated pulmonary artery systolic pressure. The estimated right ventricular systolic pressure is 79.0 mmHg.  3. Well seated left atrial appendage occluder (Watchman) device without leak, thrombus or vegetation. Left atrial size was severely dilated. No left atrial/left atrial appendage thrombus was detected.  4. Right atrial size was mildly dilated.  5. The mitral valve is normal in structure. Trivial mitral valve regurgitation. No evidence of mitral stenosis.  6. Tricuspid valve regurgitation is moderate to severe.  7. The aortic valve is  tricuspid. There is mild calcification of the aortic valve. There is mild thickening of the aortic valve. Aortic valve regurgitation is trivial. Aortic valve sclerosis is present, with no evidence of aortic valve stenosis.  8. There is mild (Grade II) plaque involving the aortic arch and descending aorta. Conclusion(s)/Recommendation(s): No evidence of vegetation/infective endocarditis on this transesophageael echocardiogram. FINDINGS  Left Ventricle: Left ventricular ejection fraction, by estimation, is 65 to 70%. The left ventricle has normal function. The left ventricle has no regional wall motion abnormalities. The left ventricular internal cavity size was normal in size. There is  moderate concentric left ventricular hypertrophy. Left ventricular diastolic function could not be evaluated due to atrial fibrillation. Left ventricular diastolic function could not be evaluated. Right Ventricle: The right ventricular size is normal. No increase in right ventricular wall thickness. Right ventricular systolic function is normal. There is severely elevated pulmonary artery systolic pressure. The tricuspid regurgitant velocity is 4.00 m/s, and with an assumed right atrial pressure of 15 mmHg, the estimated right ventricular systolic pressure is 79.0 mmHg. Left Atrium: Well seated left atrial appendage occluder (Watchman) device without leak, thrombus or vegetation. Left atrial size was severely dilated. Spontaneous echo contrast was present in the left atrium. No left atrial/left atrial appendage thrombus  was detected. Right Atrium: Right atrial size was mildly dilated. Pericardium: There is no evidence of pericardial effusion. Mitral Valve: The mitral valve is normal in structure. Trivial mitral valve regurgitation. No evidence of mitral valve stenosis. Tricuspid Valve: The tricuspid valve is grossly normal. Tricuspid valve regurgitation is moderate to severe. Aortic Valve: The aortic valve is tricuspid. There is mild  calcification of the aortic valve. There is mild thickening of the aortic valve. Aortic valve regurgitation is trivial. Aortic valve sclerosis is present, with no evidence of aortic valve stenosis. Pulmonic Valve: The pulmonic valve was normal in structure. Pulmonic valve regurgitation is not visualized. Aorta: The aortic root, ascending aorta, aortic arch and descending aorta are all structurally normal, with no evidence of dilitation or obstruction. There is mild (Grade II) plaque involving the aortic arch and descending aorta. IAS/Shunts: The interatrial septum appears to be lipomatous. No atrial level shunt detected by color flow Doppler. Additional Comments: Spectral Doppler performed. TRICUSPID VALVE TR Peak grad:   64.0 mmHg TR Vmax:        400.00 cm/s Jerel Balding MD Electronically signed by Jerel Balding MD Signature Date/Time: 07/02/2024/1:40:21 PM    Final    EP STUDY Result Date: 07/02/2024 See surgical note for result.      Elgie Butter M.D. [facility] 07/03/2024, 2:49 PM  Available via Epic secure chat 7am-7pm After 7 pm, please refer to night coverage provider listed on amion.

## 2024-07-03 NOTE — Progress Notes (Signed)
 Daily Progress Note   Date: 07/03/2024   Patient Name: Adam Harris  DOB: [birth year]  MRN: [id number]  Age / Sex: 78 y.o., male  Attending Physician: Cherlyn Labella, MD Primary Care Physician: [name], MD Admit Date: 06/27/2024 Length of Stay: 5 days  Reason for Follow-up: Establishing goals of care  Past Medical History:  Diagnosis Date   Abnormal radiologic findings on diagnostic imaging of renal pelvis, ureter, or bladder    bilateral ureter abnormalities   Anticoagulant long-term use    eliquis    Anxiety    pt denies   Arthritis    Atrial fibrillation, chronic (HCC)    CAD (coronary artery disease) cardiologist-- dr hochrein   NSTEMI 02-04-2014  per cardiac cath chronic occluded RCA w/ faint left-to-right collaterals and aneurysmal LCFx with sluggish coronary flow/   NSTEMI --11-21-2016 per cardiac cath occluded proximal RCA & mid to diastal CFX 100%, med rx. 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   CHF (congestive heart failure) (HCC)    CKD (chronic kidney disease), stage III (HCC)    patient unaware   DOE (dyspnea on exertion)    Fatty liver    pt denies   Hematuria 02/2019   History of COVID-19 10/2019   History of non-ST elevation myocardial infarction (NSTEMI)    02-04-2014  and 11-21-2016  cardiac cath done both times ,  medically management   History of shingles 12/2017   slight pain and numbness still noted in the area   Hyperlipidemia    Hypertension    Insomnia    Myocardial infarction [facility]) 2015   Persistent atrial fibrillation 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 Kennyth   Thoracic aortic atherosclerosis (HCC)    Type 2 diabetes mellitus (HCC)    Urinary frequency     Subjective:   Subjective: Chart Reviewed. Updates received. Patient Assessed. Created space and opportunity for patient  and family to  explore thoughts and feelings regarding current medical situation.  People present in person during meeting today: Wife Adam Harris) Son Adam Harris) Daughter in Social worker Adam Harris) Patient Chaplain Adam Harris)  Today's Discussion: Today before meeting with the patient/family, I reviewed the chart notes. I also reviewed vital signs, nursing flowsheets, medication administrations record, labs, and imaging.  - Had a goals of care meeting with family today and everyone was tearful as expected. Discussed HCPOA and living will with family and emphasized that treatment course would not change but mainly wanted more clarity going forward in case patient is no longer able to make decisions for himself - Patient made it clear that he wanted [name] to be his HCPOA and wife was tearful when hearing that news  Review of Systems  Constitutional:  Positive for fatigue.  Respiratory:  Positive for shortness of breath.     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     Nose: Nose normal.     Mouth/Throat:     Mouth: Mucous membranes are dry.  Eyes:     Extraocular Movements: Extraocular movements intact.  Pulmonary:     Effort: Pulmonary effort is normal.     Breath sounds: Normal breath sounds.  Neurological:     Mental Status: He is alert.    Advanced Care  Planning:   Primary Decision Maker: PATIENT  Pertinent diagnosis: Metastatic urothelial carcinoma with percutaneous nephrostomy  Summary of the conversation: Chaplain introduced the concept of HCPOA and living will to family and patient who were present (wife, son, daughter in law). No documentation completed and left with family to continue to discuss.   Outcome of the conversations and/or documents completed: No change in HCPOA, living will, or code status changed.   I spent 15 minutes providing separately identifiable ACP services with the patient and/or surrogate decision maker in a voluntary, in-person conversation discussing the patient's wishes and goals as detailed in the above note.  Assessment & Plan:   HPI/Patient Profile:  78 yo male   SUMMARY OF RECOMMENDATIONS   Continue goals of care conversation as well as HCPOA and living will  Symptom Management:  None  Code Status: Full Code  Prognosis: < 12 months  Discharge Planning: To Be Determined  Discussed with: Cherlyn, MD  Thank you for allowing us  to participate in the care of Stace Peace PMT will continue to support holistically.  Time Total: 45 minutes  Detailed review of medical records (labs, imaging, vital signs), medically appropriate exam, discussed with treatment team, counseling and education to patient, family, & staff, documenting clinical information, medication management, coordination of care.   Fairy FORBES Shan DEVONNA  Palliative Medicine Team  Team Phone # [phone] (Nights/Weekends) 07/03/2024 7:48 AM

## 2024-07-03 NOTE — Progress Notes (Incomplete)
Doxy

## 2024-07-04 ENCOUNTER — Inpatient Hospital Stay (HOSPITAL_COMMUNITY)

## 2024-07-04 DIAGNOSIS — Z7189 Other specified counseling: Secondary | ICD-10-CM | POA: Diagnosis not present

## 2024-07-04 DIAGNOSIS — Z515 Encounter for palliative care: Secondary | ICD-10-CM | POA: Diagnosis not present

## 2024-07-04 DIAGNOSIS — R531 Weakness: Secondary | ICD-10-CM | POA: Diagnosis not present

## 2024-07-04 DIAGNOSIS — A419 Sepsis, unspecified organism: Secondary | ICD-10-CM | POA: Diagnosis not present

## 2024-07-04 DIAGNOSIS — Z936 Other artificial openings of urinary tract status: Secondary | ICD-10-CM | POA: Diagnosis not present

## 2024-07-04 DIAGNOSIS — I10 Essential (primary) hypertension: Secondary | ICD-10-CM | POA: Diagnosis not present

## 2024-07-04 DIAGNOSIS — C689 Malignant neoplasm of urinary organ, unspecified: Secondary | ICD-10-CM | POA: Diagnosis not present

## 2024-07-04 LAB — CBC WITH DIFFERENTIAL/PLATELET
Abs Granulocyte: 7.2 K/uL — ABNORMAL HIGH (ref 1.5–6.5)
Abs Immature Granulocytes: 0.9 K/uL — ABNORMAL HIGH (ref 0.00–0.07)
Basophils Absolute: 0.1 K/uL (ref 0.0–0.1)
Basophils Relative: 1 %
Eosinophils Absolute: 0.1 K/uL (ref 0.0–0.5)
Eosinophils Relative: 1 %
HCT: 32.6 % — ABNORMAL LOW (ref 39.0–52.0)
Hemoglobin: 10.1 g/dL — ABNORMAL LOW (ref 13.0–17.0)
Immature Granulocytes: 9 %
Lymphocytes Relative: 11 %
Lymphs Abs: 1.1 K/uL (ref 0.7–4.0)
MCH: 23.2 pg — ABNORMAL LOW (ref 26.0–34.0)
MCHC: 31 g/dL (ref 30.0–36.0)
MCV: 74.8 fL — ABNORMAL LOW (ref 80.0–100.0)
Monocytes Absolute: 0.9 K/uL (ref 0.1–1.0)
Monocytes Relative: 9 %
Neutro Abs: 7.2 K/uL (ref 1.7–7.7)
Neutrophils Relative %: 69 %
Platelets: 241 K/uL (ref 150–400)
RBC: 4.36 MIL/uL (ref 4.22–5.81)
RDW: 21.1 % — ABNORMAL HIGH (ref 11.5–15.5)
Smear Review: NORMAL
WBC: 10.2 K/uL (ref 4.0–10.5)
nRBC: 0.6 % — ABNORMAL HIGH (ref 0.0–0.2)

## 2024-07-04 LAB — BASIC METABOLIC PANEL WITH GFR
Anion gap: 16 — ABNORMAL HIGH (ref 5–15)
BUN: 32 mg/dL — ABNORMAL HIGH (ref 8–23)
CO2: 19 mmol/L — ABNORMAL LOW (ref 22–32)
Calcium: 8.7 mg/dL — ABNORMAL LOW (ref 8.9–10.3)
Chloride: 117 mmol/L — ABNORMAL HIGH (ref 98–111)
Creatinine, Ser: 1.87 mg/dL — ABNORMAL HIGH (ref 0.61–1.24)
GFR, Estimated: 37 mL/min — ABNORMAL LOW (ref >60)
Glucose, Bld: 108 mg/dL — ABNORMAL HIGH (ref 70–99)
Potassium: 4.2 mmol/L (ref 3.5–5.1)
Sodium: 141 mmol/L (ref 135–145)

## 2024-07-04 LAB — GLUCOSE, CAPILLARY
Glucose-Capillary: 111 mg/dL — ABNORMAL HIGH (ref 70–99)
Glucose-Capillary: 137 mg/dL — ABNORMAL HIGH (ref 70–99)
Glucose-Capillary: 160 mg/dL — ABNORMAL HIGH (ref 70–99)
Glucose-Capillary: 162 mg/dL — ABNORMAL HIGH (ref 70–99)
Glucose-Capillary: 194 mg/dL — ABNORMAL HIGH (ref 70–99)

## 2024-07-04 MED ORDER — HEPARIN SOD (PORK) LOCK FLUSH 100 UNIT/ML IV SOLN
INTRAVENOUS | Status: AC
  Filled 2024-07-04: qty 5

## 2024-07-04 MED ORDER — OXYCODONE HCL 5 MG PO TABS
2.5000 mg | ORAL_TABLET | Freq: Four times a day (QID) | ORAL | Status: DC | PRN
  Administered 2024-07-04 – 2024-07-06 (×4): 5 mg via ORAL
  Filled 2024-07-04 (×5): qty 1

## 2024-07-04 MED ORDER — LIDOCAINE-EPINEPHRINE 1 %-1:100000 IJ SOLN
INTRAMUSCULAR | Status: AC
  Filled 2024-07-04: qty 1

## 2024-07-04 MED ORDER — CHLORHEXIDINE GLUCONATE CLOTH 2 % EX PADS
6.0000 | MEDICATED_PAD | Freq: Every day | CUTANEOUS | Status: DC
  Administered 2024-07-05 – 2024-07-10 (×7): 6 via TOPICAL

## 2024-07-04 NOTE — Plan of Care (Signed)
   Problem: Education: Goal: Knowledge of General Education information will improve Description: Including pain rating scale, medication(s)/side effects and non-pharmacologic comfort measures Outcome: Progressing   Problem: Activity: Goal: Risk for activity intolerance will decrease Outcome: Progressing   Problem: Coping: Goal: Level of anxiety will decrease Outcome: Progressing

## 2024-07-04 NOTE — Procedures (Signed)
  Procedure:  Tunneled R internal jugular CVC placement 24cm Preprocedure diagnosis: The primary encounter diagnosis was Delirium. A diagnosis of Fever, unspecified fever cause was also pertinent to this visit. Postprocedure diagnosis: same EBL:    minimal Complications:   none immediate  See full dictation in [employer].  CHARM Toribio Faes MD Main # [phone] Pager  [phone] Mobile [phone]

## 2024-07-04 NOTE — Progress Notes (Signed)
 Patient ID: Adam Harris, male   DOB: 10/18/1946, 78 y.o.   MRN: [id number] IR was contacted regarding persistent slow oozing bleeding from right tunneled CVC site which was just placed today. Went to evaluate site at bedside, there was large clot under tegaderm dressing and very slow tricking bleed. Pressure was held along with  QuickClot gauze pad. After about 10 minutes, no further visualized bleeding. New dressing was applied and nursing was notified.   Please reach out to IR with any additional questions or concerns.  Kimble DEL Raydell Maners PA-C 07/04/2024 2:32 PM

## 2024-07-04 NOTE — Progress Notes (Signed)
 [facility]                                                                               Adam Harris, is a 78 y.o. male, DOB - 05-06-1946, FMW:[id number] Admit date - 06/27/2024    Outpatient Primary MD for the patient is Rudd, Garnette HERO, MD  LOS - 6  days    Brief summary   Adam Harris is a 78 y.o. 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   Assessment & Plan    Assessment and Plan:  Sepsis secondary to MRSA bacteremia and Enterococcus faecalis infection from complicated urinary tract infection ID on board and managing antibiotics.  Patient afebrile and wbc has normalized.  TEE  did not show any endocarditis.  IR consulted for central venous access for prolonged antibiotics.    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 Pain not well controlled, added oxy back to his regimen.    Hypertension Well controlled today.  Patient currently on 15 mg of Imdur , 25 mg of Lopressor  3 times daily  Type 2 diabetes mellitus with hyperglycemia.   Recent Labs    07/03/24 2014 07/04/24 0003 07/04/24 0427  GLUCAP 190* 137* 111*  Continue with sliding scale insulin    Microcytic anemia Hemoglobin stable around 10.   Heart failure with preserved left ventricular ejection fraction Last echo showed left ventricular ejection fraction of 60 to 65%.  Permanent atrial fibrillation Not on anticoagulation.  Rate controlled with metoprolol   Acute kidney injury with metabolic acidosis secondary to sepsis Creatinine slowly  improving.   In view of his poor functional status, palliative care consulted for goals of care. Discussions ongoing.  Recommended to follow u with Dr Lanny as outpatient.    Estimated body mass index is 26.9 kg/m as calculated from the following:   Height as of this encounter: 5' 11 (1.803 m).   Weight as of this encounter: 87.5 kg.  Code Status: full code. DVT Prophylaxis:  enoxaparin  (LOVENOX ) injection 40 mg Start: 06/28/24 1000   Level of Care: Level of care: Telemetry Medical Family Communication: none at bedside.  Disposition Plan:     Remains inpatient appropriate:  pending clinical improvement.   Procedures:  TEE  Consultants:   Cardiology Palliative care  ID  Urology.   Antimicrobials:   Anti-infectives (From admission, onward)    Start     Dose/Rate Route Frequency Ordered Stop   07/01/24 1400  DAPTOmycin  (CUBICIN ) IVPB 700 mg/100mL premix  Status:  Discontinued        700 mg 200 mL/hr over 30 Minutes Intravenous Every 48 hours 06/30/24 1445 07/01/24 0955   07/01/24 1045  DAPTOmycin  (CUBICIN ) IVPB 700 mg/100mL premix  Status:  Discontinued        700 mg 200 mL/hr over 30 Minutes  Intravenous Daily 07/01/24 0955 07/01/24 1028   07/01/24 1045  DAPTOmycin  (CUBICIN ) 900 mg in sodium chloride  0.9 % IVPB        900 mg 136 mL/hr over 30 Minutes Intravenous Daily 07/01/24 1028     06/29/24 2000  vancomycin  (VANCOCIN ) IVPB 1000 mg/200 mL premix  Status:  Discontinued        1,000 mg 200 mL/hr over 60 Minutes Intravenous Every 24 hours 06/28/24 1922 06/29/24 1543   06/29/24 1700  cefTRIAXone  (ROCEPHIN ) 1 g in sodium chloride  0.9 % 100 mL IVPB  Status:  Discontinued        1 g 200 mL/hr over 30 Minutes Intravenous Every 24 hours 06/29/24 1551 06/30/24 1154   06/29/24 1700  DAPTOmycin  (CUBICIN ) IVPB 700 mg/100mL premix  Status:  Discontinued        700 mg 200 mL/hr over 30 Minutes Intravenous Daily 06/29/24 1552 06/30/24 1445   06/28/24 2015  vancomycin  (VANCOREADY)  IVPB 2000 mg/400 mL        2,000 mg 200 mL/hr over 120 Minutes Intravenous  Once 06/28/24 1922 06/29/24 0903   06/28/24 0845  piperacillin -tazobactam (ZOSYN ) IVPB 3.375 g  Status:  Discontinued        3.375 g 12.5 mL/hr over 240 Minutes Intravenous Every 8 hours 06/28/24 0844 06/29/24 1543   06/28/24 0200  piperacillin -tazobactam (ZOSYN ) IVPB 3.375 g        3.375 g 100 mL/hr over 30 Minutes Intravenous  Once 06/28/24 0154 06/28/24 0345        Medications  Scheduled Meds:  artificial tears  2 drop Both Eyes QID   aspirin  EC  81 mg Oral Daily   calcium  carbonate  1 tablet Oral TID WC   enoxaparin  (LOVENOX ) injection  40 mg Subcutaneous Q24H   feeding supplement  237 mL Oral BID BM   Gerhardt's butt cream   Topical BID   insulin  aspart  0-9 Units Subcutaneous Q4H   isosorbide  mononitrate  15 mg Oral Daily   linaclotide   145 mcg Oral QAC breakfast   metoprolol  tartrate  25 mg Oral TID   mirtazapine   15 mg Oral QHS   sodium bicarbonate   650 mg Oral BID   sodium chloride  flush  3 mL Intravenous Q12H   tamsulosin   0.4 mg Oral Daily   Continuous Infusions:  DAPTOmycin  900 mg (07/03/24 1601)   PRN Meds:.acetaminophen  **OR** acetaminophen , albuterol , hydrOXYzine , metoprolol  tartrate, ondansetron  **OR** ondansetron  (ZOFRAN ) IV, oxyCODONE , polyethylene glycol    Subjective:   Adam Harris was seen and examined today.  Comfortable, not in distress. More alert today.   Objective:   Vitals:   07/03/24 1700 07/03/24 1918 07/04/24 0419 07/04/24 0813  BP:  (!) 157/93 (!) 147/99 (!) 143/75  Pulse:  92 94 94  Resp:  15 19 18   Temp:  98.8 F (37.1 C) (!) 97.5 F (36.4 C) 97.9 F (36.6 C)  TempSrc:    Oral  SpO2: 93% 94% (!) 89% 97%  Weight:      Height:        Intake/Output Summary (Last 24 hours) at 07/04/2024 1229 Last data filed at 07/04/2024 0900 Gross per 24 hour  Intake 960 ml  Output 1000 ml  Net -40 ml   Filed Weights   07/01/24 0500 07/02/24 0721 07/03/24 0708   Weight: 91.5 kg 87.5 kg 87.5 kg     Exam General exam: Appears calm and comfortable  Respiratory system: Clear to auscultation. Respiratory effort normal. Cardiovascular system: S1 &  S2 heard, RRR. No JVD,  Gastrointestinal system: Abdomen is nondistended, soft and nontender.  Central nervous system: Alert and oriented to person and place.  Extremities: no cyanosis.  Skin: No rashes, Psychiatry: Mood & affect appropriate.       Data Reviewed:  I have personally reviewed following labs and imaging studies   CBC Lab Results  Component Value Date   WBC 10.2 07/04/2024   RBC 4.36 07/04/2024   HGB 10.1 (L) 07/04/2024   HCT 32.6 (L) 07/04/2024   MCV 74.8 (L) 07/04/2024   MCH 23.2 (L) 07/04/2024   PLT 241 07/04/2024   MCHC 31.0 07/04/2024   RDW 21.1 (H) 07/04/2024   LYMPHSABS 1.1 07/04/2024   MONOABS 0.9 07/04/2024   EOSABS 0.1 07/04/2024   BASOSABS 0.1 07/04/2024     Last metabolic panel Lab Results  Component Value Date   NA 141 07/04/2024   K 4.2 07/04/2024   CL 117 (H) 07/04/2024   CO2 19 (L) 07/04/2024   BUN 32 (H) 07/04/2024   CREATININE 1.87 (H) 07/04/2024   GLUCOSE 108 (H) 07/04/2024   GFRNONAA 37 (L) 07/04/2024   GFRAA 32 (L) 08/05/2020   CALCIUM  8.7 (L) 07/04/2024   PHOS 4.2 08/06/2023   PROT 7.0 07/01/2024   ALBUMIN  1.8 (L) 07/01/2024   BILITOT 1.3 (H) 07/01/2024   ALKPHOS 130 (H) 07/01/2024   AST 41 07/01/2024   ALT 23 07/01/2024   ANIONGAP 16 (H) 07/04/2024    CBG (last 3)  Recent Labs    07/03/24 2014 07/04/24 0003 07/04/24 0427  GLUCAP 190* 137* 111*      Coagulation Profile: Recent Labs  Lab 06/28/24 0111  INR 1.5*     Radiology Studies: US  EKG SITE RITE Result Date: 07/03/2024 If Site Rite image not attached, placement could not be confirmed due to current cardiac rhythm.  ECHO TEE Result Date: 07/02/2024    TRANSESOPHOGEAL ECHO REPORT   Patient Name:   QUASHON JESUS  Date of Exam: 07/02/2024 Medical Rec #:  [id number]   Height:       71.0 in Accession #:    [id number] Weight:       192.9 lb Date of Birth:  1946/11/01   BSA:          2.076 m Patient Age:    78 years   BP:           153/97 mmHg Patient Gender: M          HR:           128 bpm. Exam Location:  Inpatient Procedure: Cardiac Doppler, Color Doppler and Transesophageal Echo (Both            Spectral and Color Flow Doppler were utilized during procedure). Indications:     Endocarditis R78.81  History:         Patient has prior history of Echocardiogram examinations, most                  recent 06/30/2024. CAD and Previous Myocardial Infarction, CKD,                  Arrythmias:Atrial Fibrillation; Risk Factors:Hypertension,                  Dyslipidemia, Diabetes and Former Smoker.  Sonographer:     Koleen Popper RDCS Referring Phys:  [id number] ZANE ADAMS Diagnosing Phys: Jerel Balding MD PROCEDURE: After discussion of the risks and benefits of a TEE, an informed consent  was obtained from the patient. Pediatric probe was used. Imaged were obtained with the patient in a left lateral decubitus position. Sedation performed by different physician. The patient was monitored while under deep sedation. Anesthestetic sedation was provided intravenously by Anesthesiology: 245mg  of Propofol , 60mg  of Lidocaine . The patient developed no complications during the procedure.  IMPRESSIONS  1. Left ventricular ejection fraction, by estimation, is 65 to 70%. The left ventricle has normal function. The left ventricle has no regional wall motion abnormalities. There is moderate concentric left ventricular hypertrophy. Left ventricular diastolic function could not be evaluated.  2. Right ventricular systolic function is normal. The right ventricular size is normal. There is severely elevated pulmonary artery systolic pressure. The estimated right ventricular systolic pressure is 79.0 mmHg.  3. Well seated left atrial appendage occluder (Watchman) device without leak, thrombus or vegetation. Left  atrial size was severely dilated. No left atrial/left atrial appendage thrombus was detected.  4. Right atrial size was mildly dilated.  5. The mitral valve is normal in structure. Trivial mitral valve regurgitation. No evidence of mitral stenosis.  6. Tricuspid valve regurgitation is moderate to severe.  7. The aortic valve is tricuspid. There is mild calcification of the aortic valve. There is mild thickening of the aortic valve. Aortic valve regurgitation is trivial. Aortic valve sclerosis is present, with no evidence of aortic valve stenosis.  8. There is mild (Grade II) plaque involving the aortic arch and descending aorta. Conclusion(s)/Recommendation(s): No evidence of vegetation/infective endocarditis on this transesophageael echocardiogram. FINDINGS  Left Ventricle: Left ventricular ejection fraction, by estimation, is 65 to 70%. The left ventricle has normal function. The left ventricle has no regional wall motion abnormalities. The left ventricular internal cavity size was normal in size. There is  moderate concentric left ventricular hypertrophy. Left ventricular diastolic function could not be evaluated due to atrial fibrillation. Left ventricular diastolic function could not be evaluated. Right Ventricle: The right ventricular size is normal. No increase in right ventricular wall thickness. Right ventricular systolic function is normal. There is severely elevated pulmonary artery systolic pressure. The tricuspid regurgitant velocity is 4.00 m/s, and with an assumed right atrial pressure of 15 mmHg, the estimated right ventricular systolic pressure is 79.0 mmHg. Left Atrium: Well seated left atrial appendage occluder (Watchman) device without leak, thrombus or vegetation. Left atrial size was severely dilated. Spontaneous echo contrast was present in the left atrium. No left atrial/left atrial appendage thrombus  was detected. Right Atrium: Right atrial size was mildly dilated. Pericardium: There is no  evidence of pericardial effusion. Mitral Valve: The mitral valve is normal in structure. Trivial mitral valve regurgitation. No evidence of mitral valve stenosis. Tricuspid Valve: The tricuspid valve is grossly normal. Tricuspid valve regurgitation is moderate to severe. Aortic Valve: The aortic valve is tricuspid. There is mild calcification of the aortic valve. There is mild thickening of the aortic valve. Aortic valve regurgitation is trivial. Aortic valve sclerosis is present, with no evidence of aortic valve stenosis. Pulmonic Valve: The pulmonic valve was normal in structure. Pulmonic valve regurgitation is not visualized. Aorta: The aortic root, ascending aorta, aortic arch and descending aorta are all structurally normal, with no evidence of dilitation or obstruction. There is mild (Grade II) plaque involving the aortic arch and descending aorta. IAS/Shunts: The interatrial septum appears to be lipomatous. No atrial level shunt detected by color flow Doppler. Additional Comments: Spectral Doppler performed. TRICUSPID VALVE TR Peak grad:   64.0 mmHg TR Vmax:  400.00 cm/s Jerel Balding MD Electronically signed by Jerel Balding MD Signature Date/Time: 07/02/2024/1:40:21 PM    Final    EP STUDY Result Date: 07/02/2024 See surgical note for result.      Elgie Butter M.D. [facility] 07/04/2024, 12:29 PM  Available via Epic secure chat 7am-7pm After 7 pm, please refer to night coverage provider listed on amion.

## 2024-07-04 NOTE — Plan of Care (Signed)
  Problem: Education: Goal: Knowledge of General Education information will improve Description: Including pain rating scale, medication(s)/side effects and non-pharmacologic comfort measures Outcome: Progressing   Problem: Health Behavior/Discharge Planning: Goal: Ability to manage health-related needs will improve Outcome: Progressing   Problem: Coping: Goal: Level of anxiety will decrease Outcome: Progressing   Problem: Elimination: Goal: Will not experience complications related to bowel motility Outcome: Progressing   Problem: Skin Integrity: Goal: Risk for impaired skin integrity will decrease Outcome: Progressing   Problem: Education: Goal: Ability to describe self-care measures that may prevent or decrease complications (Diabetes Survival Skills Education) will improve Outcome: Progressing

## 2024-07-04 NOTE — Progress Notes (Signed)
 Consult for CVC dressing. Site assessed. Gauze dressing clean, dry and intact.  Recommendation is to leave in place for 48 hours to allow hemostasis at site. RN made aware.

## 2024-07-04 NOTE — Progress Notes (Signed)
   Patient Status: [facility] - Out-pt  Assessment and Plan: Patient in need of venous access for home antibiotics.  Patient with chronic kidney disease, sepsis, MRSA bacteremia in need of central venous access for home abx.  Tunneled CVC requested.   Risks and benefits discussed with the patient including, but not limited to bleeding, infection, vascular injury, pneumothorax which may require chest tube placement, air embolism or even death  All of the patient's questions were answered, patient is agreeable to proceed. Consent signed and in chart.  ______________________________________________________________________   History of Present Illness: Adam Harris is a 78 y.o. 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 He was found to have urosepsis, MRSA bacteremia and will need proonged abx.  Tunneled central line quested.   Allergies and medications reviewed.   Review of Systems: A 12 point ROS discussed and pertinent positives are indicated in the HPI above.  All other systems are negative.  Review of Systems  Constitutional:  Negative for fatigue and fever.  Respiratory:  Negative for cough and shortness of breath.   Cardiovascular:  Negative for chest pain.  Gastrointestinal:  Negative for abdominal pain, nausea and vomiting.  Musculoskeletal:  Negative for back pain.  Psychiatric/Behavioral:  Negative for behavioral problems and confusion.     Vital Signs: BP (!) 143/75 (BP Location: Left Arm)   Pulse 94   Temp 97.9 F (36.6 C) (Oral)   Resp 18   Ht 5' 11 (1.803 m)   Wt 192 lb 14.4 oz (87.5 kg)   SpO2 97%   BMI 26.90 kg/m   Physical Exam Vitals and nursing note  reviewed.  Constitutional:      General: He is not in acute distress.    Appearance: Normal appearance. He is ill-appearing.  Cardiovascular:     Rate and Rhythm: Normal rate.  Pulmonary:     Effort: Pulmonary effort is normal.  Neurological:     General: No focal deficit present.     Mental Status: He is alert and oriented to person, place, and time.  Psychiatric:        Mood and Affect: Mood normal.        Behavior: Behavior normal.      Imaging reviewed.   Labs:  COAGS: Recent Labs    12/23/23 0025 03/07/24 0806 05/26/24 1014 05/30/24 0521 06/28/24 0111  INR 1.4* 1.6* 1.6* 1.5* 1.5*  APTT 60* 41*  --   --   --     BMP: Recent Labs    07/01/24 0710 07/02/24 0445 07/03/24 0443 07/04/24 0347  NA 135 136 138 141  K 3.7 3.7 3.9 4.2  CL 108 107 107 117*  CO2 19* 20* 21* 19*  GLUCOSE 104* 95 117* 108*  BUN 36* 36* 35* 32*  CALCIUM  8.1* 8.1* 8.4* 8.7*  CREATININE 2.09* 2.02* 1.99* 1.87*  GFRNONAA 32* 33* 34* 37*       Electronically Signed: Solmon Selmer Ku, PA 07/04/2024, 12:32 PM   I spent a total of 15 minutes in face to face in clinical consultation, greater than 50% of which was counseling/coordinating care for venous access.

## 2024-07-04 NOTE — Progress Notes (Signed)
 Daily Progress Note   Patient Name: Adam Harris       Date: 07/04/2024 DOB: Mar 23, 1946  Age: 78 y.o. MRN#: [id number] Attending Physician: Cherlyn Labella, MD Primary Care Physician: [name], MD Admit Date: 06/27/2024  Reason for Consultation/Follow-up: Establishing goals of care  Subjective: Feels okay - having some pain, oxy was dc'd yesterday d/t drowsiness, he is requesting it  Length of Stay: 6  Current Medications: Scheduled Meds:   artificial tears  2 drop Both Eyes QID   aspirin  EC  81 mg Oral Daily   calcium  carbonate  1 tablet Oral TID WC   enoxaparin  (LOVENOX ) injection  40 mg Subcutaneous Q24H   feeding supplement  237 mL Oral BID BM   Gerhardt's butt cream   Topical BID   insulin  aspart  0-9 Units Subcutaneous Q4H   isosorbide  mononitrate  15 mg Oral Daily   linaclotide   145 mcg Oral QAC breakfast   metoprolol  tartrate  25 mg Oral TID   mirtazapine   15 mg Oral QHS   sodium bicarbonate   650 mg Oral BID   sodium chloride  flush  3 mL Intravenous Q12H   tamsulosin   0.4 mg Oral Daily    Continuous Infusions:  DAPTOmycin  900 mg (07/03/24 1601)    PRN Meds: acetaminophen  **OR** acetaminophen , albuterol , hydrOXYzine , metoprolol  tartrate, ondansetron  **OR** ondansetron  (ZOFRAN ) IV, oxyCODONE , polyethylene glycol  Physical Exam Constitutional:      General: He is not in acute distress.    Appearance: He is ill-appearing.  Pulmonary:     Effort: Pulmonary effort is normal.  Skin:    General: Skin is warm and dry.             Vital Signs: BP (!) 143/75 (BP Location: Left Arm)   Pulse 94   Temp 97.9 F (36.6 C) (Oral)   Resp 18   Ht 5' 11 (1.803 m)   Wt 87.5 kg   SpO2 97%   BMI 26.90 kg/m  SpO2: SpO2: 97 % O2 Device: O2 Device: Room Air O2 Flow Rate: O2 Flow Rate  (L/min): 5 L/min  Intake/output summary:  Intake/Output Summary (Last 24 hours) at 07/04/2024 1207 Last data filed at 07/04/2024 0900 Gross per 24 hour  Intake 960 ml  Output 1000 ml  Net -40 ml   LBM: Last BM Date : 07/02/24 Baseline Weight: Weight: 89.8 kg Most recent weight: Weight: 87.5 kg       Palliative Assessment/Data: PPS 40%      Patient Active Problem List   Diagnosis Date Noted   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   Palliative care by specialist 07/02/2024   DNR (do not resuscitate) discussion 07/02/2024   Fall 06/28/2024   Sepsis secondary to UTI (HCC) 06/28/2024   Microcytic anemia 06/28/2024   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) 06/27/2024   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) 06/27/2024   Acute delirium 06/27/2024   Pressure injury of left buttock, stage 3 (HCC) 06/19/2024   Moderate protein-calorie malnutrition (HCC) 06/19/2024   Opioid-induced constipation 06/19/2024   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) 06/04/2024   Generalized weakness 05/25/2024   Complicated UTI (urinary tract infection) 05/25/2024   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) 05/25/2024   Nephrostomy present (HCC) 04/01/2024   AKI (acute kidney injury) (HCC) 03/08/2024   Tachyarrhythmia 03/07/2024   Acute blood loss anemia 03/07/2024   Dysuria 01/25/2024   Presence of Watchman left atrial appendage closure device 12/13/2023   Permanent atrial fibrillation (HCC) 11/29/2023   Hx of hematuria 11/29/2023   Lower urinary tract symptoms (LUTS) 04/06/2023   CKD (chronic kidney disease), stage IV (HCC) 03/22/2023   Prostate cancer (HCC) 06/21/2022   Cataract cortical, senile, left 12/13/2021   Diabetic peripheral neuropathy (HCC) 12/13/2021   Hydrocele- Left 12/02/2021   Solitary kidney, acquired 10/26/2021   Hypertensive renal disease 10/26/2021   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   Chronic diastolic CHF (congestive heart failure), NYHA class 2 (HCC) 04/02/2017   Atrial fibrillation with RVR (HCC) 11/20/2016   OA (osteoarthritis) of knee 05/31/2016   Low back pain 05/31/2016   Exertional angina (HCC) 10/29/2014   Chronic kidney disease, stage 3b (HCC) 10/29/2014   Coronary artery disease involving native coronary artery of native heart with angina pectoris (HCC) 03/09/2014   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/2014   Hyperlipidemia 02/05/2014   History of non-ST elevation myocardial infarction (NSTEMI) 02/03/2014   Essential hypertension 02/03/2014   Anxiety state 04/19/2013   History of prostate cancer 04/19/2013   Insomnia 04/19/2013   Stiffness of joint, lower leg 04/19/2013   Sciatica 04/19/2013    Palliative Care Assessment & Plan   HPI: 78 yo male with 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 Patient received first round of chemotherapy on 8/13 and presented on 8/15 for evaluation of a fall and confusion. Diagnosed with sepsis secondary to MRSA bacteremia and Enterococcus faecalis infection from complicated urinary tract infection. PMT consulted to discuss GOC.  Assessment: Follow up today with patient. Visited him twice throughout the day.  When I initially see him, he is tearful and restless. Nurse at bedside. Reports he had been restless throughout the morning. We discuss how he is feeling. He does complain of some pain, prefers oxycodone . He all speaks of his family and his worries about them. Emotional support provided.  Discussed oxycodone  with Dr Akula as she had discontinued yesterday due to mental status - she will add back on low dose today.  Reviewed goals of care with patient. He continues to hope for improvement in current situation and wants to continue treatment for bacteremia. Has  had venous access placed today for home antibiotics.  He tells me he wants his son to be HCPOA. He tells me he loves and trusts his wife but feels the medical decisions are too difficult for her to make and so he would like his son to be this person. We review thoughts on code status - he has been clear with past providers DNR/DNI are his wishes. He confirms this with me again today, telling me he would never want to go through CPR or be on a ventilator. We review that at this time he remains full code. We review that his family has expressed disagreement with his desire to change code status. We discuss a plan to speak with them regarding code status.  Spoke to [name]. He understands role as HCPOA - tells me he wants to honor his dad's wishes even if he doesn't agree with them. We review code status -  review what his father told me earlier regarding his wishes. Discussed evidenced based poor outcomes in similar hospitalized patients, as the cause of the arrest is likely associated with chronic/terminal disease rather than a reversible acute cardio-pulmonary event. [name] expresses understanding, though does express some reluctance to change code status. Shares he would want his father to receive those measures and if he could return to good quality of life. We discuss that given his dad's baseline status, the chance of him returning to good quality of life following a cardiac arrest and resuscitation is very unlikely. He expresses understanding. He requests time to discuss this with his father this evening before changing code status.   Went to bedside later to assist with HCPOA completion - wife is at bedside. We reviewed patient condition. Patient shares his wishes with her that their son be HCPOA and she accepts. HCPOA completed with chaplain and notary.    Recommendations/Plan: Ongoing code status conversations - patient wants DNR/DNI but continues to discuss with family Patient has named son  [name] as HCPOA Otherwise, continue current measures and allow time for outcomes  Care plan was discussed with patient, wife, son, RN, chaplain, Dr Cherlyn  Thank you for allowing the Palliative Medicine Team to assist in the care of this patient.   Total Time 70 minutes Prolonged Time Billed  yes   Time spent includes: Detailed review of medical records (labs, imaging, vital signs), medically appropriate exam, discussion with treatment team, counseling and educating patient, family and/or staff, documenting clinical information, medication management and coordination of care.     *Please note that this is a verbal dictation therefore any spelling or grammatical errors are due to the Dragon [employer] One system interpretation.  Tobey Jama Barnacle, DNP, [facility] Palliative Medicine Team Team Phone # [phone]  Pager [phone]

## 2024-07-04 NOTE — Evaluation (Signed)
 Occupational Therapy Evaluation Patient Details Name: Adam Harris MRN: [id number] DOB: [birth year] Today's Date: 07/04/2024   History of Present Illness   Adam Harris is a 78 y.o. male who  presented 8/15 with a fall and confusion after a recent nephrostomy replacement. Admitted for Sepsis secondary to MRSA bacteremia and Enterococcus faecalis infection from UTI. 8/20 TEE. PMHx: hypertension, hyperlipidemia, HFpEF,  atrial fibrillation not on anticoagulation, CAD, diabetes mellitus type 2, metastatic urothelial cancer s/p nephrostomy tube placement, chronic kidney disease stage IV, prostate cancer, fatty liver, and anxiety     Clinical Impressions [name] was evaluated s/p the above admission list. Per chart review, pt was recently discharged from SNF to home. Per report of family, pt needed assist for mobility with a RW and ADLs at baseline, he was also having falls. Upon evaluation the pt was limited by impaired cognition, weakness and limited activity tolerance. Evaluation limited by bleeding at the CVC site, pressure held by this therapist, RN and PA arrived to address bleeding. Overall he is moving each extremity well and needed mod A to partially roll to the L. Due to the deficits listed below the pt also needs up to total A for LB ADLs, pt had incontinent BM at bed level, and up to mod A for UB ADLs at bed level. Pt will benefit from continued acute OT services and skilled inpatient follow up therapy, <3 hours/day (pt and family may declined skilled)      If plan is discharge home, recommend the following:   A lot of help with walking and/or transfers;A lot of help with bathing/dressing/bathroom;Assistance with cooking/housework;Assist for transportation;Help with stairs or ramp for entrance     Functional Status Assessment   Patient has had a recent decline in their functional status and demonstrates the ability to make significant improvements in function in a reasonable and predictable  amount of time.     Equipment Recommendations   Other (comment) (pending acute progress)      Precautions/Restrictions   Precautions Precautions: Fall Restrictions Weight Bearing Restrictions Per Provider Order: No     Mobility Bed Mobility Overal bed mobility: Needs Assistance Bed Mobility: Rolling           General bed mobility comments: partially rolled to the L, this therapist noted new bleeding at the CVC site. returned supine.    Transfers                ADL either performed or assessed with clinical judgement   ADL Overall ADL's : Needs assistance/impaired Eating/Feeding: Set up;Sitting   Grooming: Minimal assistance;Sitting   Upper Body Bathing: Maximal assistance;Bed level   Lower Body Bathing: Total assistance;Bed level   Upper Body Dressing : Maximal assistance;Bed level   Lower Body Dressing: Total assistance;Bed level   Toilet Transfer: Total assistance Toilet Transfer Details (indicate cue type and reason): incontinent BM at bed level Toileting- Clothing Manipulation and Hygiene: Total assistance;Bed level       Functional mobility during ADLs: Moderate assistance General ADL Comments: limited assessment due to bleeding at new CVC site. pt seemingly moving with mod A at bed level. he had an incontinent BM at bed level. RN reports he likes to sit EOB for meals, does this with a little assist     Vision         Perception         Praxis         Pertinent Vitals/Pain Pain Assessment Pain Assessment: No/denies pain  Extremity/Trunk Assessment Upper Extremity Assessment Upper Extremity Assessment: LUE deficits/detail;RUE deficits/detail RUE Deficits / Details: limited assessment due to bleeding noted at new CVC site LUE Deficits / Details: chronic shoulder pain and limited ROM from a prior fall           Communication Communication Communication: No apparent difficulties   Cognition Arousal: Alert Behavior  During Therapy: WFL for tasks assessed/performed Cognition: Cognition impaired   Orientation impairments: Situation Awareness: Intellectual awareness impaired Memory impairment (select all impairments): Short-term memory, Working memory Attention impairment (select first level of impairment): Sustained attention Executive functioning impairment (select all impairments): Sequencing, Reasoning, Problem solving OT - Cognition Comments: disoriented to situation. unable to provide PLOF. followed some simple 1 step commands                 Following commands: Impaired Following commands impaired: Follows one step commands inconsistently     Cueing  General Comments   Cueing Techniques: Verbal cues  VSS. Need bleeding noted at CVC site, applied pressure and notified RN, PA present at the end of the session to address bleed           Home Living Family/patient expects to be discharged to:: Private residence Living Arrangements: Spouse/significant other Available Help at Discharge: Family;Available 24 hours/day Type of Home: House Home Access: Stairs to enter [employer] of Steps: 1+1 small steps   Home Layout: Two level;1/2 bath on main level;Bed/bath upstairs Alternate Level Stairs-Number of Steps: flight Alternate Level Stairs-Rails: Right;Left Bathroom Shower/Tub: Chief Strategy Officer: Standard     Home Equipment: Grab bars - tub/shower;Cane - single Librarian, academic (2 wheels)          Prior Functioning/Environment Prior Level of Function : Independent/Modified Independent;Driving             Mobility Comments: assist for mobility from family, pt uses RW. multiple recent falls per wife ADLs Comments: sponge bathes, assist from famiyl for ADLs as needed    OT Problem List: Decreased strength;Decreased range of motion;Decreased activity tolerance;Impaired balance (sitting and/or standing);Decreased cognition;Decreased safety  awareness;Decreased knowledge of use of DME or AE;Decreased knowledge of precautions;Pain   OT Treatment/Interventions: Self-care/ADL training;Therapeutic exercise;DME and/or AE instruction;Therapeutic activities;Patient/family education;Balance training      OT Goals(Current goals can be found in the care plan section)   Acute Rehab OT Goals Patient Stated Goal: per wife, pt home OT Goal Formulation: With patient Time For Goal Achievement: 07/18/24 Potential to Achieve Goals: Good ADL Goals Pt Will Perform Grooming: with modified independence;standing Pt Will Perform Upper Body Dressing: with modified independence;sitting Pt Will Perform Lower Body Dressing: with min assist;sit to/from stand Pt Will Transfer to Toilet: with min assist;ambulating Additional ADL Goal #1: Pt will complete bed mobility with min A as a precursor to ADLs   OT Frequency:  Min 2X/week    Co-evaluation              AM-PAC OT 6 Clicks Daily Activity     Outcome Measure Help from another person eating meals?: A Little Help from another person taking care of personal grooming?: A Little Help from another person toileting, which includes using toliet, bedpan, or urinal?: Total Help from another person bathing (including washing, rinsing, drying)?: A Lot Help from another person to put on and taking off regular upper body clothing?: A Lot Help from another person to put on and taking off regular lower body clothing?: Total 6 Click Score: 12   End of Session  Nurse Communication: Mobility status;Other (comment) (bleeding at CVC site)  Activity Tolerance: Patient tolerated treatment well Patient left: in bed;with call bell/phone within reach (PA present)  OT Visit Diagnosis: Unsteadiness on feet (R26.81);Other abnormalities of gait and mobility (R26.89);Muscle weakness (generalized) (M62.81)                Time: 8657-8584 OT Time Calculation (min): 33 min Charges:  OT General Charges $OT Visit: 1  Visit OT Evaluation $OT Eval Moderate Complexity: 1 Mod  Lucie Kendall, OTR/L Acute Rehabilitation Services Office [phone] Secure Chat Communication Preferred   Lucie JONETTA Kendall 07/04/2024, 3:19 PM

## 2024-07-04 NOTE — Progress Notes (Addendum)
 This chaplain is present for F/U spiritual care in the setting of completing the Pt. Advance Directive:  HCPOA only.   The Pt. is awake and answers questions appropriately. The chaplain listened reflectively as the  Pt. verbalized items he wants to take care of before EOL.   The Pt. is not completing a LW. The Pt. son called during Pt. education. The chaplain understands [name] will visit later today. The Pt. is able to answer clarifying questions and continues to choose [facility] as his healthcare agent.   **1217 Pt. left for IR.  **1315 Pt. out of room.  **1550 The chaplain is present with the Pt., notary, and witnesses for the notarizing of the Pt. AD. The chaplain gave the Pt. the original AD along with one copy. The chaplain scanned the Pt. AD into the Pt. EMR.  The chaplain is available for F/U spiritual care as needed.  Chaplain Leeroy Hummer [phone]

## 2024-07-05 DIAGNOSIS — Z936 Other artificial openings of urinary tract status: Secondary | ICD-10-CM | POA: Diagnosis not present

## 2024-07-05 DIAGNOSIS — C689 Malignant neoplasm of urinary organ, unspecified: Secondary | ICD-10-CM | POA: Diagnosis not present

## 2024-07-05 DIAGNOSIS — Z7189 Other specified counseling: Secondary | ICD-10-CM | POA: Diagnosis not present

## 2024-07-05 DIAGNOSIS — Z66 Do not resuscitate: Secondary | ICD-10-CM

## 2024-07-05 DIAGNOSIS — R531 Weakness: Secondary | ICD-10-CM | POA: Diagnosis not present

## 2024-07-05 DIAGNOSIS — Z515 Encounter for palliative care: Secondary | ICD-10-CM | POA: Diagnosis not present

## 2024-07-05 DIAGNOSIS — I10 Essential (primary) hypertension: Secondary | ICD-10-CM | POA: Diagnosis not present

## 2024-07-05 DIAGNOSIS — A419 Sepsis, unspecified organism: Secondary | ICD-10-CM | POA: Diagnosis not present

## 2024-07-05 LAB — CULTURE, BLOOD (ROUTINE X 2)
Culture: NO GROWTH
Culture: NO GROWTH
Special Requests: ADEQUATE
Special Requests: ADEQUATE

## 2024-07-05 LAB — GLUCOSE, CAPILLARY
Glucose-Capillary: 129 mg/dL — ABNORMAL HIGH (ref 70–99)
Glucose-Capillary: 140 mg/dL — ABNORMAL HIGH (ref 70–99)
Glucose-Capillary: 162 mg/dL — ABNORMAL HIGH (ref 70–99)
Glucose-Capillary: 164 mg/dL — ABNORMAL HIGH (ref 70–99)
Glucose-Capillary: 177 mg/dL — ABNORMAL HIGH (ref 70–99)
Glucose-Capillary: 79 mg/dL (ref 70–99)
Glucose-Capillary: 84 mg/dL (ref 70–99)

## 2024-07-05 MED ORDER — INSULIN ASPART 100 UNIT/ML IJ SOLN
0.0000 [IU] | Freq: Three times a day (TID) | INTRAMUSCULAR | Status: DC
  Administered 2024-07-05: 1 [IU] via SUBCUTANEOUS
  Administered 2024-07-06 (×3): 2 [IU] via SUBCUTANEOUS
  Administered 2024-07-07 (×2): 1 [IU] via SUBCUTANEOUS
  Administered 2024-07-07 – 2024-07-09 (×4): 2 [IU] via SUBCUTANEOUS
  Administered 2024-07-09 (×2): 3 [IU] via SUBCUTANEOUS
  Administered 2024-07-10: 7 [IU] via SUBCUTANEOUS
  Administered 2024-07-10: 2 [IU] via SUBCUTANEOUS
  Administered 2024-07-10: 3 [IU] via SUBCUTANEOUS

## 2024-07-05 NOTE — Progress Notes (Signed)
 [facility]                                                                               Adam Harris, is a 78 y.o. male, DOB - 12/05/1945, FMW:[id number] Admit date - 06/27/2024    Outpatient Primary MD for the patient is Rudd, Garnette HERO, MD  LOS - 7  days    Brief summary   Adam Harris is a 78 y.o. 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   Assessment & Plan    Assessment and Plan:  Sepsis secondary to MRSA bacteremia and Enterococcus faecalis infection from complicated urinary tract infection ID on board and managing antibiotics.  Patient afebrile and wbc has normalized.  TEE  did not show any endocarditis.  IR consulted for central venous access for prolonged antibiotics.    He underwent right IJ tunneled dual-lumen power injectable catheter with ultrasound and fluoroscopic guidance.    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 Pain not well controlled, added oxy back to his regimen.    Hypertension Sub optimal. Probably from uncontrolled pain.  Patient currently on 15 mg of Imdur , 25 mg of Lopressor  3 times daily.  Hydralazine  added.   Type 2 diabetes mellitus with hyperglycemia.   Recent Labs    07/05/24 0415 07/05/24 0904 07/05/24 1157  GLUCAP 84 129* 162*  Continue with sliding scale insulin  No changes.  Microcytic anemia Hemoglobin stable around 10.   Heart failure with preserved left ventricular ejection fraction Last echo showed left ventricular ejection fraction of 60 to 65%. He  appears euvolemic.   Permanent atrial fibrillation Not on anticoagulation.  Rate controlled with metoprolol   Acute kidney injury with metabolic acidosis secondary to sepsis Creatinine slowly improving.   In view of his poor functional status, palliative care consulted for goals of care. Discussions ongoing.  Recommended to follow u with Dr Lanny as outpatient.    Estimated body mass index is 26.9 kg/m as calculated from the following:   Height as of this encounter: 5' 11 (1.803 m).   Weight as of this encounter: 87.5 kg.  Code Status: dnr DVT Prophylaxis:  enoxaparin  (LOVENOX ) injection 40 mg Start: 06/28/24 1000   Level of Care: Level of care: Telemetry Medical Family Communication: none at bedside.  Disposition Plan:     Remains inpatient appropriate:  pending clinical improvement.   Procedures:  TEE  Consultants:   Cardiology Palliative care  ID  Urology.   Antimicrobials:   Anti-infectives (From admission, onward)    Start     Dose/Rate Route Frequency Ordered Stop   07/01/24 1400  DAPTOmycin  (CUBICIN ) IVPB 700 mg/100mL premix  Status:  Discontinued        700 mg 200 mL/hr over 30 Minutes Intravenous Every 48 hours 06/30/24 1445 07/01/24 0955  07/01/24 1045  DAPTOmycin  (CUBICIN ) IVPB 700 mg/161mL premix  Status:  Discontinued        700 mg 200 mL/hr over 30 Minutes Intravenous Daily 07/01/24 0955 07/01/24 1028   07/01/24 1045  DAPTOmycin  (CUBICIN ) 900 mg in sodium chloride  0.9 % IVPB        900 mg 136 mL/hr over 30 Minutes Intravenous Daily 07/01/24 1028     06/29/24 2000  vancomycin  (VANCOCIN ) IVPB 1000 mg/200 mL premix  Status:  Discontinued        1,000 mg 200 mL/hr over 60 Minutes Intravenous Every 24 hours 06/28/24 1922 06/29/24 1543   06/29/24 1700  cefTRIAXone  (ROCEPHIN ) 1 g in sodium chloride  0.9 % 100 mL IVPB  Status:  Discontinued        1 g 200 mL/hr over 30 Minutes Intravenous Every 24 hours 06/29/24 1551 06/30/24 1154   06/29/24 1700   DAPTOmycin  (CUBICIN ) IVPB 700 mg/12mL premix  Status:  Discontinued        700 mg 200 mL/hr over 30 Minutes Intravenous Daily 06/29/24 1552 06/30/24 1445   06/28/24 2015  vancomycin  (VANCOREADY) IVPB 2000 mg/400 mL        2,000 mg 200 mL/hr over 120 Minutes Intravenous  Once 06/28/24 1922 06/29/24 0903   06/28/24 0845  piperacillin -tazobactam (ZOSYN ) IVPB 3.375 g  Status:  Discontinued        3.375 g 12.5 mL/hr over 240 Minutes Intravenous Every 8 hours 06/28/24 0844 06/29/24 1543   06/28/24 0200  piperacillin -tazobactam (ZOSYN ) IVPB 3.375 g        3.375 g 100 mL/hr over 30 Minutes Intravenous  Once 06/28/24 0154 06/28/24 0345        Medications  Scheduled Meds:  artificial tears  2 drop Both Eyes QID   aspirin  EC  81 mg Oral Daily   calcium  carbonate  1 tablet Oral TID WC   Chlorhexidine  Gluconate Cloth  6 each Topical Daily   enoxaparin  (LOVENOX ) injection  40 mg Subcutaneous Q24H   feeding supplement  237 mL Oral BID BM   Gerhardt's butt cream   Topical BID   insulin  aspart  0-9 Units Subcutaneous TID WC   isosorbide  mononitrate  15 mg Oral Daily   linaclotide   145 mcg Oral QAC breakfast   metoprolol  tartrate  25 mg Oral TID   mirtazapine   15 mg Oral QHS   sodium bicarbonate   650 mg Oral BID   sodium chloride  flush  3 mL Intravenous Q12H   tamsulosin   0.4 mg Oral Daily   Continuous Infusions:  DAPTOmycin  900 mg (07/05/24 1511)   PRN Meds:.acetaminophen  **OR** acetaminophen , albuterol , hydrOXYzine , metoprolol  tartrate, ondansetron  **OR** ondansetron  (ZOFRAN ) IV, oxyCODONE , polyethylene glycol    Subjective:   Davell Beckstead was seen and examined today.  Moaning. In pain.    Objective:   Vitals:   07/05/24 0352 07/05/24 0418 07/05/24 0742 07/05/24 1510  BP: 115/79 (!) 149/87 (!) 147/105 (!) 167/99  Pulse: 87 76 96 93  Resp:  18 17 17   Temp: 97.8 F (36.6 C) 97.7 F (36.5 C) 98.1 F (36.7 C)   TempSrc: Oral Oral    SpO2: 94% 95% 98% 95%  Weight:      Height:         Intake/Output Summary (Last 24 hours) at 07/05/2024 1523 Last data filed at 07/05/2024 1500 Gross per 24 hour  Intake 720 ml  Output 700 ml  Net 20 ml   Filed Weights   07/01/24 0500 07/02/24 9278  07/03/24 0708  Weight: 91.5 kg 87.5 kg 87.5 kg     Exam General exam: ill appearing gentleman, not in distress on RA. Respiratory system: air entry fair.  Cardiovascular system: S1 & S2 heard, RRR.  Gastrointestinal system: Abdomen is nondistended, soft and nontender.  Central nervous system: lethargic, but answering to questions.  Extremities: no edema.  Skin: No rashes,  Psychiatry:  Mood & affect appropriate.        Data Reviewed:  I have personally reviewed following labs and imaging studies   CBC Lab Results  Component Value Date   WBC 10.2 07/04/2024   RBC 4.36 07/04/2024   HGB 10.1 (L) 07/04/2024   HCT 32.6 (L) 07/04/2024   MCV 74.8 (L) 07/04/2024   MCH 23.2 (L) 07/04/2024   PLT 241 07/04/2024   MCHC 31.0 07/04/2024   RDW 21.1 (H) 07/04/2024   LYMPHSABS 1.1 07/04/2024   MONOABS 0.9 07/04/2024   EOSABS 0.1 07/04/2024   BASOSABS 0.1 07/04/2024     Last metabolic panel Lab Results  Component Value Date   NA 141 07/04/2024   K 4.2 07/04/2024   CL 117 (H) 07/04/2024   CO2 19 (L) 07/04/2024   BUN 32 (H) 07/04/2024   CREATININE 1.87 (H) 07/04/2024   GLUCOSE 108 (H) 07/04/2024   GFRNONAA 37 (L) 07/04/2024   GFRAA 32 (L) 08/05/2020   CALCIUM  8.7 (L) 07/04/2024   PHOS 4.2 08/06/2023   PROT 7.0 07/01/2024   ALBUMIN  1.8 (L) 07/01/2024   BILITOT 1.3 (H) 07/01/2024   ALKPHOS 130 (H) 07/01/2024   AST 41 07/01/2024   ALT 23 07/01/2024   ANIONGAP 16 (H) 07/04/2024    CBG (last 3)  Recent Labs    07/05/24 0415 07/05/24 0904 07/05/24 1157  GLUCAP 84 129* 162*      Coagulation Profile: No results for input(s): INR, PROTIME in the last 168 hours.    Radiology Studies: IR TUNNELED CENTRAL VENOUS CATH [facility] W IMG Result Date:  07/04/2024 CLINICAL DATA:  Pneumonia, bladder carcinoma, needs durable venous access for planned antibiotic regimen EXAM: TUNNELED CENTRAL VENOUS CATHETER PLACEMENT WITH ULTRASOUND AND FLUOROSCOPIC GUIDANCE TECHNIQUE: The procedure, risks, benefits, and alternatives were explained to the patient. Questions regarding the procedure were encouraged and answered. The patient understands and consents to the procedure. Patency of the right IJ vein was confirmed with ultrasound with image documentation. An appropriate skin site was determined. Region was prepped using maximum barrier technique including cap and mask, sterile gown, sterile gloves, large sterile sheet, and Chlorhexidine  as cutaneous antisepsis. The region was infiltrated locally with 1% lidocaine . Under real-time ultrasound guidance, the right IJ vein was accessed with a 21 gauge micropuncture needle; the needle tip within the vein was confirmed with ultrasound image documentation. 44F dual-lumen cuffed PowerLine tunneled from a right anterior chest wall approach to the dermatotomy site. Needle exchanged over the 018 guidewire for transitional dilator, through which the catheter which had been cut to 24 cm was advanced under intermittent fluoroscopy, positioned with its tip at the cavoatrial junction. Spot chest radiograph confirms good catheter position. No pneumothorax. Catheter was flushed per protocol. Catheter secured externally with O Prolene suture. The right IJ dermatotomy site was closed with Dermabond. COMPLICATIONS: COMPLICATIONS None immediate FLUOROSCOPY TIME:  Radiation Exposure Index (as provided by the fluoroscopic device): 1.3 mGy air Kerma COMPARISON:  None Available. IMPRESSION: 1. Technically successful placement of tunneled right IJ tunneled dual-lumen power injectable catheter with ultrasound and fluoroscopic guidance. Ready for routine use. Electronically  Signed   By: JONETTA Faes M.D.   On: 07/04/2024 15:30   US  EKG SITE RITE Result  Date: 07/03/2024 If [facility] image not attached, placement could not be confirmed due to current cardiac rhythm.      Elgie Butter M.D. [facility] 07/05/2024, 3:23 PM  Available via Epic secure chat 7am-7pm After 7 pm, please refer to night coverage provider listed on amion.

## 2024-07-05 NOTE — Progress Notes (Signed)
 Daily Progress Note   Patient Name: Adam Harris       Date: 07/05/2024 DOB: 1945-12-13  Age: 78 y.o. MRN#: [id number] Attending Physician: Adam Labella, MD Primary Care Physician: Adam Garnette HERO, MD Admit Date: 06/27/2024  Reason for Consultation/Follow-up: Establishing goals of care  Subjective: Feels okay - denies pain, no complaints  Length of Stay: 7  Current Medications: Scheduled Meds:   artificial tears  2 drop Both Eyes QID   aspirin  EC  81 mg Oral Daily   calcium  carbonate  1 tablet Oral TID WC   Chlorhexidine  Gluconate Cloth  6 each Topical Daily   enoxaparin  (LOVENOX ) injection  40 mg Subcutaneous Q24H   feeding supplement  237 mL Oral BID BM   Gerhardt's butt cream   Topical BID   insulin  aspart  0-9 Units Subcutaneous Q4H   isosorbide  mononitrate  15 mg Oral Daily   linaclotide   145 mcg Oral QAC breakfast   metoprolol  tartrate  25 mg Oral TID   mirtazapine   15 mg Oral QHS   sodium bicarbonate   650 mg Oral BID   sodium chloride  flush  3 mL Intravenous Q12H   tamsulosin   0.4 mg Oral Daily    Continuous Infusions:  DAPTOmycin  900 mg (07/04/24 1602)    PRN Meds: acetaminophen  **OR** acetaminophen , albuterol , hydrOXYzine , metoprolol  tartrate, ondansetron  **OR** ondansetron  (ZOFRAN ) IV, oxyCODONE , polyethylene glycol  Physical Exam Constitutional:      General: He is not in acute distress.    Appearance: He is ill-appearing.  Pulmonary:     Effort: Pulmonary effort is normal.  Skin:    General: Skin is warm and dry.             Vital Signs: BP (!) 147/105 (BP Location: Left Arm)   Pulse 96   Temp 98.1 F (36.7 C)   Resp 17   Ht 5' 11 (1.803 m)   Wt 87.5 kg   SpO2 98%   BMI 26.90 kg/m  SpO2: SpO2: 98 % O2 Device: O2 Device: Room Air O2 Flow Rate: O2 Flow Rate  (L/min): 0 L/min  Intake/output summary:  Intake/Output Summary (Last 24 hours) at 07/05/2024 1453 Last data filed at 07/05/2024 0920 Gross per 24 hour  Intake 480 ml  Output 700 ml  Net -220 ml   LBM: Last BM Date : 07/02/24 Baseline Weight: Weight: 89.8 kg Most recent weight: Weight: 87.5 kg       Palliative Assessment/Data: PPS 40%      Patient Active Problem List   Diagnosis Date Noted   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   Palliative care by specialist 07/02/2024   DNR (do not resuscitate) discussion 07/02/2024   Fall 06/28/2024   Sepsis secondary to UTI (HCC) 06/28/2024   Microcytic anemia 06/28/2024   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) 06/27/2024   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) 06/27/2024   Acute delirium 06/27/2024   Pressure injury of left buttock, stage 3 (HCC) 06/19/2024   Moderate protein-calorie malnutrition (HCC) 06/19/2024   Opioid-induced constipation 06/19/2024   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) 06/04/2024   Generalized weakness 05/25/2024   Complicated UTI (urinary tract infection) 05/25/2024   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) 05/25/2024   Nephrostomy present (HCC) 04/01/2024   AKI (acute kidney injury) (HCC) 03/08/2024   Tachyarrhythmia 03/07/2024   Acute blood loss anemia 03/07/2024   Dysuria 01/25/2024   Presence of Watchman left atrial appendage closure device 12/13/2023   Permanent atrial fibrillation (HCC) 11/29/2023   Hx of hematuria 11/29/2023   Lower urinary tract symptoms (LUTS) 04/06/2023   CKD (chronic kidney disease), stage IV (HCC) 03/22/2023   Prostate cancer (HCC) 06/21/2022   Cataract cortical, senile, left 12/13/2021   Diabetic peripheral neuropathy (HCC) 12/13/2021   Hydrocele- Left 12/02/2021   Solitary kidney, acquired 10/26/2021   Hypertensive renal disease 10/26/2021   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   Chronic diastolic CHF (congestive heart failure), NYHA class 2 (HCC) 04/02/2017   Atrial fibrillation with RVR (HCC) 11/20/2016   OA (osteoarthritis) of knee 05/31/2016   Low back pain 05/31/2016   Exertional angina (HCC) 10/29/2014   Chronic kidney disease, stage 3b (HCC) 10/29/2014   Coronary artery disease involving native coronary artery of native heart with angina pectoris (HCC) 03/09/2014   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/2014   Hyperlipidemia 02/05/2014   History of non-ST elevation myocardial infarction (NSTEMI) 02/03/2014   Essential hypertension 02/03/2014   Anxiety state 04/19/2013   History of prostate cancer 04/19/2013   Insomnia 04/19/2013   Stiffness of joint, lower leg 04/19/2013   Sciatica 04/19/2013    Palliative Care Assessment & Plan   HPI: 78 yo male with 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 Patient received first round of chemotherapy on 8/13 and presented on 8/15 for evaluation of a fall and confusion. Diagnosed with sepsis secondary to MRSA bacteremia and Enterococcus faecalis infection from complicated urinary tract infection. PMT consulted to discuss GOC.  Assessment: Follow up today with patient. Visited him twice throughout the day.  Initially he is sleeping, wakes easily to voice. Denies complaints. Agrees to me calling son.  Call to Adam Harris. We review that the [employer] documentation has been completed and he is now HCPOA. He affirms his desire to make decisions for his dad that he would make for himself.  He tells me he was unable to speak with patient yesterday evening regarding his wishes as the patient was too drowsy and he didn't want to upset the family members that were present. He tells me he will be coming this afternoon and agrees to speak then.   Adam Harris at bedside to speak  with Adam Harris. We review the goal to continue current plan of care with hopes of improvement and returning home.   We review that he has previously expressed his wishes multiple times for DNR/DNI but this has not been changed yet. He expresses some frustration that this topic is being brought up again - telling me its already been handled. We discuss that it has not officially been changed yet as there was some concern that more conversation was needed. Together with patient and his son we review full code vs DNR. He again confirms his desire for DNR/DNI. Harris agrees he supports this wish. We review this does not change current care; only protects him from aggressive medical care in the event of cardiac arrest and protects him from intubation which he also expresses he does not want.   Harris shares concerns about his nutrition - we  review patient has been declining Ensures. Discussed with nursing - they will bring them to him. Encouraged patient to sip on them as much as he can tolerate.    Recommendations/Plan: Confirmed DNR/DNI with patient and son Youth worker) Otherwise, continue current measures and allow time for outcomes  Care plan was discussed with patient, son, RN  Thank you for allowing the Palliative Medicine Team to assist in the care of this patient.   Total Time 60 minutes Prolonged Time Billed  No   Time spent includes: Detailed review of medical records (labs, imaging, vital signs), medically appropriate exam, discussion with treatment team, counseling and educating patient, family and/or staff, documenting clinical information, medication management and coordination of care.     *Please note that this is a verbal dictation therefore any spelling or grammatical errors are due to the Dragon [employer] One system interpretation.  Tobey Jama Barnacle, DNP, [facility] Palliative Medicine Team Team Phone # [phone]  Pager [phone]

## 2024-07-05 NOTE — Evaluation (Signed)
 Physical Therapy Evaluation Patient Details Name: Adam Harris MRN: [id number] DOB: August 21, 1946 Today's Date: 07/05/2024  History of Present Illness  Adam Harris is a 78 y.o. male who presented 06/27/24 with a fall and confusion after a recent nephrostomy replacement. Admitted for aepsis secondary to MRSA bacteremia and Enterococcus faecalis infection from UTI. 8/20 TEE. 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   Clinical Impression  Pt admitted with above diagnosis. Examination limited by impaired cognition, pt being a poor historian, and lack of family present. Obtained PLOF and home set-up via chart review. PTA, pt required assist with functional mobility using RW and ADLs/IADLs. He lives with his wife in a one story house with 2 STE. Pt currently with functional limitations due to the deficits listed below (see PT Problem List). He required CGA for bed mobility, minA for transfers using RW, and CGA for gait using RW. Pt was alert, oriented to himself only. He is currently limited by short-term memory deficits, poor safety awareness, decreased activity tolerance, and SOB/DOE. Pt will benefit from acute skilled PT to increase his independence and safety with mobility to allow discharge. Recommend continued inpatient follow up therapy, <3 hours/day.    If plan is discharge home, recommend the following: A little help with walking and/or transfers;A little help with bathing/dressing/bathroom;Assistance with cooking/housework;Assist for transportation;Help with stairs or ramp for entrance;Supervision due to cognitive status   Can travel by private vehicle   Yes    Equipment Recommendations None recommended by PT  Recommendations for Other Services       Functional Status Assessment Patient has had a recent decline in their functional status and demonstrates the ability  to make significant improvements in function in a reasonable and predictable amount of time.     Precautions / Restrictions Precautions Precautions: Fall Recall of Precautions/Restrictions: Impaired Restrictions Weight Bearing Restrictions Per Provider Order: No      Mobility  Bed Mobility Overal bed mobility: Needs Assistance Bed Mobility: Supine to Sit     Supine to sit: HOB elevated, Used rails, Contact guard     General bed mobility comments: Pt sat up on L side of bed with increased time. CGA to elevate trunk and scoot hips forward.    Transfers Overall transfer level: Needs assistance Equipment used: Rolling walker (2 wheels) Transfers: Sit to/from Stand, Bed to chair/wheelchair/BSC Sit to Stand: From elevated surface, Min assist   Step pivot transfers: From elevated surface, Min assist       General transfer comment: Pt stood from raised bed height. Cues for proper hand placement using RW. Powered up with minA. Transferred to recliner chair on left. Good eccentric control.    Ambulation/Gait Ambulation/Gait assistance: Contact guard assist Gait Distance (Feet): 20 Feet Assistive device: Rolling walker (2 wheels) Gait Pattern/deviations: Step-through pattern, Decreased stride length, Drifts right/left Gait velocity: decr     General Gait Details: Pt ambulated within room by taking slow steps. Cues for improved sequencing and safety awareness. Pt attempted to lift AD off floor. Educated pt to maintain all four points in contact with the ground at all times. He demonstrated SOB/DOE, but SpO2 88-91% on RA.  Stairs            Wheelchair Mobility     Tilt Bed    Modified Rankin (Stroke Patients Only)       Balance Overall balance assessment: Needs assistance, History  of Falls Sitting-balance support: Bilateral upper extremity supported, Feet supported Sitting balance-Leahy Scale: Fair     Standing balance support: Bilateral upper extremity  supported, During functional activity, Reliant on assistive device for balance Standing balance-Leahy Scale: Poor Standing balance comment: Pt dependent on RW                             Pertinent Vitals/Pain Pain Assessment Pain Assessment: Faces Faces Pain Scale: Hurts little more Pain Location: L buttocks Pain Descriptors / Indicators: Discomfort, Grimacing, Aching, Sore, Tender Pain Intervention(s): Monitored during session, Limited activity within patient's tolerance, Repositioned    Home Living Family/patient expects to be discharged to:: Private residence Living Arrangements: Spouse/significant other Available Help at Discharge: Family;Available 24 hours/day Type of Home: House Home Access: Stairs to enter   Entrance Stairs-Number of Steps: 1+1 small steps Alternate Level Stairs-Number of Steps: flight Home Layout: Two level;1/2 bath on main level;Bed/bath upstairs Home Equipment: Grab bars - tub/shower;Cane - single Librarian, academic (2 wheels) Additional Comments: Above information obtained from OT eval where family was present. Per chart review, pt was recently discharged from SNF to Home.    Prior Function Prior Level of Function : Patient poor historian/Family not available;Needs assist;History of Falls (last six months) (Below information obtained from OT eval where family was present.)             Mobility Comments: Per report of family, pt needed assist for mobility with a RW. He has had several falls lately. Per chart review, back in July 2025 pt was independent ADLs Comments: Per report of family, pt needed assist for ADLs. He sponge bathes. Per chart review, back in July 2025 pt was independent     Extremity/Trunk Assessment        Lower Extremity Assessment Lower Extremity Assessment: Generalized weakness       Communication   Communication Communication: No apparent difficulties    Cognition Arousal: Alert Behavior During  Therapy: Flat affect   PT - Cognitive impairments: No family/caregiver present to determine baseline, Orientation, Awareness, Problem solving, Safety/Judgement, Memory   Orientation impairments: Place, Time, Situation (Pt unsure as to where he is, the date, or why he is here. Re-oriented with no carryover later in session.)                   PT - Cognition Comments: Pt was alert, oriented to self only. He was pleasantly confused throughout session. Short term memory deficits. Educated pt on how to use call bell, when asked later, unable to report proper steps/buttons. Following commands: Impaired Following commands impaired: Follows one step commands inconsistently     Cueing Cueing Techniques: Verbal cues, Gestural cues     General Comments General comments (skin integrity, edema, etc.): VSS on RA    Exercises     Assessment/Plan    PT Assessment Patient needs continued PT services  PT Problem List Decreased strength;Decreased activity tolerance;Decreased balance;Decreased mobility;Decreased knowledge of use of DME;Decreased safety awareness       PT Treatment Interventions DME instruction;Gait training;Stair training;Functional mobility training;Therapeutic activities;Therapeutic exercise;Balance training;Patient/family education    PT Goals (Current goals can be found in the Care Plan section)  Acute Rehab PT Goals Patient Stated Goal: Return Home PT Goal Formulation: With patient Time For Goal Achievement: 07/19/24 Potential to Achieve Goals: Fair    Frequency Min 2X/week     Co-evaluation  AM-PAC PT 6 Clicks Mobility  Outcome Measure Help needed turning from your back to your side while in a flat bed without using bedrails?: A Little Help needed moving from lying on your back to sitting on the side of a flat bed without using bedrails?: A Little Help needed moving to and from a bed to a chair (including a wheelchair)?: A Little Help  needed standing up from a chair using your arms (e.g., wheelchair or bedside chair)?: A Little Help needed to walk in hospital room?: A Little Help needed climbing 3-5 steps with a railing? : A Lot 6 Click Score: 17    End of Session Equipment Utilized During Treatment: Gait belt Activity Tolerance: Patient tolerated treatment well;Patient limited by fatigue Patient left: in chair;with call bell/phone within reach;with chair alarm set Nurse Communication: Mobility status PT Visit Diagnosis: History of falling (Z91.81);Repeated falls (R29.6);Unsteadiness on feet (R26.81);Other abnormalities of gait and mobility (R26.89)    Time: 9183-9162 PT Time Calculation (min) (ACUTE ONLY): 21 min   Charges:   PT Evaluation $PT Eval Moderate Complexity: 1 Mod   PT General Charges $$ ACUTE PT VISIT: 1 Visit         Randall SAUNDERS, PT, DPT Acute Rehabilitation Services Office: [phone] Secure Chat Preferred  Delon CHRISTELLA Callander 07/05/2024, 9:53 AM

## 2024-07-06 ENCOUNTER — Other Ambulatory Visit: Payer: Self-pay

## 2024-07-06 DIAGNOSIS — A419 Sepsis, unspecified organism: Secondary | ICD-10-CM | POA: Diagnosis not present

## 2024-07-06 DIAGNOSIS — Z515 Encounter for palliative care: Secondary | ICD-10-CM | POA: Diagnosis not present

## 2024-07-06 DIAGNOSIS — C689 Malignant neoplasm of urinary organ, unspecified: Secondary | ICD-10-CM | POA: Diagnosis not present

## 2024-07-06 DIAGNOSIS — Z7189 Other specified counseling: Secondary | ICD-10-CM | POA: Diagnosis not present

## 2024-07-06 LAB — MIC RESULT

## 2024-07-06 LAB — CBC WITH DIFFERENTIAL/PLATELET
Basophils Absolute: 0.1 K/uL (ref 0.0–0.1)
Basophils Relative: 1 %
Eosinophils Absolute: 0 K/uL (ref 0.0–0.5)
Eosinophils Relative: 0 %
HCT: 33.1 % — ABNORMAL LOW (ref 39.0–52.0)
Hemoglobin: 10.1 g/dL — ABNORMAL LOW (ref 13.0–17.0)
Lymphocytes Relative: 9 %
Lymphs Abs: 0.5 K/uL — ABNORMAL LOW (ref 0.7–4.0)
MCH: 23.3 pg — ABNORMAL LOW (ref 26.0–34.0)
MCHC: 30.5 g/dL (ref 30.0–36.0)
MCV: 76.4 fL — ABNORMAL LOW (ref 80.0–100.0)
Monocytes Absolute: 0.2 K/uL (ref 0.1–1.0)
Monocytes Relative: 4 %
Neutro Abs: 4.4 K/uL (ref 1.7–7.7)
Neutrophils Relative %: 86 %
Platelets: 349 K/uL (ref 150–400)
RBC: 4.33 MIL/uL (ref 4.22–5.81)
RDW: 22.1 % — ABNORMAL HIGH (ref 11.5–15.5)
WBC: 5.1 K/uL (ref 4.0–10.5)
nRBC: 1.6 % — ABNORMAL HIGH (ref 0.0–0.2)

## 2024-07-06 LAB — BASIC METABOLIC PANEL WITH GFR
Anion gap: 11 (ref 5–15)
BUN: 24 mg/dL — ABNORMAL HIGH (ref 8–23)
CO2: 20 mmol/L — ABNORMAL LOW (ref 22–32)
Calcium: 8.3 mg/dL — ABNORMAL LOW (ref 8.9–10.3)
Chloride: 110 mmol/L (ref 98–111)
Creatinine, Ser: 1.85 mg/dL — ABNORMAL HIGH (ref 0.61–1.24)
GFR, Estimated: 37 mL/min — ABNORMAL LOW (ref >60)
Glucose, Bld: 144 mg/dL — ABNORMAL HIGH (ref 70–99)
Potassium: 3.8 mmol/L (ref 3.5–5.1)
Sodium: 141 mmol/L (ref 135–145)

## 2024-07-06 LAB — GLUCOSE, CAPILLARY
Glucose-Capillary: 147 mg/dL — ABNORMAL HIGH (ref 70–99)
Glucose-Capillary: 161 mg/dL — ABNORMAL HIGH (ref 70–99)
Glucose-Capillary: 157 mg/dL — ABNORMAL HIGH (ref 70–99)
Glucose-Capillary: 159 mg/dL — ABNORMAL HIGH (ref 70–99)
Glucose-Capillary: 148 mg/dL — ABNORMAL HIGH (ref 70–99)

## 2024-07-06 LAB — MAGNESIUM: Magnesium: 1.9 mg/dL (ref 1.7–2.4)

## 2024-07-06 LAB — MINIMUM INHIBITORY CONC. (1 DRUG)

## 2024-07-06 MED ORDER — METOPROLOL TARTRATE 50 MG PO TABS
50.0000 mg | ORAL_TABLET | Freq: Two times a day (BID) | ORAL | Status: DC
  Administered 2024-07-06 – 2024-07-10 (×8): 50 mg via ORAL
  Filled 2024-07-06 (×8): qty 1

## 2024-07-06 MED ORDER — METOPROLOL TARTRATE 5 MG/5ML IV SOLN
5.0000 mg | Freq: Once | INTRAVENOUS | Status: AC
  Administered 2024-07-06: 2.5 mg via INTRAVENOUS
  Filled 2024-07-06: qty 5

## 2024-07-06 MED ORDER — LACTATED RINGERS IV SOLN: INTRAVENOUS | Status: AC

## 2024-07-06 MED ORDER — MORPHINE SULFATE (PF) 2 MG/ML IV SOLN
1.0000 mg | INTRAVENOUS | Status: DC | PRN
  Administered 2024-07-06 – 2024-07-09 (×6): 2 mg via INTRAVENOUS
  Filled 2024-07-06 (×6): qty 1

## 2024-07-06 MED ORDER — OXYCODONE HCL 5 MG PO TABS
5.0000 mg | ORAL_TABLET | Freq: Four times a day (QID) | ORAL | Status: DC | PRN
  Administered 2024-07-06 – 2024-07-09 (×5): 5 mg via ORAL
  Filled 2024-07-06 (×7): qty 1

## 2024-07-06 NOTE — Progress Notes (Signed)
 [facility]                                                                               Adam Harris, is a 78 y.o. male, DOB - [birth year], FMW:[id number] Admit date - 06/27/2024    Outpatient Primary MD for the patient is Rudd, Garnette HERO, MD  LOS - 8  days    Brief summary   Adam Harris is a 78 y.o. 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   Assessment & Plan    Assessment and Plan:  Sepsis secondary to MRSA bacteremia and Enterococcus faecalis infection from complicated urinary tract infection ID on board and managing antibiotics.  Patient afebrile and wbc has normalized.  TEE  did not show any endocarditis.  IR consulted for central venous access for prolonged antibiotics.    He underwent right IJ tunneled dual-lumen power injectable catheter with ultrasound and fluoroscopic guidance.    Afib with RVR this am.  Suspect probably from uncontrolled pain. Will increase the oxy to 5 mg prn.  HR in 140's .  Added metoprolol  5 mg IV one dose.  HR in 120/min after the metoprolol  dose.  Will get labs and magnesium  level.  Increase metoprolol  to 50 mg BID.  Repeat Blood cultures in am.    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 Pain control with oxycodone  5 mg every 5 hours prn and morphine  IV.    Hypertension Better controlled.  Patient currently on 15 mg of Imdur , 25 mg of Lopressor  3 times daily.  Hydralazine  added.   Type 2 diabetes mellitus with hyperglycemia.   Recent Labs    07/06/24 0411  07/06/24 0752 07/06/24 1137  GLUCAP 147* 161* 157*  Continue with sliding scale insulin  No changes.  Microcytic anemia Hemoglobin stable around 10.   Heart failure with preserved left ventricular ejection fraction Last echo showed left ventricular ejection fraction of 60 to 65%  Permanent atrial fibrillation Not on anticoagulation.  Rate controlled with metoprolol   Acute kidney injury with metabolic acidosis secondary to sepsis Creatinine slowly improving.   In view of his poor functional status, palliative care consulted for goals of care. Discussions ongoing.  Recommended to follow u with Dr Lanny as outpatient.    Estimated body mass index is 26.9 kg/m as calculated from the following:   Height as of this encounter: 5' 11 (1.803 m).   Weight as of this encounter: 87.5 kg.  Code Status: dnr DVT Prophylaxis:  enoxaparin  (LOVENOX ) injection 40 mg Start: 06/28/24 1000   Level of Care: Level of care: Telemetry Medical Family Communication: none at bedside.  Disposition Plan:     Remains inpatient appropriate:  pending clinical improvement.   Procedures:  TEE  Consultants:   Cardiology Palliative care  ID  Urology.  Antimicrobials:   Anti-infectives (From admission, onward)    Start     Dose/Rate Route Frequency Ordered Stop   07/01/24 1400  DAPTOmycin  (CUBICIN ) IVPB 700 mg/100mL premix  Status:  Discontinued        700 mg 200 mL/hr over 30 Minutes Intravenous Every 48 hours 06/30/24 1445 07/01/24 0955   07/01/24 1045  DAPTOmycin  (CUBICIN ) IVPB 700 mg/100mL premix  Status:  Discontinued        700 mg 200 mL/hr over 30 Minutes Intravenous Daily 07/01/24 0955 07/01/24 1028   07/01/24 1045  DAPTOmycin  (CUBICIN ) 900 mg in sodium chloride  0.9 % IVPB        900 mg 136 mL/hr over 30 Minutes Intravenous Daily 07/01/24 1028     06/29/24 2000  vancomycin  (VANCOCIN ) IVPB 1000 mg/200 mL premix  Status:  Discontinued        1,000 mg 200 mL/hr over 60 Minutes Intravenous  Every 24 hours 06/28/24 1922 06/29/24 1543   06/29/24 1700  cefTRIAXone  (ROCEPHIN ) 1 g in sodium chloride  0.9 % 100 mL IVPB  Status:  Discontinued        1 g 200 mL/hr over 30 Minutes Intravenous Every 24 hours 06/29/24 1551 06/30/24 1154   06/29/24 1700  DAPTOmycin  (CUBICIN ) IVPB 700 mg/100mL premix  Status:  Discontinued        700 mg 200 mL/hr over 30 Minutes Intravenous Daily 06/29/24 1552 06/30/24 1445   06/28/24 2015  vancomycin  (VANCOREADY) IVPB 2000 mg/400 mL        2,000 mg 200 mL/hr over 120 Minutes Intravenous  Once 06/28/24 1922 06/29/24 0903   06/28/24 0845  piperacillin -tazobactam (ZOSYN ) IVPB 3.375 g  Status:  Discontinued        3.375 g 12.5 mL/hr over 240 Minutes Intravenous Every 8 hours 06/28/24 0844 06/29/24 1543   06/28/24 0200  piperacillin -tazobactam (ZOSYN ) IVPB 3.375 g        3.375 g 100 mL/hr over 30 Minutes Intravenous  Once 06/28/24 0154 06/28/24 0345        Medications  Scheduled Meds:  artificial tears  2 drop Both Eyes QID   aspirin  EC  81 mg Oral Daily   calcium  carbonate  1 tablet Oral TID WC   Chlorhexidine  Gluconate Cloth  6 each Topical Daily   enoxaparin  (LOVENOX ) injection  40 mg Subcutaneous Q24H   feeding supplement  237 mL Oral BID BM   Gerhardt's butt cream   Topical BID   insulin  aspart  0-9 Units Subcutaneous TID WC   isosorbide  mononitrate  15 mg Oral Daily   linaclotide   145 mcg Oral QAC breakfast   metoprolol  tartrate  25 mg Oral TID   mirtazapine   15 mg Oral QHS   sodium bicarbonate   650 mg Oral BID   sodium chloride  flush  3 mL Intravenous Q12H   tamsulosin   0.4 mg Oral Daily   Continuous Infusions:  DAPTOmycin  900 mg (07/05/24 1511)   PRN Meds:.acetaminophen  **OR** acetaminophen , albuterol , hydrOXYzine , metoprolol  tartrate, morphine  injection, ondansetron  **OR** ondansetron  (ZOFRAN ) IV, oxyCODONE , polyethylene glycol    Subjective:   Carlis Blanchard was seen and examined today. Alert, reports some pain in theback.     Objective:   Vitals:   07/06/24 0642 07/06/24 0805 07/06/24 0853 07/06/24 0952  BP:  (!) 151/99 (!) 167/92 (!) 144/96  Pulse: (!) 110   (!) 112  Resp:  (!) 26 (!) 26 16  Temp: 97.9 F (36.6 C) 97.8 F (36.6 C)  98.4 F (36.9 C)  TempSrc:  Oral  Axillary  SpO2:  96% 97% 97%  Weight:      Height:        Intake/Output Summary (Last 24 hours) at 07/06/2024 1318 Last data filed at 07/06/2024 1123 Gross per 24 hour  Intake 480 ml  Output 900 ml  Net -420 ml   Filed Weights   07/01/24 0500 07/02/24 0721 07/03/24 0708  Weight: 91.5 kg 87.5 kg 87.5 kg     Exam General exam: Ill appearing gentleman, in miild distress this am  Respiratory system: Clear to auscultation. Respiratory effort normal. Cardiovascular system: S1 & S2 heard, tachycardic, irregularly irregular.  Gastrointestinal system: Abdomen is soft, bs+ Central nervous system: sleeping, but wakes up to answer questions, restless when awake, reports being in pain.  Extremities: Symmetric 5 x 5 power. Skin: No rashes, lesions or ulcers Psychiatry:restless      Data Reviewed:  I have personally reviewed following labs and imaging studies   CBC Lab Results  Component Value Date   WBC 5.1 07/06/2024   RBC 4.33 07/06/2024   HGB 10.1 (L) 07/06/2024   HCT 33.1 (L) 07/06/2024   MCV 76.4 (L) 07/06/2024   MCH 23.3 (L) 07/06/2024   PLT 349 07/06/2024   MCHC 30.5 07/06/2024   RDW 22.1 (H) 07/06/2024   LYMPHSABS PENDING 07/06/2024   MONOABS PENDING 07/06/2024   EOSABS PENDING 07/06/2024   BASOSABS PENDING 07/06/2024     Last metabolic panel Lab Results  Component Value Date   NA 141 07/04/2024   K 4.2 07/04/2024   CL 117 (H) 07/04/2024   CO2 19 (L) 07/04/2024   BUN 32 (H) 07/04/2024   CREATININE 1.87 (H) 07/04/2024   GLUCOSE 108 (H) 07/04/2024   GFRNONAA 37 (L) 07/04/2024   GFRAA 32 (L) 08/05/2020   CALCIUM  8.7 (L) 07/04/2024   PHOS 4.2 08/06/2023   PROT 7.0 07/01/2024   ALBUMIN  1.8 (L)  07/01/2024   BILITOT 1.3 (H) 07/01/2024   ALKPHOS 130 (H) 07/01/2024   AST 41 07/01/2024   ALT 23 07/01/2024   ANIONGAP 16 (H) 07/04/2024    CBG (last 3)  Recent Labs    07/06/24 0411 07/06/24 0752 07/06/24 1137  GLUCAP 147* 161* 157*      Coagulation Profile: No results for input(s): INR, PROTIME in the last 168 hours.    Radiology Studies: No results found.      Elgie Butter M.D. [facility] 07/06/2024, 1:18 PM  Available via Epic secure chat 7am-7pm After 7 pm, please refer to night coverage provider listed on amion.

## 2024-07-06 NOTE — Progress Notes (Signed)
   07/06/24 0952  Vitals  Temp 98.4 F (36.9 C)  Temp Source Axillary  BP (!) 144/96  MAP (mmHg) 112  BP Location Right Arm  BP Method Automatic  Patient Position (if appropriate) Lying  Pulse Rate (!) 112  Resp 16  MEWS COLOR  MEWS Score Color Yellow  Oxygen Therapy  SpO2 97 %  O2 Device Room Air  MEWS Score  MEWS Temp 0  MEWS Systolic 0  MEWS Pulse 2  MEWS RR 0  MEWS LOC 0  MEWS Score 2

## 2024-07-06 NOTE — Progress Notes (Signed)
 Tele Artist of 8 beat run of V-tach. Patient experienced no symptoms. Was sitting up in bed to have a drink. Currently in A-fib with rate of 103. MD Akula secure chatted.

## 2024-07-06 NOTE — Progress Notes (Signed)
   07/06/24 0805  Assess: MEWS Score  Temp 97.8 F (36.6 C)  BP (!) 151/99  MAP (mmHg) 116  ECG Heart Rate (!) 140  Resp (!) 26  Level of Consciousness Alert  SpO2 96 %  O2 Device Room Air  Assess: MEWS Score  MEWS Temp 0  MEWS Systolic 0  MEWS Pulse 3  MEWS RR 2  MEWS LOC 0  MEWS Score 5  MEWS Score Color Red  Assess: if the MEWS score is Yellow or Red  Were vital signs accurate and taken at a resting state? Yes  Does the patient meet 2 or more of the SIRS criteria? Yes  Does the patient have a confirmed or suspected source of infection? Yes  MEWS guidelines implemented  Yes, red  Treat  MEWS Interventions Considered administering scheduled or prn medications/treatments as ordered  Take Vital Signs  Increase Vital Sign Frequency  Red: Q1hr x2, continue Q4hrs until patient remains green for 12hrs  Escalate  MEWS: Escalate Red: Discuss with charge nurse and notify provider. Consider notifying RRT. If remains red for 2 hours consider need for higher level of care  Notify: Charge Nurse/RN  Name of Charge Nurse/RN Notified Investment banker, operational  Provider Notification  Provider Name/Title Dr. Elgie Butter  Date Provider Notified 07/06/24  Time Provider Notified 0745  Method of Notification Page (secure chat)  Notification Reason Change in status;New onset of dysrhythmia  Provider response See new orders;En route  Date of Provider Response 07/06/24  Time of Provider Response 0746  Notify: Rapid Response  Name of Rapid Response RN Notified [facility]  Date Rapid Response Notified 07/06/24  Time Rapid Response Notified 0755  Assess: SIRS CRITERIA  SIRS Temperature  0  SIRS Respirations  1  SIRS Pulse 1  SIRS WBC 0  SIRS Score Sum  2   5mg  of IV metoprolol  given and EKG completed. MD Butter at bedside to assess. New order for IV morphine .

## 2024-07-07 DIAGNOSIS — Z936 Other artificial openings of urinary tract status: Secondary | ICD-10-CM | POA: Diagnosis not present

## 2024-07-07 DIAGNOSIS — A419 Sepsis, unspecified organism: Secondary | ICD-10-CM | POA: Diagnosis not present

## 2024-07-07 DIAGNOSIS — I10 Essential (primary) hypertension: Secondary | ICD-10-CM | POA: Diagnosis not present

## 2024-07-07 DIAGNOSIS — R531 Weakness: Secondary | ICD-10-CM | POA: Diagnosis not present

## 2024-07-07 LAB — GLUCOSE, CAPILLARY
Glucose-Capillary: 132 mg/dL — ABNORMAL HIGH (ref 70–99)
Glucose-Capillary: 142 mg/dL — ABNORMAL HIGH (ref 70–99)
Glucose-Capillary: 145 mg/dL — ABNORMAL HIGH (ref 70–99)
Glucose-Capillary: 180 mg/dL — ABNORMAL HIGH (ref 70–99)
Glucose-Capillary: 185 mg/dL — ABNORMAL HIGH (ref 70–99)

## 2024-07-07 LAB — BASIC METABOLIC PANEL WITH GFR
Anion gap: 9 (ref 5–15)
BUN: 25 mg/dL — ABNORMAL HIGH (ref 8–23)
CO2: 19 mmol/L — ABNORMAL LOW (ref 22–32)
Calcium: 8.4 mg/dL — ABNORMAL LOW (ref 8.9–10.3)
Chloride: 114 mmol/L — ABNORMAL HIGH (ref 98–111)
Creatinine, Ser: 1.98 mg/dL — ABNORMAL HIGH (ref 0.61–1.24)
GFR, Estimated: 34 mL/min — ABNORMAL LOW (ref >60)
Glucose, Bld: 145 mg/dL — ABNORMAL HIGH (ref 70–99)
Potassium: 4.2 mmol/L (ref 3.5–5.1)
Sodium: 142 mmol/L (ref 135–145)

## 2024-07-07 LAB — CBC
HCT: 35.2 % — ABNORMAL LOW (ref 39.0–52.0)
Hemoglobin: 10.7 g/dL — ABNORMAL LOW (ref 13.0–17.0)
MCH: 23.4 pg — ABNORMAL LOW (ref 26.0–34.0)
MCHC: 30.4 g/dL (ref 30.0–36.0)
MCV: 76.9 fL — ABNORMAL LOW (ref 80.0–100.0)
Platelets: 358 K/uL (ref 150–400)
RBC: 4.58 MIL/uL (ref 4.22–5.81)
RDW: 22.8 % — ABNORMAL HIGH (ref 11.5–15.5)
WBC: 5.5 K/uL (ref 4.0–10.5)
nRBC: 1.8 % — ABNORMAL HIGH (ref 0.0–0.2)

## 2024-07-07 LAB — MAGNESIUM: Magnesium: 1.9 mg/dL (ref 1.7–2.4)

## 2024-07-07 LAB — CULTURE, BLOOD (ROUTINE X 2): Special Requests: ADEQUATE

## 2024-07-07 LAB — CK: Total CK: 32 U/L — ABNORMAL LOW (ref 49–397)

## 2024-07-07 MED ORDER — HYDROCORTISONE 1 % EX CREA
TOPICAL_CREAM | Freq: Two times a day (BID) | CUTANEOUS | Status: DC
  Administered 2024-07-08: 1 via TOPICAL
  Filled 2024-07-07: qty 28

## 2024-07-07 MED ORDER — DIPHENHYDRAMINE-ZINC ACETATE 2-0.1 % EX CREA
TOPICAL_CREAM | Freq: Three times a day (TID) | CUTANEOUS | Status: DC | PRN
  Filled 2024-07-07: qty 28

## 2024-07-07 NOTE — Progress Notes (Signed)
   07/07/24 0532 07/07/24 0552  Assess: MEWS Score  BP  --  (!) 156/115  MAP (mmHg)  --  129  Pulse Rate  --  (!) 129  ECG Heart Rate  --  (!) 140  Resp  --  20  Assess: MEWS Score  MEWS Temp  --  0  MEWS Systolic  --  0  MEWS Pulse  --  3  MEWS RR  --  0  MEWS LOC  --  0  MEWS Score  --  3  MEWS Score Color  --  Yellow  Assess: if the MEWS score is Yellow or Red  Were vital signs accurate and taken at a resting state?  --  Yes  Does the patient meet 2 or more of the SIRS criteria?  --  Yes  Does the patient have a confirmed or suspected source of infection?  --  Yes  MEWS guidelines implemented   --  Yes, yellow  Treat  MEWS Interventions  --  Considered administering scheduled or prn medications/treatments as ordered  Take Vital Signs  Increase Vital Sign Frequency   --  Yellow: Q2hr x1, continue Q4hrs until patient remains green for 12hrs  Escalate  MEWS: Escalate  --  Yellow: Discuss with charge nurse and consider notifying provider and/or RRT  Notify: Charge Nurse/RN  Name of Charge Nurse/RN Notified  --  Emergency planning/management officer  Provider Notification  Provider Name/Title J.Blondie NP J. Blondie NP  Date Provider Notified 07/07/24 07/07/24  Time Provider Notified 0533 0600  Method of Notification Page Page  Notification Reason New onset of dysrhythmia Change in status  Provider response See new orders  --   Notify: Rapid Response  Name of Rapid Response RN Notified  --  Alm  Date Rapid Response Notified  --  07/07/24  Time Rapid Response Notified  --  0600  Assess: SIRS CRITERIA  SIRS Temperature   --  0  SIRS Respirations   --  0  SIRS Pulse  --  1  SIRS WBC  --  0  SIRS Score Sum   --  1

## 2024-07-07 NOTE — Progress Notes (Signed)
 Patient had loss of peripheral iv.  Patient has Right internal jugular double lumen using that access for medications.  Right internal jugular has been assessed by IV Team previously yesterday with dressing changes.

## 2024-07-07 NOTE — [place] FL2 (Signed)
 [place]  MEDICAID FL2 LEVEL OF CARE FORM     IDENTIFICATION  Patient Name: Adam Harris Birthdate: [birth year] Sex: male Admission Date (Current Location): 06/27/2024  [facility] [place] Number:  Producer, television/film/video and Address:  [facility], 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   Palliative care by specialist 07/02/2024   DNR (do not resuscitate) discussion 07/02/2024   Fall 06/28/2024   Sepsis secondary to UTI (HCC) 06/28/2024   Microcytic anemia 06/28/2024   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) 06/27/2024   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) 06/27/2024   Acute delirium 06/27/2024   Pressure injury of left buttock, stage 3 (HCC) 06/19/2024   Moderate protein-calorie malnutrition (HCC) 06/19/2024   Opioid-induced constipation 06/19/2024   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) 06/04/2024   Generalized weakness 05/25/2024   Complicated UTI (urinary tract infection) 05/25/2024   Type 2 diabetes mellitus with hyperglycemia (HCC) 05/25/2024   Nephrostomy present (HCC) 04/01/2024   AKI (acute kidney injury) (HCC) 03/08/2024   Tachyarrhythmia 03/07/2024   Acute blood loss anemia 03/07/2024   Dysuria 01/25/2024   Presence of Watchman left atrial appendage closure device 12/13/2023   Permanent atrial fibrillation (HCC) 11/29/2023   Hx of hematuria 11/29/2023   Lower urinary tract symptoms (LUTS) 04/06/2023   CKD (chronic kidney  disease), stage IV (HCC) 03/22/2023   Prostate cancer (HCC) 06/21/2022   Cataract cortical, senile, left 12/13/2021   Diabetic peripheral neuropathy (HCC) 12/13/2021   Hydrocele- Left 12/02/2021   Solitary kidney, acquired 10/26/2021   Hypertensive renal disease 10/26/2021   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   Chronic diastolic CHF (congestive heart failure), NYHA class 2 (HCC) 04/02/2017   Atrial fibrillation with RVR (HCC) 11/20/2016   OA (osteoarthritis) of knee 05/31/2016   Low back pain 05/31/2016   Exertional angina (HCC) 10/29/2014   Chronic kidney disease, stage 3b (HCC) 10/29/2014   Coronary artery disease involving native coronary artery of native heart with angina pectoris (HCC) 03/09/2014   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/2014   Hyperlipidemia 02/05/2014   History of non-ST elevation myocardial infarction (NSTEMI) 02/03/2014   Essential hypertension 02/03/2014   Anxiety state 04/19/2013   History of prostate cancer 04/19/2013   Insomnia 04/19/2013   Stiffness of joint, lower leg 04/19/2013   Sciatica 04/19/2013    Orientation RESPIRATION BLADDER Height & Weight     Self, Place  Normal  (nephrostomy) Weight: 192 lb 14.4 oz (87.5 kg) Height:  5' 11 (180.3 cm)  BEHAVIORAL SYMPTOMS/MOOD NEUROLOGICAL BOWEL NUTRITION STATUS        Diet (see discharge summary)  AMBULATORY STATUS COMMUNICATION OF NEEDS Skin   Limited Assist Verbally  (wound left buttocks)                       Personal Care Assistance Level of Assistance  Bathing, Feeding, Dressing,  Total care Bathing Assistance: Maximum assistance Feeding assistance: Limited assistance Dressing Assistance: Maximum assistance Total Care Assistance: Maximum assistance   Functional Limitations Info  Sight, Hearing, Speech          SPECIAL CARE FACTORS FREQUENCY  PT (By licensed PT), OT  (By licensed OT)     PT Frequency: 5x week OT Frequency: 5x week            Contractures Contractures Info: Not present    Additional Factors Info  Code Status, Allergies, Insulin  Sliding Scale Code Status Info: DNR Allergies Info: xarelto    Insulin  Sliding Scale Info: Novolog : see discharge summary       Current Medications (07/07/2024):  This is the current hospital active medication list Current Facility-Administered Medications  Medication Dose Route Frequency Provider Last Rate Last Admin   acetaminophen  (TYLENOL ) tablet 650 mg  650 mg Oral Q6H PRN Croitoru, Mihai, MD   650 mg at 07/07/24 9358   Or   acetaminophen  (TYLENOL ) suppository 650 mg  650 mg Rectal Q6H PRN Croitoru, Mihai, MD       albuterol  (PROVENTIL ) (2.5 MG/3ML) 0.083% nebulizer solution 2.5 mg  2.5 mg Nebulization Q6H PRN Croitoru, Mihai, MD       artificial tears ophthalmic solution 2 drop  2 drop Both Eyes QID Croitoru, Mihai, MD   2 drop at 07/06/24 2100   aspirin  EC tablet 81 mg  81 mg Oral Daily Croitoru, Mihai, MD   81 mg at 07/07/24 0840   calcium  carbonate (TUMS - dosed in mg elemental calcium ) chewable tablet 200 mg of elemental calcium   1 tablet Oral TID WC Akula, Vijaya, MD   200 mg of elemental calcium  at 07/07/24 0840   Chlorhexidine  Gluconate Cloth 2 % PADS 6 each  6 each Topical Daily Cherlyn Labella, MD   6 each at 07/07/24 0841   DAPTOmycin  (CUBICIN ) 900 mg in sodium chloride  0.9 % IVPB  900 mg Intravenous Q1400 Croitoru, Mihai, MD 136 mL/hr at 07/06/24 1505 900 mg at 07/06/24 1505   enoxaparin  (LOVENOX ) injection 40 mg  40 mg Subcutaneous Q24H Croitoru, Mihai, MD   40 mg at 07/07/24 0839   feeding supplement (ENSURE PLUS HIGH PROTEIN) liquid 237 mL  237 mL Oral BID BM Croitoru, Mihai, MD   237 mL at 07/07/24 9162   Gerhardt's butt cream   Topical BID Croitoru, Mihai, MD   Given at 07/07/24 9162   hydrOXYzine  (ATARAX ) tablet 25 mg  25 mg Oral TID PRN Akula, Vijaya, MD   25 mg at 07/07/24 0245    insulin  aspart (novoLOG ) injection 0-9 Units  0-9 Units Subcutaneous TID WC Akula, Vijaya, MD   1 Units at 07/07/24 9160   isosorbide  mononitrate (IMDUR ) 24 hr tablet 15 mg  15 mg Oral Daily Croitoru, Mihai, MD   15 mg at 07/07/24 0840   lactated ringers  infusion   Intravenous Continuous Akula, Vijaya, MD 40 mL/hr at 07/07/24 0015 New Bag at 07/07/24 0015   linaclotide  (LINZESS ) capsule 145 mcg  145 mcg Oral QAC breakfast Croitoru, Mihai, MD   145 mcg at 07/07/24 0841   metoprolol  tartrate (LOPRESSOR ) injection 2.5 mg  2.5 mg Intravenous Q6H PRN Croitoru, Mihai, MD   2.5 mg at 07/07/24 9447   metoprolol  tartrate (LOPRESSOR ) tablet 50 mg  50 mg Oral BID Akula, Vijaya, MD   50 mg at 07/07/24 9160   mirtazapine  (REMERON ) tablet 15 mg  15 mg Oral QHS Croitoru, Mihai, MD   15 mg at  07/06/24 2057   morphine  (PF) 2 MG/ML injection 1-2 mg  1-2 mg Intravenous Q4H PRN Akula, Vijaya, MD   2 mg at 07/07/24 9670   ondansetron  (ZOFRAN ) tablet 4 mg  4 mg Oral Q6H PRN Croitoru, Mihai, MD       Or   ondansetron  (ZOFRAN ) injection 4 mg  4 mg Intravenous Q6H PRN Croitoru, Mihai, MD       oxyCODONE  (Oxy IR/ROXICODONE ) immediate release tablet 5 mg  5 mg Oral Q6H PRN Akula, Vijaya, MD   5 mg at 07/07/24 0159   polyethylene glycol (MIRALAX  / GLYCOLAX ) packet 17 g  17 g Oral Daily PRN Croitoru, Mihai, MD       sodium bicarbonate  tablet 650 mg  650 mg Oral BID Croitoru, Mihai, MD   650 mg at 07/07/24 0840   sodium chloride  flush (NS) 0.9 % injection 3 mL  3 mL Intravenous Q12H Croitoru, Mihai, MD   3 mL at 07/07/24 0850   tamsulosin  (FLOMAX ) capsule 0.4 mg  0.4 mg Oral Daily Croitoru, Mihai, MD   0.4 mg at 07/07/24 0840     Discharge Medications: Please see discharge summary for a list of discharge medications.  Relevant Imaging Results:  Relevant Lab Results:   Additional Information SSN: [id number]  Bridget Cordella Simmonds, LCSW

## 2024-07-07 NOTE — Progress Notes (Signed)
 Occupational Therapy Treatment Patient Details Name: Adam Harris MRN: [id number] DOB: 16-May-1946 Today's Date: 07/07/2024   History of present illness Adam Harris is a 78 y.o. male who presented 06/27/24 with a fall and confusion after a recent nephrostomy replacement. Admitted for aepsis secondary to MRSA bacteremia and Enterococcus faecalis infection from UTI. 8/20 TEE. 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   OT comments  Pt is making steady progress towards their acute OT goals. On arrival pt was on room air and his RR was elevated, SpO2 was 96% and HR was 44. 2L donned with SpO2 at 100% and HR went to 100 with bed mobility. Pt with elevated RR throughout session. Overall he needed min A for all aspects of mobility with significant cues for safety and sequencing due to impaired cognition. Pt was set up for lunch in the chair and needed cues for sustained attention. OT to continue to follow acutely to facilitate progress towards established goals. Pt will continue to benefit from skilled inpatient follow up therapy, <3 hours/day.       If plan is discharge home, recommend the following:  A lot of help with walking and/or transfers;A lot of help with bathing/dressing/bathroom;Assistance with cooking/housework;Assist for transportation;Help with stairs or ramp for entrance   Equipment Recommendations  Other (comment)       Precautions / Restrictions Precautions Precautions: Fall Recall of Precautions/Restrictions: Impaired Restrictions Weight Bearing Restrictions Per Provider Order: No       Mobility Bed Mobility Overal bed mobility: Needs Assistance Bed Mobility: Supine to Sit     Supine to sit: HOB elevated, Min assist          Transfers Overall transfer level: Needs assistance Equipment used: Rolling walker (2 wheels) Transfers: Sit to/from  Stand, Bed to chair/wheelchair/BSC Sit to Stand: From elevated surface, Min assist     Step pivot transfers: Min assist     General transfer comment: min A for balance, singificant cues needed for safety and problem solving     Balance Overall balance assessment: Needs assistance, History of Falls Sitting-balance support: Bilateral upper extremity supported, Feet supported Sitting balance-Leahy Scale: Fair     Standing balance support: Bilateral upper extremity supported, During functional activity, Reliant on assistive device for balance Standing balance-Leahy Scale: Poor             ADL either performed or assessed with clinical judgement   ADL Overall ADL's : Needs assistance/impaired Eating/Feeding: Set up;Sitting Eating/Feeding Details (indicate cue type and reason): cues for sustained attnetion to meal           Toilet Transfer: Minimal assistance;Stand-pivot;Rolling walker (2 wheels) Toilet Transfer Details (indicate cue type and reason): needs significant cues, min A for balance         Functional mobility during ADLs: Minimal assistance;Rolling walker (2 wheels);Cueing for safety General ADL Comments: limited by cognition, itching, balance and weakness    Extremity/Trunk Assessment Upper Extremity Assessment Upper Extremity Assessment: RUE deficits/detail;LUE deficits/detail RUE Deficits / Details: WFL for mobility and feeding LUE Deficits / Details: chronic shoulder pain and limited ROM from a prior fall   Lower Extremity Assessment Lower Extremity Assessment: Generalized weakness        Vision   Vision Assessment?: No apparent visual deficits   Perception Perception Perception: Not tested   Praxis Praxis Praxis: Not tested   Communication Communication Communication: No apparent difficulties  Cognition Arousal: Alert Behavior During Therapy: Restless, Flat affect Cognition: Cognition impaired   Orientation impairments:  Situation Awareness: Intellectual awareness impaired Memory impairment (select all impairments): Short-term memory, Working memory Attention impairment (select first level of impairment): Sustained attention Executive functioning impairment (select all impairments): Sequencing, Reasoning, Problem solving OT - Cognition Comments: disoriented to situation. unable to provide PLOF. followed some simple 1 step commands             Following commands: Impaired Following commands impaired: Follows one step commands inconsistently      Cueing   Cueing Techniques: Verbal cues, Gestural cues        General Comments pt with increased RR on arrival, SpO2 was 96% on RA. HR was 44. Pt placed back on 2L with 100% SpO2, adn HR went to 100 with bed mobility    Pertinent Vitals/ Pain       Pain Assessment Pain Assessment: Faces Faces Pain Scale: Hurts a little bit Pain Location: bottom Pain Descriptors / Indicators: Discomfort, Grimacing, Aching, Sore, Tender Pain Intervention(s): Limited activity within patient's tolerance, Monitored during session   Frequency  Min 2X/week        Progress Toward Goals  OT Goals(current goals can now be found in the care plan section)  Progress towards OT goals: Progressing toward goals  Acute Rehab OT Goals Patient Stated Goal: to eat OT Goal Formulation: With patient Time For Goal Achievement: 07/18/24 Potential to Achieve Goals: Good ADL Goals Pt Will Perform Grooming: with modified independence;standing Pt Will Perform Upper Body Dressing: with modified independence;sitting Pt Will Perform Lower Body Dressing: with min assist;sit to/from stand Pt Will Transfer to Toilet: with min assist;ambulating Additional ADL Goal #1: Pt will complete bed mobility with min A as a precursor to ADLs  Plan      AM-PAC OT 6 Clicks Daily Activity     Outcome Measure   Help from another person eating meals?: A Little Help from another person taking care  of personal grooming?: A Little Help from another person toileting, which includes using toliet, bedpan, or urinal?: A Lot Help from another person bathing (including washing, rinsing, drying)?: A Lot Help from another person to put on and taking off regular upper body clothing?: A Little Help from another person to put on and taking off regular lower body clothing?: A Lot 6 Click Score: 15    End of Session Equipment Utilized During Treatment: Rolling walker (2 wheels);Gait belt;Oxygen  OT Visit Diagnosis: Unsteadiness on feet (R26.81);Other abnormalities of gait and mobility (R26.89);Muscle weakness (generalized) (M62.81)   Activity Tolerance Patient tolerated treatment well   Patient Left in bed;with call bell/phone within reach   Nurse Communication Mobility status;Other (comment)        Time: 8864-8848 OT Time Calculation (min): 16 min  Charges: OT General Charges $OT Visit: 1 Visit OT Treatments $Therapeutic Activity: 8-22 mins  Lucie Kendall, OTR/L Acute Rehabilitation Services Office [phone] Secure Chat Communication Preferred   Lucie JONETTA Kendall 07/07/2024, 12:25 PM

## 2024-07-07 NOTE — Progress Notes (Signed)
 [facility]                                                                               Adam Harris, is a 78 y.o. male, DOB - 1945-12-14, FMW:[id number] Admit date - 06/27/2024    Outpatient Primary MD for the patient is Rudd, Garnette HERO, MD  LOS - 9  days    Brief summary   Adam Harris is a 78 y.o. 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   Assessment & Plan    Assessment and Plan:  Sepsis secondary to MRSA bacteremia and Enterococcus faecalis infection from complicated urinary tract infection ID on board and managing antibiotics.  Patient afebrile and wbc has normalized.  TEE  did not show any endocarditis.  IR consulted for central venous access for prolonged antibiotics.    He underwent right IJ tunneled dual-lumen power injectable catheter with ultrasound and fluoroscopic guidance. He will finish antibiotics and the internal jugular tunneled catheter to be removed after the completion of the course of antibiotics.  Repeat blood cultures done and negative so far.    Afib with RVR  Resolved.  Increased the metoprolol  to 50 mg BID with better control of rate.  Not on anti coagulation.    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 Pain control with oxycodone  5 mg every 5 hours prn and morphine  IV.    Hypertension Better controlled.  Patient currently on 15 mg of Imdur , 25 mg of Lopressor  3 times daily.  Hydralazine  added.   Type 2 diabetes mellitus with hyperglycemia.   Recent Labs     07/07/24 0427 07/07/24 1138 07/07/24 1637  GLUCAP 132* 145* 185*  Continue with sliding scale insulin  No changes.  Microcytic anemia Hemoglobin stable around 10.   Heart failure with preserved left ventricular ejection fraction Last echo showed left ventricular ejection fraction of 60 to 65%  Permanent atrial fibrillation Not on anticoagulation.  Rate controlled with metoprolol   Acute kidney injury with metabolic acidosis secondary to sepsis Creatinine appears to have reached a plateau.   In view of his poor functional status, palliative care consulted for goals of care. Discussions ongoing.  Recommended to follow u with Dr Lanny as outpatient.  Therapy eval recommending SNF.     Estimated body mass index is 26.9 kg/m as calculated from the following:   Height as of this encounter: 5' 11 (1.803 m).   Weight as of this encounter: 87.5 kg.  Code Status: dnr DVT Prophylaxis:  enoxaparin  (LOVENOX ) injection 40 mg Start: 06/28/24 1000   Level of Care: Level of care: Telemetry Medical Family Communication: none at bedside.  Disposition Plan:     Remains inpatient appropriate:  pending clinical improvement.   Procedures:  TEE  Consultants:   Cardiology Palliative care  ID  Urology.   Antimicrobials:   Anti-infectives (From admission, onward)    Start     Dose/Rate Route Frequency Ordered Stop   07/01/24 1400  DAPTOmycin  (CUBICIN ) IVPB 700 mg/100mL premix  Status:  Discontinued        700 mg 200 mL/hr over 30 Minutes Intravenous Every 48 hours 06/30/24 1445 07/01/24 0955   07/01/24 1045  DAPTOmycin  (CUBICIN ) IVPB 700 mg/100mL premix  Status:  Discontinued        700 mg 200 mL/hr over 30 Minutes Intravenous Daily 07/01/24 0955 07/01/24 1028   07/01/24 1045  DAPTOmycin  (CUBICIN ) 900 mg in sodium chloride  0.9 % IVPB        900 mg 136 mL/hr over 30 Minutes Intravenous Daily 07/01/24 1028     06/29/24 2000  vancomycin  (VANCOCIN ) IVPB 1000 mg/200 mL premix  Status:   Discontinued        1,000 mg 200 mL/hr over 60 Minutes Intravenous Every 24 hours 06/28/24 1922 06/29/24 1543   06/29/24 1700  cefTRIAXone  (ROCEPHIN ) 1 g in sodium chloride  0.9 % 100 mL IVPB  Status:  Discontinued        1 g 200 mL/hr over 30 Minutes Intravenous Every 24 hours 06/29/24 1551 06/30/24 1154   06/29/24 1700  DAPTOmycin  (CUBICIN ) IVPB 700 mg/143mL premix  Status:  Discontinued        700 mg 200 mL/hr over 30 Minutes Intravenous Daily 06/29/24 1552 06/30/24 1445   06/28/24 2015  vancomycin  (VANCOREADY) IVPB 2000 mg/400 mL        2,000 mg 200 mL/hr over 120 Minutes Intravenous  Once 06/28/24 1922 06/29/24 0903   06/28/24 0845  piperacillin -tazobactam (ZOSYN ) IVPB 3.375 g  Status:  Discontinued        3.375 g 12.5 mL/hr over 240 Minutes Intravenous Every 8 hours 06/28/24 0844 06/29/24 1543   06/28/24 0200  piperacillin -tazobactam (ZOSYN ) IVPB 3.375 g        3.375 g 100 mL/hr over 30 Minutes Intravenous  Once 06/28/24 0154 06/28/24 0345        Medications  Scheduled Meds:  artificial tears  2 drop Both Eyes QID   aspirin  EC  81 mg Oral Daily   calcium  carbonate  1 tablet Oral TID WC   Chlorhexidine  Gluconate Cloth  6 each Topical Daily   enoxaparin  (LOVENOX ) injection  40 mg Subcutaneous Q24H   feeding supplement  237 mL Oral BID BM   Gerhardt's butt cream   Topical BID   hydrocortisone  cream   Topical BID   insulin  aspart  0-9 Units Subcutaneous TID WC   isosorbide  mononitrate  15 mg Oral Daily   linaclotide   145 mcg Oral QAC breakfast   metoprolol  tartrate  50 mg Oral BID   mirtazapine   15 mg Oral QHS   sodium bicarbonate   650 mg Oral BID   sodium chloride  flush  3 mL Intravenous Q12H   tamsulosin   0.4 mg Oral Daily   Continuous Infusions:  DAPTOmycin  900 mg (07/07/24 1435)   lactated ringers  40 mL/hr at 07/07/24 0015   PRN Meds:.acetaminophen  **OR** acetaminophen , albuterol , diphenhydrAMINE -zinc  acetate, hydrOXYzine , metoprolol  tartrate, morphine  injection,  ondansetron  **OR** ondansetron  (ZOFRAN ) IV, oxyCODONE , polyethylene glycol    Subjective:   Adam Harris was seen and examined today. Alert, reports some pain in theback.    Objective:   Vitals:   07/07/24 0428 07/07/24 0552 07/07/24 0600 07/07/24 1507  BP: (!) 153/105 (!) 156/115 (!) 139/100 122/89  Pulse: (!) 106 (!) 129 (!) 119 67  Resp: 20 20  16   Temp: 98 F (36.7 C)     TempSrc: Oral     SpO2: 92%   92%  Weight:      Height:        Intake/Output Summary (Last 24 hours) at 07/07/2024 1745 Last data filed at 07/07/2024 1600 Gross per 24 hour  Intake 1082.37 ml  Output 350 ml  Net 732.37 ml   Filed Weights   07/01/24 0500 07/02/24 0721 07/03/24 0708  Weight: 91.5 kg 87.5 kg 87.5 kg     Exam General exam: Appears calm and comfortable  Respiratory system: Clear to auscultation. Respiratory effort normal. Cardiovascular system: S1 & S2 heard, RRR. No JVD,  Gastrointestinal system: Abdomen is nondistended, soft and nontender.  Central nervous system: Alert and oriented to person and place only. . No focal neurological deficits. Extremities: no edema.  Skin: No rashes,  Psychiatry: Mood & affect appropriate.        Data Reviewed:  I have personally reviewed following labs and imaging studies   CBC Lab Results  Component Value Date   WBC 5.5 07/07/2024   RBC 4.58 07/07/2024   HGB 10.7 (L) 07/07/2024   HCT 35.2 (L) 07/07/2024   MCV 76.9 (L) 07/07/2024   MCH 23.4 (L) 07/07/2024   PLT 358 07/07/2024   MCHC 30.4 07/07/2024   RDW 22.8 (H) 07/07/2024   LYMPHSABS 0.5 (L) 07/06/2024   MONOABS 0.2 07/06/2024   EOSABS 0.0 07/06/2024   BASOSABS 0.1 07/06/2024     Last metabolic panel Lab Results  Component Value Date   NA 142 07/07/2024   K 4.2 07/07/2024   CL 114 (H) 07/07/2024   CO2 19 (L) 07/07/2024   BUN 25 (H) 07/07/2024   CREATININE 1.98 (H) 07/07/2024   GLUCOSE 145 (H) 07/07/2024   GFRNONAA 34 (L) 07/07/2024   GFRAA 32 (L) 08/05/2020   CALCIUM   8.4 (L) 07/07/2024   PHOS 4.2 08/06/2023   PROT 7.0 07/01/2024   ALBUMIN  1.8 (L) 07/01/2024   BILITOT 1.3 (H) 07/01/2024   ALKPHOS 130 (H) 07/01/2024   AST 41 07/01/2024   ALT 23 07/01/2024   ANIONGAP 9 07/07/2024    CBG (last 3)  Recent Labs    07/07/24 0427 07/07/24 1138 07/07/24 1637  GLUCAP 132* 145* 185*      Coagulation Profile: No results for input(s): INR, PROTIME in the last 168 hours.    Radiology Studies: No results found.      Elgie Butter M.D. [facility] 07/07/2024, 5:45 PM  Available via Epic secure chat 7am-7pm After 7 pm, please refer to night coverage provider listed on amion.

## 2024-07-07 NOTE — Progress Notes (Signed)
 Daily Progress Note   Date: 07/07/2024   Patient Name: Adam Harris  DOB: October 17, 1946  MRN: [id number]  Age / Sex: 78 y.o., male  Attending Physician: Adam Labella, Harris Primary Care Physician: Adam Harris Admit Date: 06/27/2024 Length of Stay: 9 days  Reason for Follow-up: Establishing goals of care  Past Medical History:  Diagnosis Date   Abnormal radiologic findings on diagnostic imaging of renal pelvis, ureter, or bladder    bilateral ureter abnormalities   Anticoagulant long-term use    eliquis    Anxiety    pt denies   Arthritis    Atrial fibrillation, chronic (HCC)    CAD (coronary artery disease) cardiologist-- dr hochrein   NSTEMI 02-04-2014  per cardiac cath chronic occluded RCA w/ faint left-to-right collaterals and aneurysmal LCFx with sluggish coronary flow/   NSTEMI --11-21-2016 per cardiac cath occluded proximal RCA & mid to diastal CFX 100%, med rx. If that does not work, PTCA or CABG   Cancer [facility])    bladder and kidney   CHF (congestive heart failure) (HCC)    CKD (chronic kidney disease), stage III (HCC)    patient unaware   DOE (dyspnea on exertion)    Fatty liver    pt denies   Hematuria 02/2019   History of COVID-19 10/2019   History of non-ST elevation myocardial infarction (NSTEMI)    02-04-2014  and 11-21-2016  cardiac cath done both times ,  medically management   History of shingles 12/2017   slight pain and numbness still noted in the area   Hyperlipidemia    Hypertension    Insomnia    Myocardial infarction [facility]) 2015   Persistent atrial fibrillation [facility])    cardiologsit-- dr hochrein   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 Kennyth   Thoracic aortic atherosclerosis (HCC)    Type 2 diabetes mellitus (HCC)    Urinary frequency     Subjective:   Subjective: Chart Reviewed. Updates received. Patient Assessed. Created space and opportunity for patient  and family to  explore thoughts and feelings regarding current medical situation.  People present in person during meeting today: - Patient  Today's Discussion: Today before meeting with the patient/family, I reviewed the chart notes. I also reviewed vital signs, nursing flowsheets, medication administrations record, labs, and imaging. - Followed up with patient after HCPOA documentation and DNR status updated to reflect his wishes - Patient reports that he feels good about his decision and glad that he was finally Adam to make the change - Patient had some confusion during the visit stating that he is not sure why he was here in the hospital, I clarified with the patient the reasons were that he became septic after his chemotherapy and PCN exchange resulting in confusion and fall which prompted his wife to seek help  Review of Systems  Constitutional:  Positive for appetite change and fatigue.    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     Comments: Sleeping but awakes to voice  HENT:     Head: Atraumatic.  Pulmonary:     Effort: Pulmonary effort is normal.  Neurological:     General: No focal deficit present.    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 Faecalis bacteremia.   SUMMARY OF RECOMMENDATIONS   Continue to follow up and discuss MOST form with patient and family before discharge  Symptom Management:  Oxycodone  5 mg Q6 PRN Morphine  1-2 mg Q4 PRN  Code Status: DNR - Limited (DNR/DNI)  Prognosis: < 12 months  Discharge Planning: SNF  Discussed with: Cherlyn, Harris  Thank you for allowing us  to participate in the care of Adam Harris will continue to support holistically.  Time Total: 35 minutes  Detailed review of medical records (labs, imaging, vital signs), medically appropriate exam, discussed with treatment team, counseling and education to patient, family, & staff, documenting clinical information, medication management, coordination of care.   Adam Harris  Palliative Medicine Team  Team Phone # [phone] (Nights/Weekends) 07/07/2024 10:54 AM

## 2024-07-07 NOTE — TOC Progression Note (Signed)
 Transition of Care [facility]) - Progression Note    Patient Details  Name: Adam Harris MRN: [id number] Date of Birth: [birth year]  Transition of Care [facility]) CM/SW Contact  Bridget Cordella Simmonds, LCSW Phone Number: 07/07/2024, 12:22 PM  Clinical Narrative:   CSW spoke with pt wife Devere regarding plan for SNF, she requested CSW speak with [name].  CSW spoke with [name], who does agree to SNF, if it will help, discussed that this is PT recommendation, [name] does give permission to sent out referral in hub.  Would be interested in [facility] or higher rated SNFs.  Referral sent out in hub for SNF.        Barriers to Discharge: Continued Medical Work up               Expected Discharge Plan and Services   Discharge Planning Services: CM Consult   Living arrangements for the past 2 months: Single Family Home                                       Social Drivers of Health (SDOH) Interventions SDOH Screenings   Food Insecurity: No Food Insecurity (06/28/2024)  Housing: Low Risk  (06/28/2024)  Transportation Needs: No Transportation Needs (06/28/2024)  Utilities: Not At Risk (06/28/2024)  Alcohol  Screen: Low Risk  (08/06/2023)  Depression (PHQ2-9): Low Risk  (06/25/2024)  Financial Resource Strain: Low Risk  (08/06/2023)  Physical Activity: Inactive (08/06/2023)  Social Connections: Moderately Integrated (06/28/2024)  Stress: No Stress Concern Present (08/06/2023)  Tobacco Use: Medium Risk (06/27/2024)  Health Literacy: Adequate Health Literacy (08/06/2023)    Readmission Risk Interventions    07/02/2024   12:36 PM 05/26/2024    3:33 PM  Readmission Risk Prevention Plan  Transportation Screening Complete Complete  PCP or Specialist Appt within 3-5 Days  Complete  HRI or Home Care Consult  Complete  Social Work Consult for Recovery Care Planning/Counseling  Complete  Palliative Care Screening  Not Applicable  Medication Review Oceanographer)  Referral to Pharmacy  PCP or  Specialist appointment within 3-5 days of discharge Complete   HRI or Home Care Consult Complete   Palliative Care Screening Complete

## 2024-07-07 NOTE — Progress Notes (Signed)
   07/06/24 2019 07/07/24 0235  Provider Notification  Provider Name/Title J. Blondie NP J. Blondie NP  Date Provider Notified 07/06/24 07/07/24  Time Provider Notified 2019 0236  Method of Notification Page (secure chat) Page  Notification Reason New onset of dysrhythmia New onset of dysrhythmia  Type of New Onset of Dysrhythmia Ventricular tachycardia (received call from telemetry pt had 6 beats of Vtach) Supraventricular tachycardia (16 beats)  Symptoms of New Onset of Dysrhythmia Asymptomatic Asymptomatic

## 2024-07-07 NOTE — Plan of Care (Signed)
  Problem: Education: Goal: Knowledge of General Education information will improve Description: Including pain rating scale, medication(s)/side effects and non-pharmacologic comfort measures Outcome: Not Progressing   Problem: Health Behavior/Discharge Planning: Goal: Ability to manage health-related needs will improve Outcome: Not Progressing   Problem: Clinical Measurements: Goal: Ability to maintain clinical measurements within normal limits will improve Outcome: Progressing Goal: Will remain free from infection Outcome: Progressing Goal: Respiratory complications will improve Outcome: Progressing   Problem: Activity: Goal: Risk for activity intolerance will decrease Outcome: Not Progressing   Problem: Pain Managment: Goal: General experience of comfort will improve and/or be controlled Outcome: Progressing

## 2024-07-07 NOTE — Progress Notes (Signed)
 Mobility Specialist Progress Note:    07/07/24 1215  Mobility  Activity Pivoted/transferred from bed to chair;Pivoted/transferred from chair to bed  Level of Assistance Moderate assist, patient does 50-74%  Assistive Device Front wheel walker  Distance Ambulated (ft) 10 ft  Activity Response Tolerated poorly  Mobility Referral Yes  Mobility visit 1 Mobility  Mobility Specialist Start Time (ACUTE ONLY) 0920  Mobility Specialist Stop Time (ACUTE ONLY) 0950  Mobility Specialist Time Calculation (min) (ACUTE ONLY) 30 min   Pt received in bed agreeable to mobility. Pt required MinA for bed mobility and modA for STS. +2 for safety d/t pt being very hard to redirect. During ambulation pt HR increased to 161 and SPO2 dropped to 80% on RA. O2 flow increased to 3L/min to keep SPO2 above 88%. Pt sat in chair to recover HR only able to get it to 126. Further mobility deferred, returned to bed w/ call bell and personable belongings in reach. HR was 123. All needs met. RN aware.   Thersia Minder Mobility Specialist  Please contact vis Secure Chat or  Rehab Office [phone]

## 2024-07-08 DIAGNOSIS — I10 Essential (primary) hypertension: Secondary | ICD-10-CM | POA: Diagnosis not present

## 2024-07-08 DIAGNOSIS — Z936 Other artificial openings of urinary tract status: Secondary | ICD-10-CM | POA: Diagnosis not present

## 2024-07-08 DIAGNOSIS — R531 Weakness: Secondary | ICD-10-CM | POA: Diagnosis not present

## 2024-07-08 DIAGNOSIS — A419 Sepsis, unspecified organism: Secondary | ICD-10-CM | POA: Diagnosis not present

## 2024-07-08 LAB — GLUCOSE, CAPILLARY
Glucose-Capillary: 156 mg/dL — ABNORMAL HIGH (ref 70–99)
Glucose-Capillary: 154 mg/dL — ABNORMAL HIGH (ref 70–99)
Glucose-Capillary: 164 mg/dL — ABNORMAL HIGH (ref 70–99)
Glucose-Capillary: 108 mg/dL — ABNORMAL HIGH (ref 70–99)
Glucose-Capillary: 156 mg/dL — ABNORMAL HIGH (ref 70–99)
Glucose-Capillary: 152 mg/dL — ABNORMAL HIGH (ref 70–99)

## 2024-07-08 NOTE — Progress Notes (Signed)
 [facility]                                                                               Adam Harris, is a 78 y.o. male, DOB - May 06, 1946, FMW:[id number] Admit date - 06/27/2024    Outpatient Primary MD for the patient is Rudd, Garnette HERO, MD  LOS - 10  days    Brief summary   Adam Harris is a 78 y.o. 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  He was found to have sepsis from MRSA Bacteremia and enterococcus faecalis infection.   Assessment & Plan    Assessment and Plan:  Sepsis secondary to MRSA bacteremia and Enterococcus faecalis infection from complicated urinary tract infection ID on board and managing antibiotics.   Patient afebrile and wbc has normalized.  TEE  did not show any endocarditis.  IR consulted for central venous access for prolonged antibiotics.    He underwent right IJ tunneled dual-lumen power injectable catheter with ultrasound and fluoroscopic guidance. He will finish antibiotics and the internal jugular tunneled catheter to be removed after the completion of the course of antibiotics.  Repeat blood cultures done and negative so far.    Permament atrial fibrillation:  Rate better.  Increased the metoprolol  to 50 mg BID with better control of rate.  Not on anti coagulation.    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 Pain control with oxycodone  5 mg every 6 hours prn and morphine  IV.    Hypertension Better controlled.  Patient currently on 15 mg of Imdur , 25 mg of Lopressor  3 times  daily.  Hydralazine  added.   Type 2 diabetes mellitus with hyperglycemia.   Recent Labs    07/08/24 0503 07/08/24 0759 07/08/24 1221  GLUCAP 156* 154* 164*  Continue with sliding scale insulin  No changes.  Microcytic anemia Hemoglobin stable around 10.   Heart failure with preserved left ventricular ejection fraction Last echo showed left ventricular ejection fraction of 60 to 65%   Acute kidney injury with metabolic acidosis secondary to sepsis Creatinine appears to have reached a plateau.   In view of his poor functional status, palliative care consulted for goals of care. Discussions ongoing.  Recommended to follow u with Dr Lanny as outpatient.  Therapy eval recommending SNF.     Estimated body mass index is 26.9 kg/m as calculated from the following:   Height as of this encounter: 5' 11 (1.803 m).   Weight as of this encounter: 87.5 kg.  Code Status: dnr DVT Prophylaxis:  enoxaparin  (LOVENOX ) injection 40 mg Start: 06/28/24 1000   Level of Care: Level of care: Telemetry Medical Family Communication: none at bedside.  Disposition Plan:     Remains inpatient appropriate:  pending clinical improvement.   Procedures:  TEE  Consultants:   Cardiology  Palliative care  ID  Urology.   Antimicrobials:   Anti-infectives (From admission, onward)    Start     Dose/Rate Route Frequency Ordered Stop   07/01/24 1400  DAPTOmycin  (CUBICIN ) IVPB 700 mg/100mL premix  Status:  Discontinued        700 mg 200 mL/hr over 30 Minutes Intravenous Every 48 hours 06/30/24 1445 07/01/24 0955   07/01/24 1045  DAPTOmycin  (CUBICIN ) IVPB 700 mg/187mL premix  Status:  Discontinued        700 mg 200 mL/hr over 30 Minutes Intravenous Daily 07/01/24 0955 07/01/24 1028   07/01/24 1045  DAPTOmycin  (CUBICIN ) 900 mg in sodium chloride  0.9 % IVPB        900 mg 136 mL/hr over 30 Minutes Intravenous Daily 07/01/24 1028     06/29/24 2000  vancomycin  (VANCOCIN ) IVPB 1000 mg/200 mL premix   Status:  Discontinued        1,000 mg 200 mL/hr over 60 Minutes Intravenous Every 24 hours 06/28/24 1922 06/29/24 1543   06/29/24 1700  cefTRIAXone  (ROCEPHIN ) 1 g in sodium chloride  0.9 % 100 mL IVPB  Status:  Discontinued        1 g 200 mL/hr over 30 Minutes Intravenous Every 24 hours 06/29/24 1551 06/30/24 1154   06/29/24 1700  DAPTOmycin  (CUBICIN ) IVPB 700 mg/100mL premix  Status:  Discontinued        700 mg 200 mL/hr over 30 Minutes Intravenous Daily 06/29/24 1552 06/30/24 1445   06/28/24 2015  vancomycin  (VANCOREADY) IVPB 2000 mg/400 mL        2,000 mg 200 mL/hr over 120 Minutes Intravenous  Once 06/28/24 1922 06/29/24 0903   06/28/24 0845  piperacillin -tazobactam (ZOSYN ) IVPB 3.375 g  Status:  Discontinued        3.375 g 12.5 mL/hr over 240 Minutes Intravenous Every 8 hours 06/28/24 0844 06/29/24 1543   06/28/24 0200  piperacillin -tazobactam (ZOSYN ) IVPB 3.375 g        3.375 g 100 mL/hr over 30 Minutes Intravenous  Once 06/28/24 0154 06/28/24 0345        Medications  Scheduled Meds:  artificial tears  2 drop Both Eyes QID   aspirin  EC  81 mg Oral Daily   calcium  carbonate  1 tablet Oral TID WC   Chlorhexidine  Gluconate Cloth  6 each Topical Daily   enoxaparin  (LOVENOX ) injection  40 mg Subcutaneous Q24H   feeding supplement  237 mL Oral BID BM   Gerhardt's butt cream   Topical BID   hydrocortisone  cream   Topical BID   insulin  aspart  0-9 Units Subcutaneous TID WC   isosorbide  mononitrate  15 mg Oral Daily   linaclotide   145 mcg Oral QAC breakfast   metoprolol  tartrate  50 mg Oral BID   mirtazapine   15 mg Oral QHS   sodium bicarbonate   650 mg Oral BID   sodium chloride  flush  3 mL Intravenous Q12H   tamsulosin   0.4 mg Oral Daily   Continuous Infusions:  DAPTOmycin  900 mg (07/07/24 1435)   PRN Meds:.acetaminophen  **OR** acetaminophen , albuterol , diphenhydrAMINE -zinc  acetate, hydrOXYzine , metoprolol  tartrate, morphine  injection, ondansetron  **OR** ondansetron   (ZOFRAN ) IV, oxyCODONE , polyethylene glycol    Subjective:   Adam Harris was seen and examined today.  Confused but appears comfortable.    Objective:   Vitals:   07/07/24 1507 07/07/24 1959 07/08/24 0438 07/08/24 0805  BP: 122/89 121/78 (!) 155/99 (!) 159/95  Pulse: 67 (!) 110 (!) 109 (!) 115  Resp: 16 17 16  Temp:  (!) 97.5 F (36.4 C) 98.4 F (36.9 C)   TempSrc:  Oral    SpO2: 92% 97% 96% 99%  Weight:      Height:        Intake/Output Summary (Last 24 hours) at 07/08/2024 1244 Last data filed at 07/08/2024 1050 Gross per 24 hour  Intake 1202.37 ml  Output 750 ml  Net 452.37 ml   Filed Weights   07/01/24 0500 07/02/24 0721 07/03/24 0708  Weight: 91.5 kg 87.5 kg 87.5 kg     Exam General exam: Appears calm and comfortable  Respiratory system: Clear to auscultation. Respiratory effort normal. Cardiovascular system: S1 & S2 heard, irregularly irregular  Gastrointestinal system: Abdomen is nondistended, soft and nontender.  Central nervous system: confused, but answering simple questions.  Extremities:no edema.  Skin: No rashes, Psychiatry:Mood & affect appropriate.         Data Reviewed:  I have personally reviewed following labs and imaging studies   CBC Lab Results  Component Value Date   WBC 5.5 07/07/2024   RBC 4.58 07/07/2024   HGB 10.7 (L) 07/07/2024   HCT 35.2 (L) 07/07/2024   MCV 76.9 (L) 07/07/2024   MCH 23.4 (L) 07/07/2024   PLT 358 07/07/2024   MCHC 30.4 07/07/2024   RDW 22.8 (H) 07/07/2024   LYMPHSABS 0.5 (L) 07/06/2024   MONOABS 0.2 07/06/2024   EOSABS 0.0 07/06/2024   BASOSABS 0.1 07/06/2024     Last metabolic panel Lab Results  Component Value Date   NA 142 07/07/2024   K 4.2 07/07/2024   CL 114 (H) 07/07/2024   CO2 19 (L) 07/07/2024   BUN 25 (H) 07/07/2024   CREATININE 1.98 (H) 07/07/2024   GLUCOSE 145 (H) 07/07/2024   GFRNONAA 34 (L) 07/07/2024   GFRAA 32 (L) 08/05/2020   CALCIUM  8.4 (L) 07/07/2024   PHOS 4.2  08/06/2023   PROT 7.0 07/01/2024   ALBUMIN  1.8 (L) 07/01/2024   BILITOT 1.3 (H) 07/01/2024   ALKPHOS 130 (H) 07/01/2024   AST 41 07/01/2024   ALT 23 07/01/2024   ANIONGAP 9 07/07/2024    CBG (last 3)  Recent Labs    07/08/24 0503 07/08/24 0759 07/08/24 1221  GLUCAP 156* 154* 164*      Coagulation Profile: No results for input(s): INR, PROTIME in the last 168 hours.    Radiology Studies: No results found.      Elgie Butter M.D. [facility] 07/08/2024, 12:44 PM  Available via Epic secure chat 7am-7pm After 7 pm, please refer to night coverage provider listed on amion.

## 2024-07-08 NOTE — Progress Notes (Signed)
 Conversation with [facility] JINNY Kipper NP patient is removing O2 and leads mittens are not effective

## 2024-07-08 NOTE — Progress Notes (Signed)
 CMT called stated HR wlwvated to 140-175 patient was attempting to get OOB stated he needed to see his wife I informed him he was in hospital and what time it was so he settled down and said he would go back to sleep

## 2024-07-08 NOTE — Plan of Care (Signed)
  Problem: Clinical Measurements: Goal: Ability to maintain clinical measurements within normal limits will improve Outcome: Progressing Goal: Will remain free from infection Outcome: Progressing Goal: Diagnostic test results will improve Outcome: Progressing Goal: Respiratory complications will improve Outcome: Progressing Goal: Cardiovascular complication will be avoided Outcome: Progressing   Problem: Activity: Goal: Risk for activity intolerance will decrease Outcome: Progressing   Problem: Nutrition: Goal: Adequate nutrition will be maintained Outcome: Progressing   Problem: Coping: Goal: Level of anxiety will decrease Outcome: Progressing   Problem: Elimination: Goal: Will not experience complications related to bowel motility Outcome: Progressing Goal: Will not experience complications related to urinary retention Outcome: Progressing   Problem: Pain Managment: Goal: General experience of comfort will improve and/or be controlled Outcome: Progressing

## 2024-07-08 NOTE — TOC Progression Note (Signed)
 Transition of Care [facility]) - Progression Note    Patient Details  Name: Adam Harris MRN: [id number] Date of Birth: 12/15/1945  Transition of Care [facility]) CM/SW Contact  Bridget Cordella Simmonds, LCSW Phone Number: 07/08/2024, 12:59 PM  Clinical Narrative:   Bed offers provided to [name] by phone.  He is requesting response from [facility].  CSW has reached out to that facility, awaiting response.       Barriers to Discharge: Continued Medical Work up               Expected Discharge Plan and Services   Discharge Planning Services: CM Consult   Living arrangements for the past 2 months: Single Family Home                                       Social Drivers of Health (SDOH) Interventions SDOH Screenings   Food Insecurity: No Food Insecurity (06/28/2024)  Housing: Low Risk  (06/28/2024)  Transportation Needs: No Transportation Needs (06/28/2024)  Utilities: Not At Risk (06/28/2024)  Alcohol  Screen: Low Risk  (08/06/2023)  Depression (PHQ2-9): Low Risk  (06/25/2024)  Financial Resource Strain: Low Risk  (08/06/2023)  Physical Activity: Inactive (08/06/2023)  Social Connections: Moderately Integrated (06/28/2024)  Stress: No Stress Concern Present (08/06/2023)  Tobacco Use: Medium Risk (06/27/2024)  Health Literacy: Adequate Health Literacy (08/06/2023)    Readmission Risk Interventions    07/02/2024   12:36 PM 05/26/2024    3:33 PM  Readmission Risk Prevention Plan  Transportation Screening Complete Complete  PCP or Specialist Appt within 3-5 Days  Complete  HRI or Home Care Consult  Complete  Social Work Consult for Recovery Care Planning/Counseling  Complete  Palliative Care Screening  Not Applicable  Medication Review Oceanographer)  Referral to Pharmacy  PCP or Specialist appointment within 3-5 days of discharge Complete   HRI or Home Care Consult Complete   Palliative Care Screening Complete

## 2024-07-08 NOTE — Progress Notes (Signed)
 Physical Therapy Treatment Patient Details Name: Adam Harris MRN: [id number] DOB: February 14, 1946 Today's Date: 07/08/2024   History of Present Illness Adam Harris is a 78 y.o. male who presented 06/27/24 with a fall and confusion after a recent nephrostomy replacement. Admitted for aepsis secondary to MRSA bacteremia and Enterococcus faecalis infection from UTI. 8/20 TEE. 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    PT Comments  Pt resting in bed on arrival, agreeable to session with encouragement as pt demonstrating some anxiousness however pt unable to specify why. Pt calming with reassurance and initiation of mobility and able to progress gait in room with RW for support and up to mod A to maintain balance and facilitate anterior momentum with ambulation. Pt continues to be limited in safe mobility by decreased activity tolerance, impaired balance/postural reactions and general weakness. Pt maintaining SpO2 >90% on RA throughout mobility however RR elevated into 40s despite cues for PLB. Pt continues to benefit from skilled PT services to progress toward functional mobility goals.     If plan is discharge home, recommend the following: A little help with walking and/or transfers;A little help with bathing/dressing/bathroom;Assistance with cooking/housework;Assist for transportation;Help with stairs or ramp for entrance;Supervision due to cognitive status   Can travel by private vehicle     Yes  Equipment Recommendations  None recommended by PT    Recommendations for Other Services       Precautions / Restrictions Precautions Precautions: Fall Recall of Precautions/Restrictions: Impaired Restrictions Weight Bearing Restrictions Per Provider Order: No     Mobility  Bed Mobility Overal bed mobility: Needs Assistance Bed Mobility: Supine to Sit, Sit to Supine      Supine to sit: HOB elevated, Min assist Sit to supine: Min assist   General bed mobility comments: pt sitting up EO L side of bed with min A to manage trunk and min A to return LEs to bed at end of session    Transfers Overall transfer level: Needs assistance Equipment used: Rolling walker (2 wheels) Transfers: Sit to/from Stand Sit to Stand: From elevated surface, Min assist           General transfer comment: min A for balance, singificant cues needed for safety and problem solving    Ambulation/Gait Ambulation/Gait assistance: Mod assist Gait Distance (Feet): 32 Feet Assistive device: Rolling walker (2 wheels) Gait Pattern/deviations: Step-through pattern, Decreased stride length, Drifts right/left Gait velocity: decr     General Gait Details: Pt ambulated within room by taking slow steps. Cues for improved sequencing and safety awareness. mod A to maintain balance and facilitate anterior momentum   Stairs             Wheelchair Mobility     Tilt Bed    Modified Rankin (Stroke Patients Only)       Balance Overall balance assessment: Needs assistance, History of Falls Sitting-balance support: Bilateral upper extremity supported, Feet supported Sitting balance-Leahy Scale: Fair     Standing balance support: Bilateral upper extremity supported, During functional activity, Reliant on assistive device for balance Standing balance-Leahy Scale: Poor                              Communication Communication Communication: No apparent difficulties  Cognition Arousal: Alert Behavior During Therapy: Restless, Anxious   PT - Cognitive impairments: No family/caregiver present to  determine baseline                         Following commands: Impaired Following commands impaired: Follows one step commands inconsistently    Cueing Cueing Techniques: Verbal cues, Gestural cues  Exercises      General Comments General comments  (skin integrity, edema, etc.): pt with increased RR on arrival, SpO2 was 90-95% on RA throughout session. HR up to 134bpm with activity. Pt placed back on 2L with 100% SpO2, and HR went to 91 with return to supine      Pertinent Vitals/Pain Pain Assessment Pain Assessment: No/denies pain Pain Intervention(s): Monitored during session    Home Living                          Prior Function            PT Goals (current goals can now be found in the care plan section) Acute Rehab PT Goals PT Goal Formulation: With patient Time For Goal Achievement: 07/19/24 Progress towards PT goals: Progressing toward goals    Frequency    Min 2X/week      PT Plan      Co-evaluation              AM-PAC PT 6 Clicks Mobility   Outcome Measure  Help needed turning from your back to your side while in a flat bed without using bedrails?: A Little Help needed moving from lying on your back to sitting on the side of a flat bed without using bedrails?: A Little Help needed moving to and from a bed to a chair (including a wheelchair)?: A Little Help needed standing up from a chair using your arms (e.g., wheelchair or bedside chair)?: A Little Help needed to walk in hospital room?: A Lot Help needed climbing 3-5 steps with a railing? : A Lot 6 Click Score: 16    End of Session   Activity Tolerance: Patient tolerated treatment well;Patient limited by fatigue Patient left: in bed;with call bell/phone within reach;with bed alarm set;Other (comment) (with fall mats replaced) Nurse Communication: Mobility status PT Visit Diagnosis: History of falling (Z91.81);Repeated falls (R29.6);Unsteadiness on feet (R26.81);Other abnormalities of gait and mobility (R26.89)     Time: 8642-8576 PT Time Calculation (min) (ACUTE ONLY): 26 min  Charges:    $Gait Training: 8-22 mins $Therapeutic Activity: 8-22 mins PT General Charges $$ ACUTE PT VISIT: 1 Visit                     Early Ord R.  PTA Acute Rehabilitation Services Office: [phone]   Therisa CHRISTELLA Boor 07/08/2024, 2:39 PM

## 2024-07-09 ENCOUNTER — Other Ambulatory Visit

## 2024-07-09 ENCOUNTER — Inpatient Hospital Stay

## 2024-07-09 ENCOUNTER — Inpatient Hospital Stay: Admitting: Hematology

## 2024-07-09 ENCOUNTER — Inpatient Hospital Stay (HOSPITAL_COMMUNITY)

## 2024-07-09 DIAGNOSIS — R0600 Dyspnea, unspecified: Secondary | ICD-10-CM | POA: Diagnosis not present

## 2024-07-09 DIAGNOSIS — R918 Other nonspecific abnormal finding of lung field: Secondary | ICD-10-CM | POA: Diagnosis not present

## 2024-07-09 DIAGNOSIS — N390 Urinary tract infection, site not specified: Secondary | ICD-10-CM | POA: Diagnosis not present

## 2024-07-09 DIAGNOSIS — Z452 Encounter for adjustment and management of vascular access device: Secondary | ICD-10-CM | POA: Diagnosis not present

## 2024-07-09 DIAGNOSIS — A419 Sepsis, unspecified organism: Secondary | ICD-10-CM | POA: Diagnosis not present

## 2024-07-09 LAB — CBC WITH DIFFERENTIAL/PLATELET
Abs Immature Granulocytes: 0.05 K/uL (ref 0.00–0.07)
Basophils Absolute: 0.1 K/uL (ref 0.0–0.1)
Basophils Relative: 2 %
Eosinophils Absolute: 0 K/uL (ref 0.0–0.5)
Eosinophils Relative: 0 %
HCT: 32.5 % — ABNORMAL LOW (ref 39.0–52.0)
Hemoglobin: 9.9 g/dL — ABNORMAL LOW (ref 13.0–17.0)
Immature Granulocytes: 1 %
Lymphocytes Relative: 19 %
Lymphs Abs: 1.1 K/uL (ref 0.7–4.0)
MCH: 23.8 pg — ABNORMAL LOW (ref 26.0–34.0)
MCHC: 30.5 g/dL (ref 30.0–36.0)
MCV: 78.1 fL — ABNORMAL LOW (ref 80.0–100.0)
Monocytes Absolute: 2.2 K/uL — ABNORMAL HIGH (ref 0.1–1.0)
Monocytes Relative: 41 %
Neutro Abs: 2 K/uL (ref 1.7–7.7)
Neutrophils Relative %: 37 %
Platelets: 278 K/uL (ref 150–400)
RBC: 4.16 MIL/uL — ABNORMAL LOW (ref 4.22–5.81)
RDW: 23.2 % — ABNORMAL HIGH (ref 11.5–15.5)
WBC: 5.5 K/uL (ref 4.0–10.5)
nRBC: 1.8 % — ABNORMAL HIGH (ref 0.0–0.2)

## 2024-07-09 LAB — GLUCOSE, CAPILLARY
Glucose-Capillary: 187 mg/dL — ABNORMAL HIGH (ref 70–99)
Glucose-Capillary: 187 mg/dL — ABNORMAL HIGH (ref 70–99)
Glucose-Capillary: 201 mg/dL — ABNORMAL HIGH (ref 70–99)
Glucose-Capillary: 221 mg/dL — ABNORMAL HIGH (ref 70–99)
Glucose-Capillary: 224 mg/dL — ABNORMAL HIGH (ref 70–99)
Glucose-Capillary: 292 mg/dL — ABNORMAL HIGH (ref 70–99)

## 2024-07-09 LAB — BASIC METABOLIC PANEL WITH GFR
Anion gap: 7 (ref 5–15)
BUN: 27 mg/dL — ABNORMAL HIGH (ref 8–23)
CO2: 21 mmol/L — ABNORMAL LOW (ref 22–32)
Calcium: 8.2 mg/dL — ABNORMAL LOW (ref 8.9–10.3)
Chloride: 112 mmol/L — ABNORMAL HIGH (ref 98–111)
Creatinine, Ser: 2.24 mg/dL — ABNORMAL HIGH (ref 0.61–1.24)
GFR, Estimated: 29 mL/min — ABNORMAL LOW (ref >60)
Glucose, Bld: 213 mg/dL — ABNORMAL HIGH (ref 70–99)
Potassium: 3.7 mmol/L (ref 3.5–5.1)
Sodium: 140 mmol/L (ref 135–145)

## 2024-07-09 MED ORDER — ENOXAPARIN SODIUM 30 MG/0.3ML IJ SOSY
30.0000 mg | PREFILLED_SYRINGE | INTRAMUSCULAR | Status: DC
  Administered 2024-07-09 – 2024-07-10 (×2): 30 mg via SUBCUTANEOUS
  Filled 2024-07-09 (×2): qty 0.3

## 2024-07-09 MED ORDER — FUROSEMIDE 40 MG PO TABS
40.0000 mg | ORAL_TABLET | Freq: Once | ORAL | Status: AC
  Administered 2024-07-09: 40 mg via ORAL
  Filled 2024-07-09: qty 1

## 2024-07-09 MED ORDER — HALOPERIDOL LACTATE 5 MG/ML IJ SOLN
2.0000 mg | Freq: Once | INTRAMUSCULAR | Status: AC
  Administered 2024-07-09: 2 mg via INTRAVENOUS
  Filled 2024-07-09: qty 1

## 2024-07-09 NOTE — TOC Progression Note (Signed)
 Transition of Care [facility]) - Progression Note    Patient Details  Name: Adam Harris MRN: [id number] Date of Birth: 1946-06-25  Transition of [facility]) CM/SW Contact  Bridget Cordella Simmonds, LCSW Phone Number: 07/09/2024, 3:11 PM  Clinical Narrative:   [facility] cannot offer bed.  CSW spoke with [name] and he will accept offer at [place].  SNF auth request submitted in [place].       Barriers to Discharge: Continued Medical Work up               Expected Discharge Plan and Services   Discharge Planning Services: CM Consult   Living arrangements for the past 2 months: Single Family Home                                       Social Drivers of Health (SDOH) Interventions SDOH Screenings   Food Insecurity: No Food Insecurity (06/28/2024)  Housing: Low Risk  (06/28/2024)  Transportation Needs: No Transportation Needs (06/28/2024)  Utilities: Not At Risk (06/28/2024)  Alcohol  Screen: Low Risk  (08/06/2023)  Depression (PHQ2-9): Low Risk  (06/25/2024)  Financial Resource Strain: Low Risk  (08/06/2023)  Physical Activity: Inactive (08/06/2023)  Social Connections: Moderately Integrated (06/28/2024)  Stress: No Stress Concern Present (08/06/2023)  Tobacco Use: Medium Risk (06/27/2024)  Health Literacy: Adequate Health Literacy (08/06/2023)    Readmission Risk Interventions    07/02/2024   12:36 PM 05/26/2024    3:33 PM  Readmission Risk Prevention Plan  Transportation Screening Complete Complete  PCP or Specialist Appt within 3-5 Days  Complete  HRI or Home Care Consult  Complete  Social Work Consult for Recovery Care Planning/Counseling  Complete  Palliative Care Screening  Not Applicable  Medication Review [facility])  Referral to Pharmacy  PCP or Specialist appointment within 3-5 days of discharge Complete   HRI or Home Care Consult Complete   Palliative Care Screening Complete

## 2024-07-09 NOTE — Progress Notes (Signed)
 Daily Progress Note   Date: 07/09/2024   Patient Name: Adam Harris  DOB: Nov 08, 1946  MRN: [id number]  Age / Sex: 78 y.o., male  Attending Physician: Trixie Nilda HERO, MD Primary Care Physician: [name], MD Admit Date: 06/27/2024 Length of Stay: 11 days  Reason for Follow-up: Establishing goals of care  Past Medical History:  Diagnosis Date   Abnormal radiologic findings on diagnostic imaging of renal pelvis, ureter, or bladder    bilateral ureter abnormalities   Anticoagulant long-term use    eliquis    Anxiety    pt denies   Arthritis    Atrial fibrillation, chronic (HCC)    CAD (coronary artery disease) cardiologist-- dr hochrein   NSTEMI 02-04-2014  per cardiac cath chronic occluded RCA w/ faint left-to-right collaterals and aneurysmal LCFx with sluggish coronary flow/   NSTEMI --11-21-2016 per cardiac cath occluded proximal RCA & mid to diastal CFX 100%, med rx. If that does not work, PTCA or CABG   [facility]cer [facility])    bladder and kidney   CHF (congestive heart failure) (HCC)    CKD (chronic kidney disease), stage III (HCC)    patient unaware   DOE (dyspnea on exertion)    Fatty liver    pt denies   Hematuria 02/2019   History of COVID-19 10/2019   History of non-ST elevation myocardial infarction (NSTEMI)    02-04-2014  and 11-21-2016  cardiac cath done both times ,  medically management   History of shingles 12/2017   slight pain and numbness still noted in the area   Hyperlipidemia    Hypertension    Insomnia    Myocardial infarction 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 Kennyth   Thoracic aortic atherosclerosis (HCC)    Type 2 diabetes mellitus (HCC)    Urinary frequency     Subjective:   Subjective: Chart Reviewed. Updates received. Patient Assessed. Created space and opportunity for patient  and family to  explore thoughts and feelings regarding current medical situation.  Today's Discussion: Today before meeting with the patient/family, I reviewed the chart notes including progress notes from hospitalist. I also reviewed vital signs, nursing flowsheets, medication administrations record, labs, and imaging. - Discussed with patient and son about completing MOST form - Enquires about assistance with patient's son Adam Harris) who is reportedly schizophrenic and is unsafe for patient and his wife, would like guidance on who to call - Discussed current plan with patient and son and both were in agreement with current goals of going to SNF and continuing antibiotics - Understands that patient is not in a ready to continue with chemotherapy for now, but would like to discuss alternative methods such as Ivermectin with oncology when the time is appropriate  Review of Systems  Constitutional:  Positive for appetite change and fatigue.  Respiratory:  Positive for shortness of breath.     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  HENT:     Head: Normocephalic.     Mouth/Throat:     Mouth: Mucous membranes are dry.  Eyes:     Extraocular Movements: Extraocular movements intact.  Abdominal:     General: There is distension.     Palliative  Assessment/Data: 60%    Advanced Care Planning:   Existing Vynca/ACP Documentation: HCPOA, MOST form completed today  Primary Decision Maker: PATIENT, HCPOA son Adam Harris)  Pertinent diagnosis: Metastatic urothelial carcinoma with percutaneous nephrostomy, HTN, HLD, atrial fibrillation, CAD   The patient and family consented to a voluntary Advance Care Planning Conversation in person. Individuals present for the conversation:  Summary of the conversation: Educated patient on MOST form and completed it with consent of patient.   Outcome of the conversations and/or documents completed: MOST form completed  I spent 15 minutes providing separately identifiable ACP services with the patient and/or surrogate decision maker in a voluntary, in-person conversation discussing the patient's wishes and goals as detailed in the above note.  Assessment & Plan:   HPI/Patient Profile:  78 yo male with HTN, HLD, atrial fibrillation, CAD, and metastatic urothelial carcinoma w/ PCN admitted for MRSA and E. Faecalis bacteremia.   SUMMARY OF RECOMMENDATIONS   Continue current goals of getting patient to SNF with rehab and encourage PO  Symptom Management:  Oxy 5 mg q6 PRN Tylenol  650 mg q6 PRN  Code Status: DNR - Limited (DNR/DNI)  Prognosis: Unable to determine  Discharge Planning: SNF  Discussed with: Gherghe MD  Thank you for allowing us  to participate in the care of Adam Harris PMT will continue to support holistically.  Time Total:  35 mins  Detailed review of medical records (labs, imaging, vital signs), medically appropriate exam, discussed with treatment team, counseling and education to patient, family, & staff, documenting clinical information, medication management, coordination of care  Fairy FORBES Shan DEVONNA  Palliative Medicine Team  Team Phone # [phone] (Nights/Weekends) 07/09/2024 5:07 PM

## 2024-07-09 NOTE — Progress Notes (Signed)
 PROGRESS NOTE  Adam Harris FMW:[id number] DOB: [birth year] DOA: 06/27/2024 PCP: [name], MD   LOS: 11 days   Brief Narrative / Interim history: Adam Harris is a 78 y.o. 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 He was found to have sepsis from MRSA Bacteremia and enterococcus faecalis infection.   Subjective / 24h Interval events: Daughter is at bedside, she tells me that he has been breathing a little bit harder.  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   Nephrostomy present (HCC)   Permanent atrial fibrillation (HCC)   Chronic diastolic CHF (congestive heart failure), NYHA class 2 (HCC)   Essential hypertension   Type 2 diabetes mellitus with hyperglycemia (HCC)   Chronic kidney disease, stage 3b (HCC)   Microcytic anemia   Hyperlipidemia   Moderate protein-calorie malnutrition (HCC)   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  He underwent right IJ tunneled catheter for this.  Active problems Permament atrial fibrillation - Rate better. Increased the metoprolol  to 50 mg BID with better control of  rate. Not on anti coagulation.    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  Continue pain control, discontinue morphine  given CKD and increased confusion today   Metabolic encephalopathy-patient with increasing confusion overnight, received morphine  last night.  Daughter is unsure why he received morphine  since does not think he was in pain   Hypertension -blood pressure stable  Microcytic anemia - Hemoglobin stable around 10.    Heart failure with preserved left ventricular ejection fraction - Last echo showed left ventricular ejection fraction of 60 to 65%.  Slight fluid overload today, will give Lasix  p.o. x 1   CKD stage IV, metabolic acidosis secondary to sepsis -appears that baseline creatinine has been 2.0-2.4 for a number of months as an outpatient.  Varying here, but overall stable, 2.2 today   DM2, poorly controlled, with hyperglycemia-continue sliding scale  Lab Results  Component Value Date   HGBA1C 8.3 (H) 05/19/2024   CBG (last 3)  Recent Labs    07/08/24 2322 07/09/24 0414 07/09/24 0823  GLUCAP 152* 292* 224*    Scheduled Meds:  artificial tears  2 drop Both Eyes QID   aspirin  EC  81 mg Oral Daily   calcium  carbonate  1 tablet Oral TID WC   Chlorhexidine  Gluconate Cloth  6 each Topical Daily   enoxaparin  (LOVENOX ) injection  30 mg Subcutaneous Q24H   feeding supplement  237 mL Oral BID BM   Gerhardt's butt cream   Topical BID   hydrocortisone  cream   Topical BID  insulin  aspart  0-9 Units Subcutaneous TID WC   isosorbide  mononitrate  15 mg Oral Daily   linaclotide   145 mcg Oral QAC breakfast   metoprolol  tartrate  50 mg Oral BID   mirtazapine   15 mg Oral QHS   sodium bicarbonate   650 mg Oral BID   sodium chloride  flush  3 mL Intravenous Q12H   tamsulosin   0.4 mg Oral Daily   Continuous Infusions:   DAPTOmycin  Stopped (07/08/24 1653)   PRN Meds:.acetaminophen  **OR** acetaminophen , albuterol , diphenhydrAMINE -zinc  acetate, hydrOXYzine , metoprolol  tartrate, ondansetron  **OR** ondansetron  (ZOFRAN ) IV, oxyCODONE , polyethylene glycol  Current Outpatient Medications  Medication Instructions   aspirin  EC 81 mg, Oral, Daily, Swallow whole.   Carboxymethylcell-Glycerin  PF 0.5-0.9 % SOLN 2 drops, Both Eyes, 4 times daily   hydrOXYzine  (VISTARIL ) 25 mg, Oral, Every 8 hours PRN   isosorbide  mononitrate (IMDUR ) 30 mg, Oral, Daily   lidocaine -prilocaine  (EMLA ) cream Apply to affected area once   linaclotide  (LINZESS ) 145 mcg, Oral, Daily before breakfast   megestrol  ([name]  ES) 625 mg, Oral, Daily   metoprolol  tartrate (LOPRESSOR ) 25 mg, Oral, 2 times daily   mirtazapine  (REMERON ) 30 mg, Oral, Daily at bedtime   olmesartan  (BENICAR ) 20 mg, Oral, Daily   ondansetron  (ZOFRAN ) 8 mg, Oral, Every 8 hours PRN   oxyCODONE  (OXY IR/ROXICODONE ) 5-10 mg, Oral, Every 4 hours PRN   polyethylene glycol (MIRALAX  / GLYCOLAX ) 17 g, Oral, Daily PRN   prochlorperazine  (COMPAZINE ) 10 mg, Oral, Every 6 hours PRN   rosuvastatin  (CRESTOR ) 40 mg, Oral, BH-each morning, Please keep scheduled appointment   sitaGLIPtin  (JANUVIA ) 50 mg, Oral, Daily   sodium bicarbonate  650 mg, 2 times daily   tamsulosin  (FLOMAX ) 0.4 mg, Oral, Daily   traZODone  (DESYREL ) 25-50 mg, Oral, At bedtime PRN    Diet Orders (From admission, onward)     Start     Ordered   07/05/24 1450  Diet regular Room service appropriate? Yes; Fluid consistency: Thin  Diet effective now       Comments: Add magic cup please  Question Answer Comment  Room service appropriate? Yes   Fluid consistency: Thin      07/05/24 1450            DVT prophylaxis: enoxaparin  (LOVENOX ) injection 30 mg Start: 07/09/24 1000   Lab Results  Component Value Date   PLT 278 07/09/2024      Code Status: Limited: Do not attempt resuscitation (DNR) -DNR-LIMITED -Do  Not Intubate/DNI   Family Communication: Daughter present at bedside  Status is: Inpatient Remains inpatient appropriate because: Severity of illness  Level of care: Telemetry Medical  Consultants:  ID Urology  Objective: Vitals:   07/09/24 0329 07/09/24 0442 07/09/24 0600 07/09/24 0819  BP:  134/89  (!) 138/109  Pulse:  93  67  Resp:  (!) 21  17  Temp:  98.8 F (37.1 C)  98.6 F (37 C)  TempSrc:  Axillary  Oral  SpO2: 98% 99%  99%  Weight:   86.7 kg   Height:        Intake/Output Summary (Last 24 hours) at 07/09/2024 0923 Last data filed at 07/08/2024 1840 Gross per 24 hour  Intake 120 ml  Output 350 ml  Net -230 ml   Wt Readings from Last 3 Encounters:  07/09/24 86.7 kg  06/27/24 89.8 kg  06/26/24 90 kg    Examination:  Constitutional: NAD Eyes: no scleral icterus ENMT: Mucous membranes are moist.  Neck: normal, supple Respiratory: clear to auscultation  bilaterally, no wheezing, no crackles.  Cardiovascular: Regular rate and rhythm, no murmurs / rubs / gallops.  Abdomen: non distended, no tenderness. Bowel sounds positive.  Musculoskeletal: no clubbing / cyanosis.    Data Reviewed: I have independently reviewed following labs and imaging studies   CBC Recent Labs  Lab 07/03/24 0443 07/04/24 0347 07/06/24 1218 07/07/24 0543 07/09/24 0045  WBC 9.1 10.2 5.1 5.5 5.5  HGB 10.5* 10.1* 10.1* 10.7* 9.9*  HCT 33.5* 32.6* 33.1* 35.2* 32.5*  PLT 239 241 349 358 278  MCV 74.3* 74.8* 76.4* 76.9* 78.1*  MCH 23.3* 23.2* 23.3* 23.4* 23.8*  MCHC 31.3 31.0 30.5 30.4 30.5  RDW 20.9* 21.1* 22.1* 22.8* 23.2*  LYMPHSABS 0.9 1.1 0.5*  --  1.1  MONOABS 0.7 0.9 0.2  --  2.2*  EOSABS 0.1 0.1 0.0  --  0.0  BASOSABS 0.1 0.1 0.1  --  0.1    Recent Labs  Lab 07/03/24 0443 07/04/24 0347 07/06/24 1218 07/06/24 1321 07/07/24 0543 07/09/24 0045  NA 138 141 141  --  142 140  K 3.9 4.2 3.8  --  4.2 3.7  CL 107 117* 110  --  114* 112*  CO2 21* 19* 20*  --  19* 21*   GLUCOSE 117* 108* 144*  --  145* 213*  BUN 35* 32* 24*  --  25* 27*  CREATININE 1.99* 1.87* 1.85*  --  1.98* 2.24*  CALCIUM  8.4* 8.7* 8.3*  --  8.4* 8.2*  MG  --   --   --  1.9 1.9  --     ------------------------------------------------------------------------------------------------------------------ No results for input(s): CHOL, HDL, LDLCALC, TRIG, CHOLHDL, LDLDIRECT in the last 72 hours.  Lab Results  Component Value Date   HGBA1C 8.3 (H) 05/19/2024   ------------------------------------------------------------------------------------------------------------------ No results for input(s): TSH, T4TOTAL, T3FREE, THYROIDAB in the last 72 hours.  Invalid input(s): FREET3  Cardiac Enzymes No results for input(s): CKMB, TROPONINI, MYOGLOBIN in the last 168 hours.  Invalid input(s): CK ------------------------------------------------------------------------------------------------------------------    Component Value Date/Time   BNP 1,069.5 (H) 11/24/2022 0916    CBG: Recent Labs  Lab 07/08/24 1552 07/08/24 2037 07/08/24 2322 07/09/24 0414 07/09/24 0823  GLUCAP 108* 156* 152* 292* 224*    Recent Results (from the past 240 hours)  Culture, blood (Routine X 2) w Reflex to ID Panel     Status: None   Collection Time: 06/30/24  2:14 PM   Specimen: BLOOD  Result Value Ref Range Status   Specimen Description BLOOD SITE NOT SPECIFIED  Final   Special Requests   Final    BOTTLES DRAWN AEROBIC ONLY Blood Culture adequate volume   Culture   Final    NO GROWTH 5 DAYS Performed at [facility], [address], [place], [place] [address]    Report Status 07/05/2024 FINAL  Final  Culture, blood (Routine X 2) w Reflex to ID Panel     Status: None   Collection Time: 06/30/24  2:15 PM   Specimen: BLOOD LEFT HAND  Result Value Ref Range Status   Specimen Description BLOOD LEFT HAND  Final   Special Requests   Final    BOTTLES DRAWN AEROBIC  AND ANAEROBIC Blood Culture adequate volume   Culture   Final    NO GROWTH 5 DAYS Performed at [facility], [address] [place], [place] [address]    Report Status 07/05/2024 FINAL  Final  Culture, blood (Routine X 2) w Reflex to ID  Panel     Status: None (Preliminary result)   Collection Time: 07/07/24  6:34 AM   Specimen: BLOOD  Result Value Ref Range Status   Specimen Description BLOOD RIGHT ANTECUBITAL  Final   Special Requests   Final    BOTTLES DRAWN AEROBIC ONLY Blood Culture adequate volume   Culture   Final    NO GROWTH 2 DAYS Performed at [facility], [address], [place], [place] [address]    Report Status PENDING  Incomplete  Culture, blood (Routine X 2) w Reflex to ID Panel     Status: None (Preliminary result)   Collection Time: 07/07/24  6:35 AM   Specimen: BLOOD  Result Value Ref Range Status   Specimen Description BLOOD LEFT ANTECUBITAL  Final   Special Requests   Final    BOTTLES DRAWN AEROBIC ONLY Blood Culture adequate volume   Culture   Final    NO GROWTH 2 DAYS Performed at [facility], [address] [place], [place] [address]    Report Status PENDING  Incomplete     Radiology Studies: DG CHEST PORT 1 VIEW Result Date: 07/09/2024 EXAM: 1 VIEW XRAY OF THE CHEST 07/09/2024 07:57:47 AM COMPARISON: AP radiograph of chest dated 06/29/2024. CLINICAL HISTORY: Dyspnea. FINDINGS: LUNGS AND PLEURA: Prominent reticular nodular opacities present within the lungs bilaterally. Mild hazy appearance of the lung bases. No pleural effusion. No pneumothorax. HEART AND MEDIASTINUM: No acute abnormality of the cardiac and mediastinal silhouettes. Patient is status post closure of the left atrial appendage. BONES AND SOFT TISSUES: No acute osseous abnormality. A right internal jugular tunneled catheter has been placed and its tip is near the junction of the superior vena cava and right atrium. IMPRESSION: 1. Prominent reticular nodular opacities  bilaterally and mild hazy appearance of the lung bases. The findings may represent mild edema or subtle pneumonitis. 2. Right internal jugular tunneled catheter in place with tip near the junction of the superior vena cava and right atrium. Electronically signed by: Evalene Coho MD 07/09/2024 08:10 AM EDT RP Workstation: [id number]     Nilda Fendt, MD, PhD [facility]  Between 7 am - 7 pm I am available, please contact me via Amion (for emergencies) or Securechat (non urgent messages)  Between 7 pm - 7 am I am not available, please contact night coverage MD/APP via Amion

## 2024-07-09 NOTE — Progress Notes (Signed)
 Tele sitter called to DC safety observation per Dr. Trixie to DC

## 2024-07-09 NOTE — Plan of Care (Signed)

## 2024-07-10 DIAGNOSIS — I10 Essential (primary) hypertension: Secondary | ICD-10-CM | POA: Diagnosis not present

## 2024-07-10 DIAGNOSIS — I110 Hypertensive heart disease with heart failure: Secondary | ICD-10-CM | POA: Diagnosis not present

## 2024-07-10 DIAGNOSIS — I5032 Chronic diastolic (congestive) heart failure: Secondary | ICD-10-CM | POA: Diagnosis not present

## 2024-07-10 DIAGNOSIS — I4821 Permanent atrial fibrillation: Secondary | ICD-10-CM | POA: Diagnosis present

## 2024-07-10 DIAGNOSIS — A419 Sepsis, unspecified organism: Secondary | ICD-10-CM | POA: Diagnosis not present

## 2024-07-10 DIAGNOSIS — R918 Other nonspecific abnormal finding of lung field: Secondary | ICD-10-CM | POA: Diagnosis not present

## 2024-07-10 DIAGNOSIS — E43 Unspecified severe protein-calorie malnutrition: Secondary | ICD-10-CM | POA: Diagnosis present

## 2024-07-10 DIAGNOSIS — N184 Chronic kidney disease, stage 4 (severe): Secondary | ICD-10-CM | POA: Diagnosis present

## 2024-07-10 DIAGNOSIS — N136 Pyonephrosis: Secondary | ICD-10-CM | POA: Diagnosis present

## 2024-07-10 DIAGNOSIS — I130 Hypertensive heart and chronic kidney disease with heart failure and stage 1 through stage 4 chronic kidney disease, or unspecified chronic kidney disease: Secondary | ICD-10-CM | POA: Diagnosis present

## 2024-07-10 DIAGNOSIS — M6281 Muscle weakness (generalized): Secondary | ICD-10-CM | POA: Diagnosis not present

## 2024-07-10 DIAGNOSIS — E038 Other specified hypothyroidism: Secondary | ICD-10-CM | POA: Diagnosis present

## 2024-07-10 DIAGNOSIS — I4811 Longstanding persistent atrial fibrillation: Secondary | ICD-10-CM | POA: Diagnosis not present

## 2024-07-10 DIAGNOSIS — E1165 Type 2 diabetes mellitus with hyperglycemia: Secondary | ICD-10-CM | POA: Diagnosis present

## 2024-07-10 DIAGNOSIS — Z7901 Long term (current) use of anticoagulants: Secondary | ICD-10-CM | POA: Diagnosis not present

## 2024-07-10 DIAGNOSIS — N390 Urinary tract infection, site not specified: Secondary | ICD-10-CM | POA: Diagnosis not present

## 2024-07-10 DIAGNOSIS — E785 Hyperlipidemia, unspecified: Secondary | ICD-10-CM | POA: Diagnosis present

## 2024-07-10 DIAGNOSIS — C689 Malignant neoplasm of urinary organ, unspecified: Secondary | ICD-10-CM | POA: Diagnosis present

## 2024-07-10 DIAGNOSIS — R0689 Other abnormalities of breathing: Secondary | ICD-10-CM | POA: Diagnosis not present

## 2024-07-10 DIAGNOSIS — Z515 Encounter for palliative care: Secondary | ICD-10-CM | POA: Diagnosis not present

## 2024-07-10 DIAGNOSIS — E1122 Type 2 diabetes mellitus with diabetic chronic kidney disease: Secondary | ICD-10-CM | POA: Diagnosis present

## 2024-07-10 DIAGNOSIS — R0602 Shortness of breath: Secondary | ICD-10-CM | POA: Diagnosis not present

## 2024-07-10 DIAGNOSIS — R450 Nervousness: Secondary | ICD-10-CM | POA: Diagnosis not present

## 2024-07-10 DIAGNOSIS — I272 Pulmonary hypertension, unspecified: Secondary | ICD-10-CM | POA: Diagnosis present

## 2024-07-10 DIAGNOSIS — I517 Cardiomegaly: Secondary | ICD-10-CM | POA: Diagnosis not present

## 2024-07-10 DIAGNOSIS — I4891 Unspecified atrial fibrillation: Secondary | ICD-10-CM | POA: Diagnosis not present

## 2024-07-10 DIAGNOSIS — G9340 Encephalopathy, unspecified: Secondary | ICD-10-CM | POA: Diagnosis not present

## 2024-07-10 DIAGNOSIS — I5033 Acute on chronic diastolic (congestive) heart failure: Secondary | ICD-10-CM | POA: Diagnosis present

## 2024-07-10 DIAGNOSIS — I509 Heart failure, unspecified: Secondary | ICD-10-CM | POA: Diagnosis not present

## 2024-07-10 DIAGNOSIS — B9562 Methicillin resistant Staphylococcus aureus infection as the cause of diseases classified elsewhere: Secondary | ICD-10-CM | POA: Diagnosis present

## 2024-07-10 DIAGNOSIS — D72829 Elevated white blood cell count, unspecified: Secondary | ICD-10-CM | POA: Diagnosis not present

## 2024-07-10 DIAGNOSIS — R2681 Unsteadiness on feet: Secondary | ICD-10-CM | POA: Diagnosis not present

## 2024-07-10 DIAGNOSIS — I330 Acute and subacute infective endocarditis: Secondary | ICD-10-CM | POA: Diagnosis not present

## 2024-07-10 DIAGNOSIS — R4182 Altered mental status, unspecified: Secondary | ICD-10-CM | POA: Diagnosis not present

## 2024-07-10 DIAGNOSIS — R627 Adult failure to thrive: Secondary | ICD-10-CM | POA: Diagnosis present

## 2024-07-10 DIAGNOSIS — L89152 Pressure ulcer of sacral region, stage 2: Secondary | ICD-10-CM | POA: Diagnosis present

## 2024-07-10 DIAGNOSIS — D509 Iron deficiency anemia, unspecified: Secondary | ICD-10-CM | POA: Diagnosis present

## 2024-07-10 DIAGNOSIS — Z936 Other artificial openings of urinary tract status: Secondary | ICD-10-CM | POA: Diagnosis not present

## 2024-07-10 DIAGNOSIS — R457 State of emotional shock and stress, unspecified: Secondary | ICD-10-CM | POA: Diagnosis not present

## 2024-07-10 DIAGNOSIS — G928 Other toxic encephalopathy: Secondary | ICD-10-CM | POA: Diagnosis present

## 2024-07-10 DIAGNOSIS — R000 Tachycardia, unspecified: Secondary | ICD-10-CM | POA: Diagnosis present

## 2024-07-10 DIAGNOSIS — B952 Enterococcus as the cause of diseases classified elsewhere: Secondary | ICD-10-CM | POA: Diagnosis present

## 2024-07-10 DIAGNOSIS — N1832 Chronic kidney disease, stage 3b: Secondary | ICD-10-CM | POA: Diagnosis not present

## 2024-07-10 DIAGNOSIS — I2510 Atherosclerotic heart disease of native coronary artery without angina pectoris: Secondary | ICD-10-CM | POA: Diagnosis present

## 2024-07-10 DIAGNOSIS — Z87898 Personal history of other specified conditions: Secondary | ICD-10-CM | POA: Diagnosis not present

## 2024-07-10 DIAGNOSIS — Z66 Do not resuscitate: Secondary | ICD-10-CM | POA: Diagnosis present

## 2024-07-10 DIAGNOSIS — C770 Secondary and unspecified malignant neoplasm of lymph nodes of head, face and neck: Secondary | ICD-10-CM | POA: Diagnosis not present

## 2024-07-10 DIAGNOSIS — Z8616 Personal history of COVID-19: Secondary | ICD-10-CM | POA: Diagnosis not present

## 2024-07-10 LAB — BASIC METABOLIC PANEL WITH GFR
Anion gap: 9 (ref 5–15)
BUN: 20 mg/dL (ref 8–23)
CO2: 21 mmol/L — ABNORMAL LOW (ref 22–32)
Calcium: 8 mg/dL — ABNORMAL LOW (ref 8.9–10.3)
Chloride: 108 mmol/L (ref 98–111)
Creatinine, Ser: 1.62 mg/dL — ABNORMAL HIGH (ref 0.61–1.24)
GFR, Estimated: 43 mL/min — ABNORMAL LOW (ref >60)
Glucose, Bld: 205 mg/dL — ABNORMAL HIGH (ref 70–99)
Potassium: 3.1 mmol/L — ABNORMAL LOW (ref 3.5–5.1)
Sodium: 138 mmol/L (ref 135–145)

## 2024-07-10 LAB — GLUCOSE, CAPILLARY
Glucose-Capillary: 224 mg/dL — ABNORMAL HIGH (ref 70–99)
Glucose-Capillary: 315 mg/dL — ABNORMAL HIGH (ref 70–99)
Glucose-Capillary: 154 mg/dL — ABNORMAL HIGH (ref 70–99)
Glucose-Capillary: 219 mg/dL — ABNORMAL HIGH (ref 70–99)

## 2024-07-10 MED ORDER — METOPROLOL TARTRATE 50 MG PO TABS: 50.0000 mg | ORAL_TABLET | Freq: Two times a day (BID) | ORAL | Status: DC

## 2024-07-10 MED ORDER — POTASSIUM CHLORIDE CRYS ER 20 MEQ PO TBCR
40.0000 meq | EXTENDED_RELEASE_TABLET | Freq: Once | ORAL | Status: AC
  Administered 2024-07-10: 40 meq via ORAL
  Filled 2024-07-10: qty 2

## 2024-07-10 MED ORDER — POTASSIUM CHLORIDE CRYS ER 20 MEQ PO TBCR
20.0000 meq | EXTENDED_RELEASE_TABLET | Freq: Once | ORAL | Status: AC
  Administered 2024-07-10: 20 meq via ORAL
  Filled 2024-07-10: qty 1

## 2024-07-10 MED ORDER — DAPTOMYCIN IV (FOR PTA / DISCHARGE USE ONLY): 900.0000 mg | INTRAVENOUS | 0 refills | Status: DC

## 2024-07-10 MED ORDER — FUROSEMIDE 40 MG PO TABS: 40.0000 mg | ORAL_TABLET | Freq: Every day | ORAL | Status: DC | PRN

## 2024-07-10 MED ORDER — OXYCODONE HCL 5 MG PO TABS
5.0000 mg | ORAL_TABLET | ORAL | 0 refills | Status: DC | PRN
Start: 2024-07-10 — End: 2024-07-30

## 2024-07-10 MED ORDER — ENOXAPARIN SODIUM 40 MG/0.4ML IJ SOSY: 40.0000 mg | PREFILLED_SYRINGE | INTRAMUSCULAR | Status: DC

## 2024-07-10 MED ORDER — FUROSEMIDE 40 MG PO TABS
40.0000 mg | ORAL_TABLET | Freq: Once | ORAL | Status: AC
  Administered 2024-07-10: 40 mg via ORAL
  Filled 2024-07-10: qty 1

## 2024-07-10 NOTE — Plan of Care (Signed)
  Problem: Education: Goal: Knowledge of General Education information will improve Description: Including pain rating scale, medication(s)/side effects and non-pharmacologic comfort measures Outcome: Progressing   Problem: Clinical Measurements: Goal: Respiratory complications will improve Outcome: Not Progressing Goal: Cardiovascular complication will be avoided Outcome: Progressing   Problem: Activity: Goal: Risk for activity intolerance will decrease Outcome: Progressing   Problem: Nutrition: Goal: Adequate nutrition will be maintained Outcome: Progressing   Problem: Pain Managment: Goal: General experience of comfort will improve and/or be controlled Outcome: Progressing

## 2024-07-10 NOTE — Progress Notes (Signed)
 Completed med details for AVS/discharge packet; Printed and placed with chart.  Patient not eligible for discharge lounge due to cognition issues.  Primary nurse Tina,LPN will complete rest of discharge; son providing transportation to ALF at 5pm.

## 2024-07-10 NOTE — Discharge Summary (Signed)
 Physician Discharge Summary  [name] FMW:[id number] DOB: 07/08/1946 DOA: 06/27/2024  PCP: [name], MD  Admit date: 06/27/2024 Discharge date: 07/10/2024  Admitted From: home Disposition:  SNF  Recommendations for Outpatient Follow-up:  Follow up with PCP in 1-2 weeks Please obtain BMP/CBC in one week  Home Health: none Equipment/Devices: none  Discharge Condition: stable CODE STATUS: DNR Diet Orders (From admission, onward)     Start     Ordered   07/05/24 1450  Diet regular Room service appropriate? Yes; Fluid consistency: Thin  Diet effective now       Comments: Add magic cup please  Question Answer Comment  Room service appropriate? Yes   Fluid consistency: Thin      07/05/24 1450            Brief Narrative / Interim history: Adam Harris is a 78 y.o. 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 He was found to have sepsis from MRSA Bacteremia and enterococcus faecalis infection.   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  He underwent right IJ tunneled catheter for this.   Active problems Permament atrial fibrillation - Rate better. Increased the metoprolol  to 50 mg BID with better control of rate. Not on anti coagulation.   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    Metabolic encephalopathy-patient with increasing confusion in the setting of morphine  use and CKD. Now off morphine , mental status much better.  Hypertension -blood pressure stable.  Microcytic anemia - Hemoglobin stable around 10.  Heart failure with preserved left ventricular ejection fraction - Last echo showed left ventricular ejection fraction of 60 to 65%.  Continue Lasix  as needed Hypokalemia - due to lasix , repleted prior to dc CKD stage IV, metabolic acidosis secondary to sepsis -appears that baseline creatinine has been 2.0-2.4 for a number of months as an outpatient.  Varying here, but overall stable, 1.6 with Lasix   DM2, poorly controlled, with hyperglycemia-continue home regimen  Sepsis ruled out   Discharge Instructions  Discharge Instructions     [facility] pharmacist to adjust dose for Vancomycin , Aminoglycosides and other anti-infective therapies as requested by physician.   Complete by: As directed    [facility] to provide Cath Flo 2mg    Complete by: As directed    Administer  for PICC line occlusion and as ordered by physician for other access device issues.   Anaphylaxis Kit: Provided to treat any anaphylactic reaction to the medication being provided to the patient if First Dose or when requested by physician   Complete by: As  directed    Epinephrine  1mg /ml vial / amp: Administer 0.3mg  (0.55ml) subcutaneously once for moderate to severe anaphylaxis, nurse to call physician and pharmacy when reaction occurs and call 911 if needed for immediate care   Diphenhydramine  50mg /ml IV vial: Administer 25-50mg  IV/IM PRN for first dose reaction, rash, itching, mild reaction, nurse to call physician and pharmacy when reaction occurs   Sodium Chloride  0.9% NS 500ml IV: Administer if needed for hypovolemic blood pressure drop or as ordered by physician after call to physician with anaphylactic reaction   Change dressing on IV access line weekly and PRN   Complete by: As directed    Flush IV access with Sodium Chloride  0.9% and Heparin  10 units/ml or 100 units/ml   Complete by: As directed    Home infusion instructions - [facility]   Complete by: As directed    Instructions: Flush IV access with Sodium Chloride  0.9% and Heparin  10units/ml or 100units/ml   Change dressing on IV access line: Weekly and PRN   Instructions Cath Flo 2mg : Administer for PICC Line occlusion and as ordered by physician for other access device   [facility] pharmacist to adjust dose for: Vancomycin , Aminoglycosides and other anti-infective therapies as requested by physician   Method of administration may be changed at the discretion of home infusion pharmacist based upon assessment of the patient and/or caregiver's ability to self-administer the medication ordered   Complete by: As directed       Allergies as of 07/10/2024       Reactions   Xarelto  [rivaroxaban ] Other (See Comments)   Skin Blisters         Medication List     STOP taking these medications    olmesartan  20 MG tablet Commonly known as: BENICAR        TAKE these medications    aspirin  EC 81 MG tablet Take 1 tablet (81 mg total) by mouth daily. Swallow whole.   Carboxymethylcell-Glycerin  PF 0.5-0.9 % Soln Place 2 drops into both eyes 4 (four) times  daily.   daptomycin  IVPB Commonly known as: CUBICIN  Inject 900 mg into the vein daily for 25 days. Indication:  MRSA/E faecalis bacteremia First Dose: Yes Last Day of Therapy:  07/28/24 Labs - Once weekly:  CBC/D, BMP, and CPK Labs - Once weekly: ESR and CRP Method of administration: IV Push Method of administration may be changed at the discretion of home infusion pharmacist based upon assessment of the patient and/or caregiver's ability to self-administer the medication ordered.   furosemide  40 MG tablet Commonly known as: LASIX  Take 1 tablet (40 mg total) by mouth daily as needed for fluid or edema.   hydrOXYzine  25 MG capsule Commonly known as: VISTARIL  Take 1 capsule (25 mg total) by mouth every 8 (eight) hours as needed.   isosorbide  mononitrate 30 MG 24 hr tablet Commonly known as: IMDUR  Take 1 tablet (30 mg total) by mouth daily.   lidocaine -prilocaine  cream Commonly known as: EMLA  Apply to affected area once   linaclotide  145 MCG Caps capsule Commonly known as: Linzess  Take 1 capsule (145 mcg total) by mouth daily before breakfast.   megestrol  625 MG/5ML suspension Commonly known as: [name]  ES Take 5 mLs (625 mg  total) by mouth daily.   metoprolol  tartrate 50 MG tablet Commonly known as: LOPRESSOR  Take 1 tablet (50 mg total) by mouth 2 (two) times daily. What changed:  medication strength how much to take   mirtazapine  30 MG tablet Commonly known as: Remeron  Take 1 tablet (30 mg total) by mouth at bedtime.   ondansetron  8 MG tablet Commonly known as: Zofran  Take 1 tablet (8 mg total) by mouth every 8 (eight) hours as needed for nausea or vomiting.   oxyCODONE  5 MG immediate release tablet Commonly known as: Oxy IR/ROXICODONE  Take 1 tablet (5 mg total) by mouth every 4 (four) hours as needed for severe pain (pain score 7-10). What changed: how much to take   polyethylene glycol 17 g packet Commonly known as: MIRALAX  / GLYCOLAX  Take 17 g by mouth daily  as needed for mild constipation.   prochlorperazine  10 MG tablet Commonly known as: COMPAZINE  Take 1 tablet (10 mg total) by mouth every 6 (six) hours as needed for nausea or vomiting.   rosuvastatin  40 MG tablet Commonly known as: CRESTOR  TAKE 1 TABLET (40 MG TOTAL) BY MOUTH EVERY MORNING. PLEASE KEEP SCHEDULED APPOINTMENT   sitaGLIPtin  50 MG tablet Commonly known as: Januvia  Take 1 tablet (50 mg total) by mouth daily.   sodium bicarbonate  650 MG tablet Take 650 mg by mouth 2 (two) times daily.   tamsulosin  0.4 MG Caps capsule Commonly known as: FLOMAX  TAKE ONE CAPSULE BY MOUTH DAILY   traZODone  50 MG tablet Commonly known as: DESYREL  Take 0.5-1 tablets (25-50 mg total) by mouth at bedtime as needed for sleep.               Discharge Care Instructions  (From admission, onward)           Start     Ordered   07/10/24 0000  Change dressing on IV access line weekly and PRN  (Home infusion instructions - [facility] )        07/10/24 1009            Follow-up Information     [name], MD Follow up.   Specialty: Family Medicine Contact information: [address] [place] [address] [phone]         [place], [facility] Follow up.   Specialty: Home Health Services Why: Home health provider Contact information: [address] [place] [address] [phone]                 Consultations: ID Urology  Procedures/Studies:  DG CHEST PORT 1 VIEW Result Date: 07/09/2024 EXAM: 1 VIEW XRAY OF THE CHEST 07/09/2024 07:57:47 AM COMPARISON: AP radiograph of chest dated 06/29/2024. CLINICAL HISTORY: Dyspnea. FINDINGS: LUNGS AND PLEURA: Prominent reticular nodular opacities present within the lungs bilaterally. Mild hazy appearance of the lung bases. No pleural effusion. No pneumothorax. HEART AND MEDIASTINUM: No acute abnormality of the cardiac and mediastinal silhouettes. Patient is  status post closure of the left atrial appendage. BONES AND SOFT TISSUES: No acute osseous abnormality. A right internal jugular tunneled catheter has been placed and its tip is near the junction of the superior vena cava and right atrium. IMPRESSION: 1. Prominent reticular nodular opacities bilaterally and mild hazy appearance of the lung bases. The findings may represent mild edema or subtle pneumonitis. 2. Right internal jugular tunneled catheter in place with tip near the junction of the superior vena cava and right atrium. Electronically signed by: Evalene  Hugh MD 07/09/2024 08:10 AM EDT RP Workstation: [id number]   IR TUNNELED CENTRAL VENOUS CATH PLC W IMG Result Date: 07/04/2024 CLINICAL DATA:  Pneumonia, bladder carcinoma, needs durable venous access for planned antibiotic regimen EXAM: TUNNELED CENTRAL VENOUS CATHETER PLACEMENT WITH ULTRASOUND AND FLUOROSCOPIC GUIDANCE TECHNIQUE: The procedure, risks, benefits, and alternatives were explained to the patient. Questions regarding the procedure were encouraged and answered. The patient understands and consents to the procedure. Patency of the right IJ vein was confirmed with ultrasound with image documentation. An appropriate skin site was determined. Region was prepped using maximum barrier technique including cap and mask, sterile gown, sterile gloves, large sterile sheet, and Chlorhexidine  as cutaneous antisepsis. The region was infiltrated locally with 1% lidocaine . Under real-time ultrasound guidance, the right IJ vein was accessed with a 21 gauge micropuncture needle; the needle tip within the vein was confirmed with ultrasound image documentation. 39F dual-lumen cuffed PowerLine tunneled from a right anterior chest wall approach to the dermatotomy site. Needle exchanged over the 018 guidewire for transitional dilator, through which the catheter which had been cut to 24 cm was advanced under intermittent fluoroscopy, positioned with its tip at the  cavoatrial junction. Spot chest radiograph confirms good catheter position. No pneumothorax. Catheter was flushed per protocol. Catheter secured externally with O Prolene suture. The right IJ dermatotomy site was closed with Dermabond. COMPLICATIONS: COMPLICATIONS None immediate FLUOROSCOPY TIME:  Radiation Exposure Index (as provided by the fluoroscopic device): 1.3 mGy air Kerma COMPARISON:  None Available. IMPRESSION: 1. Technically successful placement of tunneled right IJ tunneled dual-lumen power injectable catheter with ultrasound and fluoroscopic guidance. Ready for routine use. Electronically Signed   By: JONETTA Faes M.D.   On: 07/04/2024 15:30   US  EKG SITE RITE Result Date: 07/03/2024 If [facility] image not attached, placement could not be confirmed due to current cardiac rhythm.  ECHO TEE Result Date: 07/02/2024    TRANSESOPHOGEAL ECHO REPORT   Patient Name:   Adam Harris  Date of Exam: 07/02/2024 Medical Rec #:  [id number]  Height:       71.0 in Accession #:    [id number] Weight:       192.9 lb Date of Birth:  [birth year]   BSA:          2.076 m Patient Age:    78 years   BP:           153/97 mmHg Patient Gender: M          HR:           128 bpm. Exam Location:  Inpatient Procedure: Cardiac Doppler, Color Doppler and Transesophageal Echo (Both            Spectral and Color Flow Doppler were utilized during procedure). Indications:     Endocarditis R78.81  History:         Patient has prior history of Echocardiogram examinations, most                  recent 06/30/2024. CAD and Previous Myocardial Infarction, CKD,                  Arrythmias:Atrial Fibrillation; Risk Factors:Hypertension,                  Dyslipidemia, Diabetes and Former Smoker.  Sonographer:     Koleen Popper RDCS Referring Phys:  [id number] ZANE ADAMS Diagnosing Phys: Jerel Balding MD PROCEDURE: After discussion of the risks and benefits of a TEE, an informed consent  was obtained from the patient. Pediatric probe was used. Imaged were  obtained with the patient in a left lateral decubitus position. Sedation performed by different physician. The patient was monitored while under deep sedation. Anesthestetic sedation was provided intravenously by Anesthesiology: 245mg  of Propofol , 60mg  of Lidocaine . The patient developed no complications during the procedure.  IMPRESSIONS  1. Left ventricular ejection fraction, by estimation, is 65 to 70%. The left ventricle has normal function. The left ventricle has no regional wall motion abnormalities. There is moderate concentric left ventricular hypertrophy. Left ventricular diastolic function could not be evaluated.  2. Right ventricular systolic function is normal. The right ventricular size is normal. There is severely elevated pulmonary artery systolic pressure. The estimated right ventricular systolic pressure is 79.0 mmHg.  3. Well seated left atrial appendage occluder (Watchman) device without leak, thrombus or vegetation. Left atrial size was severely dilated. No left atrial/left atrial appendage thrombus was detected.  4. Right atrial size was mildly dilated.  5. The mitral valve is normal in structure. Trivial mitral valve regurgitation. No evidence of mitral stenosis.  6. Tricuspid valve regurgitation is moderate to severe.  7. The aortic valve is tricuspid. There is mild calcification of the aortic valve. There is mild thickening of the aortic valve. Aortic valve regurgitation is trivial. Aortic valve sclerosis is present, with no evidence of aortic valve stenosis.  8. There is mild (Grade II) plaque involving the aortic arch and descending aorta. Conclusion(s)/Recommendation(s): No evidence of vegetation/infective endocarditis on this transesophageael echocardiogram. FINDINGS  Left Ventricle: Left ventricular ejection fraction, by estimation, is 65 to 70%. The left ventricle has normal function. The left ventricle has no regional wall motion abnormalities. The left ventricular internal cavity size  was normal in size. There is  moderate concentric left ventricular hypertrophy. Left ventricular diastolic function could not be evaluated due to atrial fibrillation. Left ventricular diastolic function could not be evaluated. Right Ventricle: The right ventricular size is normal. No increase in right ventricular wall thickness. Right ventricular systolic function is normal. There is severely elevated pulmonary artery systolic pressure. The tricuspid regurgitant velocity is 4.00 m/s, and with an assumed right atrial pressure of 15 mmHg, the estimated right ventricular systolic pressure is 79.0 mmHg. Left Atrium: Well seated left atrial appendage occluder (Watchman) device without leak, thrombus or vegetation. Left atrial size was severely dilated. Spontaneous echo contrast was present in the left atrium. No left atrial/left atrial appendage thrombus  was detected. Right Atrium: Right atrial size was mildly dilated. Pericardium: There is no evidence of pericardial effusion. Mitral Valve: The mitral valve is normal in structure. Trivial mitral valve regurgitation. No evidence of mitral valve stenosis. Tricuspid Valve: The tricuspid valve is grossly normal. Tricuspid valve regurgitation is moderate to severe. Aortic Valve: The aortic valve is tricuspid. There is mild calcification of the aortic valve. There is mild thickening of the aortic valve. Aortic valve regurgitation is trivial. Aortic valve sclerosis is present, with no evidence of aortic valve stenosis. Pulmonic Valve: The pulmonic valve was normal in structure. Pulmonic valve regurgitation is not visualized. Aorta: The aortic root, ascending aorta, aortic arch and descending aorta are all structurally normal, with no evidence of dilitation or obstruction. There is mild (Grade II) plaque involving the aortic arch and descending aorta. IAS/Shunts: The interatrial septum appears to be lipomatous. No atrial level shunt detected by color flow Doppler. Additional  Comments: Spectral Doppler performed. TRICUSPID VALVE TR Peak grad:   64.0 mmHg TR Vmax:  400.00 cm/s Jerel Balding MD Electronically signed by Jerel Balding MD Signature Date/Time: 07/02/2024/1:40:21 PM    Final    EP STUDY Result Date: 07/02/2024 See surgical note for result.  ECHOCARDIOGRAM COMPLETE BUBBLE STUDY Result Date: 06/30/2024    ECHOCARDIOGRAM REPORT   Patient Name:   Adam Harris  Date of Exam: 06/30/2024 Medical Rec #:  [id number]  Height:       71.0 in Accession #:    [id number] Weight:       198.4 lb Date of Birth:  [birth year]   BSA:          2.101 m Patient Age:    78 years   BP:           137/95 mmHg Patient Gender: M          HR:           112 bpm. Exam Location:  Inpatient Procedure: 2D Echo, Cardiac Doppler and Color Doppler (Both Spectral and Color            Flow Doppler were utilized during procedure). Indications:    Bacteremia [790.7.ICD-9-CM]  History:        Patient has prior history of Echocardiogram examinations, most                 recent 06/12/2022. Risk Factors:Hypertension.  Sonographer:    Benard Stallion Referring Phys: [id number] GREGORY D CALONE IMPRESSIONS  1. Left ventricular ejection fraction, by estimation, is 60 to 65%. The left ventricle has normal function. The left ventricle has no regional wall motion abnormalities. There is moderate asymmetric left ventricular hypertrophy of the basal-septal segment. Left ventricular diastolic parameters are indeterminate.  2. Right ventricular systolic function is normal. The right ventricular size is normal. There is severely elevated pulmonary artery systolic pressure. The estimated right ventricular systolic pressure is 77.4 mmHg.  3. Left atrial size was moderately dilated.  4. Right atrial size was mildly dilated.  5. Negative bubble study, no evidence for PFO or ASD.  6. The mitral valve is normal in structure. Trivial mitral valve regurgitation. No evidence of mitral stenosis.  7. Tricuspid valve regurgitation is  moderate.  8. The aortic valve is tricuspid. There is mild calcification of the aortic valve. Aortic valve regurgitation is not visualized. No aortic stenosis is present.  9. Aortic dilatation noted. There is borderline dilatation of the ascending aorta, measuring 38 mm. 10. The inferior vena cava is dilated in size with <50% respiratory variability, suggesting right atrial pressure of 15 mmHg. 11. The patient was in atrial fibrillation. 12. No valvular vegetation was visualized. FINDINGS  Left Ventricle: Left ventricular ejection fraction, by estimation, is 60 to 65%. The left ventricle has normal function. The left ventricle has no regional wall motion abnormalities. The left ventricular internal cavity size was normal in size. There is  moderate asymmetric left ventricular hypertrophy of the basal-septal segment. Left ventricular diastolic parameters are indeterminate. Right Ventricle: The right ventricular size is normal. No increase in right ventricular wall thickness. Right ventricular systolic function is normal. There is severely elevated pulmonary artery systolic pressure. The tricuspid regurgitant velocity is 3.95 m/s, and with an assumed right atrial pressure of 15 mmHg, the estimated right ventricular systolic pressure is 77.4 mmHg. Left Atrium: Left atrial size was moderately dilated. Right Atrium: Right atrial size was mildly dilated. Pericardium: There is no evidence of pericardial effusion. Mitral Valve: The mitral valve is normal in structure. Trivial mitral valve regurgitation. No evidence of mitral  valve stenosis. Tricuspid Valve: The tricuspid valve is normal in structure. Tricuspid valve regurgitation is moderate. Aortic Valve: The aortic valve is tricuspid. There is mild calcification of the aortic valve. Aortic valve regurgitation is not visualized. No aortic stenosis is present. Aortic valve mean gradient measures 2.0 mmHg. Aortic valve peak gradient measures 3.6 mmHg. Aortic valve area, by  VTI measures 3.63 cm. Pulmonic Valve: The pulmonic valve was normal in structure. Pulmonic valve regurgitation is trivial. Aorta: The aortic root is normal in size and structure and aortic dilatation noted. There is borderline dilatation of the ascending aorta, measuring 38 mm. Venous: The inferior vena cava is dilated in size with less than 50% respiratory variability, suggesting right atrial pressure of 15 mmHg. IAS/Shunts: Negative bubble study, no evidence for PFO or ASD.  LEFT VENTRICLE PLAX 2D LVIDd:         2.50 cm   Diastology LVIDs:         2.10 cm   LV e' medial:    6.85 cm/s LV PW:         1.20 cm   LV E/e' medial:  11.2 LV IVS:        1.20 cm   LV e' lateral:   8.81 cm/s LVOT diam:     2.20 cm   LV E/e' lateral: 8.7 LV SV:         57 LV SV Index:   27 LVOT Area:     3.80 cm  RIGHT VENTRICLE RV Basal diam:  3.50 cm RV Mid diam:    2.70 cm RV S prime:     9.61 cm/s TAPSE (M-mode): 1.6 cm LEFT ATRIUM             Index        RIGHT ATRIUM           Index LA diam:        3.90 cm 1.86 cm/m   RA Area:     22.00 cm LA Vol (A2C):   95.8 ml 45.59 ml/m  RA Volume:   58.60 ml  27.89 ml/m LA Vol (A4C):   81.3 ml 38.69 ml/m LA Biplane Vol: 91.9 ml 43.73 ml/m  AORTIC VALVE AV Area (Vmax):    3.45 cm AV Area (Vmean):   3.11 cm AV Area (VTI):     3.63 cm AV Vmax:           95.30 cm/s AV Vmean:          68.600 cm/s AV VTI:            0.156 m AV Peak Grad:      3.6 mmHg AV Mean Grad:      2.0 mmHg LVOT Vmax:         86.60 cm/s LVOT Vmean:        56.200 cm/s LVOT VTI:          0.149 m LVOT/AV VTI ratio: 0.96  AORTA Ao Root diam: 3.10 cm Ao Asc diam:  3.80 cm MITRAL VALVE               TRICUSPID VALVE MV Area (PHT): 6.96 cm    TR Peak grad:   62.4 mmHg MV Decel Time: 109 msec    TR Vmax:        395.00 cm/s MV E velocity: 76.40 cm/s MV A velocity: 27.10 cm/s  SHUNTS MV E/A ratio:  2.82        Systemic VTI:  0.15 m  Systemic Diam: 2.20 cm Dalton McleanMD Electronically signed by Ezra Kanner Signature Date/Time: 06/30/2024/9:06:21 PM    Final    CT CHEST WO CONTRAST Result Date: 06/29/2024 CLINICAL DATA:  Pneumonia suspected, uncomplicated, no prior imaging (Ped >= 70mo). History of urinary bladder cancer. EXAM: CT CHEST WITHOUT CONTRAST TECHNIQUE: Multidetector CT imaging of the chest was performed following the standard protocol without IV contrast. RADIATION DOSE REDUCTION: This exam was performed according to the departmental dose-optimization program which includes automated exposure control, adjustment of the mA and/or kV according to patient size and/or use of iterative reconstruction technique. COMPARISON:  PET CT 06/13/2024, CT chest 07/30/2024, chest x-ray 06/29/2024 FINDINGS: Cardiovascular: Normal heart size. No significant pericardial effusion. The thoracic aorta is normal in caliber. Severe atherosclerotic plaque of the thoracic aorta. Four-vessel coronary artery calcifications. Amplatzer device noted along the left atria. Mediastinum/Nodes: No gross hilar adenopathy, noting limited sensitivity for the detection of hilar adenopathy on this noncontrast study. Persistent multiple prominent but nonenlarged mediastinal lymph nodes. No enlarged mediastinal or axillary lymph nodes. Thyroid  gland, trachea, and esophagus demonstrate no significant findings. Lungs/Pleura: Interval development of left upper lobe patchy ground-glass airspace opacities more prominent along the dependent level. Similar findings along the left lower lobe. Redemonstration of chronic several scattered calcified micronodules along the bilateral lungs. Mild interlobular septal wall thickening. Along the apices. No pulmonary mass. Trace bilateral, left greater than right, pleural effusions. No pneumothorax. Upper Abdomen: Fluid dense lesion along the left superior renal pole suggestive of a simple renal cyst. Simple renal cysts, in the absence of clinically indicated signs/symptoms, require no independent  follow-up. Atherosclerotic plaque. Musculoskeletal: No abdominal wall hernia or abnormality.  Bilateral gynecomastia. No suspicious lytic or blastic osseous lesions. No acute displaced fracture. Multilevel degenerative changes of the spine. IMPRESSION: 1. Mild pulmonary edema with left upper lobe patchy ground-glass airspace opacities more prominent along the dependent level. Similar findings along the left lower lobe. Finding could represent superimposed developing infection/inflammation. 2. Trace bilateral, left greater than right, pleural effusions. 3. Redemonstration of colonic several scattered calcified micronodules along the bilateral lungs. Electronically Signed   By: Morgane  Naveau M.D.   On: 06/29/2024 13:58   DG CHEST PORT 1 VIEW Result Date: 06/29/2024 EXAM: 1 VIEW XRAY OF THE CHEST 06/29/2024 10:09:00 AM COMPARISON: 06/28/2024 CLINICAL HISTORY: Fluid excess. FINDINGS: LUNGS AND PLEURA: Interval increase in diffuse interstitial markings. No consolidative change. No significant pleural effusion or pneumothorax. HEART AND MEDIASTINUM: No acute abnormality of the cardiac and mediastinal silhouettes. Aortic atherosclerotic calcification. BONES AND SOFT TISSUES: No acute osseous abnormality. IMPRESSION: 1. Interval increase in diffuse interstitial markings. Favor pulmonary edema versus atypical infection. 2. No significant pleural effusion or pneumothorax. 3. No consolidative change. Electronically signed by: Waddell Calk MD 06/29/2024 10:15 AM EDT RP Workstation: [id number]   CT Cervical Spine Wo Contrast Result Date: 06/28/2024 CLINICAL DATA:  Fall EXAM: CT CERVICAL SPINE WITHOUT CONTRAST TECHNIQUE: Multidetector CT imaging of the cervical spine was performed without intravenous contrast. Multiplanar CT image reconstructions were also generated. RADIATION DOSE REDUCTION: This exam was performed according to the departmental dose-optimization program which includes automated exposure control,  adjustment of the mA and/or kV according to patient size and/or use of iterative reconstruction technique. COMPARISON:  None Available. FINDINGS: Alignment: No subluxation Skull base and vertebrae: No acute fracture. No primary bone lesion or focal pathologic process. Soft tissues and spinal canal: No prevertebral fluid or swelling. No visible canal hematoma. Disc levels: Moderate degenerative disc disease with disc space  narrowing and spurring. Advanced degenerative facet disease, left greater than right. Multilevel bilateral neural foraminal narrowing. Upper chest: No acute findings Other: None IMPRESSION: Moderate to advanced degenerative changes as above. No acute bony abnormality. Electronically Signed   By: Franky Crease M.D.   On: 06/28/2024 01:06   CT Head Wo Contrast Result Date: 06/28/2024 CLINICAL DATA:  Fall EXAM: CT HEAD WITHOUT CONTRAST TECHNIQUE: Contiguous axial images were obtained from the base of the skull through the vertex without intravenous contrast. RADIATION DOSE REDUCTION: This exam was performed according to the departmental dose-optimization program which includes automated exposure control, adjustment of the mA and/or kV according to patient size and/or use of iterative reconstruction technique. COMPARISON:  05/25/2024 FINDINGS: Brain: No acute intracranial abnormality. Specifically, no hemorrhage, hydrocephalus, mass lesion, acute infarction, or significant intracranial injury. Vascular: No hyperdense vessel or unexpected calcification. Skull: No acute calvarial abnormality. Sinuses/Orbits: No acute findings Other: None IMPRESSION: No acute intracranial abnormality. Electronically Signed   By: Franky Crease M.D.   On: 06/28/2024 01:04   DG Chest Port 1 View Result Date: 06/28/2024 CLINICAL DATA:  Questionable sepsis.  Fall. EXAM: PORTABLE CHEST 1 VIEW COMPARISON:  11/29/2023 FINDINGS: Heart and mediastinal contours are within normal limits. No confluent airspace opacities or  effusions. No acute bony abnormality. IMPRESSION: No active disease. Electronically Signed   By: Franky Crease M.D.   On: 06/28/2024 00:57   IR NEPHROSTOMY EXCHANGE LEFT Result Date: 06/27/2024 INDICATION: Indwelling left percutaneous nephrostomy tube. 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 EXAM: LEFT PERCUTANEOUS NEPHROSTOMY TUBE EXCHANGE UNDER FLUOROSCOPY INCLUDING NEPHROSTOGRAM COMPARISON:  None Available. MEDICATIONS: None ANESTHESIA/SEDATION: None CONTRAST:  30 mL Omnipaque  300-administered into the collecting system(s) FLUOROSCOPY: Radiation Exposure Index (as provided by the fluoroscopic device): 53 mGy Kerma COMPLICATIONS: None immediate. PROCEDURE: Informed written consent was obtained from the patient after a thorough discussion of the procedural risks, benefits and alternatives. All questions were addressed. Maximal Sterile Barrier Technique was utilized including caps, mask, sterile gowns, sterile gloves, sterile drape, hand hygiene and skin antiseptic. A timeout was performed prior to the initiation of the procedure. Contrast was injected via the pre-existing left nephrostomy tube and fluoroscopic imaging safe. The nephrostomy tube was cut and removed over a guidewire. A 5 [place] Kumpe catheter was advanced into the left ureter and additional contrast injection performed and imaging performed of the entire left ureter into the pelvis. A new 10 French nephrostomy tube was then advanced over the wire and formed at the level of the left renal pelvis. A fluoroscopic image was saved to confirm tube position. The tube was then connected to a new gravity drainage bag and secured at the exit site with a Prolene retention suture and StatLock device. FINDINGS: Nephrostogram demonstrates poor antegrade flow of contrast down the ureter. Additional catheter injection of the left ureter shows ureteral dilatation and evidence of high-grade ureteral obstruction in the  pelvis just above the UVJ. A guidewire would not cross into the bladder lumen. The replaced nephrostomy tube will be left to gravity drainage. IMPRESSION: Left percutaneous nephrostomy tube exchange under fluoroscopy. Detailed nephrostogram performed today including evaluation of the entire ureter demonstrates high-grade obstruction of the distal ureter at the ureterovesical junction. This could not be crossed with a guidewire and the nephrostomy tube was replaced and will be left to gravity bag drainage. Electronically Signed   By: Marcey Moan M.D.   On: 06/27/2024 16:45   NM PET Image Initial (PI) Skull Base To  Thigh Result Date: 06/18/2024 CLINICAL DATA:  Initial treatment strategy for bladder cancer. EXAM: NUCLEAR MEDICINE PET SKULL BASE TO THIGH TECHNIQUE: 9.41 mCi F-18 FDG was injected intravenously. Full-ring PET imaging was performed from the skull base to thigh after the radiotracer. CT data was obtained and used for attenuation correction and anatomic localization. Fasting blood glucose: 170 mg/dl COMPARISON:  CT scans 92/86/7974 FINDINGS: Mediastinal blood pool activity: SUV max 2.7 Liver activity: SUV max NA NECK: Enlarged and hypermetabolic left supraclavicular and subclavicular adenopathy. Supraclavicular node measures 12 mm on image 47/4 and has an SUV max of 5.6. Left subclavicular node measures 12 mm on image 54/4 in has an SUV max of 6.5. Incidental CT findings: Bilateral carotid artery calcifications. CHEST: Small scattered mediastinal and prevascular lymph nodes demonstrating low level FDG uptake. Prevascular node measures 7 mm and SUV max is 2.9. Lymph node between the aorta and esophagus has an SUV max of 4.0. These nodes are unchanged since the chest CT from 07/31/2023 and are likely reactive/inflammatory. No hypermetabolic pulmonary lesions. Numerous small scattered calcified and noncalcified pulmonary nodules are indeterminate finding but appears stable since a prior CT scan from  07/31/2023 suggesting a benign process. Attention on future studies is suggested. Incidental CT findings: Stable atherosclerotic calcifications involving the aorta and coronary arteries. Left atrial closure device noted. ABDOMEN/PELVIS: Extensive retroperitoneal and pelvic adenopathy as demonstrated on the prior CT scan. These nodes are hypermetabolic. Left para-aortic nodal group have an SUV max of 11.9. Right para-aortic nodal group has an SUV max of 10.5. Right common iliac nodes have an SUV max of 10.3 and left common iliac nodes have an SUV max of 7.3. Left-sided external iliac nodal disease has an SUV max of 5.9. Right external iliac nodal disease has an SUV max of 6.3. There is a 11 mm right-sided pelvic nodes adjacent to the rectum which is hypermetabolic with SUV max of 3.8. No inguinal adenopathy. No findings for hepatic or adrenal gland metastasis. No discrete hypermetabolic bladder lesion is identified. Incidental CT findings: The right kidney is surgically absent. The left kidney contains a nephrostomy tube. Aortic and branch vessel atherosclerotic disease appears relatively stable. SKELETON: No findings suspicious for osseous metastatic disease. Incidental CT findings: No lytic or sclerotic bone lesions. Degenerative changes involving the spine. IMPRESSION: 1. Extensive hypermetabolic retroperitoneal and pelvic adenopathy consistent with metastatic disease. 2. Hypermetabolic left supraclavicular and subclavicular adenopathy consistent with metastatic disease. 3. No discrete hypermetabolic bladder lesion is identified. 4. Numerous small scattered calcified and noncalcified pulmonary nodules are indeterminate finding but appears stable since a prior CT scan from 07/31/2023 suggesting a benign process. Attention on future studies is suggested. 5. Borderline enlarged mediastinal and hilar nodes demonstrating low level hypermetabolism. These are stable in size since a CT scan from 07/31/2023 and are likely  benign/reactive. 6. Aortic atherosclerosis. Aortic Atherosclerosis (ICD10-I70.0). Electronically Signed   By: MYRTIS Stammer M.D.   On: 06/18/2024 17:03   IR UNLISTED ABDOMINAL PROCEDURE Result Date: 06/13/2024 Katha Harlene ORN, RT     06/13/2024  9:48 AM Patient presented to IR today with complaints of left nephrostomy tube drainage bag leaking.  Nephrostomy tube dressing was removed and noted to be dry, tube was assessed and noted that suture was no longer intact.  Nephrostomy tube was still draining adequately into drainage bag but bag was noted to be leaking.  The nephrostomy tube was flushed and there was no leaking noted around the tube.  A new drainage bag was attached to  the nephrostomy tube and a new dressing, including a stat-loc securement device was placed to hold tube in place due to suture being broken. Patient was instructed to call IR should any additional concerns arise regarding nephrostomy tube or drainage bag.  Patient was also instructed to return on Wednesday, June 18, 2024 at 3:30pm for previously scheduled routine nephrostomy tube exchange.     Subjective: - no chest pain, shortness of breath, no abdominal pain, nausea or vomiting.   Discharge Exam: BP (!) 153/109 (BP Location: Right Arm)   Pulse 94   Temp (!) 97.5 F (36.4 C)   Resp 20   Ht 5' 11 (1.803 m)   Wt 86.7 kg   SpO2 96%   BMI 26.66 kg/m   General: Pt is alert, awake, not in acute distress Cardiovascular: RRR, S1/S2 +, no rubs, no gallops Respiratory: CTA bilaterally, no wheezing, no rhonchi Abdominal: Soft, NT, ND, bowel sounds + Extremities: no edema, no cyanosis    The results of significant diagnostics from this hospitalization (including imaging, microbiology, ancillary and laboratory) are listed below for reference.     Microbiology: Recent Results (from the past 240 hours)  Culture, blood (Routine X 2) w Reflex to ID Panel     Status: None (Preliminary result)   Collection Time: 07/07/24   6:34 AM   Specimen: BLOOD  Result Value Ref Range Status   Specimen Description BLOOD RIGHT ANTECUBITAL  Final   Special Requests   Final    BOTTLES DRAWN AEROBIC ONLY Blood Culture adequate volume   Culture   Final    NO GROWTH 2 DAYS Performed at [facility], [address] [place], [place] [address]    Report Status PENDING  Incomplete  Culture, blood (Routine X 2) w Reflex to ID Panel     Status: None (Preliminary result)   Collection Time: 07/07/24  6:35 AM   Specimen: BLOOD  Result Value Ref Range Status   Specimen Description BLOOD LEFT ANTECUBITAL  Final   Special Requests   Final    BOTTLES DRAWN AEROBIC ONLY Blood Culture adequate volume   Culture   Final    NO GROWTH 2 DAYS Performed at [facility], [address] [place], [place] [address]    Report Status PENDING  Incomplete     Labs: Basic Metabolic Panel: Recent Labs  Lab 07/04/24 0347 07/06/24 1218 07/06/24 1321 07/07/24 0543 07/09/24 0045 07/10/24 0829  NA 141 141  --  142 140 138  K 4.2 3.8  --  4.2 3.7 3.1*  CL 117* 110  --  114* 112* 108  CO2 19* 20*  --  19* 21* 21*  GLUCOSE 108* 144*  --  145* 213* 205*  BUN 32* 24*  --  25* 27* 20  CREATININE 1.87* 1.85*  --  1.98* 2.24* 1.62*  CALCIUM  8.7* 8.3*  --  8.4* 8.2* 8.0*  MG  --   --  1.9 1.9  --   --    Liver Function Tests: No results for input(s): AST, ALT, ALKPHOS, BILITOT, PROT, ALBUMIN  in the last 168 hours. CBC: Recent Labs  Lab 07/04/24 0347 07/06/24 1218 07/07/24 0543 07/09/24 0045  WBC 10.2 5.1 5.5 5.5  NEUTROABS 7.2 4.4  --  2.0  HGB 10.1* 10.1* 10.7* 9.9*  HCT 32.6* 33.1* 35.2* 32.5*  MCV 74.8* 76.4* 76.9* 78.1*  PLT 241 349 358 278   CBG: Recent Labs  Lab 07/09/24 1934 07/09/24 2356 07/10/24  9660 07/10/24 0748 07/10/24 1202  GLUCAP 187* 201* 224* 315* 154*   Hgb A1c No results for input(s): HGBA1C in the last 72 hours. Lipid Profile No results for input(s): CHOL, HDL, LDLCALC,  TRIG, CHOLHDL, LDLDIRECT in the last 72 hours. Thyroid  function studies No results for input(s): TSH, T4TOTAL, T3FREE, THYROIDAB in the last 72 hours.  Invalid input(s): FREET3 Urinalysis    Component Value Date/Time   COLORURINE YELLOW 06/28/2024 0111   APPEARANCEUR HAZY (A) 06/28/2024 0111   LABSPEC 1.016 06/28/2024 0111   PHURINE 6.0 06/28/2024 0111   GLUCOSEU 50 (A) 06/28/2024 0111   GLUCOSEU NEGATIVE 12/27/2023 1039   HGBUR LARGE (A) 06/28/2024 0111   BILIRUBINUR NEGATIVE 06/28/2024 0111   KETONESUR NEGATIVE 06/28/2024 0111   PROTEINUR 100 (A) 06/28/2024 0111   UROBILINOGEN 4.0 (A) 12/27/2023 1039   NITRITE NEGATIVE 06/28/2024 0111   LEUKOCYTESUR LARGE (A) 06/28/2024 0111    FURTHER DISCHARGE INSTRUCTIONS:   Get Medicines reviewed and adjusted: Please take all your medications with you for your next visit with your Primary MD   Laboratory/radiological data: Please request your Primary MD to go over all hospital tests and procedure/radiological results at the follow up, please ask your Primary MD to get all Hospital records sent to his/her office.   In some cases, they will be blood work, cultures and biopsy results pending at the time of your discharge. Please request that your primary care M.D. goes through all the records of your hospital data and follows up on these results.   Also Note the following: If you experience worsening of your admission symptoms, develop shortness of breath, life threatening emergency, suicidal or homicidal thoughts you must seek medical attention immediately by calling 911 or calling your MD immediately  if symptoms less severe.   You must read complete instructions/literature along with all the possible adverse reactions/side effects for all the Medicines you take and that have been prescribed to you. Take any new Medicines after you have completely understood and accpet all the possible adverse reactions/side effects.    Do  not drive when taking Pain medications or sleeping medications (Benzodaizepines)   Do not take more than prescribed Pain, Sleep and Anxiety Medications. It is not advisable to combine anxiety,sleep and pain medications without talking with your primary care practitioner   Special Instructions: If you have smoked or chewed Tobacco  in the last 2 yrs please stop smoking, stop any regular Alcohol   and or any Recreational drug use.   Wear Seat belts while driving.   Please note: You were cared for by a hospitalist during your hospital stay. Once you are discharged, your primary care physician will handle any further medical issues. Please note that NO REFILLS for any discharge medications will be authorized once you are discharged, as it is imperative that you return to your primary care physician (or establish a relationship with a primary care physician if you do not have one) for your post hospital discharge needs so that they can reassess your need for medications and monitor your lab values.  Time coordinating discharge: 35 minutes  SIGNED:  Nilda Fendt, MD, PhD 07/10/2024, 3:02 PM

## 2024-07-10 NOTE — Progress Notes (Addendum)
 Occupational Therapy Treatment Patient Details Name: Adam Harris MRN: [id number] DOB: [birth year] Today's Date: 07/10/2024   History of present illness Tobiah Celestine is a 78 y.o. male who presented 06/27/24 with a fall and confusion after a recent nephrostomy replacement. Admitted for aepsis secondary to MRSA bacteremia and Enterococcus faecalis infection from UTI. 8/20 TEE. 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   OT comments  Pt making progress with functional goals. Pt in be upon arrival and agreeable to OOB activity. Pt sat Pt stood to RW from EOB min A/CGA to walk to bathroom, mod A with clothing mgt, CGA to transfer to and from commode. Pt stood at sink with CGA for grooming and hygiene tasks, retried to chair with CGA where he participated in UB and LB ADLs min A. Pt pleasant and cooperative. Pt demos decreased activity tolerance and weakness. VSS throughout in room activity. OT will continue to follow acutely to maximize level of function and safety      If plan is discharge home, recommend the following:  A lot of help with walking and/or transfers;Assistance with cooking/housework;Assist for transportation;Help with stairs or ramp for entrance;A little help with walking and/or transfers   Equipment Recommendations  Other (comment) (defer)    Recommendations for Other Services      Precautions / Restrictions Precautions Precautions: Fall Recall of Precautions/Restrictions: Impaired Restrictions Weight Bearing Restrictions Per Provider Order: No       Mobility Bed Mobility Overal bed mobility: Needs Assistance Bed Mobility: Supine to Sit, Sit to Supine     Supine to sit: HOB elevated, Min assist          Transfers Overall transfer level: Needs assistance Equipment used: Rolling walker (2 wheels) Transfers: Sit to/from Stand Sit to Stand:  Contact guard assist           General transfer comment: CGA for safety, walked to bathroom using RW for toilet transfers CGA     Balance Overall balance assessment: Needs assistance, History of Falls Sitting-balance support: Bilateral upper extremity supported, Feet supported Sitting balance-Leahy Scale: Fair     Standing balance support: Bilateral upper extremity supported, During functional activity, Reliant on assistive device for balance Standing balance-Leahy Scale: Poor                             ADL either performed or assessed with clinical judgement   ADL Overall ADL's : Needs assistance/impaired     Grooming: Wash/dry hands;Wash/dry face;Contact guard assist;Standing   Upper Body Bathing: Minimal assistance;Sitting   Lower Body Bathing: Moderate assistance;Sitting/lateral leans   Upper Body Dressing : Minimal assistance;Sitting       Toilet Transfer: Minimal assistance;Contact guard assist;Ambulation;Rolling walker (2 wheels);BSC/3in1;Grab bars;Cueing for safety   Toileting- Clothing Manipulation and Hygiene: Moderate assistance;Sit to/from stand       Functional mobility during ADLs: Minimal assistance;Contact guard assist;Rolling walker (2 wheels);Cueing for safety      Extremity/Trunk Assessment Upper Extremity Assessment Upper Extremity Assessment: Generalized weakness;Right hand dominant;LUE deficits/detail LUE Deficits / Details: chronic shoulder pain and limited ROM from a prior fall   Lower Extremity Assessment Lower Extremity Assessment: Defer to PT evaluation        Vision Baseline Vision/History: 1 Wears glasses Ability to See in Adequate Light: 0 Adequate Patient Visual Report: No change from baseline  Perception     Praxis     Communication Communication Communication: No apparent difficulties   Cognition Arousal: Alert Behavior During Therapy: WFL for tasks assessed/performed                                  Following commands: Impaired Following commands impaired: Follows one step commands inconsistently      Cueing   Cueing Techniques: Verbal cues, Gestural cues  Exercises      Shoulder Instructions       General Comments pt on 2L on arrival, placed on RA for session with SpO2 dropping to 85% during mobility, replaced 2L at end of session with SpO2 returning to 94%    Pertinent Vitals/ Pain       Pain Assessment Pain Assessment: Faces Faces Pain Scale: Hurts a little bit Pain Location: bottom Pain Descriptors / Indicators: Discomfort, Sore, Tender Pain Intervention(s): Repositioned, Monitored during session  Home Living                                          Prior Functioning/Environment              Frequency  Min 2X/week        Progress Toward Goals  OT Goals(current goals can now be found in the care plan section)  Progress towards OT goals: Progressing toward goals     Plan      Co-evaluation                 AM-PAC OT 6 Clicks Daily Activity     Outcome Measure   Help from another person eating meals?: None Help from another person taking care of personal grooming?: A Little Help from another person toileting, which includes using toliet, bedpan, or urinal?: A Lot Help from another person bathing (including washing, rinsing, drying)?: A Lot Help from another person to put on and taking off regular upper body clothing?: A Little Help from another person to put on and taking off regular lower body clothing?: A Lot 6 Click Score: 16    End of Session Equipment Utilized During Treatment: Rolling walker (2 wheels);Gait belt;Oxygen;Other (comment) (3 in 1 over toilet)  OT Visit Diagnosis: Unsteadiness on feet (R26.81);Other abnormalities of gait and mobility (R26.89);Muscle weakness (generalized) (M62.81)   Activity Tolerance Patient tolerated treatment well   Patient Left with call bell/phone within reach;in  chair;with chair alarm set;with nursing/sitter in room   Nurse Communication Mobility status        Time: 8882-8857 OT Time Calculation (min): 25 min  Charges: OT General Charges $OT Visit: 1 Visit OT Treatments $Self Care/Home Management : 8-22 mins $Therapeutic Activity: 8-22 mins    Jacques Karna Loose 07/10/2024, 12:45 PM

## 2024-07-10 NOTE — TOC Progression Note (Addendum)
 Transition of Care [facility]) - Progression Note    Patient Details  Name: Adam Harris MRN: [id number] Date of Birth: [birth year]  Transition of Care [facility]) CM/SW Contact  Bridget Cordella Simmonds, LCSW Phone Number: 07/10/2024, 12:03 PM  Clinical Narrative:   No auth showing as pending in [place].  Auth request resubmitted.  1200: Auth request remains pending in [employer].   1500: SNF auth approved: [id number], 6 days: 8/28-9/2.  CSW confirmed with [facility] that they can receive pt today. MD informed.  [name] informed, discussed transportation and he can take pt to SNF.    Barriers to Discharge: Continued Medical Work up               Expected Discharge Plan and Services   Discharge Planning Services: CM Consult   Living arrangements for the past 2 months: Single Family Home                                       Social Drivers of Health (SDOH) Interventions SDOH Screenings   Food Insecurity: No Food Insecurity (06/28/2024)  Housing: Low Risk  (06/28/2024)  Transportation Needs: No Transportation Needs (06/28/2024)  Utilities: Not At Risk (06/28/2024)  Alcohol  Screen: Low Risk  (08/06/2023)  Depression (PHQ2-9): Low Risk  (06/25/2024)  Financial Resource Strain: Low Risk  (08/06/2023)  Physical Activity: Inactive (08/06/2023)  Social Connections: Moderately Integrated (06/28/2024)  Stress: No Stress Concern Present (08/06/2023)  Tobacco Use: Medium Risk (06/27/2024)  Health Literacy: Adequate Health Literacy (08/06/2023)    Readmission Risk Interventions    07/02/2024   12:36 PM 05/26/2024    3:33 PM  Readmission Risk Prevention Plan  Transportation Screening Complete Complete  PCP or Specialist Appt within 3-5 Days  Complete  HRI or Home Care Consult  Complete  Social Work Consult for Recovery Care Planning/Counseling  Complete  Palliative Care Screening  Not Applicable  Medication Review Oceanographer)  Referral to Pharmacy  PCP or Specialist appointment within 3-5  days of discharge Complete   HRI or Home Care Consult Complete   Palliative Care Screening Complete

## 2024-07-10 NOTE — Plan of Care (Signed)

## 2024-07-10 NOTE — Progress Notes (Signed)
 Physical Therapy Treatment Patient Details Name: Adam Harris MRN: [id number] DOB: 05/18/1946 Today's Date: 07/10/2024   History of Present Illness Taylan Infinger is a 78 y.o. male who presented 06/27/24 with a fall and confusion after a recent nephrostomy replacement. Admitted for aepsis secondary to MRSA bacteremia and Enterococcus faecalis infection from UTI. 8/20 TEE. 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    PT Comments  Pt up in recliner on arrival, pleasant and eager for mobility and demonstrating continued progress towards acute goals. Pt continues to be limited in safe mobility by decreased safe awareness, poor balance/postural reactions and decreased activity tolerance. Pt able to stand from recliner with CGA for safety with pt blocking Les at edge of chair. Pt progressing gait distance with RW for support and min A to maintain balance and manage RW as pt with some L drift catching door frame and fall mat, needing max cues to correct. Pt up in chair at end of session with all needs met. Pt continues to benefit from skilled PT services to progress toward functional mobility goals.      If plan is discharge home, recommend the following: A little help with walking and/or transfers;A little help with bathing/dressing/bathroom;Assistance with cooking/housework;Assist for transportation;Help with stairs or ramp for entrance;Supervision due to cognitive status   Can travel by private vehicle     Yes  Equipment Recommendations  None recommended by PT    Recommendations for Other Services       Precautions / Restrictions Precautions Precautions: Fall Recall of Precautions/Restrictions: Impaired Restrictions Weight Bearing Restrictions Per Provider Order: No     Mobility  Bed Mobility Overal bed mobility: Needs Assistance             General bed  mobility comments: pt up in recliner chair on arrival and at end of session    Transfers Overall transfer level: Needs assistance Equipment used: Rolling walker (2 wheels) Transfers: Sit to/from Stand Sit to Stand: Contact guard assist           General transfer comment: CGA fro safety, good hand placement and steady rise, light blocking of LEs against chair on rise    Ambulation/Gait Ambulation/Gait assistance: Min assist Gait Distance (Feet): 65 Feet Assistive device: Rolling walker (2 wheels) Gait Pattern/deviations: Step-through pattern, Decreased stride length, Drifts right/left Gait velocity: decr     General Gait Details: Pt ambulated into hall with low unsteady steps. Cues for improved sequencing and safety awareness.Min A to maintain balance and facilitate anterior momentum, min A to manage RW as pt bumping obstalces on L   Stairs             Wheelchair Mobility     Tilt Bed    Modified Rankin (Stroke Patients Only)       Balance Overall balance assessment: Needs assistance, History of Falls Sitting-balance support: Bilateral upper extremity supported, Feet supported Sitting balance-Leahy Scale: Fair     Standing balance support: Bilateral upper extremity supported, During functional activity, Reliant on assistive device for balance Standing balance-Leahy Scale: Poor Standing balance comment: Pt dependent on RW                            Communication Communication Communication: No apparent difficulties  Cognition Arousal: Alert Behavior During Therapy: WFL for tasks assessed/performed   PT - Cognitive impairments:  No family/caregiver present to determine baseline                         Following commands: Impaired Following commands impaired: Follows one step commands inconsistently    Cueing Cueing Techniques: Verbal cues, Gestural cues  Exercises      General Comments General comments (skin integrity, edema,  etc.): pt on 2L on arrival, placed on RA for session with SpO2 dropping to 85% during mobility, replaced 2L at end of session with SpO2 returning to 94%      Pertinent Vitals/Pain Pain Assessment Pain Assessment: Faces Faces Pain Scale: Hurts a little bit Pain Location: bottom Pain Descriptors / Indicators: Discomfort, Grimacing, Aching, Sore, Tender Pain Intervention(s): Monitored during session, Limited activity within patient's tolerance, Repositioned    Home Living                          Prior Function            PT Goals (current goals can now be found in the care plan section) Acute Rehab PT Goals Patient Stated Goal: Return Home PT Goal Formulation: With patient Time For Goal Achievement: 07/19/24 Progress towards PT goals: Progressing toward goals    Frequency    Min 2X/week      PT Plan      Co-evaluation              AM-PAC PT 6 Clicks Mobility   Outcome Measure  Help needed turning from your back to your side while in a flat bed without using bedrails?: A Little Help needed moving from lying on your back to sitting on the side of a flat bed without using bedrails?: A Little Help needed moving to and from a bed to a chair (including a wheelchair)?: A Little Help needed standing up from a chair using your arms (e.g., wheelchair or bedside chair)?: A Little Help needed to walk in hospital room?: A Lot Help needed climbing 3-5 steps with a railing? : A Lot 6 Click Score: 16    End of Session Equipment Utilized During Treatment: Gait belt Activity Tolerance: Patient tolerated treatment well;Patient limited by fatigue Patient left: in chair;with call bell/phone within reach;with nursing/sitter in room Nurse Communication: Mobility status PT Visit Diagnosis: History of falling (Z91.81);Repeated falls (R29.6);Unsteadiness on feet (R26.81);Other abnormalities of gait and mobility (R26.89)     Time: 8854-8792 PT Time Calculation (min)  (ACUTE ONLY): 22 min  Charges:    $Gait Training: 8-22 mins PT General Charges $$ ACUTE PT VISIT: 1 Visit                     Yogi Arther R. PTA Acute Rehabilitation Services Office: [phone]   Therisa CHRISTELLA Boor 07/10/2024, 12:29 PM

## 2024-07-10 NOTE — Progress Notes (Deleted)
 PROGRESS NOTE  Adam Harris FMW:[id number] DOB: 01/30/1946 DOA: 06/27/2024 PCP: [name], MD   LOS: 12 days   Brief Narrative / Interim history: Adam Harris is a 78 y.o. 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 He was found to have sepsis from MRSA Bacteremia and enterococcus faecalis infection.   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   Nephrostomy present (HCC)   Permanent atrial fibrillation (HCC)   Chronic diastolic CHF (congestive heart failure), NYHA class 2 (HCC)   Essential hypertension   Type 2 diabetes mellitus with hyperglycemia (HCC)   Chronic kidney disease, stage 3b (HCC)   Microcytic anemia   Hyperlipidemia   Moderate protein-calorie malnutrition (HCC)   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  He underwent right IJ tunneled catheter for this.  Active problems Permament atrial fibrillation - Rate better. Increased the metoprolol  to 50 mg BID with better control of rate. Not on anti coagulation.    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  Continue pain control, with morphine  discontinuation his menatl status is better   Metabolic encephalopathy-patient with increasing confusion in the setting of morphine  use and CKD. Now off morphine , mental status much better.    Hypertension -blood pressure stable.   Microcytic anemia - Hemoglobin stable around 10.    Heart failure with preserved left ventricular ejection fraction - Last echo showed left ventricular ejection fraction of 60 to 65%.  Redose lasix    Hypokalemia - due to lasix , replete   CKD stage IV, metabolic acidosis secondary to sepsis -appears that baseline creatinine has been 2.0-2.4 for a number of months as an outpatient.  Varying here, but overall stable, 1.6 with Lasix     DM2, poorly controlled, with hyperglycemia-continue sliding scale  Lab Results  Component Value Date   HGBA1C 8.3 (H) 05/19/2024   CBG (last 3)  Recent Labs    07/10/24 0339 07/10/24 0748 07/10/24 1202  GLUCAP 224* 315* 154*    Scheduled Meds:  artificial tears  2 drop Both Eyes QID   aspirin  EC  81 mg Oral Daily   calcium  carbonate  1 tablet Oral TID WC   Chlorhexidine  Gluconate Cloth  6 each Topical Daily   [START ON 07/11/2024] enoxaparin  (LOVENOX ) injection  40 mg Subcutaneous Q24H   feeding supplement  237 mL Oral BID BM   Gerhardt's butt cream   Topical BID   hydrocortisone  cream   Topical BID   insulin  aspart  0-9 Units Subcutaneous TID WC   isosorbide  mononitrate  15 mg Oral Daily   linaclotide   145 mcg Oral QAC breakfast  metoprolol  tartrate  50 mg Oral BID   mirtazapine   15 mg Oral QHS   sodium bicarbonate   650 mg Oral BID   sodium chloride  flush  3 mL Intravenous Q12H   tamsulosin   0.4 mg Oral Daily   Continuous Infusions:  DAPTOmycin  900 mg (07/09/24 1731)   PRN Meds:.acetaminophen  **OR** acetaminophen , albuterol , diphenhydrAMINE -zinc  acetate, hydrOXYzine , metoprolol  tartrate, ondansetron   **OR** ondansetron  (ZOFRAN ) IV, oxyCODONE , polyethylene glycol  Current Outpatient Medications  Medication Instructions   aspirin  EC 81 mg, Oral, Daily, Swallow whole.   Carboxymethylcell-Glycerin  PF 0.5-0.9 % SOLN 2 drops, Both Eyes, 4 times daily   daptomycin  (CUBICIN ) IVPB 900 mg, Intravenous, Every 24 hours, Indication:  MRSA/E faecalis bacteremia First Dose: Yes Last Day of Therapy:  07/28/24 Labs - Once weekly:  CBC/D, BMP, and CPK Labs - Once weekly: ESR and CRP Method of administration: IV Push Method of administration may be changed at the discretion of home infusion pharmacist based upon assessment of the patient and/or caregiver's ability to self-administer the medication ordered.   furosemide  (LASIX ) 40 mg, Oral, Daily PRN   hydrOXYzine  (VISTARIL ) 25 mg, Oral, Every 8 hours PRN   isosorbide  mononitrate (IMDUR ) 30 mg, Oral, Daily   lidocaine -prilocaine  (EMLA ) cream Apply to affected area once   linaclotide  (LINZESS ) 145 mcg, Oral, Daily before breakfast   megestrol  ([name]  ES) 625 mg, Oral, Daily   metoprolol  tartrate (LOPRESSOR ) 25 mg, Oral, 2 times daily   metoprolol  tartrate (LOPRESSOR ) 50 mg, Oral, 2 times daily   mirtazapine  (REMERON ) 30 mg, Oral, Daily at bedtime   olmesartan  (BENICAR ) 20 mg, Oral, Daily   ondansetron  (ZOFRAN ) 8 mg, Oral, Every 8 hours PRN   oxyCODONE  (OXY IR/ROXICODONE ) 5 mg, Oral, Every 4 hours PRN   polyethylene glycol (MIRALAX  / GLYCOLAX ) 17 g, Oral, Daily PRN   prochlorperazine  (COMPAZINE ) 10 mg, Oral, Every 6 hours PRN   rosuvastatin  (CRESTOR ) 40 mg, Oral, BH-each morning, Please keep scheduled appointment   sitaGLIPtin  (JANUVIA ) 50 mg, Oral, Daily   sodium bicarbonate  650 mg, 2 times daily   tamsulosin  (FLOMAX ) 0.4 mg, Oral, Daily   traZODone  (DESYREL ) 25-50 mg, Oral, At bedtime PRN    Diet Orders (From admission, onward)     Start     Ordered   07/05/24 1450  Diet regular Room service appropriate? Yes; Fluid consistency: Thin  Diet  effective now       Comments: Add magic cup please  Question Answer Comment  Room service appropriate? Yes   Fluid consistency: Thin      07/05/24 1450            DVT prophylaxis: enoxaparin  (LOVENOX ) injection 40 mg Start: 07/11/24 1000   Lab Results  Component Value Date   PLT 278 07/09/2024      Code Status: Limited: Do not attempt resuscitation (DNR) -DNR-LIMITED -Do Not Intubate/DNI   Family Communication: Daughter present at bedside  Status is: Inpatient Remains inpatient appropriate because: Severity of illness  Level of care: Telemetry Medical  Consultants:  ID Urology  Objective: Vitals:   07/09/24 0819 07/09/24 1937 07/10/24 0403 07/10/24 0745  BP: (!) 138/109 (!) 136/99 (!) 151/108 (!) 153/109  Pulse: 67 94 (!) 105 94  Resp: 17 18 20    Temp: 98.6 F (37 C) 97.9 F (36.6 C) 97.6 F (36.4 C) (!) 97.5 F (36.4 C)  TempSrc: Oral  Oral   SpO2: 99% 98% 97% 96%  Weight:      Height:  Intake/Output Summary (Last 24 hours) at 07/10/2024 1457 Last data filed at 07/10/2024 1300 Gross per 24 hour  Intake 360 ml  Output 1350 ml  Net -990 ml   Wt Readings from Last 3 Encounters:  07/09/24 86.7 kg  06/27/24 89.8 kg  06/26/24 90 kg    Examination:  Constitutional: NAD Eyes: lids and conjunctivae normal, no scleral icterus ENMT: mmm Neck: normal, supple Respiratory: clear to auscultation bilaterally, no wheezing, no crackles.  Cardiovascular: Regular rate and rhythm, no murmurs / rubs / gallops.  Abdomen: soft, no distention, no tenderness. Bowel sounds positive.    Data Reviewed: I have independently reviewed following labs and imaging studies   CBC Recent Labs  Lab 07/04/24 0347 07/06/24 1218 07/07/24 0543 07/09/24 0045  WBC 10.2 5.1 5.5 5.5  HGB 10.1* 10.1* 10.7* 9.9*  HCT 32.6* 33.1* 35.2* 32.5*  PLT 241 349 358 278  MCV 74.8* 76.4* 76.9* 78.1*  MCH 23.2* 23.3* 23.4* 23.8*  MCHC 31.0 30.5 30.4 30.5  RDW 21.1* 22.1* 22.8*  23.2*  LYMPHSABS 1.1 0.5*  --  1.1  MONOABS 0.9 0.2  --  2.2*  EOSABS 0.1 0.0  --  0.0  BASOSABS 0.1 0.1  --  0.1    Recent Labs  Lab 07/04/24 0347 07/06/24 1218 07/06/24 1321 07/07/24 0543 07/09/24 0045 07/10/24 0829  NA 141 141  --  142 140 138  K 4.2 3.8  --  4.2 3.7 3.1*  CL 117* 110  --  114* 112* 108  CO2 19* 20*  --  19* 21* 21*  GLUCOSE 108* 144*  --  145* 213* 205*  BUN 32* 24*  --  25* 27* 20  CREATININE 1.87* 1.85*  --  1.98* 2.24* 1.62*  CALCIUM  8.7* 8.3*  --  8.4* 8.2* 8.0*  MG  --   --  1.9 1.9  --   --     ------------------------------------------------------------------------------------------------------------------ No results for input(s): CHOL, HDL, LDLCALC, TRIG, CHOLHDL, LDLDIRECT in the last 72 hours.  Lab Results  Component Value Date   HGBA1C 8.3 (H) 05/19/2024   ------------------------------------------------------------------------------------------------------------------ No results for input(s): TSH, T4TOTAL, T3FREE, THYROIDAB in the last 72 hours.  Invalid input(s): FREET3  Cardiac Enzymes No results for input(s): CKMB, TROPONINI, MYOGLOBIN in the last 168 hours.  Invalid input(s): CK ------------------------------------------------------------------------------------------------------------------    Component Value Date/Time   BNP 1,069.5 (H) 11/24/2022 0916    CBG: Recent Labs  Lab 07/09/24 1934 07/09/24 2356 07/10/24 0339 07/10/24 0748 07/10/24 1202  GLUCAP 187* 201* 224* 315* 154*    Recent Results (from the past 240 hours)  Culture, blood (Routine X 2) w Reflex to ID Panel     Status: None (Preliminary result)   Collection Time: 07/07/24  6:34 AM   Specimen: BLOOD  Result Value Ref Range Status   Specimen Description BLOOD RIGHT ANTECUBITAL  Final   Special Requests   Final    BOTTLES DRAWN AEROBIC ONLY Blood Culture adequate volume   Culture   Final    NO GROWTH 2 DAYS Performed at  [facility], [address], [place], [place] [address]    Report Status PENDING  Incomplete  Culture, blood (Routine X 2) w Reflex to ID Panel     Status: None (Preliminary result)   Collection Time: 07/07/24  6:35 AM   Specimen: BLOOD  Result Value Ref Range Status   Specimen Description BLOOD LEFT ANTECUBITAL  Final   Special Requests   Final  BOTTLES DRAWN AEROBIC ONLY Blood Culture adequate volume   Culture   Final    NO GROWTH 2 DAYS Performed at [facility], [address], [place], [place] [address]    Report Status PENDING  Incomplete     Radiology Studies: No results found.    Nilda Fendt, MD, PhD [facility]  Between 7 am - 7 pm I am available, please contact me via Amion (for emergencies) or Securechat (non urgent messages)  Between 7 pm - 7 am I am not available, please contact night coverage MD/APP via Amion

## 2024-07-10 NOTE — TOC Transition Note (Signed)
 Transition of Care [facility]) - Discharge Note   Patient Details  Name: Adam Harris MRN: [id number] Date of Birth: 1946/10/13  Transition of Care [facility]) CM/SW Contact:  Bridget Cordella Simmonds, LCSW Phone Number: 07/10/2024, 3:18 PM   Clinical Narrative:   Pt discharging to [facility], room 207.  RN call report to [phone].   [name] will transport, will need pt brought down to main north tower entrance with assistance getting into the vehicle.     Final next level of care: Skilled Nursing Facility Barriers to Discharge: Barriers Resolved   Patient Goals and CMS Choice            Discharge Placement              Patient chooses bed at: [facility] Patient to be transferred to facility by: [name] Name of family member notified: [name] Patient and family notified of of transfer: 07/10/24  Discharge Plan and Services Additional resources added to the After Visit Summary for     Discharge Planning Services: CM Consult                                 Social Drivers of Health (SDOH) Interventions SDOH Screenings   Food Insecurity: No Food Insecurity (06/28/2024)  Housing: Low Risk  (06/28/2024)  Transportation Needs: No Transportation Needs (06/28/2024)  Utilities: Not At Risk (06/28/2024)  Alcohol  Screen: Low Risk  (08/06/2023)  Depression (PHQ2-9): Low Risk  (06/25/2024)  Financial Resource Strain: Low Risk  (08/06/2023)  Physical Activity: Inactive (08/06/2023)  Social Connections: Moderately Integrated (06/28/2024)  Stress: No Stress Concern Present (08/06/2023)  Tobacco Use: Medium Risk (06/27/2024)  Health Literacy: Adequate Health Literacy (08/06/2023)     Readmission Risk Interventions    07/02/2024   12:36 PM 05/26/2024    3:33 PM  Readmission Risk Prevention Plan  Transportation Screening Complete Complete  PCP or Specialist Appt within 3-5 Days  Complete  HRI or Home Care Consult  Complete  Social Work Consult for Recovery Care  Planning/Counseling  Complete  Palliative Care Screening  Not Applicable  Medication Review Oceanographer)  Referral to Pharmacy  PCP or Specialist appointment within 3-5 days of discharge Complete   HRI or Home Care Consult Complete   Palliative Care Screening Complete

## 2024-07-11 ENCOUNTER — Emergency Department (HOSPITAL_COMMUNITY)

## 2024-07-11 ENCOUNTER — Inpatient Hospital Stay (HOSPITAL_COMMUNITY)
Admission: EM | Admit: 2024-07-11 | Discharge: 2024-07-15 | DRG: 291 | Disposition: A | Discharge status: Hospice | Source: Skilled Nursing Facility | Attending: Internal Medicine | Admitting: Internal Medicine

## 2024-07-11 ENCOUNTER — Other Ambulatory Visit: Payer: Self-pay

## 2024-07-11 DIAGNOSIS — E785 Hyperlipidemia, unspecified: Secondary | ICD-10-CM | POA: Diagnosis present

## 2024-07-11 DIAGNOSIS — B952 Enterococcus as the cause of diseases classified elsewhere: Secondary | ICD-10-CM | POA: Diagnosis present

## 2024-07-11 DIAGNOSIS — Z936 Other artificial openings of urinary tract status: Secondary | ICD-10-CM | POA: Diagnosis not present

## 2024-07-11 DIAGNOSIS — E1122 Type 2 diabetes mellitus with diabetic chronic kidney disease: Secondary | ICD-10-CM | POA: Diagnosis present

## 2024-07-11 DIAGNOSIS — I10 Essential (primary) hypertension: Secondary | ICD-10-CM | POA: Diagnosis not present

## 2024-07-11 DIAGNOSIS — N1832 Chronic kidney disease, stage 3b: Secondary | ICD-10-CM | POA: Diagnosis present

## 2024-07-11 DIAGNOSIS — B9562 Methicillin resistant Staphylococcus aureus infection as the cause of diseases classified elsewhere: Secondary | ICD-10-CM | POA: Diagnosis present

## 2024-07-11 DIAGNOSIS — C689 Malignant neoplasm of urinary organ, unspecified: Secondary | ICD-10-CM | POA: Diagnosis present

## 2024-07-11 DIAGNOSIS — R4182 Altered mental status, unspecified: Secondary | ICD-10-CM | POA: Diagnosis not present

## 2024-07-11 DIAGNOSIS — N184 Chronic kidney disease, stage 4 (severe): Secondary | ICD-10-CM | POA: Diagnosis present

## 2024-07-11 DIAGNOSIS — D509 Iron deficiency anemia, unspecified: Secondary | ICD-10-CM | POA: Diagnosis present

## 2024-07-11 DIAGNOSIS — Z8619 Personal history of other infectious and parasitic diseases: Secondary | ICD-10-CM

## 2024-07-11 DIAGNOSIS — Z87898 Personal history of other specified conditions: Secondary | ICD-10-CM

## 2024-07-11 DIAGNOSIS — R0689 Other abnormalities of breathing: Secondary | ICD-10-CM | POA: Diagnosis not present

## 2024-07-11 DIAGNOSIS — Z515 Encounter for palliative care: Secondary | ICD-10-CM

## 2024-07-11 DIAGNOSIS — Z8616 Personal history of COVID-19: Secondary | ICD-10-CM | POA: Diagnosis not present

## 2024-07-11 DIAGNOSIS — R0600 Dyspnea, unspecified: Secondary | ICD-10-CM

## 2024-07-11 DIAGNOSIS — R457 State of emotional shock and stress, unspecified: Secondary | ICD-10-CM | POA: Diagnosis not present

## 2024-07-11 DIAGNOSIS — I4811 Longstanding persistent atrial fibrillation: Secondary | ICD-10-CM

## 2024-07-11 DIAGNOSIS — G4700 Insomnia, unspecified: Secondary | ICD-10-CM | POA: Diagnosis present

## 2024-07-11 DIAGNOSIS — I5032 Chronic diastolic (congestive) heart failure: Secondary | ICD-10-CM | POA: Diagnosis present

## 2024-07-11 DIAGNOSIS — E43 Unspecified severe protein-calorie malnutrition: Secondary | ICD-10-CM | POA: Diagnosis present

## 2024-07-11 DIAGNOSIS — Z6824 Body mass index (BMI) 24.0-24.9, adult: Secondary | ICD-10-CM

## 2024-07-11 DIAGNOSIS — Z800 Family history of malignant neoplasm of digestive organs: Secondary | ICD-10-CM

## 2024-07-11 DIAGNOSIS — E1165 Type 2 diabetes mellitus with hyperglycemia: Secondary | ICD-10-CM | POA: Diagnosis present

## 2024-07-11 DIAGNOSIS — Z66 Do not resuscitate: Secondary | ICD-10-CM | POA: Diagnosis present

## 2024-07-11 DIAGNOSIS — E038 Other specified hypothyroidism: Secondary | ICD-10-CM | POA: Diagnosis present

## 2024-07-11 DIAGNOSIS — L89152 Pressure ulcer of sacral region, stage 2: Secondary | ICD-10-CM | POA: Diagnosis present

## 2024-07-11 DIAGNOSIS — Z8546 Personal history of malignant neoplasm of prostate: Secondary | ICD-10-CM

## 2024-07-11 DIAGNOSIS — I2510 Atherosclerotic heart disease of native coronary artery without angina pectoris: Secondary | ICD-10-CM | POA: Diagnosis present

## 2024-07-11 DIAGNOSIS — G928 Other toxic encephalopathy: Secondary | ICD-10-CM | POA: Diagnosis present

## 2024-07-11 DIAGNOSIS — Z79899 Other long term (current) drug therapy: Secondary | ICD-10-CM

## 2024-07-11 DIAGNOSIS — I130 Hypertensive heart and chronic kidney disease with heart failure and stage 1 through stage 4 chronic kidney disease, or unspecified chronic kidney disease: Principal | ICD-10-CM | POA: Diagnosis present

## 2024-07-11 DIAGNOSIS — R000 Tachycardia, unspecified: Secondary | ICD-10-CM | POA: Diagnosis present

## 2024-07-11 DIAGNOSIS — Z888 Allergy status to other drugs, medicaments and biological substances status: Secondary | ICD-10-CM

## 2024-07-11 DIAGNOSIS — Z8249 Family history of ischemic heart disease and other diseases of the circulatory system: Secondary | ICD-10-CM

## 2024-07-11 DIAGNOSIS — D72829 Elevated white blood cell count, unspecified: Secondary | ICD-10-CM | POA: Diagnosis present

## 2024-07-11 DIAGNOSIS — R450 Nervousness: Secondary | ICD-10-CM | POA: Diagnosis not present

## 2024-07-11 DIAGNOSIS — Z95818 Presence of other cardiac implants and grafts: Secondary | ICD-10-CM

## 2024-07-11 DIAGNOSIS — I509 Heart failure, unspecified: Secondary | ICD-10-CM | POA: Diagnosis not present

## 2024-07-11 DIAGNOSIS — I5033 Acute on chronic diastolic (congestive) heart failure: Secondary | ICD-10-CM | POA: Diagnosis present

## 2024-07-11 DIAGNOSIS — I4891 Unspecified atrial fibrillation: Secondary | ICD-10-CM | POA: Diagnosis not present

## 2024-07-11 DIAGNOSIS — Z87440 Personal history of urinary (tract) infections: Secondary | ICD-10-CM

## 2024-07-11 DIAGNOSIS — R627 Adult failure to thrive: Secondary | ICD-10-CM | POA: Diagnosis present

## 2024-07-11 DIAGNOSIS — I252 Old myocardial infarction: Secondary | ICD-10-CM

## 2024-07-11 DIAGNOSIS — I517 Cardiomegaly: Secondary | ICD-10-CM | POA: Diagnosis not present

## 2024-07-11 DIAGNOSIS — Z87891 Personal history of nicotine dependence: Secondary | ICD-10-CM

## 2024-07-11 DIAGNOSIS — Z452 Encounter for adjustment and management of vascular access device: Secondary | ICD-10-CM | POA: Diagnosis not present

## 2024-07-11 DIAGNOSIS — N135 Crossing vessel and stricture of ureter without hydronephrosis: Secondary | ICD-10-CM | POA: Diagnosis present

## 2024-07-11 DIAGNOSIS — I4821 Permanent atrial fibrillation: Secondary | ICD-10-CM | POA: Diagnosis present

## 2024-07-11 DIAGNOSIS — I110 Hypertensive heart disease with heart failure: Secondary | ICD-10-CM | POA: Diagnosis not present

## 2024-07-11 DIAGNOSIS — R918 Other nonspecific abnormal finding of lung field: Secondary | ICD-10-CM | POA: Diagnosis not present

## 2024-07-11 DIAGNOSIS — I272 Pulmonary hypertension, unspecified: Secondary | ICD-10-CM | POA: Diagnosis present

## 2024-07-11 DIAGNOSIS — Z7901 Long term (current) use of anticoagulants: Secondary | ICD-10-CM

## 2024-07-11 DIAGNOSIS — G9340 Encephalopathy, unspecified: Secondary | ICD-10-CM | POA: Diagnosis not present

## 2024-07-11 DIAGNOSIS — N136 Pyonephrosis: Secondary | ICD-10-CM | POA: Diagnosis present

## 2024-07-11 DIAGNOSIS — T402X5A Adverse effect of other opioids, initial encounter: Secondary | ICD-10-CM | POA: Diagnosis present

## 2024-07-11 DIAGNOSIS — R0602 Shortness of breath: Secondary | ICD-10-CM | POA: Diagnosis not present

## 2024-07-11 DIAGNOSIS — Z7982 Long term (current) use of aspirin: Secondary | ICD-10-CM

## 2024-07-11 LAB — COMPREHENSIVE METABOLIC PANEL WITH GFR
ALT: 26 U/L (ref 0–44)
AST: 33 U/L (ref 15–41)
Albumin: 2.1 g/dL — ABNORMAL LOW (ref 3.5–5.0)
Alkaline Phosphatase: 118 U/L (ref 38–126)
Anion gap: 14 (ref 5–15)
BUN: 21 mg/dL (ref 8–23)
CO2: 20 mmol/L — ABNORMAL LOW (ref 22–32)
Calcium: 8.4 mg/dL — ABNORMAL LOW (ref 8.9–10.3)
Chloride: 104 mmol/L (ref 98–111)
Creatinine, Ser: 2.05 mg/dL — ABNORMAL HIGH (ref 0.61–1.24)
GFR, Estimated: 33 mL/min — ABNORMAL LOW (ref >60)
Glucose, Bld: 182 mg/dL — ABNORMAL HIGH (ref 70–99)
Potassium: 4.2 mmol/L (ref 3.5–5.1)
Sodium: 138 mmol/L (ref 135–145)
Total Bilirubin: 3.4 mg/dL — ABNORMAL HIGH (ref 0.0–1.2)
Total Protein: 7.4 g/dL (ref 6.5–8.1)

## 2024-07-11 LAB — CBC WITH DIFFERENTIAL/PLATELET
Basophils Absolute: 0.1 K/uL (ref 0.0–0.1)
Basophils Relative: 1 %
Eosinophils Absolute: 0 K/uL (ref 0.0–0.5)
Eosinophils Relative: 0 %
HCT: 37.8 % — ABNORMAL LOW (ref 39.0–52.0)
Hemoglobin: 11.6 g/dL — ABNORMAL LOW (ref 13.0–17.0)
Lymphocytes Relative: 6 %
Lymphs Abs: 0.8 K/uL (ref 0.7–4.0)
MCH: 23.7 pg — ABNORMAL LOW (ref 26.0–34.0)
MCHC: 30.7 g/dL (ref 30.0–36.0)
MCV: 77.3 fL — ABNORMAL LOW (ref 80.0–100.0)
Monocytes Absolute: 2.3 K/uL — ABNORMAL HIGH (ref 0.1–1.0)
Monocytes Relative: 18 %
Neutro Abs: 9.5 K/uL — ABNORMAL HIGH (ref 1.7–7.7)
Neutrophils Relative %: 75 %
Platelets: 309 K/uL (ref 150–400)
RBC: 4.89 MIL/uL (ref 4.22–5.81)
RDW: 24.6 % — ABNORMAL HIGH (ref 11.5–15.5)
WBC: 12.7 K/uL — ABNORMAL HIGH (ref 4.0–10.5)
nRBC: 0.6 % — ABNORMAL HIGH (ref 0.0–0.2)

## 2024-07-11 LAB — T4, FREE: Free T4: 0.79 ng/dL (ref 0.61–1.12)

## 2024-07-11 LAB — BLOOD GAS, VENOUS
Acid-base deficit: 0.7 mmol/L (ref 0.0–2.0)
Bicarbonate: 23.4 mmol/L (ref 20.0–28.0)
Drawn by: 3409
O2 Saturation: 26.8 %
Patient temperature: 37
pCO2, Ven: 36 mmHg — ABNORMAL LOW (ref 44–60)
pH, Ven: 7.42 (ref 7.25–7.43)
pO2, Ven: <31 mmHg — CL (ref 32–45)

## 2024-07-11 LAB — URINALYSIS, ROUTINE W REFLEX MICROSCOPIC
Bacteria, UA: NONE SEEN
Bilirubin Urine: NEGATIVE
Glucose, UA: NEGATIVE mg/dL
Ketones, ur: NEGATIVE mg/dL
Nitrite: NEGATIVE
Protein, ur: 100 mg/dL — AB
Specific Gravity, Urine: 1.008 (ref 1.005–1.030)
pH: 7 (ref 5.0–8.0)

## 2024-07-11 LAB — RAPID URINE DRUG SCREEN, HOSP PERFORMED
Amphetamines: NOT DETECTED
Barbiturates: NOT DETECTED
Benzodiazepines: NOT DETECTED
Cocaine: NOT DETECTED
Opiates: NOT DETECTED
Tetrahydrocannabinol: NOT DETECTED

## 2024-07-11 LAB — BRAIN NATRIURETIC PEPTIDE: B Natriuretic Peptide: 1285 pg/mL — ABNORMAL HIGH (ref 0.0–100.0)

## 2024-07-11 LAB — AMMONIA: Ammonia: 29 umol/L (ref 9–35)

## 2024-07-11 LAB — TSH: TSH: 5.579 u[IU]/mL — ABNORMAL HIGH (ref 0.350–4.500)

## 2024-07-11 LAB — GLUCOSE, CAPILLARY: Glucose-Capillary: 192 mg/dL — ABNORMAL HIGH (ref 70–99)

## 2024-07-11 LAB — CBG MONITORING, ED: Glucose-Capillary: 178 mg/dL — ABNORMAL HIGH (ref 70–99)

## 2024-07-11 LAB — ETHANOL: Alcohol, Ethyl (B): <15 mg/dL (ref <15)

## 2024-07-11 LAB — PROCALCITONIN: Procalcitonin: 1.06 ng/mL

## 2024-07-11 LAB — MRSA NEXT GEN BY PCR, NASAL: MRSA by PCR Next Gen: DETECTED — AB

## 2024-07-11 LAB — TROPONIN I (HIGH SENSITIVITY): Troponin I (High Sensitivity): 31 ng/L — ABNORMAL HIGH (ref <18)

## 2024-07-11 MED ORDER — METOPROLOL TARTRATE 5 MG/5ML IV SOLN
2.5000 mg | Freq: Once | INTRAVENOUS | Status: AC
Start: 2024-07-11 — End: 2024-07-11
  Administered 2024-07-11: 2.5 mg via INTRAVENOUS
  Filled 2024-07-11: qty 5

## 2024-07-11 MED ORDER — MEGESTROL ACETATE 400 MG/10ML PO SUSP
625.0000 mg | Freq: Every day | ORAL | Status: DC
  Administered 2024-07-12 – 2024-07-15 (×4): 625 mg via ORAL
  Filled 2024-07-11 (×4): qty 20

## 2024-07-11 MED ORDER — ALBUTEROL SULFATE (2.5 MG/3ML) 0.083% IN NEBU: 2.5000 mg | INHALATION_SOLUTION | Freq: Four times a day (QID) | RESPIRATORY_TRACT | Status: DC | PRN

## 2024-07-11 MED ORDER — POLYETHYLENE GLYCOL 3350 17 G PO PACK: 17.0000 g | PACK | Freq: Every day | ORAL | Status: DC | PRN

## 2024-07-11 MED ORDER — METOPROLOL TARTRATE 5 MG/5ML IV SOLN
5.0000 mg | Freq: Once | INTRAVENOUS | Status: AC
  Administered 2024-07-11: 5 mg via INTRAVENOUS
  Filled 2024-07-11: qty 5

## 2024-07-11 MED ORDER — SODIUM CHLORIDE 0.9% FLUSH
3.0000 mL | Freq: Two times a day (BID) | INTRAVENOUS | Status: DC
  Administered 2024-07-11 – 2024-07-12 (×3): 3 mL via INTRAVENOUS

## 2024-07-11 MED ORDER — LINACLOTIDE 145 MCG PO CAPS
145.0000 ug | ORAL_CAPSULE | Freq: Every day | ORAL | Status: DC
  Administered 2024-07-12 – 2024-07-15 (×4): 145 ug via ORAL
  Filled 2024-07-11 (×4): qty 1

## 2024-07-11 MED ORDER — HYDROXYZINE HCL 10 MG PO TABS: 25.0000 mg | ORAL_TABLET | Freq: Three times a day (TID) | ORAL | Status: DC | PRN

## 2024-07-11 MED ORDER — INSULIN ASPART 100 UNIT/ML IJ SOLN
0.0000 [IU] | Freq: Three times a day (TID) | INTRAMUSCULAR | Status: DC
  Administered 2024-07-12: 2 [IU] via SUBCUTANEOUS
  Administered 2024-07-12 (×2): 1 [IU] via SUBCUTANEOUS
  Administered 2024-07-13: 2 [IU] via SUBCUTANEOUS
  Administered 2024-07-13: 1 [IU] via SUBCUTANEOUS
  Administered 2024-07-13 – 2024-07-14 (×3): 2 [IU] via SUBCUTANEOUS
  Administered 2024-07-14: 1 [IU] via SUBCUTANEOUS
  Administered 2024-07-15 (×2): 2 [IU] via SUBCUTANEOUS

## 2024-07-11 MED ORDER — TRAZODONE HCL 50 MG PO TABS
25.0000 mg | ORAL_TABLET | Freq: Every evening | ORAL | Status: DC | PRN
  Administered 2024-07-13: 25 mg via ORAL
  Administered 2024-07-13: 50 mg via ORAL
  Filled 2024-07-11 (×2): qty 1

## 2024-07-11 MED ORDER — ACETAMINOPHEN 650 MG RE SUPP: 650.0000 mg | Freq: Four times a day (QID) | RECTAL | Status: DC | PRN

## 2024-07-11 MED ORDER — ROSUVASTATIN CALCIUM 20 MG PO TABS
40.0000 mg | ORAL_TABLET | ORAL | Status: DC
  Administered 2024-07-12 – 2024-07-15 (×4): 40 mg via ORAL
  Filled 2024-07-11 (×4): qty 2

## 2024-07-11 MED ORDER — MIRTAZAPINE 15 MG PO TABS
30.0000 mg | ORAL_TABLET | Freq: Every day | ORAL | Status: DC
Start: 2024-07-11 — End: 2024-07-15
  Administered 2024-07-11 – 2024-07-14 (×4): 30 mg via ORAL
  Filled 2024-07-11 (×5): qty 2

## 2024-07-11 MED ORDER — METOPROLOL TARTRATE 25 MG PO TABS
50.0000 mg | ORAL_TABLET | Freq: Once | ORAL | Status: AC
  Administered 2024-07-11: 50 mg via ORAL
  Filled 2024-07-11: qty 2

## 2024-07-11 MED ORDER — ENOXAPARIN SODIUM 40 MG/0.4ML IJ SOSY
40.0000 mg | PREFILLED_SYRINGE | INTRAMUSCULAR | Status: DC
  Administered 2024-07-11 – 2024-07-14 (×4): 40 mg via SUBCUTANEOUS
  Filled 2024-07-11 (×4): qty 0.4

## 2024-07-11 MED ORDER — POLYVINYL ALCOHOL 1.4 % OP SOLN: 1.0000 [drp] | OPHTHALMIC | Status: DC | PRN

## 2024-07-11 MED ORDER — TAMSULOSIN HCL 0.4 MG PO CAPS
0.4000 mg | ORAL_CAPSULE | Freq: Every day | ORAL | Status: DC
  Administered 2024-07-11 – 2024-07-15 (×5): 0.4 mg via ORAL
  Filled 2024-07-11 (×5): qty 1

## 2024-07-11 MED ORDER — FUROSEMIDE 10 MG/ML IJ SOLN: 40.0000 mg | Freq: Every day | INTRAMUSCULAR | Status: DC

## 2024-07-11 MED ORDER — INSULIN ASPART 100 UNIT/ML IJ SOLN
0.0000 [IU] | Freq: Every day | INTRAMUSCULAR | Status: DC
  Administered 2024-07-13: 2 [IU] via SUBCUTANEOUS

## 2024-07-11 MED ORDER — OXYCODONE HCL 5 MG PO TABS
5.0000 mg | ORAL_TABLET | Freq: Four times a day (QID) | ORAL | Status: DC | PRN
  Administered 2024-07-13 (×2): 5 mg via ORAL
  Filled 2024-07-11 (×2): qty 1

## 2024-07-11 MED ORDER — DAPTOMYCIN IV (FOR PTA / DISCHARGE USE ONLY): 900.0000 mg | INTRAVENOUS | Status: DC

## 2024-07-11 MED ORDER — METOPROLOL TARTRATE 25 MG PO TABS
50.0000 mg | ORAL_TABLET | Freq: Two times a day (BID) | ORAL | Status: DC
  Administered 2024-07-12 – 2024-07-15 (×7): 50 mg via ORAL
  Filled 2024-07-11 (×7): qty 2

## 2024-07-11 MED ORDER — ASPIRIN 81 MG PO TBEC
81.0000 mg | DELAYED_RELEASE_TABLET | Freq: Every day | ORAL | Status: DC
  Administered 2024-07-12 – 2024-07-15 (×4): 81 mg via ORAL
  Filled 2024-07-11 (×4): qty 1

## 2024-07-11 MED ORDER — SODIUM BICARBONATE 650 MG PO TABS
650.0000 mg | ORAL_TABLET | Freq: Two times a day (BID) | ORAL | Status: DC
  Administered 2024-07-11 – 2024-07-14 (×6): 650 mg via ORAL
  Filled 2024-07-11 (×6): qty 1

## 2024-07-11 MED ORDER — CARBOXYMETHYLCELL-GLYCERIN PF 0.5-0.9 % OP SOLN: 2.0000 [drp] | Freq: Four times a day (QID) | OPHTHALMIC | Status: DC

## 2024-07-11 MED ORDER — ACETAMINOPHEN 325 MG PO TABS: 650.0000 mg | ORAL_TABLET | Freq: Four times a day (QID) | ORAL | Status: DC | PRN

## 2024-07-11 MED ORDER — OXYCODONE HCL 5 MG PO TABS: 5.0000 mg | ORAL_TABLET | ORAL | Status: DC | PRN

## 2024-07-11 MED ORDER — SODIUM CHLORIDE 0.9 % IV SOLN
900.0000 mg | Freq: Every day | INTRAVENOUS | Status: DC
  Administered 2024-07-11 – 2024-07-13 (×3): 900 mg via INTRAVENOUS
  Filled 2024-07-11 (×3): qty 18

## 2024-07-11 MED ORDER — FUROSEMIDE 10 MG/ML IJ SOLN
40.0000 mg | Freq: Once | INTRAMUSCULAR | Status: AC
  Administered 2024-07-11: 40 mg via INTRAVENOUS
  Filled 2024-07-11: qty 4

## 2024-07-11 MED ORDER — ISOSORBIDE MONONITRATE ER 30 MG PO TB24
30.0000 mg | ORAL_TABLET | Freq: Every day | ORAL | Status: DC
Start: 2024-07-12 — End: 2024-07-12

## 2024-07-11 NOTE — ED Triage Notes (Signed)
 Pt BIB [facility] from [facility], was called due to tachycardia and hypertension. As per EMS he was having periods of apnea. Received 0.5 Narcan, 250 ML bolus of NS. 4mg  zofran . Pt was responsive to narcan, RR are now even. Pt prescribed Oxycodone  however nursing home staff told them he hasn't been administered it. Pt arrived to nursing home yesterday. Pt verbalizes being anxious.   20RAC.

## 2024-07-11 NOTE — ED Provider Notes (Signed)
 [facility] EM[facility] AT [facility] Provider Note   CSN: [id number] Arrival date & time: 07/11/24  1215     Patient presents with: Tachycardia   [facility] is a 78 y.o. male.   78 year old male with a history of hypertension, hyperlipidemia, CHF, atrial fibrillation, CAD diabetes, and metastatic urothelial cancer status post nephrostomy as well as CKD stage IV who presents to the emergency department with tachycardia and hypertension.  History obtained per EMS and the patient.  Called [employer] multiple times but unable to reach anyone who can provide additional history.  Patient was hospitalized and discharged yesterday after being found to be septic with MRSA bacteremia and E faecalis in the setting of having a urologic procedure done.  Had a tunneled central line placed and was started on daptomycin .  Was discharged back to [facility] yesterday and they called 911 because of tachycardia and hypertension.  EMS reports that he was anxious and less responsive and occasionally apneic.  Was given morphine  during hospitalization and has oxycodone  prescribed to him and so EMS tried Narcan with some improvement of his mentation.       Prior to Admission medications   Medication Sig Start Date End Date Taking? Authorizing Provider  aspirin  EC 81 MG tablet Take 1 tablet (81 mg total) by mouth daily. Swallow whole. 03/13/24  Yes [name], MD  Carboxymethylcell-Glycerin  PF 0.5-0.9 % SOLN Place 2 drops into both eyes 4 (four) times daily. 06/04/24  Yes [name], MD  daptomycin  (CUBICIN ) IVPB Inject 900 mg into the vein daily for 25 days. Indication:  MRSA/E faecalis bacteremia First Dose: Yes Last Day of Therapy:  07/28/24 Labs - Once weekly:  CBC/D, BMP, and CPK Labs - Once weekly: ESR and CRP Method of administration: IV Push Method of administration may be changed at the discretion of home infusion pharmacist based upon assessment of the patient and/or caregiver's  ability to self-administer the medication ordered. 07/10/24 08/04/24 Yes Gherghe, Costin M, MD  furosemide  (LASIX ) 40 MG tablet Take 1 tablet (40 mg total) by mouth daily as needed for fluid or edema. 07/10/24  Yes Gherghe, Costin M, MD  hydrOXYzine  (VISTARIL ) 25 MG capsule Take 1 capsule (25 mg total) by mouth every 8 (eight) hours as needed. Patient taking differently: Take 25 mg by mouth every 8 (eight) hours as needed for anxiety. 05/11/23  Yes [name], MD  isosorbide  mononitrate (IMDUR ) 30 MG 24 hr tablet Take 1 tablet (30 mg total) by mouth daily. 06/06/24  Yes Cherlyn Labella, MD  linaclotide  (LINZESS ) 145 MCG CAPS capsule Take 1 capsule (145 mcg total) by mouth daily before breakfast. 06/19/24  Yes [name], MD  megestrol  ([name]  ES) 625 MG/5ML suspension Take 5 mLs (625 mg total) by mouth daily. 06/12/24  Yes [name], MD  metoprolol  tartrate (LOPRESSOR ) 50 MG tablet Take 1 tablet (50 mg total) by mouth 2 (two) times daily. 07/10/24  Yes Gherghe, Costin M, MD  mirtazapine  (REMERON ) 30 MG tablet Take 1 tablet (30 mg total) by mouth at bedtime. 06/12/24  Yes [name], MD  ondansetron  (ZOFRAN ) 8 MG tablet Take 1 tablet (8 mg total) by mouth every 8 (eight) hours as needed for nausea or vomiting. 06/04/24  Yes [name], MD  oxyCODONE  (OXY IR/ROXICODONE ) 5 MG immediate release tablet Take 1 tablet (5 mg total) by mouth every 4 (four) hours as needed for severe pain (pain score 7-10). 07/10/24  Yes Trixie Nilda HERO, MD  polyethylene glycol (MIRALAX  / GLYCOLAX ) 17 g packet Take 17 g by mouth daily as needed for mild constipation. 06/03/24  Yes Akula, Vijaya, MD  prochlorperazine  (COMPAZINE ) 10 MG tablet Take 1 tablet (10 mg total) by mouth every 6 (six) hours as needed for nausea or vomiting. 06/04/24  Yes [name], MD  rosuvastatin  (CRESTOR ) 40 MG tablet TAKE 1 TABLET (40 MG TOTAL) BY MOUTH EVERY MORNING. PLEASE KEEP SCHEDULED APPOINTMENT 08/24/23  Yes [name], MD  sitaGLIPtin  (JANUVIA )  50 MG tablet Take 1 tablet (50 mg total) by mouth daily. 05/19/24  Yes [name], MD  sodium bicarbonate  325 MG tablet Take 650 mg by mouth 2 (two) times daily.   Yes [provider]  tamsulosin  (FLOMAX ) 0.4 MG CAPS capsule TAKE ONE CAPSULE BY MOUTH DAILY 02/22/24  Yes [name], MD  traZODone  (DESYREL ) 50 MG tablet Take 0.5-1 tablets (25-50 mg total) by mouth at bedtime as needed for sleep. 10/16/23  Yes [name], MD    Allergies: Xarelto  [rivaroxaban ]    Review of Systems  Updated Vital Signs BP 96/63 (BP Location: Left Arm)   Pulse 77   Temp 98.1 F (36.7 C) (Oral)   Resp 16   Ht 5' 11 (1.803 m)   Wt 80.3 kg   SpO2 94%   BMI 24.69 kg/m   Physical Exam Vitals and nursing note reviewed.  Constitutional:      General: He is not in acute distress.    Appearance: He is well-developed. He is ill-appearing.     Comments: Patient appears anxious and restless.  Will respond to commands intermittently.  Was alert and oriented to person, the fact that he was in the hospital, and the year being 2025.  HENT:     Head: Normocephalic and atraumatic.     Right Ear: External ear normal.     Left Ear: External ear normal.     Nose: Nose normal.  Eyes:     Extraocular Movements: Extraocular movements intact.     Conjunctiva/sclera: Conjunctivae normal.     Pupils: Pupils are equal, round, and reactive to light.     Comments: Pupils 5 mm bilaterally  Cardiovascular:     Rate and Rhythm: Tachycardia present. Rhythm irregular.     Heart sounds: Normal heart sounds.     Comments: Tunneled catheter in right chest wall with a site that appears clean dry and intact Pulmonary:     Effort: Pulmonary effort is normal. No respiratory distress.     Breath sounds: Normal breath sounds.  Abdominal:     Comments: Left-sided percutaneous nephrostomy tube  Genitourinary:    Comments: Very small stage II sacral decubitus ulcer. Musculoskeletal:     Cervical back: Normal range  of motion and neck supple.     Right lower leg: No edema.     Left lower leg: No edema.  Skin:    General: Skin is warm and dry.  Neurological:     Comments: Unclear baseline.  Patient is moving all 4 extremities and writhing around in the stretcher.  Does appear to be somewhat agitated  Psychiatric:        Mood and Affect: Mood normal.        Behavior: Behavior normal.     (all labs ordered are listed, but only abnormal results are displayed) Labs Reviewed  MRSA NEXT GEN BY PCR, NASAL - Abnormal; Notable for the following components:      Result Value  MRSA by PCR Next Gen DETECTED (*)    All other components within normal limits  COMPREHENSIVE METABOLIC PANEL WITH GFR - Abnormal; Notable for the following components:   CO2 20 (*)    Glucose, Bld 182 (*)    Creatinine, Ser 2.05 (*)    Calcium  8.4 (*)    Albumin  2.1 (*)    Total Bilirubin 3.4 (*)    GFR, Estimated 33 (*)    All other components within normal limits  BLOOD GAS, VENOUS - Abnormal; Notable for the following components:   pCO2, Ven 36 (*)    pO2, Ven <31 (*)    All other components within normal limits  CBC WITH DIFFERENTIAL/PLATELET - Abnormal; Notable for the following components:   WBC 12.7 (*)    Hemoglobin 11.6 (*)    HCT 37.8 (*)    MCV 77.3 (*)    MCH 23.7 (*)    RDW 24.6 (*)    nRBC 0.6 (*)    Neutro Abs 9.5 (*)    Monocytes Absolute 2.3 (*)    All other components within normal limits  URINALYSIS, ROUTINE W REFLEX MICROSCOPIC - Abnormal; Notable for the following components:   Hgb urine dipstick SMALL (*)    Protein, ur 100 (*)    Leukocytes,Ua TRACE (*)    All other components within normal limits  TSH - Abnormal; Notable for the following components:   TSH 5.579 (*)    All other components within normal limits  BRAIN NATRIURETIC PEPTIDE - Abnormal; Notable for the following components:   B Natriuretic Peptide 1,285.0 (*)    All other components within normal limits  CBC - Abnormal;  Notable for the following components:   WBC 15.2 (*)    Hemoglobin 10.7 (*)    HCT 34.6 (*)    MCV 76.2 (*)    MCH 23.6 (*)    RDW 24.4 (*)    nRBC 0.3 (*)    All other components within normal limits  BASIC METABOLIC PANEL WITH GFR - Abnormal; Notable for the following components:   Glucose, Bld 118 (*)    Creatinine, Ser 1.93 (*)    Calcium  7.9 (*)    GFR, Estimated 35 (*)    All other components within normal limits  MAGNESIUM  - Abnormal; Notable for the following components:   Magnesium  1.6 (*)    All other components within normal limits  GLUCOSE, CAPILLARY - Abnormal; Notable for the following components:   Glucose-Capillary 192 (*)    All other components within normal limits  GLUCOSE, CAPILLARY - Abnormal; Notable for the following components:   Glucose-Capillary 129 (*)    All other components within normal limits  GLUCOSE, CAPILLARY - Abnormal; Notable for the following components:   Glucose-Capillary 133 (*)    All other components within normal limits  GLUCOSE, CAPILLARY - Abnormal; Notable for the following components:   Glucose-Capillary 154 (*)    All other components within normal limits  CBC - Abnormal; Notable for the following components:   WBC 12.9 (*)    Hemoglobin 10.2 (*)    HCT 33.1 (*)    MCV 76.3 (*)    MCH 23.5 (*)    RDW 24.7 (*)    All other components within normal limits  BASIC METABOLIC PANEL WITH GFR - Abnormal; Notable for the following components:   Glucose, Bld 138 (*)    Creatinine, Ser 2.13 (*)    Calcium  7.7 (*)    GFR, Estimated  31 (*)    All other components within normal limits  GLUCOSE, CAPILLARY - Abnormal; Notable for the following components:   Glucose-Capillary 146 (*)    All other components within normal limits  GLUCOSE, CAPILLARY - Abnormal; Notable for the following components:   Glucose-Capillary 138 (*)    All other components within normal limits  GLUCOSE, CAPILLARY - Abnormal; Notable for the following  components:   Glucose-Capillary 161 (*)    All other components within normal limits  GLUCOSE, CAPILLARY - Abnormal; Notable for the following components:   Glucose-Capillary 176 (*)    All other components within normal limits  CBG MONITORING, ED - Abnormal; Notable for the following components:   Glucose-Capillary 178 (*)    All other components within normal limits  TROPONIN I (HIGH SENSITIVITY) - Abnormal; Notable for the following components:   Troponin I (High Sensitivity) 31 (*)    All other components within normal limits  CULTURE, BLOOD (ROUTINE X 2)  CULTURE, BLOOD (ROUTINE X 2)  AMMONIA  RAPID URINE DRUG SCREEN, HOSP PERFORMED  ETHANOL  T4, FREE  PROCALCITONIN  PATHOLOGIST SMEAR REVIEW    EKG: EKG Interpretation Date/Time:  Friday July 11 2024 13:27:05 EDT Ventricular Rate:  139 PR Interval:    QRS Duration:  91 QT Interval:  332 QTC Calculation: 505 R Axis:   44  Text Interpretation: Atrial fibrillation RSR' in V1 or V2, right VCD or RVH Repolarization abnormality, prob rate related Prolonged QT interval Artifact in lead(s) I II III aVR aVL aVF V2 V3 Confirmed by Yolande Charleston [phone]) on 07/11/2024 2:44:25 PM  Radiology: No results found.   Procedures   Medications Ordered in the ED  enoxaparin  (LOVENOX ) injection 40 mg (40 mg Subcutaneous Given 07/12/24 1739)  acetaminophen  (TYLENOL ) tablet 650 mg (has no administration in time range)    Or  acetaminophen  (TYLENOL ) suppository 650 mg (has no administration in time range)  albuterol  (PROVENTIL ) (2.5 MG/3ML) 0.083% nebulizer solution 2.5 mg (has no administration in time range)  metoprolol  tartrate (LOPRESSOR ) tablet 50 mg (50 mg Oral Given 07/13/24 1043)  rosuvastatin  (CRESTOR ) tablet 40 mg (40 mg Oral Given 07/13/24 0645)  mirtazapine  (REMERON ) tablet 30 mg (30 mg Oral Given 07/12/24 2126)  traZODone  (DESYREL ) tablet 25-50 mg (25 mg Oral Given 07/13/24 0019)  megestrol  ([name] ) 400 MG/10ML suspension 625  mg (625 mg Oral Given 07/13/24 1044)  linaclotide  (LINZESS ) capsule 145 mcg (145 mcg Oral Given 07/13/24 0853)  polyethylene glycol (MIRALAX  / GLYCOLAX ) packet 17 g (has no administration in time range)  sodium bicarbonate  tablet 650 mg (650 mg Oral Given 07/13/24 1043)  tamsulosin  (FLOMAX ) capsule 0.4 mg (0.4 mg Oral Given 07/13/24 1043)  artificial tears ophthalmic solution 1 drop (has no administration in time range)  oxyCODONE  (Oxy IR/ROXICODONE ) immediate release tablet 5 mg (5 mg Oral Given 07/13/24 0105)  aspirin  EC tablet 81 mg (81 mg Oral Given 07/13/24 1043)  insulin  aspart (novoLOG ) injection 0-9 Units (2 Units Subcutaneous Given 07/13/24 1300)  insulin  aspart (novoLOG ) injection 0-5 Units (0 Units Subcutaneous Not Given 07/12/24 2127)  furosemide  (LASIX ) injection 40 mg (40 mg Intravenous Given 07/13/24 0849)  diltiazem  (CARDIZEM  SR) 12 hr capsule 60 mg (60 mg Oral Given 07/13/24 1043)  feeding supplement (ENSURE PLUS HIGH PROTEIN) liquid 237 mL (237 mLs Oral Patient Refused/Not Given 07/13/24 1510)  sodium chloride  flush (NS) 0.9 % injection 10-40 mL (20 mLs Intracatheter Given 07/13/24 1045)  sodium chloride  flush (NS) 0.9 % injection 10-40 mL (has  no administration in time range)  DAPTOmycin  (CUBICIN ) 800 mg in sodium chloride  0.9 % IVPB (has no administration in time range)  potassium chloride  SA (KLOR-CON  M) CR tablet 40 mEq (has no administration in time range)  metoprolol  tartrate (LOPRESSOR ) injection 5 mg (5 mg Intravenous Given 07/11/24 1412)  metoprolol  tartrate (LOPRESSOR ) injection 2.5 mg (2.5 mg Intravenous Given 07/11/24 1535)  metoprolol  tartrate (LOPRESSOR ) injection 2.5 mg (2.5 mg Intravenous Given 07/11/24 1616)  metoprolol  tartrate (LOPRESSOR ) tablet 50 mg (50 mg Oral Given 07/11/24 1614)  furosemide  (LASIX ) injection 40 mg (40 mg Intravenous Given 07/11/24 1617)  magnesium  sulfate IVPB 2 g 50 mL (0 g Intravenous Stopped 07/12/24 1515)  potassium chloride  SA (KLOR-CON  M) CR  tablet 40 mEq (40 mEq Oral Given 07/12/24 2127)    Clinical Course as of 07/13/24 1636  Fri Jul 11, 2024  1443 WBC(!): 12.7 [RP]  1446 Called son and he was told that the facility reported he looked short of breath. Says that he often gets anxious when he gets short of breath.  [RP]  1554 Discussed with Dr Claudene for admission [RP]    Clinical Course User Index [RP] Yolande Lamar BROCKS, MD                                 Medical Decision Making Amount and/or Complexity of Data Reviewed Labs: ordered. Decision-making details documented in ED Course. Radiology: ordered.  Risk Prescription drug management. Decision regarding hospitalization.   Celia Gibbons is a 78 year old male with a history of hypertension, hyperlipidemia, CHF, atrial fibrillation, CAD diabetes, and metastatic urothelial cancer status post nephrostomy as well as CKD stage IV who presents to the emergency department with tachycardia and hypertension.   Initial Ddx:  Sepsis, CHF exacerbation, atrial fibrillation with RVR, pneumonia  MDM/Course:  Patient presents emergency department initially for tachycardia and hypertension.  After talking to his son it appears that the primary problem is actually shortness of breath.  On exam he was satting well on room air.  Did not appreciate any abnormal breath sounds.  Was tachycardic and appears to be in atrial fibrillation with RVR.  Anxious but is alert and oriented.  Did have a CT head initially because there were some concerns of altered mental status that was normal.  Chest x-ray shows possible pulmonary edema.  Infection also in consideration but he is already on antibiotics.  VBG was normal.  His BNP was found to be markedly elevated.  Did send a procalcitonin to help guide care but suspect that his symptoms are likely due to heart failure and his atrial fibrillation with RVR.  Was given several doses of metoprolol  and upon re-evaluation heart rate has improved.  This patient  presents to the ED for concern of complaints listed in HPI, this involves an extensive number of treatment options, and is a complaint that carries with it a high risk of complications and morbidity. Disposition including potential need for admission considered.   Dispo: Admit to Floor  Additional history obtained from son Records reviewed DC Summary The following labs were independently interpreted: Chemistry and show CKD I independently reviewed the following imaging with scope of interpretation limited to determining acute life threatening conditions related to emergency care: Chest x-ray and agree with the radiologist interpretation with the following exceptions: none I personally reviewed and interpreted cardiac monitoring: atrial fibrillation with RVR I personally reviewed and interpreted the pt's EKG: see above  for interpretation  I have reviewed the patients home medications and made adjustments as needed Consults: Hospitalist Social Determinants of health:  Geriatric   CRITICAL CARE Performed by: Lamar JAYSON Shan   Total critical care time: 30 minutes  Critical care time was exclusive of separately billable procedures and treating other patients.  Critical care was necessary to treat or prevent imminent or life-threatening deterioration.  Critical care was time spent personally by me on the following activities: development of treatment plan with patient and/or surrogate as well as nursing, discussions with consultants, evaluation of patient's response to treatment, examination of patient, obtaining history from patient or surrogate, ordering and performing treatments and interventions, ordering and review of laboratory studies, ordering and review of radiographic studies, pulse oximetry and re-evaluation of patient's condition.  Portions of this note were generated with Scientist, clinical (histocompatibility and immunogenetics). Dictation errors may occur despite best attempts at proofreading.     Final  diagnoses:  Acute on chronic congestive heart failure, unspecified heart failure type (HCC)  Dyspnea, unspecified type  Atrial fibrillation with RVR [facility])    ED Discharge Orders     None          Shan Lamar JAYSON, MD 07/13/24 [phone]

## 2024-07-11 NOTE — ED Notes (Signed)
 [facility] (Spouse) at [facility] called asking for the admitting doctor to call for an update [phone]

## 2024-07-11 NOTE — H&P (Addendum)
 History and Physical    Patient: Adam Harris FMW:[id number] DOB: [birth year] DOA: 07/11/2024 DOS: the patient was seen and examined on 07/11/2024 PCP: [name], MD  Patient coming from: [facility] via EMS  Chief Complaint:  Chief Complaint  Patient presents with   Tachycardia   HPI: Adam Harris is a 78 y.o. 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  Review of records note that you just recently been hospitalized 8/15-8/28 after presenting with confusion after recent nephrostomy replacement.  He was found to have sepsis from MRSA bacteremia and Enterococcus faecalis secondary to a complicated urinary tract infection.  Patient was seen by infectious disease and recommended to complete a total of 4 weeks of daptomycin  to complete on 9/15.  He had been discharged to skilled nursing facility yesterday, but today presented back to the hospital due to complaints of chest pain and shortness of breath.  Over the phone patient's son notes that he seemed to have labored breathing over the phone prior to being discharged.  After getting to the facility patient reported having palpitations and chest discomfort.    With EMS patient was noted  to have occasional apneic spells and was given Narcan.  Upon admission into the emergency department patient was noted to be afebrile with pulse 87-1 21, respirations 21-34, blood pressures elevated up to 154/89, and O2 saturations maintained on room air.  Labs significant for WBC 12.7, hemoglobin 11.6, BUN 21, creatinine- 2.05, TSH - 5.579, free T4- 0.79, and alcohol  level undetectable.  Chest x-ray showing improved interstitial opacities.  UDS negative.   Blood cultures were obtained.  Patient had been ordered metoprolol  2.5 mg IV x 1 dose, furosemide  40 mg IV, and resumed on metoprolol  50 mg p.o.   Review of Systems: As mentioned in the history of present illness. All other systems reviewed and are negative. Past Medical History:  Diagnosis Date   Abnormal radiologic findings on diagnostic imaging of renal pelvis, ureter, or bladder    bilateral ureter abnormalities   Anticoagulant long-term use    eliquis    Anxiety    pt denies   Arthritis    Atrial fibrillation, chronic (HCC)    CAD (coronary artery disease) cardiologist-- dr hochrein   NSTEMI 02-04-2014  per cardiac cath chronic occluded RCA w/ faint left-to-right collaterals and aneurysmal LCFx with sluggish coronary flow/   NSTEMI --11-21-2016 per cardiac cath occluded proximal RCA & mid to diastal CFX 100%, med rx. If that does not work, PTCA or CABG   Cancer [facility])    bladder and kidney   CHF (congestive heart failure) (HCC)    CKD (chronic kidney disease), stage III (HCC)    patient unaware   DOE (dyspnea on exertion)    Fatty liver    pt denies   Hematuria 02/2019   History of COVID-19 10/2019   History of non-ST elevation myocardial infarction (NSTEMI)    02-04-2014  and 11-21-2016  cardiac cath done both times ,  medically management   History of shingles 12/2017   slight pain and numbness still noted in the area   Hyperlipidemia    Hypertension    Insomnia    Myocardial infarction (HCC) 2015   Persistent atrial fibrillation (HCC)  cardiologsit-- dr hochrein   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 Kennyth   Thoracic aortic atherosclerosis [facility])    Type 2 diabetes mellitus (HCC)    Urinary frequency    Past Surgical History:  Procedure Laterality Date   CARDIAC CATHETERIZATION N/A 11/21/2016   Procedure: Left Heart Cath and Coronary Angiography;  Surgeon: Debby DELENA Sor, MD;  Location: [facility]  INVASIVE CV LAB;  Service: Cardiovascular;  Laterality: N/A;  pRCA 100% , ostial LAD 45%, OM3 80%, mCFX to dCFX 100% (AV groove), lateral OM3 50%   COLONOSCOPY     CYSTOSCOPY WITH BIOPSY N/A 08/12/2020   Procedure: CYSTOSCOPY WITH BLADDER BIOPSY AND TRANSURETHRAL RESECTION OF BLADDER TUMOR;  Surgeon: [name], MD;  Location: [facility] ORS;  Service: Urology;  Laterality: N/A;   CYSTOSCOPY WITH BIOPSY N/A 11/10/2021   Procedure: CYSTOSCOPY WITH BLADDER BIOPSIES/ LEFT RETROGRADE;  Surgeon: [name], MD;  Location: [facility];  Service: Urology;  Laterality: N/A;   CYSTOSCOPY WITH BIOPSY Left 07/27/2022   Procedure: CYSTOSCOPY WITH  BLADDER BIOPSY, TRANSURETHRAL FULGERATION OF BLADDER, EXAM UNDER ANESTHESIA, LEFT RETROGRADE PYELOGRAM;  Surgeon: [name], MD;  Location: [facility];  Service: Urology;  Laterality: Left;   CYSTOSCOPY WITH BIOPSY N/A 04/23/2023   Procedure: CYSTOSCOPY WITH BLADDER AND PROSTATIC URETHRAL BIOPSIES;  Surgeon: [name], MD;  Location: [facility];  Service: Urology;  Laterality: N/A;  60 MINUTES NEEDED FOR CASE   CYSTOSCOPY WITH BIOPSY N/A 03/06/2024   Procedure: CYSTOSCOPY, WITH BIOPSY;  Surgeon: [name], MD;  Location: [facility];  Service: Urology;  Laterality: N/A;  CYSTOSCOPY WITH BLADDER BIOPSIES   CYSTOSCOPY WITH FULGERATION N/A 03/06/2024   Procedure: CYSTOSCOPY, WITH BLADDER FULGURATION, TURBT;  Surgeon: [name], MD;  Location: [facility];  Service: Urology;  Laterality: N/A;   CYSTOSCOPY WITH URETEROSCOPY AND STENT PLACEMENT Right 12/15/2019   Procedure: CYSTOSCOPY WITH RIGHT RETROGRADE/ RIGHT URETEROSCOPY/ BIOPSY;  Surgeon: Ottelin, Mark, MD;  Location: [facility];  Service: Urology;  Laterality: Right;   CYSTOSCOPY/RETROGRADE/URETEROSCOPY Bilateral 03/18/2018   Procedure: CYSTOSCOPY/RETROGRADE/URETEROSCOPY.;  Surgeon: Ottelin, Mark, MD;  Location: [facility];  Service: Urology;  Laterality: Bilateral;   EYE SURGERY Bilateral     cateract in January 2023   HYDROCELE EXCISION Left 07/27/2022   Procedure: HYDROCELE REPAIR;  Surgeon: [name], MD;  Location: [facility];  Service: Urology;  Laterality: Left;  GENERAL ANESTHESIA WITH PARALYSIS   IR FLUORO PROCEDURE UNLISTED  06/13/2024   IR NEPHROSTOMY EXCHANGE LEFT  03/10/2024   IR NEPHROSTOMY EXCHANGE LEFT  04/28/2024   IR NEPHROSTOMY EXCHANGE LEFT  06/27/2024   IR NEPHROSTOMY PLACEMENT LEFT  03/07/2024   IR TUNNELED CENTRAL VENOUS CATH PLC W IMG  07/04/2024   KNEE ARTHROSCOPY Right    LEFT ATRIAL APPENDAGE OCCLUSION N/A 12/13/2023   Procedure: LEFT ATRIAL APPENDAGE OCCLUSION;  Surgeon: [name], MD;  Location: [facility] INVASIVE CV LAB;  Service: Cardiovascular;  Laterality: N/A;   LEFT HEART CATHETERIZATION WITH CORONARY ANGIOGRAM N/A 02/04/2014   Procedure: LEFT HEART CATHETERIZATION WITH CORONARY ANGIOGRAM;  Surgeon: Deatrice DELENA Cage, MD;  Location: [facility] CATH LAB;  Service: Cardiovascular;  Laterality: N/A;  severe one-vessel CAD, chronically occluded RCA with faint left-to-right collaterals;  aneurysmal LCFx with sluggish coronary flow;  normal LVSF w/ moderately elevated LVEDP (ostialOM2 20%, pOM3 20%, pD1 20%, mCFX 50%, diffuse 20% pCFX)   PROSTATE BIOPSY N/A 08/12/2020   Procedure: BIOPSY TRANSRECTAL ULTRASONIC PROSTATE (TUBP);  Surgeon: [name], MD;  Location: [facility];  Service: Urology;  Laterality: N/A;   ROBOT ASSITED LAPAROSCOPIC NEPHROURETERECTOMY Right 03/22/2020   Procedure: XI ROBOT ASSITED LAPAROSCOPIC NEPHROURETERECTOMY;  Surgeon: [name], MD;  Location: [facility];  Service: Urology;  Laterality: Right;   TRANSESOPHAGEAL ECHOCARDIOGRAM (CATH LAB) N/A 12/13/2023   Procedure: TRANSESOPHAGEAL ECHOCARDIOGRAM;  Surgeon: [name], MD;  Location: [facility] INVASIVE CV LAB;  Service: Cardiovascular;  Laterality: N/A;   TRANSESOPHAGEAL ECHOCARDIOGRAM (CATH LAB) N/A 02/11/2024   Procedure: TRANSESOPHAGEAL ECHOCARDIOGRAM;  Surgeon: Barbaraann Darryle Ned, MD;   Location: [facility] INVASIVE CV LAB;  Service: Cardiovascular;  Laterality: N/A;   TRANSESOPHAGEAL ECHOCARDIOGRAM (CATH LAB) N/A 07/02/2024   Procedure: TRANSESOPHAGEAL ECHOCARDIOGRAM;  Surgeon: Francyne Headland, MD;  Location: [facility] INVASIVE CV LAB;  Service: Cardiovascular;  Laterality: N/A;   TRANSTHORACIC ECHOCARDIOGRAM  11-22-2016   dr hochrein   ef 55-60%/ mild MR and TR/ moderate LAE   TRANSURETHRAL RESECTION OF BLADDER TUMOR N/A 02/10/2021   Procedure: TRANSURETHRAL RESECTION OF BLADDER TUMOR (TURBT)/ CYSTOSCOPY/ LEFT RETROGRADE;  Surgeon: [name], MD;  Location: [facility];  Service: Urology;  Laterality: N/A;  GENERAL ANESTHESIA WITH PARALYSIS   Social History:  reports that he quit smoking about 53 years ago. His smoking use included cigarettes. He started smoking about 57 years ago. He has a 1 pack-year smoking history. He has been exposed to tobacco smoke. He quit smokeless tobacco use about 53 years ago.  His smokeless tobacco use included chew. He reports that he does not drink alcohol  and does not use drugs.  Allergies  Allergen Reactions   Xarelto  Elija.Eaves ] Other (See Comments)    -Skin Blisters     Family History  Problem Relation Age of Onset   Hypertension Mother    Cancer Mother        anal cancer   Esophageal cancer Neg Hx    Pancreatic cancer Neg Hx    Stomach cancer Neg Hx    Liver disease Neg Hx     Prior to Admission medications   Medication Sig Start Date End Date Taking? Authorizing Provider  aspirin  EC 81 MG tablet Take 1 tablet (81 mg total) by mouth daily. Swallow whole. 03/13/24  Yes [name], MD  Carboxymethylcell-Glycerin  PF 0.5-0.9 % SOLN Place 2 drops into both eyes 4 (four) times daily. 06/04/24  Yes [name], MD  daptomycin  (CUBICIN ) IVPB Inject 900 mg into the vein daily for 25 days. Indication:  MRSA/E faecalis bacteremia First Dose: Yes Last Day of Therapy:  07/28/24 Labs - Once weekly:  CBC/D, BMP, and CPK Labs - Once weekly: ESR and  CRP Method of administration: IV Push Method of administration may be changed at the discretion of home infusion pharmacist based upon assessment of the patient and/or caregiver's ability to self-administer the medication ordered. 07/10/24 08/04/24 Yes Gherghe, Costin M, MD  furosemide  (LASIX ) 40 MG tablet Take 1 tablet (40 mg total) by mouth daily as needed for fluid or edema. 07/10/24  Yes Gherghe, Costin M, MD  hydrOXYzine  (VISTARIL ) 25 MG capsule Take 1 capsule (25 mg total) by mouth every 8 (eight) hours as needed. Patient taking differently: Take 25 mg by mouth every 8 (eight) hours as needed for anxiety. 05/11/23  Yes [name], MD  isosorbide  mononitrate (IMDUR ) 30 MG 24 hr tablet Take 1 tablet (30 mg total) by mouth daily. 06/06/24  Yes Akula, Vijaya, MD  linaclotide  (LINZESS ) 145 MCG CAPS capsule Take 1 capsule (145 mcg total) by mouth daily  before breakfast. 06/19/24  Yes [name], MD  megestrol  ([name]  ES) 625 MG/5ML suspension Take 5 mLs (625 mg total) by mouth daily. 06/12/24  Yes [name], MD  metoprolol  tartrate (LOPRESSOR ) 50 MG tablet Take 1 tablet (50 mg total) by mouth 2 (two) times daily. 07/10/24  Yes Gherghe, Costin M, MD  mirtazapine  (REMERON ) 30 MG tablet Take 1 tablet (30 mg total) by mouth at bedtime. 06/12/24  Yes [name], MD  ondansetron  (ZOFRAN ) 8 MG tablet Take 1 tablet (8 mg total) by mouth every 8 (eight) hours as needed for nausea or vomiting. 06/04/24  Yes [name], MD  oxyCODONE  (OXY IR/ROXICODONE ) 5 MG immediate release tablet Take 1 tablet (5 mg total) by mouth every 4 (four) hours as needed for severe pain (pain score 7-10). 07/10/24  Yes Gherghe, Costin M, MD  polyethylene glycol (MIRALAX  / GLYCOLAX ) 17 g packet Take 17 g by mouth daily as needed for mild constipation. 06/03/24  Yes Akula, Vijaya, MD  prochlorperazine  (COMPAZINE ) 10 MG tablet Take 1 tablet (10 mg total) by mouth every 6 (six) hours as needed for nausea or vomiting. 06/04/24  Yes [name], MD   rosuvastatin  (CRESTOR ) 40 MG tablet TAKE 1 TABLET (40 MG TOTAL) BY MOUTH EVERY MORNING. PLEASE KEEP SCHEDULED APPOINTMENT 08/24/23  Yes [name], MD  sitaGLIPtin  (JANUVIA ) 50 MG tablet Take 1 tablet (50 mg total) by mouth daily. 05/19/24  Yes [name], MD  sodium bicarbonate  325 MG tablet Take 650 mg by mouth 2 (two) times daily.   Yes [provider]  tamsulosin  (FLOMAX ) 0.4 MG CAPS capsule TAKE ONE CAPSULE BY MOUTH DAILY 02/22/24  Yes [name], MD  traZODone  (DESYREL ) 50 MG tablet Take 0.5-1 tablets (25-50 mg total) by mouth at bedtime as needed for sleep. 10/16/23  Yes [name], MD    Physical Exam: Vitals:   07/11/24 1411 07/11/24 1500 07/11/24 1515 07/11/24 1530  BP: (!) 154/89 (!) 136/96 137/79   Pulse: (!) 110 (!) 102 87 89  Resp: (!) 21 (!) 28 (!) 21 (!) 34  Temp: 97.7 F (36.5 C)     TempSrc: Oral     SpO2: 96% 100% 95% 100%    Constitutional: [place] ill-appearing elderly male currently in no acute distress Eyes: PERRL, lids and conjunctivae normal ENMT: Mucous membranes are moist. Posterior pharynx clear of any exudate or lesions.Normal dentition.  Neck: normal, supple  Respiratory: Mildly tachypneic some intermittent crackles appreciated. Cardiovascular: Irregular irregular and tachycardic.  No extremity edema. 2+ pedal pulses.  IJ in place. Abdomen: no tenderness, no masses palpated.  . Bowel sounds positive.  Musculoskeletal: no clubbing / cyanosis. No joint deformity upper and lower extremities. Good ROM, no contractures. Normal muscle tone.  Skin: no rashes, lesions, ulcers. No induration Neurologic: CN 2-12 grossly intact.  Moves all extremities  psychiatric: Confused.  Alert and oriented to self.  Data Reviewed:  EKG revealed atrial fibrillation at 130 bpm.  Reviewed labs, imaging, and pertinent records as documented.  Assessment and Plan:  Permanent atrial fibrillation Patient presented with complaints of chest pain and was  noted to be in A-fib with heart rates elevated into the 130s.  Patient was ordered metoprolol  IV and oral metoprolol .  Patient is status post Watchman procedure. - Admit to a telemetry bed - Goal potassium at least 4 magnesium  at least 2.  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  Patient complains of  worsening shortness of breath.  O2 saturations maintained on room air.  Chest x-ray noting improved interstitial opacities.  Last echocardiogram noted EF to be 60 to 65% with indeterminate diastolic parameters when checked on 8/18.  Patient had been given Lasix  40 mg IV x 1 dose. - Strict I&O's and daily weights - Follow-up AWE(8714) - Lasix  40 mg IV daily.  Reassess and adjust diuresis as needed - Continue beta-blocker  Acute encephalopathy Previously patient had been discontinued off of morphine  as it was thought to be the cause for worsening confusion with patient's kidney function.  Currently on oxycodone  as needed for pain. - Delirium precautions - Decreased frequency of pain medication availability  Leukocytosis Acute.  WBC elevated at 12.7.  Thought initially to be possibly reactive in nature as patient has been on antibiotics for bacteremia.  Blood cultures were obtained. - Follow-up  blood cultures - Recheck CBC tomorrow morning  History of bacteremia Patient noted to have positive blood and urine cultures for MRSA and Enterococcus faecalis during previous hospitalization recommended continue IV antibiotics of daptomycin  through right IJ tunnel catheter until 9/15. 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  During hospital stay patient was evaluated by urology who felt nephrostomy tube did not need to be replaced at that time.  Essential hypertension Blood pressures initially  elevated up to 153/109. - Continue current blood pressure regimen  Uncontrolled diabetes mellitus type 2, without long-term use of insulin  Last hemoglobin A1c noted to be 8.3 when checked on 7/7. - Hypoglycemic protocols - Held Januvia  - CBGs before every meal with sensitive SSI - Adjust regimen as deemed medically appropriate  Microcytic anemia Chronic.  Hemoglobin is stable at 11.6. - Recheck CBC tomorrow morning  Chronic kidney disease stage IV Creatinine appears at baseline. - Continue sodium bicarb - Continue monitor  Subclinical hypothyroidism TSH- 5.579 with free T4- 0.79  DVT prophylaxis: Lovenox  Advance Care Planning:   Code Status: Limited: Do not attempt resuscitation (DNR) -DNR-LIMITED -Do Not Intubate/DNI     Consults: None  Family Communication: Patient's wife and Son updated over the phone  Severity of Illness: The appropriate patient status for this patient is OBSERVATION. Observation status is judged to be reasonable and necessary in order to provide the required intensity of service to ensure the patient's safety. The patient's presenting symptoms, physical exam findings, and initial radiographic and laboratory data in the context of their medical condition is felt to place them at decreased risk for further clinical deterioration. Furthermore, it is anticipated that the patient will be medically stable for discharge from the hospital within 2 midnights of admission.   Author: Maximino DELENA Sharps, MD 07/11/2024 3:55 PM  For on call review [email or web address].

## 2024-07-12 DIAGNOSIS — Z8616 Personal history of COVID-19: Secondary | ICD-10-CM | POA: Diagnosis not present

## 2024-07-12 DIAGNOSIS — D509 Iron deficiency anemia, unspecified: Secondary | ICD-10-CM | POA: Diagnosis present

## 2024-07-12 DIAGNOSIS — R627 Adult failure to thrive: Secondary | ICD-10-CM | POA: Diagnosis present

## 2024-07-12 DIAGNOSIS — E1165 Type 2 diabetes mellitus with hyperglycemia: Secondary | ICD-10-CM | POA: Diagnosis present

## 2024-07-12 DIAGNOSIS — G9340 Encephalopathy, unspecified: Secondary | ICD-10-CM | POA: Diagnosis not present

## 2024-07-12 DIAGNOSIS — Z66 Do not resuscitate: Secondary | ICD-10-CM | POA: Diagnosis present

## 2024-07-12 DIAGNOSIS — E1122 Type 2 diabetes mellitus with diabetic chronic kidney disease: Secondary | ICD-10-CM | POA: Diagnosis present

## 2024-07-12 DIAGNOSIS — I5033 Acute on chronic diastolic (congestive) heart failure: Secondary | ICD-10-CM | POA: Diagnosis present

## 2024-07-12 DIAGNOSIS — Z936 Other artificial openings of urinary tract status: Secondary | ICD-10-CM | POA: Diagnosis not present

## 2024-07-12 DIAGNOSIS — E038 Other specified hypothyroidism: Secondary | ICD-10-CM | POA: Diagnosis present

## 2024-07-12 DIAGNOSIS — E43 Unspecified severe protein-calorie malnutrition: Secondary | ICD-10-CM | POA: Diagnosis present

## 2024-07-12 DIAGNOSIS — Z515 Encounter for palliative care: Secondary | ICD-10-CM | POA: Diagnosis not present

## 2024-07-12 DIAGNOSIS — I5032 Chronic diastolic (congestive) heart failure: Secondary | ICD-10-CM | POA: Diagnosis not present

## 2024-07-12 DIAGNOSIS — I2510 Atherosclerotic heart disease of native coronary artery without angina pectoris: Secondary | ICD-10-CM | POA: Diagnosis present

## 2024-07-12 DIAGNOSIS — I130 Hypertensive heart and chronic kidney disease with heart failure and stage 1 through stage 4 chronic kidney disease, or unspecified chronic kidney disease: Secondary | ICD-10-CM | POA: Diagnosis present

## 2024-07-12 DIAGNOSIS — B9562 Methicillin resistant Staphylococcus aureus infection as the cause of diseases classified elsewhere: Secondary | ICD-10-CM | POA: Diagnosis present

## 2024-07-12 DIAGNOSIS — G928 Other toxic encephalopathy: Secondary | ICD-10-CM | POA: Diagnosis present

## 2024-07-12 DIAGNOSIS — B952 Enterococcus as the cause of diseases classified elsewhere: Secondary | ICD-10-CM | POA: Diagnosis present

## 2024-07-12 DIAGNOSIS — N136 Pyonephrosis: Secondary | ICD-10-CM | POA: Diagnosis present

## 2024-07-12 DIAGNOSIS — I509 Heart failure, unspecified: Secondary | ICD-10-CM | POA: Diagnosis not present

## 2024-07-12 DIAGNOSIS — E785 Hyperlipidemia, unspecified: Secondary | ICD-10-CM | POA: Diagnosis present

## 2024-07-12 DIAGNOSIS — Z452 Encounter for adjustment and management of vascular access device: Secondary | ICD-10-CM | POA: Diagnosis not present

## 2024-07-12 DIAGNOSIS — I4891 Unspecified atrial fibrillation: Secondary | ICD-10-CM | POA: Diagnosis not present

## 2024-07-12 DIAGNOSIS — I272 Pulmonary hypertension, unspecified: Secondary | ICD-10-CM | POA: Diagnosis present

## 2024-07-12 DIAGNOSIS — N184 Chronic kidney disease, stage 4 (severe): Secondary | ICD-10-CM | POA: Diagnosis present

## 2024-07-12 DIAGNOSIS — R000 Tachycardia, unspecified: Secondary | ICD-10-CM | POA: Diagnosis present

## 2024-07-12 DIAGNOSIS — L89152 Pressure ulcer of sacral region, stage 2: Secondary | ICD-10-CM | POA: Diagnosis present

## 2024-07-12 DIAGNOSIS — Z7901 Long term (current) use of anticoagulants: Secondary | ICD-10-CM | POA: Diagnosis not present

## 2024-07-12 DIAGNOSIS — C689 Malignant neoplasm of urinary organ, unspecified: Secondary | ICD-10-CM | POA: Diagnosis present

## 2024-07-12 DIAGNOSIS — I4821 Permanent atrial fibrillation: Secondary | ICD-10-CM | POA: Diagnosis present

## 2024-07-12 LAB — CBC
HCT: 34.6 % — ABNORMAL LOW (ref 39.0–52.0)
Hemoglobin: 10.7 g/dL — ABNORMAL LOW (ref 13.0–17.0)
MCH: 23.6 pg — ABNORMAL LOW (ref 26.0–34.0)
MCHC: 30.9 g/dL (ref 30.0–36.0)
MCV: 76.2 fL — ABNORMAL LOW (ref 80.0–100.0)
Platelets: 322 K/uL (ref 150–400)
RBC: 4.54 MIL/uL (ref 4.22–5.81)
RDW: 24.4 % — ABNORMAL HIGH (ref 11.5–15.5)
WBC: 15.2 K/uL — ABNORMAL HIGH (ref 4.0–10.5)
nRBC: 0.3 % — ABNORMAL HIGH (ref 0.0–0.2)

## 2024-07-12 LAB — BASIC METABOLIC PANEL WITH GFR
Anion gap: 12 (ref 5–15)
BUN: 23 mg/dL (ref 8–23)
CO2: 23 mmol/L (ref 22–32)
Calcium: 7.9 mg/dL — ABNORMAL LOW (ref 8.9–10.3)
Chloride: 105 mmol/L (ref 98–111)
Creatinine, Ser: 1.93 mg/dL — ABNORMAL HIGH (ref 0.61–1.24)
GFR, Estimated: 35 mL/min — ABNORMAL LOW (ref >60)
Glucose, Bld: 118 mg/dL — ABNORMAL HIGH (ref 70–99)
Potassium: 3.6 mmol/L (ref 3.5–5.1)
Sodium: 140 mmol/L (ref 135–145)

## 2024-07-12 LAB — CULTURE, BLOOD (ROUTINE X 2)
Culture: NO GROWTH
Culture: NO GROWTH
Special Requests: ADEQUATE
Special Requests: ADEQUATE

## 2024-07-12 LAB — GLUCOSE, CAPILLARY
Glucose-Capillary: 129 mg/dL — ABNORMAL HIGH (ref 70–99)
Glucose-Capillary: 133 mg/dL — ABNORMAL HIGH (ref 70–99)
Glucose-Capillary: 154 mg/dL — ABNORMAL HIGH (ref 70–99)
Glucose-Capillary: 146 mg/dL — ABNORMAL HIGH (ref 70–99)

## 2024-07-12 LAB — MAGNESIUM: Magnesium: 1.6 mg/dL — ABNORMAL LOW (ref 1.7–2.4)

## 2024-07-12 MED ORDER — ENSURE PLUS HIGH PROTEIN PO LIQD
237.0000 mL | Freq: Two times a day (BID) | ORAL | Status: DC
  Administered 2024-07-12 – 2024-07-15 (×4): 237 mL via ORAL

## 2024-07-12 MED ORDER — POTASSIUM CHLORIDE CRYS ER 20 MEQ PO TBCR
40.0000 meq | EXTENDED_RELEASE_TABLET | Freq: Two times a day (BID) | ORAL | Status: AC
Start: 2024-07-12 — End: 2024-07-12
  Administered 2024-07-12 (×2): 40 meq via ORAL
  Filled 2024-07-12 (×2): qty 2

## 2024-07-12 MED ORDER — MAGNESIUM SULFATE 2 GM/50ML IV SOLN
2.0000 g | Freq: Once | INTRAVENOUS | Status: AC
  Administered 2024-07-12: 2 g via INTRAVENOUS
  Filled 2024-07-12: qty 50

## 2024-07-12 MED ORDER — DILTIAZEM HCL ER 60 MG PO CP12
60.0000 mg | ORAL_CAPSULE | Freq: Two times a day (BID) | ORAL | Status: DC
  Administered 2024-07-12 – 2024-07-15 (×7): 60 mg via ORAL
  Filled 2024-07-12 (×8): qty 1

## 2024-07-12 MED ORDER — CHLORHEXIDINE GLUCONATE CLOTH 2 % EX PADS
6.0000 | MEDICATED_PAD | Freq: Every day | CUTANEOUS | Status: DC
  Administered 2024-07-12 – 2024-07-13 (×2): 6 via TOPICAL

## 2024-07-12 MED ORDER — FUROSEMIDE 10 MG/ML IJ SOLN
40.0000 mg | Freq: Two times a day (BID) | INTRAMUSCULAR | Status: DC
  Administered 2024-07-12 – 2024-07-14 (×5): 40 mg via INTRAVENOUS
  Filled 2024-07-12 (×5): qty 4

## 2024-07-12 NOTE — Progress Notes (Signed)
 8/30 I spoke to patient's son telephonically @ [phone]. Patient was unable to relate to conversation, information was acknowledged and confirmed by son.

## 2024-07-12 NOTE — Progress Notes (Signed)
 PROGRESS NOTE    Adam Harris  FMW:[id number] DOB: 1946/05/31 DOA: 07/11/2024 PCP: [name], MD   78/M chronically ill with metastatic urothelial carcinoma, recent nephrostomy tube, CAD, type 2 diabetes mellitus, atrial fibrillation not on anticoagulation, diastolic CHF CKD 4, prostate cancer presented to the ED with chest pain and shortness of breath - Recently hospitalized 8/15-28 with sepsis from MRSA bacteremia and Enterococcus faecalis secondary to a complicated urinary tract infection.  [facility] to [facility] to complete 4 weeks of daptomycin  on 9/15 via IJ catheter. - Patient reports being short of breath for a few days, progressively worsened in rehab yesterday and brought back to the ED, allegedly also has occasional apneic spells given Narcan by EMS. - In the ED tachycardic, tachypneic, A-fib RVR labs noted WBC 12.7, creatinine 2, chest x-ray with interstitial opacities  Subjective: -Complains of shortness of breath  Assessment and Plan:  Acute on chronic diastolic CHF Severe pulmonary hypertension - Last echo 8/25 with EF 65-70%, moderate LVH, indeterminate diastolic function, RV normal, severely elevated PASP RVSP 79, Watchman device noted  -Continue Lasix  40 mg twice daily today, Toprol , - GDMT limited by CKD  Permanent atrial fibrillation with RVR -Continue with metoprolol  50 mg BID, add low-dose diltiazem  - Has a watchman's device, not on anticoagulation   Acute encephalopathy - Secondary to polypharmacy, recent infection, awake and alert, oriented X2 this morning, monitor   Leukocytosis -UA and x-ray appear unremarkable, has a recent percutaneous nephrostomy and new central line -Follow-up blood cultures   Complicated bacteremia, MRSA and E faecalis -Secondary to pyelonephritis, recent nephrostomy tube -Seen by ID last admission, recommended daptomycin  for 4 weeks to end on 9/15, felt to potentially need suppression thereafter with oral ABX since [facility]  retained  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  During hospital stay patient was evaluated by urology who felt nephrostomy tube did not need to be replaced at that time.   Uncontrolled diabetes mellitus type 2, without long-term use of insulin  Last hemoglobin A1c noted to be 8.3 when checked on 7/7. Stable, SSI for now   Microcytic anemia Chronic.  Hemoglobin is stable at 11.6. - Recheck CBC tomorrow morning   Chronic kidney disease stage IV Creatinine appears at baseline.   Subclinical hypothyroidism TSH- 5.579 with free T4- 0.79  Severe malnutrition Albumin  was 2.1  Goals, this is a chronically ill debilitated male with metastatic urothelial carcinoma, recent complicated bacteremia, CHF, severe pulmonary hypertension, CKD 4, A-fib, diabetes, malnutrition with ongoing failure to thrive, overall prognosis is poor, seen by palliative care last admission, continue goals of care conversations   DVT prophylaxis: Lovenox  Advance Care Planning: DNR Family Communication: No family at bedside will attempt to contact spouse Disposition Plan:   Consultants:    Procedures:   Antimicrobials:    Objective: Vitals:   07/11/24 2100 07/11/24 2339 07/12/24 0358 07/12/24 0707  BP:  119/82 135/85   Pulse: 90 90 (!) 106   Resp: (!) 24 17 19    Temp:  98.2 F (36.8 C) 98.4 F (36.9 C) 98.7 F (37.1 C)  TempSrc:  Oral Oral Oral  SpO2: 97% 94% 91%   Weight:   80.6 kg   Height:        Intake/Output Summary (Last 24 hours) at 07/12/2024 1017 Last data filed at 07/12/2024 0835 Gross per 24 hour  Intake 260 ml  Output 2000 ml  Net -1740 ml  Filed Weights   07/12/24 0358  Weight: 80.6 kg    Examination:  General exam: Chronically ill elderly male laying in bed, mild tachypnea HEENT: Positive JVD, tunneled right IJ catheter Respiratory system: Poor air  movement bilaterally Cardiovascular system: S1 & S2 heard, regular rhythm Abd: nondistended, soft and nontender.Normal bowel sounds heard. Central nervous system: Alert and oriented. No focal neurological deficits. Extremities: 1+ edema Skin: No rashes Psychiatry:  Mood & affect appropriate.     Data Reviewed:   CBC: Recent Labs  Lab 07/06/24 1218 07/07/24 0543 07/09/24 0045 07/11/24 1233 07/12/24 0500  WBC 5.1 5.5 5.5 12.7* 15.2*  NEUTROABS 4.4  --  2.0 9.5*  --   HGB 10.1* 10.7* 9.9* 11.6* 10.7*  HCT 33.1* 35.2* 32.5* 37.8* 34.6*  MCV 76.4* 76.9* 78.1* 77.3* 76.2*  PLT 349 358 278 309 322   Basic Metabolic Panel: Recent Labs  Lab 07/06/24 1321 07/07/24 0543 07/09/24 0045 07/10/24 0829 07/11/24 1233 07/12/24 0500  NA  --  142 140 138 138 140  K  --  4.2 3.7 3.1* 4.2 3.6  CL  --  114* 112* 108 104 105  CO2  --  19* 21* 21* 20* 23  GLUCOSE  --  145* 213* 205* 182* 118*  BUN  --  25* 27* 20 21 23   CREATININE  --  1.98* 2.24* 1.62* 2.05* 1.93*  CALCIUM   --  8.4* 8.2* 8.0* 8.4* 7.9*  MG 1.9 1.9  --   --   --  1.6*   GFR: Estimated Creatinine Clearance: 34.1 mL/min (A) (by C-G formula based on SCr of 1.93 mg/dL (H)). Liver Function Tests: Recent Labs  Lab 07/11/24 1233  AST 33  ALT 26  ALKPHOS 118  BILITOT 3.4*  PROT 7.4  ALBUMIN  2.1*   No results for input(s): LIPASE, AMYLASE in the last 168 hours. Recent Labs  Lab 07/11/24 1233  AMMONIA 29   Coagulation Profile: No results for input(s): INR, PROTIME in the last 168 hours. Cardiac Enzymes: Recent Labs  Lab 07/07/24 0543  CKTOTAL 32*   BNP (last 3 results) No results for input(s): PROBNP in the last 8760 hours. HbA1C: No results for input(s): HGBA1C in the last 72 hours. CBG: Recent Labs  Lab 07/10/24 1202 07/10/24 1531 07/11/24 1322 07/11/24 2213 07/12/24 0622  GLUCAP 154* 219* 178* 192* 129*   Lipid Profile: No results for input(s): CHOL, HDL, LDLCALC, TRIG,  CHOLHDL, LDLDIRECT in the last 72 hours. Thyroid  Function Tests: Recent Labs    07/11/24 1233  TSH 5.579*  FREET4 0.79   Anemia Panel: No results for input(s): VITAMINB12, FOLATE, FERRITIN, TIBC, IRON, RETICCTPCT in the last 72 hours. Urine analysis:    Component Value Date/Time   COLORURINE YELLOW 07/11/2024 1404   APPEARANCEUR CLEAR 07/11/2024 1404   LABSPEC 1.008 07/11/2024 1404   PHURINE 7.0 07/11/2024 1404   GLUCOSEU NEGATIVE 07/11/2024 1404   GLUCOSEU NEGATIVE 12/27/2023 1039   HGBUR SMALL (A) 07/11/2024 1404   BILIRUBINUR NEGATIVE 07/11/2024 1404   KETONESUR NEGATIVE 07/11/2024 1404   PROTEINUR 100 (A) 07/11/2024 1404   UROBILINOGEN 4.0 (A) 12/27/2023 1039   NITRITE NEGATIVE 07/11/2024 1404   LEUKOCYTESUR TRACE (A) 07/11/2024 1404   Sepsis Labs: @LABRCNTIP (procalcitonin:4,lacticidven:4)  ) Recent Results (from the past 240 hours)  Culture, blood (Routine X 2) w Reflex to ID Panel     Status: None   Collection Time: 07/07/24  6:34 AM   Specimen: BLOOD  Result Value Ref Range Status  Specimen Description BLOOD RIGHT ANTECUBITAL  Final   Special Requests   Final    BOTTLES DRAWN AEROBIC ONLY Blood Culture adequate volume   Culture   Final    NO GROWTH 5 DAYS Performed at [facility], [address], [place], [place] [address]    Report Status 07/12/2024 FINAL  Final  Culture, blood (Routine X 2) w Reflex to ID Panel     Status: None   Collection Time: 07/07/24  6:35 AM   Specimen: BLOOD  Result Value Ref Range Status   Specimen Description BLOOD LEFT ANTECUBITAL  Final   Special Requests   Final    BOTTLES DRAWN AEROBIC ONLY Blood Culture adequate volume   Culture   Final    NO GROWTH 5 DAYS Performed at [facility], [address] [place], [place] [address]    Report Status 07/12/2024 FINAL  Final  Blood culture (routine x 2)     Status: None (Preliminary result)   Collection Time: 07/11/24 12:49 PM   Specimen: BLOOD   Result Value Ref Range Status   Specimen Description BLOOD SITE NOT SPECIFIED  Final   Special Requests   Final    BOTTLES DRAWN AEROBIC AND ANAEROBIC Blood Culture results may not be optimal due to an inadequate volume of blood received in culture bottles   Culture   Final    NO GROWTH < 24 HOURS Performed at [facility], [address], [place], [place] [address]    Report Status PENDING  Incomplete  Blood culture (routine x 2)     Status: None (Preliminary result)   Collection Time: 07/11/24 12:52 PM   Specimen: BLOOD  Result Value Ref Range Status   Specimen Description BLOOD SITE NOT SPECIFIED  Final   Special Requests   Final    BOTTLES DRAWN AEROBIC AND ANAEROBIC Blood Culture results may not be optimal due to an inadequate volume of blood received in culture bottles   Culture   Final    NO GROWTH < 24 HOURS Performed at [facility], [address], [place], [place] [address]    Report Status PENDING  Incomplete  MRSA Next Gen by PCR, Nasal     Status: Abnormal   Collection Time: 07/11/24  5:42 PM   Specimen: Nasal Mucosa; Nasal Swab  Result Value Ref Range Status   MRSA by PCR Next Gen DETECTED (A) NOT DETECTED Final    Comment: RESULT CALLED TO, READ BACK BY AND VERIFIED WITH: RN JESSICA G [id number] FCP (NOTE) The GeneXpert MRSA Assay (FDA approved for NASAL specimens only), is one component of a comprehensive MRSA colonization surveillance program. It is not intended to diagnose MRSA infection nor to guide or monitor treatment for MRSA infections. Test performance is not FDA approved in patients less than 43 years old. Performed at [facility], [address], [place], [place] [address]      Radiology Studies: DG Chest Portable 1 View Result Date: 07/11/2024 CLINICAL DATA:  Shortness of breath.  Tachycardia. EXAM: PORTABLE CHEST 1 VIEW COMPARISON:  Radiograph 2 days ago 07/09/2024 FINDINGS: Right-sided intra line tip projects over the SVC.  Cardiomegaly is unchanged. Stable mediastinal contours with aortic atherosclerosis. Improving interstitial opacities. No pneumothorax or large pleural effusion. No confluent consolidation. Stable osseous structures. IMPRESSION: 1. Improving interstitial opacities, improving pulmonary edema versus infection. 2. Unchanged cardiomegaly. Electronically Signed   By: Andrea Gasman M.D.   On: 07/11/2024  15:24   CT HEAD WO CONTRAST Result Date: 07/11/2024 EXAM: CT HEAD WITHOUT CONTRAST 07/11/2024 01:07:17 PM TECHNIQUE: CT of the head was performed without the administration of intravenous contrast. Automated exposure control, iterative reconstruction, and/or weight based adjustment of the mA/kV was utilized to reduce the radiation dose to as low as reasonably achievable. COMPARISON: CT head 06/28/2024 CLINICAL HISTORY: AMS. Pt BIB [facility] from [facility], was called due to tachycardia and hypertension. FINDINGS: BRAIN AND VENTRICLES: Within limitations of motion, no acute infarct, intracranial hemorrhage, mass, midline shift, hydrocephalus, or extra-axial fluid collection is identified. Mild cerebral atrophy. Grossly unchanged cerebral white matter hypodensities, nonspecific but compatible with mild chronic small vessel ischemic disease. ORBITS: Left cataract extraction. SINUSES: No acute abnormality. SOFT TISSUES AND SKULL: Calcified atherosclerosis at the skull base. Venous gas in the scalp and infratemporal fossa bilaterally, likely iatrogenic. IMPRESSION: 1. No evidence of an acute intracranial abnormality on this motion-degraded study. Electronically signed by: Dasie Hamburg MD 07/11/2024 01:21 PM EDT RP Workstation: [id number]     Scheduled Meds:  aspirin  EC  81 mg Oral Daily   Chlorhexidine  Gluconate Cloth  6 each Topical Q0600   enoxaparin  (LOVENOX ) injection  40 mg Subcutaneous Q24H   furosemide   40 mg Intravenous BID   insulin  aspart  0-5 Units Subcutaneous QHS   insulin  aspart  0-9 Units  Subcutaneous TID WC   linaclotide   145 mcg Oral QAC breakfast   megestrol   625 mg Oral Daily   metoprolol  tartrate  50 mg Oral BID   mirtazapine   30 mg Oral QHS   rosuvastatin   40 mg Oral BH-q7a   sodium bicarbonate   650 mg Oral BID   sodium chloride  flush  3 mL Intravenous Q12H   tamsulosin   0.4 mg Oral Daily   Continuous Infusions:  DAPTOmycin  900 mg (07/11/24 1907)     LOS: 0 days    Time spent:    Sigurd Pac, MD [facility]   07/12/2024, 10:17 AM

## 2024-07-12 NOTE — Care Management Obs Status (Signed)
 MEDICARE OBSERVATION STATUS NOTIFICATION   Patient Details  Name: Adam Harris MRN: [id number] Date of Birth: 1946-01-02   Medicare Observation Status Notification Given:  Yes    Jon Cruel 07/12/2024, 12:08 PM

## 2024-07-13 DIAGNOSIS — I5032 Chronic diastolic (congestive) heart failure: Secondary | ICD-10-CM | POA: Diagnosis not present

## 2024-07-13 DIAGNOSIS — G9340 Encephalopathy, unspecified: Secondary | ICD-10-CM | POA: Diagnosis not present

## 2024-07-13 LAB — CBC
HCT: 33.1 % — ABNORMAL LOW (ref 39.0–52.0)
Hemoglobin: 10.2 g/dL — ABNORMAL LOW (ref 13.0–17.0)
MCH: 23.5 pg — ABNORMAL LOW (ref 26.0–34.0)
MCHC: 30.8 g/dL (ref 30.0–36.0)
MCV: 76.3 fL — ABNORMAL LOW (ref 80.0–100.0)
Platelets: 245 K/uL (ref 150–400)
RBC: 4.34 MIL/uL (ref 4.22–5.81)
RDW: 24.7 % — ABNORMAL HIGH (ref 11.5–15.5)
WBC: 12.9 K/uL — ABNORMAL HIGH (ref 4.0–10.5)
nRBC: 0.2 % (ref 0.0–0.2)

## 2024-07-13 LAB — BASIC METABOLIC PANEL WITH GFR
Anion gap: 11 (ref 5–15)
BUN: 21 mg/dL (ref 8–23)
CO2: 22 mmol/L (ref 22–32)
Calcium: 7.7 mg/dL — ABNORMAL LOW (ref 8.9–10.3)
Chloride: 106 mmol/L (ref 98–111)
Creatinine, Ser: 2.13 mg/dL — ABNORMAL HIGH (ref 0.61–1.24)
GFR, Estimated: 31 mL/min — ABNORMAL LOW (ref >60)
Glucose, Bld: 138 mg/dL — ABNORMAL HIGH (ref 70–99)
Potassium: 3.7 mmol/L (ref 3.5–5.1)
Sodium: 139 mmol/L (ref 135–145)

## 2024-07-13 LAB — GLUCOSE, CAPILLARY
Glucose-Capillary: 138 mg/dL — ABNORMAL HIGH (ref 70–99)
Glucose-Capillary: 161 mg/dL — ABNORMAL HIGH (ref 70–99)
Glucose-Capillary: 176 mg/dL — ABNORMAL HIGH (ref 70–99)
Glucose-Capillary: 201 mg/dL — ABNORMAL HIGH (ref 70–99)

## 2024-07-13 MED ORDER — POTASSIUM CHLORIDE CRYS ER 20 MEQ PO TBCR
40.0000 meq | EXTENDED_RELEASE_TABLET | Freq: Once | ORAL | Status: AC
  Administered 2024-07-13: 40 meq via ORAL
  Filled 2024-07-13: qty 2

## 2024-07-13 MED ORDER — SODIUM CHLORIDE 0.9 % IV SOLN
800.0000 mg | Freq: Every day | INTRAVENOUS | Status: DC
  Administered 2024-07-14: 800 mg via INTRAVENOUS
  Filled 2024-07-13 (×2): qty 16

## 2024-07-13 MED ORDER — SODIUM CHLORIDE 0.9% FLUSH
10.0000 mL | Freq: Two times a day (BID) | INTRAVENOUS | Status: DC
  Administered 2024-07-13: 10 mL
  Administered 2024-07-13: 20 mL
  Administered 2024-07-14 (×2): 10 mL

## 2024-07-13 MED ORDER — SODIUM CHLORIDE 0.9% FLUSH: 10.0000 mL | INTRAVENOUS | Status: DC | PRN

## 2024-07-13 NOTE — Progress Notes (Addendum)
 PROGRESS NOTE    Adam Harris  FMW:[id number] DOB: [birth year] DOA: 07/11/2024 PCP: [name], MD   78/M chronically ill with metastatic urothelial carcinoma, recent nephrostomy tube, CAD, type 2 diabetes mellitus, atrial fibrillation not on anticoagulation, diastolic CHF CKD 4, prostate cancer presented to the ED with chest pain and shortness of breath - Recently hospitalized 8/15-28 with sepsis from MRSA bacteremia and Enterococcus faecalis secondary to a complicated urinary tract infection.  Discharged to SNF 827 to complete 4 weeks of daptomycin  on 9/15 via IJ catheter. - Patient reports being short of breath for a few days, progressively worsened in rehab yesterday and brought back to the ED, allegedly also has occasional apneic spells given Narcan by EMS. - In the ED tachycardic, tachypneic, A-fib RVR labs noted WBC 12.7, creatinine 2, chest x-ray with interstitial opacities. - Admitted, started on diuretics, oral diltiazem   Subjective: - Feels better today, breathing is improving  Assessment and Plan:  Acute on chronic diastolic CHF Severe pulmonary hypertension - Last echo 8/25 with EF 65-70%, moderate LVH, indeterminate diastolic function, RV normal, severely elevated PASP RVSP 79, Watchman device noted  -Continue Lasix  40 mg twice daily today, Toprol , - GDMT limited by CKD - Possibly transition to oral diuretics tomorrow, - Increase activity, PT OT eval - Overall poor prognosis with metastatic cancer and multiple medical problems, seen by palliative care last admission, son considering Hospice at [place], will ask TOC to set this up  Permanent atrial fibrillation with RVR -Continue with metoprolol  50 mg BID, continue diltiazem  - Has a watchman's device, not on anticoagulation   Acute encephalopathy - Secondary to polypharmacy, recent infection, awake and alert, oriented X2 this morning, monitor   Leukocytosis -UA and x-ray appear unremarkable, has a recent percutaneous  nephrostomy and new central line - Improving, blood cultures negative so far   Complicated bacteremia, MRSA and E faecalis -Secondary to pyelonephritis, recent nephrostomy tube -Seen by ID last admission, recommended daptomycin  for 4 weeks to end on 9/15, felt to potentially need suppression thereafter with oral ABX since [facility] retained  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  During hospital stay patient was evaluated by urology who felt nephrostomy tube did not need to be replaced at that time.   Uncontrolled diabetes mellitus type 2, without long-term use of insulin  Last hemoglobin A1c noted to be 8.3 when checked on 7/7. Stable, SSI for now   Microcytic anemia -Stable   Chronic kidney disease stage IV Creatinine appears at baseline.   Subclinical hypothyroidism TSH- 5.579 with free T4- 0.79  Severe malnutrition Albumin  was 2.1  Goals, this is a chronically ill debilitated male with metastatic urothelial carcinoma, recent complicated bacteremia, CHF, severe pulmonary hypertension, CKD 4, A-fib, diabetes, malnutrition with ongoing failure to thrive, overall prognosis is poor, seen by palliative care last admission, continue goals of care conversations   DVT prophylaxis: Lovenox  Advance Care Planning: DNR Family Communication: No family at bedside will attempt to contact spouse Disposition Plan: Anticipate SNF  Consultants:    Procedures:   Antimicrobials: Daptomycin    Objective: Vitals:   07/12/24 2316 07/13/24 0409 07/13/24 0819 07/13/24 0900  BP: 98/67 105/68 106/69   Pulse: 87 86 86   Resp: 20 18 19    Temp: 97.6 F (36.4 C) 98.1 F (36.7 C) 98 F (36.7 C)   TempSrc: Oral Oral Oral   SpO2: 95% 91% 95% 96%  Weight:  80.3  kg    Height:        Intake/Output Summary (Last 24 hours) at 07/13/2024 1122 Last data filed at 07/13/2024  1051 Gross per 24 hour  Intake 720 ml  Output 2075 ml  Net -1355 ml   Filed Weights   07/12/24 0358 07/12/24 1746 07/13/24 0409  Weight: 80.6 kg 80 kg 80.3 kg    Examination:  General exam: Chronically ill elderly male laying in bed, mild tachypnea HEENT: Positive JVD, tunneled right IJ catheter, positive JVD Respiratory system: End moving air movement Cardiovascular system: S1 & S2 heard, regular rhythm Abd: nondistended, soft and nontender.Normal bowel sounds heard. Central nervous system: Alert and oriented. No focal neurological deficits. Extremities: Trace edema Skin: No rashes Psychiatry: Flat affect    Data Reviewed:   CBC: Recent Labs  Lab 07/06/24 1218 07/07/24 0543 07/09/24 0045 07/11/24 1233 07/12/24 0500 07/13/24 0510  WBC 5.1 5.5 5.5 12.7* 15.2* 12.9*  NEUTROABS 4.4  --  2.0 9.5*  --   --   HGB 10.1* 10.7* 9.9* 11.6* 10.7* 10.2*  HCT 33.1* 35.2* 32.5* 37.8* 34.6* 33.1*  MCV 76.4* 76.9* 78.1* 77.3* 76.2* 76.3*  PLT 349 358 278 309 322 245   Basic Metabolic Panel: Recent Labs  Lab 07/06/24 1321 07/07/24 0543 07/09/24 0045 07/10/24 0829 07/11/24 1233 07/12/24 0500 07/13/24 0510  NA  --  142 140 138 138 140 139  K  --  4.2 3.7 3.1* 4.2 3.6 3.7  CL  --  114* 112* 108 104 105 106  CO2  --  19* 21* 21* 20* 23 22  GLUCOSE  --  145* 213* 205* 182* 118* 138*  BUN  --  25* 27* 20 21 23 21   CREATININE  --  1.98* 2.24* 1.62* 2.05* 1.93* 2.13*  CALCIUM   --  8.4* 8.2* 8.0* 8.4* 7.9* 7.7*  MG 1.9 1.9  --   --   --  1.6*  --    GFR: Estimated Creatinine Clearance: 30.9 mL/min (A) (by C-G formula based on SCr of 2.13 mg/dL (H)). Liver Function Tests: Recent Labs  Lab 07/11/24 1233  AST 33  ALT 26  ALKPHOS 118  BILITOT 3.4*  PROT 7.4  ALBUMIN  2.1*   No results for input(s): LIPASE, AMYLASE in the last 168 hours. Recent Labs  Lab 07/11/24 1233  AMMONIA 29   Coagulation Profile: No results for input(s): INR, PROTIME in the last 168  hours. Cardiac Enzymes: Recent Labs  Lab 07/07/24 0543  CKTOTAL 32*   BNP (last 3 results) No results for input(s): PROBNP in the last 8760 hours. HbA1C: No results for input(s): HGBA1C in the last 72 hours. CBG: Recent Labs  Lab 07/12/24 0622 07/12/24 1120 07/12/24 1604 07/12/24 2103 07/13/24 0608  GLUCAP 129* 133* 154* 146* 138*   Lipid Profile: No results for input(s): CHOL, HDL, LDLCALC, TRIG, CHOLHDL, LDLDIRECT in the last 72 hours. Thyroid  Function Tests: Recent Labs    07/11/24 1233  TSH 5.579*  FREET4 0.79   Anemia Panel: No results for input(s): VITAMINB12, FOLATE, FERRITIN, TIBC, IRON, RETICCTPCT in the last 72 hours. Urine analysis:    Component Value Date/Time   COLORURINE YELLOW 07/11/2024 1404   APPEARANCEUR CLEAR 07/11/2024 1404   LABSPEC 1.008 07/11/2024 1404   PHURINE 7.0 07/11/2024 1404   GLUCOSEU NEGATIVE 07/11/2024 1404   GLUCOSEU NEGATIVE 12/27/2023 1039   HGBUR SMALL (A) 07/11/2024 1404   BILIRUBINUR NEGATIVE 07/11/2024 1404   KETONESUR NEGATIVE 07/11/2024 1404   PROTEINUR 100 (  A) 07/11/2024 1404   UROBILINOGEN 4.0 (A) 12/27/2023 1039   NITRITE NEGATIVE 07/11/2024 1404   LEUKOCYTESUR TRACE (A) 07/11/2024 1404   Sepsis Labs: @LABRCNTIP (procalcitonin:4,lacticidven:4)  ) Recent Results (from the past 240 hours)  Culture, blood (Routine X 2) w Reflex to ID Panel     Status: None   Collection Time: 07/07/24  6:34 AM   Specimen: BLOOD  Result Value Ref Range Status   Specimen Description BLOOD RIGHT ANTECUBITAL  Final   Special Requests   Final    BOTTLES DRAWN AEROBIC ONLY Blood Culture adequate volume   Culture   Final    NO GROWTH 5 DAYS Performed at [facility], [address], [place], [place] [address]    Report Status 07/12/2024 FINAL  Final  Culture, blood (Routine X 2) w Reflex to ID Panel     Status: None   Collection Time: 07/07/24  6:35 AM   Specimen: BLOOD  Result Value Ref Range  Status   Specimen Description BLOOD LEFT ANTECUBITAL  Final   Special Requests   Final    BOTTLES DRAWN AEROBIC ONLY Blood Culture adequate volume   Culture   Final    NO GROWTH 5 DAYS Performed at [facility], [address] [place], [place] [address]    Report Status 07/12/2024 FINAL  Final  Blood culture (routine x 2)     Status: None (Preliminary result)   Collection Time: 07/11/24 12:49 PM   Specimen: BLOOD  Result Value Ref Range Status   Specimen Description BLOOD SITE NOT SPECIFIED  Final   Special Requests   Final    BOTTLES DRAWN AEROBIC AND ANAEROBIC Blood Culture results may not be optimal due to an inadequate volume of blood received in culture bottles   Culture   Final    NO GROWTH 2 DAYS Performed at [facility], [address], [place], [place] [address]    Report Status PENDING  Incomplete  Blood culture (routine x 2)     Status: None (Preliminary result)   Collection Time: 07/11/24 12:52 PM   Specimen: BLOOD  Result Value Ref Range Status   Specimen Description BLOOD SITE NOT SPECIFIED  Final   Special Requests   Final    BOTTLES DRAWN AEROBIC AND ANAEROBIC Blood Culture results may not be optimal due to an inadequate volume of blood received in culture bottles   Culture   Final    NO GROWTH 2 DAYS Performed at [facility], [address] [place], [place] [address]    Report Status PENDING  Incomplete  MRSA Next Gen by PCR, Nasal     Status: Abnormal   Collection Time: 07/11/24  5:42 PM   Specimen: Nasal Mucosa; Nasal Swab  Result Value Ref Range Status   MRSA by PCR Next Gen DETECTED (A) NOT DETECTED Final    Comment: RESULT CALLED TO, READ BACK BY AND VERIFIED WITH: RN JESSICA G [id number] FCP (NOTE) The GeneXpert MRSA Assay (FDA approved for NASAL specimens only), is one component of a comprehensive MRSA colonization surveillance program. It is not intended to diagnose MRSA infection nor to guide or monitor treatment for MRSA  infections. Test performance is not FDA approved in patients less than 58 years old. Performed at [facility], [address], [place], [place] [address]      Radiology Studies: DG Chest Portable 1 View Result Date: 07/11/2024 CLINICAL DATA:  Shortness of breath.  Tachycardia. EXAM: PORTABLE CHEST 1 VIEW COMPARISON:  Radiograph 2 days ago 07/09/2024 FINDINGS: Right-sided intra line tip projects over the SVC. Cardiomegaly is unchanged. Stable mediastinal contours with aortic atherosclerosis. Improving interstitial opacities. No pneumothorax or large pleural effusion. No confluent consolidation. Stable osseous structures. IMPRESSION: 1. Improving interstitial opacities, improving pulmonary edema versus infection. 2. Unchanged cardiomegaly. Electronically Signed   By: Andrea Gasman M.D.   On: 07/11/2024 15:24   CT HEAD WO CONTRAST Result Date: 07/11/2024 EXAM: CT HEAD WITHOUT CONTRAST 07/11/2024 01:07:17 PM TECHNIQUE: CT of the head was performed without the administration of intravenous contrast. Automated exposure control, iterative reconstruction, and/or weight based adjustment of the mA/kV was utilized to reduce the radiation dose to as low as reasonably achievable. COMPARISON: CT head 06/28/2024 CLINICAL HISTORY: AMS. Pt BIB [facility] from [facility], was called due to tachycardia and hypertension. FINDINGS: BRAIN AND VENTRICLES: Within limitations of motion, no acute infarct, intracranial hemorrhage, mass, midline shift, hydrocephalus, or extra-axial fluid collection is identified. Mild cerebral atrophy. Grossly unchanged cerebral white matter hypodensities, nonspecific but compatible with mild chronic small vessel ischemic disease. ORBITS: Left cataract extraction. SINUSES: No acute abnormality. SOFT TISSUES AND SKULL: Calcified atherosclerosis at the skull base. Venous gas in the scalp and infratemporal fossa bilaterally, likely iatrogenic. IMPRESSION: 1. No evidence of an acute intracranial  abnormality on this motion-degraded study. Electronically signed by: Dasie Hamburg MD 07/11/2024 01:21 PM EDT RP Workstation: [id number]     Scheduled Meds:  aspirin  EC  81 mg Oral Daily   diltiazem   60 mg Oral Q12H   enoxaparin  (LOVENOX ) injection  40 mg Subcutaneous Q24H   feeding supplement  237 mL Oral BID BM   furosemide   40 mg Intravenous BID   insulin  aspart  0-5 Units Subcutaneous QHS   insulin  aspart  0-9 Units Subcutaneous TID WC   linaclotide   145 mcg Oral QAC breakfast   megestrol   625 mg Oral Daily   metoprolol  tartrate  50 mg Oral BID   mirtazapine   30 mg Oral QHS   rosuvastatin   40 mg Oral BH-q7a   sodium bicarbonate   650 mg Oral BID   sodium chloride  flush  10-40 mL Intracatheter Q12H   tamsulosin   0.4 mg Oral Daily   Continuous Infusions:  DAPTOmycin  900 mg (07/12/24 1416)     LOS: 1 day    Time spent:    Sigurd Pac, MD [facility]   07/13/2024, 11:22 AM

## 2024-07-14 DIAGNOSIS — I5032 Chronic diastolic (congestive) heart failure: Secondary | ICD-10-CM | POA: Diagnosis not present

## 2024-07-14 DIAGNOSIS — G9340 Encephalopathy, unspecified: Secondary | ICD-10-CM | POA: Diagnosis not present

## 2024-07-14 LAB — BASIC METABOLIC PANEL WITH GFR
Anion gap: 13 (ref 5–15)
BUN: 22 mg/dL (ref 8–23)
CO2: 24 mmol/L (ref 22–32)
Calcium: 8 mg/dL — ABNORMAL LOW (ref 8.9–10.3)
Chloride: 105 mmol/L (ref 98–111)
Creatinine, Ser: 2.09 mg/dL — ABNORMAL HIGH (ref 0.61–1.24)
GFR, Estimated: 32 mL/min — ABNORMAL LOW (ref >60)
Glucose, Bld: 149 mg/dL — ABNORMAL HIGH (ref 70–99)
Potassium: 3.8 mmol/L (ref 3.5–5.1)
Sodium: 142 mmol/L (ref 135–145)

## 2024-07-14 LAB — GLUCOSE, CAPILLARY
Glucose-Capillary: 142 mg/dL — ABNORMAL HIGH (ref 70–99)
Glucose-Capillary: 145 mg/dL — ABNORMAL HIGH (ref 70–99)
Glucose-Capillary: 195 mg/dL — ABNORMAL HIGH (ref 70–99)
Glucose-Capillary: 176 mg/dL — ABNORMAL HIGH (ref 70–99)

## 2024-07-14 LAB — CBC
HCT: 33.6 % — ABNORMAL LOW (ref 39.0–52.0)
Hemoglobin: 10.3 g/dL — ABNORMAL LOW (ref 13.0–17.0)
MCH: 23.5 pg — ABNORMAL LOW (ref 26.0–34.0)
MCHC: 30.7 g/dL (ref 30.0–36.0)
MCV: 76.7 fL — ABNORMAL LOW (ref 80.0–100.0)
Platelets: 244 K/uL (ref 150–400)
RBC: 4.38 MIL/uL (ref 4.22–5.81)
RDW: 24.5 % — ABNORMAL HIGH (ref 11.5–15.5)
WBC: 11 K/uL — ABNORMAL HIGH (ref 4.0–10.5)
nRBC: 0 % (ref 0.0–0.2)

## 2024-07-14 LAB — CK: Total CK: 40 U/L — ABNORMAL LOW (ref 49–397)

## 2024-07-14 MED ORDER — POTASSIUM CHLORIDE CRYS ER 20 MEQ PO TBCR
40.0000 meq | EXTENDED_RELEASE_TABLET | Freq: Once | ORAL | Status: DC
  Filled 2024-07-14: qty 2

## 2024-07-14 MED ORDER — TORSEMIDE 20 MG PO TABS
40.0000 mg | ORAL_TABLET | Freq: Every day | ORAL | Status: DC
  Administered 2024-07-15: 40 mg via ORAL
  Filled 2024-07-14 (×2): qty 2

## 2024-07-14 MED ORDER — CHLORHEXIDINE GLUCONATE CLOTH 2 % EX PADS
6.0000 | MEDICATED_PAD | Freq: Every day | CUTANEOUS | Status: DC
  Administered 2024-07-14 – 2024-07-15 (×2): 6 via TOPICAL

## 2024-07-14 NOTE — TOC Initial Note (Signed)
 Transition of Care [facility]) - Initial/Assessment Note    Patient Details  Name: Adam Harris MRN: [id number] Date of Birth: 1946-04-23  Transition of Care [facility]) CM/SW Contact:    Roxie KANDICE Stain, RN Phone Number: 07/14/2024, 9:53 AM  Clinical Narrative:                 Spoke to patient's son, [name], regarding  home hospice. This RNCM offered choice for Home hospice [name] states he has no preference, RNCM made referral to  Chiquita with [facility] and  She is able to take referral. [name] requested home hospice agency reach out tomorrow am.   Expected Discharge Plan: Home w Hospice Care Barriers to Discharge: Continued Medical Work up   Patient Goals and CMS Choice Patient states their goals for this hospitalization and ongoing recovery are:: son wants patient to go home with home hospice CMS Medicare.gov Compare Post Acute Care list provided to:: Patient Represenative (must comment) Choice offered to / list presented to : Adult Children      Expected Discharge Plan and Services                                              Prior Living Arrangements/Services                       Activities of Daily Living   ADL Screening (condition at time of admission) Independently performs ADLs?: Yes (appropriate for developmental age) Is the patient deaf or have difficulty hearing?: No Does the patient have difficulty seeing, even when wearing glasses/contacts?: No Does the patient have difficulty concentrating, remembering, or making decisions?: No  Permission Sought/Granted                  Emotional Assessment              Admission diagnosis:  Atrial fibrillation [facility]) [I48.91] Patient Active Problem List   Diagnosis Date Noted   Atrial fibrillation (HCC) 07/11/2024   Acute encephalopathy 07/11/2024   History of bacteremia 07/11/2024   Leukocytosis 07/11/2024   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   Palliative care by specialist 07/02/2024    DNR (do not resuscitate) discussion 07/02/2024   Fall 06/28/2024   Sepsis secondary to UTI (HCC) 06/28/2024   Microcytic anemia 06/28/2024   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) 06/27/2024   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) 06/27/2024   Acute delirium 06/27/2024   Pressure injury of left buttock, stage 3 (HCC) 06/19/2024   Moderate protein-calorie malnutrition (HCC) 06/19/2024   Opioid-induced constipation 06/19/2024   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) 06/04/2024   Generalized weakness 05/25/2024   Complicated UTI (urinary tract infection) 05/25/2024   Type 2 diabetes mellitus with hyperglycemia (HCC) 05/25/2024   Nephrostomy present (HCC) 04/01/2024   AKI (acute kidney injury) (HCC) 03/08/2024   Tachyarrhythmia 03/07/2024   Acute blood loss anemia 03/07/2024   Dysuria 01/25/2024   Presence of Watchman left atrial appendage closure device 12/13/2023   Permanent atrial fibrillation (HCC) 11/29/2023   Hx of hematuria 11/29/2023   Lower urinary tract symptoms (LUTS) 04/06/2023   CKD (chronic kidney disease), stage IV (HCC) 03/22/2023   Prostate cancer (HCC) 06/21/2022   Cataract cortical, senile, left 12/13/2021   Diabetic peripheral neuropathy (HCC) 12/13/2021   Hydrocele- Left 12/02/2021   Solitary kidney, acquired 10/26/2021  Hypertensive renal disease 10/26/2021   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   Chronic diastolic CHF (congestive heart failure), NYHA class 2 (HCC) 04/02/2017   Atrial fibrillation with RVR (HCC) 11/20/2016   OA (osteoarthritis) of knee 05/31/2016   Low back pain 05/31/2016   Exertional angina (HCC) 10/29/2014   Chronic kidney disease, stage 3b (HCC) 10/29/2014   Coronary artery disease involving native coronary artery of native heart with angina pectoris (HCC) 03/09/2014   Type 2 diabetes mellitus with cardiac  complication (HCC) 02/17/2014   Hyperlipidemia 02/05/2014   History of non-ST elevation myocardial infarction (NSTEMI) 02/03/2014   Essential hypertension 02/03/2014   Anxiety state 04/19/2013   History of prostate cancer 04/19/2013   Insomnia 04/19/2013   Stiffness of joint, lower leg 04/19/2013   Sciatica 04/19/2013   PCP:  [name], MD Pharmacy:   [facility] [place], [place] - [address] [place] [address] Phone: [phone] Fax: [phone]  [id number] - [address], [place] - [address] AT [facility] OF [address] & [address] [place] [address] Phone: [phone] Fax: [phone]  [id number] - [place], [place] - [address] AT [facility] [address] [place] [address] Phone: [phone] Fax: [phone]     Social Drivers of Health (SDOH) Social History: SDOH Screenings   Food Insecurity: No Food Insecurity (07/11/2024)  Housing: Low Risk  (07/11/2024)  Transportation Needs: No Transportation Needs (07/11/2024)  Utilities: Not At Risk (07/11/2024)  Alcohol  Screen: Low Risk  (08/06/2023)  Depression (PHQ2-9): Low Risk  (06/25/2024)  Financial Resource Strain: Low Risk  (08/06/2023)  Physical Activity: Inactive (08/06/2023)  Social Connections: Moderately Integrated (07/11/2024)  Stress: No Stress Concern Present (08/06/2023)  Tobacco Use: Medium Risk (06/27/2024)  Health Literacy: Adequate Health Literacy (08/06/2023)   SDOH Interventions:     Readmission Risk Interventions    07/02/2024   12:36 PM 05/26/2024    3:33 PM  Readmission Risk Prevention Plan  Transportation Screening Complete Complete  PCP or Specialist Appt within 3-5 Days  Complete  HRI or Home Care Consult  Complete  Social Work Consult for Recovery Care Planning/Counseling  Complete  Palliative Care Screening  Not Applicable  Medication Review Furniture conservator/restorer)  Referral to Pharmacy  PCP or Specialist appointment within 3-5 days of discharge Complete   HRI or Home Care Consult Complete   Palliative Care Screening Complete

## 2024-07-14 NOTE — Progress Notes (Signed)
 Physical Therapy Treatment Patient Details Name: Adam Harris MRN: [id number] DOB: 1946/03/26 Today's Date: 07/14/2024   History of Present Illness Adam Harris is a 78 y.o. male who  presented 8/15 with a fall and confusion after a recent nephrostomy replacement. Admitted for Sepsis secondary to MRSA bacteremia and Enterococcus faecalis infection from UTI. 8/20 TEE. PMHx: hypertension, hyperlipidemia, HFpEF,  atrial fibrillation not on anticoagulation, CAD, diabetes mellitus type 2, metastatic urothelial cancer s/p nephrostomy tube placement, chronic kidney disease stage IV, prostate cancer, fatty liver, and anxiety    PT Comments  Pt sliding down in recliner due to trying to reposition off of sore buttocks. Pt fatigued and assisted back to bed.     If plan is discharge home, recommend the following: A little help with walking and/or transfers;A little help with bathing/dressing/bathroom;Assistance with cooking/housework;Assist for transportation;Help with stairs or ramp for entrance   Can travel by private vehicle     Yes  Equipment Recommendations  Hospital bed;Other (comment) (transport chair)    Recommendations for Other Services       Precautions / Restrictions Precautions Precautions: Fall;Other (comment) Precaution/Restrictions Comments: nephrostomy tube     Mobility  Bed Mobility Overal bed mobility: Needs Assistance Bed Mobility: Sit to Supine     Supine to sit: Min assist, HOB elevated Sit to supine: Min assist   General bed mobility comments: Assist to guide trunk and bring legs up into bed    Transfers Overall transfer level: Needs assistance Equipment used: Rolling walker (2 wheels) Transfers: Sit to/from Stand, Bed to chair/wheelchair/BSC Sit to Stand: Min assist   Step pivot transfers: Min assist       General transfer comment: Assist to power up and steady with chair to bed    Ambulation/Gait            Stairs             Wheelchair  Mobility     Tilt Bed    Modified Rankin (Stroke Patients Only)       Balance Overall balance assessment: Needs assistance Sitting-balance support: No upper extremity supported, Feet supported Sitting balance-Leahy Scale: Fair     Standing balance support: Bilateral upper extremity supported, Reliant on assistive device for balance Standing balance-Leahy Scale: Poor Standing balance comment: UE support and CGA                            Communication Communication Communication: Impaired Factors Affecting Communication: Reduced clarity of speech  Cognition Arousal: Alert Behavior During Therapy: WFL for tasks assessed/performed   PT - Cognitive impairments: No family/caregiver present to determine baseline, Attention, Problem solving, Safety/Judgement                         Following commands: Impaired Following commands impaired: Only follows one step commands consistently    Cueing Cueing Techniques: Verbal cues, Tactile cues  Exercises      General Comments General comments (skin integrity, edema, etc.): VSS on 1L      Pertinent Vitals/Pain Pain Assessment Pain Assessment: Faces Faces Pain Scale: Hurts even more Pain Location: buttocks Pain Descriptors / Indicators: Grimacing Pain Intervention(s): Repositioned    Home Living Family/patient expects to be discharged to:: Private residence Living Arrangements: Spouse/significant other;Children Available Help at Discharge: Family;Available 24 hours/day Type of Home: House Home Access: Stairs to enter   Entrance Stairs-Number of Steps: 1+1 small steps Alternate Level Stairs-Number of Steps:  flight Home Layout: Two level;1/2 bath on main level;Bed/bath upstairs Home Equipment: Grab bars - tub/shower;Cane - single Librarian, academic (2 wheels)      Prior Function            PT Goals (current goals can now be found in the care plan section) Acute Rehab PT Goals PT Goal  Formulation: With patient Time For Goal Achievement: 07/28/24 Potential to Achieve Goals: Fair Progress towards PT goals: Progressing toward goals    Frequency    Min 1X/week      PT Plan      Co-evaluation              AM-PAC PT 6 Clicks Mobility   Outcome Measure  Help needed turning from your back to your side while in a flat bed without using bedrails?: A Little Help needed moving from lying on your back to sitting on the side of a flat bed without using bedrails?: A Little Help needed moving to and from a bed to a chair (including a wheelchair)?: A Little Help needed standing up from a chair using your arms (e.g., wheelchair or bedside chair)?: A Little Help needed to walk in hospital room?: A Little Help needed climbing 3-5 steps with a railing? : A Lot 6 Click Score: 17    End of Session Equipment Utilized During Treatment: Gait belt Activity Tolerance: Patient tolerated treatment well Patient left: with call bell/phone within reach;in bed;with bed alarm set Nurse Communication: Mobility status PT Visit Diagnosis: Unsteadiness on feet (R26.81);Other abnormalities of gait and mobility (R26.89);Muscle weakness (generalized) (M62.81)     Time: 8645-8594 PT Time Calculation (min) (ACUTE ONLY): 11 min  Charges:    $Gait Training: 8-22 mins PT General Charges $$ ACUTE PT VISIT: 1 Visit                     [facility] PT Acute Rehabilitation Services Office [phone]    Rodgers ORN [facility] 07/14/2024, 2:59 PM

## 2024-07-14 NOTE — Progress Notes (Addendum)
 PROGRESS NOTE    Adam Harris  FMW:[id number] DOB: Oct 18, 1946 DOA: 07/11/2024 PCP: [name], MD   78/M chronically ill with metastatic urothelial carcinoma, recent L nephrostomy tube, CAD, type 2 diabetes mellitus, atrial fibrillation not on anticoagulation, diastolic CHF CKD 4, prostate cancer presented to the ED with chest pain and shortness of breath - Recently hospitalized 8/15-28 with sepsis from MRSA bacteremia and Enterococcus faecalis secondary to a complicated urinary tract infection.  Discharged to SNF 8/27 to complete 4 weeks of daptomycin  on 9/15 via IJ catheter. - Patient reports being short of breath for a few days, progressively worsened in rehab yesterday and brought back to the ED, allegedly also had occasional apneic spells given Narcan by EMS. - In the ED tachycardic, tachypneic, A-fib RVR labs noted WBC 12.7, creatinine 2, chest x-ray with interstitial opacities. - Admitted, started on diuretics, oral diltiazem   Subjective: - Feels better overall, breathing is improving  Assessment and Plan:  Acute on chronic diastolic CHF Severe pulmonary hypertension - Last echo 8/25 with EF 65-70%, moderate LVH, indeterminate diastolic function, RV normal, severely elevated PASP RVSP 79, Watchman device noted  - Improving with diuresis, 4.6 L negative, transition to oral torsemide  - GDMT limited by CKD - Increase activity, PT OT eval - Overall poor prognosis with metastatic cancer and multiple medical problems, seen by palliative care last admission, son considering Hospice at [place], [facility] consult  Permanent atrial fibrillation with RVR -Continue with metoprolol  50 mg BID, continue diltiazem  - Has a watchman's device, not on anticoagulation   Acute encephalopathy - Secondary to polypharmacy, recent infection, awake and alert, oriented X2 this morning, monitor   Leukocytosis -UA and x-ray appear unremarkable, has a recent percutaneous nephrostomy and new central line - Improving,  blood cultures negative so far   Complicated bacteremia, MRSA and E faecalis -Secondary to pyelonephritis, recent nephrostomy tube -Seen by ID last admission, recommended daptomycin  for 4 weeks to end on 9/15, felt to potentially need suppression thereafter with oral ABX since [facility] retained  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  During hospital stay patient was evaluated by urology who felt nephrostomy tube did not need to be replaced at that time. -Per urology, he is felt to need long-term percutaneous nephrostomy tube   Uncontrolled diabetes mellitus type 2, without long-term use of insulin  Last hemoglobin A1c noted to be 8.3 when checked on 7/7. Stable, SSI for now   Microcytic anemia -Stable   Chronic kidney disease stage IV Creatinine appears at baseline.   Subclinical hypothyroidism TSH- 5.579 with free T4- 0.79  Severe malnutrition Albumin  was 2.1  Goals, this is a chronically ill debilitated male with metastatic urothelial carcinoma, recent complicated bacteremia, CHF, severe pulmonary hypertension, CKD 4, A-fib, diabetes, malnutrition with ongoing failure to thrive, overall prognosis is poor, seen by palliative care last admission, continue goals of care conversations   DVT prophylaxis: Lovenox  Advance Care Planning: DNR Family Communication: No family at bedside called and updated [name] yesterday Disposition Plan: Possibly home with hospice  Consultants:    Procedures:   Antimicrobials: Daptomycin    Objective: Vitals:   07/13/24 2352 07/14/24 0533 07/14/24 0817 07/14/24 0828  BP: 102/76 100/74 105/67 105/67  Pulse: 79 73 81 77  Resp: 19 19 20    Temp: 97.9 F (36.6 C) 98.1 F (36.7 C) 97.9 F (36.6 C)   TempSrc: Oral Oral Oral   SpO2: 93% 95%  95%   Weight:  78.4 kg    Height:        Intake/Output Summary (Last 24 hours)  at 07/14/2024 0932 Last data filed at 07/14/2024 0533 Gross per 24 hour  Intake 564 ml  Output 1725 ml  Net -1161 ml   Filed Weights   07/12/24 1746 07/13/24 0409 07/14/24 0533  Weight: 80 kg 80.3 kg 78.4 kg    Examination:  General exam: Chronically ill elderly male laying in bed, AO x 2 HEENT: no JVD, tunneled right IJ catheter, positive JVD Respiratory system: Improving air movement Cardiovascular system: S1 & S2 heard, regular rhythm Abd: nondistended, soft and nontender.Normal bowel sounds heard.,  Left percutaneous nephrostomy Extremities: Trace edema Skin: No rashes Psychiatry: Flat affect    Data Reviewed:   CBC: Recent Labs  Lab 07/09/24 0045 07/11/24 1233 07/12/24 0500 07/13/24 0510 07/14/24 0500  WBC 5.5 12.7* 15.2* 12.9* 11.0*  NEUTROABS 2.0 9.5*  --   --   --   HGB 9.9* 11.6* 10.7* 10.2* 10.3*  HCT 32.5* 37.8* 34.6* 33.1* 33.6*  MCV 78.1* 77.3* 76.2* 76.3* 76.7*  PLT 278 309 322 245 244   Basic Metabolic Panel: Recent Labs  Lab 07/10/24 0829 07/11/24 1233 07/12/24 0500 07/13/24 0510 07/14/24 0500  NA 138 138 140 139 142  K 3.1* 4.2 3.6 3.7 3.8  CL 108 104 105 106 105  CO2 21* 20* 23 22 24   GLUCOSE 205* 182* 118* 138* 149*  BUN 20 21 23 21 22   CREATININE 1.62* 2.05* 1.93* 2.13* 2.09*  CALCIUM  8.0* 8.4* 7.9* 7.7* 8.0*  MG  --   --  1.6*  --   --    GFR: Estimated Creatinine Clearance: 31.5 mL/min (A) (by C-G formula based on SCr of 2.09 mg/dL (H)). Liver Function Tests: Recent Labs  Lab 07/11/24 1233  AST 33  ALT 26  ALKPHOS 118  BILITOT 3.4*  PROT 7.4  ALBUMIN  2.1*   No results for input(s): LIPASE, AMYLASE in the last 168 hours. Recent Labs  Lab 07/11/24 1233  AMMONIA 29   Coagulation Profile: No results for input(s): INR, PROTIME in the last 168 hours. Cardiac Enzymes: Recent Labs  Lab 07/14/24 0500  CKTOTAL 40*   BNP (last 3 results) No results for input(s): PROBNP in the last 8760 hours. HbA1C: No results  for input(s): HGBA1C in the last 72 hours. CBG: Recent Labs  Lab 07/13/24 0608 07/13/24 1133 07/13/24 1623 07/13/24 2135 07/14/24 0607  GLUCAP 138* 161* 176* 201* 142*   Lipid Profile: No results for input(s): CHOL, HDL, LDLCALC, TRIG, CHOLHDL, LDLDIRECT in the last 72 hours. Thyroid  Function Tests: Recent Labs    07/11/24 1233  TSH 5.579*  FREET4 0.79   Anemia Panel: No results for input(s): VITAMINB12, FOLATE, FERRITIN, TIBC, IRON, RETICCTPCT in the last 72 hours. Urine analysis:    Component Value Date/Time   COLORURINE YELLOW 07/11/2024 1404   APPEARANCEUR CLEAR 07/11/2024 1404   LABSPEC 1.008 07/11/2024 1404   PHURINE 7.0 07/11/2024 1404   GLUCOSEU NEGATIVE 07/11/2024 1404   GLUCOSEU NEGATIVE 12/27/2023 1039   HGBUR SMALL (A) 07/11/2024 1404   BILIRUBINUR NEGATIVE 07/11/2024 1404   KETONESUR NEGATIVE 07/11/2024 1404   PROTEINUR 100 (A) 07/11/2024 1404   UROBILINOGEN 4.0 (A) 12/27/2023 1039   NITRITE NEGATIVE 07/11/2024 1404   LEUKOCYTESUR TRACE (A) 07/11/2024 1404   Sepsis Labs: @LABRCNTIP (procalcitonin:4,lacticidven:4)  ) Recent Results (from the past 240 hours)  Culture, blood (Routine X 2) w Reflex to  ID Panel     Status: None   Collection Time: 07/07/24  6:34 AM   Specimen: BLOOD  Result Value Ref Range Status   Specimen Description BLOOD RIGHT ANTECUBITAL  Final   Special Requests   Final    BOTTLES DRAWN AEROBIC ONLY Blood Culture adequate volume   Culture   Final    NO GROWTH 5 DAYS Performed at [facility], [address] [place], [place] [address]    Report Status 07/12/2024 FINAL  Final  Culture, blood (Routine X 2) w Reflex to ID Panel     Status: None   Collection Time: 07/07/24  6:35 AM   Specimen: BLOOD  Result Value Ref Range Status   Specimen Description BLOOD LEFT ANTECUBITAL  Final   Special Requests   Final    BOTTLES DRAWN AEROBIC ONLY Blood Culture adequate volume   Culture   Final    NO GROWTH  5 DAYS Performed at [facility], [address], [place], [place] [address]    Report Status 07/12/2024 FINAL  Final  Blood culture (routine x 2)     Status: None (Preliminary result)   Collection Time: 07/11/24 12:49 PM   Specimen: BLOOD  Result Value Ref Range Status   Specimen Description BLOOD SITE NOT SPECIFIED  Final   Special Requests   Final    BOTTLES DRAWN AEROBIC AND ANAEROBIC Blood Culture results may not be optimal due to an inadequate volume of blood received in culture bottles   Culture   Final    NO GROWTH 3 DAYS Performed at [facility], [address] [place], [place] [address]    Report Status PENDING  Incomplete  Blood culture (routine x 2)     Status: None (Preliminary result)   Collection Time: 07/11/24 12:52 PM   Specimen: BLOOD  Result Value Ref Range Status   Specimen Description BLOOD SITE NOT SPECIFIED  Final   Special Requests   Final    BOTTLES DRAWN AEROBIC AND ANAEROBIC Blood Culture results may not be optimal due to an inadequate volume of blood received in culture bottles   Culture   Final    NO GROWTH 3 DAYS Performed at [facility], [address] [place], [place] [address]    Report Status PENDING  Incomplete  MRSA Next Gen by PCR, Nasal     Status: Abnormal   Collection Time: 07/11/24  5:42 PM   Specimen: Nasal Mucosa; Nasal Swab  Result Value Ref Range Status   MRSA by PCR Next Gen DETECTED (A) NOT DETECTED Final    Comment: RESULT CALLED TO, READ BACK BY AND VERIFIED WITH: RN JESSICA G [id number] FCP (NOTE) The GeneXpert MRSA Assay (FDA approved for NASAL specimens only), is one component of a comprehensive MRSA colonization surveillance program. It is not intended to diagnose MRSA infection nor to guide or monitor treatment for MRSA infections. Test performance is not FDA approved in patients less than 51 years old. Performed at [facility], [address], [place], [place] [address]      Radiology  Studies: No results found.    Scheduled Meds:  aspirin  EC  81 mg Oral Daily   Chlorhexidine  Gluconate Cloth  6 each Topical Q0600   diltiazem   60 mg Oral Q12H   enoxaparin  (LOVENOX ) injection  40 mg Subcutaneous Q24H   feeding supplement  237 mL Oral BID BM   insulin  aspart  0-5 Units  Subcutaneous QHS   insulin  aspart  0-9 Units Subcutaneous TID WC   linaclotide   145 mcg Oral QAC breakfast   megestrol   625 mg Oral Daily   metoprolol  tartrate  50 mg Oral BID   mirtazapine   30 mg Oral QHS   potassium chloride   40 mEq Oral Once   rosuvastatin   40 mg Oral BH-[id number]   sodium bicarbonate   650 mg Oral BID   sodium chloride  flush  10-40 mL Intracatheter Q12H   tamsulosin   0.4 mg Oral Daily   torsemide   40 mg Oral Daily   Continuous Infusions:  DAPTOmycin        LOS: 2 days    Time spent:    Sigurd Pac, MD [facility]   07/14/2024, 9:32 AM

## 2024-07-14 NOTE — Progress Notes (Signed)
 Occupational Therapy Discharge Patient Details Name: Adam Harris MRN: [id number] DOB: 04/13/1946 Today's Date: 07/14/2024 Time:  -     Patient discharged from OT services secondary to screen by PT [facility] and discussed. Pt is home with hospice care and family support.  Please see latest therapy progress note for current level of functioning and progress toward goals.    Progress and discharge plan discussed with patient and/or caregiver: Patient unable to participate in discharge planning and no caregivers available  GO     Ely Molt 07/14/2024, 1:14 PM

## 2024-07-14 NOTE — Progress Notes (Signed)
 Initial Nutrition Assessment  DOCUMENTATION CODES:   Not applicable -due to inability to confirm muscle and fat depletions via NFPE, as well as nutrition history, patient does not currently meet criteria for malnutrition. Of note, he is likely malnourished as evidenced by his recent signifciant/rapid weight loss and likely undernutrition based off cancer center RD note. Will re-assess at next bedside meeting.  INTERVENTION:  Add Ensure Plus High Protein po BID, each supplement provides 350 kcal and 20 grams of protein Add Magic cup TID with meals, each supplement provides 290 kcal and 9 grams of protein Monitor GOC discussions and modify plan of care as indicated Add MVI w/ minerals Liberalize diet for comfort and to augment intake  NUTRITION DIAGNOSIS:  Increased nutrient needs related to wound healing, cancer and cancer related treatments as evidenced by estimated needs.  GOAL:  Patient will meet greater than or equal to 90% of their needs  MONITOR:  PO intake, Supplement acceptance, Weight trends, Skin  REASON FOR ASSESSMENT:  Consult Assessment of nutrition requirement/status  ASSESSMENT:   Pt with PMH significant for: metastatic urothelieal carcinoma, recent left nephrostomy tube placement, CAD, T2DM, afib, CKD IV, diastolic CHF, and prostate cancer. Recent admission from 8/15-8/28 with sepsis from MRSA bacteremia and Enterococcus faecalis 2/2 complicated UTI. Presented back to ED from SNF for SOB and occasional apneic spells. Admitted for acute on chronic diastolic CHF and severe pulmonary hypertension.  Patient will likely discharge home with hospice. Discussions ongoing. Noted with some confusion this morning thought to be secondary to polypharmacy and recent infection.   Average Meal Intake 8/29: 10% x1 documented meal 8/30: 0-50% x3 documented meals 8/31: 0-60% x2 documented meals  Intake, overall, inadequate based on documented intake reviewed. Will liberalize diet to  augment intake, however expected as part of natural disease progression. Per chart review and cancer center RD note, appears patient does not prefer [employer] and a typical day of intake consists of grits for breakfast and a [employer] burger with onion rings for supper. Unable to glean how much of these food items patient is consuming.   Family documented as engaged in his nutrition status as wife is trying to add butter and sausage to his grits and daughter has purchased Serious Mass protein powder to aid in weight gain. Reason for consult, even, is patient's family requesting supplements be added to augment intake.   Admit Weight: 80.3 kg Current Weight: 78.4 kg  Unable to obtain patient reported UBW. Per chart review, has shown 12.3% weight loss in last two months, which is considered clinically significant for the time frame reviewed. Some non-pitting edema to BLEs documented.  Skin integrity not intact in buttocks area, however not staged.  Drains/Lines: Tunneled CVC; double lumen, right internal jugular (placed 8/22) Left nephrostomy tube (10.2 Fr) placed 4/25 UOP: 1825 ml + 1 unmeasured occurrence x24 hours  Remains on two appetite stimulants. Wife had previously reported some slight improvement in intake to the cancer center RD. Noted to have missed a couple of medications this morning d/t lethargy. Remains on IV ABX and potassium repletion ongoing. Diuretic changed to potassium sparing today. WBC high, but trending down.   Meds: SSI Novolog  0-5 QHS, SSI Novolog  0-9 TID, [name] , mirtazapine , KCl, sodium bicarbonate , torsemide , IV ABX  Labs: Na+ 142 (wdl) K+ 3.1--->3.8 (wdl) WBC 15.2>12.9>11.0 (H) CBGs 138-149 x24 hours A1c 8.3 (05/2024)  NUTRITION - FOCUSED PHYSICAL EXAM: RD working remotely and unable to complete NFPE at this time. Will defer to in-person  meeting. Pt is likely malnourished, to some degree, based off recent weight trend and reported intake. Given  his estimated calorie and protein needs are so elevated, even if consuming full meals at baseline, would likely not be meeting needs consistently or sufficiently.   Diet Order:   Diet Order             Diet heart healthy/carb modified Room service appropriate? Yes; Fluid consistency: Thin  Diet effective now            EDUCATION NEEDS:  No education needs have been identified at this time  Skin:  Skin Assessment: Reviewed RN Assessment  Last BM:  8/31 - type 6 x1  Height:  Ht Readings from Last 1 Encounters:  07/11/24 5' 11 (1.803 m)   Weight:  Wt Readings from Last 1 Encounters:  07/14/24 78.4 kg   Ideal Body Weight:  78.2 kg  BMI:  Body mass index is 24.11 kg/m.  Estimated Nutritional Needs:   Kcal:  2200-2400 kcal  Protein:  110-130g  Fluid:  >2L/day  Blair Deaner MS, RD, LDN Registered Dietitian Clinical Nutrition RD Inpatient Contact Info in Amion

## 2024-07-14 NOTE — Plan of Care (Signed)
  Problem: Clinical Measurements: Goal: Diagnostic test results will improve Outcome: Progressing Goal: Respiratory complications will improve Outcome: Progressing Goal: Cardiovascular complication will be avoided Outcome: Progressing   Problem: Nutrition: Goal: Adequate nutrition will be maintained Outcome: Progressing   Problem: Coping: Goal: Level of anxiety will decrease Outcome: Progressing   Problem: Elimination: Goal: Will not experience complications related to bowel motility Outcome: Progressing Goal: Will not experience complications related to urinary retention Outcome: Progressing   Problem: Pain Managment: Goal: General experience of comfort will improve and/or be controlled Outcome: Progressing   Problem: Safety: Goal: Ability to remain free from injury will improve Outcome: Progressing

## 2024-07-14 NOTE — Evaluation (Signed)
 Physical Therapy Evaluation Patient Details Name: Adam Harris MRN: [id number] DOB: 17-Aug-1946 Today's Date: 07/14/2024  History of Present Illness  Adam Harris is a 78 y.o. male who  presented 8/15 with a fall and confusion after a recent nephrostomy replacement. Admitted for Sepsis secondary to MRSA bacteremia and Enterococcus faecalis infection from UTI. 8/20 TEE. PMHx: hypertension, hyperlipidemia, HFpEF,  atrial fibrillation not on anticoagulation, CAD, diabetes mellitus type 2, metastatic urothelial cancer s/p nephrostomy tube placement, chronic kidney disease stage IV, prostate cancer, fatty liver, and anxiety  Clinical Impression  Pt presents to PT requiring assist with mobility due to weakness, decr balance, and decr activity tolerance. Pt is to go home with hospice and family. Will currently follow to maximize his mobility to decr burden of care for caregivers. Will also ask mobility team to follow.        If plan is discharge home, recommend the following: A little help with walking and/or transfers;A little help with bathing/dressing/bathroom;Assistance with cooking/housework;Assist for transportation;Help with stairs or ramp for entrance   Can travel by private vehicle   Yes    Equipment Recommendations Hospital bed;Other (comment) (transport chair)  Recommendations for Other Services       Functional Status Assessment Patient has had a recent decline in their functional status and/or demonstrates limited ability to make significant improvements in function in a reasonable and predictable amount of time     Precautions / Restrictions Precautions Precautions: Fall      Mobility  Bed Mobility Overal bed mobility: Needs Assistance Bed Mobility: Supine to Sit     Supine to sit: Min assist, HOB elevated     General bed mobility comments: Assist to elevate trunk into sitting    Transfers Overall transfer level: Needs assistance Equipment used: Rolling walker (2  wheels) Transfers: Sit to/from Stand Sit to Stand: Min assist           General transfer comment: Assist to power up    Ambulation/Gait Ambulation/Gait assistance: Contact guard assist Gait Distance (Feet): 25 Feet Assistive device: Rolling walker (2 wheels) Gait Pattern/deviations: Step-through pattern, Decreased step length - right, Decreased step length - left Gait velocity: decr Gait velocity interpretation: <1.31 ft/sec, indicative of household ambulator   General Gait Details: Assist for safety/lines  Stairs            Wheelchair Mobility     Tilt Bed    Modified Rankin (Stroke Patients Only)       Balance Overall balance assessment: Needs assistance Sitting-balance support: No upper extremity supported, Feet supported Sitting balance-Leahy Scale: Fair     Standing balance support: Bilateral upper extremity supported, Reliant on assistive device for balance Standing balance-Leahy Scale: Poor Standing balance comment: UE support and CGA                             Pertinent Vitals/Pain Pain Assessment Pain Assessment: Faces Faces Pain Scale: Hurts little more Pain Location: buttocks Pain Descriptors / Indicators: Grimacing Pain Intervention(s): Repositioned    Home Living Family/patient expects to be discharged to:: Private residence Living Arrangements: Spouse/significant other;Children Available Help at Discharge: Family;Available 24 hours/day Type of Home: House Home Access: Stairs to enter   Entrance Stairs-Number of Steps: 1+1 small steps Alternate Level Stairs-Number of Steps: flight Home Layout: Two level;1/2 bath on main level;Bed/bath upstairs Home Equipment: Grab bars - tub/shower;Cane - single Librarian, academic (2 wheels)      Prior Function Prior Level  of Function : Patient poor historian/Family not available;Needs assist;History of Falls (last six months)             Mobility Comments: Amb with assist with  rolling walker on last admission       Extremity/Trunk Assessment   Upper Extremity Assessment Upper Extremity Assessment: Generalized weakness    Lower Extremity Assessment Lower Extremity Assessment: Generalized weakness       Communication   Communication Communication: Impaired Factors Affecting Communication: Reduced clarity of speech    Cognition Arousal: Alert Behavior During Therapy: WFL for tasks assessed/performed   PT - Cognitive impairments: No family/caregiver present to determine baseline, Attention, Problem solving, Safety/Judgement                         Following commands: Impaired Following commands impaired: Only follows one step commands consistently     Cueing Cueing Techniques: Verbal cues, Tactile cues     General Comments General comments (skin integrity, edema, etc.): SpO2 98% on 1L. Removed O2 and ? decr to 87% on RA with activity but inconsistent pleth. Replaced O2    Exercises     Assessment/Plan    PT Assessment Patient needs continued PT services  PT Problem List Decreased strength;Decreased balance;Decreased mobility       PT Treatment Interventions DME instruction;Gait training;Functional mobility training;Therapeutic activities;Therapeutic exercise;Balance training;Patient/family education    PT Goals (Current goals can be found in the Care Plan section)  Acute Rehab PT Goals PT Goal Formulation: With patient Time For Goal Achievement: 07/28/24 Potential to Achieve Goals: Fair    Frequency Min 1X/week     Co-evaluation               AM-PAC PT 6 Clicks Mobility  Outcome Measure Help needed turning from your back to your side while in a flat bed without using bedrails?: A Little Help needed moving from lying on your back to sitting on the side of a flat bed without using bedrails?: A Little Help needed moving to and from a bed to a chair (including a wheelchair)?: A Little Help needed standing up from a  chair using your arms (e.g., wheelchair or bedside chair)?: A Little Help needed to walk in hospital room?: A Little Help needed climbing 3-5 steps with a railing? : A Lot 6 Click Score: 17    End of Session Equipment Utilized During Treatment: Gait belt Activity Tolerance: Patient tolerated treatment well Patient left: in chair;with call bell/phone within reach;with chair alarm set Nurse Communication: Mobility status PT Visit Diagnosis: Unsteadiness on feet (R26.81);Other abnormalities of gait and mobility (R26.89);Muscle weakness (generalized) (M62.81)    Time: 8850-8777 PT Time Calculation (min) (ACUTE ONLY): 33 min   Charges:   PT Evaluation $PT Eval Moderate Complexity: 1 Mod PT Treatments $Gait Training: 8-22 mins PT General Charges $$ ACUTE PT VISIT: 1 Visit         [facility] PT Acute Rehabilitation Services Office [phone]   Rodgers ORN [facility] 07/14/2024, 2:49 PM

## 2024-07-15 ENCOUNTER — Other Ambulatory Visit (HOSPITAL_COMMUNITY): Payer: Self-pay

## 2024-07-15 ENCOUNTER — Inpatient Hospital Stay (HOSPITAL_COMMUNITY)

## 2024-07-15 DIAGNOSIS — I509 Heart failure, unspecified: Secondary | ICD-10-CM | POA: Diagnosis not present

## 2024-07-15 DIAGNOSIS — I4891 Unspecified atrial fibrillation: Secondary | ICD-10-CM | POA: Diagnosis not present

## 2024-07-15 LAB — COMPREHENSIVE METABOLIC PANEL WITH GFR
ALT: 27 U/L (ref 0–44)
AST: 40 U/L (ref 15–41)
Albumin: 1.7 g/dL — ABNORMAL LOW (ref 3.5–5.0)
Alkaline Phosphatase: 127 U/L — ABNORMAL HIGH (ref 38–126)
Anion gap: 9 (ref 5–15)
BUN: 20 mg/dL (ref 8–23)
CO2: 25 mmol/L (ref 22–32)
Calcium: 7.8 mg/dL — ABNORMAL LOW (ref 8.9–10.3)
Chloride: 104 mmol/L (ref 98–111)
Creatinine, Ser: 2.21 mg/dL — ABNORMAL HIGH (ref 0.61–1.24)
GFR, Estimated: 30 mL/min — ABNORMAL LOW (ref >60)
Glucose, Bld: 164 mg/dL — ABNORMAL HIGH (ref 70–99)
Potassium: 3.4 mmol/L — ABNORMAL LOW (ref 3.5–5.1)
Sodium: 138 mmol/L (ref 135–145)
Total Bilirubin: 1.5 mg/dL — ABNORMAL HIGH (ref 0.0–1.2)
Total Protein: 6.4 g/dL — ABNORMAL LOW (ref 6.5–8.1)

## 2024-07-15 LAB — MAGNESIUM: Magnesium: 2 mg/dL (ref 1.7–2.4)

## 2024-07-15 LAB — GLUCOSE, CAPILLARY
Glucose-Capillary: 170 mg/dL — ABNORMAL HIGH (ref 70–99)
Glucose-Capillary: 133 mg/dL — ABNORMAL HIGH (ref 70–99)
Glucose-Capillary: 188 mg/dL — ABNORMAL HIGH (ref 70–99)

## 2024-07-15 LAB — PATHOLOGIST SMEAR REVIEW

## 2024-07-15 MED ORDER — DILTIAZEM HCL ER 60 MG PO CP12
60.0000 mg | ORAL_CAPSULE | Freq: Two times a day (BID) | ORAL | 0 refills | Status: DC
  Filled 2024-07-15: qty 60, 30d supply, fill #0

## 2024-07-15 MED ORDER — CHLORHEXIDINE GLUCONATE CLOTH 2 % EX PADS: 6.0000 | MEDICATED_PAD | Freq: Every day | CUTANEOUS | Status: DC

## 2024-07-15 MED ORDER — MUPIROCIN 2 % EX OINT
1.0000 | TOPICAL_OINTMENT | Freq: Two times a day (BID) | CUTANEOUS | Status: DC
  Administered 2024-07-15: 1 via NASAL
  Filled 2024-07-15: qty 22

## 2024-07-15 MED ORDER — POTASSIUM CHLORIDE CRYS ER 20 MEQ PO TBCR
40.0000 meq | EXTENDED_RELEASE_TABLET | Freq: Once | ORAL | Status: AC
  Administered 2024-07-15: 40 meq via ORAL
  Filled 2024-07-15: qty 2

## 2024-07-15 MED ORDER — TORSEMIDE 20 MG PO TABS
40.0000 mg | ORAL_TABLET | Freq: Every day | ORAL | 0 refills | Status: DC
  Filled 2024-07-15: qty 60, 30d supply, fill #0

## 2024-07-15 MED ORDER — DOXYCYCLINE HYCLATE 100 MG PO TABS
100.0000 mg | ORAL_TABLET | Freq: Two times a day (BID) | ORAL | 0 refills | Status: AC
  Filled 2024-07-15: qty 28, 14d supply, fill #0

## 2024-07-15 NOTE — Telephone Encounter (Signed)
 The patient has been hospitalized since 06/27/2024 due to urosepsis. He will be discharged home today with Hospice.   He is no longer on Plavix .

## 2024-07-15 NOTE — TOC Progression Note (Addendum)
 Transition of Care (TOC) - Progression Note   Spoke to [employer] with [facility]. She has ordered hospital bed and over the bed table for patient for home.   NCM called son Viviano Bir . Justin aware discharge is today and requesting ambulance transport home.   Family has heard from DME company they are on the way to home now for delivery.   [name] asking for bedside commode. NCM notified Chiquita she will have one delivered within 4 hours.   Once DME in place [name] will call NCM to arrange ambulance.   NCM confirmed address with [name].   NCM secure chatted MD for DNR form   1155 Nurse messaged that patient will discharge on IV ABX . Chiquita and Dr Orlando discussed and ABX will be changed to PO. NCM called Justion and made him aware. [name] has decided to provide transportation at discharge, and asked if nurse can call him with a time. NCM secure chatted team    Patient Details  Name: Adam Harris MRN: [id number] Date of Birth: Oct 31, 1946  Transition of Care [facility]) CM/SW Contact  Jaheim Canino, Powell Jansky, RN Phone Number: 07/15/2024, 10:38 AM  Clinical Narrative:       Expected Discharge Plan: Home w Hospice Care Barriers to Discharge: Continued Medical Work up               Expected Discharge Plan and Services         Expected Discharge Date: 07/15/24                                     Social Drivers of Health (SDOH) Interventions SDOH Screenings   Food Insecurity: No Food Insecurity (07/11/2024)  Housing: Low Risk  (07/11/2024)  Transportation Needs: No Transportation Needs (07/11/2024)  Utilities: Not At Risk (07/11/2024)  Alcohol  Screen: Low Risk  (08/06/2023)  Depression (PHQ2-9): Low Risk  (06/25/2024)  Financial Resource Strain: Low Risk  (08/06/2023)  Physical Activity: Inactive (08/06/2023)  Social Connections: Moderately Integrated (07/11/2024)  Stress: No Stress Concern Present (08/06/2023)  Tobacco Use: Medium Risk (06/27/2024)  Health Literacy:  Adequate Health Literacy (08/06/2023)    Readmission Risk Interventions    07/02/2024   12:36 PM 05/26/2024    3:33 PM  Readmission Risk Prevention Plan  Transportation Screening Complete Complete  PCP or Specialist Appt within 3-5 Days  Complete  HRI or Home Care Consult  Complete  Social Work Consult for Recovery Care Planning/Counseling  Complete  Palliative Care Screening  Not Applicable  Medication Review Oceanographer)  Referral to Pharmacy  PCP or Specialist appointment within 3-5 days of discharge Complete   HRI or Home Care Consult Complete   Palliative Care Screening Complete

## 2024-07-15 NOTE — Care Management Important Message (Signed)
 Important Message  Patient Details  Name: Adam Harris MRN: [id number] Date of Birth: Aug 20, 1946   Important Message Given:  Yes - Medicare IM   Patient left prior to Im delivery will mail the copy to the patient home address.   Edlin Ford 07/15/2024, 3:43 PM

## 2024-07-15 NOTE — Discharge Summary (Addendum)
 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   Essential hypertension   Type 2 diabetes mellitus with hyperglycemia (HCC)   Microcytic anemia   Chronic kidney disease, stage 3b (HCC)   Acute on chronic congestive heart failure [facility])   Discharge Condition: Improved, guarded prognosis  Diet recommendation: Diet, heart healthy  Filed Weights   07/13/24 0409 07/14/24 0533 07/15/24 0641  Weight: 80.3 kg 78.4 kg 78.9 kg    History of present illness:  78/M chronically ill with metastatic urothelial carcinoma, recent L nephrostomy tube, CAD, type 2 diabetes mellitus, atrial fibrillation not on anticoagulation, diastolic CHF CKD 4, prostate cancer presented to the ED with chest pain and shortness of breath - Recently hospitalized 8/15-28 with sepsis from MRSA bacteremia and Enterococcus faecalis secondary to a complicated urinary tract infection.  Discharged to SNF 8/27 to complete 4 weeks of daptomycin  on 9/15 via IJ catheter. - Patient reports being short of breath for a few days, progressively worsened in rehab yesterday and brought back to the ED, allegedly also had occasional apneic spells given Narcan by EMS. - In the ED tachycardic, tachypneic, A-fib RVR labs noted WBC 12.7, creatinine 2, chest x-ray with interstitial opacities. - Admitted, started on diuretics, oral diltiazem   Hospital Course:  Acute on chronic diastolic  CHF Severe pulmonary hypertension - Last echo 8/25 with EF 65-70%, moderate LVH, indeterminate diastolic function, RV normal, severely elevated PASP RVSP 79, Watchman device noted  - Improving with diuresis, 4.6 L negative, transition to oral torsemide  - GDMT limited by CKD - Increase activity, PT OT eval - Overall poor prognosis with metastatic cancer and multiple medical problems, seen by palliative care last admission/last week, discussed poor prognosis with son, plan for hospice services at discharge.   Permanent atrial fibrillation with RVR -Continue with metoprolol  50 mg BID, continue diltiazem  - Has a watchman's device, not on anticoagulation   Acute encephalopathy - Secondary to polypharmacy, recent infection, awake and alert, oriented X2 this morning, monitor   Leukocytosis -UA and x-ray appear unremarkable, has a recent percutaneous nephrostomy and new central line - Improving, blood cultures negative so far   Complicated bacteremia, MRSA and E faecalis -Secondary to pyelonephritis, recent nephrostomy tube -Seen by ID last admission, recommended daptomycin  for 4 weeks to end on 9/15, -Now plan for discharge home with hospice given significant disease burden -Doxycycline  started instead of IV daptomycin  for 2 weeks - Home hospice set up   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  During hospital stay patient was evaluated by urology who felt nephrostomy tube did not need to be replaced at that time. -Per urology, he is felt to need long-term percutaneous nephrostomy tube   Uncontrolled diabetes mellitus type 2, without long-term use of insulin  Last hemoglobin A1c noted to be 8.3 when checked on 7/7. -Now stable   Microcytic anemia -Stable   Chronic kidney disease stage IV Creatinine appears at baseline.  Subclinical hypothyroidism TSH- 5.579  with free T4- 0.79   Severe malnutrition Albumin  was 2.1   Goals, this is a chronically ill debilitated male with metastatic urothelial carcinoma, recent complicated bacteremia, CHF, severe pulmonary hypertension, CKD 4, A-fib, diabetes, malnutrition with ongoing failure to thrive, overall prognosis is poor, seen by palliative care last admission, discussed with son about poor prognosis, he is planning to set up home hospice at discharge    Advance Care Planning: DNR Family Communication: No family at bedside called and updated [name]   Discharge Exam: Vitals:   07/15/24 0714 07/15/24 1133  BP: (!) 124/91 131/70  Pulse: 83 87  Resp: (!) 25 (!) 21  Temp: 97.8 F (36.6 C) 97.9 F (36.6 C)  SpO2: 96% 96%     General exam: Chronically ill elderly male laying in bed, AO x 2 HEENT: no JVD, tunneled right IJ catheter, positive JVD Respiratory system: Improving air movement Cardiovascular system: S1 & S2 heard, regular rhythm Abd: nondistended, soft and nontender.Normal bowel sounds heard.,  Left percutaneous nephrostomy Extremities: Trace edema Skin: No rashes Psychiatry: Flat affect  Discharge Instructions   Discharge Instructions     Diet - low sodium heart healthy   Complete by: As directed    Discharge wound care:   Complete by: As directed    routine   Increase activity slowly   Complete by: As directed       Allergies as of 07/15/2024       Reactions   Xarelto  [rivaroxaban ] Other (See Comments)   -Skin Blisters         Medication List     STOP taking these medications    daptomycin  IVPB Commonly known as: CUBICIN    furosemide  40 MG tablet Commonly known as: LASIX    isosorbide  mononitrate 30 MG 24 hr tablet Commonly known as: IMDUR        TAKE these medications    aspirin  EC 81 MG tablet Take 1 tablet (81 mg total) by mouth daily. Swallow whole.   Carboxymethylcell-Glycerin  PF 0.5-0.9 % Soln Place 2 drops into both eyes 4 (four) times  daily.   diltiazem  60 MG 12 hr capsule Commonly known as: CARDIZEM  SR Take 1 capsule (60 mg total) by mouth every 12 (twelve) hours.   doxycycline  100 MG tablet Commonly known as: VIBRA -TABS Take 1 tablet (100 mg total) by mouth 2 (two) times daily for 14 days.   hydrOXYzine  25 MG capsule Commonly known as: VISTARIL  Take 1 capsule (25 mg total) by mouth every 8 (eight) hours as needed. What changed: reasons to take this   linaclotide  145 MCG Caps capsule Commonly known as: Linzess  Take 1 capsule (145 mcg total) by mouth daily before breakfast.   megestrol  625 MG/5ML suspension Commonly known as: [name]  ES Take 5 mLs (625 mg total) by mouth daily.   metoprolol  tartrate 50 MG tablet Commonly known as: LOPRESSOR  Take 1 tablet (50 mg total) by mouth 2 (two) times daily.   mirtazapine  30 MG tablet Commonly known as: Remeron  Take 1 tablet (30 mg total) by mouth at bedtime.   ondansetron  8 MG tablet Commonly known as: Zofran  Take 1 tablet (8 mg total) by mouth every 8 (eight) hours as needed for nausea or vomiting.   oxyCODONE  5 MG immediate release tablet Commonly known as: Oxy IR/ROXICODONE  Take 1 tablet (5 mg total) by mouth every 4 (four) hours as needed for severe pain (pain score 7-10).   polyethylene glycol 17 g packet Commonly known as:  MIRALAX  / GLYCOLAX  Take 17 g by mouth daily as needed for mild constipation.   prochlorperazine  10 MG tablet Commonly known as: COMPAZINE  Take 1 tablet (10 mg total) by mouth every 6 (six) hours as needed for nausea or vomiting.   rosuvastatin  40 MG tablet Commonly known as: CRESTOR  TAKE 1 TABLET (40 MG TOTAL) BY MOUTH EVERY MORNING. PLEASE KEEP SCHEDULED APPOINTMENT   sitaGLIPtin  50 MG tablet Commonly known as: Januvia  Take 1 tablet (50 mg total) by mouth daily.   sodium bicarbonate  325 MG tablet Take 650 mg by mouth 2 (two) times daily.   tamsulosin  0.4 MG Caps capsule Commonly known as: FLOMAX  TAKE ONE CAPSULE BY MOUTH  DAILY   torsemide  20 MG tablet Commonly known as: DEMADEX  Take 2 tablets (40 mg total) by mouth daily. Start taking on: July 16, 2024   traZODone  50 MG tablet Commonly known as: DESYREL  Take 0.5-1 tablets (25-50 mg total) by mouth at bedtime as needed for sleep.               Discharge Care Instructions  (From admission, onward)           Start     Ordered   07/15/24 0000  Discharge wound care:       Comments: routine   07/15/24 1014           Allergies  Allergen Reactions   Xarelto  [Rivaroxaban ] Other (See Comments)    -Skin Blisters     Follow-up Information     Care, [facility] Follow up.   Contact information: [address] [place] [address] [phone]                  The results of significant diagnostics from this hospitalization (including imaging, microbiology, ancillary and laboratory) are listed below for reference.    Significant Diagnostic Studies: DG Chest Portable 1 View Result Date: 07/11/2024 CLINICAL DATA:  Shortness of breath.  Tachycardia. EXAM: PORTABLE CHEST 1 VIEW COMPARISON:  Radiograph 2 days ago 07/09/2024 FINDINGS: Right-sided intra line tip projects over the SVC. Cardiomegaly is unchanged. Stable mediastinal contours with aortic atherosclerosis. Improving interstitial opacities. No pneumothorax or large pleural effusion. No confluent consolidation. Stable osseous structures. IMPRESSION: 1. Improving interstitial opacities, improving pulmonary edema versus infection. 2. Unchanged cardiomegaly. Electronically Signed   By: Andrea Gasman M.D.   On: 07/11/2024 15:24   CT HEAD WO CONTRAST Result Date: 07/11/2024 EXAM: CT HEAD WITHOUT CONTRAST 07/11/2024 01:07:17 PM TECHNIQUE: CT of the head was performed without the administration of intravenous contrast. Automated exposure control, iterative reconstruction, and/or weight based adjustment of the mA/kV was utilized to reduce the radiation dose to as  low as reasonably achievable. COMPARISON: CT head 06/28/2024 CLINICAL HISTORY: AMS. Pt BIB [facility] from [facility], was called due to tachycardia and hypertension. FINDINGS: BRAIN AND VENTRICLES: Within limitations of motion, no acute infarct, intracranial hemorrhage, mass, midline shift, hydrocephalus, or extra-axial fluid collection is identified. Mild cerebral atrophy. Grossly unchanged cerebral white matter hypodensities, nonspecific but compatible with mild chronic small vessel ischemic disease. ORBITS: Left cataract extraction. SINUSES: No acute abnormality. SOFT TISSUES AND SKULL: Calcified atherosclerosis at the skull base. Venous gas in the scalp and infratemporal fossa bilaterally, likely iatrogenic. IMPRESSION: 1. No evidence of an acute intracranial abnormality on this motion-degraded study. Electronically signed by: Dasie Hamburg MD 07/11/2024 01:21 PM EDT RP Workstation: [id number]   DG CHEST PORT 1 VIEW Result Date: 07/09/2024 EXAM: 1 VIEW XRAY OF THE CHEST 07/09/2024  07:57:47 AM COMPARISON: AP radiograph of chest dated 06/29/2024. CLINICAL HISTORY: Dyspnea. FINDINGS: LUNGS AND PLEURA: Prominent reticular nodular opacities present within the lungs bilaterally. Mild hazy appearance of the lung bases. No pleural effusion. No pneumothorax. HEART AND MEDIASTINUM: No acute abnormality of the cardiac and mediastinal silhouettes. Patient is status post closure of the left atrial appendage. BONES AND SOFT TISSUES: No acute osseous abnormality. A right internal jugular tunneled catheter has been placed and its tip is near the junction of the superior vena cava and right atrium. IMPRESSION: 1. Prominent reticular nodular opacities bilaterally and mild hazy appearance of the lung bases. The findings may represent mild edema or subtle pneumonitis. 2. Right internal jugular tunneled catheter in place with tip near the junction of the superior vena cava and right atrium. Electronically signed by: Evalene Coho  MD 07/09/2024 08:10 AM EDT RP Workstation: [id number]   IR TUNNELED CENTRAL VENOUS CATH PLC W IMG Result Date: 07/04/2024 CLINICAL DATA:  Pneumonia, bladder carcinoma, needs durable venous access for planned antibiotic regimen EXAM: TUNNELED CENTRAL VENOUS CATHETER PLACEMENT WITH ULTRASOUND AND FLUOROSCOPIC GUIDANCE TECHNIQUE: The procedure, risks, benefits, and alternatives were explained to the patient. Questions regarding the procedure were encouraged and answered. The patient understands and consents to the procedure. Patency of the right IJ vein was confirmed with ultrasound with image documentation. An appropriate skin site was determined. Region was prepped using maximum barrier technique including cap and mask, sterile gown, sterile gloves, large sterile sheet, and Chlorhexidine  as cutaneous antisepsis. The region was infiltrated locally with 1% lidocaine . Under real-time ultrasound guidance, the right IJ vein was accessed with a 21 gauge micropuncture needle; the needle tip within the vein was confirmed with ultrasound image documentation. 70F dual-lumen cuffed PowerLine tunneled from a right anterior chest wall approach to the dermatotomy site. Needle exchanged over the 018 guidewire for transitional dilator, through which the catheter which had been cut to 24 cm was advanced under intermittent fluoroscopy, positioned with its tip at the cavoatrial junction. Spot chest radiograph confirms good catheter position. No pneumothorax. Catheter was flushed per protocol. Catheter secured externally with O Prolene suture. The right IJ dermatotomy site was closed with Dermabond. COMPLICATIONS: COMPLICATIONS None immediate FLUOROSCOPY TIME:  Radiation Exposure Index (as provided by the fluoroscopic device): 1.3 mGy air Kerma COMPARISON:  None Available. IMPRESSION: 1. Technically successful placement of tunneled right IJ tunneled dual-lumen power injectable catheter with ultrasound and fluoroscopic guidance. Ready  for routine use. Electronically Signed   By: JONETTA Faes M.D.   On: 07/04/2024 15:30   US  EKG SITE RITE Result Date: 07/03/2024 If [facility] image not attached, placement could not be confirmed due to current cardiac rhythm.  ECHO TEE Result Date: 07/02/2024    TRANSESOPHOGEAL ECHO REPORT   Patient Name:   Adam Harris  Date of Exam: 07/02/2024 Medical Rec #:  [id number]  Height:       71.0 in Accession #:    [id number] Weight:       192.9 lb Date of Birth:  1946-01-12   BSA:          2.076 m Patient Age:    78 years   BP:           153/97 mmHg Patient Gender: M          HR:           128 bpm. Exam Location:  Inpatient Procedure: Cardiac Doppler, Color Doppler and Transesophageal Echo (Both  Spectral and Color Flow Doppler were utilized during procedure). Indications:     Endocarditis R78.81  History:         Patient has prior history of Echocardiogram examinations, most                  recent 06/30/2024. CAD and Previous Myocardial Infarction, CKD,                  Arrythmias:Atrial Fibrillation; Risk Factors:Hypertension,                  Dyslipidemia, Diabetes and Former Smoker.  Sonographer:     Koleen Popper RDCS Referring Phys:  [id number] ZANE ADAMS Diagnosing Phys: Jerel Balding MD PROCEDURE: After discussion of the risks and benefits of a TEE, an informed consent was obtained from the patient. Pediatric probe was used. Imaged were obtained with the patient in a left lateral decubitus position. Sedation performed by different physician. The patient was monitored while under deep sedation. Anesthestetic sedation was provided intravenously by Anesthesiology: 245mg  of Propofol , 60mg  of Lidocaine . The patient developed no complications during the procedure.  IMPRESSIONS  1. Left ventricular ejection fraction, by estimation, is 65 to 70%. The left ventricle has normal function. The left ventricle has no regional wall motion abnormalities. There is moderate concentric left ventricular hypertrophy. Left  ventricular diastolic function could not be evaluated.  2. Right ventricular systolic function is normal. The right ventricular size is normal. There is severely elevated pulmonary artery systolic pressure. The estimated right ventricular systolic pressure is 79.0 mmHg.  3. Well seated left atrial appendage occluder (Watchman) device without leak, thrombus or vegetation. Left atrial size was severely dilated. No left atrial/left atrial appendage thrombus was detected.  4. Right atrial size was mildly dilated.  5. The mitral valve is normal in structure. Trivial mitral valve regurgitation. No evidence of mitral stenosis.  6. Tricuspid valve regurgitation is moderate to severe.  7. The aortic valve is tricuspid. There is mild calcification of the aortic valve. There is mild thickening of the aortic valve. Aortic valve regurgitation is trivial. Aortic valve sclerosis is present, with no evidence of aortic valve stenosis.  8. There is mild (Grade II) plaque involving the aortic arch and descending aorta. Conclusion(s)/Recommendation(s): No evidence of vegetation/infective endocarditis on this transesophageael echocardiogram. FINDINGS  Left Ventricle: Left ventricular ejection fraction, by estimation, is 65 to 70%. The left ventricle has normal function. The left ventricle has no regional wall motion abnormalities. The left ventricular internal cavity size was normal in size. There is  moderate concentric left ventricular hypertrophy. Left ventricular diastolic function could not be evaluated due to atrial fibrillation. Left ventricular diastolic function could not be evaluated. Right Ventricle: The right ventricular size is normal. No increase in right ventricular wall thickness. Right ventricular systolic function is normal. There is severely elevated pulmonary artery systolic pressure. The tricuspid regurgitant velocity is 4.00 m/s, and with an assumed right atrial pressure of 15 mmHg, the estimated right ventricular  systolic pressure is 79.0 mmHg. Left Atrium: Well seated left atrial appendage occluder (Watchman) device without leak, thrombus or vegetation. Left atrial size was severely dilated. Spontaneous echo contrast was present in the left atrium. No left atrial/left atrial appendage thrombus  was detected. Right Atrium: Right atrial size was mildly dilated. Pericardium: There is no evidence of pericardial effusion. Mitral Valve: The mitral valve is normal in structure. Trivial mitral valve regurgitation. No evidence of mitral valve stenosis. Tricuspid Valve: The tricuspid valve is grossly  normal. Tricuspid valve regurgitation is moderate to severe. Aortic Valve: The aortic valve is tricuspid. There is mild calcification of the aortic valve. There is mild thickening of the aortic valve. Aortic valve regurgitation is trivial. Aortic valve sclerosis is present, with no evidence of aortic valve stenosis. Pulmonic Valve: The pulmonic valve was normal in structure. Pulmonic valve regurgitation is not visualized. Aorta: The aortic root, ascending aorta, aortic arch and descending aorta are all structurally normal, with no evidence of dilitation or obstruction. There is mild (Grade II) plaque involving the aortic arch and descending aorta. IAS/Shunts: The interatrial septum appears to be lipomatous. No atrial level shunt detected by color flow Doppler. Additional Comments: Spectral Doppler performed. TRICUSPID VALVE TR Peak grad:   64.0 mmHg TR Vmax:        400.00 cm/s Jerel Balding MD Electronically signed by Jerel Balding MD Signature Date/Time: 07/02/2024/1:40:21 PM    Final    EP STUDY Result Date: 07/02/2024 See surgical note for result.  ECHOCARDIOGRAM COMPLETE BUBBLE STUDY Result Date: 06/30/2024    ECHOCARDIOGRAM REPORT   Patient Name:   Adam Harris  Date of Exam: 06/30/2024 Medical Rec #:  [id number]  Height:       71.0 in Accession #:    [id number] Weight:       198.4 lb Date of Birth:  1946-04-09   BSA:          2.101  m Patient Age:    78 years   BP:           137/95 mmHg Patient Gender: M          HR:           112 bpm. Exam Location:  Inpatient Procedure: 2D Echo, Cardiac Doppler and Color Doppler (Both Spectral and Color            Flow Doppler were utilized during procedure). Indications:    Bacteremia [790.7.ICD-9-CM]  History:        Patient has prior history of Echocardiogram examinations, most                 recent 06/12/2022. Risk Factors:Hypertension.  Sonographer:    Benard Stallion Referring Phys: [id number] GREGORY D CALONE IMPRESSIONS  1. Left ventricular ejection fraction, by estimation, is 60 to 65%. The left ventricle has normal function. The left ventricle has no regional wall motion abnormalities. There is moderate asymmetric left ventricular hypertrophy of the basal-septal segment. Left ventricular diastolic parameters are indeterminate.  2. Right ventricular systolic function is normal. The right ventricular size is normal. There is severely elevated pulmonary artery systolic pressure. The estimated right ventricular systolic pressure is 77.4 mmHg.  3. Left atrial size was moderately dilated.  4. Right atrial size was mildly dilated.  5. Negative bubble study, no evidence for PFO or ASD.  6. The mitral valve is normal in structure. Trivial mitral valve regurgitation. No evidence of mitral stenosis.  7. Tricuspid valve regurgitation is moderate.  8. The aortic valve is tricuspid. There is mild calcification of the aortic valve. Aortic valve regurgitation is not visualized. No aortic stenosis is present.  9. Aortic dilatation noted. There is borderline dilatation of the ascending aorta, measuring 38 mm. 10. The inferior vena cava is dilated in size with <50% respiratory variability, suggesting right atrial pressure of 15 mmHg. 11. The patient was in atrial fibrillation. 12. No valvular vegetation was visualized. FINDINGS  Left Ventricle: Left ventricular ejection fraction, by estimation, is 60 to 65%. The  left  ventricle has normal function. The left ventricle has no regional wall motion abnormalities. The left ventricular internal cavity size was normal in size. There is  moderate asymmetric left ventricular hypertrophy of the basal-septal segment. Left ventricular diastolic parameters are indeterminate. Right Ventricle: The right ventricular size is normal. No increase in right ventricular wall thickness. Right ventricular systolic function is normal. There is severely elevated pulmonary artery systolic pressure. The tricuspid regurgitant velocity is 3.95 m/s, and with an assumed right atrial pressure of 15 mmHg, the estimated right ventricular systolic pressure is 77.4 mmHg. Left Atrium: Left atrial size was moderately dilated. Right Atrium: Right atrial size was mildly dilated. Pericardium: There is no evidence of pericardial effusion. Mitral Valve: The mitral valve is normal in structure. Trivial mitral valve regurgitation. No evidence of mitral valve stenosis. Tricuspid Valve: The tricuspid valve is normal in structure. Tricuspid valve regurgitation is moderate. Aortic Valve: The aortic valve is tricuspid. There is mild calcification of the aortic valve. Aortic valve regurgitation is not visualized. No aortic stenosis is present. Aortic valve mean gradient measures 2.0 mmHg. Aortic valve peak gradient measures 3.6 mmHg. Aortic valve area, by VTI measures 3.63 cm. Pulmonic Valve: The pulmonic valve was normal in structure. Pulmonic valve regurgitation is trivial. Aorta: The aortic root is normal in size and structure and aortic dilatation noted. There is borderline dilatation of the ascending aorta, measuring 38 mm. Venous: The inferior vena cava is dilated in size with less than 50% respiratory variability, suggesting right atrial pressure of 15 mmHg. IAS/Shunts: Negative bubble study, no evidence for PFO or ASD.  LEFT VENTRICLE PLAX 2D LVIDd:         2.50 cm   Diastology LVIDs:         2.10 cm   LV e' medial:     6.85 cm/s LV PW:         1.20 cm   LV E/e' medial:  11.2 LV IVS:        1.20 cm   LV e' lateral:   8.81 cm/s LVOT diam:     2.20 cm   LV E/e' lateral: 8.7 LV SV:         57 LV SV Index:   27 LVOT Area:     3.80 cm  RIGHT VENTRICLE RV Basal diam:  3.50 cm RV Mid diam:    2.70 cm RV S prime:     9.61 cm/s TAPSE (M-mode): 1.6 cm LEFT ATRIUM             Index        RIGHT ATRIUM           Index LA diam:        3.90 cm 1.86 cm/m   RA Area:     22.00 cm LA Vol (A2C):   95.8 ml 45.59 ml/m  RA Volume:   58.60 ml  27.89 ml/m LA Vol (A4C):   81.3 ml 38.69 ml/m LA Biplane Vol: 91.9 ml 43.73 ml/m  AORTIC VALVE AV Area (Vmax):    3.45 cm AV Area (Vmean):   3.11 cm AV Area (VTI):     3.63 cm AV Vmax:           95.30 cm/s AV Vmean:          68.600 cm/s AV VTI:            0.156 m AV Peak Grad:      3.6 mmHg AV Mean Grad:  2.0 mmHg LVOT Vmax:         86.60 cm/s LVOT Vmean:        56.200 cm/s LVOT VTI:          0.149 m LVOT/AV VTI ratio: 0.96  AORTA Ao Root diam: 3.10 cm Ao Asc diam:  3.80 cm MITRAL VALVE               TRICUSPID VALVE MV Area (PHT): 6.96 cm    TR Peak grad:   62.4 mmHg MV Decel Time: 109 msec    TR Vmax:        395.00 cm/s MV E velocity: 76.40 cm/s MV A velocity: 27.10 cm/s  SHUNTS MV E/A ratio:  2.82        Systemic VTI:  0.15 m                            Systemic Diam: 2.20 cm Dalton McleanMD Electronically signed by Ezra Kanner Signature Date/Time: 06/30/2024/9:06:21 PM    Final    CT CHEST WO CONTRAST Result Date: 06/29/2024 CLINICAL DATA:  Pneumonia suspected, uncomplicated, no prior imaging (Ped >= 70mo). History of urinary bladder cancer. EXAM: CT CHEST WITHOUT CONTRAST TECHNIQUE: Multidetector CT imaging of the chest was performed following the standard protocol without IV contrast. RADIATION DOSE REDUCTION: This exam was performed according to the departmental dose-optimization program which includes automated exposure control, adjustment of the mA and/or kV according to patient size  and/or use of iterative reconstruction technique. COMPARISON:  PET CT 06/13/2024, CT chest 07/30/2024, chest x-ray 06/29/2024 FINDINGS: Cardiovascular: Normal heart size. No significant pericardial effusion. The thoracic aorta is normal in caliber. Severe atherosclerotic plaque of the thoracic aorta. Four-vessel coronary artery calcifications. Amplatzer device noted along the left atria. Mediastinum/Nodes: No gross hilar adenopathy, noting limited sensitivity for the detection of hilar adenopathy on this noncontrast study. Persistent multiple prominent but nonenlarged mediastinal lymph nodes. No enlarged mediastinal or axillary lymph nodes. Thyroid  gland, trachea, and esophagus demonstrate no significant findings. Lungs/Pleura: Interval development of left upper lobe patchy ground-glass airspace opacities more prominent along the dependent level. Similar findings along the left lower lobe. Redemonstration of chronic several scattered calcified micronodules along the bilateral lungs. Mild interlobular septal wall thickening. Along the apices. No pulmonary mass. Trace bilateral, left greater than right, pleural effusions. No pneumothorax. Upper Abdomen: Fluid dense lesion along the left superior renal pole suggestive of a simple renal cyst. Simple renal cysts, in the absence of clinically indicated signs/symptoms, require no independent follow-up. Atherosclerotic plaque. Musculoskeletal: No abdominal wall hernia or abnormality.  Bilateral gynecomastia. No suspicious lytic or blastic osseous lesions. No acute displaced fracture. Multilevel degenerative changes of the spine. IMPRESSION: 1. Mild pulmonary edema with left upper lobe patchy ground-glass airspace opacities more prominent along the dependent level. Similar findings along the left lower lobe. Finding could represent superimposed developing infection/inflammation. 2. Trace bilateral, left greater than right, pleural effusions. 3. Redemonstration of colonic  several scattered calcified micronodules along the bilateral lungs. Electronically Signed   By: Morgane  Naveau M.D.   On: 06/29/2024 13:58   DG CHEST PORT 1 VIEW Result Date: 06/29/2024 EXAM: 1 VIEW XRAY OF THE CHEST 06/29/2024 10:09:00 AM COMPARISON: 06/28/2024 CLINICAL HISTORY: Fluid excess. FINDINGS: LUNGS AND PLEURA: Interval increase in diffuse interstitial markings. No consolidative change. No significant pleural effusion or pneumothorax. HEART AND MEDIASTINUM: No acute abnormality of the cardiac and mediastinal silhouettes. Aortic atherosclerotic calcification. BONES AND SOFT TISSUES:  No acute osseous abnormality. IMPRESSION: 1. Interval increase in diffuse interstitial markings. Favor pulmonary edema versus atypical infection. 2. No significant pleural effusion or pneumothorax. 3. No consolidative change. Electronically signed by: Waddell Calk MD 06/29/2024 10:15 AM EDT RP Workstation: [id number]   CT Cervical Spine Wo Contrast Result Date: 06/28/2024 CLINICAL DATA:  Fall EXAM: CT CERVICAL SPINE WITHOUT CONTRAST TECHNIQUE: Multidetector CT imaging of the cervical spine was performed without intravenous contrast. Multiplanar CT image reconstructions were also generated. RADIATION DOSE REDUCTION: This exam was performed according to the departmental dose-optimization program which includes automated exposure control, adjustment of the mA and/or kV according to patient size and/or use of iterative reconstruction technique. COMPARISON:  None Available. FINDINGS: Alignment: No subluxation Skull base and vertebrae: No acute fracture. No primary bone lesion or focal pathologic process. Soft tissues and spinal canal: No prevertebral fluid or swelling. No visible canal hematoma. Disc levels: Moderate degenerative disc disease with disc space narrowing and spurring. Advanced degenerative facet disease, left greater than right. Multilevel bilateral neural foraminal narrowing. Upper chest: No acute findings  Other: None IMPRESSION: Moderate to advanced degenerative changes as above. No acute bony abnormality. Electronically Signed   By: Franky Crease M.D.   On: 06/28/2024 01:06   CT Head Wo Contrast Result Date: 06/28/2024 CLINICAL DATA:  Fall EXAM: CT HEAD WITHOUT CONTRAST TECHNIQUE: Contiguous axial images were obtained from the base of the skull through the vertex without intravenous contrast. RADIATION DOSE REDUCTION: This exam was performed according to the departmental dose-optimization program which includes automated exposure control, adjustment of the mA and/or kV according to patient size and/or use of iterative reconstruction technique. COMPARISON:  05/25/2024 FINDINGS: Brain: No acute intracranial abnormality. Specifically, no hemorrhage, hydrocephalus, mass lesion, acute infarction, or significant intracranial injury. Vascular: No hyperdense vessel or unexpected calcification. Skull: No acute calvarial abnormality. Sinuses/Orbits: No acute findings Other: None IMPRESSION: No acute intracranial abnormality. Electronically Signed   By: Franky Crease M.D.   On: 06/28/2024 01:04   DG Chest Port 1 View Result Date: 06/28/2024 CLINICAL DATA:  Questionable sepsis.  Fall. EXAM: PORTABLE CHEST 1 VIEW COMPARISON:  11/29/2023 FINDINGS: Heart and mediastinal contours are within normal limits. No confluent airspace opacities or effusions. No acute bony abnormality. IMPRESSION: No active disease. Electronically Signed   By: Franky Crease M.D.   On: 06/28/2024 00:57   IR NEPHROSTOMY EXCHANGE LEFT Result Date: 06/27/2024 INDICATION: Indwelling left percutaneous nephrostomy tube. 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 EXAM: LEFT PERCUTANEOUS NEPHROSTOMY TUBE EXCHANGE UNDER FLUOROSCOPY INCLUDING NEPHROSTOGRAM COMPARISON:  None Available. MEDICATIONS: None ANESTHESIA/SEDATION: None CONTRAST:  30 mL Omnipaque  300-administered into the collecting system(s) FLUOROSCOPY:  Radiation Exposure Index (as provided by the fluoroscopic device): 53 mGy Kerma COMPLICATIONS: None immediate. PROCEDURE: Informed written consent was obtained from the patient after a thorough discussion of the procedural risks, benefits and alternatives. All questions were addressed. Maximal Sterile Barrier Technique was utilized including caps, mask, sterile gowns, sterile gloves, sterile drape, hand hygiene and skin antiseptic. A timeout was performed prior to the initiation of the procedure. Contrast was injected via the pre-existing left nephrostomy tube and fluoroscopic imaging safe. The nephrostomy tube was cut and removed over a guidewire. A 5 [place] Kumpe catheter was advanced into the left ureter and additional contrast injection performed and imaging performed of the entire left ureter into the pelvis. A new 10 French nephrostomy tube was then advanced over the wire and formed at the level of the left renal pelvis.  A fluoroscopic image was saved to confirm tube position. The tube was then connected to a new gravity drainage bag and secured at the exit site with a Prolene retention suture and StatLock device. FINDINGS: Nephrostogram demonstrates poor antegrade flow of contrast down the ureter. Additional catheter injection of the left ureter shows ureteral dilatation and evidence of high-grade ureteral obstruction in the pelvis just above the UVJ. A guidewire would not cross into the bladder lumen. The replaced nephrostomy tube will be left to gravity drainage. IMPRESSION: Left percutaneous nephrostomy tube exchange under fluoroscopy. Detailed nephrostogram performed today including evaluation of the entire ureter demonstrates high-grade obstruction of the distal ureter at the ureterovesical junction. This could not be crossed with a guidewire and the nephrostomy tube was replaced and will be left to gravity bag drainage. Electronically Signed   By: Marcey Moan M.D.   On: 06/27/2024 16:45     Microbiology: Recent Results (from the past 240 hours)  Culture, blood (Routine X 2) w Reflex to ID Panel     Status: None   Collection Time: 07/07/24  6:34 AM   Specimen: BLOOD  Result Value Ref Range Status   Specimen Description BLOOD RIGHT ANTECUBITAL  Final   Special Requests   Final    BOTTLES DRAWN AEROBIC ONLY Blood Culture adequate volume   Culture   Final    NO GROWTH 5 DAYS Performed at [facility], [address], [place], [place] [address]    Report Status 07/12/2024 FINAL  Final  Culture, blood (Routine X 2) w Reflex to ID Panel     Status: None   Collection Time: 07/07/24  6:35 AM   Specimen: BLOOD  Result Value Ref Range Status   Specimen Description BLOOD LEFT ANTECUBITAL  Final   Special Requests   Final    BOTTLES DRAWN AEROBIC ONLY Blood Culture adequate volume   Culture   Final    NO GROWTH 5 DAYS Performed at [facility], [address] [place], [place] [address]    Report Status 07/12/2024 FINAL  Final  Blood culture (routine x 2)     Status: None (Preliminary result)   Collection Time: 07/11/24 12:49 PM   Specimen: BLOOD  Result Value Ref Range Status   Specimen Description BLOOD SITE NOT SPECIFIED  Final   Special Requests   Final    BOTTLES DRAWN AEROBIC AND ANAEROBIC Blood Culture results may not be optimal due to an inadequate volume of blood received in culture bottles   Culture   Final    NO GROWTH 4 DAYS Performed at [facility], [address] [place], [place] [address]    Report Status PENDING  Incomplete  Blood culture (routine x 2)     Status: None (Preliminary result)   Collection Time: 07/11/24 12:52 PM   Specimen: BLOOD  Result Value Ref Range Status   Specimen Description BLOOD SITE NOT SPECIFIED  Final   Special Requests   Final    BOTTLES DRAWN AEROBIC AND ANAEROBIC Blood Culture results may not be optimal due to an inadequate volume of blood received in culture bottles   Culture   Final    NO GROWTH 4  DAYS Performed at [facility], [address], [place], [place] [address]    Report Status PENDING  Incomplete  MRSA Next Gen by PCR, Nasal     Status: Abnormal   Collection Time: 07/11/24  5:42 PM   Specimen: Nasal  Mucosa; Nasal Swab  Result Value Ref Range Status   MRSA by PCR Next Gen DETECTED (A) NOT DETECTED Final    Comment: RESULT CALLED TO, READ BACK BY AND VERIFIED WITH: RN JESSICA G [id number] FCP (NOTE) The GeneXpert MRSA Assay (FDA approved for NASAL specimens only), is one component of a comprehensive MRSA colonization surveillance program. It is not intended to diagnose MRSA infection nor to guide or monitor treatment for MRSA infections. Test performance is not FDA approved in patients less than 36 years old. Performed at [facility], [address], [place], [place] [address]      Labs: Basic Metabolic Panel: Recent Labs  Lab 07/11/24 1233 07/12/24 0500 07/13/24 0510 07/14/24 0500 07/15/24 0020  NA 138 140 139 142 138  K 4.2 3.6 3.7 3.8 3.4*  CL 104 105 106 105 104  CO2 20* 23 22 24 25   GLUCOSE 182* 118* 138* 149* 164*  BUN 21 23 21 22 20   CREATININE 2.05* 1.93* 2.13* 2.09* 2.21*  CALCIUM  8.4* 7.9* 7.7* 8.0* 7.8*  MG  --  1.6*  --   --  2.0   Liver Function Tests: Recent Labs  Lab 07/11/24 1233 07/15/24 0020  AST 33 40  ALT 26 27  ALKPHOS 118 127*  BILITOT 3.4* 1.5*  PROT 7.4 6.4*  ALBUMIN  2.1* 1.7*   No results for input(s): LIPASE, AMYLASE in the last 168 hours. Recent Labs  Lab 07/11/24 1233  AMMONIA 29   CBC: Recent Labs  Lab 07/09/24 0045 07/11/24 1233 07/12/24 0500 07/13/24 0510 07/14/24 0500  WBC 5.5 12.7* 15.2* 12.9* 11.0*  NEUTROABS 2.0 9.5*  --   --   --   HGB 9.9* 11.6* 10.7* 10.2* 10.3*  HCT 32.5* 37.8* 34.6* 33.1* 33.6*  MCV 78.1* 77.3* 76.2* 76.3* 76.7*  PLT 278 309 322 245 244   Cardiac Enzymes: Recent Labs  Lab 07/14/24 0500  CKTOTAL 40*   BNP: BNP (last 3 results) Recent Labs     07/11/24 1233  BNP 1,285.0*    ProBNP (last 3 results) No results for input(s): PROBNP in the last 8760 hours.  CBG: Recent Labs  Lab 07/14/24 1618 07/14/24 2206 07/15/24 0606 07/15/24 0812 07/15/24 1129  GLUCAP 195* 176* 170* 133* 188*       Signed:  Sigurd Pac MD.  [facility] 07/15/2024, 11:53 AM

## 2024-07-16 ENCOUNTER — Telehealth: Payer: Self-pay

## 2024-07-16 ENCOUNTER — Other Ambulatory Visit

## 2024-07-16 ENCOUNTER — Inpatient Hospital Stay: Admitting: Hematology

## 2024-07-16 ENCOUNTER — Ambulatory Visit: Admitting: Hematology

## 2024-07-16 ENCOUNTER — Telehealth: Payer: Self-pay | Admitting: *Deleted

## 2024-07-16 ENCOUNTER — Inpatient Hospital Stay

## 2024-07-16 ENCOUNTER — Ambulatory Visit

## 2024-07-16 LAB — CULTURE, BLOOD (ROUTINE X 2)
Culture: NO GROWTH
Culture: NO GROWTH

## 2024-07-16 NOTE — Progress Notes (Unsigned)
 Complex Care Management Note Care Guide Note  07/16/2024 Name: Adam Harris MRN: [id number] DOB: [birth year]   Complex Care Management Outreach Attempts: An unsuccessful telephone outreach was attempted today to offer the patient information about available complex care management services.  Follow Up Plan:  Additional outreach attempts will be made to offer the patient complex care management information and services.   Encounter Outcome:  No Answer  Dreama [facility]  [facility], [facility] VBCI Assistant Direct Dial: [phone]  Fax: [phone]

## 2024-07-16 NOTE — Transitions of Care (Post Inpatient/ED Visit) (Signed)
   07/16/2024  Name: Adam Harris MRN: [id number] DOB: 1946-03-01  Today's TOC FU Call Status: RN made outreach to patient/family. No answer. Left voice mail message.   TOC RN made outreach to verify receipt of hospice referral and start of care. RN confirmed that patient is actively enrolled in hospice program. Hospice Agency:[facility] RN spoke with:Adam Harris Start of Care Date: [id number] No further interventions at this time.  Cathlean Headland BSN RN [facility] Health Care Management Coordinator [email or web address] Direct Dial: [phone]  Fax: [phone] Website: [email or web address]

## 2024-07-16 NOTE — Telephone Encounter (Signed)
 LVM stating that Dr. Lanny will reschedule pt's appts for today d/t pt was recently seen in the hospital on 07/11/2024.  Stated Dr. Demetra scheduler will contact the pt to reschedule the appts.

## 2024-07-17 ENCOUNTER — Inpatient Hospital Stay: Attending: Hematology

## 2024-07-17 ENCOUNTER — Inpatient Hospital Stay: Admitting: Hematology

## 2024-07-17 ENCOUNTER — Inpatient Hospital Stay

## 2024-07-17 ENCOUNTER — Inpatient Hospital Stay: Attending: Hematology | Admitting: Dietician

## 2024-07-17 NOTE — Progress Notes (Signed)
 Nutrition Follow Up:  Reached out to spouse at her mobile#.  Patient has has 2 hospital stays past month and most recently discharged to home hospice.  Offered my support as resource she she had any questions or concerns.   She relayed that he was eating better just not a lot.  She didn't have any questions or nutritional needs at this time. Offered contact as resource as desired.  Micheline Craven, RDN, LDN Registered Dietitian, [facility] Part Time Remote (Usual office hours: Tuesday-Thursday) Mobile: [phone] Remote Office: [phone]

## 2024-07-18 NOTE — Progress Notes (Signed)
 Complex Care Management Care Guide Note  07/18/2024 Name: [name] MRN: [id number] DOB: 10/26/1946  Adam Harris is a 78 y.o. year old male who is a primary care patient of [name], MD and is actively engaged with the care management team. I reached out to Magdalene Arnt by phone today to assist with re-scheduling  with the RN Case Manager.  Follow up plan: Telephone appointment with complex care management team member scheduled for:  07/28/24 at 3:00 p.m.   Dreama [facility]  [facility], [facility] VBCI Assistant Direct Dial: [phone]  Fax: [phone]

## 2024-07-20 ENCOUNTER — Other Ambulatory Visit: Payer: Self-pay | Admitting: Hematology

## 2024-07-23 ENCOUNTER — Inpatient Hospital Stay

## 2024-07-23 ENCOUNTER — Inpatient Hospital Stay: Admitting: Hematology

## 2024-07-24 ENCOUNTER — Inpatient Hospital Stay: Admitting: Internal Medicine

## 2024-07-24 ENCOUNTER — Telehealth: Payer: Self-pay

## 2024-07-24 ENCOUNTER — Other Ambulatory Visit: Payer: Self-pay

## 2024-07-24 NOTE — Telephone Encounter (Signed)
 Spoke with Ms. Washington  at [facility] to see if they had labs for patient. She reports Adam Harris likely will not be at his appointment today as he is currently in the dying process. Will update pharmacy team.   Leslee Haueter, BSN, RN

## 2024-07-24 NOTE — Telephone Encounter (Signed)
 Thank you :)

## 2024-07-25 ENCOUNTER — Inpatient Hospital Stay: Admitting: Nurse Practitioner

## 2024-07-28 ENCOUNTER — Telehealth: Payer: Self-pay

## 2024-07-28 NOTE — Transitions of Care (Post Inpatient/ED Visit) (Signed)
   07/28/2024  Name: Adam Harris MRN: [id number] DOB: [birth year]  Today's TOC FU Call Status:    Attempted to reach the patient regarding the most recent Inpatient/ED visit. Patient was showing discharge from [facility]. Unable to reach voicemail and contacted [facility], spoke with Lindie who confirmed patient deceased on 08/03/24 at 5:25 pm.  Will update CCM RN of acknowledgement.  Richerd Fish, RN, BSN, CCM [facility], [facility] RN Care Manager Direct Dial: [phone]

## 2024-07-29 ENCOUNTER — Telehealth: Payer: Self-pay

## 2024-07-29 NOTE — Telephone Encounter (Signed)
 Spoke to patient's son, [name], they are doing as well as can be and the funeral is going to be Thursday 07/31/24 in [place] . Due to patient passing away on Aug 03, 2024.  Dm/cma

## 2024-07-29 NOTE — Telephone Encounter (Signed)
 Reached out and left VM for patients daughter to call back.  Tried calling the son's number as well as [name]'s and no VM available.  Dm/cma

## 2024-08-04 ENCOUNTER — Encounter (HOSPITAL_COMMUNITY): Payer: Self-pay | Admitting: Radiology

## 2024-08-04 ENCOUNTER — Other Ambulatory Visit (HOSPITAL_COMMUNITY): Payer: Self-pay | Admitting: Interventional Radiology

## 2024-08-04 DIAGNOSIS — N1330 Unspecified hydronephrosis: Secondary | ICD-10-CM

## 2024-08-04 NOTE — Addendum Note (Signed)
 Encounter addended by: Crawford Dorothyann POUR on: 08/04/2024 12:41 PM  Actions taken: SmartForm saved

## 2024-08-07 ENCOUNTER — Inpatient Hospital Stay: Admitting: Nurse Practitioner

## 2024-08-07 ENCOUNTER — Inpatient Hospital Stay

## 2024-08-11 ENCOUNTER — Ambulatory Visit: Admitting: Cardiology

## 2024-08-13 ENCOUNTER — Inpatient Hospital Stay

## 2024-08-13 ENCOUNTER — Inpatient Hospital Stay: Admitting: Hematology

## 2024-08-13 DEATH — deceased

## 2024-08-19 ENCOUNTER — Ambulatory Visit: Admitting: Family Medicine

## 2024-08-22 ENCOUNTER — Other Ambulatory Visit (HOSPITAL_COMMUNITY)

## 2024-08-27 ENCOUNTER — Ambulatory Visit: Admitting: Hematology

## 2024-08-27 ENCOUNTER — Ambulatory Visit

## 2024-08-27 ENCOUNTER — Other Ambulatory Visit

## 2024-09-03 ENCOUNTER — Ambulatory Visit

## 2024-09-03 ENCOUNTER — Other Ambulatory Visit

## 2024-09-03 ENCOUNTER — Ambulatory Visit: Admitting: Hematology
